# Patient Record
Sex: Female | Born: 1960
Health system: Southern US, Community
[De-identification: ages and names within clinical notes are randomized; demographics above are authoritative.]

## PROBLEM LIST (undated history)

## (undated) ENCOUNTER — Ambulatory Visit (HOSPITAL_COMMUNITY): Admission: EM | Payer: BLUE CROSS/BLUE SHIELD | Source: Home / Self Care

## (undated) ENCOUNTER — Ambulatory Visit (HOSPITAL_COMMUNITY): Discharge status: Absent without leave | Payer: BLUE CROSS/BLUE SHIELD

## (undated) DIAGNOSIS — I340 Nonrheumatic mitral (valve) insufficiency: Secondary | ICD-10-CM

## (undated) DIAGNOSIS — Z9119 Patient's noncompliance with other medical treatment and regimen: Secondary | ICD-10-CM

## (undated) DIAGNOSIS — I10 Essential (primary) hypertension: Secondary | ICD-10-CM

## (undated) DIAGNOSIS — Z91199 Patient's noncompliance with other medical treatment and regimen due to unspecified reason: Secondary | ICD-10-CM

## (undated) DIAGNOSIS — N6019 Diffuse cystic mastopathy of unspecified breast: Secondary | ICD-10-CM

## (undated) DIAGNOSIS — C9110 Chronic lymphocytic leukemia of B-cell type not having achieved remission: Secondary | ICD-10-CM

## (undated) DIAGNOSIS — D509 Iron deficiency anemia, unspecified: Secondary | ICD-10-CM

## (undated) DIAGNOSIS — I4819 Other persistent atrial fibrillation: Secondary | ICD-10-CM

## (undated) DIAGNOSIS — I4891 Unspecified atrial fibrillation: Secondary | ICD-10-CM

## (undated) DIAGNOSIS — E785 Hyperlipidemia, unspecified: Secondary | ICD-10-CM

## (undated) DIAGNOSIS — F32A Depression, unspecified: Secondary | ICD-10-CM

## (undated) DIAGNOSIS — F329 Major depressive disorder, single episode, unspecified: Secondary | ICD-10-CM

## (undated) DIAGNOSIS — R591 Generalized enlarged lymph nodes: Secondary | ICD-10-CM

## (undated) HISTORY — DX: Nonrheumatic mitral (valve) insufficiency: I34.0

## (undated) HISTORY — DX: Iron deficiency anemia, unspecified: D50.9

## (undated) HISTORY — DX: Diffuse cystic mastopathy of unspecified breast: N60.19

## (undated) HISTORY — DX: Patient's noncompliance with other medical treatment and regimen due to unspecified reason: Z91.199

## (undated) HISTORY — PX: TUBAL LIGATION: SHX77

## (undated) HISTORY — DX: Major depressive disorder, single episode, unspecified: F32.9

## (undated) HISTORY — DX: Depression, unspecified: F32.A

## (undated) HISTORY — DX: Generalized enlarged lymph nodes: R59.1

## (undated) HISTORY — DX: Hyperlipidemia, unspecified: E78.5

## (undated) HISTORY — DX: Patient's noncompliance with other medical treatment and regimen: Z91.19

---

## 1998-12-06 ENCOUNTER — Other Ambulatory Visit: Admission: RE | Admit: 1998-12-06 | Discharge: 1998-12-06 | Payer: Self-pay | Admitting: Obstetrics

## 1999-06-02 ENCOUNTER — Inpatient Hospital Stay (HOSPITAL_COMMUNITY): Admission: AD | Admit: 1999-06-02 | Discharge: 1999-06-04 | Payer: Self-pay | Admitting: Obstetrics

## 1999-07-08 ENCOUNTER — Ambulatory Visit (HOSPITAL_COMMUNITY): Admission: RE | Admit: 1999-07-08 | Discharge: 1999-07-08 | Payer: Self-pay | Admitting: Obstetrics

## 2004-01-18 ENCOUNTER — Inpatient Hospital Stay (HOSPITAL_COMMUNITY): Admission: AD | Admit: 2004-01-18 | Discharge: 2004-01-21 | Payer: Self-pay | Admitting: Obstetrics

## 2004-01-19 ENCOUNTER — Encounter (INDEPENDENT_AMBULATORY_CARE_PROVIDER_SITE_OTHER): Payer: Self-pay | Admitting: Specialist

## 2004-01-25 ENCOUNTER — Ambulatory Visit: Admission: RE | Admit: 2004-01-25 | Discharge: 2004-01-25 | Payer: Self-pay | Admitting: Obstetrics

## 2006-02-05 ENCOUNTER — Emergency Department (HOSPITAL_COMMUNITY): Admission: EM | Admit: 2006-02-05 | Discharge: 2006-02-05 | Payer: Self-pay | Admitting: Family Medicine

## 2006-08-25 ENCOUNTER — Encounter (HOSPITAL_BASED_OUTPATIENT_CLINIC_OR_DEPARTMENT_OTHER): Admission: RE | Admit: 2006-08-25 | Discharge: 2006-09-21 | Payer: Self-pay | Admitting: Surgery

## 2008-11-24 ENCOUNTER — Ambulatory Visit: Payer: Self-pay | Admitting: Oncology

## 2009-01-12 ENCOUNTER — Ambulatory Visit: Payer: Self-pay | Admitting: Oncology

## 2009-01-12 LAB — CBC & DIFF AND RETIC
BASO%: 0.5 % (ref 0.0–2.0)
HCT: 31.4 % — ABNORMAL LOW (ref 34.8–46.6)
HGB: 10.3 g/dL — ABNORMAL LOW (ref 11.6–15.9)
IRF: 0.3 (ref 0.130–0.330)
MCHC: 32.8 g/dL (ref 31.5–36.0)
MONO#: 0.5 10*3/uL (ref 0.1–0.9)
NEUT#: 3 10*3/uL (ref 1.5–6.5)
RBC: 4.07 10*6/uL (ref 3.70–5.45)
RDW: 27.2 % — ABNORMAL HIGH (ref 11.2–14.5)
Retic %: 1.4 % (ref 0.4–2.3)
lymph#: 7.5 10*3/uL — ABNORMAL HIGH (ref 0.9–3.3)

## 2009-01-12 LAB — COMPREHENSIVE METABOLIC PANEL
ALT: 13 U/L (ref 0–35)
AST: 18 U/L (ref 0–37)
Alkaline Phosphatase: 39 U/L (ref 39–117)
Calcium: 9 mg/dL (ref 8.4–10.5)
Chloride: 102 mEq/L (ref 96–112)
Creatinine, Ser: 0.64 mg/dL (ref 0.40–1.20)
Total Bilirubin: 0.6 mg/dL (ref 0.3–1.2)

## 2009-01-12 LAB — LACTATE DEHYDROGENASE: LDH: 117 U/L (ref 94–250)

## 2009-01-15 LAB — TRANSFERRIN RECEPTOR, SOLUABLE: Transferrin Receptor, Soluble: 45.6 nmol/L

## 2009-01-15 LAB — IRON AND TIBC
%SAT: 19 % — ABNORMAL LOW (ref 20–55)
TIBC: 309 ug/dL (ref 250–470)
UIBC: 250 ug/dL

## 2009-02-16 LAB — CBC WITH DIFFERENTIAL/PLATELET
BASO%: 0.5 % (ref 0.0–2.0)
Basophils Absolute: 0.1 10*3/uL (ref 0.0–0.1)
EOS%: 1.2 % (ref 0.0–7.0)
Eosinophils Absolute: 0.1 10*3/uL (ref 0.0–0.5)
HCT: 33.6 % — ABNORMAL LOW (ref 34.8–46.6)
HGB: 11.2 g/dL — ABNORMAL LOW (ref 11.6–15.9)
LYMPH%: 67.3 % — ABNORMAL HIGH (ref 14.0–49.7)
MCH: 27.8 pg (ref 25.1–34.0)
MCHC: 33.3 g/dL (ref 31.5–36.0)
MCV: 83.4 fL (ref 79.5–101.0)
MONO#: 0.6 10*3/uL (ref 0.1–0.9)
MONO%: 4.8 % (ref 0.0–14.0)
NEUT#: 3.1 10*3/uL (ref 1.5–6.5)
NEUT%: 26.2 % — ABNORMAL LOW (ref 38.4–76.8)
Platelets: 271 10*3/uL (ref 145–400)
RBC: 4.03 10*6/uL (ref 3.70–5.45)
RDW: 19.3 % — ABNORMAL HIGH (ref 11.2–14.5)
WBC: 11.8 10*3/uL — ABNORMAL HIGH (ref 3.9–10.3)
lymph#: 7.9 10*3/uL — ABNORMAL HIGH (ref 0.9–3.3)

## 2009-03-14 ENCOUNTER — Ambulatory Visit: Payer: Self-pay | Admitting: Oncology

## 2009-03-16 LAB — CBC WITH DIFFERENTIAL/PLATELET
BASO%: 0.5 % (ref 0.0–2.0)
Eosinophils Absolute: 0.1 10*3/uL (ref 0.0–0.5)
HCT: 34.7 % — ABNORMAL LOW (ref 34.8–46.6)
LYMPH%: 62.6 % — ABNORMAL HIGH (ref 14.0–49.7)
MONO#: 1.6 10*3/uL — ABNORMAL HIGH (ref 0.1–0.9)
NEUT#: 2.9 10*3/uL (ref 1.5–6.5)
NEUT%: 23 % — ABNORMAL LOW (ref 38.4–76.8)
Platelets: 239 10*3/uL (ref 145–400)
WBC: 12.5 10*3/uL — ABNORMAL HIGH (ref 3.9–10.3)
lymph#: 7.8 10*3/uL — ABNORMAL HIGH (ref 0.9–3.3)

## 2009-04-13 ENCOUNTER — Ambulatory Visit: Payer: Self-pay | Admitting: Oncology

## 2009-04-13 LAB — CBC WITH DIFFERENTIAL/PLATELET
BASO%: 0.5 % (ref 0.0–2.0)
EOS%: 1 % (ref 0.0–7.0)
Eosinophils Absolute: 0.1 10*3/uL (ref 0.0–0.5)
LYMPH%: 69.9 % — ABNORMAL HIGH (ref 14.0–49.7)
MCH: 29.8 pg (ref 25.1–34.0)
MCHC: 34.3 g/dL (ref 31.5–36.0)
MCV: 86.8 fL (ref 79.5–101.0)
MONO%: 2.1 % (ref 0.0–14.0)
Platelets: 274 10*3/uL (ref 145–400)
RBC: 3.91 10*6/uL (ref 3.70–5.45)
RDW: 13.5 % (ref 11.2–14.5)

## 2009-04-13 LAB — COMPREHENSIVE METABOLIC PANEL
AST: 18 U/L (ref 0–37)
Alkaline Phosphatase: 30 U/L — ABNORMAL LOW (ref 39–117)
Glucose, Bld: 99 mg/dL (ref 70–99)
Potassium: 3.3 mEq/L — ABNORMAL LOW (ref 3.5–5.3)
Sodium: 135 mEq/L (ref 135–145)
Total Bilirubin: 0.9 mg/dL (ref 0.3–1.2)
Total Protein: 7.1 g/dL (ref 6.0–8.3)

## 2009-04-13 LAB — FERRITIN: Ferritin: 28 ng/mL (ref 10–291)

## 2009-04-13 LAB — IRON AND TIBC: %SAT: 24 % (ref 20–55)

## 2009-06-12 ENCOUNTER — Ambulatory Visit: Payer: Self-pay | Admitting: Oncology

## 2009-06-14 LAB — CBC WITH DIFFERENTIAL/PLATELET
Basophils Absolute: 0.1 10*3/uL (ref 0.0–0.1)
EOS%: 0.8 % (ref 0.0–7.0)
Eosinophils Absolute: 0.1 10*3/uL (ref 0.0–0.5)
HGB: 11 g/dL — ABNORMAL LOW (ref 11.6–15.9)
LYMPH%: 66.4 % — ABNORMAL HIGH (ref 14.0–49.7)
MCH: 29.7 pg (ref 25.1–34.0)
MCV: 88.5 fL (ref 79.5–101.0)
MONO%: 4.6 % (ref 0.0–14.0)
NEUT#: 3.8 10*3/uL (ref 1.5–6.5)
NEUT%: 27.7 % — ABNORMAL LOW (ref 38.4–76.8)
Platelets: 273 10*3/uL (ref 145–400)
RDW: 13.4 % (ref 11.2–14.5)

## 2009-08-10 ENCOUNTER — Ambulatory Visit: Payer: Self-pay | Admitting: Oncology

## 2009-08-14 LAB — LACTATE DEHYDROGENASE: LDH: 161 U/L (ref 94–250)

## 2009-08-14 LAB — CBC WITH DIFFERENTIAL/PLATELET
Basophils Absolute: 0.1 10*3/uL (ref 0.0–0.1)
Eosinophils Absolute: 0.1 10*3/uL (ref 0.0–0.5)
HCT: 39 % (ref 34.8–46.6)
HGB: 13 g/dL (ref 11.6–15.9)
LYMPH%: 75.4 % — ABNORMAL HIGH (ref 14.0–49.7)
MCV: 87.7 fL (ref 79.5–101.0)
MONO#: 0.7 10*3/uL (ref 0.1–0.9)
MONO%: 3.9 % (ref 0.0–14.0)
NEUT#: 3.7 10*3/uL (ref 1.5–6.5)
Platelets: 313 10*3/uL (ref 145–400)
WBC: 18.9 10*3/uL — ABNORMAL HIGH (ref 3.9–10.3)

## 2009-08-14 LAB — COMPREHENSIVE METABOLIC PANEL
Albumin: 4.3 g/dL (ref 3.5–5.2)
Alkaline Phosphatase: 36 U/L — ABNORMAL LOW (ref 39–117)
BUN: 10 mg/dL (ref 6–23)
CO2: 25 mEq/L (ref 19–32)
Glucose, Bld: 99 mg/dL (ref 70–99)
Total Bilirubin: 0.7 mg/dL (ref 0.3–1.2)
Total Protein: 7.5 g/dL (ref 6.0–8.3)

## 2009-08-15 LAB — IRON AND TIBC
%SAT: 27 % (ref 20–55)
Iron: 84 ug/dL (ref 42–145)
TIBC: 315 ug/dL (ref 250–470)

## 2009-08-15 LAB — FERRITIN: Ferritin: 20 ng/mL (ref 10–291)

## 2009-10-11 ENCOUNTER — Ambulatory Visit: Payer: Self-pay | Admitting: Oncology

## 2009-10-15 LAB — CBC WITH DIFFERENTIAL/PLATELET
Basophils Absolute: 0.1 10*3/uL (ref 0.0–0.1)
Eosinophils Absolute: 0.1 10*3/uL (ref 0.0–0.5)
HCT: 34.4 % — ABNORMAL LOW (ref 34.8–46.6)
HGB: 11.3 g/dL — ABNORMAL LOW (ref 11.6–15.9)
LYMPH%: 74.4 % — ABNORMAL HIGH (ref 14.0–49.7)
MCV: 88.7 fL (ref 79.5–101.0)
MONO#: 0.5 10*3/uL (ref 0.1–0.9)
MONO%: 3.2 % (ref 0.0–14.0)
NEUT#: 3.3 10*3/uL (ref 1.5–6.5)
Platelets: 285 10*3/uL (ref 145–400)
RBC: 3.88 10*6/uL (ref 3.70–5.45)
WBC: 15.7 10*3/uL — ABNORMAL HIGH (ref 3.9–10.3)

## 2009-10-15 LAB — TECHNOLOGIST REVIEW

## 2009-10-15 LAB — BASIC METABOLIC PANEL
CO2: 27 mEq/L (ref 19–32)
Chloride: 107 mEq/L (ref 96–112)
Creatinine, Ser: 0.72 mg/dL (ref 0.40–1.20)
Glucose, Bld: 111 mg/dL — ABNORMAL HIGH (ref 70–99)

## 2009-12-11 ENCOUNTER — Ambulatory Visit: Payer: Self-pay | Admitting: Oncology

## 2009-12-13 LAB — CBC WITH DIFFERENTIAL/PLATELET
BASO%: 2.9 % — ABNORMAL HIGH (ref 0.0–2.0)
Basophils Absolute: 0.6 10*3/uL — ABNORMAL HIGH (ref 0.0–0.1)
EOS%: 0.5 % (ref 0.0–7.0)
HCT: 36 % (ref 34.8–46.6)
HGB: 11.9 g/dL (ref 11.6–15.9)
MCH: 29.1 pg (ref 25.1–34.0)
MCHC: 33.1 g/dL (ref 31.5–36.0)
MCV: 88 fL (ref 79.5–101.0)
MONO%: 3.1 % (ref 0.0–14.0)
NEUT%: 15.6 % — ABNORMAL LOW (ref 38.4–76.8)
RDW: 13.8 % (ref 11.2–14.5)

## 2009-12-14 LAB — COMPREHENSIVE METABOLIC PANEL
AST: 15 U/L (ref 0–37)
Alkaline Phosphatase: 39 U/L (ref 39–117)
BUN: 9 mg/dL (ref 6–23)
Creatinine, Ser: 0.71 mg/dL (ref 0.40–1.20)

## 2009-12-14 LAB — IRON AND TIBC
%SAT: 16 % — ABNORMAL LOW (ref 20–55)
Iron: 55 ug/dL (ref 42–145)
TIBC: 350 ug/dL (ref 250–470)
UIBC: 295 ug/dL

## 2009-12-14 LAB — TRANSFERRIN RECEPTOR, SOLUABLE: Transferrin Receptor, Soluble: 53.5 nmol/L

## 2010-06-11 ENCOUNTER — Ambulatory Visit: Payer: Self-pay | Admitting: Oncology

## 2011-02-21 NOTE — Op Note (Signed)
NAME:  Audrey Gross, Audrey Gross                           ACCOUNT NO.:  [id number]   MEDICAL RECORD NO.:  [id number]                   PATIENT TYPE:  INP   LOCATION:  9371                                 FACILITY:  [facility]   PHYSICIAN:  Kathreen Cosier, M.D.           DATE OF BIRTH:  09-19-1961   DATE OF PROCEDURE:  01/19/2004  DATE OF DISCHARGE:                                 OPERATIVE REPORT   PREOPERATIVE DIAGNOSIS:  Multiparity.   PROCEDURE:  Postpartum tubal ligation.   ANESTHESIA:  General anesthesia.   SURGEON:  Kathreen Cosier, M.D.   DESCRIPTION OF PROCEDURE:  Under general anesthesia, patient in the supine  position, abdomen prepped and draped, bladder emptied with a straight  catheter.  Midline subumbilical incision made and carried down to the  fascia.  Fascia cleaned and grasped with two Kochers in the fascia and the  peritoneum opened with Mayo scissors.  Left tube grasped in the mid portion  with Babcock clamp.  A 0 plain suture placed in the mesosalpinx below the  portion of tube in the clamp.  This was tied and approximately one inch of  tube transected.  Hemostasis was satisfactory.  Procedure done in similar  fashion on the other side.  The abdomen closed in layers.  Peritoneum and  fascia continuous suture of 0 Dexon and the skin closed with subcuticular  suture of 3-0 plain.  Blood loss less than 5 mL.                                               Kathreen Cosier, M.D.    BAM/MEDQ  D:  01/19/2004  T:  01/22/2004  Job:  [id number]

## 2011-02-21 NOTE — Assessment & Plan Note (Signed)
[facility]   [name], Audrey Gross NO.:  [id number]   MEDICAL RECORD NO.:  [id number]          DATE OF BIRTH:   PHYSICIAN:  Theresia Majors. Tanda Rockers, M.D. VISIT DATE:  09/08/2006                                   OFFICE VISIT   VITAL SIGNS:  Blood pressure is 130/80, respirations 20, pulse rate 76  and she is afebrile.   PURPOSE OF TODAY'S VISIT:  [name] is a 50 year old lady who  represents for followup a left elbow ulceration associated with a MRSA  infection.  In the interim, the patient has been treated with topical  antiseptics and serial exams.  In the interim, she reports there has  been no fever, no cellulitis and no tenderness.   WOUND EXAM:  Inspection of the wound shows that there is 99%  reepithelization.  The wound was cleansed with topical Anasept solution.   DIAGNOSIS:  Resolved wound.   MANAGEMENT PLAN & GOAL:  We are discharging the patient.  Her interim  cultures showed no growth at 2 days.  We have instructed her in the use  of antiseptic soaps and the application of interim dry dressing.  If she  develops progressive drainage, pain or fever, she will call the wound  center for a priority evaluation; otherwise, we anticipate complete  resolution.  We are, therefore, discharging her from the clinic.           ______________________________  Theresia Majors. Tanda Rockers, M.D.     [name]  D:  09/08/2006  T:  09/09/2006  Job:  [id number]   cc:   [name]

## 2011-02-21 NOTE — Discharge Summary (Signed)
NAME:  Audrey Gross, Audrey Gross                           ACCOUNT NO.:  [id number]   MEDICAL RECORD NO.:  [id number]                   PATIENT TYPE:  INP   LOCATION:  9143                                 FACILITY:  [facility]   PHYSICIAN:  Kathreen Cosier, M.D.           DATE OF BIRTH:  [birth year]   DATE OF ADMISSION:  01/18/2004  DATE OF DISCHARGE:  01/21/2004                                 DISCHARGE SUMMARY   HOSPITAL COURSE:  The patient is a 50 year old gravida 6 para 5-0-0-5 with  EDC of Feb 13, 2004 who was seen for a regular visit in the office on January 18, 2004 and her blood pressure ranged between 160/90 to 197/98.  Her uric  acid was 5.8, platelets 300+.  She was brought in for induction because of  PIH.  Her cervix was 1 cm, 80%, vertex, -2 to -3.  She was started on  magnesium sulfate 4 g loading, 2 g per hour.  She had a positive GBS which  was treated with penicillin.  The patient received Pitocin induction and had  a normal spontaneous delivery of a female, Apgars 9 and 9.  The placenta was  spontaneous, intact, with a nuchal cord x1.  The patient was kept on  magnesium sulfate postpartum for 24 hours and her uric acid fell to 5.3.  She desired sterilization and on April 5 underwent postpartum tubal  ligation.  Postoperative delivery she was started on hydrochlorothiazide on  day #2 50 mg p.o. daily and did well.  On the morning of admission blood  pressure was 150/88, fundus firm, legs negative, no complaints.  She was  discharged on Tylenol No. 3 and hydrochlorothiazide 50 mg p.o. daily in  a.m., to see me on Tuesday morning after 9 a.m. for blood pressure check.   DISCHARGE DIAGNOSES:  1. Status post pregnancy-induced hypertension.  2. Thirty-six-weeks induction of labor.  3. Normal vaginal delivery.  4. Postpartum tubal ligation.                                               Kathreen Cosier, M.D.    BAM/MEDQ  D:  01/21/2004  T:  01/22/2004  Job:  [id number]

## 2011-10-09 ENCOUNTER — Emergency Department (INDEPENDENT_AMBULATORY_CARE_PROVIDER_SITE_OTHER)
Admission: EM | Admit: 2011-10-09 | Discharge: 2011-10-09 | Disposition: A | Discharge status: Home / Self Care | Payer: BC Managed Care – PPO | Source: Home / Self Care | Attending: Emergency Medicine | Admitting: Emergency Medicine

## 2011-10-09 ENCOUNTER — Encounter: Payer: Self-pay | Admitting: *Deleted

## 2011-10-09 DIAGNOSIS — N63 Unspecified lump in unspecified breast: Secondary | ICD-10-CM

## 2011-10-09 HISTORY — DX: Chronic lymphocytic leukemia of B-cell type not having achieved remission: C91.10

## 2011-10-09 HISTORY — DX: Essential (primary) hypertension: I10

## 2011-10-09 NOTE — ED Provider Notes (Signed)
History     CSN: [id number]  Arrival date & time 10/09/11  7829   First MD Initiated Contact with Patient 10/09/11 1001      Chief Complaint  Patient presents with  . Breast Mass    (Consider location/radiation/quality/duration/timing/severity/associated sxs/prior treatment) HPI Comments: Audrey Gross discovered a lump in her left breast to 3 days ago. This was nontender. There was no discharge from the nipple. She's not had any prior history of breast lumps or cysts. She denies any personal or family history of breast cancer and she does have chronic lymphocytic leukemia and high blood pressure.   Past Medical History  Diagnosis Date  . Hypertension   . CLL (chronic lymphoblastic leukemia)     History reviewed. No pertinent past surgical history.  History reviewed. No pertinent family history.  History  Substance Use Topics  . Smoking status: Not on file  . Smokeless tobacco: Not on file  . Alcohol Use:     OB History    Grav Para Term Preterm Abortions TAB SAB Ect Mult Living                  Review of Systems  Constitutional: Negative for fever and chills.  Skin: Negative for color change and rash.    Allergies  Review of patient's allergies indicates not on file.  Home Medications   Current Outpatient Rx  Name Route Sig Dispense Refill  . LISINOPRIL-HYDROCHLOROTHIAZIDE 20-12.5 MG PO TABS Oral Take 1 tablet by mouth daily.        BP 203/93  Pulse 82  Temp(Src) 98.5 F (36.9 C) (Oral)  Resp 18  SpO2 100%  Physical Exam  Constitutional: She appears well-developed and well-nourished.  Cardiovascular: Normal rate, regular rhythm, normal heart sounds and intact distal pulses.   Pulmonary/Chest: Effort normal and breath sounds normal.       Breast exam reveals a 3 x 4 cm mass at the 12:00 position in the left breast. This is freely movable. There is no overlying skin changes, no nipple discharge or bleeding. No axillary lymphadenopathy.  Abdominal: Soft. Bowel  sounds are normal.  Skin: Skin is warm and dry. No rash noted.    ED Course  Procedures (including critical care time)  Patient needs to have an immediate nonstick mammogram. The [facility] was willing to do this today, but they need a written authorization from a physician, so this was supplied to the patient. She will go there and have this done. They will take care of further followup thereafter.  Labs Reviewed - No data to display No results found.   1. Breast lump       MDM  She has a breast lump of uncertain significance. This may be a cyst. She will have a diagnostic mammogram done today.        Roque Lias, MD 10/09/11 1340

## 2011-10-09 NOTE — ED Notes (Signed)
Pt  Noticed  A  Lump  Top  Of  l  Breast  2-3  Days  Ago    She  Reports  It is  Not  painfull        She  denys  Any other  Symptoms

## 2011-10-16 ENCOUNTER — Encounter: Payer: Self-pay | Admitting: Oncology

## 2011-11-18 ENCOUNTER — Telehealth: Payer: Self-pay | Admitting: Oncology

## 2011-11-18 NOTE — Telephone Encounter (Signed)
pt had lm to r/s 06/2010 appt,called and lm with a person and they will have her c/b  aom

## 2011-11-20 ENCOUNTER — Other Ambulatory Visit: Payer: Self-pay

## 2011-11-20 ENCOUNTER — Telehealth: Payer: Self-pay | Admitting: Oncology

## 2011-11-20 ENCOUNTER — Telehealth: Payer: Self-pay

## 2011-11-20 DIAGNOSIS — D649 Anemia, unspecified: Secondary | ICD-10-CM

## 2011-11-20 DIAGNOSIS — C9110 Chronic lymphocytic leukemia of B-cell type not having achieved remission: Secondary | ICD-10-CM

## 2011-11-20 NOTE — Telephone Encounter (Signed)
l/m with 01/05/12 appt per kb      aom

## 2011-11-20 NOTE — Telephone Encounter (Signed)
Pt called to schedule appt w/DSM.  "I need to get back on track" She saw an MD at [facility] that had diagnosed a cyst in her breast. Report from [facility] obtained and forwarded to DSM. POF sent to scheduler for early April appt.

## 2012-01-05 ENCOUNTER — Ambulatory Visit (HOSPITAL_BASED_OUTPATIENT_CLINIC_OR_DEPARTMENT_OTHER): Payer: BC Managed Care – PPO

## 2012-01-05 ENCOUNTER — Other Ambulatory Visit: Payer: BC Managed Care – PPO | Admitting: Lab

## 2012-01-05 ENCOUNTER — Telehealth: Payer: Self-pay | Admitting: Oncology

## 2012-01-05 ENCOUNTER — Encounter: Payer: Self-pay | Admitting: Gastroenterology

## 2012-01-05 ENCOUNTER — Encounter: Payer: Self-pay | Admitting: Oncology

## 2012-01-05 ENCOUNTER — Ambulatory Visit: Payer: BC Managed Care – PPO | Admitting: Oncology

## 2012-01-05 VITALS — BP 164/92 | HR 74 | Temp 97.9°F | Ht 67.5 in | Wt 162.6 lb

## 2012-01-05 DIAGNOSIS — C9110 Chronic lymphocytic leukemia of B-cell type not having achieved remission: Secondary | ICD-10-CM

## 2012-01-05 DIAGNOSIS — F329 Major depressive disorder, single episode, unspecified: Secondary | ICD-10-CM

## 2012-01-05 DIAGNOSIS — D649 Anemia, unspecified: Secondary | ICD-10-CM

## 2012-01-05 DIAGNOSIS — N6019 Diffuse cystic mastopathy of unspecified breast: Secondary | ICD-10-CM

## 2012-01-05 DIAGNOSIS — D509 Iron deficiency anemia, unspecified: Secondary | ICD-10-CM | POA: Insufficient documentation

## 2012-01-05 DIAGNOSIS — D3A020 Benign carcinoid tumor of the appendix: Secondary | ICD-10-CM

## 2012-01-05 LAB — CBC WITH DIFFERENTIAL/PLATELET
BASO%: 0.8 % (ref 0.0–2.0)
LYMPH%: 76.8 % — ABNORMAL HIGH (ref 14.0–49.7)
MCHC: 29.8 g/dL — ABNORMAL LOW (ref 31.5–36.0)
MONO#: 0.5 10*3/uL (ref 0.1–0.9)
RBC: 4.08 10*6/uL (ref 3.70–5.45)
RDW: 21.1 % — ABNORMAL HIGH (ref 11.2–14.5)
WBC: 18.6 10*3/uL — ABNORMAL HIGH (ref 3.9–10.3)
lymph#: 14.3 10*3/uL — ABNORMAL HIGH (ref 0.9–3.3)
nRBC: 0 % (ref 0–0)

## 2012-01-05 MED ORDER — SODIUM CHLORIDE 0.9 % IV SOLN
Freq: Once | INTRAVENOUS | Status: AC
  Administered 2012-01-05: 100 mL via INTRAVENOUS

## 2012-01-05 MED ORDER — FERUMOXYTOL INJECTION 510 MG/17 ML
510.0000 mg | Freq: Once | INTRAVENOUS | Status: AC
  Administered 2012-01-05: 510 mg via INTRAVENOUS
  Filled 2012-01-05: qty 17

## 2012-01-05 MED ORDER — SODIUM CHLORIDE 0.9 % IJ SOLN
10.0000 mL | INTRAMUSCULAR | Status: DC | PRN
  Filled 2012-01-05: qty 10

## 2012-01-05 NOTE — Telephone Encounter (Signed)
Gv pt appt for [id number].  scheduled appt with Dr. Christella Hartigan for 01/28/2012 @ 10:30am @ [facility]

## 2012-01-05 NOTE — Progress Notes (Signed)
CC:   [facility]  PROBLEM LIST: 1. Chronic lymphocytic leukemia first diagnosed in February 2010,     stage 0.  No prior history of treatment. 2. Iron-deficiency anemia which the patient states she has had for     years.  When we first saw the patient 3 years ago in April 2010,     her ferritin was 3, her hemoglobin was 10.3, and she responded to     oral iron. 3. Long history of hypertension. 4. History of breast cysts.  The patient had a mammogram at [facility] on 10/09/2011 with apparently a cyst aspiration.  I     cannot find a cytology for that aspiration. 5. Recent history of depression. 6. Systolic ejection murmur. 7. Poor medical compliance.  MEDICATIONS:  Zestoretic 20/12.5 mg daily.  Compliance on this medicine is questionable.  HISTORY:  Audrey Gross is a 51 year old African American female who is here today with her husband Audrey Gross.  The patient was first seen by [place] on 01/12/2009 as a referral for chronic lymphocytic leukemia and iron- deficiency anemia.  I should mention that she did have the flow studies carried out on the peripheral blood when we first saw her.  C5, CD20, and CD23 were positive.  CD10 was negative.  Stools at that time were negative.  The patient had pagophagia.  The patient was last seen by [place] 2 years ago on December 13, 2009.  She was supposed to come back and see [place] in 6 months but was lost to our followup.  Somehow, the patient has reappeared.  Her major complaint is feeling very depressed.  She describes her feelings as having feelings of hopeless.  She feels that she has been depressed for a couple years, but these feelings are getting worse.  She is tired.  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  The patient is not on iron.  She denies any obvious blood loss.  Stools are apparently dark from time to time.  There is no obvious blood.  Her periods in the past have been described as normal.   She said she has not had a period in the last couple of months.  There has been no obvious bleeding, blood loss, or blood transfusions.  She says she has been off and on iron for a while.  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  PHYSICAL EXAM:  General:  The patient has very flat affect, but at times does smile.  Vital Signs:  Weight is 162.6 pounds, height 5 feet 7-1/2 inches, body surface area 1.87 sq m.  Blood pressure today 164/92.  The patient states she did take her medicine.  It is not clear to me how she is getting this refilled.  Originally, she had seen Jessica Copland.  She apparently had seen Dr. Creola Corn but says that he is not on her preferred provider list.  I do not believe she has seen any other doctor for quite some time.  Other vital signs are normal.  HEENT: There is no scleral icterus.  Mouth and pharynx benign.  Lymphatic:  No peripheral adenopathy palpable in the neck, supraclavicular, axillary, or inguinal areas.  Lungs:  Clear to percussion and auscultation. Cardiac Exam:  Regular rhythm with systolic ejection murmur which we had noted previously.  I offered to examine the patient's breasts in view of the breast  cysts; however, she declined.  She said she will seek followup at [facility].  Abdomen:  Benign with no organomegaly or masses palpable.  She has had a tubal ligation in the past. Extremities:  No peripheral edema or clubbing, palmar erythema, petechiae, or purpura.  Neurologic Exam:  Grossly normal.  LABORATORY DATA:  (Which was given to the patient and her husband): White count 18.6 which is stable, ANC 3.4, hemoglobin 8.1, hematocrit 27.3, platelets 339,000.  MCV 66.9, MCH 19.9.  On 12/13/2009, the patient's last visit here, white count was 19.4, ANC 3.0, hemoglobin 11.9, hematocrit 36.0, platelets 318,000.  MCV and MCH were 88.0 and 29.1 respectively.  Differential today:  18% neutrophils  77% lymphocytes.  Chemistries and iron studies today are pending. Chemistries from 12/13/2009 were normal including an LDH of 204, albumin 4.8.  Ferritin on 12/13/2009 was 9 as compared with 20 back on 08/14/2009.  The patient apparently had never received intravenous iron before.  IMAGING STUDIES:  The only imaging study that we have available is mammogram from 10/09/2011 which reads as follows:  Breast tissue is heterogeneously dense.  There is a palpable area in the superior left breast that was marked with a metallic BB.  Multiple bilateral benign- looking obscured masses were seen.  There was scattered benign-looking calcifications seen bilaterally more on the right.  No suspicious calcifications or architectural distortion.  On ultrasound, the concerning area which is located at the 12 o'clock position in the left breast.  There were 2 adjacent simple cysts seen measuring 3.5 cm in maximum diameter.  Multiple cysts were seen in both breasts.  There were no suspicions looking masses, abnormal shadowing, or other features suggestive for malignancy and a yearly mammogram was recommended.  It does appear that the patient underwent a cyst aspiration on 10/09/2011; however, I do not see a pathology report for that.  IMPRESSION AND PLAN: 1. The patient has several problems as outlined above.  The patient     has requested intravenous iron.  She will receive IV Feraheme today     510 mg IV.  I have suggested that she start taking iron in the form     of ferrous sulfate 2-3 daily and continue that until we see her     again which will be 2 months from now.  At that time, we will check     CBC, chemistries, and iron studies. 2. Referral to [facility] will be made in view of the patient's     recurrent iron deficiency and lack of good explanation for this.     The patient does not give a convincing history for menstrual     bleeding.  In addition, she is 13 and has never had a  colonoscopy. 3. We are trying to address the patient's depression and try to make     an [facility]  She may need to see a psychiatrist or internist for     antidepressant medicines and followup. 4. In addition, the patient needs followup for her hypertension.  Her     blood pressure today was elevated 164/92 and the last time she was     seen 2 years ago her blood pressure was 194/99.          Today's visit was rather extended and addressed several problems.  DURATION FOR VISIT:  50 minutes.    ______________________________ Gerarda Fraction  Marcellous Snarski, M.D. DSM/MEDQ  D:  01/05/2012  T:  01/05/2012  Job:  [id number]

## 2012-01-05 NOTE — Progress Notes (Signed)
This office note has been dictated.  #[id number]

## 2012-01-12 LAB — COMPREHENSIVE METABOLIC PANEL
ALT: 12 U/L (ref 0–35)
AST: 15 U/L (ref 0–37)
Calcium: 9.6 mg/dL (ref 8.4–10.5)
Chloride: 107 mEq/L (ref 96–112)
Creatinine, Ser: 0.82 mg/dL (ref 0.50–1.10)
Potassium: 3.9 mEq/L (ref 3.5–5.3)
Sodium: 138 mEq/L (ref 135–145)

## 2012-01-12 LAB — IRON AND TIBC: %SAT: 5 % — ABNORMAL LOW (ref 20–55)

## 2012-01-15 ENCOUNTER — Ambulatory Visit (INDEPENDENT_AMBULATORY_CARE_PROVIDER_SITE_OTHER): Payer: BC Managed Care – PPO | Admitting: Psychiatry

## 2012-01-15 DIAGNOSIS — F0630 Mood disorder due to known physiological condition, unspecified: Secondary | ICD-10-CM

## 2012-01-15 NOTE — Progress Notes (Incomplete)
01-14-2012  Patient seen for initial psychological evaluation.  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  She also is struggling with marital issues and possible divorce.  Recommended the patient consider antidepressants to help her mood and suggested she discuss her condition more fully with Dr. Arline Asp because she has gained information on the internet that causes her to believe her situation is hopeless.  Will consult with Dr. Arline Asp.  Patient's next appointment is 01-27-2012.

## 2012-01-27 ENCOUNTER — Ambulatory Visit (INDEPENDENT_AMBULATORY_CARE_PROVIDER_SITE_OTHER): Payer: BC Managed Care – PPO | Admitting: Psychiatry

## 2012-01-27 DIAGNOSIS — F0630 Mood disorder due to known physiological condition, unspecified: Secondary | ICD-10-CM

## 2012-01-27 NOTE — Progress Notes (Signed)
01-27-2012  Patient seen for individual psychotherapy.  Patient was re-assured about her prognosis per Dr. Mamie Levers opinion that she was in a "wait and see" stage of leukemia and in no immediate risk of demise.  Patient continues to struggle with this optimism.  Encouraged her to utilize the eight coping strategies and focus on caring for self to increase her longevity.  Patient struggling with possible end of marriage issues.  Next appointment 02-17-2012.

## 2012-01-28 ENCOUNTER — Encounter: Payer: Self-pay | Admitting: Gastroenterology

## 2012-01-28 ENCOUNTER — Ambulatory Visit (INDEPENDENT_AMBULATORY_CARE_PROVIDER_SITE_OTHER): Payer: BC Managed Care – PPO | Admitting: Gastroenterology

## 2012-01-28 ENCOUNTER — Other Ambulatory Visit: Payer: BC Managed Care – PPO

## 2012-01-28 VITALS — BP 182/100 | HR 80 | Ht 67.0 in | Wt 167.0 lb

## 2012-01-28 DIAGNOSIS — D509 Iron deficiency anemia, unspecified: Secondary | ICD-10-CM

## 2012-01-28 NOTE — Progress Notes (Signed)
HPI: This is a   very pleasant 51 year old woman who was sent for the evaluation of iron deficiency anemia.  She is not a vegetarian.  Iron level low for a long time, many times.  SHe still has periods, "heavy."  Changes pad every 2 hours for 3 days. Has 6-7 days of bleeding.   This is her pattern of menstral bleeding her whole life.    She has not seen gyne in years.  No overt gi bleeding.  She has never had colon cancer screening.  No colon cancer in family but aunt and a grandmother both had stomach cancer.  Loss of weight recently, but she is gaining it back.   Blood work last month: Ferritin 3, hemoglobin 8.8 with MCV in the 60s. Iron level low.   Review of systems: Pertinent positive and negative review of systems were noted in the above HPI section. Complete review of systems was performed and was otherwise normal.    Past Medical History  Diagnosis Date  . Hypertension   . CLL (chronic lymphoblastic leukemia)   . Depression   . Iron deficiency anemia, unspecified   . Diffuse cystic mastopathy   . Cystic breast   . Systolic ejection murmur   . Medical non-compliance     Past Surgical History  Procedure Date  . Tubal ligation     Current Outpatient Prescriptions  Medication Sig Dispense Refill  . ferrous sulfate 325 (65 FE) MG tablet Take 325 mg by mouth daily with breakfast.      . lisinopril-hydrochlorothiazide (PRINZIDE,ZESTORETIC) 20-12.5 MG per tablet Take 1 tablet by mouth daily.          Allergies as of 01/28/2012  . (No Known Allergies)    Family History  Problem Relation Age of Onset  . Kidney disease Father   . Stomach cancer Maternal Aunt     History   Social History  . Marital Status: Legally Separated    Spouse Name: N/A    Number of Children: 6  . Years of Education: N/A   Occupational History  . Inspector    Social History Main Topics  . Smoking status: Never Smoker   . Smokeless tobacco: Never Used  . Alcohol Use: No  .  Drug Use: No  . Sexually Active: Not on file   Other Topics Concern  . Not on file   Social History Narrative  . No narrative on file       Physical Exam: BP 182/100  Pulse 80  Ht 5\' 7"  (1.702 m)  Wt 167 lb (75.751 kg)  BMI 26.16 kg/m2  LMP 01/12/2012 Constitutional: generally well-appearing Psychiatric: alert and oriented x3 Eyes: extraocular movements intact Mouth: oral pharynx moist, no lesions Neck: supple no lymphadenopathy Cardiovascular: heart regular rate and rhythm Lungs: clear to auscultation bilaterally Abdomen: soft, nontender, nondistended, no obvious ascites, no peritoneal signs, normal bowel sounds Extremities: no lower extremity edema bilaterally Skin: no lesions on visible extremities    Assessment and plan: 51 y.o. female with  iron deficiency anemia, heavy menstrual bleeding  Her heavy menstrual bleeding can certainly cause her chronic iron deficiency anemia. She has never had colon cancer screening and we will arrange a colonoscopy at her soonest convenience. She is going to get blood work today to check for celiac sprue and if the blood work is suggestive then she will have an EGD at the same time as her colonoscopy. Celiac sprue can also cause iron deficiency anemia. If the GI  workup is unrevealing then I would likely recommend she see her gynecologist for her heavy menstrual bleeding.

## 2012-01-28 NOTE — Patient Instructions (Addendum)
You will have labs checked today in the basement lab.  Please head down after you check out with the front desk  (celiac sprue panel). You will be set up for a colonoscopy but will  wait to schedule this until after the blood work as above. If the blood work above suggests that you have celiac sprue then you should have upper endoscopy, EGD at the same time as the colonoscopy. You have pretty heavy menstral bleeding, this may account account for your low iron levels. If the above tests are unrevealing, then you will be referred to gynecology. A copy of this information will be made available to Dr. Arline Asp. Continue oral iron daily.

## 2012-01-29 LAB — CELIAC PANEL 10
Endomysial Screen: NEGATIVE
Tissue Transglutaminase Ab, IgA: 3.8 U/mL (ref <20)

## 2012-01-30 ENCOUNTER — Encounter: Payer: Self-pay | Admitting: Gastroenterology

## 2012-02-17 ENCOUNTER — Ambulatory Visit: Payer: BC Managed Care – PPO | Admitting: Psychiatry

## 2012-03-12 ENCOUNTER — Encounter: Payer: Self-pay | Admitting: *Deleted

## 2012-03-12 ENCOUNTER — Telehealth: Payer: Self-pay | Admitting: *Deleted

## 2012-03-12 NOTE — Telephone Encounter (Signed)
Message given to husband for pt. To call back to r/s pv before 5pm or procedure will be cx. and she will need to r/s.

## 2012-03-12 NOTE — Telephone Encounter (Signed)
No show for pv

## 2012-03-16 ENCOUNTER — Encounter: Payer: Self-pay | Admitting: Gastroenterology

## 2012-03-18 ENCOUNTER — Other Ambulatory Visit (HOSPITAL_BASED_OUTPATIENT_CLINIC_OR_DEPARTMENT_OTHER): Payer: BC Managed Care – PPO | Admitting: Lab

## 2012-03-18 ENCOUNTER — Encounter: Payer: Self-pay | Admitting: Oncology

## 2012-03-18 ENCOUNTER — Other Ambulatory Visit: Payer: Self-pay | Admitting: Oncology

## 2012-03-18 ENCOUNTER — Telehealth: Payer: Self-pay | Admitting: Oncology

## 2012-03-18 ENCOUNTER — Ambulatory Visit (HOSPITAL_BASED_OUTPATIENT_CLINIC_OR_DEPARTMENT_OTHER): Payer: BC Managed Care – PPO | Admitting: Oncology

## 2012-03-18 VITALS — BP 205/103 | HR 70 | Temp 97.7°F | Ht 67.0 in | Wt 171.0 lb

## 2012-03-18 DIAGNOSIS — D509 Iron deficiency anemia, unspecified: Secondary | ICD-10-CM

## 2012-03-18 DIAGNOSIS — C9110 Chronic lymphocytic leukemia of B-cell type not having achieved remission: Secondary | ICD-10-CM

## 2012-03-18 DIAGNOSIS — I10 Essential (primary) hypertension: Secondary | ICD-10-CM

## 2012-03-18 LAB — CBC WITH DIFFERENTIAL/PLATELET
BASO%: 0.9 % (ref 0.0–2.0)
EOS%: 1 % (ref 0.0–7.0)
HCT: 31.1 % — ABNORMAL LOW (ref 34.8–46.6)
LYMPH%: 76.9 % — ABNORMAL HIGH (ref 14.0–49.7)
MCH: 25.2 pg (ref 25.1–34.0)
MCHC: 31.3 g/dL — ABNORMAL LOW (ref 31.5–36.0)
NEUT%: 17.9 % — ABNORMAL LOW (ref 38.4–76.8)
Platelets: 252 10*3/uL (ref 145–400)

## 2012-03-18 LAB — IRON AND TIBC
%SAT: 57 % — ABNORMAL HIGH (ref 20–55)
TIBC: 315 ug/dL (ref 250–470)

## 2012-03-18 LAB — TECHNOLOGIST REVIEW

## 2012-03-18 LAB — COMPREHENSIVE METABOLIC PANEL
ALT: 9 U/L (ref 0–35)
AST: 13 U/L (ref 0–37)
Creatinine, Ser: 0.69 mg/dL (ref 0.50–1.10)
Total Bilirubin: 0.5 mg/dL (ref 0.3–1.2)

## 2012-03-18 LAB — LACTATE DEHYDROGENASE: LDH: 167 U/L (ref 94–250)

## 2012-03-18 NOTE — Progress Notes (Signed)
Ferritin today came back 5 despite the fact that this patient received IV Feraheme 510 mg on 01/05/2012 and she is also taking oral iron.  I have scheduled the patient for additional IV Feraheme 510 mg to be given on 03/24/2012 and 03/31/2012. The patient can stop her oral iron.

## 2012-03-18 NOTE — Progress Notes (Signed)
This office note has been dictated.  #[id number]

## 2012-03-18 NOTE — Telephone Encounter (Signed)
Gv pt appt for [id number] °

## 2012-03-19 ENCOUNTER — Telehealth: Payer: Self-pay

## 2012-03-19 NOTE — Progress Notes (Signed)
CC:   Rachael Fee, MD Enzo Bi, PsyD  PROBLEM LIST:  1. Chronic lymphocytic leukemia first diagnosed in February 2010,  stage 0. No prior history of treatment.  2. Iron-deficiency anemia which the patient states she has had for  years. When we first saw the patient 3 years ago in April 2010,  her ferritin was 3, her hemoglobin was 10.3, and she responded to  oral iron.  3. Long history of hypertension.  4. History of breast cysts. The patient had a mammogram at [facility] on 10/09/2011 with a cyst aspiration which was not sent for cytology.  5. Recent history of depression.  6. Systolic ejection murmur.  7. Poor medical compliance.   MEDICATIONS: 1. Ferrous sulfate 325 mg twice daily. 2. Lisinopril/hydrochlorothiazide 20/12.5 mg 1 tablet twice daily.  HISTORY:  I saw Audrey Gross today for follow up of her chronic lymphocytic leukemia and iron-deficiency anemia.  The patient is here today with her husband Mylisa Brunson.  It will be recalled that she was last seen by [place] on 01/05/2012.  At that time her hemoglobin was 8.1, hematocrit 27.3 and her red cell indices were low.  She had pagophagia. The patient seemed to be quite depressed and was having some personal struggles.  At that time she told me that her periods were not abnormally heavy and she had not had a period for several months.  Today she tells me that her periods are normal for her, and she is having periods.  She denies any blood clots.  She uses pads for about 7 days.  On her last visit here, ferritin came back 3.  We gave the patient Feraheme 510 mg IV.  She tolerated this well without any reactions.  We also placed her on oral iron ferrous sulfate 325 mg twice daily.  The patient says she is being compliant with her medicines.  She has seen Dr. Wendall Papa on 04/24.  Celiac workup by him was negative.  The patient will be having a colonoscopy apparently on July 3rd.  Her pagophagia has  stopped.  She saw our psychologist Dr. Merry Proud for a couple of visits.  The patient says that she was somewhat improved.  She apparently does not have any further appointments to see Dr. Noe Gens.  She told me that she needs to find someone to help with family counseling.  At the present time she does not have a primary care physician although I have strongly urged her to find somebody for better treatment of her hypertension. The patient continues to work at [employer].  She has health insurance through her employment.  PHYSICAL EXAM:  The patient looks well.  She seems to be in a better state of mind, was able to laugh and smile today with a little bit more spontaneity.  She generally looks well.  She is without any complaints other than just some decrease in energy, but I believe she feels better than she had.  She is no longer having pagophagia.  Weight is increased 8 pounds up to 171 pounds.  Height 5 feet 7 inches, body surface area 1.91 m2.  Blood pressure today was recorded in the computer as 205/103. Repeat blood pressure was 190/104.  The patient was made aware of her blood pressure and strongly urged to follow up with a primary care physician.  I suggested that she could find 1 over at [facility] since she already has seen Dr. Wendall Papa there.  Other vital signs are normal. There is no scleral icterus.  Mouth and pharynx are benign.  There is no peripheral adenopathy palpable in the neck, supraclavicular, axillary or inguinal areas.  Lungs:  Clear to percussion and auscultation.  Cardiac: Continues to reveal a prominent systolic ejection murmur.  This has been noted previously.  At some point the patient probably needs to have a 2D echocardiogram.  Breasts:  Not examined.  Abdomen:  Notable for possible mild splenomegaly.  There is a little bit of induration in the left upper quadrant.  No definite edge or tip was fell.  No hepatomegaly, abdominal masses or ascites.   Extremities:  No peripheral edema or clubbing.  No petechiae or purpura.  Neurologic:  Normal.  LABORATORY DATA:  Today, white count 13.6, ANC 2.4, hemoglobin 9.7, hematocrit 31.1, platelets 252,000.  MCV 80.3.  On 04/01 hemoglobin was 8.1, hematocrit 27.3, and MCV was 66.9.  Absolute lymphocyte count is 10.5 today as compared with 14.3 on 04/01.  She has 18% neutrophils, 77% lymphocytes.  Chemistries and iron studies today are pending. Chemistries from 01/05/2012 were entirely normal.  Ferritin was 3, iron saturation 5%.  Celiac workup on 04/24 consisting of IgA, gliadin IgG and IgA were all normal.  IMAGING STUDIES: The only imaging study that we have available is  mammogram from 10/09/2011 which reads as follows: Breast tissue is  heterogeneously dense. There is a palpable area in the superior left  breast that was marked with a metallic BB. Multiple bilateral benign-  looking obscured masses were seen. There was scattered benign-looking  calcifications seen bilaterally more on the right. No suspicious  calcifications or architectural distortion. On ultrasound, the  concerning area which is located at the 12 o'clock position in the left  breast. There were 2 adjacent simple cysts seen measuring 3.5 cm in  maximum diameter. Multiple cysts were seen in both breasts. There were  no suspicions looking masses, abnormal shadowing, or other features  suggestive for malignancy and a yearly mammogram was recommended.   The patient underwent a cyst aspiration on 10/09/2011; this was not sent for cytology.    IMPRESSION AND PLAN:  The patient at this point seems to be doing a little better.  As stated, she did receive IV Feraheme 510 mg on 01/05/2012.  She is on oral iron.  Hemoglobin, hematocrit and red cell indices have improved although she is still quite anemic.  Back on 12/13/2009 her hemoglobin was 11.9, hematocrit 36.0 and red cell indices were normal, thus I do not think that she has  a hemoglobinopathy.  We are waiting iron studies today.  If the ferritin is still low, then the patient will receive perhaps 2 or 3 doses of IV Feraheme.  For the moment, she is still on oral iron.  She is scheduled for colonoscopy by Dr. Wendall Papa on July 3rd.  I suggested she may want to stop her iron a few days prior to that appointment.  Her CLL is stable.  There was a suggestion of some splenomegaly.  We may want to get an ultrasound of her abdomen at some point.  She will probably need a 2D echocardiogram to evaluate her systolic ejection murmur.  The patient needs to get a primary care doctor to treat her hypertension.  I have emphasized this to her on several occasions.  The patient also indicated that she is going to find either a psychiatrist or psychologist to help with family counseling.  She would not  elaborate into the dynamics of the family situation, but suggested that there are concerns that have to do with the family that need counseling.  I have asked Lille to return in 2 months at which time we will check CBC, chemistries, and iron studies.    ______________________________ Samul Dada, M.D. DSM/MEDQ  D:  03/18/2012  T:  03/18/2012  Job:  [id number]

## 2012-03-19 NOTE — Telephone Encounter (Signed)
lvm about feraheme appts. I also stated for pt to stop taking oral iron per DSM.

## 2012-03-19 NOTE — Telephone Encounter (Signed)
Left message with female person that she has appts for feraheme. He was unwilling to take dates/times so he will tell pt to call for appts.

## 2012-03-24 ENCOUNTER — Emergency Department (HOSPITAL_COMMUNITY)
Admission: EM | Admit: 2012-03-24 | Discharge: 2012-03-24 | Discharge status: Absent without leave | Payer: BC Managed Care – PPO | Source: Home / Self Care | Attending: Family Medicine | Admitting: Family Medicine

## 2012-03-24 ENCOUNTER — Ambulatory Visit (AMBULATORY_SURGERY_CENTER): Payer: BC Managed Care – PPO | Admitting: *Deleted

## 2012-03-24 ENCOUNTER — Ambulatory Visit (HOSPITAL_BASED_OUTPATIENT_CLINIC_OR_DEPARTMENT_OTHER): Payer: BC Managed Care – PPO

## 2012-03-24 VITALS — BP 213/103 | HR 86 | Temp 99.0°F

## 2012-03-24 VITALS — Ht 67.0 in | Wt 168.9 lb

## 2012-03-24 DIAGNOSIS — Z1211 Encounter for screening for malignant neoplasm of colon: Secondary | ICD-10-CM

## 2012-03-24 DIAGNOSIS — D509 Iron deficiency anemia, unspecified: Secondary | ICD-10-CM

## 2012-03-24 MED ORDER — MOVIPREP 100 G PO SOLR: ORAL | Status: DC

## 2012-03-24 MED ORDER — SODIUM CHLORIDE 0.9 % IV SOLN
Freq: Once | INTRAVENOUS | Status: AC
  Administered 2012-03-24: 14:00:00 via INTRAVENOUS

## 2012-03-24 MED ORDER — FERUMOXYTOL INJECTION 510 MG/17 ML
510.0000 mg | Freq: Once | INTRAVENOUS | Status: AC
  Administered 2012-03-24: 510 mg via INTRAVENOUS
  Filled 2012-03-24: qty 17

## 2012-03-24 NOTE — Progress Notes (Signed)
Physician aware of elevated blood pressures; spoke with patient about obtaining PCP; no signs or symptoms of distress; patient very emotional; tolerated Feraheme IVP well

## 2012-03-24 NOTE — Patient Instructions (Addendum)
Pt discharged to home with husband.  No complaints

## 2012-03-25 ENCOUNTER — Emergency Department (HOSPITAL_COMMUNITY)
Admission: EM | Admit: 2012-03-25 | Discharge: 2012-03-25 | Disposition: A | Discharge status: Home / Self Care | Payer: BC Managed Care – PPO | Attending: Emergency Medicine | Admitting: Emergency Medicine

## 2012-03-25 ENCOUNTER — Encounter (HOSPITAL_COMMUNITY): Payer: Self-pay | Admitting: Emergency Medicine

## 2012-03-25 ENCOUNTER — Emergency Department (INDEPENDENT_AMBULATORY_CARE_PROVIDER_SITE_OTHER)
Admission: EM | Admit: 2012-03-25 | Discharge: 2012-03-25 | Disposition: A | Discharge status: Nursing Home (Includes ICF) | Payer: BC Managed Care – PPO | Source: Home / Self Care | Attending: Emergency Medicine | Admitting: Emergency Medicine

## 2012-03-25 ENCOUNTER — Encounter (HOSPITAL_COMMUNITY): Payer: Self-pay

## 2012-03-25 ENCOUNTER — Encounter: Payer: Self-pay | Admitting: Gastroenterology

## 2012-03-25 DIAGNOSIS — I10 Essential (primary) hypertension: Secondary | ICD-10-CM

## 2012-03-25 DIAGNOSIS — R9431 Abnormal electrocardiogram [ECG] [EKG]: Secondary | ICD-10-CM

## 2012-03-25 DIAGNOSIS — Z79899 Other long term (current) drug therapy: Secondary | ICD-10-CM | POA: Insufficient documentation

## 2012-03-25 DIAGNOSIS — Z856 Personal history of leukemia: Secondary | ICD-10-CM | POA: Insufficient documentation

## 2012-03-25 LAB — POCT I-STAT, CHEM 8
Creatinine, Ser: 0.7 mg/dL (ref 0.50–1.10)
HCT: 36 % (ref 36.0–46.0)
Hemoglobin: 12.2 g/dL (ref 12.0–15.0)
Potassium: 3.8 mEq/L (ref 3.5–5.1)
Sodium: 141 mEq/L (ref 135–145)

## 2012-03-25 LAB — POCT URINALYSIS DIP (DEVICE)
Glucose, UA: NEGATIVE mg/dL
Specific Gravity, Urine: 1.01 (ref 1.005–1.030)
Urobilinogen, UA: 0.2 mg/dL (ref 0.0–1.0)

## 2012-03-25 LAB — POCT I-STAT TROPONIN I

## 2012-03-25 MED ORDER — LABETALOL HCL 100 MG PO TABS: 100.0000 mg | ORAL_TABLET | ORAL | Status: DC

## 2012-03-25 MED ORDER — SODIUM CHLORIDE 0.9 % IV SOLN
Freq: Once | INTRAVENOUS | Status: AC
  Administered 2012-03-25: 16:00:00 via INTRAVENOUS

## 2012-03-25 MED ORDER — LABETALOL HCL 5 MG/ML IV SOLN
20.0000 mg | Freq: Once | INTRAVENOUS | Status: AC
  Administered 2012-03-25: 20 mg via INTRAVENOUS
  Filled 2012-03-25: qty 4

## 2012-03-25 NOTE — ED Notes (Signed)
Pt alert and oriented, with steady gait at time of discharge. Pt given discharge papers and papers explained. All questions answered and pt walked to discharge.  

## 2012-03-25 NOTE — ED Provider Notes (Signed)
History     CSN: [id number]  Arrival date & time 03/25/12  1304   First MD Initiated Contact with Patient 03/25/12 1403      Chief Complaint  Patient presents with  . Blood Pressure Check  . Medication Refill    (Consider location/radiation/quality/duration/timing/severity/associated sxs/prior treatment) HPI Comments:  Patient presents for hypertension. She has a history of poorly controlled/uncontrolled hypertension, and states she ran out of her lisinopril/hydrochlorothiazide about 4 days ago. States that she was having a headache yesterday, and felt like she was about to "pass out" yesterday, but did not syncopize. Currently, she denies any headache, chest pain, shortness of breath, blurry vision, dysarthria, discoordination, abdominal pain, and urea or hematuria, lower extremity swelling. She states that she has an appointment with a primary care physician later this month to establish care.   Patient has multiple records of persistently elevated blood pressure, since January. BP on on 01/28/2012. Measured at 182/100 and on  03/19/2012 205/103, 190/104.   ROS as noted in HPI. All other ROS negative.   Patient is a 51 y.o. female presenting with hypertension. The history is provided by the patient.  Hypertension This is a chronic problem. The current episode started more than 1 week ago. The problem occurs constantly. Pertinent negatives include no chest pain, no abdominal pain, no headaches and no shortness of breath. Nothing relieves the symptoms. She has tried nothing for the symptoms. The treatment provided no relief.    Past Medical History  Diagnosis Date  . Hypertension   . CLL (chronic lymphoblastic leukemia)   . Depression   . Iron deficiency anemia, unspecified   . Diffuse cystic mastopathy   . Cystic breast   . Systolic ejection murmur   . Medical non-compliance     Past Surgical History  Procedure Date  . Tubal ligation     Family History  Problem Relation  Age of Onset  . Kidney disease Father   . Stomach cancer Maternal Aunt     History  Substance Use Topics  . Smoking status: Never Smoker   . Smokeless tobacco: Never Used  . Alcohol Use: No    OB History    Grav Para Term Preterm Abortions TAB SAB Ect Mult Living                  Review of Systems  Respiratory: Negative for shortness of breath.   Cardiovascular: Negative for chest pain.  Gastrointestinal: Negative for abdominal pain.  Neurological: Negative for headaches.    Allergies  Review of patient's allergies indicates no known allergies.  Home Medications   Current Outpatient Rx  Name Route Sig Dispense Refill  . LISINOPRIL-HYDROCHLOROTHIAZIDE 20-12.5 MG PO TABS Oral Take 1 tablet by mouth 2 (two) times daily.     Marland Kitchen FERROUS SULFATE 325 (65 FE) MG PO TABS Oral Take 325 mg by mouth 2 (two) times daily.     Marland Kitchen MOVIPREP 100 G PO SOLR  movi prep as directed no substitute 1 each 0    Dispense as written.  Marland Kitchen FERUMOXYTOL IV INFUSION Intravenous Inject 1,020 mg into the vein once. x3 treatments-just had 2nd. one      BP 198/94  Pulse 74  Temp 99.3 F (37.4 C) (Oral)  Resp 18  SpO2 100%  LMP 03/02/2012  Physical Exam  Nursing note and vitals reviewed. Constitutional: She is oriented to person, place, and time. She appears well-developed and well-nourished. No distress.  HENT:  Head: Normocephalic and atraumatic.  Eyes: Conjunctivae and EOM are normal.  Fundoscopic exam:      The right eye shows no hemorrhage and no papilledema.       The left eye shows no hemorrhage and no papilledema.  Neck: Normal range of motion.  Cardiovascular: Normal rate, regular rhythm, normal heart sounds and intact distal pulses.   Pulmonary/Chest: Effort normal and breath sounds normal.  Abdominal: She exhibits no distension.  Musculoskeletal: Normal range of motion. She exhibits no edema.  Neurological: She is alert and oriented to person, place, and time. She has normal strength.  No cranial nerve deficit or sensory deficit. She displays a negative Romberg sign. Coordination and gait normal.       Tandem gait steady  Skin: Skin is warm and dry.  Psychiatric: She has a normal mood and affect. Her behavior is normal. Judgment and thought content normal.    ED Course  Procedures (including critical care time)   Labs Reviewed  POCT URINALYSIS DIP (DEVICE)   No results found.   1. Hypertension   2. EKG abnormalities     CBC, CMP on 6/13 6 was normal.    MDM  Previous records, labs reviewed. As noted above.  EKG: Normal sinus rhythm, rate 68. Normal intervals. Left axis deviation. LVH. T-wave inversion in III, aVF. No previous EKG for comparison.    Patient does report that she had a stress test 3 years ago, but unable to find record of this despite extensive search . She does not remember who her cardiologist is.  Concern for the hypertension, abnormal EKG. Unable to confirm that these are not new. Transferring to the ED for further evaluation.  Luiz Blare, MD 03/25/12 [phone]

## 2012-03-25 NOTE — ED Notes (Signed)
MRN# was incorrect on the EKG it was [id number] but corrected to the right MRN# of [id number]

## 2012-03-25 NOTE — ED Notes (Addendum)
Pt here with blurred vision for 4 dys after running out of BP MED.TAKING LISINOPRIL 20MG  TAB.IN THE PROCESS OF GETTING NEW PCP.DENIES N/V OR CP.BP 198/98

## 2012-03-25 NOTE — Discharge Instructions (Signed)
Hypertension Information As your heart beats, it forces blood through your arteries. This force is your blood pressure. If the pressure is too high, it is called hypertension (HTN) or high blood pressure. HTN is dangerous because you may have it and not know it. High blood pressure may mean that your heart has to work harder to pump blood. Your arteries may be narrow or stiff. The extra work puts you at risk for heart disease, stroke, and other problems.  Blood pressure consists of two numbers, a higher number over a lower, 110/72, for example. It is stated as "110 over 72." The ideal is below 120 for the top number (systolic) and under 80 for the bottom (diastolic).  You should pay close attention to your blood pressure if you have certain conditions such as:  Heart failure.   Prior heart attack.   Diabetes   Chronic kidney disease.   Prior stroke.   Multiple risk factors for heart disease.  To see if you have HTN, your blood pressure should be measured while you are seated with your arm held at the level of the heart. It should be measured at least twice. A one-time elevated blood pressure reading (especially in the Emergency Department) does not mean that you need treatment. There may be conditions in which the blood pressure is different between your right and left arms. It is important to see your caregiver soon for a recheck. Most people have essential hypertension which means that there is not a specific cause. This type of high blood pressure may be lowered by changing lifestyle factors such as:  Stress.   Smoking.   Lack of exercise.   Excessive weight.   Drug/tobacco/alcohol use.   Eating less salt.  Most people do not have symptoms from high blood pressure until it has caused damage to the body. Effective treatment can often prevent, delay or reduce that damage. TREATMENT  Treatment for high blood pressure, when a cause has been identified, is directed at the cause. There  are a large number of medications to treat HTN. These fall into several categories, and your caregiver will help you select the medicines that are best for you. Medications may have side effects. You should review side effects with your caregiver. If your blood pressure stays high after you have made lifestyle changes or started on medicines,   Your medication(s) may need to be changed.   Other problems may need to be addressed.   Be certain you understand your prescriptions, and know how and when to take your medicine.   Be sure to follow up with your caregiver within the time frame advised (usually within two weeks) to have your blood pressure rechecked and to review your medications.   If you are taking more than one medicine to lower your blood pressure, make sure you know how and at what times they should be taken. Taking two medicines at the same time can result in blood pressure that is too low.  Document Released: 11/25/2005 Document Revised: 06/04/2011 Document Reviewed: 12/02/2007 Exit[employer] Patient Information 2012 [employer]. 

## 2012-03-25 NOTE — ED Notes (Signed)
Dr Ignacia Palma aware of patients BP 200/100 which dropped to 123/92 when she stood up to go to the BR.  Also aware of her c/o SOB  Stated he was still going to discharge her

## 2012-03-25 NOTE — ED Notes (Signed)
Heart monitor placed on Pt

## 2012-03-25 NOTE — ED Provider Notes (Signed)
History     CSN: [id number]  Arrival date & time 03/25/12  1643   First MD Initiated Contact with Patient 03/25/12 1659      Chief Complaint  Patient presents with  . Blurred Vision  . Hypertension    (Consider location/radiation/quality/duration/timing/severity/associated sxs/prior treatment) HPI Comments: Pt is a 51 year old woman with hypertension of long standing, with poor control of BP.  She has had blurred vision and irregular heartbeat recently.  She is on lisinopril/HCTZ 20/12, has been on that medication for the last 3 years, but has been off it for the last 4 days. [facility] and was sent from there to the ED because she had an abnormal EKG.  Patient is a 51 y.o. female presenting with hypertension.  Hypertension This is a chronic problem. The current episode started more than 1 week ago. The problem occurs constantly. The problem has not changed since onset.Associated symptoms comments: Blurred vision, irregular heartbeat.. Nothing aggravates the symptoms. Nothing relieves the symptoms. Treatments tried: She has been on lisinopril/HCTZ without relief.    Past Medical History  Diagnosis Date  . Hypertension   . CLL (chronic lymphoblastic leukemia)   . Depression   . Iron deficiency anemia, unspecified   . Diffuse cystic mastopathy   . Cystic breast   . Systolic ejection murmur   . Medical non-compliance     Past Surgical History  Procedure Date  . Tubal ligation     Family History  Problem Relation Age of Onset  . Kidney disease Father   . Stomach cancer Maternal Aunt     History  Substance Use Topics  . Smoking status: Never Smoker   . Smokeless tobacco: Never Used  . Alcohol Use: No    OB History    Grav Para Term Preterm Abortions TAB SAB Ect Mult Living                  Review of Systems  Constitutional: Negative.   HENT: Negative.   Eyes: Positive for visual disturbance.  Respiratory: Negative.   Cardiovascular:  Positive for palpitations.  Gastrointestinal: Negative.   Genitourinary: Negative.   Musculoskeletal: Negative.   Skin: Negative.   Neurological: Negative.   Psychiatric/Behavioral: Negative.     Allergies  Review of patient's allergies indicates no known allergies.  Home Medications   Current Outpatient Rx  Name Route Sig Dispense Refill  . LISINOPRIL-HYDROCHLOROTHIAZIDE 20-12.5 MG PO TABS Oral Take 1 tablet by mouth 2 (two) times daily.     Marland Kitchen FERUMOXYTOL IV INFUSION Intravenous Inject 1,020 mg into the vein once. x3 treatments-just had 2nd. one    . MOVIPREP 100 G PO SOLR  movi prep as directed no substitute 1 each 0    Dispense as written.    BP 206/101  Pulse 69  Temp 98.9 F (37.2 C) (Oral)  Resp 23  SpO2 100%  LMP 03/02/2012  Physical Exam  Nursing note and vitals reviewed. Constitutional: She is oriented to person, place, and time. She appears well-developed and well-nourished. No distress.  HENT:  Head: Normocephalic and atraumatic.  Right Ear: External ear normal.  Left Ear: External ear normal.  Mouth/Throat: Oropharynx is clear and moist.  Eyes: Conjunctivae and EOM are normal. Pupils are equal, round, and reactive to light.  Neck: Normal range of motion. Neck supple.  Cardiovascular: Normal rate, regular rhythm and normal heart sounds.   Pulmonary/Chest: Effort normal and breath sounds normal.  Abdominal: Soft. Bowel  sounds are normal.  Musculoskeletal: Normal range of motion. She exhibits no edema and no tenderness.  Neurological: She is alert and oriented to person, place, and time.       No sensory or motor deficit.  Skin: Skin is warm and dry.  Psychiatric: She has a normal mood and affect. Her behavior is normal.    ED Course  Procedures (including critical care time)   5:48 PM Results for orders placed during the hospital encounter of 03/25/12  POCT URINALYSIS DIP (DEVICE)      Component Value Range   Glucose, UA NEGATIVE  NEGATIVE mg/dL    Bilirubin Urine NEGATIVE  NEGATIVE   Ketones, ur NEGATIVE  NEGATIVE mg/dL   Specific Gravity, Urine 1.010  1.005 - 1.030   Hgb urine dipstick NEGATIVE  NEGATIVE   pH 7.0  5.0 - 8.0   Protein, ur NEGATIVE  NEGATIVE mg/dL   Urobilinogen, UA 0.2  0.0 - 1.0 mg/dL   Nitrite NEGATIVE  NEGATIVE   Leukocytes, UA NEGATIVE  NEGATIVE    Date: 03/25/2012  Rate: 68  Rhythm: normal sinus rhythm  QRS Axis: left  Intervals: normal QRS:  Left ventricular hypertrophy, poor R wave progression in precordial leads suggests possible old anterior myocardial infarction.  ST/T Wave abnormalities: Inverted t waves in inferior and lateral leads.   Conduction Disutrbances:none  Narrative Interpretation: Abnormal EKG  Old EKG Reviewed: none available  5:51 PM Pt seen --> physical exam performed.  Recent lab tests reviewed. Will order an ISTAT 8 and TNI.  Will give Labetalol 20 mg IV to treat her hypertension.   8:45 PM Lab workup is negative.  Advised pt to continue to take lisinopril/HCTZ, and to add labetalol 100 mg once a day.  She is going to establish herself with Dr. Sanda Linger as her PCP in July.  1. Hypertension       Carleene Cooper III, MD 03/26/12 1246

## 2012-03-25 NOTE — ED Notes (Signed)
States she went to Urgent Care because she was worried about her blood pressure. Had a rough day yesterday but doesn't know when the problem started because she has had it so long she is used to it. Urgent Care sent her via [facility]. EMTs state she went to Urgent Care because she had blurred vision.

## 2012-03-25 NOTE — ED Notes (Signed)
Patient up to the [facility]  No complaints voiced

## 2012-03-26 ENCOUNTER — Other Ambulatory Visit: Payer: BC Managed Care – PPO | Admitting: Gastroenterology

## 2012-03-26 ENCOUNTER — Encounter: Payer: BC Managed Care – PPO | Admitting: Gastroenterology

## 2012-03-31 ENCOUNTER — Ambulatory Visit (HOSPITAL_BASED_OUTPATIENT_CLINIC_OR_DEPARTMENT_OTHER): Payer: BC Managed Care – PPO

## 2012-03-31 VITALS — BP 163/89 | HR 62 | Temp 97.0°F

## 2012-03-31 DIAGNOSIS — D509 Iron deficiency anemia, unspecified: Secondary | ICD-10-CM

## 2012-03-31 DIAGNOSIS — I10 Essential (primary) hypertension: Secondary | ICD-10-CM

## 2012-03-31 MED ORDER — SODIUM CHLORIDE 0.9 % IV SOLN: Freq: Once | INTRAVENOUS | Status: DC

## 2012-03-31 MED ORDER — FERUMOXYTOL INJECTION 510 MG/17 ML
510.0000 mg | Freq: Once | INTRAVENOUS | Status: AC
  Administered 2012-03-31: 510 mg via INTRAVENOUS
  Filled 2012-03-31: qty 17

## 2012-03-31 MED ORDER — CLONIDINE HCL 0.1 MG PO TABS
0.2000 mg | ORAL_TABLET | ORAL | Status: AC
  Administered 2012-03-31: 0.2 mg via ORAL

## 2012-03-31 NOTE — Progress Notes (Signed)
Patient's blood pressure elevated upon arrival to [facility].  Per Ramond Craver, 0.2 clonidine given.  Blood pressure checked before discharge, significantly decreased.  Ramond Craver aware.  Per patient, followup with new PCP on July 26th.

## 2012-03-31 NOTE — Progress Notes (Signed)
Pt BP 221/106, per Bobbe Medico, PA, given pt 0.2 clonidine PO and will reassess in 30 minutes.  Pt aware of the plan, will continue to monitor.  SLJ

## 2012-04-07 ENCOUNTER — Encounter: Payer: Self-pay | Admitting: Gastroenterology

## 2012-04-07 ENCOUNTER — Ambulatory Visit (AMBULATORY_SURGERY_CENTER): Payer: BC Managed Care – PPO | Admitting: Gastroenterology

## 2012-04-07 VITALS — BP 188/97 | HR 61 | Temp 98.6°F | Resp 18 | Ht 67.0 in | Wt 168.0 lb

## 2012-04-07 DIAGNOSIS — D509 Iron deficiency anemia, unspecified: Secondary | ICD-10-CM

## 2012-04-07 DIAGNOSIS — Z1211 Encounter for screening for malignant neoplasm of colon: Secondary | ICD-10-CM

## 2012-04-07 MED ORDER — SODIUM CHLORIDE 0.9 % IV SOLN: 500.0000 mL | INTRAVENOUS | Status: DC

## 2012-04-07 NOTE — Progress Notes (Signed)
Patient did not experience any of the following events: a burn prior to discharge; a fall within the facility; wrong site/side/patient/procedure/implant event; or a hospital transfer or hospital admission upon discharge from the facility. (G8907) Patient did not have preoperative order for IV antibiotic SSI prophylaxis. (G8918)  

## 2012-04-07 NOTE — Op Note (Signed)
[facility] [address] [place], [place]  [address]  COLONOSCOPY PROCEDURE REPORT  PATIENT:  Audrey Gross, Audrey Gross  MR#:  [id number] BIRTHDATE:  1960/10/20, 51 yrs. old  GENDER:  female ENDOSCOPIST:  Rachael Fee, MD PROCEDURE DATE:  04/07/2012 PROCEDURE:  Colonoscopy 78295 ASA CLASS:  Class II INDICATIONS:  IDA MEDICATIONS:   Fentanyl 50 mcg IV, These medications were titrated to patient response per physician's verbal order, Versed 5 mg IV  DESCRIPTION OF PROCEDURE:   After the risks benefits and alternatives of the procedure were thoroughly explained, informed consent was obtained.  Digital rectal exam was performed and revealed no rectal masses.   The LB [id number] endoscope was introduced through the anus and advanced to the cecum, which was identified by both the appendix and ileocecal valve, without limitations.  The quality of the prep was good..  The instrument was then slowly withdrawn as the colon was fully examined. <<PROCEDUREIMAGES>> FINDINGS:  A normal appearing cecum, ileocecal valve, and appendiceal orifice were identified. The ascending, hepatic flexure, transverse, splenic flexure, descending, sigmoid colon, and rectum appeared unremarkable (see image1, image2, and image3). Retroflexed views in the rectum revealed no abnormalities. COMPLICATIONS:  None  ENDOSCOPIC IMPRESSION: 1) Normal colon 2) No polyps or cancers  RECOMMENDATIONS: 1) You should continue to follow colorectal cancer screening guidelines for "routine risk" patients with a repeat colonoscopy in 10 years. There is no need for FOBT (stool) testing for at least 5 years. 2) Continue iron therapy as directed by Dr. Arline Asp. 3) My office will set up referral to gynecology for what sounds like very heavy menstral bleeding (that is likely the cause of your anemia).  REPEAT EXAM:  10 years  ______________________________ Rachael Fee, MD  cc: Dr. Arline Asp  n. eSIGNED:    Rachael Fee at 04/07/2012 10:31 AM  Sharee Pimple, [phone]

## 2012-04-07 NOTE — Patient Instructions (Addendum)
Discharge instructions given with verbal understanding. Normal exam. Resume previous medications. YOU HAD AN ENDOSCOPIC PROCEDURE TODAY AT THE [facility]: Refer to the procedure report that was given to you for any specific questions about what was found during the examination.  If the procedure report does not answer your questions, please call your gastroenterologist to clarify.  If you requested that your care partner not be given the details of your procedure findings, then the procedure report has been included in a sealed envelope for you to review at your convenience later.  YOU SHOULD EXPECT: Some feelings of bloating in the abdomen. Passage of more gas than usual.  Walking can help get rid of the air that was put into your GI tract during the procedure and reduce the bloating. If you had a lower endoscopy (such as a colonoscopy or flexible sigmoidoscopy) you may notice spotting of blood in your stool or on the toilet paper. If you underwent a bowel prep for your procedure, then you may not have a normal bowel movement for a few days.  DIET: Your first meal following the procedure should be a light meal and then it is ok to progress to your normal diet.  A half-sandwich or bowl of soup is an example of a good first meal.  Heavy or fried foods are harder to digest and may make you feel nauseous or bloated.  Likewise meals heavy in dairy and vegetables can cause extra gas to form and this can also increase the bloating.  Drink plenty of fluids but you should avoid alcoholic beverages for 24 hours.  ACTIVITY: Your care partner should take you home directly after the procedure.  You should plan to take it easy, moving slowly for the rest of the day.  You can resume normal activity the day after the procedure however you should NOT DRIVE or use heavy machinery for 24 hours (because of the sedation medicines used during the test).    SYMPTOMS TO REPORT IMMEDIATELY: A gastroenterologist  can be reached at any hour.  During normal business hours, 8:30 AM to 5:00 PM Monday through Friday, call [phone].  After hours and on weekends, please call the GI answering service at [phone] who will take a message and have the physician on call contact you.   Following lower endoscopy (colonoscopy or flexible sigmoidoscopy):  Excessive amounts of blood in the stool  Significant tenderness or worsening of abdominal pains  Swelling of the abdomen that is new, acute  Fever of 100F or higher  FOLLOW UP: If any biopsies were taken you will be contacted by phone or by letter within the next 1-3 weeks.  Call your gastroenterologist if you have not heard about the biopsies in 3 weeks.  Our staff will call the home number listed on your records the next business day following your procedure to check on you and address any questions or concerns that you may have at that time regarding the information given to you following your procedure. This is a courtesy call and so if there is no answer at the home number and we have not heard from you through the emergency physician on call, we will assume that you have returned to your regular daily activities without incident.  SIGNATURES/CONFIDENTIALITY: You and/or your care partner have signed paperwork which will be entered into your electronic medical record.  These signatures attest to the fact that that the information above on your After Visit Summary has been reviewed   and is understood.  Full responsibility of the confidentiality of this discharge information lies with you and/or your care-partner. 

## 2012-04-09 ENCOUNTER — Telehealth: Payer: Self-pay | Admitting: *Deleted

## 2012-04-09 NOTE — Telephone Encounter (Signed)
  Follow up Call-  Call back number 04/07/2012  Post procedure Call Back phone  # [phone]  Permission to leave phone message Yes     Unable to leave mess. On callback F/U . Attempted x3 , call did not go through either lots of static then call lost or a busy signal came on line  On ph no. Left to call

## 2012-04-30 ENCOUNTER — Ambulatory Visit: Payer: BC Managed Care – PPO | Admitting: Internal Medicine

## 2012-05-18 ENCOUNTER — Other Ambulatory Visit (HOSPITAL_BASED_OUTPATIENT_CLINIC_OR_DEPARTMENT_OTHER): Payer: BC Managed Care – PPO | Admitting: Lab

## 2012-05-18 ENCOUNTER — Encounter: Payer: Self-pay | Admitting: Oncology

## 2012-05-18 ENCOUNTER — Telehealth: Payer: Self-pay | Admitting: Oncology

## 2012-05-18 ENCOUNTER — Ambulatory Visit (HOSPITAL_BASED_OUTPATIENT_CLINIC_OR_DEPARTMENT_OTHER): Payer: BC Managed Care – PPO | Admitting: Family

## 2012-05-18 ENCOUNTER — Encounter: Payer: Self-pay | Admitting: Family

## 2012-05-18 ENCOUNTER — Other Ambulatory Visit: Payer: Self-pay | Admitting: Oncology

## 2012-05-18 VITALS — BP 157/92 | HR 70 | Temp 98.4°F | Resp 20 | Ht 67.0 in | Wt 168.0 lb

## 2012-05-18 DIAGNOSIS — I10 Essential (primary) hypertension: Secondary | ICD-10-CM

## 2012-05-18 DIAGNOSIS — C9110 Chronic lymphocytic leukemia of B-cell type not having achieved remission: Secondary | ICD-10-CM

## 2012-05-18 DIAGNOSIS — R011 Cardiac murmur, unspecified: Secondary | ICD-10-CM

## 2012-05-18 DIAGNOSIS — F329 Major depressive disorder, single episode, unspecified: Secondary | ICD-10-CM

## 2012-05-18 DIAGNOSIS — D509 Iron deficiency anemia, unspecified: Secondary | ICD-10-CM

## 2012-05-18 LAB — CBC WITH DIFFERENTIAL/PLATELET
BASO%: 0.6 % (ref 0.0–2.0)
HCT: 37.3 % (ref 34.8–46.6)
MCHC: 32 g/dL (ref 31.5–36.0)
MONO#: 0.1 10*3/uL (ref 0.1–0.9)
RBC: 4.26 10*6/uL (ref 3.70–5.45)
WBC: 14.7 10*3/uL — ABNORMAL HIGH (ref 3.9–10.3)
lymph#: 11.9 10*3/uL — ABNORMAL HIGH (ref 0.9–3.3)

## 2012-05-18 LAB — COMPREHENSIVE METABOLIC PANEL
ALT: 9 U/L (ref 0–35)
CO2: 28 mEq/L (ref 19–32)
Calcium: 9.3 mg/dL (ref 8.4–10.5)
Chloride: 104 mEq/L (ref 96–112)
Potassium: 4 mEq/L (ref 3.5–5.3)
Sodium: 139 mEq/L (ref 135–145)
Total Protein: 6.9 g/dL (ref 6.0–8.3)

## 2012-05-18 LAB — LACTATE DEHYDROGENASE: LDH: 165 U/L (ref 94–250)

## 2012-05-18 LAB — IRON AND TIBC: %SAT: 29 % (ref 20–55)

## 2012-05-18 NOTE — Progress Notes (Unsigned)
This patient's ferritin came back 67 today, 05/18/2012. She had received IV Feraheme 510 mg per dose on 6/19 and 03/31/2012 after ferritin on 03/18/2012 came back 5. The patient states that she is still having heavy periods. She denied pagophagia at this time.  We will plan to give the patient's additional IV Feraheme 510 mg per dose on or about the following dates: 05/25/2012, 06/08/2012 and 06/22/2012.  Patient has been set up for a cardiology and gynecology referral. She needs to have something done about her excessive menstrual losses.  Patient has an appointment to see me on October 15.

## 2012-05-18 NOTE — Patient Instructions (Addendum)
Patient ID: Audrey Gross,   DOB: March 22, 1961,  MRN: [id number]   [facility] Discharge Instructions  RECOMMENDATIONS MAD BY THE CONSULTANT AND ANY TEST RESULT(S) WILL BE FORWARDED TO YOU REFERRING DOCTOR   EXAM FINDINGS BY NURSE PRACTITIONER TODAY TO REPORT TO THE CLINIC OR PRIMARY PROVIDER:    Your Current Medications Are: Current Outpatient Prescriptions  Medication Sig Dispense Refill  . ferrous sulfate 325 (65 FE) MG tablet Take 325 mg by mouth daily with breakfast.      . labetalol (NORMODYNE) 100 MG tablet Take 1 tablet (100 mg total) by mouth 1 day or 1 dose.  30 tablet  1  . lisinopril-hydrochlorothiazide (PRINZIDE,ZESTORETIC) 20-12.5 MG per tablet Take 1 tablet by mouth 2 (two) times daily.       . sodium chloride 0.9 % SOLN 100 mL with ferumoxytol 510 MG/17ML SOLN 1,020 mg Inject 1,020 mg into the vein once. x3 treatments-just had 2nd. one         INSTRUCTIONS GIVEN, DISCUSSED AND FOLLOW-UP: Please follow up with GYN doctor regarding your heavy menstrual cycles Please follow up with Cardiologist regarding your heart murmur and high blood pressure  I acknowledge that I have been informed and understand all the instructions given to me and have received a copy.  I do not have any further questions at this time, but I understand that I may call the [facility] at [phone] during business hours should I have any further questions or need assistance in obtaining follow-up care.   05/18/2012, 10:51 AM

## 2012-05-18 NOTE — Progress Notes (Signed)
Patient ID: Audrey Gross, female   DOB: Jan 22, 1961, 51 y.o.   MRN: [id number] CSN: [id number] CC: Rachael Fee, MD  Enzo Bi, PsyD Rosalin Hawking, MD  Problem List: Audrey Gross is a 51 y.o. African-American female with a problem list consisting of:  1. Chronic lymphocytic leukemia first diagnosed in February 2010, stage 0. No prior history of treatment.  2. Iron-deficiency anemia which the patient states she has had for years. When we first saw the patient 3 years ago in April 2010, her ferritin was 3, her hemoglobin was 10.3, and she responded to oral iron.  3. Long history of hypertension.  4. History of breast cysts. The patient had a mammogram at [facility] on 10/09/2011 with a cyst aspiration which was not sent for cytology.  5. Recent history of depression.  6. Systolic ejection murmur.  7. Poor medical compliance.  Audrey Gross was seen in the clinic today for follow up of her chronic lymphocytic leukemia and iron-deficiency anemia. The patient is here today with her husband Audrey Gross. She was last seen by [place] on 03/21/2012.  At that time her hemoglobin was 9.7, hematocrit 31.1 and her red cell indices were low. The patient still appears to be quite depressed and continues to have personal  struggles/stressors.  The patient continues to have unusually heavy menstrual periods but does not see a GYN. On her last visit here, ferritin was 5.   The patient has received Feraheme 510 mg IV previously and tolerated the infusion well, without any reactions. The patient is also taking oral iron ferrous sulfate 325 mg daily. The patient says she is being compliant with her medicines. The patient had a colonoscopy on April 07, 2012 which was a normal colon that did not show any polyps or cancer.  The patient denies any symptomatology or concerns.    Audrey Gross has seen psychologist Dr. Merry Proud.The patient says that she was somewhat improved but would like to receive family counseling.  At the present time she does not have a primary care physician, but is scheduled to see Dr. Rosalin Hawking soon.  The patient continues to work at [employer]. She has health insurance through her employment.  Past Medical History: Past Medical History  Diagnosis Date  . Hypertension   . CLL (chronic lymphoblastic leukemia)   . Depression   . Iron deficiency anemia, unspecified   . Diffuse cystic mastopathy   . Cystic breast   . Systolic ejection murmur   . Medical non-compliance     Surgical History: Past Surgical History  Procedure Date  . Tubal ligation     Current Medications: Current Outpatient Prescriptions  Medication Sig Dispense Refill  . ferrous sulfate 325 (65 FE) MG tablet Take 325 mg by mouth daily with breakfast.      . labetalol (NORMODYNE) 100 MG tablet Take 1 tablet (100 mg total) by mouth 1 day or 1 dose.  30 tablet  1  . lisinopril-hydrochlorothiazide (PRINZIDE,ZESTORETIC) 20-12.5 MG per tablet Take 1 tablet by mouth 2 (two) times daily.       . sodium chloride 0.9 % SOLN 100 mL with ferumoxytol 510 MG/17ML SOLN 1,020 mg Inject 1,020 mg into the vein once. x3 treatments-just had 2nd. one        Allergies: No Known Allergies   Family History: Family History  Problem Relation Age of Onset  . Kidney disease Father   . Stomach cancer Maternal Aunt  Social History: History  Substance Use Topics  . Smoking status: Never Smoker   . Smokeless tobacco: Never Used  . Alcohol Use: No    Review of Systems: 10 Point review of systems was completed and is negative except as noted above.   Physical Exam:   Blood pressure 157/92, pulse 70, temperature 98.4 F (36.9 C), temperature source Oral, resp. rate 20, height 5\' 7"  (1.702 m), weight 168 lb (76.204 kg).  General appearance: Alert, cooperative, appears stated age,mild distress Head: Normocephalic, without obvious abnormality, atraumatic Eyes: Conjunctivae/corneas clear, PERRLA, EOMI, anti-icteric  sclerae Nose: Nares, septum and mucosa are normal, no drainage or sinus tenderness Throat: Lips, mucosa, and tongue normal, gums are normal, teeth have dental caries present Neck: No adenopathy, supple, symmetrical, trachea midline, thyroid not enlarged, symmetric, no tenderness Back: Symmetric, ROM normal, no CVA tenderness Resp: Clear to auscultation bilaterally Cardio: Systolic murmur 2/6, S1/S2 are normal GI: Soft, non-tender, bowel sounds normal, no organomegaly, well healed surgical scar Extremities: Extremities normal, atraumatic, no cyanosis or edema Neurologic: Grossly normal   Laboratory Data: Results for orders placed in visit on 05/18/12 (from the past 48 hour(s))  CBC WITH DIFFERENTIAL     Status: Abnormal   Collection Time   05/18/12  9:33 AM      Component Value Range Comment   WBC 14.7 (*) 3.9 - 10.3 10e3/uL    NEUT# 2.6  1.5 - 6.5 10e3/uL    HGB 11.9  11.6 - 15.9 g/dL    HCT 16.1  09.6 - 04.5 %    Platelets 265  145 - 400 10e3/uL    MCV 87.5  79.5 - 101.0 fL    MCH 28.0  25.1 - 34.0 pg    MCHC 32.0  31.5 - 36.0 g/dL    RBC 4.09  8.11 - 9.14 10e6/uL    RDW 16.9 (*) 11.2 - 14.5 %    lymph# 11.9 (*) 0.9 - 3.3 10e3/uL    MONO# 0.1  0.1 - 0.9 10e3/uL    Eosinophils Absolute 0.1  0.0 - 0.5 10e3/uL    Basophils Absolute 0.1  0.0 - 0.1 10e3/uL    NEUT% 17.7 (*) 38.4 - 76.8 %    LYMPH% 80.9 (*) 14.0 - 49.7 %    MONO% 0.4  0.0 - 14.0 %    EOS% 0.4  0.0 - 7.0 %    BASO% 0.6  0.0 - 2.0 %   TECHNOLOGIST REVIEW     Status: Normal   Collection Time   05/18/12  9:33 AM      Component Value Range Comment   Technologist Review Variant lymphs and few smudge cells present       Imaging Studies: The only imaging study that we have available is mammogram from 10/09/2011 which reads as follows: Breast tissue is heterogeneously dense. There is a palpable area in the superior left breast that was marked with a metallic BB. Multiple bilateral benign-looking obscured masses were  seen. There was scattered benign-looking calcifications seen bilaterally more on the right. No suspicious calcifications or architectural distortion. On ultrasound, the concerning area which is located at the 12 o'clock position in the left breast. There were 2 adjacent simple cysts seen measuring 3.5 cm in maximum diameter. Multiple cysts were seen in both breasts. There were no suspicions looking masses, abnormal shadowing, or other features suggestive for malignancy and a yearly mammogram was recommended.  The patient underwent a cyst aspiration on 10/09/2011; this was not sent for  cytology.  04/17/2012 Colonoscopy:  Endoscopic Impression - Normal colon, no polyps or cancers   Impression/Plan: The patient at this point seems to be doing well. As stated, she did receive IV Feraheme 510 mg on June 19th and June 26th, 2013. She is on oral iron. Hemoglobin, hematocrit and red cell indices have improved.  We are waiting iron studies today.  Her CLL is stable. There was a suggestion of some splenomegaly. We may want to get an ultrasound of her abdomen at some point. She will probably need a 2D echocardiogram to evaluate her systolic ejection murmur. The patient needs to get a primary care doctor, a cardiologist and a GYN to treat her hypertension, heart murmur and menorrhagia respectively. The patient also indicated that she is going to find either a psychiatrist or psychologist to help with family counseling. The patient will return in 2 months at which time we will check CBC, chemistries, and iron studies. Referrals were made to Cardiology and GYN.   Aymee Fomby NP-C 05/18/2012, 11:47 AM

## 2012-05-18 NOTE — Telephone Encounter (Signed)
gv pt appt schedule for October and appts w/Dr, Francoise Ceo 8/22 @ 10:30 am (s/w Ms. Allen) and Dr. Jens Som 9/6 @ 11 am (s/w Lela). Gyn appt schedule w/Dr. Gaynell Face due to pt seen by him in the past. Fax cover sheet and referral sent to HIM to send notes to Dr. Gaynell Face.

## 2012-05-19 ENCOUNTER — Telehealth: Payer: Self-pay | Admitting: *Deleted

## 2012-05-19 NOTE — Telephone Encounter (Signed)
Attempting to call to discuss labs.

## 2012-05-21 ENCOUNTER — Telehealth: Payer: Self-pay | Admitting: *Deleted

## 2012-05-21 ENCOUNTER — Telehealth: Payer: Self-pay | Admitting: Medical Oncology

## 2012-05-21 NOTE — Telephone Encounter (Signed)
I called pt to let her know that Dr. Arline Asp would like for her to stop her oral Iron. He is going to set her up feraheme. The schedulers will call her with these appointments. She has received feraheme in the past.

## 2012-05-21 NOTE — Telephone Encounter (Signed)
1045--Called and left message for patient to call us back to discuss stopping oral iron, not doing much good per Dr Arline Asp. Need to set patient up on IV Feraheme. Patient to call us back for further details.

## 2012-05-25 ENCOUNTER — Telehealth: Payer: Self-pay | Admitting: Oncology

## 2012-05-25 ENCOUNTER — Telehealth: Payer: Self-pay

## 2012-05-25 NOTE — Telephone Encounter (Signed)
S/w the pt's husband and he is aware of the iron tx appt on tomorrow@8 :15am

## 2012-05-25 NOTE — Telephone Encounter (Signed)
Per staff message and POF I have scheduled appts. TMB 

## 2012-05-26 ENCOUNTER — Other Ambulatory Visit: Payer: Self-pay | Admitting: Oncology

## 2012-05-26 ENCOUNTER — Ambulatory Visit (HOSPITAL_BASED_OUTPATIENT_CLINIC_OR_DEPARTMENT_OTHER): Payer: BC Managed Care – PPO

## 2012-05-26 VITALS — BP 149/83 | HR 81 | Temp 98.4°F

## 2012-05-26 DIAGNOSIS — D509 Iron deficiency anemia, unspecified: Secondary | ICD-10-CM

## 2012-05-26 MED ORDER — FERUMOXYTOL INJECTION 510 MG/17 ML
510.0000 mg | Freq: Once | INTRAVENOUS | Status: AC
  Administered 2012-05-26: 510 mg via INTRAVENOUS
  Filled 2012-05-26: qty 17

## 2012-05-26 MED ORDER — SODIUM CHLORIDE 0.9 % IV SOLN
Freq: Once | INTRAVENOUS | Status: AC
  Administered 2012-05-26: 09:00:00 via INTRAVENOUS

## 2012-05-26 MED ORDER — HEPARIN SOD (PORK) LOCK FLUSH 100 UNIT/ML IV SOLN
500.0000 [IU] | Freq: Once | INTRAVENOUS | Status: DC | PRN
  Filled 2012-05-26: qty 5

## 2012-05-26 NOTE — Patient Instructions (Addendum)
[facility] Discharge Instructions for Patients Receiving Chemotherapy  Today you received the following chemotherapy agents :  Feraheme.  To help prevent nausea and vomiting after your treatment, we encourage you to take your nausea medication     If you develop nausea and vomiting that is not controlled by your nausea medication, call the clinic. If it is after clinic hours your family physician or the after hours number for the clinic or go to the Emergency Department.   BELOW ARE SYMPTOMS THAT SHOULD BE REPORTED IMMEDIATELY:  *FEVER GREATER THAN 101.0 F  *CHILLS WITH OR WITHOUT FEVER  NAUSEA AND VOMITING THAT IS NOT CONTROLLED WITH YOUR NAUSEA MEDICATION  *UNUSUAL SHORTNESS OF BREATH  *UNUSUAL BRUISING OR BLEEDING  TENDERNESS IN MOUTH AND THROAT WITH OR WITHOUT PRESENCE OF ULCERS  *URINARY PROBLEMS  *BOWEL PROBLEMS  UNUSUAL RASH Items with * indicate a potential emergency and should be followed up as soon as possible.  One of the nurses will contact you 24 hours after your treatment. Please let the nurse know about any problems that you may have experienced. Feel free to call the clinic you have any questions or concerns. The clinic phone number is [phone]   I have been informed and understand all the instructions given to me. I know to contact the clinic, my physician, or go to the Emergency Department if any problems should occur. I do not have any questions at this time, but understand that I may call the clinic during office hours or the Patient Navigator at [phone] should I have any questions or need assistance in obtaining follow up care.    __________________________________________  _____________  __________ Signature of Patient or Authorized Representative            Date                   Time    __________________________________________ Nurse's Signature

## 2012-06-08 ENCOUNTER — Ambulatory Visit: Payer: BC Managed Care – PPO

## 2012-06-11 ENCOUNTER — Ambulatory Visit: Payer: BC Managed Care – PPO | Admitting: Cardiology

## 2012-06-21 ENCOUNTER — Encounter: Payer: Self-pay | Admitting: Internal Medicine

## 2012-06-21 ENCOUNTER — Ambulatory Visit (INDEPENDENT_AMBULATORY_CARE_PROVIDER_SITE_OTHER): Payer: BC Managed Care – PPO | Admitting: Internal Medicine

## 2012-06-21 ENCOUNTER — Other Ambulatory Visit (INDEPENDENT_AMBULATORY_CARE_PROVIDER_SITE_OTHER): Payer: BC Managed Care – PPO

## 2012-06-21 VITALS — BP 144/92 | HR 74 | Temp 98.1°F | Resp 16 | Wt 175.0 lb

## 2012-06-21 DIAGNOSIS — M8580 Other specified disorders of bone density and structure, unspecified site: Secondary | ICD-10-CM | POA: Insufficient documentation

## 2012-06-21 DIAGNOSIS — Z0000 Encounter for general adult medical examination without abnormal findings: Secondary | ICD-10-CM | POA: Insufficient documentation

## 2012-06-21 DIAGNOSIS — M899 Disorder of bone, unspecified: Secondary | ICD-10-CM

## 2012-06-21 DIAGNOSIS — Z13220 Encounter for screening for lipoid disorders: Secondary | ICD-10-CM

## 2012-06-21 DIAGNOSIS — Z23 Encounter for immunization: Secondary | ICD-10-CM

## 2012-06-21 DIAGNOSIS — I10 Essential (primary) hypertension: Secondary | ICD-10-CM

## 2012-06-21 LAB — LDL CHOLESTEROL, DIRECT: Direct LDL: 160.6 mg/dL

## 2012-06-21 MED ORDER — AMLODIPINE-OLMESARTAN 5-40 MG PO TABS: 1.0000 | ORAL_TABLET | Freq: Every day | ORAL | Status: DC

## 2012-06-21 NOTE — Assessment & Plan Note (Signed)
Exam done, vaccines were updated, labs ordered, pt ed material was given 

## 2012-06-21 NOTE — Patient Instructions (Addendum)
Preventive Care for Adults, Female A healthy lifestyle and preventive care can promote health and wellness. Preventive health guidelines for women include the following key practices.  A routine yearly physical is a good way to check with your caregiver about your health and preventive screening. It is a chance to share any concerns and updates on your health, and to receive a thorough exam.   Visit your dentist for a routine exam and preventive care every 6 months. Brush your teeth twice a day and floss once a day. Good oral hygiene prevents tooth decay and gum disease.   The frequency of eye exams is based on your age, health, family medical history, use of contact lenses, and other factors. Follow your caregiver's recommendations for frequency of eye exams.   Eat a healthy diet. Foods like vegetables, fruits, whole grains, low-fat dairy products, and lean protein foods contain the nutrients you need without too many calories. Decrease your intake of foods high in solid fats, added sugars, and salt. Eat the right amount of calories for you.Get information about a proper diet from your caregiver, if necessary.   Regular physical exercise is one of the most important things you can do for your health. Most adults should get at least 150 minutes of moderate-intensity exercise (any activity that increases your heart rate and causes you to sweat) each week. In addition, most adults need muscle-strengthening exercises on 2 or more days a week.   Maintain a healthy weight. The body mass index (BMI) is a screening tool to identify possible weight problems. It provides an estimate of body fat based on height and weight. Your caregiver can help determine your BMI, and can help you achieve or maintain a healthy weight.For adults 20 years and older:   A BMI below 18.5 is considered underweight.   A BMI of 18.5 to 24.9 is normal.   A BMI of 25 to 29.9 is considered overweight.   A BMI of 30 and above is  considered obese.   Maintain normal blood lipids and cholesterol levels by exercising and minimizing your intake of saturated fat. Eat a balanced diet with plenty of fruit and vegetables. Blood tests for lipids and cholesterol should begin at age 20 and be repeated every 5 years. If your lipid or cholesterol levels are high, you are over 50, or you are at high risk for heart disease, you may need your cholesterol levels checked more frequently.Ongoing high lipid and cholesterol levels should be treated with medicines if diet and exercise are not effective.   If you smoke, find out from your caregiver how to quit. If you do not use tobacco, do not start.   If you are pregnant, do not drink alcohol. If you are breastfeeding, be very cautious about drinking alcohol. If you are not pregnant and choose to drink alcohol, do not exceed 1 drink per day. One drink is considered to be 12 ounces (355 mL) of beer, 5 ounces (148 mL) of wine, or 1.5 ounces (44 mL) of liquor.   Avoid use of street drugs. Do not share needles with anyone. Ask for help if you need support or instructions about stopping the use of drugs.   High blood pressure causes heart disease and increases the risk of stroke. Your blood pressure should be checked at least every 1 to 2 years. Ongoing high blood pressure should be treated with medicines if weight loss and exercise are not effective.   If you are 55 to 51   years old, ask your caregiver if you should take aspirin to prevent strokes.   Diabetes screening involves taking a blood sample to check your fasting blood sugar level. This should be done once every 3 years, after age 45, if you are within normal weight and without risk factors for diabetes. Testing should be considered at a younger age or be carried out more frequently if you are overweight and have at least 1 risk factor for diabetes.   Breast cancer screening is essential preventive care for women. You should practice "breast  self-awareness." This means understanding the normal appearance and feel of your breasts and may include breast self-examination. Any changes detected, no matter how small, should be reported to a caregiver. Women in their 20s and 30s should have a clinical breast exam (CBE) by a caregiver as part of a regular health exam every 1 to 3 years. After age 40, women should have a CBE every year. Starting at age 40, women should consider having a mammography (breast X-ray test) every year. Women who have a family history of breast cancer should talk to their caregiver about genetic screening. Women at a high risk of breast cancer should talk to their caregivers about having magnetic resonance imaging (MRI) and a mammography every year.   The Pap test is a screening test for cervical cancer. A Pap test can show cell changes on the cervix that might become cervical cancer if left untreated. A Pap test is a procedure in which cells are obtained and examined from the lower end of the uterus (cervix).   Women should have a Pap test starting at age 21.   Between ages 21 and 29, Pap tests should be repeated every 2 years.   Beginning at age 30, you should have a Pap test every 3 years as long as the past 3 Pap tests have been normal.   Some women have medical problems that increase the chance of getting cervical cancer. Talk to your caregiver about these problems. It is especially important to talk to your caregiver if a new problem develops soon after your last Pap test. In these cases, your caregiver may recommend more frequent screening and Pap tests.   The above recommendations are the same for women who have or have not gotten the vaccine for human papillomavirus (HPV).   If you had a hysterectomy for a problem that was not cancer or a condition that could lead to cancer, then you no longer need Pap tests. Even if you no longer need a Pap test, a regular exam is a good idea to make sure no other problems are  starting.   If you are between ages 65 and 70, and you have had normal Pap tests going back 10 years, you no longer need Pap tests. Even if you no longer need a Pap test, a regular exam is a good idea to make sure no other problems are starting.   If you have had past treatment for cervical cancer or a condition that could lead to cancer, you need Pap tests and screening for cancer for at least 20 years after your treatment.   If Pap tests have been discontinued, risk factors (such as a new sexual partner) need to be reassessed to determine if screening should be resumed.   The HPV test is an additional test that may be used for cervical cancer screening. The HPV test looks for the virus that can cause the cell changes on the cervix.   The cells collected during the Pap test can be tested for HPV. The HPV test could be used to screen women aged 30 years and older, and should be used in women of any age who have unclear Pap test results. After the age of 30, women should have HPV testing at the same frequency as a Pap test.   Colorectal cancer can be detected and often prevented. Most routine colorectal cancer screening begins at the age of 50 and continues through age 75. However, your caregiver may recommend screening at an earlier age if you have risk factors for colon cancer. On a yearly basis, your caregiver may provide home test kits to check for hidden blood in the stool. Use of a small camera at the end of a tube, to directly examine the colon (sigmoidoscopy or colonoscopy), can detect the earliest forms of colorectal cancer. Talk to your caregiver about this at age 50, when routine screening begins. Direct examination of the colon should be repeated every 5 to 10 years through age 75, unless early forms of pre-cancerous polyps or small growths are found.   Hepatitis C blood testing is recommended for all people born from 1945 through 1965 and any individual with known risks for hepatitis C.    Practice safe sex. Use condoms and avoid high-risk sexual practices to reduce the spread of sexually transmitted infections (STIs). STIs include gonorrhea, chlamydia, syphilis, trichomonas, herpes, HPV, and human immunodeficiency virus (HIV). Herpes, HIV, and HPV are viral illnesses that have no cure. They can result in disability, cancer, and death. Sexually active women aged 25 and younger should be checked for chlamydia. Older women with new or multiple partners should also be tested for chlamydia. Testing for other STIs is recommended if you are sexually active and at increased risk.   Osteoporosis is a disease in which the bones lose minerals and strength with aging. This can result in serious bone fractures. The risk of osteoporosis can be identified using a bone density scan. Women ages 65 and over and women at risk for fractures or osteoporosis should discuss screening with their caregivers. Ask your caregiver whether you should take a calcium supplement or vitamin D to reduce the rate of osteoporosis.   Menopause can be associated with physical symptoms and risks. Hormone replacement therapy is available to decrease symptoms and risks. You should talk to your caregiver about whether hormone replacement therapy is right for you.   Use sunscreen with sun protection factor (SPF) of 30 or more. Apply sunscreen liberally and repeatedly throughout the day. You should seek shade when your shadow is shorter than you. Protect yourself by wearing long sleeves, pants, a wide-brimmed hat, and sunglasses year round, whenever you are outdoors.   Once a month, do a whole body skin exam, using a mirror to look at the skin on your back. Notify your caregiver of new moles, moles that have irregular borders, moles that are larger than a pencil eraser, or moles that have changed in shape or color.   Stay current with required immunizations.   Influenza. You need a dose every fall (or winter). The composition of  the flu vaccine changes each year, so being vaccinated once is not enough.   Pneumococcal polysaccharide. You need 1 to 2 doses if you smoke cigarettes or if you have certain chronic medical conditions. You need 1 dose at age 65 (or older) if you have never been vaccinated.   Tetanus, diphtheria, pertussis (Tdap, Td). Get 1 dose of   Tdap vaccine if you are younger than age 65, are over 65 and have contact with an infant, are a healthcare worker, are pregnant, or simply want to be protected from whooping cough. After that, you need a Td booster dose every 10 years. Consult your caregiver if you have not had at least 3 tetanus and diphtheria-containing shots sometime in your life or have a deep or dirty wound.   HPV. You need this vaccine if you are a woman age 26 or younger. The vaccine is given in 3 doses over 6 months.   Measles, mumps, rubella (MMR). You need at least 1 dose of MMR if you were born in 1957 or later. You may also need a second dose.   Meningococcal. If you are age 19 to 21 and a first-year college student living in a residence hall, or have one of several medical conditions, you need to get vaccinated against meningococcal disease. You may also need additional booster doses.   Zoster (shingles). If you are age 60 or older, you should get this vaccine.   Varicella (chickenpox). If you have never had chickenpox or you were vaccinated but received only 1 dose, talk to your caregiver to find out if you need this vaccine.   Hepatitis A. You need this vaccine if you have a specific risk factor for hepatitis A virus infection or you simply wish to be protected from this disease. The vaccine is usually given as 2 doses, 6 to 18 months apart.   Hepatitis B. You need this vaccine if you have a specific risk factor for hepatitis B virus infection or you simply wish to be protected from this disease. The vaccine is given in 3 doses, usually over 6 months.  Preventive Services /  Frequency Ages 19 to 39  Blood pressure check.** / Every 1 to 2 years.   Lipid and cholesterol check.** / Every 5 years beginning at age 20.   Clinical breast exam.** / Every 3 years for women in their 20s and 30s.   Pap test.** / Every 2 years from ages 21 through 29. Every 3 years starting at age 30 through age 65 or 70 with a history of 3 consecutive normal Pap tests.   HPV screening.** / Every 3 years from ages 30 through ages 65 to 70 with a history of 3 consecutive normal Pap tests.   Hepatitis C blood test.** / For any individual with known risks for hepatitis C.   Skin self-exam. / Monthly.   Influenza immunization.** / Every year.   Pneumococcal polysaccharide immunization.** / 1 to 2 doses if you smoke cigarettes or if you have certain chronic medical conditions.   Tetanus, diphtheria, pertussis (Tdap, Td) immunization. / A one-time dose of Tdap vaccine. After that, you need a Td booster dose every 10 years.   HPV immunization. / 3 doses over 6 months, if you are 26 and younger.   Measles, mumps, rubella (MMR) immunization. / You need at least 1 dose of MMR if you were born in 1957 or later. You may also need a second dose.   Meningococcal immunization. / 1 dose if you are age 19 to 21 and a first-year college student living in a residence hall, or have one of several medical conditions, you need to get vaccinated against meningococcal disease. You may also need additional booster doses.   Varicella immunization.** / Consult your caregiver.   Hepatitis A immunization.** / Consult your caregiver. 2 doses, 6 to 18 months   apart.   Hepatitis B immunization.** / Consult your caregiver. 3 doses usually over 6 months.  Ages 40 to 64  Blood pressure check.** / Every 1 to 2 years.   Lipid and cholesterol check.** / Every 5 years beginning at age 20.   Clinical breast exam.** / Every year after age 40.   Mammogram.** / Every year beginning at age 40 and continuing for as  long as you are in good health. Consult with your caregiver.   Pap test.** / Every 3 years starting at age 30 through age 65 or 70 with a history of 3 consecutive normal Pap tests.   HPV screening.** / Every 3 years from ages 30 through ages 65 to 70 with a history of 3 consecutive normal Pap tests.   Fecal occult blood test (FOBT) of stool. / Every year beginning at age 50 and continuing until age 75. You may not need to do this test if you get a colonoscopy every 10 years.   Flexible sigmoidoscopy or colonoscopy.** / Every 5 years for a flexible sigmoidoscopy or every 10 years for a colonoscopy beginning at age 50 and continuing until age 75.   Hepatitis C blood test.** / For all people born from 1945 through 1965 and any individual with known risks for hepatitis C.   Skin self-exam. / Monthly.   Influenza immunization.** / Every year.   Pneumococcal polysaccharide immunization.** / 1 to 2 doses if you smoke cigarettes or if you have certain chronic medical conditions.   Tetanus, diphtheria, pertussis (Tdap, Td) immunization.** / A one-time dose of Tdap vaccine. After that, you need a Td booster dose every 10 years.   Measles, mumps, rubella (MMR) immunization. / You need at least 1 dose of MMR if you were born in 1957 or later. You may also need a second dose.   Varicella immunization.** / Consult your caregiver.   Meningococcal immunization.** / Consult your caregiver.   Hepatitis A immunization.** / Consult your caregiver. 2 doses, 6 to 18 months apart.   Hepatitis B immunization.** / Consult your caregiver. 3 doses, usually over 6 months.  Ages 65 and over  Blood pressure check.** / Every 1 to 2 years.   Lipid and cholesterol check.** / Every 5 years beginning at age 20.   Clinical breast exam.** / Every year after age 40.   Mammogram.** / Every year beginning at age 40 and continuing for as long as you are in good health. Consult with your caregiver.   Pap test.** /  Every 3 years starting at age 30 through age 65 or 70 with a 3 consecutive normal Pap tests. Testing can be stopped between 65 and 70 with 3 consecutive normal Pap tests and no abnormal Pap or HPV tests in the past 10 years.   HPV screening.** / Every 3 years from ages 30 through ages 65 or 70 with a history of 3 consecutive normal Pap tests. Testing can be stopped between 65 and 70 with 3 consecutive normal Pap tests and no abnormal Pap or HPV tests in the past 10 years.   Fecal occult blood test (FOBT) of stool. / Every year beginning at age 50 and continuing until age 75. You may not need to do this test if you get a colonoscopy every 10 years.   Flexible sigmoidoscopy or colonoscopy.** / Every 5 years for a flexible sigmoidoscopy or every 10 years for a colonoscopy beginning at age 50 and continuing until age 75.   Hepatitis   C blood test.** / For all people born from 1945 through 1965 and any individual with known risks for hepatitis C.   Osteoporosis screening.** / A one-time screening for women ages 65 and over and women at risk for fractures or osteoporosis.   Skin self-exam. / Monthly.   Influenza immunization.** / Every year.   Pneumococcal polysaccharide immunization.** / 1 dose at age 65 (or older) if you have never been vaccinated.   Tetanus, diphtheria, pertussis (Tdap, Td) immunization. / A one-time dose of Tdap vaccine if you are over 65 and have contact with an infant, are a healthcare worker, or simply want to be protected from whooping cough. After that, you need a Td booster dose every 10 years.   Varicella immunization.** / Consult your caregiver.   Meningococcal immunization.** / Consult your caregiver.   Hepatitis A immunization.** / Consult your caregiver. 2 doses, 6 to 18 months apart.   Hepatitis B immunization.** / Check with your caregiver. 3 doses, usually over 6 months.  ** Family history and personal history of risk and conditions may change your caregiver's  recommendations. Document Released: 11/18/2001 Document Revised: 09/11/2011 Document Reviewed: 02/17/2011 [employer] Patient Information 2012 [employer].Hypertension As your heart beats, it forces blood through your arteries. This force is your blood pressure. If the pressure is too high, it is called hypertension (HTN) or high blood pressure. HTN is dangerous because you may have it and not know it. High blood pressure may mean that your heart has to work harder to pump blood. Your arteries may be narrow or stiff. The extra work puts you at risk for heart disease, stroke, and other problems.  Blood pressure consists of two numbers, a higher number over a lower, 110/72, for example. It is stated as "110 over 72." The ideal is below 120 for the top number (systolic) and under 80 for the bottom (diastolic). Write down your blood pressure today. You should pay close attention to your blood pressure if you have certain conditions such as:  Heart failure.   Prior heart attack.   Diabetes   Chronic kidney disease.   Prior stroke.   Multiple risk factors for heart disease.  To see if you have HTN, your blood pressure should be measured while you are seated with your arm held at the level of the heart. It should be measured at least twice. A one-time elevated blood pressure reading (especially in the Emergency Department) does not mean that you need treatment. There may be conditions in which the blood pressure is different between your right and left arms. It is important to see your caregiver soon for a recheck. Most people have essential hypertension which means that there is not a specific cause. This type of high blood pressure may be lowered by changing lifestyle factors such as:  Stress.   Smoking.   Lack of exercise.   Excessive weight.   Drug/tobacco/alcohol use.   Eating less salt.  Most people do not have symptoms from high blood pressure until it has caused damage to the body.  Effective treatment can often prevent, delay or reduce that damage. TREATMENT  When a cause has been identified, treatment for high blood pressure is directed at the cause. There are a large number of medications to treat HTN. These fall into several categories, and your caregiver will help you select the medicines that are best for you. Medications may have side effects. You should review side effects with your caregiver. If your blood pressure   stays high after you have made lifestyle changes or started on medicines,   Your medication(s) may need to be changed.   Other problems may need to be addressed.   Be certain you understand your prescriptions, and know how and when to take your medicine.   Be sure to follow up with your caregiver within the time frame advised (usually within two weeks) to have your blood pressure rechecked and to review your medications.   If you are taking more than one medicine to lower your blood pressure, make sure you know how and at what times they should be taken. Taking two medicines at the same time can result in blood pressure that is too low.  SEEK IMMEDIATE MEDICAL CARE IF:  You develop a severe headache, blurred or changing vision, or confusion.   You have unusual weakness or numbness, or a faint feeling.   You have severe chest or abdominal pain, vomiting, or breathing problems.  MAKE SURE YOU:   Understand these instructions.   Will watch your condition.   Will get help right away if you are not doing well or get worse.  Document Released: 09/22/2005 Document Revised: 09/11/2011 Document Reviewed: 05/12/2008 [employer] Patient Information 2012 [employer]. 

## 2012-06-21 NOTE — Assessment & Plan Note (Signed)
For a DEXA scan and Vit D level

## 2012-06-21 NOTE — Progress Notes (Signed)
  Subjective:    Patient ID: Audrey Gross, female    DOB: [birth year], 51 y.o.   MRN: [id number]  Hypertension This is a chronic problem. The current episode started more than 1 year ago. The problem has been gradually worsening since onset. The problem is uncontrolled. Pertinent negatives include no anxiety, blurred vision, chest pain, headaches, malaise/fatigue, neck pain, orthopnea, palpitations, peripheral edema, PND, shortness of breath or sweats. Past treatments include nothing. The current treatment provides no improvement. Compliance problems include psychosocial issues, exercise and diet.       Review of Systems  Constitutional: Positive for fatigue. Negative for fever, chills, malaise/fatigue, diaphoresis, activity change, appetite change and unexpected weight change.  HENT: Negative.  Negative for neck pain.   Eyes: Negative.  Negative for blurred vision.  Respiratory: Negative for cough, chest tightness, shortness of breath and stridor.   Cardiovascular: Negative for chest pain, palpitations, orthopnea, leg swelling and PND.  Gastrointestinal: Negative for nausea, vomiting, abdominal pain, diarrhea, constipation, blood in stool and anal bleeding.  Genitourinary: Negative.   Musculoskeletal: Negative for myalgias, back pain, joint swelling, arthralgias and gait problem.  Skin: Negative for color change, pallor, rash and wound.  Neurological: Negative.  Negative for headaches.  Hematological: Negative for adenopathy. Does not bruise/bleed easily.  Psychiatric/Behavioral: Negative.        Objective:   Physical Exam  Vitals reviewed. Constitutional: She is oriented to person, place, and time. She appears well-developed and well-nourished. No distress.  HENT:  Head: Normocephalic and atraumatic.  Mouth/Throat: Oropharynx is clear and moist. No oropharyngeal exudate.  Eyes: Conjunctivae normal are normal. Right eye exhibits no discharge. Left eye exhibits no discharge. No scleral  icterus.  Neck: Normal range of motion. Neck supple. No JVD present. No tracheal deviation present. No thyromegaly present.  Cardiovascular: Normal rate, regular rhythm, normal heart sounds and intact distal pulses.  Exam reveals no gallop and no friction rub.   No murmur heard. Pulmonary/Chest: Effort normal and breath sounds normal. No stridor. No respiratory distress. She has no wheezes. She has no rales. She exhibits no tenderness.  Abdominal: Soft. Bowel sounds are normal. She exhibits no distension and no mass. There is no tenderness. There is no rebound and no guarding.  Musculoskeletal: Normal range of motion. She exhibits no edema and no tenderness.  Lymphadenopathy:    She has no cervical adenopathy.  Neurological: She is oriented to person, place, and time.  Skin: Skin is warm and dry. No rash noted. She is not diaphoretic. No erythema. No pallor.  Psychiatric: She has a normal mood and affect. Her behavior is normal. Judgment and thought content normal.     Lab Results  Component Value Date   WBC 14.7* 05/18/2012   HGB 11.9 05/18/2012   HCT 37.3 05/18/2012   PLT 265 05/18/2012   GLUCOSE 92 05/18/2012   ALT 9 05/18/2012   AST 14 05/18/2012   NA 139 05/18/2012   K 4.0 05/18/2012   CL 104 05/18/2012   CREATININE 0.90 05/18/2012   BUN 9 05/18/2012   CO2 28 05/18/2012       Assessment & Plan:

## 2012-06-21 NOTE — Assessment & Plan Note (Signed)
Start azor for better BP control

## 2012-06-22 ENCOUNTER — Ambulatory Visit (HOSPITAL_BASED_OUTPATIENT_CLINIC_OR_DEPARTMENT_OTHER): Payer: BC Managed Care – PPO

## 2012-06-22 ENCOUNTER — Telehealth: Payer: Self-pay | Admitting: Oncology

## 2012-06-22 VITALS — BP 147/83 | HR 68 | Temp 97.0°F

## 2012-06-22 DIAGNOSIS — D509 Iron deficiency anemia, unspecified: Secondary | ICD-10-CM

## 2012-06-22 LAB — HEPATITIS C ANTIBODY: HCV Ab: NEGATIVE

## 2012-06-22 MED ORDER — FERUMOXYTOL INJECTION 510 MG/17 ML
510.0000 mg | Freq: Once | INTRAVENOUS | Status: AC
  Administered 2012-06-22: 510 mg via INTRAVENOUS
  Filled 2012-06-22: qty 17

## 2012-06-22 NOTE — Telephone Encounter (Signed)
Pt came in as she had and iron trans. fus. today but had missed one from 9/3.  Checked with robin and she states that she does not need to do another iron before seeing dr dm     aom

## 2012-06-22 NOTE — Patient Instructions (Signed)
Ferumoxytol injection What is this medicine? FERUMOXYTOL is an iron complex. Iron is used to make healthy red blood cells, which carry oxygen and nutrients throughout the body. This medicine is used to treat iron deficiency anemia in people with chronic kidney disease. This medicine may be used for other purposes; ask your health care provider or pharmacist if you have questions. What should I tell my health care provider before I take this medicine? They need to know if you have any of these conditions: -anemia not caused by low iron levels -high levels of iron in the blood -magnetic resonance imaging (MRI) test scheduled -an unusual or allergic reaction to iron, other medicines, foods, dyes, or preservatives -pregnant or trying to get pregnant -breast-feeding How should I use this medicine? This medicine is for infusion into a vein. It is given by a health care professional in a hospital or clinic setting. Talk to your pediatrician regarding the use of this medicine in children. Special care may be needed. Overdosage: If you think you've taken too much of this medicine contact a poison control center or emergency room at once. Overdosage: If you think you have taken too much of this medicine contact a poison control center or emergency room at once. NOTE: This medicine is only for you. Do not share this medicine with others. What if I miss a dose? It is important not to miss your dose. Call your doctor or health care professional if you are unable to keep an appointment. What may interact with this medicine? This medicine may interact with the following medications: -other iron products This list may not describe all possible interactions. Give your health care provider a list of all the medicines, herbs, non-prescription drugs, or dietary supplements you use. Also tell them if you smoke, drink alcohol, or use illegal drugs. Some items may interact with your medicine. What should I watch  for while using this medicine? Visit your doctor or healthcare professional regularly. Tell your doctor or healthcare professional if your symptoms do not start to get better or if they get worse. You may need blood work done while you are taking this medicine. You may need to follow a special diet. Talk to your doctor. Foods that contain iron include: whole grains/cereals, dried fruits, beans, or peas, leafy green vegetables, and organ meats (liver, kidney). What side effects may I notice from receiving this medicine? Side effects that you should report to your doctor or health care professional as soon as possible: -allergic reactions like skin rash, itching or hives, swelling of the face, lips, or tongue -breathing problems -changes in blood pressure -feeling faint or lightheaded, falls -fever or chills -flushing, sweating, or hot feelings -swelling of the ankles or feet Side effects that usually do not require medical attention (Report these to your doctor or health care professional if they continue or are bothersome.): -diarrhea -headache -nausea, vomiting -stomach pain This list may not describe all possible side effects. Call your doctor for medical advice about side effects. You may report side effects to FDA at 1-800-FDA-1088. Where should I keep my medicine? This drug is given in a hospital or clinic and will not be stored at home. NOTE: This sheet is a summary. It may not cover all possible information. If you have questions about this medicine, talk to your doctor, pharmacist, or health care provider.  2012, [employer] Standard. (06/14/2008 9:48:25 PM) 

## 2012-06-25 LAB — VITAMIN D 1,25 DIHYDROXY
Vitamin D 1, 25 (OH)2 Total: 63 pg/mL (ref 18–72)
Vitamin D3 1, 25 (OH)2: 63 pg/mL

## 2012-06-27 ENCOUNTER — Encounter: Payer: Self-pay | Admitting: Internal Medicine

## 2012-07-20 ENCOUNTER — Ambulatory Visit (HOSPITAL_BASED_OUTPATIENT_CLINIC_OR_DEPARTMENT_OTHER): Payer: BC Managed Care – PPO | Admitting: Oncology

## 2012-07-20 ENCOUNTER — Encounter: Payer: Self-pay | Admitting: Oncology

## 2012-07-20 ENCOUNTER — Other Ambulatory Visit (HOSPITAL_BASED_OUTPATIENT_CLINIC_OR_DEPARTMENT_OTHER): Payer: BC Managed Care – PPO | Admitting: Lab

## 2012-07-20 ENCOUNTER — Telehealth: Payer: Self-pay | Admitting: *Deleted

## 2012-07-20 VITALS — BP 159/92 | HR 66 | Temp 98.6°F | Resp 20 | Wt 175.3 lb

## 2012-07-20 DIAGNOSIS — D509 Iron deficiency anemia, unspecified: Secondary | ICD-10-CM

## 2012-07-20 DIAGNOSIS — C9110 Chronic lymphocytic leukemia of B-cell type not having achieved remission: Secondary | ICD-10-CM

## 2012-07-20 LAB — CBC WITH DIFFERENTIAL/PLATELET
BASO%: 1 % (ref 0.0–2.0)
EOS%: 0.5 % (ref 0.0–7.0)
LYMPH%: 85.3 % — ABNORMAL HIGH (ref 14.0–49.7)
MCH: 29.5 pg (ref 25.1–34.0)
MCHC: 32.5 g/dL (ref 31.5–36.0)
MONO#: 0.1 10*3/uL (ref 0.1–0.9)
NEUT%: 12.8 % — ABNORMAL LOW (ref 38.4–76.8)
Platelets: 267 10*3/uL (ref 145–400)
RBC: 3.89 10*6/uL (ref 3.70–5.45)
WBC: 21.7 10*3/uL — ABNORMAL HIGH (ref 3.9–10.3)
lymph#: 18.5 10*3/uL — ABNORMAL HIGH (ref 0.9–3.3)

## 2012-07-20 LAB — COMPREHENSIVE METABOLIC PANEL (CC13)
ALT: 29 U/L (ref 0–55)
CO2: 23 mEq/L (ref 22–29)
Chloride: 108 mEq/L — ABNORMAL HIGH (ref 98–107)
Potassium: 3.6 mEq/L (ref 3.5–5.1)
Sodium: 140 mEq/L (ref 136–145)
Total Bilirubin: 0.6 mg/dL (ref 0.20–1.20)
Total Protein: 6.8 g/dL (ref 6.4–8.3)

## 2012-07-20 LAB — IRON AND TIBC: TIBC: 236 ug/dL — ABNORMAL LOW (ref 250–470)

## 2012-07-20 LAB — LACTATE DEHYDROGENASE (CC13): LDH: 214 U/L (ref 125–220)

## 2012-07-20 LAB — TECHNOLOGIST REVIEW

## 2012-07-20 NOTE — Telephone Encounter (Signed)
Gave patient appointment for 10-19-2012 starting at 9:00am

## 2012-07-20 NOTE — Progress Notes (Signed)
This office note has been dictated.  #[id number]

## 2012-07-21 NOTE — Progress Notes (Signed)
CC:   Sanda Linger, MD Rachael Fee, MD Enzo Bi, PsyD Kathreen Cosier, M.D.  PROBLEM LIST:  1. Chronic lymphocytic leukemia first diagnosed in February 2010,  stage 0. Flow cytometry was carried out on 11/20/2008. No prior history of treatment.  2. Iron-deficiency anemia which the patient states she has had for  years. When we first saw the patient 3 years ago in April 2010,  her ferritin was 3, her hemoglobin was 10.3, and she responded to  oral iron. IV Feraheme 510 mg was given on 03/24/2012, 03/31/2012, 05/26/2012 and 06/22/2012. 3. Long history of hypertension.  4. History of breast cysts. The patient had a mammogram at [facility] on 10/09/2011 with a cyst aspiration which was not sent for cytology.  5. Recent history of depression.  6. Systolic ejection murmur.  7. History of poor medical compliance.    MEDICATIONS:   1. Azor (amlodipine-olmesartan) 5/40 mg 1 tablet daily.  This was started in mid September 2013.   IMMUNIZATIONS: 1. Pneumovax was given on 06/21/2012. 2. Flu shot, we need to inquire about this.   SMOKING HISTORY:  The patient has never smoked cigarettes.   HISTORY:  Audrey Gross was seen today for followup of her chronic lymphocytic leukemia and iron-deficiency anemia.  The patient is here today with her husband, Majel Melo.  It will be recalled that she was last seen by [place] on 05/18/2012 and prior to that on 03/18/2012.  The patient states that she has seen Dr. Yetta Barre a month ago and was started on a new blood pressure medicine.  She has also seen Dr. Francoise Ceo.  She is continuing to have periods which she describes as average.  She completed her most recent period a week ago.  The patient was found to have a ferritin of 67 following her last visit on 05/18/2012.  We arranged for her to receive IV Feraheme 510 mg on 05/26/2012 and again on 06/22/2012.  The patient tolerated this well without any problems.  She denies  pagophasia.  She denies any pain.  Energy is okay.  She denies being depressed.  She is without any significant complaints today.  PHYSICAL EXAMINATION:  She looks well.  Affect is a little flat.  Weight is 175.3 pounds, height 67 inches, blood pressure 159/92.  Other vital signs are normal.  There is no scleral icterus.  Mouth and pharynx are benign.  There is no peripheral adenopathy palpable.  No axillary or inguinal adenopathy.  Lungs:  Clear to percussion and auscultation. Cardiac:  Regular rhythm with systolic ejection murmur.  Abdomen is benign with no organomegaly or masses palpable.  Breasts were not examined.  Extremities:  No peripheral edema or clubbing.  Neurologic Exam: Was normal.  LABORATORY DATA:  Today, white count 21.7 as compared with 14.7 on 05/18/2012.  ANC was 2.8, hemoglobin 11.5, hematocrit 35.4, platelets 267,000.  Red cell indices were normal.  Absolute lymphocyte count was 18.5.  Chemistries today were normal including an LDH of 214, albumin 4.2.  Iron studies today are pending.  Ferritin was 67 and iron saturation 29% on 05/18/2012.  IMAGING STUDIES: The only imaging study that we have available is  mammogram from 10/09/2011 which reads as follows: Breast tissue is  heterogeneously dense. There is a palpable area in the superior left  breast that was marked with a metallic BB. Multiple bilateral benign-  looking obscured masses were seen. There was scattered benign-looking  calcifications seen bilaterally more on the  right. No suspicious  calcifications or architectural distortion. On ultrasound, the  concerning area which is located at the 12 o'clock position in the left  breast. There were 2 adjacent simple cysts seen measuring 3.5 cm in  maximum diameter. Multiple cysts were seen in both breasts. There were  no suspicions looking masses, abnormal shadowing, or other features  suggestive for malignancy and a yearly mammogram was recommended.  The  patient underwent a cyst aspiration on 10/09/2011; this was not sent for cytology.   IMPRESSION AND PLAN:  The patient is clinically stable.  She apparently was presented with some options regarding management of her periods. The patient is a little vague about what those options were.  Apparently she is on no hormonal treatment to try to control her losses.  I explained to her that she will more than likely require ongoing iron infusions. At the present time she is requiring about 510 mg of Feraheme every 4-6 weeks.  We await the results of iron studies today. White count today was a little higher, but everything else is stable.  We need to inquire as to whether the patient needs a flu shot.  Apparently she did receive Pneumovax from Dr. Yetta Barre on September 16th.  I have asked Divina to return in 3 months,  which will be mid January. We will check CBC, chemistries, and iron studies.  She can see Norina Buzzard on that visit.    ______________________________ Samul Dada, M.D. DSM/MEDQ  D:  07/20/2012  T:  07/21/2012  Job:  [id number]

## 2012-07-22 ENCOUNTER — Encounter: Payer: Self-pay | Admitting: Internal Medicine

## 2012-07-22 ENCOUNTER — Ambulatory Visit (INDEPENDENT_AMBULATORY_CARE_PROVIDER_SITE_OTHER): Payer: BC Managed Care – PPO | Admitting: Internal Medicine

## 2012-07-22 VITALS — BP 148/98 | HR 70 | Temp 97.5°F | Resp 16 | Wt 179.5 lb

## 2012-07-22 DIAGNOSIS — R011 Cardiac murmur, unspecified: Secondary | ICD-10-CM

## 2012-07-22 DIAGNOSIS — Z23 Encounter for immunization: Secondary | ICD-10-CM

## 2012-07-22 DIAGNOSIS — I10 Essential (primary) hypertension: Secondary | ICD-10-CM

## 2012-07-22 DIAGNOSIS — E785 Hyperlipidemia, unspecified: Secondary | ICD-10-CM

## 2012-07-22 MED ORDER — ROSUVASTATIN CALCIUM 10 MG PO TABS: 10.0000 mg | ORAL_TABLET | Freq: Every day | ORAL | Status: DC

## 2012-07-22 MED ORDER — OLMESARTAN-AMLODIPINE-HCTZ 40-5-12.5 MG PO TABS: 1.0000 | ORAL_TABLET | Freq: Every day | ORAL | Status: DC

## 2012-07-22 NOTE — Assessment & Plan Note (Signed)
I have ordered an ECHO - it sounds like she has AS

## 2012-07-22 NOTE — Assessment & Plan Note (Signed)
Her BP is not well controlled so I have asked her to upgrade to tribenzor

## 2012-07-22 NOTE — Progress Notes (Signed)
  Subjective:    Patient ID: Audrey Gross, female    DOB: December 03, 1960, 51 y.o.   MRN: [id number]  Hypertension This is a chronic problem. The current episode started more than 1 year ago. The problem has been gradually worsening since onset. The problem is uncontrolled. Pertinent negatives include no anxiety, blurred vision, chest pain, headaches, malaise/fatigue, neck pain, orthopnea, palpitations, peripheral edema, PND, shortness of breath or sweats. Past treatments include angiotensin blockers and calcium channel blockers. The current treatment provides mild improvement. Compliance problems include exercise and diet.       Review of Systems  Constitutional: Negative for fever, chills, malaise/fatigue, diaphoresis, activity change, appetite change, fatigue and unexpected weight change.  HENT: Negative.  Negative for neck pain.   Eyes: Negative.  Negative for blurred vision.  Respiratory: Negative for cough, choking, chest tightness, shortness of breath, wheezing and stridor.   Cardiovascular: Negative for chest pain, palpitations, orthopnea, leg swelling and PND.  Gastrointestinal: Negative for nausea, vomiting, abdominal pain, diarrhea, constipation and anal bleeding.  Genitourinary: Negative.   Musculoskeletal: Negative for myalgias, back pain, joint swelling, arthralgias and gait problem.  Skin: Negative for color change, pallor, rash and wound.  Neurological: Negative for dizziness, syncope, facial asymmetry, speech difficulty, weakness, light-headedness and headaches.  Hematological: Negative for adenopathy. Does not bruise/bleed easily.  Psychiatric/Behavioral: Negative.        Objective:   Physical Exam  Vitals reviewed. Constitutional: She is oriented to person, place, and time. She appears well-developed and well-nourished. No distress.  HENT:  Head: Normocephalic and atraumatic.  Mouth/Throat: Oropharynx is clear and moist. No oropharyngeal exudate.  Eyes: Conjunctivae  normal are normal. Right eye exhibits no discharge. Left eye exhibits no discharge. No scleral icterus.  Neck: Normal range of motion. Neck supple. No JVD present. No tracheal deviation present. No thyromegaly present.  Cardiovascular: Normal rate, regular rhythm, S1 normal, S2 normal, intact distal pulses and normal pulses.  Exam reveals no gallop, no S3, no S4, no distant heart sounds and no friction rub.   Murmur heard.  Decrescendo systolic murmur is present with a grade of 1/6   No diastolic murmur is present  Pulmonary/Chest: Effort normal and breath sounds normal. No stridor. No respiratory distress. She has no wheezes. She has no rales. She exhibits no tenderness.  Abdominal: Soft. Bowel sounds are normal. She exhibits no distension and no mass. There is no tenderness. There is no rebound and no guarding.  Musculoskeletal: Normal range of motion. She exhibits no edema and no tenderness.  Lymphadenopathy:    She has no cervical adenopathy.  Neurological: She is oriented to person, place, and time.  Skin: Skin is warm and dry. No rash noted. She is not diaphoretic. No erythema. No pallor.  Psychiatric: She has a normal mood and affect. Her behavior is normal. Judgment and thought content normal.     Lab Results  Component Value Date   WBC 21.7* 07/20/2012   HGB 11.5* 07/20/2012   HCT 35.4 07/20/2012   PLT 267 07/20/2012   GLUCOSE 85 07/20/2012   CHOL 250* 06/21/2012   TRIG 108.0 06/21/2012   HDL 70.90 06/21/2012   LDLDIRECT 160.6 06/21/2012   ALT 29 07/20/2012   AST 21 07/20/2012   NA 140 07/20/2012   K 3.6 07/20/2012   CL 108* 07/20/2012   CREATININE 0.7 07/20/2012   BUN 8.0 07/20/2012   CO2 23 07/20/2012       Assessment & Plan:

## 2012-07-22 NOTE — Assessment & Plan Note (Signed)
Start crestor 

## 2012-07-22 NOTE — Patient Instructions (Signed)

## 2012-07-26 ENCOUNTER — Ambulatory Visit (INDEPENDENT_AMBULATORY_CARE_PROVIDER_SITE_OTHER)
Admission: RE | Admit: 2012-07-26 | Discharge: 2012-07-26 | Disposition: A | Discharge status: Home / Self Care | Payer: BC Managed Care – PPO | Source: Ambulatory Visit

## 2012-07-26 DIAGNOSIS — M899 Disorder of bone, unspecified: Secondary | ICD-10-CM

## 2012-07-26 DIAGNOSIS — M8580 Other specified disorders of bone density and structure, unspecified site: Secondary | ICD-10-CM

## 2012-07-26 DIAGNOSIS — M949 Disorder of cartilage, unspecified: Secondary | ICD-10-CM

## 2012-07-29 ENCOUNTER — Ambulatory Visit (HOSPITAL_COMMUNITY): Payer: BC Managed Care – PPO | Attending: Cardiovascular Disease | Admitting: Radiology

## 2012-07-29 ENCOUNTER — Other Ambulatory Visit: Payer: Self-pay

## 2012-07-29 ENCOUNTER — Encounter: Payer: Self-pay | Admitting: Internal Medicine

## 2012-07-29 DIAGNOSIS — I379 Nonrheumatic pulmonary valve disorder, unspecified: Secondary | ICD-10-CM | POA: Insufficient documentation

## 2012-07-29 DIAGNOSIS — R011 Cardiac murmur, unspecified: Secondary | ICD-10-CM

## 2012-07-29 DIAGNOSIS — I369 Nonrheumatic tricuspid valve disorder, unspecified: Secondary | ICD-10-CM | POA: Insufficient documentation

## 2012-07-29 DIAGNOSIS — I10 Essential (primary) hypertension: Secondary | ICD-10-CM | POA: Insufficient documentation

## 2012-07-29 DIAGNOSIS — I059 Rheumatic mitral valve disease, unspecified: Secondary | ICD-10-CM | POA: Insufficient documentation

## 2012-07-29 NOTE — Progress Notes (Signed)
Echocardiogram performed.  

## 2012-08-09 ENCOUNTER — Encounter: Payer: Self-pay | Admitting: Internal Medicine

## 2012-08-09 LAB — HM DEXA SCAN: HM Dexa Scan: -0.4

## 2012-09-22 ENCOUNTER — Ambulatory Visit: Payer: BC Managed Care – PPO | Admitting: Internal Medicine

## 2012-10-13 ENCOUNTER — Ambulatory Visit: Payer: BC Managed Care – PPO | Admitting: Internal Medicine

## 2012-10-13 DIAGNOSIS — Z0289 Encounter for other administrative examinations: Secondary | ICD-10-CM

## 2012-10-19 ENCOUNTER — Ambulatory Visit (HOSPITAL_BASED_OUTPATIENT_CLINIC_OR_DEPARTMENT_OTHER): Payer: BC Managed Care – PPO | Admitting: Family

## 2012-10-19 ENCOUNTER — Other Ambulatory Visit: Payer: Self-pay | Admitting: Oncology

## 2012-10-19 ENCOUNTER — Encounter: Payer: Self-pay | Admitting: Oncology

## 2012-10-19 ENCOUNTER — Telehealth: Payer: Self-pay | Admitting: *Deleted

## 2012-10-19 ENCOUNTER — Encounter: Payer: Self-pay | Admitting: Family

## 2012-10-19 ENCOUNTER — Other Ambulatory Visit (HOSPITAL_BASED_OUTPATIENT_CLINIC_OR_DEPARTMENT_OTHER): Payer: BC Managed Care – PPO | Admitting: Lab

## 2012-10-19 ENCOUNTER — Telehealth: Payer: Self-pay | Admitting: Oncology

## 2012-10-19 VITALS — BP 177/93 | HR 70 | Temp 98.5°F | Resp 20 | Ht 67.0 in | Wt 181.9 lb

## 2012-10-19 DIAGNOSIS — C9110 Chronic lymphocytic leukemia of B-cell type not having achieved remission: Secondary | ICD-10-CM

## 2012-10-19 DIAGNOSIS — D509 Iron deficiency anemia, unspecified: Secondary | ICD-10-CM

## 2012-10-19 LAB — COMPREHENSIVE METABOLIC PANEL (CC13)
AST: 14 U/L (ref 5–34)
Albumin: 4.1 g/dL (ref 3.5–5.0)
Alkaline Phosphatase: 41 U/L (ref 40–150)
BUN: 7 mg/dL (ref 7.0–26.0)
Creatinine: 0.7 mg/dL (ref 0.6–1.1)
Glucose: 80 mg/dl (ref 70–99)
Total Bilirubin: 0.66 mg/dL (ref 0.20–1.20)

## 2012-10-19 LAB — TECHNOLOGIST REVIEW

## 2012-10-19 LAB — IRON AND TIBC
%SAT: 19 % — ABNORMAL LOW (ref 20–55)
Iron: 55 ug/dL (ref 42–145)
UIBC: 232 ug/dL (ref 125–400)

## 2012-10-19 LAB — CBC WITH DIFFERENTIAL/PLATELET
BASO%: 1 % (ref 0.0–2.0)
EOS%: 0.3 % (ref 0.0–7.0)
HCT: 36 % (ref 34.8–46.6)
HGB: 11.9 g/dL (ref 11.6–15.9)
MCH: 30.2 pg (ref 25.1–34.0)
MCHC: 33.1 g/dL (ref 31.5–36.0)
MONO#: 0.1 10*3/uL (ref 0.1–0.9)
NEUT%: 12.1 % — ABNORMAL LOW (ref 38.4–76.8)
RDW: 12.9 % (ref 11.2–14.5)
WBC: 25 10*3/uL — ABNORMAL HIGH (ref 3.9–10.3)
lymph#: 21.5 10*3/uL — ABNORMAL HIGH (ref 0.9–3.3)

## 2012-10-19 LAB — FERRITIN: Ferritin: 62 ng/mL (ref 10–291)

## 2012-10-19 NOTE — Telephone Encounter (Signed)
[facility] stating the pt's BP today was 177/93. They talked with her about medication compliance. They wanted Dr Yetta Barre to be aware of this.

## 2012-10-19 NOTE — Patient Instructions (Addendum)
Results for orders placed in visit on 10/19/12 (from the past 24 hour(s))  CBC WITH DIFFERENTIAL     Status: Abnormal   Collection Time   10/19/12  9:14 AM      Component Value Range   WBC 25.0 (*) 3.9 - 10.3 10e3/uL   NEUT# 3.0  1.5 - 6.5 10e3/uL   HGB 11.9  11.6 - 15.9 g/dL   HCT 16.1  09.6 - 04.5 %   Platelets 235  145 - 400 10e3/uL   MCV 91.2  79.5 - 101.0 fL   MCH 30.2  25.1 - 34.0 pg   MCHC 33.1  31.5 - 36.0 g/dL   RBC 4.09  8.11 - 9.14 10e6/uL   RDW 12.9  11.2 - 14.5 %   lymph# 21.5 (*) 0.9 - 3.3 10e3/uL   MONO# 0.1  0.1 - 0.9 10e3/uL   Eosinophils Absolute 0.1  0.0 - 0.5 10e3/uL   Basophils Absolute 0.3 (*) 0.0 - 0.1 10e3/uL   NEUT% 12.1 (*) 38.4 - 76.8 %   LYMPH% 86.1 (*) 14.0 - 49.7 %   MONO% 0.5  0.0 - 14.0 %   EOS% 0.3  0.0 - 7.0 %   BASO% 1.0  0.0 - 2.0 %   Narrative:    Performed At:  [facility]               [address]               [place], [place] [address]  COMPREHENSIVE METABOLIC PANEL (CC13)     Status: Normal   Collection Time   10/19/12  9:14 AM      Component Value Range   Sodium 139  136 - 145 mEq/L   Potassium 3.5  3.5 - 5.1 mEq/L   Chloride 106  98 - 107 mEq/L   CO2 24  22 - 29 mEq/L   Glucose 80  70 - 99 mg/dl   BUN 7.0  7.0 - 62.1 mg/dL   Creatinine 0.7  0.6 - 1.1 mg/dL   Total Bilirubin 3.08  0.20 - 1.20 mg/dL   Alkaline Phosphatase 41  40 - 150 U/L   AST 14  5 - 34 U/L   ALT 9  0 - 55 U/L   Total Protein 7.6  6.4 - 8.3 g/dL   Albumin 4.1  3.5 - 5.0 g/dL   Calcium 9.2  8.4 - 65.7 mg/dL   Narrative:    Has the patient fasted?->NoPerformed At:  [facility]               [address]               [place], [place] [address]  LACTATE DEHYDROGENASE (CC13)     Status: Normal   Collection Time   10/19/12  9:14 AM      Component Value Range   LDH 227  125 - 245 U/L   Narrative:    Performed At:  [facility]               [address]               [place], [place] [address]  TECHNOLOGIST REVIEW     Status:  Normal   Collection Time   10/19/12  9:14 AM      Component Value Range   Technologist Review Variant lymphs present, Oc smudge cells     Narrative:  Items were attached to this order: Tech ReviewThis order was split into 4 orders:  (CBC & Diff,  Venipuncture [i] ), (Ferritin,Iron and TIBC), (Comprehensive Metabolic Panel), (LDH)Performed At:  [facility]               [address]                [place], [place] [address]  Please contact us at [phone] if you have any questions or concerns.

## 2012-10-19 NOTE — Progress Notes (Unsigned)
This patient's ferritin came back today, 10/19/2012, at 62. She had received Feraheme IV 510 mg on 05/26/2012 and 06/22/2012.  Ferritin values on 07/20/2012 was 293. On 05/18/2012,--- 67 and on 03/18/2012,--- 5.  We're planning to give this patient IV Feraheme 1020 mg on 10/26/2012.    She had a normal colonoscopy by Dr. Wendall Papa on 04/07/2012.   She has an appointment to see me on 01/18/2013 with labs.

## 2012-10-19 NOTE — Progress Notes (Signed)
Patient ID: Audrey Gross, female   DOB: 07-12-1961, 52 y.o.   MRN: [id number] CSN: [id number]  CC: Audrey Linger, MD  Audrey Fee, MD  Audrey Bi, PsyD  Audrey Cosier, MD   Problem List: Audrey Gross is a 52 y.o. African-American female with a problem list consisting of:  1. Chronic lymphocytic leukemia first diagnosed in February 2010, stage 0. Flow cytometry was carried out on 11/20/2008. No prior history of treatment.  2. Iron-deficiency anemia which the patient states she has had for years. When we first saw the patient 3 years ago in April 2010,  her ferritin was 3, her hemoglobin was 10.3, and she responded to oral iron. IV Feraheme 510 mg was given on 03/24/2012, 03/31/2012, 05/26/2012 and 06/22/2012.  3. Long history of hypertension 4. History of breast cysts. The patient had a mammogram at [facility] on 10/09/2011 with a cyst aspiration which was not sent for cytology.  Mammogram is currently due. 5. Recent history of depression 6. Systolic ejection murmur 7. History of poor medical compliance  Dr. Arline Asp and I saw [name] today for follow up of her chronic lymphocytic leukemia and iron-deficiency anemia.  She was last seen by [place] on 07/20/2012.  Audrey Gross' blood pressure was again elevated during today's office visit at 177/93.  The patient states that she has been compliant with her medications, including her blood pressure medication.  She denies any headaches.  I reported the elevated BP to [place], [place] at Dr. Sanda Gross' office today.  Audrey Gross continues to have heavy menstrual cycles every month that she states last 4-5 days and of those days, she has 3-4 days of heavy bleeding.  Audrey Gross states that these are normal menstrual cycles for her and she has already spoken to Dr. Francoise Ceo about this. Audrey Gross has not required a IV Feraheme infusion since 06/22/2012.  She denies pagophagia. She denies any symptomatology today except for  occasional night sweats.  She is without any significant complaints.   Past Medical History: Past Medical History  Diagnosis Date  . Hypertension   . CLL (chronic lymphoblastic leukemia)   . Depression   . Iron deficiency anemia, unspecified   . Diffuse cystic mastopathy   . Cystic breast   . Systolic ejection murmur   . Medical non-compliance     Surgical History: Past Surgical History  Procedure Date  . Tubal ligation     Current Medications: Current Outpatient Prescriptions  Medication Sig Dispense Refill  . Olmesartan-Amlodipine-HCTZ (TRIBENZOR) 40-5-12.5 MG TABS Take 1 tablet by mouth daily.  70 tablet  0  . rosuvastatin (CRESTOR) 10 MG tablet Take 1 tablet (10 mg total) by mouth daily.  126 tablet  0   Immunizations:  1. Pneumovax was given on 06/21/2012.  2. Influenza vaccination was given in October 2013 per Ms. Gross.   Allergies: No Known Allergies  Family History: Family History  Problem Relation Age of Onset  . Kidney disease Father   . Stomach cancer Maternal Aunt     Social History: History  Substance Use Topics  . Smoking status: Never Smoker   . Smokeless tobacco: Never Used  . Alcohol Use: No    Review of Systems: 10 Point review of systems was completed and is negative except as noted above.   Physical Exam:   Blood pressure 177/93, pulse 70, temperature 98.5 F (36.9 C), temperature source Oral, resp. rate 20, height 5\' 7"  (1.702 m), weight  181 lb 14.4 oz (82.509 kg).  General appearance: Alert, cooperative, well nourished, no apparent distress Head: Normocephalic, without obvious abnormality, atraumatic  Eyes: Conjunctivae/corneas are clear, PERRLA, EOMI, sclerae  injected left eye > right eye Nose: Nares, septum and mucosa are normal, no drainage or sinus tenderness  Throat: Lips, mucosa, and tongue normal, gums are normal, teeth have dental caries present  Neck: No adenopathy, supple, symmetrical, trachea midline, thyroid not  enlarged, no tenderness  Back: Symmetric, ROM normal, no CVA tenderness  Resp: Clear to auscultation bilaterally  Cardio: Systolic murmur 2/6, S1/S2 normal  GI: Soft, non-tender, hypoactive bowel sounds, distended, no organomegaly, well healed surgical scar  Extremities: Extremities normal, atraumatic, no cyanosis or edema  Neurologic: Grossly normal  Psych:  Flat affect   Laboratory Data: Results for orders placed in visit on 10/19/12 (from the past 48 hour(s))  CBC WITH DIFFERENTIAL     Status: Abnormal   Collection Time   10/19/12  9:14 AM      Component Value Range Comment   WBC 25.0 (*) 3.9 - 10.3 10e3/uL    NEUT# 3.0  1.5 - 6.5 10e3/uL    HGB 11.9  11.6 - 15.9 g/dL    HCT 14.7  82.9 - 56.2 %    Platelets 235  145 - 400 10e3/uL    MCV 91.2  79.5 - 101.0 fL    MCH 30.2  25.1 - 34.0 pg    MCHC 33.1  31.5 - 36.0 g/dL    RBC 1.30  8.65 - 7.84 10e6/uL    RDW 12.9  11.2 - 14.5 %    lymph# 21.5 (*) 0.9 - 3.3 10e3/uL    MONO# 0.1  0.1 - 0.9 10e3/uL    Eosinophils Absolute 0.1  0.0 - 0.5 10e3/uL    Basophils Absolute 0.3 (*) 0.0 - 0.1 10e3/uL    NEUT% 12.1 (*) 38.4 - 76.8 %    LYMPH% 86.1 (*) 14.0 - 49.7 %    MONO% 0.5  0.0 - 14.0 %    EOS% 0.3  0.0 - 7.0 %    BASO% 1.0  0.0 - 2.0 %   COMPREHENSIVE METABOLIC PANEL (CC13)     Status: Normal   Collection Time   10/19/12  9:14 AM      Component Value Range Comment   Sodium 139  136 - 145 mEq/L    Potassium 3.5  3.5 - 5.1 mEq/L    Chloride 106  98 - 107 mEq/L    CO2 24  22 - 29 mEq/L    Glucose 80  70 - 99 mg/dl    BUN 7.0  7.0 - 69.6 mg/dL    Creatinine 0.7  0.6 - 1.1 mg/dL    Total Bilirubin 2.95  0.20 - 1.20 mg/dL    Alkaline Phosphatase 41  40 - 150 U/L    AST 14  5 - 34 U/L    ALT 9  0 - 55 U/L    Total Protein 7.6  6.4 - 8.3 g/dL    Albumin 4.1  3.5 - 5.0 g/dL    Calcium 9.2  8.4 - 28.4 mg/dL   LACTATE DEHYDROGENASE (CC13)     Status: Normal   Collection Time   10/19/12  9:14 AM      Component Value Range Comment    LDH 227  125 - 245 U/L   TECHNOLOGIST REVIEW     Status: Normal   Collection Time   10/19/12  9:14  AM      Component Value Range Comment   Technologist Review Variant lymphs present, Oc smudge cells      Procedures:   1. Colonoscopy on 04/07/2012 showed a normal colon, no polyps or cancer.  Next colonoscopy recommended in 10 years.  Imaging Studies: The only imaging study that we have available is mammogram from 10/09/2011 which reads as follows: Breast tissue is heterogeneously dense. There is a palpable area in the superior left breast that was marked with a metallic BB. Multiple bilateral benign-  looking obscured masses were seen. There was scattered benign-looking calcifications seen bilaterally more on the right. No suspicious calcifications or architectural distortion. On ultrasound, the concerning area which is located at the 12 o'clock position in the left breast. There were 2 adjacent simple cysts seen measuring 3.5 cm in maximum diameter. Multiple cysts were seen in both breasts. There were no suspicions looking masses, abnormal shadowing, or other features suggestive for malignancy and a yearly mammogram was recommended. The patient underwent a cyst aspiration on 10/09/2011; this was not sent for cytology.  Audrey Gross was reminded today that her mammogram is now due.   Impression/Plan: Audrey Gross is clinically stable. At the present time she is requiring about 510 mg of Feraheme every 4-6 weeks. We await the results of iron studies today. More likely than not, a Feraheme infusion may be required once today's iron studies are received.  Her white count today was a little higher, but everything else is stable.  We plan to see Audrey Gross again in 3 months (01/18/2013) at which time we will check CBC, chemistries, and iron studies. Audrey Gross is encouraged to contact us in the interim if she has any questions or concerns.   Larina Bras, NP-C 10/19/2012, 1:42 PM

## 2012-10-19 NOTE — Telephone Encounter (Signed)
Gave pt appt for April 2014 lab and MD °

## 2012-10-20 ENCOUNTER — Telehealth: Payer: Self-pay | Admitting: Medical Oncology

## 2012-10-20 NOTE — Telephone Encounter (Signed)
I called pt to let her know that her ferritin has dropped to 62. Dr. Arline Asp has ordered feraheme and the schedulers will call her with date and time. Pt has received feraheme in the past. I asked her to call if any questions or concerns.

## 2012-10-21 ENCOUNTER — Telehealth: Payer: Self-pay | Admitting: *Deleted

## 2012-10-21 ENCOUNTER — Telehealth: Payer: Self-pay | Admitting: Oncology

## 2012-10-21 NOTE — Telephone Encounter (Signed)
lvm for pt regarding to 1.21.14 appt.Marland KitchenMarland KitchenMarland Kitchen

## 2012-10-21 NOTE — Telephone Encounter (Signed)
Per staff message and POF I have scheduled appts.  JMW  

## 2012-10-26 ENCOUNTER — Ambulatory Visit (HOSPITAL_BASED_OUTPATIENT_CLINIC_OR_DEPARTMENT_OTHER): Payer: BC Managed Care – PPO

## 2012-10-26 VITALS — BP 145/87 | HR 72 | Temp 97.2°F | Resp 20

## 2012-10-26 DIAGNOSIS — D509 Iron deficiency anemia, unspecified: Secondary | ICD-10-CM

## 2012-10-26 MED ORDER — SODIUM CHLORIDE 0.9 % IV SOLN
1020.0000 mg | Freq: Once | INTRAVENOUS | Status: AC
  Administered 2012-10-26: 1020 mg via INTRAVENOUS
  Filled 2012-10-26: qty 34

## 2012-10-26 MED ORDER — SODIUM CHLORIDE 0.9 % IV SOLN
Freq: Once | INTRAVENOUS | Status: AC
  Administered 2012-10-26: 10:00:00 via INTRAVENOUS

## 2012-10-26 NOTE — Patient Instructions (Signed)
Ferumoxytol injection What is this medicine? FERUMOXYTOL is an iron complex. Iron is used to make healthy red blood cells, which carry oxygen and nutrients throughout the body. This medicine is used to treat iron deficiency anemia in people with chronic kidney disease. This medicine may be used for other purposes; ask your health care provider or pharmacist if you have questions. What should I tell my health care provider before I take this medicine? They need to know if you have any of these conditions: -anemia not caused by low iron levels -high levels of iron in the blood -magnetic resonance imaging (MRI) test scheduled -an unusual or allergic reaction to iron, other medicines, foods, dyes, or preservatives -pregnant or trying to get pregnant -breast-feeding How should I use this medicine? This medicine is for infusion into a vein. It is given by a health care professional in a hospital or clinic setting. Talk to your pediatrician regarding the use of this medicine in children. Special care may be needed. Overdosage: If you think you've taken too much of this medicine contact a poison control center or emergency room at once. Overdosage: If you think you have taken too much of this medicine contact a poison control center or emergency room at once. NOTE: This medicine is only for you. Do not share this medicine with others. What if I miss a dose? It is important not to miss your dose. Call your doctor or health care professional if you are unable to keep an appointment. What may interact with this medicine? This medicine may interact with the following medications: -other iron products This list may not describe all possible interactions. Give your health care provider a list of all the medicines, herbs, non-prescription drugs, or dietary supplements you use. Also tell them if you smoke, drink alcohol, or use illegal drugs. Some items may interact with your medicine. What should I watch  for while using this medicine? Visit your doctor or healthcare professional regularly. Tell your doctor or healthcare professional if your symptoms do not start to get better or if they get worse. You may need blood work done while you are taking this medicine. You may need to follow a special diet. Talk to your doctor. Foods that contain iron include: whole grains/cereals, dried fruits, beans, or peas, leafy green vegetables, and organ meats (liver, kidney). What side effects may I notice from receiving this medicine? Side effects that you should report to your doctor or health care professional as soon as possible: -allergic reactions like skin rash, itching or hives, swelling of the face, lips, or tongue -breathing problems -changes in blood pressure -feeling faint or lightheaded, falls -fever or chills -flushing, sweating, or hot feelings -swelling of the ankles or feet Side effects that usually do not require medical attention (Report these to your doctor or health care professional if they continue or are bothersome.): -diarrhea -headache -nausea, vomiting -stomach pain This list may not describe all possible side effects. Call your doctor for medical advice about side effects. You may report side effects to FDA at 1-800-FDA-1088. Where should I keep my medicine? This drug is given in a hospital or clinic and will not be stored at home. NOTE: This sheet is a summary. It may not cover all possible information. If you have questions about this medicine, talk to your doctor, pharmacist, or health care provider.  2013, Elsevier/Gold Standard. (06/14/2008 9:48:25 PM)  

## 2013-01-18 ENCOUNTER — Ambulatory Visit (HOSPITAL_BASED_OUTPATIENT_CLINIC_OR_DEPARTMENT_OTHER): Payer: BC Managed Care – PPO | Admitting: Oncology

## 2013-01-18 ENCOUNTER — Other Ambulatory Visit (HOSPITAL_BASED_OUTPATIENT_CLINIC_OR_DEPARTMENT_OTHER): Payer: BC Managed Care – PPO | Admitting: Lab

## 2013-01-18 ENCOUNTER — Encounter: Payer: Self-pay | Admitting: Oncology

## 2013-01-18 ENCOUNTER — Telehealth: Payer: Self-pay | Admitting: Oncology

## 2013-01-18 VITALS — BP 191/96 | HR 76 | Temp 98.2°F | Resp 20 | Ht 67.0 in | Wt 183.4 lb

## 2013-01-18 DIAGNOSIS — C9110 Chronic lymphocytic leukemia of B-cell type not having achieved remission: Secondary | ICD-10-CM

## 2013-01-18 DIAGNOSIS — R030 Elevated blood-pressure reading, without diagnosis of hypertension: Secondary | ICD-10-CM

## 2013-01-18 DIAGNOSIS — D509 Iron deficiency anemia, unspecified: Secondary | ICD-10-CM

## 2013-01-18 LAB — CBC WITH DIFFERENTIAL/PLATELET
Basophils Absolute: 0.4 10*3/uL — ABNORMAL HIGH (ref 0.0–0.1)
EOS%: 0.5 % (ref 0.0–7.0)
HCT: 38.3 % (ref 34.8–46.6)
HGB: 11.9 g/dL (ref 11.6–15.9)
LYMPH%: 87.6 % — ABNORMAL HIGH (ref 14.0–49.7)
MCH: 28.1 pg (ref 25.1–34.0)
MCV: 90.6 fL (ref 79.5–101.0)
MONO%: 0.2 % (ref 0.0–14.0)
NEUT%: 10.7 % — ABNORMAL LOW (ref 38.4–76.8)
Platelets: 254 10*3/uL (ref 145–400)
lymph#: 32.9 10*3/uL — ABNORMAL HIGH (ref 0.9–3.3)

## 2013-01-18 LAB — COMPREHENSIVE METABOLIC PANEL (CC13)
ALT: 10 U/L (ref 0–55)
CO2: 23 mEq/L (ref 22–29)
Potassium: 3.4 mEq/L — ABNORMAL LOW (ref 3.5–5.1)
Sodium: 139 mEq/L (ref 136–145)
Total Bilirubin: 0.57 mg/dL (ref 0.20–1.20)
Total Protein: 7.6 g/dL (ref 6.4–8.3)

## 2013-01-18 LAB — IRON AND TIBC: TIBC: 259 ug/dL (ref 250–470)

## 2013-01-18 LAB — LACTATE DEHYDROGENASE (CC13): LDH: 222 U/L (ref 125–245)

## 2013-01-18 LAB — TECHNOLOGIST REVIEW

## 2013-01-18 NOTE — Progress Notes (Signed)
This office note has been dictated.  #[id number]

## 2013-01-18 NOTE — Telephone Encounter (Signed)
gv pt appt schedule for July.  

## 2013-01-19 NOTE — Progress Notes (Signed)
CC:   Sanda Linger, MD Rachael Fee, MD Enzo Bi, PsyD Kathreen Cosier, M.D.  PROBLEM LIST:  1. Chronic lymphocytic leukemia first diagnosed in February 2010,  stage 0. Flow cytometry was carried out on 11/20/2008. No prior history of treatment.   2. Iron-deficiency anemia which the patient states she has had for  years. When we first saw the patient 3 years ago in April 2010,  her ferritin was 3, her hemoglobin was 10.3, and she responded to  oral iron.   Iron deficiency is felt to be due to excessive menstrual bleeding. The patient had a negative colonoscopy by Dr. Wendall Papa on 04/07/2012. IV Feraheme 510 mg was given on 03/24/2012, 03/31/2012, 05/26/2012 and 06/22/2012. The patient received 1020 mg of Feraheme IV on 10/26/2012.  3. Long history of hypertension.  4. History of breast cysts. The patient had a mammogram at [facility] on 10/09/2011 with a cyst aspiration which was not sent for cytology.  5. Recent history of depression.  6. Systolic ejection murmur.  7. History of poor medical compliance. 8. Dyslipidemia.   IMMUNIZATIONS: 1. Pneumovax was given on 06/21/2012. 2. Flu shot was given in October 2013.  SMOKING HISTORY:  The patient has never smoked cigarettes.  HISTORY:  I saw Teah Votaw for followup of her chronic lymphocytic leukemia and iron-deficiency anemia.  The patient was last seen by [place] on 10/19/2012 and 07/20/2012.  The patient is without any specific complaints today.  She continues to have heavy menstrual periods without blood clots, lasting 5-7 days.  She is currently having her period.  She has her menses about every month.  She is not on any specific therapy from her gynecologist for her heavy periods.  The patient denies any pagophagia or evidence of blood in her stools.  She denies any sense of ill health or fever.  She occasionally has some sweating at night.  The patient works at [employer] in [place].  She lives  in [place].  In looking over her chart, she last had a mammogram on 10/19/2011.  I urged her to have a mammogram this year.  The patient was also notified of her elevated blood pressure today which was 191/96 and her borderline potassium of 3.4.  She is without any symptoms referable to her underlying CLL.  PHYSICAL EXAM:  General:  There is no obvious change.  Weight is 183 pounds 6.4 ounces, height 5 feet 7 inches, body surface area 1.98 sq m. Vital Signs:  Blood pressure 191/96.  Other vital signs are normal. HEENT:  There is no scleral icterus.  Mouth and pharynx are benign. There is no peripheral adenopathy palpable.  No axillary or inguinal adenopathy.  Lungs:  Clear to percussion and auscultation.  Cardiac: Regular rhythm with systolic ejection murmur.  Abdomen:  Benign with no organomegaly or masses palpable.  Abdomen:  Somewhat obese.  Breasts: Not examined.  Extremities:  No peripheral edema or clubbing. Neurologic:  Exam was normal.  LABORATORY DATA:  Today, white count 37.6 with an ANC of 4.0 and ALC of 32.9, hemoglobin 11.9, hematocrit 38.3, and platelets 254,000.  There were 11% neutrophils and 88% lymphs.  On 10/19/2012 white count was 25.0.  Chemistries today were normal, except for a potassium of 3.4. Albumin was 4.0, BUN 14, creatinine 0.7.  LDH today is pending, as are the iron studies.  LDH on 10/19/2012 was 227 and ferritin was 62,.  IMAGING STUDIES: The only imaging study that we have  available is  mammogram from 10/09/2011 which reads as follows: Breast tissue is  heterogeneously dense. There is a palpable area in the superior left  breast that was marked with a metallic BB. Multiple bilateral benign-  looking obscured masses were seen. There was scattered benign-looking  calcifications seen bilaterally more on the right. No suspicious  calcifications or architectural distortion. On ultrasound, the  concerning area which is located at the 12 o'clock position  in the left  breast. There were 2 adjacent simple cysts seen measuring 3.5 cm in  maximum diameter. Multiple cysts were seen in both breasts. There were  no suspicions looking masses, abnormal shadowing, or other features  suggestive for malignancy and a yearly mammogram was recommended.  The patient underwent a cyst aspiration on 10/09/2011; this was not sent for cytology.   PROCEDURES:  The patient had a normal colonoscopy carried out by Dr. Wendall Papa on 04/07/2012.  IMPRESSION AND PLAN:  The patient is clinically stable.  I should mention that following her last visit here on 10/19/2012 the patient's ferritin had decreased from 293 on 07/20/2012 to 62.  Because of that, we ended up giving the patient IV Feraheme 1020 mg on 10/26/2012.  The patient tolerated that well.  The patient was informed about her rising white count, elevated blood pressure, and slightly low potassium today.  The patient was urged to have a mammogram.  She does have a blood pressure cuff at home and I suggested she may want to take her blood pressure at home.  All of her blood pressures here have been significantly elevated.  In fact, the lowest blood pressure that we have gotten on her was on 08/14/2009 when it was 154/90.  If the ferritin again has dropped significantly, we will plan to give the patient additional IV Feraheme.  We will plan to see [place] again in 3 months, at which time we will check CBC, chemistries, and iron studies.    ______________________________ Samul Dada, M.D. DSM/MEDQ  D:  01/18/2013  T:  01/19/2013  Job:  [id number]

## 2013-03-03 ENCOUNTER — Other Ambulatory Visit: Payer: Self-pay | Admitting: Oncology

## 2013-03-18 ENCOUNTER — Telehealth: Payer: Self-pay | Admitting: Oncology

## 2013-03-18 NOTE — Telephone Encounter (Signed)
Moved 7/14 appt to covering provider due to DM's departure. lmonvm for pt on both home/cell re new time for lb/fu 7/14 @ 9am.

## 2013-04-18 ENCOUNTER — Telehealth: Payer: Self-pay | Admitting: Hematology and Oncology

## 2013-04-18 ENCOUNTER — Ambulatory Visit (HOSPITAL_BASED_OUTPATIENT_CLINIC_OR_DEPARTMENT_OTHER): Payer: BC Managed Care – PPO | Admitting: Hematology and Oncology

## 2013-04-18 ENCOUNTER — Other Ambulatory Visit (HOSPITAL_BASED_OUTPATIENT_CLINIC_OR_DEPARTMENT_OTHER): Payer: BC Managed Care – PPO | Admitting: Lab

## 2013-04-18 VITALS — BP 181/99 | HR 81 | Temp 98.6°F | Resp 18 | Ht 67.0 in | Wt 187.6 lb

## 2013-04-18 DIAGNOSIS — C9110 Chronic lymphocytic leukemia of B-cell type not having achieved remission: Secondary | ICD-10-CM

## 2013-04-18 DIAGNOSIS — D509 Iron deficiency anemia, unspecified: Secondary | ICD-10-CM

## 2013-04-18 LAB — COMPREHENSIVE METABOLIC PANEL (CC13)
AST: 17 U/L (ref 5–34)
Albumin: 4.3 g/dL (ref 3.5–5.0)
Alkaline Phosphatase: 52 U/L (ref 40–150)
BUN: 7.2 mg/dL (ref 7.0–26.0)
Calcium: 9.5 mg/dL (ref 8.4–10.4)
Creatinine: 0.8 mg/dL (ref 0.6–1.1)
Glucose: 105 mg/dl (ref 70–140)

## 2013-04-18 LAB — CBC WITH DIFFERENTIAL/PLATELET
BASO%: 0 % (ref 0.0–2.0)
Basophils Absolute: 0 10*3/uL (ref 0.0–0.1)
EOS%: 0.2 % (ref 0.0–7.0)
HCT: 38.9 % (ref 34.8–46.6)
HGB: 12.7 g/dL (ref 11.6–15.9)
LYMPH%: 89.5 % — ABNORMAL HIGH (ref 14.0–49.7)
MCH: 28.7 pg (ref 25.1–34.0)
MCHC: 32.7 g/dL (ref 31.5–36.0)
MCV: 87.7 fL (ref 79.5–101.0)
NEUT%: 9.9 % — ABNORMAL LOW (ref 38.4–76.8)
Platelets: 274 10*3/uL (ref 145–400)

## 2013-04-18 LAB — IRON AND TIBC CHCC: TIBC: 264 ug/dL (ref 236–444)

## 2013-04-18 LAB — TECHNOLOGIST REVIEW

## 2013-04-18 NOTE — Progress Notes (Signed)
CC:   Sanda Linger, MD Rachael Fee, MD Enzo Bi, PsyD Kathreen Cosier, M.D.  PROBLEM LIST:  1. Chronic lymphocytic leukemia first diagnosed in February 2010,  stage 0. Flow cytometry was carried out on 11/20/2008. No prior history of treatment.   2. Iron-deficiency anemia which the patient states she has had for  years. When we first saw the patient 3 years ago in April 2010,  her ferritin was 3, her hemoglobin was 10.3, and she responded to  oral iron.   Iron deficiency is felt to be due to excessive menstrual bleeding. The patient had a negative colonoscopy by Dr. Wendall Papa on 04/07/2012. IV Feraheme 510 mg was given on 03/24/2012, 03/31/2012, 05/26/2012 and 06/22/2012. The patient received 1020 mg of Feraheme IV on 10/26/2012.  3. Long history of hypertension.  4. History of breast cysts. The patient had a mammogram at [facility] on 10/09/2011 with a cyst aspiration which was not sent for cytology.  5. Recent history of depression.  6. Systolic ejection murmur.  7. History of poor medical compliance. 8. Dyslipidemia.   IMMUNIZATIONS: 1. Pneumovax was given on 06/21/2012. 2. Flu shot was given in October 2013.  SMOKING HISTORY:  The patient has never smoked cigarettes.  HISTORY:  I saw Audrey Gross for followup of her chronic lymphocytic leukemia and iron-deficiency anemia. The patient is without any specific complaints today. Shehas her menses about every month. The patient denies any pagophagia or evidence of blood in her stools.  She denies any sense of ill health or fever.  She occasionally has some sweating at night.  The patient works at [employer] in [place].  She lives in [place].  In looking over her chart, she last had a mammogram on 10/19/2011.  I urged her to have a mammogram this year.  The patient was also notified of her elevated blood pressure today which was 173/91 and her borderline potassium of 3.4.  She is without any  symptoms referable to her underlying CLL.  Blood pressure 181/99, pulse 81, temperature 98.6 F (37 C), temperature source Oral, resp. rate 18, height 5\' 7"  (1.702 m), weight 187 lb 9.6 oz (85.095 kg).  PHYSICAL EXAM:  General:  There is no obvious change.  HEENT:  There is no scleral icterus.  Mouth and pharynx are benign. There is no peripheral adenopathy palpable.  No axillary or inguinal adenopathy.  Lungs:  Clear to percussion and auscultation.  Cardiac: Regular rhythm with systolic ejection murmur.  Abdomen:  Benign with no organomegaly or masses palpable.  Abdomen:  Somewhat obese.  Breasts: Not examined.  Extremities:  No peripheral edema or clubbing. Neurologic:  Exam was normal.  LABORATORY DATA:  Today, white count 37.6 with an ANC of 4.0 and ALC of 32.9, hemoglobin 11.9, hematocrit 38.3, and platelets 254,000.  There were 11% neutrophils and 88% lymphs.  On 10/19/2012 white count was 25.0.  Chemistries today were normal, except for a potassium of 3.4. Albumin was 4.0, BUN 14, creatinine 0.7.  LDH today is pending, as are the iron studies.  LDH on 10/19/2012 was 227 and ferritin was 62,.  IMAGING STUDIES: The only imaging study that we have available is  mammogram from 10/09/2011 which reads as follows: Breast tissue is  heterogeneously dense. There is a palpable area in the superior left  breast that was marked with a metallic BB. Multiple bilateral benign-  looking obscured masses were seen. There was scattered benign-looking  calcifications seen bilaterally more  on the right. No suspicious  calcifications or architectural distortion. On ultrasound, the  concerning area which is located at the 12 o'clock position in the left  breast. There were 2 adjacent simple cysts seen measuring 3.5 cm in  maximum diameter. Multiple cysts were seen in both breasts. There were  no suspicions looking masses, abnormal shadowing, or other features  suggestive for malignancy and a yearly  mammogram was recommended.  The patient underwent a cyst aspiration on 10/09/2011; this was not sent for cytology.   CBC    Component Value Date/Time   WBC 49.1* 04/18/2013 0902   RBC 4.43 04/18/2013 0902   HGB 12.7 04/18/2013 0902   HGB 12.2 03/25/2012 1833   HCT 38.9 04/18/2013 0902   HCT 36.0 03/25/2012 1833   PLT 274 04/18/2013 0902   MCV 87.7 04/18/2013 0902   MCH 28.7 04/18/2013 0902   MCH 29.2 10/15/2009 1507   MCHC 32.7 04/18/2013 0902   RDW 13.8 04/18/2013 0902   LYMPHSABS 43.9* 04/18/2013 0902   MONOABS 0.2 04/18/2013 0902   EOSABS 0.1 04/18/2013 0902   BASOSABS 0.0 04/18/2013 0902   Lab Results  Component Value Date   GLUCOSE 105 04/18/2013   BUN 7.2 04/18/2013   CO2 24 04/18/2013   ALT 13 04/18/2013   AST 17 04/18/2013   LDH 297* 04/18/2013   K 3.3* 04/18/2013   CREATININE 0.8 04/18/2013     PROCEDURES:  The patient had a normal colonoscopy carried out by Dr. Wendall Papa on 04/07/2012.  IMPRESSION AND PLAN:  The patient is clinically stable.  Following her last visit here on 10/19/2012 the patient's ferritin had decreased from 293 on 07/20/2012 to 62.  Because of that, we ended up giving the patient IV Feraheme 1020 mg on 10/26/2012.  The patient tolerated that well.  The patient was informed about her rising white count, elevated blood pressure, and we stressed the importance of better BP control.  She does have a blood pressure cuff at home and I suggested she may want to take her blood pressure at home.   We informed her with the count today, and although it is continue to rise, she remains asymptomatic  and the rest of her count is ok; therefore, we will continue observation. Patient will rerun to our clinic in 3 months.studies.  Zachery Dakins, MD 04/18/2013 10:20 AM

## 2013-04-18 NOTE — Progress Notes (Signed)
I discussed BP with pt. Encouraged her to check her BP and to call her primary care MD. She states it is always high when she come here.

## 2013-04-18 NOTE — Telephone Encounter (Signed)
gv and printed appt sched and avs forl pt. °

## 2013-07-24 ENCOUNTER — Other Ambulatory Visit: Payer: Self-pay | Admitting: Internal Medicine

## 2013-07-24 DIAGNOSIS — C9110 Chronic lymphocytic leukemia of B-cell type not having achieved remission: Secondary | ICD-10-CM

## 2013-07-24 DIAGNOSIS — D509 Iron deficiency anemia, unspecified: Secondary | ICD-10-CM

## 2013-07-25 ENCOUNTER — Telehealth: Payer: Self-pay | Admitting: Internal Medicine

## 2013-07-25 ENCOUNTER — Encounter: Payer: Self-pay | Admitting: Internal Medicine

## 2013-07-25 ENCOUNTER — Other Ambulatory Visit (HOSPITAL_BASED_OUTPATIENT_CLINIC_OR_DEPARTMENT_OTHER): Payer: BC Managed Care – PPO

## 2013-07-25 ENCOUNTER — Telehealth: Payer: Self-pay | Admitting: *Deleted

## 2013-07-25 ENCOUNTER — Ambulatory Visit (HOSPITAL_BASED_OUTPATIENT_CLINIC_OR_DEPARTMENT_OTHER): Payer: BC Managed Care – PPO | Admitting: Internal Medicine

## 2013-07-25 VITALS — BP 190/106 | HR 71 | Temp 97.1°F | Resp 20 | Ht 67.0 in | Wt 186.2 lb

## 2013-07-25 DIAGNOSIS — N920 Excessive and frequent menstruation with regular cycle: Secondary | ICD-10-CM

## 2013-07-25 DIAGNOSIS — C9110 Chronic lymphocytic leukemia of B-cell type not having achieved remission: Secondary | ICD-10-CM

## 2013-07-25 DIAGNOSIS — I10 Essential (primary) hypertension: Secondary | ICD-10-CM

## 2013-07-25 DIAGNOSIS — D509 Iron deficiency anemia, unspecified: Secondary | ICD-10-CM

## 2013-07-25 DIAGNOSIS — D500 Iron deficiency anemia secondary to blood loss (chronic): Secondary | ICD-10-CM

## 2013-07-25 LAB — COMPREHENSIVE METABOLIC PANEL (CC13)
ALT: 8 U/L (ref 0–55)
Albumin: 4 g/dL (ref 3.5–5.0)
Anion Gap: 9 mEq/L (ref 3–11)
CO2: 22 mEq/L (ref 22–29)
Calcium: 9.4 mg/dL (ref 8.4–10.4)
Chloride: 110 mEq/L — ABNORMAL HIGH (ref 98–109)
Creatinine: 0.8 mg/dL (ref 0.6–1.1)
Potassium: 4.2 mEq/L (ref 3.5–5.1)
Sodium: 141 mEq/L (ref 136–145)
Total Protein: 7.5 g/dL (ref 6.4–8.3)

## 2013-07-25 LAB — CBC WITH DIFFERENTIAL/PLATELET
BASO%: 0.7 % (ref 0.0–2.0)
HCT: 34.8 % (ref 34.8–46.6)
HGB: 11.2 g/dL — ABNORMAL LOW (ref 11.6–15.9)
MCHC: 32.1 g/dL (ref 31.5–36.0)
MONO#: 0.1 10*3/uL (ref 0.1–0.9)
NEUT%: 12.8 % — ABNORMAL LOW (ref 38.4–76.8)
WBC: 37.5 10*3/uL — ABNORMAL HIGH (ref 3.9–10.3)
lymph#: 32.1 10*3/uL — ABNORMAL HIGH (ref 0.9–3.3)

## 2013-07-25 LAB — IRON AND TIBC CHCC
%SAT: 10 % — ABNORMAL LOW (ref 21–57)
Iron: 30 ug/dL — ABNORMAL LOW (ref 41–142)
TIBC: 291 ug/dL (ref 236–444)

## 2013-07-25 NOTE — Telephone Encounter (Signed)
lvm for pt regarding to 10.23.14 and Jan appt.Marland KitchenMarland Kitchen

## 2013-07-25 NOTE — Telephone Encounter (Signed)
gv and printed appt sched and avs for pt for Jan 2015 °

## 2013-07-25 NOTE — Telephone Encounter (Signed)
Per staff message and POF I have scheduled appts.  JMW  

## 2013-07-25 NOTE — Patient Instructions (Signed)
Chronic Lymphocytic Leukemia Chronic lymphocytic leukemia (CLL) is a type of cancer of the bone marrow and blood cells. Bone marrow is the soft, spongy tissue inside your bone. In CLL, the bone marrow makes too many white blood cells that usually fight infection in the body (lymphocytes). CLL is the most common type of adult leukemia.  CAUSES  No one knows the exact cause of CLL. There is a higher risk of CLL in people who:   Are over 52 years of age.  Are white.  Are female.  Have a family history of CLL or other cancers of the lymph system.  Are of [place] or [place] European Jewish descent.  Have been exposed to certain chemicals, such as Agent Orange (used in the [place] War) or other herbicides or insecticides. SYMPTOMS  At first, some people do not have symptoms of chronic lymphocytic leukemia. After a while, people may notice some symptoms, such as:   Feeling more tired than usual, even after rest.  Unplanned weight loss.  Heavy sweating at night.  Fevers.  Shortness of breath.  Decreased energy.  Paleness.  Painless, swollen lymph nodes.  A feeling of fullness in the upper left part of the abdomen.  Easy bruising and/or bleeding.  More frequent infections. DIAGNOSIS   During a physical exam, your caregiver may notice an enlarged spleen, liver and/or lymph nodes.  Blood and bone marrow tests are performed to identify the presence of cancer cells.  A CT scan may be done to look for swelling or abnormalities in your spleen, liver, and lymph nodes. TREATMENT  Treatment options for CLL depend on the stage and the presence of symptoms. There are a number of types of treatment used for this condition, including:  Targeted drugs. These are drugs that interfere with chemicals that leukemia cells need in order to grow and multiply.  Chemotherapy drugs. These medications kill cells that are multiplying quickly, such as leukemia cells.  Biological therapy. This  treatment boosts the ability of the patient's own immune system to fight the leukemia cells.  Bone marrow or peripheral blood stem cell transplant. This treatment allows the patient to receive very high doses of chemotherapy and/or radiation. These high doses kill the cancer cells, but also destroy the bone marrow. After treatment is complete, the patient is given donor bone marrow or stem cells, which will replace the bone marrow. HOME CARE INSTRUCTIONS   Because you have an increased risk of infection, practice good hand washing and avoid being around people who are ill or crowded places.  Because you have an increased risk of bleeding and bruising, avoid contact sports or other rough activities.  Only take over-the-counter or prescription medicines for pain, discomfort or fever as directed by your caregiver.  Although some of your treatments might affect your appetite, try to eat regular, healthy meals.  If you develop any side effects, such as nausea, diarrhea, rash, white patches in your mouth, a sore throat, difficulty swallowing, or severe fatigue, tell your caregiver. He or she may have recommendations of things you can do to improve symptoms.  Consider learning some ways to cope with the stress of having a chronic illness, such as yoga, meditation, or participating in a support group. SEEK IMMEDIATE MEDICAL CARE IF:  You develop an unexplained oral temperature of 102 F (38.9 C) or more.  You develop chest pains.  You develop a severe stiff neck or headache.  You have trouble breathing or feel short of breath.  You feel very lightheaded or pass out.  You notice pain, swelling or redness anywhere in your legs.  You have pain in your belly (abdomen).  You develop new bruises that are getting bigger.  You have painful or more swollen lymph nodes.  You develop bleeding from your gums, nose, or in your urine or stools.  You are unable to stop throwing up (vomiting).  You  cannot keep liquids down.  You feel depressed. Document Released: 02/08/2009 Document Revised: 12/15/2011 Document Reviewed: 02/08/2009 [facility] Patient Information 2014 [place], [place]. Iron Deficiency Anemia There are many types of anemia. Iron deficiency anemia is the most common. Iron deficiency anemia is a decrease in the number of red blood cells caused by too little iron. Without enough iron, your body does not produce enough hemoglobin. Hemoglobin is a substance in red blood cells that carries oxygen to the body's tissues. Iron deficiency anemia may leave you tired and short of breath. CAUSES   Lack of iron in the diet.  This may be seen in infants and children, because there is little iron in milk.  This may be seen in adults who do not eat enough iron-rich foods.  This may be seen in pregnant or breastfeeding women who do not take iron supplements. There is a much higher need for iron intake at these times.  Poor absorption of iron, as seen with intestinal disorders.  Intestinal bleeding.  Heavy periods. SYMPTOMS  Mild anemia may not be noticeable. Symptoms may include:  Fatigue.  Headache.  Pale skin.  Weakness.  Shortness of breath.  Dizziness.  Cold hands and feet.  Fast or irregular heartbeat. DIAGNOSIS  Diagnosis requires a thorough evaluation and physical exam by your caregiver.  Blood tests are generally used to confirm iron deficiency anemia.  Additional tests may be done to find the underlying cause of your anemia. These may include:  Testing for blood in the stool (fecal occult blood test).  A procedure to see inside the colon and rectum (colonoscopy).  A procedure to see inside the esophagus and stomach (endoscopy). TREATMENT   Correcting the cause of the iron deficiency is the first step.  Medicines, such as oral contraceptives, can make heavy menstrual flows lighter.  Antibiotics and other medicines can be used to treat peptic  ulcers.  Surgery may be needed to remove a bleeding polyp, tumor, or fibroid.  Often, iron supplements (ferrous sulfate) are taken.  For the best iron absorption, take these supplements with an empty stomach.  You may need to take the supplements with food if you cannot tolerate them on an empty stomach. Vitamin C improves the absorption of iron. Your caregiver may recommend taking your iron tablets with a glass of orange juice or vitamin C supplement.  Milk and antacids should not be taken at the same time as iron supplements. They may interfere with the absorption of iron.  Iron supplements can cause constipation. A stool softener is often recommended.  Pregnant and breastfeeding women will need to take extra iron, because their normal diet usually will not provide the required amount.  Patients who cannot tolerate iron by mouth can take it through a vein (intravenously) or by an injection into the muscle. HOME CARE INSTRUCTIONS   Ask your dietitian for help with diet questions.  Take iron and vitamins as directed by your caregiver.  Eat a diet rich in iron. Eat liver, lean beef, whole-grain bread, eggs, dried fruit, and dark green leafy vegetables. SEEK IMMEDIATE MEDICAL CARE  IF:   You have a fainting episode. Do not drive yourself. Call your local emergency services (911 in [place] if no other help is available.  You have chest pain, nausea, or vomiting.  You develop severe or increased shortness of breath with activities.  You develop weakness or increased thirst.  You have a rapid heartbeat.  You develop unexplained sweating or become lightheaded when getting up from a chair or bed. MAKE SURE YOU:   Understand these instructions.  Will watch your condition.  Will get help right away if you are not doing well or get worse. Document Released: 09/19/2000 Document Revised: 12/15/2011 Document Reviewed: 01/29/2010 [facility] Patient Information 2014 [place], [place].

## 2013-07-25 NOTE — Progress Notes (Signed)
[facility] OFFICE PROGRESS NOTE  Sanda Linger, MD [address] [facility] [address] [place], [address] [place] [address]  DIAGNOSIS: CLL (chronic lymphocytic leukemia) - Plan: CBC with Differential, Comprehensive metabolic panel  Iron deficiency anemia - Plan: Ferritin, Iron and TIBC  Chief Complaint  Patient presents with  . CLL (chronic lymphocytic leukemia)    CURRENT THERAPY: 1. CLL on observation. 2. Iron deficiency anemia due to menstrual bleeding on IV Feraheme when necessary.  INTERVAL HISTORY: Audrey Gross 52 y.o. female iron deficiency anemia, CLL and cystic breast is here for followup. She was last seen by Dr. Arline Asp on 01/18/2013. Patient reports that she feels fine overall. She denies any emergency room visits or hospitalizations since her last followup appointment. She follows up with her primary care physician Dr. Sanda Linger regularly. She reports her last mammogram was done January 2013. She also endorses increased anxiety when doing mammograms. Denies any infections any fevers or chills, or an acute shortness of breath, any weight changes. She is up-to-date for colonoscopy which was done by Dr. Christella Hartigan on 04/07/2012. She she reports a last menstrual cycle was last week. She described it as low as normal lasting 5-7 days, one to 2 packs per day. He denies any [place] or dyspnea on exertion or fatigue or chest pain or palpitations.  MEDICAL HISTORY: Past Medical History  Diagnosis Date  . Hypertension   . CLL (chronic lymphoblastic leukemia)   . Depression   . Iron deficiency anemia, unspecified   . Diffuse cystic mastopathy   . Cystic breast   . Systolic ejection murmur   . 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    ALLERGIES:  has No Known Allergies.  MEDICATIONS: has a current medication list which includes the following prescription(s): carvedilol, lisinopril, and rosuvastatin.  SURGICAL HISTORY:  Past Surgical History  Procedure Laterality Date  . Tubal ligation      REVIEW OF SYSTEMS:   Constitutional: Denies fevers, chills or abnormal weight loss Eyes: Denies blurriness of vision Ears, nose, mouth, throat, and face: Denies mucositis or sore throat Respiratory: Denies cough, dyspnea or wheezes Cardiovascular: Denies palpitation, chest discomfort or lower extremity swelling Gastrointestinal:  Denies nausea, heartburn or change in bowel habits Skin: Denies abnormal skin rashes Lymphatics: Denies new lymphadenopathy or easy bruising Neurological:Denies numbness, tingling or new weaknesses Behavioral/Psych: Mood is stable, no new changes  All other systems were reviewed with the patient and are negative.  PHYSICAL EXAMINATION: ECOG PERFORMANCE STATUS: 0 - Asymptomatic  Blood pressure 190/106, pulse 71, temperature 97.1 F (36.2 C), temperature source Oral, resp. rate 20, height 5\' 7"  (1.702 m), weight 186 lb 3.2 oz (84.46 kg).  GENERAL:alert, no distress and comfortable and anxious well-developed well-nourished female, mildly obese. SKIN: skin color, texture, turgor are normal, no rashes or significant lesions EYES: normal, Conjunctiva are pink and non-injected, sclera clear OROPHARYNX:no exudate, no erythema and lips, buccal mucosa, and tongue normal  NECK: supple, thyroid normal size, non-tender, without nodularity LYMPH:  no palpable lymphadenopathy in the cervical, axillary or supraclavicular LUNGS: clear to auscultation and percussion with normal breathing effort HEART: regular rate & rhythm and mild systolic ejection murmur  and no lower extremity edema  ABDOMEN:abdomen soft,  non-tender and normal bowel sounds Musculoskeletal:no cyanosis of digits and no clubbing  NEURO: alert & oriented x 3 with fluent speech, no focal motor/sensory deficits   LABORATORY DATA: Results for orders placed in visit on 07/25/13 (from the past 48 hour(s))  CBC WITH DIFFERENTIAL     Status: Abnormal   Collection Time    07/25/13  9:53 AM      Result Value Range   WBC 37.5 (*) 3.9 - 10.3 10e3/uL   NEUT# 4.8  1.5 - 6.5 10e3/uL   HGB 11.2 (*) 11.6 - 15.9 g/dL   HCT 16.1  09.6 - 04.5 %   Platelets 300  145 - 400 10e3/uL   MCV 85.5  79.5 - 101.0 fL   MCH 27.5  25.1 - 34.0 pg   MCHC 32.1  31.5 - 36.0 g/dL   RBC 4.09  8.11 - 9.14 10e6/uL   RDW 15.5 (*) 11.2 - 14.5 %   lymph# 32.1 (*) 0.9 - 3.3 10e3/uL   MONO# 0.1  0.1 - 0.9 10e3/uL   Eosinophils Absolute 0.1  0.0 - 0.5 10e3/uL   Basophils Absolute 0.3 (*) 0.0 - 0.1 10e3/uL   NEUT% 12.8 (*) 38.4 - 76.8 %   LYMPH% 85.9 (*) 14.0 - 49.7 %   MONO% 0.3  0.0 - 14.0 %   EOS% 0.3  0.0 - 7.0 %   BASO% 0.7  0.0 - 2.0 %  IRON AND TIBC [facility]     Status: Abnormal   Collection Time    07/25/13  9:53 AM      Result Value Range   Iron 30 (*) 41 - 142 ug/dL   TIBC 782  956 - 213 ug/dL   UIBC 086  578 - 469 ug/dL   %SAT 10 (*) 21 - 57 %  FERRITIN [facility]     Status: None   Collection Time    07/25/13  9:53 AM      Result Value Range   Ferritin 26  9 - 269 ng/ml  TECHNOLOGIST REVIEW     Status: None   Collection Time    07/25/13  9:53 AM      Result Value Range   Technologist Review Variant lymphs present    COMPREHENSIVE METABOLIC PANEL ([facility]13)     Status: Abnormal   Collection Time    07/25/13  9:53 AM      Result Value Range   Sodium 141  136 - 145 mEq/L   Potassium 4.2  3.5 - 5.1 mEq/L   Chloride 110 (*) 98 - 109 mEq/L   CO2 22  22 - 29 mEq/L   Glucose 92  70 - 140 mg/dl   BUN 62.9  7.0 - 52.8 mg/dL   Creatinine 0.8  0.6 - 1.1 mg/dL   Total Bilirubin 4.13  0.20 - 1.20 mg/dL   Alkaline Phosphatase 50  40 - 150 U/L   AST 12  5 -  34 U/L   ALT 8  0 - 55 U/L   Total Protein 7.5  6.4 - 8.3 g/dL   Albumin 4.0  3.5 - 5.0 g/dL   Calcium 9.4  8.4 - 24.4 mg/dL   Anion Gap 9  3 - 11 mEq/L    Labs:  Lab Results  Component Value Date   WBC 37.5* 07/25/2013   HGB 11.2* 07/25/2013   HCT 34.8 07/25/2013   MCV 85.5 07/25/2013   PLT 300 07/25/2013   NEUTROABS 4.8 07/25/2013  Chemistry      Component Value Date/Time   NA 141 07/25/2013 0953   NA 139 05/18/2012 0933   K 4.2 07/25/2013 0953   K 4.0 05/18/2012 0933   CL 105 01/18/2013 0912   CL 104 05/18/2012 0933   CO2 22 07/25/2013 0953   CO2 28 05/18/2012 0933   BUN 11.4 07/25/2013 0953   BUN 9 05/18/2012 0933   CREATININE 0.8 07/25/2013 0953   CREATININE 0.90 05/18/2012 0933      Component Value Date/Time   CALCIUM 9.4 07/25/2013 0953   CALCIUM 9.3 05/18/2012 0933   ALKPHOS 50 07/25/2013 0953   ALKPHOS 39 05/18/2012 0933   AST 12 07/25/2013 0953   AST 14 05/18/2012 0933   ALT 8 07/25/2013 0953   ALT 9 05/18/2012 0933   BILITOT 0.66 07/25/2013 0953   BILITOT 0.7 05/18/2012 0933     Basic Metabolic Panel:  Recent Labs Lab 07/25/13 0953  NA 141  K 4.2  CO2 22  GLUCOSE 92  BUN 11.4  CREATININE 0.8  CALCIUM 9.4   GFR Estimated Creatinine Clearance: 91.9 ml/min (by C-G formula based on Cr of 0.8). Liver Function Tests:  Recent Labs Lab 07/25/13 0953  AST 12  ALT 8  ALKPHOS 50  BILITOT 0.66  PROT 7.5  ALBUMIN 4.0   No results found for this basename: LIPASE, AMYLASE,  in the last 168 hours No results found for this basename: AMMONIA,  in the last 168 hours Coagulation profile No results found for this basename: INR, PROTIME,  in the last 168 hours  CBC:  Recent Labs Lab 07/25/13 0953  WBC 37.5*  NEUTROABS 4.8  HGB 11.2*  HCT 34.8  MCV 85.5  PLT 300    Recent Results (from the past 240 hour(s))  TECHNOLOGIST REVIEW     Status: None   Collection Time    07/25/13  9:53 AM      Result Value Range Status   Technologist Review  Variant lymphs present   Final   Studies:  No results found.   RADIOGRAPHIC STUDIES: No results found.  ASSESSMENT: Audrey Gross 52 y.o. female with a history of CLL (chronic lymphocytic leukemia) - Plan: CBC with Differential, Comprehensive metabolic panel  Iron deficiency anemia - Plan: Ferritin, Iron and TIBC  PLAN:  1. CLL. --The patient was informed about her rising white count 37.5, slightly down from 49.1 on 04/18/2013. She denies any new symptoms including painful lymphadenopathy, night sweats, fevers or chills. Her anemia as detailed #2 is thought to be due to her menses and not related to her CLL. He viewed extensively the diagnosis of CLL, symptoms of CLL and treatment options. She was also provided a handout detailing CLL.  2. Iron deficiency anemia due to menses. --Her iron deficiency anemia indices were reviewed and revealed a ferritin of 36 down from 51 over  2 months ago.  Her iron level was 30 which is low. Should her hemoglobin was 11.2 down from 12.7 nearly 2 months ago. She also has increased RDW of 15.5. This is consistent with iron deficiency anemia. We plan to give her 510 mg of IV feraheme on Wednesday eat 07/27/2013. We'll recheck her iron indices on her followup appointment with [place] in 3 months. He was also provided a handout on iron deficiency anemia. She understood the risks of IV feraheme includes it is not limited to severe hypersensitivity reactions or hypotension and more commonly nausea and/or diarrhea. He understood the above and  agreed to proceed.  3. History of breast cysts. --She had a mammogram at [facility] on 10/09/2011 with a cyst aspiration which was not sent for cytology. Reports increased anxiety over having an additional mammogram so therefore she has not gone. We advised her strongly to followup with repeat mammogram this year.  4. Hypertension. --Patient's blood pressure was markedly elevated today at 190/106. Her last visit in July 2014  also demonstrated elevated blood pressures of 181/99. She reports that her blood pressures at home or usually 140s over 70s. She reports increased anxiety when visiting the doctor's office. She reports compliance to her medications including her antihypertensives.   5. Followup. --Patient was instructed to followup with her primary care physician's office for repeat blood pressure measurements. She likely will require those modifications for her antihypertensive therapy. Issues she should followup in 3 months with CBC and chemistries on the day of her next visit. She was provided after visit summary.  All questions were answered. The patient knows to call the clinic with any problems, questions or concerns. We can certainly see the patient much sooner if necessary.  I spent 15 minutes counseling the patient face to face. The total time spent in the appointment was 25 minutes.    Zarra Geffert, MD 07/25/2013 3:47 PM

## 2013-07-26 ENCOUNTER — Other Ambulatory Visit (INDEPENDENT_AMBULATORY_CARE_PROVIDER_SITE_OTHER): Payer: BC Managed Care – PPO

## 2013-07-26 ENCOUNTER — Ambulatory Visit (INDEPENDENT_AMBULATORY_CARE_PROVIDER_SITE_OTHER): Payer: BC Managed Care – PPO | Admitting: Internal Medicine

## 2013-07-26 ENCOUNTER — Encounter: Payer: Self-pay | Admitting: Internal Medicine

## 2013-07-26 VITALS — BP 180/110 | HR 64 | Temp 98.5°F | Resp 16 | Ht 67.0 in | Wt 187.0 lb

## 2013-07-26 DIAGNOSIS — E785 Hyperlipidemia, unspecified: Secondary | ICD-10-CM

## 2013-07-26 DIAGNOSIS — R0609 Other forms of dyspnea: Secondary | ICD-10-CM | POA: Insufficient documentation

## 2013-07-26 DIAGNOSIS — I10 Essential (primary) hypertension: Secondary | ICD-10-CM

## 2013-07-26 DIAGNOSIS — R011 Cardiac murmur, unspecified: Secondary | ICD-10-CM

## 2013-07-26 LAB — BRAIN NATRIURETIC PEPTIDE: Pro B Natriuretic peptide (BNP): 87 pg/mL (ref 0.0–100.0)

## 2013-07-26 LAB — LIPID PANEL
Cholesterol: 213 mg/dL — ABNORMAL HIGH (ref 0–200)
HDL: 52.9 mg/dL (ref >39.00)
Total CHOL/HDL Ratio: 4

## 2013-07-26 LAB — COMPREHENSIVE METABOLIC PANEL
ALT: 13 U/L (ref 0–35)
AST: 14 U/L (ref 0–37)
Albumin: 4.4 g/dL (ref 3.5–5.2)
BUN: 16 mg/dL (ref 6–23)
CO2: 24 mEq/L (ref 19–32)
Calcium: 9.1 mg/dL (ref 8.4–10.5)
Chloride: 107 mEq/L (ref 96–112)
GFR: 99.47 mL/min (ref >60.00)
Glucose, Bld: 90 mg/dL (ref 70–99)
Potassium: 3.9 mEq/L (ref 3.5–5.1)

## 2013-07-26 LAB — URINALYSIS, ROUTINE W REFLEX MICROSCOPIC
Hgb urine dipstick: NEGATIVE
Nitrite: NEGATIVE
RBC / HPF: NONE SEEN (ref 0–?)
Total Protein, Urine: NEGATIVE
Urine Glucose: NEGATIVE
pH: 6.5 (ref 5.0–8.0)

## 2013-07-26 LAB — LDL CHOLESTEROL, DIRECT: Direct LDL: 144.5 mg/dL

## 2013-07-26 LAB — D-DIMER, QUANTITATIVE (NOT AT ARMC): D-Dimer, Quant: 0.27 ug/mL-FEU (ref 0.00–0.48)

## 2013-07-26 MED ORDER — OLMESARTAN-AMLODIPINE-HCTZ 40-5-12.5 MG PO TABS: 1.0000 | ORAL_TABLET | Freq: Every day | ORAL | Status: DC

## 2013-07-26 MED ORDER — ROSUVASTATIN CALCIUM 10 MG PO TABS: 10.0000 mg | ORAL_TABLET | Freq: Every day | ORAL | Status: DC

## 2013-07-26 NOTE — Assessment & Plan Note (Signed)
Her EKG shows LVH and TWI in II, II, and V6 - this is unchanged from her prior EKG in June 2013 I will check her labs today to look for CHF, PE, and other organic causes of DOE Will also get better control of her BP

## 2013-07-26 NOTE — Assessment & Plan Note (Signed)
She will restart crestor

## 2013-07-26 NOTE — Assessment & Plan Note (Signed)
Her BP is not well controlled so I have upgraded her to tribenzor Today I will check her lytes and renal function

## 2013-07-26 NOTE — Progress Notes (Signed)
  Subjective:    Patient ID: Audrey Gross, female    DOB: 09-Dec-1960, 52 y.o.   MRN: [id number]  Hypertension This is a chronic problem. The current episode started more than 1 year ago. The problem has been gradually worsening since onset. The problem is uncontrolled. Associated symptoms include malaise/fatigue and shortness of breath. Pertinent negatives include no anxiety, blurred vision, chest pain, headaches, neck pain, orthopnea, palpitations, peripheral edema, PND or sweats. Past treatments include angiotensin blockers, beta blockers and diuretics.      Review of Systems  Constitutional: Positive for malaise/fatigue and fatigue. Negative for fever, chills, diaphoresis, activity change and appetite change.  HENT: Negative.   Eyes: Negative.  Negative for blurred vision.  Respiratory: Positive for shortness of breath. Negative for apnea, cough, choking, chest tightness, wheezing and stridor.        She has DOE after walking 1-2 miles  Cardiovascular: Negative.  Negative for chest pain, palpitations, orthopnea, leg swelling and PND.  Gastrointestinal: Negative.  Negative for nausea, abdominal pain, diarrhea, constipation and blood in stool.  Endocrine: Negative.   Genitourinary: Negative.   Musculoskeletal: Negative.  Negative for arthralgias, back pain, myalgias and neck pain.  Skin: Negative.   Allergic/Immunologic: Negative.   Neurological: Negative.  Negative for dizziness, tremors, syncope, weakness, light-headedness and headaches.  Hematological: Negative.  Negative for adenopathy. Does not bruise/bleed easily.  Psychiatric/Behavioral: Negative.        Objective:   Physical Exam  Vitals reviewed. Constitutional: She is oriented to person, place, and time. She appears well-developed and well-nourished. No distress.  HENT:  Head: Normocephalic and atraumatic.  Mouth/Throat: Oropharynx is clear and moist. No oropharyngeal exudate.  Eyes: Conjunctivae are normal. Right eye  exhibits no discharge. Left eye exhibits no discharge. No scleral icterus.  Neck: Normal range of motion. Neck supple. No JVD present. No tracheal deviation present. No thyromegaly present.  Cardiovascular: Normal rate, regular rhythm, normal heart sounds and intact distal pulses.  Exam reveals no gallop and no friction rub.   No murmur heard. Pulmonary/Chest: Effort normal and breath sounds normal. No stridor. No respiratory distress. She has no wheezes. She has no rales. She exhibits no tenderness.  Abdominal: Soft. Bowel sounds are normal. She exhibits no distension and no mass. There is no tenderness. There is no rebound and no guarding.  Musculoskeletal: Normal range of motion. She exhibits no edema and no tenderness.  Lymphadenopathy:    She has no cervical adenopathy.  Neurological: She is oriented to person, place, and time.  Skin: Skin is warm and dry. No rash noted. She is not diaphoretic. No erythema. No pallor.  Psychiatric: She has a normal mood and affect. Her behavior is normal. Judgment and thought content normal.     Lab Results  Component Value Date   WBC 37.5* 07/25/2013   HGB 11.2* 07/25/2013   HCT 34.8 07/25/2013   PLT 300 07/25/2013   GLUCOSE 92 07/25/2013   CHOL 250* 06/21/2012   TRIG 108.0 06/21/2012   HDL 70.90 06/21/2012   LDLDIRECT 160.6 06/21/2012   ALT 8 07/25/2013   AST 12 07/25/2013   NA 141 07/25/2013   K 4.2 07/25/2013   CL 105 01/18/2013   CREATININE 0.8 07/25/2013   BUN 11.4 07/25/2013   CO2 22 07/25/2013       Assessment & Plan:

## 2013-07-26 NOTE — Patient Instructions (Signed)

## 2013-07-27 ENCOUNTER — Telehealth: Payer: Self-pay

## 2013-07-27 NOTE — Telephone Encounter (Signed)
Received fax from pharmacy stating that insurance will not cover crestor  w/o a prior auth. Pt must try or fail atorvastatin, fluvastatin, lovastatin, pravastatin or simvastatin or call  [phone]  to proceed with request. Please advise

## 2013-07-28 ENCOUNTER — Other Ambulatory Visit: Payer: Self-pay | Admitting: Internal Medicine

## 2013-07-28 ENCOUNTER — Ambulatory Visit (HOSPITAL_BASED_OUTPATIENT_CLINIC_OR_DEPARTMENT_OTHER): Payer: BC Managed Care – PPO

## 2013-07-28 ENCOUNTER — Other Ambulatory Visit: Payer: Self-pay | Admitting: Physician Assistant

## 2013-07-28 VITALS — BP 163/99 | HR 74 | Temp 97.6°F | Resp 20

## 2013-07-28 DIAGNOSIS — D509 Iron deficiency anemia, unspecified: Secondary | ICD-10-CM

## 2013-07-28 MED ORDER — FERUMOXYTOL INJECTION 510 MG/17 ML
510.0000 mg | Freq: Once | INTRAVENOUS | Status: AC
  Administered 2013-07-28: 510 mg via INTRAVENOUS
  Filled 2013-07-28: qty 17

## 2013-07-28 MED ORDER — SODIUM CHLORIDE 0.9 % IV SOLN
Freq: Once | INTRAVENOUS | Status: AC
  Administered 2013-07-28: 12:00:00 via INTRAVENOUS

## 2013-07-28 NOTE — Patient Instructions (Signed)
Ferumoxytol injection What is this medicine? FERUMOXYTOL is an iron complex. Iron is used to make healthy red blood cells, which carry oxygen and nutrients throughout the body. This medicine is used to treat iron deficiency anemia in people with chronic kidney disease. This medicine may be used for other purposes; ask your health care provider or pharmacist if you have questions. What should I tell my health care provider before I take this medicine? They need to know if you have any of these conditions: -anemia not caused by low iron levels -high levels of iron in the blood -magnetic resonance imaging (MRI) test scheduled -an unusual or allergic reaction to iron, other medicines, foods, dyes, or preservatives -pregnant or trying to get pregnant -breast-feeding How should I use this medicine? This medicine is for infusion into a vein. It is given by a health care professional in a hospital or clinic setting. Talk to your pediatrician regarding the use of this medicine in children. Special care may be needed. Overdosage: If you think you've taken too much of this medicine contact a poison control center or emergency room at once. Overdosage: If you think you have taken too much of this medicine contact a poison control center or emergency room at once. NOTE: This medicine is only for you. Do not share this medicine with others. What if I miss a dose? It is important not to miss your dose. Call your doctor or health care professional if you are unable to keep an appointment. What may interact with this medicine? This medicine may interact with the following medications: -other iron products This list may not describe all possible interactions. Give your health care provider a list of all the medicines, herbs, non-prescription drugs, or dietary supplements you use. Also tell them if you smoke, drink alcohol, or use illegal drugs. Some items may interact with your medicine. What should I watch  for while using this medicine? Visit your doctor or healthcare professional regularly. Tell your doctor or healthcare professional if your symptoms do not start to get better or if they get worse. You may need blood work done while you are taking this medicine. You may need to follow a special diet. Talk to your doctor. Foods that contain iron include: whole grains/cereals, dried fruits, beans, or peas, leafy green vegetables, and organ meats (liver, kidney). What side effects may I notice from receiving this medicine? Side effects that you should report to your doctor or health care professional as soon as possible: -allergic reactions like skin rash, itching or hives, swelling of the face, lips, or tongue -breathing problems -changes in blood pressure -feeling faint or lightheaded, falls -fever or chills -flushing, sweating, or hot feelings -swelling of the ankles or feet Side effects that usually do not require medical attention (Report these to your doctor or health care professional if they continue or are bothersome.): -diarrhea -headache -nausea, vomiting -stomach pain This list may not describe all possible side effects. Call your doctor for medical advice about side effects. You may report side effects to FDA at 1-800-FDA-1088. Where should I keep my medicine? This drug is given in a hospital or clinic and will not be stored at home. NOTE: This sheet is a summary. It may not cover all possible information. If you have questions about this medicine, talk to your doctor, pharmacist, or health care provider.  2013, Elsevier/Gold Standard. (06/14/2008 9:48:25 PM)  

## 2013-08-02 ENCOUNTER — Telehealth: Payer: Self-pay | Admitting: Internal Medicine

## 2013-08-02 DIAGNOSIS — E785 Hyperlipidemia, unspecified: Secondary | ICD-10-CM

## 2013-08-02 MED ORDER — ATORVASTATIN CALCIUM 40 MG PO TABS: 40.0000 mg | ORAL_TABLET | Freq: Every day | ORAL | Status: DC

## 2013-08-02 NOTE — Telephone Encounter (Signed)
Sandi Mealy, CMA at 07/27/2013 3:42 PM   Status: Signed            Received fax from pharmacy stating that insurance will not cover crestor w/o a prior auth. Pt must try or fail atorvastatin, fluvastatin, lovastatin, pravastatin or simvastatin or call [phone] to proceed with request. Please advise

## 2013-08-23 ENCOUNTER — Ambulatory Visit: Payer: BC Managed Care – PPO | Admitting: Internal Medicine

## 2013-08-29 ENCOUNTER — Encounter: Payer: Self-pay | Admitting: Internal Medicine

## 2013-08-29 ENCOUNTER — Ambulatory Visit (INDEPENDENT_AMBULATORY_CARE_PROVIDER_SITE_OTHER)
Admission: RE | Admit: 2013-08-29 | Discharge: 2013-08-29 | Disposition: A | Discharge status: Home / Self Care | Payer: BC Managed Care – PPO | Source: Ambulatory Visit | Attending: Internal Medicine | Admitting: Internal Medicine

## 2013-08-29 ENCOUNTER — Ambulatory Visit (INDEPENDENT_AMBULATORY_CARE_PROVIDER_SITE_OTHER): Payer: BC Managed Care – PPO | Admitting: Internal Medicine

## 2013-08-29 VITALS — BP 120/78 | HR 57 | Temp 97.7°F | Resp 16 | Ht 67.0 in | Wt 186.0 lb

## 2013-08-29 DIAGNOSIS — M542 Cervicalgia: Secondary | ICD-10-CM

## 2013-08-29 DIAGNOSIS — I10 Essential (primary) hypertension: Secondary | ICD-10-CM

## 2013-08-29 DIAGNOSIS — R221 Localized swelling, mass and lump, neck: Secondary | ICD-10-CM | POA: Insufficient documentation

## 2013-08-29 MED ORDER — OLMESARTAN-AMLODIPINE-HCTZ 40-5-12.5 MG PO TABS: 1.0000 | ORAL_TABLET | Freq: Every day | ORAL | Status: DC

## 2013-08-29 NOTE — Assessment & Plan Note (Addendum)
There is no evidence of radiculopathy I will check a plain film of her neck to see if there is DDD, spurring, etc.

## 2013-08-29 NOTE — Progress Notes (Signed)
Pre visit review using our clinic review tool, if applicable. No additional management support is needed unless otherwise documented below in the visit note. 

## 2013-08-29 NOTE — Patient Instructions (Signed)

## 2013-08-29 NOTE — Assessment & Plan Note (Signed)
Her BP is well controlled 

## 2013-08-29 NOTE — Progress Notes (Signed)
Subjective:    Patient ID: Audrey Gross, female    DOB: 1961-01-24, 52 y.o.   MRN: [id number]  Neck Pain  This is a chronic problem. The current episode started more than 1 month ago (for about 9-10 months). The problem occurs intermittently. The problem has been unchanged. The pain is associated with nothing. The pain is present in the right side. The quality of the pain is described as aching. The pain is at a severity of 1/10. The pain is mild. Nothing aggravates the symptoms. The pain is same all the time. Pertinent negatives include no chest pain, fever, headaches, leg pain, numbness, pain with swallowing, paresis, photophobia, syncope, tingling, trouble swallowing, visual change, weakness or weight loss. She has tried nothing for the symptoms. The treatment provided mild relief.      Review of Systems  Constitutional: Negative.  Negative for fever, chills, weight loss, diaphoresis, appetite change and fatigue.  HENT: Negative.  Negative for trouble swallowing.   Eyes: Negative.  Negative for photophobia.  Respiratory: Negative.  Negative for cough, chest tightness, shortness of breath, wheezing and stridor.   Cardiovascular: Negative.  Negative for chest pain, palpitations, leg swelling and syncope.  Gastrointestinal: Negative.  Negative for nausea, vomiting, abdominal pain, diarrhea, constipation and blood in stool.  Endocrine: Negative.   Genitourinary: Negative.   Musculoskeletal: Positive for neck pain. Negative for arthralgias, back pain, gait problem, joint swelling, myalgias and neck stiffness.  Skin: Negative.   Allergic/Immunologic: Negative.   Neurological: Negative.  Negative for dizziness, tingling, tremors, weakness, light-headedness, numbness and headaches.  Hematological: Negative.  Negative for adenopathy. Does not bruise/bleed easily.  Psychiatric/Behavioral: Negative.        Objective:   Physical Exam  Vitals reviewed. Constitutional: She is oriented to person,  place, and time. She appears well-developed and well-nourished. No distress.  HENT:  Head: Normocephalic and atraumatic.  Mouth/Throat: Oropharynx is clear and moist. No oropharyngeal exudate.  Eyes: Conjunctivae are normal. Right eye exhibits no discharge. Left eye exhibits no discharge. No scleral icterus.  Neck: Normal range of motion. Neck supple. No JVD present. No tracheal deviation present. No thyromegaly present.  Cardiovascular: Normal rate, regular rhythm, normal heart sounds and intact distal pulses.  Exam reveals no gallop and no friction rub.   No murmur heard. Pulmonary/Chest: Effort normal and breath sounds normal. No stridor. No respiratory distress. She has no wheezes. She has no rales. She exhibits no tenderness.  Abdominal: Soft. Bowel sounds are normal. She exhibits no distension and no mass. There is no tenderness. There is no rebound and no guarding.  Musculoskeletal: Normal range of motion. She exhibits no edema and no tenderness.       Cervical back: Normal. She exhibits normal range of motion, no tenderness, no bony tenderness, no swelling, no edema, no deformity, no laceration, no pain, no spasm and normal pulse.  Lymphadenopathy:    She has no cervical adenopathy.  Neurological: She is alert and oriented to person, place, and time. She has normal strength. She displays no atrophy, no tremor and normal reflexes. No cranial nerve deficit or sensory deficit. She exhibits normal muscle tone. She displays a negative Romberg sign. She displays no seizure activity. Coordination and gait normal.  Reflex Scores:      Tricep reflexes are 0 on the right side and 0 on the left side.      Bicep reflexes are 0 on the right side and 0 on the left side.  Brachioradialis reflexes are 0 on the right side and 0 on the left side.      Patellar reflexes are 1+ on the right side and 1+ on the left side.      Achilles reflexes are 1+ on the right side and 1+ on the left side. Skin: Skin  is warm and dry. No rash noted. She is not diaphoretic. No erythema. No pallor.  Psychiatric: She has a normal mood and affect. Her behavior is normal. Judgment and thought content normal.     Lab Results  Component Value Date   WBC 37.5* 07/25/2013   HGB 11.2* 07/25/2013   HCT 34.8 07/25/2013   PLT 300 07/25/2013   GLUCOSE 90 07/26/2013   CHOL 213* 07/26/2013   TRIG 105.0 07/26/2013   HDL 52.90 07/26/2013   LDLDIRECT 144.5 07/26/2013   ALT 13 07/26/2013   AST 14 07/26/2013   NA 138 07/26/2013   K 3.9 07/26/2013   CL 107 07/26/2013   CREATININE 0.8 07/26/2013   BUN 16 07/26/2013   CO2 24 07/26/2013   TSH 0.39 07/26/2013       Assessment & Plan:

## 2013-09-21 ENCOUNTER — Ambulatory Visit (INDEPENDENT_AMBULATORY_CARE_PROVIDER_SITE_OTHER): Payer: BC Managed Care – PPO | Admitting: Internal Medicine

## 2013-09-21 ENCOUNTER — Encounter: Payer: Self-pay | Admitting: Internal Medicine

## 2013-09-21 VITALS — BP 146/84 | HR 68 | Temp 97.6°F | Resp 16 | Ht 67.0 in | Wt 186.0 lb

## 2013-09-21 DIAGNOSIS — R220 Localized swelling, mass and lump, head: Secondary | ICD-10-CM

## 2013-09-21 DIAGNOSIS — R221 Localized swelling, mass and lump, neck: Secondary | ICD-10-CM

## 2013-09-21 DIAGNOSIS — C9110 Chronic lymphocytic leukemia of B-cell type not having achieved remission: Secondary | ICD-10-CM

## 2013-09-21 NOTE — Progress Notes (Signed)
Pre visit review using our clinic review tool, if applicable. No additional management support is needed unless otherwise documented below in the visit note. 

## 2013-09-21 NOTE — Patient Instructions (Signed)
Chronic Lymphocytic Leukemia Chronic lymphocytic leukemia (CLL) is a type of cancer of the bone marrow and blood cells. Bone marrow is the soft, spongy tissue inside your bone. In CLL, the bone marrow makes too many white blood cells that usually fight infection in the body (lymphocytes). CLL usually gets worse slowly and is the most common type of adult leukemia.  RISK FACTORS No one knows the exact cause of CLL. There is a higher risk of CLL in people who:   Are older than 50 years.  Are white.  Are female.  Have a family history of CLL or other cancers of the lymph system.  Are of Russian Jewish or Eastern European Jewish descent.  Have been exposed to certain chemicals, such as Agent Orange (used in the Vietnam War) or other herbicides or insecticides. SYMPTOMS  At first, there may be no symptoms of chronic lymphocytic leukemia. After a while, some symptoms may occur, such as:   Feeling more tired than usual, even after rest.  Unplanned weight loss.  Heavy sweating at night.  Fevers.  Shortness of breath.  Decreased energy.  Paleness.  Painless, swollen lymph nodes.  A feeling of fullness in the upper left part of the abdomen.  Easy bruising or bleeding.  More frequent infections. DIAGNOSIS  Your health care provider may perform the following exams and tests to diagnose CLL:  Physical exam to check for an enlarged spleen, liver, or lymph nodes.  Blood and bone marrow tests to identify the presence of cancer cells. These may include tests such as complete blood count, flow cytometry, immunophenotyping, and fluorescence in situ hybridization (FISH).  CT scan to look for swelling or abnormalities in your spleen, liver, and lymph nodes. TREATMENT  Treatment options for CLL depend on the stage and the presence of symptoms. There are a number of types of treatment used for this condition, including:  Observation.  Targeted drugs. These are drugs that interfere with  chemicals that leukemia cells need in order to grow and multiply. They identify and attack specific cancer cells without harming normal cells.  Chemotherapy drugs. These medicines kill cells that are multiplying quickly, such as leukemia cells.  Radiation.  Surgery to remove the spleen.  Biological therapy. This treatment boosts the ability of your own immune system to fight the leukemia cells.  Bone marrow or peripheral blood stem cell transplant. This treatment allows the patient to receive very high doses of chemotherapy and/or radiation. These high doses kill the cancer cells but also destroy the bone marrow. After treatment is complete, you are given donor bone marrow or stem cells, which will replace the bone marrow. HOME CARE INSTRUCTIONS   Because you have an increased risk of infection, practice good hand washing and avoid being around people who are ill or being in crowded places.  Because you have an increased risk of bleeding and bruising, avoid contact sports or other rough activities.  Only take over-the-counter or prescription medicines for pain, discomfort, or fever as directed by your health care provider.  Although some of your treatments might affect your appetite, try to eat regular, healthy meals.  If you develop any side effects, such as nausea, diarrhea, rash, white patches in your mouth, a sore throat, difficulty swallowing, or severe fatigue, tell your health care provider. He or she may have recommendations of things you can do to improve symptoms.  Consider learning some ways to cope with the stress of having a chronic illness, such as yoga, meditation,   or participating in a support group. SEEK MEDICAL CARE IF:  You develop chest pains.  You notice pain, swelling or redness anywhere in your legs.  You have pain in your belly (abdomen).  You develop new bruises that are getting bigger.  You have painful or more swollen lymph nodes.  You develop bleeding  from your gums, nose, or in your urine or stools.  You are unable to stop throwing up (vomiting).  You cannot keep liquids down.  You feel lightheaded.  You have a fever or persistent symptoms for more than 2 3 days.  You develop a severe stiff neck or headache. SEEK IMMEDIATE MEDICAL CARE IF:  You have trouble breathing or feel short of breath.  You faint. Document Released: 02/08/2009 Document Revised: 05/25/2013 Document Reviewed: 03/17/2013 [employer] Patient Information 2014 [employer].  

## 2013-09-21 NOTE — Progress Notes (Signed)
Subjective:    Patient ID: Audrey Gross, female    DOB: [birth year], 52 y.o.   MRN: [id number]  HPI  She returns today and complains of a painful, swollen lymph node on the right side of her neck. It has been there and has been getting worse over the last 2-3 weeks, she also admits to a few night sweats.  Review of Systems  Constitutional: Positive for chills. Negative for fever, diaphoresis, activity change, appetite change, fatigue and unexpected weight change.  HENT: Negative.  Negative for congestion, facial swelling, nosebleeds, postnasal drip, sore throat, trouble swallowing and voice change.   Respiratory: Negative.  Negative for cough, shortness of breath and stridor.   Cardiovascular: Negative.  Negative for chest pain, palpitations and leg swelling.  Gastrointestinal: Negative.  Negative for abdominal pain.  Endocrine: Negative.   Genitourinary: Negative.   Musculoskeletal: Negative.   Skin: Negative.   Allergic/Immunologic: Negative.   Neurological: Negative.  Negative for dizziness.  Hematological: Positive for adenopathy. Does not bruise/bleed easily.  Psychiatric/Behavioral: Negative.        Objective:   Physical Exam  Vitals reviewed. Constitutional: She is oriented to person, place, and time. She appears well-developed and well-nourished.  Non-toxic appearance. She does not have a sickly appearance. She does not appear ill. No distress.  HENT:  Head: Normocephalic and atraumatic.  Mouth/Throat: Oropharynx is clear and moist. No oropharyngeal exudate.  Eyes: Conjunctivae are normal. Right eye exhibits no discharge. Left eye exhibits no discharge. No scleral icterus.  Neck: Normal range of motion. Neck supple. No JVD present. No tracheal deviation present. No thyromegaly present.    Cardiovascular: Normal rate, regular rhythm, normal heart sounds and intact distal pulses.  Exam reveals no gallop and no friction rub.   No murmur heard. Pulmonary/Chest: Effort  normal and breath sounds normal. No stridor. No respiratory distress. She has no wheezes. She has no rales. She exhibits no tenderness.  Abdominal: Soft. Bowel sounds are normal. She exhibits no distension and no mass. There is no tenderness. There is no rebound and no guarding.  Musculoskeletal: Normal range of motion. She exhibits no edema and no tenderness.  Lymphadenopathy:       Head (right side): No submental, no submandibular, no tonsillar, no preauricular, no posterior auricular and no occipital adenopathy present.       Head (left side): No submental, no submandibular, no tonsillar, no preauricular, no posterior auricular and no occipital adenopathy present.    She has cervical adenopathy.       Right cervical: Posterior cervical adenopathy present. No superficial cervical and no deep cervical adenopathy present.      Left cervical: No superficial cervical, no deep cervical and no posterior cervical adenopathy present.    She has axillary adenopathy.       Right axillary: Lateral ("shoddy" LAD in the right axilla) adenopathy present. No pectoral adenopathy present.       Left axillary: No pectoral and no lateral adenopathy present.      Right: No inguinal, no supraclavicular and no epitrochlear adenopathy present.       Left: No inguinal, no supraclavicular and no epitrochlear adenopathy present.  Neurological: She is oriented to person, place, and time.  Skin: Skin is warm and dry. No rash noted. She is not diaphoretic. No erythema. No pallor.  Psychiatric: She has a normal mood and affect. Her behavior is normal. Judgment and thought content normal.      Lab Results  Component Value Date  WBC 37.5* 07/25/2013   HGB 11.2* 07/25/2013   HCT 34.8 07/25/2013   PLT 300 07/25/2013   GLUCOSE 90 07/26/2013   CHOL 213* 07/26/2013   TRIG 105.0 07/26/2013   HDL 52.90 07/26/2013   LDLDIRECT 144.5 07/26/2013   ALT 13 07/26/2013   AST 14 07/26/2013   NA 138 07/26/2013   K 3.9  07/26/2013   CL 107 07/26/2013   CREATININE 0.8 07/26/2013   BUN 16 07/26/2013   CO2 24 07/26/2013   TSH 0.39 07/26/2013      Assessment & Plan:

## 2013-09-25 ENCOUNTER — Encounter: Payer: Self-pay | Admitting: Internal Medicine

## 2013-09-25 NOTE — Assessment & Plan Note (Signed)
I have asked her to see her HemeOnc MD as soon as possible

## 2013-09-25 NOTE — Assessment & Plan Note (Signed)
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

## 2013-09-26 ENCOUNTER — Ambulatory Visit (INDEPENDENT_AMBULATORY_CARE_PROVIDER_SITE_OTHER)
Admission: RE | Admit: 2013-09-26 | Discharge: 2013-09-26 | Disposition: A | Discharge status: Home / Self Care | Payer: BC Managed Care – PPO | Source: Ambulatory Visit | Attending: Internal Medicine | Admitting: Internal Medicine

## 2013-09-26 DIAGNOSIS — R220 Localized swelling, mass and lump, head: Secondary | ICD-10-CM

## 2013-09-26 DIAGNOSIS — R221 Localized swelling, mass and lump, neck: Secondary | ICD-10-CM

## 2013-09-26 MED ORDER — IOHEXOL 300 MG/ML  SOLN
80.0000 mL | Freq: Once | INTRAMUSCULAR | Status: AC | PRN
  Administered 2013-09-26: 80 mL via INTRAVENOUS

## 2013-09-26 NOTE — Addendum Note (Signed)
Addended by: Etta Grandchild on: 09/26/2013 11:56 AM   Modules accepted: Orders

## 2013-10-17 ENCOUNTER — Ambulatory Visit (HOSPITAL_BASED_OUTPATIENT_CLINIC_OR_DEPARTMENT_OTHER): Payer: BC Managed Care – PPO | Admitting: Internal Medicine

## 2013-10-17 ENCOUNTER — Other Ambulatory Visit (HOSPITAL_BASED_OUTPATIENT_CLINIC_OR_DEPARTMENT_OTHER): Payer: BC Managed Care – PPO

## 2013-10-17 ENCOUNTER — Encounter: Payer: Self-pay | Admitting: Internal Medicine

## 2013-10-17 ENCOUNTER — Telehealth: Payer: Self-pay | Admitting: Internal Medicine

## 2013-10-17 VITALS — BP 161/86 | HR 72 | Temp 97.2°F | Resp 18 | Ht 67.0 in | Wt 184.6 lb

## 2013-10-17 DIAGNOSIS — C9110 Chronic lymphocytic leukemia of B-cell type not having achieved remission: Secondary | ICD-10-CM

## 2013-10-17 DIAGNOSIS — I10 Essential (primary) hypertension: Secondary | ICD-10-CM

## 2013-10-17 DIAGNOSIS — R599 Enlarged lymph nodes, unspecified: Secondary | ICD-10-CM

## 2013-10-17 DIAGNOSIS — D509 Iron deficiency anemia, unspecified: Secondary | ICD-10-CM

## 2013-10-17 DIAGNOSIS — D500 Iron deficiency anemia secondary to blood loss (chronic): Secondary | ICD-10-CM

## 2013-10-17 DIAGNOSIS — N920 Excessive and frequent menstruation with regular cycle: Secondary | ICD-10-CM

## 2013-10-17 LAB — COMPREHENSIVE METABOLIC PANEL (CC13)
ALK PHOS: 44 U/L (ref 40–150)
ALT: 16 U/L (ref 0–55)
ANION GAP: 12 meq/L — AB (ref 3–11)
AST: 17 U/L (ref 5–34)
Albumin: 4.5 g/dL (ref 3.5–5.0)
BILIRUBIN TOTAL: 0.57 mg/dL (ref 0.20–1.20)
BUN: 14.5 mg/dL (ref 7.0–26.0)
CO2: 25 mEq/L (ref 22–29)
Calcium: 9.8 mg/dL (ref 8.4–10.4)
Chloride: 106 mEq/L (ref 98–109)
Creatinine: 0.8 mg/dL (ref 0.6–1.1)
Glucose: 87 mg/dl (ref 70–140)
Potassium: 3.6 mEq/L (ref 3.5–5.1)
SODIUM: 143 meq/L (ref 136–145)
TOTAL PROTEIN: 7.7 g/dL (ref 6.4–8.3)

## 2013-10-17 LAB — TECHNOLOGIST REVIEW

## 2013-10-17 LAB — CBC WITH DIFFERENTIAL/PLATELET
BASO%: 0 % (ref 0.0–2.0)
Basophils Absolute: 0 10*3/uL (ref 0.0–0.1)
EOS%: 0.2 % (ref 0.0–7.0)
Eosinophils Absolute: 0.1 10*3/uL (ref 0.0–0.5)
HCT: 35.2 % (ref 34.8–46.6)
HGB: 11.4 g/dL — ABNORMAL LOW (ref 11.6–15.9)
LYMPH%: 89.6 % — AB (ref 14.0–49.7)
MCH: 29.3 pg (ref 25.1–34.0)
MCHC: 32.3 g/dL (ref 31.5–36.0)
MCV: 90.5 fL (ref 79.5–101.0)
MONO#: 0.3 10*3/uL (ref 0.1–0.9)
MONO%: 0.5 % (ref 0.0–14.0)
NEUT#: 4.6 10*3/uL (ref 1.5–6.5)
NEUT%: 9.7 % — ABNORMAL LOW (ref 38.4–76.8)
PLATELETS: 224 10*3/uL (ref 145–400)
RBC: 3.89 10*6/uL (ref 3.70–5.45)
RDW: 15.2 % — ABNORMAL HIGH (ref 11.2–14.5)
WBC: 46.9 10*3/uL — AB (ref 3.9–10.3)
lymph#: 42 10*3/uL — ABNORMAL HIGH (ref 0.9–3.3)

## 2013-10-17 LAB — FERRITIN CHCC: Ferritin: 106 ng/ml (ref 9–269)

## 2013-10-17 LAB — IRON AND TIBC CHCC
%SAT: 21 % (ref 21–57)
Iron: 57 ug/dL (ref 41–142)
TIBC: 274 ug/dL (ref 236–444)
UIBC: 217 ug/dL (ref 120–384)

## 2013-10-17 NOTE — Patient Instructions (Signed)
Chronic Lymphocytic Leukemia Chronic lymphocytic leukemia (CLL) is a type of cancer of the bone marrow and blood cells. Bone marrow is the soft, spongy tissue inside your bone. In CLL, the bone marrow makes too many white blood cells that usually fight infection in the body (lymphocytes). CLL usually gets worse slowly and is the most common type of adult leukemia.  RISK FACTORS No one knows the exact cause of CLL. There is a higher risk of CLL in people who:   Are older than 50 years.  Are white.  Are female.  Have a family history of CLL or other cancers of the lymph system.  Are of Russian Jewish or Eastern European Jewish descent.  Have been exposed to certain chemicals, such as Agent Orange (used in the Vietnam War) or other herbicides or insecticides. SYMPTOMS  At first, there may be no symptoms of chronic lymphocytic leukemia. After a while, some symptoms may occur, such as:   Feeling more tired than usual, even after rest.  Unplanned weight loss.  Heavy sweating at night.  Fevers.  Shortness of breath.  Decreased energy.  Paleness.  Painless, swollen lymph nodes.  A feeling of fullness in the upper left part of the abdomen.  Easy bruising or bleeding.  More frequent infections. DIAGNOSIS  Your health care provider may perform the following exams and tests to diagnose CLL:  Physical exam to check for an enlarged spleen, liver, or lymph nodes.  Blood and bone marrow tests to identify the presence of cancer cells. These may include tests such as complete blood count, flow cytometry, immunophenotyping, and fluorescence in situ hybridization (FISH).  CT scan to look for swelling or abnormalities in your spleen, liver, and lymph nodes. TREATMENT  Treatment options for CLL depend on the stage and the presence of symptoms. There are a number of types of treatment used for this condition, including:  Observation.  Targeted drugs. These are drugs that interfere with  chemicals that leukemia cells need in order to grow and multiply. They identify and attack specific cancer cells without harming normal cells.  Chemotherapy drugs. These medicines kill cells that are multiplying quickly, such as leukemia cells.  Radiation.  Surgery to remove the spleen.  Biological therapy. This treatment boosts the ability of your own immune system to fight the leukemia cells.  Bone marrow or peripheral blood stem cell transplant. This treatment allows the patient to receive very high doses of chemotherapy and/or radiation. These high doses kill the cancer cells but also destroy the bone marrow. After treatment is complete, you are given donor bone marrow or stem cells, which will replace the bone marrow. HOME CARE INSTRUCTIONS   Because you have an increased risk of infection, practice good hand washing and avoid being around people who are ill or being in crowded places.  Because you have an increased risk of bleeding and bruising, avoid contact sports or other rough activities.  Only take over-the-counter or prescription medicines for pain, discomfort, or fever as directed by your health care provider.  Although some of your treatments might affect your appetite, try to eat regular, healthy meals.  If you develop any side effects, such as nausea, diarrhea, rash, white patches in your mouth, a sore throat, difficulty swallowing, or severe fatigue, tell your health care provider. He or she may have recommendations of things you can do to improve symptoms.  Consider learning some ways to cope with the stress of having a chronic illness, such as yoga, meditation,   or participating in a support group. SEEK MEDICAL CARE IF:  You develop chest pains.  You notice pain, swelling or redness anywhere in your legs.  You have pain in your belly (abdomen).  You develop new bruises that are getting bigger.  You have painful or more swollen lymph nodes.  You develop bleeding  from your gums, nose, or in your urine or stools.  You are unable to stop throwing up (vomiting).  You cannot keep liquids down.  You feel lightheaded.  You have a fever or persistent symptoms for more than 2 3 days.  You develop a severe stiff neck or headache. SEEK IMMEDIATE MEDICAL CARE IF:  You have trouble breathing or feel short of breath.  You faint. Document Released: 02/08/2009 Document Revised: 05/25/2013 Document Reviewed: 03/17/2013 [employer] Patient Information 2014 [employer].  

## 2013-10-17 NOTE — Progress Notes (Signed)
Audrey Gross OFFICE PROGRESS NOTE  [name], MD [facility] [place], [address] [place] [address]  DIAGNOSIS: CLL (chronic lymphocytic leukemia) - Plan: CBC with Differential, CBC with Differential, CBC with Differential, CBC with Differential, Comprehensive metabolic panel, Lactate dehydrogenase  Iron deficiency anemia  Chief Complaint  Patient presents with  . CLL (chronic lymphocytic leukemia)    CURRENT THERAPY: 1. CLL on observation. 2. Iron deficiency anemia due to menstrual bleeding on IV Feraheme when necessary.  INTERVAL HISTORY: Audrey Gross 53 y.o. female iron deficiency anemia, CLL and cystic breast is here for followup. She was last seen by me on 07/27/2013. Today, patient is accompanied by her husband Audrey Gross. She saw Dr. Ronnald Ramp on 08/29/2013 for neck pain and then on 09/21/2013 for lymphadenopathy on the right.  She  reports having a CT of neck on 09/26/2013 which demonstrated generalized right neck and thoracic inlet lymphadenopathy compatible with acute CLL or lymphoma with a conglomeration of abnormal level 3 nodes corresponding to the area of clinical concern.  There was also mild left neck and mild to moderate left subclavian lymphadenopathy.  Dr. Ronnald Ramp scheduled her to see [facility] (Dr. Izora Gala) today.  She reports some improvement in neck pain and size of nodes have also gotten smaller.   She reports her last mammogram was done January 2013. She also endorses increased anxiety when doing mammograms. Denies any infections any fevers or chills, or an acute shortness of breath, any weight changes. She is up-to-date for colonoscopy which was done by Dr. Ardis Hughs on 04/07/2012. She she reports heavy menses. She described it as low as normal lasting 5-7 days, one to 2 packs per day. He denies any [place] or dyspnea on exertion or fatigue or chest pain or palpitations.  MEDICAL HISTORY: Past Medical History  Diagnosis  Date  . Hypertension   . CLL (chronic lymphoblastic leukemia)   . Depression   . Iron deficiency anemia, unspecified   . Diffuse cystic mastopathy   . Cystic breast   . Systolic ejection murmur   . 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    ALLERGIES:  has No Known Allergies.  MEDICATIONS: has a current medication list which includes the following prescription(s): atorvastatin and olmesartan-amlodipine-hctz.  SURGICAL HISTORY:  Past Surgical History  Procedure Laterality Date  . Tubal ligation      REVIEW OF SYSTEMS:   Constitutional: Denies fevers, chills or abnormal weight loss Eyes: Denies blurriness of vision Ears, nose, mouth, throat, and face: Denies mucositis or sore throat Respiratory: Denies cough, dyspnea or wheezes Cardiovascular: Denies palpitation, chest discomfort or lower extremity swelling Gastrointestinal:  Denies nausea, heartburn or change in bowel habits Skin: Denies abnormal skin rashes Lymphatics: Reports lymphadenopathy as noted in HPI but denies easy bruising Neurological:Denies numbness, tingling or new weaknesses Behavioral/Psych: Mood is stable, no new changes  All other systems were reviewed with the patient and are negative.  PHYSICAL EXAMINATION: ECOG PERFORMANCE STATUS: 0 - Asymptomatic  Blood pressure 161/86, pulse 72, temperature 97.2 F (36.2 C), resp. rate 18, height 5\' 7"  (1.702 m), weight 184 lb 9.6 oz (83.734 kg), last menstrual  period 07/20/2013.  GENERAL:alert, no distress and comfortable and anxious well-developed well-nourished female, mildly obese. SKIN: skin  color, texture, turgor are normal, no rashes or significant lesions EYES: normal, Conjunctiva are pink and non-injected, sclera clear OROPHARYNX:no exudate, no erythema and lips, buccal mucosa, and tongue normal; Poor dentition.  NECK: supple, thyroid fullness, non-tender, without nodularity LYMPH:  no palpable lymphadenopathy in the axillary or supraclavicular; multiple lymphadenopathy in the cervical with R greater than left and largest node greater than 1 cm.  LUNGS: clear to auscultation and percussion with normal breathing effort HEART: regular rate & rhythm and mild systolic ejection murmur  and no lower extremity edema ABDOMEN:abdomen soft, non-tender and normal bowel sounds Musculoskeletal:no cyanosis of digits and no clubbing  NEURO: alert & oriented x 3 with fluent speech, no focal motor/sensory deficits   LABORATORY DATA: Results for orders placed in visit on 10/17/13 (from the past 48 hour(s))  CBC WITH DIFFERENTIAL     Status: Abnormal   Collection Time    10/17/13  9:31 AM      Result Value Range   WBC 46.9 (*) 3.9 - 10.3 10e3/uL   NEUT# 4.6  1.5 - 6.5 10e3/uL   HGB 11.4 (*) 11.6 - 15.9 g/dL   HCT 35.2  34.8 - 46.6 %   Platelets 224  145 - 400 10e3/uL   MCV 90.5  79.5 - 101.0 fL   MCH 29.3  25.1 - 34.0 pg   MCHC 32.3  31.5 - 36.0 g/dL   RBC 3.89  3.70 - 5.45 10e6/uL   RDW 15.2 (*) 11.2 - 14.5 %   lymph# 42.0 (*) 0.9 - 3.3 10e3/uL   MONO# 0.3  0.1 - 0.9 10e3/uL   Eosinophils Absolute 0.1  0.0 - 0.5 10e3/uL   Basophils Absolute 0.0  0.0 - 0.1 10e3/uL   NEUT% 9.7 (*) 38.4 - 76.8 %   LYMPH% 89.6 (*) 14.0 - 49.7 %   MONO% 0.5  0.0 - 14.0 %   EOS% 0.2  0.0 - 7.0 %   BASO% 0.0  0.0 - 2.0 %  FERRITIN [facility]     Status: None   Collection Time    10/17/13  9:31 AM      Result Value Range   Ferritin 106  9 - 269 ng/ml  IRON AND TIBC [facility]     Status: None   Collection Time    10/17/13  9:31 AM      Result Value Range   Iron 57  41 - 142 ug/dL   TIBC 274  236 - 444 ug/dL    UIBC 217  120 - 384 ug/dL   %SAT 21  21 - 57 %  TECHNOLOGIST REVIEW     Status: None   Collection Time    10/17/13  9:31 AM      Result Value Range   Technologist Review Variant lymphs present and few smudge cells    COMPREHENSIVE METABOLIC PANEL (0000000)     Status: Abnormal   Collection Time    10/17/13  9:31 AM      Result Value Range   Sodium 143  136 - 145 mEq/L   Potassium 3.6  3.5 - 5.1 mEq/L   Chloride 106  98 - 109 mEq/L   CO2 25  22 - 29 mEq/L   Glucose 87  70 - 140 mg/dl   BUN 14.5  7.0 - 26.0 mg/dL   Creatinine 0.8  0.6 - 1.1 mg/dL   Total  Bilirubin 0.57  0.20 - 1.20 mg/dL   Alkaline Phosphatase 44  40 - 150 U/L   AST 17  5 - 34 U/L   ALT 16  0 - 55 U/L   Total Protein 7.7  6.4 - 8.3 g/dL   Albumin 4.5  3.5 - 5.0 g/dL   Calcium 9.8  8.4 - 41.4 mg/dL   Anion Gap 12 (*) 3 - 11 mEq/L    Labs:  Lab Results  Component Value Date   WBC 46.9* 10/17/2013   HGB 11.4* 10/17/2013   HCT 35.2 10/17/2013   MCV 90.5 10/17/2013   PLT 224 10/17/2013   NEUTROABS 4.6 10/17/2013      Chemistry      Component Value Date/Time   NA 143 10/17/2013 0931   NA 138 07/26/2013 0934   K 3.6 10/17/2013 0931   K 3.9 07/26/2013 0934   CL 107 07/26/2013 0934   CL 105 01/18/2013 0912   CO2 25 10/17/2013 0931   CO2 24 07/26/2013 0934   BUN 14.5 10/17/2013 0931   BUN 16 07/26/2013 0934   CREATININE 0.8 10/17/2013 0931   CREATININE 0.8 07/26/2013 0934      Component Value Date/Time   CALCIUM 9.8 10/17/2013 0931   CALCIUM 9.1 07/26/2013 0934   ALKPHOS 44 10/17/2013 0931   ALKPHOS 44 07/26/2013 0934   AST 17 10/17/2013 0931   AST 14 07/26/2013 0934   ALT 16 10/17/2013 0931   ALT 13 07/26/2013 0934   BILITOT 0.57 10/17/2013 0931   BILITOT 0.7 07/26/2013 0934     Basic Metabolic Panel:  Recent Labs Lab 10/17/13 0931  NA 143  K 3.6  CO2 25  GLUCOSE 87  BUN 14.5  CREATININE 0.8  CALCIUM 9.8   GFR Estimated Creatinine Clearance: 91.4 ml/min (by C-G formula based on Cr of 0.8). Liver  Function Tests:  Recent Labs Lab 10/17/13 0931  AST 17  ALT 16  ALKPHOS 44  BILITOT 0.57  PROT 7.7  ALBUMIN 4.5   No results found for this basename: LIPASE, AMYLASE,  in the last 168 hours No results found for this basename: AMMONIA,  in the last 168 hours Coagulation profile No results found for this basename: INR, PROTIME,  in the last 168 hours  CBC:  Recent Labs Lab 10/17/13 0931  WBC 46.9*  NEUTROABS 4.6  HGB 11.4*  HCT 35.2  MCV 90.5  PLT 224   Results for Audrey Gross, Audrey Gross (MRN [id number]) as of 10/18/2013 12:39  Ref. Range 07/26/2013 09:34  TSH Latest Range: 0.35-5.50 uIU/mL 0.39   Recent Results (from the past 240 hour(s))  TECHNOLOGIST REVIEW     Status: None   Collection Time    10/17/13  9:31 AM      Result Value Range Status   Technologist Review Variant lymphs present and few smudge cells   Final  Results for Audrey Gross, Audrey Gross (MRN [id number]) as of 10/18/2013 12:39  Ref. Range 10/19/2012 09:14 01/18/2013 09:12 04/18/2013 09:02 07/25/2013 09:53 10/17/2013 09:31  WBC Latest Range: 3.9-10.3 10e3/uL 25.0 (H) 37.6 (H) 49.1 (H) 37.5 (H) 46.9 (H)    Results for Audrey Gross, Audrey Gross (MRN [id number]) as of 10/18/2013 12:39  Ref. Range 07/25/2013 09:53 10/17/2013 09:31  Iron Latest Range: 42-145 ug/dL 30 (L) 57  UIBC Latest Range: 125-400 ug/dL 155 208  TIBC Latest Range: 250-470 ug/dL 022 336  %SAT Latest Range: 20-55 % 10 (L) 21  Ferritin Latest Range: 10-291 ng/mL 26 106  Studies:  No results found.   RADIOGRAPHIC STUDIES: CT NECK WITH CONTRAST   (Personally reviewed by me with the patient).  TECHNIQUE:  Multidetector CT imaging of the neck was performed using the  standard protocol following the bolus administration of intravenous  contrast.  CONTRAST: 65mL OMNIPAQUE IOHEXOL 300 MG/ML SOLN  COMPARISON: Cervical spine radiographs 08/29/2013.  FINDINGS:  There is right level 4 lymphadenopathy in the form of increased size  and number of lymph nodes. Increased  number of small nodes at the  left level 4 station (individually up to 11 mm short axis).  Increased size and number of left subclavian station nodes  (individually up to 10 mm short axis). Right neck lymphadenopathy  tracks cephalad and continues to the area of clinical concern which  corresponds to the border of right level 2 B and IIIb. There is a  conglomeration of abnormal nodes here individually measuring up to  13 mm short axis. There is also right level 1 and level 2  lymphadenopathy. Small but asymmetric right level 5 nodes. Left  level 1, level 2, and level 3 nodes are mildly abnormal, primarily  increased in number (measuring up to 9 mm short axis).  Negative lung apices. Abnormal increased right peritracheal  space-occupying lesion, but with fluid densitometry (series 3 images  62 and 63).  At the thoracic inlet there is generalized thyromegaly. No discrete  thyroid nodule or mass.  Negative retropharyngeal space, larynx, pharynx, parapharyngeal  spaces, sublingual space, submandibular glands, and parotid glands  (except for upper limits of normal parotid space lymph nodes).  Negative visualized brain parenchyma. Major vascular structures in  the neck and at the skullbase are patent, with mild to moderate  effacement of the right IJ. Visualized orbit soft tissues are within  normal limits. Chronic right sphenoid sinusitis. Minimal mucosal  thickening elsewhere in the visible paranasal sinuses. Mastoids are  clear. Scattered poor dentition. No acute or suspicious osseous  lesion.  IMPRESSION:  1. Generalized right neck and thoracic inlet lymphadenopathy  compatible with acute CLL or lymphoma. A conglomeration of abnormal  level 3 nodes corresponds to the area of clinical concern. Mild left  neck and mild to moderate left subclavian lymphadenopathy.  2. Thyromegaly without discrete thyroid lesion.  3. Suggestion of increased fluid signal in the right peritracheal  superior  mediastinum station, might be fluid in a superior  pericardial recess.  Electronically Signed  By: Lars Pinks M.D.  On: 09/26/2013 11:32          Result Notes    Notes Recorded by Janith Lima, MD on 09/26/2013 at 11:42 AM Dr. Juliann Mule - please let me know your thought about this, I have referred her to ENT but now I think she may need to see HemeOnc first.     ASSESSMENT: Audrey Gross 53 y.o. female with a history of CLL (chronic lymphocytic leukemia) - Plan: CBC with Differential, CBC with Differential, CBC with Differential, CBC with Differential, Comprehensive metabolic panel, Lactate dehydrogenase  Iron deficiency anemia  PLAN:  1. CLL, Stage I now with associated lymphadenopathy. --The patient was informed about her rising white count 46.9, up from 37.5 on 07/25/2013. She denies any night sweats, fevers or chills. As noted above, she reports lymphadenopathy.  Likely secondary to CLL given its general distribution.  We reviewed indications for treatment includes worsening symptoms such as painful lymphadenopathy or recurrent infections and/or weight lost, night sweats.   She is scheduled to see ENT.     --  Her anemia as detailed #2 is thought to be due to her menses and not related to her CLL. I reviewed extensively the diagnosis of CLL, symptoms of CLL and treatment options. She was also provided a handout detailing CLL.  --Should she require treatment, we would consider treatment with Obinutuzumab plus chlorambucil [facility] [place], NEJM [id number] December 23, 2012). We will follow her more frequently for symptoms of recurrent infections and worsening thrombocytopenia. Labs will be repeated in 2 weeks and follow-up in one month. She will require hepatitis b testing prior to starting obinutuzumab. In the above study, chlorambucil was given orally at a dose of 0.5 mg per kilogram of body weight on days 1 and 15 and obinutuzumab was administered IV at a doses of 1000 mg on days, 1, 8, 15  of cycle 1 and on day 1 of cycles 2 through 6. After an amendment to the study obinutuzumab was administered over days.   2. Iron deficiency anemia due to menses. --Her iron deficiency anemia indices were reviewed and revealed a ferritin of 106  From 26 over  3 months ago.  Her iron level was 57 which is normal.  Her hemoglobin was 11.4  Up from 11.2 nearly 3 months ago. Her RDW has decreased 15.2. This is consistent with iron deficiency anemia.   --She was given We plan to give her 510 mg of IV feraheme on 07/28/2013. We'll recheck her iron indices on her followup appointment with [place] in 2 months. Last visit, she was also provided a handout on iron deficiency anemia. She understood the risks of IV feraheme includes it is not limited to severe hypersensitivity reactions or hypotension and more commonly nausea and/or diarrhea. He understood the above and agreed to proceed as needed for her anemia.   3. History of breast cysts. --She had a mammogram at [facility] on 10/09/2011 with a cyst aspiration which was not sent for cytology. Reports increased anxiety over having an additional mammogram so therefore she has not gone. We advised her strongly to followup with repeat mammogram this year.  4. Hypertension. --Patient's blood pressure was moderately elevated today.  Her last visit in July 2014 also demonstrated elevated blood pressures of 190/106. She reports that her blood pressures at home or usually 140s over 70s. She reports increased anxiety when visiting the doctor's office. She reports compliance to her medications including her antihypertensives.   5. Thyroid fullness.  --This was noted on CT of Neck.   Her last TSH was within normal limits.   6. Followup. --Patient was instructed to followup with her primary care physician's office for repeat blood pressure measurements. She likely will require those modifications for her antihypertensive therapy. She should followup in 2 months with  CBC and chemistries on the day of her next visit.  She will require biweekly labs to establish trend in her WBCs.  She was provided after visit summary.  All questions were answered. The patient knows to call the clinic with any problems, questions or concerns. We can certainly see the patient much sooner if necessary.  I spent 25 minutes counseling the patient face to face. The total time spent in the appointment was 40 minutes.    Steven Basso, MD 10/18/2013 12:37 PM

## 2013-10-17 NOTE — Telephone Encounter (Signed)
GV AND PRINTED APPT SCHED AND AVS FOR PT FOR Audrey Gross MARCH

## 2013-10-31 ENCOUNTER — Other Ambulatory Visit (HOSPITAL_BASED_OUTPATIENT_CLINIC_OR_DEPARTMENT_OTHER): Payer: BC Managed Care – PPO

## 2013-10-31 DIAGNOSIS — C9110 Chronic lymphocytic leukemia of B-cell type not having achieved remission: Secondary | ICD-10-CM

## 2013-10-31 LAB — CBC WITH DIFFERENTIAL/PLATELET
BASO%: 0.8 % (ref 0.0–2.0)
Basophils Absolute: 0.4 10*3/uL — ABNORMAL HIGH (ref 0.0–0.1)
EOS ABS: 0.1 10*3/uL (ref 0.0–0.5)
EOS%: 0.3 % (ref 0.0–7.0)
HEMATOCRIT: 33.8 % — AB (ref 34.8–46.6)
HEMOGLOBIN: 10.9 g/dL — AB (ref 11.6–15.9)
LYMPH%: 88.7 % — ABNORMAL HIGH (ref 14.0–49.7)
MCH: 29.3 pg (ref 25.1–34.0)
MCHC: 32.4 g/dL (ref 31.5–36.0)
MCV: 90.5 fL (ref 79.5–101.0)
MONO#: 0.4 10*3/uL (ref 0.1–0.9)
MONO%: 0.7 % (ref 0.0–14.0)
NEUT%: 9.5 % — AB (ref 38.4–76.8)
NEUTROS ABS: 4.6 10*3/uL (ref 1.5–6.5)
PLATELETS: 239 10*3/uL (ref 145–400)
RBC: 3.73 10*6/uL (ref 3.70–5.45)
RDW: 14.6 % — ABNORMAL HIGH (ref 11.2–14.5)
WBC: 48.4 10*3/uL — AB (ref 3.9–10.3)
lymph#: 42.9 10*3/uL — ABNORMAL HIGH (ref 0.9–3.3)

## 2013-11-14 ENCOUNTER — Other Ambulatory Visit (HOSPITAL_BASED_OUTPATIENT_CLINIC_OR_DEPARTMENT_OTHER): Payer: BC Managed Care – PPO

## 2013-11-14 DIAGNOSIS — C9110 Chronic lymphocytic leukemia of B-cell type not having achieved remission: Secondary | ICD-10-CM

## 2013-11-14 LAB — MANUAL DIFFERENTIAL
ALC: 58.1 10*3/uL — ABNORMAL HIGH (ref 0.9–3.3)
ANC (CHCC manual diff): 3.7 10*3/uL (ref 1.5–6.5)
Band Neutrophils: 0 % (ref 0–10)
Basophil: 0 % (ref 0–2)
Blasts: 0 % (ref 0–0)
EOS%: 0 % (ref 0–7)
LYMPH: 93 % — ABNORMAL HIGH (ref 14–49)
MONO: 1 % (ref 0–14)
MYELOCYTES: 0 % (ref 0–0)
Metamyelocytes: 0 % (ref 0–0)
Other Cell: 0 % (ref 0–0)
PLT EST: ADEQUATE
PROMYELO: 0 % (ref 0–0)
SEG: 6 % — AB (ref 38–77)
Variant Lymph: 0 % (ref 0–0)
nRBC: 0 % (ref 0–0)

## 2013-11-14 LAB — CBC WITH DIFFERENTIAL/PLATELET
HCT: 33.4 % — ABNORMAL LOW (ref 34.8–46.6)
HGB: 10.7 g/dL — ABNORMAL LOW (ref 11.6–15.9)
MCH: 28.7 pg (ref 25.1–34.0)
MCHC: 32 g/dL (ref 31.5–36.0)
MCV: 89.5 fL (ref 79.5–101.0)
PLATELETS: 249 10*3/uL (ref 145–400)
RBC: 3.73 10*6/uL (ref 3.70–5.45)
RDW: 14.5 % (ref 11.2–14.5)
WBC: 62.5 10*3/uL (ref 3.9–10.3)

## 2013-11-28 ENCOUNTER — Telehealth: Payer: Self-pay | Admitting: Internal Medicine

## 2013-11-28 ENCOUNTER — Other Ambulatory Visit (HOSPITAL_BASED_OUTPATIENT_CLINIC_OR_DEPARTMENT_OTHER): Payer: BC Managed Care – PPO

## 2013-11-28 DIAGNOSIS — C9110 Chronic lymphocytic leukemia of B-cell type not having achieved remission: Secondary | ICD-10-CM

## 2013-11-28 DIAGNOSIS — D500 Iron deficiency anemia secondary to blood loss (chronic): Secondary | ICD-10-CM

## 2013-11-28 LAB — CBC WITH DIFFERENTIAL/PLATELET
BASO%: 0.7 % (ref 0.0–2.0)
BASOS ABS: 0.5 10*3/uL — AB (ref 0.0–0.1)
EOS%: 0.1 % (ref 0.0–7.0)
Eosinophils Absolute: 0.1 10*3/uL (ref 0.0–0.5)
HEMATOCRIT: 35.2 % (ref 34.8–46.6)
HGB: 11.2 g/dL — ABNORMAL LOW (ref 11.6–15.9)
LYMPH%: 92.7 % — ABNORMAL HIGH (ref 14.0–49.7)
MCH: 29.3 pg (ref 25.1–34.0)
MCHC: 31.9 g/dL (ref 31.5–36.0)
MCV: 92 fL (ref 79.5–101.0)
MONO#: 0.5 10*3/uL (ref 0.1–0.9)
MONO%: 0.7 % (ref 0.0–14.0)
NEUT#: 4.3 10*3/uL (ref 1.5–6.5)
NEUT%: 5.8 % — AB (ref 38.4–76.8)
Platelets: 287 10*3/uL (ref 145–400)
RBC: 3.83 10*6/uL (ref 3.70–5.45)
RDW: 14.3 % (ref 11.2–14.5)
WBC: 73.2 10*3/uL (ref 3.9–10.3)
lymph#: 67.8 10*3/uL — ABNORMAL HIGH (ref 0.9–3.3)

## 2013-11-28 LAB — TECHNOLOGIST REVIEW

## 2013-11-28 NOTE — Telephone Encounter (Signed)
Instructed patient husband to tell the patient to call us back with questions.  She will follow-up with [place] on 03/09.

## 2013-12-12 ENCOUNTER — Encounter: Payer: Self-pay | Admitting: Internal Medicine

## 2013-12-12 ENCOUNTER — Telehealth: Payer: Self-pay | Admitting: Internal Medicine

## 2013-12-12 ENCOUNTER — Other Ambulatory Visit (HOSPITAL_BASED_OUTPATIENT_CLINIC_OR_DEPARTMENT_OTHER): Payer: BC Managed Care – PPO

## 2013-12-12 ENCOUNTER — Ambulatory Visit (HOSPITAL_BASED_OUTPATIENT_CLINIC_OR_DEPARTMENT_OTHER): Payer: BC Managed Care – PPO | Admitting: Internal Medicine

## 2013-12-12 VITALS — BP 149/86 | HR 70 | Temp 98.1°F | Resp 18 | Ht 67.0 in | Wt 183.2 lb

## 2013-12-12 DIAGNOSIS — D509 Iron deficiency anemia, unspecified: Secondary | ICD-10-CM

## 2013-12-12 DIAGNOSIS — C9110 Chronic lymphocytic leukemia of B-cell type not having achieved remission: Secondary | ICD-10-CM

## 2013-12-12 DIAGNOSIS — K089 Disorder of teeth and supporting structures, unspecified: Secondary | ICD-10-CM

## 2013-12-12 DIAGNOSIS — I10 Essential (primary) hypertension: Secondary | ICD-10-CM

## 2013-12-12 LAB — COMPREHENSIVE METABOLIC PANEL (CC13)
ALBUMIN: 4.6 g/dL (ref 3.5–5.0)
ALK PHOS: 55 U/L (ref 40–150)
ALT: 17 U/L (ref 0–55)
AST: 17 U/L (ref 5–34)
Anion Gap: 10 mEq/L (ref 3–11)
BILIRUBIN TOTAL: 0.72 mg/dL (ref 0.20–1.20)
BUN: 10.4 mg/dL (ref 7.0–26.0)
CO2: 26 mEq/L (ref 22–29)
Calcium: 9.7 mg/dL (ref 8.4–10.4)
Chloride: 105 mEq/L (ref 98–109)
Creatinine: 0.8 mg/dL (ref 0.6–1.1)
Glucose: 108 mg/dl (ref 70–140)
POTASSIUM: 3.3 meq/L — AB (ref 3.5–5.1)
SODIUM: 142 meq/L (ref 136–145)
TOTAL PROTEIN: 7.9 g/dL (ref 6.4–8.3)

## 2013-12-12 LAB — CBC WITH DIFFERENTIAL/PLATELET
BASO%: 0 % (ref 0.0–2.0)
Basophils Absolute: 0 10*3/uL (ref 0.0–0.1)
EOS%: 0.1 % (ref 0.0–7.0)
Eosinophils Absolute: 0.1 10*3/uL (ref 0.0–0.5)
HEMATOCRIT: 37 % (ref 34.8–46.6)
HGB: 11.5 g/dL — ABNORMAL LOW (ref 11.6–15.9)
LYMPH%: 90.7 % — AB (ref 14.0–49.7)
MCH: 28.4 pg (ref 25.1–34.0)
MCHC: 31.1 g/dL — AB (ref 31.5–36.0)
MCV: 91.5 fL (ref 79.5–101.0)
MONO#: 0.3 10*3/uL (ref 0.1–0.9)
MONO%: 0.4 % (ref 0.0–14.0)
NEUT#: 5.6 10*3/uL (ref 1.5–6.5)
NEUT%: 8.8 % — AB (ref 38.4–76.8)
PLATELETS: 304 10*3/uL (ref 145–400)
RBC: 4.04 10*6/uL (ref 3.70–5.45)
RDW: 13.8 % (ref 11.2–14.5)
WBC: 64 10*3/uL (ref 3.9–10.3)
lymph#: 58 10*3/uL — ABNORMAL HIGH (ref 0.9–3.3)

## 2013-12-12 LAB — LACTATE DEHYDROGENASE (CC13): LDH: 258 U/L — ABNORMAL HIGH (ref 125–245)

## 2013-12-12 LAB — TECHNOLOGIST REVIEW

## 2013-12-12 NOTE — Patient Instructions (Signed)
Chronic Lymphocytic Leukemia Chronic lymphocytic leukemia (CLL) is a type of cancer of the bone marrow and blood cells. Bone marrow is the soft, spongy tissue inside your bone. In CLL, the bone marrow makes too many white blood cells that usually fight infection in the body (lymphocytes). CLL usually gets worse slowly and is the most common type of adult leukemia.  RISK FACTORS No one knows the exact cause of CLL. There is a higher risk of CLL in people who:   Are older than 50 years.  Are white.  Are female.  Have a family history of CLL or other cancers of the lymph system.  Are of Russian Jewish or Eastern European Jewish descent.  Have been exposed to certain chemicals, such as Agent Orange (used in the Vietnam War) or other herbicides or insecticides. SYMPTOMS  At first, there may be no symptoms of chronic lymphocytic leukemia. After a while, some symptoms may occur, such as:   Feeling more tired than usual, even after rest.  Unplanned weight loss.  Heavy sweating at night.  Fevers.  Shortness of breath.  Decreased energy.  Paleness.  Painless, swollen lymph nodes.  A feeling of fullness in the upper left part of the abdomen.  Easy bruising or bleeding.  More frequent infections. DIAGNOSIS  Your health care provider may perform the following exams and tests to diagnose CLL:  Physical exam to check for an enlarged spleen, liver, or lymph nodes.  Blood and bone marrow tests to identify the presence of cancer cells. These may include tests such as complete blood count, flow cytometry, immunophenotyping, and fluorescence in situ hybridization (FISH).  CT scan to look for swelling or abnormalities in your spleen, liver, and lymph nodes. TREATMENT  Treatment options for CLL depend on the stage and the presence of symptoms. There are a number of types of treatment used for this condition, including:  Observation.  Targeted drugs. These are drugs that interfere with  chemicals that leukemia cells need in order to grow and multiply. They identify and attack specific cancer cells without harming normal cells.  Chemotherapy drugs. These medicines kill cells that are multiplying quickly, such as leukemia cells.  Radiation.  Surgery to remove the spleen.  Biological therapy. This treatment boosts the ability of your own immune system to fight the leukemia cells.  Bone marrow or peripheral blood stem cell transplant. This treatment allows the patient to receive very high doses of chemotherapy and/or radiation. These high doses kill the cancer cells but also destroy the bone marrow. After treatment is complete, you are given donor bone marrow or stem cells, which will replace the bone marrow. HOME CARE INSTRUCTIONS   Because you have an increased risk of infection, practice good hand washing and avoid being around people who are ill or being in crowded places.  Because you have an increased risk of bleeding and bruising, avoid contact sports or other rough activities.  Only take over-the-counter or prescription medicines for pain, discomfort, or fever as directed by your health care provider.  Although some of your treatments might affect your appetite, try to eat regular, healthy meals.  If you develop any side effects, such as nausea, diarrhea, rash, white patches in your mouth, a sore throat, difficulty swallowing, or severe fatigue, tell your health care provider. He or she may have recommendations of things you can do to improve symptoms.  Consider learning some ways to cope with the stress of having a chronic illness, such as yoga, meditation,   or participating in a support group. SEEK MEDICAL CARE IF:  You develop chest pains.  You notice pain, swelling or redness anywhere in your legs.  You have pain in your belly (abdomen).  You develop new bruises that are getting bigger.  You have painful or more swollen lymph nodes.  You develop bleeding  from your gums, nose, or in your urine or stools.  You are unable to stop throwing up (vomiting).  You cannot keep liquids down.  You feel lightheaded.  You have a fever or persistent symptoms for more than 2 3 days.  You develop a severe stiff neck or headache. SEEK IMMEDIATE MEDICAL CARE IF:  You have trouble breathing or feel short of breath.  You faint. Document Released: 02/08/2009 Document Revised: 05/25/2013 Document Reviewed: 03/17/2013 [facility] Patient Information 2014 [place]. Hypokalemia Hypokalemia means that the amount of potassium in the blood is lower than normal.Potassium is a chemical, called an electrolyte, that helps regulate the amount of fluid in the body. It also stimulates muscle contraction and helps nerves function properly.Most of the body's potassium is inside of cells, and only a very small amount is in the blood. Because the amount in the blood is so small, minor changes can be life-threatening. CAUSES  Antibiotics.  Diarrhea or vomiting.  Using laxatives too much, which can cause diarrhea.  Chronic kidney disease.  Water pills (diuretics).  Eating disorders (bulimia).  Low magnesium level.  Sweating a lot. SIGNS AND SYMPTOMS  Weakness.  Constipation.  Fatigue.  Muscle cramps.  Mental confusion.  Skipped heartbeats or irregular heartbeat (palpitations).  Tingling or numbness. DIAGNOSIS  Your health care provider can diagnose hypokalemia with blood tests. In addition to checking your potassium level, your health care provider may also check other lab tests. TREATMENT Hypokalemia can be treated with potassium supplements taken by mouth or adjustments in your current medicines. If your potassium level is very low, you may need to get potassium through a vein (IV) and be monitored in the hospital. A diet high in potassium is also helpful. Foods high in potassium are:  Nuts, such as peanuts and pistachios.  Seeds, such as  sunflower seeds and pumpkin seeds.  Peas, lentils, and lima beans.  Whole grain and bran cereals and breads.  Fresh fruit and vegetables, such as apricots, avocado, bananas, cantaloupe, kiwi, oranges, tomatoes, asparagus, and potatoes.  Orange and tomato juices.  Red meats.  Fruit yogurt. HOME CARE INSTRUCTIONS  Take all medicines as prescribed by your health care provider.  Maintain a healthy diet by including nutritious food, such as fruits, vegetables, nuts, whole grains, and lean meats.  If you are taking a laxative, be sure to follow the directions on the label. SEEK MEDICAL CARE IF:  Your weakness gets worse.  You feel your heart pounding or racing.  You are vomiting or having diarrhea.  You are diabetic and having trouble keeping your blood glucose in the normal range. SEEK IMMEDIATE MEDICAL CARE IF:  You have chest pain, shortness of breath, or dizziness.  You are vomiting or having diarrhea for more than 2 days.  You faint. MAKE SURE YOU:   Understand these instructions.  Will watch your condition.  Will get help right away if you are not doing well or get worse. Document Released: 09/22/2005 Document Revised: 07/13/2013 Document Reviewed: 03/25/2013 [facility] Patient Information 2014 [place].

## 2013-12-12 NOTE — Telephone Encounter (Signed)
gv pt appt schedule for april/may. [facility] requesting appt w/Dr. Tommie Raymond - pt aware.

## 2013-12-13 NOTE — Progress Notes (Signed)
[place] OFFICE PROGRESS NOTE  [name], MD [facility] [address] [place] [address]  DIAGNOSIS: CLL (chronic lymphocytic leukemia) - Plan: CBC with Differential, CBC with Differential in 2 months, Basic metabolic panel (Bmet) - [facility]  Poor dentition - Plan: Ambulatory referral to Dentistry  Chief Complaint  Patient presents with  . CLL (chronic lymphocytic leukemia)    CURRENT THERAPY: 1. CLL on observation. 2. Iron deficiency anemia due to menstrual bleeding on IV Feraheme when necessary.  INTERVAL HISTORY: Audrey Gross 53 y.o. female iron deficiency anemia, CLL and cystic breast is here for followup. She was last seen by me on 10/17/2013. Today, patient is accompanied by her husband [name].   She saw Dr. Ronnald Ramp on 08/29/2013 for neck pain and then on 09/21/2013 for lymphadenopathy on the right.  She  reports having a CT of neck on 09/26/2013 which demonstrated generalized right neck and thoracic inlet lymphadenopathy compatible with acute CLL or lymphoma with a conglomeration of abnormal level 3 nodes corresponding to the area of clinical concern.  There was also mild left neck and mild to moderate left subclavian lymphadenopathy.  Dr. Ronnald Ramp scheduled her to see [facility] and Thro[facility] (Dr. Adrian Saran).  She saw Dr Constance Holster and she reports he recommended evaluation by dentistry due to multiple dental caries.  She has been lost to follow-up to dentistry.  She reports the size of nodes have also gotten smaller.   She reports her last mammogram was done January 2013. She also endorses increased anxiety when doing mammograms. Denies any infections any fevers or chills, or an acute shortness of breath, any weight changes. She is up-to-date for colonoscopy which was done by Dr. Ardis Hughs on 04/07/2012. She she reports heavy menses with her last menstrual period last week. She denies any [place] or dyspnea on exertion or fatigue or chest pain or palpitations. She  complains of right lower extremity pain that is intermittent in nature.   MEDICAL HISTORY: Past Medical History  Diagnosis Date  . Hypertension   . CLL (chronic lymphoblastic leukemia)   . Depression   . Iron deficiency anemia, unspecified   . Diffuse cystic mastopathy   . Cystic breast   . Systolic ejection murmur   . 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    ALLERGIES:  has No Known Allergies.  MEDICATIONS: has a current medication list which includes the following prescription(s): ibuprofen, olmesartan-amlodipine-hctz, and atorvastatin.  SURGICAL HISTORY:  Past Surgical History  Procedure Laterality Date  . Tubal ligation      REVIEW OF SYSTEMS:   Constitutional: Denies fevers, chills or abnormal weight loss Eyes: Denies blurriness of vision Ears, nose, mouth, throat, and face: Denies mucositis or sore throat Respiratory: Denies cough, dyspnea or wheezes Cardiovascular: Denies palpitation, chest discomfort or lower extremity swelling Gastrointestinal:  Denies nausea, heartburn or change in bowel habits Skin: Denies abnormal skin rashes Lymphatics: Reports lymphadenopathy as noted in HPI but denies easy bruising Neurological:Denies numbness, tingling or new weaknesses Behavioral/Psych: Mood is stable, no new changes  All other systems were reviewed with the patient and are negative.  PHYSICAL EXAMINATION: ECOG PERFORMANCE STATUS: 0 -  Asymptomatic  Blood pressure 149/86, pulse 70, temperature 98.1 F (36.7 C), temperature source Oral, resp. rate 18, height 5\' 7"  (1.702 m), weight 183 lb  3.2 oz (83.099 kg), last menstrual period 07/20/2013.  GENERAL:alert, no distress and comfortable and anxious well-developed well-nourished female, mildly obese. SKIN: skin color, texture, turgor are normal, no rashes or significant lesions EYES: normal, Conjunctiva are pink and non-injected, sclera clear OROPHARYNX:no exudate, no erythema and lips, buccal mucosa, and tongue normal; Poor dentition.  NECK: supple, thyroid fullness, non-tender, without nodularity LYMPH:  no palpable lymphadenopathy in the axillary or supraclavicular; multiple lymphadenopathy in the cervical with R greater than left and largest node less than 1 cm (smaller than prior exam).  LUNGS: clear to auscultation and percussion with normal breathing effort HEART: regular rate & rhythm and mild systolic ejection murmur  and no lower extremity edema ABDOMEN:abdomen soft, non-tender and normal bowel sounds Musculoskeletal:no cyanosis of digits and no clubbing  NEURO: alert & oriented x 3 with fluent speech, no focal motor/sensory deficits   LABORATORY DATA: Results for orders placed in visit on 12/12/13 (from the past 48 hour(s))  COMPREHENSIVE METABOLIC PANEL (CC13)     Status: Abnormal   Collection Time    12/12/13  9:15 AM      Result Value Ref Range   Sodium 142  136 - 145 mEq/L   Potassium 3.3 (*) 3.5 - 5.1 mEq/L   Chloride 105  98 - 109 mEq/L   CO2 26  22 - 29 mEq/L   Glucose 108  70 - 140 mg/dl   BUN 81.0  7.0 - 17.5 mg/dL   Creatinine 0.8  0.6 - 1.1 mg/dL   Total Bilirubin 1.02  0.20 - 1.20 mg/dL   Alkaline Phosphatase 55  40 - 150 U/L   AST 17  5 - 34 U/L   ALT 17  0 - 55 U/L   Total Protein 7.9  6.4 - 8.3 g/dL   Albumin 4.6  3.5 - 5.0 g/dL   Calcium 9.7  8.4 - 58.5 mg/dL   Anion Gap 10  3 - 11 mEq/L  CBC WITH DIFFERENTIAL     Status: Abnormal   Collection Time    12/12/13  9:15 AM      Result Value Ref Range   WBC 64.0 (*) 3.9 - 10.3 10e3/uL   NEUT# 5.6  1.5 - 6.5 10e3/uL   HGB 11.5 (*) 11.6 -  15.9 g/dL   HCT 27.7  82.4 - 23.5 %   Platelets 304  145 - 400 10e3/uL   MCV 91.5  79.5 - 101.0 fL   MCH 28.4  25.1 - 34.0 pg   MCHC 31.1 (*) 31.5 - 36.0 g/dL   RBC 3.61  4.43 - 1.54 10e6/uL   RDW 13.8  11.2 - 14.5 %   lymph# 58.0 (*) 0.9 - 3.3 10e3/uL   MONO# 0.3  0.1 - 0.9 10e3/uL   Eosinophils Absolute 0.1  0.0 - 0.5 10e3/uL   Basophils Absolute 0.0  0.0 - 0.1 10e3/uL   NEUT% 8.8 (*) 38.4 - 76.8 %   LYMPH% 90.7 (*) 14.0 - 49.7 %   MONO% 0.4  0.0 - 14.0 %   EOS% 0.1  0.0 - 7.0 %   BASO% 0.0  0.0 - 2.0 %  LACTATE DEHYDROGENASE (CC13)     Status: Abnormal   Collection Time    12/12/13  9:15 AM      Result Value Ref Range   LDH  258 (*) 125 - 245 U/L  TECHNOLOGIST REVIEW     Status: None   Collection Time    12/12/13  9:15 AM      Result Value Ref Range   Technologist Review Variant lymphs present and moderate smudge cells      Labs:  Lab Results  Component Value Date   WBC 64.0* 12/12/2013   HGB 11.5* 12/12/2013   HCT 37.0 12/12/2013   MCV 91.5 12/12/2013   PLT 304 12/12/2013   NEUTROABS 5.6 12/12/2013      Chemistry      Component Value Date/Time   NA 142 12/12/2013 0915   NA 138 07/26/2013 0934   K 3.3* 12/12/2013 0915   K 3.9 07/26/2013 0934   CL 107 07/26/2013 0934   CL 105 01/18/2013 0912   CO2 26 12/12/2013 0915   CO2 24 07/26/2013 0934   BUN 10.4 12/12/2013 0915   BUN 16 07/26/2013 0934   CREATININE 0.8 12/12/2013 0915   CREATININE 0.8 07/26/2013 0934      Component Value Date/Time   CALCIUM 9.7 12/12/2013 0915   CALCIUM 9.1 07/26/2013 0934   ALKPHOS 55 12/12/2013 0915   ALKPHOS 44 07/26/2013 0934   AST 17 12/12/2013 0915   AST 14 07/26/2013 0934   ALT 17 12/12/2013 0915   ALT 13 07/26/2013 0934   BILITOT 0.72 12/12/2013 0915   BILITOT 0.7 07/26/2013 0934     Basic Metabolic Panel:  Recent Labs Lab 12/12/13 0915  NA 142  K 3.3*  CO2 26  GLUCOSE 108  BUN 10.4  CREATININE 0.8  CALCIUM 9.7   GFR Estimated Creatinine Clearance: 90.1 ml/min (by C-G formula based on  Cr of 0.8). Liver Function Tests:  Recent Labs Lab 12/12/13 0915  AST 17  ALT 17  ALKPHOS 55  BILITOT 0.72  PROT 7.9  ALBUMIN 4.6    CBC:  Recent Labs Lab 12/12/13 0915  WBC 64.0*  NEUTROABS 5.6  HGB 11.5*  HCT 37.0  MCV 91.5  PLT 304    Recent Results (from the past 240 hour(s))  TECHNOLOGIST REVIEW     Status: None   Collection Time    12/12/13  9:15 AM      Result Value Ref Range Status   Technologist Review Variant lymphs present and moderate smudge cells   Final    Iron/TIBC/Ferritin    Component Value Date/Time   IRON 57 10/17/2013 0931   IRON 42 01/18/2013 0912   TIBC 274 10/17/2013 0931   TIBC 259 01/18/2013 0912   FERRITIN 106 10/17/2013 0931   FERRITIN 160 01/18/2013 0912   Studies:  No results found.   RADIOGRAPHIC STUDIES   ASSESSMENT: Audrey Gross 53 y.o. female with a history of CLL (chronic lymphocytic leukemia) - Plan: CBC with Differential, CBC with Differential in 2 months, Basic metabolic panel (Bmet) - [facility]  Poor dentition - Plan: Ambulatory referral to Dentistry  PLAN:  1. CLL, Stage I now with associated lymphadenopathy. --The patient was informed about her rising white count of 64 down from 73.2 on 11/28/2013,  37.5 on 07/25/2013. She denies any night sweats, fevers or chills. As noted above, she reports lymphadenopathy.  Likely secondary to CLL given its general distribution.  We reviewed indications for treatment includes worsening symptoms such as painful lymphadenopathy or recurrent infections and/or weight lost, night sweats.   She was evaluated by ENT who referred her to dentistry where she was last to follow-up.   She needs  to be evaluated prior to treatment.   --Her anemia as detailed #2 is thought to be due to her menses and not related to her CLL. I reviewed extensively the diagnosis of CLL, symptoms of CLL and treatment options. She was also provided a handout detailing CLL.  --Should she require treatment, we would consider  treatment with Obinutuzumab plus chlorambucil [facility], NEJM [id number] December 23, 2012). We will follow her more frequently for symptoms of recurrent infections and worsening thrombocytopenia. Labs will be repeated in one month and follow-up in two montsh. She will require hepatitis b testing prior to starting obinutuzumab. In the above study, chlorambucil was given orally at a dose of 0.5 mg per kilogram of body weight on days 1 and 15 and obinutuzumab was administered IV at a doses of 1000 mg on days, 1, 8, 15 of cycle 1 and on day 1 of cycles 2 through 6. After an amendment to the study obinutuzumab was administered over days.   2. Iron deficiency anemia due to menses. --Her iron deficiency anemia indices were reviewed and revealed a ferritin of 106  From 26 over  3 months ago.  Her iron level was 57 which is normal.  Her hemoglobin was 11.5 stable from 11.4.    She was given  510 mg of IV feraheme on 07/28/2013.  Last visit, she was also provided a handout on iron deficiency anemia.   3. History of breast cysts. --She had a mammogram at [facility] on 10/09/2011 with a cyst aspiration which was not sent for cytology. Reports increased anxiety over having an additional mammogram so therefore she has not gone. We advised her strongly to followup with repeat mammogram this year.  4. Hypertension. -- She reports that her blood pressures at home or usually 140s over 70s. She reports increased anxiety when visiting the doctor's office. She reports compliance to her medications including her antihypertensives.   5. Thyroid fullness.  --This was noted on CT of Neck.   Her last TSH was within normal limits.   6. Followup. --She should followup in 2 months with CBC and chemistries on the day of her next visit.  She was provided after visit summary.She will have interim labs in one month.   All questions were answered. The patient knows to call the clinic with any problems, questions or  concerns. We can certainly see the patient much sooner if necessary.  I spent 15 minutes counseling the patient face to face. The total time spent in the appointment was 25 minutes.    Zaharah Amir, MD 12/13/2013 10:50 PM

## 2013-12-21 ENCOUNTER — Encounter (INDEPENDENT_AMBULATORY_CARE_PROVIDER_SITE_OTHER): Payer: Self-pay

## 2013-12-21 ENCOUNTER — Ambulatory Visit (HOSPITAL_COMMUNITY): Payer: Self-pay | Admitting: Dentistry

## 2013-12-21 ENCOUNTER — Encounter (HOSPITAL_COMMUNITY): Payer: Self-pay | Admitting: Dentistry

## 2013-12-21 VITALS — BP 159/87 | HR 56 | Temp 98.0°F

## 2013-12-21 DIAGNOSIS — K029 Dental caries, unspecified: Secondary | ICD-10-CM

## 2013-12-21 DIAGNOSIS — Z0189 Encounter for other specified special examinations: Secondary | ICD-10-CM

## 2013-12-21 DIAGNOSIS — K083 Retained dental root: Secondary | ICD-10-CM

## 2013-12-21 DIAGNOSIS — K0530 Chronic periodontitis, unspecified: Secondary | ICD-10-CM

## 2013-12-21 DIAGNOSIS — K047 Periapical abscess without sinus: Secondary | ICD-10-CM

## 2013-12-21 DIAGNOSIS — K036 Deposits [accretions] on teeth: Secondary | ICD-10-CM

## 2013-12-21 DIAGNOSIS — M2607 Excessive tuberosity of jaw: Secondary | ICD-10-CM

## 2013-12-21 DIAGNOSIS — C9110 Chronic lymphocytic leukemia of B-cell type not having achieved remission: Secondary | ICD-10-CM

## 2013-12-21 DIAGNOSIS — M270 Developmental disorders of jaws: Secondary | ICD-10-CM

## 2013-12-21 DIAGNOSIS — K001 Supernumerary teeth: Secondary | ICD-10-CM

## 2013-12-21 DIAGNOSIS — K08409 Partial loss of teeth, unspecified cause, unspecified class: Secondary | ICD-10-CM

## 2013-12-21 DIAGNOSIS — K045 Chronic apical periodontitis: Secondary | ICD-10-CM

## 2013-12-21 DIAGNOSIS — M264 Malocclusion, unspecified: Secondary | ICD-10-CM

## 2013-12-21 MED ORDER — AMOXICILLIN 500 MG PO CAPS: ORAL_CAPSULE | ORAL | Status: DC

## 2013-12-21 NOTE — Patient Instructions (Signed)
The patient is currently scheduled for extractions next week. Patient is to take amoxicillin 500 mg every 8 hours for the next 7 days to 2 buccal swelling in the area of #5. Patient will contact dental medicine if she wishes to be referred to an oral surgeon due to her history of dental phobia. The patient also is considering other dental treatment options at this time. Dr. Enrique Sack

## 2013-12-21 NOTE — Progress Notes (Signed)
DENTAL CONSULTATION  Date of Consultation:  12/21/2013 Patient Name:   Audrey Gross Date of Birth:   August 31, 1961 Medical Record Number: [id number]  VITALS: BP 159/87  Pulse 56  Temp(Src) 98 F (36.7 C) (Oral)  LMP 07/20/2013   CHIEF COMPLAINT: The patient was referred by Dr. Concha Norway for evaluation of poor dentition.  HPI: The patient has a history of CLL followed by Dr. Juliann Mule.  Patient with anticipated chemotherapy. Patient is now seen as part of a medically necessary pre-chemotherapy dental protocol evaluation.  Patient with a history of intermittent swelling involving the upper right quadrant. Patient points tooth area #5 as the area. The patient denies having any toothache symptoms, however.  Patient was unable to tell me how long the buccal swelling has been present at this time. The patient has not seen a dentist for " a long time". Patient indicates that is probably been at least 10 years since she has seen a dentist. Patient was unable to provide the name of the dentist. Patient was unable to provide the type of dental treatment she had at that time. The patient has no partial dentures.  PROBLEM LIST: Patient Active Problem List   Diagnosis Date Noted  . Poor dentition 12/12/2013  . Mass of right side of neck 08/29/2013  . Dyslipidemia (high LDL; low HDL) 07/22/2012  . Heart murmur, systolic 40/98/1191  . Osteopenia 06/21/2012  . Routine general medical examination at a health care facility 06/21/2012  . Essential hypertension, benign 06/21/2012  . CLL (chronic lymphocytic leukemia) 01/05/2012  . Iron deficiency anemia 01/05/2012  . Depression 01/05/2012  . 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 Hypertension   . CLL (chronic lymphoblastic leukemia)   . Depression   . Iron deficiency anemia, unspecified   . Diffuse cystic mastopathy   . Cystic breast   . Systolic  ejection murmur   . Medical non-compliance   . Dyslipidemia (high LDL; low HDL)   . Osteopenia     PSH: Past Surgical History  Procedure Laterality Date  . Tubal ligation      ALLERGIES: No Known Allergies  MEDICATIONS: Current Outpatient Prescriptions  Medication Sig Dispense Refill  . atorvastatin (LIPITOR) 40 MG tablet Take 1 tablet (40 mg total) by mouth daily.  90 tablet  3  . Olmesartan-Amlodipine-HCTZ 40-5-12.5 MG TABS Take 1 tablet by mouth daily.  90 tablet  3  . ibuprofen (ADVIL,MOTRIN) 200 MG tablet Take 200 mg by mouth every 6 (six) hours as needed.       No current facility-administered medications for this visit.    LABS: Lab Results  Component Value Date   WBC 64.0* 12/12/2013   HGB 11.5* 12/12/2013   HCT 37.0 12/12/2013   MCV 91.5 12/12/2013   PLT 304 12/12/2013      Component Value Date/Time   NA 142 12/12/2013 0915   NA 138 07/26/2013 0934   K 3.3* 12/12/2013 0915   K 3.9 07/26/2013 0934   CL 107 07/26/2013 0934   CL 105 01/18/2013 0912   CO2 26 12/12/2013 0915   CO2 24 07/26/2013 0934   GLUCOSE 108 12/12/2013 0915   GLUCOSE 90 07/26/2013 0934   GLUCOSE 83 01/18/2013 0912   BUN 10.4 12/12/2013 0915   BUN 16 07/26/2013 0934   CREATININE 0.8 12/12/2013 0915   CREATININE 0.8 07/26/2013 0934   CALCIUM 9.7 12/12/2013 0915  CALCIUM 9.1 07/26/2013 0934   No results found for this basename: INR, PROTIME   No results found for this basename: PTT    SOCIAL HISTORY: History   Social History  . Marital Status: Legally Separated    Spouse Name: N/A    Number of Children: 6  . Years of Education: N/A   Occupational History  . Inspector    Social History Main Topics  . Smoking status: Never Smoker   . Smokeless tobacco: Never Used  . Alcohol Use: No  . Drug Use: No  . Sexual Activity: Yes    Birth Control/ Protection: Surgical   Other Topics Concern  . Not on file   Social History Narrative  . No narrative on file    FAMILY HISTORY: Family History   Problem Relation Age of Onset  . Kidney disease Father   . Stomach cancer Maternal Aunt      REVIEW OF SYSTEMS: Reviewed with patient and included in dental record.   DENTAL HISTORY: CHIEF COMPLAINT: The patient was referred by Dr. Myra Rude for evaluation of poor dentition.  HPI: The patient has a history of CLL followed by Dr. Rosie Fate.  Patient with anticipated chemotherapy. Patient is now seen as part of a medically necessary pre-chemotherapy dental protocol evaluation.  Patient with a history of intermittent swelling involving the upper right quadrant. Patient points tooth area #5 as the area. The patient denies having any toothache symptoms, however.  Patient was unable to tell me how long the buccal swelling has been present at this time. The patient has not seen a dentist for " a long time". Patient indicates that is probably been at least 10 years since she has seen a dentist. Patient was unable to provide the name of the dentist. Patient was unable to provide the type of dental treatment she had at that time. The patient has no partial dentures.  DENTAL EXAMINATION: GENERAL: The patient is a well-developed, well-nourished female in no acute distress. HEAD AND NECK: There is no submandibular lymphadenopathy. The patient denies acute TMJ symptoms. INTRAORAL EXAM: The patient has normal saliva. The patient has a mid palatal torus near the vibrating line. Patient has bilateral mandibular lingual tori. The patient has a buccal swelling in the area of #5. The patient has bilateral maxillary excessive tuberosities. DENTITION: The patient is missing tooth numbers 1, 2, 3, 12, 14, 15, 16, 17, 18, 29, 31, and 32. Tooth #28 is present as a retained root segment. There is a supernumerary premolar in the lower left quadrant mesial to tooth #19. Multiple diastemas are noted. There is evidence of excessive attrition of the maxillary and mandibular anterior teeth. PERIODONTAL: Patient has chronic  periodontitis with plaque and calculus accumulations, selective areas gingival recession and moderate to severe bone loss. Patient has tooth mobility. DENTAL CARIES/SUBOPTIMAL RESTORATIONS: The patient has multiple dental caries as per dental charting form. ENDODONTIC: Patient denies having a history of acute pulpitis symptoms. There is chronic apical periodontitis affecting tooth numbers 5 and 28. CROWN AND BRIDGE: There are no crown restorations. There are no bridge restorations. PROSTHODONTIC: Patient has no partial dentures OCCLUSION: Patient with a poor occlusal scheme secondary to multiple missing teeth, multiple retained root segments, multiple diastemas, supra-eruption and drifting of the unopposed teeth into the edentulous areas, and lack of replacement of missing teeth with dental prostheses.  RADIOGRAPHIC INTERPRETATION: An orthopantogram was taken on 12/21/2013 and supplemented with 11 periapical radiographs and 4 bitewings. There are multiple missing teeth. There  are multiple retained root segments. There is incipient to moderate bone loss. There are multiple areas of periapical pathology and radiolucency associated with tooth numbers 5 and 28. There are multiple diastemas. There is supra-eruption and drifting of the unopposed teeth into the edentulous areas. There is an impacted supernumerary premolar mesial to tooth #19.   ASSESSMENTS: 1. History of CLL with anticipated chemotherapy with Dr. Juliann Mule 2. Pre-chemotherapy dental protocol examination 3. Chronic apical periodontitis 4. Multiple retained root segments 5. Dental caries 6. Chronic periodontitis with bone loss 7. Accretions 8. Selective areas of gingival recession 9. Buccal abscess area #5 10. Excessive attrition 11. Supra-eruption and drifting of the unopposed teeth into the edentulous areas 12. Multiple missing teeth 13. Malocclusion 14. Bilateral mandibular lingual tori 15. 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  Supernumerary impacted premolar mesial to tooth #19 17. 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 Bilateral maxillary excessive tuberosities  PLAN/RECOMMENDATIONS: 1. I discussed the risks, benefits, and complications of various treatment options with the patient in relationship to her medical and dental conditions, anticipated chemotherapy, and chronic lymphocytic leukemia. We discussed various treatment options to include no treatment, multiple extractions with alveoloplasty, pre-prosthetic surgery as indicated, periodontal therapy, dental restorations, root canal therapy, crown and bridge therapy, implant therapy, and replacement of missing teeth as indicated. We also discussed referral to an oral surgeon due to the complexity of the case. We also discussed referral to a general dentist for continuation of overall dental care for the patient. The patient currently wishes to proceed with extraction of tooth numbers 5 and 28 at this time.  The patient has been scheduled for next week, but is considering being referred to an oral surgeon due to to to her history of dental phobia.  Patient was placed on amoxicillin 500 mg every 8 hours for 7 days due to the presence of the buccal swelling #5.  The patient currently was not interested in extraction of other teeth including tooth numbers 4, 13, impacted supernumerary premolar, and tooth #30 as well as bilateral mandibular tori reductions and bilateral maxillary tuberosity reductions. The patient is considering initial periodontal therapy at the time of the initial dental extractions as previously discussed.   2. Discussion of findings with medical team and coordination of future medical and dental care as needed.  I spent 75 minutes face to face with patient and more than 50% of time was spent in counseling and /or coordination of care.   Lenn Cal, DDS

## 2013-12-27 ENCOUNTER — Telehealth (HOSPITAL_COMMUNITY): Payer: Self-pay | Admitting: Dentistry

## 2013-12-27 NOTE — Telephone Encounter (Signed)
12/27/2013  Patient:            Audrey Gross Date of Birth:  1960-11-25 MRN:                [id number]  The patient  has been called multiple times with multiple messages left to have patient return call to discuss dental treatment as scheduled. In light of the patient's dental phobia, it was determined that patient would benefit from referral to an oral surgeon at this time. A message was left on the patient's answering machine today at 10:10 AM, indicating that the appointment for Thursday, 12/29/2048 to 1 PM was going to be canceled. The patient is to call to discuss referral to an oral surgeon for dental extraction procedures. Lenn Cal, DDS

## 2013-12-29 ENCOUNTER — Ambulatory Visit (HOSPITAL_COMMUNITY): Payer: Self-pay | Admitting: Dentistry

## 2014-01-12 ENCOUNTER — Other Ambulatory Visit (HOSPITAL_BASED_OUTPATIENT_CLINIC_OR_DEPARTMENT_OTHER): Payer: BC Managed Care – PPO

## 2014-01-12 DIAGNOSIS — C9110 Chronic lymphocytic leukemia of B-cell type not having achieved remission: Secondary | ICD-10-CM

## 2014-01-12 LAB — CBC WITH DIFFERENTIAL/PLATELET
BASO%: 0.4 % (ref 0.0–2.0)
BASOS ABS: 0.3 10*3/uL — AB (ref 0.0–0.1)
EOS%: 0.1 % (ref 0.0–7.0)
Eosinophils Absolute: 0.1 10*3/uL (ref 0.0–0.5)
HEMATOCRIT: 32 % — AB (ref 34.8–46.6)
HEMOGLOBIN: 9.9 g/dL — AB (ref 11.6–15.9)
LYMPH%: 92.7 % — ABNORMAL HIGH (ref 14.0–49.7)
MCH: 28.5 pg (ref 25.1–34.0)
MCHC: 31 g/dL — ABNORMAL LOW (ref 31.5–36.0)
MCV: 92.1 fL (ref 79.5–101.0)
MONO#: 0.3 10*3/uL (ref 0.1–0.9)
MONO%: 0.4 % (ref 0.0–14.0)
NEUT#: 5 10*3/uL (ref 1.5–6.5)
NEUT%: 6.4 % — AB (ref 38.4–76.8)
Platelets: 319 10*3/uL (ref 145–400)
RBC: 3.48 10*6/uL — ABNORMAL LOW (ref 3.70–5.45)
RDW: 14.1 % (ref 11.2–14.5)
WBC: 78.5 10*3/uL (ref 3.9–10.3)
lymph#: 72.7 10*3/uL — ABNORMAL HIGH (ref 0.9–3.3)

## 2014-01-12 LAB — TECHNOLOGIST REVIEW

## 2014-02-10 ENCOUNTER — Ambulatory Visit: Payer: BC Managed Care – PPO

## 2014-02-10 ENCOUNTER — Other Ambulatory Visit: Payer: BC Managed Care – PPO

## 2014-02-28 ENCOUNTER — Other Ambulatory Visit (INDEPENDENT_AMBULATORY_CARE_PROVIDER_SITE_OTHER): Payer: BC Managed Care – PPO

## 2014-02-28 ENCOUNTER — Encounter: Payer: Self-pay | Admitting: Internal Medicine

## 2014-02-28 ENCOUNTER — Ambulatory Visit (INDEPENDENT_AMBULATORY_CARE_PROVIDER_SITE_OTHER): Payer: BC Managed Care – PPO | Admitting: Internal Medicine

## 2014-02-28 VITALS — BP 142/78 | HR 70 | Temp 98.3°F | Resp 16 | Ht 67.0 in | Wt 184.0 lb

## 2014-02-28 DIAGNOSIS — E876 Hypokalemia: Secondary | ICD-10-CM

## 2014-02-28 DIAGNOSIS — R928 Other abnormal and inconclusive findings on diagnostic imaging of breast: Secondary | ICD-10-CM | POA: Insufficient documentation

## 2014-02-28 DIAGNOSIS — Z1231 Encounter for screening mammogram for malignant neoplasm of breast: Secondary | ICD-10-CM

## 2014-02-28 DIAGNOSIS — I10 Essential (primary) hypertension: Secondary | ICD-10-CM

## 2014-02-28 LAB — BASIC METABOLIC PANEL
BUN: 10 mg/dL (ref 6–23)
CO2: 27 meq/L (ref 19–32)
CREATININE: 0.7 mg/dL (ref 0.4–1.2)
Calcium: 9.3 mg/dL (ref 8.4–10.5)
Chloride: 104 mEq/L (ref 96–112)
GFR: 108.85 mL/min (ref >60.00)
Glucose, Bld: 83 mg/dL (ref 70–99)
POTASSIUM: 3.3 meq/L — AB (ref 3.5–5.1)
Sodium: 138 mEq/L (ref 135–145)

## 2014-02-28 LAB — MAGNESIUM: MAGNESIUM: 1.9 mg/dL (ref 1.5–2.5)

## 2014-02-28 MED ORDER — POTASSIUM CHLORIDE CRYS ER 20 MEQ PO TBCR: 20.0000 meq | EXTENDED_RELEASE_TABLET | Freq: Two times a day (BID) | ORAL | Status: DC

## 2014-02-28 NOTE — Assessment & Plan Note (Signed)
Her BP is well controlled 

## 2014-02-28 NOTE — Assessment & Plan Note (Signed)
This is caused by the HCTZ I have asked her to start a K+ replacement

## 2014-02-28 NOTE — Progress Notes (Signed)
Pre visit review using our clinic review tool, if applicable. No additional management support is needed unless otherwise documented below in the visit note. 

## 2014-02-28 NOTE — Patient Instructions (Signed)
Hypokalemia Hypokalemia means that the amount of potassium in the blood is lower than normal.Potassium is a chemical, called an electrolyte, that helps regulate the amount of fluid in the body. It also stimulates muscle contraction and helps nerves function properly.Most of the body's potassium is inside of cells, and only a very small amount is in the blood. Because the amount in the blood is so small, minor changes can be life-threatening. CAUSES  Antibiotics.  Diarrhea or vomiting.  Using laxatives too much, which can cause diarrhea.  Chronic kidney disease.  Water pills (diuretics).  Eating disorders (bulimia).  Low magnesium level.  Sweating a lot. SIGNS AND SYMPTOMS  Weakness.  Constipation.  Fatigue.  Muscle cramps.  Mental confusion.  Skipped heartbeats or irregular heartbeat (palpitations).  Tingling or numbness. DIAGNOSIS  Your health care provider can diagnose hypokalemia with blood tests. In addition to checking your potassium level, your health care provider may also check other lab tests. TREATMENT Hypokalemia can be treated with potassium supplements taken by mouth or adjustments in your current medicines. If your potassium level is very low, you may need to get potassium through a vein (IV) and be monitored in the hospital. A diet high in potassium is also helpful. Foods high in potassium are:  Nuts, such as peanuts and pistachios.  Seeds, such as sunflower seeds and pumpkin seeds.  Peas, lentils, and lima beans.  Whole grain and bran cereals and breads.  Fresh fruit and vegetables, such as apricots, avocado, bananas, cantaloupe, kiwi, oranges, tomatoes, asparagus, and potatoes.  Orange and tomato juices.  Red meats.  Fruit yogurt. HOME CARE INSTRUCTIONS  Take all medicines as prescribed by your health care provider.  Maintain a healthy diet by including nutritious food, such as fruits, vegetables, nuts, whole grains, and lean meats.  If  you are taking a laxative, be sure to follow the directions on the label. SEEK MEDICAL CARE IF:  Your weakness gets worse.  You feel your heart pounding or racing.  You are vomiting or having diarrhea.  You are diabetic and having trouble keeping your blood glucose in the normal range. SEEK IMMEDIATE MEDICAL CARE IF:  You have chest pain, shortness of breath, or dizziness.  You are vomiting or having diarrhea for more than 2 days.  You faint. MAKE SURE YOU:   Understand these instructions.  Will watch your condition.  Will get help right away if you are not doing well or get worse. Document Released: 09/22/2005 Document Revised: 07/13/2013 Document Reviewed: 03/25/2013 [employer] Patient Information 2014 [employer]  

## 2014-02-28 NOTE — Progress Notes (Signed)
Subjective:    Patient ID: Audrey Gross, female    DOB: [birth year], 53 y.o.   MRN: [id number]  Hypertension This is a chronic problem. The current episode started more than 1 year ago. The problem has been gradually improving since onset. The problem is controlled. Pertinent negatives include no anxiety, blurred vision, chest pain, headaches, malaise/fatigue, neck pain, orthopnea, palpitations, peripheral edema, PND, shortness of breath or sweats. Agents associated with hypertension include NSAIDs. Past treatments include angiotensin blockers, calcium channel blockers and diuretics. The current treatment provides moderate improvement. Compliance problems include exercise and diet.       Review of Systems  Constitutional: Negative.  Negative for fever, chills, malaise/fatigue, diaphoresis, appetite change, fatigue and unexpected weight change.  HENT: Negative.  Negative for facial swelling, sore throat and trouble swallowing.   Eyes: Negative.  Negative for blurred vision.  Respiratory: Negative.  Negative for apnea, cough, choking, chest tightness, shortness of breath, wheezing and stridor.   Cardiovascular: Negative.  Negative for chest pain, palpitations, orthopnea, leg swelling and PND.  Gastrointestinal: Negative.  Negative for nausea, vomiting, abdominal pain, diarrhea, constipation and blood in stool.  Endocrine: Negative.   Genitourinary: Negative.   Musculoskeletal: Negative.  Negative for neck pain.  Skin: Negative.  Negative for rash.  Allergic/Immunologic: Negative.   Neurological: Negative.  Negative for dizziness, weakness, light-headedness and headaches.  Hematological: Negative.  Negative for adenopathy. Does not bruise/bleed easily.  Psychiatric/Behavioral: Negative.        Objective:   Physical Exam  Vitals reviewed. Constitutional: She is oriented to person, place, and time. She appears well-developed and well-nourished.  Non-toxic appearance. She does not have a  sickly appearance. She does not appear ill. No distress.  HENT:  Head: Normocephalic and atraumatic.  Mouth/Throat: Oropharynx is clear and moist. No oropharyngeal exudate.  Eyes: Conjunctivae are normal. Right eye exhibits no discharge. Left eye exhibits no discharge. No scleral icterus.  Neck: Normal range of motion. Neck supple. No JVD present. No tracheal deviation present. No thyromegaly present.  Cardiovascular: Normal rate, regular rhythm, S1 normal, S2 normal and intact distal pulses.  Exam reveals no gallop and no friction rub.   Murmur heard.  Decrescendo systolic murmur is present with a grade of 1/6   No diastolic murmur is present  Pulses:      Carotid pulses are 1+ on the right side, and 1+ on the left side.      Radial pulses are 1+ on the right side, and 1+ on the left side.       Femoral pulses are 1+ on the right side, and 1+ on the left side.      Popliteal pulses are 1+ on the right side, and 1+ on the left side.       Dorsalis pedis pulses are 1+ on the right side, and 1+ on the left side.       Posterior tibial pulses are 1+ on the right side, and 1+ on the left side.  Pulmonary/Chest: Effort normal and breath sounds normal. No stridor. No respiratory distress. She has no wheezes. She has no rales. She exhibits no tenderness.  Abdominal: Soft. Bowel sounds are normal. She exhibits no distension and no mass. There is no tenderness. There is no rebound and no guarding.  Musculoskeletal: Normal range of motion. She exhibits no edema and no tenderness.  Lymphadenopathy:    She has no cervical adenopathy.  Neurological: She is oriented to person, place, and time.  Skin:  Skin is warm and dry. No rash noted. She is not diaphoretic. No erythema. No pallor.  Psychiatric: She has a normal mood and affect. Her behavior is normal. Judgment and thought content normal.     Lab Results  Component Value Date   WBC 78.5* 01/12/2014   HGB 9.9* 01/12/2014   HCT 32.0* 01/12/2014   PLT  319 01/12/2014   GLUCOSE 108 12/12/2013   CHOL 213* 07/26/2013   TRIG 105.0 07/26/2013   HDL 52.90 07/26/2013   LDLDIRECT 144.5 07/26/2013   ALT 17 12/12/2013   AST 17 12/12/2013   NA 142 12/12/2013   K 3.3* 12/12/2013   CL 107 07/26/2013   CREATININE 0.8 12/12/2013   BUN 10.4 12/12/2013   CO2 26 12/12/2013   TSH 0.39 07/26/2013       Assessment & Plan:

## 2014-03-01 ENCOUNTER — Telehealth: Payer: Self-pay | Admitting: Internal Medicine

## 2014-03-01 NOTE — Telephone Encounter (Signed)
Relevant patient education mailed to patient.  

## 2014-03-16 ENCOUNTER — Ambulatory Visit
Admission: RE | Admit: 2014-03-16 | Discharge: 2014-03-16 | Disposition: A | Discharge status: Home / Self Care | Payer: BC Managed Care – PPO | Source: Ambulatory Visit | Attending: Internal Medicine | Admitting: Internal Medicine

## 2014-03-16 DIAGNOSIS — Z1231 Encounter for screening mammogram for malignant neoplasm of breast: Secondary | ICD-10-CM

## 2014-03-23 LAB — HM MAMMOGRAPHY: HM Mammogram: ABNORMAL

## 2014-03-24 ENCOUNTER — Other Ambulatory Visit: Payer: Self-pay | Admitting: Internal Medicine

## 2014-03-24 DIAGNOSIS — R928 Other abnormal and inconclusive findings on diagnostic imaging of breast: Secondary | ICD-10-CM

## 2014-04-12 ENCOUNTER — Ambulatory Visit
Admission: RE | Admit: 2014-04-12 | Discharge: 2014-04-12 | Disposition: A | Discharge status: Home / Self Care | Payer: BC Managed Care – PPO | Source: Ambulatory Visit | Attending: Internal Medicine | Admitting: Internal Medicine

## 2014-04-12 ENCOUNTER — Encounter (INDEPENDENT_AMBULATORY_CARE_PROVIDER_SITE_OTHER): Payer: Self-pay

## 2014-04-12 DIAGNOSIS — R928 Other abnormal and inconclusive findings on diagnostic imaging of breast: Secondary | ICD-10-CM

## 2014-04-12 LAB — HM MAMMOGRAPHY: HM Mammogram: NORMAL

## 2014-05-31 ENCOUNTER — Ambulatory Visit: Payer: Self-pay | Admitting: Internal Medicine

## 2015-03-09 ENCOUNTER — Other Ambulatory Visit: Payer: Self-pay

## 2015-03-29 ENCOUNTER — Other Ambulatory Visit (INDEPENDENT_AMBULATORY_CARE_PROVIDER_SITE_OTHER): Payer: BLUE CROSS/BLUE SHIELD

## 2015-03-29 ENCOUNTER — Ambulatory Visit (INDEPENDENT_AMBULATORY_CARE_PROVIDER_SITE_OTHER): Payer: BLUE CROSS/BLUE SHIELD | Admitting: Internal Medicine

## 2015-03-29 ENCOUNTER — Other Ambulatory Visit: Payer: BLUE CROSS/BLUE SHIELD

## 2015-03-29 ENCOUNTER — Encounter: Payer: Self-pay | Admitting: Internal Medicine

## 2015-03-29 VITALS — BP 180/96 | HR 60 | Temp 98.8°F | Resp 16 | Ht 67.0 in | Wt 181.0 lb

## 2015-03-29 DIAGNOSIS — R928 Other abnormal and inconclusive findings on diagnostic imaging of breast: Secondary | ICD-10-CM | POA: Diagnosis not present

## 2015-03-29 DIAGNOSIS — Z0000 Encounter for general adult medical examination without abnormal findings: Secondary | ICD-10-CM

## 2015-03-29 DIAGNOSIS — N63 Unspecified lump in breast: Secondary | ICD-10-CM

## 2015-03-29 DIAGNOSIS — I10 Essential (primary) hypertension: Secondary | ICD-10-CM

## 2015-03-29 DIAGNOSIS — E876 Hypokalemia: Secondary | ICD-10-CM | POA: Diagnosis not present

## 2015-03-29 DIAGNOSIS — C9110 Chronic lymphocytic leukemia of B-cell type not having achieved remission: Secondary | ICD-10-CM

## 2015-03-29 DIAGNOSIS — N6310 Unspecified lump in the right breast, unspecified quadrant: Secondary | ICD-10-CM | POA: Insufficient documentation

## 2015-03-29 LAB — CBC WITH DIFFERENTIAL/PLATELET
BASOS ABS: 0 10*3/uL (ref 0.0–0.1)
Basophils Relative: 0 % (ref 0–1)
Eosinophils Absolute: 0 10*3/uL (ref 0.0–0.7)
Eosinophils Relative: 0 % (ref 0–5)
HCT: 32.6 % — ABNORMAL LOW (ref 36.0–46.0)
Hemoglobin: 10.4 g/dL — ABNORMAL LOW (ref 12.0–15.0)
LYMPHS PCT: 89 % — AB (ref 12–46)
Lymphs Abs: 210.9 10*3/uL — ABNORMAL HIGH (ref 0.7–4.0)
MCH: 28.6 pg (ref 26.0–34.0)
MCHC: 31.9 g/dL (ref 30.0–36.0)
MCV: 89.6 fL (ref 78.0–100.0)
MONO ABS: 21.3 10*3/uL — AB (ref 0.1–1.0)
MPV: 9.3 fL (ref 8.6–12.4)
Monocytes Relative: 9 % (ref 3–12)
NEUTROS ABS: 4.7 10*3/uL (ref 1.7–7.7)
Neutrophils Relative %: 2 % — ABNORMAL LOW (ref 43–77)
Platelets: 259 10*3/uL (ref 150–400)
RBC: 3.64 MIL/uL — AB (ref 3.87–5.11)
RDW: 16.5 % — ABNORMAL HIGH (ref 11.5–15.5)
WBC: 237 10*3/uL — ABNORMAL HIGH (ref 4.0–10.5)

## 2015-03-29 LAB — COMPREHENSIVE METABOLIC PANEL
ALT: 17 U/L (ref 0–35)
AST: 18 U/L (ref 0–37)
Albumin: 4.4 g/dL (ref 3.5–5.2)
Alkaline Phosphatase: 69 U/L (ref 39–117)
BUN: 9 mg/dL (ref 6–23)
CO2: 28 meq/L (ref 19–32)
Calcium: 9.6 mg/dL (ref 8.4–10.5)
Chloride: 105 mEq/L (ref 96–112)
Creatinine, Ser: 0.81 mg/dL (ref 0.40–1.20)
GFR: 94.63 mL/min (ref >60.00)
Glucose, Bld: 93 mg/dL (ref 70–99)
Potassium: 3.6 mEq/L (ref 3.5–5.1)
Sodium: 139 mEq/L (ref 135–145)
TOTAL PROTEIN: 7.4 g/dL (ref 6.0–8.3)
Total Bilirubin: 0.8 mg/dL (ref 0.2–1.2)

## 2015-03-29 LAB — LIPID PANEL
CHOLESTEROL: 177 mg/dL (ref 0–200)
HDL: 37.4 mg/dL — ABNORMAL LOW (ref >39.00)
LDL Cholesterol: 112 mg/dL — ABNORMAL HIGH (ref 0–99)
NONHDL: 139.6
Total CHOL/HDL Ratio: 5
Triglycerides: 138 mg/dL (ref 0.0–149.0)
VLDL: 27.6 mg/dL (ref 0.0–40.0)

## 2015-03-29 LAB — TSH: TSH: 0.82 u[IU]/mL (ref 0.35–4.50)

## 2015-03-29 MED ORDER — OLMESARTAN-AMLODIPINE-HCTZ 40-5-12.5 MG PO TABS: 1.0000 | ORAL_TABLET | Freq: Every day | ORAL | Status: DC

## 2015-03-29 MED ORDER — POTASSIUM CHLORIDE CRYS ER 20 MEQ PO TBCR: 20.0000 meq | EXTENDED_RELEASE_TABLET | Freq: Two times a day (BID) | ORAL | Status: DC

## 2015-03-29 NOTE — Progress Notes (Signed)
Pre visit review using our clinic review tool, if applicable. No additional management support is needed unless otherwise documented below in the visit note. 

## 2015-03-29 NOTE — Patient Instructions (Signed)
Preventive Care for Adults A healthy lifestyle and preventive care can promote health and wellness. Preventive health guidelines for women include the following key practices.  A routine yearly physical is a good way to check with your health care provider about your health and preventive screening. It is a chance to share any concerns and updates on your health and to receive a thorough exam.  Visit your dentist for a routine exam and preventive care every 6 months. Brush your teeth twice a day and floss once a day. Good oral hygiene prevents tooth decay and gum disease.  The frequency of eye exams is based on your age, health, family medical history, use of contact lenses, and other factors. Follow your health care provider's recommendations for frequency of eye exams.  Eat a healthy diet. Foods like vegetables, fruits, whole grains, low-fat dairy products, and lean protein foods contain the nutrients you need without too many calories. Decrease your intake of foods high in solid fats, added sugars, and salt. Eat the right amount of calories for you.Get information about a proper diet from your health care provider, if necessary.  Regular physical exercise is one of the most important things you can do for your health. Most adults should get at least 150 minutes of moderate-intensity exercise (any activity that increases your heart rate and causes you to sweat) each week. In addition, most adults need muscle-strengthening exercises on 2 or more days a week.  Maintain a healthy weight. The body mass index (BMI) is a screening tool to identify possible weight problems. It provides an estimate of body fat based on height and weight. Your health care provider can find your BMI and can help you achieve or maintain a healthy weight.For adults 20 years and older:  A BMI below 18.5 is considered underweight.  A BMI of 18.5 to 24.9 is normal.  A BMI of 25 to 29.9 is considered overweight.  A BMI of  30 and above is considered obese.  Maintain normal blood lipids and cholesterol levels by exercising and minimizing your intake of saturated fat. Eat a balanced diet with plenty of fruit and vegetables. Blood tests for lipids and cholesterol should begin at age 76 and be repeated every 5 years. If your lipid or cholesterol levels are high, you are over 50, or you are at high risk for heart disease, you may need your cholesterol levels checked more frequently.Ongoing high lipid and cholesterol levels should be treated with medicines if diet and exercise are not working.  If you smoke, find out from your health care provider how to quit. If you do not use tobacco, do not start.  Lung cancer screening is recommended for adults aged 22-80 years who are at high risk for developing lung cancer because of a history of smoking. A yearly low-dose CT scan of the lungs is recommended for people who have at least a 30-pack-year history of smoking and are a current smoker or have quit within the past 15 years. A pack year of smoking is smoking an average of 1 pack of cigarettes a day for 1 year (for example: 1 pack a day for 30 years or 2 packs a day for 15 years). Yearly screening should continue until the smoker has stopped smoking for at least 15 years. Yearly screening should be stopped for people who develop a health problem that would prevent them from having lung cancer treatment.  If you are pregnant, do not drink alcohol. If you are breastfeeding,  be very cautious about drinking alcohol. If you are not pregnant and choose to drink alcohol, do not have more than 1 drink per day. One drink is considered to be 12 ounces (355 mL) of beer, 5 ounces (148 mL) of wine, or 1.5 ounces (44 mL) of liquor.  Avoid use of street drugs. Do not share needles with anyone. Ask for help if you need support or instructions about stopping the use of drugs.  High blood pressure causes heart disease and increases the risk of  stroke. Your blood pressure should be checked at least every 1 to 2 years. Ongoing high blood pressure should be treated with medicines if weight loss and exercise do not work.  If you are 75-52 years old, ask your health care provider if you should take aspirin to prevent strokes.  Diabetes screening involves taking a blood sample to check your fasting blood sugar level. This should be done once every 3 years, after age 15, if you are within normal weight and without risk factors for diabetes. Testing should be considered at a younger age or be carried out more frequently if you are overweight and have at least 1 risk factor for diabetes.  Breast cancer screening is essential preventive care for women. You should practice "breast self-awareness." This means understanding the normal appearance and feel of your breasts and may include breast self-examination. Any changes detected, no matter how small, should be reported to a health care provider. Women in their 58s and 30s should have a clinical breast exam (CBE) by a health care provider as part of a regular health exam every 1 to 3 years. After age 16, women should have a CBE every year. Starting at age 53, women should consider having a mammogram (breast X-ray test) every year. Women who have a family history of breast cancer should talk to their health care provider about genetic screening. Women at a high risk of breast cancer should talk to their health care providers about having an MRI and a mammogram every year.  Breast cancer gene (BRCA)-related cancer risk assessment is recommended for women who have family members with BRCA-related cancers. BRCA-related cancers include breast, ovarian, tubal, and peritoneal cancers. Having family members with these cancers may be associated with an increased risk for harmful changes (mutations) in the breast cancer genes BRCA1 and BRCA2. Results of the assessment will determine the need for genetic counseling and  BRCA1 and BRCA2 testing.  Routine pelvic exams to screen for cancer are no longer recommended for nonpregnant women who are considered low risk for cancer of the pelvic organs (ovaries, uterus, and vagina) and who do not have symptoms. Ask your health care provider if a screening pelvic exam is right for you.  If you have had past treatment for cervical cancer or a condition that could lead to cancer, you need Pap tests and screening for cancer for at least 20 years after your treatment. If Pap tests have been discontinued, your risk factors (such as having a new sexual partner) need to be reassessed to determine if screening should be resumed. Some women have medical problems that increase the chance of getting cervical cancer. In these cases, your health care provider may recommend more frequent screening and Pap tests.  The HPV test is an additional test that may be used for cervical cancer screening. The HPV test looks for the virus that can cause the cell changes on the cervix. The cells collected during the Pap test can be  tested for HPV. The HPV test could be used to screen women aged 30 years and older, and should be used in women of any age who have unclear Pap test results. After the age of 30, women should have HPV testing at the same frequency as a Pap test.  Colorectal cancer can be detected and often prevented. Most routine colorectal cancer screening begins at the age of 50 years and continues through age 75 years. However, your health care provider may recommend screening at an earlier age if you have risk factors for colon cancer. On a yearly basis, your health care provider may provide home test kits to check for hidden blood in the stool. Use of a small camera at the end of a tube, to directly examine the colon (sigmoidoscopy or colonoscopy), can detect the earliest forms of colorectal cancer. Talk to your health care provider about this at age 50, when routine screening begins. Direct  exam of the colon should be repeated every 5-10 years through age 75 years, unless early forms of pre-cancerous polyps or small growths are found.  People who are at an increased risk for hepatitis B should be screened for this virus. You are considered at high risk for hepatitis B if:  You were born in a country where hepatitis B occurs often. Talk with your health care provider about which countries are considered high risk.  Your parents were born in a high-risk country and you have not received a shot to protect against hepatitis B (hepatitis B vaccine).  You have HIV or AIDS.  You use needles to inject street drugs.  You live with, or have sex with, someone who has hepatitis B.  You get hemodialysis treatment.  You take certain medicines for conditions like cancer, organ transplantation, and autoimmune conditions.  Hepatitis C blood testing is recommended for all people born from 1945 through 1965 and any individual with known risks for hepatitis C.  Practice safe sex. Use condoms and avoid high-risk sexual practices to reduce the spread of sexually transmitted infections (STIs). STIs include gonorrhea, chlamydia, syphilis, trichomonas, herpes, HPV, and human immunodeficiency virus (HIV). Herpes, HIV, and HPV are viral illnesses that have no cure. They can result in disability, cancer, and death.  You should be screened for sexually transmitted illnesses (STIs) including gonorrhea and chlamydia if:  You are sexually active and are younger than 24 years.  You are older than 24 years and your health care provider tells you that you are at risk for this type of infection.  Your sexual activity has changed since you were last screened and you are at an increased risk for chlamydia or gonorrhea. Ask your health care provider if you are at risk.  If you are at risk of being infected with HIV, it is recommended that you take a prescription medicine daily to prevent HIV infection. This is  called preexposure prophylaxis (PrEP). You are considered at risk if:  You are a heterosexual woman, are sexually active, and are at increased risk for HIV infection.  You take drugs by injection.  You are sexually active with a partner who has HIV.  Talk with your health care provider about whether you are at high risk of being infected with HIV. If you choose to begin PrEP, you should first be tested for HIV. You should then be tested every 3 months for as long as you are taking PrEP.  Osteoporosis is a disease in which the bones lose minerals and strength   with aging. This can result in serious bone fractures or breaks. The risk of osteoporosis can be identified using a bone density scan. Women ages 65 years and over and women at risk for fractures or osteoporosis should discuss screening with their health care providers. Ask your health care provider whether you should take a calcium supplement or vitamin D to reduce the rate of osteoporosis.  Menopause can be associated with physical symptoms and risks. Hormone replacement therapy is available to decrease symptoms and risks. You should talk to your health care provider about whether hormone replacement therapy is right for you.  Use sunscreen. Apply sunscreen liberally and repeatedly throughout the day. You should seek shade when your shadow is shorter than you. Protect yourself by wearing long sleeves, pants, a wide-brimmed hat, and sunglasses year round, whenever you are outdoors.  Once a month, do a whole body skin exam, using a mirror to look at the skin on your back. Tell your health care provider of new moles, moles that have irregular borders, moles that are larger than a pencil eraser, or moles that have changed in shape or color.  Stay current with required vaccines (immunizations).  Influenza vaccine. All adults should be immunized every year.  Tetanus, diphtheria, and acellular pertussis (Td, Tdap) vaccine. Pregnant women should  receive 1 dose of Tdap vaccine during each pregnancy. The dose should be obtained regardless of the length of time since the last dose. Immunization is preferred during the 27th-36th week of gestation. An adult who has not previously received Tdap or who does not know her vaccine status should receive 1 dose of Tdap. This initial dose should be followed by tetanus and diphtheria toxoids (Td) booster doses every 10 years. Adults with an unknown or incomplete history of completing a 3-dose immunization series with Td-containing vaccines should begin or complete a primary immunization series including a Tdap dose. Adults should receive a Td booster every 10 years.  Varicella vaccine. An adult without evidence of immunity to varicella should receive 2 doses or a second dose if she has previously received 1 dose. Pregnant females who do not have evidence of immunity should receive the first dose after pregnancy. This first dose should be obtained before leaving the health care facility. The second dose should be obtained 4-8 weeks after the first dose.  Human papillomavirus (HPV) vaccine. Females aged 13-26 years who have not received the vaccine previously should obtain the 3-dose series. The vaccine is not recommended for use in pregnant females. However, pregnancy testing is not needed before receiving a dose. If a female is found to be pregnant after receiving a dose, no treatment is needed. In that case, the remaining doses should be delayed until after the pregnancy. Immunization is recommended for any person with an immunocompromised condition through the age of 26 years if she did not get any or all doses earlier. During the 3-dose series, the second dose should be obtained 4-8 weeks after the first dose. The third dose should be obtained 24 weeks after the first dose and 16 weeks after the second dose.  Zoster vaccine. One dose is recommended for adults aged 60 years or older unless certain conditions are  present.  Measles, mumps, and rubella (MMR) vaccine. Adults born before 1957 generally are considered immune to measles and mumps. Adults born in 1957 or later should have 1 or more doses of MMR vaccine unless there is a contraindication to the vaccine or there is laboratory evidence of immunity to   each of the three diseases. A routine second dose of MMR vaccine should be obtained at least 28 days after the first dose for students attending postsecondary schools, health care workers, or international travelers. People who received inactivated measles vaccine or an unknown type of measles vaccine during 1963-1967 should receive 2 doses of MMR vaccine. People who received inactivated mumps vaccine or an unknown type of mumps vaccine before 1979 and are at high risk for mumps infection should consider immunization with 2 doses of MMR vaccine. For females of childbearing age, rubella immunity should be determined. If there is no evidence of immunity, females who are not pregnant should be vaccinated. If there is no evidence of immunity, females who are pregnant should delay immunization until after pregnancy. Unvaccinated health care workers born before 1957 who lack laboratory evidence of measles, mumps, or rubella immunity or laboratory confirmation of disease should consider measles and mumps immunization with 2 doses of MMR vaccine or rubella immunization with 1 dose of MMR vaccine.  Pneumococcal 13-valent conjugate (PCV13) vaccine. When indicated, a person who is uncertain of her immunization history and has no record of immunization should receive the PCV13 vaccine. An adult aged 19 years or older who has certain medical conditions and has not been previously immunized should receive 1 dose of PCV13 vaccine. This PCV13 should be followed with a dose of pneumococcal polysaccharide (PPSV23) vaccine. The PPSV23 vaccine dose should be obtained at least 8 weeks after the dose of PCV13 vaccine. An adult aged 19  years or older who has certain medical conditions and previously received 1 or more doses of PPSV23 vaccine should receive 1 dose of PCV13. The PCV13 vaccine dose should be obtained 1 or more years after the last PPSV23 vaccine dose.  Pneumococcal polysaccharide (PPSV23) vaccine. When PCV13 is also indicated, PCV13 should be obtained first. All adults aged 65 years and older should be immunized. An adult younger than age 65 years who has certain medical conditions should be immunized. Any person who resides in a nursing home or long-term care facility should be immunized. An adult smoker should be immunized. People with an immunocompromised condition and certain other conditions should receive both PCV13 and PPSV23 vaccines. People with human immunodeficiency virus (HIV) infection should be immunized as soon as possible after diagnosis. Immunization during chemotherapy or radiation therapy should be avoided. Routine use of PPSV23 vaccine is not recommended for American Indians, [place] Natives, or people younger than 65 years unless there are medical conditions that require PPSV23 vaccine. When indicated, people who have unknown immunization and have no record of immunization should receive PPSV23 vaccine. One-time revaccination 5 years after the first dose of PPSV23 is recommended for people aged 19-64 years who have chronic kidney failure, nephrotic syndrome, asplenia, or immunocompromised conditions. People who received 1-2 doses of PPSV23 before age 65 years should receive another dose of PPSV23 vaccine at age 65 years or later if at least 5 years have passed since the previous dose. Doses of PPSV23 are not needed for people immunized with PPSV23 at or after age 65 years.  Meningococcal vaccine. Adults with asplenia or persistent complement component deficiencies should receive 2 doses of quadrivalent meningococcal conjugate (MenACWY-D) vaccine. The doses should be obtained at least 2 months apart.  Microbiologists working with certain meningococcal bacteria, military recruits, people at risk during an outbreak, and people who travel to or live in countries with a high rate of meningitis should be immunized. A first-year college student up through age   21 years who is living in a residence hall should receive a dose if she did not receive a dose on or after her 16th birthday. Adults who have certain high-risk conditions should receive one or more doses of vaccine.  Hepatitis A vaccine. Adults who wish to be protected from this disease, have certain high-risk conditions, work with hepatitis A-infected animals, work in hepatitis A research labs, or travel to or work in countries with a high rate of hepatitis A should be immunized. Adults who were previously unvaccinated and who anticipate close contact with an international adoptee during the first 60 days after arrival in the [place] from a country with a high rate of hepatitis A should be immunized.  Hepatitis B vaccine. Adults who wish to be protected from this disease, have certain high-risk conditions, may be exposed to blood or other infectious body fluids, are household contacts or sex partners of hepatitis B positive people, are clients or workers in certain care facilities, or travel to or work in countries with a high rate of hepatitis B should be immunized.  Haemophilus influenzae type b (Hib) vaccine. A previously unvaccinated person with asplenia or sickle cell disease or having a scheduled splenectomy should receive 1 dose of Hib vaccine. Regardless of previous immunization, a recipient of a hematopoietic stem cell transplant should receive a 3-dose series 6-12 months after her successful transplant. Hib vaccine is not recommended for adults with HIV infection. Preventive Services / Frequency Ages 64 to 68 years  Blood pressure check.** / Every 1 to 2 years.  Lipid and cholesterol check.** / Every 5 years beginning at age  22.  Clinical breast exam.** / Every 3 years for women in their 88s and 53s.  BRCA-related cancer risk assessment.** / For women who have family members with a BRCA-related cancer (breast, ovarian, tubal, or peritoneal cancers).  Pap test.** / Every 2 years from ages 90 through 51. Every 3 years starting at age 21 through age 56 or 3 with a history of 3 consecutive normal Pap tests.  HPV screening.** / Every 3 years from ages 24 through ages 1 to 46 with a history of 3 consecutive normal Pap tests.  Hepatitis C blood test.** / For any individual with known risks for hepatitis C.  Skin self-exam. / Monthly.  Influenza vaccine. / Every year.  Tetanus, diphtheria, and acellular pertussis (Tdap, Td) vaccine.** / Consult your health care provider. Pregnant women should receive 1 dose of Tdap vaccine during each pregnancy. 1 dose of Td every 10 years.  Varicella vaccine.** / Consult your health care provider. Pregnant females who do not have evidence of immunity should receive the first dose after pregnancy.  HPV vaccine. / 3 doses over 6 months, if 72 and younger. The vaccine is not recommended for use in pregnant females. However, pregnancy testing is not needed before receiving a dose.  Measles, mumps, rubella (MMR) vaccine.** / You need at least 1 dose of MMR if you were born in 1957 or later. You may also need a 2nd dose. For females of childbearing age, rubella immunity should be determined. If there is no evidence of immunity, females who are not pregnant should be vaccinated. If there is no evidence of immunity, females who are pregnant should delay immunization until after pregnancy.  Pneumococcal 13-valent conjugate (PCV13) vaccine.** / Consult your health care provider.  Pneumococcal polysaccharide (PPSV23) vaccine.** / 1 to 2 doses if you smoke cigarettes or if you have certain conditions.  Meningococcal vaccine.** /  1 dose if you are age 19 to 21 years and a first-year college  student living in a residence hall, or have one of several medical conditions, you need to get vaccinated against meningococcal disease. You may also need additional booster doses.  Hepatitis A vaccine.** / Consult your health care provider.  Hepatitis B vaccine.** / Consult your health care provider.  Haemophilus influenzae type b (Hib) vaccine.** / Consult your health care provider. Ages 40 to 64 years  Blood pressure check.** / Every 1 to 2 years.  Lipid and cholesterol check.** / Every 5 years beginning at age 20 years.  Lung cancer screening. / Every year if you are aged 55-80 years and have a 30-pack-year history of smoking and currently smoke or have quit within the past 15 years. Yearly screening is stopped once you have quit smoking for at least 15 years or develop a health problem that would prevent you from having lung cancer treatment.  Clinical breast exam.** / Every year after age 40 years.  BRCA-related cancer risk assessment.** / For women who have family members with a BRCA-related cancer (breast, ovarian, tubal, or peritoneal cancers).  Mammogram.** / Every year beginning at age 40 years and continuing for as long as you are in good health. Consult with your health care provider.  Pap test.** / Every 3 years starting at age 30 years through age 65 or 70 years with a history of 3 consecutive normal Pap tests.  HPV screening.** / Every 3 years from ages 30 years through ages 65 to 70 years with a history of 3 consecutive normal Pap tests.  Fecal occult blood test (FOBT) of stool. / Every year beginning at age 50 years and continuing until age 75 years. You may not need to do this test if you get a colonoscopy every 10 years.  Flexible sigmoidoscopy or colonoscopy.** / Every 5 years for a flexible sigmoidoscopy or every 10 years for a colonoscopy beginning at age 50 years and continuing until age 75 years.  Hepatitis C blood test.** / For all people born from 1945 through  1965 and any individual with known risks for hepatitis C.  Skin self-exam. / Monthly.  Influenza vaccine. / Every year.  Tetanus, diphtheria, and acellular pertussis (Tdap/Td) vaccine.** / Consult your health care provider. Pregnant women should receive 1 dose of Tdap vaccine during each pregnancy. 1 dose of Td every 10 years.  Varicella vaccine.** / Consult your health care provider. Pregnant females who do not have evidence of immunity should receive the first dose after pregnancy.  Zoster vaccine.** / 1 dose for adults aged 60 years or older.  Measles, mumps, rubella (MMR) vaccine.** / You need at least 1 dose of MMR if you were born in 1957 or later. You may also need a 2nd dose. For females of childbearing age, rubella immunity should be determined. If there is no evidence of immunity, females who are not pregnant should be vaccinated. If there is no evidence of immunity, females who are pregnant should delay immunization until after pregnancy.  Pneumococcal 13-valent conjugate (PCV13) vaccine.** / Consult your health care provider.  Pneumococcal polysaccharide (PPSV23) vaccine.** / 1 to 2 doses if you smoke cigarettes or if you have certain conditions.  Meningococcal vaccine.** / Consult your health care provider.  Hepatitis A vaccine.** / Consult your health care provider.  Hepatitis B vaccine.** / Consult your health care provider.  Haemophilus influenzae type b (Hib) vaccine.** / Consult your health care provider. Ages 65   years and over  Blood pressure check.** / Every 1 to 2 years.  Lipid and cholesterol check.** / Every 5 years beginning at age 22 years.  Lung cancer screening. / Every year if you are aged 73-80 years and have a 30-pack-year history of smoking and currently smoke or have quit within the past 15 years. Yearly screening is stopped once you have quit smoking for at least 15 years or develop a health problem that would prevent you from having lung cancer  treatment.  Clinical breast exam.** / Every year after age 4 years.  BRCA-related cancer risk assessment.** / For women who have family members with a BRCA-related cancer (breast, ovarian, tubal, or peritoneal cancers).  Mammogram.** / Every year beginning at age 40 years and continuing for as long as you are in good health. Consult with your health care provider.  Pap test.** / Every 3 years starting at age 9 years through age 34 or 91 years with 3 consecutive normal Pap tests. Testing can be stopped between 65 and 70 years with 3 consecutive normal Pap tests and no abnormal Pap or HPV tests in the past 10 years.  HPV screening.** / Every 3 years from ages 57 years through ages 64 or 45 years with a history of 3 consecutive normal Pap tests. Testing can be stopped between 65 and 70 years with 3 consecutive normal Pap tests and no abnormal Pap or HPV tests in the past 10 years.  Fecal occult blood test (FOBT) of stool. / Every year beginning at age 15 years and continuing until age 17 years. You may not need to do this test if you get a colonoscopy every 10 years.  Flexible sigmoidoscopy or colonoscopy.** / Every 5 years for a flexible sigmoidoscopy or every 10 years for a colonoscopy beginning at age 86 years and continuing until age 71 years.  Hepatitis C blood test.** / For all people born from [birth year] through 1965 and any individual with known risks for hepatitis C.  Osteoporosis screening.** / A one-time screening for women ages 83 years and over and women at risk for fractures or osteoporosis.  Skin self-exam. / Monthly.  Influenza vaccine. / Every year.  Tetanus, diphtheria, and acellular pertussis (Tdap/Td) vaccine.** / 1 dose of Td every 10 years.  Varicella vaccine.** / Consult your health care provider.  Zoster vaccine.** / 1 dose for adults aged 61 years or older.  Pneumococcal 13-valent conjugate (PCV13) vaccine.** / Consult your health care provider.  Pneumococcal  polysaccharide (PPSV23) vaccine.** / 1 dose for all adults aged 28 years and older.  Meningococcal vaccine.** / Consult your health care provider.  Hepatitis A vaccine.** / Consult your health care provider.  Hepatitis B vaccine.** / Consult your health care provider.  Haemophilus influenzae type b (Hib) vaccine.** / Consult your health care provider. ** Family history and personal history of risk and conditions may change your health care provider's recommendations. Document Released: 11/18/2001 Document Revised: 02/06/2014 Document Reviewed: 02/17/2011 [facility] Patient Information 2015 [place], [place]. This information is not intended to replace advice given to you by your health care provider. Make sure you discuss any questions you have with your health care provider.

## 2015-03-29 NOTE — Progress Notes (Signed)
Subjective:  Patient ID: Audrey Gross, female    DOB: 1960/10/07  Age: 54 y.o. MRN: [id number]  CC: Hypertension; Hyperlipidemia; and Annual Exam   HPI Audrey Gross presents for a CPX but she also complains of an enlarging mass in her right breast over the last 1-2 months.  Outpatient Prescriptions Prior to Visit  Medication Sig Dispense Refill  . Olmesartan-Amlodipine-HCTZ 40-5-12.5 MG TABS Take 1 tablet by mouth daily. 90 tablet 3  . potassium chloride SA (K-DUR,KLOR-CON) 20 MEQ tablet Take 1 tablet (20 mEq total) by mouth 2 (two) times daily. 60 tablet 5   No facility-administered medications prior to visit.    ROS Review of Systems  Constitutional: Negative.  Negative for fever, chills, diaphoresis, appetite change and fatigue.  HENT: Negative.   Eyes: Negative.   Respiratory: Negative.  Negative for apnea, cough, choking, chest tightness, shortness of breath, wheezing and stridor.   Cardiovascular: Negative.  Negative for chest pain, palpitations and leg swelling.  Gastrointestinal: Negative.  Negative for nausea, vomiting, abdominal pain, diarrhea, constipation and blood in stool.  Endocrine: Negative.   Genitourinary: Negative.  Negative for difficulty urinating.  Musculoskeletal: Negative.   Skin: Negative.  Negative for rash.  Allergic/Immunologic: Negative.   Neurological: Negative.  Negative for dizziness, tremors, syncope, light-headedness, numbness and headaches.  Hematological: Negative.  Negative for adenopathy. Does not bruise/bleed easily.  Psychiatric/Behavioral: Negative.     Objective:  BP 180/96 mmHg  Pulse 60  Temp(Src) 98.8 F (37.1 C) (Oral)  Resp 16  Ht 5\' 7"  (1.702 m)  Wt 181 lb (82.101 kg)  BMI 28.34 kg/m2  SpO2 99%  LMP 07/20/2013  BP Readings from Last 3 Encounters:  03/29/15 180/96  02/28/14 142/78  12/21/13 159/87    Wt Readings from Last 3 Encounters:  03/29/15 181 lb (82.101 kg)  02/28/14 184 lb (83.462 kg)  12/12/13 183 lb  3.2 oz (83.099 kg)    Physical Exam  Constitutional: She is oriented to person, place, and time. She appears well-developed and well-nourished. No distress.  HENT:  Head: Normocephalic and atraumatic.  Mouth/Throat: Oropharynx is clear and moist. No oropharyngeal exudate.  Eyes: Conjunctivae are normal. Right eye exhibits no discharge. Left eye exhibits no discharge. No scleral icterus.  Neck: Normal range of motion. Neck supple. No JVD present. No tracheal deviation present. No thyromegaly present.  Cardiovascular: Normal rate, regular rhythm, normal heart sounds and intact distal pulses.  Exam reveals no gallop and no friction rub.   No murmur heard. Pulmonary/Chest: Effort normal and breath sounds normal. No stridor. No respiratory distress. She has no wheezes. She has no rales. Chest wall is not dull to percussion. She exhibits no mass, no tenderness, no bony tenderness, no laceration, no crepitus, no edema, no deformity, no swelling and no retraction. Right breast exhibits mass. Right breast exhibits no inverted nipple, no nipple discharge, no skin change and no tenderness. Left breast exhibits no inverted nipple, no nipple discharge, no skin change and no tenderness. Breasts are asymmetrical.    Abdominal: Soft. Bowel sounds are normal. She exhibits no distension and no mass. There is no tenderness. There is no rebound and no guarding.  Musculoskeletal: Normal range of motion. She exhibits no edema or tenderness.  Lymphadenopathy:    She has no cervical adenopathy.    She has axillary adenopathy.       Right axillary: Lateral adenopathy present. No pectoral adenopathy present.       Left axillary: No pectoral and  no lateral adenopathy present. Rt axilla reveals a firm, round, mobile lymph node that measures 2 X 3 CM's  Neurological: She is oriented to person, place, and time.  Skin: Skin is warm and dry. No rash noted. She is not diaphoretic. No erythema. No pallor.  Psychiatric: She  has a normal mood and affect. Her behavior is normal. Judgment and thought content normal.  Vitals reviewed.   Lab Results  Component Value Date   WBC 78.5* 01/12/2014   HGB 9.9* 01/12/2014   HCT 32.0* 01/12/2014   PLT 319 01/12/2014   GLUCOSE 83 02/28/2014   CHOL 213* 07/26/2013   TRIG 105.0 07/26/2013   HDL 52.90 07/26/2013   LDLDIRECT 144.5 07/26/2013   ALT 17 12/12/2013   AST 17 12/12/2013   NA 138 02/28/2014   K 3.3* 02/28/2014   CL 104 02/28/2014   CREATININE 0.7 02/28/2014   BUN 10 02/28/2014   CO2 27 02/28/2014   TSH 0.39 07/26/2013    Mm Digital Diagnostic Unilat R  04/12/2014   CLINICAL DATA:  Possible right breast asymmetry recent screening mammography. History of chronic lymphocytic leukemia.  EXAM: DIGITAL DIAGNOSTIC  RIGHT MAMMOGRAM  ULTRASOUND RIGHT BREAST  COMPARISON:  Previous examinations, including the screening mammogram dated 03/16/2014 and previous mammogram, ultrasound and cyst aspirations at [facility].  ACR Breast Density Category c: The breast tissue is heterogeneously dense, which may obscure small masses.  FINDINGS: A spot compression view of the superior aspect of the right breast demonstrates normal appearing fibroglandular tissue at the location of the recently suspected asymmetry. A spot compression view of the deep outer right breast demonstrates a persistent obscured oval density with tiny punctate, rounded microcalcifications.  On physical exam, no mass is palpable in the upper or outer right breast.  Ultrasound is performed, showing multiple cysts in the upper and outer right breast. The largest is in the 12 o'clock position, 4 cm from the nipple, measuring 3.2 cm in maximum diameter. No solid masses were seen.  IMPRESSION: Right breast cysts.  No evidence of malignancy.  RECOMMENDATION: Bilateral screening mammogram in 1 year.  I have discussed the findings and recommendations with the patient. Results were also provided in writing at the  conclusion of the visit. If applicable, a reminder letter will be sent to the patient regarding the next appointment.  BI-RADS CATEGORY  2: Benign.   Electronically Signed   By: Enrique Sack M.D.   On: 04/12/2014 09:15   US Breast Ltd Uni Right Inc Axilla  04/12/2014   CLINICAL DATA:  Possible right breast asymmetry recent screening mammography. History of chronic lymphocytic leukemia.  EXAM: DIGITAL DIAGNOSTIC  RIGHT MAMMOGRAM  ULTRASOUND RIGHT BREAST  COMPARISON:  Previous examinations, including the screening mammogram dated 03/16/2014 and previous mammogram, ultrasound and cyst aspirations at [facility].  ACR Breast Density Category c: The breast tissue is heterogeneously dense, which may obscure small masses.  FINDINGS: A spot compression view of the superior aspect of the right breast demonstrates normal appearing fibroglandular tissue at the location of the recently suspected asymmetry. A spot compression view of the deep outer right breast demonstrates a persistent obscured oval density with tiny punctate, rounded microcalcifications.  On physical exam, no mass is palpable in the upper or outer right breast.  Ultrasound is performed, showing multiple cysts in the upper and outer right breast. The largest is in the 12 o'clock position, 4 cm from the nipple, measuring 3.2 cm in maximum diameter. No solid  masses were seen.  IMPRESSION: Right breast cysts.  No evidence of malignancy.  RECOMMENDATION: Bilateral screening mammogram in 1 year.  I have discussed the findings and recommendations with the patient. Results were also provided in writing at the conclusion of the visit. If applicable, a reminder letter will be sent to the patient regarding the next appointment.  BI-RADS CATEGORY  2: Benign.   Electronically Signed   By: Enrique Sack M.D.   On: 04/12/2014 09:15    Assessment & Plan:   Audrey Gross was seen today for hypertension, hyperlipidemia and annual exam.  Diagnoses and all orders for this  visit:  Routine general medical examination at a health care facility - exam done, vaccines were reviewed, labs ordered, pt ed material was given Orders: -     Lipid panel; Future -     Comprehensive metabolic panel; Future -     CBC with Differential/Platelet; Future -     TSH; Future  Abnormal mammogram of right breast -  Orders: -     MM Digital Diagnostic Bilat; Future  Essential hypertension, benign - she is not compliant with the tribenzor and her BP is not well controlled, will restart, will check her labs today to screen for end organ damage and secondary causes of HTN Orders: -     Olmesartan-Amlodipine-HCTZ 40-5-12.5 MG TABS; Take 1 tablet by mouth daily. -     potassium chloride SA (K-DUR,KLOR-CON) 20 MEQ tablet; Take 1 tablet (20 mEq total) by mouth 2 (two) times daily.  Hypokalemia Orders: -     potassium chloride SA (K-DUR,KLOR-CON) 20 MEQ tablet; Take 1 tablet (20 mEq total) by mouth 2 (two) times daily.  Breast mass, right - she is concerned that this is a cyst and wants to consider having it drained Orders: -     Ambulatory referral to General Surgery   I am having Audrey Gross maintain her Olmesartan-Amlodipine-HCTZ and potassium chloride SA.  Meds ordered this encounter  Medications  . Olmesartan-Amlodipine-HCTZ 40-5-12.5 MG TABS    Sig: Take 1 tablet by mouth daily.    Dispense:  90 tablet    Refill:  3  . potassium chloride SA (K-DUR,KLOR-CON) 20 MEQ tablet    Sig: Take 1 tablet (20 mEq total) by mouth 2 (two) times daily.    Dispense:  60 tablet    Refill:  5     Follow-up: No Follow-up on file.  [name], MD

## 2015-03-30 ENCOUNTER — Telehealth: Payer: Self-pay | Admitting: Hematology and Oncology

## 2015-03-30 LAB — PATHOLOGIST SMEAR REVIEW

## 2015-03-30 NOTE — Telephone Encounter (Signed)
NEW PATIENT APPT-S/W PATIENT AND GAVE NP APPT FOR 07/05 @ 1:30 W/DR. [place]

## 2015-03-31 NOTE — Assessment & Plan Note (Signed)
Her WBC is up to 237 I spoke to Dr. Julien Nordmann today and he tells me that his WBC elevation does not require any urgent intervention but she does need to start treatment for the CLL soon, she has an appt on 07/05 with oncology

## 2015-04-02 ENCOUNTER — Telehealth: Payer: Self-pay | Admitting: Hematology and Oncology

## 2015-04-02 NOTE — Telephone Encounter (Signed)
called patient and gave new d/t for 06/30 @ 2 w/Dr. Alvy Bimler

## 2015-04-05 ENCOUNTER — Telehealth: Payer: Self-pay | Admitting: Hematology and Oncology

## 2015-04-05 ENCOUNTER — Ambulatory Visit: Payer: BLUE CROSS/BLUE SHIELD

## 2015-04-05 ENCOUNTER — Ambulatory Visit (HOSPITAL_BASED_OUTPATIENT_CLINIC_OR_DEPARTMENT_OTHER): Payer: BLUE CROSS/BLUE SHIELD | Admitting: Hematology and Oncology

## 2015-04-05 ENCOUNTER — Encounter: Payer: Self-pay | Admitting: Hematology and Oncology

## 2015-04-05 ENCOUNTER — Ambulatory Visit (HOSPITAL_BASED_OUTPATIENT_CLINIC_OR_DEPARTMENT_OTHER): Payer: BLUE CROSS/BLUE SHIELD

## 2015-04-05 ENCOUNTER — Other Ambulatory Visit (HOSPITAL_COMMUNITY)
Admission: RE | Admit: 2015-04-05 | Discharge: 2015-04-05 | Disposition: A | Discharge status: Home / Self Care | Payer: BLUE CROSS/BLUE SHIELD | Source: Ambulatory Visit | Attending: Hematology and Oncology | Admitting: Hematology and Oncology

## 2015-04-05 VITALS — BP 189/87 | HR 52 | Temp 98.1°F | Resp 18 | Ht 67.0 in | Wt 178.0 lb

## 2015-04-05 DIAGNOSIS — N6310 Unspecified lump in the right breast, unspecified quadrant: Secondary | ICD-10-CM

## 2015-04-05 DIAGNOSIS — R928 Other abnormal and inconclusive findings on diagnostic imaging of breast: Secondary | ICD-10-CM

## 2015-04-05 DIAGNOSIS — R599 Enlarged lymph nodes, unspecified: Secondary | ICD-10-CM

## 2015-04-05 DIAGNOSIS — C9110 Chronic lymphocytic leukemia of B-cell type not having achieved remission: Secondary | ICD-10-CM

## 2015-04-05 DIAGNOSIS — R591 Generalized enlarged lymph nodes: Secondary | ICD-10-CM

## 2015-04-05 DIAGNOSIS — N63 Unspecified lump in breast: Secondary | ICD-10-CM | POA: Diagnosis not present

## 2015-04-05 DIAGNOSIS — D630 Anemia in neoplastic disease: Secondary | ICD-10-CM | POA: Diagnosis not present

## 2015-04-05 DIAGNOSIS — R590 Localized enlarged lymph nodes: Secondary | ICD-10-CM

## 2015-04-05 HISTORY — DX: Generalized enlarged lymph nodes: R59.1

## 2015-04-05 LAB — COMPREHENSIVE METABOLIC PANEL (CC13)
ALT: 15 U/L (ref 0–55)
ANION GAP: 9 meq/L (ref 3–11)
AST: 21 U/L (ref 5–34)
Albumin: 4.5 g/dL (ref 3.5–5.0)
Alkaline Phosphatase: 76 U/L (ref 40–150)
BUN: 11.4 mg/dL (ref 7.0–26.0)
CO2: 26 mEq/L (ref 22–29)
CREATININE: 0.9 mg/dL (ref 0.6–1.1)
Calcium: 10.2 mg/dL (ref 8.4–10.4)
Chloride: 107 mEq/L (ref 98–109)
EGFR: 86 mL/min/{1.73_m2} — AB (ref >90)
Glucose: 92 mg/dl (ref 70–140)
POTASSIUM: 3.9 meq/L (ref 3.5–5.1)
Sodium: 142 mEq/L (ref 136–145)
Total Bilirubin: 0.83 mg/dL (ref 0.20–1.20)
Total Protein: 7.6 g/dL (ref 6.4–8.3)

## 2015-04-05 LAB — CBC WITH DIFFERENTIAL/PLATELET
BASO%: 0.2 % (ref 0.0–2.0)
Basophils Absolute: 0.6 10*3/uL — ABNORMAL HIGH (ref 0.0–0.1)
EOS ABS: 1.1 10*3/uL — AB (ref 0.0–0.5)
EOS%: 0.4 % (ref 0.0–7.0)
HEMATOCRIT: 32.4 % — AB (ref 34.8–46.6)
HGB: 10.5 g/dL — ABNORMAL LOW (ref 11.6–15.9)
LYMPH#: 240.3 10*3/uL — AB (ref 0.9–3.3)
LYMPH%: 95.9 % — ABNORMAL HIGH (ref 14.0–49.7)
MCH: 28.9 pg (ref 25.1–34.0)
MCHC: 32.3 g/dL (ref 31.5–36.0)
MCV: 89.4 fL (ref 79.5–101.0)
MONO#: 4.2 10*3/uL — ABNORMAL HIGH (ref 0.1–0.9)
MONO%: 1.7 % (ref 0.0–14.0)
NEUT%: 1.8 % — ABNORMAL LOW (ref 38.4–76.8)
NEUTROS ABS: 4.5 10*3/uL (ref 1.5–6.5)
Platelets: 278 10*3/uL (ref 145–400)
RBC: 3.62 10*6/uL — ABNORMAL LOW (ref 3.70–5.45)
RDW: 16.7 % — ABNORMAL HIGH (ref 11.2–14.5)
WBC: 250.7 10*3/uL (ref 3.9–10.3)

## 2015-04-05 LAB — HEPATITIS B SURFACE ANTIGEN: Hepatitis B Surface Ag: NEGATIVE

## 2015-04-05 LAB — URIC ACID (CC13): Uric Acid, Serum: 7.4 mg/dl (ref 2.6–7.4)

## 2015-04-05 LAB — HEPATITIS B CORE ANTIBODY, IGM: Hep B C IgM: NONREACTIVE

## 2015-04-05 LAB — TECHNOLOGIST REVIEW

## 2015-04-05 LAB — HEPATITIS B SURFACE ANTIBODY,QUALITATIVE: HEP B S AB: POSITIVE — AB

## 2015-04-05 LAB — LACTATE DEHYDROGENASE (CC13): LDH: 371 U/L — ABNORMAL HIGH (ref 125–245)

## 2015-04-05 NOTE — Assessment & Plan Note (Signed)
This is likely anemia of chronic disease. The patient denies recent history of bleeding such as epistaxis, hematuria or hematochezia. She is asymptomatic from the anemia. We will observe for now.  

## 2015-04-05 NOTE — Assessment & Plan Note (Signed)
She has extreme leukocytosis with large palpable neck lymphadenopathy, diffuse lymphadenopathy everywhere and large breast mass I am concerned about transformation to large cell lymphoma. I recommend urgent blood work and PET scan for staging She may need biopsy of neck or breast mass depending on PET avidity I discussed the importance of pursuing aggressive work-up as I believe observation route is no longer an option

## 2015-04-05 NOTE — Assessment & Plan Note (Signed)
Could be due to CLL or lymphoma Restage as above Breast cancer cannot be excluded

## 2015-04-05 NOTE — Progress Notes (Signed)
[place] progress notes  Patient Care Team: Janith Lima, MD as PCP - General (Internal Medicine)  CHIEF COMPLAINTS/PURPOSE OF VISIT:  CLL with extreme leukocytosis  HISTORY OF PRESENTING ILLNESS:  Audrey Gross 54 y.o. female was transferred to my care after her prior physician has left.  I reviewed the patient's records extensive and collaborated the history with the patient. Summary of her history is as follows: She was originally diagnosed with over 3 years ago with abnormal blood count and palpable lymphadenopathy. No report confirming CLL can be found in her electronic records. She had neck CT scan but was recommended observation. She has large breast mass found on mammogram and was told it was a cyst. No biopsy was performed on the right breast. She had prior left breast biopsy but report was not available. She was told it was benign She was intermittent night sweats. She noted right neck mass is getting bigger but it does not bother her. She denies weight loss or anorexia.  MEDICAL HISTORY:  Past Medical History  Diagnosis Date  . Hypertension   . CLL (chronic lymphoblastic leukemia)   . Depression   . Iron deficiency anemia, unspecified   . Diffuse cystic mastopathy   . Cystic breast   . Systolic ejection murmur   . Medical non-compliance   . Dyslipidemia (high LDL; low HDL)   . Osteopenia   . Lymphadenopathy of head and neck 04/05/2015    SURGICAL HISTORY: Past Surgical History  Procedure Laterality Date  . Tubal ligation      SOCIAL HISTORY: History   Social History  . Marital Status: Legally Separated    Spouse Name: N/A  . Number of Children: 6  . Years of Education: N/A   Occupational History  . Inspector    Social History Main Topics  . Smoking status: Never Smoker   . Smokeless tobacco: Never Used  . Alcohol Use: No  . Drug Use: No  . Sexual Activity: Yes    Birth Control/ Protection: Surgical   Other Topics  Concern  . Not on file   Social History Narrative    FAMILY HISTORY: Family History  Problem Relation Age of Onset  . Kidney disease Father     H/O HD and kidney transplant  . Stomach cancer Maternal Aunt     ALLERGIES:  has No Known Allergies.  MEDICATIONS:  Current Outpatient Prescriptions  Medication Sig Dispense Refill  . Olmesartan-Amlodipine-HCTZ 40-5-12.5 MG TABS Take 1 tablet by mouth daily. 90 tablet 3  . potassium chloride SA (K-DUR,KLOR-CON) 20 MEQ tablet Take 1 tablet (20 mEq total) by mouth 2 (two) times daily. 60 tablet 5   No current facility-administered medications for this visit.    REVIEW OF SYSTEMS:   Constitutional: Denies fevers, chills  Eyes: Denies blurriness of vision, double vision or watery eyes Ears, nose, mouth, throat, and face: Denies mucositis or sore throat Respiratory: Denies cough, dyspnea or wheezes Cardiovascular: Denies palpitation, chest discomfort or lower extremity swelling Gastrointestinal:  Denies nausea, heartburn or change in bowel habits Skin: Denies abnormal skin rashes Neurological:Denies numbness, tingling or new weaknesses Behavioral/Psych: Mood is stable, no new changes  All other systems were reviewed with the patient and are negative.  PHYSICAL EXAMINATION: ECOG PERFORMANCE STATUS: 1 - Symptomatic but completely ambulatory  Filed Vitals:   04/05/15 1413  BP: 189/87  Pulse: 52  Temp: 98.1 F (36.7 C)  Resp: 18   Filed Weights   04/05/15 1413  Weight: 178 lb (80.74 kg)    GENERAL:alert, no distress and comfortable SKIN: skin color, texture, turgor are normal, no rashes or significant lesions EYES: normal, conjunctiva are pink and non-injected, sclera clear OROPHARYNX:no exudate, normal lips, buccal mucosa, and tongue  NECK: supple, thyroid normal size, non-tender, without nodularity LYMPH:  She has diffuse palpable lymphadenopathy in neck, axilla and inguinal regions bilaterally LUNGS: clear to auscultation  and percussion with normal breathing effort HEART: regular rate & rhythm and no murmurs without lower extremity edema ABDOMEN:abdomen soft, non-tender and normal bowel sounds. Large retroperitoneal lymphadenopathy and splenomegaly is noted Musculoskeletal:no cyanosis of digits and no clubbing  PSYCH: alert & oriented x 3 with fluent speech NEURO: no focal motor/sensory deficits BREAST: large right breast mass at 12 o'clock position. No palpable left breast mass  LABORATORY DATA:  I have reviewed the data as listed Lab Results  Component Value Date   WBC 250.7* 04/05/2015   HGB 10.5* 04/05/2015   HCT 32.4* 04/05/2015   MCV 89.4 04/05/2015   PLT 278 04/05/2015    Recent Labs  03/29/15 1419 04/05/15 1507  NA 139 142  K 3.6 3.9  CL 105  --   CO2 28 26  GLUCOSE 93 92  BUN 9 11.4  CREATININE 0.81 0.9  CALCIUM 9.6 10.2  PROT 7.4 7.6  ALBUMIN 4.4 4.5  AST 18 21  ALT 17 15  ALKPHOS 69 76  BILITOT 0.8 0.83    RADIOGRAPHIC STUDIES:I reviewed her prior CT scan I have personally reviewed the radiological images as listed and agreed with the findings in the report. ASSESSMENT & PLAN:  CLL (chronic lymphocytic leukemia) She has extreme leukocytosis with large palpable neck lymphadenopathy, diffuse lymphadenopathy everywhere and large breast mass I am concerned about transformation to large cell lymphoma. I recommend urgent blood work and PET scan for staging She may need biopsy of neck or breast mass depending on PET avidity I discussed the importance of pursuing aggressive work-up as I believe observation route is no longer an option  Lymphadenopathy of head and neck This was confirmed on prior CT scan but biopsy was not done I will restage with PET scan  Abnormal mammogram of right breast Could be due to CLL or lymphoma Restage as above Breast cancer cannot be excluded  Anemia in neoplastic disease This is likely anemia of chronic disease. The patient denies recent  history of bleeding such as epistaxis, hematuria or hematochezia. She is asymptomatic from the anemia. We will observe for now.   Orders Placed This Encounter  Procedures  . NM PET Image Initial (PI) Skull Base To Thigh    Standing Status: Future     Number of Occurrences:      Standing Expiration Date: 06/04/2016    Order Specific Question:  Reason for Exam (SYMPTOM  OR DIAGNOSIS REQUIRED)    Answer:  lymphoma staging, breast mass and neck masses    Order Specific Question:  Is the patient pregnant?    Answer:  No    Order Specific Question:  Preferred imaging location?    Answer:  [facility]  . CBC with Differential/Platelet    Standing Status: Future     Number of Occurrences: 1     Standing Expiration Date: 05/09/2016  . Comprehensive metabolic panel    Standing Status: Future     Number of Occurrences: 1     Standing Expiration Date: 05/09/2016  . Lactate dehydrogenase    Standing Status: Future  Number of Occurrences: 1     Standing Expiration Date: 05/09/2016  . Uric Acid    Standing Status: Future     Number of Occurrences: 1     Standing Expiration Date: 05/09/2016  . Hepatitis B core antibody, IgM    Standing Status: Future     Number of Occurrences: 1     Standing Expiration Date: 05/09/2016  . Hepatitis B surface antibody    Standing Status: Future     Number of Occurrences: 1     Standing Expiration Date: 05/09/2016  . Hepatitis B surface antigen    Standing Status: Future     Number of Occurrences: 1     Standing Expiration Date: 05/09/2016  . Flow Cytometry    CLL    Standing Status: Future     Number of Occurrences: 1     Standing Expiration Date: 05/09/2016  . FISH, Peripheral Blood    CLL panel    Standing Status: Future     Number of Occurrences: 1     Standing Expiration Date: 05/09/2016    All questions were answered. The patient knows to call the clinic with any problems, questions or concerns. I spent 55 minutes counseling the patient face to  face. The total time spent in the appointment was 60 minutes and more than 50% was on counseling.     [facility], Lucie Friedlander, MD 04/05/2015 9:36 PM

## 2015-04-05 NOTE — Assessment & Plan Note (Signed)
This was confirmed on prior CT scan but biopsy was not done I will restage with PET scan

## 2015-04-05 NOTE — Telephone Encounter (Signed)
perp of to sch pt appt-gave pt avs*sent back to lab °

## 2015-04-06 NOTE — Progress Notes (Signed)
1) Breast cancer risk profile  Menarche age 54, currently menopausal since 6 months ago 6 children, she was 70 when her first child was born She breast fed all children >18 months She had remote use of OCP, no HRT No family history of breast/ovarian cancer  2) Essential hypertension she will continue current medical management. I recommend close follow-up with primary care doctor for medication adjustment.

## 2015-04-10 ENCOUNTER — Ambulatory Visit: Payer: Self-pay

## 2015-04-10 ENCOUNTER — Ambulatory Visit: Payer: Self-pay | Admitting: Hematology and Oncology

## 2015-04-11 LAB — FLOW CYTOMETRY

## 2015-04-12 LAB — TISSUE HYBRIDIZATION TO NCBH

## 2015-04-12 LAB — FISH, PERIPHERAL BLOOD

## 2015-04-16 ENCOUNTER — Other Ambulatory Visit: Payer: Self-pay | Admitting: Hematology and Oncology

## 2015-04-16 ENCOUNTER — Telehealth: Payer: Self-pay | Admitting: *Deleted

## 2015-04-16 ENCOUNTER — Telehealth: Payer: Self-pay | Admitting: Hematology and Oncology

## 2015-04-16 NOTE — Telephone Encounter (Signed)
Informed pt of MD appt tomorrow w/ Dr. Alvy Bimler will need to be r/s to next week on Wed 7/20 at 11 am due to PET scan has not been scheduled yet.   Informed PET is now scheduled for Tues 7/19 at 7 am at [facility].  Pt to arrive at 6:30 am at Radiology dept and nothing to eat or drink after midnight.   Pt verbalized understanding of new appts.  Asked her to call us back if any questions or concerns.   POF sent to scheduler to r/s MD from tomorrow to 7/20.

## 2015-04-16 NOTE — Telephone Encounter (Signed)
cx tomorrow appt and moved to 7.20 pe rpof..per pof pt aware

## 2015-04-17 ENCOUNTER — Ambulatory Visit: Payer: Self-pay | Admitting: Hematology and Oncology

## 2015-04-17 ENCOUNTER — Other Ambulatory Visit: Payer: Self-pay

## 2015-04-20 ENCOUNTER — Other Ambulatory Visit: Payer: Self-pay | Admitting: Surgery

## 2015-04-20 DIAGNOSIS — N6310 Unspecified lump in the right breast, unspecified quadrant: Secondary | ICD-10-CM

## 2015-04-24 ENCOUNTER — Ambulatory Visit (HOSPITAL_COMMUNITY)
Admission: RE | Admit: 2015-04-24 | Discharge: 2015-04-24 | Disposition: A | Discharge status: Home / Self Care | Payer: BLUE CROSS/BLUE SHIELD | Source: Ambulatory Visit | Attending: Hematology and Oncology | Admitting: Hematology and Oncology

## 2015-04-24 DIAGNOSIS — N6310 Unspecified lump in the right breast, unspecified quadrant: Secondary | ICD-10-CM

## 2015-04-24 DIAGNOSIS — R591 Generalized enlarged lymph nodes: Secondary | ICD-10-CM

## 2015-04-24 DIAGNOSIS — C9110 Chronic lymphocytic leukemia of B-cell type not having achieved remission: Secondary | ICD-10-CM | POA: Insufficient documentation

## 2015-04-24 DIAGNOSIS — R599 Enlarged lymph nodes, unspecified: Secondary | ICD-10-CM | POA: Insufficient documentation

## 2015-04-24 DIAGNOSIS — N63 Unspecified lump in breast: Secondary | ICD-10-CM | POA: Diagnosis not present

## 2015-04-24 LAB — GLUCOSE, CAPILLARY: Glucose-Capillary: 98 mg/dL (ref 65–99)

## 2015-04-24 MED ORDER — FLUDEOXYGLUCOSE F - 18 (FDG) INJECTION
8.7900 | Freq: Once | INTRAVENOUS | Status: AC | PRN
  Administered 2015-04-24: 8.79 via INTRAVENOUS

## 2015-04-25 ENCOUNTER — Ambulatory Visit (HOSPITAL_BASED_OUTPATIENT_CLINIC_OR_DEPARTMENT_OTHER): Payer: BLUE CROSS/BLUE SHIELD | Admitting: Hematology and Oncology

## 2015-04-25 ENCOUNTER — Encounter: Payer: Self-pay | Admitting: Hematology and Oncology

## 2015-04-25 ENCOUNTER — Ambulatory Visit
Admission: RE | Admit: 2015-04-25 | Discharge: 2015-04-25 | Disposition: A | Discharge status: Home / Self Care | Payer: BLUE CROSS/BLUE SHIELD | Source: Ambulatory Visit | Attending: Surgery | Admitting: Surgery

## 2015-04-25 ENCOUNTER — Other Ambulatory Visit: Payer: Self-pay | Admitting: *Deleted

## 2015-04-25 ENCOUNTER — Other Ambulatory Visit: Payer: Self-pay | Admitting: Internal Medicine

## 2015-04-25 ENCOUNTER — Telehealth: Payer: Self-pay | Admitting: Hematology and Oncology

## 2015-04-25 ENCOUNTER — Telehealth: Payer: Self-pay | Admitting: *Deleted

## 2015-04-25 VITALS — BP 170/76 | HR 58 | Temp 98.3°F | Resp 18 | Ht 67.0 in | Wt 177.7 lb

## 2015-04-25 DIAGNOSIS — I10 Essential (primary) hypertension: Secondary | ICD-10-CM

## 2015-04-25 DIAGNOSIS — N6310 Unspecified lump in the right breast, unspecified quadrant: Secondary | ICD-10-CM

## 2015-04-25 DIAGNOSIS — C9110 Chronic lymphocytic leukemia of B-cell type not having achieved remission: Secondary | ICD-10-CM | POA: Diagnosis not present

## 2015-04-25 DIAGNOSIS — D630 Anemia in neoplastic disease: Secondary | ICD-10-CM | POA: Diagnosis not present

## 2015-04-25 DIAGNOSIS — R928 Other abnormal and inconclusive findings on diagnostic imaging of breast: Secondary | ICD-10-CM

## 2015-04-25 LAB — HM MAMMOGRAPHY

## 2015-04-25 MED ORDER — IBRUTINIB 140 MG PO CAPS: 420.0000 mg | ORAL_CAPSULE | Freq: Every day | ORAL | Status: DC

## 2015-04-25 NOTE — Telephone Encounter (Signed)
Gave patient avs report and appointments for August.  °

## 2015-04-25 NOTE — Assessment & Plan Note (Addendum)
I reviewed the imaging study with the patient and her husband. Recent FISH analysis confirmed she have p53 mutation. This is a bad prognostic feature. Thankfully, the first line of treatment, Ibrutinib is very effective for this disease The role of treatment is to induce remission but is not curative. The decision was made based on publication at the [facility]. It is a category 1 recommendation from NCCN.  Ibrutinib versus Ofatumumab in Previously Treated Chronic Lymphoid Leukemia Gasper Lloyd, M.D., Edmonia Lynch. Owens Shark, M.D., Ph.D., Douglass Rivers, M.D., Link Snuffer, M.D., Jeanne Ivan, M.D., Serita Sheller, M.B., B.S., Lauris Chroman, M.D., Hilary Hertz. Marti Sleigh, M.D., Macarthur Critchley, M.B., B.S., Ph.D., Chrisandra Carota, M.D., Milly Jakob, M.D., Carey Bullocks. Aris Lot, M.D., Jeanine Luz. Valera Castle, M.D., Mahala Menghini, M.D., Yvonne Kendall, M.D., Harland German, M.D., Ph.D., Laury Deep, M.D., Evelena Leyden, M.D., Assunta Gambles, M.D., Esau Grew, M.D., Wyatt Mage, M.D., Joyce Gross, M.D., Olena Mater, M.D., Minda Ditto, M.D., William Dalton, M.D., Stark Klein, Ph.D., F.R.C.Path., Carmelina Peal, M.D., Neal Dy, M.D., Gentry Fitz, M.D., Vida Rigger. Sherrian Divers, M.D., Lenoard Aden, Ph.D., Earnestine Mealing, M.D., Oretha Caprice, D.[place]., Lowry Ram, M.D., and Raford Pitcher, M.B., Ch.B., Ph.D., for the [employer]* [employer] [facility] [id number] 17, 2014DOI: 10.1056/[id number]  The chemotherapy consists of Ibrutinib, a Bruton's tyrosine kinase inhibitor. We discussed the role of chemotherapy. The intent is for palliative.   Ibrutinib is taken as a single agent oral treatment indefinitely until unexpected side-effects or lack of response.  In this multicenter, open-label, phase 3 study, we randomly assigned 391 patients with relapsed or refractory CLL or SLL to receive daily ibrutinib or the anti-CD20 antibody ofatumumab. The primary end point was the  duration of progression-free survival, with the duration of overall survival and the overall response rate as secondary end points.  At a median follow-up of 9.4 months, ibrutinib significantly improved progression-free survival; the median duration was not reached in the ibrutinib group (with a rate of progression-free survival of 88% at 6 months), as compared with a median of 8.1 months in the ofatumumab group (hazard ratio for progression or death in the ibrutinib group, 0.22; P<0.001). Ibrutinib also significantly improved overall survival (hazard ratio for death, 0.43; P=0.005). At 12 months, the overall survival rate was 90% in the ibrutinib group and 81% in the ofatumumab group. The overall response rate was significantly higher in the ibrutinib group than in the ofatumumab group (42.6% vs. 4.1%, P<0.001). An additional 20% of ibrutinib-treated patients had a partial response with lymphocytosis. Similar effects were observed regardless of whether patients had a chromosome 17p13.1 deletion or resistance to purine analogues.   The most frequent nonhematologic adverse events were diarrhea, fatigue, pyrexia, and nausea in the ibrutinib group and fatigue, infusion-related reactions, and cough in the ofatumumab group.  Some of the short term side-effects included, though not limited to, risk of fatigue, weight loss, tumor lysis syndrome, risk of allergic reactions, pancytopenia, bruising, diarrhea, transient leukocytosis, life-threatening infections, need for transfusions of blood products, nausea, vomiting, change in bowel habits, admission to hospital for various reasons, and risks of death.   The patient is aware that the response rates discussed earlier is not guaranteed.    After a long discussion, patient made an informed decision to proceed with the prescribed plan of care.  Due to high risk of tumor lysis syndrome, I will prescribe allopurinol. I recommend she hold her blood pressure medication  due to risk of kidney injury. She  is aware that the preauthorization process can take up to a week.  I plan to see her back within 3 weeks for further assessment.

## 2015-04-25 NOTE — Assessment & Plan Note (Signed)
I recommend holding off her blood pressure medicine even though she is mildly hypertensive due to risks of kidney injury. Plan to restart her blood pressure medicine the next time I see her.

## 2015-04-25 NOTE — Assessment & Plan Note (Signed)
This is likely anemia of chronic disease. The patient denies recent history of bleeding such as epistaxis, hematuria or hematochezia. She is asymptomatic from the anemia. We will observe for now.  

## 2015-04-25 NOTE — Telephone Encounter (Signed)
New Rx for Ibrutinib faxed to [facility].  Rx placed in pharmacy book at desk.

## 2015-04-25 NOTE — Telephone Encounter (Signed)
[employer] faxed Prior authorization request for Imbrivica.  Request to [facility] for review.

## 2015-04-25 NOTE — Progress Notes (Signed)
[place] OFFICE PROGRESS NOTE  Patient Care Team: Janith Lima, MD as PCP - General (Internal Medicine)  SUMMARY OF ONCOLOGIC HISTORY:  I reviewed the patient's records extensive and collaborated the history with the patient. Summary of her history is as follows: She was originally diagnosed with over 3 years ago with abnormal blood count and palpable lymphadenopathy. No report confirming CLL can be found in her electronic records. She had neck CT scan but was recommended observation. She has large breast mass found on mammogram and was told it was a cyst. No biopsy was performed on the right breast. She had prior left breast biopsy but report was not available. She was told it was benign  She was intermittent night sweats.   Breast cancer risk profile  Menarche age 39, currently menopausal since 6 months ago 6 children, she was 31 when her first child was born She breast fed all children >18 months She had remote use of OCP, no HRT No family history of breast/ovarian cancer  She noted right neck mass is getting bigger but it does not bother her. She denies weight loss or anorexia. Repeat flow cytometry on 04/06/2015 confrimed CLL. Fish panel detected trisomy 12 and deletion 17p. PET CT scan showed diffuse disease with splenomegaly  INTERVAL HISTORY: Please see below for problem oriented charting.  she returns for further follow-up. She continues to have night sweats and neck lymph node enlargement. It does not cause any pain.  REVIEW OF SYSTEMS:   Constitutional: Denies fevers, chills or abnormal weight loss Eyes: Denies blurriness of vision Ears, nose, mouth, throat, and face: Denies mucositis or sore throat Respiratory: Denies cough, dyspnea or wheezes Cardiovascular: Denies palpitation, chest discomfort or lower extremity swelling Gastrointestinal:  Denies nausea, heartburn or change in bowel habits Skin: Denies abnormal skin rashes Neurological:Denies  numbness, tingling or new weaknesses Behavioral/Psych: Mood is stable, no new changes  All other systems were reviewed with the patient and are negative.  I have reviewed the past medical history, past surgical history, social history and family history with the patient and they are unchanged from previous note.  ALLERGIES:  has No Known Allergies.  MEDICATIONS:  Current Outpatient Prescriptions  Medication Sig Dispense Refill  . atenolol (TENORMIN) 25 MG tablet Take 25 mg by mouth daily.    . hydrochlorothiazide (HYDRODIURIL) 25 MG tablet Take 25 mg by mouth daily.    . potassium chloride SA (K-DUR,KLOR-CON) 20 MEQ tablet Take 1 tablet (20 mEq total) by mouth 2 (two) times daily. 60 tablet 5  . ibrutinib (IMBRUVICA) 140 MG capsul Take 3 capsules (420 mg total) by mouth daily. 90 capsule 3   No current facility-administered medications for this visit.    PHYSICAL EXAMINATION: ECOG PERFORMANCE STATUS: 1 - Symptomatic but completely ambulatory  Filed Vitals:   04/25/15 1103  BP: 170/76  Pulse: 58  Temp: 98.3 F (36.8 C)  Resp: 18   Filed Weights   04/25/15 1103  Weight: 177 lb 11.2 oz (80.604 kg)    GENERAL:alert, no distress and comfortable SKIN: skin color, texture, turgor are normal, no rashes or significant lesions EYES: normal, Conjunctiva are pink and non-injected, sclera clear LYMPH:   She has diffuse bilateral lymphadenopathy. Musculoskeletal:no cyanosis of digits and no clubbing  NEURO: alert & oriented x 3 with fluent speech, no focal motor/sensory deficits  LABORATORY DATA:  I have reviewed the data as listed    Component Value Date/Time   NA 142 04/05/2015 1507  NA 139 03/29/2015 1419   K 3.9 04/05/2015 1507   K 3.6 03/29/2015 1419   CL 105 03/29/2015 1419   CL 105 01/18/2013 0912   CO2 26 04/05/2015 1507   CO2 28 03/29/2015 1419   GLUCOSE 92 04/05/2015 1507   GLUCOSE 93 03/29/2015 1419   GLUCOSE 83 01/18/2013 0912   BUN 11.4 04/05/2015 1507   BUN  9 03/29/2015 1419   CREATININE 0.9 04/05/2015 1507   CREATININE 0.81 03/29/2015 1419   CALCIUM 10.2 04/05/2015 1507   CALCIUM 9.6 03/29/2015 1419   PROT 7.6 04/05/2015 1507   PROT 7.4 03/29/2015 1419   ALBUMIN 4.5 04/05/2015 1507   ALBUMIN 4.4 03/29/2015 1419   AST 21 04/05/2015 1507   AST 18 03/29/2015 1419   ALT 15 04/05/2015 1507   ALT 17 03/29/2015 1419   ALKPHOS 76 04/05/2015 1507   ALKPHOS 69 03/29/2015 1419   BILITOT 0.83 04/05/2015 1507   BILITOT 0.8 03/29/2015 1419    No results found for: SPEP, UPEP  Lab Results  Component Value Date   WBC 250.7* 04/05/2015   NEUTROABS 4.5 04/05/2015   HGB 10.5* 04/05/2015   HCT 32.4* 04/05/2015   MCV 89.4 04/05/2015   PLT 278 04/05/2015      Chemistry      Component Value Date/Time   NA 142 04/05/2015 1507   NA 139 03/29/2015 1419   K 3.9 04/05/2015 1507   K 3.6 03/29/2015 1419   CL 105 03/29/2015 1419   CL 105 01/18/2013 0912   CO2 26 04/05/2015 1507   CO2 28 03/29/2015 1419   BUN 11.4 04/05/2015 1507   BUN 9 03/29/2015 1419   CREATININE 0.9 04/05/2015 1507   CREATININE 0.81 03/29/2015 1419      Component Value Date/Time   CALCIUM 10.2 04/05/2015 1507   CALCIUM 9.6 03/29/2015 1419   ALKPHOS 76 04/05/2015 1507   ALKPHOS 69 03/29/2015 1419   AST 21 04/05/2015 1507   AST 18 03/29/2015 1419   ALT 15 04/05/2015 1507   ALT 17 03/29/2015 1419   BILITOT 0.83 04/05/2015 1507   BILITOT 0.8 03/29/2015 1419       RADIOGRAPHIC STUDIES: I have personally reviewed the radiological images as listed and agreed with the findings in the report. Nm Pet Image Initial (pi) Skull Base To Thigh  04/24/2015   CLINICAL DATA:  Initial treatment strategy for lymphoma. Breast and neck masses. Initial staging examination.  EXAM: NUCLEAR MEDICINE PET SKULL BASE TO THIGH  TECHNIQUE: 8.79 mCi F-18 FDG was injected intravenously. Full-ring PET imaging was performed from the skull base to thigh after the radiotracer. CT data was obtained and  used for attenuation correction and anatomic localization.  FASTING BLOOD GLUCOSE:  Value: 98 mg/dl  COMPARISON:  No priors.  FINDINGS: NECK  Multiple borderline enlarged and mildly enlarged lymph nodes throughout all cervical regions are conspicuous by their number, rather than merely size. These are all mildly hypermetabolic (SUVmax = 1.1-9.4), and many of these are enlarged measuring up to 16 mm in short axis.  CHEST  Extensive bilateral axillary lymphadenopathy whole, measuring up to 16 mm in short axis on the right and 17 mm in short axis on the left. No hypermetabolic mediastinal or hilar nodes. Prominence of the superior pericardial recess incidentally noted. No suspicious pulmonary nodules on the CT scan. Heart size is mildly enlarged. There is no significant pericardial fluid, thickening or pericardial calcification. There is atherosclerosis of the thoracic aorta, the great vessels  of the mediastinum and the coronary arteries, including calcified atherosclerotic plaque in the left main, left anterior descending and right coronary arteries. Esophagus is unremarkable in appearance. No consolidative airspace disease. No pleural effusions.  ABDOMEN/PELVIS  There is extensive lymphadenopathy throughout the abdomen and pelvis. Predominantly, this is manifest by greater than expected number of nonenlarged lymph nodes, however, many of these lymph nodes are mildly enlarged, and many of these are mildly hypermetabolic. Specific examples include the following. 13 mm short axis left para-aortic (image 125 of series 4) lymph node (SUVmax = 2.4). 15 mm short axis left common iliac (image 140 of series 4) lymph node (SUVmax = 2.4). 1 cm short axis left external iliac (image 158 of series 4) lymph node (SUVmax = 2.6). No abnormal hypermetabolic activity within the liver, pancreas, adrenal glands, or spleen. Spleen is enlarged measuring 13 x 4.9 x 16 cm (estimated splenic volume of 510 mL). Splenic activity appears normal  (SUVmax = 2.5) relative to the liver (SUVmax = 3.2). Mild atherosclerosis throughout the abdominal and pelvic vasculature, without definite aneurysm. No significant volume of ascites. No pneumoperitoneum. No pathologic distention of small bowel or colon.  SKELETON  No focal hypermetabolic activity to suggest skeletal metastasis.  IMPRESSION: 1. Extensive lymphadenopathy throughout the neck, chest (axilla), abdomen and pelvis, as detailed above, compatible with the reported clinical history of lymphoma. 2. Mild splenomegaly.  Spleen is not hypermetabolic at this time. 3. Atherosclerosis, including left main and 2 vessel coronary artery disease. Please note that although the presence of coronary artery calcium documents the presence of coronary artery disease, the severity of this disease and any potential stenosis cannot be assessed on this non-gated CT examination. Assessment for potential risk factor modification, dietary therapy or pharmacologic therapy may be warranted, if clinically indicated. 4. Mild cardiomegaly.   Electronically Signed   By: Vinnie Langton M.D.   On: 04/24/2015 09:54   US Breast Ltd Uni Right Inc Axilla  04/25/2015   CLINICAL DATA:  54 year old female with palpable abnormality of the upper, outer right breast for 2 weeks. The patient has a history of lymphoma.  EXAM: DIGITAL DIAGNOSTIC BILATERAL MAMMOGRAM WITH 3D TOMOSYNTHESIS WITH CAD  ULTRASOUND RIGHT BREAST  COMPARISON:  04/12/2014, 03/16/2014  ACR Breast Density Category c: The breast tissue is heterogeneously dense, which may obscure small masses.  FINDINGS: Images are limited by patient motion. The best possible images were obtained. There are multiple bilateral circumscribed oval masses. Specifically, there are several oval, circumscribed masses underlying the palpable marker of the upper, outer right breast. No suspicious mass or calcifications are identified within either breast. There is bilateral lymphadenopathy consistent  with the patient's history of lymphoma.  Mammographic images were processed with CAD.  On physical exam, there is palpable nodularity in the patient's area of concern within the upper, outer right breast.  Targeted ultrasound is performed, showing numerous cysts within the area of concern within the upper, outer right breast, the largest measuring 2 x 1.9 x 2.5 cm at 10 o'clock, 1 cm from the nipple. The next largest cyst measures 1.8 x 1.2 x 2 cm at 12 o'clock, 2 cm from the nipple. Echogenic debris is noted within both cysts. Artifact from mobile debris is seen on the color Doppler imaging of the cyst at 10 o'clock, 1 cm from the nipple.  IMPRESSION: 1. Right breast cysts. No mammographic or sonographic evidence of malignancy within the right breast. 2. Bilateral axillary adenopathy consistent with the patient's known lymphoma.  RECOMMENDATION:  1. The patient will be scheduled for aspiration of the right breast cysts described above on 04/26/2015 for symptomatic relief. 2.  Screening mammogram in one year.(Code:SM-B-01Y)  I have discussed the findings and recommendations with the patient. Results were also provided in writing at the conclusion of the visit. If applicable, a reminder letter will be sent to the patient regarding the next appointment.  BI-RADS CATEGORY  2: Benign.   Electronically Signed   By: Pamelia Hoit M.D.   On: 04/25/2015 15:02   Mm Diag Breast Tomo Bilateral  04/25/2015   CLINICAL DATA:  54 year old female with palpable abnormality of the upper, outer right breast for 2 weeks. The patient has a history of lymphoma.  EXAM: DIGITAL DIAGNOSTIC BILATERAL MAMMOGRAM WITH 3D TOMOSYNTHESIS WITH CAD  ULTRASOUND RIGHT BREAST  COMPARISON:  04/12/2014, 03/16/2014  ACR Breast Density Category c: The breast tissue is heterogeneously dense, which may obscure small masses.  FINDINGS: Images are limited by patient motion. The best possible images were obtained. There are multiple bilateral circumscribed oval  masses. Specifically, there are several oval, circumscribed masses underlying the palpable marker of the upper, outer right breast. No suspicious mass or calcifications are identified within either breast. There is bilateral lymphadenopathy consistent with the patient's history of lymphoma.  Mammographic images were processed with CAD.  On physical exam, there is palpable nodularity in the patient's area of concern within the upper, outer right breast.  Targeted ultrasound is performed, showing numerous cysts within the area of concern within the upper, outer right breast, the largest measuring 2 x 1.9 x 2.5 cm at 10 o'clock, 1 cm from the nipple. The next largest cyst measures 1.8 x 1.2 x 2 cm at 12 o'clock, 2 cm from the nipple. Echogenic debris is noted within both cysts. Artifact from mobile debris is seen on the color Doppler imaging of the cyst at 10 o'clock, 1 cm from the nipple.  IMPRESSION: 1. Right breast cysts. No mammographic or sonographic evidence of malignancy within the right breast. 2. Bilateral axillary adenopathy consistent with the patient's known lymphoma.  RECOMMENDATION: 1. The patient will be scheduled for aspiration of the right breast cysts described above on 04/26/2015 for symptomatic relief. 2.  Screening mammogram in one year.(Code:SM-B-01Y)  I have discussed the findings and recommendations with the patient. Results were also provided in writing at the conclusion of the visit. If applicable, a reminder letter will be sent to the patient regarding the next appointment.  BI-RADS CATEGORY  2: Benign.   Electronically Signed   By: Pamelia Hoit M.D.   On: 04/25/2015 15:02     ASSESSMENT & PLAN:  CLL (chronic lymphocytic leukemia)  I reviewed the imaging study with the patient and her husband. Recent FISH analysis confirmed she have p53 mutation. This is a bad prognostic feature. Thankfully, the first line of treatment, Ibrutinib is very effective for this disease The role of treatment  is to induce remission but is not curative. The decision was made based on publication at the [facility]. It is a category 1 recommendation from NCCN.  Ibrutinib versus Ofatumumab in Previously Treated Chronic Lymphoid Leukemia Gasper Lloyd, M.D., Edmonia Lynch. Owens Shark, M.D., Ph.D., Douglass Rivers, M.D., Link Snuffer, M.D., Jeanne Ivan, M.D., Serita Sheller, M.B., B.S., Lauris Chroman, M.D., Hilary Hertz. Marti Sleigh, M.D., Macarthur Critchley, M.B., B.S., Ph.D., Chrisandra Carota, M.D., Milly Jakob, M.D., Carey Bullocks. Aris Lot, M.D., Jeanine Luz. Valera Castle, M.D., Mahala Menghini, M.D., Yvonne Kendall, M.D., Harland German, M.D.,  Ph.D., Laury Deep, M.D., Evelena Leyden, M.D., Assunta Gambles, M.D., Esau Grew, M.D., Wyatt Mage, M.D., Joyce Gross, M.D., Olena Mater, M.D., Minda Ditto, M.D., William Dalton, M.D., Stark Klein, Ph.D., F.R.C.Path., Carmelina Peal, M.D., Neal Dy, M.D., Gentry Fitz, M.D., Vida Rigger. Sherrian Divers, M.D., Lenoard Aden, Ph.D., Earnestine Mealing, M.D., Oretha Caprice, D.[place]., Lowry Ram, M.D., and Raford Pitcher, M.B., Ch.B., Ph.D., for the [employer]* [employer] [facility] [id number] 17, 2014DOI: [id number]  The chemotherapy consists of Ibrutinib, a Bruton's tyrosine kinase inhibitor. We discussed the role of chemotherapy. The intent is for palliative.   Ibrutinib is taken as a single agent oral treatment indefinitely until unexpected side-effects or lack of response.  In this multicenter, open-label, phase 3 study, we randomly assigned 391 patients with relapsed or refractory CLL or SLL to receive daily ibrutinib or the anti-CD20 antibody ofatumumab. The primary end point was the duration of progression-free survival, with the duration of overall survival and the overall response rate as secondary end points.  At a median follow-up of 9.4 months, ibrutinib significantly improved progression-free survival; the median duration was not  reached in the ibrutinib group (with a rate of progression-free survival of 88% at 6 months), as compared with a median of 8.1 months in the ofatumumab group (hazard ratio for progression or death in the ibrutinib group, 0.22; P<0.001). Ibrutinib also significantly improved overall survival (hazard ratio for death, 0.43; P=0.005). At 12 months, the overall survival rate was 90% in the ibrutinib group and 81% in the ofatumumab group. The overall response rate was significantly higher in the ibrutinib group than in the ofatumumab group (42.6% vs. 4.1%, P<0.001). An additional 20% of ibrutinib-treated patients had a partial response with lymphocytosis. Similar effects were observed regardless of whether patients had a chromosome 17p13.1 deletion or resistance to purine analogues.   The most frequent nonhematologic adverse events were diarrhea, fatigue, pyrexia, and nausea in the ibrutinib group and fatigue, infusion-related reactions, and cough in the ofatumumab group.  Some of the short term side-effects included, though not limited to, risk of fatigue, weight loss, tumor lysis syndrome, risk of allergic reactions, pancytopenia, bruising, diarrhea, transient leukocytosis, life-threatening infections, need for transfusions of blood products, nausea, vomiting, change in bowel habits, admission to hospital for various reasons, and risks of death.   The patient is aware that the response rates discussed earlier is not guaranteed.    After a long discussion, patient made an informed decision to proceed with the prescribed plan of care.  Due to high risk of tumor lysis syndrome, I will prescribe allopurinol. I recommend she hold her blood pressure medication due to risk of kidney injury. She is aware that the preauthorization process can take up to a week.  I plan to see her back within 3 weeks for further assessment.  Anemia in neoplastic disease This is likely anemia of chronic disease. The patient denies  recent history of bleeding such as epistaxis, hematuria or hematochezia. She is asymptomatic from the anemia. We will observe for now.    Essential hypertension  I recommend holding off her blood pressure medicine even though she is mildly hypertensive due to risks of kidney injury. Plan to restart her blood pressure medicine the next time I see her.   Orders Placed This Encounter  Procedures  . Comprehensive metabolic panel    Standing Status: Future     Number of Occurrences:      Standing Expiration Date: 05/29/2016  . CBC with Differential/Platelet  Standing Status: Future     Number of Occurrences:      Standing Expiration Date: 05/29/2016  . Lactate dehydrogenase    Standing Status: Future     Number of Occurrences:      Standing Expiration Date: 05/29/2016   All questions were answered. The patient knows to call the clinic with any problems, questions or concerns. No barriers to learning was detected. I spent 40 minutes counseling the patient face to face. The total time spent in the appointment was 55 minutes and more than 50% was on counseling and review of test results     [facility], [place], MD 04/25/2015 4:53 PM

## 2015-04-26 ENCOUNTER — Telehealth: Payer: Self-pay | Admitting: *Deleted

## 2015-04-26 ENCOUNTER — Ambulatory Visit
Admission: RE | Admit: 2015-04-26 | Discharge: 2015-04-26 | Disposition: A | Discharge status: Home / Self Care | Payer: BLUE CROSS/BLUE SHIELD | Source: Ambulatory Visit | Attending: Internal Medicine | Admitting: Internal Medicine

## 2015-04-26 ENCOUNTER — Encounter: Payer: Self-pay | Admitting: Hematology and Oncology

## 2015-04-26 DIAGNOSIS — R928 Other abnormal and inconclusive findings on diagnostic imaging of breast: Secondary | ICD-10-CM

## 2015-04-26 MED ORDER — ALLOPURINOL 300 MG PO TABS: 300.0000 mg | ORAL_TABLET | Freq: Every day | ORAL | Status: DC

## 2015-04-26 NOTE — Telephone Encounter (Signed)
-----   Message from Heath Lark, MD sent at 04/25/2015  4:48 PM EDT ----- Regarding: medication clarification In addition to hold her BP medication, I would also like to start her on allopurinol. Please let her know it is to protect her kidneys while on treatment and she will only need to take allopurinol for 1 month Please call in allopurinol 300 mg daily PO X 30 tabs no refills to her pharmacy of choice

## 2015-04-26 NOTE — Progress Notes (Signed)
[facility] approved imbruvica from 04/25/15-04/24/16

## 2015-04-26 NOTE — Telephone Encounter (Signed)
Informed pt of Dr. Calton Dach message below and allopurinol sent to [place].  She verbalized understanding to hold BP meds and start Allopurinol.

## 2015-05-02 ENCOUNTER — Telehealth: Payer: Self-pay | Admitting: *Deleted

## 2015-05-02 NOTE — Telephone Encounter (Signed)
Unable to reach patient at any of her phone numbers. Left message for daughter that patient has a medication she needs to pick up at [place]

## 2015-05-02 NOTE — Telephone Encounter (Signed)
-----   Message from Heath Lark, MD sent at 05/01/2015  8:24 AM EDT ----- Regarding: ibrutinib Did she get her meds yet?

## 2015-05-10 ENCOUNTER — Telehealth: Payer: Self-pay | Admitting: *Deleted

## 2015-05-10 NOTE — Telephone Encounter (Signed)
Pt got Ibrutinib and started it this past Thursday 7/28.

## 2015-05-10 NOTE — Telephone Encounter (Signed)
Attempting to call pt to see if she has been able to get and start ibrutinib yet.  No VM and no answer at home or cell numbers. Will try again later today.

## 2015-05-15 ENCOUNTER — Telehealth: Payer: Self-pay | Admitting: Hematology and Oncology

## 2015-05-15 ENCOUNTER — Ambulatory Visit (HOSPITAL_BASED_OUTPATIENT_CLINIC_OR_DEPARTMENT_OTHER): Payer: BLUE CROSS/BLUE SHIELD | Admitting: Hematology and Oncology

## 2015-05-15 ENCOUNTER — Encounter: Payer: Self-pay | Admitting: Hematology and Oncology

## 2015-05-15 ENCOUNTER — Other Ambulatory Visit (HOSPITAL_BASED_OUTPATIENT_CLINIC_OR_DEPARTMENT_OTHER): Payer: BLUE CROSS/BLUE SHIELD

## 2015-05-15 VITALS — BP 202/99 | HR 79 | Temp 97.8°F | Resp 18 | Ht 67.0 in | Wt 181.6 lb

## 2015-05-15 DIAGNOSIS — D630 Anemia in neoplastic disease: Secondary | ICD-10-CM

## 2015-05-15 DIAGNOSIS — I10 Essential (primary) hypertension: Secondary | ICD-10-CM

## 2015-05-15 DIAGNOSIS — C9110 Chronic lymphocytic leukemia of B-cell type not having achieved remission: Secondary | ICD-10-CM | POA: Diagnosis not present

## 2015-05-15 LAB — CBC WITH DIFFERENTIAL/PLATELET
BASO%: 0.4 % (ref 0.0–2.0)
BASOS ABS: 0.9 10*3/uL — AB (ref 0.0–0.1)
EOS ABS: 0 10*3/uL (ref 0.0–0.5)
EOS%: 0 % (ref 0.0–7.0)
HCT: 28 % — ABNORMAL LOW (ref 34.8–46.6)
HEMOGLOBIN: 8.9 g/dL — AB (ref 11.6–15.9)
LYMPH%: 97.9 % — AB (ref 14.0–49.7)
MCH: 27.4 pg (ref 25.1–34.0)
MCHC: 31.9 g/dL (ref 31.5–36.0)
MCV: 94.5 fL (ref 79.5–101.0)
MONO#: 1 10*3/uL — ABNORMAL HIGH (ref 0.1–0.9)
MONO%: 0.5 % (ref 0.0–14.0)
NEUT#: 2.3 10*3/uL (ref 1.5–6.5)
NEUT%: 1.2 % — ABNORMAL LOW (ref 38.4–76.8)
NRBC: 0 % (ref 0–0)
PLATELETS: 294 10*3/uL (ref 145–400)
RBC: 3.25 10*6/uL — AB (ref 3.70–5.45)
RDW: 15.4 % — AB (ref 11.2–14.5)
WBC: 203.5 10*3/uL (ref 3.9–10.3)
lymph#: 199.3 10*3/uL — ABNORMAL HIGH (ref 0.9–3.3)

## 2015-05-15 LAB — COMPREHENSIVE METABOLIC PANEL (CC13)
ALT: 14 U/L (ref 0–55)
ANION GAP: 6 meq/L (ref 3–11)
AST: 15 U/L (ref 5–34)
Albumin: 4.1 g/dL (ref 3.5–5.0)
Alkaline Phosphatase: 54 U/L (ref 40–150)
BUN: 12.4 mg/dL (ref 7.0–26.0)
CO2: 27 meq/L (ref 22–29)
Calcium: 9.2 mg/dL (ref 8.4–10.4)
Chloride: 111 mEq/L — ABNORMAL HIGH (ref 98–109)
Creatinine: 0.8 mg/dL (ref 0.6–1.1)
EGFR: >90 mL/min/{1.73_m2} (ref >90)
GLUCOSE: 87 mg/dL (ref 70–140)
POTASSIUM: 3.7 meq/L (ref 3.5–5.1)
SODIUM: 144 meq/L (ref 136–145)
Total Bilirubin: 0.82 mg/dL (ref 0.20–1.20)
Total Protein: 6.9 g/dL (ref 6.4–8.3)

## 2015-05-15 LAB — LACTATE DEHYDROGENASE (CC13): LDH: 224 U/L (ref 125–245)

## 2015-05-15 NOTE — Progress Notes (Signed)
[facility] OFFICE PROGRESS NOTE  Patient Care Team: Janith Lima, MD as PCP - General (Internal Medicine)  SUMMARY OF ONCOLOGIC HISTORY:   CLL (chronic lymphocytic leukemia)   04/05/2015 Pathology Results Accession: [id number] flow cytometry confirmed CLL. FISH was positive for p53 mutation   04/24/2015 Imaging Extensive lymphadenopathy throughout the neck, chest (axilla), abdomen and pelvis, as detailed above, compatible with the reported clinical history of lymphoma. 2. Mild splenomegaly.   05/03/2015 -  Chemotherapy She started on Ibrutinib    INTERVAL HISTORY: Please see below for problem oriented charting. She returns for further follow-up. She feels well from some shortness of breath on minimal exertion. Denies any diarrhea. Complaining of fatigue. Most of the lymphadenopathy has resolved.  REVIEW OF SYSTEMS:   Constitutional: Denies fevers, chills or abnormal weight loss Eyes: Denies blurriness of vision Ears, nose, mouth, throat, and face: Denies mucositis or sore throat Cardiovascular: Denies palpitation, chest discomfort or lower extremity swelling Gastrointestinal:  Denies nausea, heartburn or change in bowel habits Skin: Denies abnormal skin rashes Lymphatics: Denies new lymphadenopathy or easy bruising Neurological:Denies numbness, tingling or new weaknesses Behavioral/Psych: Mood is stable, no new changes  All other systems were reviewed with the patient and are negative.  I have reviewed the past medical history, past surgical history, social history and family history with the patient and they are unchanged from previous note.  ALLERGIES:  has No Known Allergies.  MEDICATIONS:  Current Outpatient Prescriptions  Medication Sig Dispense Refill  . allopurinol (ZYLOPRIM) 300 MG tablet Take 1 tablet (300 mg total) by mouth daily. 30 tablet 0  . ibrutinib (IMBRUVICA) 140 MG capsul Take 3 capsules (420 mg total) by mouth daily. 90 capsule 3  . potassium  chloride SA (K-DUR,KLOR-CON) 20 MEQ tablet Take 1 tablet (20 mEq total) by mouth 2 (two) times daily. 60 tablet 5   No current facility-administered medications for this visit.    PHYSICAL EXAMINATION: ECOG PERFORMANCE STATUS: 1 - Symptomatic but completely ambulatory  Filed Vitals:   05/15/15 1046  BP: 202/99  Pulse: 79  Temp: 97.8 F (36.6 C)  Resp: 18   Filed Weights   05/15/15 1046  Weight: 181 lb 9.6 oz (82.373 kg)    GENERAL:alert, no distress and comfortable SKIN: skin color, texture, turgor are normal, no rashes or significant lesions EYES: normal, Conjunctiva are pink and non-injected, sclera clear OROPHARYNX:no exudate, no erythema and lips, buccal mucosa, and tongue normal  NECK: supple, thyroid normal size, non-tender, without nodularity LYMPH:  no palpable lymphadenopathy in the cervical, axillary or inguinal. Previously palpable lymphadenopathy has resolved LUNGS: clear to auscultation and percussion with normal breathing effort HEART: regular rate & rhythm and no murmurs and no lower extremity edema ABDOMEN:abdomen soft, non-tender and normal bowel sounds Musculoskeletal:no cyanosis of digits and no clubbing  NEURO: alert & oriented x 3 with fluent speech, no focal motor/sensory deficits  LABORATORY DATA:  I have reviewed the data as listed    Component Value Date/Time   NA 144 05/15/2015 1033   NA 139 03/29/2015 1419   K 3.7 05/15/2015 1033   K 3.6 03/29/2015 1419   CL 105 03/29/2015 1419   CL 105 01/18/2013 0912   CO2 27 05/15/2015 1033   CO2 28 03/29/2015 1419   GLUCOSE 87 05/15/2015 1033   GLUCOSE 93 03/29/2015 1419   GLUCOSE 83 01/18/2013 0912   BUN 12.4 05/15/2015 1033   BUN 9 03/29/2015 1419   CREATININE 0.8 05/15/2015 1033  CREATININE 0.81 03/29/2015 1419   CALCIUM 9.2 05/15/2015 1033   CALCIUM 9.6 03/29/2015 1419   PROT 6.9 05/15/2015 1033   PROT 7.4 03/29/2015 1419   ALBUMIN 4.1 05/15/2015 1033   ALBUMIN 4.4 03/29/2015 1419   AST 15  05/15/2015 1033   AST 18 03/29/2015 1419   ALT 14 05/15/2015 1033   ALT 17 03/29/2015 1419   ALKPHOS 54 05/15/2015 1033   ALKPHOS 69 03/29/2015 1419   BILITOT 0.82 05/15/2015 1033   BILITOT 0.8 03/29/2015 1419    No results found for: SPEP, UPEP  Lab Results  Component Value Date   WBC 203.5* 05/15/2015   NEUTROABS 2.3 05/15/2015   HGB 8.9* 05/15/2015   HCT 28.0* 05/15/2015   MCV 94.5 05/15/2015   PLT 294 05/15/2015      Chemistry      Component Value Date/Time   NA 144 05/15/2015 1033   NA 139 03/29/2015 1419   K 3.7 05/15/2015 1033   K 3.6 03/29/2015 1419   CL 105 03/29/2015 1419   CL 105 01/18/2013 0912   CO2 27 05/15/2015 1033   CO2 28 03/29/2015 1419   BUN 12.4 05/15/2015 1033   BUN 9 03/29/2015 1419   CREATININE 0.8 05/15/2015 1033   CREATININE 0.81 03/29/2015 1419      Component Value Date/Time   CALCIUM 9.2 05/15/2015 1033   CALCIUM 9.6 03/29/2015 1419   ALKPHOS 54 05/15/2015 1033   ALKPHOS 69 03/29/2015 1419   AST 15 05/15/2015 1033   AST 18 03/29/2015 1419   ALT 14 05/15/2015 1033   ALT 17 03/29/2015 1419   BILITOT 0.82 05/15/2015 1033   BILITOT 0.8 03/29/2015 1419       ASSESSMENT & PLAN:  CLL (chronic lymphocytic leukemia) She tolerated treatment well without major side effects. Most of the lymphadenopathy had resolved. I will see her back in 3 weeks to repeat history, physical examination and blood work  Anemia in neoplastic disease This is likely anemia of chronic disease. The patient denies recent history of bleeding such as epistaxis, hematuria or hematochezia. She is asymptomatic from the anemia. We will observe for now.    Essential hypertension Her blood pressure is elevated since I held her diuretics. She has no clinical signs of tumor lysis syndrome. I recommend she resume her blood pressure medications and I will reassess in the next visit   Orders Placed This Encounter  Procedures  . CBC with Differential/Platelet     Standing Status: Future     Number of Occurrences:      Standing Expiration Date: 06/18/2016  . Comprehensive metabolic panel    Standing Status: Future     Number of Occurrences:      Standing Expiration Date: 06/18/2016   All questions were answered. The patient knows to call the clinic with any problems, questions or concerns. No barriers to learning was detected. I spent 25 minutes counseling the patient face to face. The total time spent in the appointment was 30 minutes and more than 50% was on counseling and review of test results     [facility], [place], MD 05/15/2015 12:50 PM

## 2015-05-15 NOTE — Assessment & Plan Note (Signed)
This is likely anemia of chronic disease. The patient denies recent history of bleeding such as epistaxis, hematuria or hematochezia. She is asymptomatic from the anemia. We will observe for now.  

## 2015-05-15 NOTE — Assessment & Plan Note (Signed)
She tolerated treatment well without major side effects. Most of the lymphadenopathy had resolved. I will see her back in 3 weeks to repeat history, physical examination and blood work

## 2015-05-15 NOTE — Telephone Encounter (Signed)
Pt confirmed labs/ov per 08/09 POF, gave pt AVS and Calendar..... KJ

## 2015-05-15 NOTE — Assessment & Plan Note (Signed)
Her blood pressure is elevated since I held her diuretics. She has no clinical signs of tumor lysis syndrome. I recommend she resume her blood pressure medications and I will reassess in the next visit

## 2015-06-05 ENCOUNTER — Telehealth: Payer: Self-pay | Admitting: *Deleted

## 2015-06-05 ENCOUNTER — Ambulatory Visit (HOSPITAL_BASED_OUTPATIENT_CLINIC_OR_DEPARTMENT_OTHER): Payer: BLUE CROSS/BLUE SHIELD | Admitting: Hematology and Oncology

## 2015-06-05 ENCOUNTER — Other Ambulatory Visit (HOSPITAL_BASED_OUTPATIENT_CLINIC_OR_DEPARTMENT_OTHER): Payer: BLUE CROSS/BLUE SHIELD

## 2015-06-05 ENCOUNTER — Telehealth: Payer: Self-pay | Admitting: Hematology and Oncology

## 2015-06-05 ENCOUNTER — Encounter: Payer: Self-pay | Admitting: Hematology and Oncology

## 2015-06-05 VITALS — BP 204/80 | HR 60 | Temp 98.2°F | Resp 18 | Ht 67.0 in | Wt 182.5 lb

## 2015-06-05 DIAGNOSIS — I10 Essential (primary) hypertension: Secondary | ICD-10-CM | POA: Diagnosis not present

## 2015-06-05 DIAGNOSIS — C9110 Chronic lymphocytic leukemia of B-cell type not having achieved remission: Secondary | ICD-10-CM | POA: Diagnosis not present

## 2015-06-05 DIAGNOSIS — D630 Anemia in neoplastic disease: Secondary | ICD-10-CM

## 2015-06-05 LAB — COMPREHENSIVE METABOLIC PANEL (CC13)
ALT: 50 U/L (ref 0–55)
ANION GAP: 11 meq/L (ref 3–11)
AST: 35 U/L — ABNORMAL HIGH (ref 5–34)
Albumin: 4.2 g/dL (ref 3.5–5.0)
Alkaline Phosphatase: 48 U/L (ref 40–150)
BUN: 15.5 mg/dL (ref 7.0–26.0)
CALCIUM: 9.4 mg/dL (ref 8.4–10.4)
CO2: 22 meq/L (ref 22–29)
CREATININE: 0.9 mg/dL (ref 0.6–1.1)
Chloride: 109 mEq/L (ref 98–109)
EGFR: 84 mL/min/{1.73_m2} — AB (ref >90)
Glucose: 137 mg/dl (ref 70–140)
POTASSIUM: 3.2 meq/L — AB (ref 3.5–5.1)
Sodium: 143 mEq/L (ref 136–145)
Total Bilirubin: 0.88 mg/dL (ref 0.20–1.20)
Total Protein: 6.9 g/dL (ref 6.4–8.3)

## 2015-06-05 LAB — CBC WITH DIFFERENTIAL/PLATELET
BASO%: 0.3 % (ref 0.0–2.0)
Basophils Absolute: 0.6 10*3/uL — ABNORMAL HIGH (ref 0.0–0.1)
EOS ABS: 0 10*3/uL (ref 0.0–0.5)
EOS%: 0 % (ref 0.0–7.0)
HEMATOCRIT: 34.8 % (ref 34.8–46.6)
HGB: 10.5 g/dL — ABNORMAL LOW (ref 11.6–15.9)
LYMPH%: 96.8 % — AB (ref 14.0–49.7)
MCH: 27.9 pg (ref 25.1–34.0)
MCHC: 30.2 g/dL — AB (ref 31.5–36.0)
MCV: 92.6 fL (ref 79.5–101.0)
MONO#: 2.5 10*3/uL — ABNORMAL HIGH (ref 0.1–0.9)
MONO%: 1.3 % (ref 0.0–14.0)
NEUT#: 3 10*3/uL (ref 1.5–6.5)
NEUT%: 1.6 % — AB (ref 38.4–76.8)
PLATELETS: 301 10*3/uL (ref 145–400)
RBC: 3.76 10*6/uL (ref 3.70–5.45)
RDW: 14.6 % — ABNORMAL HIGH (ref 11.2–14.5)
WBC: 185.6 10*3/uL (ref 3.9–10.3)
lymph#: 179.6 10*3/uL — ABNORMAL HIGH (ref 0.9–3.3)
nRBC: 0 % (ref 0–0)

## 2015-06-05 LAB — TECHNOLOGIST REVIEW

## 2015-06-05 MED ORDER — HYDRALAZINE HCL 25 MG PO TABS: 25.0000 mg | ORAL_TABLET | Freq: Three times a day (TID) | ORAL | Status: DC

## 2015-06-05 NOTE — Assessment & Plan Note (Signed)
Her blood pressure is elevated despite resumption of recent blood pressure medications I recommend addition of hydralazine and to she can get in to see her primary care doctor for blood pressure management.

## 2015-06-05 NOTE — Assessment & Plan Note (Signed)
She tolerated treatment well without major side effects. Most of the lymphadenopathy had resolved. I will see her back in 8 weeks to repeat history, physical examination and blood work

## 2015-06-05 NOTE — Telephone Encounter (Signed)
Gave and printed appt sched and avs fo rpt for OCT °

## 2015-06-05 NOTE — Assessment & Plan Note (Signed)
This is likely anemia of chronic disease. The patient denies recent history of bleeding such as epistaxis, hematuria or hematochezia. She is asymptomatic from the anemia. We will observe for now.  

## 2015-06-05 NOTE — Progress Notes (Signed)
[place] OFFICE PROGRESS NOTE  Patient Care Team: Janith Lima, MD as PCP - General (Internal Medicine)  SUMMARY OF ONCOLOGIC HISTORY:   CLL (chronic lymphocytic leukemia)   04/05/2015 Pathology Results Accession: [id number] flow cytometry confirmed CLL. FISH was positive for p53 mutation   04/24/2015 Imaging Extensive lymphadenopathy throughout the neck, chest (axilla), abdomen and pelvis, as detailed above, compatible with the reported clinical history of lymphoma. 2. Mild splenomegaly.   05/03/2015 -  Chemotherapy She started on Ibrutinib    INTERVAL HISTORY: Please see below for problem oriented charting. She returns for further follow-up. She denies side effects from treatment such as bruising, irregular heartbeat or diarrhea. She is compliant taking her medications. She admits that she has been eating some salt laden food recently.   REVIEW OF SYSTEMS:   Constitutional: Denies fevers, chills or abnormal weight loss Eyes: Denies blurriness of vision Ears, nose, mouth, throat, and face: Denies mucositis or sore throat Respiratory: Denies cough, dyspnea or wheezes Cardiovascular: Denies palpitation, chest discomfort or lower extremity swelling Gastrointestinal:  Denies nausea, heartburn or change in bowel habits Skin: Denies abnormal skin rashes Lymphatics: Denies new lymphadenopathy or easy bruising Neurological:Denies numbness, tingling or new weaknesses Behavioral/Psych: Mood is stable, no new changes  All other systems were reviewed with the patient and are negative.  I have reviewed the past medical history, past surgical history, social history and family history with the patient and they are unchanged from previous note.  ALLERGIES:  has No Known Allergies.  MEDICATIONS:  Current Outpatient Prescriptions  Medication Sig Dispense Refill  . ibrutinib (IMBRUVICA) 140 MG capsul Take 3 capsules (420 mg total) by mouth daily. 90 capsule 3  .  olmesartan-hydrochlorothiazide (BENICAR HCT) 40-25 MG per tablet Take 1 tablet by mouth daily.    . potassium chloride SA (K-DUR,KLOR-CON) 20 MEQ tablet Take 1 tablet (20 mEq total) by mouth 2 (two) times daily. 60 tablet 5  . hydrALAZINE (APRESOLINE) 25 MG tablet Take 1 tablet (25 mg total) by mouth 3 (three) times daily. 90 tablet 1   No current facility-administered medications for this visit.    PHYSICAL EXAMINATION: ECOG PERFORMANCE STATUS: 0 - Asymptomatic  Filed Vitals:   06/05/15 0847  BP: 204/80  Pulse: 60  Temp: 98.2 F (36.8 C)  Resp: 18   Filed Weights   06/05/15 0847  Weight: 182 lb 8 oz (82.781 kg)    GENERAL:alert, no distress and comfortable SKIN: skin color, texture, turgor are normal, no rashes or significant lesions EYES: normal, Conjunctiva are pink and non-injected, sclera clear Musculoskeletal:no cyanosis of digits and no clubbing  NEURO: alert & oriented x 3 with fluent speech, no focal motor/sensory deficits  LABORATORY DATA:  I have reviewed the data as listed    Component Value Date/Time   NA 144 05/15/2015 1033   NA 139 03/29/2015 1419   K 3.7 05/15/2015 1033   K 3.6 03/29/2015 1419   CL 105 03/29/2015 1419   CL 105 01/18/2013 0912   CO2 27 05/15/2015 1033   CO2 28 03/29/2015 1419   GLUCOSE 87 05/15/2015 1033   GLUCOSE 93 03/29/2015 1419   GLUCOSE 83 01/18/2013 0912   BUN 12.4 05/15/2015 1033   BUN 9 03/29/2015 1419   CREATININE 0.8 05/15/2015 1033   CREATININE 0.81 03/29/2015 1419   CALCIUM 9.2 05/15/2015 1033   CALCIUM 9.6 03/29/2015 1419   PROT 6.9 05/15/2015 1033   PROT 7.4 03/29/2015 1419   ALBUMIN 4.1 05/15/2015 1033  ALBUMIN 4.4 03/29/2015 1419   AST 15 05/15/2015 1033   AST 18 03/29/2015 1419   ALT 14 05/15/2015 1033   ALT 17 03/29/2015 1419   ALKPHOS 54 05/15/2015 1033   ALKPHOS 69 03/29/2015 1419   BILITOT 0.82 05/15/2015 1033   BILITOT 0.8 03/29/2015 1419    No results found for: SPEP, UPEP  Lab Results   Component Value Date   WBC 185.6* 06/05/2015   NEUTROABS 3.0 06/05/2015   HGB 10.5* 06/05/2015   HCT 34.8 06/05/2015   MCV 92.6 06/05/2015   PLT 301 06/05/2015      Chemistry      Component Value Date/Time   NA 144 05/15/2015 1033   NA 139 03/29/2015 1419   K 3.7 05/15/2015 1033   K 3.6 03/29/2015 1419   CL 105 03/29/2015 1419   CL 105 01/18/2013 0912   CO2 27 05/15/2015 1033   CO2 28 03/29/2015 1419   BUN 12.4 05/15/2015 1033   BUN 9 03/29/2015 1419   CREATININE 0.8 05/15/2015 1033   CREATININE 0.81 03/29/2015 1419      Component Value Date/Time   CALCIUM 9.2 05/15/2015 1033   CALCIUM 9.6 03/29/2015 1419   ALKPHOS 54 05/15/2015 1033   ALKPHOS 69 03/29/2015 1419   AST 15 05/15/2015 1033   AST 18 03/29/2015 1419   ALT 14 05/15/2015 1033   ALT 17 03/29/2015 1419   BILITOT 0.82 05/15/2015 1033   BILITOT 0.8 03/29/2015 1419      ASSESSMENT & PLAN:  CLL (chronic lymphocytic leukemia) She tolerated treatment well without major side effects. Most of the lymphadenopathy had resolved. I will see her back in 8 weeks to repeat history, physical examination and blood work   Anemia in neoplastic disease This is likely anemia of chronic disease. The patient denies recent history of bleeding such as epistaxis, hematuria or hematochezia. She is asymptomatic from the anemia. We will observe for now.     Essential hypertension Her blood pressure is elevated despite resumption of recent blood pressure medications I recommend addition of hydralazine and to she can get in to see her primary care doctor for blood pressure management.   Orders Placed This Encounter  Procedures  . CBC with Differential/Platelet    Standing Status: Future     Number of Occurrences:      Standing Expiration Date: 07/09/2016   All questions were answered. The patient knows to call the clinic with any problems, questions or concerns. No barriers to learning was detected. I spent 15 minutes  counseling the patient face to face. The total time spent in the appointment was 20 minutes and more than 50% was on counseling and review of test results     [facility], [place], MD 06/05/2015 9:06 AM

## 2015-06-05 NOTE — Telephone Encounter (Signed)
Voicemail from patient requesting help obtaining refill on medication that requires prior authorization.  Called her to learn the Kate Sable is over her plan limit.  I am out of this medication.  [employer], "Claim exceeded plan, non-formulary  Patient to call for cost over-ride".  Aaron Edelman was able to process this medicine.  Prescription is ready for pick up with $10.00 Co-pay.  Called Ms. Malena with this information.

## 2015-07-03 ENCOUNTER — Encounter: Payer: Self-pay | Admitting: Internal Medicine

## 2015-07-03 ENCOUNTER — Ambulatory Visit (INDEPENDENT_AMBULATORY_CARE_PROVIDER_SITE_OTHER): Payer: BLUE CROSS/BLUE SHIELD | Admitting: Internal Medicine

## 2015-07-03 VITALS — BP 148/94 | HR 72 | Temp 98.2°F | Resp 16 | Ht 67.0 in | Wt 179.0 lb

## 2015-07-03 DIAGNOSIS — E876 Hypokalemia: Secondary | ICD-10-CM

## 2015-07-03 DIAGNOSIS — Z23 Encounter for immunization: Secondary | ICD-10-CM | POA: Diagnosis not present

## 2015-07-03 DIAGNOSIS — I10 Essential (primary) hypertension: Secondary | ICD-10-CM | POA: Diagnosis not present

## 2015-07-03 MED ORDER — NEBIVOLOL HCL 10 MG PO TABS: 10.0000 mg | ORAL_TABLET | Freq: Every day | ORAL | Status: DC

## 2015-07-03 MED ORDER — POTASSIUM CHLORIDE CRYS ER 20 MEQ PO TBCR: 20.0000 meq | EXTENDED_RELEASE_TABLET | Freq: Two times a day (BID) | ORAL | Status: DC

## 2015-07-03 NOTE — Patient Instructions (Signed)

## 2015-07-03 NOTE — Progress Notes (Signed)
Pre visit review using our clinic review tool, if applicable. No additional management support is needed unless otherwise documented below in the visit note. 

## 2015-07-03 NOTE — Progress Notes (Signed)
Subjective:  Patient ID: Audrey Gross, female    DOB: 1961/09/04  Age: 54 y.o. MRN: [id number]  CC: Hypertension   HPI Audrey Gross presents for a blood pressure check. About a month ago she was seen by her oncologist and her blood pressure was 204/80. At that time hydralazine was added to olmesartan/hydrochlorothiazide. She feels well but is concerned that her blood pressure is not very well controlled.  Outpatient Prescriptions Prior to Visit  Medication Sig Dispense Refill  . hydrALAZINE (APRESOLINE) 25 MG tablet Take 1 tablet (25 mg total) by mouth 3 (three) times daily. 90 tablet 1  . ibrutinib (IMBRUVICA) 140 MG capsul Take 3 capsules (420 mg total) by mouth daily. 90 capsule 3  . olmesartan-hydrochlorothiazide (BENICAR HCT) 40-25 MG per tablet Take 1 tablet by mouth daily.    . potassium chloride SA (K-DUR,KLOR-CON) 20 MEQ tablet Take 1 tablet (20 mEq total) by mouth 2 (two) times daily. 60 tablet 5   No facility-administered medications prior to visit.    ROS Review of Systems  Constitutional: Negative.  Negative for fever, chills, diaphoresis, activity change, appetite change, fatigue and unexpected weight change.  HENT: Negative.   Eyes: Negative.  Negative for photophobia and visual disturbance.  Respiratory: Negative.  Negative for cough, choking, chest tightness, shortness of breath and stridor.   Cardiovascular: Negative.  Negative for chest pain, palpitations and leg swelling.  Gastrointestinal: Negative.  Negative for nausea, vomiting, abdominal pain, diarrhea, constipation and blood in stool.  Endocrine: Negative.   Genitourinary: Negative.   Musculoskeletal: Negative.  Negative for back pain and neck pain.  Skin: Negative.   Allergic/Immunologic: Negative.   Neurological: Negative.  Negative for dizziness, weakness, light-headedness, numbness and headaches.  Hematological: Positive for adenopathy. Does not bruise/bleed easily.  Psychiatric/Behavioral: Negative.      Objective:  BP 148/94 mmHg  Pulse 72  Temp(Src) 98.2 F (36.8 C) (Oral)  Resp 16  Ht 5\' 7"  (1.702 m)  Wt 179 lb (81.194 kg)  BMI 28.03 kg/m2  SpO2 99%  LMP 07/20/2013  BP Readings from Last 3 Encounters:  07/03/15 148/94  06/05/15 204/80  05/15/15 202/99    Wt Readings from Last 3 Encounters:  07/03/15 179 lb (81.194 kg)  06/05/15 182 lb 8 oz (82.781 kg)  05/15/15 181 lb 9.6 oz (82.373 kg)    Physical Exam  Constitutional: She is oriented to person, place, and time. No distress.  HENT:  Mouth/Throat: Oropharynx is clear and moist. No oropharyngeal exudate.  Eyes: Conjunctivae are normal. Right eye exhibits no discharge. Left eye exhibits no discharge. No scleral icterus.  Neck: Normal range of motion. Neck supple. No JVD present. No tracheal deviation present. No thyromegaly present.  Cardiovascular: Normal rate, regular rhythm, normal heart sounds and intact distal pulses.  Exam reveals no gallop and no friction rub.   No murmur heard. Pulmonary/Chest: Effort normal and breath sounds normal. No stridor. No respiratory distress. She has no wheezes. She has no rales. She exhibits no tenderness.  Abdominal: Soft. Bowel sounds are normal. She exhibits no distension and no mass. There is no tenderness. There is no rebound and no guarding.  Musculoskeletal: Normal range of motion. She exhibits no edema or tenderness.  Lymphadenopathy:    She has no cervical adenopathy.  Neurological: She is oriented to person, place, and time.  Skin: Skin is warm and dry. No rash noted. She is not diaphoretic. No erythema. No pallor.  Psychiatric: She has a normal mood and  affect. Her behavior is normal. Judgment and thought content normal.  Vitals reviewed.   Lab Results  Component Value Date   WBC 185.6* 06/05/2015   HGB 10.5* 06/05/2015   HCT 34.8 06/05/2015   PLT 301 06/05/2015   GLUCOSE 137 06/05/2015   CHOL 177 03/29/2015   TRIG 138.0 03/29/2015   HDL 37.40* 03/29/2015    LDLDIRECT 144.5 07/26/2013   LDLCALC 112* 03/29/2015   ALT 50 06/05/2015   AST 35* 06/05/2015   NA 143 06/05/2015   K 3.2* 06/05/2015   CL 105 03/29/2015   CREATININE 0.9 06/05/2015   BUN 15.5 06/05/2015   CO2 22 06/05/2015   TSH 0.82 03/29/2015    US Aspiration  04/26/2015   CLINICAL DATA:  Right breast 10 o'clock complicated cysts.  EXAM: ULTRASOUND GUIDED RIGHT BREAST CYST ASPIRATION  COMPARISON:  Previous exams.  PROCEDURE: Using sterile technique, 1% lidocaine, under direct ultrasound visualization, needle aspiration of 2 right breast 10 o'clock cysts was performed. 8 cc of benign-type fluid was aspirated and discarded. Patient tolerated the procedure well.  IMPRESSION: Ultrasound-guided aspiration of the right breast No apparent complications.  RECOMMENDATIONS: Annual screening mammography is recommended, as per the diagnostic report.   Electronically Signed   By: Fidela Salisbury M.D.   On: 04/26/2015 10:10    Assessment & Plan:   Audrey Gross was seen today for hypertension.  Diagnoses and all orders for this visit:  Hypokalemia- her recent potassium level was slightly low at 3.2. She struggles with compliance of her potassium supplement therapy. I've asked her to be more compliant with this. Will recheck a potassium level during her next visit. -     potassium chloride SA (K-DUR,KLOR-CON) 20 MEQ tablet; Take 1 tablet (20 mEq total) by mouth 2 (two) times daily.  Need for influenza vaccination -     Flu Vaccine QUAD 36+ mos IM  Need for vaccination with 13-polyvalent pneumococcal conjugate vaccine -     Pneumococcal conjugate vaccine 13-valent  Essential hypertension, benign- her blood pressure is not adequately well controlled, she will continue the 3 agents she is currently on, I have added bystolic for better blood pressure control. -     potassium chloride SA (K-DUR,KLOR-CON) 20 MEQ tablet; Take 1 tablet (20 mEq total) by mouth 2 (two) times daily. -     nebivolol (BYSTOLIC)  10 MG tablet; Take 1 tablet (10 mg total) by mouth daily.   I am having Audrey Gross start on nebivolol. I am also having her maintain her ibrutinib, olmesartan-hydrochlorothiazide, hydrALAZINE, and potassium chloride SA.  Meds ordered this encounter  Medications  . potassium chloride SA (K-DUR,KLOR-CON) 20 MEQ tablet    Sig: Take 1 tablet (20 mEq total) by mouth 2 (two) times daily.    Dispense:  60 tablet    Refill:  5  . nebivolol (BYSTOLIC) 10 MG tablet    Sig: Take 1 tablet (10 mg total) by mouth daily.    Dispense:  84 tablet    Refill:  0     Follow-up: Return in about 3 months (around 10/02/2015).  [name], MD

## 2015-07-31 ENCOUNTER — Telehealth: Payer: Self-pay | Admitting: Hematology and Oncology

## 2015-07-31 ENCOUNTER — Encounter: Payer: Self-pay | Admitting: Hematology and Oncology

## 2015-07-31 ENCOUNTER — Ambulatory Visit (HOSPITAL_BASED_OUTPATIENT_CLINIC_OR_DEPARTMENT_OTHER): Payer: BLUE CROSS/BLUE SHIELD | Admitting: Hematology and Oncology

## 2015-07-31 ENCOUNTER — Other Ambulatory Visit (HOSPITAL_BASED_OUTPATIENT_CLINIC_OR_DEPARTMENT_OTHER): Payer: BLUE CROSS/BLUE SHIELD

## 2015-07-31 VITALS — BP 167/60 | HR 50 | Temp 98.2°F | Resp 18 | Ht 67.0 in | Wt 187.8 lb

## 2015-07-31 DIAGNOSIS — C9110 Chronic lymphocytic leukemia of B-cell type not having achieved remission: Secondary | ICD-10-CM

## 2015-07-31 DIAGNOSIS — D649 Anemia, unspecified: Secondary | ICD-10-CM

## 2015-07-31 DIAGNOSIS — I10 Essential (primary) hypertension: Secondary | ICD-10-CM | POA: Diagnosis not present

## 2015-07-31 LAB — CBC WITH DIFFERENTIAL/PLATELET
BASO%: 0.2 % (ref 0.0–2.0)
BASOS ABS: 0.2 10*3/uL — AB (ref 0.0–0.1)
EOS ABS: 0.1 10*3/uL (ref 0.0–0.5)
EOS%: 0.1 % (ref 0.0–7.0)
HCT: 38.7 % (ref 34.8–46.6)
HEMOGLOBIN: 12 g/dL (ref 11.6–15.9)
LYMPH%: 92.4 % — AB (ref 14.0–49.7)
MCH: 26.8 pg (ref 25.1–34.0)
MCHC: 31 g/dL — ABNORMAL LOW (ref 31.5–36.0)
MCV: 86.4 fL (ref 79.5–101.0)
MONO#: 1.6 10*3/uL — ABNORMAL HIGH (ref 0.1–0.9)
MONO%: 1.9 % (ref 0.0–14.0)
NEUT#: 4.4 10*3/uL (ref 1.5–6.5)
NEUT%: 5.4 % — ABNORMAL LOW (ref 38.4–76.8)
Platelets: 224 10*3/uL (ref 145–400)
RBC: 4.48 10*6/uL (ref 3.70–5.45)
RDW: 14.9 % — ABNORMAL HIGH (ref 11.2–14.5)
WBC: 81 10*3/uL (ref 3.9–10.3)
lymph#: 74.7 10*3/uL — ABNORMAL HIGH (ref 0.9–3.3)

## 2015-07-31 LAB — TECHNOLOGIST REVIEW

## 2015-07-31 NOTE — Progress Notes (Signed)
[place] OFFICE PROGRESS NOTE  Patient Care Team: Janith Lima, MD as PCP - General (Internal Medicine)  SUMMARY OF ONCOLOGIC HISTORY:   CLL (chronic lymphocytic leukemia) ([place])   04/05/2015 Pathology Results Accession: [id number] flow cytometry confirmed CLL. FISH was positive for p53 mutation   04/24/2015 Imaging Extensive lymphadenopathy throughout the neck, chest (axilla), abdomen and pelvis, as detailed above, compatible with the reported clinical history of lymphoma. 2. Mild splenomegaly.   05/03/2015 -  Chemotherapy She started on Ibrutinib    INTERVAL HISTORY: Please see below for problem oriented charting. She feels well. Denies new lymphadenopathy. No recent infection. Denies skin bruising or diarrhea. She has virtually no side effects from treatment. The breast mass has resolved  REVIEW OF SYSTEMS:   Constitutional: Denies fevers, chills or abnormal weight loss Eyes: Denies blurriness of vision Ears, nose, mouth, throat, and face: Denies mucositis or sore throat Respiratory: Denies cough, dyspnea or wheezes Cardiovascular: Denies palpitation, chest discomfort or lower extremity swelling Gastrointestinal:  Denies nausea, heartburn or change in bowel habits Skin: Denies abnormal skin rashes Lymphatics: Denies new lymphadenopathy or easy bruising Neurological:Denies numbness, tingling or new weaknesses Behavioral/Psych: Mood is stable, no new changes  All other systems were reviewed with the patient and are negative.  I have reviewed the past medical history, past surgical history, social history and family history with the patient and they are unchanged from previous note.  ALLERGIES:  has No Known Allergies.  MEDICATIONS:  Current Outpatient Prescriptions  Medication Sig Dispense Refill  . hydrALAZINE (APRESOLINE) 25 MG tablet Take 1 tablet (25 mg total) by mouth 3 (three) times daily. 90 tablet 1  . ibrutinib (IMBRUVICA) 140 MG capsul Take 3  capsules (420 mg total) by mouth daily. 90 capsule 3  . nebivolol (BYSTOLIC) 10 MG tablet Take 1 tablet (10 mg total) by mouth daily. 84 tablet 0  . olmesartan-hydrochlorothiazide (BENICAR HCT) 40-25 MG per tablet Take 1 tablet by mouth daily.    . potassium chloride SA (K-DUR,KLOR-CON) 20 MEQ tablet Take 1 tablet (20 mEq total) by mouth 2 (two) times daily. 60 tablet 5   No current facility-administered medications for this visit.    PHYSICAL EXAMINATION: ECOG PERFORMANCE STATUS: 0 - Asymptomatic  Filed Vitals:   07/31/15 0840  BP: 167/60  Pulse: 50  Temp: 98.2 F (36.8 C)  Resp: 18   Filed Weights   07/31/15 0840  Weight: 187 lb 12.8 oz (85.186 kg)    GENERAL:alert, no distress and comfortable SKIN: skin color, texture, turgor are normal, no rashes or significant lesions EYES: normal, Conjunctiva are pink and non-injected, sclera clear OROPHARYNX:no exudate, no erythema and lips, buccal mucosa, and tongue normal  NECK: supple, thyroid normal size, non-tender, without nodularity LYMPH:  no palpable lymphadenopathy in the cervical, axillary or inguinal LUNGS: clear to auscultation and percussion with normal breathing effort HEART: regular rate & rhythm and no murmurs and no lower extremity edema ABDOMEN:abdomen soft, non-tender and normal bowel sounds Musculoskeletal:no cyanosis of digits and no clubbing  NEURO: alert & oriented x 3 with fluent speech, no focal motor/sensory deficits  LABORATORY DATA:  I have reviewed the data as listed    Component Value Date/Time   NA 143 06/05/2015 0834   NA 139 03/29/2015 1419   K 3.2* 06/05/2015 0834   K 3.6 03/29/2015 1419   CL 105 03/29/2015 1419   CL 105 01/18/2013 0912   CO2 22 06/05/2015 0834   CO2 28 03/29/2015 1419  GLUCOSE 137 06/05/2015 0834   GLUCOSE 93 03/29/2015 1419   GLUCOSE 83 01/18/2013 0912   BUN 15.5 06/05/2015 0834   BUN 9 03/29/2015 1419   CREATININE 0.9 06/05/2015 0834   CREATININE 0.81 03/29/2015 1419    CALCIUM 9.4 06/05/2015 0834   CALCIUM 9.6 03/29/2015 1419   PROT 6.9 06/05/2015 0834   PROT 7.4 03/29/2015 1419   ALBUMIN 4.2 06/05/2015 0834   ALBUMIN 4.4 03/29/2015 1419   AST 35* 06/05/2015 0834   AST 18 03/29/2015 1419   ALT 50 06/05/2015 0834   ALT 17 03/29/2015 1419   ALKPHOS 48 06/05/2015 0834   ALKPHOS 69 03/29/2015 1419   BILITOT 0.88 06/05/2015 0834   BILITOT 0.8 03/29/2015 1419    No results found for: SPEP, UPEP  Lab Results  Component Value Date   WBC 81.0* 07/31/2015   NEUTROABS 4.4 07/31/2015   HGB 12.0 07/31/2015   HCT 38.7 07/31/2015   MCV 86.4 07/31/2015   PLT 224 07/31/2015      Chemistry      Component Value Date/Time   NA 143 06/05/2015 0834   NA 139 03/29/2015 1419   K 3.2* 06/05/2015 0834   K 3.6 03/29/2015 1419   CL 105 03/29/2015 1419   CL 105 01/18/2013 0912   CO2 22 06/05/2015 0834   CO2 28 03/29/2015 1419   BUN 15.5 06/05/2015 0834   BUN 9 03/29/2015 1419   CREATININE 0.9 06/05/2015 0834   CREATININE 0.81 03/29/2015 1419      Component Value Date/Time   CALCIUM 9.4 06/05/2015 0834   CALCIUM 9.6 03/29/2015 1419   ALKPHOS 48 06/05/2015 0834   ALKPHOS 69 03/29/2015 1419   AST 35* 06/05/2015 0834   AST 18 03/29/2015 1419   ALT 50 06/05/2015 0834   ALT 17 03/29/2015 1419   BILITOT 0.88 06/05/2015 0834   BILITOT 0.8 03/29/2015 1419      ASSESSMENT & PLAN:  CLL (chronic lymphocytic leukemia) She is responding well to treatment. White blood cell count is improving, anemia is improving and there are no clinical palpable lymphadenopathy. I recommend continue same treatment and repeat blood work and examination in 3 months. Once she has achieved hematologic remission, I plan to repeat imaging study to confirm radiological response before declaring her remission status  Essential hypertension she will continue current medical management. I recommend close follow-up with primary care doctor for medication adjustment.    Orders  Placed This Encounter  Procedures  . CBC with Differential/Platelet    Standing Status: Future     Number of Occurrences:      Standing Expiration Date: 09/03/2016  . Comprehensive metabolic panel    Standing Status: Future     Number of Occurrences:      Standing Expiration Date: 09/03/2016  . Lactate dehydrogenase    Standing Status: Future     Number of Occurrences:      Standing Expiration Date: 09/03/2016   All questions were answered. The patient knows to call the clinic with any problems, questions or concerns. No barriers to learning was detected. I spent 15 minutes counseling the patient face to face. The total time spent in the appointment was 20 minutes and more than 50% was on counseling and review of test results     [facility], [place], MD 07/31/2015 9:11 AM

## 2015-07-31 NOTE — Assessment & Plan Note (Signed)
She is responding well to treatment. White blood cell count is improving, anemia is improving and there are no clinical palpable lymphadenopathy. I recommend continue same treatment and repeat blood work and examination in 3 months. Once she has achieved hematologic remission, I plan to repeat imaging study to confirm radiological response before declaring her remission status

## 2015-07-31 NOTE — Telephone Encounter (Signed)
per pof to sch pt appt-gave pt copy of avs °

## 2015-07-31 NOTE — Assessment & Plan Note (Signed)
she will continue current medical management. I recommend close follow-up with primary care doctor for medication adjustment.  

## 2015-10-03 ENCOUNTER — Encounter: Payer: Self-pay | Admitting: Internal Medicine

## 2015-10-03 ENCOUNTER — Other Ambulatory Visit (INDEPENDENT_AMBULATORY_CARE_PROVIDER_SITE_OTHER): Payer: BLUE CROSS/BLUE SHIELD

## 2015-10-03 ENCOUNTER — Ambulatory Visit (INDEPENDENT_AMBULATORY_CARE_PROVIDER_SITE_OTHER): Payer: BLUE CROSS/BLUE SHIELD | Admitting: Internal Medicine

## 2015-10-03 VITALS — BP 160/98 | HR 60 | Temp 98.0°F | Resp 16 | Ht 67.0 in | Wt 191.0 lb

## 2015-10-03 DIAGNOSIS — E876 Hypokalemia: Secondary | ICD-10-CM | POA: Diagnosis not present

## 2015-10-03 DIAGNOSIS — I10 Essential (primary) hypertension: Secondary | ICD-10-CM

## 2015-10-03 LAB — BASIC METABOLIC PANEL
BUN: 16 mg/dL (ref 6–23)
CHLORIDE: 104 meq/L (ref 96–112)
CO2: 26 mEq/L (ref 19–32)
Calcium: 9.7 mg/dL (ref 8.4–10.5)
Creatinine, Ser: 0.84 mg/dL (ref 0.40–1.20)
GFR: 90.57 mL/min (ref >60.00)
Glucose, Bld: 87 mg/dL (ref 70–99)
Potassium: 3.9 mEq/L (ref 3.5–5.1)
Sodium: 141 mEq/L (ref 135–145)

## 2015-10-03 LAB — MAGNESIUM: MAGNESIUM: 2.1 mg/dL (ref 1.5–2.5)

## 2015-10-03 MED ORDER — HYDRALAZINE HCL 50 MG PO TABS: 50.0000 mg | ORAL_TABLET | Freq: Three times a day (TID) | ORAL | Status: DC

## 2015-10-03 MED ORDER — NEBIVOLOL HCL 10 MG PO TABS: 10.0000 mg | ORAL_TABLET | Freq: Every day | ORAL | Status: DC

## 2015-10-03 NOTE — Progress Notes (Signed)
Pre visit review using our clinic review tool, if applicable. No additional management support is needed unless otherwise documented below in the visit note. 

## 2015-10-03 NOTE — Patient Instructions (Signed)
Hypertension Hypertension, commonly called high blood pressure, is when the force of blood pumping through your arteries is too strong. Your arteries are the blood vessels that carry blood from your heart throughout your body. A blood pressure reading consists of a higher number over a lower number, such as 110/72. The higher number (systolic) is the pressure inside your arteries when your heart pumps. The lower number (diastolic) is the pressure inside your arteries when your heart relaxes. Ideally you want your blood pressure below 120/80. Hypertension forces your heart to work harder to pump blood. Your arteries may become narrow or stiff. Having untreated or uncontrolled hypertension can cause heart attack, stroke, kidney disease, and other problems. RISK FACTORS Some risk factors for high blood pressure are controllable. Others are not.  Risk factors you cannot control include:   Race. You may be at higher risk if you are African American.  Age. Risk increases with age.  Gender. Men are at higher risk than women before age 45 years. After age 65, women are at higher risk than men. Risk factors you can control include:  Not getting enough exercise or physical activity.  Being overweight.  Getting too much fat, sugar, calories, or salt in your diet.  Drinking too much alcohol. SIGNS AND SYMPTOMS Hypertension does not usually cause signs or symptoms. Extremely high blood pressure (hypertensive crisis) may cause headache, anxiety, shortness of breath, and nosebleed. DIAGNOSIS To check if you have hypertension, your health care provider will measure your blood pressure while you are seated, with your arm held at the level of your heart. It should be measured at least twice using the same arm. Certain conditions can cause a difference in blood pressure between your right and left arms. A blood pressure reading that is higher than normal on one occasion does not mean that you need treatment. If  it is not clear whether you have high blood pressure, you may be asked to return on a different day to have your blood pressure checked again. Or, you may be asked to monitor your blood pressure at home for 1 or more weeks. TREATMENT Treating high blood pressure includes making lifestyle changes and possibly taking medicine. Living a healthy lifestyle can help lower high blood pressure. You may need to change some of your habits. Lifestyle changes may include:  Following the DASH diet. This diet is high in fruits, vegetables, and whole grains. It is low in salt, red meat, and added sugars.  Keep your sodium intake below 2,300 mg per day.  Getting at least 30-45 minutes of aerobic exercise at least 4 times per week.  Losing weight if necessary.  Not smoking.  Limiting alcoholic beverages.  Learning ways to reduce stress. Your health care provider may prescribe medicine if lifestyle changes are not enough to get your blood pressure under control, and if one of the following is true:  You are 18-59 years of age and your systolic blood pressure is above 140.  You are 60 years of age or older, and your systolic blood pressure is above 150.  Your diastolic blood pressure is above 90.  You have diabetes, and your systolic blood pressure is over 140 or your diastolic blood pressure is over 90.  You have kidney disease and your blood pressure is above 140/90.  You have heart disease and your blood pressure is above 140/90. Your personal target blood pressure may vary depending on your medical conditions, your age, and other factors. HOME CARE INSTRUCTIONS    Have your blood pressure rechecked as directed by your health care provider.   Take medicines only as directed by your health care provider. Follow the directions carefully. Blood pressure medicines must be taken as prescribed. The medicine does not work as well when you skip doses. Skipping doses also puts you at risk for  problems.  Do not smoke.   Monitor your blood pressure at home as directed by your health care provider. SEEK MEDICAL CARE IF:   You think you are having a reaction to medicines taken.  You have recurrent headaches or feel dizzy.  You have swelling in your ankles.  You have trouble with your vision. SEEK IMMEDIATE MEDICAL CARE IF:  You develop a severe headache or confusion.  You have unusual weakness, numbness, or feel faint.  You have severe chest or abdominal pain.  You vomit repeatedly.  You have trouble breathing. MAKE SURE YOU:   Understand these instructions.  Will watch your condition.  Will get help right away if you are not doing well or get worse.   This information is not intended to replace advice given to you by your health care provider. Make sure you discuss any questions you have with your health care provider.   Document Released: 09/22/2005 Document Revised: 02/06/2015 Document Reviewed: 07/15/2013 Elsevier Interactive Patient Education 2016 [employer] Inc[employer]  

## 2015-10-03 NOTE — Progress Notes (Signed)
Subjective:  Patient ID: Audrey Gross, female    DOB: 21-Feb-1961  Age: 55 y.o. MRN: [id number]  CC: Hypertension   HPI Audrey Gross presents for blood pressure check. She has not been taking hydralazine. She states she has been taking her other antihypertensives. She feels well and offers no complaints.  Outpatient Prescriptions Prior to Visit  Medication Sig Dispense Refill  . ibrutinib (IMBRUVICA) 140 MG capsul Take 3 capsules (420 mg total) by mouth daily. 90 capsule 3  . olmesartan-hydrochlorothiazide (BENICAR HCT) 40-25 MG per tablet Take 1 tablet by mouth daily.    . potassium chloride SA (K-DUR,KLOR-CON) 20 MEQ tablet Take 1 tablet (20 mEq total) by mouth 2 (two) times daily. 60 tablet 5  . hydrALAZINE (APRESOLINE) 25 MG tablet Take 1 tablet (25 mg total) by mouth 3 (three) times daily. 90 tablet 1  . nebivolol (BYSTOLIC) 10 MG tablet Take 1 tablet (10 mg total) by mouth daily. 84 tablet 0   No facility-administered medications prior to visit.    ROS Review of Systems  Constitutional: Negative.  Negative for fever, chills, diaphoresis, activity change, appetite change, fatigue and unexpected weight change.  HENT: Negative.  Negative for sinus pressure, sore throat and trouble swallowing.   Eyes: Negative.  Negative for photophobia and visual disturbance.  Respiratory: Negative.  Negative for cough, choking, chest tightness, shortness of breath and stridor.   Cardiovascular: Negative.  Negative for chest pain, palpitations and leg swelling.  Gastrointestinal: Negative.  Negative for nausea, vomiting, abdominal pain, diarrhea, constipation and blood in stool.  Endocrine: Negative.   Genitourinary: Negative.  Negative for difficulty urinating.  Musculoskeletal: Negative.  Negative for myalgias, back pain, arthralgias and neck pain.  Skin: Negative.  Negative for color change and rash.  Allergic/Immunologic: Negative.   Neurological: Negative.  Negative for dizziness, speech  difficulty, weakness and light-headedness.  Hematological: Negative.  Negative for adenopathy. Does not bruise/bleed easily.  Psychiatric/Behavioral: Negative.     Objective:  BP 160/98 mmHg  Pulse 50  Temp(Src) 98 F (36.7 C) (Oral)  Ht 5\' 7"  (1.702 m)  Wt 191 lb (86.637 kg)  BMI 29.91 kg/m2  SpO2 98%  LMP 07/20/2013  BP Readings from Last 3 Encounters:  10/03/15 160/98  07/31/15 167/60  07/03/15 148/94    Wt Readings from Last 3 Encounters:  10/03/15 191 lb (86.637 kg)  07/31/15 187 lb 12.8 oz (85.186 kg)  07/03/15 179 lb (81.194 kg)    Physical Exam  Constitutional: She is oriented to person, place, and time. No distress.  HENT:  Head: Normocephalic and atraumatic.  Mouth/Throat: Oropharynx is clear and moist. No oropharyngeal exudate.  Eyes: Conjunctivae are normal. Right eye exhibits no discharge. Left eye exhibits no discharge. No scleral icterus.  Neck: Normal range of motion. Neck supple. No JVD present. No tracheal deviation present. No thyromegaly present.  Cardiovascular: Normal rate, regular rhythm, S1 normal, S2 normal and intact distal pulses.  Exam reveals no gallop, no S3, no S4 and no friction rub.   Murmur heard.  Systolic murmur is present with a grade of 1/6   No diastolic murmur is present  1/6 SEM over LLSB  Pulmonary/Chest: Effort normal and breath sounds normal. No stridor. No respiratory distress. She has no wheezes. She has no rales. She exhibits no tenderness.  Abdominal: Soft. Bowel sounds are normal. She exhibits no distension and no mass. There is no tenderness. There is no rebound and no guarding.  Musculoskeletal: Normal range of motion.  She exhibits no edema or tenderness.  Lymphadenopathy:    She has no cervical adenopathy.  Neurological: She is oriented to person, place, and time.  Skin: Skin is warm and dry. No rash noted. She is not diaphoretic. No erythema. No pallor.  Vitals reviewed.   Lab Results  Component Value Date   WBC  81.0* 07/31/2015   HGB 12.0 07/31/2015   HCT 38.7 07/31/2015   PLT 224 07/31/2015   GLUCOSE 87 10/03/2015   CHOL 177 03/29/2015   TRIG 138.0 03/29/2015   HDL 37.40* 03/29/2015   LDLDIRECT 144.5 07/26/2013   LDLCALC 112* 03/29/2015   ALT 50 06/05/2015   AST 35* 06/05/2015   NA 141 10/03/2015   K 3.9 10/03/2015   CL 104 10/03/2015   CREATININE 0.84 10/03/2015   BUN 16 10/03/2015   CO2 26 10/03/2015   TSH 0.82 03/29/2015    US Aspiration  04/26/2015  CLINICAL DATA:  Right breast 10 o'clock complicated cysts. EXAM: ULTRASOUND GUIDED RIGHT BREAST CYST ASPIRATION COMPARISON:  Previous exams. PROCEDURE: Using sterile technique, 1% lidocaine, under direct ultrasound visualization, needle aspiration of 2 right breast 10 o'clock cysts was performed. 8 cc of benign-type fluid was aspirated and discarded. Patient tolerated the procedure well. IMPRESSION: Ultrasound-guided aspiration of the right breast No apparent complications. RECOMMENDATIONS: Annual screening mammography is recommended, as per the diagnostic report. Electronically Signed   By: Fidela Salisbury M.D.   On: 04/26/2015 10:10    Assessment & Plan:   Audrey Gross was seen today for hypertension.  Diagnoses and all orders for this visit:  Essential hypertension -     nebivolol (BYSTOLIC) 10 MG tablet; Take 1 tablet (10 mg total) by mouth daily. -     hydrALAZINE (APRESOLINE) 50 MG tablet; Take 1 tablet (50 mg total) by mouth 3 (three) times daily. -     Basic metabolic panel; Future  Essential hypertension, benign- her blood pressure is not adequately well controlled, will restart hydralazine, she will also continue the other 3 agents, her electrolytes and renal function are stable today. -     nebivolol (BYSTOLIC) 10 MG tablet; Take 1 tablet (10 mg total) by mouth daily. -     hydrALAZINE (APRESOLINE) 50 MG tablet; Take 1 tablet (50 mg total) by mouth 3 (three) times daily. -     Basic metabolic panel; Future  Hypokalemia-  improvement noted, will continue potassium replacement therapy. -     Magnesium; Future -     Basic metabolic panel; Future  I have discontinued Audrey Gross's hydrALAZINE. I am also having her start on hydrALAZINE. Additionally, I am having her maintain her ibrutinib, olmesartan-hydrochlorothiazide, potassium chloride SA, and nebivolol.  Meds ordered this encounter  Medications  . nebivolol (BYSTOLIC) 10 MG tablet    Sig: Take 1 tablet (10 mg total) by mouth daily.    Dispense:  90 tablet    Refill:  3  . hydrALAZINE (APRESOLINE) 50 MG tablet    Sig: Take 1 tablet (50 mg total) by mouth 3 (three) times daily.    Dispense:  270 tablet    Refill:  1     Follow-up: Return in about 3 months (around 01/01/2016).  [name], MD

## 2015-10-09 ENCOUNTER — Other Ambulatory Visit: Payer: Self-pay | Admitting: Hematology and Oncology

## 2015-10-09 ENCOUNTER — Telehealth: Payer: Self-pay | Admitting: Hematology and Oncology

## 2015-10-09 ENCOUNTER — Telehealth: Payer: Self-pay | Admitting: *Deleted

## 2015-10-09 NOTE — Telephone Encounter (Signed)
I can see her tomorrow at 9 am, 1230 or 1 pm with labs prior to appt If she can come in, please place POF

## 2015-10-09 NOTE — Telephone Encounter (Signed)
per pof to sch pt appt-per pof pt aware °

## 2015-10-09 NOTE — Telephone Encounter (Signed)
Informed pt of appts available tomorrow.  She will come in at 8:30 am for lab and see Dr. Alvy Bimler at 9 am tomorrow.  POF sent.

## 2015-10-09 NOTE — Telephone Encounter (Signed)
VM message received form patient @ 9:47 am.  Return call to patient. She states she has developed swollen lymph nodes in her neck - right >left. And also palbale , tender lymph nodes in her axillary areas..  All are tender to touch. Denies fever, chills, cough etc.  Her next appt with Dr. Alvy Bimler is 10/30/15.  She would like to come in sooner.

## 2015-10-10 ENCOUNTER — Other Ambulatory Visit (HOSPITAL_BASED_OUTPATIENT_CLINIC_OR_DEPARTMENT_OTHER): Payer: BLUE CROSS/BLUE SHIELD

## 2015-10-10 ENCOUNTER — Ambulatory Visit (HOSPITAL_BASED_OUTPATIENT_CLINIC_OR_DEPARTMENT_OTHER): Payer: BLUE CROSS/BLUE SHIELD | Admitting: Hematology and Oncology

## 2015-10-10 ENCOUNTER — Encounter: Payer: Self-pay | Admitting: Hematology and Oncology

## 2015-10-10 VITALS — BP 169/72 | HR 51 | Temp 98.2°F | Resp 18 | Ht 67.0 in | Wt 194.7 lb

## 2015-10-10 DIAGNOSIS — C9110 Chronic lymphocytic leukemia of B-cell type not having achieved remission: Secondary | ICD-10-CM

## 2015-10-10 DIAGNOSIS — I10 Essential (primary) hypertension: Secondary | ICD-10-CM | POA: Diagnosis not present

## 2015-10-10 LAB — COMPREHENSIVE METABOLIC PANEL
ALT: 13 U/L (ref 0–55)
ANION GAP: 9 meq/L (ref 3–11)
AST: 21 U/L (ref 5–34)
Albumin: 4 g/dL (ref 3.5–5.0)
Alkaline Phosphatase: 50 U/L (ref 40–150)
BILIRUBIN TOTAL: 0.71 mg/dL (ref 0.20–1.20)
BUN: 11.4 mg/dL (ref 7.0–26.0)
CHLORIDE: 109 meq/L (ref 98–109)
CO2: 23 mEq/L (ref 22–29)
Calcium: 9.1 mg/dL (ref 8.4–10.4)
Creatinine: 0.9 mg/dL (ref 0.6–1.1)
EGFR: 89 mL/min/{1.73_m2} — ABNORMAL LOW (ref >90)
Glucose: 93 mg/dl (ref 70–140)
Potassium: 3.8 mEq/L (ref 3.5–5.1)
Sodium: 141 mEq/L (ref 136–145)
TOTAL PROTEIN: 7.3 g/dL (ref 6.4–8.3)

## 2015-10-10 LAB — CBC WITH DIFFERENTIAL/PLATELET
BASO%: 0.7 % (ref 0.0–2.0)
Basophils Absolute: 0.2 10*3/uL — ABNORMAL HIGH (ref 0.0–0.1)
EOS ABS: 0.2 10*3/uL (ref 0.0–0.5)
EOS%: 0.7 % (ref 0.0–7.0)
HCT: 36.6 % (ref 34.8–46.6)
HGB: 11.6 g/dL (ref 11.6–15.9)
LYMPH%: 81.1 % — ABNORMAL HIGH (ref 14.0–49.7)
MCH: 26.5 pg (ref 25.1–34.0)
MCHC: 31.8 g/dL (ref 31.5–36.0)
MCV: 83.4 fL (ref 79.5–101.0)
MONO#: 1.1 10*3/uL — ABNORMAL HIGH (ref 0.1–0.9)
MONO%: 3.7 % (ref 0.0–14.0)
NEUT#: 4.1 10*3/uL (ref 1.5–6.5)
NEUT%: 13.8 % — AB (ref 38.4–76.8)
Platelets: 198 10*3/uL (ref 145–400)
RBC: 4.39 10*6/uL (ref 3.70–5.45)
RDW: 15.8 % — ABNORMAL HIGH (ref 11.2–14.5)
WBC: 29.9 10*3/uL — ABNORMAL HIGH (ref 3.9–10.3)
lymph#: 24.2 10*3/uL — ABNORMAL HIGH (ref 0.9–3.3)

## 2015-10-10 LAB — LACTATE DEHYDROGENASE: LDH: 343 U/L — AB (ref 125–245)

## 2015-10-10 LAB — TECHNOLOGIST REVIEW

## 2015-10-10 MED ORDER — IBRUTINIB 140 MG PO CAPS: 420.0000 mg | ORAL_CAPSULE | Freq: Every day | ORAL | Status: DC

## 2015-10-10 MED FILL — *IMBRUVICA 140 MG CAPSULE: 140 | 30 days supply | Qty: 90 | Fill #0

## 2015-10-10 NOTE — Progress Notes (Signed)
[place] OFFICE PROGRESS NOTE  Patient Care Team: Janith Lima, MD as PCP - General (Internal Medicine)  SUMMARY OF ONCOLOGIC HISTORY:   CLL (chronic lymphocytic leukemia) ([place])   04/05/2015 Pathology Results Accession: [id number] flow cytometry confirmed CLL. FISH was positive for p53 mutation   04/24/2015 Imaging Extensive lymphadenopathy throughout the neck, chest (axilla), abdomen and pelvis, as detailed above, compatible with the reported clinical history of lymphoma. 2. Mild splenomegaly.   05/03/2015 -  Chemotherapy She started on Ibrutinib    INTERVAL HISTORY: Please see below for problem oriented charting. She is seen today because of new bilateral lymphadenopathy. The patient stopped Ibrutinib since the last time I saw her because her prescription ran out and she thought she was supposed to stop her treatment. Apart from the lymphadenopathy, she denies anorexia, abnormal weight loss or night sweats.  REVIEW OF SYSTEMS:   Constitutional: Denies fevers, chills or abnormal weight loss Eyes: Denies blurriness of vision Ears, nose, mouth, throat, and face: Denies mucositis or sore throat Respiratory: Denies cough, dyspnea or wheezes Cardiovascular: Denies palpitation, chest discomfort or lower extremity swelling Gastrointestinal:  Denies nausea, heartburn or change in bowel habits Skin: Denies abnormal skin rashes Neurological:Denies numbness, tingling or new weaknesses Behavioral/Psych: Mood is stable, no new changes  All other systems were reviewed with the patient and are negative.  I have reviewed the past medical history, past surgical history, social history and family history with the patient and they are unchanged from previous note.  ALLERGIES:  has No Known Allergies.  MEDICATIONS:  Current Outpatient Prescriptions  Medication Sig Dispense Refill  . hydrALAZINE (APRESOLINE) 50 MG tablet Take 1 tablet (50 mg total) by mouth 3 (three) times daily.  270 tablet 1  . nebivolol (BYSTOLIC) 10 MG tablet Take 1 tablet (10 mg total) by mouth daily. 90 tablet 3  . olmesartan-hydrochlorothiazide (BENICAR HCT) 40-25 MG per tablet Take 1 tablet by mouth daily.    . potassium chloride SA (K-DUR,KLOR-CON) 20 MEQ tablet Take 1 tablet (20 mEq total) by mouth 2 (two) times daily. 60 tablet 5  . ibrutinib (IMBRUVICA) 140 MG capsul Take 3 capsules (420 mg total) by mouth daily. (Patient not taking: Reported on 10/10/2015) 90 capsule 3   No current facility-administered medications for this visit.    PHYSICAL EXAMINATION: ECOG PERFORMANCE STATUS: 1 - Symptomatic but completely ambulatory  Filed Vitals:   10/10/15 0852  BP: 169/72  Pulse: 51  Temp: 98.2 F (36.8 C)  Resp: 18   Filed Weights   10/10/15 0852  Weight: 194 lb 11.2 oz (88.315 kg)    GENERAL:alert, no distress and comfortable SKIN: skin color, texture, turgor are normal, no rashes or significant lesions EYES: normal, Conjunctiva are pink and non-injected, sclera clear OROPHARYNX:no exudate, no erythema and lips, buccal mucosa, and tongue normal  NECK: supple, thyroid normal size, non-tender, without nodularity LYMPH:  She has new lymphadenopathy in both neck and both axilla. LUNGS: clear to auscultation and percussion with normal breathing effort HEART: regular rate & rhythm and no murmurs and no lower extremity edema ABDOMEN:abdomen soft, non-tender and normal bowel sounds she does not have palpable splenomegaly Musculoskeletal:no cyanosis of digits and no clubbing  NEURO: alert & oriented x 3 with fluent speech, no focal motor/sensory deficits  LABORATORY DATA:  I have reviewed the data as listed    Component Value Date/Time   NA 141 10/10/2015 0843   NA 141 10/03/2015 0836   K 3.8 10/10/2015 0843  K 3.9 10/03/2015 0836   CL 104 10/03/2015 0836   CL 105 01/18/2013 0912   CO2 23 10/10/2015 0843   CO2 26 10/03/2015 0836   GLUCOSE 93 10/10/2015 0843   GLUCOSE 87 10/03/2015  0836   GLUCOSE 83 01/18/2013 0912   BUN 11.4 10/10/2015 0843   BUN 16 10/03/2015 0836   CREATININE 0.9 10/10/2015 0843   CREATININE 0.84 10/03/2015 0836   CALCIUM 9.1 10/10/2015 0843   CALCIUM 9.7 10/03/2015 0836   PROT 7.3 10/10/2015 0843   PROT 7.4 03/29/2015 1419   ALBUMIN 4.0 10/10/2015 0843   ALBUMIN 4.4 03/29/2015 1419   AST 21 10/10/2015 0843   AST 18 03/29/2015 1419   ALT 13 10/10/2015 0843   ALT 17 03/29/2015 1419   ALKPHOS 50 10/10/2015 0843   ALKPHOS 69 03/29/2015 1419   BILITOT 0.71 10/10/2015 0843   BILITOT 0.8 03/29/2015 1419    No results found for: SPEP, UPEP  Lab Results  Component Value Date   WBC 29.9* 10/10/2015   NEUTROABS 4.1 10/10/2015   HGB 11.6 10/10/2015   HCT 36.6 10/10/2015   MCV 83.4 10/10/2015   PLT 198 10/10/2015      Chemistry      Component Value Date/Time   NA 141 10/10/2015 0843   NA 141 10/03/2015 0836   K 3.8 10/10/2015 0843   K 3.9 10/03/2015 0836   CL 104 10/03/2015 0836   CL 105 01/18/2013 0912   CO2 23 10/10/2015 0843   CO2 26 10/03/2015 0836   BUN 11.4 10/10/2015 0843   BUN 16 10/03/2015 0836   CREATININE 0.9 10/10/2015 0843   CREATININE 0.84 10/03/2015 0836      Component Value Date/Time   CALCIUM 9.1 10/10/2015 0843   CALCIUM 9.7 10/03/2015 0836   ALKPHOS 50 10/10/2015 0843   ALKPHOS 69 03/29/2015 1419   AST 21 10/10/2015 0843   AST 18 03/29/2015 1419   ALT 13 10/10/2015 0843   ALT 17 03/29/2015 1419   BILITOT 0.71 10/10/2015 0843   BILITOT 0.8 03/29/2015 1419      ASSESSMENT & PLAN:  CLL (chronic lymphocytic leukemia) She stopped treatment prematurely because she ran out of refills and did not call my office. There is no doubt her disease has relapsed since the last time I saw her. Due to her problems with compliance, I plan to see her on a regular basis from now on. I was able to give her 10 day sample and work on new prescription for renewal of Ibrutinib. At this point in time, I felt that were still  dealing with the same disease and I do not feel strongly that we need to perform another biopsy. If in her next visit, her lymph node is getting worse, we might have to restage her with PET CT scan and biopsy to exclude Richter's transformation.  Essential hypertension she will continue current medical management. I recommend close follow-up with primary care doctor for medication adjustment.      No orders of the defined types were placed in this encounter.   All questions were answered. The patient knows to call the clinic with any problems, questions or concerns. No barriers to learning was detected. I spent 15 minutes counseling the patient face to face. The total time spent in the appointment was 20 minutes and more than 50% was on counseling and review of test results     [facility], [place], MD 10/10/2015 9:39 AM

## 2015-10-10 NOTE — Assessment & Plan Note (Signed)
she will continue current medical management. I recommend close follow-up with primary care doctor for medication adjustment.  

## 2015-10-10 NOTE — Assessment & Plan Note (Signed)
She stopped treatment prematurely because she ran out of refills and did not call my office. There is no doubt her disease has relapsed since the last time I saw her. Due to her problems with compliance, I plan to see her on a regular basis from now on. I was able to give her 10 day sample and work on new prescription for renewal of Ibrutinib. At this point in time, I felt that were still dealing with the same disease and I do not feel strongly that we need to perform another biopsy. If in her next visit, her lymph node is getting worse, we might have to restage her with PET CT scan and biopsy to exclude Richter's transformation.

## 2015-10-29 ENCOUNTER — Other Ambulatory Visit: Payer: Self-pay | Admitting: Hematology and Oncology

## 2015-10-29 DIAGNOSIS — C9110 Chronic lymphocytic leukemia of B-cell type not having achieved remission: Secondary | ICD-10-CM

## 2015-10-30 ENCOUNTER — Other Ambulatory Visit (HOSPITAL_BASED_OUTPATIENT_CLINIC_OR_DEPARTMENT_OTHER): Payer: BLUE CROSS/BLUE SHIELD

## 2015-10-30 ENCOUNTER — Ambulatory Visit (HOSPITAL_BASED_OUTPATIENT_CLINIC_OR_DEPARTMENT_OTHER): Payer: BLUE CROSS/BLUE SHIELD | Admitting: Hematology and Oncology

## 2015-10-30 ENCOUNTER — Telehealth: Payer: Self-pay | Admitting: Hematology and Oncology

## 2015-10-30 ENCOUNTER — Encounter: Payer: Self-pay | Admitting: Hematology and Oncology

## 2015-10-30 VITALS — BP 175/84 | HR 65 | Temp 97.9°F | Resp 18 | Wt 194.9 lb

## 2015-10-30 DIAGNOSIS — I10 Essential (primary) hypertension: Secondary | ICD-10-CM

## 2015-10-30 DIAGNOSIS — K521 Toxic gastroenteritis and colitis: Secondary | ICD-10-CM | POA: Insufficient documentation

## 2015-10-30 DIAGNOSIS — R197 Diarrhea, unspecified: Secondary | ICD-10-CM

## 2015-10-30 DIAGNOSIS — C9110 Chronic lymphocytic leukemia of B-cell type not having achieved remission: Secondary | ICD-10-CM

## 2015-10-30 LAB — CBC WITH DIFFERENTIAL/PLATELET
BASO%: 0.6 % (ref 0.0–2.0)
BASOS ABS: 0.4 10*3/uL — AB (ref 0.0–0.1)
EOS%: 0.4 % (ref 0.0–7.0)
Eosinophils Absolute: 0.2 10*3/uL (ref 0.0–0.5)
HCT: 38.4 % (ref 34.8–46.6)
HEMOGLOBIN: 11.9 g/dL (ref 11.6–15.9)
LYMPH%: 92.8 % — AB (ref 14.0–49.7)
MCH: 26.1 pg (ref 25.1–34.0)
MCHC: 31 g/dL — AB (ref 31.5–36.0)
MCV: 84.4 fL (ref 79.5–101.0)
MONO#: 0.8 10*3/uL (ref 0.1–0.9)
MONO%: 1.4 % (ref 0.0–14.0)
NEUT#: 2.8 10*3/uL (ref 1.5–6.5)
NEUT%: 4.8 % — ABNORMAL LOW (ref 38.4–76.8)
Platelets: 268 10*3/uL (ref 145–400)
RBC: 4.55 10*6/uL (ref 3.70–5.45)
RDW: 15.6 % — AB (ref 11.2–14.5)
WBC: 59.5 10*3/uL (ref 3.9–10.3)
lymph#: 55.3 10*3/uL — ABNORMAL HIGH (ref 0.9–3.3)

## 2015-10-30 LAB — COMPREHENSIVE METABOLIC PANEL
ALT: 12 U/L (ref 0–55)
ANION GAP: 9 meq/L (ref 3–11)
AST: 18 U/L (ref 5–34)
Albumin: 4.4 g/dL (ref 3.5–5.0)
Alkaline Phosphatase: 49 U/L (ref 40–150)
BILIRUBIN TOTAL: 1.04 mg/dL (ref 0.20–1.20)
BUN: 13.5 mg/dL (ref 7.0–26.0)
CALCIUM: 9.2 mg/dL (ref 8.4–10.4)
CO2: 25 meq/L (ref 22–29)
CREATININE: 0.9 mg/dL (ref 0.6–1.1)
Chloride: 105 mEq/L (ref 98–109)
EGFR: 86 mL/min/{1.73_m2} — ABNORMAL LOW (ref >90)
Glucose: 95 mg/dl (ref 70–140)
Potassium: 3.7 mEq/L (ref 3.5–5.1)
SODIUM: 139 meq/L (ref 136–145)
TOTAL PROTEIN: 7.6 g/dL (ref 6.4–8.3)

## 2015-10-30 LAB — LACTATE DEHYDROGENASE: LDH: 282 U/L — AB (ref 125–245)

## 2015-10-30 LAB — TECHNOLOGIST REVIEW

## 2015-10-30 NOTE — Assessment & Plan Note (Signed)
She had mild frequent bowel movements since she resume Ibrutinib.  overall, it is mild grade 1 only. I will continue same treatment without dose adjustment.

## 2015-10-30 NOTE — Telephone Encounter (Signed)
per pof to sch pt appt-gave pt copy of avs °

## 2015-10-30 NOTE — Assessment & Plan Note (Signed)
The patient has been consistent taking Ibrutinib since the last time I saw her. She tolerated treatment well without major side effects. She is concerned about insurance coverage for the pills once her prescription runs out. I will ask for assistance from my pharmacist to make sure that she will get continuous supply of this. Clinically, she is responding to treatment. The elevated white blood cell count seen today is an expected response at the initial phase of treatment.  Due to history of noncompliance, I will bring her back on the monthly basis to ensure that she is taking all her medications as prescribed.

## 2015-10-30 NOTE — Progress Notes (Signed)
[facility] OFF[facility] PROGRESS NOTE  Patient Care Team: Janith Lima, MD as PCP - General (Internal Medicine)  SUMMARY OF ONCOLOGIC HISTORY:   CLL (chronic lymphocytic leukemia) ([place])   04/05/2015 Pathology Results Accession: [id number] flow cytometry confirmed CLL. FISH was positive for p53 mutation   04/24/2015 Imaging Extensive lymphadenopathy throughout the neck, chest (axilla), abdomen and pelvis, as detailed above, compatible with the reported clinical history of lymphoma. 2. Mild splenomegaly.   05/03/2015 - 08/27/2015 Chemotherapy She started on Ibrutinib, discontinued prematurely when her prescription ran out   10/10/2015 -  Chemotherapy She was restarted back on Ibrutinib    INTERVAL HISTORY: Please see below for problem oriented charting.  she returns for further follow-up. She is compliant taking all her medications. She had mild frequent bowel movement after she eats but denies major diarrhea, abdominal cramping or loss of appetite. She felt improvement of the lymphadenopathy with regression of size of the lymph nodes around the neck  Denies bruising or bleeding.  No recent infection.  REVIEW OF SYSTEMS:   Constitutional: Denies fevers, chills or abnormal weight loss Eyes: Denies blurriness of vision Ears, nose, mouth, throat, and face: Denies mucositis or sore throat Respiratory: Denies cough, dyspnea or wheezes Cardiovascular: Denies palpitation, chest discomfort or lower extremity swelling Skin: Denies abnormal skin rashes Lymphatics: Denies new lymphadenopathy or easy bruising Neurological:Denies numbness, tingling or new weaknesses Behavioral/Psych: Mood is stable, no new changes  All other systems were reviewed with the patient and are negative.  I have reviewed the past medical history, past surgical history, social history and family history with the patient and they are unchanged from previous note.  ALLERGIES:  has No Known  Allergies.  MEDICATIONS:  Current Outpatient Prescriptions  Medication Sig Dispense Refill  . hydrALAZINE (APRESOLINE) 50 MG tablet Take 1 tablet (50 mg total) by mouth 3 (three) times daily. 270 tablet 1  . ibrutinib (IMBRUVICA) 140 MG capsul Take 3 capsules (420 mg total) by mouth daily. 90 capsule 3  . nebivolol (BYSTOLIC) 10 MG tablet Take 1 tablet (10 mg total) by mouth daily. 90 tablet 3  . olmesartan-hydrochlorothiazide (BENICAR HCT) 40-25 MG per tablet Take 1 tablet by mouth daily.    . potassium chloride SA (K-DUR,KLOR-CON) 20 MEQ tablet Take 1 tablet (20 mEq total) by mouth 2 (two) times daily. 60 tablet 5   No current facility-administered medications for this visit.    PHYSICAL EXAMINATION: ECOG PERFORMANCE STATUS: 1 - Symptomatic but completely ambulatory  Filed Vitals:   10/30/15 0847 10/30/15 0848  BP: 180/98 175/84  Pulse: 65   Temp: 97.9 F (36.6 C)   Resp: 18    Filed Weights   10/30/15 0847  Weight: 194 lb 14.4 oz (88.406 kg)    GENERAL:alert, no distress and comfortable SKIN: skin color, texture, turgor are normal, no rashes or significant lesions EYES: normal, Conjunctiva are pink and non-injected, sclera clear OROPHARYNX:no exudate, no erythema and lips, buccal mucosa, and tongue normal  NECK: supple, thyroid normal size, non-tender, without nodularity LYMPH:   Most of the lymph nodes around the neck had regressed in size. LUNGS: clear to auscultation and percussion with normal breathing effort HEART: regular rate & rhythm and no murmurs and no lower extremity edema ABDOMEN:abdomen soft, non-tender and normal bowel sounds Musculoskeletal:no cyanosis of digits and no clubbing  NEURO: alert & oriented x 3 with fluent speech, no focal motor/sensory deficits  LABORATORY DATA:  I have reviewed the data as listed  Component Value Date/Time   NA 139 10/30/2015 0835   NA 141 10/03/2015 0836   K 3.7 10/30/2015 0835   K 3.9 10/03/2015 0836   CL 104  10/03/2015 0836   CL 105 01/18/2013 0912   CO2 25 10/30/2015 0835   CO2 26 10/03/2015 0836   GLUCOSE 95 10/30/2015 0835   GLUCOSE 87 10/03/2015 0836   GLUCOSE 83 01/18/2013 0912   BUN 13.5 10/30/2015 0835   BUN 16 10/03/2015 0836   CREATININE 0.9 10/30/2015 0835   CREATININE 0.84 10/03/2015 0836   CALCIUM 9.2 10/30/2015 0835   CALCIUM 9.7 10/03/2015 0836   PROT 7.6 10/30/2015 0835   PROT 7.4 03/29/2015 1419   ALBUMIN 4.4 10/30/2015 0835   ALBUMIN 4.4 03/29/2015 1419   AST 18 10/30/2015 0835   AST 18 03/29/2015 1419   ALT 12 10/30/2015 0835   ALT 17 03/29/2015 1419   ALKPHOS 49 10/30/2015 0835   ALKPHOS 69 03/29/2015 1419   BILITOT 1.04 10/30/2015 0835   BILITOT 0.8 03/29/2015 1419    No results found for: SPEP, UPEP  Lab Results  Component Value Date   WBC 59.5* 10/30/2015   NEUTROABS 2.8 10/30/2015   HGB 11.9 10/30/2015   HCT 38.4 10/30/2015   MCV 84.4 10/30/2015   PLT 268 10/30/2015      Chemistry      Component Value Date/Time   NA 139 10/30/2015 0835   NA 141 10/03/2015 0836   K 3.7 10/30/2015 0835   K 3.9 10/03/2015 0836   CL 104 10/03/2015 0836   CL 105 01/18/2013 0912   CO2 25 10/30/2015 0835   CO2 26 10/03/2015 0836   BUN 13.5 10/30/2015 0835   BUN 16 10/03/2015 0836   CREATININE 0.9 10/30/2015 0835   CREATININE 0.84 10/03/2015 0836      Component Value Date/Time   CALCIUM 9.2 10/30/2015 0835   CALCIUM 9.7 10/03/2015 0836   ALKPHOS 49 10/30/2015 0835   ALKPHOS 69 03/29/2015 1419   AST 18 10/30/2015 0835   AST 18 03/29/2015 1419   ALT 12 10/30/2015 0835   ALT 17 03/29/2015 1419   BILITOT 1.04 10/30/2015 0835   BILITOT 0.8 03/29/2015 1419      ASSESSMENT & PLAN:  CLL (chronic lymphocytic leukemia) The patient has been consistent taking Ibrutinib since the last time I saw her. She tolerated treatment well without major side effects. She is concerned about insurance coverage for the pills once her prescription runs out. I will ask for  assistance from my pharmacist to make sure that she will get continuous supply of this. Clinically, she is responding to treatment. The elevated white blood cell count seen today is an expected response at the initial phase of treatment.  Due to history of noncompliance, I will bring her back on the monthly basis to ensure that she is taking all her medications as prescribed.  Essential hypertension she will continue current medical management. I recommend close follow-up with primary care doctor for medication adjustment.   Diarrhea due to drug  She had mild frequent bowel movements since she resume Ibrutinib.  overall, it is mild grade 1 only. I will continue same treatment without dose adjustment.   No orders of the defined types were placed in this encounter.   All questions were answered. The patient knows to call the clinic with any problems, questions or concerns. No barriers to learning was detected. I spent 15 minutes counseling the patient face to face. The total  time spent in the appointment was 20 minutes and more than 50% was on counseling and review of test results     [facility], [place], MD 10/30/2015 9:20 AM

## 2015-10-30 NOTE — Assessment & Plan Note (Signed)
she will continue current medical management. I recommend close follow-up with primary care doctor for medication adjustment.  

## 2015-11-08 ENCOUNTER — Telehealth: Payer: Self-pay | Admitting: Pharmacist

## 2015-11-08 NOTE — Telephone Encounter (Signed)
11/08/15: Attempted to reach patient for follow up on oral medication: Imbruvica. No answer. Left VM on both patient's home and cell phone for patient to call back with any questions or issues.   Thank you,  Montel Clock, PharmD, [facility] [phone]

## 2015-11-22 MED FILL — *IMBRUVICA 140 MG CAPSULE: 140 | 30 days supply | Qty: 90 | Fill #1

## 2015-11-28 ENCOUNTER — Other Ambulatory Visit: Payer: Self-pay | Admitting: Hematology and Oncology

## 2015-11-28 DIAGNOSIS — C9110 Chronic lymphocytic leukemia of B-cell type not having achieved remission: Secondary | ICD-10-CM

## 2015-11-29 ENCOUNTER — Ambulatory Visit (HOSPITAL_BASED_OUTPATIENT_CLINIC_OR_DEPARTMENT_OTHER): Payer: BLUE CROSS/BLUE SHIELD | Admitting: Hematology and Oncology

## 2015-11-29 ENCOUNTER — Other Ambulatory Visit (HOSPITAL_BASED_OUTPATIENT_CLINIC_OR_DEPARTMENT_OTHER): Payer: BLUE CROSS/BLUE SHIELD

## 2015-11-29 ENCOUNTER — Telehealth: Payer: Self-pay | Admitting: Hematology and Oncology

## 2015-11-29 ENCOUNTER — Encounter: Payer: Self-pay | Admitting: Hematology and Oncology

## 2015-11-29 VITALS — BP 189/57 | HR 68 | Temp 97.5°F | Resp 18 | Ht 67.0 in | Wt 194.9 lb

## 2015-11-29 DIAGNOSIS — C9110 Chronic lymphocytic leukemia of B-cell type not having achieved remission: Secondary | ICD-10-CM

## 2015-11-29 DIAGNOSIS — I10 Essential (primary) hypertension: Secondary | ICD-10-CM | POA: Diagnosis not present

## 2015-11-29 LAB — CBC WITH DIFFERENTIAL/PLATELET
BASO%: 0.3 % (ref 0.0–2.0)
Basophils Absolute: 0.2 10*3/uL — ABNORMAL HIGH (ref 0.0–0.1)
EOS ABS: 0.1 10*3/uL (ref 0.0–0.5)
EOS%: 0.1 % (ref 0.0–7.0)
HCT: 38.9 % (ref 34.8–46.6)
HEMOGLOBIN: 12 g/dL (ref 11.6–15.9)
LYMPH%: 91.9 % — ABNORMAL HIGH (ref 14.0–49.7)
MCH: 26 pg (ref 25.1–34.0)
MCHC: 30.7 g/dL — ABNORMAL LOW (ref 31.5–36.0)
MCV: 84.7 fL (ref 79.5–101.0)
MONO#: 1.5 10*3/uL — ABNORMAL HIGH (ref 0.1–0.9)
MONO%: 2.1 % (ref 0.0–14.0)
NEUT%: 5.6 % — ABNORMAL LOW (ref 38.4–76.8)
NEUTROS ABS: 4.1 10*3/uL (ref 1.5–6.5)
Platelets: 225 10*3/uL (ref 145–400)
RBC: 4.6 10*6/uL (ref 3.70–5.45)
RDW: 15.2 % — AB (ref 11.2–14.5)
WBC: 73.6 10*3/uL — AB (ref 3.9–10.3)
lymph#: 67.7 10*3/uL — ABNORMAL HIGH (ref 0.9–3.3)

## 2015-11-29 LAB — COMPREHENSIVE METABOLIC PANEL
ALBUMIN: 4.2 g/dL (ref 3.5–5.0)
ALK PHOS: 49 U/L (ref 40–150)
ALT: 14 U/L (ref 0–55)
AST: 15 U/L (ref 5–34)
Anion Gap: 10 mEq/L (ref 3–11)
BILIRUBIN TOTAL: 1.41 mg/dL — AB (ref 0.20–1.20)
BUN: 9.9 mg/dL (ref 7.0–26.0)
CO2: 24 meq/L (ref 22–29)
Calcium: 9.2 mg/dL (ref 8.4–10.4)
Chloride: 108 mEq/L (ref 98–109)
Creatinine: 0.9 mg/dL (ref 0.6–1.1)
EGFR: 88 mL/min/{1.73_m2} — ABNORMAL LOW (ref >90)
GLUCOSE: 97 mg/dL (ref 70–140)
Potassium: 3.8 mEq/L (ref 3.5–5.1)
SODIUM: 142 meq/L (ref 136–145)
TOTAL PROTEIN: 7.4 g/dL (ref 6.4–8.3)

## 2015-11-29 LAB — TECHNOLOGIST REVIEW

## 2015-11-29 MED ORDER — AMLODIPINE BESYLATE 10 MG PO TABS: 10.0000 mg | ORAL_TABLET | Freq: Every day | ORAL | Status: DC

## 2015-11-29 NOTE — Assessment & Plan Note (Signed)
The patient has been consistent taking Ibrutinib since the last time I saw her. She tolerated treatment well without major side effects. Clinically, she is responding to treatment. The elevated white blood cell count seen today is an expected response at the initial phase of treatment. Lymphadenopathy had resolved I will see her in 2 months for further review

## 2015-11-29 NOTE — Assessment & Plan Note (Signed)
Her blood pressure is poorly controlled. I will add amlodipine and recommend she follows closely with her primary care doctor for medication adjustment

## 2015-11-29 NOTE — Progress Notes (Signed)
[facility] PROGRESS NOTE  Patient Care Team: Janith Lima, MD as PCP - General (Internal Medicine)  SUMMARY OF ONCOLOGIC HISTORY:   CLL (chronic lymphocytic leukemia) ([place])   04/05/2015 Pathology Results Accession: [id number] flow cytometry confirmed CLL. FISH was positive for p53 mutation   04/24/2015 Imaging Extensive lymphadenopathy throughout the neck, chest (axilla), abdomen and pelvis, as detailed above, compatible with the reported clinical history of lymphoma. 2. Mild splenomegaly.   05/03/2015 - 08/27/2015 Chemotherapy She started on Ibrutinib, discontinued prematurely when her prescription ran out   10/10/2015 -  Chemotherapy She was restarted back on Ibrutinib    INTERVAL HISTORY: Please see below for problem oriented charting. She feels well. Denies recent infection. No diarrheal or other side effects from treatment No new lymphadenopathy  REVIEW OF SYSTEMS:   Constitutional: Denies fevers, chills or abnormal weight loss Eyes: Denies blurriness of vision Ears, nose, mouth, throat, and face: Denies mucositis or sore throat Respiratory: Denies cough, dyspnea or wheezes Cardiovascular: Denies palpitation, chest discomfort or lower extremity swelling Gastrointestinal:  Denies nausea, heartburn or change in bowel habits Skin: Denies abnormal skin rashes Lymphatics: Denies new lymphadenopathy or easy bruising Neurological:Denies numbness, tingling or new weaknesses Behavioral/Psych: Mood is stable, no new changes  All other systems were reviewed with the patient and are negative.  I have reviewed the past medical history, past surgical history, social history and family history with the patient and they are unchanged from previous note.  ALLERGIES:  has No Known Allergies.  MEDICATIONS:  Current Outpatient Prescriptions  Medication Sig Dispense Refill  . hydrALAZINE (APRESOLINE) 50 MG tablet Take 1 tablet (50 mg total) by mouth 3 (three) times daily.  270 tablet 1  . ibrutinib (IMBRUVICA) 140 MG capsul Take 3 capsules (420 mg total) by mouth daily. 90 capsule 3  . nebivolol (BYSTOLIC) 10 MG tablet Take 1 tablet (10 mg total) by mouth daily. 90 tablet 3  . potassium chloride SA (K-DUR,KLOR-CON) 20 MEQ tablet Take 1 tablet (20 mEq total) by mouth 2 (two) times daily. 60 tablet 5  . amLODipine (NORVASC) 10 MG tablet Take 1 tablet (10 mg total) by mouth daily. 30 tablet 1   No current facility-administered medications for this visit.    PHYSICAL EXAMINATION: ECOG PERFORMANCE STATUS: 0 - Asymptomatic  Filed Vitals:   11/29/15 0919  BP: 189/57  Pulse: 68  Temp: 97.5 F (36.4 C)  Resp: 18   Filed Weights   11/29/15 0919  Weight: 194 lb 14.4 oz (88.406 kg)    GENERAL:alert, no distress and comfortable SKIN: skin color, texture, turgor are normal, no rashes or significant lesions EYES: normal, Conjunctiva are pink and non-injected, sclera clear OROPHARYNX:no exudate, no erythema and lips, buccal mucosa, and tongue normal  NECK: supple, thyroid normal size, non-tender, without nodularity LYMPH:  no palpable lymphadenopathy in the cervical, axillary or inguinal LUNGS: clear to auscultation and percussion with normal breathing effort HEART: regular rate & rhythm and no murmurs and no lower extremity edema ABDOMEN:abdomen soft, non-tender and normal bowel sounds Musculoskeletal:no cyanosis of digits and no clubbing  NEURO: alert & oriented x 3 with fluent speech, no focal motor/sensory deficits  LABORATORY DATA:  I have reviewed the data as listed    Component Value Date/Time   NA 139 10/30/2015 0835   NA 141 10/03/2015 0836   K 3.7 10/30/2015 0835   K 3.9 10/03/2015 0836   CL 104 10/03/2015 0836   CL 105 01/18/2013 0912  CO2 25 10/30/2015 0835   CO2 26 10/03/2015 0836   GLUCOSE 95 10/30/2015 0835   GLUCOSE 87 10/03/2015 0836   GLUCOSE 83 01/18/2013 0912   BUN 13.5 10/30/2015 0835   BUN 16 10/03/2015 0836   CREATININE 0.9  10/30/2015 0835   CREATININE 0.84 10/03/2015 0836   CALCIUM 9.2 10/30/2015 0835   CALCIUM 9.7 10/03/2015 0836   PROT 7.6 10/30/2015 0835   PROT 7.4 03/29/2015 1419   ALBUMIN 4.4 10/30/2015 0835   ALBUMIN 4.4 03/29/2015 1419   AST 18 10/30/2015 0835   AST 18 03/29/2015 1419   ALT 12 10/30/2015 0835   ALT 17 03/29/2015 1419   ALKPHOS 49 10/30/2015 0835   ALKPHOS 69 03/29/2015 1419   BILITOT 1.04 10/30/2015 0835   BILITOT 0.8 03/29/2015 1419    No results found for: SPEP, UPEP  Lab Results  Component Value Date   WBC 73.6* 11/29/2015   NEUTROABS 4.1 11/29/2015   HGB 12.0 11/29/2015   HCT 38.9 11/29/2015   MCV 84.7 11/29/2015   PLT 225 11/29/2015      Chemistry      Component Value Date/Time   NA 139 10/30/2015 0835   NA 141 10/03/2015 0836   K 3.7 10/30/2015 0835   K 3.9 10/03/2015 0836   CL 104 10/03/2015 0836   CL 105 01/18/2013 0912   CO2 25 10/30/2015 0835   CO2 26 10/03/2015 0836   BUN 13.5 10/30/2015 0835   BUN 16 10/03/2015 0836   CREATININE 0.9 10/30/2015 0835   CREATININE 0.84 10/03/2015 0836      Component Value Date/Time   CALCIUM 9.2 10/30/2015 0835   CALCIUM 9.7 10/03/2015 0836   ALKPHOS 49 10/30/2015 0835   ALKPHOS 69 03/29/2015 1419   AST 18 10/30/2015 0835   AST 18 03/29/2015 1419   ALT 12 10/30/2015 0835   ALT 17 03/29/2015 1419   BILITOT 1.04 10/30/2015 0835   BILITOT 0.8 03/29/2015 1419     SSESSMENT & PLAN:  CLL (chronic lymphocytic leukemia) The patient has been consistent taking Ibrutinib since the last time I saw her. She tolerated treatment well without major side effects. Clinically, she is responding to treatment. The elevated white blood cell count seen today is an expected response at the initial phase of treatment. Lymphadenopathy had resolved I will see her in 2 months for further review   Essential hypertension Her blood pressure is poorly controlled. I will add amlodipine and recommend she follows closely with her  primary care doctor for medication adjustment   No orders of the defined types were placed in this encounter.   All questions were answered. The patient knows to call the clinic with any problems, questions or concerns. No barriers to learning was detected. I spent 15 minutes counseling the patient face to face. The total time spent in the appointment was 20 minutes and more than 50% was on counseling and review of test results     [facility], [place], MD 11/29/2015 9:28 AM

## 2015-11-29 NOTE — Telephone Encounter (Signed)
per pof to sch pt appt-gave pt copy of avs °

## 2015-12-07 ENCOUNTER — Telehealth: Payer: Self-pay | Admitting: Pharmacist

## 2015-12-07 NOTE — Telephone Encounter (Signed)
12/07/15: Attempted to reach patient for follow up on oral medication: Imbruvica. No answer. Left VM for patient to call back with any questions or issues.   Thank you,  Montel Clock, PharmD, [facility] [phone]

## 2015-12-24 MED FILL — *IMBRUVICA 140 MG CAPSULE: 140 | 30 days supply | Qty: 90 | Fill #2

## 2016-01-01 ENCOUNTER — Ambulatory Visit (INDEPENDENT_AMBULATORY_CARE_PROVIDER_SITE_OTHER): Payer: BLUE CROSS/BLUE SHIELD | Admitting: Internal Medicine

## 2016-01-01 ENCOUNTER — Encounter: Payer: Self-pay | Admitting: Internal Medicine

## 2016-01-01 VITALS — BP 128/76 | HR 55 | Temp 98.4°F | Resp 16 | Ht 67.0 in | Wt 200.0 lb

## 2016-01-01 DIAGNOSIS — I480 Paroxysmal atrial fibrillation: Secondary | ICD-10-CM | POA: Insufficient documentation

## 2016-01-01 DIAGNOSIS — R002 Palpitations: Secondary | ICD-10-CM | POA: Diagnosis not present

## 2016-01-01 DIAGNOSIS — R011 Cardiac murmur, unspecified: Secondary | ICD-10-CM | POA: Diagnosis not present

## 2016-01-01 MED ORDER — NEBIVOLOL HCL 5 MG PO TABS: 5.0000 mg | ORAL_TABLET | Freq: Every day | ORAL | Status: DC

## 2016-01-01 NOTE — Patient Instructions (Signed)

## 2016-01-01 NOTE — Progress Notes (Signed)
Pre visit review using our clinic review tool, if applicable. No additional management support is needed unless otherwise documented below in the visit note. 

## 2016-01-01 NOTE — Progress Notes (Signed)
Subjective:  Patient ID: Audrey Gross, female    DOB: 1961-08-15  Age: 55 y.o. MRN: [id number]  CC: Hypertension   HPI Audrey Gross presents for a blood pressure check. She complains of palpitations for several weeks. She describes it as a sensation that her heart beat is irregular and rapid. The palpitations usually occur at rest and do not affect her activity level. She denies chest pain, shortness of breath, dyspnea on exertion, edema, dizziness, syncope, or near-syncope.  Outpatient Prescriptions Prior to Visit  Medication Sig Dispense Refill  . amLODipine (NORVASC) 10 MG tablet Take 1 tablet (10 mg total) by mouth daily. 30 tablet 1  . hydrALAZINE (APRESOLINE) 50 MG tablet Take 1 tablet (50 mg total) by mouth 3 (three) times daily. 270 tablet 1  . ibrutinib (IMBRUVICA) 140 MG capsul Take 3 capsules (420 mg total) by mouth daily. 90 capsule 3  . potassium chloride SA (K-DUR,KLOR-CON) 20 MEQ tablet Take 1 tablet (20 mEq total) by mouth 2 (two) times daily. 60 tablet 5  . nebivolol (BYSTOLIC) 10 MG tablet Take 1 tablet (10 mg total) by mouth daily. 90 tablet 3   No facility-administered medications prior to visit.    ROS Review of Systems  Constitutional: Negative.  Negative for fever, chills, diaphoresis, appetite change and fatigue.  HENT: Negative.  Negative for trouble swallowing.   Eyes: Negative.  Negative for visual disturbance.  Respiratory: Negative.  Negative for cough, choking, chest tightness, shortness of breath and stridor.   Cardiovascular: Positive for palpitations. Negative for chest pain and leg swelling.  Gastrointestinal: Negative for nausea, vomiting, abdominal pain, diarrhea, constipation and blood in stool.  Endocrine: Negative.   Genitourinary: Negative.   Musculoskeletal: Negative.  Negative for back pain and neck pain.  Skin: Negative.  Negative for color change and rash.  Allergic/Immunologic: Negative.   Neurological: Negative.  Negative for  dizziness, tremors, weakness, light-headedness, numbness and headaches.  Hematological: Negative.  Negative for adenopathy. Does not bruise/bleed easily.  Psychiatric/Behavioral: Negative.     Objective:  BP 128/76 mmHg  Pulse 55  Temp(Src) 98.4 F (36.9 C) (Oral)  Resp 16  Ht 5\' 7"  (1.702 m)  Wt 200 lb (90.719 kg)  BMI 31.32 kg/m2  SpO2 96%  LMP 07/20/2013  BP Readings from Last 3 Encounters:  01/01/16 128/76  11/29/15 189/57  10/30/15 175/84    Wt Readings from Last 3 Encounters:  01/01/16 200 lb (90.719 kg)  11/29/15 194 lb 14.4 oz (88.406 kg)  10/30/15 194 lb 14.4 oz (88.406 kg)    Physical Exam  Constitutional: She is oriented to person, place, and time. No distress.  HENT:  Mouth/Throat: Oropharynx is clear and moist. No oropharyngeal exudate.  Eyes: Conjunctivae are normal. Right eye exhibits no discharge. Left eye exhibits no discharge. No scleral icterus.  Neck: Normal range of motion. Neck supple. No JVD present. No tracheal deviation present. No thyromegaly present.  Cardiovascular: Normal rate, regular rhythm, S1 normal, S2 normal and intact distal pulses.  Exam reveals no gallop and no friction rub.   Murmur heard.  Systolic murmur is present with a grade of 2/6   No diastolic murmur is present  Pulses:      Carotid pulses are 1+ on the right side, and 1+ on the left side.      Radial pulses are 1+ on the right side, and 1+ on the left side.       Femoral pulses are 1+ on the right side,  and 1+ on the left side.      Popliteal pulses are 1+ on the right side, and 1+ on the left side.       Dorsalis pedis pulses are 1+ on the right side, and 1+ on the left side.       Posterior tibial pulses are 1+ on the right side, and 1+ on the left side.  2/6 SEM heard throughout the precordium  EKG --  Sinus  Bradycardia  Voltage criteria for LVH  (R(I)+S(III) exceeds 2.50 mV)  -Voltage criteria w/o ST/T abnormality may be normal.   -  Negative T-waves  -Possible   Inferior  ischemia.   ABNORMAL   Pulmonary/Chest: Effort normal and breath sounds normal. No stridor. No respiratory distress. She has no wheezes. She has no rales. She exhibits no tenderness.  Abdominal: Soft. Bowel sounds are normal. She exhibits no distension and no mass. There is no tenderness. There is no rebound and no guarding.  Musculoskeletal: Normal range of motion. She exhibits no edema or tenderness.  Lymphadenopathy:    She has no cervical adenopathy.  Neurological: She is oriented to person, place, and time.  Skin: Skin is warm and dry. No rash noted. She is not diaphoretic. No erythema. No pallor.    Lab Results  Component Value Date   WBC 73.6* 11/29/2015   HGB 12.0 11/29/2015   HCT 38.9 11/29/2015   PLT 225 11/29/2015   GLUCOSE 97 11/29/2015   CHOL 177 03/29/2015   TRIG 138.0 03/29/2015   HDL 37.40* 03/29/2015   LDLDIRECT 144.5 07/26/2013   LDLCALC 112* 03/29/2015   ALT 14 11/29/2015   AST 15 11/29/2015   NA 142 11/29/2015   K 3.8 11/29/2015   CL 104 10/03/2015   CREATININE 0.9 11/29/2015   BUN 9.9 11/29/2015   CO2 24 11/29/2015   TSH 0.82 03/29/2015    US Aspiration  04/26/2015  CLINICAL DATA:  Right breast 10 o'clock complicated cysts. EXAM: ULTRASOUND GUIDED RIGHT BREAST CYST ASPIRATION COMPARISON:  Previous exams. PROCEDURE: Using sterile technique, 1% lidocaine, under direct ultrasound visualization, needle aspiration of 2 right breast 10 o'clock cysts was performed. 8 cc of benign-type fluid was aspirated and discarded. Patient tolerated the procedure well. IMPRESSION: Ultrasound-guided aspiration of the right breast No apparent complications. RECOMMENDATIONS: Annual screening mammography is recommended, as per the diagnostic report. Electronically Signed   By: Fidela Salisbury M.D.   On: 04/26/2015 10:10    Assessment & Plan:   Tony was seen today for hypertension.  Diagnoses and all orders for this visit:  Palpitations- her description of the  palpitations and the other symptomatology sounds benign. She has a low heart rate today so will decrease her Bystolic dose from 10 mg a day to 5 mg a day. I've asked her to undergo a Holter monitor to see if she is having significant pauses or any other dysrhythmias that should be evaluated. -     EKG 12-Lead -     Holter monitor - 48 hour; Future -     nebivolol (BYSTOLIC) 5 MG tablet; Take 1 tablet (5 mg total) by mouth daily. -     ECHOCARDIOGRAM COMPLETE; Future  Heart murmur, systolic- she complains of palpitations and her murmur is more prominent, it's been about 4 years since her last echocardiogram which was rather unremarkable, since she is now having palpitations I have asked her to undergo another echocardiogram to see if she has developed structural heart disease and to check  her ejection fraction. -     ECHOCARDIOGRAM COMPLETE; Future   I have discontinued Ms. Ailey's nebivolol. I am also having her start on nebivolol. Additionally, I am having her maintain her potassium chloride SA, hydrALAZINE, ibrutinib, and amLODipine.  Meds ordered this encounter  Medications  . nebivolol (BYSTOLIC) 5 MG tablet    Sig: Take 1 tablet (5 mg total) by mouth daily.    Dispense:  30 tablet    Refill:  5     Follow-up: Return in about 3 months (around 04/02/2016).  [name], MD

## 2016-01-04 ENCOUNTER — Telehealth: Payer: Self-pay | Admitting: Pharmacist

## 2016-01-04 NOTE — Telephone Encounter (Signed)
Attempted calling pt at home and cell# to discuss Imbruvica. Cell phone: vm not set up. Home phone: no answer.  Kennith Center, Pharm.D., CPP 01/04/2016@3 :04 PM [facility]

## 2016-01-07 ENCOUNTER — Encounter: Payer: Self-pay | Admitting: Pharmacist

## 2016-01-07 NOTE — Progress Notes (Signed)
Oral Chemotherapy Follow-Up Form  Original Start date of oral chemotherapy: _7/28/16___   Called patient today to follow up regarding patient's oral chemotherapy medication: _Imbruvica__  Pt is doing well today. She has been much more compliant since restarting Imbruvica. Her issue is that she is difficult to reach. Finally reached patient on home phone today. Patient reports taking imbruvica incorrectly (1 capsules three times daily vs 3 capsules once daily). Instructed patient to start taking medication as instructed 3 capsules once daily. Patient voiced understanding and will change to 3 capsules once daily. Will monitor closely for compliance  Pt reports _0___ tablets/doses missed in the last week/month.    Pt reports the following side effects: _none____   Will follow up and call patient again in _3 weeks___   Thank you,  Montel Clock, PharmD, [facility]

## 2016-01-24 MED FILL — *IMBRUVICA 140 MG CAPSULE: 140 | 30 days supply | Qty: 90 | Fill #3

## 2016-01-28 ENCOUNTER — Telehealth: Payer: Self-pay | Admitting: Pharmacist

## 2016-01-28 NOTE — Telephone Encounter (Signed)
01/28/16: Attempted calling pt at home and cell# to discuss Imbruvica. Cell phone: vm not set up. Home phone: no answer.  Thank you,  Montel Clock, PharmD, [facility]

## 2016-01-29 ENCOUNTER — Telehealth: Payer: Self-pay | Admitting: Hematology and Oncology

## 2016-01-29 ENCOUNTER — Other Ambulatory Visit (HOSPITAL_BASED_OUTPATIENT_CLINIC_OR_DEPARTMENT_OTHER): Payer: BLUE CROSS/BLUE SHIELD

## 2016-01-29 ENCOUNTER — Ambulatory Visit (HOSPITAL_BASED_OUTPATIENT_CLINIC_OR_DEPARTMENT_OTHER): Payer: BLUE CROSS/BLUE SHIELD | Admitting: Hematology and Oncology

## 2016-01-29 ENCOUNTER — Encounter: Payer: Self-pay | Admitting: Hematology and Oncology

## 2016-01-29 VITALS — BP 159/77 | HR 58 | Temp 97.9°F | Resp 18 | Ht 67.0 in | Wt 199.6 lb

## 2016-01-29 DIAGNOSIS — C9110 Chronic lymphocytic leukemia of B-cell type not having achieved remission: Secondary | ICD-10-CM

## 2016-01-29 DIAGNOSIS — I10 Essential (primary) hypertension: Secondary | ICD-10-CM

## 2016-01-29 LAB — CBC WITH DIFFERENTIAL/PLATELET
BASO%: 0.6 % (ref 0.0–2.0)
BASOS ABS: 0.3 10*3/uL — AB (ref 0.0–0.1)
EOS ABS: 0 10*3/uL (ref 0.0–0.5)
EOS%: 0.1 % (ref 0.0–7.0)
HEMATOCRIT: 39.9 % (ref 34.8–46.6)
HGB: 12.3 g/dL (ref 11.6–15.9)
LYMPH#: 40.4 10*3/uL — AB (ref 0.9–3.3)
LYMPH%: 87.2 % — ABNORMAL HIGH (ref 14.0–49.7)
MCH: 26.4 pg (ref 25.1–34.0)
MCHC: 30.9 g/dL — AB (ref 31.5–36.0)
MCV: 85.5 fL (ref 79.5–101.0)
MONO#: 1 10*3/uL — AB (ref 0.1–0.9)
MONO%: 2.2 % (ref 0.0–14.0)
NEUT#: 4.6 10*3/uL (ref 1.5–6.5)
NEUT%: 9.9 % — AB (ref 38.4–76.8)
PLATELETS: 224 10*3/uL (ref 145–400)
RBC: 4.66 10*6/uL (ref 3.70–5.45)
RDW: 15.2 % — ABNORMAL HIGH (ref 11.2–14.5)
WBC: 46.3 10*3/uL — ABNORMAL HIGH (ref 3.9–10.3)

## 2016-01-29 LAB — COMPREHENSIVE METABOLIC PANEL
ALK PHOS: 49 U/L (ref 40–150)
ALT: 10 U/L (ref 0–55)
ANION GAP: 10 meq/L (ref 3–11)
AST: 16 U/L (ref 5–34)
Albumin: 4.1 g/dL (ref 3.5–5.0)
BUN: 12.6 mg/dL (ref 7.0–26.0)
CALCIUM: 9.7 mg/dL (ref 8.4–10.4)
CHLORIDE: 107 meq/L (ref 98–109)
CO2: 24 mEq/L (ref 22–29)
Creatinine: 0.9 mg/dL (ref 0.6–1.1)
EGFR: 88 mL/min/{1.73_m2} — ABNORMAL LOW (ref >90)
Glucose: 82 mg/dl (ref 70–140)
POTASSIUM: 3.9 meq/L (ref 3.5–5.1)
Sodium: 142 mEq/L (ref 136–145)
Total Bilirubin: 1.09 mg/dL (ref 0.20–1.20)
Total Protein: 7.4 g/dL (ref 6.4–8.3)

## 2016-01-29 LAB — TECHNOLOGIST REVIEW

## 2016-01-29 NOTE — Progress Notes (Signed)
[facility] OFFICE PROGRESS NOTE  Patient Care Team: Janith Lima, MD as PCP - General (Internal Medicine)  SUMMARY OF ONCOLOGIC HISTORY:   CLL (chronic lymphocytic leukemia) ([place])   04/05/2015 Pathology Results Accession: [id number] flow cytometry confirmed CLL. FISH was positive for p53 mutation   04/24/2015 Imaging Extensive lymphadenopathy throughout the neck, chest (axilla), abdomen and pelvis, as detailed above, compatible with the reported clinical history of lymphoma. 2. Mild splenomegaly.   05/03/2015 - 08/27/2015 Chemotherapy She started on Ibrutinib, discontinued prematurely when her prescription ran out   10/10/2015 -  Chemotherapy She was restarted back on Ibrutinib    INTERVAL HISTORY: Please see below for problem oriented charting. She returns for further follow-up. She is compliant taking all her medications She denies diarrhea No new lymphadenopathy.  REVIEW OF SYSTEMS:   Constitutional: Denies fevers, chills or abnormal weight loss Eyes: Denies blurriness of vision Ears, nose, mouth, throat, and face: Denies mucositis or sore throat Respiratory: Denies cough, dyspnea or wheezes Cardiovascular: Denies palpitation, chest discomfort or lower extremity swelling Gastrointestinal:  Denies nausea, heartburn or change in bowel habits Skin: Denies abnormal skin rashes Lymphatics: Denies new lymphadenopathy or easy bruising Neurological:Denies numbness, tingling or new weaknesses Behavioral/Psych: Mood is stable, no new changes  All other systems were reviewed with the patient and are negative.  I have reviewed the past medical history, past surgical history, social history and family history with the patient and they are unchanged from previous note.  ALLERGIES:  has No Known Allergies.  MEDICATIONS:  Current Outpatient Prescriptions  Medication Sig Dispense Refill  . amLODipine (NORVASC) 10 MG tablet Take 1 tablet (10 mg total) by mouth daily. 30 tablet  1  . hydrALAZINE (APRESOLINE) 50 MG tablet Take 1 tablet (50 mg total) by mouth 3 (three) times daily. 270 tablet 1  . ibrutinib (IMBRUVICA) 140 MG capsul Take 3 capsules (420 mg total) by mouth daily. 90 capsule 3  . nebivolol (BYSTOLIC) 5 MG tablet Take 1 tablet (5 mg total) by mouth daily. 30 tablet 5  . potassium chloride SA (K-DUR,KLOR-CON) 20 MEQ tablet Take 1 tablet (20 mEq total) by mouth 2 (two) times daily. 60 tablet 5   No current facility-administered medications for this visit.    PHYSICAL EXAMINATION: ECOG PERFORMANCE STATUS: 0 - Asymptomatic  Filed Vitals:   01/29/16 0856  BP: 159/77  Pulse: 58  Temp: 97.9 F (36.6 C)  Resp: 18   Filed Weights   01/29/16 0856  Weight: 199 lb 9.6 oz (90.538 kg)    GENERAL:alert, no distress and comfortable SKIN: skin color, texture, turgor are normal, no rashes or significant lesions EYES: normal, Conjunctiva are pink and non-injected, sclera clear Musculoskeletal:no cyanosis of digits and no clubbing  NEURO: alert & oriented x 3 with fluent speech, no focal motor/sensory deficits  LABORATORY DATA:  I have reviewed the data as listed    Component Value Date/Time   NA 142 11/29/2015 0849   NA 141 10/03/2015 0836   K 3.8 11/29/2015 0849   K 3.9 10/03/2015 0836   CL 104 10/03/2015 0836   CL 105 01/18/2013 0912   CO2 24 11/29/2015 0849   CO2 26 10/03/2015 0836   GLUCOSE 97 11/29/2015 0849   GLUCOSE 87 10/03/2015 0836   GLUCOSE 83 01/18/2013 0912   BUN 9.9 11/29/2015 0849   BUN 16 10/03/2015 0836   CREATININE 0.9 11/29/2015 0849   CREATININE 0.84 10/03/2015 0836   CALCIUM 9.2 11/29/2015 0849  CALCIUM 9.7 10/03/2015 0836   PROT 7.4 11/29/2015 0849   PROT 7.4 03/29/2015 1419   ALBUMIN 4.2 11/29/2015 0849   ALBUMIN 4.4 03/29/2015 1419   AST 15 11/29/2015 0849   AST 18 03/29/2015 1419   ALT 14 11/29/2015 0849   ALT 17 03/29/2015 1419   ALKPHOS 49 11/29/2015 0849   ALKPHOS 69 03/29/2015 1419   BILITOT 1.41*  11/29/2015 0849   BILITOT 0.8 03/29/2015 1419    No results found for: SPEP, UPEP  Lab Results  Component Value Date   WBC 46.3* 01/29/2016   NEUTROABS 4.6 01/29/2016   HGB 12.3 01/29/2016   HCT 39.9 01/29/2016   MCV 85.5 01/29/2016   PLT 224 01/29/2016      Chemistry      Component Value Date/Time   NA 142 11/29/2015 0849   NA 141 10/03/2015 0836   K 3.8 11/29/2015 0849   K 3.9 10/03/2015 0836   CL 104 10/03/2015 0836   CL 105 01/18/2013 0912   CO2 24 11/29/2015 0849   CO2 26 10/03/2015 0836   BUN 9.9 11/29/2015 0849   BUN 16 10/03/2015 0836   CREATININE 0.9 11/29/2015 0849   CREATININE 0.84 10/03/2015 0836      Component Value Date/Time   CALCIUM 9.2 11/29/2015 0849   CALCIUM 9.7 10/03/2015 0836   ALKPHOS 49 11/29/2015 0849   ALKPHOS 69 03/29/2015 1419   AST 15 11/29/2015 0849   AST 18 03/29/2015 1419   ALT 14 11/29/2015 0849   ALT 17 03/29/2015 1419   BILITOT 1.41* 11/29/2015 0849   BILITOT 0.8 03/29/2015 1419      ASSESSMENT & PLAN:  CLL (chronic lymphocytic leukemia) The patient has been consistent taking Ibrutinib since the last time I saw her. She tolerated treatment well without major side effects. Clinically, she is responding to treatment. Lymphadenopathy had resolved I will see her in 2 months for further review  Essential hypertension Her blood pressure was well controlled in her recent visit with primary care doctor. she will continue current medical management. I recommend close follow-up with primary care doctor for medication adjustment.    No orders of the defined types were placed in this encounter.   All questions were answered. The patient knows to call the clinic with any problems, questions or concerns. No barriers to learning was detected. I spent 15 minutes counseling the patient face to face. The total time spent in the appointment was 20 minutes and more than 50% was on counseling and review of test results     [facility], [place],  MD 01/29/2016 9:17 AM

## 2016-01-29 NOTE — Assessment & Plan Note (Signed)
Her blood pressure was well controlled in her recent visit with primary care doctor. she will continue current medical management. I recommend close follow-up with primary care doctor for medication adjustment.

## 2016-01-29 NOTE — Assessment & Plan Note (Signed)
The patient has been consistent taking Ibrutinib since the last time I saw her. She tolerated treatment well without major side effects. Clinically, she is responding to treatment. Lymphadenopathy had resolved I will see her in 2 months for further review

## 2016-01-29 NOTE — Telephone Encounter (Signed)
Gave and printed appt sched and avs for pt for June °

## 2016-02-15 ENCOUNTER — Other Ambulatory Visit: Payer: Self-pay | Admitting: Hematology and Oncology

## 2016-02-18 ENCOUNTER — Telehealth: Payer: Self-pay | Admitting: Pharmacist

## 2016-02-18 NOTE — Telephone Encounter (Signed)
Attempted calling pt at home and cell# to discuss Imbruvica. Cell phone: vm not set up. Home phone: no answer.  Kennith Center, Pharm.D., CPP 02/18/2016@12 :41 PM [facility]

## 2016-02-25 ENCOUNTER — Other Ambulatory Visit: Payer: Self-pay | Admitting: Hematology and Oncology

## 2016-02-25 MED FILL — *IMBRUVICA 140 MG CAPSULE: 140 | 30 days supply | Qty: 90 | Fill #0

## 2016-03-28 MED FILL — *IMBRUVICA 140 MG CAPSULE: 140 | 30 days supply | Qty: 90 | Fill #1

## 2016-03-31 ENCOUNTER — Ambulatory Visit: Payer: Self-pay | Admitting: Hematology and Oncology

## 2016-03-31 ENCOUNTER — Encounter: Payer: Self-pay | Admitting: Hematology and Oncology

## 2016-03-31 ENCOUNTER — Other Ambulatory Visit: Payer: Self-pay

## 2016-03-31 ENCOUNTER — Ambulatory Visit: Payer: BLUE CROSS/BLUE SHIELD | Admitting: Internal Medicine

## 2016-03-31 DIAGNOSIS — Z0289 Encounter for other administrative examinations: Secondary | ICD-10-CM

## 2016-04-01 ENCOUNTER — Encounter: Payer: Self-pay | Admitting: *Deleted

## 2016-04-01 NOTE — Progress Notes (Signed)
No Show Letter from Dr. Alvy Bimler placed in outgoing mail to pt.Audrey Gross

## 2016-04-07 ENCOUNTER — Telehealth: Payer: Self-pay

## 2016-04-07 ENCOUNTER — Ambulatory Visit (INDEPENDENT_AMBULATORY_CARE_PROVIDER_SITE_OTHER): Payer: BLUE CROSS/BLUE SHIELD | Admitting: Internal Medicine

## 2016-04-07 ENCOUNTER — Encounter: Payer: Self-pay | Admitting: Internal Medicine

## 2016-04-07 ENCOUNTER — Other Ambulatory Visit (INDEPENDENT_AMBULATORY_CARE_PROVIDER_SITE_OTHER): Payer: BLUE CROSS/BLUE SHIELD

## 2016-04-07 VITALS — BP 138/80 | HR 50 | Temp 97.7°F | Resp 16 | Ht 67.0 in | Wt 205.0 lb

## 2016-04-07 DIAGNOSIS — R001 Bradycardia, unspecified: Secondary | ICD-10-CM | POA: Diagnosis not present

## 2016-04-07 DIAGNOSIS — D630 Anemia in neoplastic disease: Secondary | ICD-10-CM

## 2016-04-07 DIAGNOSIS — I10 Essential (primary) hypertension: Secondary | ICD-10-CM

## 2016-04-07 DIAGNOSIS — R011 Cardiac murmur, unspecified: Secondary | ICD-10-CM

## 2016-04-07 DIAGNOSIS — R002 Palpitations: Secondary | ICD-10-CM | POA: Diagnosis not present

## 2016-04-07 LAB — CBC WITH DIFFERENTIAL/PLATELET
Basophils Absolute: 0.1 10*3/uL (ref 0.0–0.1)
Basophils Relative: 0.7 % (ref 0.0–3.0)
EOS ABS: 0.1 10*3/uL (ref 0.0–0.7)
Eosinophils Relative: 0.6 % (ref 0.0–5.0)
HEMATOCRIT: 36.3 % (ref 36.0–46.0)
Hemoglobin: 11.8 g/dL — ABNORMAL LOW (ref 12.0–15.0)
Lymphs Abs: 16.1 10*3/uL — ABNORMAL HIGH (ref 0.7–4.0)
MCHC: 32.6 g/dL (ref 30.0–36.0)
MCV: 83.8 fl (ref 78.0–100.0)
Monocytes Absolute: 1 10*3/uL (ref 0.1–1.0)
Monocytes Relative: 4.9 % (ref 3.0–12.0)
NEUTROS ABS: 2.5 10*3/uL (ref 1.4–7.7)
NEUTROS PCT: 12.5 % — AB (ref 43.0–77.0)
PLATELETS: 226 10*3/uL (ref 150.0–400.0)
RBC: 4.33 Mil/uL (ref 3.87–5.11)
RDW: 14.9 % (ref 11.5–15.5)
WBC: 19.8 10*3/uL (ref 4.0–10.5)

## 2016-04-07 LAB — COMPREHENSIVE METABOLIC PANEL
ALT: 7 U/L (ref 0–35)
AST: 11 U/L (ref 0–37)
Albumin: 4 g/dL (ref 3.5–5.2)
Alkaline Phosphatase: 45 U/L (ref 39–117)
BILIRUBIN TOTAL: 0.9 mg/dL (ref 0.2–1.2)
BUN: 11 mg/dL (ref 6–23)
CALCIUM: 9.1 mg/dL (ref 8.4–10.5)
CHLORIDE: 107 meq/L (ref 96–112)
CO2: 28 meq/L (ref 19–32)
Creatinine, Ser: 0.94 mg/dL (ref 0.40–1.20)
GFR: 79.39 mL/min (ref >60.00)
Glucose, Bld: 88 mg/dL (ref 70–99)
POTASSIUM: 3.9 meq/L (ref 3.5–5.1)
Sodium: 140 mEq/L (ref 135–145)
Total Protein: 6.7 g/dL (ref 6.0–8.3)

## 2016-04-07 LAB — THYROID PANEL WITH TSH
FREE THYROXINE INDEX: 2.7 (ref 1.4–3.8)
T3 UPTAKE: 30 % (ref 22–35)
T4, Total: 8.9 ug/dL (ref 4.5–12.0)
TSH: 0.68 m[IU]/L

## 2016-04-07 NOTE — Progress Notes (Signed)
Pre visit review using our clinic review tool, if applicable. No additional management support is needed unless otherwise documented below in the visit note. 

## 2016-04-07 NOTE — Telephone Encounter (Signed)
Artis Delay from the lab called with a critical lab value for pt WBC: 19.8  Verbally gave PCP results. PCP states that value is lower than before.

## 2016-04-07 NOTE — Progress Notes (Signed)
Subjective:  Patient ID: Audrey Gross, female    DOB: [birth year]  Age: 55 y.o. MRN: [id number]  CC: Hypertension   HPI Audrey Gross presents for a blood pressure check. She has had a long-standing history of intermittent palpitations but complains for the last few months that she has had a different sensation that occurs when she is laying in the bed trying to go to sleep. She just has a sensation that her heart is not beating normally. She denies palpitations throughout the day and his had no episodes of chest pain, shortness of breath, syncope, near-syncope, edema, dizziness, lightheadedness, or fatigue.  Outpatient Prescriptions Prior to Visit  Medication Sig Dispense Refill  . amLODipine (NORVASC) 10 MG tablet TAKE ONE TABLET BY MOUTH ONCE DAILY 30 tablet 0  . hydrALAZINE (APRESOLINE) 50 MG tablet Take 1 tablet (50 mg total) by mouth 3 (three) times daily. 270 tablet 1  . IMBRUVICA 140 MG capsul TAKE 3 CAPSULES BY MOUTH DAILY 90 capsule 3  . potassium chloride SA (K-DUR,KLOR-CON) 20 MEQ tablet Take 1 tablet (20 mEq total) by mouth 2 (two) times daily. 60 tablet 5  . nebivolol (BYSTOLIC) 5 MG tablet Take 1 tablet (5 mg total) by mouth daily. 30 tablet 5   No facility-administered medications prior to visit.    ROS Review of Systems  Constitutional: Negative.  Negative for fever, chills, diaphoresis, appetite change and fatigue.  HENT: Negative.   Eyes: Negative.   Respiratory: Negative.  Negative for cough, choking, chest tightness, shortness of breath and stridor.   Cardiovascular: Negative.  Negative for chest pain, palpitations and leg swelling.  Gastrointestinal: Negative.  Negative for nausea, vomiting, abdominal pain, diarrhea and constipation.  Endocrine: Negative.   Genitourinary: Negative.  Negative for decreased urine volume.  Musculoskeletal: Negative.  Negative for back pain and neck pain.  Skin: Negative.   Allergic/Immunologic: Negative.   Neurological: Negative.   Negative for dizziness, syncope, speech difficulty, weakness and light-headedness.  Hematological: Negative.  Negative for adenopathy. Does not bruise/bleed easily.  Psychiatric/Behavioral: Negative.     Objective:  BP 138/80 mmHg  Pulse 50  Temp(Src) 97.7 F (36.5 C) (Oral)  Resp 16  Ht 5\' 7"  (1.702 m)  Wt 205 lb (92.987 kg)  BMI 32.10 kg/m2  SpO2 98%  LMP 07/20/2013  BP Readings from Last 3 Encounters:  04/07/16 138/80  01/29/16 159/77  01/01/16 128/76    Wt Readings from Last 3 Encounters:  04/07/16 205 lb (92.987 kg)  01/29/16 199 lb 9.6 oz (90.538 kg)  01/01/16 200 lb (90.719 kg)    Physical Exam  Constitutional: She is oriented to person, place, and time. No distress.  HENT:  Mouth/Throat: Oropharynx is clear and moist. No oropharyngeal exudate.  Eyes: Conjunctivae are normal. Right eye exhibits no discharge. Left eye exhibits no discharge. No scleral icterus.  Neck: Normal range of motion. Neck supple. No JVD present. No tracheal deviation present. No thyromegaly present.  Cardiovascular: Normal rate, regular rhythm and intact distal pulses.  Exam reveals no gallop and no friction rub.   Murmur heard. Very soft,  1/6 SEM heard everywhere  Pulmonary/Chest: Effort normal and breath sounds normal. No stridor. No respiratory distress. She has no wheezes. She has no rales. She exhibits no tenderness.  Abdominal: Soft. Bowel sounds are normal. She exhibits no distension and no mass. There is no tenderness. There is no rebound and no guarding.  Musculoskeletal: Normal range of motion. She exhibits no edema or tenderness.  Lymphadenopathy:    She has no cervical adenopathy.  Neurological: She is oriented to person, place, and time.  Skin: Skin is warm and dry. No rash noted. She is not diaphoretic. No erythema. No pallor.    Lab Results  Component Value Date   WBC 19.8 Repeated and verified X2.* 04/07/2016   HGB 11.8* 04/07/2016   HCT 36.3 04/07/2016   PLT 226.0  04/07/2016   GLUCOSE 88 04/07/2016   CHOL 177 03/29/2015   TRIG 138.0 03/29/2015   HDL 37.40* 03/29/2015   LDLDIRECT 144.5 07/26/2013   LDLCALC 112* 03/29/2015   ALT 7 04/07/2016   AST 11 04/07/2016   NA 140 04/07/2016   K 3.9 04/07/2016   CL 107 04/07/2016   CREATININE 0.94 04/07/2016   BUN 11 04/07/2016   CO2 28 04/07/2016   TSH 0.68 04/07/2016    US Aspiration  04/26/2015  CLINICAL DATA:  Right breast 10 o'clock complicated cysts. EXAM: ULTRASOUND GUIDED RIGHT BREAST CYST ASPIRATION COMPARISON:  Previous exams. PROCEDURE: Using sterile technique, 1% lidocaine, under direct ultrasound visualization, needle aspiration of 2 right breast 10 o'clock cysts was performed. 8 cc of benign-type fluid was aspirated and discarded. Patient tolerated the procedure well. IMPRESSION: Ultrasound-guided aspiration of the right breast No apparent complications. RECOMMENDATIONS: Annual screening mammography is recommended, as per the diagnostic report. Electronically Signed   By: Fidela Salisbury M.D.   On: 04/26/2015 10:10    Assessment & Plan:   Audrey Gross was seen today for hypertension.  Diagnoses and all orders for this visit:  Palpitations- this appears to me to be a benign situation, she is asymptomatic with respect to this and her EKG today only shows sinus bradycardia. I've asked her to undergo an event monitor to screen for pauses, significant dysrhythmia, or more significant bradycardia. -     EKG 12-Lead -     Cardiac event monitor; Future  Undiagnosed cardiac murmurs- the murmur is consistent with a benign flow murmur -     EKG 12-Lead  Bradycardia- she takes 2 agents for hypertension that can lower her heart rate including a calcium channel blocker and a beta blocker. I think this is the most likely cause for bradycardia and have asked her to stop taking nebivolol. Her thyroid function studies are normal and her electrolytes and renal function are normal. I have asked her to undergo an  event monitor to screen for pauses or other significant dysrhythmias. -     Cardiac event monitor; Future -     Thyroid Panel With TSH; Future  Essential hypertension- her blood pressure is adequately well controlled but she has bradycardia so I have asked her to stop taking nebivolol. She will continue the calcium channel blocker and diuretic for blood pressure control. 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 Radler's nebivolol. I am also having her maintain her potassium chloride SA, hydrALAZINE, amLODipine, and IMBRUVICA.  No orders of the defined types were placed in this encounter.     Follow-up: Return in about 2 months (around 06/08/2016).  [name], MD

## 2016-04-07 NOTE — Patient Instructions (Signed)

## 2016-04-08 ENCOUNTER — Encounter: Payer: Self-pay | Admitting: Internal Medicine

## 2016-04-24 ENCOUNTER — Telehealth: Payer: Self-pay | Admitting: Hematology and Oncology

## 2016-04-24 ENCOUNTER — Other Ambulatory Visit: Payer: Self-pay | Admitting: Internal Medicine

## 2016-04-24 ENCOUNTER — Telehealth: Payer: Self-pay | Admitting: *Deleted

## 2016-04-24 DIAGNOSIS — Z1231 Encounter for screening mammogram for malignant neoplasm of breast: Secondary | ICD-10-CM

## 2016-04-24 MED FILL — *IMBRUVICA 140 MG CAPSULE: 140 | 30 days supply | Qty: 90 | Fill #2

## 2016-04-24 NOTE — Telephone Encounter (Signed)
spoke w/ pt confirmed 8/1 apt time

## 2016-04-24 NOTE — Telephone Encounter (Signed)
FYI "I need to reschedule appointment I missed in June."  Call transfered to scheduler [phone].  Voicemail received by patient.  P.O.F. Generated to facilitate.

## 2016-05-03 ENCOUNTER — Other Ambulatory Visit: Payer: Self-pay | Admitting: Hematology and Oncology

## 2016-05-06 ENCOUNTER — Telehealth: Payer: Self-pay | Admitting: Hematology and Oncology

## 2016-05-06 ENCOUNTER — Encounter: Payer: Self-pay | Admitting: Hematology and Oncology

## 2016-05-06 ENCOUNTER — Other Ambulatory Visit (HOSPITAL_BASED_OUTPATIENT_CLINIC_OR_DEPARTMENT_OTHER): Payer: BLUE CROSS/BLUE SHIELD

## 2016-05-06 ENCOUNTER — Ambulatory Visit
Admission: RE | Admit: 2016-05-06 | Discharge: 2016-05-06 | Disposition: A | Discharge status: Home / Self Care | Payer: BLUE CROSS/BLUE SHIELD | Source: Ambulatory Visit | Attending: Internal Medicine | Admitting: Internal Medicine

## 2016-05-06 ENCOUNTER — Ambulatory Visit (HOSPITAL_BASED_OUTPATIENT_CLINIC_OR_DEPARTMENT_OTHER): Payer: BLUE CROSS/BLUE SHIELD | Admitting: Hematology and Oncology

## 2016-05-06 VITALS — BP 144/78 | HR 77 | Temp 98.1°F | Resp 18 | Wt 202.2 lb

## 2016-05-06 DIAGNOSIS — I10 Essential (primary) hypertension: Secondary | ICD-10-CM

## 2016-05-06 DIAGNOSIS — Z1231 Encounter for screening mammogram for malignant neoplasm of breast: Secondary | ICD-10-CM | POA: Diagnosis not present

## 2016-05-06 DIAGNOSIS — E876 Hypokalemia: Secondary | ICD-10-CM | POA: Diagnosis not present

## 2016-05-06 DIAGNOSIS — C9110 Chronic lymphocytic leukemia of B-cell type not having achieved remission: Secondary | ICD-10-CM | POA: Diagnosis not present

## 2016-05-06 LAB — COMPREHENSIVE METABOLIC PANEL
ALT: 12 U/L (ref 0–55)
AST: 17 U/L (ref 5–34)
Albumin: 4.1 g/dL (ref 3.5–5.0)
Alkaline Phosphatase: 56 U/L (ref 40–150)
Anion Gap: 8 mEq/L (ref 3–11)
BUN: 9.8 mg/dL (ref 7.0–26.0)
CHLORIDE: 109 meq/L (ref 98–109)
CO2: 23 meq/L (ref 22–29)
Calcium: 9.3 mg/dL (ref 8.4–10.4)
Creatinine: 0.8 mg/dL (ref 0.6–1.1)
GLUCOSE: 98 mg/dL (ref 70–140)
POTASSIUM: 3.4 meq/L — AB (ref 3.5–5.1)
SODIUM: 140 meq/L (ref 136–145)
Total Bilirubin: 1.19 mg/dL (ref 0.20–1.20)
Total Protein: 7.2 g/dL (ref 6.4–8.3)

## 2016-05-06 LAB — CBC WITH DIFFERENTIAL/PLATELET
BASO%: 0.8 % (ref 0.0–2.0)
BASOS ABS: 0.2 10*3/uL — AB (ref 0.0–0.1)
EOS ABS: 0.1 10*3/uL (ref 0.0–0.5)
EOS%: 0.2 % (ref 0.0–7.0)
HCT: 40.6 % (ref 34.8–46.6)
HGB: 12.7 g/dL (ref 11.6–15.9)
LYMPH%: 76.8 % — AB (ref 14.0–49.7)
MCH: 26.4 pg (ref 25.1–34.0)
MCHC: 31.3 g/dL — ABNORMAL LOW (ref 31.5–36.0)
MCV: 84.5 fL (ref 79.5–101.0)
MONO#: 1.2 10*3/uL — ABNORMAL HIGH (ref 0.1–0.9)
MONO%: 4.1 % (ref 0.0–14.0)
NEUT#: 5.4 10*3/uL (ref 1.5–6.5)
NEUT%: 18.1 % — ABNORMAL LOW (ref 38.4–76.8)
Platelets: 215 10*3/uL (ref 145–400)
RBC: 4.8 10*6/uL (ref 3.70–5.45)
RDW: 14.5 % (ref 11.2–14.5)
WBC: 29.9 10*3/uL — AB (ref 3.9–10.3)
lymph#: 23 10*3/uL — ABNORMAL HIGH (ref 0.9–3.3)

## 2016-05-06 LAB — TECHNOLOGIST REVIEW

## 2016-05-06 LAB — HM MAMMOGRAPHY

## 2016-05-06 MED ORDER — AMLODIPINE BESYLATE 10 MG PO TABS: 10.0000 mg | ORAL_TABLET | Freq: Every day | ORAL | 9 refills | Status: DC

## 2016-05-06 NOTE — Progress Notes (Signed)
[facility] OFFICE PROGRESS NOTE  Patient Care Team: Janith Lima, MD as PCP - General (Internal Medicine)  SUMMARY OF ONCOLOGIC HISTORY:   CLL (chronic lymphocytic leukemia) ([place])   04/05/2015 Pathology Results    Accession: [id number] flow cytometry confirmed CLL. FISH was positive for p53 mutation     04/24/2015 Imaging    Extensive lymphadenopathy throughout the neck, chest (axilla), abdomen and pelvis, as detailed above, compatible with the reported clinical history of lymphoma. 2. Mild splenomegaly.     05/03/2015 - 08/27/2015 Chemotherapy    She started on Ibrutinib, discontinued prematurely when her prescription ran out     10/10/2015 -  Chemotherapy    She was restarted back on Ibrutinib      INTERVAL HISTORY: Please see below for problem oriented charting. She returns for follow-up. She had recent bradycardia and palpitation. Beta blocker was discontinued and that resolved. She is compliant taking her medication. She denies diarrhea or bruising She denies recent infection  REVIEW OF SYSTEMS:   Constitutional: Denies fevers, chills or abnormal weight loss Eyes: Denies blurriness of vision Ears, nose, mouth, throat, and face: Denies mucositis or sore throat Respiratory: Denies cough, dyspnea or wheezes Cardiovascular: Denies palpitation, chest discomfort or lower extremity swelling Gastrointestinal:  Denies nausea, heartburn or change in bowel habits Skin: Denies abnormal skin rashes Lymphatics: Denies new lymphadenopathy or easy bruising Neurological:Denies numbness, tingling or new weaknesses Behavioral/Psych: Mood is stable, no new changes  All other systems were reviewed with the patient and are negative.  I have reviewed the past medical history, past surgical history, social history and family history with the patient and they are unchanged from previous note.  ALLERGIES:  has No Known Allergies.  MEDICATIONS:  Current Outpatient Prescriptions   Medication Sig Dispense Refill  . amLODipine (NORVASC) 10 MG tablet Take 1 tablet (10 mg total) by mouth daily. 90 tablet 9  . hydrALAZINE (APRESOLINE) 50 MG tablet Take 1 tablet (50 mg total) by mouth 3 (three) times daily. 270 tablet 1  . IMBRUVICA 140 MG capsul TAKE 3 CAPSULES BY MOUTH DAILY 90 capsule 3  . potassium chloride SA (K-DUR,KLOR-CON) 20 MEQ tablet Take 1 tablet (20 mEq total) by mouth 2 (two) times daily. 60 tablet 5   No current facility-administered medications for this visit.     PHYSICAL EXAMINATION: ECOG PERFORMANCE STATUS: 0 - Asymptomatic  Vitals:   05/06/16 1220  BP: (!) 144/78  Pulse: 77  Resp: 18  Temp: 98.1 F (36.7 C)   Filed Weights   05/06/16 1220  Weight: 202 lb 3.2 oz (91.7 kg)    GENERAL:alert, no distress and comfortable SKIN: skin color, texture, turgor are normal, no rashes or significant lesions EYES: normal, Conjunctiva are pink and non-injected, sclera clear OROPHARYNX:no exudate, no erythema and lips, buccal mucosa, and tongue normal  NECK: supple, thyroid normal size, non-tender, without nodularity LYMPH:  no palpable lymphadenopathy in the cervical, axillary or inguinal LUNGS: clear to auscultation and percussion with normal breathing effort HEART: regular rate & rhythm and no murmurs and no lower extremity edema ABDOMEN:abdomen soft, non-tender and normal bowel sounds Musculoskeletal:no cyanosis of digits and no clubbing  NEURO: alert & oriented x 3 with fluent speech, no focal motor/sensory deficits  LABORATORY DATA:  I have reviewed the data as listed    Component Value Date/Time   NA 140 05/06/2016 1203   K 3.4 (L) 05/06/2016 1203   CL 107 04/07/2016 1604   CL 105 01/18/2013 0912  CO2 23 05/06/2016 1203   GLUCOSE 98 05/06/2016 1203   GLUCOSE 83 01/18/2013 0912   BUN 9.8 05/06/2016 1203   CREATININE 0.8 05/06/2016 1203   CALCIUM 9.3 05/06/2016 1203   PROT 7.2 05/06/2016 1203   ALBUMIN 4.1 05/06/2016 1203   AST 17  05/06/2016 1203   ALT 12 05/06/2016 1203   ALKPHOS 56 05/06/2016 1203   BILITOT 1.19 05/06/2016 1203    No results found for: SPEP, UPEP  Lab Results  Component Value Date   WBC 29.9 (H) 05/06/2016   NEUTROABS 5.4 05/06/2016   HGB 12.7 05/06/2016   HCT 40.6 05/06/2016   MCV 84.5 05/06/2016   PLT 215 05/06/2016      Chemistry      Component Value Date/Time   NA 140 05/06/2016 1203   K 3.4 (L) 05/06/2016 1203   CL 107 04/07/2016 1604   CL 105 01/18/2013 0912   CO2 23 05/06/2016 1203   BUN 9.8 05/06/2016 1203   CREATININE 0.8 05/06/2016 1203      Component Value Date/Time   CALCIUM 9.3 05/06/2016 1203   ALKPHOS 56 05/06/2016 1203   AST 17 05/06/2016 1203   ALT 12 05/06/2016 1203   BILITOT 1.19 05/06/2016 1203      ASSESSMENT & PLAN:  CLL (chronic lymphocytic leukemia) The patient has been consistent taking Ibrutinib since the last time I saw her. She tolerated treatment well without major side effects. Clinically, she is responding to treatment. Lymphadenopathy had resolved I will see her in 3 months for further review  Essential hypertension Her blood pressure was well controlled in her recent visit with primary care doctor. She had recent bradycardia, and beta blocker was discontinued. Her blood pressure is better controlled now she will continue current medical management. I recommend close follow-up with primary care doctor for medication adjustment.  Hypokalemia She is taking potassium replacement therapy. Continue.   No orders of the defined types were placed in this encounter.  All questions were answered. The patient knows to call the clinic with any problems, questions or concerns. No barriers to learning was detected. I spent 15 minutes counseling the patient face to face. The total time spent in the appointment was 20 minutes and more than 50% was on counseling and review of test results     [facility], Noboru Bidinger, MD 05/06/2016 1:01 PM

## 2016-05-06 NOTE — Assessment & Plan Note (Addendum)
Her blood pressure was well controlled in her recent visit with primary care doctor. She had recent bradycardia, and beta blocker was discontinued. Her blood pressure is better controlled now she will continue current medical management. I recommend close follow-up with primary care doctor for medication adjustment.

## 2016-05-06 NOTE — Assessment & Plan Note (Signed)
The patient has been consistent taking Ibrutinib since the last time I saw her. She tolerated treatment well without major side effects. Clinically, she is responding to treatment. Lymphadenopathy had resolved I will see her in 3 months for further review

## 2016-05-06 NOTE — Assessment & Plan Note (Signed)
She is taking potassium replacement therapy. Continue.

## 2016-05-06 NOTE — Telephone Encounter (Signed)
Per pof to sch pt appt-gave pt copy of avs °

## 2016-05-27 MED FILL — *IMBRUVICA 140 MG CAPSULE: 140 | 30 days supply | Qty: 90 | Fill #3

## 2016-06-11 ENCOUNTER — Ambulatory Visit (INDEPENDENT_AMBULATORY_CARE_PROVIDER_SITE_OTHER): Payer: BLUE CROSS/BLUE SHIELD | Admitting: Internal Medicine

## 2016-06-11 ENCOUNTER — Other Ambulatory Visit (INDEPENDENT_AMBULATORY_CARE_PROVIDER_SITE_OTHER): Payer: BLUE CROSS/BLUE SHIELD

## 2016-06-11 ENCOUNTER — Encounter: Payer: Self-pay | Admitting: Internal Medicine

## 2016-06-11 VITALS — BP 142/82 | HR 58 | Temp 97.8°F | Resp 16 | Ht 67.0 in | Wt 198.0 lb

## 2016-06-11 DIAGNOSIS — I10 Essential (primary) hypertension: Secondary | ICD-10-CM | POA: Diagnosis not present

## 2016-06-11 DIAGNOSIS — R002 Palpitations: Secondary | ICD-10-CM | POA: Diagnosis not present

## 2016-06-11 DIAGNOSIS — E876 Hypokalemia: Secondary | ICD-10-CM

## 2016-06-11 DIAGNOSIS — Z23 Encounter for immunization: Secondary | ICD-10-CM | POA: Diagnosis not present

## 2016-06-11 DIAGNOSIS — R011 Cardiac murmur, unspecified: Secondary | ICD-10-CM | POA: Diagnosis not present

## 2016-06-11 DIAGNOSIS — E785 Hyperlipidemia, unspecified: Secondary | ICD-10-CM

## 2016-06-11 DIAGNOSIS — E784 Other hyperlipidemia: Secondary | ICD-10-CM

## 2016-06-11 DIAGNOSIS — Z124 Encounter for screening for malignant neoplasm of cervix: Secondary | ICD-10-CM | POA: Insufficient documentation

## 2016-06-11 LAB — LIPID PANEL
CHOLESTEROL: 177 mg/dL (ref 0–200)
HDL: 44.5 mg/dL (ref >39.00)
LDL CALC: 96 mg/dL (ref 0–99)
NonHDL: 132.68
TRIGLYCERIDES: 181 mg/dL — AB (ref 0.0–149.0)
Total CHOL/HDL Ratio: 4
VLDL: 36.2 mg/dL (ref 0.0–40.0)

## 2016-06-11 LAB — BASIC METABOLIC PANEL
BUN: 10 mg/dL (ref 6–23)
CHLORIDE: 105 meq/L (ref 96–112)
CO2: 28 meq/L (ref 19–32)
CREATININE: 0.81 mg/dL (ref 0.40–1.20)
Calcium: 8.8 mg/dL (ref 8.4–10.5)
GFR: 94.21 mL/min (ref >60.00)
Glucose, Bld: 82 mg/dL (ref 70–99)
POTASSIUM: 4.4 meq/L (ref 3.5–5.1)
Sodium: 140 mEq/L (ref 135–145)

## 2016-06-11 LAB — MAGNESIUM: MAGNESIUM: 2 mg/dL (ref 1.5–2.5)

## 2016-06-11 NOTE — Progress Notes (Signed)
Subjective:  Patient ID: Audrey Gross, female    DOB: 1961/06/29  Age: 55 y.o. MRN: [id number]  CC: Hypertension   HPI Audrey Gross presents for follow-up on hypertension and palpitations. She continues to complain of a sensation that her heart is beating irregularly when she is at rest. She experiences palpitations nearly every day. This has been going on for over 8 months. She was previously referred for some cardiovascular testing but she did not return the phone call and therefore the testing has not been done. The palpitations are not associated with chest pain/blurred vision/shortness of breath/near-syncope/dizziness/lightheadedness/or fatigue. She does not experience DOE or edema.  Outpatient Medications Prior to Visit  Medication Sig Dispense Refill  . amLODipine (NORVASC) 10 MG tablet Take 1 tablet (10 mg total) by mouth daily. 90 tablet 9  . hydrALAZINE (APRESOLINE) 50 MG tablet Take 1 tablet (50 mg total) by mouth 3 (three) times daily. 270 tablet 1  . IMBRUVICA 140 MG capsul TAKE 3 CAPSULES BY MOUTH DAILY 90 capsule 3  . potassium chloride SA (K-DUR,KLOR-CON) 20 MEQ tablet Take 1 tablet (20 mEq total) by mouth 2 (two) times daily. 60 tablet 5   No facility-administered medications prior to visit.     ROS Review of Systems  Constitutional: Negative for appetite change, chills, diaphoresis, fatigue and unexpected weight change.  HENT: Negative.  Negative for trouble swallowing.   Eyes: Negative.  Negative for visual disturbance.  Respiratory: Negative for cough, choking, chest tightness, shortness of breath and stridor.   Cardiovascular: Positive for palpitations. Negative for chest pain and leg swelling.  Gastrointestinal: Negative.  Negative for abdominal pain, blood in stool, constipation, diarrhea, nausea and vomiting.  Endocrine: Negative.   Genitourinary: Negative.  Negative for difficulty urinating.  Musculoskeletal: Negative.  Negative for back pain, joint swelling,  myalgias and neck pain.  Skin: Negative.   Allergic/Immunologic: Negative.   Neurological: Negative.  Negative for dizziness, syncope, weakness, light-headedness and numbness.  Hematological: Negative.  Negative for adenopathy. Does not bruise/bleed easily.  Psychiatric/Behavioral: Negative.  Negative for sleep disturbance. The patient is not nervous/anxious.     Objective:  BP (!) 142/82 (BP Location: Left Arm, Patient Position: Sitting, Cuff Size: Normal)   Pulse (!) 58   Temp 97.8 F (36.6 C) (Oral)   Resp 16   Ht 5\' 7"  (1.702 m)   Wt 198 lb (89.8 kg)   LMP 07/20/2013   SpO2 98%   BMI 31.01 kg/m   BP Readings from Last 3 Encounters:  06/11/16 (!) 142/82  05/06/16 (!) 144/78  04/07/16 138/80    Wt Readings from Last 3 Encounters:  06/11/16 198 lb (89.8 kg)  05/06/16 202 lb 3.2 oz (91.7 kg)  04/07/16 205 lb (93 kg)    Physical Exam  Constitutional: She is oriented to person, place, and time. No distress.  HENT:  Mouth/Throat: Oropharynx is clear and moist. No oropharyngeal exudate.  Eyes: Conjunctivae are normal. Right eye exhibits no discharge. Left eye exhibits no discharge. No scleral icterus.  Neck: Normal range of motion. Neck supple. No JVD present. No tracheal deviation present. No thyromegaly present.  Cardiovascular: Normal rate, regular rhythm, S1 normal, S2 normal and intact distal pulses.  Exam reveals no gallop and no friction rub.   Murmur heard.  Systolic murmur is present with a grade of 1/6   No diastolic murmur is present  Pulmonary/Chest: Effort normal and breath sounds normal. No stridor. No respiratory distress. She has no wheezes. She  has no rales. She exhibits no tenderness.  Abdominal: Soft. Bowel sounds are normal. She exhibits no distension and no mass. There is no tenderness. There is no rebound and no guarding.  Musculoskeletal: Normal range of motion. She exhibits no edema, tenderness or deformity.  Lymphadenopathy:    She has no cervical  adenopathy.  Neurological: She is oriented to person, place, and time.  Skin: Skin is warm and dry. No rash noted. She is not diaphoretic. No erythema. No pallor.  Psychiatric: She has a normal mood and affect. Her behavior is normal. Judgment and thought content normal.  Vitals reviewed.   Lab Results  Component Value Date   WBC 29.9 (H) 05/06/2016   HGB 12.7 05/06/2016   HCT 40.6 05/06/2016   PLT 215 05/06/2016   GLUCOSE 82 06/11/2016   CHOL 177 06/11/2016   TRIG 181.0 (H) 06/11/2016   HDL 44.50 06/11/2016   LDLDIRECT 144.5 07/26/2013   LDLCALC 96 06/11/2016   ALT 12 05/06/2016   AST 17 05/06/2016   NA 140 06/11/2016   K 4.4 06/11/2016   CL 105 06/11/2016   CREATININE 0.81 06/11/2016   BUN 10 06/11/2016   CO2 28 06/11/2016   TSH 0.68 04/07/2016    Mm Digital Screening Bilateral  Result Date: 05/06/2016 CLINICAL DATA:  Screening. EXAM: DIGITAL SCREENING BILATERAL MAMMOGRAM WITH CAD COMPARISON:  Previous exam(s). ACR Breast Density Category c: The breast tissue is heterogeneously dense, which may obscure small masses. FINDINGS: There are no findings suspicious for malignancy. Images were processed with CAD. IMPRESSION: No mammographic evidence of malignancy. A result letter of this screening mammogram will be mailed directly to the patient. RECOMMENDATION: Screening mammogram in one year. (Code:SM-B-01Y) BI-RADS CATEGORY  1: Negative. Electronically Signed   By: Ammie Ferrier M.D.   On: 05/06/2016 15:17    Assessment & Plan:   Audrey Gross was seen today for hypertension.  Diagnoses and all orders for this visit:  Hypokalemia- Her potassium level is normal now, will continue the potassium supplementation. -     Magnesium; Future  Palpitations- operative dictation sound benign, I have ordered a 48-hour Holter monitor to screen for bradycardia/atrial fibrillation/tachybradycardia syndrome/SVT -     Cancel: Holter monitor - 48 hour; Future -     ECHOCARDIOGRAM COMPLETE;  Future -     Holter monitor - 48 hour; Future  Heart murmur, systolic- she had an echocardiogram about 4 years ago that was positive for diastolic dysfunction, the palpitations or symptoms of slightly worse and so will recheck her echocardiogram to see her for ejection fraction has decreased and to screen for structural heart disease -     Cancel: ECHOCARDIOGRAM COMPLETE; Future -     ECHOCARDIOGRAM COMPLETE; Future -     Holter monitor - 48 hour; Future  Essential hypertension- her blood pressures adequately well-controlled. -     Magnesium; Future -     Basic metabolic panel; Future  Dyslipidemia (high LDL; low HDL)- her Framingham risk score is 4% so I do not recommend that she start taking a statin for risk reduction -     Lipid panel; Future  Need for prophylactic vaccination and inoculation against influenza -     Flu Vaccine QUAD 36+ mos IM  Screening for cervical cancer -     Ambulatory referral to Gynecology   I am having Audrey Gross maintain her potassium chloride SA, hydrALAZINE, IMBRUVICA, and amLODipine.  No orders of the defined types were placed in this encounter.  Follow-up: Return in about 6 weeks (around 07/23/2016).  [name], MD

## 2016-06-11 NOTE — Patient Instructions (Signed)

## 2016-06-11 NOTE — Progress Notes (Signed)
Pre visit review using our clinic review tool, if applicable. No additional management support is needed unless otherwise documented below in the visit note. 

## 2016-06-16 ENCOUNTER — Other Ambulatory Visit (HOSPITAL_COMMUNITY): Payer: Self-pay

## 2016-06-25 ENCOUNTER — Ambulatory Visit (INDEPENDENT_AMBULATORY_CARE_PROVIDER_SITE_OTHER): Payer: BLUE CROSS/BLUE SHIELD

## 2016-06-25 ENCOUNTER — Ambulatory Visit (HOSPITAL_COMMUNITY): Payer: Self-pay

## 2016-06-25 DIAGNOSIS — R011 Cardiac murmur, unspecified: Secondary | ICD-10-CM

## 2016-06-25 DIAGNOSIS — R002 Palpitations: Secondary | ICD-10-CM | POA: Diagnosis not present

## 2016-06-27 ENCOUNTER — Other Ambulatory Visit: Payer: Self-pay | Admitting: Hematology and Oncology

## 2016-06-27 MED FILL — *IMBRUVICA 140 MG CAPSULE: 140 | 30 days supply | Qty: 90 | Fill #0

## 2016-07-01 ENCOUNTER — Other Ambulatory Visit: Payer: Self-pay | Admitting: Internal Medicine

## 2016-07-07 ENCOUNTER — Other Ambulatory Visit: Payer: Self-pay

## 2016-07-07 ENCOUNTER — Ambulatory Visit (HOSPITAL_COMMUNITY): Payer: BLUE CROSS/BLUE SHIELD | Attending: Cardiology

## 2016-07-07 ENCOUNTER — Encounter: Payer: Self-pay | Admitting: Internal Medicine

## 2016-07-07 DIAGNOSIS — E785 Hyperlipidemia, unspecified: Secondary | ICD-10-CM | POA: Diagnosis not present

## 2016-07-07 DIAGNOSIS — R011 Cardiac murmur, unspecified: Secondary | ICD-10-CM | POA: Insufficient documentation

## 2016-07-07 DIAGNOSIS — I071 Rheumatic tricuspid insufficiency: Secondary | ICD-10-CM | POA: Insufficient documentation

## 2016-07-07 DIAGNOSIS — R002 Palpitations: Secondary | ICD-10-CM

## 2016-07-07 DIAGNOSIS — I119 Hypertensive heart disease without heart failure: Secondary | ICD-10-CM | POA: Diagnosis not present

## 2016-07-07 DIAGNOSIS — I340 Nonrheumatic mitral (valve) insufficiency: Secondary | ICD-10-CM | POA: Insufficient documentation

## 2016-07-08 ENCOUNTER — Other Ambulatory Visit: Payer: Self-pay | Admitting: Internal Medicine

## 2016-07-08 ENCOUNTER — Encounter: Payer: Self-pay | Admitting: Internal Medicine

## 2016-07-08 DIAGNOSIS — R001 Bradycardia, unspecified: Secondary | ICD-10-CM

## 2016-07-08 DIAGNOSIS — I480 Paroxysmal atrial fibrillation: Secondary | ICD-10-CM

## 2016-07-09 ENCOUNTER — Ambulatory Visit: Payer: Self-pay | Admitting: Obstetrics and Gynecology

## 2016-07-10 ENCOUNTER — Ambulatory Visit (INDEPENDENT_AMBULATORY_CARE_PROVIDER_SITE_OTHER): Payer: BLUE CROSS/BLUE SHIELD | Admitting: Obstetrics and Gynecology

## 2016-07-10 ENCOUNTER — Encounter: Payer: Self-pay | Admitting: Obstetrics and Gynecology

## 2016-07-10 VITALS — BP 149/90 | HR 64 | Resp 14 | Ht 67.0 in | Wt 200.0 lb

## 2016-07-10 DIAGNOSIS — Z01419 Encounter for gynecological examination (general) (routine) without abnormal findings: Secondary | ICD-10-CM

## 2016-07-10 DIAGNOSIS — Z124 Encounter for screening for malignant neoplasm of cervix: Secondary | ICD-10-CM | POA: Diagnosis not present

## 2016-07-10 DIAGNOSIS — Z113 Encounter for screening for infections with a predominantly sexual mode of transmission: Secondary | ICD-10-CM | POA: Diagnosis not present

## 2016-07-10 DIAGNOSIS — N926 Irregular menstruation, unspecified: Secondary | ICD-10-CM

## 2016-07-10 DIAGNOSIS — Z0000 Encounter for general adult medical examination without abnormal findings: Secondary | ICD-10-CM | POA: Diagnosis not present

## 2016-07-10 DIAGNOSIS — N898 Other specified noninflammatory disorders of vagina: Secondary | ICD-10-CM | POA: Diagnosis not present

## 2016-07-10 DIAGNOSIS — N951 Menopausal and female climacteric states: Secondary | ICD-10-CM | POA: Diagnosis not present

## 2016-07-10 LAB — HM PAP SMEAR

## 2016-07-10 MED ORDER — MEDROXYPROGESTERONE ACETATE 5 MG PO TABS: ORAL_TABLET | ORAL | 1 refills | Status: DC

## 2016-07-10 NOTE — Patient Instructions (Signed)

## 2016-07-10 NOTE — Addendum Note (Signed)
Addended by: Dorothy Spark on: 07/10/2016 05:09 PM   Modules accepted: Orders

## 2016-07-10 NOTE — Progress Notes (Addendum)
55 y.o. [id number] Legally SeparatedAfrican AmericanF here for annual exam.  Prior to the last year her cycles were q month. Since then she has had 3 cycles that she can remember every 3-4 months. Normal flow for her. No bleeding in between her cycles.  Period Duration (Days): has only had 3 cycles this year  / last about 7 days  Period Pattern: (!) Irregular Menstrual Flow: Heavy Menstrual Control: Maxi pad Menstrual Control Change Freq (Hours): changes pad every 2 hours  Dysmenorrhea: None  No night sweats, she feels hot a lot, not sure if she is having hot flashes or not. No vaginal dryness or dyspareunia.   LMP: 06/15/16     Sexually active: Yes.    The current method of family planning is tubal ligation.    Exercising: Yes.    walking Smoker:  no  Health Maintenance: Pap:  unsure History of abnormal Pap:  no MMG:  05-06-16 WNL Colonoscopy:  04-07-12 WNL repeat in 10 years  BMD:   2013, normal TDaP:  06-21-12  Gardasil: N/A   reports that she has never smoked. She has never used smokeless tobacco. She reports that she does not drink alcohol or use drugs. She has 5 daughters range from 35 to 75.   Past Medical History:  Diagnosis Date  . CLL (chronic lymphoblastic leukemia)    Leukemia  . Cystic breast   . Depression   . Diffuse cystic mastopathy   . Dyslipidemia (high LDL; low HDL)   . Hypertension   . Iron deficiency anemia, unspecified   . Lymphadenopathy of head and neck 04/05/2015  . Medical non-compliance   . Osteopenia   . Systolic ejection murmur     Past Surgical History:  Procedure Laterality Date  . TUBAL LIGATION      Current Outpatient Prescriptions  Medication Sig Dispense Refill  . hydrALAZINE (APRESOLINE) 50 MG tablet Take 1 tablet (50 mg total) by mouth 3 (three) times daily. 270 tablet 1  . IMBRUVICA 140 MG capsul TAKE 3 CAPSULES BY MOUTH DAILY 90 capsule 3  . potassium chloride SA (K-DUR,KLOR-CON) 20 MEQ tablet Take 1 tablet (20 mEq total) by mouth 2  (two) times daily. 60 tablet 5  . amLODipine (NORVASC) 10 MG tablet Take 1 tablet (10 mg total) by mouth daily. 90 tablet 9   No current facility-administered medications for this visit.     Family History  Problem Relation Age of Onset  . Kidney disease Father     H/O HD and kidney transplant  . Stomach cancer Maternal Aunt   . Stomach cancer Paternal Grandmother     Review of Systems  Constitutional: Negative.   HENT: Negative.   Eyes: Negative.   Respiratory: Negative.   Cardiovascular: Negative.   Gastrointestinal: Negative.   Endocrine: Negative.   Genitourinary: Positive for menstrual problem.       Irregular menstrual cycles Heavy menstrual bleeding  Musculoskeletal: Negative.   Skin: Negative.   Allergic/Immunologic: Negative.   Neurological: Negative.   Psychiatric/Behavioral: Negative.     Exam:   BP (!) 149/90 (BP Location: Right Arm, Patient Position: Sitting, Cuff Size: Normal)   Pulse 64   Resp 14   Ht 5\' 7"  (1.702 m)   Wt 200 lb (90.7 kg)   LMP 06/15/2016   BMI 31.32 kg/m   Weight change: @WEIGHTCHANGE @ Height:   Height: 5\' 7"  (170.2 cm)  Ht Readings from Last 3 Encounters:  07/10/16 5\' 7"  (1.702 m)  06/11/16 5\' 7"  (  1.702 m)  04/07/16 5\' 7"  (1.702 m)    General appearance: alert, cooperative and appears stated age Head: Normocephalic, without obvious abnormality, atraumatic Neck: no adenopathy, supple, symmetrical, trachea midline and thyroid normal to inspection and palpation Lungs: clear to auscultation bilaterally Breasts: normal appearance, no masses or tenderness Heart: regular rate and rhythm Abdomen: soft, non-tender; bowel sounds normal; no masses,  no organomegaly Extremities: extremities normal, atraumatic, no cyanosis or edema Skin: Skin color, texture, turgor normal. No rashes or lesions Lymph nodes: Cervical, supraclavicular, and axillary nodes normal. No abnormal inguinal nodes palpated Neurologic: Grossly normal   Pelvic:  External genitalia:  no lesions              Urethra:  normal appearing urethra with no masses, tenderness or lesions              Bartholins and Skenes: normal                 Vagina: normal appearing vagina with normal color and discharge, no lesions              Cervix: no lesions and friable with pap               Bimanual Exam:  Uterus:  not appreciably enlarged, patient tensing with exam, no masses, not tender              Adnexa: no mass, fullness, tenderness               Rectovaginal: Confirms               Anus:  normal sphincter tone, no lesions  Chaperone was present for exam.  A:  Well Woman with normal exam  STD testing: GC/CT, HIV, RPR (already tested for hepatitis B &C with her primary)  Perimenopause  Irregular menses  P:   Pap with hpv  STD testing  Mammogram UTD  Colonoscopy UTD  Discussed breast self exam  Discussed calcium and vit D intake  Vit D level  The rest of her labs with her primary  Discussed perimenopausal bleeding, discussed cyclic provera  Script for cyclic provera given  Addendum: error above, she did have a watery, frothy vaginal d/c on exam. Patient has noticed an increased d/c recently.  Wet prep probe sent.

## 2016-07-11 ENCOUNTER — Encounter: Payer: Self-pay | Admitting: Cardiovascular Disease

## 2016-07-11 ENCOUNTER — Ambulatory Visit (INDEPENDENT_AMBULATORY_CARE_PROVIDER_SITE_OTHER): Payer: BLUE CROSS/BLUE SHIELD | Admitting: Cardiovascular Disease

## 2016-07-11 ENCOUNTER — Telehealth: Payer: Self-pay

## 2016-07-11 VITALS — BP 150/85 | HR 57 | Ht 67.0 in | Wt 196.2 lb

## 2016-07-11 DIAGNOSIS — I480 Paroxysmal atrial fibrillation: Secondary | ICD-10-CM

## 2016-07-11 DIAGNOSIS — E781 Pure hyperglyceridemia: Secondary | ICD-10-CM

## 2016-07-11 DIAGNOSIS — E669 Obesity, unspecified: Secondary | ICD-10-CM

## 2016-07-11 DIAGNOSIS — C9110 Chronic lymphocytic leukemia of B-cell type not having achieved remission: Secondary | ICD-10-CM

## 2016-07-11 DIAGNOSIS — I119 Hypertensive heart disease without heart failure: Secondary | ICD-10-CM

## 2016-07-11 DIAGNOSIS — I10 Essential (primary) hypertension: Secondary | ICD-10-CM

## 2016-07-11 DIAGNOSIS — R011 Cardiac murmur, unspecified: Secondary | ICD-10-CM

## 2016-07-11 LAB — RPR

## 2016-07-11 LAB — IPS PAP TEST WITH HPV

## 2016-07-11 LAB — WET PREP BY MOLECULAR PROBE
Candida species: NEGATIVE
GARDNERELLA VAGINALIS: POSITIVE — AB
TRICHOMONAS VAG: NEGATIVE

## 2016-07-11 LAB — VITAMIN D 25 HYDROXY (VIT D DEFICIENCY, FRACTURES): Vit D, 25-Hydroxy: 11 ng/mL — ABNORMAL LOW (ref 30–100)

## 2016-07-11 LAB — HIV ANTIBODY (ROUTINE TESTING W REFLEX): HIV 1&2 Ab, 4th Generation: NONREACTIVE

## 2016-07-11 LAB — IPS N GONORRHOEA AND CHLAMYDIA BY PCR

## 2016-07-11 MED ORDER — CARVEDILOL 6.25 MG PO TABS: 6.2500 mg | ORAL_TABLET | Freq: Two times a day (BID) | ORAL | 3 refills | Status: DC

## 2016-07-11 MED ORDER — METRONIDAZOLE 0.75 % VA GEL: 1.0000 | Freq: Every day | VAGINAL | 0 refills | Status: DC

## 2016-07-11 MED ORDER — ASPIRIN EC 81 MG PO TBEC: 81.0000 mg | DELAYED_RELEASE_TABLET | Freq: Every day | ORAL | 3 refills | Status: DC

## 2016-07-11 MED ORDER — VITAMIN D (ERGOCALCIFEROL) 1.25 MG (50000 UNIT) PO CAPS: 50000.0000 [IU] | ORAL_CAPSULE | ORAL | 0 refills | Status: DC

## 2016-07-11 NOTE — Telephone Encounter (Signed)
-----   Message from Salvadore Dom, MD sent at 07/11/2016 10:17 AM EDT ----- Please inform the patient that her vaginitis probe was + for BV and treat with flagyl (either oral or vaginal, her choice), no ETOH while on Flagyl.  Oral: Flagyl 500 mg BID x 7 days, or Vaginal: Metrogel, 1 applicator per vagina q day x 5 days. Her vit d level was also very low. Please call in 50,000 IU of vit D3 q7days x 12 weeks. Then she needs to come back for a f/u vit D level. Her other blood work was normal. Cervical cultures and pap are pending

## 2016-07-11 NOTE — Progress Notes (Signed)
Cardiology Office Note    Date:  07/12/2016   ID:  Audrey Gross, DOB 1961-09-30, MRN [id number]  PCP:  [name], MD  Cardiologist: Sanda Klein, MD   Chief Complaint  Patient presents with  . New Evaluation    pt states her heart feels weird and bradycardia, states she says heart murmor     History of Present Illness:  Audrey Gross is a 55 y.o. female has been describing palpitations. These have been occurring almost daily for about 8 months.  She wore a 48-hour Holter monitor which shows frequent episodes of brief atrial fibrillation with rapid ventricular response. These often occur in very brief bursts suggestive of pulmonary vein tachycardia. Often there is aberrancy with brief bursts of wide complex tachycardia. The altered monitor also shows mild underlying sinus bradycardia, but never lower than 50 bpm.  She reports that her palpitations became worse after she started taking hydralazine as an antihypertensive. Denies dizziness, lightheadedness, syncope, shortness of breath or angina pectoris. She has not had leg edema or intermittent claudication. She denies any focal neurological deficits that could be interpreted as representing transient ischemic attack or stroke. She has not had any bleeding problems other than menorrhagia.  She has chronic lymphocytic leukemia well compensated on ibrutinib. Her most recent hemoglobin was 12.7 and platelet count 215,000, white blood cell count 30,000. She has long-standing systemic hypertension that does not always been well treated. She does not have known obstructive sleep apnea and denies daytime hypersomnolence. She does not have diabetes mellitus, known coronary or peripheral vascular disease. She does have mild hypertriglyceridemia. Recent labs showed normal thyroid function. She is approaching menopause and has had some issues with dysfunctional uterine bleeding and has recently been prescribed medroxyprogesterone.  Looking over  her medication history in the electronic medical record it is obvious that she has had difficulty with blood pressure medications. These have been frequently adjusted and changed. Previous medications that she has taken include lisinopril, hydrochlorothiazide, carvedilol, bystolic, labetalol, amlodipine and amlodipine-olmesartan and amlodipine-olmesartan-hydrochlorothiazide. None of these medications are listed under allergies and it's not always clear why they have been changed.  There is a report that her beta blocker was stopped due to bradycardia in Dr. Calton Dach note. Is also a note reporting hypokalemia as a problem and this was probably the reason for stopping her diuretic. Continues to take a potassium supplement despite being off diuretics.  Past Medical History:  Diagnosis Date  . CLL (chronic lymphoblastic leukemia)    Leukemia  . Cystic breast   . Depression   . Diffuse cystic mastopathy   . Dyslipidemia (high LDL; low HDL)   . Hypertension   . Iron deficiency anemia, unspecified   . Lymphadenopathy of head and neck 04/05/2015  . Medical non-compliance   . Systolic ejection murmur     Past Surgical History:  Procedure Laterality Date  . TUBAL LIGATION      Current Medications: Outpatient Medications Prior to Visit  Medication Sig Dispense Refill  . IMBRUVICA 140 MG capsul TAKE 3 CAPSULES BY MOUTH DAILY 90 capsule 3  . potassium chloride SA (K-DUR,KLOR-CON) 20 MEQ tablet Take 1 tablet (20 mEq total) by mouth 2 (two) times daily. 60 tablet 5  . hydrALAZINE (APRESOLINE) 50 MG tablet Take 1 tablet (50 mg total) by mouth 3 (three) times daily. 270 tablet 1  . medroxyPROGESTERone (PROVERA) 5 MG tablet 1 tablet po qd x 5 days every other month if no spontaneous menses (Patient not taking:  Reported on 07/11/2016) 15 tablet 1  . amLODipine (NORVASC) 10 MG tablet Take 1 tablet (10 mg total) by mouth daily. (Patient not taking: Reported on 07/11/2016) 90 tablet 9   No  facility-administered medications prior to visit.      Allergies:   Review of patient's allergies indicates no known allergies.   Social History   Social History  . Marital status: Legally Separated    Spouse name: N/A  . Number of children: 6  . 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 Smoking status: Never Smoker  . Smokeless tobacco: Never Used  . Alcohol use No  . Drug use: No  . Sexual activity: Yes    Partners: Male    Birth control/ protection: Surgical   Other Topics Concern  . None   Social History Narrative  . None     Family History:  The patient's family history includes Heart disease in her brother and maternal grandmother; Kidney disease in her father; Stomach cancer in her maternal aunt and paternal grandmother; Sudden death in her mother.   ROS:   Please see the history of present illness.    ROS All other systems reviewed and are negative.   PHYSICAL EXAM:   VS:  BP (!) 150/85 (BP Location: Right Arm, Patient Position: Standing, Cuff Size: Large)   Pulse (!) 57   Ht 5\' 7"  (1.702 m)   Wt 196 lb 3.2 oz (89 kg)   LMP 06/15/2016   SpO2 98%   BMI 30.73 kg/m    GEN: Well nourished, well developed, in no acute distress  HEENT: normal  Neck: no JVD, carotid bruits, or masses Cardiac: RRR; [id number] Systolic ejection murmur; no diastolic s, rubs, or gallops,no edema  Respiratory:  clear to auscultation bilaterally, normal work of breathing GI: soft, nontender, nondistended, + BS MS: no deformity or atrophy  Skin: warm and dry, no rash Neuro:  Alert and Oriented x 3, Strength and sensation are intact Psych: euthymic mood, full affect  Wt Readings from Last 3 Encounters:  07/11/16 196 lb 3.2 oz (89 kg)  07/10/16 200 lb (90.7 kg)  06/11/16 198 lb (89.8 kg)      Studies/Labs Reviewed:   EKG:  EKG is ordered today.  The ekg ordered today demonstrates Sinus bradycardia with biatrial enlargement  and probable left ventricular hypertrophy, QTC 432 ms  ECHO: Moderate left ventricular hypertrophy, EF 55-60%, no significant valvular abnormalities.  I have some disagreements with the report. The left atrial size is described as "normal" but both left atrial end-systolic diameter and left atrial volume index are consistent with a mildly dilated left atrium. In addition the diastolic function is described as normal whereas in my interpretation the values are more consistent with pseudonormalization (although the E/e' is in the gray zone). The report measurement section erroneously lists a TR velocity of over 500 cm/s. This was a measurement of the MR jet  Recent Labs: 04/07/2016: TSH 0.68 05/06/2016: ALT 12; HGB 12.7; Platelets 215 06/11/2016: BUN 10; Creatinine, Ser 0.81; Magnesium 2.0; Potassium 4.4; Sodium 140   Lipid Panel    Component Value Date/Time   CHOL 177 06/11/2016 1206   TRIG 181.0 (H) 06/11/2016 1206   HDL 44.50 06/11/2016 1206   CHOLHDL 4 06/11/2016 1206   VLDL 36.2 06/11/2016 1206   LDLCALC 96 06/11/2016 1206          ASSESSMENT:    1.  Paroxysmal atrial fibrillation (HCC)   2. Essential hypertension, benign   3. Hypertensive heart disease without heart failure   4. CLL (chronic lymphocytic leukemia) ([place])   5. Murmur, cardiac   6. Hypertriglyceridemia without hypercholesterolemia   7. Mild obesity      PLAN:  In order of problems listed above:  1. AFib: I spent a long time with Mrs. Musleh reviewing the mechanism of atrial fibrillation, the increased risk of stroke and the different methods of management including rate control, rhythm control and even ablation. She immediately experienced a lot of skepticism about the treatment plan I suggested. She has the to start with "diet and exercise". She was particularly worried about the use of anticoagulants and requested that I discussed this with her hematologist, which is of course a very reasonable request. While in the  long run her risk of embolic stroke is elevated (CHADSVasc2 - gender, HTN), the immediate risk is relatively low. I think it'll take some time to build trust and open communication with her and chose not to push the issue today. The same reason, it will be hard to convince her about the benefits of a rhythm control strategy that would include potentially toxic antiarrhythmic drugs or invasive procedures such as ablation. 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 2. HTN: Her medications have had to be repeatedly readjusted due to bradycardia and hypokalemia, possibly other side effects or ineffective agents as well. She definitely needs a medication for rate control at least. I have chosen to prescribe carvedilol since it will provide both blood pressure and rate control. She has had some bradycardia on the Holter monitor, but does not have any symptoms attributable to bradycardia. We'll have to wait and see how she does with the beta blocker. 3. Diastolic dysfunction: There was clear abnormal relaxation on her echo from 2013 (E: A ratio 0.9, mitral valve deceleration time 292 ms) and she has moderate LVH. I believe diastolic dysfunction is also evident on the more recent echocardiogram, despite the report. In fact there is evidence of elevated filling pressures, although clinically she does not have heart failure. 4. CLL: appears to be well treated on a well-tolerated biological agent. She does not have thrombocytopenia and I see no reason why she could not take antiplatelet or anticoagulation agents. Will definitely confer with Dr. Alvy Bimler. 5. Murmur: No significant valvular abnormalities are seen on either of her echocardiograms in 2013 or 2017. 6. HLP: Mild, managed with diet and exercise. Does not require medications. 7. Obesity: Weight loss and regular exercise are  recommended.    Medication Adjustments/Labs and Tests Ordered: Current medicines are reviewed at length with the patient today.  Concerns regarding medicines are outlined above.  Medication changes, Labs and Tests ordered today are listed in the Patient Instructions below. Patient Instructions  Dr Sallyanne Kuster has recommended making the following medication changes: 1. STOP Hydralazine 2. START Carvedilol 6.25 mg - take 1 tablet by mouth twice daily 3. START Aspirin 81 mg - take 1 tablet by mouth daily  Dr Sallyanne Kuster recommends that you schedule a follow-up appointment in 1 month.  If you need a refill on your cardiac medications before your next appointment, please call your pharmacy.    Signed, Sanda Klein, MD  07/12/2016 11:22 AM    [facility] [address], [place], [place]  [address] Phone: [phone])  [phone]; Fax: [phone]

## 2016-07-11 NOTE — Telephone Encounter (Signed)
Spoke with patient. Advised of message as seen below from [place]. Patient is agreeable and verbalizes understanding. Rx for Metrogel 1 applicator per vagina q day x 5 days and Vitamin D 50,000 take 1 tablet q 7 days #12 0RF sent to pharmacy on file. 3 month lab recheck scheduled for 10/17/2016 at 8:30 am. Patient is agreeable to date and time.  Routing to provider for final review. Patient agreeable to disposition. Will close encounter.

## 2016-07-11 NOTE — Patient Instructions (Signed)
Dr Sallyanne Kuster has recommended making the following medication changes: 1. STOP Hydralazine 2. START Carvedilol 6.25 mg - take 1 tablet by mouth twice daily 3. START Aspirin 81 mg - take 1 tablet by mouth daily  Dr Sallyanne Kuster recommends that you schedule a follow-up appointment in 1 month.  If you need a refill on your cardiac medications before your next appointment, please call your pharmacy.

## 2016-07-12 DIAGNOSIS — E781 Pure hyperglyceridemia: Secondary | ICD-10-CM | POA: Insufficient documentation

## 2016-07-23 ENCOUNTER — Encounter: Payer: Self-pay | Admitting: Internal Medicine

## 2016-07-23 ENCOUNTER — Ambulatory Visit (INDEPENDENT_AMBULATORY_CARE_PROVIDER_SITE_OTHER): Payer: BLUE CROSS/BLUE SHIELD | Admitting: Internal Medicine

## 2016-07-23 VITALS — BP 180/92 | HR 68 | Temp 97.6°F | Resp 16 | Ht 67.0 in | Wt 196.5 lb

## 2016-07-23 DIAGNOSIS — I119 Hypertensive heart disease without heart failure: Secondary | ICD-10-CM

## 2016-07-23 DIAGNOSIS — I10 Essential (primary) hypertension: Secondary | ICD-10-CM | POA: Diagnosis not present

## 2016-07-23 MED ORDER — TELMISARTAN-HCTZ 80-25 MG PO TABS: 1.0000 | ORAL_TABLET | Freq: Every day | ORAL | 1 refills | Status: DC

## 2016-07-23 NOTE — Progress Notes (Signed)
Pre visit review using our clinic review tool, if applicable. No additional management support is needed unless otherwise documented below in the visit note. 

## 2016-07-23 NOTE — Progress Notes (Signed)
Subjective:  Patient ID: Audrey Gross, female    DOB: 09-Feb-1961  Age: 55 y.o. MRN: [id number]  CC: Hypertension   HPI COCOA LEICK presents for a blood pressure check. She tells me her blood pressure has not been well controlled. She recently saw a cardiologist for palpitations and atrial fibrillation and her blood pressure medicine was changed to a single agent, carvedilol. She is also being treated for vitamin D deficiency. She feels well and denies headache, blurred vision, chest pain, shortness of breath, palpitations, edema, syncope, or fatigue. It was decided that she would not be on anticoagulation for the atrial fibrillation.  Outpatient Medications Prior to Visit  Medication Sig Dispense Refill  . aspirin EC 81 MG tablet Take 1 tablet (81 mg total) by mouth daily. 90 tablet 3  . carvedilol (COREG) 6.25 MG tablet Take 1 tablet (6.25 mg total) by mouth 2 (two) times daily. 180 tablet 3  . IMBRUVICA 140 MG capsul TAKE 3 CAPSULES BY MOUTH DAILY 90 capsule 3  . metroNIDAZOLE (METROGEL) 0.75 % vaginal gel Place 1 Applicatorful vaginally at bedtime. X 5 days 70 g 0  . potassium chloride SA (K-DUR,KLOR-CON) 20 MEQ tablet Take 1 tablet (20 mEq total) by mouth 2 (two) times daily. 60 tablet 5  . Vitamin D, Ergocalciferol, (DRISDOL) 50000 units CAPS capsule Take 1 capsule (50,000 Units total) by mouth every 7 (seven) days. 12 capsule 0  . medroxyPROGESTERone (PROVERA) 5 MG tablet 1 tablet po qd x 5 days every other month if no spontaneous menses (Patient not taking: Reported on 07/23/2016) 15 tablet 1   No facility-administered medications prior to visit.     ROS Review of Systems  Constitutional: Negative.  Negative for activity change, appetite change, diaphoresis, fatigue and unexpected weight change.  HENT: Negative.   Eyes: Negative for photophobia and visual disturbance.  Respiratory: Negative.  Negative for cough, choking, chest tightness, shortness of breath and stridor.     Cardiovascular: Negative.  Negative for chest pain, palpitations and leg swelling.  Gastrointestinal: Negative.  Negative for abdominal pain, constipation, diarrhea, nausea and vomiting.  Endocrine: Negative.  Negative for polydipsia, polyphagia and polyuria.  Genitourinary: Negative.   Musculoskeletal: Negative.  Negative for arthralgias, back pain, joint swelling, myalgias and neck pain.  Skin: Negative.   Allergic/Immunologic: Negative.   Neurological: Negative.  Negative for dizziness, weakness and light-headedness.  Hematological: Negative.  Negative for adenopathy. Does not bruise/bleed easily.  Psychiatric/Behavioral: Negative.     Objective:  BP (!) 180/92 (BP Location: Left Arm, Patient Position: Sitting, Cuff Size: Normal)   Pulse 68   Temp 97.6 F (36.4 C) (Oral)   Resp 16   Ht 5\' 7"  (1.702 m)   Wt 196 lb 8 oz (89.1 kg)   LMP 06/15/2016   SpO2 98%   BMI 30.78 kg/m   BP Readings from Last 3 Encounters:  07/23/16 (!) 180/92  07/11/16 (!) 150/85  07/10/16 (!) 149/90    Wt Readings from Last 3 Encounters:  07/23/16 196 lb 8 oz (89.1 kg)  07/11/16 196 lb 3.2 oz (89 kg)  07/10/16 200 lb (90.7 kg)    Physical Exam  Constitutional: She is oriented to person, place, and time. No distress.  HENT:  Head: Normocephalic and atraumatic.  Mouth/Throat: Oropharynx is clear and moist. No oropharyngeal exudate.  Eyes: Conjunctivae are normal. Right eye exhibits no discharge. Left eye exhibits no discharge. No scleral icterus.  Neck: Normal range of motion. Neck supple. No JVD  present. No tracheal deviation present. No thyromegaly present.  Cardiovascular: Normal rate, regular rhythm, normal heart sounds and intact distal pulses.  Exam reveals no gallop and no friction rub.   No murmur heard. Pulmonary/Chest: Effort normal and breath sounds normal. No stridor. No respiratory distress. She has no wheezes. She has no rales. She exhibits no tenderness.  Abdominal: Soft. Bowel  sounds are normal. She exhibits no distension and no mass. There is no tenderness. There is no rebound and no guarding.  Musculoskeletal: Normal range of motion. She exhibits no edema, tenderness or deformity.  Lymphadenopathy:    She has no cervical adenopathy.  Neurological: She is oriented to person, place, and time.  Skin: Skin is warm and dry. No rash noted. She is not diaphoretic. No erythema. No pallor.  Vitals reviewed.   Lab Results  Component Value Date   WBC 29.9 (H) 05/06/2016   HGB 12.7 05/06/2016   HCT 40.6 05/06/2016   PLT 215 05/06/2016   GLUCOSE 82 06/11/2016   CHOL 177 06/11/2016   TRIG 181.0 (H) 06/11/2016   HDL 44.50 06/11/2016   LDLDIRECT 144.5 07/26/2013   LDLCALC 96 06/11/2016   ALT 12 05/06/2016   AST 17 05/06/2016   NA 140 06/11/2016   K 4.4 06/11/2016   CL 105 06/11/2016   CREATININE 0.81 06/11/2016   BUN 10 06/11/2016   CO2 28 06/11/2016   TSH 0.68 04/07/2016    Mm Digital Screening Bilateral  Result Date: 05/06/2016 CLINICAL DATA:  Screening. EXAM: DIGITAL SCREENING BILATERAL MAMMOGRAM WITH CAD COMPARISON:  Previous exam(s). ACR Breast Density Category c: The breast tissue is heterogeneously dense, which may obscure small masses. FINDINGS: There are no findings suspicious for malignancy. Images were processed with CAD. IMPRESSION: No mammographic evidence of malignancy. A result letter of this screening mammogram will be mailed directly to the patient. RECOMMENDATION: Screening mammogram in one year. (Code:SM-B-01Y) BI-RADS CATEGORY  1: Negative. Electronically Signed   By: Ammie Ferrier M.D.   On: 05/06/2016 15:17    Assessment & Plan:   Kellis was seen today for hypertension.  Diagnoses and all orders for this visit:  Essential hypertension- as below -     telmisartan-hydrochlorothiazide (MICARDIS HCT) 80-25 MG tablet; Take 1 tablet by mouth daily.  Hypertensive heart disease without heart failure- Her blood pressure is not adequately well  controlled but fortunately she is asymptomatic. I encouraged her to be aggressive about her vitamin D replacement therapy as this will help lower her blood pressure. For now, she will continue carvedilol but I've also asked her to add an ARB and thiazide diuretic for better blood pressure reduction. -     telmisartan-hydrochlorothiazide (MICARDIS HCT) 80-25 MG tablet; Take 1 tablet by mouth daily.   I have discontinued Ms. Tavis's medroxyPROGESTERone. I am also having her start on telmisartan-hydrochlorothiazide. Additionally, I am having her maintain her potassium chloride SA, IMBRUVICA, carvedilol, aspirin EC, Vitamin D (Ergocalciferol), and metroNIDAZOLE.  Meds ordered this encounter  Medications  . telmisartan-hydrochlorothiazide (MICARDIS HCT) 80-25 MG tablet    Sig: Take 1 tablet by mouth daily.    Dispense:  90 tablet    Refill:  1     Follow-up: Return in about 4 weeks (around 08/20/2016).  [name], MD

## 2016-07-23 NOTE — Patient Instructions (Signed)
Hypertension Hypertension, commonly called high blood pressure, is when the force of blood pumping through your arteries is too strong. Your arteries are the blood vessels that carry blood from your heart throughout your body. A blood pressure reading consists of a higher number over a lower number, such as 110/72. The higher number (systolic) is the pressure inside your arteries when your heart pumps. The lower number (diastolic) is the pressure inside your arteries when your heart relaxes. Ideally you want your blood pressure below 120/80. Hypertension forces your heart to work harder to pump blood. Your arteries may become narrow or stiff. Having untreated or uncontrolled hypertension can cause heart attack, stroke, kidney disease, and other problems. RISK FACTORS Some risk factors for high blood pressure are controllable. Others are not.  Risk factors you cannot control include:   Race. You may be at higher risk if you are African American.  Age. Risk increases with age.  Gender. Men are at higher risk than women before age 45 years. After age 65, women are at higher risk than men. Risk factors you can control include:  Not getting enough exercise or physical activity.  Being overweight.  Getting too much fat, sugar, calories, or salt in your diet.  Drinking too much alcohol. SIGNS AND SYMPTOMS Hypertension does not usually cause signs or symptoms. Extremely high blood pressure (hypertensive crisis) may cause headache, anxiety, shortness of breath, and nosebleed. DIAGNOSIS To check if you have hypertension, your health care provider will measure your blood pressure while you are seated, with your arm held at the level of your heart. It should be measured at least twice using the same arm. Certain conditions can cause a difference in blood pressure between your right and left arms. A blood pressure reading that is higher than normal on one occasion does not mean that you need treatment. If  it is not clear whether you have high blood pressure, you may be asked to return on a different day to have your blood pressure checked again. Or, you may be asked to monitor your blood pressure at home for 1 or more weeks. TREATMENT Treating high blood pressure includes making lifestyle changes and possibly taking medicine. Living a healthy lifestyle can help lower high blood pressure. You may need to change some of your habits. Lifestyle changes may include:  Following the DASH diet. This diet is high in fruits, vegetables, and whole grains. It is low in salt, red meat, and added sugars.  Keep your sodium intake below 2,300 mg per day.  Getting at least 30-45 minutes of aerobic exercise at least 4 times per week.  Losing weight if necessary.  Not smoking.  Limiting alcoholic beverages.  Learning ways to reduce stress. Your health care provider may prescribe medicine if lifestyle changes are not enough to get your blood pressure under control, and if one of the following is true:  You are 18-59 years of age and your systolic blood pressure is above 140.  You are 60 years of age or older, and your systolic blood pressure is above 150.  Your diastolic blood pressure is above 90.  You have diabetes, and your systolic blood pressure is over 140 or your diastolic blood pressure is over 90.  You have kidney disease and your blood pressure is above 140/90.  You have heart disease and your blood pressure is above 140/90. Your personal target blood pressure may vary depending on your medical conditions, your age, and other factors. HOME CARE INSTRUCTIONS    Have your blood pressure rechecked as directed by your health care provider.   Take medicines only as directed by your health care provider. Follow the directions carefully. Blood pressure medicines must be taken as prescribed. The medicine does not work as well when you skip doses. Skipping doses also puts you at risk for  problems.  Do not smoke.   Monitor your blood pressure at home as directed by your health care provider. SEEK MEDICAL CARE IF:   You think you are having a reaction to medicines taken.  You have recurrent headaches or feel dizzy.  You have swelling in your ankles.  You have trouble with your vision. SEEK IMMEDIATE MEDICAL CARE IF:  You develop a severe headache or confusion.  You have unusual weakness, numbness, or feel faint.  You have severe chest or abdominal pain.  You vomit repeatedly.  You have trouble breathing. MAKE SURE YOU:   Understand these instructions.  Will watch your condition.  Will get help right away if you are not doing well or get worse.   This information is not intended to replace advice given to you by your health care provider. Make sure you discuss any questions you have with your health care provider.   Document Released: 09/22/2005 Document Revised: 02/06/2015 Document Reviewed: 07/15/2013 Elsevier Interactive Patient Education 2016 [employer] Inc[employer]  

## 2016-08-01 MED FILL — IMBRUVICA 140 MG CAPSULE: 140 | 30 days supply | Qty: 90 | Fill #1

## 2016-08-12 DIAGNOSIS — E669 Obesity, unspecified: Secondary | ICD-10-CM | POA: Insufficient documentation

## 2016-08-12 NOTE — Progress Notes (Signed)
Cardiology Office Note    Date:  08/13/2016   ID:  Audrey Gross, DOB June 23, 1961, MRN [id number]  PCP:  [name], MD  Cardiologist: Sanda Klein, MD   Chief Complaint  Patient presents with  . Follow-up    History of Present Illness:  Audrey Gross is a 55 y.o. 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   Recent Holter monitor showed frequent bursts of rapid atrial rates, sometimes with aberrancy, possibly pulmonary vein tachycardia leading to atrial fibrillation, on a background of mild sinus bradycardia.  She is only troubled by palpitations and denies dyspnea, chest pain, dizziness or syncope related to the arrhythmia.  Denies dizziness, lightheadedness, syncope, shortness of breath or angina pectoris. She has not had leg edema or intermittent claudication. She denies any focal neurological deficits that could be interpreted as representing transient ischemic attack or stroke. She has not had any bleeding problems other than menorrhagia.  She has chronic lymphocytic leukemia well compensated on ibrutinib. She has long-standing systemic hypertension that has not always been well treated. She does not have diabetes mellitus, known coronary or peripheral vascular disease. She does have mild hypertriglyceridemia. Recent labs showed normal thyroid function. She is approaching menopause and has had some issues with dysfunctional uterine bleeding and has recently been prescribed medroxyprogesterone.  We started carvedilol at her last appointment.Today her heart rate is 58 and she does not have any symptoms that could be attributed to bradycardia. Blood pressure control has been remarkably good.  Past Medical History:  Diagnosis Date  . CLL (chronic lymphoblastic leukemia)    Leukemia  . Cystic breast   . Depression   . Diffuse cystic mastopathy   . Dyslipidemia (high LDL; low HDL)   . Hypertension     . Iron deficiency anemia, unspecified   . Lymphadenopathy of head and neck 04/05/2015  . Medical non-compliance   . Systolic ejection murmur     Past Surgical History:  Procedure Laterality Date  . TUBAL LIGATION      Current Medications: Outpatient Medications Prior to Visit  Medication Sig Dispense Refill  . carvedilol (COREG) 6.25 MG tablet Take 1 tablet (6.25 mg total) by mouth 2 (two) times daily. 180 tablet 3  . IMBRUVICA 140 MG capsul TAKE 3 CAPSULES BY MOUTH DAILY 90 capsule 3  . potassium chloride SA (K-DUR,KLOR-CON) 20 MEQ tablet Take 1 tablet (20 mEq total) by mouth 2 (two) times daily. 60 tablet 5  . telmisartan-hydrochlorothiazide (MICARDIS HCT) 80-25 MG tablet Take 1 tablet by mouth daily. 90 tablet 1  . Vitamin D, Ergocalciferol, (DRISDOL) 50000 units CAPS capsule Take 1 capsule (50,000 Units total) by mouth every 7 (seven) days. 12 capsule 0  . aspirin EC 81 MG tablet Take 1 tablet (81 mg total) by mouth daily. 90 tablet 3  . metroNIDAZOLE (METROGEL) 0.75 % vaginal gel Place 1 Applicatorful vaginally at bedtime. X 5 days (Patient not taking: Reported on 08/13/2016) 70 g 0   No facility-administered medications prior to visit.      Allergies:   Patient has no known allergies.   Social History   Social History  . Marital status: Legally Separated    Spouse name: N/A  . Number of children: 6  . 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 Smoking status: Never Smoker  . Smokeless tobacco: Never Used  .  Alcohol use No  . Drug use: No  . Sexual activity: Yes    Partners: Male    Birth control/ protection: Surgical   Other Topics Concern  . None   Social History Narrative  . None     Family History:  The patient's family history includes Heart disease in her brother and maternal grandmother; Kidney disease in her father; Stomach cancer in her maternal aunt and paternal grandmother; Sudden death in her  mother.   ROS:   Please see the history of present illness.    ROS All other systems reviewed and are negative.   PHYSICAL EXAM:   VS:  BP 117/70 (BP Location: Right Arm, Patient Position: Sitting, Cuff Size: Large)   Pulse (!) 58   Ht 5\' 7"  (1.702 m)   Wt 195 lb 3.2 oz (88.5 kg)   SpO2 99%   BMI 30.57 kg/m    GEN: Well nourished, well developed, in no acute distress  HEENT: normal  Neck: no JVD, carotid bruits, or masses Cardiac: RRR; [id number] Systolic ejection murmur; no diastolic s, rubs, or gallops,no edema  Respiratory:  clear to auscultation bilaterally, normal work of breathing GI: soft, nontender, nondistended, + BS MS: no deformity or atrophy  Skin: warm and dry, no rash Neuro:  Alert and Oriented x 3, Strength and sensation are intact Psych: euthymic mood, full affect  Wt Readings from Last 3 Encounters:  08/13/16 195 lb 3.2 oz (88.5 kg)  07/23/16 196 lb 8 oz (89.1 kg)  07/11/16 196 lb 3.2 oz (89 kg)      Studies/Labs Reviewed:   EKG:  EKG is ordered today.  The ekg ordered today demonstrates Sinus bradycardia with biatrial enlargement and probable left ventricular hypertrophy, QTC 432 ms  ECHO: Moderate left ventricular hypertrophy, EF 55-60%, no significant valvular abnormalities.  I have some disagreements with the report. The left atrial size is described as "normal" but both left atrial end-systolic diameter and left atrial volume index are consistent with a mildly dilated left atrium. In addition the diastolic function is described as normal whereas in my interpretation the values are more consistent with pseudonormalization (although the E/e' is in the gray zone). The report measurement section erroneously lists a TR velocity of over 500 cm/s. This was a measurement of the MR jet  Recent Labs: 04/07/2016: TSH 0.68 05/06/2016: ALT 12; HGB 12.7; Platelets 215 06/11/2016: BUN 10; Creatinine, Ser 0.81; Magnesium 2.0; Potassium 4.4; Sodium 140   Lipid Panel      Component Value Date/Time   CHOL 177 06/11/2016 1206   TRIG 181.0 (H) 06/11/2016 1206   HDL 44.50 06/11/2016 1206   CHOLHDL 4 06/11/2016 1206   VLDL 36.2 06/11/2016 1206   LDLCALC 96 06/11/2016 1206          ASSESSMENT:    1. Paroxysmal atrial fibrillation (HCC)   2. Essential hypertension   3. Hypertensive heart disease without heart failure   4. CLL (chronic lymphocytic leukemia) ([place])   5. Dyslipidemia (high LDL; low HDL)   6. Class 1 obesity due to excess calories with serious comorbidity and body mass index (BMI) of 30.0 to 30.9 in adult      PLAN:  In order of problems listed above:  1. AFib: I spent a long time with Audrey Gross reviewing the relationship between atrial fibrillation and the increased risk of stroke. I strongly recommended that she start taking oral anticoagulants, but she remains skeptical. While in the long run her risk of  embolic stroke is elevated (CHADSVasc2 - gender, HTN), the immediate risk is relatively low. One of the problems is she seems to believe that her life expectancy is short and that she will not benefit from stroke prevention medication. I've asked her to discuss her prognosis and the benefit of anticoagulation with her hematologist at their upcoming appointment on Friday. As far as the symptoms of atrial fibrillation are concerned these are very mild and beyond the beta blocker that we have started I would not recommend any additional anti-arrhythmic intervention at this time. 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 2. HTN: Excellent control on carvedilol without significant/symptomatic bradycardia 3. 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 4. CLL: appears to be well treated on a well-tolerated biological agent. She does not have thrombocytopenia and I see no reason why she  could not take antiplatelet or anticoagulation agents. Encourage her to discuss anticoagulation therapy with Dr. Alvy Bimler. 5. HLP: Mild, managed with diet and exercise. Does not require medications. 6. Obesity: Weight loss and regular exercise are recommended.    Medication Adjustments/Labs and Tests Ordered: Current medicines are reviewed at length with the patient today.  Concerns regarding medicines are outlined above.  Medication changes, Labs and Tests ordered today are listed in the Patient Instructions below. Patient Instructions  Dr Sallyanne Kuster has recommended making the following medication changes: 1. STOP Aspirin 2. START Eliquis 5 mg - take 1 tablet by mouth twice daily  Your physician recommends that you schedule a follow-up appointment in 6 months with Dr Sallyanne Kuster. You will receive a reminder letter in the mail two months in advance. If you don't receive a letter, please call our office to schedule the follow-up appointment.  If you need a refill on your cardiac medications before your next appointment, please call your pharmacy.    Signed, Sanda Klein, MD  08/13/2016 9:00 AM    [address], [place], [place]  [address] Phone: [phone]; Fax: [phone]

## 2016-08-13 ENCOUNTER — Encounter: Payer: Self-pay | Admitting: Cardiovascular Disease

## 2016-08-13 ENCOUNTER — Ambulatory Visit (INDEPENDENT_AMBULATORY_CARE_PROVIDER_SITE_OTHER): Payer: BLUE CROSS/BLUE SHIELD | Admitting: Cardiovascular Disease

## 2016-08-13 VITALS — BP 117/70 | HR 58 | Ht 67.0 in | Wt 195.2 lb

## 2016-08-13 DIAGNOSIS — I119 Hypertensive heart disease without heart failure: Secondary | ICD-10-CM | POA: Diagnosis not present

## 2016-08-13 DIAGNOSIS — I480 Paroxysmal atrial fibrillation: Secondary | ICD-10-CM

## 2016-08-13 DIAGNOSIS — I10 Essential (primary) hypertension: Secondary | ICD-10-CM | POA: Diagnosis not present

## 2016-08-13 DIAGNOSIS — Z6830 Body mass index (BMI) 30.0-30.9, adult: Secondary | ICD-10-CM

## 2016-08-13 DIAGNOSIS — C9110 Chronic lymphocytic leukemia of B-cell type not having achieved remission: Secondary | ICD-10-CM

## 2016-08-13 DIAGNOSIS — E784 Other hyperlipidemia: Secondary | ICD-10-CM

## 2016-08-13 DIAGNOSIS — E785 Hyperlipidemia, unspecified: Secondary | ICD-10-CM

## 2016-08-13 DIAGNOSIS — E6609 Other obesity due to excess calories: Secondary | ICD-10-CM

## 2016-08-13 MED ORDER — APIXABAN 5 MG PO TABS
5.0000 mg | ORAL_TABLET | Freq: Two times a day (BID) | ORAL | 11 refills | Status: DC
Start: 2016-08-13 — End: 2017-08-27

## 2016-08-13 NOTE — Progress Notes (Signed)
Thank you :)

## 2016-08-13 NOTE — Patient Instructions (Signed)
Dr Sallyanne Kuster has recommended making the following medication changes: 1. STOP Aspirin 2. START Eliquis 5 mg - take 1 tablet by mouth twice daily  Your physician recommends that you schedule a follow-up appointment in 6 months with Dr Sallyanne Kuster. You will receive a reminder letter in the mail two months in advance. If you don't receive a letter, please call our office to schedule the follow-up appointment.  If you need a refill on your cardiac medications before your next appointment, please call your pharmacy.

## 2016-08-15 ENCOUNTER — Telehealth: Payer: Self-pay | Admitting: Hematology and Oncology

## 2016-08-15 ENCOUNTER — Other Ambulatory Visit (HOSPITAL_BASED_OUTPATIENT_CLINIC_OR_DEPARTMENT_OTHER): Payer: BLUE CROSS/BLUE SHIELD

## 2016-08-15 ENCOUNTER — Encounter: Payer: Self-pay | Admitting: Hematology and Oncology

## 2016-08-15 ENCOUNTER — Ambulatory Visit (HOSPITAL_BASED_OUTPATIENT_CLINIC_OR_DEPARTMENT_OTHER): Payer: BLUE CROSS/BLUE SHIELD | Admitting: Hematology and Oncology

## 2016-08-15 VITALS — BP 151/77 | HR 52 | Temp 97.3°F | Resp 18 | Ht 67.0 in | Wt 196.9 lb

## 2016-08-15 DIAGNOSIS — C9110 Chronic lymphocytic leukemia of B-cell type not having achieved remission: Secondary | ICD-10-CM

## 2016-08-15 DIAGNOSIS — I480 Paroxysmal atrial fibrillation: Secondary | ICD-10-CM

## 2016-08-15 DIAGNOSIS — I10 Essential (primary) hypertension: Secondary | ICD-10-CM

## 2016-08-15 DIAGNOSIS — I4891 Unspecified atrial fibrillation: Secondary | ICD-10-CM

## 2016-08-15 DIAGNOSIS — E876 Hypokalemia: Secondary | ICD-10-CM

## 2016-08-15 DIAGNOSIS — Z7901 Long term (current) use of anticoagulants: Secondary | ICD-10-CM

## 2016-08-15 LAB — CBC WITH DIFFERENTIAL/PLATELET
BASO%: 0.9 % (ref 0.0–2.0)
Basophils Absolute: 0.2 10*3/uL — ABNORMAL HIGH (ref 0.0–0.1)
EOS ABS: 0.1 10*3/uL (ref 0.0–0.5)
EOS%: 0.5 % (ref 0.0–7.0)
HCT: 35.4 % (ref 34.8–46.6)
HGB: 11.1 g/dL — ABNORMAL LOW (ref 11.6–15.9)
LYMPH%: 81.6 % — AB (ref 14.0–49.7)
MCH: 25.3 pg (ref 25.1–34.0)
MCHC: 31.4 g/dL — AB (ref 31.5–36.0)
MCV: 80.4 fL (ref 79.5–101.0)
MONO#: 0.7 10*3/uL (ref 0.1–0.9)
MONO%: 2.6 % (ref 0.0–14.0)
NEUT%: 14.4 % — AB (ref 38.4–76.8)
NEUTROS ABS: 3.9 10*3/uL (ref 1.5–6.5)
PLATELETS: 223 10*3/uL (ref 145–400)
RBC: 4.41 10*6/uL (ref 3.70–5.45)
RDW: 16.1 % — ABNORMAL HIGH (ref 11.2–14.5)
WBC: 26.8 10*3/uL — AB (ref 3.9–10.3)
lymph#: 21.9 10*3/uL — ABNORMAL HIGH (ref 0.9–3.3)

## 2016-08-15 LAB — COMPREHENSIVE METABOLIC PANEL
ALT: 10 U/L (ref 0–55)
ANION GAP: 11 meq/L (ref 3–11)
AST: 17 U/L (ref 5–34)
Albumin: 3.8 g/dL (ref 3.5–5.0)
Alkaline Phosphatase: 52 U/L (ref 40–150)
BILIRUBIN TOTAL: 0.86 mg/dL (ref 0.20–1.20)
BUN: 15.7 mg/dL (ref 7.0–26.0)
CHLORIDE: 105 meq/L (ref 98–109)
CO2: 26 meq/L (ref 22–29)
Calcium: 9.6 mg/dL (ref 8.4–10.4)
Creatinine: 0.8 mg/dL (ref 0.6–1.1)
GLUCOSE: 105 mg/dL (ref 70–140)
POTASSIUM: 3.2 meq/L — AB (ref 3.5–5.1)
SODIUM: 142 meq/L (ref 136–145)
TOTAL PROTEIN: 7.1 g/dL (ref 6.4–8.3)

## 2016-08-15 LAB — TECHNOLOGIST REVIEW

## 2016-08-15 NOTE — Assessment & Plan Note (Signed)
The patient has been consistent taking Ibrutinib since the last time I saw her. She tolerated treatment well without major side effects. Clinically, she is responding to treatment. Lymphadenopathy had resolved but she has persistent leukocytosis I will see her in 3 months for further review along with imaging study for objective assessment to treatment

## 2016-08-15 NOTE — Assessment & Plan Note (Signed)
The patient was recently diagnosed with atrial fibrillation She is skeptical about starting on anticoagulation therapy due to potential side effects of stroke I told her the reason for anticoagulation therapy is to prevent stroke and the risk of bleeding is estimated to be in the regional 3% per year I encouraged her to take the medication as prescribed

## 2016-08-15 NOTE — Assessment & Plan Note (Signed)
She is taking potassium replacement therapy. Continue.

## 2016-08-15 NOTE — Progress Notes (Signed)
[facility] OFFICE PROGRESS NOTE  Patient Care Team: Janith Lima, MD as PCP - General (Internal Medicine)  SUMMARY OF ONCOLOGIC HISTORY:   CLL (chronic lymphocytic leukemia) ([place])   04/05/2015 Pathology Results    Accession: [id number] flow cytometry confirmed CLL. FISH was positive for p53 mutation      04/24/2015 Imaging    Extensive lymphadenopathy throughout the neck, chest (axilla), abdomen and pelvis, as detailed above, compatible with the reported clinical history of lymphoma. 2. Mild splenomegaly.      05/03/2015 - 08/27/2015 Chemotherapy    She started on Ibrutinib, discontinued prematurely when her prescription ran out      10/10/2015 -  Chemotherapy    She was restarted back on Ibrutinib       INTERVAL HISTORY: Please see below for problem oriented charting. She returns for follow-up She was recently diagnosed with atrial fibrillation and was recommended to start chronic anticoagulation therapy She denies new lymphadenopathy She stated she has been compliant taking her medication as directed She denies new lymphadenopathy Denies recent infection  REVIEW OF SYSTEMS:   Constitutional: Denies fevers, chills or abnormal weight loss Eyes: Denies blurriness of vision Ears, nose, mouth, throat, and face: Denies mucositis or sore throat Respiratory: Denies cough, dyspnea or wheezes Cardiovascular: Denies palpitation, chest discomfort or lower extremity swelling Gastrointestinal:  Denies nausea, heartburn or change in bowel habits Skin: Denies abnormal skin rashes Lymphatics: Denies new lymphadenopathy or easy bruising Neurological:Denies numbness, tingling or new weaknesses Behavioral/Psych: Mood is stable, no new changes  All other systems were reviewed with the patient and are negative.  I have reviewed the past medical history, past surgical history, social history and family history with the patient and they are unchanged from previous  note.  ALLERGIES:  has No Known Allergies.  MEDICATIONS:  Current Outpatient Prescriptions  Medication Sig Dispense Refill  . carvedilol (COREG) 6.25 MG tablet Take 1 tablet (6.25 mg total) by mouth 2 (two) times daily. 180 tablet 3  . IMBRUVICA 140 MG capsul TAKE 3 CAPSULES BY MOUTH DAILY 90 capsule 3  . medroxyPROGESTERone (PROVERA) 5 MG tablet     . potassium chloride SA (K-DUR,KLOR-CON) 20 MEQ tablet Take 1 tablet (20 mEq total) by mouth 2 (two) times daily. 60 tablet 5  . telmisartan-hydrochlorothiazide (MICARDIS HCT) 80-25 MG tablet Take 1 tablet by mouth daily. 90 tablet 1  . Vitamin D, Ergocalciferol, (DRISDOL) 50000 units CAPS capsule Take 1 capsule (50,000 Units total) by mouth every 7 (seven) days. 12 capsule 0  . apixaban (ELIQUIS) 5 MG TABS tablet Take 1 tablet (5 mg total) by mouth 2 (two) times daily. (Patient not taking: Reported on 08/15/2016) 60 tablet 11   No current facility-administered medications for this visit.     PHYSICAL EXAMINATION: ECOG PERFORMANCE STATUS: 0 - Asymptomatic  Vitals:   08/15/16 1134  BP: (!) 151/77  Pulse: (!) 52  Resp: 18  Temp: 97.3 F (36.3 C)   Filed Weights   08/15/16 1134  Weight: 196 lb 14.4 oz (89.3 kg)    GENERAL:alert, no distress and comfortable SKIN: skin color, texture, turgor are normal, no rashes or significant lesions EYES: normal, Conjunctiva are pink and non-injected, sclera clear OROPHARYNX:no exudate, no erythema and lips, buccal mucosa, and tongue normal  NECK: supple, thyroid normal size, non-tender, without nodularity LYMPH:  She has palpable lymphadenopathy, very small in the cervical region on exam LUNGS: clear to auscultation and percussion with normal breathing effort HEART: regular rate With  soft systolic murmurs and no lower extremity edema ABDOMEN:abdomen soft, non-tender and normal bowel sounds Musculoskeletal:no cyanosis of digits and no clubbing  NEURO: alert & oriented x 3 with fluent speech, no  focal motor/sensory deficits  LABORATORY DATA:  I have reviewed the data as listed    Component Value Date/Time   NA 142 08/15/2016 1119   K 3.2 (L) 08/15/2016 1119   CL 105 06/11/2016 1206   CL 105 01/18/2013 0912   CO2 26 08/15/2016 1119   GLUCOSE 105 08/15/2016 1119   GLUCOSE 83 01/18/2013 0912   BUN 15.7 08/15/2016 1119   CREATININE 0.8 08/15/2016 1119   CALCIUM 9.6 08/15/2016 1119   PROT 7.1 08/15/2016 1119   ALBUMIN 3.8 08/15/2016 1119   AST 17 08/15/2016 1119   ALT 10 08/15/2016 1119   ALKPHOS 52 08/15/2016 1119   BILITOT 0.86 08/15/2016 1119    No results found for: SPEP, UPEP  Lab Results  Component Value Date   WBC 26.8 (H) 08/15/2016   NEUTROABS 3.9 08/15/2016   HGB 11.1 (L) 08/15/2016   HCT 35.4 08/15/2016   MCV 80.4 08/15/2016   PLT 223 08/15/2016      Chemistry      Component Value Date/Time   NA 142 08/15/2016 1119   K 3.2 (L) 08/15/2016 1119   CL 105 06/11/2016 1206   CL 105 01/18/2013 0912   CO2 26 08/15/2016 1119   BUN 15.7 08/15/2016 1119   CREATININE 0.8 08/15/2016 1119      Component Value Date/Time   CALCIUM 9.6 08/15/2016 1119   ALKPHOS 52 08/15/2016 1119   AST 17 08/15/2016 1119   ALT 10 08/15/2016 1119   BILITOT 0.86 08/15/2016 1119      ASSESSMENT & PLAN:  CLL (chronic lymphocytic leukemia) The patient has been consistent taking Ibrutinib since the last time I saw her. She tolerated treatment well without major side effects. Clinically, she is responding to treatment. Lymphadenopathy had resolved but she has persistent leukocytosis I will see her in 3 months for further review along with imaging study for objective assessment to treatment  Essential hypertension she will continue current medical management. I recommend close follow-up with primary care doctor for medication adjustment.  Atrial fibrillation ([place]) The patient was recently diagnosed with atrial fibrillation She is skeptical about starting on anticoagulation  therapy due to potential side effects of stroke I told her the reason for anticoagulation therapy is to prevent stroke and the risk of bleeding is estimated to be in the regional 3% per year I encouraged her to take the medication as prescribed  Hypokalemia She is taking potassium replacement therapy. Continue.   Orders Placed This Encounter  Procedures  . NM PET Image Restag (PS) Skull Base To Thigh    Standing Status:   Future    Standing Expiration Date:   09/19/2017    Order Specific Question:   Reason for exam:    Answer:   staging lymphoma assess response to Rx    Order Specific Question:   Preferred imaging location?    Answer:   [facility]  . CBC with Differential/Platelet    Standing Status:   Future    Standing Expiration Date:   09/19/2017  . Comprehensive metabolic panel    Standing Status:   Future    Standing Expiration Date:   09/19/2017  . Lactate dehydrogenase    Standing Status:   Future    Standing Expiration Date:   09/19/2017  All questions were answered. The patient knows to call the clinic with any problems, questions or concerns. No barriers to learning was detected. I spent 15 minutes counseling the patient face to face. The total time spent in the appointment was 20 minutes and more than 50% was on counseling and review of test results     Heath Lark, MD 08/15/2016 12:28 PM

## 2016-08-15 NOTE — Telephone Encounter (Signed)
Appointments scheduled per 08/15/16 los. AVS report and appointment schedule was given to patient, per 08/15/16 los. ° °

## 2016-08-15 NOTE — Assessment & Plan Note (Signed)
she will continue current medical management. I recommend close follow-up with primary care doctor for medication adjustment.  

## 2016-08-20 ENCOUNTER — Ambulatory Visit (INDEPENDENT_AMBULATORY_CARE_PROVIDER_SITE_OTHER): Payer: BLUE CROSS/BLUE SHIELD | Admitting: Internal Medicine

## 2016-08-20 ENCOUNTER — Encounter: Payer: Self-pay | Admitting: Internal Medicine

## 2016-08-20 VITALS — BP 120/80 | HR 60 | Temp 97.9°F | Resp 16 | Ht 67.0 in | Wt 196.0 lb

## 2016-08-20 DIAGNOSIS — I10 Essential (primary) hypertension: Secondary | ICD-10-CM

## 2016-08-20 DIAGNOSIS — E876 Hypokalemia: Secondary | ICD-10-CM | POA: Diagnosis not present

## 2016-08-20 MED ORDER — POTASSIUM CHLORIDE CRYS ER 20 MEQ PO TBCR: 20.0000 meq | EXTENDED_RELEASE_TABLET | Freq: Two times a day (BID) | ORAL | 1 refills | Status: DC

## 2016-08-20 NOTE — Patient Instructions (Signed)
Hypertension Hypertension, commonly called high blood pressure, is when the force of blood pumping through your arteries is too strong. Your arteries are the blood vessels that carry blood from your heart throughout your body. A blood pressure reading consists of a higher number over a lower number, such as 110/72. The higher number (systolic) is the pressure inside your arteries when your heart pumps. The lower number (diastolic) is the pressure inside your arteries when your heart relaxes. Ideally you want your blood pressure below 120/80. Hypertension forces your heart to work harder to pump blood. Your arteries may become narrow or stiff. Having untreated or uncontrolled hypertension can cause heart attack, stroke, kidney disease, and other problems. What increases the risk? Some risk factors for high blood pressure are controllable. Others are not. Risk factors you cannot control include:  Race. You may be at higher risk if you are African American.  Age. Risk increases with age.  Gender. Men are at higher risk than women before age 45 years. After age 65, women are at higher risk than men. Risk factors you can control include:  Not getting enough exercise or physical activity.  Being overweight.  Getting too much fat, sugar, calories, or salt in your diet.  Drinking too much alcohol. What are the signs or symptoms? Hypertension does not usually cause signs or symptoms. Extremely high blood pressure (hypertensive crisis) may cause headache, anxiety, shortness of breath, and nosebleed. How is this diagnosed? To check if you have hypertension, your health care provider will measure your blood pressure while you are seated, with your arm held at the level of your heart. It should be measured at least twice using the same arm. Certain conditions can cause a difference in blood pressure between your right and left arms. A blood pressure reading that is higher than normal on one occasion does  not mean that you need treatment. If it is not clear whether you have high blood pressure, you may be asked to return on a different day to have your blood pressure checked again. Or, you may be asked to monitor your blood pressure at home for 1 or more weeks. How is this treated? Treating high blood pressure includes making lifestyle changes and possibly taking medicine. Living a healthy lifestyle can help lower high blood pressure. You may need to change some of your habits. Lifestyle changes may include:  Following the DASH diet. This diet is high in fruits, vegetables, and whole grains. It is low in salt, red meat, and added sugars.  Keep your sodium intake below 2,300 mg per day.  Getting at least 30-45 minutes of aerobic exercise at least 4 times per week.  Losing weight if necessary.  Not smoking.  Limiting alcoholic beverages.  Learning ways to reduce stress. Your health care provider may prescribe medicine if lifestyle changes are not enough to get your blood pressure under control, and if one of the following is true:  You are 18-59 years of age and your systolic blood pressure is above 140.  You are 60 years of age or older, and your systolic blood pressure is above 150.  Your diastolic blood pressure is above 90.  You have diabetes, and your systolic blood pressure is over 140 or your diastolic blood pressure is over 90.  You have kidney disease and your blood pressure is above 140/90.  You have heart disease and your blood pressure is above 140/90. Your personal target blood pressure may vary depending on your medical   conditions, your age, and other factors. Follow these instructions at home:  Have your blood pressure rechecked as directed by your health care provider.  Take medicines only as directed by your health care provider. Follow the directions carefully. Blood pressure medicines must be taken as prescribed. The medicine does not work as well when you skip  doses. Skipping doses also puts you at risk for problems.  Do not smoke.  Monitor your blood pressure at home as directed by your health care provider. Contact a health care provider if:  You think you are having a reaction to medicines taken.  You have recurrent headaches or feel dizzy.  You have swelling in your ankles.  You have trouble with your vision. Get help right away if:  You develop a severe headache or confusion.  You have unusual weakness, numbness, or feel faint.  You have severe chest or abdominal pain.  You vomit repeatedly.  You have trouble breathing. This information is not intended to replace advice given to you by your health care provider. Make sure you discuss any questions you have with your health care provider. Document Released: 09/22/2005 Document Revised: 02/28/2016 Document Reviewed: 07/15/2013 Elsevier Interactive Patient Education  2017 [employer]  

## 2016-08-20 NOTE — Progress Notes (Signed)
Pre visit review using our clinic review tool, if applicable. No additional management support is needed unless otherwise documented below in the visit note. 

## 2016-08-20 NOTE — Progress Notes (Signed)
Subjective:  Patient ID: Audrey Gross, female    DOB: 01-13-1961  Age: 55 y.o. MRN: [id number]  CC: Hypertension   HPI KORRYN MONTANARI presents for a blood pressure check. She feels well and offers no complaints. She has been compliant with her medications and tells me at home her blood pressures been well controlled. She denies any recent episodes of palpitations, chest pain, shortness of breath, headache, or blurred vision.  Outpatient Medications Prior to Visit  Medication Sig Dispense Refill  . apixaban (ELIQUIS) 5 MG TABS tablet Take 1 tablet (5 mg total) by mouth 2 (two) times daily. 60 tablet 11  . carvedilol (COREG) 6.25 MG tablet Take 1 tablet (6.25 mg total) by mouth 2 (two) times daily. 180 tablet 3  . IMBRUVICA 140 MG capsul TAKE 3 CAPSULES BY MOUTH DAILY 90 capsule 3  . medroxyPROGESTERone (PROVERA) 5 MG tablet     . telmisartan-hydrochlorothiazide (MICARDIS HCT) 80-25 MG tablet Take 1 tablet by mouth daily. 90 tablet 1  . Vitamin D, Ergocalciferol, (DRISDOL) 50000 units CAPS capsule Take 1 capsule (50,000 Units total) by mouth every 7 (seven) days. 12 capsule 0  . potassium chloride SA (K-DUR,KLOR-CON) 20 MEQ tablet Take 1 tablet (20 mEq total) by mouth 2 (two) times daily. 60 tablet 5   No facility-administered medications prior to visit.     ROS Review of Systems  Constitutional: Negative for appetite change, diaphoresis, fatigue and unexpected weight change.  HENT: Negative.   Eyes: Negative for visual disturbance.  Respiratory: Negative for cough, choking, chest tightness, shortness of breath and stridor.   Cardiovascular: Negative.  Negative for chest pain, palpitations and leg swelling.  Gastrointestinal: Negative.  Negative for abdominal pain.  Endocrine: Negative.   Genitourinary: Negative.  Negative for difficulty urinating.  Musculoskeletal: Negative.  Negative for back pain, myalgias and neck pain.  Skin: Negative.  Negative for color change and rash.    Allergic/Immunologic: Negative.   Neurological: Negative.  Negative for dizziness, weakness, numbness and headaches.  Hematological: Negative.  Negative for adenopathy. Does not bruise/bleed easily.  Psychiatric/Behavioral: Negative.     Objective:  BP 120/80 (BP Location: Left Arm, Patient Position: Sitting, Cuff Size: Normal)   Pulse 60   Temp 97.9 F (36.6 C) (Oral)   Resp 16   Ht 5\' 7"  (1.702 m)   Wt 196 lb (88.9 kg)   SpO2 98%   BMI 30.70 kg/m   BP Readings from Last 3 Encounters:  08/20/16 120/80  08/15/16 (!) 151/77  08/13/16 117/70    Wt Readings from Last 3 Encounters:  08/20/16 196 lb (88.9 kg)  08/15/16 196 lb 14.4 oz (89.3 kg)  08/13/16 195 lb 3.2 oz (88.5 kg)    Physical Exam  Constitutional: She is oriented to person, place, and time. She appears well-developed and well-nourished. No distress.  HENT:  Head: Normocephalic and atraumatic.  Mouth/Throat: Oropharynx is clear and moist. No oropharyngeal exudate.  Eyes: Conjunctivae are normal. Right eye exhibits no discharge. Left eye exhibits no discharge. No scleral icterus.  Neck: Neck supple. No JVD present. No tracheal deviation present. No thyromegaly present.  Cardiovascular: Normal rate, regular rhythm, normal heart sounds and intact distal pulses.  Exam reveals no gallop and no friction rub.   No murmur heard. Pulmonary/Chest: Effort normal and breath sounds normal. No stridor. No respiratory distress. She has no wheezes. She has no rales. She exhibits no tenderness.  Abdominal: Soft. Bowel sounds are normal. She exhibits no distension and  no mass. There is no tenderness. There is no rebound and no guarding.  Musculoskeletal: Normal range of motion. She exhibits no edema, tenderness or deformity.  Lymphadenopathy:    She has no cervical adenopathy.  Neurological: She is oriented to person, place, and time.  Skin: Skin is warm and dry. No rash noted. She is not diaphoretic. No erythema. No pallor.   Vitals reviewed.   Lab Results  Component Value Date   WBC 26.8 (H) 08/15/2016   HGB 11.1 (L) 08/15/2016   HCT 35.4 08/15/2016   PLT 223 08/15/2016   GLUCOSE 105 08/15/2016   CHOL 177 06/11/2016   TRIG 181.0 (H) 06/11/2016   HDL 44.50 06/11/2016   LDLDIRECT 144.5 07/26/2013   LDLCALC 96 06/11/2016   ALT 10 08/15/2016   AST 17 08/15/2016   NA 142 08/15/2016   K 3.2 (L) 08/15/2016   CL 105 06/11/2016   CREATININE 0.8 08/15/2016   BUN 15.7 08/15/2016   CO2 26 08/15/2016   TSH 0.68 04/07/2016    Mm Digital Screening Bilateral  Result Date: 05/06/2016 CLINICAL DATA:  Screening. EXAM: DIGITAL SCREENING BILATERAL MAMMOGRAM WITH CAD COMPARISON:  Previous exam(s). ACR Breast Density Category c: The breast tissue is heterogeneously dense, which may obscure small masses. FINDINGS: There are no findings suspicious for malignancy. Images were processed with CAD. IMPRESSION: No mammographic evidence of malignancy. A result letter of this screening mammogram will be mailed directly to the patient. RECOMMENDATION: Screening mammogram in one year. (Code:SM-B-01Y) BI-RADS CATEGORY  1: Negative. Electronically Signed   By: Ammie Ferrier M.D.   On: 05/06/2016 15:17    Assessment & Plan:   Marelly was seen today for hypertension.  Diagnoses and all orders for this visit:  Hypokalemia- her recent potassium level was below the normal range, I have asked her to be more compliant with potassium replacement therapy -     potassium chloride SA (K-DUR,KLOR-CON) 20 MEQ tablet; Take 1 tablet (20 mEq total) by mouth 2 (two) times daily.  Essential hypertension, benign- her blood pressure is adequately well controlled on the current regimen, I've asked her to be more diligent about the potassium replacement therapy. -     potassium chloride SA (K-DUR,KLOR-CON) 20 MEQ tablet; Take 1 tablet (20 mEq total) by mouth 2 (two) times daily.   I am having Ms. Cruickshank maintain her IMBRUVICA, carvedilol, Vitamin D  (Ergocalciferol), telmisartan-hydrochlorothiazide, apixaban, medroxyPROGESTERone, and potassium chloride SA.  Meds ordered this encounter  Medications  . potassium chloride SA (K-DUR,KLOR-CON) 20 MEQ tablet    Sig: Take 1 tablet (20 mEq total) by mouth 2 (two) times daily.    Dispense:  180 tablet    Refill:  1     Follow-up: Return in about 4 months (around 12/18/2016).  [name], MD

## 2016-09-04 ENCOUNTER — Other Ambulatory Visit: Payer: Self-pay | Admitting: Hematology and Oncology

## 2016-09-04 MED FILL — IMBRUVICA 140 MG CAPSULE: 140 | 30 days supply | Qty: 90 | Fill #0

## 2016-09-14 ENCOUNTER — Other Ambulatory Visit: Payer: Self-pay | Admitting: Obstetrics and Gynecology

## 2016-09-15 NOTE — Telephone Encounter (Signed)
Medication refill request: Medroxyprogesterone Last AEX:  07/10/16 JJ Next AEX: 07/13/17 JJ Last MMG (if hormonal medication request): 05/06/16 [facility] Refill authorized: 07/10/16 #15 1R. Please advise. Thank you.

## 2016-09-30 ENCOUNTER — Other Ambulatory Visit: Payer: Self-pay | Admitting: Obstetrics and Gynecology

## 2016-10-01 NOTE — Telephone Encounter (Signed)
Medication refill request: Vitamin D Last AEX:  07-10-16  Next AEX: 07-13-17  Last MMG (if hormonal medication request): 05-06-16 WNL  Refill authorized: please advise  Patient is scheduled 10-17-16 for vitamin D recheck   Sending to BS since Starr is out of the office

## 2016-10-07 MED FILL — IMBRUVICA 140 MG CAPSULE: 140 | 30 days supply | Qty: 90 | Fill #1

## 2016-10-17 ENCOUNTER — Other Ambulatory Visit (INDEPENDENT_AMBULATORY_CARE_PROVIDER_SITE_OTHER): Payer: BLUE CROSS/BLUE SHIELD

## 2016-10-17 ENCOUNTER — Other Ambulatory Visit: Payer: Self-pay | Admitting: *Deleted

## 2016-10-17 DIAGNOSIS — E559 Vitamin D deficiency, unspecified: Secondary | ICD-10-CM

## 2016-10-18 LAB — VITAMIN D 25 HYDROXY (VIT D DEFICIENCY, FRACTURES): VIT D 25 HYDROXY: 37 ng/mL (ref 30–100)

## 2016-10-20 ENCOUNTER — Encounter: Payer: Self-pay | Admitting: Internal Medicine

## 2016-10-21 ENCOUNTER — Telehealth: Payer: Self-pay | Admitting: Pharmacist

## 2016-10-21 NOTE — Telephone Encounter (Signed)
Oral Chemotherapy Pharmacist Encounter  Received notification from [facility] that patient's Imbruvica prescription would require prior authorization. Noted original restart date of Imbruvica as 10/10/15.  Imbruvica PA form completed with CLL diagnosis and faxed back [facility] at [facility] will continue to follow.  Johny Drilling, PharmD, BCPS, BCOP 10/21/2016  12:03 PM [facility] [phone]

## 2016-10-30 ENCOUNTER — Telehealth: Payer: Self-pay | Admitting: *Deleted

## 2016-10-30 NOTE — Telephone Encounter (Signed)
Manuela Schwartz from Radiology Scheduling called to see if nurse had any other phone numbers for pt other than what is listed in EMR.  Reviewed numbers w/ Manuela Schwartz and I do not see any other numbers.  She has not been able to reach pt to schedule her PET scan yet.  She will try to call again.

## 2016-11-06 ENCOUNTER — Other Ambulatory Visit: Payer: Self-pay | Admitting: Obstetrics and Gynecology

## 2016-11-06 MED FILL — IMBRUVICA 140 MG CAPSULE: 140 | 30 days supply | Qty: 90 | Fill #2

## 2016-11-06 NOTE — Telephone Encounter (Signed)
Medication refill request: Vitamin D Last AEX:  07/10/16 JJ Next AEX: 07/13/17 JJ Last MMG (if hormonal medication request): 05/06/16 BIRADS1:neg  Refill authorized: 08/01/16 #4/0R.   Notes Recorded by Salvadore Dom, MD on 10/21/2016 at 4:29 PM EST The following my chart message was sent: Hi Audrey Gross,  Your vit d levels are now in the normal range. You should start taking 1,000 IU a day of vit D 3. You will need to take this long term. Please call the office with any questions or concerns. [name]

## 2016-11-13 ENCOUNTER — Other Ambulatory Visit (HOSPITAL_BASED_OUTPATIENT_CLINIC_OR_DEPARTMENT_OTHER): Payer: BLUE CROSS/BLUE SHIELD

## 2016-11-13 ENCOUNTER — Encounter (HOSPITAL_COMMUNITY)
Admission: RE | Admit: 2016-11-13 | Discharge: 2016-11-13 | Disposition: A | Discharge status: Home / Self Care | Payer: BLUE CROSS/BLUE SHIELD | Source: Ambulatory Visit | Attending: Hematology and Oncology | Admitting: Hematology and Oncology

## 2016-11-13 DIAGNOSIS — C9110 Chronic lymphocytic leukemia of B-cell type not having achieved remission: Secondary | ICD-10-CM | POA: Diagnosis not present

## 2016-11-13 LAB — COMPREHENSIVE METABOLIC PANEL
ALT: 13 U/L (ref 0–55)
ANION GAP: 10 meq/L (ref 3–11)
AST: 14 U/L (ref 5–34)
Albumin: 4.1 g/dL (ref 3.5–5.0)
Alkaline Phosphatase: 51 U/L (ref 40–150)
BILIRUBIN TOTAL: 1.49 mg/dL — AB (ref 0.20–1.20)
BUN: 13.9 mg/dL (ref 7.0–26.0)
CHLORIDE: 105 meq/L (ref 98–109)
CO2: 24 meq/L (ref 22–29)
Calcium: 9.2 mg/dL (ref 8.4–10.4)
Creatinine: 0.8 mg/dL (ref 0.6–1.1)
GLUCOSE: 125 mg/dL (ref 70–140)
Potassium: 3.1 mEq/L — ABNORMAL LOW (ref 3.5–5.1)
SODIUM: 140 meq/L (ref 136–145)
TOTAL PROTEIN: 7.2 g/dL (ref 6.4–8.3)

## 2016-11-13 LAB — CBC WITH DIFFERENTIAL/PLATELET
BASO%: 0.2 % (ref 0.0–2.0)
BASOS ABS: 0.1 10*3/uL (ref 0.0–0.1)
EOS ABS: 0.1 10*3/uL (ref 0.0–0.5)
EOS%: 0.2 % (ref 0.0–7.0)
HCT: 36.5 % (ref 34.8–46.6)
HGB: 11.6 g/dL (ref 11.6–15.9)
LYMPH%: 88.3 % — AB (ref 14.0–49.7)
MCH: 26.7 pg (ref 25.1–34.0)
MCHC: 31.7 g/dL (ref 31.5–36.0)
MCV: 84.2 fL (ref 79.5–101.0)
MONO#: 0.4 10*3/uL (ref 0.1–0.9)
MONO%: 0.9 % (ref 0.0–14.0)
NEUT#: 4.1 10*3/uL (ref 1.5–6.5)
NEUT%: 10.4 % — AB (ref 38.4–76.8)
PLATELETS: 253 10*3/uL (ref 145–400)
RBC: 4.33 10*6/uL (ref 3.70–5.45)
RDW: 15.8 % — ABNORMAL HIGH (ref 11.2–14.5)
WBC: 39.4 10*3/uL — AB (ref 3.9–10.3)
lymph#: 34.8 10*3/uL — ABNORMAL HIGH (ref 0.9–3.3)

## 2016-11-13 LAB — GLUCOSE, CAPILLARY: Glucose-Capillary: 86 mg/dL (ref 65–99)

## 2016-11-13 LAB — TECHNOLOGIST REVIEW

## 2016-11-13 LAB — LACTATE DEHYDROGENASE: LDH: 227 U/L (ref 125–245)

## 2016-11-13 MED ORDER — FLUDEOXYGLUCOSE F - 18 (FDG) INJECTION
9.7000 | Freq: Once | INTRAVENOUS | Status: AC | PRN
  Administered 2016-11-13: 9.7 via INTRAVENOUS

## 2016-11-14 ENCOUNTER — Telehealth: Payer: Self-pay | Admitting: Hematology and Oncology

## 2016-11-14 ENCOUNTER — Encounter: Payer: Self-pay | Admitting: Hematology and Oncology

## 2016-11-14 ENCOUNTER — Ambulatory Visit (HOSPITAL_BASED_OUTPATIENT_CLINIC_OR_DEPARTMENT_OTHER): Payer: BLUE CROSS/BLUE SHIELD | Admitting: Hematology and Oncology

## 2016-11-14 VITALS — BP 128/80 | HR 92 | Temp 98.2°F | Resp 20 | Ht 67.0 in | Wt 194.2 lb

## 2016-11-14 DIAGNOSIS — E876 Hypokalemia: Secondary | ICD-10-CM | POA: Diagnosis not present

## 2016-11-14 DIAGNOSIS — Z7901 Long term (current) use of anticoagulants: Secondary | ICD-10-CM | POA: Diagnosis not present

## 2016-11-14 DIAGNOSIS — I4891 Unspecified atrial fibrillation: Secondary | ICD-10-CM

## 2016-11-14 DIAGNOSIS — C9110 Chronic lymphocytic leukemia of B-cell type not having achieved remission: Secondary | ICD-10-CM | POA: Diagnosis not present

## 2016-11-14 DIAGNOSIS — I480 Paroxysmal atrial fibrillation: Secondary | ICD-10-CM

## 2016-11-14 NOTE — Assessment & Plan Note (Signed)
Likely due to her antihypertensives She is taking potassium replacement therapy. Continue. I gave her patient education handout on potassium rich diet

## 2016-11-14 NOTE — Progress Notes (Signed)
[facility] OFFICE PROGRESS NOTE  Patient Care Team: Janith Lima, MD as PCP - General (Internal Medicine)  SUMMARY OF ONCOLOGIC HISTORY:   CLL (chronic lymphocytic leukemia) ([place])   04/05/2015 Pathology Results    Accession: [id number] flow cytometry confirmed CLL. FISH was positive for p53 mutation      04/24/2015 Imaging    Extensive lymphadenopathy throughout the neck, chest (axilla), abdomen and pelvis, as detailed above, compatible with the reported clinical history of lymphoma. 2. Mild splenomegaly.      05/03/2015 - 08/27/2015 Chemotherapy    She started on Ibrutinib, discontinued prematurely when her prescription ran out      10/10/2015 -  Chemotherapy    She was restarted back on Ibrutinib      11/13/2016 PET scan    Significant generalized reduction in size of numerous lymph nodes in the neck, chest, abdomen, and pelvis. Previously the activity of these nodes was low-level and in general a similar low-level activity is present today, significantly less than mediastinal blood pool activity, compatible with [place] 2. 2. Coronary atherosclerosis. Mild cardiomegaly. 3. Mildly prominent endometrium for age without accentuated metabolic activity in the endometrium. Consider pelvic sonography for further characterization.       INTERVAL HISTORY: Please see below for problem oriented charting. She returns with her husband to review test results She stated she is compliant taking Ibrutinib The patient denies any recent signs or symptoms of bleeding such as spontaneous epistaxis, hematuria or hematochezia. No new lymphadenopathy or infection No recent anorexia or weight loss  REVIEW OF SYSTEMS:   Constitutional: Denies fevers, chills or abnormal weight loss Eyes: Denies blurriness of vision Ears, nose, mouth, throat, and face: Denies mucositis or sore throat Respiratory: Denies cough, dyspnea or wheezes Cardiovascular: Denies palpitation, chest discomfort or  lower extremity swelling Gastrointestinal:  Denies nausea, heartburn or change in bowel habits Skin: Denies abnormal skin rashes Lymphatics: Denies new lymphadenopathy or easy bruising Neurological:Denies numbness, tingling or new weaknesses Behavioral/Psych: Mood is stable, no new changes  All other systems were reviewed with the patient and are negative.  I have reviewed the past medical history, past surgical history, social history and family history with the patient and they are unchanged from previous note.  ALLERGIES:  has No Known Allergies.  MEDICATIONS:  Current Outpatient Prescriptions  Medication Sig Dispense Refill  . carvedilol (COREG) 6.25 MG tablet Take 1 tablet (6.25 mg total) by mouth 2 (two) times daily. 180 tablet 3  . IMBRUVICA 140 MG capsul TAKE 3 CAPSULES BY MOUTH DAILY 90 capsule 3  . medroxyPROGESTERone (PROVERA) 5 MG tablet TAKE 1 TABLET BY MOUTH DAILY FOR 5 DAYS EVERY OTHER MONTH IF NO SPONTANEOUS MENSES 15 tablet 1  . potassium chloride SA (K-DUR,KLOR-CON) 20 MEQ tablet Take 1 tablet (20 mEq total) by mouth 2 (two) times daily. 180 tablet 1  . telmisartan-hydrochlorothiazide (MICARDIS HCT) 80-25 MG tablet Take 1 tablet by mouth daily. 90 tablet 1  . Vitamin D, Cholecalciferol, 1000 units CAPS Take by mouth.    Marland Kitchen apixaban (ELIQUIS) 5 MG TABS tablet Take 1 tablet (5 mg total) by mouth 2 (two) times daily. (Patient not taking: Reported on 11/14/2016) 60 tablet 11   No current facility-administered medications for this visit.     PHYSICAL EXAMINATION: ECOG PERFORMANCE STATUS: 1 - Symptomatic but completely ambulatory  Vitals:   11/14/16 1135  BP: 128/80  Pulse: 92  Resp: 20  Temp: 98.2 F (36.8 C)   Filed Weights  11/14/16 1135  Weight: 194 lb 3.2 oz (88.1 kg)    GENERAL:alert, no distress and comfortable SKIN: skin color, texture, turgor are normal, no rashes or significant lesions EYES: normal, Conjunctiva are pink and non-injected, sclera  clear OROPHARYNX:no exudate, no erythema and lips, buccal mucosa, and tongue normal  NECK: supple, thyroid normal size, non-tender, without nodularity LYMPH:  no palpable lymphadenopathy in the cervical, axillary or inguinal LUNGS: clear to auscultation and percussion with normal breathing effort HEART: regular rate & rhythm and no murmurs and no lower extremity edema ABDOMEN:abdomen soft, non-tender and normal bowel sounds Musculoskeletal:no cyanosis of digits and no clubbing  NEURO: alert & oriented x 3 with fluent speech, no focal motor/sensory deficits  LABORATORY DATA:  I have reviewed the data as listed    Component Value Date/Time   NA 140 11/13/2016 1230   K 3.1 (L) 11/13/2016 1230   CL 105 06/11/2016 1206   CL 105 01/18/2013 0912   CO2 24 11/13/2016 1230   GLUCOSE 125 11/13/2016 1230   GLUCOSE 83 01/18/2013 0912   BUN 13.9 11/13/2016 1230   CREATININE 0.8 11/13/2016 1230   CALCIUM 9.2 11/13/2016 1230   PROT 7.2 11/13/2016 1230   ALBUMIN 4.1 11/13/2016 1230   AST 14 11/13/2016 1230   ALT 13 11/13/2016 1230   ALKPHOS 51 11/13/2016 1230   BILITOT 1.49 (H) 11/13/2016 1230    No results found for: SPEP, UPEP  Lab Results  Component Value Date   WBC 39.4 (H) 11/13/2016   NEUTROABS 4.1 11/13/2016   HGB 11.6 11/13/2016   HCT 36.5 11/13/2016   MCV 84.2 11/13/2016   PLT 253 11/13/2016      Chemistry      Component Value Date/Time   NA 140 11/13/2016 1230   K 3.1 (L) 11/13/2016 1230   CL 105 06/11/2016 1206   CL 105 01/18/2013 0912   CO2 24 11/13/2016 1230   BUN 13.9 11/13/2016 1230   CREATININE 0.8 11/13/2016 1230      Component Value Date/Time   CALCIUM 9.2 11/13/2016 1230   ALKPHOS 51 11/13/2016 1230   AST 14 11/13/2016 1230   ALT 13 11/13/2016 1230   BILITOT 1.49 (H) 11/13/2016 1230       RADIOGRAPHIC STUDIES: I have personally reviewed the radiological images as listed and agreed with the findings in the report. Nm Pet Image Restag (ps) Skull Base  To Thigh  Result Date: 11/13/2016 CLINICAL DATA:  Subsequent treatment strategy for chronic lymphocytic leukemia. EXAM: NUCLEAR MEDICINE PET SKULL BASE TO THIGH TECHNIQUE: 9.7 mCi F-18 FDG was injected intravenously. Full-ring PET imaging was performed from the skull base to thigh after the radiotracer. CT data was obtained and used for attenuation correction and anatomic localization. FASTING BLOOD GLUCOSE:  Value: 86 mg/dl COMPARISON:  04/24/2015 FINDINGS: NECK Numerous small bilateral level IIa and IIb lymph nodes as well as level 3 lymph nodes are present and are significantly smaller compared to prior exam. An index level IIb lymph node previously measuring 1.6 cm in short axis currently measures 0.7 cm in short axis this node previously had a maximum SUV of 2.8, currently the same a 2.8. Prior supraclavicular nodes have essentially resolved. Glottic activity is bilaterally symmetric and likely physiologic. CHEST Multiple bilateral axillary lymph nodes are present along with right paratracheal lymph nodes. These appear reduced in size in prominence to the prior exam. An index right axillary lymph node currently has short axis diameter of 1.0 cm on image 45/4 with  maximum SUV 2.3 (previously measuring 1.6 cm in short axis with a maximum SUV 3.0). The somewhat low-density right paratracheal node measures 1.3 cm in short axis (stable) and with very low metabolic activity similar to prior. Background mediastinal blood pool activity SUV 4.4. Coronary artery atherosclerosis noted with mild cardiomegaly. Lungs appear clear. ABDOMEN/PELVIS The mesenteric and retroperitoneal adenopathy as well as the pelvic adenopathy are significantly improved. Left periaortic node measures 0.6 cm in short axis on image 122/4, maximum SUV 2.2 (previous measurement 1.3 cm with maximum SUV 2.4). A left external iliac node short axis diameter 1.0 with maximum SUV 3.5 (formerly 1.5 cm with maximum SUV 2.7. Activity along the external  urethral orifice and vestibule probably attributable to mild incontinence. The spleen measures 10.0 by 4.1 by 12.3 cm (volume = 260 cm^3). No abnormal hypermetabolic activity identified in the solid organs. Mildly prominent uterus noted with prominent endometrium for age. SKELETON No focal hypermetabolic activity to suggest skeletal metastasis. Sclerosis along the iliac side of the left SI joint suggesting mild asymmetric sacroiliitis. IMPRESSION: 1. Significant generalized reduction in size of numerous lymph nodes in the neck, chest, abdomen, and pelvis. Previously the activity of these nodes was low-level and in general a similar low-level activity is present today, significantly less than mediastinal blood pool activity, compatible with Deau[place] 2. 2. Coronary atherosclerosis.  Mild cardiomegaly. 3. Mildly prominent endometrium for age without accentuated metabolic activity in the endometrium. Consider pelvic sonography for further characterization. Electronically Signed   By: Van Clines M.D.   On: 11/13/2016 12:40    ASSESSMENT & PLAN:  CLL (chronic lymphocytic leukemia) The patient has been consistent taking Ibrutinib since the last time I saw her. She tolerated treatment well without major side effects. Clinically, she is responding to treatment. PET scan and blood work showed residual disease We discussed potential option of switching her to [employer] treatment, given her young age She is hesitant about chemotherapy side-effects including risks of nausea and alopecia Ultimately she has elected to continue on Ibrutinib unless she has signs of progression I plan to see her in 3 months and repeat imaging again in 6 months  Hypokalemia Likely due to her antihypertensives She is taking potassium replacement therapy. Continue. I gave her patient education handout on potassium rich diet  Atrial fibrillation ([place]) The patient was recently diagnosed with atrial fibrillation She is doing well on  chronic anticoagulation therapy   No orders of the defined types were placed in this encounter.  All questions were answered. The patient knows to call the clinic with any problems, questions or concerns. No barriers to learning was detected. I spent 25 minutes counseling the patient face to face. The total time spent in the appointment was 30 minutes and more than 50% was on counseling and review of test results     Heath Lark, MD 11/14/2016 9:11 PM

## 2016-11-14 NOTE — Assessment & Plan Note (Signed)
The patient has been consistent taking Ibrutinib since the last time I saw her. She tolerated treatment well without major side effects. Clinically, she is responding to treatment. PET scan and blood work showed residual disease We discussed potential option of switching her to [employer] treatment, given her young age She is hesitant about chemotherapy side-effects including risks of nausea and alopecia Ultimately she has elected to continue on Ibrutinib unless she has signs of progression I plan to see her in 3 months and repeat imaging again in 6 months

## 2016-11-14 NOTE — Assessment & Plan Note (Signed)
The patient was recently diagnosed with atrial fibrillation She is doing well on chronic anticoagulation therapy 

## 2016-11-14 NOTE — Telephone Encounter (Signed)
Appointments scheduled per 2/9 LOS. Patient given AVS report and calendars with future scheduled appointments.  °

## 2016-11-26 NOTE — Telephone Encounter (Signed)
Oral Chemotherapy Pharmacist Encounter  Prior authorization for Imbruvica approved by [facility] Effective dates: 10/21/16-10/21/17  Prescription must [facility] filled [facility] per insurance requirement  Johny Drilling, PharmD, BCPS, BCOP 11/26/2016  8:04 AM [facility] [phone]

## 2016-12-09 MED FILL — IMBRUVICA 140 MG CAPSULE: 140 | 30 days supply | Qty: 90 | Fill #3

## 2016-12-10 ENCOUNTER — Telehealth: Payer: Self-pay | Admitting: Pharmacist

## 2016-12-10 NOTE — Telephone Encounter (Incomplete)
Received fax notification from [facility] outpatient pharmacy that Imbruvica prescription needs PA. Patient has been on this therapy consistently since January 2017.  Submitted PA through covermymeds: Key:  [id number]   Will continue to follow.  Raul Del, PharmD, BCPS, [facility] [phone]

## 2016-12-12 NOTE — Telephone Encounter (Signed)
Oral Chemotherapy Pharmacist Encounter  Received notification from [facility] that PA is not required as it is already approved 10/21/16-10/21/17 (see telephone note by Johny Drilling from 10/21/16) I called [facility] to follow-up on prescription status. Patient picked up prescription on 12/10/16 for copayment $10  No further needs from [facility] identified at this time. [facility]cology [facility] will sign off. Please let us know if we can be of assistance in the future.   Johny Drilling, PharmD, BCPS, BCOP 12/12/2016  4:24 PM [facility] [phone]

## 2016-12-18 ENCOUNTER — Ambulatory Visit (INDEPENDENT_AMBULATORY_CARE_PROVIDER_SITE_OTHER): Payer: BLUE CROSS/BLUE SHIELD | Admitting: Internal Medicine

## 2016-12-18 VITALS — BP 120/76 | HR 97 | Temp 98.4°F | Resp 16 | Ht 67.0 in | Wt 195.1 lb

## 2016-12-18 DIAGNOSIS — I480 Paroxysmal atrial fibrillation: Secondary | ICD-10-CM

## 2016-12-18 DIAGNOSIS — E876 Hypokalemia: Secondary | ICD-10-CM

## 2016-12-18 DIAGNOSIS — I10 Essential (primary) hypertension: Secondary | ICD-10-CM

## 2016-12-18 MED ORDER — SPIRONOLACTONE 25 MG PO TABS: 25.0000 mg | ORAL_TABLET | Freq: Every day | ORAL | 1 refills | Status: DC

## 2016-12-18 MED ORDER — TELMISARTAN 40 MG PO TABS: 40.0000 mg | ORAL_TABLET | Freq: Every day | ORAL | 1 refills | Status: DC

## 2016-12-18 NOTE — Patient Instructions (Signed)

## 2016-12-18 NOTE — Progress Notes (Signed)
Pre visit review using our clinic review tool, if applicable. No additional management support is needed unless otherwise documented below in the visit note. 

## 2016-12-18 NOTE — Progress Notes (Signed)
Subjective:  Patient ID: Audrey Gross, female    DOB: June 07, 1961  Age: 57 y.o. MRN: [id number]  CC: Hypertension   HPI Audrey Gross presents for Follow-up on hypertension. Audrey Gross feels well and offers no complaints but continues to struggle with hypokalemia. Audrey Gross's had no recent episodes of palpitations, headache, blurred vision, chest pain, shortness of breath, edema, or fatigue.  Outpatient Medications Prior to Visit  Medication Sig Dispense Refill  . apixaban (ELIQUIS) 5 MG TABS tablet Take 1 tablet (5 mg total) by mouth 2 (two) times daily. 60 tablet 11  . carvedilol (COREG) 6.25 MG tablet Take 1 tablet (6.25 mg total) by mouth 2 (two) times daily. 180 tablet 3  . IMBRUVICA 140 MG capsul TAKE 3 CAPSULES BY MOUTH DAILY 90 capsule 3  . medroxyPROGESTERone (PROVERA) 5 MG tablet TAKE 1 TABLET BY MOUTH DAILY FOR 5 DAYS EVERY OTHER MONTH IF NO SPONTANEOUS MENSES 15 tablet 1  . potassium chloride SA (K-DUR,KLOR-CON) 20 MEQ tablet Take 1 tablet (20 mEq total) by mouth 2 (two) times daily. 180 tablet 1  . Vitamin D, Cholecalciferol, 1000 units CAPS Take by mouth.    . telmisartan-hydrochlorothiazide (MICARDIS HCT) 80-25 MG tablet Take 1 tablet by mouth daily. 90 tablet 1   No facility-administered medications prior to visit.     ROS Review of Systems  Constitutional: Negative.  Negative for chills, diaphoresis, fatigue and unexpected weight change.  HENT: Negative.  Negative for facial swelling, sore throat and trouble swallowing.   Eyes: Negative.  Negative for visual disturbance.  Respiratory: Negative for cough, chest tightness, shortness of breath and wheezing.   Cardiovascular: Negative for chest pain, palpitations and leg swelling.  Gastrointestinal: Negative for abdominal pain, constipation, diarrhea, nausea and vomiting.  Endocrine: Negative.   Genitourinary: Negative.  Negative for difficulty urinating, dysuria and urgency.  Musculoskeletal: Negative.  Negative for back pain and  myalgias.  Skin: Negative.   Allergic/Immunologic: Negative.   Neurological: Negative.  Negative for dizziness, weakness and headaches.  Hematological: Negative.  Negative for adenopathy. Does not bruise/bleed easily.  Psychiatric/Behavioral: Negative.     Objective:  BP 120/76 (BP Location: Left Arm, Patient Position: Sitting, Cuff Size: Normal)   Pulse 97   Temp 98.4 F (36.9 C) (Oral)   Resp 16   Ht 5\' 7"  (1.702 m)   Wt 195 lb 1.9 oz (88.5 kg)   SpO2 97%   BMI 30.56 kg/m   BP Readings from Last 3 Encounters:  12/18/16 120/76  11/14/16 128/80  08/20/16 120/80    Wt Readings from Last 3 Encounters:  12/18/16 195 lb 1.9 oz (88.5 kg)  11/14/16 194 lb 3.2 oz (88.1 kg)  08/20/16 196 lb (88.9 kg)    Physical Exam  Constitutional: Audrey Gross is oriented to person, place, and time. No distress.  HENT:  Mouth/Throat: Oropharynx is clear and moist. No oropharyngeal exudate.  Eyes: Conjunctivae are normal. Right eye exhibits no discharge. Left eye exhibits no discharge. No scleral icterus.  Neck: Normal range of motion. Neck supple. No JVD present. No tracheal deviation present. No thyromegaly present.  Cardiovascular: Normal rate, regular rhythm, normal heart sounds and intact distal pulses.   Occasional extrasystoles are present. Exam reveals no gallop and no friction rub.   No murmur heard. Pulmonary/Chest: Effort normal and breath sounds normal. No stridor. No respiratory distress. Audrey Gross has no wheezes. Audrey Gross has no rales. Audrey Gross exhibits no tenderness.  Abdominal: Soft. Bowel sounds are normal. Audrey Gross exhibits no distension and no  mass. There is no tenderness. There is no rebound and no guarding.  Musculoskeletal: Normal range of motion. Audrey Gross exhibits no edema, tenderness or deformity.  Lymphadenopathy:    Audrey Gross has no cervical adenopathy.  Neurological: Audrey Gross is oriented to person, place, and time.  Skin: Skin is warm and dry. No rash noted. Audrey Gross is not diaphoretic. No erythema. No pallor.    Vitals reviewed.   Lab Results  Component Value Date   WBC 39.4 (H) 11/13/2016   HGB 11.6 11/13/2016   HCT 36.5 11/13/2016   PLT 253 11/13/2016   GLUCOSE 125 11/13/2016   CHOL 177 06/11/2016   TRIG 181.0 (H) 06/11/2016   HDL 44.50 06/11/2016   LDLDIRECT 144.5 07/26/2013   LDLCALC 96 06/11/2016   ALT 13 11/13/2016   AST 14 11/13/2016   NA 140 11/13/2016   K 3.1 (L) 11/13/2016   CL 105 06/11/2016   CREATININE 0.8 11/13/2016   BUN 13.9 11/13/2016   CO2 24 11/13/2016   TSH 0.68 04/07/2016    Nm Pet Image Restag (ps) Skull Base To Thigh  Result Date: 11/13/2016 CLINICAL DATA:  Subsequent treatment strategy for chronic lymphocytic leukemia. EXAM: NUCLEAR MEDICINE PET SKULL BASE TO THIGH TECHNIQUE: 9.7 mCi F-18 FDG was injected intravenously. Full-ring PET imaging was performed from the skull base to thigh after the radiotracer. CT data was obtained and used for attenuation correction and anatomic localization. FASTING BLOOD GLUCOSE:  Value: 86 mg/dl COMPARISON:  04/24/2015 FINDINGS: NECK Numerous small bilateral level IIa and IIb lymph nodes as well as level 3 lymph nodes are present and are significantly smaller compared to prior exam. An index level IIb lymph node previously measuring 1.6 cm in short axis currently measures 0.7 cm in short axis this node previously had a maximum SUV of 2.8, currently the same a 2.8. Prior supraclavicular nodes have essentially resolved. Glottic activity is bilaterally symmetric and likely physiologic. CHEST Multiple bilateral axillary lymph nodes are present along with right paratracheal lymph nodes. These appear reduced in size in prominence to the prior exam. An index right axillary lymph node currently has short axis diameter of 1.0 cm on image 45/4 with maximum SUV 2.3 (previously measuring 1.6 cm in short axis with a maximum SUV 3.0). The somewhat low-density right paratracheal node measures 1.3 cm in short axis (stable) and with very low metabolic  activity similar to prior. Background mediastinal blood pool activity SUV 4.4. Coronary artery atherosclerosis noted with mild cardiomegaly. Lungs appear clear. ABDOMEN/PELVIS The mesenteric and retroperitoneal adenopathy as well as the pelvic adenopathy are significantly improved. Left periaortic node measures 0.6 cm in short axis on image 122/4, maximum SUV 2.2 (previous measurement 1.3 cm with maximum SUV 2.4). A left external iliac node short axis diameter 1.0 with maximum SUV 3.5 (formerly 1.5 cm with maximum SUV 2.7. Activity along the external urethral orifice and vestibule probably attributable to mild incontinence. The spleen measures 10.0 by 4.1 by 12.3 cm (volume = 260 cm^3). No abnormal hypermetabolic activity identified in the solid organs. Mildly prominent uterus noted with prominent endometrium for age. SKELETON No focal hypermetabolic activity to suggest skeletal metastasis. Sclerosis along the iliac side of the left SI joint suggesting mild asymmetric sacroiliitis. IMPRESSION: 1. Significant generalized reduction in size of numerous lymph nodes in the neck, chest, abdomen, and pelvis. Previously the activity of these nodes was low-level and in general a similar low-level activity is present today, significantly less than mediastinal blood pool activity, compatible with Deauville 2. 2. Coronary  atherosclerosis.  Mild cardiomegaly. 3. Mildly prominent endometrium for age without accentuated metabolic activity in the endometrium. Consider pelvic sonography for further characterization. Electronically Signed   By: Van Clines M.D.   On: 11/13/2016 12:40    Assessment & Plan:   Alvaretta was seen today for hypertension.  Diagnoses and all orders for this visit:  Hypokalemia- I've asked her to stop taking hydrochlorothiazide and switch to a diuretic that will avoid potassium wasting. -     spironolactone (ALDACTONE) 25 MG tablet; Take 1 tablet (25 mg total) by mouth daily.  Essential  hypertension- her blood pressure is well-controlled however Audrey Gross continues to struggle with hypokalemia so I've asked her to change to a potassium sparing diuretic and will continue the ARB. -     spironolactone (ALDACTONE) 25 MG tablet; Take 1 tablet (25 mg total) by mouth daily. -     telmisartan (MICARDIS) 40 MG tablet; Take 1 tablet (40 mg total) by mouth daily.   I have discontinued Ms. Mika's telmisartan-hydrochlorothiazide. I am also having her start on spironolactone and telmisartan. Additionally, I am having her maintain her IMBRUVICA, carvedilol, apixaban, potassium chloride SA, medroxyPROGESTERone, and Vitamin D (Cholecalciferol).  Meds ordered this encounter  Medications  . spironolactone (ALDACTONE) 25 MG tablet    Sig: Take 1 tablet (25 mg total) by mouth daily.    Dispense:  90 tablet    Refill:  1  . telmisartan (MICARDIS) 40 MG tablet    Sig: Take 1 tablet (40 mg total) by mouth daily.    Dispense:  90 tablet    Refill:  1     Follow-up: No Follow-up on file.  [name], MD

## 2016-12-20 ENCOUNTER — Encounter: Payer: Self-pay | Admitting: Internal Medicine

## 2016-12-20 NOTE — Assessment & Plan Note (Signed)
She has good rate and rhythm control, will continue anticoagulation with Eliquis

## 2017-01-07 MED FILL — IMBRUVICA 140 MG CAPSULE: 140 | 30 days supply | Qty: 90 | Fill #2

## 2017-02-03 DIAGNOSIS — I340 Nonrheumatic mitral (valve) insufficiency: Secondary | ICD-10-CM

## 2017-02-03 HISTORY — DX: Nonrheumatic mitral (valve) insufficiency: I34.0

## 2017-02-05 MED FILL — IMBRUVICA 140 MG CAPSULE: 140 | 30 days supply | Qty: 90 | Fill #3

## 2017-02-19 ENCOUNTER — Encounter: Payer: Self-pay | Admitting: Hematology and Oncology

## 2017-02-19 ENCOUNTER — Other Ambulatory Visit (HOSPITAL_BASED_OUTPATIENT_CLINIC_OR_DEPARTMENT_OTHER): Payer: BLUE CROSS/BLUE SHIELD

## 2017-02-19 ENCOUNTER — Ambulatory Visit (HOSPITAL_BASED_OUTPATIENT_CLINIC_OR_DEPARTMENT_OTHER): Payer: BLUE CROSS/BLUE SHIELD | Admitting: Hematology and Oncology

## 2017-02-19 ENCOUNTER — Telehealth: Payer: Self-pay | Admitting: Hematology and Oncology

## 2017-02-19 VITALS — BP 118/92 | HR 87 | Temp 97.5°F | Resp 18 | Ht 67.0 in | Wt 193.9 lb

## 2017-02-19 DIAGNOSIS — C9110 Chronic lymphocytic leukemia of B-cell type not having achieved remission: Secondary | ICD-10-CM

## 2017-02-19 DIAGNOSIS — C9190 Lymphoid leukemia, unspecified not having achieved remission: Secondary | ICD-10-CM

## 2017-02-19 DIAGNOSIS — I480 Paroxysmal atrial fibrillation: Secondary | ICD-10-CM | POA: Diagnosis not present

## 2017-02-19 DIAGNOSIS — K521 Toxic gastroenteritis and colitis: Secondary | ICD-10-CM | POA: Diagnosis not present

## 2017-02-19 LAB — COMPREHENSIVE METABOLIC PANEL
ALT: 13 U/L (ref 0–55)
AST: 11 U/L (ref 5–34)
Albumin: 3.8 g/dL (ref 3.5–5.0)
Alkaline Phosphatase: 40 U/L (ref 40–150)
Anion Gap: 7 mEq/L (ref 3–11)
BUN: 12.3 mg/dL (ref 7.0–26.0)
CALCIUM: 9.2 mg/dL (ref 8.4–10.4)
CHLORIDE: 111 meq/L — AB (ref 98–109)
CO2: 25 mEq/L (ref 22–29)
CREATININE: 0.8 mg/dL (ref 0.6–1.1)
EGFR: >90 mL/min/{1.73_m2} (ref >90)
Glucose: 75 mg/dl (ref 70–140)
Potassium: 4.2 mEq/L (ref 3.5–5.1)
SODIUM: 143 meq/L (ref 136–145)
Total Bilirubin: 1.51 mg/dL — ABNORMAL HIGH (ref 0.20–1.20)
Total Protein: 6.8 g/dL (ref 6.4–8.3)

## 2017-02-19 LAB — CBC WITH DIFFERENTIAL/PLATELET
BASO%: 0.6 % (ref 0.0–2.0)
Basophils Absolute: 0.2 10*3/uL — ABNORMAL HIGH (ref 0.0–0.1)
EOS%: 0.2 % (ref 0.0–7.0)
Eosinophils Absolute: 0.1 10*3/uL (ref 0.0–0.5)
HEMATOCRIT: 37.2 % (ref 34.8–46.6)
HEMOGLOBIN: 11.6 g/dL (ref 11.6–15.9)
LYMPH#: 27.6 10*3/uL — AB (ref 0.9–3.3)
LYMPH%: 87 % — ABNORMAL HIGH (ref 14.0–49.7)
MCH: 26.3 pg (ref 25.1–34.0)
MCHC: 31.2 g/dL — AB (ref 31.5–36.0)
MCV: 84.4 fL (ref 79.5–101.0)
MONO#: 1 10*3/uL — ABNORMAL HIGH (ref 0.1–0.9)
MONO%: 3 % (ref 0.0–14.0)
NEUT#: 2.9 10*3/uL (ref 1.5–6.5)
NEUT%: 9.2 % — AB (ref 38.4–76.8)
Platelets: 243 10*3/uL (ref 145–400)
RBC: 4.4 10*6/uL (ref 3.70–5.45)
RDW: 15 % — AB (ref 11.2–14.5)
WBC: 31.7 10*3/uL — ABNORMAL HIGH (ref 3.9–10.3)

## 2017-02-19 LAB — TECHNOLOGIST REVIEW

## 2017-02-19 NOTE — Progress Notes (Signed)
[facility] OFFICE PROGRESS NOTE  Patient Care Team: Janith Lima, MD as PCP - General (Internal Medicine)  SUMMARY OF ONCOLOGIC HISTORY:   CLL (chronic lymphocytic leukemia) ([place])   04/05/2015 Pathology Results    Accession: [id number] flow cytometry confirmed CLL. FISH was positive for p53 mutation      04/24/2015 Imaging    Extensive lymphadenopathy throughout the neck, chest (axilla), abdomen and pelvis, as detailed above, compatible with the reported clinical history of lymphoma. 2. Mild splenomegaly.      05/03/2015 - 08/27/2015 Chemotherapy    She started on Ibrutinib, discontinued prematurely when her prescription ran out      10/10/2015 -  Chemotherapy    She was restarted back on Ibrutinib      11/13/2016 PET scan    Significant generalized reduction in size of numerous lymph nodes in the neck, chest, abdomen, and pelvis. Previously the activity of these nodes was low-level and in general a similar low-level activity is present today, significantly less than mediastinal blood pool activity, compatible with Deauville 2. 2. Coronary atherosclerosis. Mild cardiomegaly. 3. Mildly prominent endometrium for age without accentuated metabolic activity in the endometrium. Consider pelvic sonography for further characterization.       INTERVAL HISTORY: Please see below for problem oriented charting. She returns for further follow-up She denies new lymphadenopathy She has noted more diarrhea recently The diarrhea is not severe She denies recent chest pain, palpitation or shortness of breath. The patient denies any recent signs or symptoms of bleeding such as spontaneous epistaxis, hematuria or hematochezia.  REVIEW OF SYSTEMS:   Constitutional: Denies fevers, chills or abnormal weight loss Eyes: Denies blurriness of vision Ears, nose, mouth, throat, and face: Denies mucositis or sore throat Respiratory: Denies cough, dyspnea or wheezes Cardiovascular: Denies  palpitation, chest discomfort or lower extremity swelling Skin: Denies abnormal skin rashes Lymphatics: Denies new lymphadenopathy or easy bruising Neurological:Denies numbness, tingling or new weaknesses Behavioral/Psych: Mood is stable, no new changes  All other systems were reviewed with the patient and are negative.  I have reviewed the past medical history, past surgical history, social history and family history with the patient and they are unchanged from previous note.  ALLERGIES:  has No Known Allergies.  MEDICATIONS:  Current Outpatient Prescriptions  Medication Sig Dispense Refill  . apixaban (ELIQUIS) 5 MG TABS tablet Take 1 tablet (5 mg total) by mouth 2 (two) times daily. 60 tablet 11  . carvedilol (COREG) 6.25 MG tablet Take 1 tablet (6.25 mg total) by mouth 2 (two) times daily. 180 tablet 3  . IMBRUVICA 140 MG capsul TAKE 3 CAPSULES BY MOUTH DAILY 90 capsule 3  . medroxyPROGESTERone (PROVERA) 5 MG tablet TAKE 1 TABLET BY MOUTH DAILY FOR 5 DAYS EVERY OTHER MONTH IF NO SPONTANEOUS MENSES 15 tablet 1  . potassium chloride SA (K-DUR,KLOR-CON) 20 MEQ tablet Take 1 tablet (20 mEq total) by mouth 2 (two) times daily. 180 tablet 1  . spironolactone (ALDACTONE) 25 MG tablet Take 1 tablet (25 mg total) by mouth daily. 90 tablet 1  . telmisartan (MICARDIS) 40 MG tablet Take 1 tablet (40 mg total) by mouth daily. 90 tablet 1  . Vitamin D, Cholecalciferol, 1000 units CAPS Take by mouth.     No current facility-administered medications for this visit.     PHYSICAL EXAMINATION: ECOG PERFORMANCE STATUS: 1 - Symptomatic but completely ambulatory  Vitals:   02/19/17 1258  BP: (!) 118/92  Pulse: 87  Resp: 18  Temp:  97.5 F (36.4 C)   Filed Weights   02/19/17 1258  Weight: 193 lb 14.4 oz (88 kg)    GENERAL:alert, no distress and comfortable SKIN: skin color, texture, turgor are normal, no rashes or significant lesions EYES: normal, Conjunctiva are pink and non-injected, sclera  clear OROPHARYNX:no exudate, no erythema and lips, buccal mucosa, and tongue normal  NECK: supple, thyroid normal size, non-tender, without nodularity LYMPH:  no palpable lymphadenopathy in the cervical, axillary or inguinal LUNGS: clear to auscultation and percussion with normal breathing effort HEART: regular rate & rhythm and no murmurs and no lower extremity edema ABDOMEN:abdomen soft, non-tender and normal bowel sounds Musculoskeletal:no cyanosis of digits and no clubbing  NEURO: alert & oriented x 3 with fluent speech, no focal motor/sensory deficits  LABORATORY DATA:  I have reviewed the data as listed    Component Value Date/Time   NA 143 02/19/2017 1222   K 4.2 02/19/2017 1222   CL 105 06/11/2016 1206   CL 105 01/18/2013 0912   CO2 25 02/19/2017 1222   GLUCOSE 75 02/19/2017 1222   GLUCOSE 83 01/18/2013 0912   BUN 12.3 02/19/2017 1222   CREATININE 0.8 02/19/2017 1222   CALCIUM 9.2 02/19/2017 1222   PROT 6.8 02/19/2017 1222   ALBUMIN 3.8 02/19/2017 1222   AST 11 02/19/2017 1222   ALT 13 02/19/2017 1222   ALKPHOS 40 02/19/2017 1222   BILITOT 1.51 (H) 02/19/2017 1222    No results found for: SPEP, UPEP  Lab Results  Component Value Date   WBC 31.7 (H) 02/19/2017   NEUTROABS 2.9 02/19/2017   HGB 11.6 02/19/2017   HCT 37.2 02/19/2017   MCV 84.4 02/19/2017   PLT 243 02/19/2017      Chemistry      Component Value Date/Time   NA 143 02/19/2017 1222   K 4.2 02/19/2017 1222   CL 105 06/11/2016 1206   CL 105 01/18/2013 0912   CO2 25 02/19/2017 1222   BUN 12.3 02/19/2017 1222   CREATININE 0.8 02/19/2017 1222      Component Value Date/Time   CALCIUM 9.2 02/19/2017 1222   ALKPHOS 40 02/19/2017 1222   AST 11 02/19/2017 1222   ALT 13 02/19/2017 1222   BILITOT 1.51 (H) 02/19/2017 1222       ASSESSMENT & PLAN:  CLL (chronic lymphocytic leukemia) The patient has been consistent taking Ibrutinib since the last time I saw her. She tolerated treatment well  without major side effects. Clinically, she is responding to treatment. PET scan and blood work showed residual disease We had discussed potential option of switching her to [employer] treatment, given her young age She is hesitant about chemotherapy side-effects including risks of nausea and alopecia Ultimately she has elected to continue on Ibrutinib unless she has signs of progression I plan to see her in 3 months and repeat imaging again  Atrial fibrillation [facility]) The patient was recently diagnosed with atrial fibrillation She is doing well on chronic anticoagulation therapy  Diarrhea due to drug She had recent diarrhea due to ibrutinib but manageable.  Continue conservative management.   Orders Placed This Encounter  Procedures  . NM PET Image Restag (PS) Skull Base To Thigh    Standing Status:   Future    Standing Expiration Date:   04/21/2018    Order Specific Question:   Reason for Exam (SYMPTOM  OR DIAGNOSIS REQUIRED)    Answer:   lymphoma staging    Order Specific Question:   Is the  patient pregnant?    Answer:   No    Order Specific Question:   Preferred imaging location?    Answer:   [facility]   All questions were answered. The patient knows to call the clinic with any problems, questions or concerns. No barriers to learning was detected. I spent 15 minutes counseling the patient face to face. The total time spent in the appointment was 20 minutes and more than 50% was on counseling and review of test results     Heath Lark, MD 02/19/2017 2:12 PM

## 2017-02-19 NOTE — Assessment & Plan Note (Signed)
The patient was recently diagnosed with atrial fibrillation She is doing well on chronic anticoagulation therapy

## 2017-02-19 NOTE — Assessment & Plan Note (Signed)
She had recent diarrhea due to ibrutinib but manageable.  Continue conservative management.

## 2017-02-19 NOTE — Assessment & Plan Note (Signed)
The patient has been consistent taking Ibrutinib since the last time I saw her. She tolerated treatment well without major side effects. Clinically, she is responding to treatment. PET scan and blood work showed residual disease We had discussed potential option of switching her to [employer] treatment, given her young age She is hesitant about chemotherapy side-effects including risks of nausea and alopecia Ultimately she has elected to continue on Ibrutinib unless she has signs of progression I plan to see her in 3 months and repeat imaging again

## 2017-02-19 NOTE — Telephone Encounter (Signed)
Gave patient AVS and calender per 5/17 los. Central radiology to contact patient with PET schedule.

## 2017-02-26 ENCOUNTER — Emergency Department (HOSPITAL_BASED_OUTPATIENT_CLINIC_OR_DEPARTMENT_OTHER): Payer: BLUE CROSS/BLUE SHIELD

## 2017-02-26 ENCOUNTER — Encounter (HOSPITAL_COMMUNITY): Payer: Self-pay | Admitting: Emergency Medicine

## 2017-02-26 ENCOUNTER — Observation Stay (HOSPITAL_COMMUNITY)
Admission: EM | Admit: 2017-02-26 | Discharge: 2017-02-28 | Disposition: A | Discharge status: Home / Self Care | Payer: BLUE CROSS/BLUE SHIELD | Attending: Cardiology | Admitting: Cardiology

## 2017-02-26 ENCOUNTER — Emergency Department (HOSPITAL_COMMUNITY): Payer: BLUE CROSS/BLUE SHIELD

## 2017-02-26 DIAGNOSIS — Z7901 Long term (current) use of anticoagulants: Secondary | ICD-10-CM | POA: Diagnosis not present

## 2017-02-26 DIAGNOSIS — I081 Rheumatic disorders of both mitral and tricuspid valves: Secondary | ICD-10-CM | POA: Diagnosis not present

## 2017-02-26 DIAGNOSIS — I480 Paroxysmal atrial fibrillation: Secondary | ICD-10-CM | POA: Insufficient documentation

## 2017-02-26 DIAGNOSIS — E785 Hyperlipidemia, unspecified: Secondary | ICD-10-CM | POA: Diagnosis present

## 2017-02-26 DIAGNOSIS — M8580 Other specified disorders of bone density and structure, unspecified site: Secondary | ICD-10-CM | POA: Insufficient documentation

## 2017-02-26 DIAGNOSIS — I10 Essential (primary) hypertension: Secondary | ICD-10-CM | POA: Diagnosis present

## 2017-02-26 DIAGNOSIS — Z9851 Tubal ligation status: Secondary | ICD-10-CM | POA: Diagnosis not present

## 2017-02-26 DIAGNOSIS — Z8249 Family history of ischemic heart disease and other diseases of the circulatory system: Secondary | ICD-10-CM | POA: Insufficient documentation

## 2017-02-26 DIAGNOSIS — Z800 Family history of malignant neoplasm of digestive organs: Secondary | ICD-10-CM | POA: Insufficient documentation

## 2017-02-26 DIAGNOSIS — I428 Other cardiomyopathies: Secondary | ICD-10-CM | POA: Diagnosis not present

## 2017-02-26 DIAGNOSIS — F329 Major depressive disorder, single episode, unspecified: Secondary | ICD-10-CM | POA: Diagnosis not present

## 2017-02-26 DIAGNOSIS — Z9114 Patient's other noncompliance with medication regimen: Secondary | ICD-10-CM | POA: Insufficient documentation

## 2017-02-26 DIAGNOSIS — I444 Left anterior fascicular block: Secondary | ICD-10-CM | POA: Insufficient documentation

## 2017-02-26 DIAGNOSIS — I5021 Acute systolic (congestive) heart failure: Secondary | ICD-10-CM

## 2017-02-26 DIAGNOSIS — Z8241 Family history of sudden cardiac death: Secondary | ICD-10-CM | POA: Diagnosis not present

## 2017-02-26 DIAGNOSIS — I420 Dilated cardiomyopathy: Secondary | ICD-10-CM | POA: Diagnosis not present

## 2017-02-26 DIAGNOSIS — R0602 Shortness of breath: Secondary | ICD-10-CM | POA: Diagnosis not present

## 2017-02-26 DIAGNOSIS — I160 Hypertensive urgency: Secondary | ICD-10-CM | POA: Diagnosis not present

## 2017-02-26 DIAGNOSIS — E876 Hypokalemia: Secondary | ICD-10-CM | POA: Insufficient documentation

## 2017-02-26 DIAGNOSIS — Z79899 Other long term (current) drug therapy: Secondary | ICD-10-CM | POA: Insufficient documentation

## 2017-02-26 DIAGNOSIS — I360 Nonrheumatic tricuspid (valve) stenosis: Secondary | ICD-10-CM | POA: Diagnosis not present

## 2017-02-26 DIAGNOSIS — I481 Persistent atrial fibrillation: Principal | ICD-10-CM | POA: Insufficient documentation

## 2017-02-26 DIAGNOSIS — C9110 Chronic lymphocytic leukemia of B-cell type not having achieved remission: Secondary | ICD-10-CM | POA: Diagnosis not present

## 2017-02-26 DIAGNOSIS — R000 Tachycardia, unspecified: Secondary | ICD-10-CM | POA: Insufficient documentation

## 2017-02-26 DIAGNOSIS — E781 Pure hyperglyceridemia: Secondary | ICD-10-CM | POA: Diagnosis not present

## 2017-02-26 DIAGNOSIS — I5043 Acute on chronic combined systolic (congestive) and diastolic (congestive) heart failure: Secondary | ICD-10-CM

## 2017-02-26 DIAGNOSIS — I5033 Acute on chronic diastolic (congestive) heart failure: Secondary | ICD-10-CM | POA: Insufficient documentation

## 2017-02-26 DIAGNOSIS — I340 Nonrheumatic mitral (valve) insufficiency: Secondary | ICD-10-CM

## 2017-02-26 DIAGNOSIS — I4891 Unspecified atrial fibrillation: Secondary | ICD-10-CM | POA: Insufficient documentation

## 2017-02-26 DIAGNOSIS — R011 Cardiac murmur, unspecified: Secondary | ICD-10-CM | POA: Insufficient documentation

## 2017-02-26 DIAGNOSIS — R61 Generalized hyperhidrosis: Secondary | ICD-10-CM | POA: Insufficient documentation

## 2017-02-26 DIAGNOSIS — I110 Hypertensive heart disease with heart failure: Secondary | ICD-10-CM | POA: Diagnosis not present

## 2017-02-26 HISTORY — DX: Unspecified atrial fibrillation: I48.91

## 2017-02-26 LAB — CBC
HCT: 38.8 % (ref 36.0–46.0)
HEMOGLOBIN: 12.2 g/dL (ref 12.0–15.0)
MCH: 26.7 pg (ref 26.0–34.0)
MCHC: 31.4 g/dL (ref 30.0–36.0)
MCV: 84.9 fL (ref 78.0–100.0)
Platelets: 275 10*3/uL (ref 150–400)
RBC: 4.57 MIL/uL (ref 3.87–5.11)
RDW: 15.4 % (ref 11.5–15.5)
WBC: 46.4 10*3/uL — AB (ref 4.0–10.5)

## 2017-02-26 LAB — BASIC METABOLIC PANEL
ANION GAP: 7 (ref 5–15)
BUN: 18 mg/dL (ref 6–20)
CALCIUM: 9.2 mg/dL (ref 8.9–10.3)
CHLORIDE: 110 mmol/L (ref 101–111)
CO2: 24 mmol/L (ref 22–32)
Creatinine, Ser: 0.95 mg/dL (ref 0.44–1.00)
GFR calc non Af Amer: >60 mL/min (ref >60)
Glucose, Bld: 64 mg/dL — ABNORMAL LOW (ref 65–99)
Potassium: 4.7 mmol/L (ref 3.5–5.1)
Sodium: 141 mmol/L (ref 135–145)

## 2017-02-26 LAB — ECHOCARDIOGRAM COMPLETE
HEIGHTINCHES: 67 in
WEIGHTICAEL: 3088 [oz_av]

## 2017-02-26 LAB — TROPONIN I: Troponin I: <0.03 ng/mL (ref <0.03)

## 2017-02-26 LAB — I-STAT TROPONIN, ED: TROPONIN I, POC: 0.01 ng/mL (ref 0.00–0.08)

## 2017-02-26 LAB — BRAIN NATRIURETIC PEPTIDE: B NATRIURETIC PEPTIDE 5: 207.9 pg/mL — AB (ref 0.0–100.0)

## 2017-02-26 MED ORDER — DILTIAZEM HCL 100 MG IV SOLR
5.0000 mg/h | INTRAVENOUS | Status: DC
  Administered 2017-02-26: 5 mg/h via INTRAVENOUS
  Filled 2017-02-26 (×2): qty 100

## 2017-02-26 MED ORDER — APIXABAN 5 MG PO TABS
5.0000 mg | ORAL_TABLET | Freq: Two times a day (BID) | ORAL | Status: DC
Start: 2017-02-26 — End: 2017-02-28
  Administered 2017-02-26 – 2017-02-28 (×4): 5 mg via ORAL
  Filled 2017-02-26 (×4): qty 1

## 2017-02-26 MED ORDER — VITAMIN D 1000 UNITS PO TABS
1000.0000 [IU] | ORAL_TABLET | ORAL | Status: DC
  Administered 2017-02-27 – 2017-02-28 (×2): 1000 [IU] via ORAL
  Filled 2017-02-26 (×2): qty 1

## 2017-02-26 MED ORDER — POTASSIUM CHLORIDE CRYS ER 20 MEQ PO TBCR
20.0000 meq | EXTENDED_RELEASE_TABLET | Freq: Two times a day (BID) | ORAL | Status: DC
  Administered 2017-02-26 – 2017-02-28 (×4): 20 meq via ORAL
  Filled 2017-02-26 (×4): qty 1

## 2017-02-26 MED ORDER — IBRUTINIB 140 MG PO TABS
420.0000 mg | ORAL_TABLET | Freq: Every day | ORAL | Status: DC
  Administered 2017-02-27 – 2017-02-28 (×2): 420 mg via ORAL
  Filled 2017-02-26 (×2): qty 3

## 2017-02-26 MED ORDER — ACETAMINOPHEN 325 MG PO TABS: 650.0000 mg | ORAL_TABLET | ORAL | Status: DC | PRN

## 2017-02-26 MED ORDER — CARVEDILOL 12.5 MG PO TABS
12.5000 mg | ORAL_TABLET | Freq: Two times a day (BID) | ORAL | Status: DC
  Administered 2017-02-26 – 2017-02-28 (×4): 12.5 mg via ORAL
  Filled 2017-02-26 (×4): qty 1

## 2017-02-26 MED ORDER — SPIRONOLACTONE 25 MG PO TABS
25.0000 mg | ORAL_TABLET | Freq: Every day | ORAL | Status: DC
  Administered 2017-02-26 – 2017-02-28 (×2): 25 mg via ORAL
  Filled 2017-02-26 (×3): qty 1

## 2017-02-26 MED ORDER — DILTIAZEM LOAD VIA INFUSION
20.0000 mg | Freq: Once | INTRAVENOUS | Status: AC
  Administered 2017-02-26: 20 mg via INTRAVENOUS
  Filled 2017-02-26 (×2): qty 20

## 2017-02-26 MED ORDER — IRBESARTAN 150 MG PO TABS
150.0000 mg | ORAL_TABLET | Freq: Every day | ORAL | Status: DC
  Filled 2017-02-26: qty 1

## 2017-02-26 MED ORDER — ONDANSETRON HCL 4 MG/2ML IJ SOLN
4.0000 mg | Freq: Four times a day (QID) | INTRAMUSCULAR | Status: DC | PRN
Start: 2017-02-26 — End: 2017-02-28

## 2017-02-26 MED ORDER — FUROSEMIDE 10 MG/ML IJ SOLN
40.0000 mg | Freq: Once | INTRAMUSCULAR | Status: AC
  Administered 2017-02-26: 40 mg via INTRAVENOUS
  Filled 2017-02-26: qty 4

## 2017-02-26 MED ORDER — FUROSEMIDE 10 MG/ML IJ SOLN
40.0000 mg | Freq: Two times a day (BID) | INTRAMUSCULAR | Status: DC
  Administered 2017-02-26: 40 mg via INTRAVENOUS
  Filled 2017-02-26 (×2): qty 4

## 2017-02-26 MED ORDER — DILTIAZEM HCL 100 MG IV SOLR
5.0000 mg/h | INTRAVENOUS | Status: DC
  Administered 2017-02-26: 5 mg/h via INTRAVENOUS

## 2017-02-26 MED ORDER — FUROSEMIDE 10 MG/ML IJ SOLN: 40.0000 mg | Freq: Two times a day (BID) | INTRAMUSCULAR | Status: DC

## 2017-02-26 NOTE — ED Provider Notes (Signed)
[facility] DEPT Provider Note   CSN: [id number] Arrival date & time: 02/26/17  7371     History   Chief Complaint Chief Complaint  Patient presents with  . Chest Pain  . Shortness of Breath    HPI Audrey Gross is a 56 y.o. female.  Patient is a 56 year old with a history of paroxysmal atrial fibrillation that was diagnosed last fall, CAL, hypertension and hyperlipidemia. She presents with shortness of breath and chest tightness that started this morning. She states that she hasn't felt well for the last several days but she can't really say exactly why. She does not feel like her heart is racing. She's felt intermittently short of breath over the last 2 weeks. She denies any leg swelling. She was seen by cardiology last October and started on Cardizem for paroxysmal atrial fibrillation that was found on an event monitor. Shortly after she started on Eliquis. She states that she's been taking Eliquis consistently and hasn't missed any doses.      Past Medical History:  Diagnosis Date  . Atrial fibrillation ([place])   . CLL (chronic lymphoblastic leukemia)    Leukemia  . Cystic breast   . Depression   . Diffuse cystic mastopathy   . Dyslipidemia (high LDL; low HDL)   . Hypertension   . Iron deficiency anemia, unspecified   . Lymphadenopathy of head and neck 04/05/2015  . Medical non-compliance   . Systolic ejection murmur     Patient Active Problem List   Diagnosis Date Noted  . Obesity 08/12/2016  . Hypertensive heart disease without heart failure 07/12/2016  . Hypertriglyceridemia without hypercholesterolemia 07/12/2016  . Screening for cervical cancer 06/11/2016  . Bradycardia 04/07/2016  . Atrial fibrillation ([place]) 01/01/2016  . Diarrhea due to drug 10/30/2015  . Hypokalemia 02/28/2014  . Abnormal mammogram of right breast 02/28/2014  . Dyslipidemia (high LDL; low HDL) 07/22/2012  . Heart murmur, systolic 03/31/9484  . Osteopenia 06/21/2012  . Routine general  medical examination at a health care facility 06/21/2012  . Essential hypertension 06/21/2012  . CLL (chronic lymphocytic leukemia) ([place]) 01/05/2012  . Iron deficiency anemia 01/05/2012  . Depression 01/05/2012  . 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 TUBAL LIGATION      OB History    Gravida Para Term Preterm AB Living   6 6 6     5    SAB TAB Ectopic Multiple Live Births           5       Home Medications    Prior to Admission medications   Medication Sig Start Date End Date Taking? Authorizing Provider  apixaban (ELIQUIS) 5 MG TABS tablet Take 1 tablet (5 mg total) by mouth 2 (two) times daily. 08/13/16  Yes Croitoru, Mihai, MD  carvedilol (COREG) 6.25 MG tablet Take 1 tablet (6.25 mg total) by mouth 2 (two) times daily. 07/11/16  Yes Croitoru, Mihai, MD  IMBRUVICA 140 MG capsul TAKE 3 CAPSULES BY MOUTH DAILY 06/27/16  Yes Gorsuch, Ni, MD  medroxyPROGESTERone (PROVERA) 5 MG tablet TAKE 1 TABLET BY MOUTH DAILY FOR 5 DAYS EVERY OTHER MONTH IF NO SPONTANEOUS MENSES 09/15/16  Yes Salvadore Dom, MD  potassium chloride SA (K-DUR,KLOR-CON) 20 MEQ tablet Take 1 tablet (20 mEq total) by mouth 2 (two) times daily. 08/20/16  Yes Janith Lima, MD  spironolactone (ALDACTONE) 25 MG tablet Take 1 tablet (  25 mg total) by mouth daily. 12/18/16  Yes Janith Lima, MD  telmisartan (MICARDIS) 40 MG tablet Take 1 tablet (40 mg total) by mouth daily. 12/18/16  Yes Janith Lima, MD  Vitamin D, Cholecalciferol, 1000 units CAPS Take 1,000 Units by mouth every morning.    Yes [provider]    Family History Family History  Problem Relation Age of Onset  . Sudden death Mother   . Kidney disease Father        H/O HD and kidney transplant  . Stomach cancer Maternal Aunt   . Stomach cancer Paternal Grandmother   . Heart disease Brother   . Heart disease Maternal Grandmother      Social History Social History  Substance Use Topics  . Smoking status: Never Smoker  . Smokeless tobacco: Never Used  . Alcohol use No     Allergies   Patient has no known allergies.   Review of Systems Review of Systems  Constitutional: Positive for fatigue. Negative for chills, diaphoresis and fever.  HENT: Negative for congestion, rhinorrhea and sneezing.   Eyes: Negative.   Respiratory: Positive for chest tightness and shortness of breath. Negative for cough.   Cardiovascular: Negative for chest pain and leg swelling.  Gastrointestinal: Negative for abdominal pain, blood in stool, diarrhea, nausea and vomiting.  Genitourinary: Negative for difficulty urinating, flank pain, frequency and hematuria.  Musculoskeletal: Negative for arthralgias and back pain.  Skin: Negative for rash.  Neurological: Negative for dizziness, speech difficulty, weakness, numbness and headaches.     Physical Exam Updated Vital Signs BP (!) 143/96   Pulse (!) 55   Temp 98.7 F (37.1 C) (Oral)   Resp (!) 27   Ht 5\' 7"  (1.702 m)   Wt 87.5 kg (193 lb)   LMP 12/27/2016 (Approximate)   SpO2 99%   BMI 30.23 kg/m   Physical Exam  Constitutional: She is oriented to person, place, and time. She appears well-developed and well-nourished.  HENT:  Head: Normocephalic and atraumatic.  Eyes: Pupils are equal, round, and reactive to light.  Neck: Normal range of motion. Neck supple.  Cardiovascular: Normal heart sounds.  An irregular rhythm present. Tachycardia present.   Pulmonary/Chest: Effort normal and breath sounds normal. No respiratory distress. She has no wheezes. She has no rales. She exhibits no tenderness.  Abdominal: Soft. Bowel sounds are normal. There is no tenderness. There is no rebound and no guarding.  Musculoskeletal: Normal range of motion. She exhibits no edema.  Lymphadenopathy:    She has no cervical adenopathy.  Neurological: She is alert and oriented to person, place,  and time.  Skin: Skin is warm and dry. No rash noted.  Psychiatric: She has a normal mood and affect.     ED Treatments / Results  Labs (all labs ordered are listed, but only abnormal results are displayed) Labs Reviewed  BASIC METABOLIC PANEL - Abnormal; Notable for the following:       Result Value   Glucose, Bld 64 (*)    All other components within normal limits  CBC - Abnormal; Notable for the following:    WBC 46.4 (*)    All other components within normal limits  BRAIN NATRIURETIC PEPTIDE - Abnormal; Notable for the following:    B Natriuretic Peptide 207.9 (*)    All other components within normal limits  Randolm Idol, ED    EKG  EKG Interpretation  Date/Time:  Thursday Feb 26 2017 08:28:40 EDT Ventricular Rate:  135 PR Interval:    QRS Duration: 86 QT Interval:  271 QTC Calculation: 407 R Axis:   -54 Text Interpretation:  Atrial fibrillation Left anterior fascicular block Abnormal R-wave progression, early transition LVH with secondary repolarization abnormality Baseline wander in lead(s) V5 V6 changed from prior EKG Confirmed by Malvin Johns [id number]) on 02/26/2017 8:56:10 AM       Radiology Dg Chest Portable 1 View  Result Date: 02/26/2017 CLINICAL DATA:  Shortness of breath EXAM: PORTABLE CHEST 1 VIEW COMPARISON:  None FINDINGS: Cardiomegaly and mild interstitial prominence. No other acute abnormalities. IMPRESSION: Findings are most consistent with cardiomegaly and mild edema. Electronically Signed   By: Dorise Bullion III M.D   On: 02/26/2017 09:02    Procedures Procedures (including critical care time)  Medications Ordered in ED Medications  diltiazem (CARDIZEM) 1 mg/mL load via infusion 20 mg (20 mg Intravenous Bolus from Bag 02/26/17 1024)    And  diltiazem (CARDIZEM) 100 mg in dextrose 5 % 100 mL (1 mg/mL) infusion (5 mg/hr Intravenous New Bag/Given 02/26/17 1024)  furosemide (LASIX) injection 40 mg (not administered)     Initial Impression  / Assessment and Plan / ED Course  I have reviewed the triage vital signs and the nursing notes.  Pertinent labs & imaging results that were available during my care of the patient were reviewed by me and considered in my medical decision making (see chart for details).     Patient presents with shortness of breath and chest tightness in association with A. fib with RVR. I did not feel that she was an appropriate candidate for cardioversion given that she seems to have some other issues going on. She does have evidence of some fluid overload. She reports shortness of breath on exertion for the last 2 weeks. She was given Cardizem and started on a Cardizem drip. Her rate is controlled with this. Her blood pressure is stable. I consulted cardiology who will see the patient.  Final Clinical Impressions(s) / ED Diagnoses   Final diagnoses:  Atrial fibrillation with rapid ventricular response (HCC)  Acute systolic congestive heart failure [facility])    New Prescriptions New Prescriptions   No medications on file     Malvin Johns, MD 02/26/17 1529

## 2017-02-26 NOTE — Consult Note (Signed)
Cardiology Consultation:   Patient ID: Audrey Gross; [id number]; November 07, 1960   Admit date: 02/26/2017 Date of Consult: 02/26/2017  Primary Care Provider: Janith Lima, MD Primary Cardiologist: Dr. Sallyanne Kuster Primary Electrophysiologist:  None   Patient Profile:   Audrey Gross is a 56 y.o. female with a hx of atrial fibrillation-on Eliquis, CLL on Imruvica, chronic diastolic HF on spironolactone, hypertension, hyperlipidemia who is being seen today for the evaluation of atrial fibrillation with RVR and fluid overload at the request of Dr. Tamera Punt.  History of Present Illness:   Audrey Gross had not been feeling well over the past two weeks. Endorsing a vague fatigue and mild SOB that she can best describe as "not feeling right". 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 She reports not sleeping well at night. She endorses being compliant with her medications. This morning she woke up feeling very poor, came to the ER and was found to be in atrial fibrillation with RVR rate 130's on EKG. A cardizem drip was started and she is now rate controlled between 60-80's. Chest xray shows cardiomegaly and mild edema, BNP 207.9, K 4.7, Na 141, Creatinine 0.95, trop 0.01,  WBC 46.4 (CLL), Hgb12.2  The patient now says that she did feel very poorly this morning but after her rate has been controlled she is feeling much better but still doesn't feel "right". Endorses mild SOB. Denies chest pain, back pain, headache, weakness, asymmetrical extremity swelling. + palpitations.     Past Medical History:  Diagnosis Date  . Atrial fibrillation ([place])   . CLL (chronic lymphoblastic leukemia)    Leukemia  . Cystic breast   . Depression   . Diffuse cystic mastopathy   . Dyslipidemia (high LDL; low HDL)   . Hypertension   . Iron deficiency anemia, unspecified   . Lymphadenopathy of head and neck 04/05/2015  . Medical non-compliance   . Systolic ejection murmur      Past Surgical History:  Procedure Laterality Date  . TUBAL LIGATION       Inpatient Medications: Scheduled Meds: . furosemide  40 mg Intravenous Once   Continuous Infusions: . diltiazem (CARDIZEM) infusion 5 mg/hr (02/26/17 1024)   PRN Meds:   Allergies:   No Known Allergies  Social History:   Social History   Social History  . Marital status: Legally Separated    Spouse name: N/A  . Number of children: 6  . 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 Smoking status: Never Smoker  . Smokeless tobacco: Never Used  . Alcohol use No  . Drug use: No  . Sexual activity: Yes    Partners: Male    Birth control/ protection: Surgical   Other Topics Concern  . Not on file   Social History Narrative  . No narrative on file    Family History:   The patient's family history includes Heart disease in her brother and maternal grandmother; Kidney disease in her father; Stomach cancer in her maternal aunt and paternal grandmother; Sudden death in her mother.  ROS:  Please see the history of present illness.  All other ROS reviewed and negative.     Physical Exam/Data:   Vitals:   02/26/17 1330 02/26/17 1345 02/26/17 1400 02/26/17 1445  BP: (!) 146/107 (!) 132/105 (!) 144/99 (!) 146/108  Pulse: (!) 45 (!) 56  76  Resp: Marland Kitchen)  22 (!) 23 20 (!) 26  Temp:      TempSrc:      SpO2: 99% 100% 98% 98%  Weight:      Height:       No intake or output data in the 24 hours ending 02/26/17 1502 Filed Weights   02/26/17 0833  Weight: 193 lb (87.5 kg)   Body mass index is 30.23 kg/m.  General: Well developed, well nourished, in no acute distress. Head: Normocephalic, atraumatic, sclera non-icteric, no xanthomas, nares are without discharge.  Neck: Negative for carotid bruits. JVD not elevated. Lungs: Clear bilaterally to auscultation without wheezes, rales, or rhonchi. Breathing is unlabored. Heart: RRR with S1 S2. No  murmurs, rubs, or gallops appreciated. Abdomen: Soft, non-tender, non-distended with normoactive bowel sounds. No hepatomegaly. No rebound/guarding. No obvious abdominal masses. Msk:  Strength and tone appear normal for age. Trace edema to bilateralle Extremities: No clubbing or cyanosis. No edema.  Distal pedal pulses are 2+ and equal bilaterally. Neuro: Alert and oriented X 3. No facial asymmetry. No focal deficit. Moves all extremities spontaneously. Psych:  Responds to questions appropriately with a normal affect.  EKG:  The EKG was personally reviewed and demonstrates a fib with RVR, HR 137  Relevant CV Studies:  Transthoracic echocardiogram 07/07/2016 ------------------------------------------------------------------- Study Conclusions  - Left ventricle: The cavity size was normal. There was moderate   concentric hypertrophy. Systolic function was normal. The   estimated ejection fraction was in the range of 55% to 60%. Wall   motion was normal; there were no regional wall motion   abnormalities. Left ventricular diastolic function parameters   were normal. - Aortic valve: Trileaflet; normal thickness leaflets.   Transvalvular velocity was within the normal range. There was no   stenosis. There was no regurgitation. - Aortic root: The aortic root was normal in size. - Ascending aorta: The ascending aorta was normal in size. - Mitral valve: Structurally normal valve. There was trivial   regurgitation. - Left atrium: The atrium was normal in size. - Right ventricle: The cavity size was normal. Wall thickness was   normal. Systolic function was normal. - Tricuspid valve: There was trivial regurgitation. - Pulmonary arteries: Systolic pressure was within the normal   range. - Inferior vena cava: The vessel was normal in size. - Pericardium, extracardiac: There was no pericardial effusion.  Laboratory Data:  Chemistry Recent Labs Lab 02/26/17 0838  NA 141  K 4.7  CL 110   CO2 24  GLUCOSE 64*  BUN 18  CREATININE 0.95  CALCIUM 9.2  GFRNONAA >60  GFRAA >60  ANIONGAP 7    Hematology Recent Labs Lab 02/26/17 0838  WBC 46.4*  RBC 4.57  HGB 12.2  HCT 38.8  MCV 84.9  MCH 26.7  MCHC 31.4  RDW 15.4  PLT 275   Cardiac EnzymesNo results for input(s): TROPONINI in the last 168 hours.  Recent Labs Lab 02/26/17 0855  TROPIPOC 0.01    BNP Recent Labs Lab 02/26/17 0838  BNP 207.9*    DDimer No results for input(s): DDIMER in the last 168 hours.  Radiology/Studies:  Dg Chest Portable 1 View  Result Date: 02/26/2017 CLINICAL DATA:  Shortness of breath EXAM: PORTABLE CHEST 1 VIEW COMPARISON:  None FINDINGS: Cardiomegaly and mild interstitial prominence. No other acute abnormalities. IMPRESSION: Findings are most consistent with cardiomegaly and mild edema. Electronically Signed   By: Dorise Bullion III M.D   On: 02/26/2017 09:02    Assessment and Plan:  1. Atrial fibrillation with RVR: Pt has history of a fib and is on Eliquis. She endorses being complaint with medication. It is unclear how long she has been in the abnormal rhythm but likely the RVR started recently. She is now rate controlled on IV Diltiazem. She takes Coreg  6.25 mg BID, I recommend increasing this to 12.5 mg BID BP is not well controlled in ED systolic ~ 216'K and diastolic 44-695.  2. Hypertension: Recommend increasing dose of Coreg to 12.5mg  as stated above.  3. Acute on chronic diastolic heart failure: normal EF October 2017, I will give he 40 mg IV lasix. Discussed this with Dr. Radford Pax, will order STAT echo to evaluate heart function.  Further disposition will based on how patient feels after IV lasix and STAT echo.  Kristopher Glee, PA-C  02/26/2017 3:02 PM

## 2017-02-26 NOTE — Progress Notes (Signed)
Pt brought her chemo pills, Imbruvica 140 mg from home.  Verified with pt there were 28 capsules in the bottle. Taken to the pharmacy to dispense.

## 2017-02-26 NOTE — ED Notes (Signed)
Family at bedside. 

## 2017-02-27 DIAGNOSIS — I081 Rheumatic disorders of both mitral and tricuspid valves: Secondary | ICD-10-CM | POA: Diagnosis not present

## 2017-02-27 DIAGNOSIS — E785 Hyperlipidemia, unspecified: Secondary | ICD-10-CM | POA: Diagnosis not present

## 2017-02-27 DIAGNOSIS — I481 Persistent atrial fibrillation: Secondary | ICD-10-CM | POA: Diagnosis not present

## 2017-02-27 DIAGNOSIS — I5043 Acute on chronic combined systolic (congestive) and diastolic (congestive) heart failure: Secondary | ICD-10-CM | POA: Diagnosis not present

## 2017-02-27 DIAGNOSIS — I110 Hypertensive heart disease with heart failure: Secondary | ICD-10-CM | POA: Diagnosis not present

## 2017-02-27 DIAGNOSIS — I10 Essential (primary) hypertension: Secondary | ICD-10-CM | POA: Diagnosis not present

## 2017-02-27 DIAGNOSIS — I4891 Unspecified atrial fibrillation: Secondary | ICD-10-CM | POA: Diagnosis not present

## 2017-02-27 DIAGNOSIS — C9110 Chronic lymphocytic leukemia of B-cell type not having achieved remission: Secondary | ICD-10-CM | POA: Diagnosis not present

## 2017-02-27 DIAGNOSIS — R011 Cardiac murmur, unspecified: Secondary | ICD-10-CM | POA: Diagnosis not present

## 2017-02-27 DIAGNOSIS — R000 Tachycardia, unspecified: Secondary | ICD-10-CM | POA: Diagnosis not present

## 2017-02-27 DIAGNOSIS — I420 Dilated cardiomyopathy: Secondary | ICD-10-CM | POA: Diagnosis not present

## 2017-02-27 DIAGNOSIS — I160 Hypertensive urgency: Secondary | ICD-10-CM | POA: Diagnosis not present

## 2017-02-27 DIAGNOSIS — E876 Hypokalemia: Secondary | ICD-10-CM | POA: Diagnosis not present

## 2017-02-27 DIAGNOSIS — E781 Pure hyperglyceridemia: Secondary | ICD-10-CM | POA: Diagnosis not present

## 2017-02-27 DIAGNOSIS — I480 Paroxysmal atrial fibrillation: Secondary | ICD-10-CM | POA: Diagnosis not present

## 2017-02-27 DIAGNOSIS — I428 Other cardiomyopathies: Secondary | ICD-10-CM | POA: Diagnosis not present

## 2017-02-27 DIAGNOSIS — Z79899 Other long term (current) drug therapy: Secondary | ICD-10-CM | POA: Diagnosis not present

## 2017-02-27 DIAGNOSIS — F329 Major depressive disorder, single episode, unspecified: Secondary | ICD-10-CM | POA: Diagnosis not present

## 2017-02-27 DIAGNOSIS — R61 Generalized hyperhidrosis: Secondary | ICD-10-CM | POA: Diagnosis not present

## 2017-02-27 LAB — COMPREHENSIVE METABOLIC PANEL
ALBUMIN: 4 g/dL (ref 3.5–5.0)
ALT: 59 U/L — ABNORMAL HIGH (ref 14–54)
ANION GAP: 10 (ref 5–15)
AST: 44 U/L — AB (ref 15–41)
Alkaline Phosphatase: 40 U/L (ref 38–126)
BUN: 21 mg/dL — AB (ref 6–20)
CHLORIDE: 103 mmol/L (ref 101–111)
CO2: 25 mmol/L (ref 22–32)
Calcium: 9.3 mg/dL (ref 8.9–10.3)
Creatinine, Ser: 1.06 mg/dL — ABNORMAL HIGH (ref 0.44–1.00)
GFR calc Af Amer: >60 mL/min (ref >60)
GFR calc non Af Amer: 58 mL/min — ABNORMAL LOW (ref >60)
GLUCOSE: 99 mg/dL (ref 65–99)
POTASSIUM: 4.5 mmol/L (ref 3.5–5.1)
SODIUM: 138 mmol/L (ref 135–145)
Total Bilirubin: 1.6 mg/dL — ABNORMAL HIGH (ref 0.3–1.2)
Total Protein: 6.6 g/dL (ref 6.5–8.1)

## 2017-02-27 LAB — LIPID PANEL
CHOL/HDL RATIO: 5.4 ratio
Cholesterol: 182 mg/dL (ref 0–200)
HDL: 34 mg/dL — ABNORMAL LOW (ref >40)
LDL Cholesterol: 121 mg/dL — ABNORMAL HIGH (ref 0–99)
Triglycerides: 135 mg/dL (ref <150)
VLDL: 27 mg/dL (ref 0–40)

## 2017-02-27 LAB — TROPONIN I

## 2017-02-27 MED ORDER — LOSARTAN POTASSIUM 50 MG PO TABS
50.0000 mg | ORAL_TABLET | Freq: Every day | ORAL | Status: DC
  Administered 2017-02-27 – 2017-02-28 (×2): 50 mg via ORAL
  Filled 2017-02-27 (×2): qty 1

## 2017-02-27 MED ORDER — SPIRONOLACTONE 25 MG PO TABS
12.5000 mg | ORAL_TABLET | Freq: Every day | ORAL | Status: DC
  Administered 2017-02-27 – 2017-02-28 (×2): 12.5 mg via ORAL
  Filled 2017-02-27: qty 1

## 2017-02-27 MED ORDER — FUROSEMIDE 40 MG PO TABS
40.0000 mg | ORAL_TABLET | Freq: Every day | ORAL | Status: DC
  Administered 2017-02-27 – 2017-02-28 (×2): 40 mg via ORAL
  Filled 2017-02-27: qty 1

## 2017-02-27 NOTE — Progress Notes (Signed)
Progress Note  Patient Name: Audrey Gross Date of Encounter: 02/27/2017  Primary Cardiologist: Dr. Sallyanne Kuster  Subjective   Feels better this am  Inpatient Medications    Scheduled Meds: . apixaban  5 mg Oral BID  . carvedilol  12.5 mg Oral BID WC  . cholecalciferol  1,000 Units Oral [id number]  . furosemide  40 mg Intravenous BID  . ibrutinib  420 mg Oral Daily  . irbesartan  150 mg Oral Daily  . potassium chloride SA  20 mEq Oral BID  . spironolactone  25 mg Oral Daily   Continuous Infusions:  PRN Meds: acetaminophen, ondansetron (ZOFRAN) IV   Vital Signs    Vitals:   02/26/17 1753 02/26/17 1800 02/26/17 2003 02/27/17 0519  BP: 135/84 (!) 135/105 105/70 117/75  Pulse: 72 94 72 66  Resp:   18 18  Temp:   98.2 F (36.8 C) 98 F (36.7 C)  TempSrc:   Oral Oral  SpO2:   100% 97%  Weight:    187 lb 4.8 oz (85 kg)  Height:        Intake/Output Summary (Last 24 hours) at 02/27/17 0856 Last data filed at 02/27/17 0520  Gross per 24 hour  Intake            51.25 ml  Output             2800 ml  Net         -2748.75 ml   Filed Weights   02/26/17 0833 02/26/17 1740 02/27/17 0519  Weight: 193 lb (87.5 kg) 190 lb (86.2 kg) 187 lb 4.8 oz (85 kg)    Telemetry    Atrial fibrillation - Personally Reviewed  ECG    No new EKG today - Personally Reviewed  Physical Exam   GEN: No acute distress.   Neck: No JVD Cardiac: irregularly irregular no murmurs, rubs, or gallops.  Respiratory: Clear to auscultation bilaterally. GI: Soft, nontender, non-distended  MS: No edema; No deformity. Neuro:  Nonfocal  Psych: Normal affect   Labs    Chemistry Recent Labs Lab 02/26/17 0838 02/27/17 0432  NA 141 138  K 4.7 4.5  CL 110 103  CO2 24 25  GLUCOSE 64* 99  BUN 18 21*  CREATININE 0.95 1.06*  CALCIUM 9.2 9.3  PROT  --  6.6  ALBUMIN  --  4.0  AST  --  44*  ALT  --  59*  ALKPHOS  --  40  BILITOT  --  1.6*  GFRNONAA >60 58*  GFRAA >60 >60  ANIONGAP 7 10      Hematology Recent Labs Lab 02/26/17 0838  WBC 46.4*  RBC 4.57  HGB 12.2  HCT 38.8  MCV 84.9  MCH 26.7  MCHC 31.4  RDW 15.4  PLT 275    Cardiac Enzymes Recent Labs Lab 02/26/17 1836 02/26/17 2223 02/27/17 0432  TROPONINI <0.03 <0.03 <0.03    Recent Labs Lab 02/26/17 0855  TROPIPOC 0.01     BNP Recent Labs Lab 02/26/17 0838  BNP 207.9*     DDimer No results for input(s): DDIMER in the last 168 hours.   Radiology    Dg Chest Portable 1 View  Result Date: 02/26/2017 CLINICAL DATA:  Shortness of breath EXAM: PORTABLE CHEST 1 VIEW COMPARISON:  None FINDINGS: Cardiomegaly and mild interstitial prominence. No other acute abnormalities. IMPRESSION: Findings are most consistent with cardiomegaly and mild edema. Electronically Signed   By: Dorise Bullion III M.D  On: 02/26/2017 09:02    Cardiac Studies  2D echo 02/26/2017 Study Conclusions  - Left ventricle: The cavity size was normal. Wall thickness was   increased in a pattern of mild LVH. Systolic function was   moderately to severely reduced. The estimated ejection fraction   was in the range of 30% to 35%. Diffuse hypokinesis. - Mitral valve: There was moderate to severe regurgitation. Valve   area by continuity equation (using LVOT flow): 2.07 cm^2. - Left atrium: The atrium was mildly dilated. - Right atrium: The atrium was mildly dilated. - Pericardium, extracardiac: A trivial pericardial effusion was   identified.  Impressions:  - Moderate to severe global reduction in LV systolic function; mild   LVH; moderate to severe MR; mild biatrial enlargement; mild TR.  Patient Profile     56 y.o. female with a history of PAF and has been on Eliquis.  She has chronic diastolic CHF, CLL, HTN and hyperlipidemia.  She started feeling bad saying that her chest felt like it was being stretched up into her throat and it was hard to take a deep breath but denied any chest pressure.  She did get diaphoretic  at  work and had to walk outside.  She eventually came to the ER and was found to be in atrial fibrillation with fairly controlled VR in the 90's.  BNP was elevated at 207 with chest xray showing CM with mild edema.  She also was hypertensive with BP 183/144mmHg and said that her BP is never that high  Assessment & Plan    1. Atrial fibrillation with RVR: Pt has history of persistent afib and is on Eliquis. She endorses being complaint with medication. It is unclear how long she has been in the abnormal rhythm and could have been triggered by recent URI or new LV dysfunction with CHF. - She is now rate controlled on IV Diltiazem but will have to stop due to LV dysfunction as well as interaction with chemo med. - Will continue coreg 12.5mg  BID which was uptitrated last night - HR now controlled on the 80's - Continue Eliquis - check TSH - cannot DCCV as she has been noncompliant with Eliquis and missed several doses in the past month.  The Endo schedule is full today so cannot plan TEE/DCCV.    2. Hypertension:  - BP much improved on Cardizem gtt and Coreg - Continue Coreg to 12.5mg  BID.  Cannot continue Cardizem due to interaction with chemo drug.   - D/C cardizem due to LV dysfunction and interaction with chemo drug - change ARB to Losartan 50mg  daily as BP now soft and need room for titration of coreg for rate control.  3. 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 Secondary to tachycardia induced CM - She put out 2.8L yesterday and 2.7L net neg.  Weight down 6lbs.  - Continue Coreg and ARB - She appears to be euvolemic on exam so will change to Lasix 40mg  PO daily.   - add aldactone 12.5mg  daily.  4.  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  Echo 7 months ago with no significant MR.  Hopefully this will improve as EF improves.   Hopefully home in am.   Signed, Fransico Him,  MD  02/27/2017, 8:56 AM

## 2017-02-27 NOTE — H&P (Signed)
Attestation signed by Sueanne Margarita, MD at 02/26/2017 4:47 PM  Patient seen and independently examined with Delos Haring, PA. We discussed all aspects of the encounter. I agree with the assessment and plan as stated above.  This patient is well known to Dr. Sallyanne Kuster with a history of PAF and has been on Eliquis.  She has chronic diastolic CHF, CLL, HTN and hyperlipidemia.  She states that she does not salt her food but is not compliant in avoiding food high in sodium.  She says that she loves her hot sauce and uses it frequently including last night.  She says that she has been feeling fatigued with SOB over the past few weeks after having a cold.  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    This am she started feeling bad but has a difficult time explaining how she feels.  She says that her chest feels like it is being stretched up into her throat and it is hard to take a deep breath but denies any chest pressure.  She did get diaphoretic this am at work and had to walk outside.  She eventually came to the ER and was found to be in atrial fibrillation with fairly controlled VR in the 90's.  BNP was elevated at 207 with chest xray showing CM with mild edema.  She also is hypertensive with BP 183/159mmHg and says that her BP is never that high. Exam shows lungs CTA with heart irregularly irregular with no M/R/G and no LE edema. EKG showed atrial fibrillation with CVR, LVH with repol and LAFB.  Suspect that her cold a few weeks ago triggered recurrent afib and dietary indiscretion with sodium led to poorly controlled HTN.  The afib and poorly controlled HTN likely also playing a role in acute on chronic diastolic CHF.  Will admit to tele bed.  Cycle enzymes (initial trop neg), start lasix 40mg  IV BID, follow renal function and K+ closely while diuresing, continue IV Cardizem gtt.  Increase Coreg to 12.5mg  BID for better  BP control.  Check 2D echo to reassess LVF.  She will continue Eliquis.  As she has not been compliant and has missed several doses of her Eliquis this month, would not proceed with DCCV at this time and there is no room on schedule for TEE/DCCV tomorrow.    Expand All Collapse All   [] Hide copied text   Cardiology Consultation:   Patient ID: Audrey Gross; [id number]; [birth year]   Admit date: 02/26/2017 Date of Consult: 02/26/2017  Primary Care Provider: Janith Lima, MD Primary Cardiologist: Dr. Sallyanne Kuster Primary Electrophysiologist:  None   Patient Profile:   Audrey Gross is a 56 y.o. female with a hx of atrial fibrillation-on Eliquis, CLL on Imruvica, chronic diastolic HF on spironolactone, hypertension, hyperlipidemia who is being seen today for the evaluation of atrial fibrillation with RVR and fluid overload at the request of Dr. Tamera Punt.  History of Present Illness:   Audrey Gross had not been feeling well over the past two weeks. Endorsing a vague fatigue and mild SOB that she can best describe as "not feeling right". 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 She reports not sleeping well at night. She endorses being compliant with her medications. This morning she woke up feeling very poor, came to the ER and  was found to be in atrial fibrillation with RVR rate 130's on EKG. A cardizem drip was started and she is now rate controlled between 60-80's. Chest xray shows cardiomegaly and mild edema, BNP 207.9, K 4.7, Na 141, Creatinine 0.95, trop 0.01,  WBC 46.4 (CLL), Hgb12.2  The patient now says that she did feel very poorly this morning but after her rate has been controlled she is feeling much better but still doesn't feel "right". Endorses mild SOB. Denies chest pain, back pain, headache, weakness, asymmetrical extremity swelling. + palpitations.         Past Medical History:  Diagnosis Date  . Atrial fibrillation ([place])   .  CLL (chronic lymphoblastic leukemia)    Leukemia  . Cystic breast   . Depression   . Diffuse cystic mastopathy   . Dyslipidemia (high LDL; low HDL)   . Hypertension   . Iron deficiency anemia, unspecified   . Lymphadenopathy of head and neck 04/05/2015  . Medical non-compliance   . Systolic ejection murmur          Past Surgical History:  Procedure Laterality Date  . TUBAL LIGATION       Inpatient Medications: Scheduled Meds: . furosemide  40 mg Intravenous Once   Continuous Infusions: . diltiazem (CARDIZEM) infusion 5 mg/hr (02/26/17 1024)   PRN Meds:   Allergies:   No Known Allergies  Social History:   Social History        Social History  . Marital status: Legally Separated    Spouse name: N/A  . Number of children: 6  . Years of education: N/A   Occupational History  . Inspector [employer]        Social History Main Topics  . Smoking status: Never Smoker  . Smokeless tobacco: Never Used  . Alcohol use No  . Drug use: No  . Sexual activity: Yes    Partners: Male    Birth control/ protection: Surgical       Other Topics Concern  . Not on file      Social History Narrative  . No narrative on file    Family History:   The patient's family history includes Heart disease in her brother and maternal grandmother; Kidney disease in her father; Stomach cancer in her maternal aunt and paternal grandmother; Sudden death in her mother.  ROS:  Please see the history of present illness.  All other ROS reviewed and negative.     Physical Exam/Data:         Vitals:   02/26/17 1330 02/26/17 1345 02/26/17 1400 02/26/17 1445  BP: (!) 146/107 (!) 132/105 (!) 144/99 (!) 146/108  Pulse: (!) 45 (!) 56  76  Resp: (!) 22 (!) 23 20 (!) 26  Temp:      TempSrc:      SpO2: 99% 100% 98% 98%  Weight:      Height:       No intake or output data in the 24 hours ending 02/26/17 1502    Filed Weights   02/26/17 0833    Weight: 193 lb (87.5 kg)   Body mass index is 30.23 kg/m.  General: Well developed, well nourished, in no acute distress. Head: Normocephalic, atraumatic, sclera non-icteric, no xanthomas, nares are without discharge.       Neck: Negative for carotid bruits. JVD not elevated. Lungs: Clear bilaterally to auscultation without wheezes, rales, or rhonchi. Breathing is unlabored. Heart: RRR with S1 S2. No murmurs, rubs, or gallops appreciated. Abdomen:  Soft, non-tender, non-distended with normoactive bowel sounds. No hepatomegaly. No rebound/guarding. No obvious abdominal masses. Msk:  Strength and tone appear normal for age. Trace edema to bilateralle Extremities: No clubbing or cyanosis. No edema.  Distal pedal pulses are 2+ and equal bilaterally. Neuro: Alert and oriented X 3. No facial asymmetry. No focal deficit. Moves all extremities spontaneously. Psych:  Responds to questions appropriately with a normal affect.  EKG:  The EKG was personally reviewed and demonstrates a fib with RVR, HR 137  Relevant CV Studies:  Transthoracic echocardiogram 07/07/2016 ------------------------------------------------------------------- Study Conclusions  - Left ventricle: The cavity size was normal. There was moderate concentric hypertrophy. Systolic function was normal. The estimated ejection fraction was in the range of 55% to 60%. Wall motion was normal; there were no regional wall motion abnormalities. Left ventricular diastolic function parameters were normal. - Aortic valve: Trileaflet; normal thickness leaflets. Transvalvular velocity was within the normal range. There was no stenosis. There was no regurgitation. - Aortic root: The aortic root was normal in size. - Ascending aorta: The ascending aorta was normal in size. - Mitral valve: Structurally normal valve. There was trivial regurgitation. - Left atrium: The atrium was normal in size. - Right ventricle: The  cavity size was normal. Wall thickness was normal. Systolic function was normal. - Tricuspid valve: There was trivial regurgitation. - Pulmonary arteries: Systolic pressure was within the normal range. - Inferior vena cava: The vessel was normal in size. - Pericardium, extracardiac: There was no pericardial effusion.  Laboratory Data:  Chemistry Last Labs    Recent Labs Lab 02/26/17 0838  NA 141  K 4.7  CL 110  CO2 24  GLUCOSE 64*  BUN 18  CREATININE 0.95  CALCIUM 9.2  GFRNONAA >60  GFRAA >60  ANIONGAP 7      Hematology Last Labs    Recent Labs Lab 02/26/17 0838  WBC 46.4*  RBC 4.57  HGB 12.2  HCT 38.8  MCV 84.9  MCH 26.7  MCHC 31.4  RDW 15.4  PLT 275     Cardiac Enzymes Last Labs   No results for input(s): TROPONINI in the last 168 hours.    Recent Labs Lab 02/26/17 0855  TROPIPOC 0.01    BNP Last Labs    Recent Labs Lab 02/26/17 0838  BNP 207.9*      DDimer  Last Labs   No results for input(s): DDIMER in the last 168 hours.    Radiology/Studies:  Dg Chest Portable 1 View  Result Date: 02/26/2017 CLINICAL DATA:  Shortness of breath EXAM: PORTABLE CHEST 1 VIEW COMPARISON:  None FINDINGS: Cardiomegaly and mild interstitial prominence. No other acute abnormalities. IMPRESSION: Findings are most consistent with cardiomegaly and mild edema. Electronically Signed   By: Dorise Bullion III M.D   On: 02/26/2017 09:02    Assessment and Plan:   1. Atrial fibrillation with RVR: Pt has history of a fib and is on Eliquis. She endorses being complaint with medication. It is unclear how long she has been in the abnormal rhythm but likely the RVR started recently. She is now rate controlled on IV Diltiazem. She takes Coreg  6.25 mg BID, I recommend increasing this to 12.5 mg BID BP is not well controlled in ED systolic ~ 741'O and diastolic 87-867.  2. Hypertension: Recommend increasing dose of Coreg to 12.5mg  as stated above.  3. Acute  on chronic diastolic heart failure: normal EF October 2017, I will give he 40 mg IV lasix. Discussed this  with Dr. Radford Pax, will order STAT echo to evaluate heart function.  Further disposition will based on how patient feels after IV lasix and STAT echo.  SignedLinus Mako, PA-C  02/26/2017 3:02 PM    Cosigned by: Sueanne Margarita, MD at 02/26/2017 4:47 PM  Revision History                   Routing History

## 2017-02-28 DIAGNOSIS — I340 Nonrheumatic mitral (valve) insufficiency: Secondary | ICD-10-CM

## 2017-02-28 DIAGNOSIS — I481 Persistent atrial fibrillation: Secondary | ICD-10-CM | POA: Diagnosis not present

## 2017-02-28 DIAGNOSIS — I081 Rheumatic disorders of both mitral and tricuspid valves: Secondary | ICD-10-CM | POA: Diagnosis not present

## 2017-02-28 DIAGNOSIS — I110 Hypertensive heart disease with heart failure: Secondary | ICD-10-CM | POA: Diagnosis not present

## 2017-02-28 DIAGNOSIS — R61 Generalized hyperhidrosis: Secondary | ICD-10-CM | POA: Diagnosis not present

## 2017-02-28 DIAGNOSIS — C9110 Chronic lymphocytic leukemia of B-cell type not having achieved remission: Secondary | ICD-10-CM | POA: Diagnosis not present

## 2017-02-28 DIAGNOSIS — E781 Pure hyperglyceridemia: Secondary | ICD-10-CM | POA: Diagnosis not present

## 2017-02-28 DIAGNOSIS — Z79899 Other long term (current) drug therapy: Secondary | ICD-10-CM | POA: Diagnosis not present

## 2017-02-28 DIAGNOSIS — I428 Other cardiomyopathies: Secondary | ICD-10-CM | POA: Diagnosis not present

## 2017-02-28 DIAGNOSIS — E785 Hyperlipidemia, unspecified: Secondary | ICD-10-CM | POA: Diagnosis not present

## 2017-02-28 DIAGNOSIS — I160 Hypertensive urgency: Secondary | ICD-10-CM | POA: Diagnosis not present

## 2017-02-28 DIAGNOSIS — R000 Tachycardia, unspecified: Secondary | ICD-10-CM | POA: Diagnosis not present

## 2017-02-28 DIAGNOSIS — I5043 Acute on chronic combined systolic (congestive) and diastolic (congestive) heart failure: Secondary | ICD-10-CM | POA: Diagnosis not present

## 2017-02-28 DIAGNOSIS — R011 Cardiac murmur, unspecified: Secondary | ICD-10-CM | POA: Diagnosis not present

## 2017-02-28 DIAGNOSIS — I4891 Unspecified atrial fibrillation: Secondary | ICD-10-CM | POA: Diagnosis not present

## 2017-02-28 DIAGNOSIS — F329 Major depressive disorder, single episode, unspecified: Secondary | ICD-10-CM | POA: Diagnosis not present

## 2017-02-28 DIAGNOSIS — I5033 Acute on chronic diastolic (congestive) heart failure: Secondary | ICD-10-CM | POA: Diagnosis not present

## 2017-02-28 DIAGNOSIS — I10 Essential (primary) hypertension: Secondary | ICD-10-CM | POA: Diagnosis not present

## 2017-02-28 DIAGNOSIS — I480 Paroxysmal atrial fibrillation: Secondary | ICD-10-CM | POA: Diagnosis not present

## 2017-02-28 DIAGNOSIS — E876 Hypokalemia: Secondary | ICD-10-CM | POA: Diagnosis not present

## 2017-02-28 MED ORDER — CARVEDILOL 12.5 MG PO TABS: 12.5000 mg | ORAL_TABLET | Freq: Two times a day (BID) | ORAL | 6 refills | Status: DC

## 2017-02-28 MED ORDER — FUROSEMIDE 40 MG PO TABS: 40.0000 mg | ORAL_TABLET | Freq: Every day | ORAL | 6 refills | Status: DC

## 2017-02-28 MED ORDER — LOSARTAN POTASSIUM 50 MG PO TABS: 50.0000 mg | ORAL_TABLET | Freq: Every day | ORAL | 6 refills | Status: DC

## 2017-02-28 MED ORDER — SPIRONOLACTONE 25 MG PO TABS: 12.5000 mg | ORAL_TABLET | Freq: Every day | ORAL | 3 refills | Status: DC

## 2017-02-28 NOTE — Care Management Note (Signed)
Case Management Note  Patient Details  Name: Audrey Gross MRN: [id number] Date of Birth: March 08, 1961  Subjective/Objective:                 Patient with order to DC to home. No CM needs identified at this time.    Action/Plan:   Expected Discharge Date:  02/28/17               Expected Discharge Plan:  Home/Self Care  In-House Referral:     Discharge planning Services  CM Consult  Post Acute Care Choice:    Choice offered to:     DME Arranged:    DME Agency:     HH Arranged:    HH Agency:     Status of Service:  Completed, signed off  If discussed at [employer] of Stay Meetings, dates discussed:    Additional Comments:  Carles Collet, RN 02/28/2017, 2:23 PM

## 2017-02-28 NOTE — Progress Notes (Signed)
Progress Note  Patient Name: Audrey Gross Date of Encounter: 02/28/2017  Primary Cardiologist: Dr. Sallyanne Kuster  Subjective   Feels better this am, remains in a-fib, HR better controlled  Inpatient Medications    Scheduled Meds: . apixaban  5 mg Oral BID  . carvedilol  12.5 mg Oral BID WC  . cholecalciferol  1,000 Units Oral [id number]  . furosemide  40 mg Oral Daily  . ibrutinib  420 mg Oral Daily  . losartan  50 mg Oral Daily  . potassium chloride SA  20 mEq Oral BID  . spironolactone  12.5 mg Oral Daily  . spironolactone  25 mg Oral Daily   Continuous Infusions:  PRN Meds: acetaminophen, ondansetron (ZOFRAN) IV   Vital Signs    Vitals:   02/27/17 2004 02/27/17 2058 02/28/17 0359 02/28/17 0841  BP: 104/63  96/70 (!) 112/57  Pulse: (!) 53 98 (!) 105 62  Resp: 18  18   Temp: 98.4 F (36.9 C)  97.6 F (36.4 C)   TempSrc: Oral  Oral   SpO2: 98%  99%   Weight:   189 lb 4.8 oz (85.9 kg)   Height:        Intake/Output Summary (Last 24 hours) at 02/28/17 1003 Last data filed at 02/28/17 0601  Gross per 24 hour  Intake                0 ml  Output              350 ml  Net             -350 ml   Filed Weights   02/26/17 1740 02/27/17 0519 02/28/17 0359  Weight: 190 lb (86.2 kg) 187 lb 4.8 oz (85 kg) 189 lb 4.8 oz (85.9 kg)    Telemetry    Atrial fibrillation - Personally Reviewed  ECG    No new EKG today - Personally Reviewed  Physical Exam   GEN: No acute distress.   Neck: No JVD Cardiac: irregularly irregular no murmurs, rubs, or gallops.  Respiratory: Clear to auscultation bilaterally. GI: Soft, nontender, non-distended  MS: No edema; No deformity. Neuro:  Nonfocal  Psych: Normal affect   Labs    Chemistry  Recent Labs Lab 02/26/17 0838 02/27/17 0432  NA 141 138  K 4.7 4.5  CL 110 103  CO2 24 25  GLUCOSE 64* 99  BUN 18 21*  CREATININE 0.95 1.06*  CALCIUM 9.2 9.3  PROT  --  6.6  ALBUMIN  --  4.0  AST  --  44*  ALT  --  59*  ALKPHOS   --  40  BILITOT  --  1.6*  GFRNONAA >60 58*  GFRAA >60 >60  ANIONGAP 7 10     Hematology  Recent Labs Lab 02/26/17 0838  WBC 46.4*  RBC 4.57  HGB 12.2  HCT 38.8  MCV 84.9  MCH 26.7  MCHC 31.4  RDW 15.4  PLT 275    Cardiac Enzymes  Recent Labs Lab 02/26/17 1836 02/26/17 2223 02/27/17 0432  TROPONINI <0.03 <0.03 <0.03     Recent Labs Lab 02/26/17 0855  TROPIPOC 0.01     BNP  Recent Labs Lab 02/26/17 0838  BNP 207.9*     DDimer No results for input(s): DDIMER in the last 168 hours.   Radiology    No results found.  Cardiac Studies  2D echo 02/26/2017 Study Conclusions  - Left ventricle: The cavity size was normal.  Wall thickness was   increased in a pattern of mild LVH. Systolic function was   moderately to severely reduced. The estimated ejection fraction   was in the range of 30% to 35%. Diffuse hypokinesis. - Mitral valve: There was moderate to severe regurgitation. Valve   area by continuity equation (using LVOT flow): 2.07 cm^2. - Left atrium: The atrium was mildly dilated. - Right atrium: The atrium was mildly dilated. - Pericardium, extracardiac: A trivial pericardial effusion was   identified.  Impressions:  - Moderate to severe global reduction in LV systolic function; mild   LVH; moderate to severe MR; mild biatrial enlargement; mild TR.  Patient Profile     56 y.o. female with a history of PAF and has been on Eliquis.  She has chronic diastolic CHF, CLL, HTN and hyperlipidemia.  She started feeling bad saying that her chest felt like it was being stretched up into her throat and it was hard to take a deep breath but denied any chest pressure.  She did get diaphoretic  at work and had to walk outside.  She eventually came to the ER and was found to be in atrial fibrillation with fairly controlled VR in the 90's.  BNP was elevated at 207 with chest xray showing CM with mild edema.  She also was hypertensive with BP 183/192mmHg and  said that her BP is never that high  Assessment & Plan    1. Atrial fibrillation with RVR: Pt has history of persistent afib and is on Eliquis. She endorses being complaint with medication. It is unclear how long she has been in the abnormal rhythm and could have been triggered by recent URI or new LV dysfunction with CHF. - She was on IV Diltiazem but had to stop due to LV dysfunction as well as interaction with chemo med. - Will continue coreg 12.5mg  BID which was uptitrated last night - HR now controlled on the 80's - Continue Eliquis - check TSH - couldnt DCCV yesterday as she has been noncompliant with Eliquis and missed several doses in the past month.  The Endo schedule is full today so cannot plan TEE/DCCV.    We will discharge today and follow up later next week at the [facility] clinic, if still in a-fib, schedule a DCCV after 4 weeks of full anticoagulation.   2. Hypertension:  - BP much improved on Cardizem gtt and Coreg - Continue Coreg to 12.5mg  BID.  Cannot continue Cardizem due to interaction with chemo drug.   - change ARB to Losartan 50mg  daily as BP now soft and need room for titration of coreg for rate control.  3. 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 Secondary to tachycardia induced CM - She put out 2.8L yesterday and 2.7L net neg.  Weight down 7lbs.  - Continue Coreg and ARB - She appears to be euvolemic on exam so will change to Lasix 40mg  PO daily.   - add aldactone 12.5mg  daily.  4.  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  Echo 7 months ago with no significant MR.  Hopefully this will improve as EF improves.   Discharge today.   Signed, Ena Dawley, MD  02/28/2017, 10:03 AM

## 2017-02-28 NOTE — Discharge Instructions (Signed)
***  PLEASE REMEMBER TO BRING ALL OF YOUR MEDICATIONS TO EACH OF YOUR FOLLOW-UP OFFICE VISITS.  

## 2017-02-28 NOTE — Discharge Summary (Signed)
Discharge Summary    Patient ID: Audrey Gross,  MRN: [id number], DOB/AGE: 1961/01/10 56 y.o.  Admit date: 02/26/2017 Discharge date: 02/28/2017  Primary Care Provider: Janith Lima Primary Cardiologist: Jerilynn Mages. Croitoru, MD   Discharge Diagnoses    Principal Problem:   Atrial fibrillation  Active Problems:   Acute on chronic combined systolic and diastolic CHF (congestive heart failure) (HCC)  **Net negative 3L this admission.     Hypertension w/ hypertensive urgency   Mitral regurgitation   CLL (chronic lymphocytic leukemia) (HCC)   Dyslipidemia (high LDL; low HDL)  Allergies No Known Allergies  Diagnostic Studies/Procedures    2D Echocardiogram 5.24.208 Study Conclusions   - Left ventricle: The cavity size was normal. Wall thickness was   increased in a pattern of mild LVH. Systolic function was   moderately to severely reduced. The estimated ejection fraction   was in the range of 30% to 35%. Diffuse hypokinesis. - Mitral valve: There was moderate to severe regurgitation. Valve   area by continuity equation (using LVOT flow): 2.07 cm^2. - Left atrium: The atrium was mildly dilated. - Right atrium: The atrium was mildly dilated. - Pericardium, extracardiac: A trivial pericardial effusion was   identified.   Impressions:   - Moderate to severe global reduction in LV systolic function; mild   LVH; moderate to severe MR; mild biatrial enlargement; mild TR. _____________   History of Present Illness     56 y/o ? with a h/o PAF on chronic eliquis, noncopliance, CLL, diastolic CHF, HTN, HL, and obesity, who presented to the [facility] ED on 5/24 with complaints of feeling poorly associated with diaphoresis.  In the ED, she was hypertensive and noted to be in atrial fibrillation with rates in the 90's. CXR showed CM and mild edema.  She was admitted for further evaluation.  Hospital Course     Consultants: None   Following admission, her  blocker dose was titrated.   She was initially placed on diltiazem gtt, however, echo showed EF of 30-35% with mod to severe MR, thus dilt was discontinued.  She was diuresed with improvement in volume status and she was transitioned to oral lasix and spironolactone.  Afib rates were well-controlled on oral  blocker.  Though she is on chronic eliquis, she reported multiple missed doses, thus we were unable to cardiovert and due to scheduling conflicts, we were unable to schedule for TEE, which would have been required prior to DCCV.    As she has been feeling well and ambulating without difficulty, we will discharge her home today.  We stressed the importance of compliance.  We are hopeful that LV function and MR improves with restoration of sinus rhythm.  She will f/u in clinic in ~ 3 wks, at which point, if she remains in Afib and has been compliant with eliquis, we will plan on DCCV. _____________  Discharge Vitals Blood pressure (!) 112/57, pulse 62, temperature 97.6 F (36.4 C), temperature source Oral, resp. rate 18, height 5\' 7"  (1.702 m), weight 189 lb 4.8 oz (85.9 kg), last menstrual period 12/27/2016, SpO2 99 %.  Filed Weights   02/26/17 1740 02/27/17 0519 02/28/17 0359  Weight: 190 lb (86.2 kg) 187 lb 4.8 oz (85 kg) 189 lb 4.8 oz (85.9 kg)    Labs & Radiologic Studies    CBC  Recent Labs  02/26/17 0838  WBC 46.4*  HGB 12.2  HCT 38.8  MCV 84.9  PLT 244   Basic Metabolic  Panel  Recent Labs  02/26/17 0838 02/27/17 0432  NA 141 138  K 4.7 4.5  CL 110 103  CO2 24 25  GLUCOSE 64* 99  BUN 18 21*  CREATININE 0.95 1.06*  CALCIUM 9.2 9.3   Liver Function Tests  Recent Labs  02/27/17 0432  AST 44*  ALT 59*  ALKPHOS 40  BILITOT 1.6*  PROT 6.6  ALBUMIN 4.0   Cardiac Enzymes  Recent Labs  02/26/17 1836 02/26/17 2223 02/27/17 0432  TROPONINI <0.03 <0.03 <0.03   Fasting Lipid Panel  Recent Labs  02/27/17 0432  CHOL 182  HDL 34*  LDLCALC 121*  TRIG 135  CHOLHDL 5.4    _____________  Dg Chest Portable 1 View  Result Date: 02/26/2017 CLINICAL DATA:  Shortness of breath EXAM: PORTABLE CHEST 1 VIEW COMPARISON:  None FINDINGS: Cardiomegaly and mild interstitial prominence. No other acute abnormalities. IMPRESSION: Findings are most consistent with cardiomegaly and mild edema. Electronically Signed   By: Dorise Bullion III M.D   On: 02/26/2017 09:02   Disposition   Pt is being discharged home today in good condition.  Follow-up Plans & Appointments    Follow-up Information    Erlene Quan, PA-C Follow up on 03/23/2017.   Specialties:  Cardiology, Radiology Why:  Please arrive at 8:45 am for a 9 am appt. Contact information: [place] [address] [phone]           Discharge Medications   Current Discharge Medication List    START taking these medications   Details  furosemide (LASIX) 40 MG tablet Take 1 tablet (40 mg total) by mouth daily. Qty: 30 tablet, Refills: 6    losartan (COZAAR) 50 MG tablet Take 1 tablet (50 mg total) by mouth daily. Qty: 30 tablet, Refills: 6      CONTINUE these medications which have CHANGED   Details  carvedilol (COREG) 12.5 MG tablet Take 1 tablet (12.5 mg total) by mouth 2 (two) times daily with a meal. Qty: 60 tablet, Refills: 6    spironolactone (ALDACTONE) 25 MG tablet Take 0.5 tablets (12.5 mg total) by mouth daily. Qty: 30 tablet, Refills: 3   Associated Diagnoses: Hypokalemia; Essential hypertension      CONTINUE these medications which have NOT CHANGED   Details  apixaban (ELIQUIS) 5 MG TABS tablet Take 1 tablet (5 mg total) by mouth 2 (two) times daily. Qty: 60 tablet, Refills: 11    IMBRUVICA 140 MG capsul TAKE 3 CAPSULES BY MOUTH DAILY Qty: 90 capsule, Refills: 3    medroxyPROGESTERone (PROVERA) 5 MG tablet TAKE 1 TABLET BY MOUTH DAILY FOR 5 DAYS EVERY OTHER MONTH IF NO SPONTANEOUS MENSES Qty: 15 tablet, Refills: 1    potassium chloride SA  (K-DUR,KLOR-CON) 20 MEQ tablet Take 1 tablet (20 mEq total) by mouth 2 (two) times daily. Qty: 180 tablet, Refills: 1   Associated Diagnoses: Hypokalemia; Essential hypertension, benign    Vitamin D, Cholecalciferol, 1000 units CAPS Take 1,000 Units by mouth every morning.       STOP taking these medications     telmisartan (MICARDIS) 40 MG tablet          Outstanding Labs/Studies   None  Duration of Discharge Encounter   Greater than 30 minutes including physician time.  Signed, Murray Hodgkins NP 02/28/2017, 11:20 AM

## 2017-03-03 ENCOUNTER — Ambulatory Visit (HOSPITAL_COMMUNITY)
Admission: EM | Admit: 2017-03-03 | Discharge: 2017-03-03 | Disposition: A | Discharge status: Nursing Home (Includes ICF) | Payer: BLUE CROSS/BLUE SHIELD

## 2017-03-03 ENCOUNTER — Other Ambulatory Visit: Payer: Self-pay | Admitting: Hematology and Oncology

## 2017-03-03 MED FILL — IMBRUVICA 140 MG CAPSULE: 140 | 30 days supply | Qty: 90 | Fill #0

## 2017-03-23 ENCOUNTER — Other Ambulatory Visit: Payer: Self-pay | Admitting: Internal Medicine

## 2017-03-23 ENCOUNTER — Encounter: Payer: Self-pay | Admitting: Internal Medicine

## 2017-03-23 ENCOUNTER — Ambulatory Visit (INDEPENDENT_AMBULATORY_CARE_PROVIDER_SITE_OTHER): Payer: BLUE CROSS/BLUE SHIELD | Admitting: Internal Medicine

## 2017-03-23 ENCOUNTER — Encounter: Payer: Self-pay | Admitting: Cardiology

## 2017-03-23 ENCOUNTER — Ambulatory Visit (INDEPENDENT_AMBULATORY_CARE_PROVIDER_SITE_OTHER): Payer: BLUE CROSS/BLUE SHIELD | Admitting: Cardiology

## 2017-03-23 VITALS — BP 96/72 | HR 56 | Ht 67.0 in | Wt 192.0 lb

## 2017-03-23 VITALS — BP 110/60 | HR 58 | Temp 98.3°F | Resp 16 | Ht 67.0 in | Wt 193.8 lb

## 2017-03-23 DIAGNOSIS — Z7901 Long term (current) use of anticoagulants: Secondary | ICD-10-CM

## 2017-03-23 DIAGNOSIS — I4891 Unspecified atrial fibrillation: Secondary | ICD-10-CM | POA: Diagnosis not present

## 2017-03-23 DIAGNOSIS — I160 Hypertensive urgency: Secondary | ICD-10-CM

## 2017-03-23 DIAGNOSIS — D3A020 Benign carcinoid tumor of the appendix: Secondary | ICD-10-CM | POA: Diagnosis not present

## 2017-03-23 DIAGNOSIS — D3A029 Benign carcinoid tumor of the large intestine, unspecified portion: Secondary | ICD-10-CM | POA: Diagnosis not present

## 2017-03-23 DIAGNOSIS — E876 Hypokalemia: Secondary | ICD-10-CM

## 2017-03-23 DIAGNOSIS — I5033 Acute on chronic diastolic (congestive) heart failure: Secondary | ICD-10-CM

## 2017-03-23 DIAGNOSIS — C9110 Chronic lymphocytic leukemia of B-cell type not having achieved remission: Secondary | ICD-10-CM

## 2017-03-23 DIAGNOSIS — D3A026 Benign carcinoid tumor of the rectum: Secondary | ICD-10-CM

## 2017-03-23 DIAGNOSIS — Z01810 Encounter for preprocedural cardiovascular examination: Secondary | ICD-10-CM

## 2017-03-23 DIAGNOSIS — I10 Essential (primary) hypertension: Secondary | ICD-10-CM

## 2017-03-23 DIAGNOSIS — I340 Nonrheumatic mitral (valve) insufficiency: Secondary | ICD-10-CM

## 2017-03-23 DIAGNOSIS — C9190 Lymphoid leukemia, unspecified not having achieved remission: Secondary | ICD-10-CM

## 2017-03-23 DIAGNOSIS — I4819 Other persistent atrial fibrillation: Secondary | ICD-10-CM

## 2017-03-23 DIAGNOSIS — I481 Persistent atrial fibrillation: Secondary | ICD-10-CM | POA: Diagnosis not present

## 2017-03-23 MED ORDER — POTASSIUM CHLORIDE CRYS ER 20 MEQ PO TBCR: 20.0000 meq | EXTENDED_RELEASE_TABLET | Freq: Every day | ORAL | 1 refills | Status: DC

## 2017-03-23 MED ORDER — LOSARTAN POTASSIUM 25 MG PO TABS: 25.0000 mg | ORAL_TABLET | Freq: Every day | ORAL | 1 refills | Status: DC

## 2017-03-23 NOTE — Assessment & Plan Note (Signed)
HTN on admission 5/24-now borderline hypotensive

## 2017-03-23 NOTE — Patient Instructions (Signed)
Medication Instructions:   REDUCE dose of losartan (cozaar) to 25mg  and take ONCE DAILY IN THE EVENING instead of morning.  REDUCE dose of potassium to 55meq ONCE DAILY  Labwork:   BMET, CBC, TSH, Magnesium today    Testing/Procedures:  Your physician has recommended that you have a Cardioversion (DCCV). Electrical Cardioversion uses a jolt of electricity to your heart either through paddles or wired patches attached to your chest. This is a controlled, usually prescheduled, procedure. Defibrillation is done under light anesthesia in the hospital, and you usually go home the day of the procedure. This is done to get your heart back into a normal rhythm. You are not awake for the procedure. Please see the instruction sheet given to you today.    Follow-Up:  With [facility] in 2 weeks    If you need a refill on your cardiac medications before your next appointment, please call your pharmacy.

## 2017-03-23 NOTE — Patient Instructions (Signed)

## 2017-03-23 NOTE — Assessment & Plan Note (Signed)
[id number]

## 2017-03-23 NOTE — Assessment & Plan Note (Signed)
Rate now slow-56

## 2017-03-23 NOTE — Assessment & Plan Note (Signed)
Pt admitted with acute on chronic CHF 5/24-5/26/18

## 2017-03-23 NOTE — Assessment & Plan Note (Signed)
Moderate to severe by echo May 2018

## 2017-03-23 NOTE — Progress Notes (Signed)
03/23/2017 Audrey Gross   [birth year]  [id number]  Primary Physician Janith Lima, MD Primary Cardiologist: Dr Sallyanne Kuster  HPI:  57 y/o female with a history of HTN, HCVD, MR, and PAF, admitted 02/26/17 with DOE, uncontrolled HTN, AF, and acute on chronic mixed CHF. Her rate was controlled with beta blocker. She admitted she had not been completely compliant with her medications, including Eliquis. Her admission wgt was 193 lbs, discharge wgt  189 lbs. Today in the office she is 192 lbs. She denies any orthopnea or PND. She has no LE edema. She reports compliance with a ll her medications including Eliquis.    Current Outpatient Prescriptions  Medication Sig Dispense Refill  . apixaban (ELIQUIS) 5 MG TABS tablet Take 1 tablet (5 mg total) by mouth 2 (two) times daily. 60 tablet 11  . carvedilol (COREG) 6.25 MG tablet Take 1 tablet by mouth 2 (two) times daily.    . furosemide (LASIX) 40 MG tablet Take 1 tablet (40 mg total) by mouth daily. 30 tablet 6  . IMBRUVICA 140 MG capsul TAKE 3 CAPSULES BY MOUTH DAILY 90 capsule 3  . KLOR-CON M20 20 MEQ tablet TAKE 1 TABLET (20 MEQ TOTAL) BY MOUTH 2 (TWO) TIMES DAILY. 180 tablet 1  . losartan (COZAAR) 50 MG tablet Take 1 tablet (50 mg total) by mouth daily. 30 tablet 6  . medroxyPROGESTERone (PROVERA) 5 MG tablet TAKE 1 TABLET BY MOUTH DAILY FOR 5 DAYS EVERY OTHER MONTH IF NO SPONTANEOUS MENSES 15 tablet 1  . spironolactone (ALDACTONE) 25 MG tablet Take 0.5 tablets (12.5 mg total) by mouth daily. 30 tablet 3  . Vitamin D, Cholecalciferol, 1000 units CAPS Take 1,000 Units by mouth every morning.      No current facility-administered medications for this visit.     No Known Allergies  Past Medical History:  Diagnosis Date  . Atrial fibrillation ([place])   . CLL (chronic lymphoblastic leukemia)    Leukemia  . Cystic breast   . Depression   . Diffuse cystic mastopathy   . Dyslipidemia (high LDL; low HDL)   . Hypertension   . Iron deficiency  anemia, unspecified   . Lymphadenopathy of head and neck 04/05/2015  . Medical non-compliance   . Mitral regurgitation 02/2017   moderate to severe    Social History   Social History  . Marital status: Legally Separated    Spouse name: N/A  . Number of children: 6  . 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 Smoking status: Never Smoker  . Smokeless tobacco: Never Used  . Alcohol use No  . Drug use: No  . Sexual activity: Yes    Partners: Male    Birth control/ protection: Surgical   Other Topics Concern  . Not on file   Social History Narrative  . No narrative on file     Family History  Problem Relation Age of Onset  . Sudden death Mother   . Kidney disease Father        H/O HD and kidney transplant  . Stomach cancer Maternal Aunt   . Stomach cancer Paternal Grandmother   . Heart disease Brother   . Heart disease Maternal Grandmother      Review of Systems: General: negative for chills, fever, night sweats or weight changes.  Cardiovascular: negative for chest pain, dyspnea on exertion, edema, orthopnea, palpitations, paroxysmal nocturnal dyspnea or shortness  of breath Dermatological: negative for rash Respiratory: negative for cough or wheezing Urologic: negative for hematuria Abdominal: negative for nausea, vomiting, diarrhea, bright red blood per rectum, melena, or hematemesis Neurologic: negative for visual changes, syncope, or dizziness All other systems reviewed and are otherwise negative except as noted above.    Blood pressure 96/72, pulse (!) 56, height 5\' 7"  (1.702 m), weight 192 lb (87.1 kg).  General appearance: alert, cooperative, no distress and mildly obese Neck: no carotid bruit and no JVD Lungs: clear to auscultation bilaterally Heart: irregularly irregular rhythm Extremities: no edema Skin: Skin color, texture, turgor normal. No rashes or lesions Neurologic: Grossly normal  EKG  AF with VR 60, LAD  ASSESSMENT AND PLAN:   Acute on chronic diastolic (congestive) heart failure (HCC) Pt admitted with acute on chronic CHF 5/24-5/26/18  Atrial fibrillation with rapid ventricular response (HCC) Rate now slow-56  CLL (chronic lymphocytic leukemia) (HCC) [id number]  Hypertensive urgency HTN on admission 5/24-now borderline hypotensive  Mitral regurgitation Moderate to severe by echo May 2018  Chronic anticoagulation On Eliquis-CHA2DS2 VASc= 3   PLAN  We discussed OP DCCV and the importance of strict medication compliance. She did have some dizziness and weakness one day at work and her pressure and HR are running a little low- I cut her Cozaar back to 25 mg daily QHS and reduced her K+ supplement to 20 meq daily as she is on Aldactone. I will check pre DCCV labs including a TSH.   Kerin Ransom PA-C 03/23/2017 9:33 AM

## 2017-03-23 NOTE — Assessment & Plan Note (Addendum)
On Eliquis-CHA2DS2 VASc= 3

## 2017-03-23 NOTE — Progress Notes (Signed)
Subjective:  Patient ID: Audrey Gross, female    DOB: 1961-03-16  Age: 56 y.o. MRN: [id number]  CC: Hypertension   HPI Audrey Gross presents for f/up - She had a spell of elevated heart rate and lightheadedness about a week or 2 ago was seen in the ED and was found to be in A. fib with rapid ventricular response. She tells me she's felt much better since then with no recurrent episodes of lightheadedness, dizziness, palpitations. She is scheduled for cardioversion next week. She is aware that her blood pressure has been low. She has a history of carcinoid tumor of the gastrointestinal track and wants me to refer her back to GI for a follow-up regarding this.  Outpatient Medications Prior to Visit  Medication Sig Dispense Refill  . apixaban (ELIQUIS) 5 MG TABS tablet Take 1 tablet (5 mg total) by mouth 2 (two) times daily. 60 tablet 11  . carvedilol (COREG) 6.25 MG tablet Take 1 tablet by mouth 2 (two) times daily.    . furosemide (LASIX) 40 MG tablet Take 1 tablet (40 mg total) by mouth daily. 30 tablet 6  . IMBRUVICA 140 MG capsul TAKE 3 CAPSULES BY MOUTH DAILY 90 capsule 3  . medroxyPROGESTERone (PROVERA) 5 MG tablet TAKE 1 TABLET BY MOUTH DAILY FOR 5 DAYS EVERY OTHER MONTH IF NO SPONTANEOUS MENSES 15 tablet 1  . potassium chloride SA (KLOR-CON M20) 20 MEQ tablet Take 1 tablet (20 mEq total) by mouth daily. 90 tablet 1  . spironolactone (ALDACTONE) 25 MG tablet Take 0.5 tablets (12.5 mg total) by mouth daily. 30 tablet 3  . Vitamin D, Cholecalciferol, 1000 units CAPS Take 1,000 Units by mouth every morning.     Marland Kitchen losartan (COZAAR) 25 MG tablet Take 1 tablet (25 mg total) by mouth at bedtime. 90 tablet 1   No facility-administered medications prior to visit.     ROS Review of Systems  Constitutional: Negative for diaphoresis, fatigue and unexpected weight change.  HENT: Negative.   Eyes: Negative for visual disturbance.  Respiratory: Negative for cough, chest tightness, shortness of  breath and wheezing.   Cardiovascular: Negative for chest pain, palpitations and leg swelling.  Gastrointestinal: Negative for abdominal pain, constipation, diarrhea, nausea and vomiting.  Genitourinary: Negative.  Negative for difficulty urinating.  Musculoskeletal: Negative.   Skin: Negative.   Allergic/Immunologic: Negative.   Neurological: Negative for dizziness, syncope, weakness, light-headedness and numbness.  Hematological: Does not bruise/bleed easily.  Psychiatric/Behavioral: Negative.     Objective:  BP 110/60 (BP Location: Left Arm, Patient Position: Sitting, Cuff Size: Normal)   Pulse (!) 58   Temp 98.3 F (36.8 C) (Oral)   Resp 16   Ht 5\' 7"  (1.702 m)   Wt 193 lb 12 oz (87.9 kg)   SpO2 98%   BMI 30.35 kg/m   BP Readings from Last 3 Encounters:  03/23/17 110/60  03/23/17 96/72  02/28/17 (!) 112/57    Wt Readings from Last 3 Encounters:  03/23/17 193 lb 12 oz (87.9 kg)  03/23/17 192 lb (87.1 kg)  02/28/17 189 lb 4.8 oz (85.9 kg)    Physical Exam  Constitutional: She is oriented to person, place, and time.  HENT:  Mouth/Throat: Oropharynx is clear and moist. No oropharyngeal exudate.  Eyes: Conjunctivae are normal. No scleral icterus.  Neck: Normal range of motion. Neck supple. No JVD present. No thyromegaly present.  Cardiovascular: Normal rate.  An irregularly irregular rhythm present.  Pulmonary/Chest: Effort normal and breath  sounds normal. No respiratory distress. She has no wheezes. She has no rales. She exhibits no tenderness.  Abdominal: Soft. Bowel sounds are normal. She exhibits no distension and no mass. There is no tenderness. There is no rebound and no guarding.  Musculoskeletal: Normal range of motion. She exhibits no edema, tenderness or deformity.  Lymphadenopathy:    She has no cervical adenopathy.  Neurological: She is alert and oriented to person, place, and time.  Skin: Skin is warm and dry. No rash noted. She is not diaphoretic. No  erythema. No pallor.  Vitals reviewed.   Lab Results  Component Value Date   WBC 46.4 (H) 02/26/2017   HGB 12.2 02/26/2017   HCT 38.8 02/26/2017   PLT 275 02/26/2017   GLUCOSE 99 02/27/2017   CHOL 182 02/27/2017   TRIG 135 02/27/2017   HDL 34 (L) 02/27/2017   LDLDIRECT 144.5 07/26/2013   LDLCALC 121 (H) 02/27/2017   ALT 59 (H) 02/27/2017   AST 44 (H) 02/27/2017   NA 138 02/27/2017   K 4.5 02/27/2017   CL 103 02/27/2017   CREATININE 1.06 (H) 02/27/2017   BUN 21 (H) 02/27/2017   CO2 25 02/27/2017   TSH 0.68 04/07/2016    No results found.  Assessment & Plan:   Anandi was seen today for hypertension.  Diagnoses and all orders for this visit:  Persistent atrial fibrillation ([place])- she has good rate control, she is anticoagulated, she has been asymptomatic for a week now, she will proceed with cardioversion next week.  Benign carcinoid tumors of the appendix, large intestine, and rectum -     Ambulatory referral to Gastroenterology   I have discontinued [name]'s losartan. I am also having her maintain her apixaban, medroxyPROGESTERone, Vitamin D (Cholecalciferol), furosemide, spironolactone, IMBRUVICA, carvedilol, and potassium chloride SA.  No orders of the defined types were placed in this encounter.    Follow-up: Return in about 6 months (around 09/22/2017).  Audrey Calico, MD

## 2017-03-24 ENCOUNTER — Telehealth: Payer: Self-pay | Admitting: *Deleted

## 2017-03-24 LAB — BASIC METABOLIC PANEL
BUN/Creatinine Ratio: 18 (ref 9–23)
BUN: 17 mg/dL (ref 6–24)
CO2: 21 mmol/L (ref 20–29)
Calcium: 9.9 mg/dL (ref 8.7–10.2)
Chloride: 103 mmol/L (ref 96–106)
Creatinine, Ser: 0.94 mg/dL (ref 0.57–1.00)
GFR calc Af Amer: 78 mL/min/{1.73_m2} (ref >59)
GFR calc non Af Amer: 68 mL/min/{1.73_m2} (ref >59)
Glucose: 89 mg/dL (ref 65–99)
Potassium: 4.7 mmol/L (ref 3.5–5.2)
Sodium: 141 mmol/L (ref 134–144)

## 2017-03-24 LAB — MAGNESIUM: Magnesium: 2 mg/dL (ref 1.6–2.3)

## 2017-03-24 LAB — CBC
Hematocrit: 37.5 % (ref 34.0–46.6)
Hemoglobin: 12 g/dL (ref 11.1–15.9)
MCH: 26.7 pg (ref 26.6–33.0)
MCHC: 32 g/dL (ref 31.5–35.7)
MCV: 83 fL (ref 79–97)
Platelets: 266 10*3/uL (ref 150–379)
RBC: 4.5 x10E6/uL (ref 3.77–5.28)
RDW: 15.5 % — ABNORMAL HIGH (ref 12.3–15.4)
WBC: 42.9 10*3/uL (ref 3.4–10.8)

## 2017-03-24 LAB — TSH: TSH: 0.519 u[IU]/mL (ref 0.450–4.500)

## 2017-03-24 NOTE — Telephone Encounter (Signed)
-----   Message from Erlene Quan, [place] sent at 03/24/2017 12:47 PM EDT ----- Let the pt know she can stop her potasium supplement altogether.   Kerin Ransom PA-C 03/24/2017 12:47 PM

## 2017-03-24 NOTE — Telephone Encounter (Signed)
Potassium supplement discontinued per provider instruction.

## 2017-03-25 ENCOUNTER — Ambulatory Visit: Payer: Self-pay | Admitting: Internal Medicine

## 2017-03-25 ENCOUNTER — Other Ambulatory Visit: Payer: Self-pay | Admitting: Nurse Practitioner

## 2017-03-25 NOTE — Assessment & Plan Note (Signed)
Her blood pressure is over controlled so I have recommended that she stop taking the ARB.

## 2017-03-27 ENCOUNTER — Telehealth: Payer: Self-pay | Admitting: Pharmacy Technician

## 2017-03-27 NOTE — Telephone Encounter (Signed)
Oral Oncology Patient Advocate Encounter  Received notification from [place] that prior authorization of Audrey Gross is required.  PA submitted on CoverMyMeds Key [id number]  Status is pending  Oral Oncology Clinic will continue to follow.   Fabio Asa. Melynda Keller, [facility] Patient Advocate [phone] 03/27/2017 1:37 PM

## 2017-03-30 NOTE — Telephone Encounter (Signed)
Prior Authorization issue for Audrey Gross has been resolved by [employer].    [place] alerted me that there were additional issues processing the claim for her medication that were associated with the drug's cost.  The pharmacy has contacted the insurance company.  There is an additional "override" that must be processed. This is expected to take 2 business days to review and approve.    Number for follow up [phone]  Reference number [id number].   Oral Oncology Clinic will continue to follow.   Fabio Asa. Melynda Keller, [facility] Patient Advocate [phone] 03/30/2017 9:29 AM

## 2017-03-31 NOTE — Telephone Encounter (Signed)
All insurance issues have been resolved.  Patient's co-pay should be $10 with the co-pay card that the pharmacy has on file.    Fabio Asa. Melynda Keller, [place] [facility] Patient Advocate [phone] 03/31/2017 2:08 PM

## 2017-04-02 ENCOUNTER — Encounter (HOSPITAL_COMMUNITY): Payer: Self-pay | Admitting: *Deleted

## 2017-04-02 ENCOUNTER — Ambulatory Visit (HOSPITAL_COMMUNITY): Payer: BLUE CROSS/BLUE SHIELD | Admitting: Certified Registered Nurse Anesthetist

## 2017-04-02 ENCOUNTER — Ambulatory Visit (HOSPITAL_COMMUNITY)
Admission: RE | Admit: 2017-04-02 | Discharge: 2017-04-02 | Disposition: A | Discharge status: Home / Self Care | Payer: BLUE CROSS/BLUE SHIELD | Source: Ambulatory Visit | Attending: Cardiology | Admitting: Cardiology

## 2017-04-02 ENCOUNTER — Encounter (HOSPITAL_COMMUNITY)
Admission: RE | Disposition: A | Discharge status: Home / Self Care | Payer: Self-pay | Source: Ambulatory Visit | Attending: Cardiology

## 2017-04-02 DIAGNOSIS — I110 Hypertensive heart disease with heart failure: Secondary | ICD-10-CM | POA: Insufficient documentation

## 2017-04-02 DIAGNOSIS — C9110 Chronic lymphocytic leukemia of B-cell type not having achieved remission: Secondary | ICD-10-CM | POA: Insufficient documentation

## 2017-04-02 DIAGNOSIS — Z79890 Hormone replacement therapy (postmenopausal): Secondary | ICD-10-CM | POA: Diagnosis not present

## 2017-04-02 DIAGNOSIS — E7800 Pure hypercholesterolemia, unspecified: Secondary | ICD-10-CM | POA: Diagnosis not present

## 2017-04-02 DIAGNOSIS — I340 Nonrheumatic mitral (valve) insufficiency: Secondary | ICD-10-CM | POA: Diagnosis not present

## 2017-04-02 DIAGNOSIS — D649 Anemia, unspecified: Secondary | ICD-10-CM | POA: Diagnosis not present

## 2017-04-02 DIAGNOSIS — Z6830 Body mass index (BMI) 30.0-30.9, adult: Secondary | ICD-10-CM | POA: Diagnosis not present

## 2017-04-02 DIAGNOSIS — I480 Paroxysmal atrial fibrillation: Secondary | ICD-10-CM | POA: Insufficient documentation

## 2017-04-02 DIAGNOSIS — E785 Hyperlipidemia, unspecified: Secondary | ICD-10-CM | POA: Diagnosis not present

## 2017-04-02 DIAGNOSIS — F329 Major depressive disorder, single episode, unspecified: Secondary | ICD-10-CM | POA: Insufficient documentation

## 2017-04-02 DIAGNOSIS — Z79899 Other long term (current) drug therapy: Secondary | ICD-10-CM | POA: Diagnosis not present

## 2017-04-02 DIAGNOSIS — I4891 Unspecified atrial fibrillation: Secondary | ICD-10-CM | POA: Diagnosis not present

## 2017-04-02 DIAGNOSIS — Z791 Long term (current) use of non-steroidal anti-inflammatories (NSAID): Secondary | ICD-10-CM | POA: Diagnosis not present

## 2017-04-02 DIAGNOSIS — Z9114 Patient's other noncompliance with medication regimen: Secondary | ICD-10-CM | POA: Diagnosis not present

## 2017-04-02 DIAGNOSIS — I5082 Biventricular heart failure: Secondary | ICD-10-CM | POA: Insufficient documentation

## 2017-04-02 HISTORY — PX: CARDIOVERSION: SHX1299

## 2017-04-02 LAB — POCT I-STAT 4, (NA,K, GLUC, HGB,HCT)
Glucose, Bld: 90 mg/dL (ref 65–99)
HCT: 35 % — ABNORMAL LOW (ref 36.0–46.0)
HEMOGLOBIN: 11.9 g/dL — AB (ref 12.0–15.0)
POTASSIUM: 3.7 mmol/L (ref 3.5–5.1)
Sodium: 143 mmol/L (ref 135–145)

## 2017-04-02 SURGERY — CARDIOVERSION
Anesthesia: General

## 2017-04-02 MED ORDER — SODIUM CHLORIDE 0.9 % IV SOLN
INTRAVENOUS | Status: DC
  Administered 2017-04-02: 12:00:00 via INTRAVENOUS

## 2017-04-02 MED ORDER — PROPOFOL 10 MG/ML IV BOLUS
INTRAVENOUS | Status: DC | PRN
  Administered 2017-04-02: 30 mg via INTRAVENOUS
  Administered 2017-04-02: 50 mg via INTRAVENOUS

## 2017-04-02 MED ORDER — LIDOCAINE 2% (20 MG/ML) 5 ML SYRINGE
INTRAMUSCULAR | Status: DC | PRN
  Administered 2017-04-02: 40 mg via INTRAVENOUS

## 2017-04-02 NOTE — Anesthesia Postprocedure Evaluation (Signed)
Anesthesia Post Note  Patient: Audrey Gross  Procedure(s) Performed: Procedure(s) (LRB): CARDIOVERSION (N/A)     Patient location during evaluation: PACU Anesthesia Type: General Level of consciousness: awake and alert Pain management: pain level controlled Vital Signs Assessment: post-procedure vital signs reviewed and stable Respiratory status: spontaneous breathing, nonlabored ventilation, respiratory function stable and patient connected to nasal cannula oxygen Cardiovascular status: blood pressure returned to baseline and stable Postop Assessment: no signs of nausea or vomiting Anesthetic complications: no    Last Vitals:  Vitals:   04/02/17 1230 04/02/17 1235  BP:  123/87  Pulse: 62 (!) 56  Resp: 16 14  Temp:      Last Pain:  Vitals:   04/02/17 1219  TempSrc: Oral                 Effie Berkshire

## 2017-04-02 NOTE — Anesthesia Preprocedure Evaluation (Addendum)
Anesthesia Evaluation  Patient identified by MRN, date of birth, ID band Patient awake    Reviewed: Allergy & Precautions, NPO status , Patient's Chart, lab work & pertinent test results, reviewed documented beta blocker date and time   Airway Mallampati: II   Neck ROM: Full    Dental  (+) Missing, Poor Dentition, Dental Advisory Given   Pulmonary    breath sounds clear to auscultation       Cardiovascular hypertension, Pt. on medications + Valvular Problems/Murmurs MR  Rhythm:Irregular Rate:Tachycardia     Neuro/Psych PSYCHIATRIC DISORDERS Depression    GI/Hepatic negative GI ROS, Neg liver ROS,   Endo/Other  Morbid obesity  Renal/GU negative Renal ROS     Musculoskeletal negative musculoskeletal ROS (+)   Abdominal (+) + obese,  Abdomen: soft. Bowel sounds: normal.  Peds  Hematology  (+) anemia ,   Anesthesia Other Findings   Reproductive/Obstetrics negative OB ROS                             BP Readings from Last 3 Encounters:  04/02/17 (!) 138/94  03/23/17 110/60  03/23/17 96/72   Lab Results  Component Value Date   WBC 42.9 (HH) 03/23/2017   HGB 11.9 (L) 04/02/2017   HCT 35.0 (L) 04/02/2017   MCV 83 03/23/2017   PLT 266 03/23/2017   No results found for: INR, PROTIME   Chemistry      Component Value Date/Time   NA 143 04/02/2017 1144   NA 141 03/23/2017 1015   NA 143 02/19/2017 1222   K 3.7 04/02/2017 1144   K 4.2 02/19/2017 1222   CL 103 03/23/2017 1015   CL 105 01/18/2013 0912   CO2 21 03/23/2017 1015   CO2 25 02/19/2017 1222   BUN 17 03/23/2017 1015   BUN 12.3 02/19/2017 1222   CREATININE 0.94 03/23/2017 1015   CREATININE 0.8 02/19/2017 1222      Component Value Date/Time   CALCIUM 9.9 03/23/2017 1015   CALCIUM 9.2 02/19/2017 1222   ALKPHOS 40 02/27/2017 0432   ALKPHOS 40 02/19/2017 1222   AST 44 (H) 02/27/2017 0432   AST 11 02/19/2017 1222   ALT 59 (H)  02/27/2017 0432   ALT 13 02/19/2017 1222   BILITOT 1.6 (H) 02/27/2017 0432   BILITOT 1.51 (H) 02/19/2017 1222      Anesthesia Physical Anesthesia Plan  ASA: III  Anesthesia Plan: General   Post-op Pain Management:    Induction: Intravenous  PONV Risk Score and Plan: Propofol  Airway Management Planned: Simple Face Mask  Additional Equipment:   Intra-op Plan:   Post-operative Plan:   Informed Consent: I have reviewed the patients History and Physical, chart, labs and discussed the procedure including the risks, benefits and alternatives for the proposed anesthesia with the patient or authorized representative who has indicated his/her understanding and acceptance.     Plan Discussed with: CRNA  Anesthesia Plan Comments:         Anesthesia Quick Evaluation

## 2017-04-02 NOTE — Transfer of Care (Signed)
Immediate Anesthesia Transfer of Care Note  Patient: Audrey Gross  Procedure(s) Performed: Procedure(s): CARDIOVERSION (N/A)  Patient Location: Endoscopy Unit  Anesthesia Type:MAC  Level of Consciousness: sedated and patient cooperative  Airway & Oxygen Therapy: Patient Spontanous Breathing and Patient connected to nasal cannula oxygen  Post-op Assessment: Report given to RN and Post -op Vital signs reviewed and stable  Post vital signs: Reviewed and stable  Last Vitals:  Vitals:   04/02/17 1212 04/02/17 1213  BP:    Pulse: (!) 45 67  Resp: (!) 26 (!) 33  Temp:      Last Pain:  Vitals:   04/02/17 1129  TempSrc: Oral         Complications: No apparent anesthesia complications

## 2017-04-02 NOTE — Interval H&P Note (Signed)
History and Physical Interval Note:  04/02/2017 12:06 PM  Audrey Gross  has presented today for surgery, with the diagnosis of AFIB  The various methods of treatment have been discussed with the patient and family. After consideration of risks, benefits and other options for treatment, the patient has consented to  Procedure(s): CARDIOVERSION (N/A) as a surgical intervention .  The patient's history has been reviewed, patient examined, no change in status, stable for surgery.  I have reviewed the patient's chart and labs.  Questions were answered to the patient's satisfaction.     Ena Dawley

## 2017-04-02 NOTE — H&P (View-Only) (Signed)
03/23/2017 Audrey Gross   05/30/1961  [id number]  Primary Physician Janith Lima, MD Primary Cardiologist: Dr Sallyanne Kuster  HPI:  56 y/o female with a history of HTN, HCVD, MR, and PAF, admitted 02/26/17 with DOE, uncontrolled HTN, AF, and acute on chronic mixed CHF. Her rate was controlled with beta blocker. She admitted she had not been completely compliant with her medications, including Eliquis. Her admission wgt was 193 lbs, discharge wgt  189 lbs. Today in the office she is 192 lbs. She denies any orthopnea or PND. She has no LE edema. She reports compliance with a ll her medications including Eliquis.    Current Outpatient Prescriptions  Medication Sig Dispense Refill  . apixaban (ELIQUIS) 5 MG TABS tablet Take 1 tablet (5 mg total) by mouth 2 (two) times daily. 60 tablet 11  . carvedilol (COREG) 6.25 MG tablet Take 1 tablet by mouth 2 (two) times daily.    . furosemide (LASIX) 40 MG tablet Take 1 tablet (40 mg total) by mouth daily. 30 tablet 6  . IMBRUVICA 140 MG capsul TAKE 3 CAPSULES BY MOUTH DAILY 90 capsule 3  . KLOR-CON M20 20 MEQ tablet TAKE 1 TABLET (20 MEQ TOTAL) BY MOUTH 2 (TWO) TIMES DAILY. 180 tablet 1  . losartan (COZAAR) 50 MG tablet Take 1 tablet (50 mg total) by mouth daily. 30 tablet 6  . medroxyPROGESTERone (PROVERA) 5 MG tablet TAKE 1 TABLET BY MOUTH DAILY FOR 5 DAYS EVERY OTHER MONTH IF NO SPONTANEOUS MENSES 15 tablet 1  . spironolactone (ALDACTONE) 25 MG tablet Take 0.5 tablets (12.5 mg total) by mouth daily. 30 tablet 3  . Vitamin D, Cholecalciferol, 1000 units CAPS Take 1,000 Units by mouth every morning.      No current facility-administered medications for this visit.     No Known Allergies  Past Medical History:  Diagnosis Date  . Atrial fibrillation ([place])   . CLL (chronic lymphoblastic leukemia)    Leukemia  . Cystic breast   . Depression   . Diffuse cystic mastopathy   . Dyslipidemia (high LDL; low HDL)   . Hypertension   . Iron deficiency  anemia, unspecified   . Lymphadenopathy of head and neck 04/05/2015  . Medical non-compliance   . Mitral regurgitation 02/2017   moderate to severe    Social History   Social History  . Marital status: Legally Separated    Spouse name: N/A  . Number of children: 6  . 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 Smoking status: Never Smoker  . Smokeless tobacco: Never Used  . Alcohol use No  . Drug use: No  . Sexual activity: Yes    Partners: Male    Birth control/ protection: Surgical   Other Topics Concern  . Not on file   Social History Narrative  . No narrative on file     Family History  Problem Relation Age of Onset  . Sudden death Mother   . Kidney disease Father        H/O HD and kidney transplant  . Stomach cancer Maternal Aunt   . Stomach cancer Paternal Grandmother   . Heart disease Brother   . Heart disease Maternal Grandmother      Review of Systems: General: negative for chills, fever, night sweats or weight changes.  Cardiovascular: negative for chest pain, dyspnea on exertion, edema, orthopnea, palpitations, paroxysmal nocturnal dyspnea or shortness  of breath Dermatological: negative for rash Respiratory: negative for cough or wheezing Urologic: negative for hematuria Abdominal: negative for nausea, vomiting, diarrhea, bright red blood per rectum, melena, or hematemesis Neurologic: negative for visual changes, syncope, or dizziness All other systems reviewed and are otherwise negative except as noted above.    Blood pressure 96/72, pulse (!) 56, height 5\' 7"  (1.702 m), weight 192 lb (87.1 kg).  General appearance: alert, cooperative, no distress and mildly obese Neck: no carotid bruit and no JVD Lungs: clear to auscultation bilaterally Heart: irregularly irregular rhythm Extremities: no edema Skin: Skin color, texture, turgor normal. No rashes or lesions Neurologic: Grossly normal  EKG  AF with VR 60, LAD  ASSESSMENT AND PLAN:   Acute on chronic diastolic (congestive) heart failure (HCC) Pt admitted with acute on chronic CHF 5/24-5/26/18  Atrial fibrillation with rapid ventricular response (HCC) Rate now slow-56  CLL (chronic lymphocytic leukemia) (HCC) [id number]  Hypertensive urgency HTN on admission 5/24-now borderline hypotensive  Mitral regurgitation Moderate to severe by echo May 2018  Chronic anticoagulation On Eliquis-CHA2DS2 VASc= 3   PLAN  We discussed OP DCCV and the importance of strict medication compliance. She did have some dizziness and weakness one day at work and her pressure and HR are running a little low- I cut her Cozaar back to 25 mg daily QHS and reduced her K+ supplement to 20 meq daily as she is on Aldactone. I will check pre DCCV labs including a TSH.   Kerin Ransom PA-C 03/23/2017 9:33 AM

## 2017-04-02 NOTE — Discharge Instructions (Signed)
Electrical Cardioversion, Care After °This sheet gives you information about how to care for yourself after your procedure. Your health care provider may also give you more specific instructions. If you have problems or questions, contact your health care provider. °What can I expect after the procedure? °After the procedure, it is common to have: °· Some redness on the skin where the shocks were given. ° °Follow these instructions at home: °· Do not drive for 24 hours if you were given a medicine to help you relax (sedative). °· Take over-the-counter and prescription medicines only as told by your health care provider. °· Ask your health care provider how to check your pulse. Check it often. °· Rest for 48 hours after the procedure or as told by your health care provider. °· Avoid or limit your caffeine use as told by your health care provider. °Contact a health care provider if: °· You feel like your heart is beating too quickly or your pulse is not regular. °· You have a serious muscle cramp that does not go away. °Get help right away if: °· You have discomfort in your chest. °· You are dizzy or you feel faint. °· You have trouble breathing or you are short of breath. °· Your speech is slurred. °· You have trouble moving an arm or leg on one side of your body. °· Your fingers or toes turn cold or blue. °This information is not intended to replace advice given to you by your health care provider. Make sure you discuss any questions you have with your health care provider. °Document Released: 07/13/2013 Document Revised: 04/25/2016 Document Reviewed: 03/28/2016 °Elsevier Interactive Patient Education © 2018 [employer] ° °

## 2017-04-02 NOTE — CV Procedure (Signed)
    Cardioversion Note  QIARA MINETTI [id number] 02/20/1961  Procedure: DC Cardioversion Indications: atrial fibrillation  Procedure Details Consent: Obtained Time Out: Verified patient identification, verified procedure, site/side was marked, verified correct patient position, special equipment/implants available, Radiology Safety Procedures followed,  medications/allergies/relevent history reviewed, required imaging and test results available.  Performed  The patient has been on adequate anticoagulation.  The patient received IV propofol administered by anesthesia staff for sedation.  Synchronous cardioversion was performed at 120 and 150 joules.  The cardioversion was successful.   Complications: No apparent complications Patient did tolerate procedure well.   Ena Dawley, MD, [facility] 04/02/2017, 12:16 PM

## 2017-04-02 NOTE — Anesthesia Procedure Notes (Signed)
Procedure Name: MAC Date/Time: 04/02/2017 12:00 PM Performed by: Mervyn Gay Pre-anesthesia Checklist: Patient identified, Patient being monitored, Timeout performed, Emergency Drugs available and Suction available Patient Re-evaluated:Patient Re-evaluated prior to inductionOxygen Delivery Method: Nasal cannula Number of attempts: 1 Placement Confirmation: positive ETCO2 Dental Injury: Teeth and Oropharynx as per pre-operative assessment

## 2017-04-05 ENCOUNTER — Encounter (HOSPITAL_COMMUNITY): Payer: Self-pay | Admitting: Cardiology

## 2017-04-06 MED FILL — IMBRUVICA 140 MG CAPSULE: 140 | 30 days supply | Qty: 90 | Fill #1

## 2017-04-07 ENCOUNTER — Encounter: Payer: Self-pay | Admitting: Cardiology

## 2017-04-07 ENCOUNTER — Ambulatory Visit (INDEPENDENT_AMBULATORY_CARE_PROVIDER_SITE_OTHER): Payer: BLUE CROSS/BLUE SHIELD | Admitting: Cardiology

## 2017-04-07 VITALS — BP 130/86 | HR 57 | Ht 67.0 in | Wt 197.8 lb

## 2017-04-07 DIAGNOSIS — I428 Other cardiomyopathies: Secondary | ICD-10-CM | POA: Diagnosis not present

## 2017-04-07 DIAGNOSIS — Z7901 Long term (current) use of anticoagulants: Secondary | ICD-10-CM

## 2017-04-07 DIAGNOSIS — C9110 Chronic lymphocytic leukemia of B-cell type not having achieved remission: Secondary | ICD-10-CM

## 2017-04-07 DIAGNOSIS — C9190 Lymphoid leukemia, unspecified not having achieved remission: Secondary | ICD-10-CM | POA: Diagnosis not present

## 2017-04-07 DIAGNOSIS — I480 Paroxysmal atrial fibrillation: Secondary | ICD-10-CM | POA: Diagnosis not present

## 2017-04-07 DIAGNOSIS — I340 Nonrheumatic mitral (valve) insufficiency: Secondary | ICD-10-CM | POA: Diagnosis not present

## 2017-04-07 NOTE — Progress Notes (Signed)
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 Her rate was controlled with beta blocker. She admitted she had not been completely compliant with her medications, including Eliquis. Her admission wgt was 193 lbs, discharge wgt  189 lbs. She was seen in follow up 03/23/17 and arrangements were made for OP DCCV 04/02/17 (successful). She is in the office today for follow up.   She tells me she still doesn't feel well. She vague DOE. After her CV she had a few episodes of brief tachycardia, less than a minute. She denies orthopnea but in general feel like she has more DOE than should be normal. She is not adding salt but admits she eats out a lot. Her wgt is up 7 lbs from her LOV and 12 lbs from her discharge wgt in May. There was some confusion about her medications and we reviewed these in detail. Dr Ronnald Ramp has stopped her Cozaar 25 mg after her LOV here.    Current Outpatient Prescriptions  Medication Sig Dispense Refill  . apixaban (ELIQUIS) 5 MG TABS tablet Take 1 tablet (5 mg total) by mouth 2 (two) times daily. 60 tablet 11  . carvedilol (COREG) 12.5 MG tablet Take 6.25 mg by mouth 2 (two) times daily.     . furosemide (LASIX) 40 MG tablet Take 1 tablet (40 mg total) by mouth daily. 30 tablet 6  . IMBRUVICA 140 MG capsul TAKE 3 CAPSULES BY MOUTH DAILY 90 capsule 3  . medroxyPROGESTERone (PROVERA) 5 MG tablet TAKE 1 TABLET BY MOUTH DAILY FOR 5 DAYS EVERY OTHER MONTH IF NO SPONTANEOUS MENSES 15 tablet 1  . spironolactone (ALDACTONE) 25 MG tablet Take 0.5 tablets (12.5 mg total) by mouth daily. 30 tablet 3  . Vitamin D, Cholecalciferol, 1000 units CAPS Take 1,000 Units by mouth every morning.      No current facility-administered medications for this visit.     No Known  Allergies  Past Medical History:  Diagnosis Date  . Atrial fibrillation ([place])   . CLL (chronic lymphoblastic leukemia)    Leukemia  . Cystic breast   . Depression   . Diffuse cystic mastopathy   . Dyslipidemia (high LDL; low HDL)   . Hypertension   . Iron deficiency anemia, unspecified   . Lymphadenopathy of head and neck 04/05/2015  . Medical non-compliance   . Mitral regurgitation 02/2017   moderate to severe    Social History   Social History  . Marital status: Legally Separated    Spouse name: N/A  . Number of children: 6  . 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 Smoking status: Never Smoker  . Smokeless tobacco: Never Used  . Alcohol use No  . Drug use: No  . Sexual activity: Yes    Partners: Male    Birth control/ protection: Surgical   Other Topics Concern  . Not on file   Social History Narrative  . No narrative on file     Family History  Problem Relation Age of Onset  . Sudden death Mother   . Kidney disease Father        H/O HD and kidney transplant  . Stomach cancer Maternal Aunt   .  Stomach cancer Paternal Grandmother   . Heart disease Brother   . Heart disease Maternal Grandmother      Review of Systems: General: negative for chills, fever, night sweats or weight changes.  Cardiovascular: negative for chest pain, dyspnea on exertion, edema, orthopnea, palpitations, paroxysmal nocturnal dyspnea or shortness of breath Dermatological: negative for rash Respiratory: negative for cough or wheezing Urologic: negative for hematuria Abdominal: negative for nausea, vomiting, diarrhea, bright red blood per rectum, melena, or hematemesis Neurologic: negative for visual changes, syncope, or dizziness All other systems reviewed and are otherwise negative except as noted above.    Blood pressure 130/86, pulse (!) 57, height 5\' 7"  (1.702 m), weight 197 lb 12.8 oz (89.7 kg), SpO2 99 %.    General appearance: alert, cooperative, no distress and moderately obese Neck: no carotid bruit and no JVD Lungs: clear to auscultation bilaterally Heart: regular rate and rhythm and 2/6 systolic murmur LSB, Lt axilary area Abdomen: obese non tender Extremities: extremities normal, atraumatic, no cyanosis or edema Skin: Skin color, texture, turgor normal. No rashes or lesions Neurologic: Grossly normal  EKG  NSR, SB-57, LVH, diffuse TWI-similar to her post DCCV EKG  ASSESSMENT AND PLAN:   PAF (paroxysmal atrial fibrillation) (HCC) OP DCCV 04/02/17  Chronic anticoagulation Eliquis- CHA2DS2 VASC=3  Mitral regurgitation Moderate to severe by echo May 2018 while in atrial fibrilation  Nonischemic cardiomyopathy ([place]) EF 30-35% when in AF May 2018  H/O CLL  PLAN  I suggested she increase her Lasix to 80 mg x 2 days, then resume 40 mg daily. F/U with Dr Sallyanne Kuster in 2 months after a repeat echo now that she is in NSR.   Kerin Ransom PA-C 04/07/2017 10:10 AM

## 2017-04-07 NOTE — Patient Instructions (Addendum)
Medication Instructions: INCREASE your furosemide to 80 mg daily ( two tablets) for two days then resume 40 mg daily   Procedures/Testing: Your physician has requested that you have an echocardiogram to be done in two months. Echocardiography is a painless test that uses sound waves to create images of your heart. It provides your doctor with information about the size and shape of your heart and how well your heart's chambers and valves are working. This procedure takes approximately one hour. There are no restrictions for this procedure. This will take place at [address]   Follow-Up: Your physician recommends that you schedule a follow-up appointment in: 2 months with Dr. Sallyanne Kuster   Any Additional Special Instructions Will Be Listed Below (If Applicable).  Restrict your salt intake.    If you need a refill on your cardiac medications before your next appointment, please call your pharmacy.

## 2017-04-07 NOTE — Assessment & Plan Note (Signed)
OP DCCV 04/02/17

## 2017-04-07 NOTE — Assessment & Plan Note (Signed)
Eliquis- CHA2DS2 VASC=3

## 2017-04-07 NOTE — Assessment & Plan Note (Signed)
Moderate to severe by echo May 2018 while in atrial fibrilation

## 2017-04-07 NOTE — Assessment & Plan Note (Signed)
EF 30-35% when in AF May 2018

## 2017-04-16 ENCOUNTER — Encounter: Payer: Self-pay | Admitting: Gastroenterology

## 2017-04-30 ENCOUNTER — Ambulatory Visit (INDEPENDENT_AMBULATORY_CARE_PROVIDER_SITE_OTHER): Payer: BLUE CROSS/BLUE SHIELD | Admitting: Family

## 2017-04-30 ENCOUNTER — Encounter: Payer: Self-pay | Admitting: Family

## 2017-04-30 VITALS — BP 152/94 | HR 106 | Temp 97.9°F | Resp 16 | Ht 67.0 in | Wt 196.1 lb

## 2017-04-30 DIAGNOSIS — H938X1 Other specified disorders of right ear: Secondary | ICD-10-CM | POA: Diagnosis not present

## 2017-04-30 DIAGNOSIS — H938X9 Other specified disorders of ear, unspecified ear: Secondary | ICD-10-CM | POA: Insufficient documentation

## 2017-04-30 DIAGNOSIS — R059 Cough, unspecified: Secondary | ICD-10-CM | POA: Insufficient documentation

## 2017-04-30 DIAGNOSIS — R05 Cough: Secondary | ICD-10-CM | POA: Diagnosis not present

## 2017-04-30 MED ORDER — AMOXICILLIN-POT CLAVULANATE 875-125 MG PO TABS: 1.0000 | ORAL_TABLET | Freq: Two times a day (BID) | ORAL | 0 refills | Status: DC

## 2017-04-30 NOTE — Assessment & Plan Note (Signed)
New-onset ear congestion/fullness consistent with eustachian tube dysfunction and congestion. Start Flonase and Coricidin HBP. Encourage plenty of fluids. Follow-up if symptoms worsen or do not improve.

## 2017-04-30 NOTE — Progress Notes (Signed)
Subjective:    Patient ID: Audrey Gross, female    DOB: 09/15/1961, 56 y.o.   MRN: [id number]  Chief Complaint  Patient presents with  . Ear Pain    right ear has been bothering her, states that she has had a lot of drainage and SOB     HPI:  Audrey Gross is a 56 y.o. female who  has a past medical history of Atrial fibrillation ([place]); CLL (chronic lymphoblastic leukemia); Cystic breast; Depression; Diffuse cystic mastopathy; Dyslipidemia (high LDL; low HDL); Hypertension; Iron deficiency anemia, unspecified; Lymphadenopathy of head and neck (04/05/2015); Medical non-compliance; and Mitral regurgitation (02/2017). and presents today for an office visit.  This is a new problem. Associated symptoms of right ear pain, drainage and shortness of breath have been going on for about 1 week. No fevers. Shortness of breath started this morning. Cough is described as dry. Modifying factors include Tylenol which did not help very much. Course of the symptoms appear to be staying about the same with no significant improvement.   No Known Allergies    Outpatient Medications Prior to Visit  Medication Sig Dispense Refill  . apixaban (ELIQUIS) 5 MG TABS tablet Take 1 tablet (5 mg total) by mouth 2 (two) times daily. 60 tablet 11  . carvedilol (COREG) 12.5 MG tablet Take 6.25 mg by mouth 2 (two) times daily.     . furosemide (LASIX) 40 MG tablet Take 1 tablet (40 mg total) by mouth daily. 30 tablet 6  . IMBRUVICA 140 MG capsul TAKE 3 CAPSULES BY MOUTH DAILY 90 capsule 3  . medroxyPROGESTERone (PROVERA) 5 MG tablet TAKE 1 TABLET BY MOUTH DAILY FOR 5 DAYS EVERY OTHER MONTH IF NO SPONTANEOUS MENSES 15 tablet 1  . spironolactone (ALDACTONE) 25 MG tablet Take 0.5 tablets (12.5 mg total) by mouth daily. 30 tablet 3  . Vitamin D, Cholecalciferol, 1000 units CAPS Take 1,000 Units by mouth every morning.      No facility-administered medications prior to visit.       Past Surgical History:  Procedure  Laterality Date  . CARDIOVERSION N/A 04/02/2017   Procedure: CARDIOVERSION;  Surgeon: Dorothy Spark, MD;  Location: [facility] ENDOSCOPY;  Service: Cardiovascular;  Laterality: N/A;  . TUBAL LIGATION        Past Medical History:  Diagnosis Date  . Atrial fibrillation ([place])   . CLL (chronic lymphoblastic leukemia)    Leukemia  . Cystic breast   . Depression   . Diffuse cystic mastopathy   . Dyslipidemia (high LDL; low HDL)   . Hypertension   . Iron deficiency anemia, unspecified   . Lymphadenopathy of head and neck 04/05/2015  . Medical non-compliance   . Mitral regurgitation 02/2017   moderate to severe      Review of Systems  Constitutional: Negative for chills and fever.  HENT: Positive for congestion, ear pain, sinus pressure and sore throat. Negative for ear discharge and sinus pain.   Respiratory: Positive for cough and shortness of breath. Negative for chest tightness and wheezing.   Neurological: Negative for headaches.  Psychiatric/Behavioral: Positive for sleep disturbance.      Objective:    BP (!) 152/94 (BP Location: Left Arm, Patient Position: Sitting, Cuff Size: Large)   Pulse (!) 106   Temp 97.9 F (36.6 C) (Oral)   Resp 16   Ht 5\' 7"  (1.702 m)   Wt 196 lb 1.9 oz (89 kg)   SpO2 95%   BMI 30.72 kg/m  Nursing note and vital signs reviewed.  Physical Exam  Constitutional: She is oriented to person, place, and time. She appears well-developed and well-nourished. No distress.  HENT:  Right Ear: Hearing, tympanic membrane, external ear and ear canal normal.  Left Ear: Hearing, tympanic membrane, external ear and ear canal normal.  Nose: Right sinus exhibits no maxillary sinus tenderness and no frontal sinus tenderness. Left sinus exhibits no maxillary sinus tenderness and no frontal sinus tenderness.  Mouth/Throat: Uvula is midline, oropharynx is clear and moist and mucous membranes are normal.  Cardiovascular: Normal rate, normal heart sounds and intact  distal pulses.  An irregularly irregular rhythm present.  Pulmonary/Chest: Effort normal and breath sounds normal.  Neurological: She is alert and oriented to person, place, and time.  Skin: Skin is warm and dry.  Psychiatric: She has a normal mood and affect. Her behavior is normal. Judgment and thought content normal.       Assessment & Plan:   Problem List Items Addressed This Visit      Other   Ear fullness    New-onset ear congestion/fullness consistent with eustachian tube dysfunction and congestion. Start Flonase and Coricidin HBP. Encourage plenty of fluids. Follow-up if symptoms worsen or do not improve.      Cough - Primary    Cough and congestion with concern for sinusitis. Start Augmentin. Continue over-the-counter medications as needed for symptom relief and supportive care. Follow-up if symptoms worsen or do not improve.          I am having Ms. Collett start on amoxicillin-clavulanate. I am also having her maintain her apixaban, medroxyPROGESTERone, Vitamin D (Cholecalciferol), furosemide, spironolactone, IMBRUVICA, and carvedilol.   Meds ordered this encounter  Medications  . amoxicillin-clavulanate (AUGMENTIN) 875-125 MG tablet    Sig: Take 1 tablet by mouth 2 (two) times daily.    Dispense:  14 tablet    Refill:  0    Order Specific Question:   Supervising Provider    Answer:   Pricilla Holm A [[id number]]     Follow-up: Return if symptoms worsen or fail to improve.  Mauricio Po, FNP

## 2017-04-30 NOTE — Assessment & Plan Note (Signed)
Cough and congestion with concern for sinusitis. Start Augmentin. Continue over-the-counter medications as needed for symptom relief and supportive care. Follow-up if symptoms worsen or do not improve.

## 2017-04-30 NOTE — Patient Instructions (Addendum)
Thank you for choosing [facility].  SUMMARY AND INSTRUCTIONS:  Start the Augmentin   Flonase for decongestion.  Unisom or Tylenol PM as needed for sleep.    Medication:  Your prescription(s) have been submitted to your pharmacy or been printed and provided for you. Please take as directed and contact our office if you believe you are having problem(s) with the medication(s) or have any questions.  Follow up:  If your symptoms worsen or fail to improve, please contact our office for further instruction, or in case of emergency go directly to the emergency room at the closest medical facility.   General Recommendations:    Please drink plenty of fluids.  Get plenty of rest   Sleep in humidified air  Use saline nasal sprays  Netti pot

## 2017-05-08 MED FILL — IMBRUVICA 140 MG CAPSULE: 140 | 30 days supply | Qty: 90 | Fill #2

## 2017-05-27 ENCOUNTER — Other Ambulatory Visit (HOSPITAL_BASED_OUTPATIENT_CLINIC_OR_DEPARTMENT_OTHER): Payer: BLUE CROSS/BLUE SHIELD

## 2017-05-27 ENCOUNTER — Encounter (HOSPITAL_COMMUNITY)
Admission: RE | Admit: 2017-05-27 | Discharge: 2017-05-27 | Disposition: A | Discharge status: Home / Self Care | Payer: BLUE CROSS/BLUE SHIELD | Source: Ambulatory Visit | Attending: Hematology and Oncology | Admitting: Hematology and Oncology

## 2017-05-27 ENCOUNTER — Telehealth (HOSPITAL_COMMUNITY): Payer: Self-pay | Admitting: Cardiology

## 2017-05-27 DIAGNOSIS — C9110 Chronic lymphocytic leukemia of B-cell type not having achieved remission: Secondary | ICD-10-CM | POA: Diagnosis not present

## 2017-05-27 DIAGNOSIS — C9190 Lymphoid leukemia, unspecified not having achieved remission: Secondary | ICD-10-CM | POA: Diagnosis not present

## 2017-05-27 LAB — COMPREHENSIVE METABOLIC PANEL
ALBUMIN: 3.7 g/dL (ref 3.5–5.0)
ALT: 18 U/L (ref 0–55)
AST: 18 U/L (ref 5–34)
Alkaline Phosphatase: 41 U/L (ref 40–150)
Anion Gap: 7 mEq/L (ref 3–11)
BILIRUBIN TOTAL: 1.71 mg/dL — AB (ref 0.20–1.20)
BUN: 13.9 mg/dL (ref 7.0–26.0)
CO2: 25 mEq/L (ref 22–29)
CREATININE: 0.9 mg/dL (ref 0.6–1.1)
Calcium: 9.3 mg/dL (ref 8.4–10.4)
Chloride: 110 mEq/L — ABNORMAL HIGH (ref 98–109)
EGFR: 88 mL/min/{1.73_m2} — AB (ref >90)
GLUCOSE: 92 mg/dL (ref 70–140)
Potassium: 3.7 mEq/L (ref 3.5–5.1)
SODIUM: 143 meq/L (ref 136–145)
TOTAL PROTEIN: 6.4 g/dL (ref 6.4–8.3)

## 2017-05-27 LAB — CBC WITH DIFFERENTIAL/PLATELET
BASO%: 0.3 % (ref 0.0–2.0)
Basophils Absolute: 0.1 10*3/uL (ref 0.0–0.1)
EOS ABS: 0.1 10*3/uL (ref 0.0–0.5)
EOS%: 0.1 % (ref 0.0–7.0)
HCT: 36.1 % (ref 34.8–46.6)
HEMOGLOBIN: 11.4 g/dL — AB (ref 11.6–15.9)
LYMPH%: 93.5 % — ABNORMAL HIGH (ref 14.0–49.7)
MCH: 27 pg (ref 25.1–34.0)
MCHC: 31.5 g/dL (ref 31.5–36.0)
MCV: 85.7 fL (ref 79.5–101.0)
MONO#: 0.3 10*3/uL (ref 0.1–0.9)
MONO%: 0.6 % (ref 0.0–14.0)
NEUT%: 5.5 % — ABNORMAL LOW (ref 38.4–76.8)
NEUTROS ABS: 2.5 10*3/uL (ref 1.5–6.5)
Platelets: 221 10*3/uL (ref 145–400)
RBC: 4.21 10*6/uL (ref 3.70–5.45)
RDW: 15.4 % — AB (ref 11.2–14.5)
WBC: 46.1 10*3/uL — AB (ref 3.9–10.3)
lymph#: 43.1 10*3/uL — ABNORMAL HIGH (ref 0.9–3.3)

## 2017-05-27 LAB — GLUCOSE, CAPILLARY: Glucose-Capillary: 86 mg/dL (ref 65–99)

## 2017-05-27 LAB — TECHNOLOGIST REVIEW

## 2017-05-27 MED ORDER — FLUDEOXYGLUCOSE F - 18 (FDG) INJECTION
9.7400 | Freq: Once | INTRAVENOUS | Status: AC | PRN
  Administered 2017-05-27: 9.74 via INTRAVENOUS

## 2017-05-27 NOTE — Telephone Encounter (Addendum)
Attempted to call patient to change her appt time for echo on 9/10, on both cell number and home number listed and neither of the numbers had voice mails.     User: [name] Date/time: 06/03/17 10:27 AM  Comment: Called pt to r/s appt time on 9/10 but was unable to lmsg Gross/c not VM was set up.   Context:  Outcome: Not Available  Phone number: [phone] Phone Type: Home Phone  Comm. type: Telephone Call type: Outgoing  Contact: Audrey Gross Relation to patient: Self    User: [name] Date/time: 05/29/17 3:47 PM  Comment: Called pt on both numbers to change appt time on 9/10 but was unable to leave a VM on either phone.   Context:  Outcome: Not Available  Phone number: [phone] Phone Type: Home Phone  Comm. type: Telephone Call type: Outgoing  Contact: Audrey Gross Relation to patient: Self    User: [name] Date/time: 05/27/17 10:48 AM  Comment: Called pt on both numbers to r/s appt time for echo on 9/10 and was unable to do so, due to no VM being set up on mobile number.   Context:  Outcome: Not Available  Phone number: [phone] Phone Type: Mobile  Comm. type: Telephone Call type: Outgoing  Contact: Audrey Gross Relation to patient: Self

## 2017-05-28 ENCOUNTER — Telehealth: Payer: Self-pay | Admitting: Hematology and Oncology

## 2017-05-28 ENCOUNTER — Ambulatory Visit (HOSPITAL_BASED_OUTPATIENT_CLINIC_OR_DEPARTMENT_OTHER): Payer: BLUE CROSS/BLUE SHIELD | Admitting: Hematology and Oncology

## 2017-05-28 VITALS — BP 150/102 | HR 89 | Temp 98.3°F | Resp 18 | Ht 67.0 in | Wt 194.2 lb

## 2017-05-28 DIAGNOSIS — Z7189 Other specified counseling: Secondary | ICD-10-CM

## 2017-05-28 DIAGNOSIS — I428 Other cardiomyopathies: Secondary | ICD-10-CM | POA: Diagnosis not present

## 2017-05-28 DIAGNOSIS — C9110 Chronic lymphocytic leukemia of B-cell type not having achieved remission: Secondary | ICD-10-CM

## 2017-05-28 MED ORDER — PREDNISONE 10 MG PO TABS: 10.0000 mg | ORAL_TABLET | Freq: Every day | ORAL | 0 refills | Status: DC

## 2017-05-28 NOTE — Progress Notes (Signed)
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

## 2017-05-28 NOTE — Telephone Encounter (Signed)
Gave avs and appointment for September. Due to other appointment conflicts on 6/24 treatment scheduled 9/11

## 2017-05-29 ENCOUNTER — Encounter: Payer: Self-pay | Admitting: Hematology and Oncology

## 2017-05-29 DIAGNOSIS — Z7189 Other specified counseling: Secondary | ICD-10-CM | POA: Insufficient documentation

## 2017-05-29 NOTE — Progress Notes (Signed)
[place] OFFICE PROGRESS NOTE  Patient Care Team: Janith Lima, MD as PCP - General (Internal Medicine)  SUMMARY OF ONCOLOGIC HISTORY:   CLL (chronic lymphocytic leukemia) ([place])   04/05/2015 Pathology Results    Accession: [id number] flow cytometry confirmed CLL. FISH was positive for p53 mutation      04/24/2015 Imaging    Extensive lymphadenopathy throughout the neck, chest (axilla), abdomen and pelvis, as detailed above, compatible with the reported clinical history of lymphoma. 2. Mild splenomegaly.      05/03/2015 - 08/27/2015 Chemotherapy    She started on Ibrutinib, discontinued prematurely when her prescription ran out      10/10/2015 - 05/29/2017 Chemotherapy    She was restarted back on Ibrutinib      11/13/2016 PET scan    Significant generalized reduction in size of numerous lymph nodes in the neck, chest, abdomen, and pelvis. Previously the activity of these nodes was low-level and in general a similar low-level activity is present today, significantly less than mediastinal blood pool activity, compatible with [place] 2. 2. Coronary atherosclerosis. Mild cardiomegaly. 3. Mildly prominent endometrium for age without accentuated metabolic activity in the endometrium. Consider pelvic sonography for further characterization.      05/27/2017 PET scan    1. Progressive hypermetabolic adenopathy, primarily involving cervical, axillary, pelvic and inguinal lymph nodes bilaterally, consistent with progressive lymphoma. 2. No solid visceral organ or osseous involvement.       INTERVAL HISTORY: Please see below for problem oriented charting. She returns for further follow-up She stated that she have noticed new progressive lymphadenopathy since she was last seen She had recent diagnosis of chronic congestive heart failure but denies recent chest pain or shortness of breath The patient denies any recent signs or symptoms of bleeding such as spontaneous  epistaxis, hematuria or hematochezia. Her appetite is stable and she denies recent weight loss  REVIEW OF SYSTEMS:   Constitutional: Denies fevers, chills or abnormal weight loss Eyes: Denies blurriness of vision Ears, nose, mouth, throat, and face: Denies mucositis or sore throat Respiratory: Denies cough, dyspnea or wheezes Cardiovascular: Denies palpitation, chest discomfort or lower extremity swelling Gastrointestinal:  Denies nausea, heartburn or change in bowel habits Skin: Denies abnormal skin rashes Lymphatics: Denies new lymphadenopathy or easy bruising Neurological:Denies numbness, tingling or new weaknesses Behavioral/Psych: Mood is stable, no new changes  All other systems were reviewed with the patient and are negative.  I have reviewed the past medical history, past surgical history, social history and family history with the patient and they are unchanged from previous note.  ALLERGIES:  has No Known Allergies.  MEDICATIONS:  Current Outpatient Prescriptions  Medication Sig Dispense Refill  . amoxicillin-clavulanate (AUGMENTIN) 875-125 MG tablet Take 1 tablet by mouth 2 (two) times daily. 14 tablet 0  . apixaban (ELIQUIS) 5 MG TABS tablet Take 1 tablet (5 mg total) by mouth 2 (two) times daily. 60 tablet 11  . carvedilol (COREG) 12.5 MG tablet Take 6.25 mg by mouth 2 (two) times daily.     . furosemide (LASIX) 40 MG tablet Take 1 tablet (40 mg total) by mouth daily. 30 tablet 6  . IMBRUVICA 140 MG capsul TAKE 3 CAPSULES BY MOUTH DAILY 90 capsule 3  . medroxyPROGESTERone (PROVERA) 5 MG tablet TAKE 1 TABLET BY MOUTH DAILY FOR 5 DAYS EVERY OTHER MONTH IF NO SPONTANEOUS MENSES 15 tablet 1  . predniSONE (DELTASONE) 10 MG tablet Take 1 tablet (10 mg total) by mouth daily with breakfast.  30 tablet 0  . spironolactone (ALDACTONE) 25 MG tablet Take 0.5 tablets (12.5 mg total) by mouth daily. 30 tablet 3  . Vitamin D, Cholecalciferol, 1000 units CAPS Take 1,000 Units by mouth  every morning.      No current facility-administered medications for this visit.     PHYSICAL EXAMINATION: ECOG PERFORMANCE STATUS: 2 - Symptomatic, <50% confined to bed  Vitals:   05/28/17 1322  BP: (!) 150/102  Pulse: 89  Resp: 18  Temp: 98.3 F (36.8 C)  SpO2: 100%   Filed Weights   05/28/17 1322  Weight: 194 lb 3.2 oz (88.1 kg)    GENERAL:alert, no distress and comfortable SKIN: skin color, texture, turgor are normal, no rashes or significant lesions EYES: normal, Conjunctiva are pink and non-injected, sclera clear Musculoskeletal:no cyanosis of digits and no clubbing  NEURO: alert & oriented x 3 with fluent speech, no focal motor/sensory deficits  LABORATORY DATA:  I have reviewed the data as listed    Component Value Date/Time   NA 143 05/27/2017 0931   K 3.7 05/27/2017 0931   CL 103 03/23/2017 1015   CL 105 01/18/2013 0912   CO2 25 05/27/2017 0931   GLUCOSE 92 05/27/2017 0931   GLUCOSE 83 01/18/2013 0912   BUN 13.9 05/27/2017 0931   CREATININE 0.9 05/27/2017 0931   CALCIUM 9.3 05/27/2017 0931   PROT 6.4 05/27/2017 0931   ALBUMIN 3.7 05/27/2017 0931   AST 18 05/27/2017 0931   ALT 18 05/27/2017 0931   ALKPHOS 41 05/27/2017 0931   BILITOT 1.71 (H) 05/27/2017 0931   GFRNONAA 68 03/23/2017 1015   GFRAA 78 03/23/2017 1015    No results found for: SPEP, UPEP  Lab Results  Component Value Date   WBC 46.1 (H) 05/27/2017   NEUTROABS 2.5 05/27/2017   HGB 11.4 (L) 05/27/2017   HCT 36.1 05/27/2017   MCV 85.7 05/27/2017   PLT 221 05/27/2017      Chemistry      Component Value Date/Time   NA 143 05/27/2017 0931   K 3.7 05/27/2017 0931   CL 103 03/23/2017 1015   CL 105 01/18/2013 0912   CO2 25 05/27/2017 0931   BUN 13.9 05/27/2017 0931   CREATININE 0.9 05/27/2017 0931      Component Value Date/Time   CALCIUM 9.3 05/27/2017 0931   ALKPHOS 41 05/27/2017 0931   AST 18 05/27/2017 0931   ALT 18 05/27/2017 0931   BILITOT 1.71 (H) 05/27/2017 0931        RADIOGRAPHIC STUDIES: I have personally reviewed the radiological images as listed and agreed with the findings in the report. Nm Pet Image Restag (ps) Skull Base To Thigh  Result Date: 05/27/2017 CLINICAL DATA:  Subsequent treatment strategy for chronic lymphocytic leukemia. EXAM: NUCLEAR MEDICINE PET SKULL BASE TO THIGH TECHNIQUE: 9.74 mCi F-18 FDG was injected intravenously. Full-ring PET imaging was performed from the skull base to thigh after the radiotracer. CT data was obtained and used for attenuation correction and anatomic localization. FASTING BLOOD GLUCOSE:  Value: 86 mg/dl COMPARISON:  PET-CT 11/13/2016 and 04/24/2015. FINDINGS: NECK Interval enlargement of numerous cervical lymph nodes bilaterally. These lymph nodes demonstrate low-level hypermetabolic activity with an SUV max measuring up to 4.7 on the right.There are no lesions of the pharyngeal mucosal space. CHEST Progressive enlargement of multiple axillary lymph nodes bilaterally, demonstrating low level hypermetabolic activity. Largest right axillary node measures 18 mm on image 49 (previously 10 mm). This has an SUV max of 4.1 (  previously 2.3). There is an 18 mm left axillary node on image 42. There is no hypermetabolic mediastinal or hilar nodal activity. At the site of the previously demonstrated right paratracheal node, there is an enlarging nodule which measures water density, probably a treated node. There is no suspicious pulmonary activity. The lungs are clear. Cardiomegaly and coronary artery atherosclerosis again noted. ABDOMEN/PELVIS There is no hypermetabolic activity within the liver, adrenal glands, spleen or pancreas. The spleen size and activity of remain within normal limits. There has been interval enlargement of multiple small retroperitoneal, mesenteric and pelvic lymph nodes bilaterally. Most of the intra- abdominal nodes demonstrate activity similar to blood pool. There are external iliac nodes on the right  which are mildly hypermetabolic (SUV max 5.0). There are mildly enlarged and hypermetabolic inguinal lymph nodes bilaterally. An index left inguinal node measures 15 mm on image 174 and has an SUV max of 4.4. SKELETON There is no hypermetabolic activity to suggest osseous metastatic disease. IMPRESSION: 1. Progressive hypermetabolic adenopathy, primarily involving cervical, axillary, pelvic and inguinal lymph nodes bilaterally, consistent with progressive lymphoma. 2. No solid visceral organ or osseous involvement. Electronically Signed   By: Richardean Sale M.D.   On: 05/27/2017 14:13    ASSESSMENT & PLAN:  CLL (chronic lymphocytic leukemia) ([place]) She has high risk disease She has not been able to achieve remission despite being on chemotherapy for 2 years I recommend discontinuation of ibrutinib In preparation for systemic treatment, I recommend port placement and chemo education class I will leave her on low-dose prednisone in the meantime to control her disease I will see her back again in 2 weeks for chemotherapy consent We discussed briefly treatment options The patient is very distressed with the prospect of pursuing chemotherapy treatment I offered to meet her and her husband again in 2 weeks for further discussions about plan of care Given her inability to cope with potential difficult side effects such as nausea, vomiting and others, and recent diagnosis of CHF, I recommend compromise treatment option with bendamustine and rituximab  Nonischemic cardiomyopathy ([place]) She developed congestive heart failure and appears to be symptomatic She is frail She will continue medication management and anticoagulation therapy as directed by her cardiologist She needs to interrupt anticoagulation therapy for 48 hours before port placement   Goals of care, counseling/discussion The patient is aware she has incurable disease and treatment is strictly palliative. Even though this disease is not  curable but is highly treatable The patient started to cry and is very distressed at the prospect of going into chemotherapy treatment I will discuss further plan of care with her and her husband in our next meeting I recommend she stop working in the future to focus on treatment   Orders Placed This Encounter  Procedures  . IR FLUORO GUIDE PORT INSERTION RIGHT    Patient on Eliquis, will need advanced notice    Standing Status:   Future    Standing Expiration Date:   07/28/2018    Order Specific Question:   Reason for Exam (SYMPTOM  OR DIAGNOSIS REQUIRED)    Answer:   need port for chemo    Order Specific Question:   Preferred Imaging Location?    Answer:   [facility]  . Comprehensive metabolic panel    Standing Status:   Standing    Number of Occurrences:   22    Standing Expiration Date:   05/29/2018  . CBC with Differential/Platelet    Standing Status:  Standing    Number of Occurrences:   22    Standing Expiration Date:   05/29/2018  . Hepatitis B core antibody, IgM    Standing Status:   Future    Standing Expiration Date:   07/03/2018  . Hepatitis B surface antibody    Standing Status:   Future    Standing Expiration Date:   07/03/2018  . Hepatitis B surface antigen    Standing Status:   Future    Standing Expiration Date:   07/03/2018  . Lactate dehydrogenase    Standing Status:   Future    Standing Expiration Date:   07/03/2018  . Uric acid    Standing Status:   Future    Standing Expiration Date:   07/03/2018   All questions were answered. The patient knows to call the clinic with any problems, questions or concerns. No barriers to learning was detected. I spent 30 minutes counseling the patient face to face. The total time spent in the appointment was 55 minutes and more than 50% was on counseling and review of test results     Heath Lark, MD 05/29/2017 8:42 AM

## 2017-05-29 NOTE — Assessment & Plan Note (Signed)
The patient is aware she has incurable disease and treatment is strictly palliative. Even though this disease is not curable but is highly treatable The patient started to cry and is very distressed at the prospect of going into chemotherapy treatment I will discuss further plan of care with her and her husband in our next meeting I recommend she stop working in the future to focus on treatment

## 2017-05-29 NOTE — Assessment & Plan Note (Signed)
She has high risk disease She has not been able to achieve remission despite being on chemotherapy for 2 years I recommend discontinuation of ibrutinib In preparation for systemic treatment, I recommend port placement and chemo education class I will leave her on low-dose prednisone in the meantime to control her disease I will see her back again in 2 weeks for chemotherapy consent We discussed briefly treatment options The patient is very distressed with the prospect of pursuing chemotherapy treatment I offered to meet her and her husband again in 2 weeks for further discussions about plan of care Given her inability to cope with potential difficult side effects such as nausea, vomiting and others, and recent diagnosis of CHF, I recommend compromise treatment option with bendamustine and rituximab

## 2017-05-29 NOTE — Assessment & Plan Note (Signed)
She developed congestive heart failure and appears to be symptomatic She is frail She will continue medication management and anticoagulation therapy as directed by her cardiologist She needs to interrupt anticoagulation therapy for 48 hours before port placement

## 2017-06-06 ENCOUNTER — Other Ambulatory Visit: Payer: Self-pay | Admitting: Nurse Practitioner

## 2017-06-10 ENCOUNTER — Encounter: Payer: Self-pay | Admitting: Gastroenterology

## 2017-06-10 ENCOUNTER — Ambulatory Visit (INDEPENDENT_AMBULATORY_CARE_PROVIDER_SITE_OTHER): Payer: BLUE CROSS/BLUE SHIELD | Admitting: Gastroenterology

## 2017-06-10 VITALS — Ht 67.0 in | Wt 189.4 lb

## 2017-06-10 DIAGNOSIS — R1310 Dysphagia, unspecified: Secondary | ICD-10-CM

## 2017-06-10 NOTE — Patient Instructions (Addendum)
You do not have any records of carcinoid. These words were added 01/2012 by medical oncology but I cannot find any documentation to support.  You have never had carcinoid that I can find from records review.  Barium esophagram for dysphagia.  You have been scheduled for a Barium Esophogram at [facility] (1st floor of the hospital) on 06/23/17 at 930 am. Please arrive 15 minutes prior to your appointment for registration. Make certain not to have anything to eat or drink 6 hours prior to your test. If you need to reschedule for any reason, please contact radiology at [phone] to do so. __________________________________________________________________ A barium swallow is an examination that concentrates on views of the esophagus. This tends to be a double contrast exam (barium and two liquids which, when combined, create a gas to distend the wall of the oesophagus) or single contrast (non-ionic iodine based). The study is usually tailored to your symptoms so a good history is essential. Attention is paid during the study to the form, structure and configuration of the esophagus, looking for functional disorders (such as aspiration, dysphagia, achalasia, motility and reflux) EXAMINATION You may be asked to change into a gown, depending on the type of swallow being performed. A radiologist and radiographer will perform the procedure. The radiologist will advise you of the type of contrast selected for your procedure and direct you during the exam. You will be asked to stand, sit or lie in several different positions and to hold a small amount of fluid in your mouth before being asked to swallow while the imaging is performed .In some instances you may be asked to swallow barium coated marshmallows to assess the motility of a solid food bolus. The exam can be recorded as a digital or video fluoroscopy procedure. POST PROCEDURE It will take 1-2 days for the barium to pass through your system. To  facilitate this, it is important, unless otherwise directed, to increase your fluids for the next 24-48hrs and to resume your normal diet.  This test typically takes about 30 minutes to perform. __________________________________________________________________________________  Normal BMI (Body Mass Index- based on height and weight) is between 19 and 25. Your BMI today is Body mass index is 29.66 kg/m. Marland Kitchen Please consider follow up  regarding your BMI with your Primary Care Provider.

## 2017-06-10 NOTE — Progress Notes (Signed)
Review of pertinent gastrointestinal problems: 1.  Routine risk for colon cancer, colonoscopy 2013 for iron deficiency anemia was normal. It was felt her anemia was likely from heavy menstrual bleeding.    HPI: This is a  pleasant 56 year old woman  who was referred to me by Janith Lima, MD  to evaluate  "history of carcinoid" written in her chart  .    Chief complaint is history of carcinoid   She has been looking through her electronic charting. She brought out her phone to show me. There is a note about carcinoid tumor that was entered in her chart April 2013. I reviewed the Epic system and it looks like those words were entered by her medical oncologist in 2013. That was when she was being treated for CLL. There are no office notes around that time by her oncologist stating that she has carcinoid. Indeed I can find no documentation anywhere in her chart to support that diagnosis and I believe it was simply entered in error.   Rough in her throat.  Makes it hard to swallow. This was around the time of an ear infection.  Can be hoarse at times.  She thinks she's lost some weight. But she cannot be clear how much.   Old Data Reviewed: PET scan 05/2017: 1. Progressive hypermetabolic adenopathy, primarily involving cervical, axillary, pelvic and inguinal lymph nodes bilaterally, consistent with progressive lymphoma. 2. No solid visceral organ or osseous involvement.    Review of systems: Pertinent positive and negative review of systems were noted in the above HPI section. All other review negative.   Past Medical History:  Diagnosis Date  . Atrial fibrillation ([place])   . CLL (chronic lymphoblastic leukemia)    Leukemia  . Cystic breast   . Depression   . Diffuse cystic mastopathy   . Dyslipidemia (high LDL; low HDL)   . Hypertension   . Iron deficiency anemia, unspecified   . Lymphadenopathy of head and neck 04/05/2015  . Medical non-compliance   . Mitral regurgitation  02/2017   moderate to severe    Past Surgical History:  Procedure Laterality Date  . CARDIOVERSION N/A 04/02/2017   Procedure: CARDIOVERSION;  Surgeon: Dorothy Spark, MD;  Location: [facility] ENDOSCOPY;  Service: Cardiovascular;  Laterality: N/A;  . TUBAL LIGATION      Current Outpatient Prescriptions  Medication Sig Dispense Refill  . apixaban (ELIQUIS) 5 MG TABS tablet Take 1 tablet (5 mg total) by mouth 2 (two) times daily. 60 tablet 11  . carvedilol (COREG) 12.5 MG tablet Take 6.25 mg by mouth 2 (two) times daily.     . furosemide (LASIX) 40 MG tablet Take 1 tablet (40 mg total) by mouth daily. 30 tablet 6  . medroxyPROGESTERone (PROVERA) 5 MG tablet TAKE 1 TABLET BY MOUTH DAILY FOR 5 DAYS EVERY OTHER MONTH IF NO SPONTANEOUS MENSES 15 tablet 1  . predniSONE (DELTASONE) 10 MG tablet Take 1 tablet (10 mg total) by mouth daily with breakfast. 30 tablet 0  . spironolactone (ALDACTONE) 25 MG tablet Take 0.5 tablets (12.5 mg total) by mouth daily. 30 tablet 3  . Vitamin D, Cholecalciferol, 1000 units CAPS Take 1,000 Units by mouth every morning.      No current facility-administered medications for this visit.     Allergies as of 06/10/2017  . (No Known Allergies)    Family History  Problem Relation Age of Onset  . Sudden death Mother   . Kidney disease Father  H/O HD and kidney transplant  . Stomach cancer Maternal Aunt   . Stomach cancer Paternal Grandmother   . Heart disease Brother   . Heart disease Maternal Grandmother     Social History   Social History  . Marital status: Legally Separated    Spouse name: N/A  . Number of children: 6  . 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 Smoking status: Never Smoker  . Smokeless tobacco: Never Used  . Alcohol use No  . Drug use: No  . Sexual activity: Yes    Partners: Male    Birth control/ protection: Surgical   Other Topics Concern  . Not on file    Social History Narrative  . No narrative on file     Physical Exam: Ht 5\' 7"  (1.702 m)   Wt 189 lb 6.4 oz (85.9 kg)   BMI 29.66 kg/m  Constitutional: generally well-appearing Psychiatric: alert and oriented x3 Eyes: extraocular movements intact Mouth: oral pharynx moist, no lesions Neck: supple no lymphadenopathy Cardiovascular: heart regular rate and rhythm Lungs: clear to auscultation bilaterally Abdomen: soft, nontender, nondistended, no obvious ascites, no peritoneal signs, normal bowel sounds Extremities: no lower extremity edema bilaterally Skin: no lesions on visible extremities   Assessment and plan: 56 y.o. female with  Concern for "carcinoid tumor" since it appeared in her electronic personal record my chart. Also mild dysphasia  She has been looking through her electronic charting. She brought out her phone to show me. There is a note about carcinoid tumor that was entered in her chart April 2013. I reviewed the Epic system and it looks like those words were entered by her medical oncologist in 2013. That was when she was being treated for CLL. There are no office notes around that time by her oncologist stating that she has carcinoid. Indeed I can find no documentation anywhere in her chart to support that diagnosis and I believe it was simply entered in error.  I explained that to her and her husband but I'm not sure that they understood what I was trying to say that this was simply a clerical error on the part of her medical oncologist around 2013 and that indeed she does not have carcinoid. She asked if she should have a colonoscopy sooner and I explained that she does not.  She does have some difficulty swallowing at times. She has no obvious thrush but she certainly at risk for that given her chemotherapy, intermittent steroids for her CLL. I recommended a barium esophagram.   Please see the "Patient Instructions" section for addition details about the  plan.   Owens Loffler, MD [facility] 06/10/2017, 10:21 AM  Cc: Janith Lima, MD

## 2017-06-11 ENCOUNTER — Other Ambulatory Visit: Payer: BLUE CROSS/BLUE SHIELD

## 2017-06-11 ENCOUNTER — Other Ambulatory Visit: Payer: Self-pay | Admitting: Hematology and Oncology

## 2017-06-11 ENCOUNTER — Telehealth: Payer: Self-pay | Admitting: Hematology and Oncology

## 2017-06-11 ENCOUNTER — Other Ambulatory Visit (HOSPITAL_BASED_OUTPATIENT_CLINIC_OR_DEPARTMENT_OTHER): Payer: BLUE CROSS/BLUE SHIELD

## 2017-06-11 ENCOUNTER — Encounter: Payer: Self-pay | Admitting: *Deleted

## 2017-06-11 ENCOUNTER — Ambulatory Visit (HOSPITAL_BASED_OUTPATIENT_CLINIC_OR_DEPARTMENT_OTHER): Payer: BLUE CROSS/BLUE SHIELD | Admitting: Hematology and Oncology

## 2017-06-11 VITALS — BP 110/73 | HR 71 | Temp 97.9°F | Resp 20 | Ht 67.0 in | Wt 188.7 lb

## 2017-06-11 DIAGNOSIS — C9110 Chronic lymphocytic leukemia of B-cell type not having achieved remission: Secondary | ICD-10-CM

## 2017-06-11 DIAGNOSIS — Z7189 Other specified counseling: Secondary | ICD-10-CM

## 2017-06-11 DIAGNOSIS — E790 Hyperuricemia without signs of inflammatory arthritis and tophaceous disease: Secondary | ICD-10-CM | POA: Diagnosis not present

## 2017-06-11 DIAGNOSIS — I428 Other cardiomyopathies: Secondary | ICD-10-CM

## 2017-06-11 LAB — CBC WITH DIFFERENTIAL/PLATELET
BASO%: 0.7 % (ref 0.0–2.0)
Basophils Absolute: 0.7 10*3/uL — ABNORMAL HIGH (ref 0.0–0.1)
EOS%: 0.4 % (ref 0.0–7.0)
Eosinophils Absolute: 0.4 10*3/uL (ref 0.0–0.5)
HEMATOCRIT: 41.2 % (ref 34.8–46.6)
HGB: 12.7 g/dL (ref 11.6–15.9)
LYMPH%: 88.1 % — ABNORMAL HIGH (ref 14.0–49.7)
MCH: 27.1 pg (ref 25.1–34.0)
MCHC: 30.9 g/dL — AB (ref 31.5–36.0)
MCV: 87.7 fL (ref 79.5–101.0)
MONO#: 1.4 10*3/uL — AB (ref 0.1–0.9)
MONO%: 1.2 % (ref 0.0–14.0)
NEUT%: 9.6 % — AB (ref 38.4–76.8)
NEUTROS ABS: 10.6 10*3/uL — AB (ref 1.5–6.5)
PLATELETS: 259 10*3/uL (ref 145–400)
RBC: 4.7 10*6/uL (ref 3.70–5.45)
RDW: 15.7 % — AB (ref 11.2–14.5)
WBC: 110.4 10*3/uL — AB (ref 3.9–10.3)
lymph#: 97.3 10*3/uL — ABNORMAL HIGH (ref 0.9–3.3)

## 2017-06-11 LAB — COMPREHENSIVE METABOLIC PANEL
ALBUMIN: 4.1 g/dL (ref 3.5–5.0)
ALK PHOS: 49 U/L (ref 40–150)
ALT: 11 U/L (ref 0–55)
ANION GAP: 11 meq/L (ref 3–11)
AST: 16 U/L (ref 5–34)
BILIRUBIN TOTAL: 1.43 mg/dL — AB (ref 0.20–1.20)
BUN: 16.8 mg/dL (ref 7.0–26.0)
CALCIUM: 9.7 mg/dL (ref 8.4–10.4)
CO2: 22 mEq/L (ref 22–29)
Chloride: 109 mEq/L (ref 98–109)
Creatinine: 1 mg/dL (ref 0.6–1.1)
EGFR: 70 mL/min/{1.73_m2} — AB (ref >90)
Glucose: 93 mg/dl (ref 70–140)
POTASSIUM: 3.9 meq/L (ref 3.5–5.1)
Sodium: 142 mEq/L (ref 136–145)
TOTAL PROTEIN: 7.1 g/dL (ref 6.4–8.3)

## 2017-06-11 LAB — LACTATE DEHYDROGENASE: LDH: 292 U/L — ABNORMAL HIGH (ref 125–245)

## 2017-06-11 LAB — TECHNOLOGIST REVIEW

## 2017-06-11 LAB — URIC ACID: URIC ACID, SERUM: 11.1 mg/dL — AB (ref 2.6–7.4)

## 2017-06-11 MED ORDER — PROCHLORPERAZINE MALEATE 10 MG PO TABS: 10.0000 mg | ORAL_TABLET | Freq: Four times a day (QID) | ORAL | 1 refills | Status: DC | PRN

## 2017-06-11 MED ORDER — ONDANSETRON HCL 8 MG PO TABS: 8.0000 mg | ORAL_TABLET | Freq: Three times a day (TID) | ORAL | 1 refills | Status: DC | PRN

## 2017-06-11 MED ORDER — PREDNISONE 50 MG PO TABS: ORAL_TABLET | ORAL | 0 refills | Status: DC

## 2017-06-11 MED ORDER — ALLOPURINOL 300 MG PO TABS: 300.0000 mg | ORAL_TABLET | Freq: Every day | ORAL | 3 refills | Status: DC

## 2017-06-11 MED ORDER — ACYCLOVIR 400 MG PO TABS: 400.0000 mg | ORAL_TABLET | Freq: Every day | ORAL | 3 refills | Status: DC

## 2017-06-11 NOTE — Telephone Encounter (Signed)
Gave patient avs and calendar with upcoming appts.  °

## 2017-06-12 ENCOUNTER — Encounter: Payer: Self-pay | Admitting: Hematology and Oncology

## 2017-06-12 DIAGNOSIS — E790 Hyperuricemia without signs of inflammatory arthritis and tophaceous disease: Secondary | ICD-10-CM | POA: Insufficient documentation

## 2017-06-12 LAB — HEPATITIS B SURFACE ANTIBODY,QUALITATIVE: Hep B Surface Ab, Qual: REACTIVE

## 2017-06-12 LAB — HEPATITIS B CORE ANTIBODY, IGM: Hep B Core Ab, IgM: NEGATIVE

## 2017-06-12 LAB — HEPATITIS B SURFACE ANTIGEN: HEP B S AG: NEGATIVE

## 2017-06-12 NOTE — Assessment & Plan Note (Addendum)
The patient has high risk disease She started crying whenever I mention chemotherapy She is avoiding port placement for many reasons Her husband is present We discussed the risks, benefits, side effects and alternative of treatment She is distressed over the prospect of possibly losing her hair For this reason, I am avoiding FCR I recommend a trial of bendamustine and rituximab I will give significant dose adjustment to rituximab for cycle 1 given anticipated high risk of infusion reaction I recommend increasing prednisone to 60 mg daily I have informed pharmacy to get rasburicase ready She is started on allopurinol  We discussed the role of chemotherapy is of palliative intent The decision was made based on publication in the Blood: Randomized trial of bendamustine-rituximab or R-CHOP/R-CVP in first-line treatment of indolent NHL or MCL: the BRIGHT study.  [place] [address], Lucianne Lei der [address], [place] BS, [address] M, Kwan YL, Simpson D, Craig M, Kolibaba K, Issa S, [place], [place] DM, Munteanu M, Aram Beecham JM SO  Blood. 2014;123(19):2944.  The chemotherapy consists of   1. Bendamustine at 90 mg/m2 on day 1 & 2 2. Rituximab at 375 mg/m2 on day 1 Each cycle + 28 days  Plan for 6 cycles total with or without Neulasta support  In the international phase III BRIGHT trial, 447 previously untreated patients with advanced stage follicular (n = 263 patients), mantle cell (n = 74 patients), or other indolent lymphoma were randomly assigned to six cycles of BR according to the same dose and schedule described above or to R-CHOP or R-CVP. BR resulted in similar complete (31 versus 25 percent) and overall (97 versus 91 percent) response rates. BR was associated with higher rates of vomiting and drug hypersensitivity and lower rates of peripheral neuropathy/paresthesia and alopecia. The use of prophylactic antiemetics was not specified in the protocol and was more common  among patients assigned to R-CHOP.   We discussed some of the risks, benefits and side-effects of Rituximab with Bendamustine.   Some of the short term side-effects included, though not limited to, risk of fatigue, weight loss, tumor lysis syndrome, risk of allergic reactions, pancytopenia, life-threatening infections, need for transfusions of blood products, nausea, vomiting, change in bowel habits, admission to hospital for various reasons, and risks of death.   Long term side-effects are also discussed including permanent damage to nerve function, chronic fatigue, and rare secondary malignancy including bone marrow disorders.   The patient is aware that the response rates discussed earlier is not guaranteed.    After a long discussion, patient made an informed decision to proceed with the prescribed plan of care.   Patient education material was dispensed As above, I plan to reduce a dose of rituximab for cycle 1 Given her young age, I will not prescribe G-CSF upfront She is started on acyclovir for antimicrobial prophylaxis.

## 2017-06-12 NOTE — Assessment & Plan Note (Signed)
She is at high risk for tumor lysis syndrome I recommend she stop Lasix She is recommend increase oral fluid hydration She is on allopurinol and I am going to give her 1 dose of rasburicase I will see her back within the week of treatment for toxicity review

## 2017-06-12 NOTE — Assessment & Plan Note (Signed)
The patient is aware she has incurable disease and treatment is strictly palliative. Even though this disease is not curable but is highly treatable I recommend she stop working in the future to focus on treatment She has brought some FMLA paperwork and I will help get her disability

## 2017-06-12 NOTE — Progress Notes (Signed)
[facility] OFFICE PROGRESS NOTE  Patient Care Team: Janith Lima, MD as PCP - General (Internal Medicine)  SUMMARY OF ONCOLOGIC HISTORY:   CLL (chronic lymphocytic leukemia) ([place])   04/05/2015 Pathology Results    Accession: [id number] flow cytometry confirmed CLL. FISH was positive for p53 mutation      04/24/2015 Imaging    Extensive lymphadenopathy throughout the neck, chest (axilla), abdomen and pelvis, as detailed above, compatible with the reported clinical history of lymphoma. 2. Mild splenomegaly.      05/03/2015 - 08/27/2015 Chemotherapy    She started on Ibrutinib, discontinued prematurely when her prescription ran out      10/10/2015 - 05/29/2017 Chemotherapy    She was restarted back on Ibrutinib      11/13/2016 PET scan    Significant generalized reduction in size of numerous lymph nodes in the neck, chest, abdomen, and pelvis. Previously the activity of these nodes was low-level and in general a similar low-level activity is present today, significantly less than mediastinal blood pool activity, compatible with [place] 2. 2. Coronary atherosclerosis. Mild cardiomegaly. 3. Mildly prominent endometrium for age without accentuated metabolic activity in the endometrium. Consider pelvic sonography for further characterization.      05/27/2017 PET scan    1. Progressive hypermetabolic adenopathy, primarily involving cervical, axillary, pelvic and inguinal lymph nodes bilaterally, consistent with progressive lymphoma. 2. No solid visceral organ or osseous involvement.       INTERVAL HISTORY: Please see below for problem oriented charting. She returns with her husband for further follow-up The patient has avoided port placement She has noticed progressive lymphadenopathy in her neck She denies chest pain or shortness of breath The patient denies any recent signs or symptoms of bleeding such as spontaneous epistaxis, hematuria or hematochezia.   REVIEW OF  SYSTEMS:   Constitutional: Denies fevers, chills or abnormal weight loss Eyes: Denies blurriness of vision Ears, nose, mouth, throat, and face: Denies mucositis or sore throat Respiratory: Denies cough, dyspnea or wheezes Cardiovascular: Denies palpitation, chest discomfort or lower extremity swelling Gastrointestinal:  Denies nausea, heartburn or change in bowel habits Skin: Denies abnormal skin rashes Neurological:Denies numbness, tingling or new weaknesses Behavioral/Psych: Mood is stable, no new changes  All other systems were reviewed with the patient and are negative.  I have reviewed the past medical history, past surgical history, social history and family history with the patient and they are unchanged from previous note.  ALLERGIES:  has No Known Allergies.  MEDICATIONS:  Current Outpatient Prescriptions  Medication Sig Dispense Refill  . acyclovir (ZOVIRAX) 400 MG tablet Take 1 tablet (400 mg total) by mouth daily. 30 tablet 3  . allopurinol (ZYLOPRIM) 300 MG tablet Take 1 tablet (300 mg total) by mouth daily. 30 tablet 3  . apixaban (ELIQUIS) 5 MG TABS tablet Take 1 tablet (5 mg total) by mouth 2 (two) times daily. 60 tablet 11  . carvedilol (COREG) 12.5 MG tablet Take 6.25 mg by mouth 2 (two) times daily.     . furosemide (LASIX) 40 MG tablet Take 1 tablet (40 mg total) by mouth daily. 30 tablet 6  . medroxyPROGESTERone (PROVERA) 5 MG tablet TAKE 1 TABLET BY MOUTH DAILY FOR 5 DAYS EVERY OTHER MONTH IF NO SPONTANEOUS MENSES 15 tablet 1  . ondansetron (ZOFRAN) 8 MG tablet Take 1 tablet (8 mg total) by mouth every 8 (eight) hours as needed for refractory nausea / vomiting. 30 tablet 1  . predniSONE (DELTASONE) 10 MG tablet Take  1 tablet (10 mg total) by mouth daily with breakfast. 30 tablet 0  . predniSONE (DELTASONE) 50 MG tablet Take in addition to previous prescription for total of 60 mg 7 tablet 0  . prochlorperazine (COMPAZINE) 10 MG tablet Take 1 tablet (10 mg total) by  mouth every 6 (six) hours as needed (Nausea or vomiting). 30 tablet 1  . spironolactone (ALDACTONE) 25 MG tablet Take 0.5 tablets (12.5 mg total) by mouth daily. 30 tablet 3  . Vitamin D, Cholecalciferol, 1000 units CAPS Take 1,000 Units by mouth every morning.      No current facility-administered medications for this visit.     PHYSICAL EXAMINATION: ECOG PERFORMANCE STATUS: 1 - Symptomatic but completely ambulatory  Vitals:   06/11/17 1202  BP: 110/73  Pulse: 71  Resp: 20  Temp: 97.9 F (36.6 C)  SpO2: 100%   Filed Weights   06/11/17 1202  Weight: 188 lb 11.2 oz (85.6 kg)    GENERAL:alert, no distress and comfortable SKIN: skin color, texture, turgor are normal, no rashes or significant lesions EYES: normal, Conjunctiva are pink and non-injected, sclera clear OROPHARYNX:no exudate, no erythema and lips, buccal mucosa, and tongue normal  NECK: supple, thyroid normal size, non-tender, without nodularity LYMPH: She has diffuse palpable lymphadenopathy in her neck  LUNGS: clear to auscultation and percussion with normal breathing effort HEART: She has irregular rate and rhythm without murmurs of significant bilateral lower extremity edema ABDOMEN:abdomen soft, non-tender and normal bowel sounds Musculoskeletal:no cyanosis of digits and no clubbing  NEURO: alert & oriented x 3 with fluent speech, no focal motor/sensory deficits  LABORATORY DATA:  I have reviewed the data as listed    Component Value Date/Time   NA 142 06/11/2017 0947   K 3.9 06/11/2017 0947   CL 103 03/23/2017 1015   CL 105 01/18/2013 0912   CO2 22 06/11/2017 0947   GLUCOSE 93 06/11/2017 0947   GLUCOSE 83 01/18/2013 0912   BUN 16.8 06/11/2017 0947   CREATININE 1.0 06/11/2017 0947   CALCIUM 9.7 06/11/2017 0947   PROT 7.1 06/11/2017 0947   ALBUMIN 4.1 06/11/2017 0947   AST 16 06/11/2017 0947   ALT 11 06/11/2017 0947   ALKPHOS 49 06/11/2017 0947   BILITOT 1.43 (H) 06/11/2017 0947   GFRNONAA 68  03/23/2017 1015   GFRAA 78 03/23/2017 1015    No results found for: SPEP, UPEP  Lab Results  Component Value Date   WBC 110.4 (HH) 06/11/2017   NEUTROABS 10.6 (H) 06/11/2017   HGB 12.7 06/11/2017   HCT 41.2 06/11/2017   MCV 87.7 06/11/2017   PLT 259 06/11/2017      Chemistry      Component Value Date/Time   NA 142 06/11/2017 0947   K 3.9 06/11/2017 0947   CL 103 03/23/2017 1015   CL 105 01/18/2013 0912   CO2 22 06/11/2017 0947   BUN 16.8 06/11/2017 0947   CREATININE 1.0 06/11/2017 0947      Component Value Date/Time   CALCIUM 9.7 06/11/2017 0947   ALKPHOS 49 06/11/2017 0947   AST 16 06/11/2017 0947   ALT 11 06/11/2017 0947   BILITOT 1.43 (H) 06/11/2017 0947       RADIOGRAPHIC STUDIES: I have personally reviewed the radiological images as listed and agreed with the findings in the report. Nm Pet Image Restag (ps) Skull Base To Thigh  Result Date: 05/27/2017 CLINICAL DATA:  Subsequent treatment strategy for chronic lymphocytic leukemia. EXAM: NUCLEAR MEDICINE PET SKULL BASE TO  THIGH TECHNIQUE: 9.74 mCi F-18 FDG was injected intravenously. Full-ring PET imaging was performed from the skull base to thigh after the radiotracer. CT data was obtained and used for attenuation correction and anatomic localization. FASTING BLOOD GLUCOSE:  Value: 86 mg/dl COMPARISON:  PET-CT 11/13/2016 and 04/24/2015. FINDINGS: NECK Interval enlargement of numerous cervical lymph nodes bilaterally. These lymph nodes demonstrate low-level hypermetabolic activity with an SUV max measuring up to 4.7 on the right.There are no lesions of the pharyngeal mucosal space. CHEST Progressive enlargement of multiple axillary lymph nodes bilaterally, demonstrating low level hypermetabolic activity. Largest right axillary node measures 18 mm on image 49 (previously 10 mm). This has an SUV max of 4.1 (previously 2.3). There is an 18 mm left axillary node on image 42. There is no hypermetabolic mediastinal or hilar  nodal activity. At the site of the previously demonstrated right paratracheal node, there is an enlarging nodule which measures water density, probably a treated node. There is no suspicious pulmonary activity. The lungs are clear. Cardiomegaly and coronary artery atherosclerosis again noted. ABDOMEN/PELVIS There is no hypermetabolic activity within the liver, adrenal glands, spleen or pancreas. The spleen size and activity of remain within normal limits. There has been interval enlargement of multiple small retroperitoneal, mesenteric and pelvic lymph nodes bilaterally. Most of the intra- abdominal nodes demonstrate activity similar to blood pool. There are external iliac nodes on the right which are mildly hypermetabolic (SUV max 5.0). There are mildly enlarged and hypermetabolic inguinal lymph nodes bilaterally. An index left inguinal node measures 15 mm on image 174 and has an SUV max of 4.4. SKELETON There is no hypermetabolic activity to suggest osseous metastatic disease. IMPRESSION: 1. Progressive hypermetabolic adenopathy, primarily involving cervical, axillary, pelvic and inguinal lymph nodes bilaterally, consistent with progressive lymphoma. 2. No solid visceral organ or osseous involvement. Electronically Signed   By: Richardean Sale M.D.   On: 05/27/2017 14:13    ASSESSMENT & PLAN:  CLL (chronic lymphocytic leukemia) ([place]) The patient has high risk disease She started crying whenever I mention chemotherapy She is avoiding port placement for many reasons Her husband is present We discussed the risks, benefits, side effects and alternative of treatment She is distressed over the prospect of possibly losing her hair For this reason, I am avoiding FCR I recommend a trial of bendamustine and rituximab I will give significant dose adjustment to rituximab for cycle 1 given anticipated high risk of infusion reaction I recommend increasing prednisone to 60 mg daily I have informed pharmacy to get  rasburicase ready She is started on allopurinol  We discussed the role of chemotherapy is of palliative intent The decision was made based on publication in the Blood: Randomized trial of bendamustine-rituximab or R-CHOP/R-CVP in first-line treatment of indolent NHL or MCL: the BRIGHT study.  [place] [address], Lucianne Lei der [address], [place], [address] M, Kwan YL, Simpson D, Craig M, Kolibaba K, Issa S, [place], [place] DM, Munteanu M, Aram Beecham JM SO  Blood. 2014;123(19):2944.  The chemotherapy consists of   1. Bendamustine at 90 mg/m2 on day 1 & 2 2. Rituximab at 375 mg/m2 on day 1 Each cycle + 28 days  Plan for 6 cycles total with or without Neulasta support  In the international phase III BRIGHT trial, 447 previously untreated patients with advanced stage follicular (n = 294 patients), mantle cell (n = 74 patients), or other indolent lymphoma were randomly assigned to six cycles of BR  according to the same dose and schedule described above or to R-CHOP or R-CVP. BR resulted in similar complete (31 versus 25 percent) and overall (97 versus 91 percent) response rates. BR was associated with higher rates of vomiting and drug hypersensitivity and lower rates of peripheral neuropathy/paresthesia and alopecia. The use of prophylactic antiemetics was not specified in the protocol and was more common among patients assigned to R-CHOP.   We discussed some of the risks, benefits and side-effects of Rituximab with Bendamustine.   Some of the short term side-effects included, though not limited to, risk of fatigue, weight loss, tumor lysis syndrome, risk of allergic reactions, pancytopenia, life-threatening infections, need for transfusions of blood products, nausea, vomiting, change in bowel habits, admission to hospital for various reasons, and risks of death.   Long term side-effects are also discussed including permanent damage to nerve function, chronic fatigue, and rare  secondary malignancy including bone marrow disorders.   The patient is aware that the response rates discussed earlier is not guaranteed.    After a long discussion, patient made an informed decision to proceed with the prescribed plan of care.   Patient education material was dispensed As above, I plan to reduce a dose of rituximab for cycle 1 Given her young age, I will not prescribe G-CSF upfront She is started on acyclovir for antimicrobial prophylaxis.  Hyperuricemia She is at high risk for tumor lysis syndrome I recommend she stop Lasix She is recommend increase oral fluid hydration She is on allopurinol and I am going to give her 1 dose of rasburicase I will see her back within the week of treatment for toxicity review  Nonischemic cardiomyopathy ([place]) She developed congestive heart failure and appears to be symptomatic She is frail She will continue medication management and anticoagulation therapy as directed by her cardiologist  Goals of care, counseling/discussion The patient is aware she has incurable disease and treatment is strictly palliative. Even though this disease is not curable but is highly treatable I recommend she stop working in the future to focus on treatment She has brought some FMLA paperwork and I will help get her disability   Orders Placed This Encounter  Procedures  . CBC with Differential    Standing Status:   Standing    Number of Occurrences:   20    Standing Expiration Date:   06/12/2018  . Comprehensive metabolic panel    Standing Status:   Standing    Number of Occurrences:   20    Standing Expiration Date:   06/12/2018  . PHYSICIAN COMMUNICATION ORDER    Hepatitis B Virus screening with HBsAg and anti-HBc recommended prior to treatment with rituximab (Rituxan), ofatumumab (Arzerra) or obinutuzumab Dyann Kief).   All questions were answered. The patient knows to call the clinic with any problems, questions or concerns. No barriers to learning  was detected. I spent 30 minutes counseling the patient face to face. The total time spent in the appointment was 40 minutes and more than 50% was on counseling and review of test results     Heath Lark, MD 06/12/2017 10:46 AM

## 2017-06-12 NOTE — Assessment & Plan Note (Signed)
She developed congestive heart failure and appears to be symptomatic She is frail She will continue medication management and anticoagulation therapy as directed by her cardiologist

## 2017-06-15 ENCOUNTER — Other Ambulatory Visit (HOSPITAL_COMMUNITY): Payer: Self-pay

## 2017-06-16 ENCOUNTER — Ambulatory Visit (HOSPITAL_BASED_OUTPATIENT_CLINIC_OR_DEPARTMENT_OTHER): Payer: BLUE CROSS/BLUE SHIELD

## 2017-06-16 VITALS — BP 112/83 | HR 88 | Temp 99.5°F | Resp 20

## 2017-06-16 DIAGNOSIS — Z5111 Encounter for antineoplastic chemotherapy: Secondary | ICD-10-CM | POA: Diagnosis not present

## 2017-06-16 DIAGNOSIS — Z5112 Encounter for antineoplastic immunotherapy: Secondary | ICD-10-CM

## 2017-06-16 DIAGNOSIS — C9110 Chronic lymphocytic leukemia of B-cell type not having achieved remission: Secondary | ICD-10-CM

## 2017-06-16 MED ORDER — DIPHENHYDRAMINE HCL 25 MG PO CAPS
50.0000 mg | ORAL_CAPSULE | Freq: Once | ORAL | Status: AC
  Administered 2017-06-16: 50 mg via ORAL

## 2017-06-16 MED ORDER — DEXAMETHASONE SODIUM PHOSPHATE 10 MG/ML IJ SOLN
10.0000 mg | Freq: Once | INTRAMUSCULAR | Status: AC
  Administered 2017-06-16: 10 mg via INTRAVENOUS

## 2017-06-16 MED ORDER — FAMOTIDINE IN NACL 20-0.9 MG/50ML-% IV SOLN
20.0000 mg | Freq: Once | INTRAVENOUS | Status: AC
  Administered 2017-06-16: 20 mg via INTRAVENOUS

## 2017-06-16 MED ORDER — FAMOTIDINE IN NACL 20-0.9 MG/50ML-% IV SOLN
INTRAVENOUS | Status: AC
  Filled 2017-06-16: qty 50

## 2017-06-16 MED ORDER — SODIUM CHLORIDE 0.9 % IV SOLN
Freq: Once | INTRAVENOUS | Status: AC
  Administered 2017-06-16: 11:00:00 via INTRAVENOUS

## 2017-06-16 MED ORDER — RITUXIMAB CHEMO INJECTION 500 MG/50ML
100.0000 mg/m2 | Freq: Once | INTRAVENOUS | Status: AC
  Administered 2017-06-16: 200 mg via INTRAVENOUS
  Filled 2017-06-16: qty 20

## 2017-06-16 MED ORDER — DIPHENHYDRAMINE HCL 25 MG PO CAPS
ORAL_CAPSULE | ORAL | Status: AC
Start: 2017-06-16 — End: 2017-06-16
  Filled 2017-06-16: qty 2

## 2017-06-16 MED ORDER — PALONOSETRON HCL INJECTION 0.25 MG/5ML
0.2500 mg | Freq: Once | INTRAVENOUS | Status: AC
  Administered 2017-06-16: 0.25 mg via INTRAVENOUS

## 2017-06-16 MED ORDER — SODIUM CHLORIDE 0.9 % IV SOLN
3.0000 mg | Freq: Once | INTRAVENOUS | Status: AC
  Administered 2017-06-16: 3 mg via INTRAVENOUS
  Filled 2017-06-16: qty 2

## 2017-06-16 MED ORDER — METHYLPREDNISOLONE SODIUM SUCC 125 MG IJ SOLR
125.0000 mg | Freq: Once | INTRAMUSCULAR | Status: AC | PRN
  Administered 2017-06-16: 125 mg via INTRAVENOUS

## 2017-06-16 MED ORDER — PALONOSETRON HCL INJECTION 0.25 MG/5ML
INTRAVENOUS | Status: AC
  Filled 2017-06-16: qty 5

## 2017-06-16 MED ORDER — DEXAMETHASONE SODIUM PHOSPHATE 10 MG/ML IJ SOLN
INTRAMUSCULAR | Status: AC
  Filled 2017-06-16: qty 1

## 2017-06-16 MED ORDER — ACETAMINOPHEN 325 MG PO TABS
650.0000 mg | ORAL_TABLET | Freq: Once | ORAL | Status: AC
  Administered 2017-06-16: 650 mg via ORAL

## 2017-06-16 MED ORDER — ACETAMINOPHEN 325 MG PO TABS
ORAL_TABLET | ORAL | Status: AC
  Filled 2017-06-16: qty 2

## 2017-06-16 MED ORDER — SODIUM CHLORIDE 0.9 % IV SOLN
90.0000 mg/m2 | Freq: Once | INTRAVENOUS | Status: AC
  Administered 2017-06-16: 175 mg via INTRAVENOUS
  Filled 2017-06-16: qty 7

## 2017-06-16 NOTE — Patient Instructions (Addendum)
[facility] Discharge Instructions for Patients Receiving Chemotherapy  Today you received the following chemotherapy agents: Rituxan, Bendeka, Elitek  To help prevent nausea and vomiting after your treatment, we encourage you to take your nausea medication as prescribed by MD.   If you develop nausea and vomiting that is not controlled by your nausea medication, call the clinic.   BELOW ARE SYMPTOMS THAT SHOULD BE REPORTED IMMEDIATELY:  *FEVER GREATER THAN 100.5 F  *CHILLS WITH OR WITHOUT FEVER  NAUSEA AND VOMITING THAT IS NOT CONTROLLED WITH YOUR NAUSEA MEDICATION  *UNUSUAL SHORTNESS OF BREATH  *UNUSUAL BRUISING OR BLEEDING  TENDERNESS IN MOUTH AND THROAT WITH OR WITHOUT PRESENCE OF ULCERS  *URINARY PROBLEMS  *BOWEL PROBLEMS  UNUSUAL RASH Items with * indicate a potential emergency and should be followed up as soon as possible.  Feel free to call the clinic you have any questions or concerns. The clinic phone number is [phone].  Please show the [facility] at check-in to the Emergency Department and triage nurse.  Rituximab injection What is this medicine? RITUXIMAB (ri TUX i mab) is a monoclonal antibody. It is used to treat certain types of cancer like non-Hodgkin lymphoma and chronic lymphocytic leukemia. It is also used to treat rheumatoid arthritis, granulomatosis with polyangiitis (or Wegener's granulomatosis), and microscopic polyangiitis. This medicine may be used for other purposes; ask your health care provider or pharmacist if you have questions. COMMON BRAND NAME(S): Rituxan What should I tell my health care provider before I take this medicine? They need to know if you have any of these conditions: -heart disease -infection (especially a virus infection such as hepatitis B, chickenpox, cold sores, or herpes) -immune system problems -irregular heartbeat -kidney disease -lung or breathing disease, like asthma -recently received or  scheduled to receive a vaccine -an unusual or allergic reaction to rituximab, mouse proteins, other medicines, foods, dyes, or preservatives -pregnant or trying to get pregnant -breast-feeding How should I use this medicine? This medicine is for infusion into a vein. It is administered in a hospital or clinic by a specially trained health care professional. A special MedGuide will be given to you by the pharmacist with each prescription and refill. Be sure to read this information carefully each time. Talk to your pediatrician regarding the use of this medicine in children. This medicine is not approved for use in children. Overdosage: If you think you have taken too much of this medicine contact a poison control center or emergency room at once. NOTE: This medicine is only for you. Do not share this medicine with others. What if I miss a dose? It is important not to miss a dose. Call your doctor or health care professional if you are unable to keep an appointment. What may interact with this medicine? -cisplatin -other medicines for arthritis like disease modifying antirheumatic drugs or tumor necrosis factor inhibitors -live virus vaccines This list may not describe all possible interactions. Give your health care provider a list of all the medicines, herbs, non-prescription drugs, or dietary supplements you use. Also tell them if you smoke, drink alcohol, or use illegal drugs. Some items may interact with your medicine. What should I watch for while using this medicine? Your condition will be monitored carefully while you are receiving this medicine. You may need blood work done while you are taking this medicine. This medicine can cause serious allergic reactions. To reduce your risk you may need to take medicine before treatment with this medicine.  Take your medicine as directed. In some patients, this medicine may cause a serious brain infection that may cause death. If you have any  problems seeing, thinking, speaking, walking, or standing, tell your doctor right away. If you cannot reach your doctor, urgently seek other source of medical care. Call your doctor or health care professional for advice if you get a fever, chills or sore throat, or other symptoms of a cold or flu. Do not treat yourself. This drug decreases your body's ability to fight infections. Try to avoid being around people who are sick. Do not become pregnant while taking this medicine or for 12 months after stopping it. Women should inform their doctor if they wish to become pregnant or think they might be pregnant. There is a potential for serious side effects to an unborn child. Talk to your health care professional or pharmacist for more information. What side effects may I notice from receiving this medicine? Side effects that you should report to your doctor or health care professional as soon as possible: -breathing problems -chest pain -dizziness or feeling faint -fast, irregular heartbeat -low blood counts - this medicine may decrease the number of white blood cells, red blood cells and platelets. You may be at increased risk for infections and bleeding. -mouth sores -redness, blistering, peeling or loosening of the skin, including inside the mouth (this can be added for any serious or exfoliative rash that could lead to hospitalization) -signs of infection - fever or chills, cough, sore throat, pain or difficulty passing urine -signs and symptoms of kidney injury like trouble passing urine or change in the amount of urine -signs and symptoms of liver injury like dark yellow or brown urine; general ill feeling or flu-like symptoms; light-colored stools; loss of appetite; nausea; right upper belly pain; unusually weak or tired; yellowing of the eyes or skin -stomach pain -vomiting Side effects that usually do not require medical attention (report to your doctor or health care professional if they  continue or are bothersome): -headache -joint pain -muscle cramps or muscle pain This list may not describe all possible side effects. Call your doctor for medical advice about side effects. You may report side effects to FDA at 1-800-FDA-1088. Where should I keep my medicine? This drug is given in a hospital or clinic and will not be stored at home. NOTE: This sheet is a summary. It may not cover all possible information. If you have questions about this medicine, talk to your doctor, pharmacist, or health care provider.  2018 Elsevier/Gold Standard (2016-04-30 15:28:09)  Bendamustine Injection What is this medicine? BENDAMUSTINE (BEN da MUS teen) is a chemotherapy drug. It is used to treat chronic lymphocytic leukemia and non-Hodgkin lymphoma. This medicine may be used for other purposes; ask your health care provider or pharmacist if you have questions. COMMON BRAND NAME(S): BENDEKA, Treanda What should I tell my health care provider before I take this medicine? They need to know if you have any of these conditions: -infection (especially a virus infection such as chickenpox, cold sores, or herpes) -kidney disease -liver disease -an unusual or allergic reaction to bendamustine, mannitol, other medicines, foods, dyes, or preservatives -pregnant or trying to get pregnant -breast-feeding How should I use this medicine? This medicine is for infusion into a vein. It is given by a health care professional in a hospital or clinic setting. Talk to your pediatrician regarding the use of this medicine in children. Special care may be needed. Overdosage: If you think you  have taken too much of this medicine contact a poison control center or emergency room at once. NOTE: This medicine is only for you. Do not share this medicine with others. What if I miss a dose? It is important not to miss your dose. Call your doctor or health care professional if you are unable to keep an appointment. What  may interact with this medicine? Do not take this medicine with any of the following medications: -clozapine This medicine may also interact with the following medications: -atazanavir -cimetidine -ciprofloxacin -enoxacin -fluvoxamine -medicines for seizures like carbamazepine and phenobarbital -mexiletine -rifampin -tacrine -thiabendazole -zileuton This list may not describe all possible interactions. Give your health care provider a list of all the medicines, herbs, non-prescription drugs, or dietary supplements you use. Also tell them if you smoke, drink alcohol, or use illegal drugs. Some items may interact with your medicine. What should I watch for while using this medicine? This drug may make you feel generally unwell. This is not uncommon, as chemotherapy can affect healthy cells as well as cancer cells. Report any side effects. Continue your course of treatment even though you feel ill unless your doctor tells you to stop. You may need blood work done while you are taking this medicine. Call your doctor or health care professional for advice if you get a fever, chills or sore throat, or other symptoms of a cold or flu. Do not treat yourself. This drug decreases your body's ability to fight infections. Try to avoid being around people who are sick. This medicine may increase your risk to bruise or bleed. Call your doctor or health care professional if you notice any unusual bleeding. Talk to your doctor about your risk of cancer. You may be more at risk for certain types of cancers if you take this medicine. Do not become pregnant while taking this medicine or for 3 months after stopping it. Women should inform their doctor if they wish to become pregnant or think they might be pregnant. Men should not father a child while taking this medicine and for 3 months after stopping it.There is a potential for serious side effects to an unborn child. Talk to your health care professional or  pharmacist for more information. Do not breast-feed an infant while taking this medicine. This medicine may interfere with the ability to have a child. You should talk with your doctor or health care professional if you are concerned about your fertility. What side effects may I notice from receiving this medicine? Side effects that you should report to your doctor or health care professional as soon as possible: -allergic reactions like skin rash, itching or hives, swelling of the face, lips, or tongue -low blood counts - this medicine may decrease the number of white blood cells, red blood cells and platelets. You may be at increased risk for infections and bleeding. -redness, blistering, peeling or loosening of the skin, including inside the mouth -signs of infection - fever or chills, cough, sore throat, pain or difficulty passing urine -signs of decreased platelets or bleeding - bruising, pinpoint red spots on the skin, black, tarry stools, blood in the urine -signs of decreased red blood cells - unusually weak or tired, fainting spells, lightheadedness -signs and symptoms of kidney injury like trouble passing urine or change in the amount of urine -signs and symptoms of liver injury like dark yellow or brown urine; general ill feeling or flu-like symptoms; light-colored stools; loss of appetite; nausea; right upper belly pain; unusually  weak or tired; yellowing of the eyes or skin Side effects that usually do not require medical attention (report to your doctor or health care professional if they continue or are bothersome): -constipation -decreased appetite -diarrhea -headache -mouth sores -nausea/vomiting -tiredness This list may not describe all possible side effects. Call your doctor for medical advice about side effects. You may report side effects to FDA at 1-800-FDA-1088. Where should I keep my medicine? This drug is given in a hospital or clinic and will not be stored at  home. NOTE: This sheet is a summary. It may not cover all possible information. If you have questions about this medicine, talk to your doctor, pharmacist, or health care provider.  2018 Elsevier/Gold Standard (2015-07-26 08:45:41)  Rasburicase Injection What is this medicine? RASBURICASE (ras BURE i kase) breaks down uric acid in the blood. It is used to prevent and to treat high levels of uric acid caused by cancer treatment. This medicine may be used for other purposes; ask your health care provider or pharmacist if you have questions. COMMON BRAND NAME(S): Elitek What should I tell my health care provider before I take this medicine? They need to know if you have any of these conditions: -G6PD deficiency -history of anemia -history of blood transfusion -an unusual or allergic reaction to rasburicase, yeast, mannitol, other medicines, foods, dyes, or preservatives -pregnant or trying to get pregnant -breast-feeding How should I use this medicine? This medicine is for infusion into a vein. It is given by a health care professional in a hospital or clinic setting. Talk to your pediatrician regarding the use of this medicine in children. While this drug may be prescribed for children as young as 21 month old for selected conditions, precautions do apply. Overdosage: If you think you have taken too much of this medicine contact a poison control center or emergency room at once. NOTE: This medicine is only for you. Do not share this medicine with others. What if I miss a dose? This does not apply. What may interact with this medicine? Interactions have not been studied. Give your health care provider a list of all the medicines, herbs, non-prescription drugs, or dietary supplements you use. Also tell them if you smoke, drink alcohol, or use illegal drugs. Some items may interact with your medicine. This list may not describe all possible interactions. Give your health care provider a list of  all the medicines, herbs, non-prescription drugs, or dietary supplements you use. Also tell them if you smoke, drink alcohol, or use illegal drugs. Some items may interact with your medicine. What should I watch for while using this medicine? Your condition will be monitored carefully while you are receiving this medicine. You will need to have regular blood tests during your treatment. What side effects may I notice from receiving this medicine? Side effects that you should report to your doctor or health care professional as soon as possible: -allergic reactions like skin rash, itching or hives, swelling of the face, lips, or tongue -blue color to lips or nailbeds -breathing problems -chest pain, tightness -confusion -fast, irregular heartbeat -feeling faint or lightheaded, falls -low blood pressure -seizures -unusually weak or tired -yellowing of the eyes or skin Side effects that usually do not require medical attention (report to your doctor or health care professional if they continue or are bothersome): -abdominal pain -constipation -diarrhea -fever -headache -nausea, vomiting -swelling of the ankles, feet, hands This list may not describe all possible side effects. Call your doctor for  medical advice about side effects. You may report side effects to FDA at 1-800-FDA-1088. Where should I keep my medicine? This drug is given in a hospital or clinic and will not be stored at home. NOTE: This sheet is a summary. It may not cover all possible information. If you have questions about this medicine, talk to your doctor, pharmacist, or health care provider.  2018 Elsevier/Gold Standard (2014-03-17 13:24:23)

## 2017-06-17 ENCOUNTER — Ambulatory Visit (HOSPITAL_BASED_OUTPATIENT_CLINIC_OR_DEPARTMENT_OTHER): Payer: BLUE CROSS/BLUE SHIELD

## 2017-06-17 VITALS — BP 138/84 | HR 99 | Temp 98.1°F | Resp 18

## 2017-06-17 DIAGNOSIS — C9110 Chronic lymphocytic leukemia of B-cell type not having achieved remission: Secondary | ICD-10-CM

## 2017-06-17 DIAGNOSIS — Z5111 Encounter for antineoplastic chemotherapy: Secondary | ICD-10-CM

## 2017-06-17 MED ORDER — SODIUM CHLORIDE 0.9 % IV SOLN
90.0000 mg/m2 | Freq: Once | INTRAVENOUS | Status: AC
  Administered 2017-06-17: 175 mg via INTRAVENOUS
  Filled 2017-06-17: qty 7

## 2017-06-17 MED ORDER — SODIUM CHLORIDE 0.9 % IV SOLN
Freq: Once | INTRAVENOUS | Status: AC
  Administered 2017-06-17: 15:00:00 via INTRAVENOUS

## 2017-06-17 MED ORDER — DEXAMETHASONE SODIUM PHOSPHATE 10 MG/ML IJ SOLN
INTRAMUSCULAR | Status: AC
  Filled 2017-06-17: qty 1

## 2017-06-17 MED ORDER — DEXAMETHASONE SODIUM PHOSPHATE 10 MG/ML IJ SOLN
10.0000 mg | Freq: Once | INTRAMUSCULAR | Status: AC
  Administered 2017-06-17: 10 mg via INTRAVENOUS

## 2017-06-17 NOTE — Patient Instructions (Signed)
[facility] Discharge Instructions for Patients Receiving Chemotherapy  Today you received the following chemotherapy agents: Bendeka.  To help prevent nausea and vomiting after your treatment, we encourage you to take your nausea medication as prescribed by MD.   If you develop nausea and vomiting that is not controlled by your nausea medication, call the clinic.   BELOW ARE SYMPTOMS THAT SHOULD BE REPORTED IMMEDIATELY:  *FEVER GREATER THAN 100.5 F  *CHILLS WITH OR WITHOUT FEVER  NAUSEA AND VOMITING THAT IS NOT CONTROLLED WITH YOUR NAUSEA MEDICATION  *UNUSUAL SHORTNESS OF BREATH  *UNUSUAL BRUISING OR BLEEDING  TENDERNESS IN MOUTH AND THROAT WITH OR WITHOUT PRESENCE OF ULCERS  *URINARY PROBLEMS  *BOWEL PROBLEMS  UNUSUAL RASH Items with * indicate a potential emergency and should be followed up as soon as possible.  Feel free to call the clinic you have any questions or concerns. The clinic phone number is [phone].  Please show the [facility] at check-in to the Emergency Department and triage nurse.

## 2017-06-17 NOTE — Progress Notes (Signed)
During Rituxan infusion, patient began c/o lower back pain/dull pressure. Infusion paused and Dr. Alvy Bimler came to treatment area to see patient. Medicated per orders. Patient fully recovered with no complaints of back pain and treatment resumed at 50% of the initial infusion rate. Patient tolerated this well and completed treatment without further incident.

## 2017-06-18 ENCOUNTER — Other Ambulatory Visit: Payer: Self-pay | Admitting: Internal Medicine

## 2017-06-18 DIAGNOSIS — I10 Essential (primary) hypertension: Secondary | ICD-10-CM

## 2017-06-18 DIAGNOSIS — E876 Hypokalemia: Secondary | ICD-10-CM

## 2017-06-19 ENCOUNTER — Other Ambulatory Visit: Payer: Self-pay

## 2017-06-19 ENCOUNTER — Ambulatory Visit (HOSPITAL_COMMUNITY): Payer: BLUE CROSS/BLUE SHIELD | Attending: Cardiovascular Disease

## 2017-06-19 DIAGNOSIS — I340 Nonrheumatic mitral (valve) insufficiency: Secondary | ICD-10-CM

## 2017-06-19 DIAGNOSIS — I051 Rheumatic mitral insufficiency: Secondary | ICD-10-CM | POA: Insufficient documentation

## 2017-06-19 DIAGNOSIS — I480 Paroxysmal atrial fibrillation: Secondary | ICD-10-CM

## 2017-06-19 DIAGNOSIS — I428 Other cardiomyopathies: Secondary | ICD-10-CM

## 2017-06-19 DIAGNOSIS — I420 Dilated cardiomyopathy: Secondary | ICD-10-CM | POA: Diagnosis not present

## 2017-06-23 ENCOUNTER — Other Ambulatory Visit (HOSPITAL_BASED_OUTPATIENT_CLINIC_OR_DEPARTMENT_OTHER): Payer: BLUE CROSS/BLUE SHIELD

## 2017-06-23 ENCOUNTER — Ambulatory Visit (HOSPITAL_BASED_OUTPATIENT_CLINIC_OR_DEPARTMENT_OTHER): Payer: BLUE CROSS/BLUE SHIELD | Admitting: Hematology and Oncology

## 2017-06-23 ENCOUNTER — Telehealth: Payer: Self-pay | Admitting: Hematology and Oncology

## 2017-06-23 ENCOUNTER — Ambulatory Visit (HOSPITAL_COMMUNITY)
Admission: RE | Admit: 2017-06-23 | Discharge: 2017-06-23 | Disposition: A | Discharge status: Home / Self Care | Payer: BLUE CROSS/BLUE SHIELD | Source: Ambulatory Visit | Attending: Gastroenterology | Admitting: Gastroenterology

## 2017-06-23 DIAGNOSIS — C9110 Chronic lymphocytic leukemia of B-cell type not having achieved remission: Secondary | ICD-10-CM | POA: Diagnosis not present

## 2017-06-23 DIAGNOSIS — D630 Anemia in neoplastic disease: Secondary | ICD-10-CM

## 2017-06-23 DIAGNOSIS — Z7901 Long term (current) use of anticoagulants: Secondary | ICD-10-CM | POA: Diagnosis not present

## 2017-06-23 DIAGNOSIS — R1310 Dysphagia, unspecified: Secondary | ICD-10-CM | POA: Insufficient documentation

## 2017-06-23 DIAGNOSIS — I482 Chronic atrial fibrillation, unspecified: Secondary | ICD-10-CM | POA: Insufficient documentation

## 2017-06-23 DIAGNOSIS — E790 Hyperuricemia without signs of inflammatory arthritis and tophaceous disease: Secondary | ICD-10-CM

## 2017-06-23 LAB — CBC WITH DIFFERENTIAL/PLATELET
BASO%: 0.8 % (ref 0.0–2.0)
BASOS ABS: 0.8 10*3/uL — AB (ref 0.0–0.1)
EOS ABS: 0.3 10*3/uL (ref 0.0–0.5)
EOS%: 0.3 % (ref 0.0–7.0)
HEMATOCRIT: 31 % — AB (ref 34.8–46.6)
HEMOGLOBIN: 9.8 g/dL — AB (ref 11.6–15.9)
LYMPH#: 93.2 10*3/uL — AB (ref 0.9–3.3)
LYMPH%: 92.7 % — ABNORMAL HIGH (ref 14.0–49.7)
MCH: 27.6 pg (ref 25.1–34.0)
MCHC: 31.6 g/dL (ref 31.5–36.0)
MCV: 87.4 fL (ref 79.5–101.0)
MONO#: 1.1 10*3/uL — AB (ref 0.1–0.9)
MONO%: 1.1 % (ref 0.0–14.0)
NEUT%: 5.1 % — ABNORMAL LOW (ref 38.4–76.8)
NEUTROS ABS: 5.1 10*3/uL (ref 1.5–6.5)
PLATELETS: 235 10*3/uL (ref 145–400)
RBC: 3.55 10*6/uL — ABNORMAL LOW (ref 3.70–5.45)
RDW: 16.1 % — AB (ref 11.2–14.5)
WBC: 100.7 10*3/uL (ref 3.9–10.3)

## 2017-06-23 LAB — COMPREHENSIVE METABOLIC PANEL
ALBUMIN: 3.7 g/dL (ref 3.5–5.0)
ALK PHOS: 55 U/L (ref 40–150)
ALT: 34 U/L (ref 0–55)
AST: 21 U/L (ref 5–34)
Anion Gap: 10 mEq/L (ref 3–11)
BILIRUBIN TOTAL: 1.88 mg/dL — AB (ref 0.20–1.20)
BUN: 14.4 mg/dL (ref 7.0–26.0)
CALCIUM: 9.1 mg/dL (ref 8.4–10.4)
CO2: 26 mEq/L (ref 22–29)
Chloride: 108 mEq/L (ref 98–109)
Creatinine: 0.9 mg/dL (ref 0.6–1.1)
EGFR: 82 mL/min/{1.73_m2} — AB (ref >90)
Glucose: 89 mg/dl (ref 70–140)
Potassium: 3.6 mEq/L (ref 3.5–5.1)
Sodium: 145 mEq/L (ref 136–145)
TOTAL PROTEIN: 6.3 g/dL — AB (ref 6.4–8.3)

## 2017-06-23 LAB — TECHNOLOGIST REVIEW

## 2017-06-23 LAB — URIC ACID: URIC ACID, SERUM: 3.8 mg/dL (ref 2.6–7.4)

## 2017-06-23 NOTE — Assessment & Plan Note (Signed)
She has persistent chronic atrial fibrillation and is currently rate control on anticoagulation therapy I recommend close follow-up with primary care doctor for medication management

## 2017-06-23 NOTE — Assessment & Plan Note (Addendum)
She tolerated cycle 1 of treatment well except for mild infusion reaction Her white count is still elevated The lymphadenopathy in the head and neck region has subsided somewhat Recommend she continue reduced dose prednisone 10 mg daily until her second dose of treatment She will continue acyclovir for antimicrobial prophylaxis and allopurinol for tumor lysis prophylaxis She is noted to have poor venous access Previously, we have discussed port placement but she declined

## 2017-06-23 NOTE — Telephone Encounter (Signed)
Per 9/18 los - no additional appts scheduled - Return for No new orders or return visit.

## 2017-06-23 NOTE — Assessment & Plan Note (Signed)
This is likely anemia of chronic disease. The patient denies recent history of bleeding such as epistaxis, hematuria or hematochezia. She is asymptomatic from the anemia. We will observe for now.  

## 2017-06-23 NOTE — Progress Notes (Signed)
[facility] OFFICE PROGRESS NOTE  Patient Care Team: Janith Lima, MD as PCP - General (Internal Medicine)  SUMMARY OF ONCOLOGIC HISTORY:   CLL (chronic lymphocytic leukemia) ([place])   04/05/2015 Pathology Results    Accession: [id number] flow cytometry confirmed CLL. FISH was positive for p53 mutation      04/24/2015 Imaging    Extensive lymphadenopathy throughout the neck, chest (axilla), abdomen and pelvis, as detailed above, compatible with the reported clinical history of lymphoma. 2. Mild splenomegaly.      05/03/2015 - 08/27/2015 Chemotherapy    She started on Ibrutinib, discontinued prematurely when her prescription ran out      10/10/2015 - 05/29/2017 Chemotherapy    She was restarted back on Ibrutinib      11/13/2016 PET scan    Significant generalized reduction in size of numerous lymph nodes in the neck, chest, abdomen, and pelvis. Previously the activity of these nodes was low-level and in general a similar low-level activity is present today, significantly less than mediastinal blood pool activity, compatible with [place] 2. 2. Coronary atherosclerosis. Mild cardiomegaly. 3. Mildly prominent endometrium for age without accentuated metabolic activity in the endometrium. Consider pelvic sonography for further characterization.      05/27/2017 PET scan    1. Progressive hypermetabolic adenopathy, primarily involving cervical, axillary, pelvic and inguinal lymph nodes bilaterally, consistent with progressive lymphoma. 2. No solid visceral organ or osseous involvement.      06/16/2017 -  Chemotherapy    The patient had palonosetron (ALOXI) injection 0.25 mg, 0.25 mg, Intravenous,  Once, 1 of 4 cycles  riTUXimab (RITUXAN) 200 mg in sodium chloride 0.9 % 250 mL (0.7407 mg/mL) chemo infusion, 100 mg/m2 = 200 mg (100 % of original dose 100 mg/m2), Intravenous,  Once, 1 of 4 cycles Dose modification: 100 mg/m2 (original dose 100 mg/m2, Cycle 1, Reason: Provider  Judgment, Comment: anticipate high risk infusion)  bendamustine (BENDEKA) 175 mg in sodium chloride 0.9 % 50 mL (3.0702 mg/mL) chemo infusion, 90 mg/m2 = 175 mg, Intravenous,  Once, 1 of 4 cycles  for chemotherapy treatment.  She received Rituximab and Bendamustine       INTERVAL HISTORY: Please see below for problem oriented charting. She returns for further follow-up She feels well She has no signs of recent infection The patient denies any recent signs or symptoms of bleeding such as spontaneous epistaxis, hematuria or hematochezia. She had persistent lymphadenopathy in neck swelling, stable She denies chest pain, shortness of breath or significantly edema  REVIEW OF SYSTEMS:   Constitutional: Denies fevers, chills or abnormal weight loss Eyes: Denies blurriness of vision Ears, nose, mouth, throat, and face: Denies mucositis or sore throat Respiratory: Denies cough, dyspnea or wheezes Cardiovascular: Denies palpitation, chest discomfort or lower extremity swelling Gastrointestinal:  Denies nausea, heartburn or change in bowel habits Skin: Denies abnormal skin rashes Neurological:Denies numbness, tingling or new weaknesses Behavioral/Psych: Mood is stable, no new changes  All other systems were reviewed with the patient and are negative.  I have reviewed the past medical history, past surgical history, social history and family history with the patient and they are unchanged from previous note.  ALLERGIES:  has No Known Allergies.  MEDICATIONS:  Current Outpatient Prescriptions  Medication Sig Dispense Refill  . acyclovir (ZOVIRAX) 400 MG tablet Take 1 tablet (400 mg total) by mouth daily. 30 tablet 3  . allopurinol (ZYLOPRIM) 300 MG tablet Take 1 tablet (300 mg total) by mouth daily. 30 tablet 3  .  apixaban (ELIQUIS) 5 MG TABS tablet Take 1 tablet (5 mg total) by mouth 2 (two) times daily. 60 tablet 11  . carvedilol (COREG) 12.5 MG tablet Take 6.25 mg by mouth 2 (two) times  daily.     . furosemide (LASIX) 40 MG tablet Take 1 tablet (40 mg total) by mouth daily. 30 tablet 6  . medroxyPROGESTERone (PROVERA) 5 MG tablet TAKE 1 TABLET BY MOUTH DAILY FOR 5 DAYS EVERY OTHER MONTH IF NO SPONTANEOUS MENSES 15 tablet 1  . ondansetron (ZOFRAN) 8 MG tablet Take 1 tablet (8 mg total) by mouth every 8 (eight) hours as needed for refractory nausea / vomiting. 30 tablet 1  . predniSONE (DELTASONE) 10 MG tablet Take 1 tablet (10 mg total) by mouth daily with breakfast. 30 tablet 0  . prochlorperazine (COMPAZINE) 10 MG tablet Take 1 tablet (10 mg total) by mouth every 6 (six) hours as needed (Nausea or vomiting). 30 tablet 1  . spironolactone (ALDACTONE) 25 MG tablet Take 0.5 tablets (12.5 mg total) by mouth daily. 30 tablet 3  . spironolactone (ALDACTONE) 25 MG tablet TAKE 1 TABLET BY MOUTH DAILY 90 tablet 1  . Vitamin D, Cholecalciferol, 1000 units CAPS Take 1,000 Units by mouth every morning.      No current facility-administered medications for this visit.     PHYSICAL EXAMINATION: ECOG PERFORMANCE STATUS: 1 - Symptomatic but completely ambulatory  Vitals:   06/23/17 1039  BP: (!) 141/112  Pulse: 94  Resp: 18  Temp: 98 F (36.7 C)  SpO2: 100%   Filed Weights   06/23/17 1039  Weight: 192 lb 6.4 oz (87.3 kg)    GENERAL:alert, no distress and comfortable.  She appears mildly cushingoid SKIN: skin color, texture, turgor are normal, no rashes or significant lesions EYES: normal, Conjunctiva are pink and non-injected, sclera clear OROPHARYNX:no exudate, no erythema and lips, buccal mucosa, and tongue normal  NECK: supple, thyroid normal size, non-tender, without nodularity LYMPH: She has persistent palpable supraclavicular lymphadenopathy and cervical lymphadenopathy, reduced in size LUNGS: clear to auscultation and percussion with normal breathing effort HEART: Irregular rate and rhythm, no murmurs,  no lower extremity edema ABDOMEN:abdomen soft, non-tender and  normal bowel sounds Musculoskeletal:no cyanosis of digits and no clubbing  NEURO: alert & oriented x 3 with fluent speech, no focal motor/sensory deficits  LABORATORY DATA:  I have reviewed the data as listed    Component Value Date/Time   NA 145 06/23/2017 1027   K 3.6 06/23/2017 1027   CL 103 03/23/2017 1015   CL 105 01/18/2013 0912   CO2 26 06/23/2017 1027   GLUCOSE 89 06/23/2017 1027   GLUCOSE 83 01/18/2013 0912   BUN 14.4 06/23/2017 1027   CREATININE 0.9 06/23/2017 1027   CALCIUM 9.1 06/23/2017 1027   PROT 6.3 (L) 06/23/2017 1027   ALBUMIN 3.7 06/23/2017 1027   AST 21 06/23/2017 1027   ALT 34 06/23/2017 1027   ALKPHOS 55 06/23/2017 1027   BILITOT 1.88 (H) 06/23/2017 1027   GFRNONAA 68 03/23/2017 1015   GFRAA 78 03/23/2017 1015    No results found for: SPEP, UPEP  Lab Results  Component Value Date   WBC 100.7 (HH) 06/23/2017   NEUTROABS 5.1 06/23/2017   HGB 9.8 (L) 06/23/2017   HCT 31.0 (L) 06/23/2017   MCV 87.4 06/23/2017   PLT 235 06/23/2017      Chemistry      Component Value Date/Time   NA 145 06/23/2017 1027   K 3.6 06/23/2017  1027   CL 103 03/23/2017 1015   CL 105 01/18/2013 0912   CO2 26 06/23/2017 1027   BUN 14.4 06/23/2017 1027   CREATININE 0.9 06/23/2017 1027      Component Value Date/Time   CALCIUM 9.1 06/23/2017 1027   ALKPHOS 55 06/23/2017 1027   AST 21 06/23/2017 1027   ALT 34 06/23/2017 1027   BILITOT 1.88 (H) 06/23/2017 1027       RADIOGRAPHIC STUDIES: I have personally reviewed the radiological images as listed and agreed with the findings in the report. Dg Esophagus  Result Date: 06/23/2017 CLINICAL DATA:  Dysphagia.  Lymphoma. EXAM: ESOPHOGRAM / BARIUM SWALLOW / BARIUM TABLET STUDY TECHNIQUE: Combined double contrast and single contrast examination performed using effervescent crystals, thick barium liquid, and thin barium liquid. The patient was observed with fluoroscopy swallowing a 13 mm barium sulphate tablet. FLUOROSCOPY  TIME:  Fluoroscopy Time:  2.3 minutes Radiation Exposure Index (if provided by the fluoroscopic device): 167.5 mGy Number of Acquired Spot Images: 0 COMPARISON:  PET-CT 05/27/2017. FINDINGS: Frontal and lateral views of the hypopharynx while swallowing are unremarkable. Double contrast imaging of the esophagus limited by patient difficulty and rapidly consuming effervescent crystals and barium. No gross esophageal diverticulum or focal mucosal ulceration is evident. There is some mass-effect on the distal esophagus likely related to cardiomegaly has no central mediastinal lymphadenopathy or mass lesion is evident on the PET-CT from 4 weeks ago. Esophageal motility is normal. 13 mm barium tablet passes readily into the stomach when taken with water. No evidence for hiatal hernia. IMPRESSION: Unremarkable double contrast barium esophagram with some limitation due to patient inability to rapidly consume effervescent crystals and barium. Electronically Signed   By: Misty Stanley M.D.   On: 06/23/2017 10:36   Nm Pet Image Restag (ps) Skull Base To Thigh  Result Date: 05/27/2017 CLINICAL DATA:  Subsequent treatment strategy for chronic lymphocytic leukemia. EXAM: NUCLEAR MEDICINE PET SKULL BASE TO THIGH TECHNIQUE: 9.74 mCi F-18 FDG was injected intravenously. Full-ring PET imaging was performed from the skull base to thigh after the radiotracer. CT data was obtained and used for attenuation correction and anatomic localization. FASTING BLOOD GLUCOSE:  Value: 86 mg/dl COMPARISON:  PET-CT 11/13/2016 and 04/24/2015. FINDINGS: NECK Interval enlargement of numerous cervical lymph nodes bilaterally. These lymph nodes demonstrate low-level hypermetabolic activity with an SUV max measuring up to 4.7 on the right.There are no lesions of the pharyngeal mucosal space. CHEST Progressive enlargement of multiple axillary lymph nodes bilaterally, demonstrating low level hypermetabolic activity. Largest right axillary node measures  18 mm on image 49 (previously 10 mm). This has an SUV max of 4.1 (previously 2.3). There is an 18 mm left axillary node on image 42. There is no hypermetabolic mediastinal or hilar nodal activity. At the site of the previously demonstrated right paratracheal node, there is an enlarging nodule which measures water density, probably a treated node. There is no suspicious pulmonary activity. The lungs are clear. Cardiomegaly and coronary artery atherosclerosis again noted. ABDOMEN/PELVIS There is no hypermetabolic activity within the liver, adrenal glands, spleen or pancreas. The spleen size and activity of remain within normal limits. There has been interval enlargement of multiple small retroperitoneal, mesenteric and pelvic lymph nodes bilaterally. Most of the intra- abdominal nodes demonstrate activity similar to blood pool. There are external iliac nodes on the right which are mildly hypermetabolic (SUV max 5.0). There are mildly enlarged and hypermetabolic inguinal lymph nodes bilaterally. An index left inguinal node measures 15 mm on  image 174 and has an SUV max of 4.4. SKELETON There is no hypermetabolic activity to suggest osseous metastatic disease. IMPRESSION: 1. Progressive hypermetabolic adenopathy, primarily involving cervical, axillary, pelvic and inguinal lymph nodes bilaterally, consistent with progressive lymphoma. 2. No solid visceral organ or osseous involvement. Electronically Signed   By: Richardean Sale M.D.   On: 05/27/2017 14:13    ASSESSMENT & PLAN:  CLL (chronic lymphocytic leukemia) ([place]) She tolerated cycle 1 of treatment well except for mild infusion reaction Her white count is still elevated The lymphadenopathy in the head and neck region has subsided somewhat Recommend she continue reduced dose prednisone 10 mg daily until her second dose of treatment She will continue acyclovir for antimicrobial prophylaxis and allopurinol for tumor lysis prophylaxis She is noted to have poor  venous access Previously, we have discussed port placement but she declined  Anemia in neoplastic disease This is likely anemia of chronic disease. The patient denies recent history of bleeding such as epistaxis, hematuria or hematochezia. She is asymptomatic from the anemia. We will observe for now.   Chronic atrial fibrillation (H[facility]) She has persistent chronic atrial fibrillation and is currently rate control on anticoagulation therapy I recommend close follow-up with primary care doctor for medication management   No orders of the defined types were placed in this encounter.  All questions were answered. The patient knows to call the clinic with any problems, questions or concerns. No barriers to learning was detected. I spent 15 minutes counseling the patient face to face. The total time spent in the appointment was 20 minutes and more than 50% was on counseling and review of test results     Heath Lark, MD 06/23/2017 4:25 PM

## 2017-06-26 ENCOUNTER — Ambulatory Visit (INDEPENDENT_AMBULATORY_CARE_PROVIDER_SITE_OTHER): Payer: BLUE CROSS/BLUE SHIELD | Admitting: Cardiovascular Disease

## 2017-06-26 ENCOUNTER — Encounter: Payer: Self-pay | Admitting: Cardiovascular Disease

## 2017-06-26 VITALS — BP 136/78 | HR 80 | Ht 67.0 in | Wt 192.6 lb

## 2017-06-26 DIAGNOSIS — Z6830 Body mass index (BMI) 30.0-30.9, adult: Secondary | ICD-10-CM

## 2017-06-26 DIAGNOSIS — C9190 Lymphoid leukemia, unspecified not having achieved remission: Secondary | ICD-10-CM

## 2017-06-26 DIAGNOSIS — I10 Essential (primary) hypertension: Secondary | ICD-10-CM | POA: Diagnosis not present

## 2017-06-26 DIAGNOSIS — I340 Nonrheumatic mitral (valve) insufficiency: Secondary | ICD-10-CM

## 2017-06-26 DIAGNOSIS — C9110 Chronic lymphocytic leukemia of B-cell type not having achieved remission: Secondary | ICD-10-CM

## 2017-06-26 DIAGNOSIS — I5022 Chronic systolic (congestive) heart failure: Secondary | ICD-10-CM | POA: Diagnosis not present

## 2017-06-26 DIAGNOSIS — I481 Persistent atrial fibrillation: Secondary | ICD-10-CM | POA: Diagnosis not present

## 2017-06-26 DIAGNOSIS — E781 Pure hyperglyceridemia: Secondary | ICD-10-CM | POA: Diagnosis not present

## 2017-06-26 DIAGNOSIS — I4819 Other persistent atrial fibrillation: Secondary | ICD-10-CM

## 2017-06-26 DIAGNOSIS — E6609 Other obesity due to excess calories: Secondary | ICD-10-CM

## 2017-06-26 MED ORDER — LOSARTAN POTASSIUM 25 MG PO TABS: 25.0000 mg | ORAL_TABLET | Freq: Every day | ORAL | 3 refills | Status: DC

## 2017-06-26 NOTE — Progress Notes (Addendum)
Cardiology Office Note    Date:  06/27/2017   ID:  Audrey Gross, DOB March 07, 1961, MRN [id number]  PCP:  Janith Lima, MD  Cardiologist: Sanda Klein, MD   Chief Complaint  Patient presents with  . Follow-up    pt reports shortness of breath on minimal exertion, otherwise, no complaints    History of Present Illness:  Audrey Gross is a 56 y.o. 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   She underwent successful cardioversion for persistent atrial fibrillation on 04/02/2017. No clinical recurrence of atrial fibrillation has been documented since.  She has been well recently. Immediately after the cardioversion she had palpitations off and on for several days, but then they settled down. She has not had any palpitations in the last 2 months. She denies syncope or dizziness. She had shortness of breath maybe 1 or 2 days that resolved spontaneously without adjustment in medications. She has not had angina pectoris.  Repeat echocardiogram continues to show moderate to severely reduced left ventricular systolic function with EF 30-35%, no improvement compared to May 2018. Mitral regurgitation was described as moderate amd the left atrium was moderately dilated ESD 45 mm). Right ventricular systolic function was also mildly to moderately decreased. She is on maximum tolerated dose of carvedilol (the dose was decreased due to symptomatic bradycardia) and takes spironolactone and furosemide. Kerin Ransom reduce her dose of losartan to 25 mg daily, before her cardioversion because of hypotension. Her losartan was stopped altogether later.  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  Tolerating anticoagulation  well, with the exception of menorrhagia. Denies leg edema, claudication, focal neurological events, orthopnea or PND.Becomes easily short of breath with activity.  She has chronic lymphocytic leukemia well compensated on ibrutinib. She has long-standing systemic hypertension that has not always been well treated. She does not have diabetes mellitus, known coronary or peripheral vascular disease. She does have mild hypertriglyceridemia. Recent labs showed normal thyroid function. She is approaching menopause and has had some issues with dysfunctional uterine bleeding and has recently been prescribed medroxyprogesterone.  Past Medical History:  Diagnosis Date  . Atrial fibrillation ([place])   . CLL (chronic lymphoblastic leukemia)    Leukemia  . Cystic breast   . Depression   . Diffuse cystic mastopathy   . Dyslipidemia (high LDL; low HDL)   . Hypertension   . Iron deficiency anemia, unspecified   . Lymphadenopathy of head and neck 04/05/2015  . Medical non-compliance   . Mitral regurgitation 02/2017   moderate to severe    Past Surgical History:  Procedure Laterality Date  . CARDIOVERSION N/A 04/02/2017   Procedure: CARDIOVERSION;  Surgeon: Dorothy Spark, MD;  Location: [facility] ENDOSCOPY;  Service: Cardiovascular;  Laterality: N/A;  . TUBAL LIGATION      Current Medications: Outpatient Medications Prior to Visit  Medication Sig Dispense Refill  . acyclovir (ZOVIRAX) 400 MG tablet Take 1 tablet (400 mg total) by mouth daily. 30 tablet 3  . allopurinol (ZYLOPRIM) 300 MG tablet Take 1 tablet (300 mg total) by mouth daily. 30 tablet 3  . apixaban (ELIQUIS) 5 MG TABS tablet Take 1 tablet (5 mg total) by mouth 2 (two) times daily. 60 tablet 11  . carvedilol (COREG) 12.5 MG tablet Take 6.25 mg  by mouth 2 (two) times daily.     . furosemide (LASIX) 40 MG tablet Take 1 tablet (40 mg total) by mouth daily. 30 tablet 6  . medroxyPROGESTERone (PROVERA) 5 MG tablet TAKE 1 TABLET BY MOUTH DAILY FOR 5  DAYS EVERY OTHER MONTH IF NO SPONTANEOUS MENSES 15 tablet 1  . ondansetron (ZOFRAN) 8 MG tablet Take 1 tablet (8 mg total) by mouth every 8 (eight) hours as needed for refractory nausea / vomiting. 30 tablet 1  . predniSONE (DELTASONE) 10 MG tablet Take 1 tablet (10 mg total) by mouth daily with breakfast. 30 tablet 0  . prochlorperazine (COMPAZINE) 10 MG tablet Take 1 tablet (10 mg total) by mouth every 6 (six) hours as needed (Nausea or vomiting). 30 tablet 1  . spironolactone (ALDACTONE) 25 MG tablet Take 0.5 tablets (12.5 mg total) by mouth daily. 30 tablet 3  . Vitamin D, Cholecalciferol, 1000 units CAPS Take 1,000 Units by mouth every morning.     Marland Kitchen spironolactone (ALDACTONE) 25 MG tablet TAKE 1 TABLET BY MOUTH DAILY (Patient not taking: Reported on 06/26/2017) 90 tablet 1   No facility-administered medications prior to visit.      Allergies:   Patient has no known allergies.   Social History   Social History  . Marital status: Legally Separated    Spouse name: N/A  . Number of children: 6  . 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 Smoking status: Never Smoker  . Smokeless tobacco: Never Used  . Alcohol use No  . Drug use: No  . Sexual activity: Yes    Partners: Male    Birth control/ protection: Surgical   Other Topics Concern  . None   Social History Narrative  . None     Family History:  The patient's family history includes Heart disease in her brother and maternal grandmother; Kidney disease in her father; Stomach cancer in her maternal aunt and paternal grandmother; Sudden death in her mother.   ROS:   Please see the history of present illness.    ROS All other systems reviewed and are negative.   PHYSICAL EXAM:   VS:  BP 136/78   Pulse 80   Ht 5\' 7"  (1.702 m)   Wt 192 lb 9.6 oz (87.4 kg)   BMI 30.17 kg/m     General: Alert, oriented x3, no distress, Looks worried Head: no evidence of trauma,  PERRL, EOMI, no exophtalmos or lid lag, no myxedema, no xanthelasma; normal ears, nose and oropharynx Neck: normal jugular venous pulsations and no hepatojugular reflux; brisk carotid pulses without delay and no carotid bruits Chest: clear to auscultation, no signs of consolidation by percussion or palpation, normal fremitus, symmetrical and full respiratory excursions Cardiovascular: normal position and quality of the apical impulse, regular rhythm, normal first and second heart sounds, no murmurs, rubs or gallops Abdomen: no tenderness or distention, no masses by palpation, no abnormal pulsatility or arterial bruits, normal bowel sounds, no hepatosplenomegaly Extremities: no clubbing, cyanosis or edema; 2+ radial, ulnar and brachial pulses bilaterally; 2+ right femoral, posterior tibial and dorsalis pedis pulses; 2+ left femoral, posterior tibial and dorsalis pedis pulses; no subclavian or femoral bruits Neurological: grossly nonfocal Psych: Normal mood and affect   Wt Readings from Last 3 Encounters:  06/26/17 192 lb 9.6 oz (87.4 kg)  06/23/17 192 lb 6.4 oz (87.3 kg)  06/11/17 188 lb 11.2 oz (85.6 kg)  Studies/Labs Reviewed:   EKG:  The ekg is not ordered today.  ECHO: Moderate left ventricular hypertrophy, EF 55-60%, no significant valvular abnormalities.  I have some disagreements with the report. The left atrial size is described as "normal" but both left atrial end-systolic diameter and left atrial volume index are consistent with a mildly dilated left atrium. In addition the diastolic function is described as normal whereas in my interpretation the values are more consistent with pseudonormalization (although the E/e' is in the gray zone). The report measurement section erroneously lists a TR velocity of over 500 cm/s. This was a measurement of the MR jet  Recent Labs: 02/26/2017: B Natriuretic Peptide 207.9 03/23/2017: Magnesium 2.0; TSH 0.519 06/23/2017: ALT 34; BUN 14.4;  Creatinine 0.9; HGB 9.8; Platelets 235; Potassium 3.6; Sodium 145   Lipid Panel    Component Value Date/Time   CHOL 177 06/11/2016 1206   TRIG 181.0 (H) 06/11/2016 1206   HDL 44.50 06/11/2016 1206   CHOLHDL 4 06/11/2016 1206   VLDL 36.2 06/11/2016 1206   LDLCALC 96 06/11/2016 1206          ASSESSMENT:    1. Persistent atrial fibrillation ([place])   2. Essential hypertension   3. Chronic systolic heart failure ([place])   4. Mitral valve insufficiency, unspecified etiology   5. CLL (chronic lymphocytic leukemia) ([place])   6. Hypertriglyceridemia without hypercholesterolemia   7. Class 1 obesity due to excess calories with serious comorbidity and body mass index (BMI) of 30.0 to 30.9 in adult      PLAN:  In order of problems listed above:  1. AFib: Successful cardioversion almost 3 months ago. Despite no clinical recurrence of tachyarrhythmia, LVEF remains moderately depressed. I still think we may have to consider ablation. Her Holter monitor was very suggestive of pulmonary vein tachycardia. 2. HTN: Excellent control on carvedilol without significant/symptomatic bradycardia 3. CHF: Still has no clinical evidence of hypervolemia, but LVEF remains moderately depressed. 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 I think we have to make sure that she does not have coronary disease, as unlikely as that would appear in this premenopausal woman without angina and with very limited risk factors. Coronary CT angiography obtained is our best option, so that we do not have to interrupt anticoagulation. Would also give [place] the opportunity to assess her left atrial anatomy. We'll try to start ARB again. Looked over her labs and did not see any evidence of hyperkalemia or renal insufficiency that should have prompted its discontinuation. Her blood pressure definitely allows losartan 25 mg daily. Ideally, in the long-term she would be on Entresto. 4. MR: Suspect this is  secondary to the cardiomyopathy, rather than causal 5. CLL: appears to be well treated on a well-tolerated biological agent, periodic infusions with Dr. Alvy Bimler. She has mild anemia, but no thrombocytopenia. 6. HLP: Mild, managed with diet and exercise. Not on medications. 7. Obesity: Weight loss and regular exercise are recommended.    Medication Adjustments/Labs and Tests Ordered: Current medicines are reviewed at length with the patient today.  Concerns regarding medicines are outlined above.  Medication changes, Labs and Tests ordered today are listed in the Patient Instructions below. Patient Instructions  Dr Sallyanne Kuster has recommended making the following medication changes: 1. START Losartan 25 mg - take 1 tablet by mouth daily  Your physician recommends that you schedule a follow-up appointment in 3 months with Dr C.  Dr Sallyanne Kuster recommends that you schedule a follow-up appointment in 2 weeks  with one of our clinical pharmacists for medication management. To either increase Losartan OR transition to [employer].  If you need a refill on your cardiac medications before your next appointment, please call your pharmacy.  Dr Sallyanne Kuster has referred you to an electrophysiologist, Dr Curt Bears for consideration of atrial fibrillation ablation.   Cardiac Ablation Cardiac ablation is a procedure to stop some heart tissue from causing problems. The heart has many electrical connections. Sometimes these connections make the heart beat very fast or irregularly. Removing some problem areas can improve the heart rhythm or make it normal. What happens before the procedure?  Follow instructions from your doctor about what you cannot eat or drink.  Ask your doctor about: ? Changing or stopping your normal medicines. This is important if you take diabetes medicines or blood thinners. ? Taking medicines such as aspirin and ibuprofen. These medicines can thin your blood. Do not take these medicines before  your procedure if your doctor tells you not to.  Plan to have someone take you home.  If you will be going home right after the procedure, plan to have someone with you for 24 hours. What happens during the procedure?  To lower your risk of infection: ? Your health care team will wash or sanitize their hands. ? Your skin will be washed with soap. ? Hair may be removed from your neck or groin.  An IV tube will be put into one of your veins.  You will be given a medicine to help you relax (sedative).  Skin on your neck or groin will be numbed.  A cut (incision) will be made in your neck or groin.  A needle will be put through your cut and into a vein in your neck or groin.  A tube (catheter) will be put into the needle. The tube will be moved to your heart. X-rays (fluoroscopy) will be used to help guide the tube.  Small devices (electrodes) on the tip of the tube will send out electrical currents.  Dye may be put through the tube. This helps your surgeon see your heart.  Electrical energy will be used to scar (ablate) some heart tissue. Your surgeon may use: ? Heat (radiofrequency energy). ? Laser energy. ? Extreme cold (cryoablation).  The tube will be taken out.  Pressure will be held on your cut. This helps stop bleeding.  A bandage (dressing) will be put on your cut. The procedure may vary. What happens after the procedure?  You will be monitored until your medicines have worn off.  Your cut will be watched for bleeding. You will need to lie still for a few hours.  Do not drive for 24 hours or as long as your doctor tells you. Summary  Cardiac ablation is a procedure to stop some heart tissue from causing problems.  Electrical energy will be used to scar (ablate) some heart tissue. This information is not intended to replace advice given to you by your health care provider. Make sure you discuss any questions you have with your health care provider. Document  Released: 05/25/2013 Document Revised: 08/11/2016 Document Reviewed: 08/11/2016 Elsevier Interactive Patient Education  2017 [place], Sanda Klein, MD  06/27/2017 5:03 PM    [facility] [address], [place], [place]  [address] Phone: [phone]; Fax: [phone]

## 2017-06-26 NOTE — Patient Instructions (Addendum)
Dr Sallyanne Kuster has recommended making the following medication changes: 1. START Losartan 25 mg - take 1 tablet by mouth daily  Your physician recommends that you schedule a follow-up appointment in 3 months with Dr C.  Dr Sallyanne Kuster recommends that you schedule a follow-up appointment in 2 weeks with one of our clinical pharmacists for medication management. To either increase Losartan OR transition to [employer].  If you need a refill on your cardiac medications before your next appointment, please call your pharmacy.  Dr Sallyanne Kuster has referred you to an electrophysiologist, Dr Curt Bears for consideration of atrial fibrillation ablation.   Cardiac Ablation Cardiac ablation is a procedure to stop some heart tissue from causing problems. The heart has many electrical connections. Sometimes these connections make the heart beat very fast or irregularly. Removing some problem areas can improve the heart rhythm or make it normal. What happens before the procedure?  Follow instructions from your doctor about what you cannot eat or drink.  Ask your doctor about: ? Changing or stopping your normal medicines. This is important if you take diabetes medicines or blood thinners. ? Taking medicines such as aspirin and ibuprofen. These medicines can thin your blood. Do not take these medicines before your procedure if your doctor tells you not to.  Plan to have someone take you home.  If you will be going home right after the procedure, plan to have someone with you for 24 hours. What happens during the procedure?  To lower your risk of infection: ? Your health care team will wash or sanitize their hands. ? Your skin will be washed with soap. ? Hair may be removed from your neck or groin.  An IV tube will be put into one of your veins.  You will be given a medicine to help you relax (sedative).  Skin on your neck or groin will be numbed.  A cut (incision) will be made in your neck or groin.  A  needle will be put through your cut and into a vein in your neck or groin.  A tube (catheter) will be put into the needle. The tube will be moved to your heart. X-rays (fluoroscopy) will be used to help guide the tube.  Small devices (electrodes) on the tip of the tube will send out electrical currents.  Dye may be put through the tube. This helps your surgeon see your heart.  Electrical energy will be used to scar (ablate) some heart tissue. Your surgeon may use: ? Heat (radiofrequency energy). ? Laser energy. ? Extreme cold (cryoablation).  The tube will be taken out.  Pressure will be held on your cut. This helps stop bleeding.  A bandage (dressing) will be put on your cut. The procedure may vary. What happens after the procedure?  You will be monitored until your medicines have worn off.  Your cut will be watched for bleeding. You will need to lie still for a few hours.  Do not drive for 24 hours or as long as your doctor tells you. Summary  Cardiac ablation is a procedure to stop some heart tissue from causing problems.  Electrical energy will be used to scar (ablate) some heart tissue. This information is not intended to replace advice given to you by your health care provider. Make sure you discuss any questions you have with your health care provider. Document Released: 05/25/2013 Document Revised: 08/11/2016 Document Reviewed: 08/11/2016 Elsevier Interactive Patient Education  2017 [employer].

## 2017-06-27 DIAGNOSIS — I5022 Chronic systolic (congestive) heart failure: Secondary | ICD-10-CM | POA: Insufficient documentation

## 2017-06-29 ENCOUNTER — Other Ambulatory Visit: Payer: Self-pay | Admitting: Internal Medicine

## 2017-06-29 ENCOUNTER — Telehealth: Payer: Self-pay

## 2017-06-29 DIAGNOSIS — Z1239 Encounter for other screening for malignant neoplasm of breast: Secondary | ICD-10-CM

## 2017-06-30 ENCOUNTER — Ambulatory Visit (INDEPENDENT_AMBULATORY_CARE_PROVIDER_SITE_OTHER): Payer: BLUE CROSS/BLUE SHIELD | Admitting: General Practice

## 2017-06-30 ENCOUNTER — Telehealth: Payer: Self-pay | Admitting: Hematology and Oncology

## 2017-06-30 DIAGNOSIS — Z23 Encounter for immunization: Secondary | ICD-10-CM

## 2017-06-30 NOTE — Telephone Encounter (Signed)
Spoke to pt to advise that completed FMLA/Disability forms are ready for pickup in reception area. Pt states that she will come by to pick them up

## 2017-07-02 ENCOUNTER — Other Ambulatory Visit: Payer: Self-pay | Admitting: Hematology and Oncology

## 2017-07-02 ENCOUNTER — Ambulatory Visit
Admission: RE | Admit: 2017-07-02 | Discharge: 2017-07-02 | Disposition: A | Discharge status: Home / Self Care | Payer: BLUE CROSS/BLUE SHIELD | Source: Ambulatory Visit | Attending: Internal Medicine | Admitting: Internal Medicine

## 2017-07-02 DIAGNOSIS — Z1231 Encounter for screening mammogram for malignant neoplasm of breast: Secondary | ICD-10-CM | POA: Diagnosis not present

## 2017-07-02 DIAGNOSIS — Z1239 Encounter for other screening for malignant neoplasm of breast: Secondary | ICD-10-CM

## 2017-07-02 LAB — HM MAMMOGRAPHY

## 2017-07-03 ENCOUNTER — Other Ambulatory Visit: Payer: Self-pay | Admitting: Internal Medicine

## 2017-07-03 DIAGNOSIS — R928 Other abnormal and inconclusive findings on diagnostic imaging of breast: Secondary | ICD-10-CM

## 2017-07-05 NOTE — Progress Notes (Signed)
Electrophysiology Office Note   Date:  07/06/2017   ID:  Audrey Gross, DOB 1961/03/13, MRN [id number]  PCP:  Janith Lima, Gross  Cardiologist:  [place] Primary Electrophysiologist:  Christyn Gutkowski Meredith Leeds, Gross    Chief Complaint  Patient presents with  . Advice Only    Afib     History of Present Illness: Audrey Gross is a 56 y.o. female who is being seen today for the evaluation of atrial fibrillaiton at the request of Audrey Gross. Presenting today for electrophysiology evaluation. She has a history of CLL, asymptomatic left ventricular dysfunction, and paroxysmal atrial fibrillation possibly related to a pulmonary vein trigger. She had a cardioversion for persistent atrial fibrillation on 04/02/17 with no clinical recurrence. Echocardiogram done September 2018 showed an EF of 30-35% consistent with an echo in May 2018. Left atrium is moderately dilated at 45 mm.    Today, she denies symptoms of chest pain, orthopnea, PND, lower extremity edema, claudication, dizziness, presyncope, syncope, bleeding, or neurologic sequela. The patient is tolerating medications without difficulties. Her main complaint is of fatigue, weakness, and shortness of breath. She's been feeling this way for the last few weeks. She has had cardioversions in the past that if maintained sinus rhythm for a short period of time. He is currently undergoing chemotherapy for CLL with her next dose on October 10.  Past Medical History:  Diagnosis Date  . Atrial fibrillation ([place])   . CLL (chronic lymphoblastic leukemia)    Leukemia  . Cystic breast   . Depression   . Diffuse cystic mastopathy   . Dyslipidemia (high LDL; low HDL)   . Hypertension   . Iron deficiency anemia, unspecified   . Lymphadenopathy of head and neck 04/05/2015  . Medical non-compliance   . Mitral regurgitation 02/2017   moderate to severe   Past Surgical History:  Procedure Laterality Date  . CARDIOVERSION N/A 04/02/2017   Procedure: CARDIOVERSION;  Surgeon: Dorothy Spark, Gross;  Location: [facility] ENDOSCOPY;  Service: Cardiovascular;  Laterality: N/A;  . TUBAL LIGATION       Current Outpatient Prescriptions  Medication Sig Dispense Refill  . acyclovir (ZOVIRAX) 400 MG tablet Take 1 tablet (400 mg total) by mouth daily. 30 tablet 3  . allopurinol (ZYLOPRIM) 300 MG tablet Take 1 tablet (300 mg total) by mouth daily. 30 tablet 3  . apixaban (ELIQUIS) 5 MG TABS tablet Take 1 tablet (5 mg total) by mouth 2 (two) times daily. 60 tablet 11  . carvedilol (COREG) 12.5 MG tablet Take 6.25 mg by mouth 2 (two) times daily.     . furosemide (LASIX) 40 MG tablet Take 1 tablet (40 mg total) by mouth daily. 30 tablet 6  . losartan (COZAAR) 25 MG tablet Take 1 tablet (25 mg total) by mouth daily. 90 tablet 3  . medroxyPROGESTERone (PROVERA) 5 MG tablet TAKE 1 TABLET BY MOUTH DAILY FOR 5 DAYS EVERY OTHER MONTH IF NO SPONTANEOUS MENSES 15 tablet 1  . ondansetron (ZOFRAN) 8 MG tablet Take 1 tablet (8 mg total) by mouth every 8 (eight) hours as needed for refractory nausea / vomiting. 30 tablet 1  . predniSONE (DELTASONE) 10 MG tablet TAKE 1 TABLET BY MOUTH DAILY WITH BREAKFAST 30 tablet 0  . prochlorperazine (COMPAZINE) 10 MG tablet Take 1 tablet (10 mg total) by mouth every 6 (six) hours as needed (Nausea or vomiting). 30 tablet 1  . spironolactone (ALDACTONE) 25 MG tablet Take 0.5 tablets (12.5 mg total) by  mouth daily. 30 tablet 3  . Vitamin D, Cholecalciferol, 1000 units CAPS Take 1,000 Units by mouth every morning.      No current facility-administered medications for this visit.     Allergies:   Patient has no known allergies.   Social History:  The patient  reports that she has never smoked. She has never used smokeless tobacco. She reports that she does not drink alcohol or use drugs.   Family History:  The patient's family history includes Heart disease in her brother and maternal grandmother; Kidney disease in her  father; Stomach cancer in her maternal aunt and paternal grandmother; Sudden death in her mother.    ROS:  Please see the history of present illness.   Otherwise, review of systems is positive for Sweats, fatigue, dyspnea on exertion, diarrhea, depression.   All other systems are reviewed and negative.    PHYSICAL EXAM: VS:  BP 130/80   Pulse (!) 137   Ht 5\' 7"  (1.702 m)   Wt 193 lb 12.8 oz (87.9 kg)   BMI 30.35 kg/m  , BMI Body mass index is 30.35 kg/m. GEN: Well nourished, well developed, in no acute distress  HEENT: normal  Neck: no JVD, carotid bruits, or masses Cardiac: iRRR; no murmurs, rubs, or gallops,no edema  Respiratory:  clear to auscultation bilaterally, normal work of breathing GI: soft, nontender, nondistended, + BS MS: no deformity or atrophy  Skin: warm and dry Neuro:  Strength and sensation are intact Psych: euthymic mood, full affect  EKG:  EKG is ordered today. Personal review of the ekg ordered shows atrial fibrillation, rate 137, LVH by voltage  Recent Labs: 02/26/2017: B Natriuretic Peptide 207.9 03/23/2017: Magnesium 2.0; TSH 0.519 06/23/2017: ALT 34; BUN 14.4; Creatinine 0.9; HGB 9.8; Platelets 235; Potassium 3.6; Sodium 145    Lipid Panel     Component Value Date/Time   CHOL 182 02/27/2017 0432   TRIG 135 02/27/2017 0432   HDL 34 (L) 02/27/2017 0432   CHOLHDL 5.4 02/27/2017 0432   VLDL 27 02/27/2017 0432   LDLCALC 121 (H) 02/27/2017 0432   LDLDIRECT 144.5 07/26/2013 0934     Wt Readings from Last 3 Encounters:  07/06/17 193 lb 12.8 oz (87.9 kg)  06/26/17 192 lb 9.6 oz (87.4 kg)  06/23/17 192 lb 6.4 oz (87.3 kg)      Other studies Reviewed: Additional studies/ records that were reviewed today include: TTE 06/19/17  Review of the above records today demonstrates:  - Left ventricle: LVEF is depressed at approxiamately 35% with   severe hypokinesis of tine inferior and septal walls The cavity   size was normal. Wall thickness was increased  in a pattern of   mild LVH. Systolic function was moderately to severely reduced.   The estimated ejection fraction was in the range of 30% to 35%. - Mitral valve: There was moderate regurgitation. - Left atrium: The atrium was moderately dilated. - Right ventricle: Systolic function was mildly to moderately   reduced.   ASSESSMENT AND PLAN:  1.  Persistent atrial fibrillation: Anticoagulation with Eliquis. Recent cardioversion to sinus rhythm.She returns to clinic today in atrial fibrillation with rapid rates in the 137 bpm. We'll plan to increase her carvedilol to 12.5 mg. She is hesitant to increase carvedilol further. We Sidnee Gambrill call her in 2 weeks to determine if she needs a further increase of her beta blocker. I did discuss with her options returning to sinus rhythm including ablation and medical management. Her QTC  in sinus rhythm is 406 and thus she would likely be okay with Tikosyn. Also discussed amiodarone and ablation. Risks and benefits of ablation include bleeding, tamponade, heart block, stroke, and damage to surrounding organs. She understands these risks and would like to think about further therapies.  This patients CHA2DS2-VASc Score and unadjusted Ischemic Stroke Rate (% per year) is equal to 3.2 % stroke rate/year from a score of 3  Above score calculated as 1 point each if present [CHF, HTN, DM, Vascular=MI/PAD/Aortic Plaque, Age if 65-74, or Female] Above score calculated as 2 points each if present [Age > 75, or Stroke/TIA/TE]  2. Essential hypertension: Currently well controlled with carvedilol and losartan.  3. Chronic systolic heart failure: Ejection fraction decreased on most recent echo consistent with echo done in May. Currently on carvedilol and losartan  4. Hyperlipidemia: Currently managed with diet and exercise  5. Obesity, Snoring: Sleeps poorly but does snore and stops breathing from time to time. We'll plan to order a sleep study.   Current medicines are  reviewed at length with the patient today.   The patient does not have concerns regarding her medicines.  The following changes were made today:  Increase coreg  Labs/ tests ordered today include:  Orders Placed This Encounter  Procedures  . EKG 12-Lead     Disposition:   FU with Brittinie Wherley 3 months  Signed, Franshesca Chipman Meredith Leeds, Gross  07/06/2017 9:35 AM     [facility] [address] [place] [address] [phone] (office) [phone] (fax)

## 2017-07-06 ENCOUNTER — Ambulatory Visit (INDEPENDENT_AMBULATORY_CARE_PROVIDER_SITE_OTHER): Payer: BLUE CROSS/BLUE SHIELD | Admitting: Cardiology

## 2017-07-06 ENCOUNTER — Encounter: Payer: Self-pay | Admitting: Cardiology

## 2017-07-06 VITALS — BP 130/80 | HR 137 | Ht 67.0 in | Wt 193.8 lb

## 2017-07-06 DIAGNOSIS — I481 Persistent atrial fibrillation: Secondary | ICD-10-CM

## 2017-07-06 DIAGNOSIS — E785 Hyperlipidemia, unspecified: Secondary | ICD-10-CM

## 2017-07-06 DIAGNOSIS — I10 Essential (primary) hypertension: Secondary | ICD-10-CM

## 2017-07-06 DIAGNOSIS — R400 Somnolence: Secondary | ICD-10-CM | POA: Diagnosis not present

## 2017-07-06 DIAGNOSIS — I482 Chronic atrial fibrillation, unspecified: Secondary | ICD-10-CM

## 2017-07-06 DIAGNOSIS — R0683 Snoring: Secondary | ICD-10-CM

## 2017-07-06 DIAGNOSIS — I4819 Other persistent atrial fibrillation: Secondary | ICD-10-CM

## 2017-07-06 DIAGNOSIS — I428 Other cardiomyopathies: Secondary | ICD-10-CM | POA: Diagnosis not present

## 2017-07-06 MED ORDER — CARVEDILOL 12.5 MG PO TABS: 12.5000 mg | ORAL_TABLET | Freq: Two times a day (BID) | ORAL | 1 refills | Status: DC

## 2017-07-06 NOTE — Progress Notes (Signed)
Thanks for trying, Will. MCr

## 2017-07-06 NOTE — Addendum Note (Signed)
Addended by: Stanton Kidney on: 07/06/2017 09:56 AM   Modules accepted: Orders

## 2017-07-06 NOTE — Addendum Note (Signed)
Addended by: Stanton Kidney on: 07/06/2017 02:03 PM   Modules accepted: Orders

## 2017-07-06 NOTE — Patient Instructions (Addendum)
Medication Instructions:  Your physician has recommended you make the following change in your medication:  1. INCREASE Carvedilol to 12.5 mg twice daily  -- If you need a refill on your cardiac medications before your next appointment, please call your pharmacy. --  Labwork: None ordered  Testing/Procedures: Your physician has recommended that you have a sleep study. This test records several body functions during sleep, including: brain activity, eye movement, oxygen and carbon dioxide blood levels, heart rate and rhythm, breathing rate and rhythm, the flow of air through your mouth and nose, snoring, body muscle movements, and chest and belly movement.  Follow-Up: Your physician recommends that you schedule a follow-up appointment in: 3 months with Dr. Curt Bears.  Thank you for choosing [facility]!!   Trinidad Curet, RN [phone]  Any Other Special Instructions Will Be Listed Below (If Applicable).  Nurse will call you in several week to see how your heart rates are doing on the increased Carvedilol medication.  Will also check to see if you are interested in ablation.   Sleep Studies A sleep study (polysomnogram) is a series of tests done while you are sleeping. It can show how well you sleep. This can help your health care provider diagnose a sleep disorder and show how severe your sleep disorder is. A sleep study may lead to treatment that will help you sleep better and prevent other medical problems caused by poor sleep. If you have a sleep disorder, you may also be at risk for:  Sleep-related accidents.  High blood pressure.  Heart disease.  Stroke.  Other medical conditions.  Sleep disorders are common. Your health care provider may suspect a sleep disorder if you:  Have loud snoring most nights.  Have brief periods when you stop breathing at night.  Feel sleepy on most days.  Fall asleep suddenly during the day.  Have trouble falling asleep or staying  asleep.  Feel like you need to move your legs when trying to fall asleep.  Have dreams that seem very real shortly after falling asleep.  Feel like you cannot move when you first wake up.  Which tests will I need to have? Most sleep studies last all night and include these tests:  Recordings of your brain activity.  Recordings of your eye movements.  Recording of your heart rate and rhythm.  Blood pressure readings.  Readings of the amount of oxygen in your blood.  Measurements of your chest and belly movement as you breathe during sleep.  If you have signs of the sleep disorder called sleep apnea during your test, you may get a mask to wear for the second half of the night.  The mask provides continuous positive airway pressure (CPAP). This may improve sleep apnea significantly.  You will then have all tests done again with the mask in place to see if your measurements and recordings change.  How are sleep studies done? Most sleep studies are done over one full night of sleep.  You will arrive at the study center in the evening and can go home in the morning.  Bring your pajamas and toothbrush.  Do not have caffeine on the day of your sleep study.  Your health care provider will let you know if you need to stop taking any of your regular medicines before the test.  To do the tests included in a polysomnogram, you will have:  Round, sticky patches with sensors attached to recording wires (electrodes) placed on your scalp, face,  chest, and limbs.  Wires from all the electrodes and sensors run from your bed to a computer. The wires can be taken off and put back on if you need to get out of bed to go to the bathroom.  A sensor placed over your nose to measure airflow.  A finger clip put on one finger to measure your blood oxygen level.  A belt around your belly and a belt around your chest to measure breathing movements.  Where are sleep studies done? Sleep studies  are done at sleep centers. A sleep center may be inside a hospital, office, or clinic. The room where you have the study may look like a hospital room or a hotel room. The health care providers doing the study may come in and out of the room during the study. Most of the time, they will be in another room monitoring your test. How is information from sleep studies helpful? A polysomnogram can be used along with your medical history and a physical exam to diagnose conditions, such as:  Sleep apnea.  Restless legs syndrome.  Sleep-related seizure disorders.  Sleep-related movement disorders.  A medical doctor who specializes in sleep will evaluate your sleep study. The specialist will share the results with your primary health care provider. Treatments based on your sleep study may include:  Improving your sleep habits (sleep hygiene).  Wearing a CPAP mask.  Wearing an oral device at night to improve breathing and reduce snoring.  Taking medicine for: ? Restless legs syndrome. ? Sleep-related seizure disorder. ? Sleep-related movement disorder.  This information is not intended to replace advice given to you by your health care provider. Make sure you discuss any questions you have with your health care provider. Document Released: 03/29/2003 Document Revised: 05/18/2016 Document Reviewed: 11/28/2013 Elsevier Interactive Patient Education  [employer].

## 2017-07-10 ENCOUNTER — Telehealth: Payer: Self-pay | Admitting: *Deleted

## 2017-07-10 NOTE — Telephone Encounter (Signed)
-----   Message from Stanton Kidney, RN sent at 07/06/2017  1:54 PM EDT ----- Regarding: Sleep Study  (EP score) Please pre cert and arrange sleep study.  Epworth Scale: 1.  0 2.  2 3.  2 4.  3 5.  3 6.  0 7.  2 8.  1 ------------------------- Total = 13

## 2017-07-10 NOTE — Telephone Encounter (Signed)
Informed patient of upcoming sleep study and patient understanding was verbalized. Patient understands her sleep study is scheduled for Sunday August 30 2017. Patient understands her sleep study will be done at [facility] sleep lab. Patient understands she will receive a sleep packet in a week or so. Patient understands to call if she does not receive the sleep packet in a timely manner. Patient agrees with treatment and thanked me for call.

## 2017-07-13 ENCOUNTER — Encounter: Payer: Self-pay | Admitting: Pharmacist Clinician (PhC)/ Clinical Pharmacy Specialist

## 2017-07-13 ENCOUNTER — Encounter: Payer: Self-pay | Admitting: Obstetrics and Gynecology

## 2017-07-13 ENCOUNTER — Ambulatory Visit (INDEPENDENT_AMBULATORY_CARE_PROVIDER_SITE_OTHER): Payer: BLUE CROSS/BLUE SHIELD | Admitting: Pharmacist Clinician (PhC)/ Clinical Pharmacy Specialist

## 2017-07-13 ENCOUNTER — Ambulatory Visit (INDEPENDENT_AMBULATORY_CARE_PROVIDER_SITE_OTHER): Payer: BLUE CROSS/BLUE SHIELD | Admitting: Obstetrics and Gynecology

## 2017-07-13 VITALS — BP 118/70 | HR 64 | Resp 16 | Wt 191.0 lb

## 2017-07-13 DIAGNOSIS — I4891 Unspecified atrial fibrillation: Secondary | ICD-10-CM

## 2017-07-13 DIAGNOSIS — Z01419 Encounter for gynecological examination (general) (routine) without abnormal findings: Secondary | ICD-10-CM | POA: Diagnosis not present

## 2017-07-13 DIAGNOSIS — Z856 Personal history of leukemia: Secondary | ICD-10-CM

## 2017-07-13 DIAGNOSIS — K602 Anal fissure, unspecified: Secondary | ICD-10-CM | POA: Diagnosis not present

## 2017-07-13 DIAGNOSIS — K921 Melena: Secondary | ICD-10-CM | POA: Diagnosis not present

## 2017-07-13 DIAGNOSIS — I5022 Chronic systolic (congestive) heart failure: Secondary | ICD-10-CM

## 2017-07-13 NOTE — Progress Notes (Signed)
56 y.o. [id number] Legally SeparatedAfrican AmericanF here for annual exam. Patient c/o rectal bleeding X 2 days. She thinks she has hemorrhoids, not tender. Blood in the toilet and on the paper. May have had blood in the stool. She has been having diarrhea. Having 5-6 BM a day for the last few days. Typically she has a BM after every meal (30-60 minutes after eating).  No cycle since May, 2018 (prior was in February). Not sexually active. Husband just moved back in to help.  Declines treatment for depression. Tolerable, no current thoughts of hurting herself or others.     Patient's last menstrual period was 02/03/2017 (approximate).          Sexually active: No.  The current method of family planning is tubal ligation.    Exercising: No.  The patient does not participate in regular exercise at present. Smoker:  no  Health Maintenance: Pap:  07-10-16 WNL NEG HR HPV  History of abnormal Pap:  no MMG:  07-02-17 needs additional imaging  Colonoscopy:  04-07-12 Normal per patient  BMD:   Never TDaP:  06-21-12 Gardasil: N/A    reports that she has never smoked. She has never used smokeless tobacco. She reports that she does not drink alcohol or use drugs. Shs has 5 daughters, ranging in age from 67 to 33. She just stopped working because of the CLL last month.   Past Medical History:  Diagnosis Date  . Atrial fibrillation ([place])   . CLL (chronic lymphoblastic leukemia)    Leukemia  . Cystic breast   . Depression   . Diffuse cystic mastopathy   . Dyslipidemia (high LDL; low HDL)   . Hypertension   . Iron deficiency anemia, unspecified   . Lymphadenopathy of head and neck 04/05/2015  . Medical non-compliance   . Mitral regurgitation 02/2017   moderate to severe    Past Surgical History:  Procedure Laterality Date  . CARDIOVERSION N/A 04/02/2017   Procedure: CARDIOVERSION;  Surgeon: Dorothy Spark, MD;  Location: [facility] ENDOSCOPY;  Service: Cardiovascular;  Laterality: N/A;  . TUBAL  LIGATION      Current Outpatient Prescriptions  Medication Sig Dispense Refill  . acyclovir (ZOVIRAX) 400 MG tablet Take 1 tablet (400 mg total) by mouth daily. 30 tablet 3  . allopurinol (ZYLOPRIM) 300 MG tablet Take 1 tablet (300 mg total) by mouth daily. 30 tablet 3  . apixaban (ELIQUIS) 5 MG TABS tablet Take 1 tablet (5 mg total) by mouth 2 (two) times daily. 60 tablet 11  . carvedilol (COREG) 12.5 MG tablet Take 1 tablet (12.5 mg total) by mouth 2 (two) times daily. 180 tablet 1  . furosemide (LASIX) 40 MG tablet Take 1 tablet (40 mg total) by mouth daily. 30 tablet 6  . losartan (COZAAR) 25 MG tablet Take 1 tablet (25 mg total) by mouth daily. 90 tablet 3  . medroxyPROGESTERone (PROVERA) 5 MG tablet TAKE 1 TABLET BY MOUTH DAILY FOR 5 DAYS EVERY OTHER MONTH IF NO SPONTANEOUS MENSES 15 tablet 1  . ondansetron (ZOFRAN) 8 MG tablet Take 1 tablet (8 mg total) by mouth every 8 (eight) hours as needed for refractory nausea / vomiting. 30 tablet 1  . predniSONE (DELTASONE) 10 MG tablet TAKE 1 TABLET BY MOUTH DAILY WITH BREAKFAST 30 tablet 0  . prochlorperazine (COMPAZINE) 10 MG tablet Take 1 tablet (10 mg total) by mouth every 6 (six) hours as needed (Nausea or vomiting). 30 tablet 1  . spironolactone (ALDACTONE) 25 MG  tablet Take 0.5 tablets (12.5 mg total) by mouth daily. 30 tablet 3  . Vitamin D, Cholecalciferol, 1000 units CAPS Take 1,000 Units by mouth every morning.      No current facility-administered medications for this visit.     Family History  Problem Relation Age of Onset  . Sudden death Mother   . Kidney disease Father        H/O HD and kidney transplant  . Stomach cancer Maternal Aunt   . Stomach cancer Paternal Grandmother   . Heart disease Brother   . Heart disease Maternal Grandmother     Review of Systems  Constitutional: Negative.   HENT: Negative.   Eyes: Negative.   Respiratory: Negative.   Cardiovascular: Negative.   Gastrointestinal: Positive for anal  bleeding.  Endocrine: Negative.   Genitourinary: Negative.   Musculoskeletal: Negative.   Skin: Negative.   Allergic/Immunologic: Negative.   Neurological: Negative.   Psychiatric/Behavioral: Negative.     Exam:   BP 118/70 (BP Location: Right Arm, Patient Position: Sitting, Cuff Size: Normal)   Pulse 64   Resp 16   Wt 191 lb (86.6 kg)   LMP 02/03/2017 (Approximate)   BMI 29.91 kg/m   Weight change: @WEIGHTCHANGE @ Height:      Ht Readings from Last 3 Encounters:  07/06/17 5\' 7"  (1.702 m)  06/26/17 5\' 7"  (1.702 m)  06/23/17 5\' 7"  (1.702 m)    General appearance: alert, cooperative and appears stated age Head: Normocephalic, without obvious abnormality, atraumatic Neck: no adenopathy, supple, symmetrical, trachea midline and thyroid normal to inspection and palpation Lungs: clear to auscultation bilaterally Cardiovascular:irregularly irregular, c/w her h/o afib Breasts: normal appearance, no masses or tenderness Abdomen: soft, non-tender; non distended,  no masses,  no organomegaly Extremities: extremities normal, atraumatic, no cyanosis or edema Skin: Skin color, texture, turgor normal. No rashes or lesions Lymph nodes: Cervical, supraclavicular, and axillary nodes normal. No abnormal inguinal nodes palpated Neurologic: Grossly normal   Pelvic: External genitalia:  no lesions              Urethra:  normal appearing urethra with no masses, tenderness or lesions              Bartholins and Skenes: normal                 Vagina: normal appearing atrophic vagina with normal color and discharge, no lesions              Cervix: no lesions               Bimanual Exam:  Uterus:  normal size, contour, position, consistency, mobility, non-tender              Adnexa: no mass, fullness, tenderness               Rectovaginal: Confirms               Anus:  normal sphincter tone, small anal fissure at 1 o'clock, + anal tags, no hemorrhoids  Chaperone was present for exam.  A:  Well  Woman with normal exam  Hematochezia, ? Melena. Small anal fissure noted on exam  CLL she is doing chemo 2 x a week one time a month. Started last month. Trying to get her into remission.   She c/o fatigue, occasionally lightheaded. I told her that these symptoms could be from her cancer or from her a.fib, I encouraged her to seek f/u with her primary (she declines)  She has an irregularly irregular heart rate today  She is seeing her Oncologist on Wednesday  Depressed mood, declines counseling or medication. Denies current suicidal ideation  P:   Recommended she f/u with Dr Ardis Hughs in GI  Recommended she f/u with her primary  F/U with Oncology in 2 days  Pap up to date  Mammogram, needs to go back for additional imaging  Labs with primary and Oncology   CC: Dr Owens Loffler, Dr [name], Dr Sanda Klein, Dr Heath Lark

## 2017-07-13 NOTE — Progress Notes (Signed)
07/13/2017 EVAN OSBURN 10/29/1960 [id number]   HPI:  Audrey Gross is a 56 y.o. female patient of Dr Sallyanne Kuster, with a PMH below who presents today for heart failure medication titration.  Her medical history is significant for hypertension, persistent atrial fibrillation and LV systolic dysfunction.  Her EF is at 30-35%, by echo on Sept 14.  She is also being treated for CLL.   According to her chart she was previously on losartan 50 mg daily, which was then cut back to 25 mg shortly before her cardioversion on June 28.  At some point it was stopped, and Dr. Sallyanne Kuster restarted her at 25 mg on Sept 21.  Since then she also saw Dr. Curt Bears, who increased the carvedilol to 12.5 mg bid.    She comes into the office today feeling mostly well, but does complain about continued shortness of breath, stating she can't do as much as she used to, and that she tires out easily, even when walking on flat ground  Blood Pressure Goal:  130/80  Current Medications:  Losartan 25 mg qd am  Carvedilol 12.5 mg bid  Spironolactone 12.5 mg qd am   Furosemide 40 mg qd am  Family Hx:  Mother died at 71 from rhuematic fever  Father had hypertension, along with paternal aunts  Oldest (1/2) brother with heart transplant, other (1/2) brother with hypertension  Has 6 kids, oldest in 20's but no hypertension yet  Social Hx:  No alcohol. Tobacco, tea/soda up to 5 times per week  Diet:  Eating out more than home recently, no added salts; not excluding fast foods; does like salad; eating oatmeal for breakfast most days; ([employer] regularly)  Exercise:  Not currently - low energy, SOB  Home BP readings:  Has home cuff, not been using recently  Intolerances:   NKDA  Estimated Creatinine Clearance: 78.9 mL/min (by C-G formula based on SCr of 0.9 mg/dL).  Wt Readings from Last 3 Encounters:  07/13/17 191 lb (86.6 kg)  07/06/17 193 lb 12.8 oz (87.9 kg)  06/26/17 192 lb 9.6 oz (87.4 kg)   BP Readings from  Last 3 Encounters:  07/13/17 118/70  07/13/17 128/80  07/06/17 130/80   Pulse Readings from Last 3 Encounters:  07/13/17 64  07/13/17 (!) 56  07/06/17 (!) 137    Current Outpatient Prescriptions  Medication Sig Dispense Refill  . acyclovir (ZOVIRAX) 400 MG tablet Take 1 tablet (400 mg total) by mouth daily. 30 tablet 3  . allopurinol (ZYLOPRIM) 300 MG tablet Take 1 tablet (300 mg total) by mouth daily. 30 tablet 3  . apixaban (ELIQUIS) 5 MG TABS tablet Take 1 tablet (5 mg total) by mouth 2 (two) times daily. 60 tablet 11  . carvedilol (COREG) 12.5 MG tablet Take 1 tablet (12.5 mg total) by mouth 2 (two) times daily. 180 tablet 1  . furosemide (LASIX) 40 MG tablet Take 1 tablet (40 mg total) by mouth daily. 30 tablet 6  . losartan (COZAAR) 25 MG tablet Take 1 tablet (25 mg total) by mouth daily. 90 tablet 3  . medroxyPROGESTERone (PROVERA) 5 MG tablet TAKE 1 TABLET BY MOUTH DAILY FOR 5 DAYS EVERY OTHER MONTH IF NO SPONTANEOUS MENSES 15 tablet 1  . ondansetron (ZOFRAN) 8 MG tablet Take 1 tablet (8 mg total) by mouth every 8 (eight) hours as needed for refractory nausea / vomiting. 30 tablet 1  . predniSONE (DELTASONE) 10 MG tablet TAKE 1 TABLET BY MOUTH DAILY WITH  BREAKFAST 30 tablet 0  . prochlorperazine (COMPAZINE) 10 MG tablet Take 1 tablet (10 mg total) by mouth every 6 (six) hours as needed (Nausea or vomiting). 30 tablet 1  . spironolactone (ALDACTONE) 25 MG tablet Take 0.5 tablets (12.5 mg total) by mouth daily. 30 tablet 3  . Vitamin D, Cholecalciferol, 1000 units CAPS Take 1,000 Units by mouth every morning.      No current facility-administered medications for this visit.     No Known Allergies  Past Medical History:  Diagnosis Date  . Atrial fibrillation ([place])   . CLL (chronic lymphoblastic leukemia)    Leukemia  . Cystic breast   . Depression   . Diffuse cystic mastopathy   . Dyslipidemia (high LDL; low HDL)   . Hypertension   . Iron deficiency anemia, unspecified     . Lymphadenopathy of head and neck 04/05/2015  . Medical non-compliance   . Mitral regurgitation 02/2017   moderate to severe    Blood pressure 128/80, pulse (!) 56.  Chronic systolic heart failure (HCC) Patient with LV systolic dysfunction and EF of 30-35%.  We reviewed the benefits of Entresto over losartan and patient willing to try.  Because she is on [employer], copays should not be any problem.  Will start her with samples of the 24/26 mg dose and have her stop the losartan.  She will take her BP at home daily for the next 2 weeks, then return with that cuff and a list of her home readings.   Hopefully we will be able to titrate her up to at least the 49/51 mg dose at that time.   BMET stable in September, will repeat at her next visit in 2 weeks   Tommy Medal PharmD CPP [facility] [address] [place], [place] [address] [phone]

## 2017-07-13 NOTE — Patient Instructions (Signed)
Return for a a follow up appointment in 2 weeks  Your blood pressure today is 128/80  (goal is < 130/80)  Check your blood pressure at home daily and keep record of the readings.  Take your BP meds as follows:  Take losartan thru Wednesday morning dose  Start Entresto 24/26 mg on Thursday morning - take it twice daily  Bring all of your meds, your BP cuff and your record of home blood pressures to your next appointment.  Exercise as you're able, try to walk approximately 30 minutes per day.  Keep salt intake to a minimum, especially watch canned and prepared boxed foods.  Eat more fresh fruits and vegetables and fewer canned items.  Avoid eating in fast food restaurants.    HOW TO TAKE YOUR BLOOD PRESSURE: . Rest 5 minutes before taking your blood pressure. .  Don't smoke or drink caffeinated beverages for at least 30 minutes before. . Take your blood pressure before (not after) you eat. . Sit comfortably with your back supported and both feet on the floor (don't cross your legs). . Elevate your arm to heart level on a table or a desk. . Use the proper sized cuff. It should fit smoothly and snugly around your bare upper arm. There should be enough room to slip a fingertip under the cuff. The bottom edge of the cuff should be 1 inch above the crease of the elbow. . Ideally, take 3 measurements at one sitting and record the average.

## 2017-07-13 NOTE — Assessment & Plan Note (Signed)
Patient with LV systolic dysfunction and EF of 30-35%.  We reviewed the benefits of Entresto over losartan and patient willing to try.  Because she is on [employer], copays should not be any problem.  Will start her with samples of the 24/26 mg dose and have her stop the losartan.  She will take her BP at home daily for the next 2 weeks, then return with that cuff and a list of her home readings.   Hopefully we will be able to titrate her up to at least the 49/51 mg dose at that time.   BMET stable in September, will repeat at her next visit in 2 weeks

## 2017-07-13 NOTE — Patient Instructions (Signed)

## 2017-07-14 ENCOUNTER — Telehealth: Payer: Self-pay

## 2017-07-14 NOTE — Progress Notes (Signed)
Dr. Talbert Nan, More often than not, at her last cardiology appointments, Audrey Gross has been in atrial fibrillation. This is not a chronic (permanent) arrhythmia, but she has had recurrent episodes of persistent atrial fibrillation. We have been gradually titrating her medications for heart failure and atrial fibrillation rate control. I agree with your opinion: Audrey Gross may not be compliant with your advice. I also agree that she might be depressed. She has been resistant to several of our therapeutic recommendations, and I think in part this may be a form of denial. Sometimes she simply appears frightened about any escalation in therapy. We do have her scheduled for frequent follow-up visits, while we try to adjust her heart failure medications. We'll keep trying to encourage her and help understand the purpose of our recommendations, Thanks, [facility]

## 2017-07-14 NOTE — Telephone Encounter (Signed)
-----   Message from Milus Banister, MD sent at 07/14/2017  7:14 AM EDT ----- Audrey Gross, Thanks for the information. We'll get in touch with her.  Aliceson Dolbow, Can you contact her and offer next available ROV appt with myself or extender.  Thanks  DJ ----- Message ----- From: Salvadore Dom, MD Sent: 07/13/2017   4:55 PM To: Milus Banister, MD  Jacqulyn Cane, I saw Ms Hatler for her annual exam today (please see the attached note). She is currently undergoing chemotherapy for her CLL. She c/o hematochezia and ? Melena. A fissure was noted on exam. She was also noted to have a hgb of 9.8 gm/dl last month with her Oncologist. I don't know if that is from her Cancer or if she is having GI bleeding. I have encouraged her to follow up with you, it might help if your office reached out to her. Thanks for your help, Audrey Gross

## 2017-07-14 NOTE — Telephone Encounter (Signed)
Voice mail has not been set up

## 2017-07-15 ENCOUNTER — Ambulatory Visit (HOSPITAL_BASED_OUTPATIENT_CLINIC_OR_DEPARTMENT_OTHER): Payer: BLUE CROSS/BLUE SHIELD

## 2017-07-15 ENCOUNTER — Other Ambulatory Visit: Payer: Self-pay | Admitting: Medical

## 2017-07-15 ENCOUNTER — Emergency Department (HOSPITAL_COMMUNITY): Payer: BLUE CROSS/BLUE SHIELD

## 2017-07-15 ENCOUNTER — Ambulatory Visit (HOSPITAL_BASED_OUTPATIENT_CLINIC_OR_DEPARTMENT_OTHER): Payer: Self-pay | Admitting: Medical

## 2017-07-15 ENCOUNTER — Other Ambulatory Visit (HOSPITAL_BASED_OUTPATIENT_CLINIC_OR_DEPARTMENT_OTHER): Payer: BLUE CROSS/BLUE SHIELD

## 2017-07-15 ENCOUNTER — Encounter (HOSPITAL_COMMUNITY): Payer: Self-pay | Admitting: *Deleted

## 2017-07-15 ENCOUNTER — Encounter: Payer: Self-pay | Admitting: Hematology and Oncology

## 2017-07-15 ENCOUNTER — Ambulatory Visit (HOSPITAL_BASED_OUTPATIENT_CLINIC_OR_DEPARTMENT_OTHER): Payer: BLUE CROSS/BLUE SHIELD | Admitting: Hematology and Oncology

## 2017-07-15 ENCOUNTER — Telehealth: Payer: Self-pay | Admitting: Hematology and Oncology

## 2017-07-15 ENCOUNTER — Inpatient Hospital Stay (HOSPITAL_COMMUNITY)
Admission: EM | Admit: 2017-07-15 | Discharge: 2017-07-19 | DRG: 309 | Disposition: A | Discharge status: Home / Self Care | Payer: BLUE CROSS/BLUE SHIELD | Attending: Internal Medicine | Admitting: Internal Medicine

## 2017-07-15 VITALS — BP 132/110 | HR 130 | Temp 98.5°F | Resp 17

## 2017-07-15 DIAGNOSIS — C9110 Chronic lymphocytic leukemia of B-cell type not having achieved remission: Secondary | ICD-10-CM

## 2017-07-15 DIAGNOSIS — Z8249 Family history of ischemic heart disease and other diseases of the circulatory system: Secondary | ICD-10-CM

## 2017-07-15 DIAGNOSIS — I5042 Chronic combined systolic (congestive) and diastolic (congestive) heart failure: Secondary | ICD-10-CM | POA: Diagnosis present

## 2017-07-15 DIAGNOSIS — D630 Anemia in neoplastic disease: Secondary | ICD-10-CM

## 2017-07-15 DIAGNOSIS — Z841 Family history of disorders of kidney and ureter: Secondary | ICD-10-CM | POA: Diagnosis not present

## 2017-07-15 DIAGNOSIS — Z5112 Encounter for antineoplastic immunotherapy: Secondary | ICD-10-CM

## 2017-07-15 DIAGNOSIS — R928 Other abnormal and inconclusive findings on diagnostic imaging of breast: Secondary | ICD-10-CM

## 2017-07-15 DIAGNOSIS — E785 Hyperlipidemia, unspecified: Secondary | ICD-10-CM | POA: Diagnosis not present

## 2017-07-15 DIAGNOSIS — D509 Iron deficiency anemia, unspecified: Secondary | ICD-10-CM | POA: Diagnosis present

## 2017-07-15 DIAGNOSIS — E781 Pure hyperglyceridemia: Secondary | ICD-10-CM | POA: Diagnosis present

## 2017-07-15 DIAGNOSIS — I4819 Other persistent atrial fibrillation: Secondary | ICD-10-CM | POA: Diagnosis present

## 2017-07-15 DIAGNOSIS — F329 Major depressive disorder, single episode, unspecified: Secondary | ICD-10-CM | POA: Diagnosis not present

## 2017-07-15 DIAGNOSIS — I4891 Unspecified atrial fibrillation: Secondary | ICD-10-CM

## 2017-07-15 DIAGNOSIS — F32A Depression, unspecified: Secondary | ICD-10-CM | POA: Diagnosis present

## 2017-07-15 DIAGNOSIS — Z79899 Other long term (current) drug therapy: Secondary | ICD-10-CM

## 2017-07-15 DIAGNOSIS — Z7901 Long term (current) use of anticoagulants: Secondary | ICD-10-CM | POA: Diagnosis not present

## 2017-07-15 DIAGNOSIS — R000 Tachycardia, unspecified: Secondary | ICD-10-CM | POA: Diagnosis not present

## 2017-07-15 DIAGNOSIS — I428 Other cardiomyopathies: Secondary | ICD-10-CM | POA: Diagnosis not present

## 2017-07-15 DIAGNOSIS — Z9851 Tubal ligation status: Secondary | ICD-10-CM | POA: Diagnosis not present

## 2017-07-15 DIAGNOSIS — I429 Cardiomyopathy, unspecified: Secondary | ICD-10-CM | POA: Diagnosis not present

## 2017-07-15 DIAGNOSIS — Z9221 Personal history of antineoplastic chemotherapy: Secondary | ICD-10-CM

## 2017-07-15 DIAGNOSIS — Z800 Family history of malignant neoplasm of digestive organs: Secondary | ICD-10-CM | POA: Diagnosis not present

## 2017-07-15 DIAGNOSIS — C9190 Lymphoid leukemia, unspecified not having achieved remission: Secondary | ICD-10-CM

## 2017-07-15 DIAGNOSIS — I481 Persistent atrial fibrillation: Principal | ICD-10-CM | POA: Diagnosis present

## 2017-07-15 DIAGNOSIS — I482 Chronic atrial fibrillation, unspecified: Secondary | ICD-10-CM

## 2017-07-15 DIAGNOSIS — E669 Obesity, unspecified: Secondary | ICD-10-CM | POA: Diagnosis present

## 2017-07-15 DIAGNOSIS — I10 Essential (primary) hypertension: Secondary | ICD-10-CM | POA: Diagnosis not present

## 2017-07-15 DIAGNOSIS — I480 Paroxysmal atrial fibrillation: Secondary | ICD-10-CM | POA: Diagnosis present

## 2017-07-15 DIAGNOSIS — I110 Hypertensive heart disease with heart failure: Secondary | ICD-10-CM | POA: Diagnosis present

## 2017-07-15 DIAGNOSIS — E790 Hyperuricemia without signs of inflammatory arthritis and tophaceous disease: Secondary | ICD-10-CM

## 2017-07-15 HISTORY — DX: Other persistent atrial fibrillation: I48.19

## 2017-07-15 LAB — CBC WITH DIFFERENTIAL/PLATELET
BASO%: 1.1 % (ref 0.0–2.0)
Basophils Absolute: 0.6 10*3/uL — ABNORMAL HIGH (ref 0.0–0.1)
Basophils Absolute: 0 K/uL (ref 0.0–0.1)
Basophils Relative: 0 %
EOS%: 0.1 % (ref 0.0–7.0)
Eosinophils Absolute: 0.1 10*3/uL (ref 0.0–0.5)
Eosinophils Absolute: 0 K/uL (ref 0.0–0.7)
Eosinophils Relative: 0 %
HCT: 35.9 % (ref 34.8–46.6)
HCT: 35.2 % — ABNORMAL LOW (ref 36.0–46.0)
HGB: 11.2 g/dL — ABNORMAL LOW (ref 11.6–15.9)
Hemoglobin: 11.5 g/dL — ABNORMAL LOW (ref 12.0–15.0)
LYMPH%: 91.8 % — AB (ref 14.0–49.7)
Lymphocytes Relative: 76 %
Lymphs Abs: 21 K/uL — ABNORMAL HIGH (ref 0.7–4.0)
MCH: 27.4 pg (ref 25.1–34.0)
MCH: 28.4 pg (ref 26.0–34.0)
MCHC: 31.3 g/dL — ABNORMAL LOW (ref 31.5–36.0)
MCHC: 32.7 g/dL (ref 30.0–36.0)
MCV: 87.6 fL (ref 79.5–101.0)
MCV: 86.9 fL (ref 78.0–100.0)
MONO#: 0.9 10*3/uL (ref 0.1–0.9)
MONO%: 1.5 % (ref 0.0–14.0)
Monocytes Absolute: 0 K/uL — ABNORMAL LOW (ref 0.1–1.0)
Monocytes Relative: 0 %
NEUT#: 3.2 10*3/uL (ref 1.5–6.5)
NEUT%: 5.5 % — AB (ref 38.4–76.8)
Neutro Abs: 6.6 K/uL (ref 1.7–7.7)
Neutrophils Relative %: 24 %
PLATELETS: 364 10*3/uL (ref 145–400)
Platelets: 351 K/uL (ref 150–400)
RBC: 4.09 10*6/uL (ref 3.70–5.45)
RBC: 4.05 MIL/uL (ref 3.87–5.11)
RDW: 17 % — ABNORMAL HIGH (ref 11.2–14.5)
RDW: 16.7 % — ABNORMAL HIGH (ref 11.5–15.5)
WBC: 58.9 10*3/uL (ref 3.9–10.3)
WBC: 27.6 K/uL — ABNORMAL HIGH (ref 4.0–10.5)
lymph#: 54.1 10*3/uL — ABNORMAL HIGH (ref 0.9–3.3)

## 2017-07-15 LAB — COMPREHENSIVE METABOLIC PANEL WITH GFR
ALT: 26 U/L (ref 14–54)
AST: 38 U/L (ref 15–41)
Albumin: 3.8 g/dL (ref 3.5–5.0)
Alkaline Phosphatase: 61 U/L (ref 38–126)
Anion gap: 12 (ref 5–15)
BUN: 17 mg/dL (ref 6–20)
CO2: 19 mmol/L — ABNORMAL LOW (ref 22–32)
Calcium: 8.6 mg/dL — ABNORMAL LOW (ref 8.9–10.3)
Chloride: 109 mmol/L (ref 101–111)
Creatinine, Ser: 0.98 mg/dL (ref 0.44–1.00)
GFR calc Af Amer: >60 mL/min
GFR calc non Af Amer: >60 mL/min
Glucose, Bld: 134 mg/dL — ABNORMAL HIGH (ref 65–99)
Potassium: 4.2 mmol/L (ref 3.5–5.1)
Sodium: 140 mmol/L (ref 135–145)
Total Bilirubin: 0.6 mg/dL (ref 0.3–1.2)
Total Protein: 6.3 g/dL — ABNORMAL LOW (ref 6.5–8.1)

## 2017-07-15 LAB — COMPREHENSIVE METABOLIC PANEL
ALT: 21 U/L (ref 0–55)
AST: 19 U/L (ref 5–34)
Albumin: 3.6 g/dL (ref 3.5–5.0)
Alkaline Phosphatase: 56 U/L (ref 40–150)
Anion Gap: 12 mEq/L — ABNORMAL HIGH (ref 3–11)
BUN: 14.7 mg/dL (ref 7.0–26.0)
CALCIUM: 9.2 mg/dL (ref 8.4–10.4)
CHLORIDE: 108 meq/L (ref 98–109)
CO2: 22 meq/L (ref 22–29)
CREATININE: 1 mg/dL (ref 0.6–1.1)
EGFR: >60 mL/min/{1.73_m2} (ref >60)
GLUCOSE: 109 mg/dL (ref 70–140)
POTASSIUM: 3.8 meq/L (ref 3.5–5.1)
SODIUM: 142 meq/L (ref 136–145)
Total Bilirubin: 0.61 mg/dL (ref 0.20–1.20)
Total Protein: 6.4 g/dL (ref 6.4–8.3)

## 2017-07-15 LAB — TROPONIN I: Troponin I: <0.03 ng/mL (ref <0.03)

## 2017-07-15 LAB — URIC ACID: Uric Acid, Serum: 4.6 mg/dl (ref 2.6–7.4)

## 2017-07-15 LAB — I-STAT TROPONIN, ED: Troponin i, poc: 0 ng/mL (ref 0.00–0.08)

## 2017-07-15 LAB — TSH: TSH: 0.069 u[IU]/mL — AB (ref 0.350–4.500)

## 2017-07-15 LAB — MAGNESIUM: Magnesium: 1.9 mg/dL (ref 1.7–2.4)

## 2017-07-15 LAB — TECHNOLOGIST REVIEW

## 2017-07-15 LAB — MRSA PCR SCREENING: MRSA BY PCR: NEGATIVE

## 2017-07-15 MED ORDER — ACETAMINOPHEN 325 MG PO TABS
650.0000 mg | ORAL_TABLET | ORAL | Status: DC | PRN
  Administered 2017-07-18: 650 mg via ORAL
  Filled 2017-07-15: qty 2

## 2017-07-15 MED ORDER — ENSURE ENLIVE PO LIQD
237.0000 mL | Freq: Two times a day (BID) | ORAL | Status: DC
  Administered 2017-07-16 – 2017-07-19 (×5): 237 mL via ORAL

## 2017-07-15 MED ORDER — FAMOTIDINE IN NACL 20-0.9 MG/50ML-% IV SOLN
INTRAVENOUS | Status: AC
  Filled 2017-07-15: qty 50

## 2017-07-15 MED ORDER — FAMOTIDINE IN NACL 20-0.9 MG/50ML-% IV SOLN
20.0000 mg | Freq: Once | INTRAVENOUS | Status: AC
  Administered 2017-07-15: 20 mg via INTRAVENOUS

## 2017-07-15 MED ORDER — SODIUM CHLORIDE 0.9 % IV SOLN
90.0000 mg/m2 | Freq: Once | INTRAVENOUS | Status: DC
  Filled 2017-07-15: qty 7

## 2017-07-15 MED ORDER — ONDANSETRON HCL 4 MG/2ML IJ SOLN: 4.0000 mg | Freq: Four times a day (QID) | INTRAMUSCULAR | Status: DC | PRN

## 2017-07-15 MED ORDER — SODIUM CHLORIDE 0.9 % IV SOLN
375.0000 mg/m2 | Freq: Once | INTRAVENOUS | Status: AC
  Administered 2017-07-15: 800 mg via INTRAVENOUS
  Filled 2017-07-15: qty 30

## 2017-07-15 MED ORDER — DEXAMETHASONE SODIUM PHOSPHATE 10 MG/ML IJ SOLN
INTRAMUSCULAR | Status: AC
  Filled 2017-07-15: qty 1

## 2017-07-15 MED ORDER — MEPERIDINE HCL 25 MG/ML IJ SOLN
25.0000 mg | Freq: Once | INTRAMUSCULAR | Status: AC
  Administered 2017-07-15: 25 mg via INTRAVENOUS

## 2017-07-15 MED ORDER — ONDANSETRON HCL 4 MG PO TABS: 8.0000 mg | ORAL_TABLET | Freq: Three times a day (TID) | ORAL | Status: DC | PRN

## 2017-07-15 MED ORDER — DILTIAZEM LOAD VIA INFUSION
15.0000 mg | Freq: Once | INTRAVENOUS | Status: AC
  Administered 2017-07-15: 15 mg via INTRAVENOUS
  Filled 2017-07-15: qty 15

## 2017-07-15 MED ORDER — PALONOSETRON HCL INJECTION 0.25 MG/5ML
0.2500 mg | Freq: Once | INTRAVENOUS | Status: AC
  Administered 2017-07-15: 0.25 mg via INTRAVENOUS

## 2017-07-15 MED ORDER — METHYLPREDNISOLONE SODIUM SUCC 125 MG IJ SOLR
125.0000 mg | Freq: Once | INTRAMUSCULAR | Status: AC | PRN
  Administered 2017-07-15: 125 mg via INTRAVENOUS

## 2017-07-15 MED ORDER — FUROSEMIDE 40 MG PO TABS: 40.0000 mg | ORAL_TABLET | Freq: Every day | ORAL | Status: DC

## 2017-07-15 MED ORDER — DILTIAZEM HCL-DEXTROSE 100-5 MG/100ML-% IV SOLN (PREMIX)
5.0000 mg/h | INTRAVENOUS | Status: DC
  Administered 2017-07-15: 5 mg/h via INTRAVENOUS
  Administered 2017-07-16 (×2): 12.5 mg/h via INTRAVENOUS
  Filled 2017-07-15 (×4): qty 100

## 2017-07-15 MED ORDER — ALLOPURINOL 100 MG PO TABS
300.0000 mg | ORAL_TABLET | Freq: Every day | ORAL | Status: DC
  Administered 2017-07-16 – 2017-07-19 (×4): 300 mg via ORAL
  Filled 2017-07-15 (×4): qty 3

## 2017-07-15 MED ORDER — SPIRONOLACTONE 25 MG PO TABS
12.5000 mg | ORAL_TABLET | Freq: Every day | ORAL | Status: DC
  Administered 2017-07-16 – 2017-07-19 (×4): 12.5 mg via ORAL
  Filled 2017-07-15 (×4): qty 1

## 2017-07-15 MED ORDER — PROCHLORPERAZINE MALEATE 10 MG PO TABS: 10.0000 mg | ORAL_TABLET | Freq: Four times a day (QID) | ORAL | Status: DC | PRN

## 2017-07-15 MED ORDER — SODIUM CHLORIDE 0.9 % IV SOLN
Freq: Once | INTRAVENOUS | Status: AC
  Administered 2017-07-15: 10:00:00 via INTRAVENOUS

## 2017-07-15 MED ORDER — APIXABAN 5 MG PO TABS
5.0000 mg | ORAL_TABLET | Freq: Two times a day (BID) | ORAL | Status: DC
  Administered 2017-07-15 – 2017-07-19 (×8): 5 mg via ORAL
  Filled 2017-07-15 (×8): qty 1

## 2017-07-15 MED ORDER — MEPERIDINE HCL 25 MG/ML IJ SOLN
INTRAMUSCULAR | Status: AC
  Filled 2017-07-15: qty 1

## 2017-07-15 MED ORDER — VITAMIN D 1000 UNITS PO TABS
1000.0000 [IU] | ORAL_TABLET | Freq: Every day | ORAL | Status: DC
  Administered 2017-07-16 – 2017-07-19 (×4): 1000 [IU] via ORAL
  Filled 2017-07-15 (×4): qty 1

## 2017-07-15 MED ORDER — HEPARIN SOD (PORK) LOCK FLUSH 100 UNIT/ML IV SOLN
500.0000 [IU] | Freq: Once | INTRAVENOUS | Status: DC | PRN
  Filled 2017-07-15: qty 5

## 2017-07-15 MED ORDER — ACETAMINOPHEN 325 MG PO TABS
650.0000 mg | ORAL_TABLET | Freq: Once | ORAL | Status: AC
  Administered 2017-07-15: 650 mg via ORAL

## 2017-07-15 MED ORDER — ACYCLOVIR 400 MG PO TABS
400.0000 mg | ORAL_TABLET | Freq: Every day | ORAL | Status: DC
  Administered 2017-07-16 – 2017-07-19 (×4): 400 mg via ORAL
  Filled 2017-07-15 (×4): qty 1

## 2017-07-15 MED ORDER — CARVEDILOL 12.5 MG PO TABS
12.5000 mg | ORAL_TABLET | Freq: Two times a day (BID) | ORAL | Status: DC
  Administered 2017-07-15 – 2017-07-16 (×2): 12.5 mg via ORAL
  Filled 2017-07-15 (×2): qty 1

## 2017-07-15 MED ORDER — PALONOSETRON HCL INJECTION 0.25 MG/5ML
INTRAVENOUS | Status: AC
  Filled 2017-07-15: qty 5

## 2017-07-15 MED ORDER — DILTIAZEM HCL 25 MG/5ML IV SOLN: 15.0000 mg | Freq: Once | INTRAVENOUS | Status: DC

## 2017-07-15 MED ORDER — DIPHENHYDRAMINE HCL 25 MG PO CAPS
ORAL_CAPSULE | ORAL | Status: AC
  Filled 2017-07-15: qty 2

## 2017-07-15 MED ORDER — ACETAMINOPHEN 325 MG PO TABS
ORAL_TABLET | ORAL | Status: AC
  Filled 2017-07-15: qty 2

## 2017-07-15 MED ORDER — DIPHENHYDRAMINE HCL 50 MG/ML IJ SOLN
25.0000 mg | Freq: Once | INTRAMUSCULAR | Status: AC | PRN
  Administered 2017-07-15: 25 mg via INTRAVENOUS

## 2017-07-15 MED ORDER — FAMOTIDINE IN NACL 20-0.9 MG/50ML-% IV SOLN
20.0000 mg | Freq: Once | INTRAVENOUS | Status: AC | PRN
  Administered 2017-07-15: 20 mg via INTRAVENOUS

## 2017-07-15 MED ORDER — PREDNISONE 10 MG PO TABS
10.0000 mg | ORAL_TABLET | Freq: Every day | ORAL | Status: DC
  Administered 2017-07-16 – 2017-07-19 (×4): 10 mg via ORAL
  Filled 2017-07-15 (×4): qty 1

## 2017-07-15 MED ORDER — SODIUM CHLORIDE 0.9% FLUSH
10.0000 mL | INTRAVENOUS | Status: DC | PRN
  Filled 2017-07-15: qty 10

## 2017-07-15 MED ORDER — HYDROCORTISONE ACETATE 25 MG RE SUPP
25.0000 mg | Freq: Two times a day (BID) | RECTAL | Status: DC
  Administered 2017-07-15 – 2017-07-19 (×8): 25 mg via RECTAL
  Filled 2017-07-15 (×8): qty 1

## 2017-07-15 MED ORDER — DEXAMETHASONE SODIUM PHOSPHATE 10 MG/ML IJ SOLN
10.0000 mg | Freq: Once | INTRAMUSCULAR | Status: AC
  Administered 2017-07-15: 10 mg via INTRAVENOUS

## 2017-07-15 MED ORDER — DIPHENHYDRAMINE HCL 25 MG PO CAPS
50.0000 mg | ORAL_CAPSULE | Freq: Once | ORAL | Status: AC
  Administered 2017-07-15: 50 mg via ORAL

## 2017-07-15 NOTE — ED Notes (Signed)
ADMISSION Provider at bedside. 

## 2017-07-15 NOTE — ED Triage Notes (Signed)
Per Pt at cancer center receiving treatment CHEMO and back began to heart. Pt started to shake. RN vitals showed elevated HR 180's. EKG performed MD evaluated pt and recommended pt be sent to ED for treatment. Pt denies pain at present. No other complaints, EDP Campos present upon pt's arrival to ED

## 2017-07-15 NOTE — Telephone Encounter (Signed)
The pt has been scheduled for tomorrow with Nevin Bloodgood.

## 2017-07-15 NOTE — Telephone Encounter (Signed)
Gave avs and calendar for November  °

## 2017-07-15 NOTE — Assessment & Plan Note (Signed)
Her blood pressure is now low I suspect her previous hypertension could be contributed by untreated disease I would defer to her primary care doctor for medication adjustment

## 2017-07-15 NOTE — ED Provider Notes (Addendum)
[facility] DEPT Provider Note   CSN: [id number] Arrival date & time: 07/15/17  1336     History   Chief Complaint Chief Complaint  Patient presents with  . Tachycardia    HPI Audrey Gross is a 56 y.o. female.  HPI Patient is currently receiving chemotherapy for chronic lymphocytic leukemia.  She began having an episode of shaking in her heart racing.  She was treated as a possible allergic reaction brought to the emergency department for evaluation.  On arrival to emergency department she is in rapid atrial fibrillation.  She has a history of chronic atrial fibrillation for which she is on chronic anticoagulation.  She states compliance to medications.  No fevers or chills.  No shortness of breath.  Symptoms are mild in severity.  She continues to have palpitations at this time.   Past Medical History:  Diagnosis Date  . Atrial fibrillation ([place])   . CLL (chronic lymphoblastic leukemia)    Leukemia  . Cystic breast   . Depression   . Diffuse cystic mastopathy   . Dyslipidemia (high LDL; low HDL)   . Hypertension   . Iron deficiency anemia, unspecified   . Lymphadenopathy of head and neck 04/05/2015  . Medical non-compliance   . Mitral regurgitation 02/2017   moderate to severe    Patient Active Problem List   Diagnosis Date Noted  . Chronic systolic heart failure ([place]) 06/27/2017  . Chronic atrial fibrillation ([place]) 06/23/2017  . Goals of care, counseling/discussion 05/29/2017  . Ear fullness 04/30/2017  . Cough 04/30/2017  . Nonischemic cardiomyopathy ([place]) 04/07/2017  . Chronic anticoagulation 03/23/2017  . Mitral regurgitation 02/28/2017  . Obesity 08/12/2016  . Hypertriglyceridemia without hypercholesterolemia 07/12/2016  . Screening for cervical cancer 06/11/2016  . PAF (paroxysmal atrial fibrillation) ([place]) 01/01/2016  . 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 Abnormal mammogram of right breast 02/28/2014  . Dyslipidemia (high LDL; low HDL)  07/22/2012  . Osteopenia 06/21/2012  . Routine general medical examination at a health care facility 06/21/2012  . Hypertension 06/21/2012  . CLL (chronic lymphocytic leukemia) ([place]) 01/05/2012  . Iron deficiency anemia 01/05/2012  . Depression 01/05/2012  . 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 CARDIOVERSION N/A 04/02/2017   Procedure: CARDIOVERSION;  Surgeon: Dorothy Spark, MD;  Location: [facility] ENDOSCOPY;  Service: Cardiovascular;  Laterality: N/A;  . TUBAL LIGATION      OB History    Gravida Para Term Preterm AB Living   6 6 6     5    SAB TAB Ectopic Multiple Live Births           5       Home Medications    Prior to Admission medications   Medication Sig Start Date End Date Taking? Authorizing Provider  acyclovir (ZOVIRAX) 400 MG tablet Take 1 tablet (400 mg total) by mouth daily. 06/11/17  Yes Gorsuch, Ni, MD  allopurinol (ZYLOPRIM) 300 MG tablet Take 1 tablet (300 mg total) by mouth daily. 06/11/17  Yes Heath Lark, MD  apixaban (ELIQUIS) 5 MG TABS tablet Take 1 tablet (5 mg total) by mouth 2 (two) times daily. 08/13/16  Yes Croitoru, Mihai, MD  carvedilol (COREG) 12.5 MG tablet Take 1 tablet (12.5 mg total) by mouth 2 (two) times daily. 07/06/17  Yes Camnitz, Will Hassell Done, MD  furosemide (LASIX) 40 MG tablet Take 1 tablet (40 mg total) by mouth daily.  03/01/17  Yes Rogelia Mire, NP  losartan (COZAAR) 25 MG tablet Take 1 tablet (25 mg total) by mouth daily. 06/26/17 09/24/17 Yes Croitoru, Mihai, MD  ondansetron (ZOFRAN) 8 MG tablet Take 1 tablet (8 mg total) by mouth every 8 (eight) hours as needed for refractory nausea / vomiting. 06/11/17  Yes Gorsuch, Ni, MD  predniSONE (DELTASONE) 10 MG tablet TAKE 1 TABLET BY MOUTH DAILY WITH BREAKFAST 07/02/17  Yes Gorsuch, Ni, MD  prochlorperazine (COMPAZINE) 10 MG tablet Take 1 tablet (10 mg total) by mouth every 6 (six) hours as needed  (Nausea or vomiting). 06/11/17  Yes Gorsuch, Ni, MD  spironolactone (ALDACTONE) 25 MG tablet Take 0.5 tablets (12.5 mg total) by mouth daily. 02/28/17  Yes Rogelia Mire, NP  Vitamin D, Cholecalciferol, 1000 units CAPS Take 1,000 Units by mouth every morning.    Yes [provider]  medroxyPROGESTERone (PROVERA) 5 MG tablet TAKE 1 TABLET BY MOUTH DAILY FOR 5 DAYS EVERY OTHER MONTH IF NO SPONTANEOUS MENSES Patient not taking: Reported on 07/15/2017 09/15/16   Salvadore Dom, MD    Family History Family History  Problem Relation Age of Onset  . Sudden death Mother   . Kidney disease Father        H/O HD and kidney transplant  . Stomach cancer Maternal Aunt   . Stomach cancer Paternal Grandmother   . Heart disease Brother   . Heart disease Maternal Grandmother     Social History Social History  Substance Use Topics  . Smoking status: Never Smoker  . Smokeless tobacco: Never Used  . Alcohol use No     Allergies   Patient has no known allergies.   Review of Systems Review of Systems  All other systems reviewed and are negative.    Physical Exam Updated Vital Signs BP 111/78   Pulse (!) 113   Resp (!) 23   SpO2 100%   Physical Exam  Constitutional: She is oriented to person, place, and time. She appears well-developed and well-nourished. No distress.  HENT:  Head: Normocephalic and atraumatic.  Eyes: EOM are normal.  Neck: Normal range of motion.  Cardiovascular:  Tachycardia.  Irregularly irregular.  Pulmonary/Chest: Effort normal and breath sounds normal.  Abdominal: Soft. She exhibits no distension. There is no tenderness.  Musculoskeletal: Normal range of motion.  Neurological: She is alert and oriented to person, place, and time.  Skin: Skin is warm and dry.  Psychiatric: She has a normal mood and affect. Judgment normal.  Nursing note and vitals reviewed.    ED Treatments / Results  Labs (all labs ordered are listed, but only  abnormal results are displayed) Labs Reviewed  CBC WITH DIFFERENTIAL/PLATELET - Abnormal; Notable for the following:       Result Value   WBC 27.6 (*)    Hemoglobin 11.5 (*)    HCT 35.2 (*)    RDW 16.7 (*)    Lymphs Abs 21.0 (*)    Monocytes Absolute 0.0 (*)    All other components within normal limits  COMPREHENSIVE METABOLIC PANEL - Abnormal; Notable for the following:    CO2 19 (*)    Glucose, Bld 134 (*)    Calcium 8.6 (*)    Total Protein 6.3 (*)    All other components within normal limits  TROPONIN I  I-STAT TROPONIN, ED    EKG ECG interpretation   Date: 07/15/2017  Rate: 151   Rhythm: atrial fibrillation with RVR   QRS Axis:  normal  Intervals: normal  ST/T Wave abnormalities: normal  Conduction Disutrbances: none  Narrative Interpretation:   Old EKG Reviewed: changed, rate increased.      Radiology No results found.  Procedures .Critical Care Performed by: Jola Schmidt Authorized by: Jola Schmidt     Total critical care time: 33 minutes Critical care time was exclusive of separately billable procedures and treating other patients. Critical care was necessary to treat or prevent imminent or life-threatening deterioration. Critical care was time spent personally by me on the following activities: development of treatment plan with patient and/or surrogate as well as nursing, discussions with consultants, evaluation of patient's response to treatment, examination of patient, obtaining history from patient or surrogate, ordering and performing treatments and interventions, ordering and review of laboratory studies, ordering and review of radiographic studies, pulse oximetry and re-evaluation of patient's condition.   Medications Ordered in ED Medications  diltiazem (CARDIZEM) 1 mg/mL load via infusion 15 mg (15 mg Intravenous Bolus from Bag 07/15/17 1406)    And  diltiazem (CARDIZEM) 100 mg in dextrose 5% 131mL (1 mg/mL) infusion (10 mg/hr Intravenous  Rate/Dose Verify 07/15/17 1556)  diltiazem (CARDIZEM) 1 mg/mL load via infusion 15 mg (15 mg Intravenous Bolus from Bag 07/15/17 1625)     Initial Impression / Assessment and Plan / ED Course  I have reviewed the triage vital signs and the nursing notes.  Pertinent labs & imaging results that were available during my care of the patient were reviewed by me and considered in my medical decision making (see chart for details).     Patient will be admitted the hospital for ongoing atrial fibrillation with rapid ventricular response despite IV boluses and IV drip of Cardizem.  Chronic A. fib.  On anticoagulation.   Final Clinical Impressions(s) / ED Diagnoses   Final diagnoses:  Atrial fibrillation with rapid ventricular response [facility])    New Prescriptions New Prescriptions   No medications on file     Jola Schmidt, MD 07/15/17 Karrie Meres, MD 08/07/17 [phone]

## 2017-07-15 NOTE — H&P (Signed)
History and Physical    Audrey Gross DOB: Apr 27, 1961 DOA: 07/15/2017  PCP: Janith Lima, MD  Patient coming from: Cancer center.  I have personally briefly reviewed patient's old medical records in [place]  Chief Complaint: tremors  HPI: Audrey Gross is a 56 y.o. female with medical history significant of afib, CLL undergoing chemotherapy, HTN, Depression, chronic diastolic heart failure with EF of 30-35% per 2-D echo 9 2018 with a moderately dilated left atrium of 45 mm, with prior history of cardioversion to sinus rhythm however converted back to A. fib with RVR who was recently seen by EP on 07/06/2017. During office visit with EP was recommended that patient increase her Coreg to 12.5 mg twice daily and options discussed with patient in terms of returning her to sinus rhythm including ablation and medical management. Patient presented to the ED from the [facility] while receiving chemotherapy and noted to have an episode of generalized shaking heart racing, diaphoresis, back pain, increased heart rate. Patient denied any palpitations, no chest pain, no syncopal episode, no change in shortness of breath, no fever, no chills, no emesis. Patient does endorse some lightheadedness. Patient does also endorse some nausea and episode of diarrhea for 2 days with some noted blood in stool and on the toilet paper which has since resolved. Patient denied any abdominal pain, no dysuria, no hematemesis. Patient did state she been compliant with her medications.  ED Course: In the ED comprehensive metabolic profile obtained had a bicarbonate of 19 glucose of 134 other vessels within normal limits. Troponin was negative. CBC had a white count of 27.6, hemoglobin of 11.5 otherwise was within normal limits. Chest x-ray done was negative for any acute infiltrate. EKG showed A. fib with RVR with heart rate of 199. Patient given 2 boluses of Cardizem pushes and then placed on a Cardizem  drip high-grade now ranging from the low 100s to 130s. Hospitalists were called to admit the patient for further evaluation and management.  Review of Systems: As per HPI otherwise 10 point review of systems negative.   Past Medical History:  Diagnosis Date  . Atrial fibrillation ([place])   . CLL (chronic lymphoblastic leukemia)    Leukemia  . Cystic breast   . Depression   . Diffuse cystic mastopathy   . Dyslipidemia (high LDL; low HDL)   . Hypertension   . Iron deficiency anemia, unspecified   . Lymphadenopathy of head and neck 04/05/2015  . Medical non-compliance   . Mitral regurgitation 02/2017   moderate to severe    Past Surgical History:  Procedure Laterality Date  . CARDIOVERSION N/A 04/02/2017   Procedure: CARDIOVERSION;  Surgeon: Dorothy Spark, MD;  Location: [facility] ENDOSCOPY;  Service: Cardiovascular;  Laterality: N/A;  . TUBAL LIGATION       reports that she has never smoked. She has never used smokeless tobacco. She reports that she does not drink alcohol or use drugs.  No Known Allergies  Family History  Problem Relation Age of Onset  . Sudden death Mother   . Kidney disease Father        H/O HD and kidney transplant  . Stomach cancer Maternal Aunt   . Stomach cancer Paternal Grandmother   . Heart disease Brother   . Heart disease Maternal Grandmother    Mother deceased age 57 from probable rheumatic fever. Father deceased at 3 from kidney disease.  Prior to Admission medications   Medication Sig Start Date End  Date Taking? Authorizing Provider  acyclovir (ZOVIRAX) 400 MG tablet Take 1 tablet (400 mg total) by mouth daily. 06/11/17  Yes Gorsuch, Ni, MD  allopurinol (ZYLOPRIM) 300 MG tablet Take 1 tablet (300 mg total) by mouth daily. 06/11/17  Yes Heath Lark, MD  apixaban (ELIQUIS) 5 MG TABS tablet Take 1 tablet (5 mg total) by mouth 2 (two) times daily. 08/13/16  Yes Croitoru, Mihai, MD  carvedilol (COREG) 12.5 MG tablet Take 1 tablet (12.5 mg total) by mouth  2 (two) times daily. 07/06/17  Yes Camnitz, Will Hassell Done, MD  furosemide (LASIX) 40 MG tablet Take 1 tablet (40 mg total) by mouth daily. 03/01/17  Yes Rogelia Mire, NP  losartan (COZAAR) 25 MG tablet Take 1 tablet (25 mg total) by mouth daily. 06/26/17 09/24/17 Yes Croitoru, Mihai, MD  ondansetron (ZOFRAN) 8 MG tablet Take 1 tablet (8 mg total) by mouth every 8 (eight) hours as needed for refractory nausea / vomiting. 06/11/17  Yes Gorsuch, Ni, MD  predniSONE (DELTASONE) 10 MG tablet TAKE 1 TABLET BY MOUTH DAILY WITH BREAKFAST 07/02/17  Yes Gorsuch, Ni, MD  prochlorperazine (COMPAZINE) 10 MG tablet Take 1 tablet (10 mg total) by mouth every 6 (six) hours as needed (Nausea or vomiting). 06/11/17  Yes Gorsuch, Ni, MD  spironolactone (ALDACTONE) 25 MG tablet Take 0.5 tablets (12.5 mg total) by mouth daily. 02/28/17  Yes Rogelia Mire, NP  Vitamin D, Cholecalciferol, 1000 units CAPS Take 1,000 Units by mouth every morning.    Yes [provider]  medroxyPROGESTERone (PROVERA) 5 MG tablet TAKE 1 TABLET BY MOUTH DAILY FOR 5 DAYS EVERY OTHER MONTH IF NO SPONTANEOUS MENSES Patient not taking: Reported on 07/15/2017 09/15/16   Salvadore Dom, MD    Physical Exam: Vitals:   07/15/17 1730 07/15/17 1800 07/15/17 1811 07/15/17 1853  BP: 129/87 117/81 117/81 (!) 128/95  Pulse: (!) 103 72 (!) 110 (!) 110  Resp: (!) 24 (!) 30 20 20   Temp:    66.0 F (36.8 C)  TempSrc:    Oral  SpO2: 100% 99% 96% 99%    Constitutional: NAD, calm, comfortable Vitals:   07/15/17 1730 07/15/17 1800 07/15/17 1811 07/15/17 1853  BP: 129/87 117/81 117/81 (!) 128/95  Pulse: (!) 103 72 (!) 110 (!) 110  Resp: (!) 24 (!) 30 20 20   Temp:    98.3 F (36.8 C)  TempSrc:    Oral  SpO2: 100% 99% 96% 99%   Eyes: PERRLA,EOMI, lids and conjunctivae normal ENMT: Mucous membranes are moist. Posterior pharynx clear of any exudate or lesions.Normal dentition.  Neck: normal, supple, no masses, no  thyromegaly Respiratory: clear to auscultation bilaterally, no wheezing, no crackles. Normal respiratory effort. No accessory muscle use.  Cardiovascular: Irregularly irregular, no murmurs / rubs / gallops. No extremity edema. 2+ pedal pulses. No carotid bruits.  Abdomen: no tenderness, no masses palpated. No hepatosplenomegaly. Bowel sounds positive.  Musculoskeletal: no clubbing / cyanosis. No joint deformity upper and lower extremities. Good ROM, no contractures. Normal muscle tone.  Skin: no rashes, lesions, ulcers. No induration Neurologic: CN 2-12 grossly intact. Sensation intact, DTR normal. Strength 5/5 in all 4.  Psychiatric: Normal judgment and insight. Alert and oriented x 3. Normal mood.    Labs on Admission: I have personally reviewed following labs and imaging studies  CBC:  Recent Labs Lab 07/15/17 0754 07/15/17 1357  WBC 58.9* 27.6*  NEUTROABS 3.2 6.6  HGB 11.2* 11.5*  HCT 35.9 35.2*  MCV 87.6 86.9  PLT 364 998   Basic Metabolic Panel:  Recent Labs Lab 07/15/17 0754 07/15/17 1357  NA 142 140  K 3.8 4.2  CL  --  109  CO2 22 19*  GLUCOSE 109 134*  BUN 14.7 17  CREATININE 1.0 0.98  CALCIUM 9.2 8.6*   GFR: Estimated Creatinine Clearance: 72.8 mL/min (by C-G formula based on SCr of 0.98 mg/dL). Liver Function Tests:  Recent Labs Lab 07/15/17 0754 07/15/17 1357  AST 19 38  ALT 21 26  ALKPHOS 56 61  BILITOT 0.61 0.6  PROT 6.4 6.3*  ALBUMIN 3.6 3.8   No results for input(s): LIPASE, AMYLASE in the last 168 hours. No results for input(s): AMMONIA in the last 168 hours. Coagulation Profile: No results for input(s): INR, PROTIME in the last 168 hours. Cardiac Enzymes:  Recent Labs Lab 07/15/17 1357  TROPONINI <0.03   BNP (last 3 results) No results for input(s): PROBNP in the last 8760 hours. HbA1C: No results for input(s): HGBA1C in the last 72 hours. CBG: No results for input(s): GLUCAP in the last 168 hours. Lipid Profile: No results  for input(s): CHOL, HDL, LDLCALC, TRIG, CHOLHDL, LDLDIRECT in the last 72 hours. Thyroid Function Tests: No results for input(s): TSH, T4TOTAL, FREET4, T3FREE, THYROIDAB in the last 72 hours. Anemia Panel: No results for input(s): VITAMINB12, FOLATE, FERRITIN, TIBC, IRON, RETICCTPCT in the last 72 hours. Urine analysis:    Component Value Date/Time   COLORURINE LT. YELLOW 07/26/2013 [place] 07/26/2013 0934   LABSPEC 1.020 07/26/2013 0934   PHURINE 6.5 07/26/2013 [place] 07/26/2013 0934   HGBUR NEGATIVE 07/26/2013 0934   BILIRUBINUR NEGATIVE 07/26/2013 0934   KETONESUR NEGATIVE 07/26/2013 0934   PROTEINUR NEGATIVE 03/25/2012 1515   UROBILINOGEN 0.2 07/26/2013 0934   NITRITE NEGATIVE 07/26/2013 0934   LEUKOCYTESUR NEGATIVE 07/26/2013 0934    Radiological Exams on Admission: Dg Chest Portable 1 View  Result Date: 07/15/2017 CLINICAL DATA:  Was at [facility] receiving chemotherapy, back began to hurt, shaking, tachycardia, history atrial fibrillation, CLL, hypertension EXAM: PORTABLE CHEST 1 VIEW COMPARISON:  Portable exam 1728 hours compared to 02/26/2017 FINDINGS: Enlargement of cardiac silhouette with pulmonary vascular congestion. Mediastinal contours normal. Lungs clear. No pulmonary infiltrate, pleural effusion or pneumothorax. Bones unremarkable. IMPRESSION: Enlargement of cardiac silhouette with pulmonary vascular congestion. No acute infiltrate. Electronically Signed   By: Lavonia Dana M.D.   On: 07/15/2017 17:57    EKG: Independently reviewed. Atrial fibrillation with RVR heart rate 199 that's 40  Assessment/Plan Principal Problem:   A-fib (HCC) Active Problems:   CLL (chronic lymphocytic leukemia) (HCC)   Iron deficiency anemia   Depression   Hypertension   Dyslipidemia (high LDL; low HDL)   Anemia in neoplastic disease   PAF (paroxysmal atrial fibrillation) (HCC)   Hypertriglyceridemia without hypercholesterolemia   Obesity    Chronic anticoagulation   Nonischemic cardiomyopathy ([place])    #1 A. fib with RVR CHA2DS2VASC score 3 Patient noted to be in A. fib with RVR with heart rates of 199 while getting chemotherapy the cancer center. Patient seen in the ED chest x-ray with no acute infiltrates. Troponin I was negative 1. Comprehensive metabolic profile was essentially unremarkable. CBC with no significant anemia. Check a TSH. Check a magnesium level. Patient given 2 Cardizem pushes 15 mg as well as placed on a Cardizem drip per ED physician. Heart rates now in the low 100s to the 120s. Will admit to the step down unit  for rate control. Resume home regimen of Coreg. Patient recent 2-D echo and as such will not repeat. Eliquis for anticoagulation. Discussed case with cardiology will consult on the patient tomorrow for further evaluation and management. Patient was seen by EP recently for further management of her A. fib. Follow.  #2 CLL Patient was receiving chemotherapy when she experienced some tremors and sent to the ED. Will inform oncology of patient's admission. Outpatient follow-up.  #3 anemia in neoplastic disease H&H stable. Follow.  #4 ?? Hematochezia Patient does endorse a few bouts of hematochezia over the past couple months and also noted some on the toilet paper. Patient with no overt significant GI bleed. Likely hemorrhoidal in nature. Patient will need to reschedule outpatient appointment with GI. Will place on the sitz baths and aunts all suppository.  #5 hypertension Stable. Continue Coreg. Hold Cozaar and spironolactone as blood pressure is somewhat borderline.  DVT prophylaxis: eliquis Code Status: Full Family Communication: updated patient and husband at bedside. Disposition Plan: Home once rate controlled Consults called: Cardiology/[facility] to see in AM. Admission status: admit to SDU.   [facility] MD [facility] Pager [phone]  If 7PM-7AM, please contact  night-coverage [email or web address] Password [email or web address]  07/15/2017, 7:04 PM

## 2017-07-15 NOTE — Assessment & Plan Note (Addendum)
She tolerated treatment well except for mild nausea She is responding well with her total white count has dropped by almost half Anemia is also improving I recommend we continue treatment without dose adjustment I suspect she may need somewhere around 4-6 cycles of treatment before repeat imaging I recommend she reduce his prednisone to 5 mg daily and plan to stop in her next cycle

## 2017-07-15 NOTE — Assessment & Plan Note (Signed)
She is undergoing further evaluation to exclude breast cancer Examination show regression of all lymph nodes I suspect the abnormalities noted on mammogram is likely related to her underlying CLL

## 2017-07-15 NOTE — ED Notes (Signed)
ED Provider at bedside. 

## 2017-07-15 NOTE — Assessment & Plan Note (Signed)
She has persistent chronic atrial fibrillation and is currently rate control on anticoagulation therapy I recommend close follow-up with primary care doctor for medication management

## 2017-07-15 NOTE — Progress Notes (Signed)
1200 - Patient complained of "back aches" after initial rate completion of Rituximab infusion. Infusion paused. NS wide open. Dr. Alvy Bimler notified. Medications administered per [facility] as ordered. Dr. Alvy Bimler to chairside to assess patient. Patient placed on 1 hour observation, NS running, until symptoms resolve. Rituxan to restart at 1300 per MD.  0375 - Patient found by Delle Reining, RN to be cold and shaking. Patient assessed and Dr. Alvy Bimler and Sandi Mealy, PA notified. 25mg  Demerol administered, ordered per Dr. Alvy Bimler, for rigors. Sandi Mealy to chairside to assess patient. Additional medications administered per [facility] as ordered.  1247 - upon further assessment, pulse noted to be erratic. EKG ordered per Sandi Mealy. Per findings, Sandi Mealy instructed to admit patient to ED. Additional EKG performed and showed similar findings. Patient transported to ED via wheelchair by this RN and Sandi Mealy with husband accompanying. Report given at bedside.   Wylene Simmer, BSN, RN 07/15/2017 4:36 PM

## 2017-07-15 NOTE — Assessment & Plan Note (Signed)
This is likely anemia of chronic disease. The patient denies recent history of bleeding such as epistaxis, hematuria or hematochezia. She is asymptomatic from the anemia. We will observe for now.  

## 2017-07-15 NOTE — Progress Notes (Signed)
[facility] OFFICE PROGRESS NOTE  Patient Care Team: Janith Lima, MD as PCP - General (Internal Medicine)  SUMMARY OF ONCOLOGIC HISTORY:   CLL (chronic lymphocytic leukemia) ([place])   04/05/2015 Pathology Results    Accession: [id number] flow cytometry confirmed CLL. FISH was positive for p53 mutation      04/24/2015 Imaging    Extensive lymphadenopathy throughout the neck, chest (axilla), abdomen and pelvis, as detailed above, compatible with the reported clinical history of lymphoma. 2. Mild splenomegaly.      05/03/2015 - 08/27/2015 Chemotherapy    She started on Ibrutinib, discontinued prematurely when her prescription ran out      10/10/2015 - 05/29/2017 Chemotherapy    She was restarted back on Ibrutinib      11/13/2016 PET scan    Significant generalized reduction in size of numerous lymph nodes in the neck, chest, abdomen, and pelvis. Previously the activity of these nodes was low-level and in general a similar low-level activity is present today, significantly less than mediastinal blood pool activity, compatible with [place] 2. 2. Coronary atherosclerosis. Mild cardiomegaly. 3. Mildly prominent endometrium for age without accentuated metabolic activity in the endometrium. Consider pelvic sonography for further characterization.      05/27/2017 PET scan    1. Progressive hypermetabolic adenopathy, primarily involving cervical, axillary, pelvic and inguinal lymph nodes bilaterally, consistent with progressive lymphoma. 2. No solid visceral organ or osseous involvement.      06/16/2017 -  Chemotherapy    The patient had palonosetron (ALOXI) injection 0.25 mg, 0.25 mg, Intravenous,  Once, 1 of 4 cycles  riTUXimab (RITUXAN) 200 mg in sodium chloride 0.9 % 250 mL (0.7407 mg/mL) chemo infusion, 100 mg/m2 = 200 mg (100 % of original dose 100 mg/m2), Intravenous,  Once, 1 of 4 cycles Dose modification: 100 mg/m2 (original dose 100 mg/m2, Cycle 1, Reason: Provider  Judgment, Comment: anticipate high risk infusion)  bendamustine (BENDEKA) 175 mg in sodium chloride 0.9 % 50 mL (3.0702 mg/mL) chemo infusion, 90 mg/m2 = 175 mg, Intravenous,  Once, 1 of 4 cycles  for chemotherapy treatment.  She received Rituximab and Bendamustine       INTERVAL HISTORY: Please see below for problem oriented charting. She returns for further follow-up and for cycle 2 of treatment She declined port placement She has some mild nausea with cycle 1, resolved She also had mild diarrhea, resolved She felt weak sometimes Her blood pressure is now normal She denies new lymphadenopathy No recent infection The patient denies any recent signs or symptoms of bleeding such as spontaneous epistaxis, hematuria or hematochezia.   REVIEW OF SYSTEMS:   Constitutional: Denies fevers, chills or abnormal weight loss Eyes: Denies blurriness of vision Ears, nose, mouth, throat, and face: Denies mucositis or sore throat Respiratory: Denies cough, dyspnea or wheezes Cardiovascular: Denies palpitation, chest discomfort or lower extremity swelling Skin: Denies abnormal skin rashes Lymphatics: Denies new lymphadenopathy or easy bruising Neurological:Denies numbness, tingling or new weaknesses Behavioral/Psych: Mood is stable, no new changes  All other systems were reviewed with the patient and are negative.  I have reviewed the past medical history, past surgical history, social history and family history with the patient and they are unchanged from previous note.  ALLERGIES:  has No Known Allergies.  MEDICATIONS:  Current Outpatient Prescriptions  Medication Sig Dispense Refill  . acyclovir (ZOVIRAX) 400 MG tablet Take 1 tablet (400 mg total) by mouth daily. 30 tablet 3  . allopurinol (ZYLOPRIM) 300 MG tablet Take 1  tablet (300 mg total) by mouth daily. 30 tablet 3  . apixaban (ELIQUIS) 5 MG TABS tablet Take 1 tablet (5 mg total) by mouth 2 (two) times daily. 60 tablet 11  .  carvedilol (COREG) 12.5 MG tablet Take 1 tablet (12.5 mg total) by mouth 2 (two) times daily. 180 tablet 1  . furosemide (LASIX) 40 MG tablet Take 1 tablet (40 mg total) by mouth daily. 30 tablet 6  . losartan (COZAAR) 25 MG tablet Take 1 tablet (25 mg total) by mouth daily. 90 tablet 3  . medroxyPROGESTERone (PROVERA) 5 MG tablet TAKE 1 TABLET BY MOUTH DAILY FOR 5 DAYS EVERY OTHER MONTH IF NO SPONTANEOUS MENSES 15 tablet 1  . ondansetron (ZOFRAN) 8 MG tablet Take 1 tablet (8 mg total) by mouth every 8 (eight) hours as needed for refractory nausea / vomiting. 30 tablet 1  . predniSONE (DELTASONE) 10 MG tablet TAKE 1 TABLET BY MOUTH DAILY WITH BREAKFAST 30 tablet 0  . prochlorperazine (COMPAZINE) 10 MG tablet Take 1 tablet (10 mg total) by mouth every 6 (six) hours as needed (Nausea or vomiting). 30 tablet 1  . spironolactone (ALDACTONE) 25 MG tablet Take 0.5 tablets (12.5 mg total) by mouth daily. 30 tablet 3  . Vitamin D, Cholecalciferol, 1000 units CAPS Take 1,000 Units by mouth every morning.      No current facility-administered medications for this visit.     PHYSICAL EXAMINATION: ECOG PERFORMANCE STATUS: 1 - Symptomatic but completely ambulatory  Vitals:   07/15/17 0813  BP: 117/63  Pulse: 85  Resp: 18  Temp: 98.5 F (36.9 C)  SpO2: 100%   Filed Weights   07/15/17 0813  Weight: 192 lb 11.2 oz (87.4 kg)    GENERAL:alert, no distress and comfortable SKIN: skin color, texture, turgor are normal, no rashes or significant lesions EYES: normal, Conjunctiva are pink and non-injected, sclera clear OROPHARYNX:no exudate, no erythema and lips, buccal mucosa, and tongue normal  NECK: supple, thyroid normal size, non-tender, without nodularity LYMPH:  no palpable lymphadenopathy in the cervical, axillary or inguinal LUNGS: clear to auscultation and percussion with normal breathing effort HEART: Irregular rate and rhythm, no murmurs and no lower extremity edema ABDOMEN:abdomen soft,  non-tender and normal bowel sounds Musculoskeletal:no cyanosis of digits and no clubbing  NEURO: alert & oriented x 3 with fluent speech, no focal motor/sensory deficits  LABORATORY DATA:  I have reviewed the data as listed    Component Value Date/Time   NA 142 07/15/2017 0754   K 3.8 07/15/2017 0754   CL 103 03/23/2017 1015   CL 105 01/18/2013 0912   CO2 22 07/15/2017 0754   GLUCOSE 109 07/15/2017 0754   GLUCOSE 83 01/18/2013 0912   BUN 14.7 07/15/2017 0754   CREATININE 1.0 07/15/2017 0754   CALCIUM 9.2 07/15/2017 0754   PROT 6.4 07/15/2017 0754   ALBUMIN 3.6 07/15/2017 0754   AST 19 07/15/2017 0754   ALT 21 07/15/2017 0754   ALKPHOS 56 07/15/2017 0754   BILITOT 0.61 07/15/2017 0754   GFRNONAA 68 03/23/2017 1015   GFRAA 78 03/23/2017 1015    No results found for: SPEP, UPEP  Lab Results  Component Value Date   WBC 58.9 (HH) 07/15/2017   NEUTROABS 3.2 07/15/2017   HGB 11.2 (L) 07/15/2017   HCT 35.9 07/15/2017   MCV 87.6 07/15/2017   PLT 364 07/15/2017      Chemistry      Component Value Date/Time   NA 142 07/15/2017 0754  K 3.8 07/15/2017 0754   CL 103 03/23/2017 1015   CL 105 01/18/2013 0912   CO2 22 07/15/2017 0754   BUN 14.7 07/15/2017 0754   CREATININE 1.0 07/15/2017 0754      Component Value Date/Time   CALCIUM 9.2 07/15/2017 0754   ALKPHOS 56 07/15/2017 0754   AST 19 07/15/2017 0754   ALT 21 07/15/2017 0754   BILITOT 0.61 07/15/2017 0754       RADIOGRAPHIC STUDIES: I have personally reviewed the radiological images as listed and agreed with the findings in the report. Dg Esophagus  Result Date: 06/23/2017 CLINICAL DATA:  Dysphagia.  Lymphoma. EXAM: ESOPHOGRAM / BARIUM SWALLOW / BARIUM TABLET STUDY TECHNIQUE: Combined double contrast and single contrast examination performed using effervescent crystals, thick barium liquid, and thin barium liquid. The patient was observed with fluoroscopy swallowing a 13 mm barium sulphate tablet. FLUOROSCOPY  TIME:  Fluoroscopy Time:  2.3 minutes Radiation Exposure Index (if provided by the fluoroscopic device): 167.5 mGy Number of Acquired Spot Images: 0 COMPARISON:  PET-CT 05/27/2017. FINDINGS: Frontal and lateral views of the hypopharynx while swallowing are unremarkable. Double contrast imaging of the esophagus limited by patient difficulty and rapidly consuming effervescent crystals and barium. No gross esophageal diverticulum or focal mucosal ulceration is evident. There is some mass-effect on the distal esophagus likely related to cardiomegaly has no central mediastinal lymphadenopathy or mass lesion is evident on the PET-CT from 4 weeks ago. Esophageal motility is normal. 13 mm barium tablet passes readily into the stomach when taken with water. No evidence for hiatal hernia. IMPRESSION: Unremarkable double contrast barium esophagram with some limitation due to patient inability to rapidly consume effervescent crystals and barium. Electronically Signed   By: Misty Stanley M.D.   On: 06/23/2017 10:36   Mm Screening Breast Tomo Bilateral  Result Date: 07/02/2017 CLINICAL DATA:  Screening. EXAM: 2D DIGITAL SCREENING BILATERAL MAMMOGRAM WITH CAD AND ADJUNCT TOMO COMPARISON:  Previous exam(s). ACR Breast Density Category c: The breast tissue is heterogeneously dense, which may obscure small masses. FINDINGS: In the right breast   requires further evaluation. In the left breast   requires further evaluation. Images were processed with CAD. IMPRESSION: Further evaluation is suggested for possible enlarged axillary lymph nodes in the right breast. Further evaluation is suggested for possible enlarged axillary lymph nodes in the left breast. RECOMMENDATION: Ultrasound of both breasts. (Code:FI-B-2M) The patient will be contacted regarding the findings, and additional imaging will be scheduled. BI-RADS CATEGORY  0: Incomplete. Need additional imaging evaluation and/or prior mammograms for comparison. Electronically  Signed   By: Abelardo Diesel M.D.   On: 07/02/2017 12:22    ASSESSMENT & PLAN:  CLL (chronic lymphocytic leukemia) ([place]) She tolerated treatment well except for mild nausea She is responding well with her total white count has dropped by almost half Anemia is also improving I recommend we continue treatment without dose adjustment I suspect she may need somewhere around 4-6 cycles of treatment before repeat imaging I recommend she reduce his prednisone to 5 mg daily and plan to stop in her next cycle  Anemia in neoplastic disease This is likely anemia of chronic disease. The patient denies recent history of bleeding such as epistaxis, hematuria or hematochezia. She is asymptomatic from the anemia. We will observe for now.   Abnormal mammogram of right breast She is undergoing further evaluation to exclude breast cancer Examination show regression of all lymph nodes I suspect the abnormalities noted on mammogram is likely related to  her underlying CLL  Chronic atrial fibrillation ([place]) She has persistent chronic atrial fibrillation and is currently rate control on anticoagulation therapy I recommend close follow-up with primary care doctor for medication management  Hypertension Her blood pressure is now low I suspect her previous hypertension could be contributed by untreated disease I would defer to her primary care doctor for medication adjustment   No orders of the defined types were placed in this encounter.  All questions were answered. The patient knows to call the clinic with any problems, questions or concerns. No barriers to learning was detected. I spent 20 minutes counseling the patient face to face. The total time spent in the appointment was 30 minutes and more than 50% was on counseling and review of test results     Heath Lark, MD 07/15/2017 9:02 AM

## 2017-07-15 NOTE — ED Notes (Signed)
Bed: RESA Expected date:  Expected time:  Means of arrival:  Comments: Cancer center/tachycardia

## 2017-07-16 ENCOUNTER — Ambulatory Visit: Payer: Self-pay | Admitting: Nurse Practitioner

## 2017-07-16 ENCOUNTER — Ambulatory Visit: Payer: Self-pay

## 2017-07-16 ENCOUNTER — Encounter (HOSPITAL_COMMUNITY): Payer: Self-pay | Admitting: Physician Assistant

## 2017-07-16 ENCOUNTER — Telehealth: Payer: Self-pay | Admitting: Nurse Practitioner

## 2017-07-16 DIAGNOSIS — I428 Other cardiomyopathies: Secondary | ICD-10-CM

## 2017-07-16 DIAGNOSIS — Z7901 Long term (current) use of anticoagulants: Secondary | ICD-10-CM

## 2017-07-16 LAB — BASIC METABOLIC PANEL
ANION GAP: 11 (ref 5–15)
BUN: 19 mg/dL (ref 6–20)
CALCIUM: 8.5 mg/dL — AB (ref 8.9–10.3)
CHLORIDE: 107 mmol/L (ref 101–111)
CO2: 20 mmol/L — AB (ref 22–32)
CREATININE: 0.97 mg/dL (ref 0.44–1.00)
GFR calc non Af Amer: >60 mL/min (ref >60)
Glucose, Bld: 208 mg/dL — ABNORMAL HIGH (ref 65–99)
Potassium: 4.1 mmol/L (ref 3.5–5.1)
SODIUM: 138 mmol/L (ref 135–145)

## 2017-07-16 LAB — PROTIME-INR
INR: 1.4
PROTHROMBIN TIME: 17 s — AB (ref 11.4–15.2)

## 2017-07-16 LAB — CBC
HCT: 32.5 % — ABNORMAL LOW (ref 36.0–46.0)
HEMOGLOBIN: 10.5 g/dL — AB (ref 12.0–15.0)
MCH: 28.1 pg (ref 26.0–34.0)
MCHC: 32.3 g/dL (ref 30.0–36.0)
MCV: 86.9 fL (ref 78.0–100.0)
PLATELETS: 442 10*3/uL — AB (ref 150–400)
RBC: 3.74 MIL/uL — AB (ref 3.87–5.11)
RDW: 16.8 % — ABNORMAL HIGH (ref 11.5–15.5)
WBC: 40 10*3/uL — AB (ref 4.0–10.5)

## 2017-07-16 LAB — T4, FREE: Free T4: 1.12 ng/dL (ref 0.61–1.12)

## 2017-07-16 LAB — LIPID PANEL
CHOLESTEROL: 170 mg/dL (ref 0–200)
HDL: 31 mg/dL — AB (ref >40)
LDL CALC: 119 mg/dL — AB (ref 0–99)
TRIGLYCERIDES: 100 mg/dL (ref <150)
Total CHOL/HDL Ratio: 5.5 RATIO
VLDL: 20 mg/dL (ref 0–40)

## 2017-07-16 LAB — HIV ANTIBODY (ROUTINE TESTING W REFLEX): HIV Screen 4th Generation wRfx: NONREACTIVE

## 2017-07-16 LAB — TROPONIN I: Troponin I: <0.03 ng/mL (ref <0.03)

## 2017-07-16 MED ORDER — CARVEDILOL 12.5 MG PO TABS
25.0000 mg | ORAL_TABLET | Freq: Two times a day (BID) | ORAL | Status: DC
  Administered 2017-07-16 – 2017-07-17 (×2): 25 mg via ORAL
  Filled 2017-07-16 (×2): qty 2

## 2017-07-16 MED ORDER — FUROSEMIDE 40 MG PO TABS: 40.0000 mg | ORAL_TABLET | Freq: Every day | ORAL | Status: DC

## 2017-07-16 MED ORDER — SACUBITRIL-VALSARTAN 24-26 MG PO TABS
1.0000 | ORAL_TABLET | Freq: Two times a day (BID) | ORAL | Status: DC
  Administered 2017-07-16 – 2017-07-19 (×6): 1 via ORAL
  Filled 2017-07-16 (×6): qty 1

## 2017-07-16 MED ORDER — FUROSEMIDE 40 MG PO TABS
40.0000 mg | ORAL_TABLET | Freq: Two times a day (BID) | ORAL | Status: DC
  Administered 2017-07-16 – 2017-07-19 (×7): 40 mg via ORAL
  Filled 2017-07-16 (×7): qty 1

## 2017-07-16 NOTE — Progress Notes (Addendum)
[facility] PROGRESS NOTE  Audrey Gross OIZ:[id number] DOB: [birth year] DOA: 07/15/2017   PCP: Janith Lima, MD     Assessment/Plan: Principal Problem:   A-fib ([place]) Active Problems:   CLL (chronic lymphocytic leukemia) ([place])   Iron deficiency anemia   Depression   Hypertension   Dyslipidemia (high LDL; low HDL)   Anemia in neoplastic disease   PAF (paroxysmal atrial fibrillation) ([facility])   Hypertriglyceridemia without hypercholesterolemia   Obesity   Chronic anticoagulation   Nonischemic cardiomyopathy ([place])   56 y.o. female with medical history significant of afib, CLL undergoing chemotherapy, HTN, Depression, chronic diastolic heart failure with EF of 30-35% per 2-D echo 9 2018 with a moderately dilated left atrium of 45 mm, with prior history of cardioversion to sinus rhythm however converted back to A. fib with RVR who was recently seen by EP on 07/06/2017.  ablation Was discussed. Patient presented to the ED from the [facility] while receiving chemotherapy and noted to have an episode of generalized shaking heart racing, diaphoresis, back pain, increased heart rate.   Chest x-ray done was negative for any acute infiltrate. EKG showed A. fib with RVR with heart rate of 199. Patient given 2 boluses of Cardizem pushes and then placed on a Cardizem drip high-grade now ranging from the low 100s to 130s. Hospitalists were called to admit the patient for further evaluation and management.  Assessment and plan #1 A. fib with RVR CHA2DS2VASC score 3 Patient noted to be in A. fib with RVR with heart rates of 199 while getting chemotherapy the cancer center. Patient seen in the ED chest x-ray with no acute infiltrates. Troponin I was negative 3. Comprehensive metabolic profile was essentially unremarkable. CBC with no significant anemia.   TSH  Markedly low  At 0.069.   Will check free T4. Magnesium 1.9. Potassium 4.1.   Patient admitted to step down on Cardizem drip.  Continue Coreg.   . Patient recent 2-D echo and as such will not repeat. Eliquis for anticoagulation.  Cardiology to consult.    #2 CLL - slight increase in white count noted  27>40 Patient was receiving chemotherapy when she experienced some tremors and sent to the ED. Will inform oncology of patient's admission.   Patient is followed by Heath Lark, MD  #3 anemia in neoplastic disease  hemoglobin stable around 10.5-11.5  #4 ?? Hematochezia Patient does endorse a few bouts of hematochezia over the past couple months and also noted some on the toilet paper. Patient with no overt significant GI bleed. Likely hemorrhoidal in nature. Patient will need to reschedule outpatient appointment with GI.  Continue on the sitz baths and   Anusoll suppository.  #5 hypertension Stable. Continue Coreg. Hold Cozaar and spironolactone as blood pressure is somewhat borderline.    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 Mitral regurgitation was described as moderate and the left atrium was moderately dilated ESD 45 mm). Right ventricular systolic function was also mildly to moderately decreased Resume Lasix at 40 mg by mouth twice a day   DVT prophylaxsis eliquis   Code Status:  Full code    Family Communication: Discussed in detail with the patient, all imaging results, lab results explained to the patient   Disposition Plan:   2-3 days, continue stepdown  Consultants:  Oncology  Procedures:   none  Antibiotics: Anti-infectives    Start     Dose/Rate Route Frequency Ordered Stop   07/16/17 1000  acyclovir (ZOVIRAX) tablet 400 mg     400 mg Oral Daily 07/15/17 1929           HPI/Subjective:  Patient subjectively feels okay, denies any chest pain, shortness of breath, heart rate is  controlled  Objective: Vitals:   07/16/17 0400 07/16/17 0500 07/16/17 0600 07/16/17 0800  BP: 117/76 105/75 121/83 112/86  Pulse: 88 81 85 88  Resp: (!) 27 (!) 23 (!) 23 20  Temp:    98.8 F (37.1 C)  TempSrc:    Oral  SpO2: 95% 95% 97% 97%  Weight:      Height:        Intake/Output Summary (Last 24 hours) at 07/16/17 0915 Last data filed at 07/16/17 0600  Gross per 24 hour  Intake          1382.97 ml  Output              900 ml  Net           482.97 ml    Exam:  Examination:  General exam: Appears calm and comfortable  Respiratory system: Clear to auscultation. Respiratory effort normal. Cardiovascular system: S1 & S2 heard, RRR. No JVD, murmurs, rubs, gallops or clicks. No pedal edema. Gastrointestinal system: Abdomen is nondistended, soft and nontender. No organomegaly or masses felt. Normal bowel sounds heard. Central nervous system: Alert and oriented. No focal neurological deficits. Extremities: Symmetric 5 x 5 power. Skin: No rashes, lesions or ulcers Psychiatry: Judgement and insight appear normal. Mood & affect appropriate.     Data Reviewed: I have personally reviewed following labs and imaging studies  Micro Results Recent Results (from the past 240 hour(s))  TECHNOLOGIST REVIEW     Status: None   Collection Time: 07/15/17  7:54 AM  Result Value Ref Range Status   Technologist Review   Final    Variant lymphs, some with nucleoli and/or vacuoles, and many smudge cells present  MRSA PCR Screening     Status: None   Collection Time: 07/15/17  7:22 PM  Result Value Ref Range Status   MRSA by PCR NEGATIVE NEGATIVE Final    Comment:        The GeneXpert MRSA Assay (FDA approved for NASAL specimens only), is one component of a comprehensive MRSA colonization surveillance program. It is not intended to diagnose MRSA infection nor to guide or monitor treatment for MRSA infections.     Radiology Reports Dg Esophagus  Result Date:  06/23/2017 CLINICAL DATA:  Dysphagia.  Lymphoma. EXAM: ESOPHOGRAM / BARIUM SWALLOW / BARIUM TABLET STUDY TECHNIQUE: Combined double contrast and single contrast examination performed using effervescent crystals, thick barium liquid, and thin barium liquid. The patient was observed with fluoroscopy swallowing a 13 mm barium sulphate tablet. FLUOROSCOPY TIME:  Fluoroscopy Time:  2.3 minutes Radiation Exposure Index (if provided by the fluoroscopic device): 167.5 mGy Number of Acquired Spot Images: 0 COMPARISON:  PET-CT 05/27/2017. FINDINGS: Frontal and lateral views of the hypopharynx while swallowing are unremarkable. Double contrast imaging of the esophagus limited by patient difficulty and rapidly consuming effervescent crystals and barium. No gross esophageal diverticulum or focal mucosal ulceration is evident. There is some mass-effect on the distal esophagus likely related to cardiomegaly has no central mediastinal lymphadenopathy or mass lesion is evident on the PET-CT from 4 weeks ago.  Esophageal motility is normal. 13 mm barium tablet passes readily into the stomach when taken with water. No evidence for hiatal hernia. IMPRESSION: Unremarkable double contrast barium esophagram with some limitation due to patient inability to rapidly consume effervescent crystals and barium. Electronically Signed   By: Misty Stanley M.D.   On: 06/23/2017 10:36   Dg Chest Portable 1 View  Result Date: 07/15/2017 CLINICAL DATA:  Was at [facility] receiving chemotherapy, back began to hurt, shaking, tachycardia, history atrial fibrillation, CLL, hypertension EXAM: PORTABLE CHEST 1 VIEW COMPARISON:  Portable exam 1728 hours compared to 02/26/2017 FINDINGS: Enlargement of cardiac silhouette with pulmonary vascular congestion. Mediastinal contours normal. Lungs clear. No pulmonary infiltrate, pleural effusion or pneumothorax. Bones unremarkable. IMPRESSION: Enlargement of cardiac silhouette with pulmonary vascular  congestion. No acute infiltrate. Electronically Signed   By: Lavonia Dana M.D.   On: 07/15/2017 17:57   Mm Screening Breast Tomo Bilateral  Result Date: 07/02/2017 CLINICAL DATA:  Screening. EXAM: 2D DIGITAL SCREENING BILATERAL MAMMOGRAM WITH CAD AND ADJUNCT TOMO COMPARISON:  Previous exam(s). ACR Breast Density Category c: The breast tissue is heterogeneously dense, which may obscure small masses. FINDINGS: In the right breast   requires further evaluation. In the left breast   requires further evaluation. Images were processed with CAD. IMPRESSION: Further evaluation is suggested for possible enlarged axillary lymph nodes in the right breast. Further evaluation is suggested for possible enlarged axillary lymph nodes in the left breast. RECOMMENDATION: Ultrasound of both breasts. (Code:[id number]) The patient will be contacted regarding the findings, and additional imaging will be scheduled. BI-RADS CATEGORY  0: Incomplete. Need additional imaging evaluation and/or prior mammograms for comparison. Electronically Signed   By: Abelardo Diesel M.D.   On: 07/02/2017 12:22     CBC  Recent Labs Lab 07/15/17 0754 07/15/17 1357 07/16/17 0103  WBC 58.9* 27.6* 40.0*  HGB 11.2* 11.5* 10.5*  HCT 35.9 35.2* 32.5*  PLT 364 351 442*  MCV 87.6 86.9 86.9  MCH 27.4 28.4 28.1  MCHC 31.3* 32.7 32.3  RDW 17.0* 16.7* 16.8*  LYMPHSABS 54.1* 21.0*  --   MONOABS 0.9 0.0*  --   EOSABS 0.1 0.0  --   BASOSABS 0.6* 0.0  --     Chemistries   Recent Labs Lab 07/15/17 0754 07/15/17 1357 07/15/17 1954 07/16/17 0103  NA 142 140  --  138  K 3.8 4.2  --  4.1  CL  --  109  --  107  CO2 22 19*  --  20*  GLUCOSE 109 134*  --  208*  BUN 14.7 17  --  19  CREATININE 1.0 0.98  --  0.97  CALCIUM 9.2 8.6*  --  8.5*  MG  --   --  1.9  --   AST 19 38  --   --   ALT 21 26  --   --   ALKPHOS 56 61  --   --   BILITOT 0.61 0.6  --   --     ------------------------------------------------------------------------------------------------------------------ estimated creatinine clearance is 72.9 mL/min (by C-G formula based on SCr of 0.97 mg/dL). ------------------------------------------------------------------------------------------------------------------ No results for input(s): HGBA1C in the last 72 hours. ------------------------------------------------------------------------------------------------------------------  Recent Labs  07/16/17 0103  CHOL 170  HDL 31*  LDLCALC 119*  TRIG 100  CHOLHDL 5.5   ------------------------------------------------------------------------------------------------------------------  Recent Labs  07/15/17 1954  TSH 0.069*   ------------------------------------------------------------------------------------------------------------------ No results for input(s): VITAMINB12, FOLATE, FERRITIN, TIBC, IRON, RETICCTPCT in the last 72 hours.  Coagulation  profile  Recent Labs Lab 07/16/17 0103  INR 1.40    No results for input(s): DDIMER in the last 72 hours.  Cardiac Enzymes  Recent Labs Lab 07/15/17 1954 07/16/17 0103 07/16/17 0754  TROPONINI <0.03 <0.03 <0.03   ------------------------------------------------------------------------------------------------------------------ Invalid input(s): POCBNP   CBG: No results for input(s): GLUCAP in the last 168 hours.     Studies: Dg Chest Portable 1 View  Result Date: 07/15/2017 CLINICAL DATA:  Was at [facility] receiving chemotherapy, back began to hurt, shaking, tachycardia, history atrial fibrillation, CLL, hypertension EXAM: PORTABLE CHEST 1 VIEW COMPARISON:  Portable exam 1728 hours compared to 02/26/2017 FINDINGS: Enlargement of cardiac silhouette with pulmonary vascular congestion. Mediastinal contours normal. Lungs clear. No pulmonary infiltrate, pleural effusion or pneumothorax. Bones unremarkable.  IMPRESSION: Enlargement of cardiac silhouette with pulmonary vascular congestion. No acute infiltrate. Electronically Signed   By: Lavonia Dana M.D.   On: 07/15/2017 17:57      No results found for: HGBA1C Lab Results  Component Value Date   LDLCALC 119 (H) 07/16/2017   CREATININE 0.97 07/16/2017       Scheduled Meds: . acyclovir  400 mg Oral Daily  . allopurinol  300 mg Oral Daily  . apixaban  5 mg Oral BID  . carvedilol  12.5 mg Oral BID  . cholecalciferol  1,000 Units Oral Daily  . feeding supplement (ENSURE ENLIVE)  237 mL Oral BID BM  . furosemide  40 mg Oral Daily  . hydrocortisone  25 mg Rectal BID  . predniSONE  10 mg Oral Q breakfast  . spironolactone  12.5 mg Oral Daily   Continuous Infusions: . diltiazem (CARDIZEM) infusion 12.5 mg/hr (07/16/17 0243)     LOS: 1 day    Time spent: >30 MINS    Reyne Dumas  [facility] Pager [phone]. If 7PM-7AM, please contact night-coverage at [email or web address], password [facility] 07/16/2017, 9:15 AM  LOS: 1 day

## 2017-07-16 NOTE — Progress Notes (Signed)
PT Cancellation Note  Patient Details Name: Audrey Gross MRN: [id number] DOB: October 05, 1961   Cancelled Treatment:    Reason Eval/Treat Not Completed: PT screened, no needs identified, will sign off   Weston Anna, MPT Pager: [phone]

## 2017-07-16 NOTE — Consult Note (Addendum)
Cardiology Consultation:   Patient ID: JYLA HOPF; [id number]; [birth year]   Admit date: 07/15/2017 Date of Consult: 07/16/2017  Primary Care Provider: Janith Lima, MD Primary Cardiologist: Dr Sallyanne Kuster, 06/26/2017 Primary Electrophysiologist:  Dr Curt Bears, 07/06/2017   Patient Profile:   Audrey Gross is a 56 y.o. female with a hx of prolonged persistent atrial fib s/p DCCV 03/2017, ?pulmonary vein tachycardia, on Eliquis w/ CHADS2VASC=3 (female, CHF, HTN), S-CHF w/ EF 35% 06/2017 echo, HTN, CLL, MR, who is being seen today for the evaluation of rapid atrial fib at the request of Dr Grandville Silos.  History of Present Illness:   Audrey Gross was back in atrial fib when seen by Dr Curt Bears 10/01, Coreg increased to 12.5 mg bid. ?OSA, sleep study scheduled. Anti-arrhythmics and ablation discussed, no decision. 10/08 office visit for CHF>>Entresto added.   10/10 pt in [place] for Rituximab infusion. C/o back aches, infusion paused, IV NS given, pt developed shaking chills. ECG showed rapid atrial fib w/ HR 180s>>ER>>IV Cardizem>>HR improved. Pt admitted and cards asked to see.   Audrey Gross was in atrial when Dr Curt Bears saw her. She was concerned about side effects from the antiarrhythmics and only 70% success rate of the ablation. She also feels overwhelmed by the need to continue chemo  She has been noticing increasing DOE and her activity level has been trending down because of the DOE and fatigue.   She denies LE edema, orthopnea or PND.  No chest pain.  No palpitations, she did not realize she was in atrial fib yesterday until she was told.   Past Medical History:  Diagnosis Date  . Atrial fibrillation ([place])   . CLL (chronic lymphoblastic leukemia)    Leukemia  . Cystic breast   . Depression   . Diffuse cystic mastopathy   . Dyslipidemia (high LDL; low HDL)   . Hypertension   . Iron deficiency anemia, unspecified   . Lymphadenopathy of head and neck 04/05/2015  .  Medical non-compliance   . Mitral regurgitation 02/2017   moderate to severe  . Persistent atrial fibrillation with rapid ventricular response ([place]) 07/15/2017    Past Surgical History:  Procedure Laterality Date  . CARDIOVERSION N/A 04/02/2017   Procedure: CARDIOVERSION;  Surgeon: Dorothy Spark, MD;  Location: [facility] ENDOSCOPY;  Service: Cardiovascular;  Laterality: N/A;  . TUBAL LIGATION       Inpatient Medications: Scheduled Meds: . acyclovir  400 mg Oral Daily  . allopurinol  300 mg Oral Daily  . apixaban  5 mg Oral BID  . carvedilol  12.5 mg Oral BID  . cholecalciferol  1,000 Units Oral Daily  . feeding supplement (ENSURE ENLIVE)  237 mL Oral BID BM  . furosemide  40 mg Oral BID  . hydrocortisone  25 mg Rectal BID  . predniSONE  10 mg Oral Q breakfast  . spironolactone  12.5 mg Oral Daily   Continuous Infusions: . diltiazem (CARDIZEM) infusion 12.5 mg/hr (07/16/17 0243)   PRN Meds: acetaminophen, ondansetron (ZOFRAN) IV, ondansetron, prochlorperazine Prior to Admission medications   Medication Sig Start Date End Date Taking? Authorizing Provider  acyclovir (ZOVIRAX) 400 MG tablet Take 1 tablet (400 mg total) by mouth daily. 06/11/17  Yes Gorsuch, Ni, MD  allopurinol (ZYLOPRIM) 300 MG tablet Take 1 tablet (300 mg total) by mouth daily. 06/11/17  Yes Heath Lark, MD  apixaban (ELIQUIS) 5 MG TABS tablet Take 1 tablet (5 mg total) by mouth 2 (two) times daily. 08/13/16  Yes  Croitoru, Mihai, MD  carvedilol (COREG) 12.5 MG tablet Take 1 tablet (12.5 mg total) by mouth 2 (two) times daily. 07/06/17  Yes Camnitz, Will Hassell Done, MD  furosemide (LASIX) 40 MG tablet Take 1 tablet (40 mg total) by mouth daily. 03/01/17  Yes Rogelia Mire, NP  losartan (COZAAR) 25 MG tablet Take 1 tablet (25 mg total) by mouth daily. 06/26/17 09/24/17 Yes Croitoru, Mihai, MD  ondansetron (ZOFRAN) 8 MG tablet Take 1 tablet (8 mg total) by mouth every 8 (eight) hours as needed for refractory nausea /  vomiting. 06/11/17  Yes Gorsuch, Ni, MD  predniSONE (DELTASONE) 10 MG tablet TAKE 1 TABLET BY MOUTH DAILY WITH BREAKFAST 07/02/17  Yes Gorsuch, Ni, MD  prochlorperazine (COMPAZINE) 10 MG tablet Take 1 tablet (10 mg total) by mouth every 6 (six) hours as needed (Nausea or vomiting). 06/11/17  Yes Gorsuch, Ni, MD  spironolactone (ALDACTONE) 25 MG tablet Take 0.5 tablets (12.5 mg total) by mouth daily. 02/28/17  Yes Rogelia Mire, NP  Vitamin D, Cholecalciferol, 1000 units CAPS Take 1,000 Units by mouth every morning.    Yes [provider]  medroxyPROGESTERone (PROVERA) 5 MG tablet TAKE 1 TABLET BY MOUTH DAILY FOR 5 DAYS EVERY OTHER MONTH IF NO SPONTANEOUS MENSES Patient not taking: Reported on 07/15/2017 09/15/16   Salvadore Dom, MD    Allergies:   No Known Allergies  Social History:   Social History   Social History  . Marital status: Legally Separated    Spouse name: N/A  . Number of children: 6  . 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 Smoking status: Never Smoker  . Smokeless tobacco: Never Used  . Alcohol use No  . Drug use: No  . Sexual activity: Not Currently    Partners: Male    Birth control/ protection: Surgical   Other Topics Concern  . Not on file   Social History Narrative  . No narrative on file    Family History:   The patient's family history includes Heart disease in her brother and maternal grandmother; Kidney disease in her father; Stomach cancer in her maternal aunt and paternal grandmother; Sudden death in her mother. Pt indicated that her mother is deceased. She indicated that her father is deceased. She indicated that the status of her brother is unknown. She indicated that her maternal grandmother is deceased. She indicated that her maternal grandfather is deceased. She indicated that her paternal grandmother is deceased. She indicated that her paternal grandfather is deceased. She  indicated that the status of her maternal aunt is unknown.    ROS:  Please see the history of present illness.  All other ROS reviewed and negative.      Physical Exam/Data:   Vitals:   07/16/17 0400 07/16/17 0500 07/16/17 0600 07/16/17 0800  BP: 117/76 105/75 121/83 112/86  Pulse: 88 81 85 88  Resp: (!) 27 (!) 23 (!) 23 20  Temp:    98.8 F (37.1 C)  TempSrc:    Oral  SpO2: 95% 95% 97% 97%  Weight:      Height:        Intake/Output Summary (Last 24 hours) at 07/16/17 1130 Last data filed at 07/16/17 0600  Gross per 24 hour  Intake          1382.97 ml  Output              900  ml  Net           482.97 ml   Filed Weights   07/15/17 1921  Weight: 189 lb 2.5 oz (85.8 kg)    Body mass index is 29.63 kg/m.  General:  Well nourished, well developed, in no acute distress HEENT: normal Lymph: no adenopathy Neck: no JVD Endocrine:  No thryomegaly Vascular: No carotid bruits; FA pulses 2+ bilaterally without bruits  Cardiac:  normal S1, S2; rapid and irreg R&R; no murmur  Lungs:  clear to auscultation bilaterally, no wheezing, rhonchi or rales  Abd: soft, nontender, no hepatomegaly  Ext: no edema Musculoskeletal:  No deformities, BUE and BLE strength normal and equal Skin: warm and dry  Neuro:  CNs 2-12 intact, no focal abnormalities noted Psych:  Normal affect   EKG:  The EKG was personally reviewed and demonstrates:  10/10 at 13:02 pm> atrial fib, RVR, HR 201; 10/11 ECG> Atrial fib, HR 84 Telemetry:  Telemetry was personally reviewed and demonstrates:  Atrial fib, HR gradually improved overnight and was mostly normal since 11 pm. HR increased this am, but pt has been up more.   Relevant CV Studies:  ECHO: 06/19/2017 - Left ventricle: LVEF is depressed at approxiamately 35% with   severe hypokinesis of tine inferior and septal walls The cavity   size was normal. Wall thickness was increased in a pattern of   mild LVH. Systolic function was moderately to severely  reduced.   The estimated ejection fraction was in the range of 30% to 35%. - Mitral valve: There was moderate regurgitation. - Left atrium: The atrium was moderately dilated. - Right atrium:  The atrium was normal in size. - Right ventricle: Systolic function was mildly to moderately reduced. Impressions: - No significant change in LVEF from echo of May 2018.  Laboratory Data:  Chemistry  Recent Labs Lab 07/15/17 0754 07/15/17 1357 07/16/17 0103  NA 142 140 138  K 3.8 4.2 4.1  CL  --  109 107  CO2 22 19* 20*  GLUCOSE 109 134* 208*  BUN 14.7 17 19   CREATININE 1.0 0.98 0.97  CALCIUM 9.2 8.6* 8.5*  GFRNONAA  --  >60 >60  GFRAA  --  >60 >60  ANIONGAP 12* 12 11     Recent Labs Lab 07/15/17 0754 07/15/17 1357  PROT 6.4 6.3*  ALBUMIN 3.6 3.8  AST 19 38  ALT 21 26  ALKPHOS 56 61  BILITOT 0.61 0.6   Hematology  Recent Labs Lab 07/15/17 0754 07/15/17 1357 07/16/17 0103  WBC 58.9* 27.6* 40.0*  RBC 4.09 4.05 3.74*  HGB 11.2* 11.5* 10.5*  HCT 35.9 35.2* 32.5*  MCV 87.6 86.9 86.9  MCH 27.4 28.4 28.1  MCHC 31.3* 32.7 32.3  RDW 17.0* 16.7* 16.8*  PLT 364 351 442*   Cardiac Enzymes  Recent Labs Lab 07/15/17 1357 07/15/17 1954 07/16/17 0103 07/16/17 0754  TROPONINI <0.03 <0.03 <0.03 <0.03     Recent Labs Lab 07/15/17 1402  TROPIPOC 0.00     Radiology/Studies:  Dg Chest Portable 1 View  Result Date: 07/15/2017 CLINICAL DATA:  Was at [facility] receiving chemotherapy, back began to hurt, shaking, tachycardia, history atrial fibrillation, CLL, hypertension EXAM: PORTABLE CHEST 1 VIEW COMPARISON:  Portable exam 1728 hours compared to 02/26/2017 FINDINGS: Enlargement of cardiac silhouette with pulmonary vascular congestion. Mediastinal contours normal. Lungs clear. No pulmonary infiltrate, pleural effusion or pneumothorax. Bones unremarkable. IMPRESSION: Enlargement of cardiac silhouette with pulmonary vascular congestion. No acute infiltrate.  Electronically  Signed   By: Lavonia Dana M.D.   On: 07/15/2017 17:57    Assessment and Plan:   Principal Problem: 1.  Persistent atrial fibrillation with rapid ventricular response (HCC) - pt HR improved w/ rate control rx, home dose of Coreg 12.5 mg bid plus IV Cardizem - currently on Cardizem IV at 12.5 mg/hr - long discussion w/ patient regarding pros and cons of pursuing antiarrhythmic or ablation now vs waiting for CLL chemo to be completed and sleep study completed (probably needs CPAP)  - For now, rate control.  - discuss w/ MD converting IV Cardizem 12.5 mg/hr (300 mg/day) to 90 mg q 8 hr or go straight to 300 mg/day - could do DCCV if no doses Eliquis missed. - However, per Dr Victorino December note, he is concerned she has afib 2nd pulm vein tachycardia, not sure if that makes her less likely to maintain SR. - encouraged her to get a free phone app so that she can check her HR when she feels bad.  - if she wants to go ahead and pursue antiarrhythmic rx or ablation, f/u w/ Dr Curt Bears - otherwise, f/u w/ Dr Sallyanne Kuster.  2. Chronic anticoagulation - if DCCV planned, need to confirm she has not missed a dose of Eliquis - CHADS2VASC=2 (female, HTN)  Otherwise, per IM Active Problems:   CLL (chronic lymphocytic leukemia) (HCC)   Iron deficiency anemia   Depression   Hypertension   Dyslipidemia (high LDL; low HDL)   Anemia in neoplastic disease   PAF (paroxysmal atrial fibrillation) (HCC)   Hypertriglyceridemia without hypercholesterolemia   Obesity   Chronic anticoagulation   Nonischemic cardiomyopathy ([place])  Signed, Barrett, Suanne Marker, PA-C  07/16/2017 11:30 AM   The patient was seen, examined and discussed with Rosaria Ferries, PA-C and I agree with the above.   56 y.o. female with a hx of prolonged persistent atrial fib s/p DCCV 03/2017, ?pulmonary vein tachycardia, on Eliquis w/ CHADS2VASC=3 (female, CHF, HTN), S-CHF w/ EF 35% 06/2017 echo, HTN, CLL, MR, who is being seen today  for the evaluation of rapid atrial fib at the request of Dr Grandville Silos. She is being followed by Dr Sallyanne Kuster and Dr Curt Bears.  She was back in atrial fib when seen by Dr Curt Bears 10/01, Coreg increased to 12.5 mg bid. ?OSA, sleep study scheduled. Anti-arrhythmics and ablation discussed, no decision. 10/08 office visit for CHF>>Entresto added, not started yet.  10/10 pt in [place] for Rituximab infusion. C/o back aches, infusion paused, IV NS given, pt developed shaking chills. ECG showed rapid atrial fib w/ HR 180s>>ER>>IV Cardizem>>HR improved. Pt admitted and cards asked to see.  Audrey Gross was in atrial when Dr Curt Bears saw her. She was concerned about side effects from the antiarrhythmics and only 70% success rate of the ablation. She also feels overwhelmed by the need to continue chemo She has been noticing increasing DOE and her activity level has been trending down because of the DOE and fatigue.  She currently is chest pain free, no SOB, no signs of CHF. She remains in a-fib with ventricular rates 80-90. She is still on Cardizem drip at 12.5 mg per hour. I will discontinue Cardizem drip, increase carvedilol to 25 mg by mouth twice a day and add Entresto 24/26 mg by mouth twice a day starting tonight. Discontinue losartan.   Ena Dawley, MD 07/16/2017

## 2017-07-16 NOTE — Progress Notes (Signed)
Sympt[facility] Management Clinic Progress Note   Audrey Gross [id number] [birth year] 56 y.o.  Audrey Gross is managed by Dr. Dr. Heath Lark  Actively treated with chemotherapy: yes  Current Therapy: Rituxan and bendamustine  Last Treated: 10 / 10 / 2018  Assessment: Plan:    Atrial fibrillation with RVR ([place]) - Plan: EKG 12-Lead  Infusion reaction, initial encounter   Atrial fibrillation with RVR: An EKG returned showing atrial fibrillation with a rapid ventricular response at 196-201 bpm. Voltage criteria metastases for left ventricular hypertrophy with ST and T-wave abnormalities. Consideration was given for inferior lateral ischemia. The patient was taken to the emergency room for management.  Chronic lymphocytic leukemia with infusion reaction to rituximab. The patient was given Demerol 25 mg IV, Pepcid 20 mg IV, and Benadryl 25 mg IV. She was given an IV fluid bolus and O2 via nasal cannula. The patient showed a drop in her oxygen saturation to around 78 with the patient recovering to 98% on 3 L via nasal cannula. The patient had rigors which resolved after around 15 minutes.  Please see After Visit Summary for patient specific instructions.  Future Appointments Date Time Provider [place]  07/22/2017 9:00 AM GI-BCG [place] 1 [facility]-BCGUS GI-BREAST CE  07/22/2017 9:05 AM GI-BCG [place] 1 GI-BCGUS GI-BREAST CE  07/24/2017 1:30 PM Willia Craze, NP [facility]-GI [facility]  07/29/2017 9:00 AM CVD-NLINE PHARMACIST [facility]  08/12/2017 8:30 AM [facility] LAB 6 [facility] None  08/12/2017 9:00 AM Alvy Bimler, Ni, MD [facility] None  08/12/2017 10:00 AM [facility] D13 [facility] None  08/13/2017 11:00 AM [facility] D12 [facility] None  08/30/2017 8:00 PM MSD-SLEEL ROOM 1 MSD-SLEEL MSD  09/22/2017 9:15 AM Janith Lima, MD [facility]-ELAM [facility]  07/14/2018 2:30 PM Salvadore Dom, MD [place] None    Orders Placed This Encounter  Procedures  . EKG 12-Lead        Subjective:   Patient ID:  Audrey Gross is a 56 y.o. (DOB 07-Aug-1961) female.  Chief Complaint: No chief complaint on file.   HPI Audrey Gross is a 56 year old female with a history of a chronic leukocytic leukemia with bulky lymphadenopathy presents for cycle 2, day 1 of Rituxan and bendamustine. Patient was nearing her first bump up of Rituxan when she developed rigors and oxygen desaturation. Dr. Alvy Bimler was contacted and the patient received Demerol 25 mg IV. Those symptoms management PA was contacted. The patient was evaluated and was given Pepcid 20 mg IV and Benadryl 20 mg IV. Her oxygen saturation was noted to drop to 78% but rebounded to 98 after placed on O2 via nasal cannula at 3 L/m. The patient did not have chest tightness or swelling of her throat or mouth. Her rigors continued for around 15 minutes. It was noted that her pulse rate was erratic and range from 98-196. An EKG was completed which showed atrial fibrillation with a rapid ventricular response ranging from 196-201 bpm. The patient has a history of A. fib. According to her chart, her pulse was around 136 most recently at her primary care provider. The patient was noted to have a pulse rate that was more normal after her rigors subsided with her pulse rate ranging from around 50 to the mid 90s. A repeat EKG was completed and continued to show A. fib with RVR at around 190 bpm.  Medications: I have reviewed the patient's current medications.  Allergies: No Known Allergies  Past Medical History:  Diagnosis Date  . Atrial fibrillation (  [place])   . CLL (chronic lymphoblastic leukemia)    Leukemia  . Cystic breast   . Depression   . Diffuse cystic mastopathy   . Dyslipidemia (high LDL; low HDL)   . Hypertension   . Iron deficiency anemia, unspecified   . Lymphadenopathy of head and neck 04/05/2015  . Medical non-compliance   . Mitral regurgitation 02/2017   moderate to severe  . Persistent atrial fibrillation with rapid  ventricular response ([place]) 07/15/2017    Past Surgical History:  Procedure Laterality Date  . CARDIOVERSION N/A 04/02/2017   Procedure: CARDIOVERSION;  Surgeon: Dorothy Spark, MD;  Location: [facility] ENDOSCOPY;  Service: Cardiovascular;  Laterality: N/A;  . TUBAL LIGATION      Family History  Problem Relation Age of Onset  . Sudden death Mother   . Kidney disease Father        H/O HD and kidney transplant  . Stomach cancer Maternal Aunt   . Stomach cancer Paternal Grandmother   . Heart disease Brother   . Heart disease Maternal Grandmother     Social History   Social History  . Marital status: Legally Separated    Spouse name: N/A  . Number of children: 6  . 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 Smoking status: Never Smoker  . Smokeless tobacco: Never Used  . Alcohol use No  . Drug use: No  . Sexual activity: Not Currently    Partners: Male    Birth control/ protection: Surgical   Other Topics Concern  . Not on file   Social History Narrative  . No narrative on file    Past Medical History, Surgical history, Social history, and Family history were reviewed and updated as appropriate.   Please see review of systems for further details on the patient's review from today.   Review of Systems:  Review of Systems  Constitutional: Negative for chills, diaphoresis and fever.  HENT: Negative for trouble swallowing.   Respiratory: Negative for choking, chest tightness and shortness of breath.   Cardiovascular: Positive for palpitations. Negative for chest pain.  Neurological:       Rigors    Objective:   Physical Exam:  There were no vitals taken for this visit. ECOG: 0  Physical Exam  Constitutional:  The patient is an adult female with rigors.  Cardiovascular: S1 normal and S2 normal.  Tachycardia present.  Exam reveals no distant heart sounds.   Pulmonary/Chest: Effort normal and breath sounds  normal. No respiratory distress. She has no wheezes. She has no rales.  Musculoskeletal: She exhibits no edema.  Neurological: She is alert.  Skin: Skin is warm and dry. She is not diaphoretic.    Lab Review:     Component Value Date/Time   NA 138 07/16/2017 0103   NA 142 07/15/2017 0754   K 4.1 07/16/2017 0103   K 3.8 07/15/2017 0754   CL 107 07/16/2017 0103   CL 105 01/18/2013 0912   CO2 20 (L) 07/16/2017 0103   CO2 22 07/15/2017 0754   GLUCOSE 208 (H) 07/16/2017 0103   GLUCOSE 109 07/15/2017 0754   GLUCOSE 83 01/18/2013 0912   BUN 19 07/16/2017 0103   BUN 14.7 07/15/2017 0754   CREATININE 0.97 07/16/2017 0103   CREATININE 1.0 07/15/2017 0754   CALCIUM 8.5 (L) 07/16/2017 0103   CALCIUM 9.2 07/15/2017 0754   PROT 6.3 (L) 07/15/2017 1357   PROT  6.4 07/15/2017 0754   ALBUMIN 3.8 07/15/2017 1357   ALBUMIN 3.6 07/15/2017 0754   AST 38 07/15/2017 1357   AST 19 07/15/2017 0754   ALT 26 07/15/2017 1357   ALT 21 07/15/2017 0754   ALKPHOS 61 07/15/2017 1357   ALKPHOS 56 07/15/2017 0754   BILITOT 0.6 07/15/2017 1357   BILITOT 0.61 07/15/2017 0754   GFRNONAA >60 07/16/2017 0103   GFRAA >60 07/16/2017 0103       Component Value Date/Time   WBC 40.0 (H) 07/16/2017 0103   RBC 3.74 (L) 07/16/2017 0103   HGB 10.5 (L) 07/16/2017 0103   HGB 11.2 (L) 07/15/2017 0754   HCT 32.5 (L) 07/16/2017 0103   HCT 35.9 07/15/2017 0754   PLT 442 (H) 07/16/2017 0103   PLT 364 07/15/2017 0754   PLT 266 03/23/2017 1015   MCV 86.9 07/16/2017 0103   MCV 87.6 07/15/2017 0754   MCH 28.1 07/16/2017 0103   MCHC 32.3 07/16/2017 0103   RDW 16.8 (H) 07/16/2017 0103   RDW 17.0 (H) 07/15/2017 0754   LYMPHSABS 21.0 (H) 07/15/2017 1357   LYMPHSABS 54.1 (H) 07/15/2017 0754   MONOABS 0.0 (L) 07/15/2017 1357   MONOABS 0.9 07/15/2017 0754   EOSABS 0.0 07/15/2017 1357   EOSABS 0.1 07/15/2017 0754   BASOSABS 0.0 07/15/2017 1357   BASOSABS 0.6 (H) 07/15/2017 0754    -------------------------------  Imaging from last 24 hours (if applicable):  Radiology interpretation: Dg Esophagus  Result Date: 06/23/2017 CLINICAL DATA:  Dysphagia.  Lymphoma. EXAM: ESOPHOGRAM / BARIUM SWALLOW / BARIUM TABLET STUDY TECHNIQUE: Combined double contrast and single contrast examination performed using effervescent crystals, thick barium liquid, and thin barium liquid. The patient was observed with fluoroscopy swallowing a 13 mm barium sulphate tablet. FLUOROSCOPY TIME:  Fluoroscopy Time:  2.3 minutes Radiation Exposure Index (if provided by the fluoroscopic device): 167.5 mGy Number of Acquired Spot Images: 0 COMPARISON:  PET-CT 05/27/2017. FINDINGS: Frontal and lateral views of the hypopharynx while swallowing are unremarkable. Double contrast imaging of the esophagus limited by patient difficulty and rapidly consuming effervescent crystals and barium. No gross esophageal diverticulum or focal mucosal ulceration is evident. There is some mass-effect on the distal esophagus likely related to cardiomegaly has no central mediastinal lymphadenopathy or mass lesion is evident on the PET-CT from 4 weeks ago. Esophageal motility is normal. 13 mm barium tablet passes readily into the stomach when taken with water. No evidence for hiatal hernia. IMPRESSION: Unremarkable double contrast barium esophagram with some limitation due to patient inability to rapidly consume effervescent crystals and barium. Electronically Signed   By: Misty Stanley M.D.   On: 06/23/2017 10:36   Dg Chest Portable 1 View  Result Date: 07/15/2017 CLINICAL DATA:  Was at [facility] receiving chemotherapy, back began to hurt, shaking, tachycardia, history atrial fibrillation, CLL, hypertension EXAM: PORTABLE CHEST 1 VIEW COMPARISON:  Portable exam 1728 hours compared to 02/26/2017 FINDINGS: Enlargement of cardiac silhouette with pulmonary vascular congestion. Mediastinal contours normal. Lungs clear. No pulmonary  infiltrate, pleural effusion or pneumothorax. Bones unremarkable. IMPRESSION: Enlargement of cardiac silhouette with pulmonary vascular congestion. No acute infiltrate. Electronically Signed   By: Lavonia Dana M.D.   On: 07/15/2017 17:57   Mm Screening Breast Tomo Bilateral  Result Date: 07/02/2017 CLINICAL DATA:  Screening. EXAM: 2D DIGITAL SCREENING BILATERAL MAMMOGRAM WITH CAD AND ADJUNCT TOMO COMPARISON:  Previous exam(s). ACR Breast Density Category c: The breast tissue is heterogeneously dense, which may obscure small masses. FINDINGS: In the right  breast   requires further evaluation. In the left breast   requires further evaluation. Images were processed with CAD. IMPRESSION: Further evaluation is suggested for possible enlarged axillary lymph nodes in the right breast. Further evaluation is suggested for possible enlarged axillary lymph nodes in the left breast. RECOMMENDATION: Ultrasound of both breasts. (Code:[id number]) The patient will be contacted regarding the findings, and additional imaging will be scheduled. BI-RADS CATEGORY  0: Incomplete. Need additional imaging evaluation and/or prior mammograms for comparison. Electronically Signed   By: Abelardo Diesel M.D.   On: 07/02/2017 12:22        This case was discussed with Dr. Alvy Bimler. She expresses agreement with my management of this patient.

## 2017-07-16 NOTE — Care Management Note (Signed)
Case Management Note  Patient Details  Name: Audrey Gross MRN: [id number] Date of Birth: 1960-11-01  Subjective/Objective:                  A.fib  Action/Plan: Date:  July 16, 2017 Chart reviewed for concurrent status and case management needs.  Will continue to follow patient progress.  Discharge Planning: following for needs  Expected discharge date: [id number]  Leyda Vanderwerf, BSN, [place], [place]   Expected Discharge Date:  07/18/17               Expected Discharge Plan:  Home/Self Care  In-House Referral:     Discharge planning Services  CM Consult  Post Acute Care Choice:    Choice offered to:     DME Arranged:    DME Agency:     HH Arranged:    HH Agency:     Status of Service:  In process, will continue to follow  If discussed at Long Length of Stay Meetings, dates discussed:    Additional Comments:  Devynn, [place], RN 07/16/2017, 8:52 AM

## 2017-07-16 NOTE — Progress Notes (Signed)
OT Cancellation Note  Patient Details Name: Audrey Gross MRN: [id number] DOB: [birth year]   Cancelled Treatment:    Reason Eval/Treat Not Completed: OT screened, no needs identified, will sign off. Pt independent moving around and performing ADLs  Jermon Chalfant 07/16/2017, 12:39 PM  Lesle Chris, OTR/L [phone] 07/16/2017

## 2017-07-17 DIAGNOSIS — D509 Iron deficiency anemia, unspecified: Secondary | ICD-10-CM

## 2017-07-17 DIAGNOSIS — E785 Hyperlipidemia, unspecified: Secondary | ICD-10-CM

## 2017-07-17 DIAGNOSIS — I481 Persistent atrial fibrillation: Principal | ICD-10-CM

## 2017-07-17 LAB — CBC
HCT: 31.7 % — ABNORMAL LOW (ref 36.0–46.0)
Hemoglobin: 10 g/dL — ABNORMAL LOW (ref 12.0–15.0)
MCH: 27.3 pg (ref 26.0–34.0)
MCHC: 31.5 g/dL (ref 30.0–36.0)
MCV: 86.6 fL (ref 78.0–100.0)
PLATELETS: 355 10*3/uL (ref 150–400)
RBC: 3.66 MIL/uL — ABNORMAL LOW (ref 3.87–5.11)
RDW: 16.7 % — AB (ref 11.5–15.5)
WBC: 35.2 10*3/uL — ABNORMAL HIGH (ref 4.0–10.5)

## 2017-07-17 LAB — COMPREHENSIVE METABOLIC PANEL
ALBUMIN: 3.6 g/dL (ref 3.5–5.0)
ALK PHOS: 48 U/L (ref 38–126)
ALT: 25 U/L (ref 14–54)
AST: 20 U/L (ref 15–41)
Anion gap: 10 (ref 5–15)
BILIRUBIN TOTAL: 0.3 mg/dL (ref 0.3–1.2)
BUN: 23 mg/dL — AB (ref 6–20)
CALCIUM: 8.7 mg/dL — AB (ref 8.9–10.3)
CO2: 22 mmol/L (ref 22–32)
Chloride: 109 mmol/L (ref 101–111)
Creatinine, Ser: 0.87 mg/dL (ref 0.44–1.00)
GFR calc Af Amer: >60 mL/min (ref >60)
GFR calc non Af Amer: >60 mL/min (ref >60)
GLUCOSE: 125 mg/dL — AB (ref 65–99)
Potassium: 3.9 mmol/L (ref 3.5–5.1)
SODIUM: 141 mmol/L (ref 135–145)
TOTAL PROTEIN: 6 g/dL — AB (ref 6.5–8.1)

## 2017-07-17 LAB — GLUCOSE, CAPILLARY
GLUCOSE-CAPILLARY: 148 mg/dL — AB (ref 65–99)
Glucose-Capillary: 107 mg/dL — ABNORMAL HIGH (ref 65–99)
Glucose-Capillary: 186 mg/dL — ABNORMAL HIGH (ref 65–99)

## 2017-07-17 MED ORDER — METOPROLOL TARTRATE 25 MG PO TABS
25.0000 mg | ORAL_TABLET | Freq: Two times a day (BID) | ORAL | Status: DC
  Administered 2017-07-17 – 2017-07-19 (×5): 25 mg via ORAL
  Filled 2017-07-17 (×6): qty 1

## 2017-07-17 MED ORDER — DILTIAZEM HCL ER COATED BEADS 120 MG PO CP24: 240.0000 mg | ORAL_CAPSULE | Freq: Every day | ORAL | Status: DC

## 2017-07-17 MED ORDER — METOPROLOL TARTRATE 5 MG/5ML IV SOLN: 2.5000 mg | INTRAVENOUS | Status: DC | PRN

## 2017-07-17 MED ORDER — METOPROLOL TARTRATE 5 MG/5ML IV SOLN: 2.5000 mg | Freq: Once | INTRAVENOUS | Status: DC

## 2017-07-17 MED ORDER — DILTIAZEM HCL ER COATED BEADS 180 MG PO CP24
180.0000 mg | ORAL_CAPSULE | Freq: Every day | ORAL | Status: DC
  Administered 2017-07-17: 180 mg via ORAL
  Filled 2017-07-17: qty 1

## 2017-07-17 MED ORDER — METOPROLOL TARTRATE 5 MG/5ML IV SOLN: 2.5000 mg | Freq: Once | INTRAVENOUS | Status: DC | PRN

## 2017-07-17 NOTE — Progress Notes (Signed)
Audrey Banister, MD  Salvadore Dom, MD; Jeoffrey Massed, RN        Sharee Pimple,  Thanks for the information. We'll get in touch with her.   Patty,  Can you contact her and offer next available ROV appt with myself or extender. Thanks   DJ

## 2017-07-17 NOTE — Progress Notes (Signed)
Progress Note  Patient Name: Audrey Gross Date of Encounter: 07/17/2017  Primary Cardiologist: Dr Sallyanne Kuster, 06/26/2017 Primary Electrophysiologist:  Dr Curt Bears, 07/06/2017  Patient Profile     56 y.o. female w/ hx prolonged persistent atrial fib s/p DCCV 03/2017, ?pulmonary vein tachycardia, on Eliquis w/ CHADS2VASC=3 (female, CHF, HTN), S-CHF w/ EF 35% 06/2017 echo, HTN, CLL, MR, was admitted 10/10 for rapid atrial fib, cards consulted.  Subjective   Gets SOB when she gets up and around, no awareness of rapid HR. Very anxious about things, brother has had heart transplant, wants a second opinion from his cardiologist. Admits to anxiety, denies rx for it.  Inpatient Medications    Scheduled Meds: . acyclovir  400 mg Oral Daily  . allopurinol  300 mg Oral Daily  . apixaban  5 mg Oral BID  . carvedilol  25 mg Oral BID  . cholecalciferol  1,000 Units Oral Daily  . feeding supplement (ENSURE ENLIVE)  237 mL Oral BID BM  . furosemide  40 mg Oral BID  . hydrocortisone  25 mg Rectal BID  . predniSONE  10 mg Oral Q breakfast  . sacubitril-valsartan  1 tablet Oral BID  . spironolactone  12.5 mg Oral Daily   Continuous Infusions:  PRN Meds: acetaminophen, metoprolol tartrate, ondansetron (ZOFRAN) IV, ondansetron, prochlorperazine   Vital Signs    Vitals:   07/17/17 0600 07/17/17 0700 07/17/17 0800 07/17/17 0914  BP: 137/85 (!) 102/58  (!) 128/98  Pulse: (!) 109 (!) 102  (!) 131  Resp: (!) 34 20    Temp:   97.9 F (36.6 C)   TempSrc:   Oral   SpO2: 98% 97%    Weight:      Height:        Intake/Output Summary (Last 24 hours) at 07/17/17 0938 Last data filed at 07/17/17 0500  Gross per 24 hour  Intake            727.5 ml  Output             2600 ml  Net          -1872.5 ml   Filed Weights   07/15/17 1921 07/17/17 0400  Weight: 189 lb 2.5 oz (85.8 kg) 188 lb 15 oz (85.7 kg)    Telemetry    Atrial fib, generally RVR - Personally Reviewed  ECG    10/10 at  13:02 pm> atrial fib, RVR, HR 201; 10/11 ECG> Atrial fib, HR 84; personally reviewed.  Physical Exam   General: Well developed, well nourished, female appearing in no acute distress. Head: Normocephalic, atraumatic.  Neck: Supple without bruits, JVD not elevated. Lungs:  Resp regular and unlabored, CTA. Heart: rapid and irreg R&R, S1, S2, no S3, S4, or murmur; no rub. Abdomen: Soft, non-tender, non-distended with normoactive bowel sounds. No hepatomegaly. No rebound/guarding. No obvious abdominal masses. Extremities: No clubbing, cyanosis, no edema. Distal pedal pulses are 2+ bilaterally. Neuro: Alert and oriented X 3. Moves all extremities spontaneously. Psych: Normal affect.  Labs    Hematology  Recent Labs Lab 07/15/17 1357 07/16/17 0103 07/17/17 0327  WBC 27.6* 40.0* 35.2*  RBC 4.05 3.74* 3.66*  HGB 11.5* 10.5* 10.0*  HCT 35.2* 32.5* 31.7*  MCV 86.9 86.9 86.6  MCH 28.4 28.1 27.3  MCHC 32.7 32.3 31.5  RDW 16.7* 16.8* 16.7*  PLT 351 442* 355    Chemistry  Recent Labs Lab 07/15/17 0754 07/15/17 1357 07/16/17 0103 07/17/17 0327  NA 142 140 138  141  K 3.8 4.2 4.1 3.9  CL  --  109 107 109  CO2 22 19* 20* 22  GLUCOSE 109 134* 208* 125*  BUN 14.7 17 19  23*  CREATININE 1.0 0.98 0.97 0.87  CALCIUM 9.2 8.6* 8.5* 8.7*  PROT 6.4 6.3*  --  6.0*  ALBUMIN 3.6 3.8  --  3.6  AST 19 38  --  20  ALT 21 26  --  25  ALKPHOS 56 61  --  48  BILITOT 0.61 0.6  --  0.3  GFRNONAA  --  >60 >60 >60  GFRAA  --  >60 >60 >60  ANIONGAP 12* 12 11 10      Cardiac Enzymes  Recent Labs Lab 07/15/17 1357 07/15/17 1954 07/16/17 0103 07/16/17 0754  TROPONINI <0.03 <0.03 <0.03 <0.03     Recent Labs Lab 07/15/17 1402  TROPIPOC 0.00     Radiology    Dg Chest Portable 1 View  Result Date: 07/15/2017 CLINICAL DATA:  Was at [facility] receiving chemotherapy, back began to hurt, shaking, tachycardia, history atrial fibrillation, CLL, hypertension EXAM: PORTABLE CHEST 1 VIEW  COMPARISON:  Portable exam 1728 hours compared to 02/26/2017 FINDINGS: Enlargement of cardiac silhouette with pulmonary vascular congestion. Mediastinal contours normal. Lungs clear. No pulmonary infiltrate, pleural effusion or pneumothorax. Bones unremarkable. IMPRESSION: Enlargement of cardiac silhouette with pulmonary vascular congestion. No acute infiltrate. Electronically Signed   By: Lavonia Dana M.D.   On: 07/15/2017 17:57     Cardiac Studies   ECHO: 06/19/2017 - Left ventricle: LVEF is depressed at approxiamately 35% with severe hypokinesis of tine inferior and septal walls The cavity size was normal. Wall thickness was increased in a pattern of mild LVH. Systolic function was moderately to severely reduced. The estimated ejection fraction was in the range of 30% to 35%. - Mitral valve: There was moderate regurgitation. - Left atrium: The atrium was moderately dilated. - Right atrium: The atrium was normal in size. - Right ventricle: Systolic function was mildly to moderatelyreduced. Impressions: - No significant change in LVEF from echo of May 2018.  Patient Profile     56 y.o. female w/ hx prolonged persistent atrial fib s/p DCCV 03/2017, ?pulmonary vein tachycardia, on Eliquis w/ CHADS2VASC=3 (female, CHF, HTN), S-CHF w/ EF 35% 06/2017 echo, HTN, CLL, MR, was admitted 10/10 for rapid atrial fib, cards consulted.  Assessment & Plan    Principal Problem: 1.  Persistent atrial fibrillation with rapid ventricular response (HCC) - IV Cardizem on admit at 12.5 mg/hr>> d/c 10/11 and Coreg increased - reviewed antiarrhythmic therapy or ablation, pt not able to make a decision  - Dr Sallyanne Kuster is concerned that her afib is 2nd pulm vein tachycardia, that may make her less likely to maintain SR unless she is on antiarrhythmic - encouraged her to get a free phone app so that she can check her HR when she feels bad.  - f/u w/ Dr Sallyanne Kuster. - HR is elevated, discuss changing  Coreg to metoprolol vs adding diltiazem at low dose  2. Chronic anticoagulation - if DCCV planned, need to confirm she has not missed a dose of Eliquis - CHADS2VASC=2 (female, HTN)  3. Chronic systolic CHF - need daily wts and strict I/O - Dr Sallyanne Kuster had added Losartan with plan to change to [facility] -  renal function tolerated>>changed to Entresto - BP is tolerating   Otherwise, per IM. Plan is to d/c once HR controlled. Principal Problem:   Persistent atrial fibrillation with rapid  ventricular response (HCC) Active Problems:   CLL (chronic lymphocytic leukemia) (HCC)   Iron deficiency anemia   Depression   Hypertension   Dyslipidemia (high LDL; low HDL)   Anemia in neoplastic disease   PAF (paroxysmal atrial fibrillation) (HCC)   Hypertriglyceridemia without hypercholesterolemia   Obesity   Chronic anticoagulation   Nonischemic cardiomyopathy ([place])  Signed, Lenoard Aden 9:38 AM 07/17/2017 Pager: [phone]  The patient was seen, examined and discussed with Rosaria Ferries, PA-C and I agree with the above.   56 y.o. female with a hx of prolonged persistent atrial fib s/p DCCV 03/2017, ?pulmonary vein tachycardia, on Eliquis w/ CHADS2VASC=3 (female, CHF, HTN), S-CHF w/ EF 35% 06/2017 echo, HTN, CLL, MR, who is being seen today for the evaluation of rapid atrial fib at the request of Dr Grandville Silos. She is being followed by Dr Sallyanne Kuster and Dr Curt Bears.  She was back in atrial fib when seen by Dr Curt Bears 10/01, Coreg increased to 12.5 mg bid. ?OSA, sleep study scheduled. Anti-arrhythmics and ablation discussed, no decision. 10/08 office visit for CHF>>Entresto added, not started yet.  10/10 pt in [place] for Rituximab infusion. C/o back aches, infusion paused, IV NS given, pt developed shaking chills. ECG showed rapid atrial fib w/ HR 180s>>ER>>IV Cardizem>>HR improved. Pt admitted and cards asked to see.  Ms Constantine was in atrial when Dr Curt Bears saw her. She was  concerned about side effects from the antiarrhythmics and only 70% success rate of the ablation. She also feels overwhelmed by the need to continue chemo She has been noticing increasing DOE and her activity level has been trending down because of the DOE and fatigue.  She currently is chest pain free, no SOB, no signs of CHF.  She remains in a-fib with ventricular rates 100-130.  I will switch carvedilol to metoprolol 25 mg PO BID and start Cardizem CD 240 mg po daily.  Losartan was held.  Ena Dawley, MD 07/17/2017

## 2017-07-17 NOTE — Progress Notes (Signed)
PROGRESS NOTE    Audrey Gross  KVQ:[id number] DOB: [birth year] DOA: 07/15/2017 PCP: Janith Lima, MD    Brief Narrative:  56 y.o.femalewith medical history significant of afib, CLL undergoing chemotherapy, HTN, Depression, chronic diastolic heart failurewith EF of 30-35% per 2-D echo 9 2018 with a moderately dilated left atrium of 45 mm, with prior history of cardioversion to sinus rhythm however converted back to A. fib with RVR who was recently seen by EP on 07/06/2017.  ablation was discussed.  Patient presented to the ED from the [facility] while receiving chemotherapy and noted to have an episode of generalized shaking heart racing, diaphoresis, back pain, increased heart rate.   Chest x-ray done was negative for any acute infiltrate. EKG showed A. fib with RVR with heart rate of 199. Patient given 2 boluses of Cardizem pushes and then placed on a Cardizem drip high-grade now ranging from the low 100s to 130s. Hospitalists werecalledto admit the patient for further evaluation and management. Cardiology consulted.    Assessment & Plan:   Principal Problem:   Persistent atrial fibrillation with rapid ventricular response (HCC) Active Problems:   CLL (chronic lymphocytic leukemia) (HCC)   Iron deficiency anemia   Depression   Hypertension   Dyslipidemia (high LDL; low HDL)   Anemia in neoplastic disease   PAF (paroxysmal atrial fibrillation) (HCC)   Hypertriglyceridemia without hypercholesterolemia   Obesity   Chronic anticoagulation   Nonischemic cardiomyopathy ([place])  #1 A. fib with RVR CHA2DS2VASC score 3 Patient noted to be in A. fib with RVR with heart rates of 199 while getting chemotherapy at the cancer center on admission. Patient seen in the ED chest x-ray with no acute infiltrates. Troponin I was negative 3. Comprehensive metabolic profile was essentially unremarkable. CBC with no significant anemia.   TSH  Markedly low  At 0.069. Free T4 within normal limits  at 1.12. Will likely need repeat TFTs done in about 4-6 weeks. magnesium level was 1.9. Potassium currently at 3.9. Patient recent 2-D echo and a such will not repeat. Patient noted to have heart rates sustaining in the 130s this morning. Will check free T4. Magnesium 1.9. Potassium 4.1. Continue current regimen of Coreg. Eliquis for anticoagulation.  Cardiology following and appreciate input and recommendations.   #2 CLL - slight increase in white count noted  27>40 Patient was receiving chemotherapy when she experienced some tremors and sent to the ED. Oncology has been informed of admission via Epic. WBC trending back down and currently at 35.2 from 40 from 27.6.  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 Patient with no overt significant GI bleed. Patient denies any further bleeding. Likely hemorrhoidal in nature. Patient will need to reschedule outpatient appointment with GI.  Continue on the sitz baths and   Anusol-HC suppository.  #5 hypertension Stable. Continue Coreg, Lasix,entresto, Aldactone. Cozaar has been discontinued per cardiology.    # 6   chronicCombined systolic and diastolic heart failure  6/43/32 echocardiogram continues to show moderate to severely reduced left ventricular systolic function with EF 30-35%, no improvement compared to May 2018.Mitral regurgitation was described as moderate andthe left atrium was moderately dilated ESD 45 mm). Right ventricular systolic function was also mildly to moderately decreased Resumed home regimen Lasix at 40 mg by mouth twice  a day     DVT prophylaxis: eliquis Code Status: full Family Communication: updated patient. No family at bedside. Disposition Plan: home once heart rate is better controlled and per  cardiology.   Consultants:   Cardiology: Dr. Meda Coffee 07/16/2017  Procedures:   Chest x-ray 07/15/2017    Antimicrobials:   none   Subjective: Per nursing patient noted to have heart rates sustaining the 130s to 150s this morning.Patient denies any chest pain or shortness of breath. Patient with a flat affect. Patient answering 1 word answers to questions posed. Patient shrugs her shoulders when asked whether she is anxious.  Objective: Vitals:   07/17/17 0600 07/17/17 0700 07/17/17 0800 07/17/17 0914  BP: 137/85 (!) 102/58  (!) 128/98  Pulse: (!) 109 (!) 102  (!) 131  Resp: (!) 34 20    Temp:   97.9 F (36.6 C)   TempSrc:   Oral   SpO2: 98% 97%    Weight:      Height:        Intake/Output Summary (Last 24 hours) at 07/17/17 1017 Last data filed at 07/17/17 0500  Gross per 24 hour  Intake            427.5 ml  Output             2600 ml  Net          -2172.5 ml   Filed Weights   07/15/17 1921 07/17/17 0400  Weight: 85.8 kg (189 lb 2.5 oz) 85.7 kg (188 lb 15 oz)    Examination:  General exam: Appears calm and comfortable  Respiratory system: Clear to auscultation.no wheezes, no crackles, no rhonchi. Respiratory effort normal. Cardiovascular system: irregularly irregular. No JVD, murmurs, rubs, gallops or clicks. No pedal edema. Gastrointestinal system: Abdomen is nondistended, soft and nontender. No organomegaly or masses felt. Normal bowel sounds heard. Central nervous system: Alert and oriented. No focal neurological deficits. Extremities: Symmetric 5 x 5 power. Skin: No rashes, lesions or ulcers Psychiatry: Judgement and insight appear normal. Mood & affect flat.     Data Reviewed: I have personally reviewed following labs and imaging studies  CBC:  Recent Labs Lab 07/15/17 0754 07/15/17 1357 07/16/17 0103 07/17/17 0327  WBC 58.9* 27.6* 40.0* 35.2*  NEUTROABS 3.2 6.6  --   --   HGB 11.2* 11.5* 10.5* 10.0*  HCT 35.9 35.2* 32.5* 31.7*  MCV 87.6  86.9 86.9 86.6  PLT 364 351 442* 096   Basic Metabolic Panel:  Recent Labs Lab 07/15/17 0754 07/15/17 1357 07/15/17 1954 07/16/17 0103 07/17/17 0327  NA 142 140  --  138 141  K 3.8 4.2  --  4.1 3.9  CL  --  109  --  107 109  CO2 22 19*  --  20* 22  GLUCOSE 109 134*  --  208* 125*  BUN 14.7 17  --  19 23*  CREATININE 1.0 0.98  --  0.97 0.87  CALCIUM 9.2 8.6*  --  8.5* 8.7*  MG  --   --  1.9  --   --    GFR: Estimated Creatinine Clearance: 81.2 mL/min (by C-G formula based on SCr of 0.87 mg/dL). Liver Function Tests:  Recent Labs Lab 07/15/17 0754 07/15/17 1357 07/17/17 0327  AST 19 38 20  ALT 21 26 25   ALKPHOS 56 61 48  BILITOT 0.61 0.6 0.3  PROT 6.4 6.3* 6.0*  ALBUMIN 3.6 3.8 3.6   No results for input(s): LIPASE, AMYLASE in  the last 168 hours. No results for input(s): AMMONIA in the last 168 hours. Coagulation Profile:  Recent Labs Lab 07/16/17 0103  INR 1.40   Cardiac Enzymes:  Recent Labs Lab 07/15/17 1357 07/15/17 1954 07/16/17 0103 07/16/17 0754  TROPONINI <0.03 <0.03 <0.03 <0.03   BNP (last 3 results) No results for input(s): PROBNP in the last 8760 hours. HbA1C: No results for input(s): HGBA1C in the last 72 hours. CBG:  Recent Labs Lab 07/17/17 0748  GLUCAP 148*   Lipid Profile:  Recent Labs  07/16/17 0103  CHOL 170  HDL 31*  LDLCALC 119*  TRIG 100  CHOLHDL 5.5   Thyroid Function Tests:  Recent Labs  07/15/17 1954 07/16/17 1438  TSH 0.069*  --   FREET4  --  1.12   Anemia Panel: No results for input(s): VITAMINB12, FOLATE, FERRITIN, TIBC, IRON, RETICCTPCT in the last 72 hours. Sepsis Labs: No results for input(s): PROCALCITON, LATICACIDVEN in the last 168 hours.  Recent Results (from the past 240 hour(s))  TECHNOLOGIST REVIEW     Status: None   Collection Time: 07/15/17  7:54 AM  Result Value Ref Range Status   Technologist Review   Final    Variant lymphs, some with nucleoli and/or vacuoles, and many smudge  cells present  MRSA PCR Screening     Status: None   Collection Time: 07/15/17  7:22 PM  Result Value Ref Range Status   MRSA by PCR NEGATIVE NEGATIVE Final    Comment:        The GeneXpert MRSA Assay (FDA approved for NASAL specimens only), is one component of a comprehensive MRSA colonization surveillance program. It is not intended to diagnose MRSA infection nor to guide or monitor treatment for MRSA infections.          Radiology Studies: Dg Chest Portable 1 View  Result Date: 07/15/2017 CLINICAL DATA:  Was at [facility] receiving chemotherapy, back began to hurt, shaking, tachycardia, history atrial fibrillation, CLL, hypertension EXAM: PORTABLE CHEST 1 VIEW COMPARISON:  Portable exam 1728 hours compared to 02/26/2017 FINDINGS: Enlargement of cardiac silhouette with pulmonary vascular congestion. Mediastinal contours normal. Lungs clear. No pulmonary infiltrate, pleural effusion or pneumothorax. Bones unremarkable. IMPRESSION: Enlargement of cardiac silhouette with pulmonary vascular congestion. No acute infiltrate. Electronically Signed   By: Lavonia Dana M.D.   On: 07/15/2017 17:57        Scheduled Meds: . acyclovir  400 mg Oral Daily  . allopurinol  300 mg Oral Daily  . apixaban  5 mg Oral BID  . carvedilol  25 mg Oral BID  . cholecalciferol  1,000 Units Oral Daily  . feeding supplement (ENSURE ENLIVE)  237 mL Oral BID BM  . furosemide  40 mg Oral BID  . hydrocortisone  25 mg Rectal BID  . predniSONE  10 mg Oral Q breakfast  . sacubitril-valsartan  1 tablet Oral BID  . spironolactone  12.5 mg Oral Daily   Continuous Infusions:   LOS: 2 days    Time spent: 35 minutes    Levone Otten, MD [facility] Pager [phone]  If 7PM-7AM, please contact night-coverage [email or web address] Password [email or web address] 07/17/2017, 10:17 AM

## 2017-07-18 LAB — CBC WITH DIFFERENTIAL/PLATELET
BLASTS: 0 %
Band Neutrophils: 0 %
Basophils Absolute: 0 10*3/uL (ref 0.0–0.1)
Basophils Relative: 0 %
EOS ABS: 0 10*3/uL (ref 0.0–0.7)
Eosinophils Relative: 0 %
HEMATOCRIT: 35.4 % — AB (ref 36.0–46.0)
Hemoglobin: 11.4 g/dL — ABNORMAL LOW (ref 12.0–15.0)
LYMPHS PCT: 83 %
Lymphs Abs: 35.2 10*3/uL — ABNORMAL HIGH (ref 0.7–4.0)
MCH: 27.7 pg (ref 26.0–34.0)
MCHC: 32.2 g/dL (ref 30.0–36.0)
MCV: 86.1 fL (ref 78.0–100.0)
MYELOCYTES: 0 %
Metamyelocytes Relative: 0 %
Monocytes Absolute: 0.4 10*3/uL (ref 0.1–1.0)
Monocytes Relative: 1 %
NEUTROS ABS: 6.8 10*3/uL (ref 1.7–7.7)
NEUTROS PCT: 16 %
OTHER: 0 %
PROMYELOCYTES ABS: 0 %
Platelets: 367 10*3/uL (ref 150–400)
RBC: 4.11 MIL/uL (ref 3.87–5.11)
RDW: 16.9 % — AB (ref 11.5–15.5)
WBC: 42.4 10*3/uL — ABNORMAL HIGH (ref 4.0–10.5)
nRBC: 0 /100 WBC

## 2017-07-18 LAB — BASIC METABOLIC PANEL
Anion gap: 11 (ref 5–15)
BUN: 18 mg/dL (ref 6–20)
CALCIUM: 9 mg/dL (ref 8.9–10.3)
CO2: 25 mmol/L (ref 22–32)
CREATININE: 0.95 mg/dL (ref 0.44–1.00)
Chloride: 105 mmol/L (ref 101–111)
GFR calc non Af Amer: >60 mL/min (ref >60)
GLUCOSE: 103 mg/dL — AB (ref 65–99)
Potassium: 4.2 mmol/L (ref 3.5–5.1)
Sodium: 141 mmol/L (ref 135–145)

## 2017-07-18 LAB — MAGNESIUM: Magnesium: 2 mg/dL (ref 1.7–2.4)

## 2017-07-18 MED ORDER — DILTIAZEM HCL ER COATED BEADS 120 MG PO CP24: 300.0000 mg | ORAL_CAPSULE | Freq: Every day | ORAL | Status: DC

## 2017-07-18 MED ORDER — DILTIAZEM HCL ER COATED BEADS 120 MG PO CP24
240.0000 mg | ORAL_CAPSULE | Freq: Every day | ORAL | Status: DC
  Administered 2017-07-18 – 2017-07-19 (×2): 240 mg via ORAL
  Filled 2017-07-18 (×2): qty 2

## 2017-07-18 MED ORDER — DIPHENHYDRAMINE HCL 50 MG/ML IJ SOLN
25.0000 mg | Freq: Four times a day (QID) | INTRAMUSCULAR | Status: DC | PRN
Start: 2017-07-18 — End: 2017-07-19

## 2017-07-18 MED ORDER — DIGOXIN 0.25 MG/ML IJ SOLN
0.2500 mg | Freq: Four times a day (QID) | INTRAMUSCULAR | Status: AC
  Administered 2017-07-18 (×2): 0.25 mg via INTRAVENOUS
  Filled 2017-07-18 (×2): qty 1

## 2017-07-18 MED ORDER — DIPHENHYDRAMINE HCL 50 MG/ML IJ SOLN
INTRAMUSCULAR | Status: AC
  Administered 2017-07-18: 25 mg
  Filled 2017-07-18: qty 1

## 2017-07-18 NOTE — Progress Notes (Signed)
PROGRESS NOTE    MAUDENE STOTLER  UJW:[id number] DOB: 11/21/1960 DOA: 07/15/2017 PCP: Janith Lima, MD    Brief Narrative:  56 y.o.femalewith medical history significant of afib, CLL undergoing chemotherapy, HTN, Depression, chronic diastolic heart failurewith EF of 30-35% per 2-D echo 9 2018 with a moderately dilated left atrium of 45 mm, with prior history of cardioversion to sinus rhythm however converted back to A. fib with RVR who was recently seen by EP on 07/06/2017.  ablation was discussed.  Patient presented to the ED from the [facility] while receiving chemotherapy and noted to have an episode of generalized shaking heart racing, diaphoresis, back pain, increased heart rate.   Chest x-ray done was negative for any acute infiltrate. EKG showed A. fib with RVR with heart rate of 199. Patient given 2 boluses of Cardizem pushes and then placed on a Cardizem drip high-grade now ranging from the low 100s to 130s. Hospitalists werecalledto admit the patient for further evaluation and management. Cardiology consulted.    Subjective: Patient in bed, appears comfortable, denies any headache, no fever, no chest pain or pressure, no shortness of breath , no abdominal pain. No focal weakness.    Assessment & Plan:    #1 Chr. A. fib with RVR CHA2DS2VASC score 3  -  cardiology following, and rate control is the goal, she is currently on Eliquis along with Cardizem and switched from Coreg to Lopressor, goal is rate controlled, she has in the past declined other rate controlling agents along with abolition procedure. Case discussed with Dr. Meda Coffee on 07/18/2017 by me personally, we will give 2 doses of digoxin and monitor if her rate better discharge on 07/19/2017.  #2 CLL - slight increase in white count noted  27>40 Patient was receiving chemotherapy when she experienced some tremors and sent to the ED. Oncology has been informed of admission via Epic.  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 Stable here avoided constipation and monitor clinically.  #5 hypertension Stable. Continue Coreg, Lasix, entresto, Aldactone. Cozaar has been discontinued per cardiology.    # 6   chronicCombined systolic and diastolic heart failure  5/62/13 echocardiogram continues to show moderate to severely reduced left ventricular systolic function with EF 30-35%, no improvement compared to May 2018.Mitral regurgitation was described as moderate andthe left atrium was moderately dilated ESD 45 mm). Right ventricular systolic function was also mildly to moderately decreased Resumed home regimen Lasix at 40 mg by mouth twice a day, compensated. Continue beta blocker, diuretic and Entresto combination.     DVT prophylaxis: eliquis Code Status: full Family Communication: updated patient. No family at bedside. Disposition Plan: home once heart rate is better controlled and per cardiology.   Consultants:   Cardiology: Dr. Meda Coffee 07/16/2017  Procedures:   Chest x-ray 07/15/2017    Antimicrobials:   none    Objective: Vitals:   07/18/17 0500 07/18/17 0800 07/18/17 0913 07/18/17 0914  BP: 108/73  112/83 112/83  Pulse: 88  (!) 107   Resp: 20     Temp:  98.7 F (37.1 C)    TempSrc:  Oral    SpO2: 96%     Weight:      Height:        Intake/Output Summary (Last 24 hours) at 07/18/17  [place] filed at 07/17/17 1400  Gross per 24 hour  Intake              240 ml  Output             1075 ml  Net             -835 ml   Filed Weights   07/15/17 1921 07/17/17 0400 07/18/17 0437  Weight: 85.8 kg (189 lb 2.5 oz) 85.7 kg (188 lb 15 oz) 83.2 kg (183 lb 6.8 oz)    Examination:   Awake Alert, Oriented X 3, No new F.N deficits, Flat affect [place].AT,PERRAL Supple  Neck,No JVD, No cervical lymphadenopathy appriciated.  Symmetrical Chest wall movement, Good air movement bilaterally, CTAB iRRR,No Gallops,Rubs or new Murmurs, No Parasternal Heave +ve B.Sounds, Abd Soft, No tenderness, No organomegaly appriciated, No rebound - guarding or rigidity. No Cyanosis, Clubbing or edema, No new Rash or bruise     Data Reviewed: I have personally reviewed following labs and imaging studies  CBC:  Recent Labs Lab 07/15/17 0754 07/15/17 1357 07/16/17 0103 07/17/17 0327 07/18/17 0326  WBC 58.9* 27.6* 40.0* 35.2* 42.4*  NEUTROABS 3.2 6.6  --   --  6.8  HGB 11.2* 11.5* 10.5* 10.0* 11.4*  HCT 35.9 35.2* 32.5* 31.7* 35.4*  MCV 87.6 86.9 86.9 86.6 86.1  PLT 364 351 442* 355 026   Basic Metabolic Panel:  Recent Labs Lab 07/15/17 0754 07/15/17 1357 07/15/17 1954 07/16/17 0103 07/17/17 0327 07/18/17 0326  NA 142 140  --  138 141 141  K 3.8 4.2  --  4.1 3.9 4.2  CL  --  109  --  107 109 105  CO2 22 19*  --  20* 22 25  GLUCOSE 109 134*  --  208* 125* 103*  BUN 14.7 17  --  19 23* 18  CREATININE 1.0 0.98  --  0.97 0.87 0.95  CALCIUM 9.2 8.6*  --  8.5* 8.7* 9.0  MG  --   --  1.9  --   --  2.0   GFR: Estimated Creatinine Clearance: 73.3 mL/min (by C-G formula based on SCr of 0.95 mg/dL). Liver Function Tests:  Recent Labs Lab 07/15/17 0754 07/15/17 1357 07/17/17 0327  AST 19 38 20  ALT 21 26 25   ALKPHOS 56 61 48  BILITOT 0.61 0.6 0.3  PROT 6.4 6.3* 6.0*  ALBUMIN 3.6 3.8 3.6   No results for input(s): LIPASE, AMYLASE in the last 168 hours. No results for input(s): AMMONIA in the last 168 hours. Coagulation Profile:  Recent Labs Lab 07/16/17 0103  INR 1.40   Cardiac Enzymes:  Recent Labs Lab 07/15/17 1357 07/15/17 1954 07/16/17 0103 07/16/17 0754  TROPONINI <0.03 <0.03 <0.03 <0.03   BNP (last 3 results) No results for input(s): PROBNP in the last 8760 hours. HbA1C: No results for input(s): HGBA1C in the last 72  hours. CBG:  Recent Labs Lab 07/17/17 0748 07/17/17 1233 07/17/17 1622  GLUCAP 148* 107* 186*   Lipid Profile:  Recent Labs  07/16/17 0103  CHOL 170  HDL 31*  LDLCALC 119*  TRIG 100  CHOLHDL 5.5   Thyroid Function Tests:  Recent Labs  07/15/17 1954 07/16/17 1438  TSH 0.069*  --   FREET4  --  1.12   Anemia Panel: No results for input(s): VITAMINB12, FOLATE, FERRITIN, TIBC, IRON, RETICCTPCT in the last 72 hours. Sepsis Labs: No results for input(s): PROCALCITON, LATICACIDVEN in the last 168 hours.  Recent Results (from the past 240 hour(s))  TECHNOLOGIST REVIEW     Status: None   Collection Time: 07/15/17  7:54 AM  Result Value Ref Range Status   Technologist Review   Final    Variant lymphs, some with nucleoli and/or vacuoles, and many smudge cells present  MRSA PCR Screening     Status: None   Collection Time: 07/15/17  7:22 PM  Result Value Ref Range Status   MRSA by PCR NEGATIVE NEGATIVE Final    Comment:        The GeneXpert MRSA Assay (FDA approved for NASAL specimens only), is one component of a comprehensive MRSA colonization surveillance program. It is not intended to diagnose MRSA infection nor to guide or monitor treatment for MRSA infections.          Radiology Studies: No results found.      Scheduled Meds: . acyclovir  400 mg Oral Daily  . allopurinol  300 mg Oral Daily  . apixaban  5 mg Oral BID  . cholecalciferol  1,000 Units Oral Daily  . digoxin  0.25 mg Intravenous Q6H  . diltiazem  240 mg Oral Daily  . feeding supplement (ENSURE ENLIVE)  237 mL Oral BID BM  . furosemide  40 mg Oral BID  . hydrocortisone  25 mg Rectal BID  . metoprolol tartrate  25 mg Oral BID  . predniSONE  10 mg Oral Q breakfast  . sacubitril-valsartan  1 tablet Oral BID  . spironolactone  12.5 mg Oral Daily   Continuous Infusions:   LOS: 3 days    Time spent: 35 minutes  Signature  Lala Lund M.D on 07/18/2017 at 10:49 AM  Between  7am to 7pm - Pager - [phone] ( page via [email or web address], text pages only, please mention full 10 digit call back number).  After 7pm go to [email or web address] - password [email or web address]

## 2017-07-18 NOTE — Progress Notes (Signed)
Progress Note  Patient Name: Audrey Gross Date of Encounter: 07/18/2017  Primary Cardiologist: Dr Sallyanne Kuster, 06/26/2017 Primary Electrophysiologist:  Dr Curt Bears, 07/06/2017  Patient Profile     56 y.o. female w/ hx prolonged persistent atrial fib s/p DCCV 03/2017, ?pulmonary vein tachycardia, on Eliquis w/ CHADS2VASC=3 (female, CHF, HTN), S-CHF w/ EF 35% 06/2017 echo, HTN, CLL, MR, was admitted 10/10 for rapid atrial fib, cards consulted.  Subjective   SOB with just sitting, she hasn't walked yet.  Inpatient Medications    Scheduled Meds: . acyclovir  400 mg Oral Daily  . allopurinol  300 mg Oral Daily  . apixaban  5 mg Oral BID  . cholecalciferol  1,000 Units Oral Daily  . diltiazem  180 mg Oral Daily  . feeding supplement (ENSURE ENLIVE)  237 mL Oral BID BM  . furosemide  40 mg Oral BID  . hydrocortisone  25 mg Rectal BID  . metoprolol tartrate  25 mg Oral BID  . predniSONE  10 mg Oral Q breakfast  . sacubitril-valsartan  1 tablet Oral BID  . spironolactone  12.5 mg Oral Daily   Continuous Infusions:  PRN Meds: acetaminophen, ondansetron (ZOFRAN) IV, ondansetron, prochlorperazine   Vital Signs    Vitals:   07/18/17 0400 07/18/17 0437 07/18/17 0500 07/18/17 0800  BP: 131/76  108/73   Pulse: 80  88   Resp: (!) 23  20   Temp:  98.2 F (36.8 C)  98.7 F (37.1 C)  TempSrc:  Oral  Oral  SpO2: 96%  96%   Weight:  183 lb 6.8 oz (83.2 kg)    Height:        Intake/Output Summary (Last 24 hours) at 07/18/17 0824 Last data filed at 07/17/17 1400  Gross per 24 hour  Intake              240 ml  Output             1075 ml  Net             -835 ml   Filed Weights   07/15/17 1921 07/17/17 0400 07/18/17 0437  Weight: 189 lb 2.5 oz (85.8 kg) 188 lb 15 oz (85.7 kg) 183 lb 6.8 oz (83.2 kg)    Telemetry    Atrial fib, generally RVR - Personally Reviewed  ECG    10/10 at 13:02 pm> atrial fib, RVR, HR 201; 10/11 ECG> Atrial fib, HR 84; personally  reviewed.  Physical Exam   General: Well developed, well nourished, female appearing in no acute distress. Head: Normocephalic, atraumatic.  Neck: Supple without bruits, JVD not elevated. Lungs:  Resp regular and unlabored, CTA. Heart: rapid and irreg R&R, S1, S2, no S3, S4, or murmur; no rub. Abdomen: Soft, non-tender, non-distended with normoactive bowel sounds. No hepatomegaly. No rebound/guarding. No obvious abdominal masses. Extremities: No clubbing, cyanosis, no edema. Distal pedal pulses are 2+ bilaterally. Neuro: Alert and oriented X 3. Moves all extremities spontaneously. Psych: Normal affect.  Labs    Hematology  Recent Labs Lab 07/16/17 0103 07/17/17 0327 07/18/17 0326  WBC 40.0* 35.2* 42.4*  RBC 3.74* 3.66* 4.11  HGB 10.5* 10.0* 11.4*  HCT 32.5* 31.7* 35.4*  MCV 86.9 86.6 86.1  MCH 28.1 27.3 27.7  MCHC 32.3 31.5 32.2  RDW 16.8* 16.7* 16.9*  PLT 442* 355 367    Chemistry  Recent Labs Lab 07/15/17 0754  07/15/17 1357 07/16/17 0103 07/17/17 0327 07/18/17 0326  NA 142  < > 140  138 141 141  K 3.8  < > 4.2 4.1 3.9 4.2  CL  --   < > 109 107 109 105  CO2 22  < > 19* 20* 22 25  GLUCOSE 109  < > 134* 208* 125* 103*  BUN 14.7  < > 17 19 23* 18  CREATININE 1.0  < > 0.98 0.97 0.87 0.95  CALCIUM 9.2  < > 8.6* 8.5* 8.7* 9.0  PROT 6.4  --  6.3*  --  6.0*  --   ALBUMIN 3.6  --  3.8  --  3.6  --   AST 19  --  38  --  20  --   ALT 21  --  26  --  25  --   ALKPHOS 56  --  61  --  48  --   BILITOT 0.61  --  0.6  --  0.3  --   GFRNONAA  --   < > >60 >60 >60 >60  GFRAA  --   < > >60 >60 >60 >60  ANIONGAP 12*  < > 12 11 10 11   < > = values in this interval not displayed.   Cardiac Enzymes  Recent Labs Lab 07/15/17 1357 07/15/17 1954 07/16/17 0103 07/16/17 0754  TROPONINI <0.03 <0.03 <0.03 <0.03     Recent Labs Lab 07/15/17 1402  TROPIPOC 0.00     Radiology    Dg Chest Portable 1 View  Result Date: 07/15/2017 CLINICAL DATA:  Was at [facility]  receiving chemotherapy, back began to hurt, shaking, tachycardia, history atrial fibrillation, CLL, hypertension EXAM: PORTABLE CHEST 1 VIEW COMPARISON:  Portable exam 1728 hours compared to 02/26/2017 FINDINGS: Enlargement of cardiac silhouette with pulmonary vascular congestion. Mediastinal contours normal. Lungs clear. No pulmonary infiltrate, pleural effusion or pneumothorax. Bones unremarkable. IMPRESSION: Enlargement of cardiac silhouette with pulmonary vascular congestion. No acute infiltrate. Electronically Signed   By: Lavonia Dana M.D.   On: 07/15/2017 17:57     Cardiac Studies   ECHO: 06/19/2017 - Left ventricle: LVEF is depressed at approxiamately 35% with severe hypokinesis of tine inferior and septal walls The cavity size was normal. Wall thickness was increased in a pattern of mild LVH. Systolic function was moderately to severely reduced. The estimated ejection fraction was in the range of 30% to 35%. - Mitral valve: There was moderate regurgitation. - Left atrium: The atrium was moderately dilated. - Right atrium: The atrium was normal in size. - Right ventricle: Systolic function was mildly to moderatelyreduced. Impressions: - No significant change in LVEF from echo of May 2018.  Patient Profile     56 y.o. female w/ hx prolonged persistent atrial fib s/p DCCV 03/2017, ?pulmonary vein tachycardia, on Eliquis w/ CHADS2VASC=3 (female, CHF, HTN), S-CHF w/ EF 35% 06/2017 echo, HTN, CLL, MR, was admitted 10/10 for rapid atrial fib, cards consulted.  Assessment & Plan    Principal Problem:   Persistent atrial fibrillation with rapid ventricular response (HCC) Active Problems:   CLL (chronic lymphocytic leukemia) (HCC)   Iron deficiency anemia   Depression   Hypertension   Dyslipidemia (high LDL; low HDL)   Anemia in neoplastic disease   PAF (paroxysmal atrial fibrillation) (HCC)   Hypertriglyceridemia without hypercholesterolemia   Obesity   Chronic  anticoagulation   Nonischemic cardiomyopathy ([place])  Principal Problem: 1.  Persistent atrial fibrillation with rapid ventricular response (HCC) - IV Cardizem on admit at 12.5 mg/hr>> d/c 10/11 and Coreg increased - reviewed antiarrhythmic therapy or  ablation, pt not able to make a decision  - Dr Sallyanne Kuster is concerned that her afib is 2nd pulm vein tachycardia, that may make her less likely to maintain SR unless she is on antiarrhythmic - encouraged her to get a free phone app so that she can check her HR when she feels bad.  - f/u w/ Dr Sallyanne Kuster. - HR is elevated, discuss changing Coreg to metoprolol vs adding diltiazem at low dose  2. Chronic anticoagulation - if DCCV planned, need to confirm she has not missed a dose of Eliquis - CHADS2VASC=2 (female, HTN)  3. Chronic systolic CHF - need daily wts and strict I/O - Dr Sallyanne Kuster had added Losartan with plan to change to [facility]  56 y.o. female with a hx of prolonged persistent atrial fib s/p DCCV 03/2017, ?pulmonary vein tachycardia, on Eliquis w/ CHADS2VASC=3 (female, CHF, HTN), S-CHF w/ EF 35% 06/2017 echo, HTN, CLL, MR, who is being seen today for the evaluation of rapid atrial fib at the request of Dr Grandville Silos. She is being followed by Dr Sallyanne Kuster and Dr Curt Bears.  She was back in atrial fib when seen by Dr Curt Bears 10/01, Coreg increased to 12.5 mg bid. ?OSA, sleep study scheduled. Anti-arrhythmics and ablation discussed, no decision. 10/08 office visit for CHF>>Entresto added, not started yet.  10/10 pt in [place] for Rituximab infusion. C/o back aches, infusion paused, IV NS given, pt developed shaking chills. ECG showed rapid atrial fib w/ HR 180s>>ER>>IV Cardizem>>HR improved. Pt admitted and cards asked to see.  Ms Wachs was in atrial when Dr Curt Bears saw her. She was concerned about side effects from the antiarrhythmics and only 70% success rate of the ablation. She also feels overwhelmed by the need to continue chemo.  She  remains in a-fib with ventricular rates 90-110, improved, I switched carvedilol to metoprolol 25 mg PO BID yesterday and will  start Cardizem CD 240 mg po daily today.  We will order physical therapy to evaluate HR once she walks.  Ena Dawley, MD 07/18/2017

## 2017-07-19 LAB — BASIC METABOLIC PANEL
Anion gap: 10 (ref 5–15)
BUN: 21 mg/dL — AB (ref 6–20)
CHLORIDE: 100 mmol/L — AB (ref 101–111)
CO2: 29 mmol/L (ref 22–32)
CREATININE: 0.89 mg/dL (ref 0.44–1.00)
Calcium: 8.9 mg/dL (ref 8.9–10.3)
GFR calc Af Amer: >60 mL/min (ref >60)
GFR calc non Af Amer: >60 mL/min (ref >60)
GLUCOSE: 90 mg/dL (ref 65–99)
POTASSIUM: 4.5 mmol/L (ref 3.5–5.1)
SODIUM: 139 mmol/L (ref 135–145)

## 2017-07-19 MED ORDER — DILTIAZEM HCL ER COATED BEADS 240 MG PO CP24: 240.0000 mg | ORAL_CAPSULE | Freq: Every day | ORAL | 0 refills | Status: DC

## 2017-07-19 MED ORDER — DIGOXIN 0.25 MG/ML IJ SOLN
0.2500 mg | Freq: Once | INTRAMUSCULAR | Status: AC
  Administered 2017-07-19: 0.25 mg via INTRAVENOUS
  Filled 2017-07-19: qty 1

## 2017-07-19 MED ORDER — METOPROLOL TARTRATE 25 MG PO TABS: 25.0000 mg | ORAL_TABLET | Freq: Two times a day (BID) | ORAL | 0 refills | Status: DC

## 2017-07-19 MED ORDER — SACUBITRIL-VALSARTAN 24-26 MG PO TABS: 1.0000 | ORAL_TABLET | Freq: Two times a day (BID) | ORAL | 0 refills | Status: DC

## 2017-07-19 NOTE — Discharge Instructions (Signed)
Follow with Primary MD Janith Lima, MD in 7 days   Get CBC, CMP,  checked  by Primary MD  in 5-7 days    Activity: As tolerated with Full fall precautions use walker/cane & assistance as needed  Disposition Home    Diet:   Heart Healthy   Check your Weight same time everyday, if you gain over 2 pounds, or you develop in leg swelling, experience more shortness of breath or chest pain, call your Primary MD immediately. Follow Cardiac Low Salt Diet and 1.5 lit/day fluid restriction.  On your next visit with your primary care physician please Get Medicines reviewed and adjusted.  Please request your Prim.MD to go over all Hospital Tests and Procedure/Radiological results at the follow up, please get all Hospital records sent to your Prim MD by signing hospital release before you go home.  If you experience worsening of your admission symptoms, develop shortness of breath, life threatening emergency, suicidal or homicidal thoughts you must seek medical attention immediately by calling 911 or calling your MD immediately  if symptoms less severe.  You Must read complete instructions/literature along with all the possible adverse reactions/side effects for all the Medicines you take and that have been prescribed to you. Take any new Medicines after you have completely understood and accpet all the possible adverse reactions/side effects.   Do not drive, operate heavy machinery, perform activities at heights, swimming or participation in water activities or provide baby sitting services if your were admitted for syncope or siezures until you have seen by Primary MD or a Neurologist and advised to do so again.  Do not drive when taking Pain medications.    Do not take more than prescribed Pain, Sleep and Anxiety Medications  Special Instructions: If you have smoked or chewed Tobacco  in the last 2 yrs please stop smoking, stop any regular Alcohol  and or any Recreational drug use.  Wear Seat  belts while driving.   Please note  You were cared for by a hospitalist during your hospital stay. If you have any questions about your discharge medications or the care you received while you were in the hospital after you are discharged, you can call the unit and asked to speak with the hospitalist on call if the hospitalist that took care of you is not available. Once you are discharged, your primary care physician will handle any further medical issues. Please note that NO REFILLS for any discharge medications will be authorized once you are discharged, as it is imperative that you return to your primary care physician (or establish a relationship with a primary care physician if you do not have one) for your aftercare needs so that they can reassess your need for medications and monitor your lab values.

## 2017-07-19 NOTE — Discharge Summary (Signed)
Audrey Gross HWE:[id number] DOB: 10-05-1961 DOA: 07/15/2017  PCP: Janith Lima, MD  Admit date: 07/15/2017  Discharge date: 07/19/2017  Admitted From: Home   Disposition:  Home   Recommendations for Outpatient Follow-up:   Follow up with PCP in 1-2 weeks  PCP Please obtain BMP/CBC, 2 view CXR in 1week,  (see Discharge instructions)   PCP Please follow up on the following pending results:    Home Health: None   Equipment/Devices: None  Consultations: Cards Discharge Condition: Fair   CODE STATUS: Full   Diet Recommendation:  Heart Healthy    Chief Complaint  Patient presents with  . Tachycardia     Brief history of present illness from the day of admission and additional interim summary    56 y.o.femalewith medical history significant of afib, CLL undergoing chemotherapy, HTN, Depression, chronic diastolic heart failurewith EF of 30-35% per 2-D echo 9 2018 with a moderately dilated left atrium of 45 mm, with prior history of cardioversion to sinus rhythm however converted back to A. fib with RVR who was recently seen by EP on 07/06/2017. ablation was discussed.   Patient presented to the ED from the [facility] while receiving chemotherapy and noted to have an episode of generalized shaking, heart racing, diaphoresis, back pain.Chest x-ray done was negative for any acute infiltrate. EKG showed A. fib with RVR with heart rate of 199. Patient given 2 boluses of Cardizem pushes and then placed on a Cardizem drip high-grade now ranging from the low 100s to 130s. Hospitalists werecalledto admit the patient for further evaluation and management. Cardiology was consulted.according to the patient she never felt any palpitations but was told that her heart was racing when vital signs were checked.                                                              Hospital Course     #1 Chr. A. fib with RVR CHA2DS2VASC score 3  -  cardiology following, and rate control is the goal, she is currently on Eliquis along with Cardizem and switched from Coreg to Lopressor, goal is rate controlled, she has in the past declined other rate controlling agents along with abolition procedure. Case discussed with Dr. Meda Coffee on 07/18/2017 & again on 07/19/2017, I have looked through patient's past chart when she visited Dr. Baird Kay EP in the office a few months ago her resting heart rate was 137, she has remained tachycardic throughout for several months at baseline, her resting heart rate now is between 90 to 105. She is not feeling palpitations, and is symptom-free, will be discharged on Cardizem, Lopressor present dose, received 3 doses of digoxin here. Encouraged her to follow with EP outpatient and follow with their recommendations.   #2 CLL - slight increase in white count noted 27>40  Patient was receiving chemotherapy when she experienced some tremors and sent to the ED. Oncology has been informed of admission via Epic.  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 Stable here no acute issues.  #5 hypertension  Stable currently on combination of Lopressor, Cardizem, Lasix, entresto, Aldactone. Cozaar has been discontinued per cardiology.  # 6 ChronicCombined systolic and diastolic heart failure  9/37/[id number] continues to show moderate to severely reduced left ventricular systolic function with EF 30-35%, no improvement compared to May 2018.Mitral regurgitation was described as moderate andthe left atrium was moderately dilated ESD 45 mm). Right ventricular systolic function was also mildly to  moderately decreased Resumed home regimen Lasix at 40 mg by mouth twice a day, compensated. Continue beta blocker, diuretic and Entresto combination.  #7. Extremely flat affect Not suicidal homicidal but I think she has underlying depression, request BCP to strongly consider outpatient psych follow-up.    Discharge diagnosis     Principal Problem:   Persistent atrial fibrillation with rapid ventricular response (HCC) Active Problems:   CLL (chronic lymphocytic leukemia) (HCC)   Iron deficiency anemia   Depression   Hypertension   Dyslipidemia (high LDL; low HDL)   Anemia in neoplastic disease   PAF (paroxysmal atrial fibrillation) (HCC)   Hypertriglyceridemia without hypercholesterolemia   Obesity   Chronic anticoagulation   Nonischemic cardiomyopathy [facility])    Discharge instructions    Discharge Instructions    Discharge instructions    Complete by:  As directed    Follow with Primary MD Janith Lima, MD in 7 days   Get CBC, CMP,  checked  by Primary MD  in 5-7 days    Activity: As tolerated with Full fall precautions use walker/cane & assistance as needed  Disposition Home    Diet:   Heart Healthy   Check your Weight same time everyday, if you gain over 2 pounds, or you develop in leg swelling, experience more shortness of breath or chest pain, call your Primary MD immediately. Follow Cardiac Low Salt Diet and 1.5 lit/day fluid restriction.  On your next visit with your primary care physician please Get Medicines reviewed and adjusted.  Please request your Prim.MD to go over all Hospital Tests and Procedure/Radiological results at the follow up, please get all Hospital records sent to your Prim MD by signing hospital release before you go home.  If you experience worsening of your admission symptoms, develop shortness of breath, life threatening emergency, suicidal or homicidal thoughts you must seek medical attention immediately by calling 911 or calling your MD  immediately  if symptoms less severe.  You Must read complete instructions/literature along with all the possible adverse reactions/side effects for all the Medicines you take and that have been prescribed to you. Take any new Medicines after you have completely understood and accpet all the possible adverse reactions/side effects.   Do not drive, operate heavy machinery, perform activities at heights, swimming or participation in water activities or provide baby sitting services if your were admitted for syncope or siezures until you have seen by Primary MD or a Neurologist and advised to do so again.  Do not drive when taking Pain medications.    Do not take more than prescribed Pain, Sleep and Anxiety Medications  Special Instructions: If you  have smoked or chewed Tobacco  in the last 2 yrs please stop smoking, stop any regular Alcohol  and or any Recreational drug use.  Wear Seat belts while driving.   Please note  You were cared for by a hospitalist during your hospital stay. If you have any questions about your discharge medications or the care you received while you were in the hospital after you are discharged, you can call the unit and asked to speak with the hospitalist on call if the hospitalist that took care of you is not available. Once you are discharged, your primary care physician will handle any further medical issues. Please note that NO REFILLS for any discharge medications will be authorized once you are discharged, as it is imperative that you return to your primary care physician (or establish a relationship with a primary care physician if you do not have one) for your aftercare needs so that they can reassess your need for medications and monitor your lab values.   Increase activity slowly    Complete by:  As directed       Discharge Medications   Allergies as of 07/19/2017   No Known Allergies     Medication List    STOP taking these medications   carvedilol  12.5 MG tablet Commonly known as:  COREG   losartan 25 MG tablet Commonly known as:  COZAAR     TAKE these medications   acyclovir 400 MG tablet Commonly known as:  ZOVIRAX Take 1 tablet (400 mg total) by mouth daily.   allopurinol 300 MG tablet Commonly known as:  ZYLOPRIM Take 1 tablet (300 mg total) by mouth daily.   apixaban 5 MG Tabs tablet Commonly known as:  ELIQUIS Take 1 tablet (5 mg total) by mouth 2 (two) times daily.   diltiazem 240 MG 24 hr capsule Commonly known as:  CARDIZEM CD Take 1 capsule (240 mg total) by mouth daily.   furosemide 40 MG tablet Commonly known as:  LASIX Take 1 tablet (40 mg total) by mouth daily.   medroxyPROGESTERone 5 MG tablet Commonly known as:  PROVERA TAKE 1 TABLET BY MOUTH DAILY FOR 5 DAYS EVERY OTHER MONTH IF NO SPONTANEOUS MENSES   metoprolol tartrate 25 MG tablet Commonly known as:  LOPRESSOR Take 1 tablet (25 mg total) by mouth 2 (two) times daily.   ondansetron 8 MG tablet Commonly known as:  ZOFRAN Take 1 tablet (8 mg total) by mouth every 8 (eight) hours as needed for refractory nausea / vomiting.   predniSONE 10 MG tablet Commonly known as:  DELTASONE TAKE 1 TABLET BY MOUTH DAILY WITH BREAKFAST   prochlorperazine 10 MG tablet Commonly known as:  COMPAZINE Take 1 tablet (10 mg total) by mouth every 6 (six) hours as needed (Nausea or vomiting).   sacubitril-valsartan 24-26 MG Commonly known as:  ENTRESTO Take 1 tablet by mouth 2 (two) times daily.   spironolactone 25 MG tablet Commonly known as:  ALDACTONE Take 0.5 tablets (12.5 mg total) by mouth daily.   Vitamin D (Cholecalciferol) 1000 units Caps Take 1,000 Units by mouth every morning.       Follow-up Information    Constance Haw, MD Follow up.   Specialty:  Cardiology Why:  The office will contact you to arrange Cardiology follow-up. If you do not hear from them within 3 business days, please contact the number provided.  Contact  information: [address] [place] [address] [phone]  Janith Lima, MD. Schedule an appointment as soon as possible for a visit in 1 week(s).   Specialty:  Internal Medicine Contact information: [address] [address] [phone]        Heath Lark, MD. Schedule an appointment as soon as possible for a visit in 1 week(s).   Specialty:  Hematology and Oncology Contact information: [place] [address] [phone]           Major procedures and Radiology Reports - PLEASE review detailed and final reports thoroughly  -         Dg Esophagus  Result Date: 06/23/2017 CLINICAL DATA:  Dysphagia.  Lymphoma. EXAM: ESOPHOGRAM / BARIUM SWALLOW / BARIUM TABLET STUDY TECHNIQUE: Combined double contrast and single contrast examination performed using effervescent crystals, thick barium liquid, and thin barium liquid. The patient was observed with fluoroscopy swallowing a 13 mm barium sulphate tablet. FLUOROSCOPY TIME:  Fluoroscopy Time:  2.3 minutes Radiation Exposure Index (if provided by the fluoroscopic device): 167.5 mGy Number of Acquired Spot Images: 0 COMPARISON:  PET-CT 05/27/2017. FINDINGS: Frontal and lateral views of the hypopharynx while swallowing are unremarkable. Double contrast imaging of the esophagus limited by patient difficulty and rapidly consuming effervescent crystals and barium. No gross esophageal diverticulum or focal mucosal ulceration is evident. There is some mass-effect on the distal esophagus likely related to cardiomegaly has no central mediastinal lymphadenopathy or mass lesion is evident on the PET-CT from 4 weeks ago. Esophageal motility is normal. 13 mm barium tablet passes readily into the stomach when taken with water. No evidence for hiatal hernia. IMPRESSION: Unremarkable double contrast barium esophagram with some limitation due to patient inability to rapidly consume  effervescent crystals and barium. Electronically Signed   By: Misty Stanley M.D.   On: 06/23/2017 10:36   Dg Chest Portable 1 View  Result Date: 07/15/2017 CLINICAL DATA:  Was at [facility] receiving chemotherapy, back began to hurt, shaking, tachycardia, history atrial fibrillation, CLL, hypertension EXAM: PORTABLE CHEST 1 VIEW COMPARISON:  Portable exam 1728 hours compared to 02/26/2017 FINDINGS: Enlargement of cardiac silhouette with pulmonary vascular congestion. Mediastinal contours normal. Lungs clear. No pulmonary infiltrate, pleural effusion or pneumothorax. Bones unremarkable. IMPRESSION: Enlargement of cardiac silhouette with pulmonary vascular congestion. No acute infiltrate. Electronically Signed   By: Lavonia Dana M.D.   On: 07/15/2017 17:57   Mm Screening Breast Tomo Bilateral  Result Date: 07/02/2017 CLINICAL DATA:  Screening. EXAM: 2D DIGITAL SCREENING BILATERAL MAMMOGRAM WITH CAD AND ADJUNCT TOMO COMPARISON:  Previous exam(s). ACR Breast Density Category c: The breast tissue is heterogeneously dense, which may obscure small masses. FINDINGS: In the right breast   requires further evaluation. In the left breast   requires further evaluation. Images were processed with CAD. IMPRESSION: Further evaluation is suggested for possible enlarged axillary lymph nodes in the right breast. Further evaluation is suggested for possible enlarged axillary lymph nodes in the left breast. RECOMMENDATION: Ultrasound of both breasts. (Code:[id number]-70M) The patient will be contacted regarding the findings, and additional imaging will be scheduled. BI-RADS CATEGORY  0: Incomplete. Need additional imaging evaluation and/or prior mammograms for comparison. Electronically Signed   By: Abelardo Diesel M.D.   On: 07/02/2017 12:22    Micro Results     Recent Results (from the past 240 hour(s))  TECHNOLOGIST REVIEW     Status: None   Collection Time: 07/15/17  7:54 AM  Result Value Ref Range Status    Technologist Review  Final    Variant lymphs, some with nucleoli and/or vacuoles, and many smudge cells present  MRSA PCR Screening     Status: None   Collection Time: 07/15/17  7:22 PM  Result Value Ref Range Status   MRSA by PCR NEGATIVE NEGATIVE Final    Comment:        The GeneXpert MRSA Assay (FDA approved for NASAL specimens only), is one component of a comprehensive MRSA colonization surveillance program. It is not intended to diagnose MRSA infection nor to guide or monitor treatment for MRSA infections.     Today   Subjective    Audrey Gross today has no headache,no chest abdominal pain,no new weakness tingling or numbness, feels much better wants to go home today    Objective   Blood pressure 105/69, pulse (!) 102, temperature 98.6 F (37 C), temperature source Oral, resp. rate (!) 31, height 5\' 7"  (1.702 m), weight 83.9 kg (184 lb 15.5 oz), SpO2 97 %.   Intake/Output Summary (Last 24 hours) at 07/19/17 1113 Last data filed at 07/18/17 2253  Gross per 24 hour  Intake              120 ml  Output              150 ml  Net              -30 ml    Exam Awake Alert, Oriented x 3, No new F.N deficits, flat affect [employer].AT,PERRAL Supple Neck,No JVD, No cervical lymphadenopathy appriciated.  Symmetrical Chest wall movement, Good air movement bilaterally, CTAB iRRR,No Gallops,Rubs or new Murmurs, No Parasternal Heave +ve B.Sounds, Abd Soft, Non tender, No organomegaly appriciated, No rebound -guarding or rigidity. No Cyanosis, Clubbing or edema, No new Rash or bruise   Data Review   CBC w Diff: Lab Results  Component Value Date   WBC 42.4 (H) 07/18/2017   HGB 11.4 (L) 07/18/2017   HGB 11.2 (L) 07/15/2017   HCT 35.4 (L) 07/18/2017   HCT 35.9 07/15/2017   PLT 367 07/18/2017   PLT 364 07/15/2017   PLT 266 03/23/2017   LYMPHOPCT 83 07/18/2017   LYMPHOPCT 91.8 (H) 07/15/2017   BANDSPCT 0 07/18/2017   MONOPCT 1 07/18/2017   MONOPCT 1.5 07/15/2017   EOSPCT 0  07/18/2017   EOSPCT 0.1 07/15/2017   BASOPCT 0 07/18/2017   BASOPCT 1.1 07/15/2017    CMP: Lab Results  Component Value Date   NA 139 07/19/2017   NA 142 07/15/2017   K 4.5 07/19/2017   K 3.8 07/15/2017   CL 100 (L) 07/19/2017   CL 105 01/18/2013   CO2 29 07/19/2017   CO2 22 07/15/2017   BUN 21 (H) 07/19/2017   BUN 14.7 07/15/2017   CREATININE 0.89 07/19/2017   CREATININE 1.0 07/15/2017   PROT 6.0 (L) 07/17/2017   PROT 6.4 07/15/2017   ALBUMIN 3.6 07/17/2017   ALBUMIN 3.6 07/15/2017   BILITOT 0.3 07/17/2017   BILITOT 0.61 07/15/2017   ALKPHOS 48 07/17/2017   ALKPHOS 56 07/15/2017   AST 20 07/17/2017   AST 19 07/15/2017   ALT 25 07/17/2017   ALT 21 07/15/2017  .   Total Time in preparing paper work, data evaluation and todays exam - 60 minutes  Lala Lund M.D on 07/19/2017 at [place]  [phone]

## 2017-07-19 NOTE — Care Management Note (Signed)
Case Management Note  Patient Details  Name: Audrey Gross MRN: [id number] Date of Birth: 1960-10-24  Subjective/Objective:     afib with RVR, CLL, anemia, CHF, hypertension               Action/Plan: Discharge Planning: NCM spoke to pt and provided pt with [facility] [place] package with drug information and 30 day free trial card and $10 copay card to use at pharmacy. States she has some samples from the doctor's office. Offered choice for HH/list provided. Pt agreeable to [facility] for [facility]. Pt states she lives alone but has good family support. Contacted [facility] with new referral.   PCP Janith Lima MD  Expected Discharge Date:  07/19/17               Expected Discharge Plan:  [place]  In-House Referral:  NA  Discharge planning Services  CM Consult, Medication Assistance  Post Acute Care Choice:  Home Health Choice offered to:  Patient  DME Arranged:  N/A DME Agency:  NA  HH Arranged:  RN [place] Agency:  [place]  Status of Service:  Completed, signed off  If discussed at [place] of Stay Meetings, dates discussed:    Additional Comments:  Erenest Rasher, RN 07/19/2017, 12:31 PM

## 2017-07-20 ENCOUNTER — Other Ambulatory Visit: Payer: Self-pay | Admitting: Hematology and Oncology

## 2017-07-20 ENCOUNTER — Telehealth: Payer: Self-pay | Admitting: *Deleted

## 2017-07-20 NOTE — Telephone Encounter (Signed)
Pt states she is OK with 10/23 for treatment

## 2017-07-20 NOTE — Telephone Encounter (Signed)
-----   Message from Heath Lark, MD sent at 07/20/2017  7:39 AM EDT ----- Regarding: missed treatment last week She was in the hospital due to infusion reaction I have to reschedule her Rx. I can see her with treatment on 10/23. Can you call her and ask if it is OK? If so, I will place scheduling msg

## 2017-07-22 ENCOUNTER — Ambulatory Visit
Admission: RE | Admit: 2017-07-22 | Discharge: 2017-07-22 | Disposition: A | Discharge status: Home / Self Care | Payer: BLUE CROSS/BLUE SHIELD | Source: Ambulatory Visit | Attending: Internal Medicine | Admitting: Internal Medicine

## 2017-07-22 DIAGNOSIS — R928 Other abnormal and inconclusive findings on diagnostic imaging of breast: Secondary | ICD-10-CM

## 2017-07-22 DIAGNOSIS — N6489 Other specified disorders of breast: Secondary | ICD-10-CM | POA: Diagnosis not present

## 2017-07-24 ENCOUNTER — Ambulatory Visit (INDEPENDENT_AMBULATORY_CARE_PROVIDER_SITE_OTHER): Payer: BLUE CROSS/BLUE SHIELD | Admitting: Nurse Practitioner

## 2017-07-24 ENCOUNTER — Telehealth: Payer: Self-pay

## 2017-07-24 ENCOUNTER — Encounter: Payer: Self-pay | Admitting: Nurse Practitioner

## 2017-07-24 ENCOUNTER — Telehealth: Payer: Self-pay | Admitting: Hematology and Oncology

## 2017-07-24 VITALS — BP 110/74 | HR 68 | Ht 67.0 in | Wt 191.0 lb

## 2017-07-24 DIAGNOSIS — K602 Anal fissure, unspecified: Secondary | ICD-10-CM

## 2017-07-24 MED ORDER — DILTIAZEM GEL 2 %: 1.0000 "application " | Freq: Three times a day (TID) | CUTANEOUS | 1 refills | Status: DC

## 2017-07-24 NOTE — Telephone Encounter (Signed)
Order was placed but scheduler has not rescheduled

## 2017-07-24 NOTE — Patient Instructions (Addendum)
If you are age 56 or older, your body mass index should be between 23-30. Your Body mass index is 29.91 kg/m. If this is out of the aforementioned range listed, please consider follow up with your Primary Care Provider.  If you are age 20 or younger, your body mass index should be between 19-25. Your Body mass index is 29.91 kg/m. If this is out of the aformentioned range listed, please consider follow up with your Primary Care Provider.   We have sent the following medications to your pharmacy for you to pick up at your convenience: Diltiazem Gel - [place] (Lidocaine 5 %) - Over-the-counter - use 3 times daily as needed.  Warm tub soaks three times daily.  Follow up with Tye Savoy, NP on August 21, 2017 at 130 pm.  Thank you for choosing me and [facility].   Tye Savoy, NP

## 2017-07-24 NOTE — Telephone Encounter (Signed)
Pt called that on 10/10 pt chemo infusion was interrupted with cardiac aryrthmias and rigors and she was sent to ED. She has not been placed back on schedule.  10/15 Audrey Gross was supposed to have placed scheduling message for 10/23 with Dr Alvy Bimler and treatment. Not on schedule yet.

## 2017-07-24 NOTE — Progress Notes (Signed)
HPI:  Patient is a 56 yo female known to Dr. Ardis Hughs. She was evaluated in 2013 for iron deficiency anemia. She had a complete normal colonoscopy for this in July 2013.  Audrey Gross has CLL, getting Ibrutinib with Dr. Alvy Bimler. She is here for evaluation of rectal pain / bleeding with bowel movements which started a few weeks ago.  It even hurts to sit for prolong periods. Her stools have been soft, no straining. She is anxious about the bleeding and from where it is coming. Wants to make sure she doesn't have cancer. Her appetite is okay. No significant abdominal pain. No general medical complaints such as chest pain or SOB   Past Medical History:  Diagnosis Date  . Atrial fibrillation ([place])   . CLL (chronic lymphoblastic leukemia)    Leukemia  . Cystic breast   . Depression   . Diffuse cystic mastopathy   . Dyslipidemia (high LDL; low HDL)   . Hypertension   . Iron deficiency anemia, unspecified   . Lymphadenopathy of head and neck 04/05/2015  . Medical non-compliance   . Mitral regurgitation 02/2017   moderate to severe  . Persistent atrial fibrillation with rapid ventricular response ([place]) 07/15/2017     Past Surgical History:  Procedure Laterality Date  . CARDIOVERSION N/A 04/02/2017   Procedure: CARDIOVERSION;  Surgeon: Dorothy Spark, MD;  Location[facility] ENDOSCOPY;  Service: Cardiovascular;  Laterality: N/A;  . TUBAL LIGATION     Family History  Problem Relation Age of Onset  . Sudden death Mother   . Kidney disease Father        H/O HD and kidney transplant  . Stomach cancer Maternal Aunt   . Stomach cancer Paternal Grandmother   . Heart disease Brother   . Heart disease Maternal Grandmother    Social History  Substance Use Topics  . Smoking status: Never Smoker  . Smokeless tobacco: Never Used  . Alcohol use No   Current Outpatient Prescriptions  Medication Sig Dispense Refill  . acyclovir (ZOVIRAX) 400 MG tablet Take 1 tablet (400 mg total) by mouth  daily. 30 tablet 3  . allopurinol (ZYLOPRIM) 300 MG tablet Take 1 tablet (300 mg total) by mouth daily. 30 tablet 3  . apixaban (ELIQUIS) 5 MG TABS tablet Take 1 tablet (5 mg total) by mouth 2 (two) times daily. 60 tablet 11  . diltiazem (CARDIZEM CD) 240 MG 24 hr capsule Take 1 capsule (240 mg total) by mouth daily. 30 capsule 0  . furosemide (LASIX) 40 MG tablet Take 1 tablet (40 mg total) by mouth daily. 30 tablet 6  . medroxyPROGESTERone (PROVERA) 5 MG tablet TAKE 1 TABLET BY MOUTH DAILY FOR 5 DAYS EVERY OTHER MONTH IF NO SPONTANEOUS MENSES 15 tablet 1  . metoprolol tartrate (LOPRESSOR) 25 MG tablet Take 1 tablet (25 mg total) by mouth 2 (two) times daily. 60 tablet 0  . ondansetron (ZOFRAN) 8 MG tablet Take 1 tablet (8 mg total) by mouth every 8 (eight) hours as needed for refractory nausea / vomiting. 30 tablet 1  . predniSONE (DELTASONE) 10 MG tablet TAKE 1 TABLET BY MOUTH DAILY WITH BREAKFAST 30 tablet 0  . prochlorperazine (COMPAZINE) 10 MG tablet Take 1 tablet (10 mg total) by mouth every 6 (six) hours as needed (Nausea or vomiting). 30 tablet 1  . sacubitril-valsartan (ENTRESTO) 24-26 MG Take 1 tablet by mouth 2 (two) times daily. 60 tablet 0  . spironolactone (ALDACTONE) 25 MG tablet Take  0.5 tablets (12.5 mg total) by mouth daily. 30 tablet 3  . Vitamin D, Cholecalciferol, 1000 units CAPS Take 1,000 Units by mouth every morning.      No current facility-administered medications for this visit.    No Known Allergies   Review of Systems: All systems reviewed and negative except where noted in HPI.    Physical Exam: BP 110/74   Pulse 68   Ht 5\' 7"  (1.702 m)   Wt 191 lb (86.6 kg)   BMI 29.91 kg/m  Constitutional:  Well-developed, black female in no acute distress. Psychiatric: Normal mood and affect. Behavior is normal. EENT: Pupils normal.  Conjunctivae are normal. No scleral icterus. Neck supple.  Cardiovascular: Normal rate, regular rhythm. No edema Pulmonary/chest:  Effort normal and breath sounds normal. No wheezing, rales or rhonchi. Abdominal: Soft, nondistended. Nontender. Bowel sounds active throughout. There are no masses palpable. No hepatomegaly. Rectal exam: no external lesions. Anoscopy reveals a small tear just inside anal canal (anterior midline).  lymphadenopathy: No cervical adenopathy noted. Neurological: Alert and oriented to person place and time. Skin: Skin is warm and dry. No rashes noted.   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  -diltiazem gel TID x 6 weeks -sitz baths TID -avoid straining.  -follow up with me in 6 weeks.   Audrey Savoy, NP  07/24/2017, 1:43 PM

## 2017-07-28 ENCOUNTER — Other Ambulatory Visit (HOSPITAL_BASED_OUTPATIENT_CLINIC_OR_DEPARTMENT_OTHER): Payer: BLUE CROSS/BLUE SHIELD

## 2017-07-28 ENCOUNTER — Other Ambulatory Visit: Payer: Self-pay | Admitting: Hematology and Oncology

## 2017-07-28 ENCOUNTER — Ambulatory Visit (HOSPITAL_BASED_OUTPATIENT_CLINIC_OR_DEPARTMENT_OTHER): Payer: BLUE CROSS/BLUE SHIELD

## 2017-07-28 ENCOUNTER — Ambulatory Visit: Payer: Self-pay | Admitting: Hematology and Oncology

## 2017-07-28 ENCOUNTER — Encounter: Payer: Self-pay | Admitting: Hematology and Oncology

## 2017-07-28 ENCOUNTER — Other Ambulatory Visit: Payer: Self-pay

## 2017-07-28 ENCOUNTER — Telehealth: Payer: Self-pay | Admitting: Hematology and Oncology

## 2017-07-28 ENCOUNTER — Ambulatory Visit (HOSPITAL_BASED_OUTPATIENT_CLINIC_OR_DEPARTMENT_OTHER): Payer: BLUE CROSS/BLUE SHIELD | Admitting: Hematology and Oncology

## 2017-07-28 DIAGNOSIS — C9110 Chronic lymphocytic leukemia of B-cell type not having achieved remission: Secondary | ICD-10-CM

## 2017-07-28 DIAGNOSIS — Z5111 Encounter for antineoplastic chemotherapy: Secondary | ICD-10-CM | POA: Diagnosis not present

## 2017-07-28 DIAGNOSIS — I482 Chronic atrial fibrillation, unspecified: Secondary | ICD-10-CM

## 2017-07-28 DIAGNOSIS — R928 Other abnormal and inconclusive findings on diagnostic imaging of breast: Secondary | ICD-10-CM

## 2017-07-28 DIAGNOSIS — E790 Hyperuricemia without signs of inflammatory arthritis and tophaceous disease: Secondary | ICD-10-CM

## 2017-07-28 LAB — COMPREHENSIVE METABOLIC PANEL
ALBUMIN: 3.7 g/dL (ref 3.5–5.0)
ALK PHOS: 50 U/L (ref 40–150)
ALT: 24 U/L (ref 0–55)
AST: 21 U/L (ref 5–34)
Anion Gap: 10 mEq/L (ref 3–11)
BILIRUBIN TOTAL: 0.62 mg/dL (ref 0.20–1.20)
BUN: 14.6 mg/dL (ref 7.0–26.0)
CALCIUM: 9.2 mg/dL (ref 8.4–10.4)
CO2: 24 mEq/L (ref 22–29)
Chloride: 108 mEq/L (ref 98–109)
Creatinine: 0.8 mg/dL (ref 0.6–1.1)
GLUCOSE: 135 mg/dL (ref 70–140)
POTASSIUM: 4 meq/L (ref 3.5–5.1)
SODIUM: 142 meq/L (ref 136–145)
TOTAL PROTEIN: 6.3 g/dL — AB (ref 6.4–8.3)

## 2017-07-28 LAB — CBC WITH DIFFERENTIAL/PLATELET
BASO%: 1 % (ref 0.0–2.0)
BASOS ABS: 0.8 10*3/uL — AB (ref 0.0–0.1)
EOS ABS: 0.1 10*3/uL (ref 0.0–0.5)
EOS%: 0.1 % (ref 0.0–7.0)
HEMATOCRIT: 40.3 % (ref 34.8–46.6)
HEMOGLOBIN: 12.4 g/dL (ref 11.6–15.9)
LYMPH#: 75.6 10*3/uL — AB (ref 0.9–3.3)
LYMPH%: 92.4 % — ABNORMAL HIGH (ref 14.0–49.7)
MCH: 26.8 pg (ref 25.1–34.0)
MCHC: 30.8 g/dL — ABNORMAL LOW (ref 31.5–36.0)
MCV: 87.2 fL (ref 79.5–101.0)
MONO#: 1.4 10*3/uL — AB (ref 0.1–0.9)
MONO%: 1.8 % (ref 0.0–14.0)
NEUT#: 3.8 10*3/uL (ref 1.5–6.5)
NEUT%: 4.7 % — AB (ref 38.4–76.8)
NRBC: 0 % (ref 0–0)
Platelets: 293 10*3/uL (ref 145–400)
RBC: 4.62 10*6/uL (ref 3.70–5.45)
RDW: 17.4 % — AB (ref 11.2–14.5)
WBC: 81.8 10*3/uL (ref 3.9–10.3)

## 2017-07-28 LAB — TECHNOLOGIST REVIEW

## 2017-07-28 LAB — URIC ACID: URIC ACID, SERUM: 4.4 mg/dL (ref 2.6–7.4)

## 2017-07-28 MED ORDER — DEXAMETHASONE SODIUM PHOSPHATE 10 MG/ML IJ SOLN
10.0000 mg | Freq: Once | INTRAMUSCULAR | Status: AC
  Administered 2017-07-28: 10 mg via INTRAVENOUS

## 2017-07-28 MED ORDER — DEXAMETHASONE SODIUM PHOSPHATE 10 MG/ML IJ SOLN
INTRAMUSCULAR | Status: AC
  Filled 2017-07-28: qty 1

## 2017-07-28 MED ORDER — PALONOSETRON HCL INJECTION 0.25 MG/5ML
0.2500 mg | Freq: Once | INTRAVENOUS | Status: AC
  Administered 2017-07-28: 0.25 mg via INTRAVENOUS

## 2017-07-28 MED ORDER — SODIUM CHLORIDE 0.9 % IV SOLN
90.0000 mg/m2 | Freq: Once | INTRAVENOUS | Status: AC
  Administered 2017-07-28: 175 mg via INTRAVENOUS
  Filled 2017-07-28: qty 7

## 2017-07-28 MED ORDER — SODIUM CHLORIDE 0.9 % IV SOLN
Freq: Once | INTRAVENOUS | Status: AC
  Administered 2017-07-28: 15:00:00 via INTRAVENOUS

## 2017-07-28 MED ORDER — PALONOSETRON HCL INJECTION 0.25 MG/5ML
INTRAVENOUS | Status: AC
  Filled 2017-07-28: qty 5

## 2017-07-28 NOTE — Progress Notes (Signed)
07/29/2017 Audrey Gross 1961/06/01 [id number]   HPI:  Audrey Gross is a 56 y.o. female patient of Dr Sallyanne Kuster, with a PMH below who presents today for heart failure medication titration follow up.  Since her last visit here on October 8, she was admitted to the hospital with AF with RVR.  She was hospitalized for 4 days during which time her carvedilol was switched to metoprolol tartrate 25 mg bid.  She was also started on diltazem 240 mg once daily in the mornings.   She reports no problems with these medication changes, no SOB, CP LEE or dizziness.  Her heart rate has since dropped to below 100 for the most part, although some readings are still elevated.   She does have a hospital follow up appointment with Dr. Curt Bears on Nov 6.  Her medical history is significant for hypertension, persistent atrial fibrillation and LV systolic dysfunction.  Her EF is at 30-35%, by echo on Sept 14.  She is also being treated for CLL.   According to her chart she was previously on losartan 50 mg daily, which was then cut back to 25 mg shortly before her cardioversion on June 28.  At her last visit with me in early October, the losartan was discontinued and she was started on Entresto 24/26 mg bid.    Blood Pressure Goal:  130/80  Current Medications:  Entresto 24/26 mg bid  Metoprolol 25 mg   Dilitazem 240 mg qd - am  Spironolactone 12.5 mg qd am   Furosemide 40 mg qd am  Family Hx:  Mother died at 39 from rhuematic fever  Father had hypertension, along with paternal aunts  Oldest (1/2) brother with heart transplant, other (1/2) brother with hypertension  Has 6 kids, oldest in 20's but no hypertension yet  Social Hx:  No alcohol. Tobacco, tea/soda up to 5 times per week  Diet:  Eating out more than home recently, no added salts; not excluding fast foods; does like salad; eating oatmeal for breakfast most days; ([employer] regularly)  Exercise:  Not currently - low energy, SOB  Home BP  readings:  Reli-On home BP cuff, unknown age - home cuff read within 10 points of office reading;  Of 9 readings since hospital d/c on Oct 14, her BP range is 110-144/72-82. The average of those 9 readings is 124/77.  HR range was 68-106 with average of 93.  Patient reports that her HR still goes > 100 most days of the week, but usually in the late afternoons or evenings    Intolerances:   NKDA  Estimated Creatinine Clearance: 89.5 mL/min (by C-G formula based on SCr of 0.8 mg/dL).  Wt Readings from Last 3 Encounters:  07/28/17 194 lb 6.4 oz (88.2 kg)  07/24/17 191 lb (86.6 kg)  07/19/17 184 lb 15.5 oz (83.9 kg)   BP Readings from Last 3 Encounters:  07/29/17 116/60  07/28/17 118/79  07/24/17 110/74   Pulse Readings from Last 3 Encounters:  07/29/17 92  07/28/17 100  07/24/17 68    Current Outpatient Prescriptions  Medication Sig Dispense Refill  . acyclovir (ZOVIRAX) 400 MG tablet Take 1 tablet (400 mg total) by mouth daily. 30 tablet 3  . allopurinol (ZYLOPRIM) 300 MG tablet Take 1 tablet (300 mg total) by mouth daily. 30 tablet 3  . apixaban (ELIQUIS) 5 MG TABS tablet Take 1 tablet (5 mg total) by mouth 2 (two) times daily. 60 tablet 11  . diltiazem (  CARDIZEM CD) 240 MG 24 hr capsule Take 1 capsule (240 mg total) by mouth daily. 30 capsule 0  . diltiazem 2 % GEL Apply 1 application topically 3 (three) times daily. Use for 6 weeks 30 g 1  . furosemide (LASIX) 40 MG tablet Take 1 tablet (40 mg total) by mouth daily. 30 tablet 6  . medroxyPROGESTERone (PROVERA) 5 MG tablet TAKE 1 TABLET BY MOUTH DAILY FOR 5 DAYS EVERY OTHER MONTH IF NO SPONTANEOUS MENSES 15 tablet 1  . metoprolol tartrate (LOPRESSOR) 25 MG tablet Take 1.5 tablets (37.5 mg total) by mouth 2 (two) times daily. 90 tablet 3  . ondansetron (ZOFRAN) 8 MG tablet Take 1 tablet (8 mg total) by mouth every 8 (eight) hours as needed for refractory nausea / vomiting. 30 tablet 1  . predniSONE (DELTASONE) 10 MG tablet TAKE 1  TABLET BY MOUTH DAILY WITH BREAKFAST 30 tablet 0  . prochlorperazine (COMPAZINE) 10 MG tablet Take 1 tablet (10 mg total) by mouth every 6 (six) hours as needed (Nausea or vomiting). 30 tablet 1  . sacubitril-valsartan (ENTRESTO) 24-26 MG Take 1 tablet by mouth 2 (two) times daily. 60 tablet 0  . spironolactone (ALDACTONE) 25 MG tablet Take 0.5 tablets (12.5 mg total) by mouth daily. 30 tablet 3  . Vitamin D, Cholecalciferol, 1000 units CAPS Take 1,000 Units by mouth every morning.      No current facility-administered medications for this visit.     No Known Allergies  Past Medical History:  Diagnosis Date  . Atrial fibrillation ([place])   . CLL (chronic lymphoblastic leukemia)    Leukemia  . Cystic breast   . Depression   . Diffuse cystic mastopathy   . Dyslipidemia (high LDL; low HDL)   . Hypertension   . Iron deficiency anemia, unspecified   . Lymphadenopathy of head and neck 04/05/2015  . Medical non-compliance   . Mitral regurgitation 02/2017   moderate to severe  . Persistent atrial fibrillation with rapid ventricular response ([place]) 07/15/2017    Blood pressure 116/60, pulse 92.  Chronic systolic heart failure (HCC) Patient with CHF, AF and HTN with continued elevated heart rate, although better since starting diltiazem.  BP good in the office today at 116/60, I am not sure that her BP could tolerate an increase in the Entresto to 49/51.  Because her HR is still high, will instead increase the metoprolol from 25 mg bid to 37.5 mg bid.  Patient will follow up with Dr. Baird Kay in 2 weeks.  If any further titration of medications is needed, we can see her again as well.  Patient is to continue with home BP checks daily and was encouraged to note her HR on the worksheet several times per day.  She will share this with Dr. Baird Kay when she sees him.     Tommy Medal PharmD CPP [facility] [address] [place],   [address] [phone]

## 2017-07-28 NOTE — Progress Notes (Signed)
I agree with the above note, plan 

## 2017-07-28 NOTE — Progress Notes (Signed)
[place] OFFICE PROGRESS NOTE  Patient Care Team: Janith Lima, MD as PCP - General (Internal Medicine)  SUMMARY OF ONCOLOGIC HISTORY:   CLL (chronic lymphocytic leukemia) ([place])   04/05/2015 Pathology Results    Accession: [id number] flow cytometry confirmed CLL. FISH was positive for p53 mutation      04/24/2015 Imaging    Extensive lymphadenopathy throughout the neck, chest (axilla), abdomen and pelvis, as detailed above, compatible with the reported clinical history of lymphoma. 2. Mild splenomegaly.      05/03/2015 - 08/27/2015 Chemotherapy    She started on Ibrutinib, discontinued prematurely when her prescription ran out      10/10/2015 - 05/29/2017 Chemotherapy    She was restarted back on Ibrutinib      11/13/2016 PET scan    Significant generalized reduction in size of numerous lymph nodes in the neck, chest, abdomen, and pelvis. Previously the activity of these nodes was low-level and in general a similar low-level activity is present today, significantly less than mediastinal blood pool activity, compatible with [place] 2. 2. Coronary atherosclerosis. Mild cardiomegaly. 3. Mildly prominent endometrium for age without accentuated metabolic activity in the endometrium. Consider pelvic sonography for further characterization.      05/27/2017 PET scan    1. Progressive hypermetabolic adenopathy, primarily involving cervical, axillary, pelvic and inguinal lymph nodes bilaterally, consistent with progressive lymphoma. 2. No solid visceral organ or osseous involvement.      06/16/2017 -  Chemotherapy    The patient had palonosetron (ALOXI) injection 0.25 mg, 0.25 mg, Intravenous,  Once, 1 of 4 cycles  riTUXimab (RITUXAN) 200 mg in sodium chloride 0.9 % 250 mL (0.7407 mg/mL) chemo infusion, 100 mg/m2 = 200 mg (100 % of original dose 100 mg/m2), Intravenous,  Once, 1 of 4 cycles Dose modification: 100 mg/m2 (original dose 100 mg/m2, Cycle 1, Reason: Provider  Judgment, Comment: anticipate high risk infusion)  bendamustine (BENDEKA) 175 mg in sodium chloride 0.9 % 50 mL (3.0702 mg/mL) chemo infusion, 90 mg/m2 = 175 mg, Intravenous,  Once, 1 of 4 cycles  for chemotherapy treatment.  She received Rituximab and Bendamustine      07/15/2017 - 07/19/2017 Hospital Admission    The patient was briefly admitted to the hospital due to infusion reaction to rituximab       INTERVAL HISTORY: Please see below for problem oriented charting. She returns with her husband for further follow-up and to resume chemotherapy She noted diffuse lymphadenopathy since recent visit She denies recent infection She was not able to pursue further workup for abnormal mammogram Her appetite is stable, no recent weight loss No chest pain, shortness of breath or diffuse swelling  REVIEW OF SYSTEMS:   Constitutional: Denies fevers, chills or abnormal weight loss Eyes: Denies blurriness of vision Ears, nose, mouth, throat, and face: Denies mucositis or sore throat Respiratory: Denies cough, dyspnea or wheezes Cardiovascular: Denies palpitation, chest discomfort or lower extremity swelling Gastrointestinal:  Denies nausea, heartburn or change in bowel habits Skin: Denies abnormal skin rashes Neurological:Denies numbness, tingling or new weaknesses Behavioral/Psych: Mood is stable, no new changes  All other systems were reviewed with the patient and are negative.  I have reviewed the past medical history, past surgical history, social history and family history with the patient and they are unchanged from previous note.  ALLERGIES:  has No Known Allergies.  MEDICATIONS:  Current Outpatient Prescriptions  Medication Sig Dispense Refill  . acyclovir (ZOVIRAX) 400 MG tablet Take 1 tablet (400 mg  total) by mouth daily. 30 tablet 3  . allopurinol (ZYLOPRIM) 300 MG tablet Take 1 tablet (300 mg total) by mouth daily. 30 tablet 3  . apixaban (ELIQUIS) 5 MG TABS tablet Take 1  tablet (5 mg total) by mouth 2 (two) times daily. 60 tablet 11  . diltiazem (CARDIZEM CD) 240 MG 24 hr capsule Take 1 capsule (240 mg total) by mouth daily. 30 capsule 0  . diltiazem 2 % GEL Apply 1 application topically 3 (three) times daily. Use for 6 weeks 30 g 1  . furosemide (LASIX) 40 MG tablet Take 1 tablet (40 mg total) by mouth daily. 30 tablet 6  . medroxyPROGESTERone (PROVERA) 5 MG tablet TAKE 1 TABLET BY MOUTH DAILY FOR 5 DAYS EVERY OTHER MONTH IF NO SPONTANEOUS MENSES 15 tablet 1  . metoprolol tartrate (LOPRESSOR) 25 MG tablet Take 1 tablet (25 mg total) by mouth 2 (two) times daily. 60 tablet 0  . ondansetron (ZOFRAN) 8 MG tablet Take 1 tablet (8 mg total) by mouth every 8 (eight) hours as needed for refractory nausea / vomiting. 30 tablet 1  . predniSONE (DELTASONE) 10 MG tablet TAKE 1 TABLET BY MOUTH DAILY WITH BREAKFAST 30 tablet 0  . prochlorperazine (COMPAZINE) 10 MG tablet Take 1 tablet (10 mg total) by mouth every 6 (six) hours as needed (Nausea or vomiting). 30 tablet 1  . sacubitril-valsartan (ENTRESTO) 24-26 MG Take 1 tablet by mouth 2 (two) times daily. 60 tablet 0  . spironolactone (ALDACTONE) 25 MG tablet Take 0.5 tablets (12.5 mg total) by mouth daily. 30 tablet 3  . Vitamin D, Cholecalciferol, 1000 units CAPS Take 1,000 Units by mouth every morning.      No current facility-administered medications for this visit.     PHYSICAL EXAMINATION: ECOG PERFORMANCE STATUS: 1 - Symptomatic but completely ambulatory  Vitals:   07/28/17 1420  BP: 118/79  Pulse: 100  Resp: 20  Temp: 98.6 F (37 C)  SpO2: 100%   Filed Weights   07/28/17 1420  Weight: 194 lb 6.4 oz (88.2 kg)    GENERAL:alert, no distress and comfortable SKIN: skin color, texture, turgor are normal, no rashes or significant lesions EYES: normal, Conjunctiva are pink and non-injected, sclera clear OROPHARYNX:no exudate, no erythema and lips, buccal mucosa, and tongue normal  NECK: supple, thyroid  normal size, non-tender, without nodularity LYMPH: She has diffuse lymphadenopathy in her neck LUNGS: clear to auscultation and percussion with normal breathing effort HEART: regular rate & rhythm and no murmurs and no lower extremity edema ABDOMEN:abdomen soft, non-tender and normal bowel sounds Musculoskeletal:no cyanosis of digits and no clubbing  NEURO: alert & oriented x 3 with fluent speech, no focal motor/sensory deficits  LABORATORY DATA:  I have reviewed the data as listed    Component Value Date/Time   NA 142 07/28/2017 1344   K 4.0 07/28/2017 1344   CL 100 (L) 07/19/2017 0739   CL 105 01/18/2013 0912   CO2 24 07/28/2017 1344   GLUCOSE 135 07/28/2017 1344   GLUCOSE 83 01/18/2013 0912   BUN 14.6 07/28/2017 1344   CREATININE 0.8 07/28/2017 1344   CALCIUM 9.2 07/28/2017 1344   PROT 6.3 (L) 07/28/2017 1344   ALBUMIN 3.7 07/28/2017 1344   AST 21 07/28/2017 1344   ALT 24 07/28/2017 1344   ALKPHOS 50 07/28/2017 1344   BILITOT 0.62 07/28/2017 1344   GFRNONAA >60 07/19/2017 0739   GFRAA >60 07/19/2017 0739    No results found for: SPEP, UPEP  Lab Results  Component Value Date   WBC 81.8 (HH) 07/28/2017   NEUTROABS 3.8 07/28/2017   HGB 12.4 07/28/2017   HCT 40.3 07/28/2017   MCV 87.2 07/28/2017   PLT 293 07/28/2017      Chemistry      Component Value Date/Time   NA 142 07/28/2017 1344   K 4.0 07/28/2017 1344   CL 100 (L) 07/19/2017 0739   CL 105 01/18/2013 0912   CO2 24 07/28/2017 1344   BUN 14.6 07/28/2017 1344   CREATININE 0.8 07/28/2017 1344      Component Value Date/Time   CALCIUM 9.2 07/28/2017 1344   ALKPHOS 50 07/28/2017 1344   AST 21 07/28/2017 1344   ALT 24 07/28/2017 1344   BILITOT 0.62 07/28/2017 1344       RADIOGRAPHIC STUDIES: I have personally reviewed the radiological images as listed and agreed with the findings in the report. Dg Chest Portable 1 View  Result Date: 07/15/2017 CLINICAL DATA:  Was at [facility] receiving  chemotherapy, back began to hurt, shaking, tachycardia, history atrial fibrillation, CLL, hypertension EXAM: PORTABLE CHEST 1 VIEW COMPARISON:  Portable exam 1728 hours compared to 02/26/2017 FINDINGS: Enlargement of cardiac silhouette with pulmonary vascular congestion. Mediastinal contours normal. Lungs clear. No pulmonary infiltrate, pleural effusion or pneumothorax. Bones unremarkable. IMPRESSION: Enlargement of cardiac silhouette with pulmonary vascular congestion. No acute infiltrate. Electronically Signed   By: Lavonia Dana M.D.   On: 07/15/2017 17:57   Mm Screening Breast Tomo Bilateral  Result Date: 07/02/2017 CLINICAL DATA:  Screening. EXAM: 2D DIGITAL SCREENING BILATERAL MAMMOGRAM WITH CAD AND ADJUNCT TOMO COMPARISON:  Previous exam(s). ACR Breast Density Category c: The breast tissue is heterogeneously dense, which may obscure small masses. FINDINGS: In the right breast   requires further evaluation. In the left breast   requires further evaluation. Images were processed with CAD. IMPRESSION: Further evaluation is suggested for possible enlarged axillary lymph nodes in the right breast. Further evaluation is suggested for possible enlarged axillary lymph nodes in the left breast. RECOMMENDATION: Ultrasound of both breasts. (Code:[id number]) The patient will be contacted regarding the findings, and additional imaging will be scheduled. BI-RADS CATEGORY  0: Incomplete. Need additional imaging evaluation and/or prior mammograms for comparison. Electronically Signed   By: Abelardo Diesel M.D.   On: 07/02/2017 12:22   [place] Axilla Left  Result Date: 07/22/2017 CLINICAL DATA:  Bilateral enlarged axillary lymph nodes seen on most recent screening mammography. EXAM: ULTRASOUND OF THE BILATERAL AXILLA COMPARISON:  Prior mammograms. FINDINGS: On physical exam,there is fullness in bilateral axillae. Patient describes no tenderness on exam. Ultrasound of the bilateral axillae is performed, showing bilateral  symmetric abnormally enlarged lymph nodes with thickened cortices. A cortical thickness of up to 1.1 cm is measured in the right axilla. IMPRESSION: Bilateral marked symmetric axillary lymphadenopathy. Given the symmetric and numerous bilateral axillary lymph nodes, these are likely secondary to patient's known CLL. RECOMMENDATION: Patient is being followed by Dr. Alvy Bimler at [facility]. If core needle biopsy of one of the abnormal lymph nodes is clinically desired, one will be scheduled. These results were called by telephone at the time of interpretation on 07/22/2017 at 10:28 am to Dr. [name] and Dr. Alvy Bimler, who verbally acknowledged these results. I have discussed the findings and recommendations with the patient. Results were also provided in writing at the conclusion of the visit. If applicable, a reminder letter will be sent to the patient regarding the next appointment. BI-RADS CATEGORY  4: Suspicious.  Electronically Signed   By: Fidela Salisbury M.D.   On: 07/22/2017 10:29   [place] Axilla Right  Result Date: 07/22/2017 CLINICAL DATA:  Bilateral enlarged axillary lymph nodes seen on most recent screening mammography. EXAM: ULTRASOUND OF THE BILATERAL AXILLA COMPARISON:  Prior mammograms. FINDINGS: On physical exam,there is fullness in bilateral axillae. Patient describes no tenderness on exam. Ultrasound of the bilateral axillae is performed, showing bilateral symmetric abnormally enlarged lymph nodes with thickened cortices. A cortical thickness of up to 1.1 cm is measured in the right axilla. IMPRESSION: Bilateral marked symmetric axillary lymphadenopathy. Given the symmetric and numerous bilateral axillary lymph nodes, these are likely secondary to patient's known CLL. RECOMMENDATION: Patient is being followed by Dr. Alvy Bimler at [facility]. If core needle biopsy of one of the abnormal lymph nodes is clinically desired, one will be scheduled. These results were called by  telephone at the time of interpretation on 07/22/2017 at 10:28 am to Dr. [name] and Dr. Alvy Bimler, who verbally acknowledged these results. I have discussed the findings and recommendations with the patient. Results were also provided in writing at the conclusion of the visit. If applicable, a reminder letter will be sent to the patient regarding the next appointment. BI-RADS CATEGORY  4: Suspicious. Electronically Signed   By: Fidela Salisbury M.D.   On: 07/22/2017 10:29    ASSESSMENT & PLAN:  CLL (chronic lymphocytic leukemia) ([place]) The patient has significant reaction to rituximab She had excellent response to treatment however she did not tolerate it For the next few cycles, I will omit rituximab and continue on bendamustine only If I can get her white count down to under 20,000, I will resume rituximab I discussed the rationale of avoiding excessive risk of infusion reaction with the patient and she agreed I recommend she continue low-dose 10 mg prednisone for now  Abnormal mammogram of right breast I have discussed the mammogram with the patient I suspect the abnormal lymphadenopathy seen is related to her lymphoma and I would not be too concerned about pursuing further workup to rule out secondary breast cancer for now  Chronic atrial fibrillation ([place]) She has chronic atrial fibrillation with recent cardiomyopathy She is currently rate control She will continue aggressive medication management   No orders of the defined types were placed in this encounter.  All questions were answered. The patient knows to call the clinic with any problems, questions or concerns. No barriers to learning was detected. I spent 20 minutes counseling the patient face to face. The total time spent in the appointment was 30 minutes and more than 50% was on counseling and review of test results     Heath Lark, MD 07/28/2017 4:40 PM

## 2017-07-28 NOTE — Assessment & Plan Note (Signed)
The patient has significant reaction to rituximab She had excellent response to treatment however she did not tolerate it For the next few cycles, I will omit rituximab and continue on bendamustine only If I can get her white count down to under 20,000, I will resume rituximab I discussed the rationale of avoiding excessive risk of infusion reaction with the patient and she agreed I recommend she continue low-dose 10 mg prednisone for now

## 2017-07-28 NOTE — Telephone Encounter (Signed)
Scheduled appt per 10/23 los - Gave patient AVS and calender per los.  

## 2017-07-28 NOTE — Assessment & Plan Note (Signed)
She has chronic atrial fibrillation with recent cardiomyopathy She is currently rate control She will continue aggressive medication management

## 2017-07-28 NOTE — Patient Instructions (Signed)
[facility] Discharge Instructions for Patients Receiving Chemotherapy  Today you received the following chemotherapy agents [place].   To help prevent nausea and vomiting after your treatment, we encourage you to take your nausea medication as prescribed.    If you develop nausea and vomiting that is not controlled by your nausea medication, call the clinic.   BELOW ARE SYMPTOMS THAT SHOULD BE REPORTED IMMEDIATELY:  *FEVER GREATER THAN 100.5 F  *CHILLS WITH OR WITHOUT FEVER  NAUSEA AND VOMITING THAT IS NOT CONTROLLED WITH YOUR NAUSEA MEDICATION  *UNUSUAL SHORTNESS OF BREATH  *UNUSUAL BRUISING OR BLEEDING  TENDERNESS IN MOUTH AND THROAT WITH OR WITHOUT PRESENCE OF ULCERS  *URINARY PROBLEMS  *BOWEL PROBLEMS  UNUSUAL RASH Items with * indicate a potential emergency and should be followed up as soon as possible.  Feel free to call the clinic should you have any questions or concerns. The clinic phone number is [phone].  Please show the CHEMO ALERT CARD at check-in to the Emergency Department and triage nurse.  

## 2017-07-28 NOTE — Assessment & Plan Note (Signed)
I have discussed the mammogram with the patient I suspect the abnormal lymphadenopathy seen is related to her lymphoma and I would not be too concerned about pursuing further workup to rule out secondary breast cancer for now

## 2017-07-29 ENCOUNTER — Ambulatory Visit (INDEPENDENT_AMBULATORY_CARE_PROVIDER_SITE_OTHER): Payer: BLUE CROSS/BLUE SHIELD | Admitting: Pharmacist Clinician (PhC)/ Clinical Pharmacy Specialist

## 2017-07-29 ENCOUNTER — Encounter: Payer: Self-pay | Admitting: Pharmacist Clinician (PhC)/ Clinical Pharmacy Specialist

## 2017-07-29 DIAGNOSIS — I5022 Chronic systolic (congestive) heart failure: Secondary | ICD-10-CM | POA: Diagnosis not present

## 2017-07-29 MED ORDER — METOPROLOL TARTRATE 25 MG PO TABS: 37.5000 mg | ORAL_TABLET | Freq: Two times a day (BID) | ORAL | 3 refills | Status: DC

## 2017-07-29 NOTE — Assessment & Plan Note (Signed)
Patient with CHF, AF and HTN with continued elevated heart rate, although better since starting diltiazem.  BP good in the office today at 116/60, I am not sure that her BP could tolerate an increase in the Entresto to 49/51.  Because her HR is still high, will instead increase the metoprolol from 25 mg bid to 37.5 mg bid.  Patient will follow up with Dr. Baird Kay in 2 weeks.  If any further titration of medications is needed, we can see her again as well.  Patient is to continue with home BP checks daily and was encouraged to note her HR on the worksheet several times per day.  She will share this with Dr. Baird Kay when she sees him.

## 2017-07-29 NOTE — Patient Instructions (Signed)
Return for a a follow up appointment with Dr. Curt Bears on November 6  Your blood pressure today is 116/60  (HR 92)    Check your blood pressure at home daily and keep record of the readings.  Take your BP meds as follows:  Increase metoprolol to 1.5 tablets (37.5 mg) twice daily  Continue with all other medications  Bring all of your meds, your BP cuff and your record of home blood pressures to your next appointment.  Exercise as you're able, try to walk approximately 30 minutes per day.  Keep salt intake to a minimum, especially watch canned and prepared boxed foods.  Eat more fresh fruits and vegetables and fewer canned items.  Avoid eating in fast food restaurants.    HOW TO TAKE YOUR BLOOD PRESSURE: . Rest 5 minutes before taking your blood pressure. .  Don't smoke or drink caffeinated beverages for at least 30 minutes before. . Take your blood pressure before (not after) you eat. . Sit comfortably with your back supported and both feet on the floor (don't cross your legs). . Elevate your arm to heart level on a table or a desk. . Use the proper sized cuff. It should fit smoothly and snugly around your bare upper arm. There should be enough room to slip a fingertip under the cuff. The bottom edge of the cuff should be 1 inch above the crease of the elbow. . Ideally, take 3 measurements at one sitting and record the average.

## 2017-07-30 ENCOUNTER — Other Ambulatory Visit: Payer: Self-pay | Admitting: Hematology and Oncology

## 2017-08-03 ENCOUNTER — Encounter: Payer: Self-pay | Admitting: Internal Medicine

## 2017-08-03 ENCOUNTER — Ambulatory Visit (INDEPENDENT_AMBULATORY_CARE_PROVIDER_SITE_OTHER): Payer: BLUE CROSS/BLUE SHIELD | Admitting: Internal Medicine

## 2017-08-03 VITALS — BP 132/68 | HR 99 | Temp 99.0°F | Resp 16 | Ht 67.0 in | Wt 190.0 lb

## 2017-08-03 DIAGNOSIS — I480 Paroxysmal atrial fibrillation: Secondary | ICD-10-CM | POA: Diagnosis not present

## 2017-08-03 DIAGNOSIS — I10 Essential (primary) hypertension: Secondary | ICD-10-CM

## 2017-08-03 NOTE — Progress Notes (Signed)
Subjective:  Patient ID: Audrey Gross, female    DOB: 27-Feb-1961  Age: 56 y.o. MRN: [id number]  CC: Hypertension   HPI Audrey Gross presents for f/up -she was recently admitted with A. fib with rapid ventricular response with a heart rate up to 200.  She was also diagnosed with CHF.  Since discharge she is feeling much better.  Her blood pressure and heart rate have been well controlled.  She has had no episodes of palpitations, chest pain, shortness of breath, edema, or fatigue.  Outpatient Medications Prior to Visit  Medication Sig Dispense Refill  . acyclovir (ZOVIRAX) 400 MG tablet Take 1 tablet (400 mg total) by mouth daily. 30 tablet 3  . allopurinol (ZYLOPRIM) 300 MG tablet Take 1 tablet (300 mg total) by mouth daily. 30 tablet 3  . apixaban (ELIQUIS) 5 MG TABS tablet Take 1 tablet (5 mg total) by mouth 2 (two) times daily. 60 tablet 11  . diltiazem (CARDIZEM CD) 240 MG 24 hr capsule Take 1 capsule (240 mg total) by mouth daily. 30 capsule 0  . diltiazem 2 % GEL Apply 1 application topically 3 (three) times daily. Use for 6 weeks 30 g 1  . furosemide (LASIX) 40 MG tablet Take 1 tablet (40 mg total) by mouth daily. 30 tablet 6  . medroxyPROGESTERone (PROVERA) 5 MG tablet TAKE 1 TABLET BY MOUTH DAILY FOR 5 DAYS EVERY OTHER MONTH IF NO SPONTANEOUS MENSES 15 tablet 1  . metoprolol tartrate (LOPRESSOR) 25 MG tablet Take 1.5 tablets (37.5 mg total) by mouth 2 (two) times daily. 90 tablet 3  . ondansetron (ZOFRAN) 8 MG tablet Take 1 tablet (8 mg total) by mouth every 8 (eight) hours as needed for refractory nausea / vomiting. 30 tablet 1  . predniSONE (DELTASONE) 10 MG tablet TAKE 1 TABLET BY MOUTH DAILY WITH BREAKFAST 30 tablet 0  . prochlorperazine (COMPAZINE) 10 MG tablet Take 1 tablet (10 mg total) by mouth every 6 (six) hours as needed (Nausea or vomiting). 30 tablet 1  . sacubitril-valsartan (ENTRESTO) 24-26 MG Take 1 tablet by mouth 2 (two) times daily. 60 tablet 0  . spironolactone  (ALDACTONE) 25 MG tablet Take 0.5 tablets (12.5 mg total) by mouth daily. 30 tablet 3  . Vitamin D, Cholecalciferol, 1000 units CAPS Take 1,000 Units by mouth every morning.      No facility-administered medications prior to visit.     ROS Review of Systems  Constitutional: Negative.  Negative for diaphoresis, fatigue and unexpected weight change.  HENT: Negative.  Negative for trouble swallowing.   Eyes: Negative.  Negative for visual disturbance.  Respiratory: Negative.  Negative for cough, chest tightness, shortness of breath and wheezing.   Cardiovascular: Negative for chest pain, palpitations and leg swelling.  Gastrointestinal: Negative for abdominal pain, constipation, diarrhea and vomiting.  Endocrine: Negative.   Genitourinary: Negative.  Negative for difficulty urinating.  Musculoskeletal: Negative.   Skin: Negative.   Neurological: Negative.  Negative for dizziness, weakness, light-headedness and numbness.  Hematological: Negative for adenopathy. Does not bruise/bleed easily.  Psychiatric/Behavioral: Negative.     Objective:  BP 132/68 (BP Location: Left Arm, Patient Position: Sitting, Cuff Size: Normal)   Pulse 99   Temp 99 F (37.2 C)   Resp 16   Ht 5\' 7"  (1.702 m)   Wt 190 lb (86.2 kg)   SpO2 99%   BMI 29.76 kg/m   BP Readings from Last 3 Encounters:  08/03/17 132/68  07/29/17 116/60  07/28/17  118/79    Wt Readings from Last 3 Encounters:  08/03/17 190 lb (86.2 kg)  07/28/17 194 lb 6.4 oz (88.2 kg)  07/24/17 191 lb (86.6 kg)    Physical Exam  Constitutional: She is oriented to person, place, and time. No distress.  HENT:  Mouth/Throat: Oropharynx is clear and moist. No oropharyngeal exudate.  Eyes: Conjunctivae are normal. Right eye exhibits no discharge. Left eye exhibits no discharge. No scleral icterus.  Neck: Normal range of motion. Neck supple. No JVD present. No thyromegaly present.  Cardiovascular: Normal rate, regular rhythm and intact distal  pulses.  Exam reveals no gallop and no friction rub.   No murmur heard. Pulmonary/Chest: Effort normal and breath sounds normal. No respiratory distress. She has no wheezes. She has no rales. She exhibits no tenderness.  Abdominal: Soft. Bowel sounds are normal. She exhibits no distension and no mass. There is no tenderness. There is no rebound and no guarding.  Musculoskeletal: Normal range of motion. She exhibits no edema, tenderness or deformity.  Lymphadenopathy:    She has no cervical adenopathy.  Neurological: She is alert and oriented to person, place, and time.  Skin: Skin is warm and dry. No rash noted. She is not diaphoretic. No erythema. No pallor.  Vitals reviewed.   Lab Results  Component Value Date   WBC 81.8 (HH) 07/28/2017   HGB 12.4 07/28/2017   HCT 40.3 07/28/2017   PLT 293 07/28/2017   GLUCOSE 135 07/28/2017   CHOL 170 07/16/2017   TRIG 100 07/16/2017   HDL 31 (L) 07/16/2017   LDLDIRECT 144.5 07/26/2013   LDLCALC 119 (H) 07/16/2017   ALT 24 07/28/2017   AST 21 07/28/2017   NA 142 07/28/2017   K 4.0 07/28/2017   CL 100 (L) 07/19/2017   CREATININE 0.8 07/28/2017   BUN 14.6 07/28/2017   CO2 24 07/28/2017   TSH 0.069 (L) 07/15/2017   INR 1.40 07/16/2017    [place] Axilla Left  Result Date: 07/22/2017 CLINICAL DATA:  Bilateral enlarged axillary lymph nodes seen on most recent screening mammography. EXAM: ULTRASOUND OF THE BILATERAL AXILLA COMPARISON:  Prior mammograms. FINDINGS: On physical exam,there is fullness in bilateral axillae. Patient describes no tenderness on exam. Ultrasound of the bilateral axillae is performed, showing bilateral symmetric abnormally enlarged lymph nodes with thickened cortices. A cortical thickness of up to 1.1 cm is measured in the right axilla. IMPRESSION: Bilateral marked symmetric axillary lymphadenopathy. Given the symmetric and numerous bilateral axillary lymph nodes, these are likely secondary to patient's known CLL. RECOMMENDATION:  Patient is being followed by Dr. Alvy Bimler at [facility]. If core needle biopsy of one of the abnormal lymph nodes is clinically desired, one will be scheduled. These results were called by telephone at the time of interpretation on 07/22/2017 at 10:28 am to Dr. [name] and Dr. Alvy Bimler, who verbally acknowledged these results. I have discussed the findings and recommendations with the patient. Results were also provided in writing at the conclusion of the visit. If applicable, a reminder letter will be sent to the patient regarding the next appointment. BI-RADS CATEGORY  4: Suspicious. Electronically Signed   By: Fidela Salisbury M.D.   On: 07/22/2017 10:29   [place] Axilla Right  Result Date: 07/22/2017 CLINICAL DATA:  Bilateral enlarged axillary lymph nodes seen on most recent screening mammography. EXAM: ULTRASOUND OF THE BILATERAL AXILLA COMPARISON:  Prior mammograms. FINDINGS: On physical exam,there is fullness in bilateral axillae. Patient describes no tenderness on exam. Ultrasound of  the bilateral axillae is performed, showing bilateral symmetric abnormally enlarged lymph nodes with thickened cortices. A cortical thickness of up to 1.1 cm is measured in the right axilla. IMPRESSION: Bilateral marked symmetric axillary lymphadenopathy. Given the symmetric and numerous bilateral axillary lymph nodes, these are likely secondary to patient's known CLL. RECOMMENDATION: Patient is being followed by Dr. Alvy Bimler at [facility]. If core needle biopsy of one of the abnormal lymph nodes is clinically desired, one will be scheduled. These results were called by telephone at the time of interpretation on 07/22/2017 at 10:28 am to Dr. [name] and Dr. Alvy Bimler, who verbally acknowledged these results. I have discussed the findings and recommendations with the patient. Results were also provided in writing at the conclusion of the visit. If applicable, a reminder letter will be sent to the  patient regarding the next appointment. BI-RADS CATEGORY  4: Suspicious. Electronically Signed   By: Fidela Salisbury M.D.   On: 07/22/2017 10:29    Assessment & Plan:   Arrianna was seen today for hypertension.  Diagnoses and all orders for this visit:  Essential hypertension- her blood pressure is well controlled.  Recent electrolytes reveal a slightly low chloride but normal sodium.  Will continue the current pharmacological regimen.  PAF (paroxysmal atrial fibrillation) ([place])- she is maintaining normal rate and rhythm.  Will continue anticoagulation with Eliquis.   I am having Ms. Wamboldt maintain her apixaban, medroxyPROGESTERone, Vitamin D (Cholecalciferol), furosemide, spironolactone, ondansetron, prochlorperazine, acyclovir, allopurinol, diltiazem, sacubitril-valsartan, diltiazem, metoprolol tartrate, and predniSONE.  No orders of the defined types were placed in this encounter.    Follow-up: No Follow-up on file.  [name], MD

## 2017-08-04 ENCOUNTER — Other Ambulatory Visit: Payer: Self-pay | Admitting: Cardiovascular Disease

## 2017-08-04 NOTE — Patient Instructions (Signed)

## 2017-08-04 NOTE — Telephone Encounter (Signed)
Pt calling requesting a refill on Entresto 24-26 mg tablet. Please address

## 2017-08-04 NOTE — Telephone Encounter (Signed)
Erroneous encounter

## 2017-08-05 ENCOUNTER — Other Ambulatory Visit: Payer: Self-pay

## 2017-08-05 ENCOUNTER — Telehealth: Payer: Self-pay | Admitting: Cardiovascular Disease

## 2017-08-05 MED ORDER — SACUBITRIL-VALSARTAN 24-26 MG PO TABS: 1.0000 | ORAL_TABLET | Freq: Two times a day (BID) | ORAL | 0 refills | Status: DC

## 2017-08-05 MED ORDER — SACUBITRIL-VALSARTAN 24-26 MG PO TABS: 1.0000 | ORAL_TABLET | Freq: Two times a day (BID) | ORAL | 3 refills | Status: DC

## 2017-08-05 NOTE — Telephone Encounter (Signed)
Sent in cover my meds Audrey Gross (Key: [id number]) [name] 24-26MG  OR TABS

## 2017-08-05 NOTE — Telephone Encounter (Signed)
New Message  Geni Bers from [facility] called requesting to speak with RN about getting a pre authorization for pt medication entresto. She states she keels getting refill request and she just needs the authorization .please call back to discuss

## 2017-08-06 NOTE — Telephone Encounter (Signed)
Pa approved  pa [id number]  90 day approx $97 30 day approx $75

## 2017-08-06 NOTE — Telephone Encounter (Signed)
New message ° °Pt verbalized that she is calling for the rn °

## 2017-08-06 NOTE — Telephone Encounter (Signed)
Pt notified she states that she has co-pay card she will call and see if she can use it

## 2017-08-11 ENCOUNTER — Other Ambulatory Visit: Payer: Self-pay | Admitting: Cardiology

## 2017-08-11 ENCOUNTER — Ambulatory Visit (INDEPENDENT_AMBULATORY_CARE_PROVIDER_SITE_OTHER): Payer: BLUE CROSS/BLUE SHIELD | Admitting: Cardiology

## 2017-08-11 ENCOUNTER — Encounter: Payer: Self-pay | Admitting: Cardiology

## 2017-08-11 VITALS — BP 118/84 | HR 106 | Ht 67.0 in | Wt 192.8 lb

## 2017-08-11 DIAGNOSIS — I10 Essential (primary) hypertension: Secondary | ICD-10-CM

## 2017-08-11 DIAGNOSIS — I4819 Other persistent atrial fibrillation: Secondary | ICD-10-CM

## 2017-08-11 DIAGNOSIS — I428 Other cardiomyopathies: Secondary | ICD-10-CM

## 2017-08-11 DIAGNOSIS — E785 Hyperlipidemia, unspecified: Secondary | ICD-10-CM | POA: Diagnosis not present

## 2017-08-11 DIAGNOSIS — I481 Persistent atrial fibrillation: Secondary | ICD-10-CM | POA: Diagnosis not present

## 2017-08-11 NOTE — Progress Notes (Signed)
Electrophysiology Office Note   Date:  08/11/2017   ID:  SABRIYA YONO, DOB 01-06-1961, MRN [id number]  PCP:  Janith Lima, MD  Cardiologist:  [place] Primary Electrophysiologist:  Jearld Hemp Meredith Leeds, MD    Chief Complaint  Patient presents with  . Follow-up    Persistent Afib     History of Present Illness: Audrey Gross is a 56 y.o. female who is being seen today for the evaluation of atrial fibrillaiton at the request of Janith Lima, MD. Presenting today for electrophysiology evaluation. She has a history of CLL, asymptomatic left ventricular dysfunction, and paroxysmal atrial fibrillation possibly related to a pulmonary vein trigger. She had a cardioversion for persistent atrial fibrillation on 04/02/17 with no clinical recurrence. Echocardiogram done September 2018 showed an EF of 30-35% consistent with an echo in May 2018. Left atrium is moderately dilated at 45 mm.  Today, denies symptoms of palpitations, chest pain, shortness of breath, orthopnea, PND, lower extremity edema, claudication, dizziness, presyncope, syncope, bleeding, or neurologic sequela. The patient is tolerating medications without difficulties.  Her main symptoms are due to weakness and fatigue.  She is still in atrial fibrillation today.  She does not notice palpitations.  She has not had chest pain or shortness of breath.  Past Medical History:  Diagnosis Date  . Atrial fibrillation ([place])   . CLL (chronic lymphoblastic leukemia)    Leukemia  . Cystic breast   . Depression   . Diffuse cystic mastopathy   . Dyslipidemia (high LDL; low HDL)   . Hypertension   . Iron deficiency anemia, unspecified   . Lymphadenopathy of head and neck 04/05/2015  . Medical non-compliance   . Mitral regurgitation 02/2017   moderate to severe  . Persistent atrial fibrillation with rapid ventricular response ([place]) 07/15/2017   Past Surgical History:  Procedure Laterality Date  . TUBAL LIGATION       Current  Outpatient Medications  Medication Sig Dispense Refill  . acyclovir (ZOVIRAX) 400 MG tablet Take 1 tablet (400 mg total) by mouth daily. 30 tablet 3  . allopurinol (ZYLOPRIM) 300 MG tablet Take 1 tablet (300 mg total) by mouth daily. 30 tablet 3  . apixaban (ELIQUIS) 5 MG TABS tablet Take 1 tablet (5 mg total) by mouth 2 (two) times daily. 60 tablet 11  . diltiazem (CARDIZEM CD) 240 MG 24 hr capsule Take 1 capsule (240 mg total) by mouth daily. 30 capsule 0  . diltiazem 2 % GEL Apply 1 application topically 3 (three) times daily. Use for 6 weeks 30 g 1  . furosemide (LASIX) 40 MG tablet Take 1 tablet (40 mg total) by mouth daily. 30 tablet 6  . metoprolol tartrate (LOPRESSOR) 25 MG tablet Take 1.5 tablets (37.5 mg total) by mouth 2 (two) times daily. 90 tablet 3  . ondansetron (ZOFRAN) 8 MG tablet Take 1 tablet (8 mg total) by mouth every 8 (eight) hours as needed for refractory nausea / vomiting. 30 tablet 1  . predniSONE (DELTASONE) 10 MG tablet TAKE 1 TABLET BY MOUTH DAILY WITH BREAKFAST 30 tablet 0  . prochlorperazine (COMPAZINE) 10 MG tablet Take 1 tablet (10 mg total) by mouth every 6 (six) hours as needed (Nausea or vomiting). 30 tablet 1  . sacubitril-valsartan (ENTRESTO) 24-26 MG Take 1 tablet by mouth 2 (two) times daily. 60 tablet 3  . spironolactone (ALDACTONE) 25 MG tablet Take 0.5 tablets (12.5 mg total) by mouth daily. 30 tablet 3  . Vitamin D,  Cholecalciferol, 1000 units CAPS Take 1,000 Units by mouth every morning.      No current facility-administered medications for this visit.     Allergies:   Patient has no known allergies.   Social History:  The patient  reports that  has never smoked. she has never used smokeless tobacco. She reports that she does not drink alcohol or use drugs.   Family History:  The patient's family history includes Heart disease in her brother and maternal grandmother; Kidney disease in her father; Stomach cancer in her maternal aunt and paternal  grandmother; Sudden death in her mother.    ROS:  Please see the history of present illness.   Otherwise, review of systems is positive for fatigue, weakness.   All other systems are reviewed and negative.   PHYSICAL EXAM: VS:  BP 118/84   Pulse (!) 106   Ht 5\' 7"  (1.702 m)   Wt 192 lb 12.8 oz (87.5 kg)   BMI 30.20 kg/m  , BMI Body mass index is 30.2 kg/m. GEN: Well nourished, well developed, in no acute distress  HEENT: normal  Neck: no JVD, carotid bruits, or masses Cardiac: iRRR; no murmurs, rubs, or gallops,no edema  Respiratory:  clear to auscultation bilaterally, normal work of breathing GI: soft, nontender, nondistended, + BS MS: no deformity or atrophy  Skin: warm and dry Neuro:  Strength and sensation are intact Psych: euthymic mood, full affect  EKG:  EKG is ordered today. Personal review of the ekg ordered shows atrial fibrillation, LAD, voltage criteria for LVH   Recent Labs: 02/26/2017: B Natriuretic Peptide 207.9 07/15/2017: TSH 0.069 07/18/2017: Magnesium 2.0 07/28/2017: ALT 24; BUN 14.6; Creatinine 0.8; HGB 12.4; Platelets 293; Potassium 4.0; Sodium 142    Lipid Panel     Component Value Date/Time   CHOL 170 07/16/2017 0103   TRIG 100 07/16/2017 0103   HDL 31 (L) 07/16/2017 0103   CHOLHDL 5.5 07/16/2017 0103   VLDL 20 07/16/2017 0103   LDLCALC 119 (H) 07/16/2017 0103   LDLDIRECT 144.5 07/26/2013 0934     Wt Readings from Last 3 Encounters:  08/11/17 192 lb 12.8 oz (87.5 kg)  08/03/17 190 lb (86.2 kg)  07/28/17 194 lb 6.4 oz (88.2 kg)      Other studies Reviewed: Additional studies/ records that were reviewed today include: TTE 06/19/17  Review of the above records today demonstrates:  - Left ventricle: LVEF is depressed at approxiamately 35% with   severe hypokinesis of tine inferior and septal walls The cavity   size was normal. Wall thickness was increased in a pattern of   mild LVH. Systolic function was moderately to severely reduced.    The estimated ejection fraction was in the range of 30% to 35%. - Mitral valve: There was moderate regurgitation. - Left atrium: The atrium was moderately dilated. - Right ventricle: Systolic function was mildly to moderately   reduced.   ASSESSMENT AND PLAN:  1.  Persistent atrial fibrillation: Anticoagulated with Eliquis.  She is currently in atrial fibrillation.  We further discussed medical management versus ablation for atrial fibrillation.  She is not ready for antiarrhythmics or procedures.  We Britteney Ayotte plan to cardiovert her when she is on the [facility] which was recently started.  I Elisse Pennick see her back in 3 months for further discussion.  This patients CHA2DS2-VASc Score and unadjusted Ischemic Stroke Rate (% per year) is equal to 3.2 % stroke rate/year from a score of 3  Above score calculated as  1 point each if present [CHF, HTN, DM, Vascular=MI/PAD/Aortic Plaque, Age if 65-74, or Female] Above score calculated as 2 points each if present [Age > 75, or Stroke/TIA/TE]  2. Essential hypertension: Blood pressure currently well controlled  3. Chronic systolic heart failure: Ejection fraction 35% on most recent echo.  Currently on Coreg and has recently started [place].  4. Hyperlipidemia: Managed with diet and exercise  5. Obesity, Snoring: Sleep study pending   Current medicines are reviewed at length with the patient today.   The patient does not have concerns regarding her medicines.  The following changes were made today:  none  Labs/ tests ordered today include:  Orders Placed This Encounter  Procedures  . Basic Metabolic Panel (BMET)  . CBC w/Diff  . EKG 12-Lead  . EKG 12-Lead     Disposition:   FU with Takoda Janowiak 3 months  Signed, Amna Welker Meredith Leeds, MD  08/11/2017 9:32 AM     [facility] [address] [place] [address] [phone] (office) [phone] (fax)

## 2017-08-11 NOTE — H&P (View-Only) (Signed)
Electrophysiology Office Note   Date:  08/11/2017   ID:  Audrey Gross, DOB Mar 21, 1961, MRN [id number]  PCP:  Janith Lima, MD  Cardiologist:  [place] Primary Electrophysiologist:  Will Meredith Leeds, MD    Chief Complaint  Patient presents with  . Follow-up    Persistent Afib     History of Present Illness: Audrey Gross is a 56 y.o. female who is being seen today for the evaluation of atrial fibrillaiton at the request of Janith Lima, MD. Presenting today for electrophysiology evaluation. She has a history of CLL, asymptomatic left ventricular dysfunction, and paroxysmal atrial fibrillation possibly related to a pulmonary vein trigger. She had a cardioversion for persistent atrial fibrillation on 04/02/17 with no clinical recurrence. Echocardiogram done September 2018 showed an EF of 30-35% consistent with an echo in May 2018. Left atrium is moderately dilated at 45 mm.  Today, denies symptoms of palpitations, chest pain, shortness of breath, orthopnea, PND, lower extremity edema, claudication, dizziness, presyncope, syncope, bleeding, or neurologic sequela. The patient is tolerating medications without difficulties.  Her main symptoms are due to weakness and fatigue.  She is still in atrial fibrillation today.  She does not notice palpitations.  She has not had chest pain or shortness of breath.  Past Medical History:  Diagnosis Date  . Atrial fibrillation ([place])   . CLL (chronic lymphoblastic leukemia)    Leukemia  . Cystic breast   . Depression   . Diffuse cystic mastopathy   . Dyslipidemia (high LDL; low HDL)   . Hypertension   . Iron deficiency anemia, unspecified   . Lymphadenopathy of head and neck 04/05/2015  . Medical non-compliance   . Mitral regurgitation 02/2017   moderate to severe  . Persistent atrial fibrillation with rapid ventricular response ([place]) 07/15/2017   Past Surgical History:  Procedure Laterality Date  . TUBAL LIGATION       Current  Outpatient Medications  Medication Sig Dispense Refill  . acyclovir (ZOVIRAX) 400 MG tablet Take 1 tablet (400 mg total) by mouth daily. 30 tablet 3  . allopurinol (ZYLOPRIM) 300 MG tablet Take 1 tablet (300 mg total) by mouth daily. 30 tablet 3  . apixaban (ELIQUIS) 5 MG TABS tablet Take 1 tablet (5 mg total) by mouth 2 (two) times daily. 60 tablet 11  . diltiazem (CARDIZEM CD) 240 MG 24 hr capsule Take 1 capsule (240 mg total) by mouth daily. 30 capsule 0  . diltiazem 2 % GEL Apply 1 application topically 3 (three) times daily. Use for 6 weeks 30 g 1  . furosemide (LASIX) 40 MG tablet Take 1 tablet (40 mg total) by mouth daily. 30 tablet 6  . metoprolol tartrate (LOPRESSOR) 25 MG tablet Take 1.5 tablets (37.5 mg total) by mouth 2 (two) times daily. 90 tablet 3  . ondansetron (ZOFRAN) 8 MG tablet Take 1 tablet (8 mg total) by mouth every 8 (eight) hours as needed for refractory nausea / vomiting. 30 tablet 1  . predniSONE (DELTASONE) 10 MG tablet TAKE 1 TABLET BY MOUTH DAILY WITH BREAKFAST 30 tablet 0  . prochlorperazine (COMPAZINE) 10 MG tablet Take 1 tablet (10 mg total) by mouth every 6 (six) hours as needed (Nausea or vomiting). 30 tablet 1  . sacubitril-valsartan (ENTRESTO) 24-26 MG Take 1 tablet by mouth 2 (two) times daily. 60 tablet 3  . spironolactone (ALDACTONE) 25 MG tablet Take 0.5 tablets (12.5 mg total) by mouth daily. 30 tablet 3  . Vitamin D,  Cholecalciferol, 1000 units CAPS Take 1,000 Units by mouth every morning.      No current facility-administered medications for this visit.     Allergies:   Patient has no known allergies.   Social History:  The patient  reports that  has never smoked. she has never used smokeless tobacco. She reports that she does not drink alcohol or use drugs.   Family History:  The patient's family history includes Heart disease in her brother and maternal grandmother; Kidney disease in her father; Stomach cancer in her maternal aunt and paternal  grandmother; Sudden death in her mother.    ROS:  Please see the history of present illness.   Otherwise, review of systems is positive for fatigue, weakness.   All other systems are reviewed and negative.   PHYSICAL EXAM: VS:  BP 118/84   Pulse (!) 106   Ht 5\' 7"  (1.702 m)   Wt 192 lb 12.8 oz (87.5 kg)   BMI 30.20 kg/m  , BMI Body mass index is 30.2 kg/m. GEN: Well nourished, well developed, in no acute distress  HEENT: normal  Neck: no JVD, carotid bruits, or masses Cardiac: iRRR; no murmurs, rubs, or gallops,no edema  Respiratory:  clear to auscultation bilaterally, normal work of breathing GI: soft, nontender, nondistended, + BS MS: no deformity or atrophy  Skin: warm and dry Neuro:  Strength and sensation are intact Psych: euthymic mood, full affect  EKG:  EKG is ordered today. Personal review of the ekg ordered shows atrial fibrillation, LAD, voltage criteria for LVH   Recent Labs: 02/26/2017: B Natriuretic Peptide 207.9 07/15/2017: TSH 0.069 07/18/2017: Magnesium 2.0 07/28/2017: ALT 24; BUN 14.6; Creatinine 0.8; HGB 12.4; Platelets 293; Potassium 4.0; Sodium 142    Lipid Panel     Component Value Date/Time   CHOL 170 07/16/2017 0103   TRIG 100 07/16/2017 0103   HDL 31 (L) 07/16/2017 0103   CHOLHDL 5.5 07/16/2017 0103   VLDL 20 07/16/2017 0103   LDLCALC 119 (H) 07/16/2017 0103   LDLDIRECT 144.5 07/26/2013 0934     Wt Readings from Last 3 Encounters:  08/11/17 192 lb 12.8 oz (87.5 kg)  08/03/17 190 lb (86.2 kg)  07/28/17 194 lb 6.4 oz (88.2 kg)      Other studies Reviewed: Additional studies/ records that were reviewed today include: TTE 06/19/17  Review of the above records today demonstrates:  - Left ventricle: LVEF is depressed at approxiamately 35% with   severe hypokinesis of tine inferior and septal walls The cavity   size was normal. Wall thickness was increased in a pattern of   mild LVH. Systolic function was moderately to severely reduced.    The estimated ejection fraction was in the range of 30% to 35%. - Mitral valve: There was moderate regurgitation. - Left atrium: The atrium was moderately dilated. - Right ventricle: Systolic function was mildly to moderately   reduced.   ASSESSMENT AND PLAN:  1.  Persistent atrial fibrillation: Anticoagulated with Eliquis.  She is currently in atrial fibrillation.  We further discussed medical management versus ablation for atrial fibrillation.  She is not ready for antiarrhythmics or procedures.  We will plan to cardiovert her when she is on the [facility] which was recently started.  I will see her back in 3 months for further discussion.  This patients CHA2DS2-VASc Score and unadjusted Ischemic Stroke Rate (% per year) is equal to 3.2 % stroke rate/year from a score of 3  Above score calculated as  1 point each if present [CHF, HTN, DM, Vascular=MI/PAD/Aortic Plaque, Age if 65-74, or Female] Above score calculated as 2 points each if present [Age > 75, or Stroke/TIA/TE]  2. Essential hypertension: Blood pressure currently well controlled  3. Chronic systolic heart failure: Ejection fraction 35% on most recent echo.  Currently on Coreg and has recently started [place].  4. Hyperlipidemia: Managed with diet and exercise  5. Obesity, Snoring: Sleep study pending   Current medicines are reviewed at length with the patient today.   The patient does not have concerns regarding her medicines.  The following changes were made today:  none  Labs/ tests ordered today include:  Orders Placed This Encounter  Procedures  . Basic Metabolic Panel (BMET)  . CBC w/Diff  . EKG 12-Lead  . EKG 12-Lead     Disposition:   FU with Will Camnitz 3 months  Signed, Will Meredith Leeds, MD  08/11/2017 9:32 AM     [facility] [address] [place] [address] [phone] (office) [phone] (fax)

## 2017-08-11 NOTE — Patient Instructions (Addendum)
Medication Instructions:  Your physician recommends that you continue on your current medications as directed. Please refer to the Current Medication list given to you today.  -- If you need a refill on your cardiac medications before your next appointment, please call your pharmacy. --  Labwork: Your physician recommends that you return for lab work today: CBC/BMET   Testing/Procedures: Your physician has recommended that you have a Cardioversion (DCCV). Electrical Cardioversion uses a jolt of electricity to your heart either through paddles or wired patches attached to your chest. This is a controlled, usually prescheduled, procedure. Defibrillation is done under light anesthesia in the hospital, and you usually go home the day of the procedure. This is done to get your heart back into a normal rhythm. You are not awake for the procedure. Please see the instruction sheet given to you today. ___ 11/14 @ 1:00    Follow-Up: Your physician wants you to follow-up in: 3 months with Dr. Curt Bears.  You will receive a reminder letter in the mail two months in advance. If you don't receive a letter, please call our office to schedule the follow-up appointment.    Thank you for choosing [facility]!!   Frederik Schmidt, RN [phone]  Any Other Special Instructions Will Be Listed Below (If Applicable).

## 2017-08-12 ENCOUNTER — Ambulatory Visit: Payer: Self-pay | Admitting: Hematology and Oncology

## 2017-08-12 ENCOUNTER — Telehealth: Payer: Self-pay

## 2017-08-12 ENCOUNTER — Other Ambulatory Visit: Payer: Self-pay

## 2017-08-12 ENCOUNTER — Ambulatory Visit: Payer: Self-pay

## 2017-08-12 DIAGNOSIS — E876 Hypokalemia: Secondary | ICD-10-CM

## 2017-08-12 LAB — BASIC METABOLIC PANEL
BUN / CREAT RATIO: 10 (ref 9–23)
BUN: 9 mg/dL (ref 6–24)
CALCIUM: 9.3 mg/dL (ref 8.7–10.2)
CHLORIDE: 104 mmol/L (ref 96–106)
CO2: 22 mmol/L (ref 20–29)
Creatinine, Ser: 0.88 mg/dL (ref 0.57–1.00)
GFR calc non Af Amer: 74 mL/min/{1.73_m2} (ref >59)
GFR, EST AFRICAN AMERICAN: 85 mL/min/{1.73_m2} (ref >59)
Glucose: 102 mg/dL — ABNORMAL HIGH (ref 65–99)
POTASSIUM: 3.4 mmol/L — AB (ref 3.5–5.2)
SODIUM: 141 mmol/L (ref 134–144)

## 2017-08-12 LAB — CBC WITH DIFFERENTIAL/PLATELET
BASOS ABS: 0 10*3/uL (ref 0.0–0.2)
Basos: 0 %
EOS (ABSOLUTE): 0.8 10*3/uL — AB (ref 0.0–0.4)
Eos: 1 %
HEMATOCRIT: 36.6 % (ref 34.0–46.6)
HEMOGLOBIN: 11.9 g/dL (ref 11.1–15.9)
LYMPHS ABS: 73.5 10*3/uL — AB (ref 0.7–3.1)
Lymphs: 92 %
MCH: 27.8 pg (ref 26.6–33.0)
MCHC: 32.5 g/dL (ref 31.5–35.7)
MCV: 86 fL (ref 79–97)
MONOCYTES: 1 %
MONOS ABS: 0.8 10*3/uL (ref 0.1–0.9)
Neutrophils Absolute: 4.8 10*3/uL (ref 1.4–7.0)
Neutrophils: 6 %
Platelets: 342 10*3/uL (ref 150–379)
RBC: 4.28 x10E6/uL (ref 3.77–5.28)
RDW: 17.2 % — AB (ref 12.3–15.4)
WBC: 79.9 10*3/uL — AB (ref 3.4–10.8)

## 2017-08-12 NOTE — Telephone Encounter (Signed)
-----   Message from Will Meredith Leeds, MD sent at 08/12/2017 11:56 AM EST ----- Pre procedure labs stable. Recheck BMP in 1 week for K.

## 2017-08-12 NOTE — Telephone Encounter (Signed)
Tried calling patient on cell phone to give recent lab results and vm isn't set up and home number doesn't have a vm set up. Will try again later.

## 2017-08-13 ENCOUNTER — Ambulatory Visit: Payer: Self-pay

## 2017-08-14 NOTE — Telephone Encounter (Signed)
-----   Message from Will Meredith Leeds, MD sent at 08/12/2017 11:56 AM EST ----- Pre procedure labs stable. Recheck BMP in 1 week for K.

## 2017-08-17 ENCOUNTER — Other Ambulatory Visit: Payer: Self-pay | Admitting: *Deleted

## 2017-08-17 NOTE — Telephone Encounter (Signed)
Patient left a msg on the refill vm stating that she was started on this med while in the hospital and she is questioning if she needs to continue to take it. If so, she needs it to be ref[facility] to [facility] [address].

## 2017-08-18 MED ORDER — DILTIAZEM HCL ER COATED BEADS 240 MG PO CP24: 240.0000 mg | ORAL_CAPSULE | Freq: Every day | ORAL | 5 refills | Status: DC

## 2017-08-18 NOTE — Telephone Encounter (Signed)
This is Dr. Victorino December pt. Dr.Croitoru is pt's primary cardiologist. Please address

## 2017-08-18 NOTE — Telephone Encounter (Signed)
Rx(s) sent to pharmacy electronically.  

## 2017-08-19 ENCOUNTER — Encounter (HOSPITAL_COMMUNITY)
Admission: RE | Disposition: A | Discharge status: Home / Self Care | Payer: Self-pay | Source: Ambulatory Visit | Attending: Cardiology

## 2017-08-19 ENCOUNTER — Ambulatory Visit (HOSPITAL_COMMUNITY): Payer: BLUE CROSS/BLUE SHIELD | Admitting: Anesthesiology

## 2017-08-19 ENCOUNTER — Ambulatory Visit (HOSPITAL_COMMUNITY)
Admission: RE | Admit: 2017-08-19 | Discharge: 2017-08-19 | Disposition: A | Discharge status: Home / Self Care | Payer: BLUE CROSS/BLUE SHIELD | Source: Ambulatory Visit | Attending: Cardiology | Admitting: Cardiology

## 2017-08-19 ENCOUNTER — Telehealth: Payer: Self-pay | Admitting: Cardiology

## 2017-08-19 ENCOUNTER — Encounter (HOSPITAL_COMMUNITY): Payer: Self-pay

## 2017-08-19 DIAGNOSIS — I10 Essential (primary) hypertension: Secondary | ICD-10-CM | POA: Diagnosis not present

## 2017-08-19 DIAGNOSIS — Z9114 Patient's other noncompliance with medication regimen: Secondary | ICD-10-CM | POA: Insufficient documentation

## 2017-08-19 DIAGNOSIS — I481 Persistent atrial fibrillation: Secondary | ICD-10-CM | POA: Diagnosis not present

## 2017-08-19 DIAGNOSIS — I480 Paroxysmal atrial fibrillation: Secondary | ICD-10-CM | POA: Diagnosis not present

## 2017-08-19 DIAGNOSIS — I429 Cardiomyopathy, unspecified: Secondary | ICD-10-CM | POA: Diagnosis not present

## 2017-08-19 DIAGNOSIS — E785 Hyperlipidemia, unspecified: Secondary | ICD-10-CM | POA: Insufficient documentation

## 2017-08-19 DIAGNOSIS — Z7901 Long term (current) use of anticoagulants: Secondary | ICD-10-CM | POA: Insufficient documentation

## 2017-08-19 DIAGNOSIS — F329 Major depressive disorder, single episode, unspecified: Secondary | ICD-10-CM | POA: Insufficient documentation

## 2017-08-19 DIAGNOSIS — I4891 Unspecified atrial fibrillation: Secondary | ICD-10-CM | POA: Diagnosis not present

## 2017-08-19 DIAGNOSIS — Z79899 Other long term (current) drug therapy: Secondary | ICD-10-CM | POA: Insufficient documentation

## 2017-08-19 DIAGNOSIS — I110 Hypertensive heart disease with heart failure: Secondary | ICD-10-CM | POA: Insufficient documentation

## 2017-08-19 DIAGNOSIS — C9110 Chronic lymphocytic leukemia of B-cell type not having achieved remission: Secondary | ICD-10-CM | POA: Insufficient documentation

## 2017-08-19 DIAGNOSIS — I5022 Chronic systolic (congestive) heart failure: Secondary | ICD-10-CM | POA: Insufficient documentation

## 2017-08-19 HISTORY — PX: CARDIOVERSION: SHX1299

## 2017-08-19 LAB — POCT I-STAT 4, (NA,K, GLUC, HGB,HCT)
Glucose, Bld: 99 mg/dL (ref 65–99)
HCT: 40 % (ref 36.0–46.0)
Hemoglobin: 13.6 g/dL (ref 12.0–15.0)
POTASSIUM: 5.3 mmol/L — AB (ref 3.5–5.1)
SODIUM: 140 mmol/L (ref 135–145)

## 2017-08-19 SURGERY — CARDIOVERSION
Anesthesia: General

## 2017-08-19 MED ORDER — PROPOFOL 10 MG/ML IV BOLUS
INTRAVENOUS | Status: DC | PRN
  Administered 2017-08-19: 100 mg via INTRAVENOUS

## 2017-08-19 MED ORDER — SODIUM CHLORIDE 0.9 % IV SOLN
INTRAVENOUS | Status: DC | PRN
  Administered 2017-08-19: 12:00:00 via INTRAVENOUS

## 2017-08-19 MED ORDER — LIDOCAINE HCL (CARDIAC) 20 MG/ML IV SOLN
INTRAVENOUS | Status: DC | PRN
  Administered 2017-08-19: 60 mg via INTRAVENOUS

## 2017-08-19 NOTE — Anesthesia Preprocedure Evaluation (Addendum)
Anesthesia Evaluation  Patient identified by MRN, date of birth, ID band Patient awake    Reviewed: Allergy & Precautions, H&P , NPO status , Patient's Chart, lab work & pertinent test results  Airway Mallampati: III  TM Distance: >3 FB Neck ROM: Full    Dental no notable dental hx. (+) Teeth Intact, Dental Advisory Given   Pulmonary neg pulmonary ROS,    Pulmonary exam normal breath sounds clear to auscultation       Cardiovascular hypertension, Pt. on medications and Pt. on home beta blockers + dysrhythmias Atrial Fibrillation + Valvular Problems/Murmurs MR  Rhythm:Irregular Rate:Normal     Neuro/Psych Depression negative neurological ROS     GI/Hepatic negative GI ROS, Neg liver ROS,   Endo/Other  negative endocrine ROS  Renal/GU negative Renal ROS  negative genitourinary   Musculoskeletal   Abdominal   Peds  Hematology negative hematology ROS (+) anemia ,   Anesthesia Other Findings   Reproductive/Obstetrics negative OB ROS                            Anesthesia Physical Anesthesia Plan  ASA: III  Anesthesia Plan: General   Post-op Pain Management:    Induction: Intravenous  PONV Risk Score and Plan: 3 and Treatment may vary due to age or medical condition  Airway Management Planned: Mask  Additional Equipment:   Intra-op Plan:   Post-operative Plan:   Informed Consent: I have reviewed the patients History and Physical, chart, labs and discussed the procedure including the risks, benefits and alternatives for the proposed anesthesia with the patient or authorized representative who has indicated his/her understanding and acceptance.   Dental advisory given  Plan Discussed with: CRNA and Surgeon  Anesthesia Plan Comments:         Anesthesia Quick Evaluation

## 2017-08-19 NOTE — Anesthesia Postprocedure Evaluation (Signed)
Anesthesia Post Note  Patient: Audrey Gross  Procedure(s) Performed: CARDIOVERSION (N/A )     Patient location during evaluation: PACU Anesthesia Type: General Level of consciousness: awake and alert Pain management: pain level controlled Vital Signs Assessment: post-procedure vital signs reviewed and stable Respiratory status: spontaneous breathing, nonlabored ventilation and respiratory function stable Cardiovascular status: blood pressure returned to baseline and stable Postop Assessment: no apparent nausea or vomiting Anesthetic complications: no    Last Vitals:  Vitals:   08/19/17 1320 08/19/17 1330  BP: 121/86 125/83  Pulse: 89 93  Resp: 20 20  Temp:    SpO2: 100% 99%    Last Pain:  Vitals:   08/19/17 1307  TempSrc: Oral                 Jeryn Cerney,W. EDMOND

## 2017-08-19 NOTE — Anesthesia Procedure Notes (Signed)
Date/Time: 08/19/2017 12:46 PM Performed by: Izora Gala, CRNA Pre-anesthesia Checklist: Patient identified Patient Re-evaluated:Patient Re-evaluated prior to induction Oxygen Delivery Method: Ambu bag Preoxygenation: Pre-oxygenation with 100% oxygen Induction Type: IV induction Ventilation: Mask ventilation without difficulty Placement Confirmation: positive ETCO2 Dental Injury: Teeth and Oropharynx as per pre-operative assessment

## 2017-08-19 NOTE — Telephone Encounter (Signed)
Informed pt she is still scheduled for DCCV today.  Advised to arrive at 11:30am for a 12:45pm procedure time.  She wasn't sure with her K+ low (3.4), last week, if she could proceed.  Informed pt that if physician performing procedure wishes to re-check then they will prior to procedure.  Patient verbalized understanding and agreeable to plan.

## 2017-08-19 NOTE — Transfer of Care (Signed)
Immediate Anesthesia Transfer of Care Note  Patient: Audrey Gross  Procedure(s) Performed: CARDIOVERSION (N/A )  Patient Location: PACU  Anesthesia Type:General  Level of Consciousness: awake  Airway & Oxygen Therapy: Patient Spontanous Breathing and Patient connected to nasal cannula oxygen  Post-op Assessment: Report given to RN and Post -op Vital signs reviewed and stable  Post vital signs: Reviewed and stable  Last Vitals:  Vitals:   08/19/17 1133  BP: 100/63  Pulse: (!) 116  Resp: 15  Temp: 37 C  SpO2: 97%    Last Pain:  Vitals:   08/19/17 1133  TempSrc: Oral         Complications: No apparent anesthesia complications

## 2017-08-19 NOTE — Procedures (Signed)
Electrical Cardioversion Procedure Note Audrey Gross [id number] [birth year]  Procedure: Electrical Cardioversion Indications:  Atrial Fibrillation  Procedure Details Consent: Risks of procedure as well as the alternatives and risks of each were explained to the (patient/caregiver).  Consent for procedure obtained. Time Out: Verified patient identification, verified procedure, site/side was marked, verified correct patient position, special equipment/implants available, medications/allergies/relevent history reviewed, required imaging and test results available.  Performed  Patient placed on cardiac monitor, pulse oximetry, supplemental oxygen as necessary.  Sedation given: Propofol Pacer pads placed anterior and posterior chest.  Cardioverted 1 time(s).  Cardioverted at [place].  Evaluation Findings: Post procedure EKG shows: NSR Complications: None Patient did tolerate procedure well.   Loralie Champagne 08/19/2017, 12:48 PM

## 2017-08-19 NOTE — Interval H&P Note (Signed)
History and Physical Interval Note:  08/19/2017 12:45 PM  Audrey Gross  has presented today for surgery, with the diagnosis of AFIB  The various methods of treatment have been discussed with the patient and family. After consideration of risks, benefits and other options for treatment, the patient has consented to  Procedure(s): CARDIOVERSION (N/A) as a surgical intervention .  The patient's history has been reviewed, patient examined, no change in status, stable for surgery.  I have reviewed the patient's chart and labs.  Questions were answered to the patient's satisfaction.     Kastin Cerda [employer]

## 2017-08-19 NOTE — Telephone Encounter (Signed)
Follow Up:    Pt wants to know if she is still scheduled  For her Cardioversion today?

## 2017-08-20 ENCOUNTER — Encounter (HOSPITAL_COMMUNITY): Payer: Self-pay | Admitting: Cardiology

## 2017-08-21 ENCOUNTER — Ambulatory Visit: Payer: BLUE CROSS/BLUE SHIELD | Admitting: Nurse Practitioner

## 2017-08-21 ENCOUNTER — Other Ambulatory Visit: Payer: BLUE CROSS/BLUE SHIELD | Admitting: *Deleted

## 2017-08-21 ENCOUNTER — Encounter: Payer: Self-pay | Admitting: Nurse Practitioner

## 2017-08-21 VITALS — BP 138/68 | HR 70 | Ht 67.0 in | Wt 193.0 lb

## 2017-08-21 DIAGNOSIS — K602 Anal fissure, unspecified: Secondary | ICD-10-CM | POA: Diagnosis not present

## 2017-08-21 DIAGNOSIS — E876 Hypokalemia: Secondary | ICD-10-CM

## 2017-08-21 NOTE — Progress Notes (Signed)
     Chief Complaint:  Follow up on anal fissure  HPI: Patient is a 56 year old female who I saw in mid October for rectal pain and bleeding.  Midline anterior fissure was seen on exam.  She was treated with diltiazem gel and sitz baths.  Patient here for follow-up.  Has been a little difficult for her to apply the diltiazem gel but she is using it twice a day.  The rectal pain and bleeding have both resolved.  She is being extra careful not to strain   Past Medical History:  Diagnosis Date  . Atrial fibrillation ([place])   . CLL (chronic lymphoblastic leukemia)    Leukemia  . Cystic breast   . Depression   . Diffuse cystic mastopathy   . Dyslipidemia (high LDL; low HDL)   . Hypertension   . Iron deficiency anemia, unspecified   . Lymphadenopathy of head and neck 04/05/2015  . Medical non-compliance   . Mitral regurgitation 02/2017   moderate to severe  . Persistent atrial fibrillation with rapid ventricular response ([place]) 07/15/2017    Patient's surgical history, family medical history, social history, medications and allergies were all reviewed in Epic    Physical Exam: BP 138/68   Pulse 70   Ht 5\' 7"  (1.702 m)   Wt 193 lb (87.5 kg)   BMI 30.23 kg/m   GENERAL:  Overweight black female in NAD PSYCH: : seems a little nervous / anxious but cooperative CARDIAC:  RRR, no murmur heard, no peripheral edema PULM: Normal respiratory effort, lungs CTA bilaterally, no wheezing NEURO: Alert and oriented x 3, no focal neurologic deficits  ASSESSMENT and PLAN:  1. Pleasant 55 year old female here for follow up on anal fissure. She is on 4th week of Diltiazem gel. The rectal bleeding / pain have both resolved.  -recommend she complete remainder of diltiazem gel Rx, hopefully she has a couple of weeks left.  -reminded her not to strain. She can use Dulcolax supp as needed to facilitate evacuation.  -call if recurrent rectal bleeding / pain. She is up to date on colonoscopy, next one  due year 2023.   2. CLL, currently undergoing tx.   3. Persistent atrial fibrillation, on Eliquis. She is s/p cardioversion 2 days ago.   4. Systolic dysfunction.  Her EF is at 30-35%.    Tye Savoy , NP 08/21/2017, 1:35 PM

## 2017-08-21 NOTE — Progress Notes (Signed)
I agree with the above note, plan 

## 2017-08-21 NOTE — Patient Instructions (Addendum)
If you are age 56 or older, your body mass index should be between 23-30. Your Body mass index is 30.23 kg/m. If this is out of the aforementioned range listed, please consider follow up with your Primary Care Provider.  If you are age 52 or younger, your body mass index should be between 19-25. Your Body mass index is 30.23 kg/m. If this is out of the aformentioned range listed, please consider follow up with your Primary Care Provider.   Complete additional two weeks of Diltiazem gel, then stop.  Don't strain to have bowel movement.  May use Glycerin suppositories as needed.  Follow up as needed.  Thank you for choosing me and [facility].   Tye Savoy, NP

## 2017-08-22 LAB — BASIC METABOLIC PANEL
BUN / CREAT RATIO: 13 (ref 9–23)
BUN: 10 mg/dL (ref 6–24)
CHLORIDE: 103 mmol/L (ref 96–106)
CO2: 23 mmol/L (ref 20–29)
Calcium: 9.1 mg/dL (ref 8.7–10.2)
Creatinine, Ser: 0.76 mg/dL (ref 0.57–1.00)
GFR calc non Af Amer: 88 mL/min/{1.73_m2} (ref >59)
GFR, EST AFRICAN AMERICAN: 101 mL/min/{1.73_m2} (ref >59)
Glucose: 111 mg/dL — ABNORMAL HIGH (ref 65–99)
POTASSIUM: 3.9 mmol/L (ref 3.5–5.2)
SODIUM: 142 mmol/L (ref 134–144)

## 2017-08-24 ENCOUNTER — Ambulatory Visit (HOSPITAL_BASED_OUTPATIENT_CLINIC_OR_DEPARTMENT_OTHER): Payer: BLUE CROSS/BLUE SHIELD

## 2017-08-24 ENCOUNTER — Other Ambulatory Visit (HOSPITAL_BASED_OUTPATIENT_CLINIC_OR_DEPARTMENT_OTHER): Payer: BLUE CROSS/BLUE SHIELD

## 2017-08-24 ENCOUNTER — Ambulatory Visit (HOSPITAL_BASED_OUTPATIENT_CLINIC_OR_DEPARTMENT_OTHER): Payer: BLUE CROSS/BLUE SHIELD | Admitting: Hematology and Oncology

## 2017-08-24 ENCOUNTER — Telehealth: Payer: Self-pay | Admitting: Hematology and Oncology

## 2017-08-24 ENCOUNTER — Encounter: Payer: Self-pay | Admitting: Hematology and Oncology

## 2017-08-24 VITALS — BP 116/78 | HR 72 | Temp 98.7°F | Resp 18 | Ht 67.0 in | Wt 192.3 lb

## 2017-08-24 DIAGNOSIS — C9110 Chronic lymphocytic leukemia of B-cell type not having achieved remission: Secondary | ICD-10-CM

## 2017-08-24 DIAGNOSIS — I428 Other cardiomyopathies: Secondary | ICD-10-CM

## 2017-08-24 DIAGNOSIS — Z5111 Encounter for antineoplastic chemotherapy: Secondary | ICD-10-CM | POA: Diagnosis not present

## 2017-08-24 DIAGNOSIS — I480 Paroxysmal atrial fibrillation: Secondary | ICD-10-CM

## 2017-08-24 DIAGNOSIS — E790 Hyperuricemia without signs of inflammatory arthritis and tophaceous disease: Secondary | ICD-10-CM

## 2017-08-24 LAB — CBC WITH DIFFERENTIAL/PLATELET
BASO%: 0.2 % (ref 0.0–2.0)
Basophils Absolute: 0.2 10*3/uL — ABNORMAL HIGH (ref 0.0–0.1)
EOS%: 0.5 % (ref 0.0–7.0)
Eosinophils Absolute: 0.6 10*3/uL — ABNORMAL HIGH (ref 0.0–0.5)
HEMATOCRIT: 38 % (ref 34.8–46.6)
HEMOGLOBIN: 11.7 g/dL (ref 11.6–15.9)
LYMPH#: 107.4 10*3/uL — AB (ref 0.9–3.3)
LYMPH%: 90.3 % — ABNORMAL HIGH (ref 14.0–49.7)
MCH: 26.8 pg (ref 25.1–34.0)
MCHC: 30.7 g/dL — AB (ref 31.5–36.0)
MCV: 87.2 fL (ref 79.5–101.0)
MONO#: 3.6 10*3/uL — AB (ref 0.1–0.9)
MONO%: 3 % (ref 0.0–14.0)
NEUT#: 7.1 10*3/uL — ABNORMAL HIGH (ref 1.5–6.5)
NEUT%: 6 % — ABNORMAL LOW (ref 38.4–76.8)
PLATELETS: 321 10*3/uL (ref 145–400)
RBC: 4.36 10*6/uL (ref 3.70–5.45)
RDW: 18.2 % — ABNORMAL HIGH (ref 11.2–14.5)
WBC: 119 10*3/uL (ref 3.9–10.3)

## 2017-08-24 LAB — TECHNOLOGIST REVIEW

## 2017-08-24 LAB — COMPREHENSIVE METABOLIC PANEL
ALBUMIN: 3.4 g/dL — AB (ref 3.5–5.0)
ALT: 16 U/L (ref 0–55)
AST: 16 U/L (ref 5–34)
Alkaline Phosphatase: 51 U/L (ref 40–150)
Anion Gap: 13 mEq/L — ABNORMAL HIGH (ref 3–11)
BUN: 10.4 mg/dL (ref 7.0–26.0)
CHLORIDE: 106 meq/L (ref 98–109)
CO2: 22 mEq/L (ref 22–29)
Calcium: 9.4 mg/dL (ref 8.4–10.4)
Creatinine: 0.8 mg/dL (ref 0.6–1.1)
GLUCOSE: 143 mg/dL — AB (ref 70–140)
POTASSIUM: 3.2 meq/L — AB (ref 3.5–5.1)
SODIUM: 141 meq/L (ref 136–145)
Total Bilirubin: 0.62 mg/dL (ref 0.20–1.20)
Total Protein: 6.1 g/dL — ABNORMAL LOW (ref 6.4–8.3)

## 2017-08-24 LAB — URIC ACID: Uric Acid, Serum: 4.1 mg/dl (ref 2.6–7.4)

## 2017-08-24 MED ORDER — DIPHENHYDRAMINE HCL 25 MG PO CAPS: 50.0000 mg | ORAL_CAPSULE | Freq: Once | ORAL | Status: DC

## 2017-08-24 MED ORDER — DEXAMETHASONE SODIUM PHOSPHATE 10 MG/ML IJ SOLN
INTRAMUSCULAR | Status: AC
  Filled 2017-08-24: qty 1

## 2017-08-24 MED ORDER — ACETAMINOPHEN 325 MG PO TABS
650.0000 mg | ORAL_TABLET | Freq: Once | ORAL | Status: AC
  Administered 2017-08-24: 650 mg via ORAL

## 2017-08-24 MED ORDER — SODIUM CHLORIDE 0.9 % IV SOLN
90.0000 mg/m2 | Freq: Once | INTRAVENOUS | Status: AC
  Administered 2017-08-24: 175 mg via INTRAVENOUS
  Filled 2017-08-24: qty 7

## 2017-08-24 MED ORDER — FAMOTIDINE IN NACL 20-0.9 MG/50ML-% IV SOLN: 20.0000 mg | Freq: Once | INTRAVENOUS | Status: DC

## 2017-08-24 MED ORDER — PALONOSETRON HCL INJECTION 0.25 MG/5ML
INTRAVENOUS | Status: AC
  Filled 2017-08-24: qty 5

## 2017-08-24 MED ORDER — FAMOTIDINE IN NACL 20-0.9 MG/50ML-% IV SOLN
INTRAVENOUS | Status: AC
  Filled 2017-08-24: qty 50

## 2017-08-24 MED ORDER — SODIUM CHLORIDE 0.9 % IV SOLN
Freq: Once | INTRAVENOUS | Status: AC
  Administered 2017-08-24: 11:00:00 via INTRAVENOUS

## 2017-08-24 MED ORDER — DEXAMETHASONE SODIUM PHOSPHATE 10 MG/ML IJ SOLN
10.0000 mg | Freq: Once | INTRAMUSCULAR | Status: AC
  Administered 2017-08-24: 10 mg via INTRAVENOUS

## 2017-08-24 MED ORDER — PALONOSETRON HCL INJECTION 0.25 MG/5ML
0.2500 mg | Freq: Once | INTRAVENOUS | Status: AC
  Administered 2017-08-24: 0.25 mg via INTRAVENOUS

## 2017-08-24 MED ORDER — DIPHENHYDRAMINE HCL 25 MG PO CAPS
ORAL_CAPSULE | ORAL | Status: AC
  Filled 2017-08-24: qty 2

## 2017-08-24 MED ORDER — ACETAMINOPHEN 325 MG PO TABS
ORAL_TABLET | ORAL | Status: AC
  Filled 2017-08-24: qty 2

## 2017-08-24 NOTE — Telephone Encounter (Signed)
Gave avs and calendar for December  °

## 2017-08-24 NOTE — Assessment & Plan Note (Signed)
She has recent successful cardioversion  She would continue medical management as directed

## 2017-08-24 NOTE — Progress Notes (Signed)
[facility] OFFICE PROGRESS NOTE  Patient Care Team: Janith Lima, MD as PCP - General (Internal Medicine)  SUMMARY OF ONCOLOGIC HISTORY:   CLL (chronic lymphocytic leukemia) ([place])   04/05/2015 Pathology Results    Accession: [id number] flow cytometry confirmed CLL. FISH was positive for p53 mutation      04/24/2015 Imaging    Extensive lymphadenopathy throughout the neck, chest (axilla), abdomen and pelvis, as detailed above, compatible with the reported clinical history of lymphoma. 2. Mild splenomegaly.      05/03/2015 - 08/27/2015 Chemotherapy    She started on Ibrutinib, discontinued prematurely when her prescription ran out      10/10/2015 - 05/29/2017 Chemotherapy    She was restarted back on Ibrutinib      11/13/2016 PET scan    Significant generalized reduction in size of numerous lymph nodes in the neck, chest, abdomen, and pelvis. Previously the activity of these nodes was low-level and in general a similar low-level activity is present today, significantly less than mediastinal blood pool activity, compatible with [place] 2. 2. Coronary atherosclerosis. Mild cardiomegaly. 3. Mildly prominent endometrium for age without accentuated metabolic activity in the endometrium. Consider pelvic sonography for further characterization.      05/27/2017 PET scan    1. Progressive hypermetabolic adenopathy, primarily involving cervical, axillary, pelvic and inguinal lymph nodes bilaterally, consistent with progressive lymphoma. 2. No solid visceral organ or osseous involvement.      06/16/2017 -  Chemotherapy    The patient had palonosetron (ALOXI) injection 0.25 mg, 0.25 mg, Intravenous,  Once, 1 of 4 cycles  riTUXimab (RITUXAN) 200 mg in sodium chloride 0.9 % 250 mL (0.7407 mg/mL) chemo infusion, 100 mg/m2 = 200 mg (100 % of original dose 100 mg/m2), Intravenous,  Once, 1 of 4 cycles Dose modification: 100 mg/m2 (original dose 100 mg/m2, Cycle 1, Reason: Provider  Judgment, Comment: anticipate high risk infusion)  bendamustine (BENDEKA) 175 mg in sodium chloride 0.9 % 50 mL (3.0702 mg/mL) chemo infusion, 90 mg/m2 = 175 mg, Intravenous,  Once, 1 of 4 cycles  for chemotherapy treatment.  She received Rituximab and Bendamustine      07/15/2017 - 07/19/2017 Hospital Admission    The patient was briefly admitted to the hospital due to infusion reaction to rituximab       INTERVAL HISTORY: Please see below for problem oriented charting. She returns today for further follow-up She denies new lymphadenopathy She had recent successful cardioversion Denies chest pain or shortness of breath No leg swelling No recent infection  REVIEW OF SYSTEMS:   Constitutional: Denies fevers, chills or abnormal weight loss Eyes: Denies blurriness of vision Ears, nose, mouth, throat, and face: Denies mucositis or sore throat Respiratory: Denies cough, dyspnea or wheezes Cardiovascular: Denies palpitation, chest discomfort or lower extremity swelling Gastrointestinal:  Denies nausea, heartburn or change in bowel habits Skin: Denies abnormal skin rashes Lymphatics: Denies new lymphadenopathy or easy bruising Neurological:Denies numbness, tingling or new weaknesses Behavioral/Psych: Mood is stable, no new changes  All other systems were reviewed with the patient and are negative.  I have reviewed the past medical history, past surgical history, social history and family history with the patient and they are unchanged from previous note.  ALLERGIES:  has No Known Allergies.  MEDICATIONS:  Current Outpatient Medications  Medication Sig Dispense Refill  . acyclovir (ZOVIRAX) 400 MG tablet Take 1 tablet (400 mg total) by mouth daily. 30 tablet 3  . allopurinol (ZYLOPRIM) 300 MG tablet Take 1  tablet (300 mg total) by mouth daily. 30 tablet 3  . apixaban (ELIQUIS) 5 MG TABS tablet Take 1 tablet (5 mg total) by mouth 2 (two) times daily. 60 tablet 11  . diltiazem  (CARDIZEM CD) 240 MG 24 hr capsule Take 1 capsule (240 mg total) daily by mouth. 30 capsule 5  . diltiazem 2 % GEL Apply 1 application topically 3 (three) times daily. Use for 6 weeks (Patient taking differently: Apply 1 application 2 (two) times daily topically. Use for 6 weeks) 30 g 1  . furosemide (LASIX) 40 MG tablet Take 1 tablet (40 mg total) by mouth daily. 30 tablet 6  . metoprolol tartrate (LOPRESSOR) 25 MG tablet Take 1.5 tablets (37.5 mg total) by mouth 2 (two) times daily. 90 tablet 3  . ondansetron (ZOFRAN) 8 MG tablet Take 1 tablet (8 mg total) by mouth every 8 (eight) hours as needed for refractory nausea / vomiting. 30 tablet 1  . predniSONE (DELTASONE) 10 MG tablet TAKE 1 TABLET BY MOUTH DAILY WITH BREAKFAST (Patient taking differently: TAKE 10 MG BY MOUTH DAILY WITH BREAKFAST) 30 tablet 0  . prochlorperazine (COMPAZINE) 10 MG tablet Take 1 tablet (10 mg total) by mouth every 6 (six) hours as needed (Nausea or vomiting). 30 tablet 1  . sacubitril-valsartan (ENTRESTO) 24-26 MG Take 1 tablet by mouth 2 (two) times daily. 60 tablet 3  . spironolactone (ALDACTONE) 25 MG tablet Take 0.5 tablets (12.5 mg total) by mouth daily. 30 tablet 3  . Vitamin D, Cholecalciferol, 1000 units CAPS Take 1,000 Units by mouth every morning.      No current facility-administered medications for this visit.    Facility-Administered Medications Ordered in Other Visits  Medication Dose Route Frequency Provider Last Rate Last Dose  . [COMPLETED] palonosetron (ALOXI) injection 0.25 mg  0.25 mg Intravenous Once Alvy Bimler, Choya Tornow, MD   0.25 mg at 08/24/17 1122    PHYSICAL EXAMINATION: ECOG PERFORMANCE STATUS: 1 - Symptomatic but completely ambulatory  Vitals:   08/24/17 0924  BP: 116/78  Pulse: 72  Resp: 18  Temp: 98.7 F (37.1 C)  SpO2: 99%   Filed Weights   08/24/17 0924  Weight: 192 lb 4.8 oz (87.2 kg)    GENERAL:alert, no distress and comfortable SKIN: skin color, texture, turgor are normal, no  rashes or significant lesions EYES: normal, Conjunctiva are pink and non-injected, sclera clear OROPHARYNX:no exudate, no erythema and lips, buccal mucosa, and tongue normal  NECK: supple, thyroid normal size, non-tender, without nodularity LYMPH:  no palpable lymphadenopathy in the cervical, axillary or inguinal LUNGS: clear to auscultation and percussion with normal breathing effort HEART: regular rate & rhythm and no murmurs and no lower extremity edema ABDOMEN:abdomen soft, non-tender and normal bowel sounds Musculoskeletal:no cyanosis of digits and no clubbing  NEURO: alert & oriented x 3 with fluent speech, no focal motor/sensory deficits  LABORATORY DATA:  I have reviewed the data as listed    Component Value Date/Time   NA 141 08/24/2017 0908   K 3.2 (L) 08/24/2017 0908   CL 103 08/21/2017 1415   CL 105 01/18/2013 0912   CO2 22 08/24/2017 0908   GLUCOSE 143 (H) 08/24/2017 0908   GLUCOSE 83 01/18/2013 0912   BUN 10.4 08/24/2017 0908   CREATININE 0.8 08/24/2017 0908   CALCIUM 9.4 08/24/2017 0908   PROT 6.1 (L) 08/24/2017 0908   ALBUMIN 3.4 (L) 08/24/2017 0908   AST 16 08/24/2017 0908   ALT 16 08/24/2017 0908   ALKPHOS 51  08/24/2017 0908   BILITOT 0.62 08/24/2017 0908   GFRNONAA 88 08/21/2017 1415   GFRAA 101 08/21/2017 1415    No results found for: SPEP, UPEP  Lab Results  Component Value Date   WBC 119.0 (HH) 08/24/2017   NEUTROABS 7.1 (H) 08/24/2017   HGB 11.7 08/24/2017   HCT 38.0 08/24/2017   MCV 87.2 08/24/2017   PLT 321 08/24/2017      Chemistry      Component Value Date/Time   NA 141 08/24/2017 0908   K 3.2 (L) 08/24/2017 0908   CL 103 08/21/2017 1415   CL 105 01/18/2013 0912   CO2 22 08/24/2017 0908   BUN 10.4 08/24/2017 0908   CREATININE 0.8 08/24/2017 0908      Component Value Date/Time   CALCIUM 9.4 08/24/2017 0908   ALKPHOS 51 08/24/2017 0908   AST 16 08/24/2017 0908   ALT 16 08/24/2017 0908   BILITOT 0.62 08/24/2017 0908       ASSESSMENT & PLAN:  CLL (chronic lymphocytic leukemia) ([facility]) She has significant, recurrence of lymphocytosis I suspect it is due to interruption of recent treatment Due to high risk of allergic reaction, I am not enthusiastic to retry rituximab unless tumor disease burden is low I recommend we proceed with bendamustine only I plan to repeat staging PET CT scan in 3 weeks to exclude progression of disease If she have minimum response, I might have to switch her either to FCR versus Venetoclax   PAF (paroxysmal atrial fibrillation) ([place]) She has recent successful cardioversion  She would continue medical management as directed  Nonischemic cardiomyopathy ([place]) She has no signs or symptoms of congestive heart failure Continue medical management   Orders Placed This Encounter  Procedures  . NM PET Image Restag (PS) Skull Base To Thigh    Standing Status:   Future    Standing Expiration Date:   08/24/2018    Order Specific Question:   If indicated for the ordered procedure, I authorize the administration of a radiopharmaceutical per Radiology protocol    Answer:   Yes    Order Specific Question:   Preferred imaging location?    Answer:   [facility]    Order Specific Question:   Radiology Contrast Protocol - do NOT remove file path    Answer:   [email or web address]    Order Specific Question:   Is the patient pregnant?    Answer:   No   All questions were answered. The patient knows to call the clinic with any problems, questions or concerns. No barriers to learning was detected. I spent 25 minutes counseling the patient face to face. The total time spent in the appointment was 30 minutes and more than 50% was on counseling and review of test results     Heath Lark, MD 08/24/2017 2:46 PM

## 2017-08-24 NOTE — Assessment & Plan Note (Signed)
She has significant, recurrence of lymphocytosis I suspect it is due to interruption of recent treatment Due to high risk of allergic reaction, I am not enthusiastic to retry rituximab unless tumor disease burden is low I recommend we proceed with bendamustine only I plan to repeat staging PET CT scan in 3 weeks to exclude progression of disease If she have minimum response, I might have to switch her either to FCR versus Venetoclax

## 2017-08-24 NOTE — Patient Instructions (Signed)
[facility] Discharge Instructions for Patients Receiving Chemotherapy  Today you received the following chemotherapy agents Bendeka.  To help prevent nausea and vomiting after your treatment, we encourage you to take your nausea medication as directed.   If you develop nausea and vomiting that is not controlled by your nausea medication, call the clinic.   BELOW ARE SYMPTOMS THAT SHOULD BE REPORTED IMMEDIATELY:  *FEVER GREATER THAN 100.5 F  *CHILLS WITH OR WITHOUT FEVER  NAUSEA AND VOMITING THAT IS NOT CONTROLLED WITH YOUR NAUSEA MEDICATION  *UNUSUAL SHORTNESS OF BREATH  *UNUSUAL BRUISING OR BLEEDING  TENDERNESS IN MOUTH AND THROAT WITH OR WITHOUT PRESENCE OF ULCERS  *URINARY PROBLEMS  *BOWEL PROBLEMS  UNUSUAL RASH Items with * indicate a potential emergency and should be followed up as soon as possible.  Feel free to call the clinic should you have any questions or concerns. The clinic phone number is [phone].  Please show the CHEMO ALERT CARD at check-in to the Emergency Department and triage nurse.   

## 2017-08-24 NOTE — Assessment & Plan Note (Signed)
She has no signs or symptoms of congestive heart failure Continue medical management

## 2017-08-24 NOTE — Progress Notes (Signed)
Per. Dr. Alvy Bimler, okay to treat with WBC of 119.

## 2017-08-25 ENCOUNTER — Ambulatory Visit (HOSPITAL_BASED_OUTPATIENT_CLINIC_OR_DEPARTMENT_OTHER): Payer: BLUE CROSS/BLUE SHIELD

## 2017-08-25 VITALS — BP 113/68 | HR 85 | Temp 99.1°F | Resp 20

## 2017-08-25 DIAGNOSIS — Z5111 Encounter for antineoplastic chemotherapy: Secondary | ICD-10-CM | POA: Diagnosis not present

## 2017-08-25 DIAGNOSIS — C9110 Chronic lymphocytic leukemia of B-cell type not having achieved remission: Secondary | ICD-10-CM | POA: Diagnosis not present

## 2017-08-25 MED ORDER — DEXAMETHASONE SODIUM PHOSPHATE 10 MG/ML IJ SOLN
10.0000 mg | Freq: Once | INTRAMUSCULAR | Status: AC
  Administered 2017-08-25: 10 mg via INTRAVENOUS

## 2017-08-25 MED ORDER — DEXAMETHASONE SODIUM PHOSPHATE 10 MG/ML IJ SOLN
INTRAMUSCULAR | Status: AC
  Filled 2017-08-25: qty 1

## 2017-08-25 MED ORDER — SODIUM CHLORIDE 0.9 % IV SOLN
90.0000 mg/m2 | Freq: Once | INTRAVENOUS | Status: AC
  Administered 2017-08-25: 175 mg via INTRAVENOUS
  Filled 2017-08-25: qty 7

## 2017-08-25 NOTE — Patient Instructions (Addendum)
Bendamustine Injection What is this medicine? BENDAMUSTINE (BEN da MUS teen) is a chemotherapy drug. It is used to treat chronic lymphocytic leukemia and non-Hodgkin lymphoma. This medicine may be used for other purposes; ask your health care provider or pharmacist if you have questions. COMMON BRAND NAME(S): BENDEKA, Treanda What should I tell my health care provider before I take this medicine? They need to know if you have any of these conditions: -infection (especially a virus infection such as chickenpox, cold sores, or herpes) -kidney disease -liver disease -an unusual or allergic reaction to bendamustine, mannitol, other medicines, foods, dyes, or preservatives -pregnant or trying to get pregnant -breast-feeding How should I use this medicine? This medicine is for infusion into a vein. It is given by a health care professional in a hospital or clinic setting. Talk to your pediatrician regarding the use of this medicine in children. Special care may be needed. Overdosage: If you think you have taken too much of this medicine contact a poison control center or emergency room at once. NOTE: This medicine is only for you. Do not share this medicine with others. What if I miss a dose? It is important not to miss your dose. Call your doctor or health care professional if you are unable to keep an appointment. What may interact with this medicine? Do not take this medicine with any of the following medications: -clozapine This medicine may also interact with the following medications: -atazanavir -cimetidine -ciprofloxacin -enoxacin -fluvoxamine -medicines for seizures like carbamazepine and phenobarbital -mexiletine -rifampin -tacrine -thiabendazole -zileuton This list may not describe all possible interactions. Give your health care provider a list of all the medicines, herbs, non-prescription drugs, or dietary supplements you use. Also tell them if you smoke, drink alcohol, or  use illegal drugs. Some items may interact with your medicine. What should I watch for while using this medicine? This drug may make you feel generally unwell. This is not uncommon, as chemotherapy can affect healthy cells as well as cancer cells. Report any side effects. Continue your course of treatment even though you feel ill unless your doctor tells you to stop. You may need blood work done while you are taking this medicine. Call your doctor or health care professional for advice if you get a fever, chills or sore throat, or other symptoms of a cold or flu. Do not treat yourself. This drug decreases your body's ability to fight infections. Try to avoid being around people who are sick. This medicine may increase your risk to bruise or bleed. Call your doctor or health care professional if you notice any unusual bleeding. Talk to your doctor about your risk of cancer. You may be more at risk for certain types of cancers if you take this medicine. Do not become pregnant while taking this medicine or for 3 months after stopping it. Women should inform their doctor if they wish to become pregnant or think they might be pregnant. Men should not father a child while taking this medicine and for 3 months after stopping it.There is a potential for serious side effects to an unborn child. Talk to your health care professional or pharmacist for more information. Do not breast-feed an infant while taking this medicine. This medicine may interfere with the ability to have a child. You should talk with your doctor or health care professional if you are concerned about your fertility. What side effects may I notice from receiving this medicine? Side effects that you should report to your doctor   or health care professional as soon as possible: -allergic reactions like skin rash, itching or hives, swelling of the face, lips, or tongue -low blood counts - this medicine may decrease the number of white blood cells,  red blood cells and platelets. You may be at increased risk for infections and bleeding. -redness, blistering, peeling or loosening of the skin, including inside the mouth -signs of infection - fever or chills, cough, sore throat, pain or difficulty passing urine -signs of decreased platelets or bleeding - bruising, pinpoint red spots on the skin, black, tarry stools, blood in the urine -signs of decreased red blood cells - unusually weak or tired, fainting spells, lightheadedness -signs and symptoms of kidney injury like trouble passing urine or change in the amount of urine -signs and symptoms of liver injury like dark yellow or brown urine; general ill feeling or flu-like symptoms; light-colored stools; loss of appetite; nausea; right upper belly pain; unusually weak or tired; yellowing of the eyes or skin Side effects that usually do not require medical attention (report to your doctor or health care professional if they continue or are bothersome): -constipation -decreased appetite -diarrhea -headache -mouth sores -nausea/vomiting -tiredness This list may not describe all possible side effects. Call your doctor for medical advice about side effects. You may report side effects to FDA at 1-800-FDA-1088. Where should I keep my medicine? This drug is given in a hospital or clinic and will not be stored at home. NOTE: This sheet is a summary. It may not cover all possible information. If you have questions about this medicine, talk to your doctor, pharmacist, or health care provider.  2018 Elsevier/Gold Standard (2015-07-26 08:45:41) [facility] Discharge Instructions for Patients Receiving Chemotherapy  Today you received the following chemotherapy agents Bendamustine  To help prevent nausea and vomiting after your treatment, we encourage you to take your nausea medicationONC AP  As prescribed } If you develop nausea and vomiting that is not controlled by your nausea  medication, call the clinic.   BELOW ARE SYMPTOMS THAT SHOULD BE REPORTED IMMEDIATELY:  *FEVER GREATER THAN 100.5 F  *CHILLS WITH OR WITHOUT FEVER  NAUSEA AND VOMITING THAT IS NOT CONTROLLED WITH YOUR NAUSEA MEDICATION  *UNUSUAL SHORTNESS OF BREATH  *UNUSUAL BRUISING OR BLEEDING  TENDERNESS IN MOUTH AND THROAT WITH OR WITHOUT PRESENCE OF ULCERS  *URINARY PROBLEMS  *BOWEL PROBLEMS  UNUSUAL RASH Items with * indicate a potential emergency and should be followed up as soon as possible.  Feel free to call the clinic should you have any questions or concerns. The clinic phone number is [phone].  Please show the [place] at check-in to the Emergency Department and triage nurse.

## 2017-08-26 ENCOUNTER — Other Ambulatory Visit: Payer: Self-pay | Admitting: Hematology and Oncology

## 2017-08-27 ENCOUNTER — Other Ambulatory Visit: Payer: Self-pay | Admitting: Cardiovascular Disease

## 2017-08-30 ENCOUNTER — Ambulatory Visit (HOSPITAL_BASED_OUTPATIENT_CLINIC_OR_DEPARTMENT_OTHER): Payer: BLUE CROSS/BLUE SHIELD | Attending: Cardiology | Admitting: Cardiology

## 2017-08-30 VITALS — Ht 67.0 in | Wt 193.0 lb

## 2017-08-30 DIAGNOSIS — R400 Somnolence: Secondary | ICD-10-CM | POA: Insufficient documentation

## 2017-08-30 DIAGNOSIS — G4733 Obstructive sleep apnea (adult) (pediatric): Secondary | ICD-10-CM | POA: Insufficient documentation

## 2017-08-30 DIAGNOSIS — R0683 Snoring: Secondary | ICD-10-CM | POA: Diagnosis not present

## 2017-09-18 ENCOUNTER — Ambulatory Visit (HOSPITAL_COMMUNITY)
Admission: RE | Admit: 2017-09-18 | Discharge: 2017-09-18 | Disposition: A | Discharge status: Home / Self Care | Payer: BLUE CROSS/BLUE SHIELD | Source: Ambulatory Visit | Attending: Hematology and Oncology | Admitting: Hematology and Oncology

## 2017-09-18 DIAGNOSIS — R590 Localized enlarged lymph nodes: Secondary | ICD-10-CM | POA: Insufficient documentation

## 2017-09-18 DIAGNOSIS — C9190 Lymphoid leukemia, unspecified not having achieved remission: Secondary | ICD-10-CM | POA: Diagnosis not present

## 2017-09-18 DIAGNOSIS — Z7901 Long term (current) use of anticoagulants: Secondary | ICD-10-CM | POA: Diagnosis not present

## 2017-09-18 DIAGNOSIS — I700 Atherosclerosis of aorta: Secondary | ICD-10-CM | POA: Diagnosis not present

## 2017-09-18 DIAGNOSIS — C9110 Chronic lymphocytic leukemia of B-cell type not having achieved remission: Secondary | ICD-10-CM

## 2017-09-18 DIAGNOSIS — Z79899 Other long term (current) drug therapy: Secondary | ICD-10-CM | POA: Diagnosis not present

## 2017-09-18 DIAGNOSIS — Z7952 Long term (current) use of systemic steroids: Secondary | ICD-10-CM | POA: Diagnosis not present

## 2017-09-18 DIAGNOSIS — R918 Other nonspecific abnormal finding of lung field: Secondary | ICD-10-CM | POA: Diagnosis not present

## 2017-09-18 DIAGNOSIS — I2510 Atherosclerotic heart disease of native coronary artery without angina pectoris: Secondary | ICD-10-CM | POA: Diagnosis not present

## 2017-09-18 LAB — GLUCOSE, CAPILLARY: GLUCOSE-CAPILLARY: 97 mg/dL (ref 65–99)

## 2017-09-18 MED ORDER — FLUDEOXYGLUCOSE F - 18 (FDG) INJECTION
9.3000 | Freq: Once | INTRAVENOUS | Status: AC | PRN
  Administered 2017-09-18: 9.3 via INTRAVENOUS

## 2017-09-21 ENCOUNTER — Telehealth: Payer: Self-pay | Admitting: Pharmacist

## 2017-09-21 ENCOUNTER — Encounter: Payer: Self-pay | Admitting: Hematology and Oncology

## 2017-09-21 ENCOUNTER — Ambulatory Visit: Payer: Self-pay

## 2017-09-21 ENCOUNTER — Other Ambulatory Visit (HOSPITAL_BASED_OUTPATIENT_CLINIC_OR_DEPARTMENT_OTHER): Payer: BLUE CROSS/BLUE SHIELD

## 2017-09-21 ENCOUNTER — Telehealth: Payer: Self-pay | Admitting: Pharmacy Technician

## 2017-09-21 ENCOUNTER — Other Ambulatory Visit: Payer: Self-pay | Admitting: Nurse Practitioner

## 2017-09-21 ENCOUNTER — Ambulatory Visit (HOSPITAL_BASED_OUTPATIENT_CLINIC_OR_DEPARTMENT_OTHER): Payer: BLUE CROSS/BLUE SHIELD | Admitting: Hematology and Oncology

## 2017-09-21 DIAGNOSIS — E790 Hyperuricemia without signs of inflammatory arthritis and tophaceous disease: Secondary | ICD-10-CM

## 2017-09-21 DIAGNOSIS — C9110 Chronic lymphocytic leukemia of B-cell type not having achieved remission: Secondary | ICD-10-CM

## 2017-09-21 DIAGNOSIS — I480 Paroxysmal atrial fibrillation: Secondary | ICD-10-CM | POA: Diagnosis not present

## 2017-09-21 DIAGNOSIS — I428 Other cardiomyopathies: Secondary | ICD-10-CM

## 2017-09-21 LAB — CBC WITH DIFFERENTIAL/PLATELET
BASO%: 0.5 % (ref 0.0–2.0)
BASOS ABS: 0.7 10*3/uL — AB (ref 0.0–0.1)
EOS ABS: 0.2 10*3/uL (ref 0.0–0.5)
EOS%: 0.1 % (ref 0.0–7.0)
HCT: 39.8 % (ref 34.8–46.6)
HEMOGLOBIN: 12.8 g/dL (ref 11.6–15.9)
MCH: 27.6 pg (ref 25.1–34.0)
MCHC: 32.2 g/dL (ref 31.5–36.0)
MCV: 85.8 fL (ref 79.5–101.0)
Platelets: 235 10*3/uL (ref 145–400)
RBC: 4.64 10*6/uL (ref 3.70–5.45)
RDW: 17.7 % — ABNORMAL HIGH (ref 11.2–14.5)
WBC: 144.2 10*3/uL — AB (ref 3.9–10.3)
nRBC: 0 % (ref 0–0)

## 2017-09-21 LAB — COMPREHENSIVE METABOLIC PANEL
ALBUMIN: 3.5 g/dL (ref 3.5–5.0)
ALK PHOS: 65 U/L (ref 40–150)
ALT: 11 U/L (ref 0–55)
ANION GAP: 15 meq/L — AB (ref 3–11)
AST: 19 U/L (ref 5–34)
BILIRUBIN TOTAL: 0.92 mg/dL (ref 0.20–1.20)
BUN: 21.1 mg/dL (ref 7.0–26.0)
CALCIUM: 10.1 mg/dL (ref 8.4–10.4)
CO2: 16 mEq/L — ABNORMAL LOW (ref 22–29)
Chloride: 109 mEq/L (ref 98–109)
Creatinine: 1.1 mg/dL (ref 0.6–1.1)
GLUCOSE: 141 mg/dL — AB (ref 70–140)
POTASSIUM: 3.2 meq/L — AB (ref 3.5–5.1)
SODIUM: 140 meq/L (ref 136–145)
TOTAL PROTEIN: 6.5 g/dL (ref 6.4–8.3)

## 2017-09-21 LAB — URIC ACID: Uric Acid, Serum: 4.5 mg/dl (ref 2.6–7.4)

## 2017-09-21 LAB — TECHNOLOGIST REVIEW

## 2017-09-21 NOTE — Progress Notes (Signed)
[facility] OFFICE PROGRESS NOTE  Patient Care Team: Janith Lima, MD as PCP - General (Internal Medicine)  SUMMARY OF ONCOLOGIC HISTORY:   CLL (chronic lymphocytic leukemia) ([place])   04/05/2015 Pathology Results    Accession: [id number] flow cytometry confirmed CLL. FISH was positive for p53 mutation      04/24/2015 Imaging    Extensive lymphadenopathy throughout the neck, chest (axilla), abdomen and pelvis, as detailed above, compatible with the reported clinical history of lymphoma. 2. Mild splenomegaly.      05/03/2015 - 08/27/2015 Chemotherapy    She started on Ibrutinib, discontinued prematurely when her prescription ran out      10/10/2015 - 05/29/2017 Chemotherapy    She was restarted back on Ibrutinib      11/13/2016 PET scan    Significant generalized reduction in size of numerous lymph nodes in the neck, chest, abdomen, and pelvis. Previously the activity of these nodes was low-level and in general a similar low-level activity is present today, significantly less than mediastinal blood pool activity, compatible with Deauville 2. 2. Coronary atherosclerosis. Mild cardiomegaly. 3. Mildly prominent endometrium for age without accentuated metabolic activity in the endometrium. Consider pelvic sonography for further characterization.      05/27/2017 PET scan    1. Progressive hypermetabolic adenopathy, primarily involving cervical, axillary, pelvic and inguinal lymph nodes bilaterally, consistent with progressive lymphoma. 2. No solid visceral organ or osseous involvement.      06/16/2017 -  Chemotherapy    The patient had palonosetron (ALOXI) injection 0.25 mg, 0.25 mg, Intravenous,  Once, 1 of 4 cycles  riTUXimab (RITUXAN) 200 mg in sodium chloride 0.9 % 250 mL (0.7407 mg/mL) chemo infusion, 100 mg/m2 = 200 mg (100 % of original dose 100 mg/m2), Intravenous,  Once, 1 of 4 cycles Dose modification: 100 mg/m2 (original dose 100 mg/m2, Cycle 1, Reason: Provider  Judgment, Comment: anticipate high risk infusion)  bendamustine (BENDEKA) 175 mg in sodium chloride 0.9 % 50 mL (3.0702 mg/mL) chemo infusion, 90 mg/m2 = 175 mg, Intravenous,  Once, 1 of 4 cycles  for chemotherapy treatment.  She received Rituximab and Bendamustine      07/15/2017 - 07/19/2017 Hospital Admission    The patient was briefly admitted to the hospital due to infusion reaction to rituximab      09/18/2017 PET scan    1. Continued considerable adenopathy in the neck, chest, abdomen, and pelvis. This is generally stable in size but mildly reduced in activity compared to the prior exam. Current levels of activity primarily Deauville 3 and Deauville 4. No splenomegaly. 2. Diffuse new ground-glass opacities in the lungs with associated hypermetabolic activity. Some forms of lymphoma infiltration can rarely cause this pattern of diffuse ground-glass opacity and hypermetabolic activity. Differential diagnostic considerations might include atypical pneumonia such as mycoplasma, acute hypersensitivity pneumonitis, or acute eosinophilic pneumonia. Pulmonary hemorrhage seems less likely to cause this degree of accentuated metabolic activity.  3. Aortic Atherosclerosis (ICD10-I70.0). Coronary atherosclerosis.       INTERVAL HISTORY: Please see below for problem oriented charting. She returns for further follow-up She complained of shortness of breath on moderate exertion She denies recent cough No new lymphadenopathy. The patient denies any recent signs or symptoms of bleeding such as spontaneous epistaxis, hematuria or hematochezia.  REVIEW OF SYSTEMS:   Constitutional: Denies fevers, chills or abnormal weight loss Eyes: Denies blurriness of vision Ears, nose, mouth, throat, and face: Denies mucositis or sore throat Cardiovascular: Denies palpitation, chest discomfort or lower extremity  swelling Gastrointestinal:  Denies nausea, heartburn or change in bowel habits Skin: Denies  abnormal skin rashes Lymphatics: Denies new lymphadenopathy or easy bruising Neurological:Denies numbness, tingling or new weaknesses Behavioral/Psych: Mood is stable, no new changes  All other systems were reviewed with the patient and are negative.  I have reviewed the past medical history, past surgical history, social history and family history with the patient and they are unchanged from previous note.  ALLERGIES:  has No Known Allergies.  MEDICATIONS:  Current Outpatient Medications  Medication Sig Dispense Refill  . acyclovir (ZOVIRAX) 400 MG tablet Take 1 tablet (400 mg total) by mouth daily. 30 tablet 3  . allopurinol (ZYLOPRIM) 300 MG tablet Take 1 tablet (300 mg total) by mouth daily. 30 tablet 3  . diltiazem (CARDIZEM CD) 240 MG 24 hr capsule Take 1 capsule (240 mg total) daily by mouth. 30 capsule 5  . diltiazem 2 % GEL Apply 1 application topically 3 (three) times daily. Use for 6 weeks (Patient taking differently: Apply 1 application 2 (two) times daily topically. Use for 6 weeks) 30 g 1  . ELIQUIS 5 MG TABS tablet TAKE 1 TABLET BY MOUTH TWICE A DAY 180 tablet 1  . furosemide (LASIX) 40 MG tablet TAKE 1 TABLET BY MOUTH EVERY DAY 90 tablet 3  . metoprolol tartrate (LOPRESSOR) 25 MG tablet Take 1.5 tablets (37.5 mg total) by mouth 2 (two) times daily. 90 tablet 3  . ondansetron (ZOFRAN) 8 MG tablet Take 1 tablet (8 mg total) by mouth every 8 (eight) hours as needed for refractory nausea / vomiting. 30 tablet 1  . predniSONE (DELTASONE) 10 MG tablet TAKE 1 TABLET BY MOUTH DAILY WITH BREAKFAST 30 tablet 0  . prochlorperazine (COMPAZINE) 10 MG tablet Take 1 tablet (10 mg total) by mouth every 6 (six) hours as needed (Nausea or vomiting). 30 tablet 1  . sacubitril-valsartan (ENTRESTO) 24-26 MG Take 1 tablet by mouth 2 (two) times daily. 60 tablet 3  . spironolactone (ALDACTONE) 25 MG tablet Take 0.5 tablets (12.5 mg total) by mouth daily. 30 tablet 3  . Vitamin D, Cholecalciferol,  1000 units CAPS Take 1,000 Units by mouth every morning.      No current facility-administered medications for this visit.     PHYSICAL EXAMINATION: ECOG PERFORMANCE STATUS: 1 - Symptomatic but completely ambulatory  Vitals:   09/21/17 1011  BP: 113/73  Pulse: 96  Resp: (!) 21  Temp: 99.2 F (37.3 C)  SpO2: 95%   Filed Weights   09/21/17 1011  Weight: 183 lb 8 oz (83.2 kg)    GENERAL:alert, no distress and comfortable SKIN: skin color, texture, turgor are normal, no rashes or significant lesions EYES: normal, Conjunctiva are pink and non-injected, sclera clear OROPHARYNX:no exudate, no erythema and lips, buccal mucosa, and tongue normal  NECK: supple, thyroid normal size, non-tender, without nodularity LYMPH: She has palpable lymphadenopathy in her neck LUNGS: clear to auscultation and percussion with normal breathing effort HEART: irregular rate & rhythm and no murmurs and no lower extremity edema ABDOMEN:abdomen soft, non-tender and normal bowel sounds Musculoskeletal:no cyanosis of digits and no clubbing  NEURO: alert & oriented x 3 with fluent speech, no focal motor/sensory deficits  LABORATORY DATA:  I have reviewed the data as listed    Component Value Date/Time   NA 140 09/21/2017 0950   K 3.2 (L) 09/21/2017 0950   CL 103 08/21/2017 1415   CL 105 01/18/2013 0912   CO2 16 (L) 09/21/2017 0950  GLUCOSE 141 (H) 09/21/2017 0950   GLUCOSE 83 01/18/2013 0912   BUN 21.1 09/21/2017 0950   CREATININE 1.1 09/21/2017 0950   CALCIUM 10.1 09/21/2017 0950   PROT 6.5 09/21/2017 0950   ALBUMIN 3.5 09/21/2017 0950   AST 19 09/21/2017 0950   ALT 11 09/21/2017 0950   ALKPHOS 65 09/21/2017 0950   BILITOT 0.92 09/21/2017 0950   GFRNONAA 88 08/21/2017 1415   GFRAA 101 08/21/2017 1415    No results found for: SPEP, UPEP  Lab Results  Component Value Date   WBC 144.2 (HH) 09/21/2017   NEUTROABS 7.1 (H) 08/24/2017   HGB 12.8 09/21/2017   HCT 39.8 09/21/2017   MCV 85.8  09/21/2017   PLT 235 09/21/2017      Chemistry      Component Value Date/Time   NA 140 09/21/2017 0950   K 3.2 (L) 09/21/2017 0950   CL 103 08/21/2017 1415   CL 105 01/18/2013 0912   CO2 16 (L) 09/21/2017 0950   BUN 21.1 09/21/2017 0950   CREATININE 1.1 09/21/2017 0950      Component Value Date/Time   CALCIUM 10.1 09/21/2017 0950   ALKPHOS 65 09/21/2017 0950   AST 19 09/21/2017 0950   ALT 11 09/21/2017 0950   BILITOT 0.92 09/21/2017 0950       RADIOGRAPHIC STUDIES: I have personally reviewed the radiological images as listed and agreed with the findings in the report. Nm Pet Image Restag (ps) Skull Base To Thigh  Result Date: 09/18/2017 CLINICAL DATA:  Subsequent treatment strategy for chronic lymphocytic leukemia. EXAM: NUCLEAR MEDICINE PET SKULL BASE TO THIGH TECHNIQUE: 9.3 mCi F-18 FDG was injected intravenously. Full-ring PET imaging was performed from the skull base to thigh after the radiotracer. CT data was obtained and used for attenuation correction and anatomic localization. FASTING BLOOD GLUCOSE:  Value: 97 mg/dl COMPARISON:  Multiple exams, including 05/27/2017 FINDINGS: NECK Bilateral level IIa, level II B, level V, level III, and level IV lymph nodes are present as on the prior exam. An index right level II B lymph node measures 1.3 cm in short axis on image 33/4 (formerly 1.4 cm) and has a maximum SUV of 3.3 (formerly 4.7). CHEST Bilateral hypermetabolic axillary, paratracheal, AP window, supraclavicular, hilar, and infrahilar adenopathy observed. Index left axillary node 1.9 cm in short axis on image 54/4 with maximum SUV 3.0 (formerly 1.8 cm by my measurement with maximum SUV 3.9). There are patchy bilateral ground-glass opacities throughout the lungs with associated accentuated metabolic activity, new from the prior exam. Accentuated activity in the right posterior hemithorax with maximum SUV of 5.8. Some of the areas are of hypermetabolic activity are more confluent  and in the right upper lobe an area of confluent metabolic activity has maximum SUV of 8.9 but with only indistinct ground-glass opacity on the lung window images. Thyroid goiter without hypermetabolic activity. Coronary artery atherosclerosis with mild cardiomegaly. Background blood pool activity 2.3 ABDOMEN/PELVIS Pathologic right gastric, peripancreatic, central mesenteric, retroperitoneal/periaortic, external iliac, common iliac, inguinal, and obturator adenopathy noted. A representative index right obturator lymph node measures 1.5 cm in short axis on image 166/4 and has maximum SUV of 5.0 (formerly 1.5 cm in diameter with maximum SUV 4.3). The left obturator node measures 1.6 cm in short axis on image 167/4 (formerly 1.8 cm) and has maximum SUV of 3.5 (formerly 4.5). The spleen measures 10.3 by 4.6 by 11.7 cm (volume = 290 cm^3). No focal splenic abnormal activity identified. Aortoiliac atherosclerotic vascular disease. Small  indirect inguinal hernias contain adipose tissue. Background hepatic activity 4.1. SKELETON No focal hypermetabolic activity to suggest skeletal metastasis. IMPRESSION: 1. Continued considerable adenopathy in the neck, chest, abdomen, and pelvis. This is generally stable in size but mildly reduced in activity compared to the prior exam. Current levels of activity primarily Deauville 3 and Deauville 4. No splenomegaly. 2. Diffuse new ground-glass opacities in the lungs with associated hypermetabolic activity. Some forms of lymphoma infiltration can rarely cause this pattern of diffuse ground-glass opacity and hypermetabolic activity. Differential diagnostic considerations might include atypical pneumonia such as mycoplasma, acute hypersensitivity pneumonitis, or acute eosinophilic pneumonia. Pulmonary hemorrhage seems less likely to cause this degree of accentuated metabolic activity. 3.  Aortic Atherosclerosis (ICD10-I70.0).  Coronary atherosclerosis. Electronically Signed   By: Van Clines M.D.   On: 09/18/2017 10:05    ASSESSMENT & PLAN:  CLL (chronic lymphocytic leukemia) ([place]) Unfortunately, CT scan showed significant disease burden Her CBC continues to progress She is symptomatic with shortness of breath likely due to cancer infiltration of the lungs We discussed the current guidelines. Goals of treatment is palliative I recommend inpatient chemotherapy administration with venetoclax for the first few cycle to monitor for risk of tumor lysis syndrome Given her baseline atrial fibrillation, there might be a risk of heart failure as well and she would need telemetry monitoring We also discussed other options including Alemtuzumab or FCR regimen The preferred choice of treatment in this situation is venetoclax in combination with rituximab  This is an CCN guidelines category #1 recommendation based on the following publication:  Venetoclax-Rituximab in Relapsed or Refractory Chronic Lymphocytic Leukemia J.F. Geroge Baseman, T.J. Kipps, B. Eichhorst, P. Hillmen, J. D'Rozario, S. Assouline, C. Matt Holmes, TLeone Payor, JTennis Must la [place], Jennette Kettle, Alain Marion, M. Montillo, R. Humerickhouse, E.A. Punnoose, Y. Mallie Snooks. Roque Cash, K. Humphrey, M. Mobasher, and A.P. Gilford Rile J Med [phone]. DOI: 10.1056/[id number]  BACKGROUND Venetoclax inhibits BCL2, an antiapoptotic protein that is pathologically overexpressed and that is central to the survival of chronic lymphocytic leukemia cells. We evaluated the efficacy of venetoclax in combination with rituximab in patients with relapsed or refractory chronic lymphocytic leukemia. METHODS In this randomized, open-label, phase 3 trial, we randomly assigned 389 patients to receive venetoclax for up to 2 years (from day 1 of cycle 1) plus rituximab for the first 6 months (venetoclax-rituximab group) or bendamustine plus rituximab for 6 months (bendamustine-rituximab group). The trial design did not include crossover to  venetoclax plus rituximab for patients in the bendamustine-rituximab group in whom progression occurred. The primary end point was investigator-assessed progression-free survival. RESULTS After a median follow-up period of 23.8 months, the rate of investigator-assessed progression-free survival was significantly higher in the venetoclax-rituximab group (32 events of progression or death in 194 patients) than in the bendamustine-rituximab group (114 events in 195 patients); the 2-year rates of progression-free survival were 84.9% and 36.3%, respectively (hazard ratio for progression or death, 0.17; 95% confidence interval [CI], 0.11 to 0.25; P<0.001 by the stratified log-rank test). The benefit was maintained across all clinical and biologic subgroups, including the subgroup of patients with chromosome 17p deletion; the 2-year rate of progression-free survival among patients with chromosome 17p deletion was 81.5% in the venetoclax-rituximab group versus 27.8% in the bendamustine-rituximab group (hazard ratio, 0.13; 95% CI, 0.05 to 0.29), and the 2-year rate among those without chromosome 17p deletion was 85.9% versus 41.0% (hazard ratio, 0.19; 95% CI, 0.12 to 0.32). The benefit of venetoclax plus rituximab over bendamustine  plus rituximab was confirmed by an independent review committee assessment of progression-free survival and other secondary efficacy end points. The rate of grade 3 or 4 neutropenia was higher in the venetoclax-rituximab group than in the bendamustine-rituximab group, but the rates of grade 3 or 4 febrile neutropenia and infections or infestations were lower with venetoclax than with bendamustine. The rate of grade 3 or 4 tumor lysis syndrome in the venetoclax- rituximab group was 3.1% (6 of 194 patients). CONCLUSIONS Among patients with relapsed or refractory chronic lymphocytic leukemia, venetoclax plus rituximab resulted in significantly higher rates of progression-free survival  than bendamustine plus rituximab.  We discussed some of the risks, benefits and side-effects of Venetoclax with Rituximab  Some of the short term side-effects included, though not limited to, risk of fatigue, weight loss, tumor lysis syndrome, risk of allergic reactions, pancytopenia, life-threatening infections, need for transfusions of blood products, nausea, vomiting, change in bowel habits, admission to hospital for various reasons, and risks of death.   Long term side-effects are also discussed including permanent damage to nerve function, chronic fatigue, and rare secondary malignancy including bone marrow disorders.   The patient is aware that the response rates discussed earlier is not guaranteed.    After a long discussion, patient made an informed decision to proceed with the prescribed plan of care.   Patient education material was dispensed    Nonischemic cardiomyopathy ([place]) We will monitor carefully for CHF She will continue medical management for now  PAF (paroxysmal atrial fibrillation) ([place]) The patient is at risks of arrhythmia during treatment She would need telemetry monitoring   No orders of the defined types were placed in this encounter.  All questions were answered. The patient knows to call the clinic with any problems, questions or concerns. No barriers to learning was detected. I spent 40 minutes counseling the patient face to face. The total time spent in the appointment was 55 minutes and more than 50% was on counseling and review of test results     Heath Lark, MD 09/21/2017 4:11 PM

## 2017-09-21 NOTE — Assessment & Plan Note (Addendum)
Unfortunately, CT scan showed significant disease burden Her CBC continues to progress She is symptomatic with shortness of breath likely due to cancer infiltration of the lungs We discussed the current guidelines. Goals of treatment is palliative I recommend inpatient chemotherapy administration with venetoclax for the first few cycle to monitor for risk of tumor lysis syndrome Given her baseline atrial fibrillation, there might be a risk of heart failure as well and she would need telemetry monitoring We also discussed other options including Alemtuzumab or FCR regimen The preferred choice of treatment in this situation is venetoclax in combination with rituximab  This is an CCN guidelines category #1 recommendation based on the following publication:  Venetoclax-Rituximab in Relapsed or Refractory Chronic Lymphocytic Leukemia J.F. Geroge Baseman, T.J. Kipps, B. Eichhorst, P. Hillmen, J. D'Rozario, S. Assouline, C. Matt Holmes, TLeone Payor, JTennis Must la [place], Jennette Kettle, Alain Marion, M. Montillo, R. Humerickhouse, E.A. Punnoose, Y. Mallie Snooks. Roque Cash, K. Humphrey, M. Mobasher, and A.P. Gilford Rile J Med [phone]. DOI: 10.1056/[id number]  BACKGROUND Venetoclax inhibits BCL2, an antiapoptotic protein that is pathologically overexpressed and that is central to the survival of chronic lymphocytic leukemia cells. We evaluated the efficacy of venetoclax in combination with rituximab in patients with relapsed or refractory chronic lymphocytic leukemia. METHODS In this randomized, open-label, phase 3 trial, we randomly assigned 389 patients to receive venetoclax for up to 2 years (from day 1 of cycle 1) plus rituximab for the first 6 months (venetoclax-rituximab group) or bendamustine plus rituximab for 6 months (bendamustine-rituximab group). The trial design did not include crossover to venetoclax plus rituximab for patients in the bendamustine-rituximab group in whom progression occurred. The  primary end point was investigator-assessed progression-free survival. RESULTS After a median follow-up period of 23.8 months, the rate of investigator-assessed progression-free survival was significantly higher in the venetoclax-rituximab group (32 events of progression or death in 194 patients) than in the bendamustine-rituximab group (114 events in 195 patients); the 2-year rates of progression-free survival were 84.9% and 36.3%, respectively (hazard ratio for progression or death, 0.17; 95% confidence interval [CI], 0.11 to 0.25; P<0.001 by the stratified log-rank test). The benefit was maintained across all clinical and biologic subgroups, including the subgroup of patients with chromosome 17p deletion; the 2-year rate of progression-free survival among patients with chromosome 17p deletion was 81.5% in the venetoclax-rituximab group versus 27.8% in the bendamustine-rituximab group (hazard ratio, 0.13; 95% CI, 0.05 to 0.29), and the 2-year rate among those without chromosome 17p deletion was 85.9% versus 41.0% (hazard ratio, 0.19; 95% CI, 0.12 to 0.32). The benefit of venetoclax plus rituximab over bendamustine plus rituximab was confirmed by an independent review committee assessment of progression-free survival and other secondary efficacy end points. The rate of grade 3 or 4 neutropenia was higher in the venetoclax-rituximab group than in the bendamustine-rituximab group, but the rates of grade 3 or 4 febrile neutropenia and infections or infestations were lower with venetoclax than with bendamustine. The rate of grade 3 or 4 tumor lysis syndrome in the venetoclax- rituximab group was 3.1% (6 of 194 patients). CONCLUSIONS Among patients with relapsed or refractory chronic lymphocytic leukemia, venetoclax plus rituximab resulted in significantly higher rates of progression-free survival than bendamustine plus rituximab.  We discussed some of the risks, benefits and side-effects of Venetoclax with  Rituximab  Some of the short term side-effects included, though not limited to, risk of fatigue, weight loss, tumor lysis syndrome, risk of allergic reactions, pancytopenia, life-threatening infections, need for transfusions of blood products,  nausea, vomiting, change in bowel habits, admission to hospital for various reasons, and risks of death.   Long term side-effects are also discussed including permanent damage to nerve function, chronic fatigue, and rare secondary malignancy including bone marrow disorders.   The patient is aware that the response rates discussed earlier is not guaranteed.    After a long discussion, patient made an informed decision to proceed with the prescribed plan of care.   Patient education material was dispensed

## 2017-09-21 NOTE — Telephone Encounter (Signed)
Oral Oncology Pharmacist Encounter  Received new prescription for Venclexta (venetoclax) for the treatment of relapsed/refractory CLL (17p mutation positive, p53 mutation positive), planned duration up to 24 months based on disease response and toleration of therapy.  Venetoclax dose will be titrated up per manufacturer recommendations. Patient with previous reaction to rituximab.  Labs from today (09/21/17) assessed, OK for treatment. Noted SCr=1.1, est CrCl 75 mL/min  TLS risk assessed, high risk per tumor size and ALC>25.  Patient will require inpatient admission for dose initiation of 66m and 582mdoses for hydration and close electrolyte monitoring per TLS risk assessment.  Patient also with multiple comorbidities (atrial fibrillation s/p repeat cardioversion on 08/19/17, CHF with LVEF 30-35% on 06/19/17 on Entresto). QTc on 08/19/17 elevated at 469 msec.  Admission scheduling for initiation will be coordinated by MD, planned as soon as medication can be acquired. Prior authorization has been submitted as urgent, is pending.  Current medication list in Epic reviewed, DDIs with Venclexta identified:  Venclexta and Cardizem CD: category D interaction: diltiazem is an inhibitor of CYP3A4 and will lead to decreased metabolism and Venclexta. Manufacturer recommended Venclexta dose reductions of at least 50% when coadministration with CYP3A4 inhibitors can not be avoided. Dosing of Venclexta will be discussed with MD.  Prescription will be sent to dispensing pharmacy once prior authorization has been approved.  Oral Oncology Clinic will continue to follow for insurance authorization, copayment issues, initial counseling and start date.  JeJohny DrillingPharmD, BCPS, BCOP 09/21/2017 3:49 PM Oral Oncology Clinic [phone]

## 2017-09-21 NOTE — Assessment & Plan Note (Signed)
The patient is at risks of arrhythmia during treatment She would need telemetry monitoring

## 2017-09-21 NOTE — Telephone Encounter (Signed)
Oral Oncology Patient Advocate Encounter  Received notification from [place] that prior authorization for Venclexta is required.  PA submitted on CoverMyMeds Key [id number] Status is pending  Oral Oncology Clinic will continue to follow.  Fabio Asa. Melynda Keller, [place] Patient [place] [phone] 09/21/2017 4:03 PM

## 2017-09-21 NOTE — Procedures (Signed)
    Patient Name: Audrey, Gross Date: 08/30/2017 Gender: Female D.O.B: Oct 04, 1961 Age (years): 24 Referring Provider: Will Camnitz Height (inches): 67 Interpreting Physician: Fransico Him MD, ABSM Weight (lbs): 193 RPSGT: Baxter Flattery BMI: 30 MRN: [id number] Neck Size: 15.50  CLINICAL INFORMATION Sleep Study Type: NPSG  Indication for sleep study: Excessive Daytime Sleepiness, Fatigue, Snoring, Witnessed Apneas  Epworth Sleepiness Score: 13  SLEEP STUDY TECHNIQUE As per the AASM Manual for the Scoring of Sleep and Associated Events v2.3 (April 2016) with a hypopnea requiring 4% desaturations.  The channels recorded and monitored were frontal, central and occipital EEG, electrooculogram (EOG), submentalis EMG (chin), nasal and oral airflow, thoracic and abdominal wall motion, anterior tibialis EMG, snore microphone, electrocardiogram, and pulse oximetry.  MEDICATIONS Medications self-administered by patient taken the night of the study : ELIQUIS, ENTRENTO, METOPROLOL TARTRATE, VITAMIN D  SLEEP ARCHITECTURE The study was initiated at 10:13:50 PM and ended at 4:48:25 AM.  Sleep onset time was 112.5 minutes and the sleep efficiency was 65.4%. The total sleep time was 258.0 minutes.  Stage REM latency was 58.0 minutes.  The patient spent 5.04% of the night in stage N1 sleep, 78.49% in stage N2 sleep, 0.00% in stage N3 and 16.47% in REM.  Alpha intrusion was absent.  Supine sleep was 20.74%.  RESPIRATORY PARAMETERS The overall apnea/hypopnea index (AHI) was 2.6 per hour. There were 2 total apneas, including 0 obstructive, 2 central and 0 mixed apneas. There were 9 hypopneas and 0 RERAs.  The AHI during Stage REM sleep was 9.9 per hour.  AHI while supine was 0.0 per hour.  The mean oxygen saturation was 92.57%. The minimum SpO2 during sleep was 83.00%.  loud snoring was noted during this study.  CARDIAC DATA The 2 lead EKG demonstrated sinus rhythm. The mean  heart rate was 75.36 beats per minute. Other EKG findings include: None.  LEG MOVEMENT DATA The total PLMS were 37 with a resulting PLMS index of 8.60. Associated arousal with leg movement index was 0.0 .  IMPRESSIONS - No significant obstructive sleep apnea occurred during this study (AHI = 2.6/h). - No significant central sleep apnea occurred during this study (CAI = 0.5/h). - Mild oxygen desaturation was noted during this study (Min O2 = 83.00%). - The patient snored with loud snoring volume. - No cardiac abnormalities were noted during this study. - Mild periodic limb movements of sleep occurred during the study. No significant associated arousals.  DIAGNOSIS - Normal study  RECOMMENDATIONS - Avoid alcohol, sedatives and other CNS depressants that may worsen sleep apnea and disrupt normal sleep architecture. - Sleep hygiene should be reviewed to assess factors that may improve sleep quality. - Weight management and regular exercise should be initiated or continued if appropriate.  [place], American Board of Sleep Medicine  ELECTRONICALLY SIGNED ON:  09/21/2017, 9:23 PM [place] PH: [phone]   FX: [phone] [place]

## 2017-09-21 NOTE — Assessment & Plan Note (Signed)
We will monitor carefully for CHF She will continue medical management for now

## 2017-09-22 ENCOUNTER — Ambulatory Visit: Payer: Self-pay

## 2017-09-22 ENCOUNTER — Other Ambulatory Visit (INDEPENDENT_AMBULATORY_CARE_PROVIDER_SITE_OTHER): Payer: BLUE CROSS/BLUE SHIELD

## 2017-09-22 ENCOUNTER — Ambulatory Visit: Payer: BLUE CROSS/BLUE SHIELD | Admitting: Internal Medicine

## 2017-09-22 ENCOUNTER — Inpatient Hospital Stay (HOSPITAL_COMMUNITY)
Admission: EM | Admit: 2017-09-22 | Discharge: 2017-09-28 | DRG: 871 | Disposition: A | Discharge status: Home / Self Care | Payer: BLUE CROSS/BLUE SHIELD | Attending: Internal Medicine | Admitting: Internal Medicine

## 2017-09-22 ENCOUNTER — Other Ambulatory Visit: Payer: Self-pay

## 2017-09-22 ENCOUNTER — Ambulatory Visit (INDEPENDENT_AMBULATORY_CARE_PROVIDER_SITE_OTHER)
Admission: RE | Admit: 2017-09-22 | Discharge: 2017-09-22 | Disposition: A | Discharge status: Home / Self Care | Payer: BLUE CROSS/BLUE SHIELD | Source: Ambulatory Visit | Attending: Internal Medicine | Admitting: Internal Medicine

## 2017-09-22 ENCOUNTER — Emergency Department (HOSPITAL_COMMUNITY): Payer: BLUE CROSS/BLUE SHIELD

## 2017-09-22 ENCOUNTER — Encounter: Payer: Self-pay | Admitting: Internal Medicine

## 2017-09-22 ENCOUNTER — Encounter (HOSPITAL_COMMUNITY): Payer: Self-pay

## 2017-09-22 ENCOUNTER — Other Ambulatory Visit: Payer: Self-pay | Admitting: Hematology and Oncology

## 2017-09-22 ENCOUNTER — Encounter: Payer: Self-pay | Admitting: Hematology and Oncology

## 2017-09-22 ENCOUNTER — Telehealth: Payer: Self-pay | Admitting: Hematology and Oncology

## 2017-09-22 VITALS — BP 122/70 | HR 108 | Temp 98.8°F | Resp 26 | Ht 67.0 in | Wt 185.0 lb

## 2017-09-22 DIAGNOSIS — Z7901 Long term (current) use of anticoagulants: Secondary | ICD-10-CM | POA: Diagnosis not present

## 2017-09-22 DIAGNOSIS — J9601 Acute respiratory failure with hypoxia: Secondary | ICD-10-CM | POA: Diagnosis not present

## 2017-09-22 DIAGNOSIS — I110 Hypertensive heart disease with heart failure: Secondary | ICD-10-CM | POA: Diagnosis present

## 2017-09-22 DIAGNOSIS — Z79899 Other long term (current) drug therapy: Secondary | ICD-10-CM | POA: Diagnosis not present

## 2017-09-22 DIAGNOSIS — I5022 Chronic systolic (congestive) heart failure: Secondary | ICD-10-CM

## 2017-09-22 DIAGNOSIS — I480 Paroxysmal atrial fibrillation: Secondary | ICD-10-CM | POA: Diagnosis present

## 2017-09-22 DIAGNOSIS — I340 Nonrheumatic mitral (valve) insufficiency: Secondary | ICD-10-CM | POA: Diagnosis present

## 2017-09-22 DIAGNOSIS — E785 Hyperlipidemia, unspecified: Secondary | ICD-10-CM | POA: Diagnosis not present

## 2017-09-22 DIAGNOSIS — J189 Pneumonia, unspecified organism: Secondary | ICD-10-CM

## 2017-09-22 DIAGNOSIS — C9190 Lymphoid leukemia, unspecified not having achieved remission: Secondary | ICD-10-CM | POA: Diagnosis not present

## 2017-09-22 DIAGNOSIS — Z7952 Long term (current) use of systemic steroids: Secondary | ICD-10-CM | POA: Diagnosis not present

## 2017-09-22 DIAGNOSIS — J168 Pneumonia due to other specified infectious organisms: Secondary | ICD-10-CM | POA: Diagnosis not present

## 2017-09-22 DIAGNOSIS — R0602 Shortness of breath: Secondary | ICD-10-CM | POA: Diagnosis present

## 2017-09-22 DIAGNOSIS — I700 Atherosclerosis of aorta: Secondary | ICD-10-CM | POA: Diagnosis not present

## 2017-09-22 DIAGNOSIS — I428 Other cardiomyopathies: Secondary | ICD-10-CM | POA: Diagnosis not present

## 2017-09-22 DIAGNOSIS — I10 Essential (primary) hypertension: Secondary | ICD-10-CM | POA: Diagnosis present

## 2017-09-22 DIAGNOSIS — C9110 Chronic lymphocytic leukemia of B-cell type not having achieved remission: Secondary | ICD-10-CM

## 2017-09-22 DIAGNOSIS — A419 Sepsis, unspecified organism: Principal | ICD-10-CM | POA: Diagnosis present

## 2017-09-22 DIAGNOSIS — R05 Cough: Secondary | ICD-10-CM | POA: Diagnosis not present

## 2017-09-22 DIAGNOSIS — C9111 Chronic lymphocytic leukemia of B-cell type in remission: Secondary | ICD-10-CM | POA: Diagnosis not present

## 2017-09-22 DIAGNOSIS — D72820 Lymphocytosis (symptomatic): Secondary | ICD-10-CM | POA: Diagnosis not present

## 2017-09-22 DIAGNOSIS — J9600 Acute respiratory failure, unspecified whether with hypoxia or hypercapnia: Secondary | ICD-10-CM | POA: Diagnosis present

## 2017-09-22 DIAGNOSIS — I482 Chronic atrial fibrillation: Secondary | ICD-10-CM | POA: Diagnosis not present

## 2017-09-22 DIAGNOSIS — R509 Fever, unspecified: Secondary | ICD-10-CM | POA: Diagnosis not present

## 2017-09-22 LAB — I-STAT CG4 LACTIC ACID, ED
LACTIC ACID, VENOUS: 1.96 mmol/L — AB (ref 0.5–1.9)
LACTIC ACID, VENOUS: 1.57 mmol/L (ref 0.5–1.9)

## 2017-09-22 LAB — URINALYSIS, ROUTINE W REFLEX MICROSCOPIC
Bilirubin Urine: NEGATIVE
Glucose, UA: NEGATIVE mg/dL
Ketones, ur: NEGATIVE mg/dL
Nitrite: NEGATIVE
PROTEIN: NEGATIVE mg/dL
SPECIFIC GRAVITY, URINE: 1.024 (ref 1.005–1.030)
pH: 5 (ref 5.0–8.0)

## 2017-09-22 LAB — COMPREHENSIVE METABOLIC PANEL
ALBUMIN: 3.3 g/dL — AB (ref 3.5–5.0)
ALT: 13 U/L — AB (ref 14–54)
AST: 37 U/L (ref 15–41)
Alkaline Phosphatase: 57 U/L (ref 38–126)
Anion gap: 7 (ref 5–15)
BUN: 15 mg/dL (ref 6–20)
CHLORIDE: 107 mmol/L (ref 101–111)
CO2: 23 mmol/L (ref 22–32)
CREATININE: 1.07 mg/dL — AB (ref 0.44–1.00)
Calcium: 9.7 mg/dL (ref 8.9–10.3)
GFR calc Af Amer: >60 mL/min (ref >60)
GFR, EST NON AFRICAN AMERICAN: 57 mL/min — AB (ref >60)
GLUCOSE: 100 mg/dL — AB (ref 65–99)
Potassium: 4.9 mmol/L (ref 3.5–5.1)
SODIUM: 137 mmol/L (ref 135–145)
Total Bilirubin: 0.8 mg/dL (ref 0.3–1.2)
Total Protein: 5.8 g/dL — ABNORMAL LOW (ref 6.5–8.1)

## 2017-09-22 LAB — THYROID PANEL WITH TSH
FREE THYROXINE INDEX: 3.8 (ref 1.4–3.8)
T3 UPTAKE: 33 % (ref 22–35)
T4 TOTAL: 11.4 ug/dL (ref 5.1–11.9)
TSH: 0.05 m[IU]/L — AB (ref 0.40–4.50)

## 2017-09-22 LAB — CBC WITH DIFFERENTIAL/PLATELET
BASOS ABS: 0 10*3/uL (ref 0.0–0.1)
Basophils Relative: 0 %
EOS PCT: 1 %
Eosinophils Absolute: 1.4 10*3/uL — ABNORMAL HIGH (ref 0.0–0.7)
HCT: 36.4 % (ref 36.0–46.0)
Hemoglobin: 11.8 g/dL — ABNORMAL LOW (ref 12.0–15.0)
Lymphocytes Relative: 94 %
Lymphs Abs: 132.2 10*3/uL — ABNORMAL HIGH (ref 0.7–4.0)
MCH: 28.2 pg (ref 26.0–34.0)
MCHC: 32.4 g/dL (ref 30.0–36.0)
MCV: 86.9 fL (ref 78.0–100.0)
MONO ABS: 0 10*3/uL — AB (ref 0.1–1.0)
MONOS PCT: 0 %
NEUTROS PCT: 5 %
Neutro Abs: 7 10*3/uL (ref 1.7–7.7)
PLATELETS: 278 10*3/uL (ref 150–400)
RBC: 4.19 MIL/uL (ref 3.87–5.11)
RDW: 18 % — ABNORMAL HIGH (ref 11.5–15.5)
WBC: 140.6 10*3/uL — AB (ref 4.0–10.5)

## 2017-09-22 LAB — BASIC METABOLIC PANEL
BUN: 19 mg/dL (ref 6–23)
CHLORIDE: 107 meq/L (ref 96–112)
CO2: 24 mEq/L (ref 19–32)
Calcium: 9.5 mg/dL (ref 8.4–10.5)
Creatinine, Ser: 1.05 mg/dL (ref 0.40–1.20)
GFR: 69.51 mL/min (ref >60.00)
Glucose, Bld: 102 mg/dL — ABNORMAL HIGH (ref 70–99)
POTASSIUM: 3.6 meq/L (ref 3.5–5.1)
Sodium: 142 mEq/L (ref 135–145)

## 2017-09-22 LAB — I-STAT TROPONIN, ED: TROPONIN I, POC: 0.04 ng/mL (ref 0.00–0.08)

## 2017-09-22 MED ORDER — VENETOCLAX 10 MG PO TABS: 10.0000 mg | ORAL_TABLET | Freq: Every day | ORAL | 0 refills | Status: DC

## 2017-09-22 MED ORDER — IOPAMIDOL (ISOVUE-300) INJECTION 61%
80.0000 mL | Freq: Once | INTRAVENOUS | Status: AC | PRN
  Administered 2017-09-22: 80 mL via INTRAVENOUS

## 2017-09-22 MED ORDER — PREDNISONE 20 MG PO TABS: 10.0000 mg | ORAL_TABLET | Freq: Every day | ORAL | Status: DC

## 2017-09-22 MED ORDER — VITAMIN D 1000 UNITS PO TABS
1000.0000 [IU] | ORAL_TABLET | Freq: Every day | ORAL | Status: DC
  Administered 2017-09-23 – 2017-09-28 (×6): 1000 [IU] via ORAL
  Filled 2017-09-22 (×6): qty 1

## 2017-09-22 MED ORDER — METOPROLOL TARTRATE 25 MG PO TABS
37.5000 mg | ORAL_TABLET | Freq: Two times a day (BID) | ORAL | Status: DC
  Administered 2017-09-22 – 2017-09-28 (×12): 37.5 mg via ORAL
  Filled 2017-09-22: qty 2
  Filled 2017-09-22 (×6): qty 1
  Filled 2017-09-22: qty 2
  Filled 2017-09-22 (×4): qty 1

## 2017-09-22 MED ORDER — VANCOMYCIN HCL 10 G IV SOLR
1500.0000 mg | Freq: Once | INTRAVENOUS | Status: AC
  Administered 2017-09-22: 1500 mg via INTRAVENOUS
  Filled 2017-09-22 (×2): qty 1500

## 2017-09-22 MED ORDER — VENETOCLAX 50 MG PO TABS: 50.0000 mg | ORAL_TABLET | Freq: Every day | ORAL | 0 refills | Status: DC

## 2017-09-22 MED ORDER — SODIUM CHLORIDE 0.9 % IV BOLUS (SEPSIS)
1000.0000 mL | Freq: Once | INTRAVENOUS | Status: AC
  Administered 2017-09-22: 1000 mL via INTRAVENOUS

## 2017-09-22 MED ORDER — SODIUM CHLORIDE 0.9% FLUSH
3.0000 mL | Freq: Two times a day (BID) | INTRAVENOUS | Status: DC
  Administered 2017-09-22 – 2017-09-24 (×3): 3 mL via INTRAVENOUS

## 2017-09-22 MED ORDER — ACYCLOVIR 400 MG PO TABS
400.0000 mg | ORAL_TABLET | Freq: Every day | ORAL | Status: DC
  Administered 2017-09-23 – 2017-09-28 (×6): 400 mg via ORAL
  Filled 2017-09-22 (×7): qty 1

## 2017-09-22 MED ORDER — ALLOPURINOL 300 MG PO TABS
300.0000 mg | ORAL_TABLET | Freq: Every day | ORAL | Status: DC
Start: 2017-09-23 — End: 2017-09-28
  Administered 2017-09-23 – 2017-09-28 (×6): 300 mg via ORAL
  Filled 2017-09-22 (×4): qty 1
  Filled 2017-09-22: qty 3
  Filled 2017-09-22: qty 1

## 2017-09-22 MED ORDER — SACUBITRIL-VALSARTAN 24-26 MG PO TABS
1.0000 | ORAL_TABLET | Freq: Two times a day (BID) | ORAL | Status: DC
  Administered 2017-09-22 – 2017-09-28 (×12): 1 via ORAL
  Filled 2017-09-22 (×14): qty 1

## 2017-09-22 MED ORDER — DEXTROSE 5 % IV SOLN: 2.0000 g | Freq: Two times a day (BID) | INTRAVENOUS | Status: DC

## 2017-09-22 MED ORDER — SODIUM CHLORIDE 0.9% FLUSH: 3.0000 mL | INTRAVENOUS | Status: DC | PRN

## 2017-09-22 MED ORDER — DILTIAZEM HCL ER COATED BEADS 120 MG PO CP24: 240.0000 mg | ORAL_CAPSULE | Freq: Every day | ORAL | Status: DC

## 2017-09-22 MED ORDER — VENETOCLAX 10 MG PO TABS: 20.0000 mg | ORAL_TABLET | Freq: Every day | ORAL | 0 refills | Status: DC

## 2017-09-22 MED ORDER — DEXTROSE 5 % IV SOLN
500.0000 mg | INTRAVENOUS | Status: DC
  Administered 2017-09-22 – 2017-09-27 (×6): 500 mg via INTRAVENOUS
  Filled 2017-09-22 (×7): qty 500

## 2017-09-22 MED ORDER — VENETOCLAX 100 MG PO TABS: 100.0000 mg | ORAL_TABLET | Freq: Every day | ORAL | 0 refills | Status: DC

## 2017-09-22 MED ORDER — APIXABAN 5 MG PO TABS
5.0000 mg | ORAL_TABLET | Freq: Two times a day (BID) | ORAL | Status: DC
  Administered 2017-09-22 – 2017-09-28 (×11): 5 mg via ORAL
  Filled 2017-09-22 (×13): qty 1

## 2017-09-22 MED ORDER — SODIUM CHLORIDE 0.9 % IV SOLN: 250.0000 mL | INTRAVENOUS | Status: DC | PRN

## 2017-09-22 MED ORDER — DEXTROSE 5 % IV SOLN
2.0000 g | Freq: Two times a day (BID) | INTRAVENOUS | Status: DC
  Administered 2017-09-23 – 2017-09-28 (×11): 2 g via INTRAVENOUS
  Filled 2017-09-22 (×14): qty 2

## 2017-09-22 MED ORDER — DEXTROSE 5 % IV SOLN
1.0000 g | Freq: Once | INTRAVENOUS | Status: AC
  Administered 2017-09-22: 1 g via INTRAVENOUS
  Filled 2017-09-22 (×2): qty 1

## 2017-09-22 MED ORDER — VANCOMYCIN HCL IN DEXTROSE 1-5 GM/200ML-% IV SOLN
1000.0000 mg | Freq: Two times a day (BID) | INTRAVENOUS | Status: DC
  Administered 2017-09-23 – 2017-09-26 (×7): 1000 mg via INTRAVENOUS
  Filled 2017-09-22 (×8): qty 200

## 2017-09-22 NOTE — Telephone Encounter (Signed)
Oral Chemotherapy Pharmacist Encounter  I spoke with patient for overview of: Venclexta.  Venclexta dose will be titrated up per manufacturer recommendations.  Daily dosing of Venclexta will be continued for up to 2 years based on disease response and toleration of therapy.  Counseled patient on administration, dosing, side effects, monitoring, drug-food interactions, safe handling, storage, and disposal.  Patient will take Venclexta 10mg  tablets, 1 tablet by mouth once daily with food and water for 7 days. The 1st dose of Venclexta 10mg  tablets will be administered inpatient based on TLS risk assessment to be high risk. Baseline labs will be assessed and the 1st dose will be administered. CBC, uric acid, and electrolytes will be monitored at 4, 8, 12, and 24 hours post dose administration per protocol. Hydration will be administered IV. Venclexta start date: planned for 09/25/17  Patient was instructed to start allopurinol 300mg  every 24 hours, starting 3 days prior to Venclexta initiation.. Patient was instructed to increase fluid intake to 1L of water per day starting 2 days prior to Venclexta initiation.  Patient will take Venclexta 10mg  tablets, 2 tablets by mouth once daily with food and water for 7 days. The 1st dose of Venclexta 20mg  will be administered inpatient versus outpatient based on initiation complications.  Subsequent ramp-up doses of 50mg  daily x 7 days, 100mg  daily x 7 days, and 200mg  daily (target maintenance dose based on drug interaction with diltiazem) will be monitored per protocol.  Side effects include but not limited to: TLS, decreased blood counts, electrolyte abnormalities, diarrhea, nausea, fatigue, arthralgias/myalgias, and upper respiratory tract infection.    Reviewed with patient importance of keeping a medication schedule and plan for any missed doses.  Ms. [name] voiced understanding and appreciation.   All questions answered. Medication  reconciliation performed and medication/allergy list updated.  Medication will be ready for pick-up at the [facility] on 12/19 after 2pm. Patient will likely pick it up on the way to admission on 09/24/17.  Copayment is $5 per fill  Venclexta initiation will be dispensed weekly based on available supply and dosing adjustments.  When speaking with patient, she noted extreme dyspnea on exertion progressing over the past 72 hours that required medical consultation today. Chest CT shows progressive ground-glass attenuation and developing areas of patchy peribronchovascular consolidation throughout the lungs bilaterally. 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  Note will be routed to MD.  Patient knows to call the office with questions or concerns. Oral Oncology Clinic will continue to follow.  Thank you,  Johny Drilling, PharmD, BCPS, BCOP 09/22/2017   3:17 PM Oral Oncology Clinic [phone]

## 2017-09-22 NOTE — Telephone Encounter (Signed)
Oral Oncology Patient Advocate Encounter  Prior Authorization for Audrey Gross has been approved.    PA# [id number] Effective dates: 09/21/2017 through 09/21/2018  Oral Oncology Clinic will continue to follow.   Fabio Asa. Melynda Keller, [place] Patient [place] [phone] 09/22/2017 9:13 AM

## 2017-09-22 NOTE — Progress Notes (Signed)
Pharmacy Antibiotic Note Audrey Gross is a 56 y.o. 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 Pharmacy has been consulted for cefepime and vancomycin dosing.  Plan: 1. Vancomycin 1000 mg IV every 12 hours.  Goal trough 15-20 mcg/mL.  2. Cefepime 2 grams IV every 12 hours.  Weight: 185 lb (83.9 kg)  Temp (24hrs), Avg:98.8 F (37.1 C), Min:98.8 F (37.1 C), Max:98.8 F (37.1 C)  Recent Labs  Lab 09/21/17 0950 09/22/17 0945 09/22/17 1638  WBC 144.2*  --  140.6*  CREATININE 1.1 1.05 1.07*    Estimated Creatinine Clearance: 65.3 mL/min (A) (by C-G formula based on SCr of 1.07 mg/dL (H)).    No Known Allergies  Antimicrobials this admission: 12/18 cefepime >>  12/18 vancomycin >>   Microbiology results: px  Thank you for allowing pharmacy to be a part of this patient's care.  Vincenza Hews, PharmD, BCPS 09/22/2017, 7:23 PM

## 2017-09-22 NOTE — Progress Notes (Signed)
I have received notification from pharmacist regarding approval for venetoclax and admission to the hospital for tumor lysis monitoring.   The patient is considered extreme high risk due to high white blood cell count and grossly abnormal imaging study Due to interaction with medications, she will be started on 10 mg dose. I plan to admit her on September 24, 2017 for PICC line placement and baseline blood work monitoring and IV hydration along with high-dose allopurinol prescription.  On the morning of September 25, 2017, she will start her first dose of venetoclax and we will order blood count monitoring per protocol I estimated she will be hospitalized for 24-48 hours if she has no complications after treatment and will be discharged when ready.

## 2017-09-22 NOTE — Patient Instructions (Signed)
Cough, Adult Coughing is a reflex that clears your throat and your airways. Coughing helps to heal and protect your lungs. It is normal to cough occasionally, but a cough that happens with other symptoms or lasts a long time may be a sign of a condition that needs treatment. A cough may last only 2-3 weeks (acute), or it may last longer than 8 weeks (chronic). What are the causes? Coughing is commonly caused by:  Breathing in substances that irritate your lungs.  A viral or bacterial respiratory infection.  Allergies.  Asthma.  Postnasal drip.  Smoking.  Acid backing up from the stomach into the esophagus (gastroesophageal reflux).  Certain medicines.  Chronic lung problems, including COPD (or rarely, lung cancer).  Other medical conditions such as heart failure.  Follow these instructions at home: Pay attention to any changes in your symptoms. Take these actions to help with your discomfort:  Take medicines only as told by your health care provider. ? If you were prescribed an antibiotic medicine, take it as told by your health care provider. Do not stop taking the antibiotic even if you start to feel better. ? Talk with your health care provider before you take a cough suppressant medicine.  Drink enough fluid to keep your urine clear or pale yellow.  If the air is dry, use a cold steam vaporizer or humidifier in your bedroom or your home to help loosen secretions.  Avoid anything that causes you to cough at work or at home.  If your cough is worse at night, try sleeping in a semi-upright position.  Avoid cigarette smoke. If you smoke, quit smoking. If you need help quitting, ask your health care provider.  Avoid caffeine.  Avoid alcohol.  Rest as needed.  Contact a health care provider if:  You have new symptoms.  You cough up pus.  Your cough does not get better after 2-3 weeks, or your cough gets worse.  You cannot control your cough with suppressant  medicines and you are losing sleep.  You develop pain that is getting worse or pain that is not controlled with pain medicines.  You have a fever.  You have unexplained weight loss.  You have night sweats. Get help right away if:  You cough up blood.  You have difficulty breathing.  Your heartbeat is very fast. This information is not intended to replace advice given to you by your health care provider. Make sure you discuss any questions you have with your health care provider. Document Released: 03/21/2011 Document Revised: 02/28/2016 Document Reviewed: 11/29/2014 Elsevier Interactive Patient Education  2017 [employer]  

## 2017-09-22 NOTE — H&P (Signed)
History and Physical    Audrey Gross JHE:[id number] DOB: 1961/06/19 DOA: 09/22/2017  PCP: Janith Lima, MD   Patient coming from: Home  Chief Complaint: Cough, SOB, fever, chills  HPI: Audrey Gross is a 56 y.o. female with medical history significant for paroxysmal atrial fibrillation on Eliquis, refractory CLL, chronic systolic CHF, and hypertension, now presenting to the emergency department for evaluation of cough, dyspnea, fevers, and chills.  Patient is under the care of oncology with recent PET demonstrating continued considerable adenopathy in the neck, chest, abdomen, and pelvis, as well as diffuse new groundglass opacities in the bilateral lungs.  She has been scheduled for upcoming hospital admission in order to initiate treatment with venetoclax.  Over the past few days, she has developed progressive dry cough, shortness of breath, and fevers with chills.  She saw her PCP today for these complaints, there was concern that the new groundglass opacities on the PET/CT reflected a pneumonia, and she was directed for an outpatient CT chest with contrast, with findings suggestive of pulmonary leukostasis versus atypical pneumonia.  She then presented to the emergency department for further evaluation.  ED Course: Upon arrival to the ED, patient is found to be febrile to 38.3 degree C, saturating 75% on room air, tachypneic with a rate of 40, tachycardic in the 130s, and with stable blood pressure.  EKG features a sinus tachycardia with rate 116 and left axis deviation.  Chemistry panel notable for stable creatinine at 1.07.  CBC features a leukocytosis 240,000 and slight normocytic anemia with hemoglobin of 11.8.  Lactic acid is slightly elevated at 1.96 and blood cultures were collected in the ED, 1 L of normal saline was given, oncology was consulted by the ED physician, reviewed the case, suspects that the lung findings are secondary to pneumonia, and recommends a medical admission to [facility] with antibiotics for suspected pneumonia.  Supplemental oxygen was started, heart rate improved with the IV fluids, and the patient will be admitted to the telemetry unit for ongoing evaluation and management of acute hypoxic respiratory failure with sepsis secondary to pneumonia.  Review of Systems:  All other systems reviewed and apart from HPI, are negative.  Past Medical History:  Diagnosis Date  . Atrial fibrillation ([place])   . CLL (chronic lymphoblastic leukemia)    Leukemia  . Cystic breast   . Depression   . Diffuse cystic mastopathy   . Dyslipidemia (high LDL; low HDL)   . Hypertension   . Iron deficiency anemia, unspecified   . Lymphadenopathy of head and neck 04/05/2015  . Medical non-compliance   . Mitral regurgitation 02/2017   moderate to severe  . Persistent atrial fibrillation with rapid ventricular response ([place]) 07/15/2017    Past Surgical History:  Procedure Laterality Date  . CARDIOVERSION N/A 04/02/2017   Procedure: CARDIOVERSION;  Surgeon: Dorothy Spark, MD;  Location: [facility] ENDOSCOPY;  Service: Cardiovascular;  Laterality: N/A;  . CARDIOVERSION N/A 08/19/2017   Procedure: CARDIOVERSION;  Surgeon: Larey Dresser, MD;  Location: [facility] ENDOSCOPY;  Service: Cardiovascular;  Laterality: N/A;  . TUBAL LIGATION       reports that  has never smoked. she has never used smokeless tobacco. She reports that she does not drink alcohol or use drugs.  No Known Allergies  Family History  Problem Relation Age of Onset  . Sudden death Mother   . Kidney disease Father        H/O HD and kidney transplant  .  Stomach cancer Maternal Aunt   . Stomach cancer Paternal Grandmother   . Heart disease Brother   . Heart disease Maternal Grandmother      Prior to Admission medications   Medication Sig Start Date End Date Taking? Authorizing Provider  acyclovir (ZOVIRAX) 400 MG tablet TAKE 1 TABLET BY MOUTH EVERY DAY Patient taking differently: Take 400 mg by  mouth once a day 09/22/17  Yes Gorsuch, Ni, MD  allopurinol (ZYLOPRIM) 300 MG tablet Take 1 tablet (300 mg total) by mouth daily. 06/11/17  Yes Heath Lark, MD  Cholecalciferol (VITAMIN D-3) 1000 units CAPS Take 1,000 Units by mouth daily with breakfast.   Yes [provider]  diltiazem (CARDIZEM CD) 240 MG 24 hr capsule Take 1 capsule (240 mg total) daily by mouth. 08/18/17  Yes Croitoru, Mihai, MD  diltiazem 2 % GEL Apply 1 application topically 3 (three) times daily. Use for 6 weeks Patient taking differently: Apply 1 application topically See admin instructions. TWO TIMES A DAY TO RECTAL AREA 07/24/17  Yes Willia Craze, NP  ELIQUIS 5 MG TABS tablet TAKE 1 TABLET BY MOUTH TWICE A DAY Patient taking differently: Take 5 mg by mouth two times a day 08/29/17  Yes Croitoru, Mihai, MD  furosemide (LASIX) 40 MG tablet TAKE 1 TABLET BY MOUTH EVERY DAY Patient taking differently: Take 40 mg by mouth once a day 09/21/17  Yes Theora Gianotti, NP  metoprolol tartrate (LOPRESSOR) 25 MG tablet Take 1.5 tablets (37.5 mg total) by mouth 2 (two) times daily. 07/29/17 10/27/17 Yes Croitoru, Mihai, MD  ondansetron (ZOFRAN) 8 MG tablet Take 1 tablet (8 mg total) by mouth every 8 (eight) hours as needed for refractory nausea / vomiting. 06/11/17  Yes Gorsuch, Ni, MD  predniSONE (DELTASONE) 10 MG tablet TAKE 1 TABLET BY MOUTH DAILY WITH BREAKFAST Patient taking differently: Take 10 mg by mouth once a day with breakfast 09/22/17  Yes Gorsuch, Ni, MD  prochlorperazine (COMPAZINE) 10 MG tablet Take 1 tablet (10 mg total) by mouth every 6 (six) hours as needed (Nausea or vomiting). 06/11/17  Yes Gorsuch, Ni, MD  sacubitril-valsartan (ENTRESTO) 24-26 MG Take 1 tablet by mouth 2 (two) times daily. 08/05/17  Yes Croitoru, Mihai, MD  spironolactone (ALDACTONE) 25 MG tablet Take 0.5 tablets (12.5 mg total) by mouth daily. 02/28/17  Yes Theora Gianotti, NP  Venetoclax (VENCLEXTA) 10 MG TABS Take 10 mg  by mouth daily. For week 1 dosing. Take with food and a glass of water. 09/22/17   Heath Lark, MD  Venetoclax (VENCLEXTA) 10 MG TABS Take 20 mg by mouth daily. For week 2 dosing. Take with food and a glass of water. 09/22/17   Heath Lark, MD  Venetoclax (VENCLEXTA) 100 MG TABS Take 100 mg by mouth daily. For week 4 dosing. Take with food and a glass of water. 09/22/17   Heath Lark, MD  Venetoclax (VENCLEXTA) 50 MG TABS Take 50 mg by mouth daily. For week 3 dosing. Take with food and a glass of water. 09/22/17   Heath Lark, MD    Physical Exam: Vitals:   09/22/17 1945 09/22/17 2000 09/22/17 2015 09/22/17 2020  BP: 109/80 110/79 113/81   Pulse: 98 95 92   Resp: (!) 33 (!) 25 (!) 22   Temp:      TempSrc:      SpO2: 98% 97% 98%   Weight:    83.9 kg (185 lb)  Height:    5\' 7"  (1.702 m)  Constitutional: NAD, calm  Eyes: PERTLA, lids and conjunctivae normal ENMT: Mucous membranes are moist. Posterior pharynx clear of any exudate or lesions.   Neck: normal, supple, no masses, no thyromegaly Respiratory: Coarse breath sounds diffusely. Normal respiratory effort. No pallor.  Cardiovascular: S1 & S2 heard, regular rate and rhythm, no significant murmurs / rubs / gallops. No extremity edema. No significant JVD. Abdomen: No distension, no tenderness, no masses palpated. Bowel sounds normal.  Musculoskeletal: no clubbing / cyanosis. No joint deformity upper and lower extremities.   Skin: no significant rashes, lesions, ulcers. Warm, dry, well-perfused. Neurologic: CN 2-12 grossly intact. Sensation intact. Strength 5/5 in all 4 limbs.  Psychiatric: Alert and oriented x 3. Pleasant, cooperative.     Labs on Admission: I have personally reviewed following labs and imaging studies  CBC: Recent Labs  Lab 09/21/17 0950 09/22/17 1638  WBC 144.2* 140.6*  NEUTROABS  --  7.0  HGB 12.8 11.8*  HCT 39.8 36.4  MCV 85.8 86.9  PLT 235 086   Basic Metabolic Panel: Recent Labs  Lab  09/21/17 0950 09/22/17 0945 09/22/17 1638  NA 140 142 137  K 3.2* 3.6 4.9  CL  --  107 107  CO2 16* 24 23  GLUCOSE 141* 102* 100*  BUN 21.1 19 15   CREATININE 1.1 1.05 1.07*  CALCIUM 10.1 9.5 9.7   GFR: Estimated Creatinine Clearance: 65.3 mL/min (A) (by C-G formula based on SCr of 1.07 mg/dL (H)). Liver Function Tests: Recent Labs  Lab 09/21/17 0950 09/22/17 1638  AST 19 37  ALT 11 13*  ALKPHOS 65 57  BILITOT 0.92 0.8  PROT 6.5 5.8*  ALBUMIN 3.5 3.3*   No results for input(s): LIPASE, AMYLASE in the last 168 hours. No results for input(s): AMMONIA in the last 168 hours. Coagulation Profile: No results for input(s): INR, PROTIME in the last 168 hours. Cardiac Enzymes: No results for input(s): CKTOTAL, CKMB, CKMBINDEX, TROPONINI in the last 168 hours. BNP (last 3 results) No results for input(s): PROBNP in the last 8760 hours. HbA1C: No results for input(s): HGBA1C in the last 72 hours. CBG: Recent Labs  Lab 09/18/17 0707  GLUCAP 97   Lipid Profile: No results for input(s): CHOL, HDL, LDLCALC, TRIG, CHOLHDL, LDLDIRECT in the last 72 hours. Thyroid Function Tests: No results for input(s): TSH, T4TOTAL, FREET4, T3FREE, THYROIDAB in the last 72 hours. Anemia Panel: No results for input(s): VITAMINB12, FOLATE, FERRITIN, TIBC, IRON, RETICCTPCT in the last 72 hours. Urine analysis:    Component Value Date/Time   COLORURINE LT. YELLOW 07/26/2013 [place] 07/26/2013 0934   LABSPEC 1.020 07/26/2013 0934   PHURINE 6.5 07/26/2013 0934   GLUCOSEU NEGATIVE 07/26/2013 0934   HGBUR NEGATIVE 07/26/2013 0934   BILIRUBINUR NEGATIVE 07/26/2013 0934   KETONESUR NEGATIVE 07/26/2013 0934   PROTEINUR NEGATIVE 03/25/2012 1515   UROBILINOGEN 0.2 07/26/2013 0934   NITRITE NEGATIVE 07/26/2013 0934   LEUKOCYTESUR NEGATIVE 07/26/2013 0934   Sepsis Labs: @LABRCNTIP (procalcitonin:4,lacticidven:4) ) Recent Results (from the past 240 hour(s))  TECHNOLOGIST REVIEW      Status: None   Collection Time: 09/21/17  9:50 AM  Result Value Ref Range Status   Technologist Review Variant lymphs present, smudge cells  Final     Radiological Exams on Admission: Dg Chest 2 View  Result Date: 09/22/2017 CLINICAL DATA:  Sob. Pt has been having sob for 3 days w/ a dry cough. She said sometimes she'll cough until she feels like throwing up, but doesn't. Pt  stated it just feels like her sob and cough are getting worse. Pt displayed heavy breathing after talking for hx and following respirations for exam. Hx of A-fib, cystic breast, diffuse cystic mastopathy, HTN, mitral regurgitation, persistent A-fib w/ rapid ventricular response EXAM: CHEST  2 VIEW COMPARISON:  Chest CT, 09/22/2017.  Chest radiograph, 07/15/2017. FINDINGS: Mild enlargement of the cardiac silhouette, stable. Mild thickening along the right peritracheal stripe. Mild prominence of the hila. This reflects the adenopathy better seen on the current CT. Subtle patchy hazy opacities are noted in the lungs, most evident in the left mid to upper lung on the frontal view. This was also better appreciated on the current CT. The ground-glass opacity noted in the lower lungs on the current CT is not well appreciated radiographically. No new lung abnormalities. No pleural effusion. No pneumothorax. Skeletal structures are unremarkable. Subtle soft tissue masses are evident along the axilla consistent with the adenopathy noted on the current CT. IMPRESSION: 1. Lung opacities noted on the current CT are not as well-defined radiographically, only subtly evident. No new lung abnormalities. 2. Mediastinal and hilar adenopathy as well as axillary adenopathy is also less well-defined than it was on the current CT. Electronically Signed   By: Lajean Manes M.D.   On: 09/22/2017 17:40   Ct Chest W Contrast  Result Date: 09/22/2017 CLINICAL DATA:  56 year old female with increasing shortness of breath for the past 3 days. History of  chronic cough. Evaluate for pneumonia. History of chronic lymphocytic leukemia (CLL) and atrial fibrillation. EXAM: CT CHEST WITH CONTRAST TECHNIQUE: Multidetector CT imaging of the chest was performed during intravenous contrast administration. CONTRAST:  40mL ISOVUE-300 IOPAMIDOL (ISOVUE-300) INJECTION 61% COMPARISON:  PET-CT 09/18/2017. FINDINGS: Cardiovascular: Heart size is borderline enlarged. There is no significant pericardial fluid, thickening or pericardial calcification. There is aortic atherosclerosis, as well as atherosclerosis of the great vessels of the mediastinum and the coronary arteries, including calcified atherosclerotic plaque in the left anterior descending, left circumflex and right coronary arteries. Dilatation of the pulmonic trunk (4.1 cm in diameter), concerning for pulmonary arterial hypertension. Mediastinum/Nodes: Extensive mediastinal and bilateral hilar lymphadenopathy, similar to recent PET-CT, with the largest lymph nodes measuring up to 18 mm in short axis in the right hilar region. Esophagus is unremarkable in appearance. Numerous enlarged axillary lymph nodes bilaterally measuring up to 17 mm in short axis on the left and 18 mm in short axis on the right. Lungs/Pleura: As with the recent PET-CT there is extensive patchy ground-glass attenuation scattered throughout the lungs bilaterally, the severity of which is very similar to the prior study. A few scattered areas of more dense peribronchovascular airspace consolidation are also noted, slightly increased compared to the recent examination. No pleural effusions. No definite discrete suspicious pulmonary nodules or masses. Upper Abdomen: Spleen is incompletely visualized but appears likely enlarged measuring up to of 4.0 x 12.0 cm Musculoskeletal: There are no aggressive appearing lytic or blastic lesions noted in the visualized portions of the skeleton. IMPRESSION: 1. Unusual appearance of the lung parenchyma with progressive  ground-glass attenuation and developing areas of patchy peribronchovascular consolidation throughout the lungs bilaterally. 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 Alternatively, findings may simply reflect evolving atypical infection in the setting of underlying CLL, or are less likely related to direct pulmonic leukemic infiltration. 2. Widespread mediastinal, bilateral hilar and bilateral axillary lymphadenopathy related to underlying CLL. Probable splenomegaly also noted. 3. Aortic atherosclerosis, in addition to three-vessel coronary artery  disease. Please note that although the presence of coronary artery calcium documents the presence of coronary artery disease, the severity of this disease and any potential stenosis cannot be assessed on this non-gated CT examination. Assessment for potential risk factor modification, dietary therapy or pharmacologic therapy may be warranted, if clinically indicated. 4. Dilatation of the pulmonic trunk (4.1 cm in diameter), concerning for pulmonary arterial hypertension. Aortic Atherosclerosis (ICD10-I70.0). Electronically Signed   By: Vinnie Langton M.D.   On: 09/22/2017 12:02    EKG: Independently reviewed. Sinus tachycardia (rate 116), LAD.   Assessment/Plan  1. Pneumonia; acute hypoxic respiratory failure - Pt presents with several days of progressive cough, SOB, and f/c - CT chest with new bilateral ground-glass opacities and peribronchovascular consolidations concerning for pulmonary leukostasis versus PNA  - Appreciate oncologist input that this is likely PNA; admission to [facility] was recommended for abx  - Blood cultures were collected in ED and she was started on empiric vancomycin and cefepime  - Add sputum culture and respiratory virus panel; add azithromycin for atypical coverage, and add conjunctive glucocorticoid   - Continue supportive care with supplemental O2  2. CLL  -  Unfortunately, this has been refractory to initial treatments  - She is scheduled for upcoming admission in order to initiate venetoclax  - Oncology was consulted by ED physician and much appreciated    3. Paroxysmal atrial fibrillation  - In a sinus rhythm on admission - CHADS-VASc is at least 2 (gender, CHF)  - Continue Eliquis, continue metoprolol    4. Chronic systolic CHF  - Appears euvolemic on admission  - Treated in ED with 1 liter NS  - SLIV, follow daily wts and I/O's, continue to hold diuretics initially  - Continue Entresto and beta-blocker     DVT prophylaxis: Eliquis  Code Status: Full  Family Communication: Husband updated at bedside Disposition Plan: Admit to telemetry Consults called: Oncology Admission status: Inpatient    Vianne Bulls, MD [facility] Pager [phone]  If 7PM-7AM, please contact night-coverage [email or web address] Password [facility]  09/22/2017, 8:54 PM

## 2017-09-22 NOTE — ED Triage Notes (Signed)
SOB that began Saturday but got progressively worse today. On arrival to ED pt spo2 75% on RA with hr 133. Denies having any CP. Pt placed on 15L NRB and spo2 increased to 100%.

## 2017-09-22 NOTE — Progress Notes (Signed)
Subjective:  Patient ID: Audrey Gross, female    DOB: 08-Oct-1960  Age: 56 y.o. MRN: [id number]  CC: Cough   HPI Audrey Gross presents for f/up - She complains of worsening nonproductive cough with low-grade fever and chills, shortness of breath, fatigue, and night sweats.  She underwent a PET scan about 4 days ago that showed diffuse new ground-glass opacities in the lungs with associated hypermetabolic activity. Some forms of lymphoma infiltration can rarely cause this pattern of diffuse ground-glass opacity and hypermetabolic activity. Differential diagnostic considerations might include atypical pneumonia such as mycoplasma, acute hypersensitivity pneumonitis, or acute eosinophilic pneumonia.  Her recent lab work showed a white cell count up to 144,000 with a low bicarb and slightly elevated anion gap.  Outpatient Medications Prior to Visit  Medication Sig Dispense Refill  . acyclovir (ZOVIRAX) 400 MG tablet TAKE 1 TABLET BY MOUTH EVERY DAY 30 tablet 3  . allopurinol (ZYLOPRIM) 300 MG tablet Take 1 tablet (300 mg total) by mouth daily. 30 tablet 3  . diltiazem (CARDIZEM CD) 240 MG 24 hr capsule Take 1 capsule (240 mg total) daily by mouth. 30 capsule 5  . diltiazem 2 % GEL Apply 1 application topically 3 (three) times daily. Use for 6 weeks (Patient taking differently: Apply 1 application 2 (two) times daily topically. Use for 6 weeks) 30 g 1  . ELIQUIS 5 MG TABS tablet TAKE 1 TABLET BY MOUTH TWICE A DAY 180 tablet 1  . furosemide (LASIX) 40 MG tablet TAKE 1 TABLET BY MOUTH EVERY DAY 90 tablet 3  . metoprolol tartrate (LOPRESSOR) 25 MG tablet Take 1.5 tablets (37.5 mg total) by mouth 2 (two) times daily. 90 tablet 3  . ondansetron (ZOFRAN) 8 MG tablet Take 1 tablet (8 mg total) by mouth every 8 (eight) hours as needed for refractory nausea / vomiting. 30 tablet 1  . predniSONE (DELTASONE) 10 MG tablet TAKE 1 TABLET BY MOUTH DAILY WITH BREAKFAST 30 tablet 0  . prochlorperazine (COMPAZINE)  10 MG tablet Take 1 tablet (10 mg total) by mouth every 6 (six) hours as needed (Nausea or vomiting). 30 tablet 1  . sacubitril-valsartan (ENTRESTO) 24-26 MG Take 1 tablet by mouth 2 (two) times daily. 60 tablet 3  . spironolactone (ALDACTONE) 25 MG tablet Take 0.5 tablets (12.5 mg total) by mouth daily. 30 tablet 3  . Vitamin D, Cholecalciferol, 1000 units CAPS Take 1,000 Units by mouth every morning.      No facility-administered medications prior to visit.     ROS Review of Systems  Constitutional: Positive for chills, diaphoresis, fatigue and fever.  HENT: Negative.   Eyes: Negative.  Negative for visual disturbance.  Respiratory: Positive for cough and shortness of breath. Negative for choking and wheezing.   Cardiovascular: Positive for palpitations. Negative for chest pain and leg swelling.  Gastrointestinal: Negative for abdominal pain, constipation, diarrhea, nausea and vomiting.  Endocrine: Negative.   Genitourinary: Negative.  Negative for difficulty urinating.  Musculoskeletal: Negative.  Negative for back pain, myalgias and neck pain.  Skin: Negative.  Negative for rash.  Allergic/Immunologic: Negative.   Neurological: Negative.  Negative for dizziness, weakness and light-headedness.  Hematological: Positive for adenopathy. Does not bruise/bleed easily.  Psychiatric/Behavioral: Negative.     Objective:  BP 122/70 (BP Location: Left Arm, Patient Position: Sitting, Cuff Size: Normal)   Pulse (!) 108   Temp 98.8 F (37.1 C) (Oral)   Resp (!) 26   Ht 5\' 7"  (1.702 m)  Wt 185 lb (83.9 kg)   SpO2 96%   BMI 28.98 kg/m   BP Readings from Last 3 Encounters:  09/22/17 122/70  09/21/17 113/73  08/25/17 113/68    Wt Readings from Last 3 Encounters:  09/22/17 185 lb (83.9 kg)  09/21/17 183 lb 8 oz (83.2 kg)  08/30/17 193 lb (87.5 kg)    Physical Exam  Constitutional: She is oriented to person, place, and time. She appears distressed.  HENT:  Mouth/Throat:  Oropharynx is clear and moist. No oropharyngeal exudate.  Eyes: Conjunctivae are normal. Left eye exhibits no discharge. No scleral icterus.  Neck: Normal range of motion. Neck supple. No JVD present. No thyromegaly present.  Cardiovascular: Regular rhythm and normal heart sounds. Tachycardia present.  No murmur heard. Pulmonary/Chest: Breath sounds normal. No accessory muscle usage. Tachypnea noted. No respiratory distress. She has no decreased breath sounds. She has no wheezes. She has no rhonchi. She has no rales. She exhibits no tenderness.  Abdominal: Soft. Bowel sounds are normal. There is no tenderness. There is no rebound and no guarding.  Musculoskeletal: Normal range of motion. She exhibits no edema, tenderness or deformity.  Lymphadenopathy:    She has no cervical adenopathy.  Neurological: She is alert and oriented to person, place, and time.  Skin: Skin is warm and dry. No rash noted. She is not diaphoretic. No erythema. No pallor.  Vitals reviewed.   Lab Results  Component Value Date   WBC 144.2 (HH) 09/21/2017   HGB 12.8 09/21/2017   HCT 39.8 09/21/2017   PLT 235 09/21/2017   GLUCOSE 141 (H) 09/21/2017   CHOL 170 07/16/2017   TRIG 100 07/16/2017   HDL 31 (L) 07/16/2017   LDLDIRECT 144.5 07/26/2013   LDLCALC 119 (H) 07/16/2017   ALT 11 09/21/2017   AST 19 09/21/2017   NA 140 09/21/2017   K 3.2 (L) 09/21/2017   CL 103 08/21/2017   CREATININE 1.1 09/21/2017   BUN 21.1 09/21/2017   CO2 16 (L) 09/21/2017   TSH 0.069 (L) 07/15/2017   INR 1.40 07/16/2017    Nm Pet Image Restag (ps) Skull Base To Thigh  Result Date: 09/18/2017 CLINICAL DATA:  Subsequent treatment strategy for chronic lymphocytic leukemia. EXAM: NUCLEAR MEDICINE PET SKULL BASE TO THIGH TECHNIQUE: 9.3 mCi F-18 FDG was injected intravenously. Full-ring PET imaging was performed from the skull base to thigh after the radiotracer. CT data was obtained and used for attenuation correction and anatomic  localization. FASTING BLOOD GLUCOSE:  Value: 97 mg/dl COMPARISON:  Multiple exams, including 05/27/2017 FINDINGS: NECK Bilateral level IIa, level II B, level V, level III, and level IV lymph nodes are present as on the prior exam. An index right level II B lymph node measures 1.3 cm in short axis on image 33/4 (formerly 1.4 cm) and has a maximum SUV of 3.3 (formerly 4.7). CHEST Bilateral hypermetabolic axillary, paratracheal, AP window, supraclavicular, hilar, and infrahilar adenopathy observed. Index left axillary node 1.9 cm in short axis on image 54/4 with maximum SUV 3.0 (formerly 1.8 cm by my measurement with maximum SUV 3.9). There are patchy bilateral ground-glass opacities throughout the lungs with associated accentuated metabolic activity, new from the prior exam. Accentuated activity in the right posterior hemithorax with maximum SUV of 5.8. Some of the areas are of hypermetabolic activity are more confluent and in the right upper lobe an area of confluent metabolic activity has maximum SUV of 8.9 but with only indistinct ground-glass opacity on the lung window images.  Thyroid goiter without hypermetabolic activity. Coronary artery atherosclerosis with mild cardiomegaly. Background blood pool activity 2.3 ABDOMEN/PELVIS Pathologic right gastric, peripancreatic, central mesenteric, retroperitoneal/periaortic, external iliac, common iliac, inguinal, and obturator adenopathy noted. A representative index right obturator lymph node measures 1.5 cm in short axis on image 166/4 and has maximum SUV of 5.0 (formerly 1.5 cm in diameter with maximum SUV 4.3). The left obturator node measures 1.6 cm in short axis on image 167/4 (formerly 1.8 cm) and has maximum SUV of 3.5 (formerly 4.5). The spleen measures 10.3 by 4.6 by 11.7 cm (volume = 290 cm^3). No focal splenic abnormal activity identified. Aortoiliac atherosclerotic vascular disease. Small indirect inguinal hernias contain adipose tissue. Background hepatic  activity 4.1. SKELETON No focal hypermetabolic activity to suggest skeletal metastasis. IMPRESSION: 1. Continued considerable adenopathy in the neck, chest, abdomen, and pelvis. This is generally stable in size but mildly reduced in activity compared to the prior exam. Current levels of activity primarily Deauville 3 and Deauville 4. No splenomegaly. 2. Diffuse new ground-glass opacities in the lungs with associated hypermetabolic activity. Some forms of lymphoma infiltration can rarely cause this pattern of diffuse ground-glass opacity and hypermetabolic activity. Differential diagnostic considerations might include atypical pneumonia such as mycoplasma, acute hypersensitivity pneumonitis, or acute eosinophilic pneumonia. Pulmonary hemorrhage seems less likely to cause this degree of accentuated metabolic activity. 3.  Aortic Atherosclerosis (ICD10-I70.0).  Coronary atherosclerosis. Electronically Signed   By: Van Clines M.D.   On: 09/18/2017 10:05    Assessment & Plan:   Ania was seen today for cough.  Diagnoses and all orders for this visit:  Pneumonia of both lungs due to infectious organism, unspecified part of lung - the abnormal PET scan could be a sequelae of her worsening CLL but I am concerned that she may have an atypical pneumonia.  I have ordered a stat CT with contrast today and if it confirms that she does have pneumonia then I will recommend inpatient treatment with IV antibiotics. -     CT Chest W Contrast; Future  PAF (paroxysmal atrial fibrillation) ([facility])- she is tachycardic today but she is in sinus rhythm.  Will monitor her TFTs to see if they are contributing to this. -     Thyroid Panel With TSH; Future  Essential hypertension- I will recheck her electrolytes and renal function today.  Will also monitor her CO2 level. -     Basic metabolic panel; Future   I am having Purvi B. Romanello maintain her Vitamin D (Cholecalciferol), spironolactone, ondansetron,  prochlorperazine, allopurinol, diltiazem, metoprolol tartrate, sacubitril-valsartan, diltiazem, ELIQUIS, furosemide, predniSONE, and acyclovir.  No orders of the defined types were placed in this encounter.    Follow-up: No Follow-up on file.  [name], MD

## 2017-09-22 NOTE — Progress Notes (Signed)
She does not have sleep apnea MCr

## 2017-09-22 NOTE — ED Provider Notes (Signed)
[facility] EMERGENCY DEPARTMENT Provider Note   CSN: [id number] Arrival date & time: 09/22/17  1604     History   Chief Complaint Chief Complaint  Patient presents with  . Shortness of Breath    HPI Audrey Gross is a 56 y.o. female with a history of CLL, A. fib RVR, probable pneumonia, obesity, who presents today for evaluation of worsening shortness of breath.  She reports that over the past few days she has had cough without fevers, and worsening shortness of breath.  She was seen this morning at her primary care doctor's office who arranged for her to have a CTA of her chest, and then directed her to [facility] for possible atypical pneumonia.  She denies chest pain, abdominal pain, N/V/D.    On arrival at the ED she was started on oxygen after being at 75% on room air.  She does not normally wear oxygen.   HPI  Past Medical History:  Diagnosis Date  . Atrial fibrillation ([place])   . CLL (chronic lymphoblastic leukemia)    Leukemia  . Cystic breast   . Depression   . Diffuse cystic mastopathy   . Dyslipidemia (high LDL; low HDL)   . Hypertension   . Iron deficiency anemia, unspecified   . Lymphadenopathy of head and neck 04/05/2015  . Medical non-compliance   . Mitral regurgitation 02/2017   moderate to severe  . Persistent atrial fibrillation with rapid ventricular response ([place]) 07/15/2017    Patient Active Problem List   Diagnosis Date Noted  . Pneumonia of both lungs due to infectious organism 09/22/2017  . Chronic pneumonia 09/22/2017  . Chronic systolic heart failure ([place]) 06/27/2017  . Goals of care, counseling/discussion 05/29/2017  . Nonischemic cardiomyopathy ([place]) 04/07/2017  . Chronic anticoagulation 03/23/2017  . Mitral regurgitation 02/28/2017  . Obesity 08/12/2016  . Hypertriglyceridemia without hypercholesterolemia 07/12/2016  . Screening for cervical cancer 06/11/2016  . PAF (paroxysmal atrial fibrillation) ([place]) 01/01/2016    . Abnormal mammogram of right breast 02/28/2014  . Dyslipidemia (high LDL; low HDL) 07/22/2012  . Osteopenia 06/21/2012  . Routine general medical examination at a health care facility 06/21/2012  . Hypertension 06/21/2012  . CLL (chronic lymphocytic leukemia) ([place]) 01/05/2012  . Iron deficiency anemia 01/05/2012  . Depression 01/05/2012  . 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 CARDIOVERSION N/A 04/02/2017   Procedure: CARDIOVERSION;  Surgeon: Dorothy Spark, MD;  Location: [facility] ENDOSCOPY;  Service: Cardiovascular;  Laterality: N/A;  . CARDIOVERSION N/A 08/19/2017   Procedure: CARDIOVERSION;  Surgeon: Larey Dresser, MD;  Location: [facility] ENDOSCOPY;  Service: Cardiovascular;  Laterality: N/A;  . TUBAL LIGATION      OB History    Gravida Para Term Preterm AB Living   6 6 6     5    SAB TAB Ectopic Multiple Live Births           5       Home Medications    Prior to Admission medications   Medication Sig Start Date End Date Taking? Authorizing Provider  acyclovir (ZOVIRAX) 400 MG tablet TAKE 1 TABLET BY MOUTH EVERY DAY Patient taking differently: Take 400 mg by mouth once a day 09/22/17  Yes Gorsuch, Ni, MD  allopurinol (ZYLOPRIM) 300 MG tablet Take 1 tablet (300 mg total) by mouth daily. 06/11/17  Yes Heath Lark, MD  Cholecalciferol (VITAMIN D-3) 1000 units  CAPS Take 1,000 Units by mouth daily with breakfast.   Yes [provider]  diltiazem (CARDIZEM CD) 240 MG 24 hr capsule Take 1 capsule (240 mg total) daily by mouth. 08/18/17  Yes Croitoru, Mihai, MD  diltiazem 2 % GEL Apply 1 application topically 3 (three) times daily. Use for 6 weeks Patient taking differently: Apply 1 application topically See admin instructions. TWO TIMES A DAY TO RECTAL AREA 07/24/17  Yes Willia Craze, NP  ELIQUIS 5 MG TABS tablet TAKE 1 TABLET BY MOUTH TWICE A DAY Patient taking differently:  Take 5 mg by mouth two times a day 08/29/17  Yes Croitoru, Mihai, MD  furosemide (LASIX) 40 MG tablet TAKE 1 TABLET BY MOUTH EVERY DAY Patient taking differently: Take 40 mg by mouth once a day 09/21/17  Yes Theora Gianotti, NP  metoprolol tartrate (LOPRESSOR) 25 MG tablet Take 1.5 tablets (37.5 mg total) by mouth 2 (two) times daily. 07/29/17 10/27/17 Yes Croitoru, Mihai, MD  ondansetron (ZOFRAN) 8 MG tablet Take 1 tablet (8 mg total) by mouth every 8 (eight) hours as needed for refractory nausea / vomiting. 06/11/17  Yes Gorsuch, Ni, MD  predniSONE (DELTASONE) 10 MG tablet TAKE 1 TABLET BY MOUTH DAILY WITH BREAKFAST Patient taking differently: Take 10 mg by mouth once a day with breakfast 09/22/17  Yes Gorsuch, Ni, MD  prochlorperazine (COMPAZINE) 10 MG tablet Take 1 tablet (10 mg total) by mouth every 6 (six) hours as needed (Nausea or vomiting). 06/11/17  Yes Gorsuch, Ni, MD  sacubitril-valsartan (ENTRESTO) 24-26 MG Take 1 tablet by mouth 2 (two) times daily. 08/05/17  Yes Croitoru, Mihai, MD  spironolactone (ALDACTONE) 25 MG tablet Take 0.5 tablets (12.5 mg total) by mouth daily. 02/28/17  Yes Theora Gianotti, NP  Venetoclax (VENCLEXTA) 10 MG TABS Take 10 mg by mouth daily. For week 1 dosing. Take with food and a glass of water. 09/22/17   Heath Lark, MD  Venetoclax (VENCLEXTA) 10 MG TABS Take 20 mg by mouth daily. For week 2 dosing. Take with food and a glass of water. 09/22/17   Heath Lark, MD  Venetoclax (VENCLEXTA) 100 MG TABS Take 100 mg by mouth daily. For week 4 dosing. Take with food and a glass of water. 09/22/17   Heath Lark, MD  Venetoclax (VENCLEXTA) 50 MG TABS Take 50 mg by mouth daily. For week 3 dosing. Take with food and a glass of water. 09/22/17   Heath Lark, MD    Family History Family History  Problem Relation Age of Onset  . Sudden death Mother   . Kidney disease Father        H/O HD and kidney transplant  . Stomach cancer Maternal Aunt   . Stomach  cancer Paternal Grandmother   . Heart disease Brother   . Heart disease Maternal Grandmother     Social History Social History   Tobacco Use  . Smoking status: Never Smoker  . Smokeless tobacco: Never Used  Substance Use Topics  . Alcohol use: No  . Drug use: No     Allergies   Patient has no known allergies.   Review of Systems Review of Systems  Constitutional: Negative for chills and fever.  HENT: Negative for ear pain and sore throat.   Eyes: Negative for pain and visual disturbance.  Respiratory: Positive for cough and shortness of breath.   Cardiovascular: Negative for chest pain, palpitations and leg swelling.  Gastrointestinal: Negative for abdominal pain, diarrhea, nausea and vomiting.  Genitourinary: Negative for dysuria and hematuria.  Musculoskeletal: Negative for arthralgias and back pain.  Skin: Negative for color change and rash.  Allergic/Immunologic: Positive for immunocompromised state.  Neurological: Positive for light-headedness (When moving). Negative for seizures and syncope.  All other systems reviewed and are negative.    Physical Exam Updated Vital Signs BP 113/81   Pulse 92   Temp (!) 101 F (38.3 C) (Rectal)   Resp (!) 22   Ht 5\' 7"  (1.702 m)   Wt 83.9 kg (185 lb)   SpO2 98%   BMI 28.98 kg/m   Physical Exam  Constitutional: She is oriented to person, place, and time. She appears well-developed and well-nourished. No distress.  HENT:  Head: Normocephalic and atraumatic.  Eyes: Conjunctivae are normal.  Neck: Neck supple.  Cardiovascular: Normal rate and regular rhythm.  No murmur heard. Pulmonary/Chest: Accessory muscle usage present. Tachypnea noted. She has decreased breath sounds (diffuse bilaterally. ). She has no wheezes. She has no rhonchi. She has no rales.  Abdominal: Soft. Bowel sounds are normal. She exhibits no distension. There is no tenderness.  Musculoskeletal: She exhibits no edema.       Right lower leg: Normal.  She exhibits no tenderness and no edema.       Left lower leg: Normal. She exhibits no tenderness and no edema.  Neurological: She is alert and oriented to person, place, and time.  Skin: Skin is warm and dry.  Psychiatric: She has a normal mood and affect. Her behavior is normal.  Nursing note and vitals reviewed.    ED Treatments / Results  Labs (all labs ordered are listed, but only abnormal results are displayed) Labs Reviewed  CBC WITH DIFFERENTIAL/PLATELET - Abnormal; Notable for the following components:      Result Value   WBC 140.6 (*)    Hemoglobin 11.8 (*)    RDW 18.0 (*)    Lymphs Abs 132.2 (*)    Monocytes Absolute 0.0 (*)    Eosinophils Absolute 1.4 (*)    All other components within normal limits  COMPREHENSIVE METABOLIC PANEL - Abnormal; Notable for the following components:   Glucose, Bld 100 (*)    Creatinine, Ser 1.07 (*)    Total Protein 5.8 (*)    Albumin 3.3 (*)    ALT 13 (*)    GFR calc non Af Amer 57 (*)    All other components within normal limits  I-STAT CG4 LACTIC ACID, ED - Abnormal; Notable for the following components:   Lactic Acid, Venous 1.96 (*)    All other components within normal limits  CULTURE, BLOOD (ROUTINE X 2)  CULTURE, BLOOD (ROUTINE X 2)  URINALYSIS, ROUTINE W REFLEX MICROSCOPIC  I-STAT TROPONIN, ED  I-STAT CG4 LACTIC ACID, ED    EKG  EKG Interpretation  Date/Time:  Tuesday September 22 2017 16:11:03 EST Ventricular Rate:  116 PR Interval:  132 QRS Duration: 80 QT Interval:  306 QTC Calculation: 425 R Axis:   -30 Text Interpretation:  Sinus tachycardia Right atrial enlargement Left axis deviation Moderate voltage criteria for LVH, may be normal variant Nonspecific ST and T wave abnormality Abnormal ECG Since previous tracing rate faster, baseline artifact makes comparison difficult Confirmed by Alfonzo Beers [phone]) on 09/22/2017 8:43:46 PM       Radiology Dg Chest 2 View  Result Date: 09/22/2017 CLINICAL DATA:   Sob. Pt has been having sob for 3 days w/ a dry cough. She said sometimes she'll cough until she feels  like throwing up, but doesn't. Pt stated it just feels like her sob and cough are getting worse. Pt displayed heavy breathing after talking for hx and following respirations for exam. Hx of A-fib, cystic breast, diffuse cystic mastopathy, HTN, mitral regurgitation, persistent A-fib w/ rapid ventricular response EXAM: CHEST  2 VIEW COMPARISON:  Chest CT, 09/22/2017.  Chest radiograph, 07/15/2017. FINDINGS: Mild enlargement of the cardiac silhouette, stable. Mild thickening along the right peritracheal stripe. Mild prominence of the hila. This reflects the adenopathy better seen on the current CT. Subtle patchy hazy opacities are noted in the lungs, most evident in the left mid to upper lung on the frontal view. This was also better appreciated on the current CT. The ground-glass opacity noted in the lower lungs on the current CT is not well appreciated radiographically. No new lung abnormalities. No pleural effusion. No pneumothorax. Skeletal structures are unremarkable. Subtle soft tissue masses are evident along the axilla consistent with the adenopathy noted on the current CT. IMPRESSION: 1. Lung opacities noted on the current CT are not as well-defined radiographically, only subtly evident. No new lung abnormalities. 2. Mediastinal and hilar adenopathy as well as axillary adenopathy is also less well-defined than it was on the current CT. Electronically Signed   By: Lajean Manes M.D.   On: 09/22/2017 17:40   Ct Chest W Contrast  Result Date: 09/22/2017 CLINICAL DATA:  56 year old female with increasing shortness of breath for the past 3 days. History of chronic cough. Evaluate for pneumonia. History of chronic lymphocytic leukemia (CLL) and atrial fibrillation. EXAM: CT CHEST WITH CONTRAST TECHNIQUE: Multidetector CT imaging of the chest was performed during intravenous contrast administration. CONTRAST:   35mL ISOVUE-300 IOPAMIDOL (ISOVUE-300) INJECTION 61% COMPARISON:  PET-CT 09/18/2017. FINDINGS: Cardiovascular: Heart size is borderline enlarged. There is no significant pericardial fluid, thickening or pericardial calcification. There is aortic atherosclerosis, as well as atherosclerosis of the great vessels of the mediastinum and the coronary arteries, including calcified atherosclerotic plaque in the left anterior descending, left circumflex and right coronary arteries. Dilatation of the pulmonic trunk (4.1 cm in diameter), concerning for pulmonary arterial hypertension. Mediastinum/Nodes: Extensive mediastinal and bilateral hilar lymphadenopathy, similar to recent PET-CT, with the largest lymph nodes measuring up to 18 mm in short axis in the right hilar region. Esophagus is unremarkable in appearance. Numerous enlarged axillary lymph nodes bilaterally measuring up to 17 mm in short axis on the left and 18 mm in short axis on the right. Lungs/Pleura: As with the recent PET-CT there is extensive patchy ground-glass attenuation scattered throughout the lungs bilaterally, the severity of which is very similar to the prior study. A few scattered areas of more dense peribronchovascular airspace consolidation are also noted, slightly increased compared to the recent examination. No pleural effusions. No definite discrete suspicious pulmonary nodules or masses. Upper Abdomen: Spleen is incompletely visualized but appears likely enlarged measuring up to of 4.0 x 12.0 cm Musculoskeletal: There are no aggressive appearing lytic or blastic lesions noted in the visualized portions of the skeleton. IMPRESSION: 1. Unusual appearance of the lung parenchyma with progressive ground-glass attenuation and developing areas of patchy peribronchovascular consolidation throughout the lungs bilaterally. 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 Alternatively, findings may  simply reflect evolving atypical infection in the setting of underlying CLL, or are less likely related to direct pulmonic leukemic infiltration. 2. Widespread mediastinal, bilateral hilar and bilateral axillary lymphadenopathy related to underlying CLL. Probable splenomegaly also noted. 3. Aortic atherosclerosis,  in addition to three-vessel coronary artery disease. Please note that although the presence of coronary artery calcium documents the presence of coronary artery disease, the severity of this disease and any potential stenosis cannot be assessed on this non-gated CT examination. Assessment for potential risk factor modification, dietary therapy or pharmacologic therapy may be warranted, if clinically indicated. 4. Dilatation of the pulmonic trunk (4.1 cm in diameter), concerning for pulmonary arterial hypertension. Aortic Atherosclerosis (ICD10-I70.0). Electronically Signed   By: Vinnie Langton M.D.   On: 09/22/2017 12:02   Labs/CT obtained earlier by PCP.  Procedures Procedures (including critical care time)  Medications Ordered in ED Medications  vancomycin (VANCOCIN) 1,500 mg in sodium chloride 0.9 % 500 mL IVPB (not administered)  vancomycin (VANCOCIN) IVPB 1000 mg/200 mL premix (not administered)  ceFEPIme (MAXIPIME) 2 g in dextrose 5 % 50 mL IVPB (not administered)  sodium chloride 0.9 % bolus 1,000 mL (1,000 mLs Intravenous New Bag/Given 09/22/17 1953)  ceFEPIme (MAXIPIME) 1 g in dextrose 5 % 50 mL IVPB (1 g Intravenous New Bag/Given 09/22/17 2012)     Initial Impression / Assessment and Plan / ED Course  I have reviewed the triage vital signs and the nursing notes.  Pertinent labs & imaging results that were available during my care of the patient were reviewed by me and considered in my medical decision making (see chart for details).  Clinical Course as of Sep 22 2045  Tue Sep 22, 2017  6962 Spoke with oncology who does not feel like this is leukocytosis, suspects  infection.   [EH]  2034 Code sepsis reassessment complete.  [EH]  2046 Spoke with Dr. Myna Hidalgo who will come see patient.   [EH]    Clinical Course User Index [EH] Lorin Glass, PA-C   Audrey Gross presents today for evaluation for gradually worsening shortness of breath.  Her PCP saw her earlier this morning and ordered a CT with contrast of chest.  She was found to be hypoxic upon arrival in the emergency room satting at 75% on room air.  She was started on oxygen.  She was tachycardic up to 133 on arrival, and febrile with a rectal temp of 101.  Her white count is markedly elevated at 140.6 which appears to be consistent with her recent labs secondary to her CLL.  Lactic acid was obtained, 1.96.  Fluids were ordered, along with cefepime and vancomycin as she has enough hospital exposure to qualify her for HCAP.  Code sepsis was called.  PE was considered, however given fever along with cough recently I suspect that her symptoms are most likely due to pneumonia rather than a PE.   I spoke with Dr. Myna Hidalgo who agreed to come and see the patient for admission.  The patient was discussed with Dr. Marcha Dutton.   The patient appears reasonably stabilized for admission considering the current resources, flow, and capabilities available in the ED at this time, and I doubt any other [facility] requiring further screening and/or treatment in the ED prior to admission.   Final Clinical Impressions(s) / ED Diagnoses   Final diagnoses:  HCAP (healthcare-associated pneumonia)  Pneumonia of both lungs due to infectious organism, unspecified part of lung  CLL (chronic lymphocytic leukemia) [facility])    ED Discharge Orders    None       Ollen Gross 09/22/17 2047    Pixie Casino, MD 09/22/17 2049

## 2017-09-22 NOTE — Telephone Encounter (Signed)
No new orders per 12/17 los.

## 2017-09-23 ENCOUNTER — Encounter: Payer: Self-pay | Admitting: Hematology and Oncology

## 2017-09-23 ENCOUNTER — Other Ambulatory Visit: Payer: Self-pay

## 2017-09-23 DIAGNOSIS — R0602 Shortness of breath: Secondary | ICD-10-CM | POA: Diagnosis present

## 2017-09-23 LAB — BASIC METABOLIC PANEL
ANION GAP: 9 (ref 5–15)
BUN: 14 mg/dL (ref 6–20)
CALCIUM: 8.6 mg/dL — AB (ref 8.9–10.3)
CO2: 19 mmol/L — ABNORMAL LOW (ref 22–32)
CREATININE: 0.83 mg/dL (ref 0.44–1.00)
Chloride: 111 mmol/L (ref 101–111)
Glucose, Bld: 118 mg/dL — ABNORMAL HIGH (ref 65–99)
Potassium: 4.9 mmol/L (ref 3.5–5.1)
SODIUM: 139 mmol/L (ref 135–145)

## 2017-09-23 LAB — PATHOLOGIST SMEAR REVIEW

## 2017-09-23 LAB — RESPIRATORY PANEL BY PCR
Adenovirus: NOT DETECTED
BORDETELLA PERTUSSIS-RVPCR: NOT DETECTED
CORONAVIRUS 229E-RVPPCR: NOT DETECTED
CORONAVIRUS OC43-RVPPCR: NOT DETECTED
Chlamydophila pneumoniae: NOT DETECTED
Coronavirus HKU1: NOT DETECTED
Coronavirus NL63: NOT DETECTED
INFLUENZA A-RVPPCR: NOT DETECTED
INFLUENZA B-RVPPCR: NOT DETECTED
METAPNEUMOVIRUS-RVPPCR: NOT DETECTED
Mycoplasma pneumoniae: NOT DETECTED
PARAINFLUENZA VIRUS 1-RVPPCR: NOT DETECTED
PARAINFLUENZA VIRUS 2-RVPPCR: NOT DETECTED
PARAINFLUENZA VIRUS 4-RVPPCR: NOT DETECTED
Parainfluenza Virus 3: NOT DETECTED
RESPIRATORY SYNCYTIAL VIRUS-RVPPCR: NOT DETECTED
Rhinovirus / Enterovirus: NOT DETECTED

## 2017-09-23 LAB — CBC WITH DIFFERENTIAL/PLATELET
BLASTS: 0 %
Band Neutrophils: 0 %
Basophils Absolute: 0 10*3/uL (ref 0.0–0.1)
Basophils Relative: 0 %
EOS ABS: 0 10*3/uL (ref 0.0–0.7)
Eosinophils Relative: 0 %
HEMATOCRIT: 31.1 % — AB (ref 36.0–46.0)
HEMOGLOBIN: 10 g/dL — AB (ref 12.0–15.0)
LYMPHS PCT: 93 %
Lymphs Abs: 100.8 10*3/uL — ABNORMAL HIGH (ref 0.7–4.0)
MCH: 27.5 pg (ref 26.0–34.0)
MCHC: 32.2 g/dL (ref 30.0–36.0)
MCV: 85.7 fL (ref 78.0–100.0)
MYELOCYTES: 0 %
Metamyelocytes Relative: 0 %
Monocytes Absolute: 0 10*3/uL — ABNORMAL LOW (ref 0.1–1.0)
Monocytes Relative: 0 %
NEUTROS ABS: 7.6 10*3/uL (ref 1.7–7.7)
NEUTROS PCT: 7 %
NRBC: 0 /100{WBCs}
PROMYELOCYTES ABS: 0 %
Platelets: 245 10*3/uL (ref 150–400)
RBC: 3.63 MIL/uL — ABNORMAL LOW (ref 3.87–5.11)
RDW: 17.6 % — AB (ref 11.5–15.5)
WBC: 108.4 10*3/uL — AB (ref 4.0–10.5)

## 2017-09-23 LAB — HIV ANTIBODY (ROUTINE TESTING W REFLEX): HIV SCREEN 4TH GENERATION: NONREACTIVE

## 2017-09-23 LAB — STREP PNEUMONIAE URINARY ANTIGEN: STREP PNEUMO URINARY ANTIGEN: NEGATIVE

## 2017-09-23 MED ORDER — PREDNISONE 20 MG PO TABS
40.0000 mg | ORAL_TABLET | Freq: Every day | ORAL | Status: DC
  Administered 2017-09-23 – 2017-09-26 (×4): 40 mg via ORAL
  Filled 2017-09-23 (×4): qty 2

## 2017-09-23 MED ORDER — VENETOCLAX 10 MG PO TABS: 10.0000 mg | ORAL_TABLET | Freq: Every day | ORAL | 0 refills | Status: DC

## 2017-09-23 MED ORDER — DILTIAZEM HCL ER COATED BEADS 240 MG PO CP24
240.0000 mg | ORAL_CAPSULE | Freq: Every day | ORAL | Status: DC
  Administered 2017-09-23 – 2017-09-28 (×6): 240 mg via ORAL
  Filled 2017-09-23 (×5): qty 1
  Filled 2017-09-23: qty 2

## 2017-09-23 MED ORDER — ORAL CARE MOUTH RINSE
15.0000 mL | Freq: Two times a day (BID) | OROMUCOSAL | Status: DC
  Administered 2017-09-23 – 2017-09-28 (×6): 15 mL via OROMUCOSAL

## 2017-09-23 MED FILL — VENCLEXTA 10 MG TABS: 10 | 8 days supply | Qty: 8 | Fill #0

## 2017-09-23 NOTE — Progress Notes (Signed)
PROGRESS NOTE    Audrey Gross  KXF:[id number] DOB: Aug 03, 1961 DOA: 09/22/2017 PCP: Janith Lima, MD    Brief Narrative:  Pt is a 56 y/o with history of PAF on Eliquis, refractory CLL, chronic systolic CHF, and htn. Presented to the hospital with 3 day complaint of worsening SOB. Oncologist reviewed case and currently suspects worsening CLL and tumor burden. However patient started on antibiotics for possible superimposed pna.   Assessment & Plan:   Principal Problem:  Acute respiratory failure with hypoxia (HCC) - At this point will continue supplemental oxygen. Discussed with Oncologist who believes her symptoms are most consistent with tumor burden from CLL. However given lactic acidosis would like to continue antibiotics for now    Pneumonia - see discussion above - will continue antibiotics for now but suspect these may be discontinued soon as per my discussion with oncologist her symptoms seem to be more consistent with CLL  Active Problems:   CLL (chronic lymphocytic leukemia) ([place]) - Plan is to start chemotherapy at [facility]    Hypertension - Pt is on B blocker, entresto (Serum creatinine 0.83 on last check), and cardizem    PAF (paroxysmal atrial fibrillation) (HCC) - Pt has not received her cardizem. Placed order to be administered sooner than 10 am as patient is hypertensive and tachycardic. Hence transition to telemetry for now. This may be downgraded if patient's HR is controlled on her long acting cardizem. Also continue B blocker. - Chands-vasc is 3 (Hypertension, gender, CHF) pt is on eliquis    Chronic systolic heart failure ([place]) - continue B blocker and entresto  - appears compensated.   DVT prophylaxis: Eliquis Code Status: Full Family Communication: d/c spouse at bedside Disposition Plan: Plan is to transition to [facility] to be seen by oncology. Pt still tachycardic upon review so will transition to telemetry for now.    Consultants:    Oncology: Dr.  Alvy Bimler   Procedures: none   Antimicrobials: Acyclovir, Cefepime, Vancomycin   Subjective: Pt states that with activity she is more short of breath.  Objective: Vitals:   09/23/17 0500 09/23/17 0600 09/23/17 0800 09/23/17 0845  BP: 128/79 126/70 (!) 145/87   Pulse: 85 86 (!) 105 (!) 116  Resp: (!) 32 (!) 37 (!) 31 (!) 27  Temp:      TempSrc:      SpO2: 95% 91% 96% 95%  Weight:      Height:        Intake/Output Summary (Last 24 hours) at 09/23/2017 0933 Last data filed at 09/23/2017 0847 Gross per 24 hour  Intake 1850 ml  Output -  Net 1850 ml   Filed Weights   09/22/17 1617 09/22/17 2020  Weight: 83.9 kg (185 lb) 83.9 kg (185 lb)    Examination:  General exam: Appears calm and comfortable, in nad. Respiratory system: tachypnea with no accessory muscle use, no wheezes, rhales Cardiovascular system: S1 & S2 heard, RRR. No JVD, murmurs, rubs, no gallops Gastrointestinal system: Abdomen is nondistended, soft and nontender. No organomegaly or masses felt. Normal bowel sounds heard. Central nervous system: Alert and oriented. No focal neurological deficits. Extremities: warm, no deformities  Skin: No rashes, lesions or ulcers, on limited exam. Psychiatry:  Mood & affect appropriate.     Data Reviewed: I have personally reviewed following labs and imaging studies  CBC: Recent Labs  Lab 09/21/17 0950 09/22/17 1638 09/23/17 0333  WBC 144.2* 140.6* 108.4*  NEUTROABS  --  7.0 7.6  HGB 12.8  11.8* 10.0*  HCT 39.8 36.4 31.1*  MCV 85.8 86.9 85.7  PLT 235 278 967   Basic Metabolic Panel: Recent Labs  Lab 09/21/17 0950 09/22/17 0945 09/22/17 1638 09/23/17 0333  NA 140 142 137 139  K 3.2* 3.6 4.9 4.9  CL  --  107 107 111  CO2 16* 24 23 19*  GLUCOSE 141* 102* 100* 118*  BUN 21.1 19 15 14   CREATININE 1.1 1.05 1.07* 0.83  CALCIUM 10.1 9.5 9.7 8.6*   GFR: Estimated Creatinine Clearance: 84.2 mL/min (by C-G formula based on SCr of 0.83 mg/dL). Liver  Function Tests: Recent Labs  Lab 09/21/17 0950 09/22/17 1638  AST 19 37  ALT 11 13*  ALKPHOS 65 57  BILITOT 0.92 0.8  PROT 6.5 5.8*  ALBUMIN 3.5 3.3*   No results for input(s): LIPASE, AMYLASE in the last 168 hours. No results for input(s): AMMONIA in the last 168 hours. Coagulation Profile: No results for input(s): INR, PROTIME in the last 168 hours. Cardiac Enzymes: No results for input(s): CKTOTAL, CKMB, CKMBINDEX, TROPONINI in the last 168 hours. BNP (last 3 results) No results for input(s): PROBNP in the last 8760 hours. HbA1C: No results for input(s): HGBA1C in the last 72 hours. CBG: Recent Labs  Lab 09/18/17 0707  GLUCAP 97   Lipid Profile: No results for input(s): CHOL, HDL, LDLCALC, TRIG, CHOLHDL, LDLDIRECT in the last 72 hours. Thyroid Function Tests: Recent Labs    09/22/17 0945  TSH 0.05*  T4TOTAL 11.4   Anemia Panel: No results for input(s): VITAMINB12, FOLATE, FERRITIN, TIBC, IRON, RETICCTPCT in the last 72 hours. Sepsis Labs: Recent Labs  Lab 09/22/17 1940 09/22/17 2057  LATICACIDVEN 1.96* 1.57    Recent Results (from the past 240 hour(s))  TECHNOLOGIST REVIEW     Status: None   Collection Time: 09/21/17  9:50 AM  Result Value Ref Range Status   Technologist Review Variant lymphs present, smudge cells  Final         Radiology Studies: Dg Chest 2 View  Result Date: 09/22/2017 CLINICAL DATA:  Sob. Pt has been having sob for 3 days w/ a dry cough. She said sometimes she'll cough until she feels like throwing up, but doesn't. Pt stated it just feels like her sob and cough are getting worse. Pt displayed heavy breathing after talking for hx and following respirations for exam. Hx of A-fib, cystic breast, diffuse cystic mastopathy, HTN, mitral regurgitation, persistent A-fib w/ rapid ventricular response EXAM: CHEST  2 VIEW COMPARISON:  Chest CT, 09/22/2017.  Chest radiograph, 07/15/2017. FINDINGS: Mild enlargement of the cardiac silhouette,  stable. Mild thickening along the right peritracheal stripe. Mild prominence of the hila. This reflects the adenopathy better seen on the current CT. Subtle patchy hazy opacities are noted in the lungs, most evident in the left mid to upper lung on the frontal view. This was also better appreciated on the current CT. The ground-glass opacity noted in the lower lungs on the current CT is not well appreciated radiographically. No new lung abnormalities. No pleural effusion. No pneumothorax. Skeletal structures are unremarkable. Subtle soft tissue masses are evident along the axilla consistent with the adenopathy noted on the current CT. IMPRESSION: 1. Lung opacities noted on the current CT are not as well-defined radiographically, only subtly evident. No new lung abnormalities. 2. Mediastinal and hilar adenopathy as well as axillary adenopathy is also less well-defined than it was on the current CT. Electronically Signed   By: Dedra Skeens.D.  On: 09/22/2017 17:40   Ct Chest W Contrast  Result Date: 09/22/2017 CLINICAL DATA:  56 year old female with increasing shortness of breath for the past 3 days. History of chronic cough. Evaluate for pneumonia. History of chronic lymphocytic leukemia (CLL) and atrial fibrillation. EXAM: CT CHEST WITH CONTRAST TECHNIQUE: Multidetector CT imaging of the chest was performed during intravenous contrast administration. CONTRAST:  38mL ISOVUE-300 IOPAMIDOL (ISOVUE-300) INJECTION 61% COMPARISON:  PET-CT 09/18/2017. FINDINGS: Cardiovascular: Heart size is borderline enlarged. There is no significant pericardial fluid, thickening or pericardial calcification. There is aortic atherosclerosis, as well as atherosclerosis of the great vessels of the mediastinum and the coronary arteries, including calcified atherosclerotic plaque in the left anterior descending, left circumflex and right coronary arteries. Dilatation of the pulmonic trunk (4.1 cm in diameter), concerning for  pulmonary arterial hypertension. Mediastinum/Nodes: Extensive mediastinal and bilateral hilar lymphadenopathy, similar to recent PET-CT, with the largest lymph nodes measuring up to 18 mm in short axis in the right hilar region. Esophagus is unremarkable in appearance. Numerous enlarged axillary lymph nodes bilaterally measuring up to 17 mm in short axis on the left and 18 mm in short axis on the right. Lungs/Pleura: As with the recent PET-CT there is extensive patchy ground-glass attenuation scattered throughout the lungs bilaterally, the severity of which is very similar to the prior study. A few scattered areas of more dense peribronchovascular airspace consolidation are also noted, slightly increased compared to the recent examination. No pleural effusions. No definite discrete suspicious pulmonary nodules or masses. Upper Abdomen: Spleen is incompletely visualized but appears likely enlarged measuring up to of 4.0 x 12.0 cm Musculoskeletal: There are no aggressive appearing lytic or blastic lesions noted in the visualized portions of the skeleton. IMPRESSION: 1. Unusual appearance of the lung parenchyma with progressive ground-glass attenuation and developing areas of patchy peribronchovascular consolidation throughout the lungs bilaterally. 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 Alternatively, findings may simply reflect evolving atypical infection in the setting of underlying CLL, or are less likely related to direct pulmonic leukemic infiltration. 2. Widespread mediastinal, bilateral hilar and bilateral axillary lymphadenopathy related to underlying CLL. Probable splenomegaly also noted. 3. Aortic atherosclerosis, in addition to three-vessel coronary artery disease. Please note that although the presence of coronary artery calcium documents the presence of coronary artery disease, the severity of this disease and any potential stenosis  cannot be assessed on this non-gated CT examination. Assessment for potential risk factor modification, dietary therapy or pharmacologic therapy may be warranted, if clinically indicated. 4. Dilatation of the pulmonic trunk (4.1 cm in diameter), concerning for pulmonary arterial hypertension. Aortic Atherosclerosis (ICD10-I70.0). Electronically Signed   By: Vinnie Langton M.D.   On: 09/22/2017 12:02        Scheduled Meds: . acyclovir  400 mg Oral Daily  . allopurinol  300 mg Oral Daily  . apixaban  5 mg Oral BID  . cholecalciferol  1,000 Units Oral Q breakfast  . diltiazem  240 mg Oral Daily  . metoprolol tartrate  37.5 mg Oral BID  . predniSONE  40 mg Oral Q breakfast  . sacubitril-valsartan  1 tablet Oral BID  . sodium chloride flush  3 mL Intravenous Q12H   Continuous Infusions: . sodium chloride    . azithromycin Stopped (09/23/17 0159)  . ceFEPime (MAXIPIME) IV Stopped (09/23/17 0847)  . vancomycin       LOS: 1 day    Time spent: > 35 minutes  Velvet Bathe, MD [facility]  Pager [phone]  If 7PM-7AM, please contact night-coverage [email or web address] Password [facility] 09/23/2017, 9:33 AM

## 2017-09-23 NOTE — Progress Notes (Signed)
Noted the patient is admitted to the hospital for treatment of pneumonia I clarified the plan of care with the hospitalist Her shortness of breath, abnormal imaging study, etc. is due to leukemic infiltration from CLL rather than infection I requested the patient to be transfer from [facility] to [place] [facility], preferably [facility] oncology to expedite treatment The plan will be to get her transferred to hospitalist service, preferably on [facility]. She will continue steroids but with increased dose I will see her once transfer is completed

## 2017-09-24 ENCOUNTER — Telehealth: Payer: Self-pay | Admitting: Hematology and Oncology

## 2017-09-24 ENCOUNTER — Telehealth: Payer: Self-pay | Admitting: *Deleted

## 2017-09-24 DIAGNOSIS — Z7901 Long term (current) use of anticoagulants: Secondary | ICD-10-CM

## 2017-09-24 DIAGNOSIS — R0602 Shortness of breath: Secondary | ICD-10-CM

## 2017-09-24 DIAGNOSIS — R509 Fever, unspecified: Secondary | ICD-10-CM

## 2017-09-24 DIAGNOSIS — I482 Chronic atrial fibrillation: Secondary | ICD-10-CM

## 2017-09-24 DIAGNOSIS — C9110 Chronic lymphocytic leukemia of B-cell type not having achieved remission: Secondary | ICD-10-CM

## 2017-09-24 LAB — COMPREHENSIVE METABOLIC PANEL
ALBUMIN: 3.2 g/dL — AB (ref 3.5–5.0)
ALBUMIN: 3.2 g/dL — AB (ref 3.5–5.0)
ALK PHOS: 49 U/L (ref 38–126)
ALK PHOS: 57 U/L (ref 38–126)
ALK PHOS: 54 U/L (ref 38–126)
ALT: 10 U/L — AB (ref 14–54)
ALT: 14 U/L (ref 14–54)
ALT: 14 U/L (ref 14–54)
ALT: 14 U/L (ref 14–54)
ANION GAP: 9 (ref 5–15)
ANION GAP: 7 (ref 5–15)
AST: 29 U/L (ref 15–41)
AST: 40 U/L (ref 15–41)
AST: 32 U/L (ref 15–41)
AST: 35 U/L (ref 15–41)
Albumin: 2.9 g/dL — ABNORMAL LOW (ref 3.5–5.0)
Albumin: 3 g/dL — ABNORMAL LOW (ref 3.5–5.0)
Alkaline Phosphatase: 52 U/L (ref 38–126)
Anion gap: 8 (ref 5–15)
Anion gap: 9 (ref 5–15)
BILIRUBIN TOTAL: 0.9 mg/dL (ref 0.3–1.2)
BILIRUBIN TOTAL: 0.6 mg/dL (ref 0.3–1.2)
BUN: 17 mg/dL (ref 6–20)
BUN: 16 mg/dL (ref 6–20)
BUN: 21 mg/dL — ABNORMAL HIGH (ref 6–20)
BUN: 20 mg/dL (ref 6–20)
CALCIUM: 9.1 mg/dL (ref 8.9–10.3)
CALCIUM: 9.4 mg/dL (ref 8.9–10.3)
CALCIUM: 9.3 mg/dL (ref 8.9–10.3)
CHLORIDE: 111 mmol/L (ref 101–111)
CHLORIDE: 108 mmol/L (ref 101–111)
CO2: 20 mmol/L — AB (ref 22–32)
CO2: 19 mmol/L — ABNORMAL LOW (ref 22–32)
CO2: 21 mmol/L — ABNORMAL LOW (ref 22–32)
CO2: 21 mmol/L — AB (ref 22–32)
CREATININE: 0.81 mg/dL (ref 0.44–1.00)
CREATININE: 0.85 mg/dL (ref 0.44–1.00)
Calcium: 9.1 mg/dL (ref 8.9–10.3)
Chloride: 109 mmol/L (ref 101–111)
Chloride: 108 mmol/L (ref 101–111)
Creatinine, Ser: 0.87 mg/dL (ref 0.44–1.00)
Creatinine, Ser: 0.97 mg/dL (ref 0.44–1.00)
GFR calc Af Amer: >60 mL/min (ref >60)
GFR calc Af Amer: >60 mL/min (ref >60)
GFR calc non Af Amer: >60 mL/min (ref >60)
GLUCOSE: 108 mg/dL — AB (ref 65–99)
GLUCOSE: 175 mg/dL — AB (ref 65–99)
Glucose, Bld: 156 mg/dL — ABNORMAL HIGH (ref 65–99)
Glucose, Bld: 192 mg/dL — ABNORMAL HIGH (ref 65–99)
POTASSIUM: 4.2 mmol/L (ref 3.5–5.1)
POTASSIUM: 4.4 mmol/L (ref 3.5–5.1)
POTASSIUM: 4.4 mmol/L (ref 3.5–5.1)
Potassium: 3.2 mmol/L — ABNORMAL LOW (ref 3.5–5.1)
SODIUM: 139 mmol/L (ref 135–145)
SODIUM: 136 mmol/L (ref 135–145)
Sodium: 137 mmol/L (ref 135–145)
Sodium: 138 mmol/L (ref 135–145)
TOTAL PROTEIN: 6.3 g/dL — AB (ref 6.5–8.1)
TOTAL PROTEIN: 6.1 g/dL — AB (ref 6.5–8.1)
Total Bilirubin: 1.2 mg/dL (ref 0.3–1.2)
Total Bilirubin: 1.1 mg/dL (ref 0.3–1.2)
Total Protein: 5.4 g/dL — ABNORMAL LOW (ref 6.5–8.1)
Total Protein: 5.8 g/dL — ABNORMAL LOW (ref 6.5–8.1)

## 2017-09-24 LAB — CBC WITH DIFFERENTIAL/PLATELET
BASOS PCT: 0 %
Basophils Absolute: 0 10*3/uL (ref 0.0–0.1)
EOS ABS: 3.3 10*3/uL — AB (ref 0.0–0.7)
EOS PCT: 3 %
HCT: 30.2 % — ABNORMAL LOW (ref 36.0–46.0)
HEMOGLOBIN: 9.9 g/dL — AB (ref 12.0–15.0)
LYMPHS PCT: 84 %
Lymphs Abs: 93.5 10*3/uL — ABNORMAL HIGH (ref 0.7–4.0)
MCH: 27.9 pg (ref 26.0–34.0)
MCHC: 32.8 g/dL (ref 30.0–36.0)
MCV: 85.1 fL (ref 78.0–100.0)
MONO ABS: 3.3 10*3/uL — AB (ref 0.1–1.0)
Monocytes Relative: 3 %
NEUTROS PCT: 10 %
Neutro Abs: 11.1 10*3/uL — ABNORMAL HIGH (ref 1.7–7.7)
PLATELETS: 259 10*3/uL (ref 150–400)
RBC: 3.55 MIL/uL — AB (ref 3.87–5.11)
RDW: 17.6 % — ABNORMAL HIGH (ref 11.5–15.5)
WBC: 111.2 10*3/uL — AB (ref 4.0–10.5)

## 2017-09-24 LAB — PHOSPHORUS
PHOSPHORUS: 3.3 mg/dL (ref 2.5–4.6)
PHOSPHORUS: 2.8 mg/dL (ref 2.5–4.6)
Phosphorus: 3.8 mg/dL (ref 2.5–4.6)
Phosphorus: 3.1 mg/dL (ref 2.5–4.6)

## 2017-09-24 LAB — LACTATE DEHYDROGENASE: LDH: 465 U/L — AB (ref 98–192)

## 2017-09-24 LAB — URIC ACID
URIC ACID, SERUM: 3.1 mg/dL (ref 2.3–6.6)
URIC ACID, SERUM: 3.1 mg/dL (ref 2.3–6.6)
Uric Acid, Serum: 3.3 mg/dL (ref 2.3–6.6)
Uric Acid, Serum: 2.9 mg/dL (ref 2.3–6.6)

## 2017-09-24 MED ORDER — ACETAMINOPHEN 325 MG PO TABS
650.0000 mg | ORAL_TABLET | ORAL | Status: DC | PRN
  Administered 2017-09-24 – 2017-09-25 (×2): 650 mg via ORAL
  Filled 2017-09-24 (×2): qty 2

## 2017-09-24 MED ORDER — VENETOCLAX 10 MG PO TABS
10.0000 mg | ORAL_TABLET | Freq: Every day | ORAL | Status: DC
  Administered 2017-09-24: 1 via ORAL
  Administered 2017-09-25: 10 mg via ORAL

## 2017-09-24 MED ORDER — ONDANSETRON HCL 4 MG/2ML IJ SOLN
4.0000 mg | Freq: Four times a day (QID) | INTRAMUSCULAR | Status: DC | PRN
  Administered 2017-09-24 – 2017-09-28 (×3): 4 mg via INTRAVENOUS
  Filled 2017-09-24 (×3): qty 2

## 2017-09-24 MED ORDER — FUROSEMIDE 40 MG PO TABS
40.0000 mg | ORAL_TABLET | Freq: Every day | ORAL | Status: DC
  Administered 2017-09-24 – 2017-09-25 (×2): 40 mg via ORAL
  Filled 2017-09-24 (×2): qty 1

## 2017-09-24 MED ORDER — SODIUM CHLORIDE 0.9 % IV BOLUS (SEPSIS)
500.0000 mL | Freq: Once | INTRAVENOUS | Status: AC
  Administered 2017-09-24: 500 mL via INTRAVENOUS

## 2017-09-24 MED ORDER — SODIUM CHLORIDE 0.9% FLUSH
10.0000 mL | INTRAVENOUS | Status: DC | PRN
  Administered 2017-09-25: 10 mL
  Administered 2017-09-26 – 2017-09-28 (×2): 20 mL
  Filled 2017-09-24 (×3): qty 40

## 2017-09-24 MED ORDER — FUROSEMIDE 40 MG PO TABS: 40.0000 mg | ORAL_TABLET | Freq: Every day | ORAL | Status: DC

## 2017-09-24 MED ORDER — GUAIFENESIN-DM 100-10 MG/5ML PO SYRP
5.0000 mL | ORAL_SOLUTION | ORAL | Status: DC | PRN
  Administered 2017-09-24: 5 mL via ORAL
  Filled 2017-09-24: qty 10

## 2017-09-24 NOTE — Telephone Encounter (Signed)
09/24/17 @ 8:15 am called patient @ [phone] to inform her that her Attending Provider Statement was filled out and successfully faxed to [facility] @ [phone].  She also requested to pick-up a copy at the front desk reception area for her personal records.

## 2017-09-24 NOTE — Progress Notes (Signed)
Critical lab: WBC = 111.2 Notified MD: Dr. Venetia Constable at [phone]

## 2017-09-24 NOTE — Care Management Note (Signed)
Case Management Note  Patient Details  Name: Audrey Gross MRN: [id number] Date of Birth: 01-24-1961  Subjective/Objective:                  sepsis  Action/Plan: Date: September 24, 2017 Raelynn Corron, BSN, [place], [place] Chart and notes review for patient progress and needs. Will follow for case management and discharge needs. Next review date: [id number]  Expected Discharge Date:  (unknown)               Expected Discharge Plan:  Home/Self Care  In-House Referral:     Discharge planning Services  CM Consult  Post Acute Care Choice:    Choice offered to:     DME Arranged:    DME Agency:     HH Arranged:    HH Agency:     Status of Service:  In process, will continue to follow  If discussed at Long Length of Stay Meetings, dates discussed:    Additional Comments:  Candies, [place], RN 09/24/2017, 8:24 AM

## 2017-09-24 NOTE — Progress Notes (Signed)
Peripherally Inserted Central Catheter/Midline Placement  The IV Nurse has discussed with the patient and/or persons authorized to consent for the patient, the purpose of this procedure and the potential benefits and risks involved with this procedure.  The benefits include less needle sticks, lab draws from the catheter, and the patient may be discharged home with the catheter. Risks include, but not limited to, infection, bleeding, blood clot (thrombus formation), and puncture of an artery; nerve damage and irregular heartbeat and possibility to perform a PICC exchange if needed/ordered by physician.  Alternatives to this procedure were also discussed.  Bard Power PICC patient education guide, fact sheet on infection prevention and patient information card has been provided to patient /or left at bedside.    PICC/Midline Placement Documentation  PICC Double Lumen 09/24/17 PICC Right Brachial 38 cm 0 cm (Active)  Indication for Insertion or Continuance of Line Home intravenous therapies (PICC only) 09/24/2017  7:00 PM  Exposed Catheter (cm) 0 cm 09/24/2017  7:00 PM  Site Assessment Clean;Dry;Intact 09/24/2017  7:00 PM  Lumen #1 Status Flushed;Saline locked;Blood return noted 09/24/2017  7:00 PM  Lumen #2 Status Flushed;Saline locked;Blood return noted 09/24/2017  7:00 PM  Dressing Type Transparent;Securing device 09/24/2017  7:00 PM  Dressing Change Due 10/01/17 09/24/2017  7:00 PM       Frances Maywood 09/24/2017, 7:18 PM

## 2017-09-24 NOTE — Telephone Encounter (Signed)
-----   Message from Sueanne Margarita, MD sent at 09/21/2017  9:28 PM EST ----- Please let patient know that sleep study showed no significant sleep apnea.

## 2017-09-24 NOTE — Progress Notes (Signed)
Audrey Gross   DOB:1961-08-16   SW#:[id number]    Subjective: She continues to feel unwell with shortness of breath, recurrent fevers but denies cough.  She is comfortable to proceed with chemotherapy as planned  Objective:  Vitals:   09/24/17 0408 09/24/17 0618  BP: 136/73   Pulse: (!) 110   Resp: 20   Temp: (!) 101.3 F (38.5 C) 99.4 F (37.4 C)  SpO2:       Intake/Output Summary (Last 24 hours) at 09/24/2017 0830 Last data filed at 09/24/2017 0801 Gross per 24 hour  Intake 800 ml  Output -  Net 800 ml    GENERAL:alert, no distress and comfortable.  She looks mildly cushingoid SKIN: skin color, texture, turgor are normal, no rashes or significant lesions EYES: normal, Conjunctiva are pink and non-injected, sclera clear OROPHARYNX:no exudate, no erythema and lips, buccal mucosa, and tongue normal  NECK: supple, thyroid normal size, non-tender, without nodularity LYMPH: She has palpable lymphadenopathy in her neck and axilla inal LUNGS: Increased breathing effort but clear on auscultation  hEART: Tachycardia, no murmurs and no lower extremity edema ABDOMEN:abdomen soft, non-tender and normal bowel sounds Musculoskeletal:no cyanosis of digits and no clubbing  NEURO: alert & oriented x 3 with fluent speech, no focal motor/sensory deficits   Labs:  Lab Results  Component Value Date   WBC 108.4 (HH) 09/23/2017   HGB 10.0 (L) 09/23/2017   HCT 31.1 (L) 09/23/2017   MCV 85.7 09/23/2017   PLT 245 09/23/2017   NEUTROABS 7.6 09/23/2017    Lab Results  Component Value Date   NA 139 09/23/2017   K 4.9 09/23/2017   CL 111 09/23/2017   CO2 19 (L) 09/23/2017    Studies:  Dg Chest 2 View  Result Date: 09/22/2017 CLINICAL DATA:  Sob. Pt has been having sob for 3 days w/ a dry cough. She said sometimes she'll cough until she feels like throwing up, but doesn't. Pt stated it just feels like her sob and cough are getting worse. Pt displayed heavy breathing after talking for hx  and following respirations for exam. Hx of A-fib, cystic breast, diffuse cystic mastopathy, HTN, mitral regurgitation, persistent A-fib w/ rapid ventricular response EXAM: CHEST  2 VIEW COMPARISON:  Chest CT, 09/22/2017.  Chest radiograph, 07/15/2017. FINDINGS: Mild enlargement of the cardiac silhouette, stable. Mild thickening along the right peritracheal stripe. Mild prominence of the hila. This reflects the adenopathy better seen on the current CT. Subtle patchy hazy opacities are noted in the lungs, most evident in the left mid to upper lung on the frontal view. This was also better appreciated on the current CT. The ground-glass opacity noted in the lower lungs on the current CT is not well appreciated radiographically. No new lung abnormalities. No pleural effusion. No pneumothorax. Skeletal structures are unremarkable. Subtle soft tissue masses are evident along the axilla consistent with the adenopathy noted on the current CT. IMPRESSION: 1. Lung opacities noted on the current CT are not as well-defined radiographically, only subtly evident. No new lung abnormalities. 2. Mediastinal and hilar adenopathy as well as axillary adenopathy is also less well-defined than it was on the current CT. Electronically Signed   By: Lajean Manes M.D.   On: 09/22/2017 17:40   Ct Chest W Contrast  Result Date: 09/22/2017 CLINICAL DATA:  56 year old female with increasing shortness of breath for the past 3 days. History of chronic cough. Evaluate for pneumonia. History of chronic lymphocytic leukemia (CLL) and atrial fibrillation. EXAM: CT  CHEST WITH CONTRAST TECHNIQUE: Multidetector CT imaging of the chest was performed during intravenous contrast administration. CONTRAST:  61mL ISOVUE-300 IOPAMIDOL (ISOVUE-300) INJECTION 61% COMPARISON:  PET-CT 09/18/2017. FINDINGS: Cardiovascular: Heart size is borderline enlarged. There is no significant pericardial fluid, thickening or pericardial calcification. There is aortic  atherosclerosis, as well as atherosclerosis of the great vessels of the mediastinum and the coronary arteries, including calcified atherosclerotic plaque in the left anterior descending, left circumflex and right coronary arteries. Dilatation of the pulmonic trunk (4.1 cm in diameter), concerning for pulmonary arterial hypertension. Mediastinum/Nodes: Extensive mediastinal and bilateral hilar lymphadenopathy, similar to recent PET-CT, with the largest lymph nodes measuring up to 18 mm in short axis in the right hilar region. Esophagus is unremarkable in appearance. Numerous enlarged axillary lymph nodes bilaterally measuring up to 17 mm in short axis on the left and 18 mm in short axis on the right. Lungs/Pleura: As with the recent PET-CT there is extensive patchy ground-glass attenuation scattered throughout the lungs bilaterally, the severity of which is very similar to the prior study. A few scattered areas of more dense peribronchovascular airspace consolidation are also noted, slightly increased compared to the recent examination. No pleural effusions. No definite discrete suspicious pulmonary nodules or masses. Upper Abdomen: Spleen is incompletely visualized but appears likely enlarged measuring up to of 4.0 x 12.0 cm Musculoskeletal: There are no aggressive appearing lytic or blastic lesions noted in the visualized portions of the skeleton. IMPRESSION: 1. Unusual appearance of the lung parenchyma with progressive ground-glass attenuation and developing areas of patchy peribronchovascular consolidation throughout the lungs bilaterally. 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 Alternatively, findings may simply reflect evolving atypical infection in the setting of underlying CLL, or are less likely related to direct pulmonic leukemic infiltration. 2. Widespread mediastinal, bilateral hilar and bilateral axillary lymphadenopathy related to  underlying CLL. Probable splenomegaly also noted. 3. Aortic atherosclerosis, in addition to three-vessel coronary artery disease. Please note that although the presence of coronary artery calcium documents the presence of coronary artery disease, the severity of this disease and any potential stenosis cannot be assessed on this non-gated CT examination. Assessment for potential risk factor modification, dietary therapy or pharmacologic therapy may be warranted, if clinically indicated. 4. Dilatation of the pulmonic trunk (4.1 cm in diameter), concerning for pulmonary arterial hypertension. Aortic Atherosclerosis (ICD10-I70.0). Electronically Signed   By: Vinnie Langton M.D.   On: 09/22/2017 12:02    Assessment & Plan:   Locally advanced CLL/small lymphocytic lymphoma We discussed the importance of proceeding with treatment because I believe her symptoms are related to leukemic infiltration from CLL.  We discussed the risks, benefits, side effects of venetoclax including high risk of tumor lysis syndrome, renal failure, allergic reaction and she agreed to proceed We have received approval and medication delivered.  I will get assistance from my outpatient pharmacist to coordinate pickup of medication and for her to stop the pill this morning Per protocol, I have ordered baseline CBC, CMP, LDH, uric acid and phosphorus level before we start treatment My pharmacist will assist me in coordinating blood count monitoring over the next 24 hours Due to poor venous access, I recommend PICC line placement She will continue prednisone therapy and mild IV fluid hydration  High risk tumor lysis syndrome She will continue allopurinol  Fever, abnormal imaging study, shortness of breath So far, cultures are negative I recommend continue antibiotic treatment for now. We will consider discontinuation of antibiotic if cultures  remain negative in 48 hours Continue oxygen therapy  History of chronic atrial  fibrillation and cardiomyopathy She has no signs of congestive heart failure now She will continue telemetry monitoring  Poor venous access I will order PICC line placement  CODE STATUS Full code  Discharge planning She will be here for at least 48 hours   Heath Lark, MD 09/24/2017  8:30 AM

## 2017-09-24 NOTE — Progress Notes (Signed)
Oral Chemotherapy Pharmacist Encounter   Spoke with patient in hospital room today to follow up regarding patient's oral chemotherapy medication: Venclexta  Start date of oral chemotherapy: 09/24/17  Venclexta will be initiated today at reduced dose due to drug interaction with diltiazem. Coordinated TLS monitoring labs and medication orders with inpatient pharmacy team.  Fluids reduced to [facility] and Lasix 40mg  daily started today per hospitalist due to presumed fluid overload. Hydration will be managed by MedOnc and hospitalist team.  Oral Oncology Clinic will continue to follow.  Thank you,  Johny Drilling, PharmD, BCPS, BCOP 09/24/2017 10:01 AM Oral Oncology Clinic [phone]

## 2017-09-24 NOTE — Progress Notes (Signed)
[facility] PROGRESS NOTE    Progress Note  Audrey Gross  YBO:[id number] DOB: Dec 23, 1960 DOA: 09/22/2017 PCP: Janith Lima, MD     Brief Narrative:   Audrey Gross is an 56 y.o. female [place] of paroxysmal atrial fibrillation on Eliquis, refractory CLL, chronic systolic heart failure and hypertension presents to the hospital with 3 days of worsening shortness of breath, oncology was consulted for suspected worsening CLL and tumor burden, will start empiric antibiotics for sepsis picture pneumonia.  Assessment/Plan:   Acute respiratory failure with hypoxia (HCC):/Pneumonia/CLL (chronic lymphocytic leukemia) ([place]) I personally discussed the case with her oncologist and she believes her shortness of breath is likely due to tumor burden from her CLL, her lactic acid is high probably due to her hypoxia. But will continue empiric antibiotics. The oncologist is plan to start chemotherapy while in the hospital, will continue to monitor for tumor lysis syndrome.  Essential hypertension: Continue beta blockers and Entresto.  PAF (paroxysmal atrial fibrillation) (HCC) Rate controlled continue Eliquis.  Chronic systolic heart failure (HCC) Seems to have a little bit of JVD, will start her on her oral Lasix.  Last 2D echo showed an ejection fraction of 30-35%. As there is a risk of volume overload, as she will be getting IV fluids to minimize the risk of tumor lysis syndrome.  When she is started on fluids we could start her on IV Lasix.  And continue to monitor ins and outs and basic metabolic panel.   DVT prophylaxis: lovenox Family Communication:none Disposition Plan/Barrier to D/C: unable to detemrine Code Status:     Code Status Orders  (From admission, onward)        Start     Ordered   09/22/17 2053  Full code  Continuous     09/22/17 2053    Code Status History    Date Active Date Inactive Code Status Order ID Comments User Context   07/15/2017 19:29 07/19/2017  18:11 Full Code [id number]  Eugenie Filler, MD Inpatient   02/26/2017 16:44 02/28/2017 19:46 Full Code [id number]  Delos Haring, PA-C ED        IV Access:    Peripheral IV   Procedures and diagnostic studies:   Dg Chest 2 View  Result Date: 09/22/2017 CLINICAL DATA:  Sob. Pt has been having sob for 3 days w/ a dry cough. She said sometimes she'll cough until she feels like throwing up, but doesn't. Pt stated it just feels like her sob and cough are getting worse. Pt displayed heavy breathing after talking for hx and following respirations for exam. Hx of A-fib, cystic breast, diffuse cystic mastopathy, HTN, mitral regurgitation, persistent A-fib w/ rapid ventricular response EXAM: CHEST  2 VIEW COMPARISON:  Chest CT, 09/22/2017.  Chest radiograph, 07/15/2017. FINDINGS: Mild enlargement of the cardiac silhouette, stable. Mild thickening along the right peritracheal stripe. Mild prominence of the hila. This reflects the adenopathy better seen on the current CT. Subtle patchy hazy opacities are noted in the lungs, most evident in the left mid to upper lung on the frontal view. This was also better appreciated on the current CT. The ground-glass opacity noted in the lower lungs on the current CT is not well appreciated radiographically. No new lung abnormalities. No pleural effusion. No pneumothorax. Skeletal structures are unremarkable. Subtle soft tissue masses are evident along the axilla consistent with the adenopathy noted on the current CT. IMPRESSION: 1. Lung opacities noted on the current CT are not as well-defined radiographically, only  subtly evident. No new lung abnormalities. 2. Mediastinal and hilar adenopathy as well as axillary adenopathy is also less well-defined than it was on the current CT. Electronically Signed   By: Lajean Manes M.D.   On: 09/22/2017 17:40   Ct Chest W Contrast  Result Date: 09/22/2017 CLINICAL DATA:  56 year old female with increasing shortness of breath  for the past 3 days. History of chronic cough. Evaluate for pneumonia. History of chronic lymphocytic leukemia (CLL) and atrial fibrillation. EXAM: CT CHEST WITH CONTRAST TECHNIQUE: Multidetector CT imaging of the chest was performed during intravenous contrast administration. CONTRAST:  29mL ISOVUE-300 IOPAMIDOL (ISOVUE-300) INJECTION 61% COMPARISON:  PET-CT 09/18/2017. FINDINGS: Cardiovascular: Heart size is borderline enlarged. There is no significant pericardial fluid, thickening or pericardial calcification. There is aortic atherosclerosis, as well as atherosclerosis of the great vessels of the mediastinum and the coronary arteries, including calcified atherosclerotic plaque in the left anterior descending, left circumflex and right coronary arteries. Dilatation of the pulmonic trunk (4.1 cm in diameter), concerning for pulmonary arterial hypertension. Mediastinum/Nodes: Extensive mediastinal and bilateral hilar lymphadenopathy, similar to recent PET-CT, with the largest lymph nodes measuring up to 18 mm in short axis in the right hilar region. Esophagus is unremarkable in appearance. Numerous enlarged axillary lymph nodes bilaterally measuring up to 17 mm in short axis on the left and 18 mm in short axis on the right. Lungs/Pleura: As with the recent PET-CT there is extensive patchy ground-glass attenuation scattered throughout the lungs bilaterally, the severity of which is very similar to the prior study. A few scattered areas of more dense peribronchovascular airspace consolidation are also noted, slightly increased compared to the recent examination. No pleural effusions. No definite discrete suspicious pulmonary nodules or masses. Upper Abdomen: Spleen is incompletely visualized but appears likely enlarged measuring up to of 4.0 x 12.0 cm Musculoskeletal: There are no aggressive appearing lytic or blastic lesions noted in the visualized portions of the skeleton. IMPRESSION: 1. Unusual appearance of the  lung parenchyma with progressive ground-glass attenuation and developing areas of patchy peribronchovascular consolidation throughout the lungs bilaterally. 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 Alternatively, findings may simply reflect evolving atypical infection in the setting of underlying CLL, or are less likely related to direct pulmonic leukemic infiltration. 2. Widespread mediastinal, bilateral hilar and bilateral axillary lymphadenopathy related to underlying CLL. Probable splenomegaly also noted. 3. Aortic atherosclerosis, in addition to three-vessel coronary artery disease. Please note that although the presence of coronary artery calcium documents the presence of coronary artery disease, the severity of this disease and any potential stenosis cannot be assessed on this non-gated CT examination. Assessment for potential risk factor modification, dietary therapy or pharmacologic therapy may be warranted, if clinically indicated. 4. Dilatation of the pulmonic trunk (4.1 cm in diameter), concerning for pulmonary arterial hypertension. Aortic Atherosclerosis (ICD10-I70.0). Electronically Signed   By: Vinnie Langton M.D.   On: 09/22/2017 12:02     Medical Consultants:    None.  Anti-Infectives:   IV vancomycin and cefepime.  Subjective:    Finis Bud she relates she is still mildly dyspneic  Objective:    Vitals:   09/23/17 2149 09/24/17 0230 09/24/17 0408 09/24/17 0618  BP: (!) 143/72  136/73   Pulse: 90  (!) 110   Resp: (!) 25  20   Temp: 98.5 F (36.9 C) 99.6 F (37.6 C) (!) 101.3 F (38.5 C) 99.4 F (37.4 C)  TempSrc: Oral Oral Oral Oral  SpO2: 94%     Weight:      Height:        Intake/Output Summary (Last 24 hours) at 09/24/2017 1002 Last data filed at 09/24/2017 0907 Gross per 24 hour  Intake 753 ml  Output -  Net 753 ml   Filed Weights   09/22/17 1617 09/22/17 2020  Weight: 83.9 kg  (185 lb) 83.9 kg (185 lb)    Exam: General exam: In no acute distress. Respiratory system: Good air movement and clear to auscultation. Cardiovascular system: S1 & S2 heard, RRR. +JVD Gastrointestinal system: Abdomen is nondistended, soft and nontender.  Central nervous system: Alert and oriented. No focal neurological deficits. Extremities: No lower extremity edema. Skin: No rashes, lesions or ulcers Psychiatry: Judgement and insight appear normal. Mood & affect appropriate.    Data Reviewed:    Labs: Basic Metabolic Panel: Recent Labs  Lab 09/21/17 0950  09/22/17 0945 09/22/17 1638 09/23/17 0333 09/24/17 0801  NA 140  --  142 137 139 139  K 3.2*   < > 3.6 4.9 4.9 3.2*  CL  --   --  107 107 111 111  CO2 16*  --  24 23 19* 20*  GLUCOSE 141*  --  102* 100* 118* 108*  BUN 21.1  --  19 15 14 17   CREATININE 1.1  --  1.05 1.07* 0.83 0.81  CALCIUM 10.1  --  9.5 9.7 8.6* 9.1  PHOS  --   --   --   --   --  3.8   < > = values in this interval not displayed.   GFR Estimated Creatinine Clearance: 86.3 mL/min (by C-G formula based on SCr of 0.81 mg/dL). Liver Function Tests: Recent Labs  Lab 09/21/17 0950 09/22/17 1638 09/24/17 0801  AST 19 37 29  ALT 11 13* 10*  ALKPHOS 65 57 49  BILITOT 0.92 0.8 1.2  PROT 6.5 5.8* 5.4*  ALBUMIN 3.5 3.3* 2.9*   No results for input(s): LIPASE, AMYLASE in the last 168 hours. No results for input(s): AMMONIA in the last 168 hours. Coagulation profile No results for input(s): INR, PROTIME in the last 168 hours.  CBC: Recent Labs  Lab 09/21/17 0950 09/22/17 1638 09/23/17 0333 09/24/17 0801  WBC 144.2* 140.6* 108.4* 111.2*  NEUTROABS  --  7.0 7.6 11.1*  HGB 12.8 11.8* 10.0* 9.9*  HCT 39.8 36.4 31.1* 30.2*  MCV 85.8 86.9 85.7 85.1  PLT 235 278 245 259   Cardiac Enzymes: No results for input(s): CKTOTAL, CKMB, CKMBINDEX, TROPONINI in the last 168 hours. BNP (last 3 results) No results for input(s): PROBNP in the last 8760  hours. CBG: Recent Labs  Lab 09/18/17 0707  GLUCAP 97   D-Dimer: No results for input(s): DDIMER in the last 72 hours. Hgb A1c: No results for input(s): HGBA1C in the last 72 hours. Lipid Profile: No results for input(s): CHOL, HDL, LDLCALC, TRIG, CHOLHDL, LDLDIRECT in the last 72 hours. Thyroid function studies: Recent Labs    09/22/17 0945  TSH 0.05*  T4TOTAL 11.4   Anemia work up: No results for input(s): VITAMINB12, FOLATE, FERRITIN, TIBC, IRON, RETICCTPCT in the last 72 hours. Sepsis Labs: Recent Labs  Lab 09/21/17 0950 09/22/17 1638 09/22/17 1940 09/22/17 2057 09/23/17 0333 09/24/17 0801  WBC 144.2* 140.6*  --   --  108.4* 111.2*  LATICACIDVEN  --   --  1.96* 1.57  --   --    Microbiology Recent Results (from the past 240 hour(s))  TECHNOLOGIST REVIEW     Status: None   Collection Time: 09/21/17  9:50 AM  Result Value Ref Range Status   Technologist Review Variant lymphs present, smudge cells  Final  Culture, blood (routine x 2)     Status: None (Preliminary result)   Collection Time: 09/22/17  6:38 PM  Result Value Ref Range Status   Specimen Description BLOOD LEFT ANTECUBITAL  Final   Special Requests   Final    Blood Culture adequate volume BOTTLES DRAWN AEROBIC AND ANAEROBIC   Culture NO GROWTH < 24 HOURS  Final   Report Status PENDING  Incomplete  Culture, blood (routine x 2)     Status: None (Preliminary result)   Collection Time: 09/22/17  6:43 PM  Result Value Ref Range Status   Specimen Description BLOOD RIGHT HAND  Final   Special Requests Blood Culture adequate volume IN PEDIATRIC BOTTLE  Final   Culture NO GROWTH < 24 HOURS  Final   Report Status PENDING  Incomplete  Respiratory Panel by PCR     Status: None   Collection Time: 09/22/17  8:54 PM  Result Value Ref Range Status   Adenovirus NOT DETECTED NOT DETECTED Final   Coronavirus 229E NOT DETECTED NOT DETECTED Final   Coronavirus HKU1 NOT DETECTED NOT DETECTED Final   Coronavirus NL63  NOT DETECTED NOT DETECTED Final   Coronavirus OC43 NOT DETECTED NOT DETECTED Final   Metapneumovirus NOT DETECTED NOT DETECTED Final   Rhinovirus / Enterovirus NOT DETECTED NOT DETECTED Final   Influenza A NOT DETECTED NOT DETECTED Final   Influenza B NOT DETECTED NOT DETECTED Final   Parainfluenza Virus 1 NOT DETECTED NOT DETECTED Final   Parainfluenza Virus 2 NOT DETECTED NOT DETECTED Final   Parainfluenza Virus 3 NOT DETECTED NOT DETECTED Final   Parainfluenza Virus 4 NOT DETECTED NOT DETECTED Final   Respiratory Syncytial Virus NOT DETECTED NOT DETECTED Final   Bordetella pertussis NOT DETECTED NOT DETECTED Final   Chlamydophila pneumoniae NOT DETECTED NOT DETECTED Final   Mycoplasma pneumoniae NOT DETECTED NOT DETECTED Final     Medications:   . acyclovir  400 mg Oral Daily  . allopurinol  300 mg Oral Daily  . apixaban  5 mg Oral BID  . cholecalciferol  1,000 Units Oral Q breakfast  . diltiazem  240 mg Oral Daily  . mouth rinse  15 mL Mouth Rinse BID  . metoprolol tartrate  37.5 mg Oral BID  . predniSONE  40 mg Oral Q breakfast  . sacubitril-valsartan  1 tablet Oral BID  . sodium chloride flush  3 mL Intravenous Q12H  . Venetoclax  10 mg Oral Q breakfast   Continuous Infusions: . sodium chloride    . azithromycin Stopped (09/23/17 2326)  . ceFEPime (MAXIPIME) IV Stopped (09/24/17 0645)  . vancomycin Stopped (09/24/17 0906)      LOS: 2 days   Charlynne Cousins  [facility] Pager [phone]  *Please refer to [email or web address], password [email or web address] to get updated schedule on who will round on this patient, as hospitalists switch teams weekly. If 7PM-7AM, please contact night-coverage at [email or web address], password [email or web address] for any overnight needs.  09/24/2017, 10:02 AM

## 2017-09-24 NOTE — Telephone Encounter (Signed)
Called [phone] but unable to leave a message.

## 2017-09-25 ENCOUNTER — Other Ambulatory Visit: Payer: Self-pay | Admitting: Pharmacist

## 2017-09-25 ENCOUNTER — Telehealth: Payer: Self-pay | Admitting: Hematology and Oncology

## 2017-09-25 DIAGNOSIS — C9110 Chronic lymphocytic leukemia of B-cell type not having achieved remission: Secondary | ICD-10-CM

## 2017-09-25 LAB — COMPREHENSIVE METABOLIC PANEL
ALBUMIN: 3 g/dL — AB (ref 3.5–5.0)
ALT: 14 U/L (ref 14–54)
ANION GAP: 6 (ref 5–15)
AST: 32 U/L (ref 15–41)
Alkaline Phosphatase: 50 U/L (ref 38–126)
BUN: 18 mg/dL (ref 6–20)
CO2: 23 mmol/L (ref 22–32)
Calcium: 9.4 mg/dL (ref 8.9–10.3)
Chloride: 110 mmol/L (ref 101–111)
Creatinine, Ser: 0.82 mg/dL (ref 0.44–1.00)
GFR calc Af Amer: >60 mL/min (ref >60)
GFR calc non Af Amer: >60 mL/min (ref >60)
GLUCOSE: 95 mg/dL (ref 65–99)
POTASSIUM: 4.6 mmol/L (ref 3.5–5.1)
SODIUM: 139 mmol/L (ref 135–145)
Total Bilirubin: 0.9 mg/dL (ref 0.3–1.2)
Total Protein: 5.8 g/dL — ABNORMAL LOW (ref 6.5–8.1)

## 2017-09-25 LAB — CBC WITH DIFFERENTIAL/PLATELET
Band Neutrophils: 0 %
Basophils Absolute: 0 10*3/uL (ref 0.0–0.1)
Basophils Relative: 0 %
Blasts: 0 %
EOS PCT: 0 %
Eosinophils Absolute: 0 10*3/uL (ref 0.0–0.7)
HCT: 31.8 % — ABNORMAL LOW (ref 36.0–46.0)
Hemoglobin: 10.5 g/dL — ABNORMAL LOW (ref 12.0–15.0)
LYMPHS ABS: 104.6 10*3/uL — AB (ref 0.7–4.0)
Lymphocytes Relative: 85 %
MCH: 27.9 pg (ref 26.0–34.0)
MCHC: 33 g/dL (ref 30.0–36.0)
MCV: 84.6 fL (ref 78.0–100.0)
MONOS PCT: 6 %
Metamyelocytes Relative: 0 %
Monocytes Absolute: 7.4 10*3/uL — ABNORMAL HIGH (ref 0.1–1.0)
Myelocytes: 0 %
NEUTROS ABS: 11.1 10*3/uL — AB (ref 1.7–7.7)
NEUTROS PCT: 9 %
NRBC: 0 /100{WBCs}
Other: 0 %
PLATELETS: 272 10*3/uL (ref 150–400)
Promyelocytes Absolute: 0 %
RBC: 3.76 MIL/uL — AB (ref 3.87–5.11)
RDW: 17.7 % — AB (ref 11.5–15.5)
WBC: 123.1 10*3/uL — AB (ref 4.0–10.5)

## 2017-09-25 LAB — URIC ACID: Uric Acid, Serum: 3.4 mg/dL (ref 2.3–6.6)

## 2017-09-25 LAB — PHOSPHORUS: Phosphorus: 3.1 mg/dL (ref 2.5–4.6)

## 2017-09-25 MED ORDER — VENETOCLAX 10 & 50 & 100 MG PO TBPK: 20.0000 mg | ORAL_TABLET | Freq: Every day | ORAL | 0 refills | Status: DC

## 2017-09-25 MED ORDER — VENETOCLAX 10 MG PO TABS
20.0000 mg | ORAL_TABLET | Freq: Every day | ORAL | Status: DC
  Administered 2017-09-26 – 2017-09-28 (×3): 20 mg via ORAL

## 2017-09-25 MED ORDER — ENSURE ENLIVE PO LIQD
237.0000 mL | Freq: Two times a day (BID) | ORAL | Status: DC
  Administered 2017-09-25 – 2017-09-28 (×5): 237 mL via ORAL

## 2017-09-25 MED FILL — VENCLEXTA STARTING PACK: 10 & 50 & 1 | 30 days supply | Qty: 42 | Fill #0

## 2017-09-25 NOTE — Progress Notes (Signed)
[facility] PROGRESS NOTE    Progress Note  Audrey Gross  HMC:[id number] DOB: 18-Jun-1961 DOA: 09/22/2017 PCP: Janith Lima, MD     Brief Narrative:   Audrey Gross is an 56 y.o. female [place] of paroxysmal atrial fibrillation on Eliquis, refractory CLL, chronic systolic heart failure and hypertension presents to the hospital with 3 days of worsening shortness of breath, oncology was consulted for suspected worsening CLL and tumor burden, will start empiric antibiotics for sepsis picture pneumonia.  Assessment/Plan:   Acute respiratory failure with hypoxia (HCC):/Pneumonia/CLL (chronic lymphocytic leukemia) ([place]) I personally discussed the case with her oncologist and she believes her shortness of breath is likely due to tumor burden from her CLL, her lactic acid is high probably due to her hypoxia. But will continue empiric antibiotics for now She has been started on her antineoplastic drugs, electrolytes are stable continue to monitor uric acid  Essential hypertension: Continue beta blockers and Entresto.  PAF (paroxysmal atrial fibrillation) (HCC) Rate controlled continue Eliquis.  Chronic systolic heart failure (HCC) Seems to have a little bit of JVD, will start her on her oral Lasix.  Last 2D echo showed an ejection fraction of 30-35%. Be euvolemic at this point, she has had minimal oral intake, the nurse relates that she has been nauseated and patient does not want to eat or drink. We will hold her Lasix, continue to check B-met daily.   DVT prophylaxis: lovenox Family Communication:none Disposition Plan/Barrier to D/C: unable to detemrine Code Status:     Code Status Orders  (From admission, onward)        Start     Ordered   09/22/17 2053  Full code  Continuous     09/22/17 2053    Code Status History    Date Active Date Inactive Code Status Order ID Comments User Context   07/15/2017 19:29 07/19/2017 18:11 Full Code [id number]  Eugenie Filler, MD  Inpatient   02/26/2017 16:44 02/28/2017 19:46 Full Code [id number]  Delos Haring, PA-C ED        IV Access:    Peripheral IV   Procedures and diagnostic studies:   No results found.   Medical Consultants:    None.  Anti-Infectives:   IV vancomycin and cefepime.  Subjective:    Audrey Gross she cont to be mildly dyspneic, she is concerned about me washing her hands in front of her there is a second time she has done this.  Objective:    Vitals:   09/24/17 0618 09/24/17 1400 09/24/17 2115 09/25/17 0501  BP:  116/69 (!) 144/75 127/72  Pulse:  96 99 (!) 108  Resp:  '20 18 20  ' Temp: 99.4 F (37.4 C) 99.4 F (37.4 C) 98.8 F (37.1 C) (!) 101.1 F (38.4 C)  TempSrc: Oral Oral Oral Oral  SpO2:  94% 92% 92%  Weight:      Height:        Intake/Output Summary (Last 24 hours) at 09/25/2017 1015 Last data filed at 09/24/2017 1705 Gross per 24 hour  Intake 50 ml  Output -  Net 50 ml   Filed Weights   09/22/17 1617 09/22/17 2020  Weight: 83.9 kg (185 lb) 83.9 kg (185 lb)    Exam: General exam: In no acute distress. Respiratory system: Good air movement and clear to auscultation. Cardiovascular system: S1 & S2 heard, RRR. - JVD Gastrointestinal system: Abdomen is nondistended, soft and nontender.  Central nervous system: Alert and oriented. No focal  neurological deficits. Extremities: No lower extremity edema. Skin: No rashes, lesions or ulcers    Data Reviewed:    Labs: Basic Metabolic Panel: Recent Labs  Lab 09/24/17 0801 09/24/17 1427 09/24/17 1915 09/24/17 2223 09/25/17 0451  NA 139 137 138 136 139  K 3.2* 4.2 4.4 4.4 4.6  CL 111 109 108 108 110  CO2 20* 19* 21* 21* 23  GLUCOSE 108* 175* 156* 192* 95  BUN 17 16 21* 20 18  CREATININE 0.81 0.87 0.97 0.85 0.82  CALCIUM 9.1 9.4 9.3 9.1 9.4  PHOS 3.8 3.1 3.3 2.8 3.1   GFR Estimated Creatinine Clearance: 85.3 mL/min (by C-G formula based on SCr of 0.82 mg/dL). Liver Function  Tests: Recent Labs  Lab 09/24/17 0801 09/24/17 1427 09/24/17 1915 09/24/17 2223 09/25/17 0451  AST 29 40 32 35 32  ALT 10* '14 14 14 14  ' ALKPHOS 49 57 54 52 50  BILITOT 1.2 0.9 0.6 1.1 0.9  PROT 5.4* 6.3* 6.1* 5.8* 5.8*  ALBUMIN 2.9* 3.2* 3.2* 3.0* 3.0*   No results for input(s): LIPASE, AMYLASE in the last 168 hours. No results for input(s): AMMONIA in the last 168 hours. Coagulation profile No results for input(s): INR, PROTIME in the last 168 hours.  CBC: Recent Labs  Lab 09/21/17 0950 09/22/17 1638 09/23/17 0333 09/24/17 0801 09/25/17 0451  WBC 144.2* 140.6* 108.4* 111.2* 123.1*  NEUTROABS  --  7.0 7.6 11.1* 11.1*  HGB 12.8 11.8* 10.0* 9.9* 10.5*  HCT 39.8 36.4 31.1* 30.2* 31.8*  MCV 85.8 86.9 85.7 85.1 84.6  PLT 235 278 245 259 272   Cardiac Enzymes: No results for input(s): CKTOTAL, CKMB, CKMBINDEX, TROPONINI in the last 168 hours. BNP (last 3 results) No results for input(s): PROBNP in the last 8760 hours. CBG: No results for input(s): GLUCAP in the last 168 hours. D-Dimer: No results for input(s): DDIMER in the last 72 hours. Hgb A1c: No results for input(s): HGBA1C in the last 72 hours. Lipid Profile: No results for input(s): CHOL, HDL, LDLCALC, TRIG, CHOLHDL, LDLDIRECT in the last 72 hours. Thyroid function studies: No results for input(s): TSH, T4TOTAL, T3FREE, THYROIDAB in the last 72 hours.  Invalid input(s): FREET3 Anemia work up: No results for input(s): VITAMINB12, FOLATE, FERRITIN, TIBC, IRON, RETICCTPCT in the last 72 hours. Sepsis Labs: Recent Labs  Lab 09/22/17 1638 09/22/17 1940 09/22/17 2057 09/23/17 0333 09/24/17 0801 09/25/17 0451  WBC 140.6*  --   --  108.4* 111.2* 123.1*  LATICACIDVEN  --  1.96* 1.57  --   --   --    Microbiology Recent Results (from the past 240 hour(s))  TECHNOLOGIST REVIEW     Status: None   Collection Time: 09/21/17  9:50 AM  Result Value Ref Range Status   Technologist Review Variant lymphs present,  smudge cells  Final  Culture, blood (routine x 2)     Status: None (Preliminary result)   Collection Time: 09/22/17  6:38 PM  Result Value Ref Range Status   Specimen Description BLOOD LEFT ANTECUBITAL  Final   Special Requests   Final    Blood Culture adequate volume BOTTLES DRAWN AEROBIC AND ANAEROBIC   Culture NO GROWTH 2 DAYS  Final   Report Status PENDING  Incomplete  Culture, blood (routine x 2)     Status: None (Preliminary result)   Collection Time: 09/22/17  6:43 PM  Result Value Ref Range Status   Specimen Description BLOOD RIGHT HAND  Final   Special Requests Blood  Culture adequate volume IN PEDIATRIC BOTTLE  Final   Culture NO GROWTH 2 DAYS  Final   Report Status PENDING  Incomplete  Respiratory Panel by PCR     Status: None   Collection Time: 09/22/17  8:54 PM  Result Value Ref Range Status   Adenovirus NOT DETECTED NOT DETECTED Final   Coronavirus 229E NOT DETECTED NOT DETECTED Final   Coronavirus HKU1 NOT DETECTED NOT DETECTED Final   Coronavirus NL63 NOT DETECTED NOT DETECTED Final   Coronavirus OC43 NOT DETECTED NOT DETECTED Final   Metapneumovirus NOT DETECTED NOT DETECTED Final   Rhinovirus / Enterovirus NOT DETECTED NOT DETECTED Final   Influenza A NOT DETECTED NOT DETECTED Final   Influenza B NOT DETECTED NOT DETECTED Final   Parainfluenza Virus 1 NOT DETECTED NOT DETECTED Final   Parainfluenza Virus 2 NOT DETECTED NOT DETECTED Final   Parainfluenza Virus 3 NOT DETECTED NOT DETECTED Final   Parainfluenza Virus 4 NOT DETECTED NOT DETECTED Final   Respiratory Syncytial Virus NOT DETECTED NOT DETECTED Final   Bordetella pertussis NOT DETECTED NOT DETECTED Final   Chlamydophila pneumoniae NOT DETECTED NOT DETECTED Final   Mycoplasma pneumoniae NOT DETECTED NOT DETECTED Final     Medications:   . acyclovir  400 mg Oral Daily  . allopurinol  300 mg Oral Daily  . apixaban  5 mg Oral BID  . cholecalciferol  1,000 Units Oral Q breakfast  . diltiazem  240 mg  Oral Daily  . furosemide  40 mg Oral Daily  . mouth rinse  15 mL Mouth Rinse BID  . metoprolol tartrate  37.5 mg Oral BID  . predniSONE  40 mg Oral Q breakfast  . sacubitril-valsartan  1 tablet Oral BID  . sodium chloride flush  3 mL Intravenous Q12H  . Venetoclax  10 mg Oral Q breakfast   Continuous Infusions: . sodium chloride    . azithromycin Stopped (09/24/17 2217)  . ceFEPime (MAXIPIME) IV Stopped (09/25/17 0533)  . vancomycin 1,000 mg (09/25/17 0809)      LOS: 3 days   [facility] Pager [phone]  *Please refer to [email or web address], password [email or web address] to get updated schedule on who will round on this patient, as hospitalists switch teams weekly. If 7PM-7AM, please contact night-coverage at [email or web address], password [email or web address] for any overnight needs.  09/25/2017, 10:15 AM

## 2017-09-25 NOTE — Progress Notes (Signed)
Pharmacy Antibiotic Note Audrey Gross is a 56 y.o. 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 Pharmacy has been consulted for cefepime and vancomycin dosing.  09/25/2017 WBC 123 Scr 08.2, stable Febrile  101.1  Plan: 1. Continue vancomycin 1gm IV q12h 2. Continue Cefepime 2 grams IV every 12 hours. 3.   F/u renal function, cultures and clinical course  Height: 5\' 7"  (170.2 cm) Weight: 185 lb (83.9 kg) IBW/kg (Calculated) : 61.6  Temp (24hrs), Avg:99.8 F (37.7 C), Min:98.8 F (37.1 C), Max:101.1 F (38.4 C)  Recent Labs  Lab 09/21/17 0950  09/22/17 1638 09/22/17 1940 09/22/17 2057 09/23/17 0333 09/24/17 0801 09/24/17 1427 09/24/17 1915 09/24/17 2223 09/25/17 0451  WBC 144.2*  --  140.6*  --   --  108.4* 111.2*  --   --   --  123.1*  CREATININE 1.1   < > 1.07*  --   --  0.83 0.81 0.87 0.97 0.85 0.82  LATICACIDVEN  --   --   --  1.96* 1.57  --   --   --   --   --   --    < > = values in this interval not displayed.    Estimated Creatinine Clearance: 85.3 mL/min (by C-G formula based on SCr of 0.82 mg/dL).    No Known Allergies  Antimicrobials this admission: 12/18 cefepime >>  12/18 vancomycin >>   Microbiology results: px  Thank you for allowing pharmacy to be a part of this patient's care.  Dolly Rias RPh 09/25/2017, 11:05 AM Pager [phone]

## 2017-09-25 NOTE — Telephone Encounter (Signed)
Informed patient of sleep study results and patient understanding was verbalized. Patient understands her sleep study showed no sleep apnea. Patient understands she has no further appointments scheduled. Patient was grateful the call and thanked me.

## 2017-09-25 NOTE — Progress Notes (Signed)
Initial Nutrition Assessment  DOCUMENTATION CODES:   Not applicable  INTERVENTION:   Ensure Enlive po BID, each supplement provides 350 kcal and 20 grams of protein  NUTRITION DIAGNOSIS:   Increased nutrient needs related to cancer and cancer related treatments as evidenced by estimated needs.  GOAL:   Patient will meet greater than or equal to 90% of their needs  MONITOR:   PO intake, Supplement acceptance, Weight trends, Labs  REASON FOR ASSESSMENT:   Malnutrition Screening Tool    ASSESSMENT:   56 year old female with PMH significant for refractory CLL, sCHF, and HTN. Pt is under the care of oncology and had recent PET scan showing continued adenopathy in the neck, chest, abdomen, pelvis, and opacities in bilateral lungs. Presents this admission after outpatient CT with findings suggestive of pulmonary leukocytosis for further evaluation.    Pt in noticeable pain during RD visit. Answering questions with one word. Reports having a loss in appetite for unknown amount of time related to ongoing nausea. States she has not ate much this admission for the same reason. RD observed breakfast tray at bedside with bites taken from it. No meal completions are recorded. Denies taste changes. Pt admits to losing 10 lb within a months time frame. Records indicate pt's weight fluctuates from 185-195 lb over the last year. Hard to determine dry weight loss given pt's history with CHF. Nutrition-Focused physical exam completed. Will provide pt with supplementation and continue to encourage PO intake.   Medications reviewed and include: Vit D, prednisone, Iv abx Labs reviewed.   NUTRITION - FOCUSED PHYSICAL EXAM:    Most Recent Value  Orbital Region  No depletion  Upper Arm Region  No depletion  Thoracic and Lumbar Region  Unable to assess  Buccal Region  No depletion  Temple Region  No depletion  Clavicle Bone Region  No depletion  Clavicle and Acromion Bone Region  No depletion   Scapular Bone Region  Unable to assess  Dorsal Hand  No depletion  Patellar Region  No depletion  Anterior Thigh Region  No depletion  Posterior Calf Region  No depletion  Edema (RD Assessment)  Mild  Hair  Reviewed  Eyes  Reviewed  Mouth  Reviewed  Skin  Reviewed  Nails  Reviewed       Diet Order:  Diet regular Room service appropriate? Yes; Fluid consistency: Thin1600  EDUCATION NEEDS:   Not appropriate for education at this time  Skin:  Skin Assessment: Reviewed RN Assessment  Last BM:  09/24/17  Height:   Ht Readings from Last 1 Encounters:  09/22/17 5\' 7"  (1.702 m)    Weight:   Wt Readings from Last 1 Encounters:  09/22/17 185 lb (83.9 kg)    Ideal Body Weight:  61.4 kg  BMI:  Body mass index is 28.98 kg/m.  Estimated Nutritional Needs:   Kcal:  1600-1800 kcal/day  Protein:  75-85 g/day  Fluid:  >1.6 L/day    Mariana Single RD, LDN Clinical Nutrition Pager # - [phone]

## 2017-09-25 NOTE — Telephone Encounter (Signed)
Faxed office note to [place] [phone]

## 2017-09-25 NOTE — Progress Notes (Signed)
Audrey Gross   DOB:Dec 20, 1960   PO#:[id number]    Subjective: She continues to have significant shortness of breath.  She had mild fever this morning.  Denies cough.  Objective:  Vitals:   09/24/17 2115 09/25/17 0501  BP: (!) 144/75 127/72  Pulse: 99 (!) 108  Resp: 18 20  Temp: 98.8 F (37.1 C) (!) 101.1 F (38.4 C)  SpO2: 92% 92%     Intake/Output Summary (Last 24 hours) at 09/25/2017 0829 Last data filed at 09/24/2017 1705 Gross per 24 hour  Intake 53 ml  Output -  Net 53 ml    GENERAL:alert, no distress and comfortable.  She have oxygen delivered via nasal cannula SKIN: skin color, texture, turgor are normal, no rashes or significant lesions EYES: normal, Conjunctiva are pink and non-injected, sclera clear OROPHARYNX:no exudate, no erythema and lips, buccal mucosa, and tongue normal  NECK: Persistent palpable lymphadenopathy LUNGS: clear to auscultation and percussion with normal breathing effort HEART: Tachycardia, no murmurs and no lower extremity edema ABDOMEN:abdomen soft, non-tender and normal bowel sounds Musculoskeletal:no cyanosis of digits and no clubbing  NEURO: alert & oriented x 3 with fluent speech, no focal motor/sensory deficits   Labs:  Lab Results  Component Value Date   WBC 123.1 (HH) 09/25/2017   HGB 10.5 (L) 09/25/2017   HCT 31.8 (L) 09/25/2017   MCV 84.6 09/25/2017   PLT 272 09/25/2017   NEUTROABS 11.1 (H) 09/25/2017    Lab Results  Component Value Date   NA 139 09/25/2017   K 4.6 09/25/2017   CL 110 09/25/2017   CO2 23 09/25/2017    Assessment & Plan:   Locally advanced CLL/small lymphocytic lymphoma We discussed the importance of proceeding with treatment because I believe her symptoms are related to leukemic infiltration from CLL.  We discussed the risks, benefits, side effects of venetoclax including high risk of tumor lysis syndrome, renal failure, allergic reaction and she agreed to proceed She has received first dose of  Venetoclax on September 24, 2017, to continue at 10 mg daily  High risk tumor lysis syndrome She will continue allopurinol So far, no signs of tumor lysis syndrome  Fever, abnormal imaging study, shortness of breath So far, cultures are negative I recommend continue antibiotic treatment for now.  History of chronic atrial fibrillation and cardiomyopathy She has no signs of congestive heart failure now She will continue telemetry monitoring  Poor venous access She has PICC line placement 09/24/17  CODE STATUS Full code  Discharge planning Not ready to go home today.  I will reassess tomorrow  Heath Lark, MD 09/25/2017  8:29 AM

## 2017-09-26 LAB — CBC WITH DIFFERENTIAL/PLATELET
BAND NEUTROPHILS: 0 %
BASOS ABS: 0 10*3/uL (ref 0.0–0.1)
Basophils Relative: 0 %
Blasts: 0 %
EOS PCT: 0 %
Eosinophils Absolute: 0 10*3/uL (ref 0.0–0.7)
HCT: 30.9 % — ABNORMAL LOW (ref 36.0–46.0)
Hemoglobin: 10 g/dL — ABNORMAL LOW (ref 12.0–15.0)
LYMPHS ABS: 82.6 10*3/uL — AB (ref 0.7–4.0)
Lymphocytes Relative: 83 %
MCH: 27.7 pg (ref 26.0–34.0)
MCHC: 32.4 g/dL (ref 30.0–36.0)
MCV: 85.6 fL (ref 78.0–100.0)
METAMYELOCYTES PCT: 0 %
MONO ABS: 0 10*3/uL — AB (ref 0.1–1.0)
MONOS PCT: 0 %
Myelocytes: 0 %
NEUTROS ABS: 16.9 10*3/uL — AB (ref 1.7–7.7)
Neutrophils Relative %: 17 %
PLATELETS: 278 10*3/uL (ref 150–400)
Promyelocytes Absolute: 0 %
RBC: 3.61 MIL/uL — AB (ref 3.87–5.11)
RDW: 17.5 % — AB (ref 11.5–15.5)
WBC: 99.5 10*3/uL (ref 4.0–10.5)
nRBC: 0 /100 WBC

## 2017-09-26 LAB — COMPREHENSIVE METABOLIC PANEL
ALBUMIN: 2.9 g/dL — AB (ref 3.5–5.0)
ALBUMIN: 3.1 g/dL — AB (ref 3.5–5.0)
ALBUMIN: 2.9 g/dL — AB (ref 3.5–5.0)
ALK PHOS: 44 U/L (ref 38–126)
ALK PHOS: 54 U/L (ref 38–126)
ALK PHOS: 47 U/L (ref 38–126)
ALT: 14 U/L (ref 14–54)
ALT: 15 U/L (ref 14–54)
ALT: 16 U/L (ref 14–54)
ALT: 15 U/L (ref 14–54)
ANION GAP: 9 (ref 5–15)
ANION GAP: 7 (ref 5–15)
AST: 34 U/L (ref 15–41)
AST: 38 U/L (ref 15–41)
AST: 34 U/L (ref 15–41)
AST: 30 U/L (ref 15–41)
Albumin: 3 g/dL — ABNORMAL LOW (ref 3.5–5.0)
Alkaline Phosphatase: 49 U/L (ref 38–126)
Anion gap: 7 (ref 5–15)
Anion gap: 11 (ref 5–15)
BILIRUBIN TOTAL: 0.9 mg/dL (ref 0.3–1.2)
BUN: 20 mg/dL (ref 6–20)
BUN: 17 mg/dL (ref 6–20)
BUN: 20 mg/dL (ref 6–20)
BUN: 24 mg/dL — AB (ref 6–20)
CALCIUM: 9.3 mg/dL (ref 8.9–10.3)
CALCIUM: 9.3 mg/dL (ref 8.9–10.3)
CHLORIDE: 104 mmol/L (ref 101–111)
CHLORIDE: 107 mmol/L (ref 101–111)
CO2: 24 mmol/L (ref 22–32)
CO2: 20 mmol/L — AB (ref 22–32)
CO2: 22 mmol/L (ref 22–32)
CO2: 25 mmol/L (ref 22–32)
CREATININE: 0.79 mg/dL (ref 0.44–1.00)
CREATININE: 0.97 mg/dL (ref 0.44–1.00)
Calcium: 9.3 mg/dL (ref 8.9–10.3)
Calcium: 9.1 mg/dL (ref 8.9–10.3)
Chloride: 109 mmol/L (ref 101–111)
Chloride: 107 mmol/L (ref 101–111)
Creatinine, Ser: 1.19 mg/dL — ABNORMAL HIGH (ref 0.44–1.00)
Creatinine, Ser: 0.83 mg/dL (ref 0.44–1.00)
GFR calc Af Amer: >60 mL/min (ref >60)
GFR calc Af Amer: >60 mL/min (ref >60)
GFR calc non Af Amer: >60 mL/min (ref >60)
GFR calc non Af Amer: >60 mL/min (ref >60)
GFR calc non Af Amer: 50 mL/min — ABNORMAL LOW (ref >60)
GFR calc non Af Amer: >60 mL/min (ref >60)
GFR, EST AFRICAN AMERICAN: 58 mL/min — AB (ref >60)
GLUCOSE: 107 mg/dL — AB (ref 65–99)
GLUCOSE: 201 mg/dL — AB (ref 65–99)
GLUCOSE: 148 mg/dL — AB (ref 65–99)
Glucose, Bld: 199 mg/dL — ABNORMAL HIGH (ref 65–99)
POTASSIUM: 4.1 mmol/L (ref 3.5–5.1)
POTASSIUM: 4.3 mmol/L (ref 3.5–5.1)
Potassium: 3.8 mmol/L (ref 3.5–5.1)
Potassium: 4.6 mmol/L (ref 3.5–5.1)
SODIUM: 140 mmol/L (ref 135–145)
SODIUM: 135 mmol/L (ref 135–145)
SODIUM: 138 mmol/L (ref 135–145)
SODIUM: 139 mmol/L (ref 135–145)
TOTAL PROTEIN: 5.7 g/dL — AB (ref 6.5–8.1)
Total Bilirubin: 0.8 mg/dL (ref 0.3–1.2)
Total Bilirubin: 0.9 mg/dL (ref 0.3–1.2)
Total Bilirubin: 0.9 mg/dL (ref 0.3–1.2)
Total Protein: 5.7 g/dL — ABNORMAL LOW (ref 6.5–8.1)
Total Protein: 6 g/dL — ABNORMAL LOW (ref 6.5–8.1)
Total Protein: 6.4 g/dL — ABNORMAL LOW (ref 6.5–8.1)

## 2017-09-26 LAB — URIC ACID
URIC ACID, SERUM: 3.6 mg/dL (ref 2.3–6.6)
URIC ACID, SERUM: 3.2 mg/dL (ref 2.3–6.6)
Uric Acid, Serum: 3.4 mg/dL (ref 2.3–6.6)
Uric Acid, Serum: 3.1 mg/dL (ref 2.3–6.6)

## 2017-09-26 LAB — BRAIN NATRIURETIC PEPTIDE: B Natriuretic Peptide: 72.1 pg/mL (ref 0.0–100.0)

## 2017-09-26 LAB — PHOSPHORUS
PHOSPHORUS: 2.2 mg/dL — AB (ref 2.5–4.6)
PHOSPHORUS: 3.4 mg/dL (ref 2.5–4.6)
Phosphorus: 3.4 mg/dL (ref 2.5–4.6)
Phosphorus: 3.1 mg/dL (ref 2.5–4.6)

## 2017-09-26 MED ORDER — FUROSEMIDE 40 MG PO TABS
40.0000 mg | ORAL_TABLET | Freq: Every day | ORAL | Status: DC
  Administered 2017-09-26 – 2017-09-28 (×3): 40 mg via ORAL
  Filled 2017-09-26 (×3): qty 1

## 2017-09-26 MED ORDER — PREDNISONE 20 MG PO TABS
20.0000 mg | ORAL_TABLET | Freq: Every day | ORAL | Status: DC
  Administered 2017-09-27: 20 mg via ORAL
  Filled 2017-09-26: qty 1

## 2017-09-26 NOTE — Progress Notes (Signed)
Pt's O2 on room air sitting 88% Pt's O2 on room air ambulating hallway 82% Applied O2 via  at 2L/min

## 2017-09-26 NOTE — Progress Notes (Signed)
Audrey Gross   DOB:November 10, 1960   MA#:[id number]    Subjective: She continues to experience shortness of breath.  She denies chills.  Denies cough.  She has no further fever since yesterday.  Objective:  Vitals:   09/25/17 2032 09/26/17 0556  BP: 133/73 128/69  Pulse: 78 96  Resp: 18 17  Temp: 98.3 F (36.8 C) 98.6 F (37 C)  SpO2: 93% 93%     Intake/Output Summary (Last 24 hours) at 09/26/2017 1019 Last data filed at 09/25/2017 2006 Gross per 24 hour  Intake 730 ml  Output 1950 ml  Net -1220 ml    GENERAL:alert, no distress and comfortable SKIN: skin color, texture, turgor are normal, no rashes or significant lesions EYES: normal, Conjunctiva are pink and non-injected, sclera clear OROPHARYNX:no exudate, no erythema and lips, buccal mucosa, and tongue normal  NECK: supple, thyroid normal size, non-tender, without nodularity LYMPH: Palpable lymphadenopathy in the neck and axilla  lUNGS: clear to auscultation and percussion with normal breathing effort HEART: Tachycardia, no murmurs and no lower extremity edema ABDOMEN:abdomen soft, non-tender and normal bowel sounds Musculoskeletal:no cyanosis of digits and no clubbing  NEURO: alert & oriented x 3 with fluent speech, no focal motor/sensory deficits   Labs:  Lab Results  Component Value Date   WBC 99.5 (HH) 09/26/2017   HGB 10.0 (L) 09/26/2017   HCT 30.9 (L) 09/26/2017   MCV 85.6 09/26/2017   PLT 278 09/26/2017   NEUTROABS 16.9 (H) 09/26/2017    Lab Results  Component Value Date   NA 140 09/26/2017   K 3.8 09/26/2017   CL 109 09/26/2017   CO2 24 09/26/2017    Assessment & Plan:  Locally advanced CLL/small lymphocytic lymphoma We discussed the importance of proceeding with treatment because I believe her symptoms are related to leukemic infiltration from CLL. We discussed the risks, benefits, side effects of venetoclax including high risk of tumor lysis syndrome, renal failure, allergic reaction and she agreed to  proceed She has received first dose of Venetoclax on September 24, 2017 Starting today, we are escalating the dose to 20 mg daily.  Continue close monitoring for tumor lysis syndrome per protocol.  I plan to reduce the dose of prednisone to 20 mg daily  High risk tumor lysis syndrome She will continue allopurinol So far, no signs of tumor lysis syndrome  Fever, abnormal imaging study, shortness of breath So far, cultures are negative I recommend de-escalating antibiotics treatment by stopping vancomycin today.  Will discuss with primary service  History of chronic atrial fibrillation and cardiomyopathy She has no signs of congestive heart failure now She will continue telemetry monitoring I will check BNP level today Plan to slowly wean her off oxygen the next few days  Poor venous access She has PICC line placement 09/24/17  CODE STATUS Full code  Discharge planning Not ready to go home today.  I will reassess tomorrow  Audrey Lark, MD 09/26/2017  10:19 AM

## 2017-09-26 NOTE — Progress Notes (Addendum)
[facility] PROGRESS NOTE    Progress Note  RUSSELL ENGELSTAD  OMB:[id number] DOB: [birth year] DOA: 09/22/2017 PCP: Janith Lima, MD     Brief Narrative:   Audrey Gross is an 56 y.o. female [place] of paroxysmal atrial fibrillation on Eliquis, refractory CLL, chronic systolic heart failure and hypertension presents to the hospital with 3 days of worsening shortness of breath, oncology was consulted for suspected worsening CLL and tumor burden, will start empiric antibiotics for sepsis picture pneumonia.  Assessment/Plan:   Acute respiratory failure with hypoxia (HCC):/Pneumonia/CLL (chronic lymphocytic leukemia) ([place]) She relates her breathing is somewhat improved but not to baseline. She was started on chemotherapy by oncologist, they recommended to monitor for  tumor lysis syndrome, as this medication high incidence tumor lysis syndrome. Continue 7-day course of treatment of antibiotic.d/c vanc as cultures have been negative. She has been started on her antineoplastic drugs, electrolytes are stable continue to monitor uric acid  Essential hypertension: Continue beta blockers and Entresto.  PAF (paroxysmal atrial fibrillation) (HCC) Rate controlled continue Eliquis.  Chronic systolic heart failure (HCC) 2D echo showed an ejection fraction of 30-35%. Seems euvolemic at this point, she has had minimal oral intake, the nurse relates that she has been nauseated and patient does not want to eat or drink. Will resume oral Lasix, continue to check B-met daily.   DVT prophylaxis: lovenox Family Communication:none Disposition Plan/Barrier to D/C: unable to detemrine Code Status:     Code Status Orders  (From admission, onward)        Start     Ordered   09/22/17 2053  Full code  Continuous     09/22/17 2053    Code Status History    Date Active Date Inactive Code Status Order ID Comments User Context   07/15/2017 19:29 07/19/2017 18:11 Full Code [id number]  Eugenie Filler, MD Inpatient   02/26/2017 16:44 02/28/2017 19:46 Full Code [id number]  Delos Haring, PA-C ED        IV Access:    Peripheral IV   Procedures and diagnostic studies:   No results found.   Medical Consultants:    None.  Anti-Infectives:   IV vancomycin and cefepime.  Subjective:    Finis Bud but she relates she SOB, but improved compare to yesterday  Objective:    Vitals:   09/25/17 0501 09/25/17 1301 09/25/17 2032 09/26/17 0556  BP: 127/72 125/85 133/73 128/69  Pulse: (!) 108 (!) 102 78 96  Resp: '20 19 18 17  ' Temp: (!) 101.1 F (38.4 C) (!) 100.6 F (38.1 C) 98.3 F (36.8 C) 98.6 F (37 C)  TempSrc: Oral Oral Oral Oral  SpO2: 92% (!) 89% 93% 93%  Weight:      Height:        Intake/Output Summary (Last 24 hours) at 09/26/2017 1026 Last data filed at 09/25/2017 2006 Gross per 24 hour  Intake 730 ml  Output 1950 ml  Net -1220 ml   Filed Weights   09/22/17 1617 09/22/17 2020  Weight: 83.9 kg (185 lb) 83.9 kg (185 lb)    Exam: General exam: In no acute distress. Respiratory system: Good air movement and clear to auscultation. Cardiovascular system: S1 & S2 heard, RRR. - JVD Gastrointestinal system: Abdomen is nondistended, soft and nontender.  Central nervous system: Alert and oriented. No focal neurological deficits. Extremities: No lower extremity edema. Skin: No rashes, lesions or ulcers    Data Reviewed:    Labs: Basic Metabolic Panel:  Recent Labs  Lab 09/24/17 1427 09/24/17 1915 09/24/17 2223 09/25/17 0451 09/26/17 0401  NA 137 138 136 139 140  K 4.2 4.4 4.4 4.6 3.8  CL 109 108 108 110 109  CO2 19* 21* 21* 23 24  GLUCOSE 175* 156* 192* 95 107*  BUN 16 21* '20 18 20  ' CREATININE 0.87 0.97 0.85 0.82 0.79  CALCIUM 9.4 9.3 9.1 9.4 9.3  PHOS 3.1 3.3 2.8 3.1 3.4   GFR Estimated Creatinine Clearance: 87.4 mL/min (by C-G formula based on SCr of 0.79 mg/dL). Liver Function Tests: Recent Labs  Lab 09/24/17 1427  09/24/17 1915 09/24/17 2223 09/25/17 0451 09/26/17 0401  AST 40 32 35 32 34  ALT '14 14 14 14 14  ' ALKPHOS 57 54 52 50 44  BILITOT 0.9 0.6 1.1 0.9 0.8  PROT 6.3* 6.1* 5.8* 5.8* 5.7*  ALBUMIN 3.2* 3.2* 3.0* 3.0* 2.9*   No results for input(s): LIPASE, AMYLASE in the last 168 hours. No results for input(s): AMMONIA in the last 168 hours. Coagulation profile No results for input(s): INR, PROTIME in the last 168 hours.  CBC: Recent Labs  Lab 09/22/17 1638 09/23/17 0333 09/24/17 0801 09/25/17 0451 09/26/17 0401  WBC 140.6* 108.4* 111.2* 123.1* 99.5*  NEUTROABS 7.0 7.6 11.1* 11.1* 16.9*  HGB 11.8* 10.0* 9.9* 10.5* 10.0*  HCT 36.4 31.1* 30.2* 31.8* 30.9*  MCV 86.9 85.7 85.1 84.6 85.6  PLT 278 245 259 272 278   Cardiac Enzymes: No results for input(s): CKTOTAL, CKMB, CKMBINDEX, TROPONINI in the last 168 hours. BNP (last 3 results) No results for input(s): PROBNP in the last 8760 hours. CBG: No results for input(s): GLUCAP in the last 168 hours. D-Dimer: No results for input(s): DDIMER in the last 72 hours. Hgb A1c: No results for input(s): HGBA1C in the last 72 hours. Lipid Profile: No results for input(s): CHOL, HDL, LDLCALC, TRIG, CHOLHDL, LDLDIRECT in the last 72 hours. Thyroid function studies: No results for input(s): TSH, T4TOTAL, T3FREE, THYROIDAB in the last 72 hours.  Invalid input(s): FREET3 Anemia work up: No results for input(s): VITAMINB12, FOLATE, FERRITIN, TIBC, IRON, RETICCTPCT in the last 72 hours. Sepsis Labs: Recent Labs  Lab 09/22/17 1940 09/22/17 2057 09/23/17 0333 09/24/17 0801 09/25/17 0451 09/26/17 0401  WBC  --   --  108.4* 111.2* 123.1* 99.5*  LATICACIDVEN 1.96* 1.57  --   --   --   --    Microbiology Recent Results (from the past 240 hour(s))  TECHNOLOGIST REVIEW     Status: None   Collection Time: 09/21/17  9:50 AM  Result Value Ref Range Status   Technologist Review Variant lymphs present, smudge cells  Final  Culture, blood  (routine x 2)     Status: None (Preliminary result)   Collection Time: 09/22/17  6:38 PM  Result Value Ref Range Status   Specimen Description BLOOD LEFT ANTECUBITAL  Final   Special Requests   Final    Blood Culture adequate volume BOTTLES DRAWN AEROBIC AND ANAEROBIC   Culture NO GROWTH 3 DAYS  Final   Report Status PENDING  Incomplete  Culture, blood (routine x 2)     Status: None (Preliminary result)   Collection Time: 09/22/17  6:43 PM  Result Value Ref Range Status   Specimen Description BLOOD RIGHT HAND  Final   Special Requests Blood Culture adequate volume IN PEDIATRIC BOTTLE  Final   Culture NO GROWTH 3 DAYS  Final   Report Status PENDING  Incomplete  Respiratory Panel  by PCR     Status: None   Collection Time: 09/22/17  8:54 PM  Result Value Ref Range Status   Adenovirus NOT DETECTED NOT DETECTED Final   Coronavirus 229E NOT DETECTED NOT DETECTED Final   Coronavirus HKU1 NOT DETECTED NOT DETECTED Final   Coronavirus NL63 NOT DETECTED NOT DETECTED Final   Coronavirus OC43 NOT DETECTED NOT DETECTED Final   Metapneumovirus NOT DETECTED NOT DETECTED Final   Rhinovirus / Enterovirus NOT DETECTED NOT DETECTED Final   Influenza A NOT DETECTED NOT DETECTED Final   Influenza B NOT DETECTED NOT DETECTED Final   Parainfluenza Virus 1 NOT DETECTED NOT DETECTED Final   Parainfluenza Virus 2 NOT DETECTED NOT DETECTED Final   Parainfluenza Virus 3 NOT DETECTED NOT DETECTED Final   Parainfluenza Virus 4 NOT DETECTED NOT DETECTED Final   Respiratory Syncytial Virus NOT DETECTED NOT DETECTED Final   Bordetella pertussis NOT DETECTED NOT DETECTED Final   Chlamydophila pneumoniae NOT DETECTED NOT DETECTED Final   Mycoplasma pneumoniae NOT DETECTED NOT DETECTED Final     Medications:   . acyclovir  400 mg Oral Daily  . allopurinol  300 mg Oral Daily  . apixaban  5 mg Oral BID  . cholecalciferol  1,000 Units Oral Q breakfast  . diltiazem  240 mg Oral Daily  . feeding supplement  (ENSURE ENLIVE)  237 mL Oral BID BM  . mouth rinse  15 mL Mouth Rinse BID  . metoprolol tartrate  37.5 mg Oral BID  . [START ON 09/27/2017] predniSONE  20 mg Oral Q breakfast  . sacubitril-valsartan  1 tablet Oral BID  . sodium chloride flush  3 mL Intravenous Q12H  . Venetoclax  20 mg Oral Q breakfast   Continuous Infusions: . sodium chloride    . azithromycin Stopped (09/25/17 2311)  . ceFEPime (MAXIPIME) IV 2 g (09/26/17 0602)  . vancomycin 1,000 mg (09/26/17 0811)      LOS: 4 days   [facility] Pager [phone]  *Please refer to [email or web address], password [email or web address] to get updated schedule on who will round on this patient, as hospitalists switch teams weekly. If 7PM-7AM, please contact night-coverage at [email or web address], password [email or web address] for any overnight needs.  09/26/2017, 10:26 AM

## 2017-09-27 LAB — COMPREHENSIVE METABOLIC PANEL
ALK PHOS: 44 U/L (ref 38–126)
ALT: 18 U/L (ref 14–54)
AST: 33 U/L (ref 15–41)
Albumin: 2.7 g/dL — ABNORMAL LOW (ref 3.5–5.0)
Anion gap: 7 (ref 5–15)
BILIRUBIN TOTAL: 0.6 mg/dL (ref 0.3–1.2)
BUN: 23 mg/dL — AB (ref 6–20)
CALCIUM: 8.9 mg/dL (ref 8.9–10.3)
CO2: 25 mmol/L (ref 22–32)
CREATININE: 0.77 mg/dL (ref 0.44–1.00)
Chloride: 106 mmol/L (ref 101–111)
GFR calc Af Amer: >60 mL/min (ref >60)
Glucose, Bld: 89 mg/dL (ref 65–99)
POTASSIUM: 3.8 mmol/L (ref 3.5–5.1)
Sodium: 138 mmol/L (ref 135–145)
TOTAL PROTEIN: 5.7 g/dL — AB (ref 6.5–8.1)

## 2017-09-27 LAB — CULTURE, BLOOD (ROUTINE X 2)
CULTURE: NO GROWTH
CULTURE: NO GROWTH
SPECIAL REQUESTS: ADEQUATE
Special Requests: ADEQUATE

## 2017-09-27 LAB — CBC WITH DIFFERENTIAL/PLATELET
BASOS PCT: 0 %
Basophils Absolute: 0 10*3/uL (ref 0.0–0.1)
EOS ABS: 0 10*3/uL (ref 0.0–0.7)
Eosinophils Relative: 0 %
HCT: 30.5 % — ABNORMAL LOW (ref 36.0–46.0)
Hemoglobin: 9.7 g/dL — ABNORMAL LOW (ref 12.0–15.0)
LYMPHS ABS: 108.8 10*3/uL — AB (ref 0.7–4.0)
Lymphocytes Relative: 94 %
MCH: 27.3 pg (ref 26.0–34.0)
MCHC: 31.8 g/dL (ref 30.0–36.0)
MCV: 85.9 fL (ref 78.0–100.0)
MONO ABS: 0 10*3/uL — AB (ref 0.1–1.0)
Monocytes Relative: 0 %
NEUTROS ABS: 6.9 10*3/uL (ref 1.7–7.7)
NEUTROS PCT: 6 %
PLATELETS: 283 10*3/uL (ref 150–400)
RBC: 3.55 MIL/uL — ABNORMAL LOW (ref 3.87–5.11)
RDW: 17.5 % — AB (ref 11.5–15.5)
WBC: 115.7 10*3/uL (ref 4.0–10.5)

## 2017-09-27 LAB — LEGIONELLA PNEUMOPHILA SEROGP 1 UR AG: L. pneumophila Serogp 1 Ur Ag: NEGATIVE

## 2017-09-27 LAB — PHOSPHORUS: PHOSPHORUS: 2.6 mg/dL (ref 2.5–4.6)

## 2017-09-27 LAB — URIC ACID: Uric Acid, Serum: 3.3 mg/dL (ref 2.3–6.6)

## 2017-09-27 MED ORDER — PREDNISONE 10 MG PO TABS
10.0000 mg | ORAL_TABLET | Freq: Every day | ORAL | Status: DC
  Administered 2017-09-28: 10 mg via ORAL
  Filled 2017-09-27: qty 1

## 2017-09-27 NOTE — Progress Notes (Signed)
Audrey Gross   DOB:1960-12-26   VZ#:[id number]    Subjective: She is breathing better.  Her oxygenation has improved despite reduce need to 2 L.  Denies cough.  Objective:  Vitals:   09/27/17 0542 09/27/17 1414  BP: 132/72 111/66  Pulse: 90 86  Resp: 20 18  Temp: 98.4 F (36.9 C) 98.4 F (36.9 C)  SpO2: 92% 94%     Intake/Output Summary (Last 24 hours) at 09/27/2017 1542 Last data filed at 09/27/2017 1300 Gross per 24 hour  Intake 900 ml  Output 400 ml  Net 500 ml    GENERAL:alert, no distress and comfortable SKIN: skin color, texture, turgor are normal, no rashes or significant lesions EYES: normal, Conjunctiva are pink and non-injected, sclera clear OROPHARYNX:no exudate, no erythema and lips, buccal mucosa, and tongue normal  NECK: supple, thyroid normal size, non-tender, without nodularity LYMPH:  no palpable lymphadenopathy in the cervical, axillary or inguinal LUNGS: clear to auscultation and percussion with normal breathing effort HEART: regular rate & rhythm and no murmurs and no lower extremity edema ABDOMEN:abdomen soft, non-tender and normal bowel sounds Musculoskeletal:no cyanosis of digits and no clubbing  NEURO: alert & oriented x 3 with fluent speech, no focal motor/sensory deficits   Labs:  Lab Results  Component Value Date   WBC 115.7 (HH) 09/27/2017   HGB 9.7 (L) 09/27/2017   HCT 30.5 (L) 09/27/2017   MCV 85.9 09/27/2017   PLT 283 09/27/2017   NEUTROABS 6.9 09/27/2017    Lab Results  Component Value Date   NA 138 09/27/2017   K 3.8 09/27/2017   CL 106 09/27/2017   CO2 25 09/27/2017    Assessment & Plan:   Locally advanced CLL/small lymphocytic lymphoma We discussed the importance of proceeding with treatment because I believe her symptoms are related to leukemic infiltration from CLL. We discussed the risks, benefits, side effects of venetoclax including high risk of tumor lysis syndrome, renal failure, allergic reaction and she agreed to  proceed She has received first dose ofVenetoclaxon September 24, 2017 Starting 09/26/17, we are escalating the dose to 20 mg daily.  No signs of TLS. I plan to reduce the dose of prednisone to 10 mg daily (baseline level) tomorrow Next dose escalation is planned for October 02, 2017  High risk tumor lysis syndrome She will continue allopurinol So far, no signs of tumor lysis syndrome Continue allopurinol  Fever, abnormal imaging study, shortness of breath So far, cultures are negative I recommend de-escalating antibiotics treatment.  I recommend discontinuation of antibiotics tomorrow upon discharge.  She has no fever over 24 hours.  She may need home oxygen therapy  History of chronic atrial fibrillation and cardiomyopathy She has no signs of congestive heart failure now She will continue telemetry monitoring BNP level was normal yesterday Plan to slowly wean her off oxygen the next few days if possible.  If not possible, she may go home on oxygen  Poor venous access She hasPICC line placement12/20/18 The patient needs intensive blood count monitoring I recommend keeping her PICC line for next admission Her husband can be taught about PICC line flushes if needed  CODE STATUS Full code  Discharge planning Not ready to go home today.  I think it is likely she will be discharged tomorrow I have discussed this with the patient, her husband and primary service   Heath Lark, MD 09/27/2017  3:42 PM

## 2017-09-27 NOTE — Progress Notes (Signed)
Pharmacy Antibiotic Note Audrey Gross is a 56 y.o. 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 Pharmacy is consulted for cefepime dosing.  Today, 09/27/2017: Day # 6 antibiotics Afebrile WBC 115 Scr 0.77, stable  Plan: Continue Azithromycin per MD - last dose on 12/24 Continue Cefepime 2g IV q12h - last dose on 12/25 F/u renal function, cultures and clinical course  Height: 5\' 7"  (170.2 cm) Weight: 185 lb (83.9 kg) IBW/kg (Calculated) : 61.6  Temp (24hrs), Avg:98 F (36.7 C), Min:97.3 F (36.3 C), Max:98.4 F (36.9 C)  Recent Labs  Lab 09/22/17 1940 09/22/17 2057 09/23/17 0333 09/24/17 0801  09/25/17 0451 09/26/17 0401 09/26/17 1145 09/26/17 1600 09/26/17 2154 09/27/17 0438  WBC  --   --  108.4* 111.2*  --  123.1* 99.5*  --   --   --  115.7*  CREATININE  --   --  0.83 0.81   < > 0.82 0.79 0.97 1.19* 0.83 0.77  LATICACIDVEN 1.96* 1.57  --   --   --   --   --   --   --   --   --    < > = values in this interval not displayed.    Estimated Creatinine Clearance: 87.4 mL/min (by C-G formula based on SCr of 0.77 mg/dL).    No Known Allergies  Antimicrobials this admission: 12/18 azithromax >> (12/25) 12/18 Cefepime >> (12/26) 12/18 vancomycin >>12/22  Microbiology results: 12/18 bcx: ngtd 12/18 Resp PCR: none detected  Thank you for allowing pharmacy to be a part of this patient's care.  Gretta Arab PharmD, BCPS Pager [phone] 09/27/2017 8:13 AM

## 2017-09-27 NOTE — Progress Notes (Signed)
[facility] PROGRESS NOTE    Progress Note  Audrey Gross  SHF:[id number] DOB: Aug 21, 1961 DOA: 09/22/2017 PCP: Janith Lima, MD     Brief Narrative:   Audrey Gross is an 56 y.o. female [place] of paroxysmal atrial fibrillation on Eliquis, refractory CLL, chronic systolic heart failure and hypertension presents to the hospital with 3 days of worsening shortness of breath, oncology was consulted for suspected worsening CLL and tumor burden, will start empiric antibiotics for sepsis picture pneumonia.  09/27/17 - Patient seen alongside patient's Husband. Discussed extensively with Dr. Alvy Bimler. Likely DC in am. Oxygen requirement is down to 2L/Min. No fever or chill. History of coughing, but non productive.  Assessment/Plan:   Acute respiratory failure with hypoxia (HCC):/Pneumonia/CLL (chronic lymphocytic leukemia) (HCC) - Oxygen requirement is down from 6L/min to 2L/min. - Most likely related to CLL and high WBC (Discussed with Dr. Alvy Bimler, Oncologist). Likely  DC in am. She was started on chemotherapy by oncologist, they recommended to monitor for  tumor lysis syndrome, as this medication high incidence tumor lysis syndrome. Likely home on oral antibiotics in am. She has been started on her antineoplastic drugs, electrolytes are stable continue to monitor uric acid  Essential hypertension: Continue beta blockers and Entresto.  PAF (paroxysmal atrial fibrillation) (HCC) Rate controlled continue Eliquis.  Chronic systolic heart failure (HCC) 2D echo showed an ejection fraction of 30-35%. Seems euvolemic at this point, she has had minimal oral intake, the nurse relates that she has been nauseated and patient does not want to eat or drink. Will resume oral Lasix, continue to check B-met daily.   DVT prophylaxis: lovenox Family Communication:none Disposition Plan/Barrier to D/C: unable to detemrine Code Status:     Code Status Orders  (From admission, onward)        Start     Ordered   09/22/17 2053  Full code  Continuous     09/22/17 2053    Code Status History    Date Active Date Inactive Code Status Order ID Comments User Context   07/15/2017 19:29 07/19/2017 18:11 Full Code [id number]  Eugenie Filler, MD Inpatient   02/26/2017 16:44 02/28/2017 19:46 Full Code [id number]  Delos Haring, PA-C ED        IV Access:    Peripheral IV   Procedures and diagnostic studies:   No results found.   Medical Consultants:    None.  Anti-Infectives:   IV vancomycin and cefepime.  Subjective:    CHISTINE Gross but she relates she SOB, but improved compare to yesterday  Objective:    Vitals:   09/26/17 1400 09/26/17 2116 09/27/17 0542 09/27/17 1414  BP: 103/65 126/71 132/72 111/66  Pulse: 88 88 90 86  Resp: '20 17 20 18  ' Temp: (!) 97.3 F (36.3 C) 98.2 F (36.8 C) 98.4 F (36.9 C) 98.4 F (36.9 C)  TempSrc: Oral Oral Oral Oral  SpO2: 93% 96% 92% 94%  Weight:      Height:        Intake/Output Summary (Last 24 hours) at 09/27/2017 1615 Last data filed at 09/27/2017 1300 Gross per 24 hour  Intake 900 ml  Output 400 ml  Net 500 ml   Filed Weights   09/22/17 1617 09/22/17 2020  Weight: 83.9 kg (185 lb) 83.9 kg (185 lb)    Exam: General exam: In no acute distress. Respiratory system: Good air movement and clear to auscultation. Cardiovascular system: S1 & S2 heard, RRR. - JVD Gastrointestinal system: Abdomen  is nondistended, soft and nontender.  Central nervous system: Alert and oriented. No focal neurological deficits. Extremities: No lower extremity edema. Skin: No rashes, lesions or ulcers    Data Reviewed:    Labs: Basic Metabolic Panel: Recent Labs  Lab 09/26/17 0401 09/26/17 1145 09/26/17 1600 09/26/17 2154 09/27/17 0438  NA 140 135 138 139 138  K 3.8 4.1 4.3 4.6 3.8  CL 109 104 107 107 106  CO2 24 20* '22 25 25  ' GLUCOSE 107* 199* 201* 148* 89  BUN '20 17 20 ' 24* 23*  CREATININE 0.79 0.97 1.19* 0.83  0.77  CALCIUM 9.3 9.3 9.1 9.3 8.9  PHOS 3.4 2.2* 3.4 3.1 2.6   GFR Estimated Creatinine Clearance: 87.4 mL/min (by C-G formula based on SCr of 0.77 mg/dL). Liver Function Tests: Recent Labs  Lab 09/26/17 0401 09/26/17 1145 09/26/17 1600 09/26/17 2154 09/27/17 0438  AST 34 38 34 30 33  ALT '14 15 16 15 18  ' ALKPHOS 44 49 54 47 44  BILITOT 0.8 0.9 0.9 0.9 0.6  PROT 5.7* 6.0* 6.4* 5.7* 5.7*  ALBUMIN 2.9* 3.0* 3.1* 2.9* 2.7*   No results for input(s): LIPASE, AMYLASE in the last 168 hours. No results for input(s): AMMONIA in the last 168 hours. Coagulation profile No results for input(s): INR, PROTIME in the last 168 hours.  CBC: Recent Labs  Lab 09/23/17 0333 09/24/17 0801 09/25/17 0451 09/26/17 0401 09/27/17 0438  WBC 108.4* 111.2* 123.1* 99.5* 115.7*  NEUTROABS 7.6 11.1* 11.1* 16.9* 6.9  HGB 10.0* 9.9* 10.5* 10.0* 9.7*  HCT 31.1* 30.2* 31.8* 30.9* 30.5*  MCV 85.7 85.1 84.6 85.6 85.9  PLT 245 259 272 278 283   Cardiac Enzymes: No results for input(s): CKTOTAL, CKMB, CKMBINDEX, TROPONINI in the last 168 hours. BNP (last 3 results) No results for input(s): PROBNP in the last 8760 hours. CBG: No results for input(s): GLUCAP in the last 168 hours. D-Dimer: No results for input(s): DDIMER in the last 72 hours. Hgb A1c: No results for input(s): HGBA1C in the last 72 hours. Lipid Profile: No results for input(s): CHOL, HDL, LDLCALC, TRIG, CHOLHDL, LDLDIRECT in the last 72 hours. Thyroid function studies: No results for input(s): TSH, T4TOTAL, T3FREE, THYROIDAB in the last 72 hours.  Invalid input(s): FREET3 Anemia work up: No results for input(s): VITAMINB12, FOLATE, FERRITIN, TIBC, IRON, RETICCTPCT in the last 72 hours. Sepsis Labs: Recent Labs  Lab 09/22/17 1940 09/22/17 2057  09/24/17 0801 09/25/17 0451 09/26/17 0401 09/27/17 0438  WBC  --   --    < > 111.2* 123.1* 99.5* 115.7*  LATICACIDVEN 1.96* 1.57  --   --   --   --   --    < > = values in this  interval not displayed.   Microbiology Recent Results (from the past 240 hour(s))  TECHNOLOGIST REVIEW     Status: None   Collection Time: 09/21/17  9:50 AM  Result Value Ref Range Status   Technologist Review Variant lymphs present, smudge cells  Final  Culture, blood (routine x 2)     Status: None   Collection Time: 09/22/17  6:38 PM  Result Value Ref Range Status   Specimen Description BLOOD LEFT ANTECUBITAL  Final   Special Requests   Final    Blood Culture adequate volume BOTTLES DRAWN AEROBIC AND ANAEROBIC   Culture NO GROWTH 5 DAYS  Final   Report Status 09/27/2017 FINAL  Final  Culture, blood (routine x 2)     Status: None  Collection Time: 09/22/17  6:43 PM  Result Value Ref Range Status   Specimen Description BLOOD RIGHT HAND  Final   Special Requests Blood Culture adequate volume IN PEDIATRIC BOTTLE  Final   Culture NO GROWTH 5 DAYS  Final   Report Status 09/27/2017 FINAL  Final  Respiratory Panel by PCR     Status: None   Collection Time: 09/22/17  8:54 PM  Result Value Ref Range Status   Adenovirus NOT DETECTED NOT DETECTED Final   Coronavirus 229E NOT DETECTED NOT DETECTED Final   Coronavirus HKU1 NOT DETECTED NOT DETECTED Final   Coronavirus NL63 NOT DETECTED NOT DETECTED Final   Coronavirus OC43 NOT DETECTED NOT DETECTED Final   Metapneumovirus NOT DETECTED NOT DETECTED Final   Rhinovirus / Enterovirus NOT DETECTED NOT DETECTED Final   Influenza A NOT DETECTED NOT DETECTED Final   Influenza B NOT DETECTED NOT DETECTED Final   Parainfluenza Virus 1 NOT DETECTED NOT DETECTED Final   Parainfluenza Virus 2 NOT DETECTED NOT DETECTED Final   Parainfluenza Virus 3 NOT DETECTED NOT DETECTED Final   Parainfluenza Virus 4 NOT DETECTED NOT DETECTED Final   Respiratory Syncytial Virus NOT DETECTED NOT DETECTED Final   Bordetella pertussis NOT DETECTED NOT DETECTED Final   Chlamydophila pneumoniae NOT DETECTED NOT DETECTED Final   Mycoplasma pneumoniae NOT DETECTED NOT  DETECTED Final     Medications:   . acyclovir  400 mg Oral Daily  . allopurinol  300 mg Oral Daily  . apixaban  5 mg Oral BID  . cholecalciferol  1,000 Units Oral Q breakfast  . diltiazem  240 mg Oral Daily  . feeding supplement (ENSURE ENLIVE)  237 mL Oral BID BM  . furosemide  40 mg Oral Daily  . mouth rinse  15 mL Mouth Rinse BID  . metoprolol tartrate  37.5 mg Oral BID  . [START ON 09/28/2017] predniSONE  10 mg Oral Q breakfast  . sacubitril-valsartan  1 tablet Oral BID  . sodium chloride flush  3 mL Intravenous Q12H  . Venetoclax  20 mg Oral Q breakfast   Continuous Infusions: . sodium chloride    . azithromycin Stopped (09/26/17 2355)  . ceFEPime (MAXIPIME) IV 2 g (09/27/17 0626)      LOS: 5 days   Bonnell Public  [facility] Pager [phone]  *Please refer to [email or web address], password [email or web address] to get updated schedule on who will round on this patient, as hospitalists switch teams weekly. If 7PM-7AM, please contact night-coverage at [email or web address], password [email or web address] for any overnight needs.  09/27/2017, 4:15 PM

## 2017-09-28 LAB — CBC WITH DIFFERENTIAL/PLATELET
BASOS ABS: 0 10*3/uL (ref 0.0–0.1)
Basophils Relative: 0 %
EOS PCT: 0 %
Eosinophils Absolute: 0 10*3/uL (ref 0.0–0.7)
HEMATOCRIT: 30.5 % — AB (ref 36.0–46.0)
HEMOGLOBIN: 9.7 g/dL — AB (ref 12.0–15.0)
LYMPHS PCT: 88 %
Lymphs Abs: 100.1 10*3/uL — ABNORMAL HIGH (ref 0.7–4.0)
MCH: 27.5 pg (ref 26.0–34.0)
MCHC: 31.8 g/dL (ref 30.0–36.0)
MCV: 86.4 fL (ref 78.0–100.0)
MONOS PCT: 1 %
Monocytes Absolute: 1.1 10*3/uL — ABNORMAL HIGH (ref 0.1–1.0)
Neutro Abs: 12.5 10*3/uL — ABNORMAL HIGH (ref 1.7–7.7)
Neutrophils Relative %: 11 %
Platelets: 284 10*3/uL (ref 150–400)
RBC: 3.53 MIL/uL — AB (ref 3.87–5.11)
RDW: 17.6 % — ABNORMAL HIGH (ref 11.5–15.5)
WBC: 113.7 10*3/uL — AB (ref 4.0–10.5)

## 2017-09-28 LAB — COMPREHENSIVE METABOLIC PANEL
ALBUMIN: 2.9 g/dL — AB (ref 3.5–5.0)
ALK PHOS: 45 U/L (ref 38–126)
ALT: 19 U/L (ref 14–54)
ANION GAP: 8 (ref 5–15)
AST: 33 U/L (ref 15–41)
BUN: 19 mg/dL (ref 6–20)
CHLORIDE: 108 mmol/L (ref 101–111)
CO2: 23 mmol/L (ref 22–32)
CREATININE: 0.8 mg/dL (ref 0.44–1.00)
Calcium: 8.8 mg/dL — ABNORMAL LOW (ref 8.9–10.3)
GFR calc non Af Amer: >60 mL/min (ref >60)
GLUCOSE: 88 mg/dL (ref 65–99)
Potassium: 4 mmol/L (ref 3.5–5.1)
SODIUM: 139 mmol/L (ref 135–145)
Total Bilirubin: 0.9 mg/dL (ref 0.3–1.2)
Total Protein: 5.7 g/dL — ABNORMAL LOW (ref 6.5–8.1)

## 2017-09-28 LAB — PHOSPHORUS: Phosphorus: 2.6 mg/dL (ref 2.5–4.6)

## 2017-09-28 LAB — URIC ACID: URIC ACID, SERUM: 3.2 mg/dL (ref 2.3–6.6)

## 2017-09-28 MED ORDER — LIP MEDEX EX OINT
TOPICAL_OINTMENT | CUTANEOUS | Status: AC
  Filled 2017-09-28: qty 7

## 2017-09-28 MED ORDER — HEPARIN SOD (PORK) LOCK FLUSH 100 UNIT/ML IV SOLN
250.0000 [IU] | INTRAVENOUS | Status: AC | PRN
  Administered 2017-09-28: 250 [IU]

## 2017-09-28 MED ORDER — VENETOCLAX 10 MG PO TABS: 20.0000 mg | ORAL_TABLET | Freq: Every day | ORAL | 0 refills | Status: DC

## 2017-09-28 MED ORDER — ENSURE ENLIVE PO LIQD: 237.0000 mL | Freq: Two times a day (BID) | ORAL | 12 refills | Status: DC

## 2017-09-28 NOTE — Progress Notes (Signed)
Date: September 28, 2017 Chart review for discharge needs:  None found for case management. Patient has no questions concerning post hospital care. 

## 2017-09-28 NOTE — Progress Notes (Signed)
SATURATION QUALIFICATIONS: (This note is used to comply with regulatory documentation for home oxygen)  Patient Saturations on Room Air at Rest = 89%  Patient Saturations on Room Air while Ambulating = 86%  Patient Saturations on 2 Liters of oxygen while Ambulating = 93%  Please briefly explain why patient needs home oxygen: Desaturating

## 2017-09-28 NOTE — Discharge Summary (Signed)
Physician Discharge Summary  Patient ID: Audrey Gross MRN: [id number] DOB/AGE: 04-Jan-1961 56 y.o.  Admit date: 09/22/2017 Discharge date: 09/28/2017  Admission Diagnoses:  Discharge Diagnoses:  Principal Problem:   Pneumonia Active Problems:   CLL (chronic lymphocytic leukemia) (HCC)   Hypertension   PAF (paroxysmal atrial fibrillation) (HCC)   Chronic systolic heart failure (HCC)   Acute respiratory failure with hypoxia (HCC)   SOB (shortness of breath)   Discharged Condition: stable  Hospital Course: Patient is a 56 year old female with past medical history significant for attrial fibrillation on Eliquis, refractory CLL, chronic systolic heart failure and hypertension. Patient was admitted to the hospital with 3- day history of worsening shortness of breath. Work up done was initially said to be suggestive of pneumonia, and patient was started on antibiotics. CT Chest done revealed that the imaging finding could be secondary to Pulmonary leukostasis, likely secondary to CLL with significantly elevated WBC. Patient was started on Venetoclax, and patient was monitored closely for tumor lysis syndrome. No tumor lysis noted. Patient required about 6L/Min of supplemental oxygen at some point. Oxygen supplement is down to 2L/Min, and patient will be discharged back home on current supplemental oxygen. Oncologist, Dr. Alvy Bimler assisted in directing patient's care. Oncology plans to admit patient on back to the hospital in 4 days for further adjustment of CLL treatment.  Consults: Oncology  Significant Diagnostic Studies: radiology: CT scan: Chest  Treatments: Antibiotics and Venetoclax  Discharge Exam: Blood pressure 126/85, pulse 95, temperature 98.6 F (37 C), temperature source Oral, resp. rate 19, height 5\' 7"  (1.702 m), weight 83.9 kg (185 lb), SpO2 93 %.   Disposition: 01-Home or Self Care  Discharge Instructions    Call MD for:   Complete by:  As directed    Call MD for  worsening of symptoms   Diet - low sodium heart healthy   Complete by:  As directed    Increase activity slowly   Complete by:  As directed      Allergies as of 09/28/2017   No Known Allergies     Medication List    STOP taking these medications   prochlorperazine 10 MG tablet Commonly known as:  COMPAZINE   spironolactone 25 MG tablet Commonly known as:  ALDACTONE     TAKE these medications   acyclovir 400 MG tablet Commonly known as:  ZOVIRAX TAKE 1 TABLET BY MOUTH EVERY DAY What changed:    how much to take  how to take this  when to take this   allopurinol 300 MG tablet Commonly known as:  ZYLOPRIM Take 1 tablet (300 mg total) by mouth daily.   diltiazem 2 % Gel Apply 1 application topically 3 (three) times daily. Use for 6 weeks What changed:    when to take this  additional instructions   diltiazem 240 MG 24 hr capsule Commonly known as:  CARDIZEM CD Take 1 capsule (240 mg total) daily by mouth.   ELIQUIS 5 MG Tabs tablet Generic drug:  apixaban TAKE 1 TABLET BY MOUTH TWICE A DAY What changed:    how much to take  how to take this  when to take this   feeding supplement (ENSURE ENLIVE) Liqd Take 237 mLs by mouth 2 (two) times daily between meals.   furosemide 40 MG tablet Commonly known as:  LASIX TAKE 1 TABLET BY MOUTH EVERY DAY What changed:    how much to take  how to take this  when to take this  metoprolol tartrate 25 MG tablet Commonly known as:  LOPRESSOR Take 1.5 tablets (37.5 mg total) by mouth 2 (two) times daily.   ondansetron 8 MG tablet Commonly known as:  ZOFRAN Take 1 tablet (8 mg total) by mouth every 8 (eight) hours as needed for refractory nausea / vomiting.   predniSONE 10 MG tablet Commonly known as:  DELTASONE TAKE 1 TABLET BY MOUTH DAILY WITH BREAKFAST What changed:    how much to take  how to take this  when to take this   sacubitril-valsartan 24-26 MG Commonly known as:  ENTRESTO Take 1  tablet by mouth 2 (two) times daily.   Venetoclax 10 MG Tabs Commonly known as:  VENCLEXTA Take 20 mg by mouth daily with breakfast. Start taking on:  09/29/2017   Vitamin D-3 1000 units Caps Take 1,000 Units by mouth daily with breakfast.            Durable Medical Equipment  (From admission, onward)        Start     Ordered   09/28/17 1157  For home use only DME oxygen  Once    Question Answer Comment  Mode or (Route) Nasal cannula   Liters per Minute 2   Frequency Continuous (stationary and portable oxygen unit needed)   Oxygen delivery system Gas      09/28/17 1156       Signed: Bonnell Public 09/28/2017, 11:57 AM

## 2017-09-28 NOTE — Progress Notes (Signed)
Audrey Gross   DOB:04-Sep-1961   JM#:[id number]    Subjective: She continues to have shortness of breath.  Afebrile.  Denies cough.  Objective:  Vitals:   09/27/17 2159 09/28/17 0608  BP: 122/70 126/85  Pulse: 81 95  Resp: 18 19  Temp: 98.2 F (36.8 C) 98.6 F (37 C)  SpO2: 90% 93%     Intake/Output Summary (Last 24 hours) at 09/28/2017 0843 Last data filed at 09/27/2017 1700 Gross per 24 hour  Intake 480 ml  Output 1000 ml  Net -520 ml    GENERAL:alert, no distress and comfortable SKIN: skin color, texture, turgor are normal, no rashes or significant lesions EYES: normal, Conjunctiva are pink and non-injected, sclera clear OROPHARYNX:no exudate, no erythema and lips, buccal mucosa, and tongue normal  NECK: supple, thyroid normal size, non-tender, without nodularity LYMPH: She has persistent neck lymphadenopathy, stable LUNGS: clear to auscultation and percussion with normal breathing effort HEART: regular rate & rhythm and no murmurs and no lower extremity edema ABDOMEN:abdomen soft, non-tender and normal bowel sounds Musculoskeletal:no cyanosis of digits and no clubbing  NEURO: alert & oriented x 3 with fluent speech, no focal motor/sensory deficits   Labs:  Lab Results  Component Value Date   WBC 113.7 (HH) 09/28/2017   HGB 9.7 (L) 09/28/2017   HCT 30.5 (L) 09/28/2017   MCV 86.4 09/28/2017   PLT 284 09/28/2017   NEUTROABS 12.5 (H) 09/28/2017    Lab Results  Component Value Date   NA 139 09/28/2017   K 4.0 09/28/2017   CL 108 09/28/2017   CO2 23 09/28/2017    Assessment & Plan:   Locally advanced CLL/small lymphocytic lymphoma We discussed the importance of proceeding with treatment because I believe her symptoms are related to leukemic infiltration from CLL. We discussed the risks, benefits, side effects of venetoclax including high risk of tumor lysis syndrome, renal failure, allergic reaction and she agreed to proceed She has received first dose  ofVenetoclaxon September 24, 2017 Starting 09/26/17, we are escalating the dose to 20 mg daily. No signs of TLS. I plan to reduce the dose of prednisone to 10 mg daily (baseline level)upon discharge Next dose escalation is planned for October 02, 2017 (to be direct admit from outpatient), unless she stays through the week for other reasons  High risk tumor lysis syndrome She will continue allopurinol So far, no signs of tumor lysis syndrome  Fever, abnormal imaging study, shortness of breath So far, cultures are negative I recommend de-escalating antibiotics treatment.  I recommend discontinuation of antibiotics tomorrow upon discharge.  She has no fever over 24 hours.  She may need home oxygen therapy  History of chronic atrial fibrillation and cardiomyopathy She has no signs of congestive heart failure now She will continue telemetry monitoring BNP level was normal recently Plan to slowly wean her off oxygen the next few days if possible.  If not possible, she may go home on oxygen  Poor venous access She hasPICC line placement12/20/18 The patient needs intensive blood count monitoring I recommend keeping her PICC line for next admission Her husband can be taught about PICC line flushes if needed  CODE STATUS Full code  Discharge planning Defer to primary service, hopefully today I will arrange admission for Friday If the patient stays in the hospital for other reasons for the next few days, Dr. Lindi Adie will resume care on September 30, 2017  Heath Lark, MD 09/28/2017  8:43 AM

## 2017-09-30 ENCOUNTER — Telehealth: Payer: Self-pay

## 2017-09-30 NOTE — Telephone Encounter (Signed)
Called patient placement and requested bed for 12-28 for admission to [facility].

## 2017-10-01 ENCOUNTER — Telehealth: Payer: Self-pay

## 2017-10-01 DIAGNOSIS — J189 Pneumonia, unspecified organism: Secondary | ICD-10-CM | POA: Diagnosis not present

## 2017-10-01 NOTE — Telephone Encounter (Signed)
Opened in error

## 2017-10-01 NOTE — Telephone Encounter (Signed)
Called patient placement and requested telemetry bed for 12/28 admission per Dr. Lindi Adie.

## 2017-10-02 ENCOUNTER — Inpatient Hospital Stay (HOSPITAL_COMMUNITY)
Admission: AD | Admit: 2017-10-02 | Discharge: 2017-10-05 | DRG: 842 | Disposition: A | Discharge status: Home / Self Care | Payer: BLUE CROSS/BLUE SHIELD | Source: Ambulatory Visit | Attending: Hematology and Oncology | Admitting: Hematology and Oncology

## 2017-10-02 ENCOUNTER — Encounter (HOSPITAL_COMMUNITY): Payer: Self-pay | Admitting: *Deleted

## 2017-10-02 ENCOUNTER — Other Ambulatory Visit: Payer: Self-pay

## 2017-10-02 ENCOUNTER — Other Ambulatory Visit: Payer: Self-pay | Admitting: Pharmacist

## 2017-10-02 ENCOUNTER — Inpatient Hospital Stay (HOSPITAL_COMMUNITY): Payer: BLUE CROSS/BLUE SHIELD

## 2017-10-02 DIAGNOSIS — D509 Iron deficiency anemia, unspecified: Secondary | ICD-10-CM | POA: Diagnosis not present

## 2017-10-02 DIAGNOSIS — E785 Hyperlipidemia, unspecified: Secondary | ICD-10-CM | POA: Diagnosis present

## 2017-10-02 DIAGNOSIS — Z7901 Long term (current) use of anticoagulants: Secondary | ICD-10-CM

## 2017-10-02 DIAGNOSIS — I110 Hypertensive heart disease with heart failure: Secondary | ICD-10-CM | POA: Diagnosis present

## 2017-10-02 DIAGNOSIS — G4700 Insomnia, unspecified: Secondary | ICD-10-CM | POA: Diagnosis not present

## 2017-10-02 DIAGNOSIS — I4891 Unspecified atrial fibrillation: Secondary | ICD-10-CM

## 2017-10-02 DIAGNOSIS — Z8249 Family history of ischemic heart disease and other diseases of the circulatory system: Secondary | ICD-10-CM

## 2017-10-02 DIAGNOSIS — R0602 Shortness of breath: Secondary | ICD-10-CM | POA: Diagnosis not present

## 2017-10-02 DIAGNOSIS — F419 Anxiety disorder, unspecified: Secondary | ICD-10-CM | POA: Diagnosis not present

## 2017-10-02 DIAGNOSIS — D630 Anemia in neoplastic disease: Secondary | ICD-10-CM | POA: Diagnosis present

## 2017-10-02 DIAGNOSIS — F329 Major depressive disorder, single episode, unspecified: Secondary | ICD-10-CM | POA: Diagnosis not present

## 2017-10-02 DIAGNOSIS — I482 Chronic atrial fibrillation: Secondary | ICD-10-CM | POA: Diagnosis not present

## 2017-10-02 DIAGNOSIS — E883 Tumor lysis syndrome: Secondary | ICD-10-CM | POA: Diagnosis not present

## 2017-10-02 DIAGNOSIS — C9110 Chronic lymphocytic leukemia of B-cell type not having achieved remission: Principal | ICD-10-CM

## 2017-10-02 DIAGNOSIS — I5022 Chronic systolic (congestive) heart failure: Secondary | ICD-10-CM

## 2017-10-02 DIAGNOSIS — I509 Heart failure, unspecified: Secondary | ICD-10-CM

## 2017-10-02 LAB — COMPREHENSIVE METABOLIC PANEL
ALBUMIN: 3.2 g/dL — AB (ref 3.5–5.0)
ALT: 18 U/L (ref 14–54)
AST: 31 U/L (ref 15–41)
Alkaline Phosphatase: 62 U/L (ref 38–126)
Anion gap: 6 (ref 5–15)
BUN: 11 mg/dL (ref 6–20)
CHLORIDE: 110 mmol/L (ref 101–111)
CO2: 23 mmol/L (ref 22–32)
CREATININE: 0.67 mg/dL (ref 0.44–1.00)
Calcium: 9.4 mg/dL (ref 8.9–10.3)
GFR calc Af Amer: >60 mL/min (ref >60)
GFR calc non Af Amer: >60 mL/min (ref >60)
Glucose, Bld: 104 mg/dL — ABNORMAL HIGH (ref 65–99)
Potassium: 3.8 mmol/L (ref 3.5–5.1)
SODIUM: 139 mmol/L (ref 135–145)
Total Bilirubin: 1.2 mg/dL (ref 0.3–1.2)
Total Protein: 6.4 g/dL — ABNORMAL LOW (ref 6.5–8.1)

## 2017-10-02 LAB — CBC WITH DIFFERENTIAL/PLATELET
BASOS ABS: 0 10*3/uL (ref 0.0–0.1)
BASOS PCT: 0 %
Eosinophils Absolute: 0 10*3/uL (ref 0.0–0.7)
Eosinophils Relative: 0 %
HEMATOCRIT: 33.8 % — AB (ref 36.0–46.0)
HEMOGLOBIN: 10.5 g/dL — AB (ref 12.0–15.0)
LYMPHS PCT: 90 %
Lymphs Abs: 142.4 10*3/uL — ABNORMAL HIGH (ref 0.7–4.0)
MCH: 27.2 pg (ref 26.0–34.0)
MCHC: 31.1 g/dL (ref 30.0–36.0)
MCV: 87.6 fL (ref 78.0–100.0)
MONOS PCT: 3 %
Monocytes Absolute: 4.8 10*3/uL — ABNORMAL HIGH (ref 0.1–1.0)
NEUTROS ABS: 11.1 10*3/uL — AB (ref 1.7–7.7)
NEUTROS PCT: 7 %
Platelets: 302 10*3/uL (ref 150–400)
RBC: 3.86 MIL/uL — ABNORMAL LOW (ref 3.87–5.11)
RDW: 17.5 % — ABNORMAL HIGH (ref 11.5–15.5)
WBC: 158.3 10*3/uL (ref 4.0–10.5)

## 2017-10-02 LAB — URIC ACID: Uric Acid, Serum: 3.3 mg/dL (ref 2.3–6.6)

## 2017-10-02 LAB — PHOSPHORUS: Phosphorus: 2.8 mg/dL (ref 2.5–4.6)

## 2017-10-02 MED ORDER — ACETAMINOPHEN 325 MG PO TABS: 650.0000 mg | ORAL_TABLET | ORAL | Status: DC | PRN

## 2017-10-02 MED ORDER — SODIUM CHLORIDE 0.9 % IV SOLN
INTRAVENOUS | Status: DC
  Administered 2017-10-02: 15:00:00 via INTRAVENOUS

## 2017-10-02 MED ORDER — VENETOCLAX 50 MG PO TABS
50.0000 mg | ORAL_TABLET | Freq: Every day | ORAL | Status: DC
  Administered 2017-10-03 – 2017-10-05 (×3): 50 mg via ORAL

## 2017-10-02 MED ORDER — ONDANSETRON HCL 4 MG/2ML IJ SOLN: 4.0000 mg | Freq: Three times a day (TID) | INTRAMUSCULAR | Status: DC | PRN

## 2017-10-02 MED ORDER — ONDANSETRON HCL 4 MG PO TABS: 4.0000 mg | ORAL_TABLET | Freq: Three times a day (TID) | ORAL | Status: DC | PRN

## 2017-10-02 MED ORDER — ENOXAPARIN SODIUM 40 MG/0.4ML ~~LOC~~ SOLN
40.0000 mg | SUBCUTANEOUS | Status: DC
Start: 2017-10-02 — End: 2017-10-02

## 2017-10-02 MED ORDER — ALLOPURINOL 300 MG PO TABS
300.0000 mg | ORAL_TABLET | Freq: Every day | ORAL | Status: DC
  Administered 2017-10-03 – 2017-10-05 (×3): 300 mg via ORAL
  Filled 2017-10-02 (×3): qty 1

## 2017-10-02 MED ORDER — ALUM & MAG HYDROXIDE-SIMETH 200-200-20 MG/5ML PO SUSP: 60.0000 mL | ORAL | Status: DC | PRN

## 2017-10-02 MED ORDER — VENETOCLAX 50 MG PO TABS: 50.0000 mg | ORAL_TABLET | Freq: Every day | ORAL | Status: DC

## 2017-10-02 MED ORDER — ACYCLOVIR 400 MG PO TABS
400.0000 mg | ORAL_TABLET | Freq: Every day | ORAL | Status: DC
  Administered 2017-10-03 – 2017-10-05 (×3): 400 mg via ORAL
  Filled 2017-10-02 (×3): qty 1

## 2017-10-02 MED ORDER — APIXABAN 5 MG PO TABS: 5.0000 mg | ORAL_TABLET | Freq: Two times a day (BID) | ORAL | Status: DC

## 2017-10-02 MED ORDER — HYDROCORTISONE 2.5 % RE CREA
1.0000 "application " | TOPICAL_CREAM | Freq: Two times a day (BID) | RECTAL | Status: DC | PRN
  Filled 2017-10-02: qty 28.35

## 2017-10-02 MED ORDER — PREDNISONE 10 MG PO TABS
10.0000 mg | ORAL_TABLET | Freq: Every day | ORAL | Status: DC
  Administered 2017-10-03 – 2017-10-05 (×3): 10 mg via ORAL
  Filled 2017-10-02 (×3): qty 1

## 2017-10-02 MED ORDER — ACYCLOVIR 400 MG PO TABS: 400.0000 mg | ORAL_TABLET | Freq: Every day | ORAL | Status: DC

## 2017-10-02 MED ORDER — APIXABAN 5 MG PO TABS
5.0000 mg | ORAL_TABLET | Freq: Two times a day (BID) | ORAL | Status: DC
  Administered 2017-10-02 – 2017-10-05 (×6): 5 mg via ORAL
  Filled 2017-10-02 (×6): qty 1

## 2017-10-02 MED ORDER — ALLOPURINOL 300 MG PO TABS: 300.0000 mg | ORAL_TABLET | Freq: Every day | ORAL | Status: DC

## 2017-10-02 MED ORDER — METOPROLOL TARTRATE 25 MG PO TABS
37.5000 mg | ORAL_TABLET | Freq: Two times a day (BID) | ORAL | Status: DC
  Administered 2017-10-02 – 2017-10-05 (×6): 37.5 mg via ORAL
  Filled 2017-10-02 (×6): qty 2

## 2017-10-02 MED ORDER — GUAIFENESIN-DM 100-10 MG/5ML PO SYRP: 10.0000 mL | ORAL_SOLUTION | ORAL | Status: DC | PRN

## 2017-10-02 MED ORDER — FUROSEMIDE 40 MG PO TABS
40.0000 mg | ORAL_TABLET | Freq: Every day | ORAL | Status: DC
  Administered 2017-10-04 – 2017-10-05 (×2): 40 mg via ORAL
  Filled 2017-10-02 (×3): qty 1

## 2017-10-02 MED ORDER — METOPROLOL TARTRATE 25 MG PO TABS: 37.5000 mg | ORAL_TABLET | Freq: Two times a day (BID) | ORAL | Status: DC

## 2017-10-02 MED ORDER — ENSURE ENLIVE PO LIQD
237.0000 mL | Freq: Two times a day (BID) | ORAL | Status: DC
  Administered 2017-10-03 – 2017-10-05 (×2): 237 mL via ORAL

## 2017-10-02 MED ORDER — ONDANSETRON HCL 4 MG PO TABS: 8.0000 mg | ORAL_TABLET | Freq: Three times a day (TID) | ORAL | Status: DC | PRN

## 2017-10-02 MED ORDER — SODIUM CHLORIDE 0.9% FLUSH
10.0000 mL | INTRAVENOUS | Status: DC | PRN
  Administered 2017-10-05: 20 mL
  Filled 2017-10-02: qty 40

## 2017-10-02 MED ORDER — DILTIAZEM HCL ER COATED BEADS 240 MG PO CP24
240.0000 mg | ORAL_CAPSULE | Freq: Every day | ORAL | Status: DC
  Administered 2017-10-03 – 2017-10-05 (×3): 240 mg via ORAL
  Filled 2017-10-02 (×3): qty 1

## 2017-10-02 MED ORDER — DILTIAZEM HCL ER COATED BEADS 240 MG PO CP24: 240.0000 mg | ORAL_CAPSULE | Freq: Every day | ORAL | Status: DC

## 2017-10-02 MED ORDER — ONDANSETRON HCL 40 MG/20ML IJ SOLN
8.0000 mg | Freq: Three times a day (TID) | INTRAMUSCULAR | Status: DC | PRN
  Filled 2017-10-02: qty 4

## 2017-10-02 MED ORDER — FUROSEMIDE 40 MG PO TABS: 40.0000 mg | ORAL_TABLET | Freq: Every day | ORAL | Status: DC

## 2017-10-02 MED ORDER — SACUBITRIL-VALSARTAN 24-26 MG PO TABS: 1.0000 | ORAL_TABLET | Freq: Two times a day (BID) | ORAL | Status: DC

## 2017-10-02 MED ORDER — SACUBITRIL-VALSARTAN 24-26 MG PO TABS
1.0000 | ORAL_TABLET | Freq: Two times a day (BID) | ORAL | Status: DC
  Administered 2017-10-02 – 2017-10-05 (×6): 1 via ORAL
  Filled 2017-10-02 (×6): qty 1

## 2017-10-02 MED ORDER — ONDANSETRON 4 MG PO TBDP: 4.0000 mg | ORAL_TABLET | Freq: Three times a day (TID) | ORAL | Status: DC | PRN

## 2017-10-02 MED ORDER — VITAMIN D3 25 MCG (1000 UNIT) PO TABS
1000.0000 [IU] | ORAL_TABLET | Freq: Every day | ORAL | Status: DC
  Administered 2017-10-03 – 2017-10-05 (×3): 1000 [IU] via ORAL
  Filled 2017-10-02 (×3): qty 1

## 2017-10-02 MED ORDER — ZOLPIDEM TARTRATE 5 MG PO TABS: 5.0000 mg | ORAL_TABLET | Freq: Every evening | ORAL | Status: DC | PRN

## 2017-10-02 MED ORDER — SENNOSIDES-DOCUSATE SODIUM 8.6-50 MG PO TABS: 1.0000 | ORAL_TABLET | Freq: Every evening | ORAL | Status: DC | PRN

## 2017-10-02 MED ORDER — VITAMIN D-3 25 MCG (1000 UT) PO CAPS: 1000.0000 [IU] | ORAL_CAPSULE | Freq: Every day | ORAL | Status: DC

## 2017-10-02 NOTE — H&P (Signed)
[name] NOTE  Patient Care Team: Janith Lima, MD as PCP - General (Internal Medicine)  CHIEF COMPLAINTS/PURPOSE OF CONSULTATION:  Admission for tumor lysis monitoring on Venetoclox  HISTORY OF PRESENTING ILLNESS:  Audrey Gross 56 y.o. female is here because she is currently being treated with venetoclox and she needs to be watched in the hospital for tumor lysis.  She was taking 20 mg dosage and is supposed to start 50 mg dosage today.  Unfortunately she took the 20 mg dosage earlier in the day and hence we are not able to give her the 50 mg dosage today.  She will in fact take that 50 mg dosage tomorrow.  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  I reviewed her records extensively and collaborated the history with the patient.  SUMMARY OF ONCOLOGIC HISTORY:   CLL (chronic lymphocytic leukemia) ([place])   04/05/2015 Pathology Results    Accession: [id number] flow cytometry confirmed CLL. FISH was positive for p53 mutation      04/24/2015 Imaging    Extensive lymphadenopathy throughout the neck, chest (axilla), abdomen and pelvis, as detailed above, compatible with the reported clinical history of lymphoma. 2. Mild splenomegaly.      05/03/2015 - 08/27/2015 Chemotherapy    She started on Ibrutinib, discontinued prematurely when her prescription ran out      10/10/2015 - 05/29/2017 Chemotherapy    She was restarted back on Ibrutinib      11/13/2016 PET scan    Significant generalized reduction in size of numerous lymph nodes in the neck, chest, abdomen, and pelvis. Previously the activity of these nodes was low-level and in general a similar low-level activity is present today, significantly less than mediastinal blood pool activity, compatible with Deauville 2. 2. Coronary atherosclerosis. Mild cardiomegaly. 3. Mildly prominent endometrium for age without accentuated metabolic activity in the endometrium. Consider pelvic sonography for further  characterization.      05/27/2017 PET scan    1. Progressive hypermetabolic adenopathy, primarily involving cervical, axillary, pelvic and inguinal lymph nodes bilaterally, consistent with progressive lymphoma. 2. No solid visceral organ or osseous involvement.      06/16/2017 -  Chemotherapy    The patient had palonosetron (ALOXI) injection 0.25 mg, 0.25 mg, Intravenous,  Once, 1 of 4 cycles  riTUXimab (RITUXAN) 200 mg in sodium chloride 0.9 % 250 mL (0.7407 mg/mL) chemo infusion, 100 mg/m2 = 200 mg (100 % of original dose 100 mg/m2), Intravenous,  Once, 1 of 4 cycles Dose modification: 100 mg/m2 (original dose 100 mg/m2, Cycle 1, Reason: Provider Judgment, Comment: anticipate high risk infusion)  bendamustine (BENDEKA) 175 mg in sodium chloride 0.9 % 50 mL (3.0702 mg/mL) chemo infusion, 90 mg/m2 = 175 mg, Intravenous,  Once, 1 of 4 cycles  for chemotherapy treatment.  She received Rituximab and Bendamustine      07/15/2017 - 07/19/2017 Hospital Admission    The patient was briefly admitted to the hospital due to infusion reaction to rituximab      09/18/2017 PET scan    1. Continued considerable adenopathy in the neck, chest, abdomen, and pelvis. This is generally stable in size but mildly reduced in activity compared to the prior exam. Current levels of activity primarily Deauville 3 and Deauville 4. No splenomegaly. 2. Diffuse new ground-glass opacities in the lungs with associated hypermetabolic activity. Some forms of lymphoma infiltration can rarely cause this pattern of diffuse ground-glass opacity and hypermetabolic activity. Differential diagnostic considerations might  include atypical pneumonia such as mycoplasma, acute hypersensitivity pneumonitis, or acute eosinophilic pneumonia. Pulmonary hemorrhage seems less likely to cause this degree of accentuated metabolic activity.  3. Aortic Atherosclerosis (ICD10-I70.0). Coronary atherosclerosis.       MEDICAL HISTORY:  Past  Medical History:  Diagnosis Date  . Atrial fibrillation ([place])   . CLL (chronic lymphoblastic leukemia)    Leukemia  . Cystic breast   . Depression   . Diffuse cystic mastopathy   . Dyslipidemia (high LDL; low HDL)   . Hypertension   . Iron deficiency anemia, unspecified   . Lymphadenopathy of head and neck 04/05/2015  . Medical non-compliance   . Mitral regurgitation 02/2017   moderate to severe  . Persistent atrial fibrillation with rapid ventricular response ([place]) 07/15/2017    SURGICAL HISTORY: Past Surgical History:  Procedure Laterality Date  . CARDIOVERSION N/A 04/02/2017   Procedure: CARDIOVERSION;  Surgeon: Dorothy Spark, MD;  Location: [facility] ENDOSCOPY;  Service: Cardiovascular;  Laterality: N/A;  . CARDIOVERSION N/A 08/19/2017   Procedure: CARDIOVERSION;  Surgeon: Larey Dresser, MD;  Location: [facility] ENDOSCOPY;  Service: Cardiovascular;  Laterality: N/A;  . TUBAL LIGATION      SOCIAL HISTORY: Social History   Socioeconomic History  . Marital status: Married    Spouse name: Not on file  . Number of children: 6  . Years of education: Not on file  . Highest education level: Not on file  Social Needs  . Financial resource strain: Not on file  . Food insecurity - worry: Not on file  . Food insecurity - inability: Not on file  . Transportation needs - medical: Not on file  . Transportation needs - non-medical: Not on file  Occupational History  . Occupation: [employer] manager: [employer]  Tobacco Use  . Smoking status: Never Smoker  . Smokeless tobacco: Never Used  Substance and Sexual Activity  . Alcohol use: No  . Drug use: No  . Sexual activity: Not Currently    Partners: Male    Birth control/protection: Surgical  Other Topics Concern  . Not on file  Social History Narrative  . Not on file    FAMILY HISTORY: Family History  Problem Relation Age of Onset  . Sudden death Mother   . Kidney disease Father        H/O HD and kidney transplant  . Stomach  cancer Maternal Aunt   . Stomach cancer Paternal Grandmother   . Heart disease Brother   . Heart disease Maternal Grandmother     ALLERGIES:  has No Known Allergies.  MEDICATIONS:  Current Facility-Administered Medications  Medication Dose Route Frequency Provider Last Rate Last Dose  . 0.9 %  sodium chloride infusion   Intravenous Continuous Gardenia Phlegm, NP 10 mL/hr at 10/02/17 1447    . acetaminophen (TYLENOL) tablet 650 mg  650 mg Oral Q4H PRN Gardenia Phlegm, NP      . Derrill Memo ON 10/03/2017] acyclovir (ZOVIRAX) tablet 400 mg  400 mg Oral Daily Nicholas Lose, MD      . Derrill Memo ON 10/03/2017] allopurinol (ZYLOPRIM) tablet 300 mg  300 mg Oral Daily Nicholas Lose, MD      . alum & mag hydroxide-simeth (MAALOX/MYLANTA) 200-200-20 MG/5ML suspension 60 mL  60 mL Oral Q4H PRN Causey, Charlestine Massed, NP      . apixaban (ELIQUIS) tablet 5 mg  5 mg Oral BID Nicholas Lose, MD      . Derrill Memo ON 10/03/2017] cholecalciferol (VITAMIN  D) tablet 1,000 Units  1,000 Units Oral Q breakfast Nicholas Lose, MD      . Derrill Memo ON 10/03/2017] diltiazem (CARDIZEM CD) 24 hr capsule 240 mg  240 mg Oral Daily Nicholas Lose, MD      . feeding supplement (ENSURE ENLIVE) (ENSURE ENLIVE) liquid 237 mL  237 mL Oral BID BM Nicholas Lose, MD      . Derrill Memo ON 10/03/2017] furosemide (LASIX) tablet 40 mg  40 mg Oral Daily Nicholas Lose, MD      . guaiFENesin-dextromethorphan (ROBITUSSIN DM) 100-10 MG/5ML syrup 10 mL  10 mL Oral Q4H PRN Causey, Charlestine Massed, NP      . hydrocortisone (ANUSOL-HC) 2.5 % rectal cream 1 application  1 application Rectal BID PRN Gardenia Phlegm, NP      . metoprolol tartrate (LOPRESSOR) tablet 37.5 mg  37.5 mg Oral BID Nicholas Lose, MD      . ondansetron (ZOFRAN) tablet 4-8 mg  4-8 mg Oral Q8H PRN Causey, Charlestine Massed, NP       Or  . ondansetron (ZOFRAN-ODT) disintegrating tablet 4-8 mg  4-8 mg Oral Q8H PRN Causey, Charlestine Massed, NP       Or  . ondansetron  (ZOFRAN) injection 4 mg  4 mg Intravenous Q8H PRN Causey, Charlestine Massed, NP       Or  . ondansetron (ZOFRAN) 8 mg in sodium chloride 0.9 % 50 mL IVPB  8 mg Intravenous Q8H PRN Gardenia Phlegm, NP      . Derrill Memo ON 10/03/2017] predniSONE (DELTASONE) tablet 10 mg  10 mg Oral Q breakfast Nicholas Lose, MD      . sacubitril-valsartan (ENTRESTO) 24-26 mg per tablet  1 tablet Oral BID Nicholas Lose, MD      . senna-docusate (Senokot-S) tablet 1 tablet  1 tablet Oral QHS PRN Causey, Charlestine Massed, NP      . sodium chloride flush (NS) 0.9 % injection 10-40 mL  10-40 mL Intracatheter PRN Nicholas Lose, MD      . Derrill Memo ON 10/03/2017] Venetoclax TABS 50 mg  50 mg Oral Q breakfast Nicholas Lose, MD      . zolpidem (AMBIEN) tablet 5 mg  5 mg Oral QHS PRN Causey, Charlestine Massed, NP        REVIEW OF SYSTEMS:   Constitutional: Denies fevers, chills or abnormal night sweats Eyes: Denies blurriness of vision, double vision or watery eyes Ears, nose, mouth, throat, and face: Denies mucositis or sore throat Respiratory: Shortness of breath with exertion Cardiovascular: Atrial fibrillation Gastrointestinal:  Denies nausea, heartburn or change in bowel habits Skin: Denies abnormal skin rashes Lymphatics: Denies new lymphadenopathy or easy bruising Neurological:Denies numbness, tingling or new weaknesses Behavioral/Psych: Mood is stable, no new changes   All other systems were reviewed with the patient and are negative.  PHYSICAL EXAMINATION: ECOG PERFORMANCE STATUS: 1 - Symptomatic but completely ambulatory  Vitals:   10/02/17 1305  BP: 126/87  Pulse: 96  Resp: (!) 24  Temp: 98.6 F (37 C)  SpO2: 96%   Filed Weights   10/02/17 1305  Weight: 180 lb 3.2 oz (81.7 kg)    GENERAL:alert, no distress and comfortable SKIN: skin color, texture, turgor are normal, no rashes or significant lesions EYES: normal, conjunctiva are pink and non-injected, sclera clear OROPHARYNX:no exudate, no  erythema and lips, buccal mucosa, and tongue normal  NECK: supple, thyroid normal size, non-tender, without nodularity LYMPH:  no palpable lymphadenopathy in the cervical, axillary or inguinal LUNGS: Fine crackles at  the bases HEART: Irregular ABDOMEN:abdomen soft, non-tender and normal bowel sounds Musculoskeletal:no cyanosis of digits and no clubbing  PSYCH: alert & oriented x 3 with fluent speech NEURO: no focal motor/sensory deficits   LABORATORY DATA:  I have reviewed the data as listed Lab Results  Component Value Date   WBC 158.3 (HH) 10/02/2017   HGB 10.5 (L) 10/02/2017   HCT 33.8 (L) 10/02/2017   MCV 87.6 10/02/2017   PLT 302 10/02/2017   Lab Results  Component Value Date   NA 139 10/02/2017   K 3.8 10/02/2017   CL 110 10/02/2017   CO2 23 10/02/2017    RADIOGRAPHIC STUDIES: I have personally reviewed the radiological reports and agreed with the findings in the report.  ASSESSMENT AND PLAN:  1. 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  We will have to keep her overnight for tumor lysis evaluation. 2. History of congestive heart failure and atrial fibrillation: Very careful IV hydration will need to be given.  Chest x-ray will be obtained as well.  On Eliquis 3. Tumor lysis precautions: Currently on allopurinol.   All questions were answered. The patient knows to call the clinic with any problems, questions or concerns.    Harriette Ohara, MD _0 @

## 2017-10-03 DIAGNOSIS — D630 Anemia in neoplastic disease: Secondary | ICD-10-CM

## 2017-10-03 DIAGNOSIS — F419 Anxiety disorder, unspecified: Secondary | ICD-10-CM

## 2017-10-03 DIAGNOSIS — R0602 Shortness of breath: Secondary | ICD-10-CM

## 2017-10-03 DIAGNOSIS — G4700 Insomnia, unspecified: Secondary | ICD-10-CM

## 2017-10-03 LAB — PHOSPHORUS
PHOSPHORUS: 3 mg/dL (ref 2.5–4.6)
PHOSPHORUS: 2.6 mg/dL (ref 2.5–4.6)
PHOSPHORUS: 3.4 mg/dL (ref 2.5–4.6)
Phosphorus: 3.7 mg/dL (ref 2.5–4.6)

## 2017-10-03 LAB — CBC WITH DIFFERENTIAL/PLATELET
BAND NEUTROPHILS: 0 %
BASOS ABS: 0 10*3/uL (ref 0.0–0.1)
BASOS PCT: 0 %
Blasts: 0 %
EOS ABS: 0 10*3/uL (ref 0.0–0.7)
Eosinophils Relative: 0 %
HEMATOCRIT: 30.7 % — AB (ref 36.0–46.0)
HEMOGLOBIN: 9.6 g/dL — AB (ref 12.0–15.0)
Lymphocytes Relative: 90 %
Lymphs Abs: 135.2 10*3/uL — ABNORMAL HIGH (ref 0.7–4.0)
MCH: 27.5 pg (ref 26.0–34.0)
MCHC: 31.3 g/dL (ref 30.0–36.0)
MCV: 88 fL (ref 78.0–100.0)
MONO ABS: 6 10*3/uL — AB (ref 0.1–1.0)
MYELOCYTES: 0 %
Metamyelocytes Relative: 0 %
Monocytes Relative: 4 %
Neutro Abs: 9 10*3/uL — ABNORMAL HIGH (ref 1.7–7.7)
Neutrophils Relative %: 6 %
PROMYELOCYTES ABS: 0 %
Platelets: 295 10*3/uL (ref 150–400)
RBC: 3.49 MIL/uL — ABNORMAL LOW (ref 3.87–5.11)
RDW: 17.4 % — AB (ref 11.5–15.5)
WBC: 150.2 10*3/uL (ref 4.0–10.5)
nRBC: 0 /100 WBC

## 2017-10-03 LAB — COMPREHENSIVE METABOLIC PANEL
ALBUMIN: 3.1 g/dL — AB (ref 3.5–5.0)
ALBUMIN: 3 g/dL — AB (ref 3.5–5.0)
ALK PHOS: 55 U/L (ref 38–126)
ALK PHOS: 60 U/L (ref 38–126)
ALK PHOS: 59 U/L (ref 38–126)
ALT: 19 U/L (ref 14–54)
ALT: 18 U/L (ref 14–54)
ALT: 19 U/L (ref 14–54)
ALT: 18 U/L (ref 14–54)
ANION GAP: 8 (ref 5–15)
AST: 31 U/L (ref 15–41)
AST: 29 U/L (ref 15–41)
AST: 26 U/L (ref 15–41)
AST: 24 U/L (ref 15–41)
Albumin: 3 g/dL — ABNORMAL LOW (ref 3.5–5.0)
Albumin: 3.1 g/dL — ABNORMAL LOW (ref 3.5–5.0)
Alkaline Phosphatase: 56 U/L (ref 38–126)
Anion gap: 8 (ref 5–15)
Anion gap: 9 (ref 5–15)
Anion gap: 7 (ref 5–15)
BILIRUBIN TOTAL: 0.4 mg/dL (ref 0.3–1.2)
BILIRUBIN TOTAL: 0.9 mg/dL (ref 0.3–1.2)
BILIRUBIN TOTAL: 1.2 mg/dL (ref 0.3–1.2)
BUN: 14 mg/dL (ref 6–20)
BUN: 14 mg/dL (ref 6–20)
BUN: 16 mg/dL (ref 6–20)
BUN: 15 mg/dL (ref 6–20)
CALCIUM: 9.2 mg/dL (ref 8.9–10.3)
CALCIUM: 9.2 mg/dL (ref 8.9–10.3)
CALCIUM: 9.2 mg/dL (ref 8.9–10.3)
CHLORIDE: 107 mmol/L (ref 101–111)
CHLORIDE: 106 mmol/L (ref 101–111)
CO2: 23 mmol/L (ref 22–32)
CO2: 22 mmol/L (ref 22–32)
CO2: 25 mmol/L (ref 22–32)
CO2: 24 mmol/L (ref 22–32)
CREATININE: 0.69 mg/dL (ref 0.44–1.00)
CREATININE: 0.65 mg/dL (ref 0.44–1.00)
Calcium: 9.5 mg/dL (ref 8.9–10.3)
Chloride: 108 mmol/L (ref 101–111)
Chloride: 107 mmol/L (ref 101–111)
Creatinine, Ser: 0.75 mg/dL (ref 0.44–1.00)
Creatinine, Ser: 0.69 mg/dL (ref 0.44–1.00)
GFR calc Af Amer: >60 mL/min (ref >60)
GFR calc Af Amer: >60 mL/min (ref >60)
GFR calc non Af Amer: >60 mL/min (ref >60)
GFR calc non Af Amer: >60 mL/min (ref >60)
GLUCOSE: 133 mg/dL — AB (ref 65–99)
GLUCOSE: 134 mg/dL — AB (ref 65–99)
Glucose, Bld: 99 mg/dL (ref 65–99)
Glucose, Bld: 115 mg/dL — ABNORMAL HIGH (ref 65–99)
POTASSIUM: 3.8 mmol/L (ref 3.5–5.1)
POTASSIUM: 4.2 mmol/L (ref 3.5–5.1)
Potassium: 3.7 mmol/L (ref 3.5–5.1)
Potassium: 4.4 mmol/L (ref 3.5–5.1)
SODIUM: 138 mmol/L (ref 135–145)
SODIUM: 138 mmol/L (ref 135–145)
Sodium: 139 mmol/L (ref 135–145)
Sodium: 139 mmol/L (ref 135–145)
TOTAL PROTEIN: 5.7 g/dL — AB (ref 6.5–8.1)
TOTAL PROTEIN: 6 g/dL — AB (ref 6.5–8.1)
TOTAL PROTEIN: 5.9 g/dL — AB (ref 6.5–8.1)
Total Bilirubin: 1 mg/dL (ref 0.3–1.2)
Total Protein: 5.7 g/dL — ABNORMAL LOW (ref 6.5–8.1)

## 2017-10-03 LAB — URIC ACID
URIC ACID, SERUM: 3.1 mg/dL (ref 2.3–6.6)
URIC ACID, SERUM: 2.9 mg/dL (ref 2.3–6.6)
Uric Acid, Serum: 3.2 mg/dL (ref 2.3–6.6)
Uric Acid, Serum: 3.3 mg/dL (ref 2.3–6.6)

## 2017-10-03 MED ORDER — FUROSEMIDE 10 MG/ML IJ SOLN
20.0000 mg | Freq: Once | INTRAMUSCULAR | Status: AC
  Administered 2017-10-03: 20 mg via INTRAVENOUS
  Filled 2017-10-03: qty 2

## 2017-10-03 NOTE — Progress Notes (Signed)
HEMATOLOGY-ONCOLOGY PROGRESS NOTE  SUBJECTIVE: Worsening SOB, Couldn't sleep last night.Anxious  OBJECTIVE: REVIEW OF SYSTEMS:   Constitutional: Denies fevers, chills or abnormal weight loss Eyes: Denies blurriness of vision Ears, nose, mouth, throat, and face: Denies mucositis or sore throat Respiratory: Worsening SOB Cardiovascular: Denies palpitation, chest discomfort Gastrointestinal:  Denies nausea, heartburn or change in bowel habits Skin: Denies abnormal skin rashes Lymphatics: Denies new lymphadenopathy or easy bruising Neurological:Denies numbness, tingling or new weaknesses Behavioral/Psych: Mood is stable, no new changes  Extremities: No lower extremity edema All other systems were reviewed with the patient and are negative.  I have reviewed the past medical history, past surgical history, social history and family history with the patient and they are unchanged from previous note.   PHYSICAL EXAMINATION: ECOG PERFORMANCE STATUS: 2 - Symptomatic, <50% confined to bed  Vitals:   10/02/17 2132 10/03/17 0651  BP: 120/87 123/67  Pulse: 87 76  Resp: 20 20  Temp: 98.7 F (37.1 C) 98.7 F (37.1 C)  SpO2: 98% 97%   Filed Weights   10/02/17 1305 10/02/17 2132 10/03/17 0651  Weight: 180 lb 3.2 oz (81.7 kg) 180 lb 4.8 oz (81.8 kg) 180 lb 4.8 oz (81.8 kg)    GENERAL:alert, no distress and comfortable SKIN: skin color, texture, turgor are normal, no rashes or significant lesions EYES: normal, Conjunctiva are pink and non-injected, sclera clear OROPHARYNX:no exudate, no erythema and lips, buccal mucosa, and tongue normal  NECK: supple, thyroid normal size, non-tender, without nodularity LYMPH:  no palpable lymphadenopathy in the cervical, axillary or inguinal LUNGS: Bilateral crackles at bases HEART: Irregular, elevated JVD ABDOMEN:abdomen soft, non-tender and normal bowel sounds Musculoskeletal:no cyanosis of digits and no clubbing  NEURO: alert & oriented x 3 with  fluent speech, no focal motor/sensory deficits  LABORATORY DATA:  I have reviewed the data as listed CMP Latest Ref Rng & Units 10/03/2017 10/02/2017 09/28/2017  Glucose 65 - 99 mg/dL 99 104(H) 88  BUN 6 - 20 mg/dL 14 11 19   Creatinine 0.44 - 1.00 mg/dL 0.69 0.67 0.80  Sodium 135 - 145 mmol/L 139 139 139  Potassium 3.5 - 5.1 mmol/L 3.7 3.8 4.0  Chloride 101 - 111 mmol/L 108 110 108  CO2 22 - 32 mmol/L 23 23 23   Calcium 8.9 - 10.3 mg/dL 9.2 9.4 8.8(L)  Total Protein 6.5 - 8.1 g/dL 5.7(L) 6.4(L) 5.7(L)  Total Bilirubin 0.3 - 1.2 mg/dL 0.4 1.2 0.9  Alkaline Phos 38 - 126 U/L 55 62 45  AST 15 - 41 U/L 31 31 33  ALT 14 - 54 U/L 19 18 19     Lab Results  Component Value Date   WBC 150.2 (HH) 10/03/2017   HGB 9.6 (L) 10/03/2017   HCT 30.7 (L) 10/03/2017   MCV 88.0 10/03/2017   PLT 295 10/03/2017   NEUTROABS 9.0 (H) 10/03/2017    ASSESSMENT AND PLAN: 1. CLL/SLL 17P deletion: Today she received 50 mg venetoclax. Monitoring for tumor lysis; ALC 135K (inc from yday) 2. CHF: CXR showed increased edema, also has elevated JVD. Will give 20 mg IV Lasix X 1 today; on Entresto 3. 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 Watching daily electrolytes and Uric acid. 5. Insomnia: Patient has Ambien PRN. Instructed to ask for it at night. 6. A.Fib: On Eliquis, metoprolol Will follow her closely to review her labs and her clinical condition

## 2017-10-04 LAB — CBC WITH DIFFERENTIAL/PLATELET
BAND NEUTROPHILS: 0 %
BASOS ABS: 0 10*3/uL (ref 0.0–0.1)
BLASTS: 0 %
Basophils Relative: 0 %
EOS ABS: 0 10*3/uL (ref 0.0–0.7)
Eosinophils Relative: 0 %
HEMATOCRIT: 30.9 % — AB (ref 36.0–46.0)
HEMOGLOBIN: 9.6 g/dL — AB (ref 12.0–15.0)
LYMPHS PCT: 92 %
Lymphs Abs: 120.3 10*3/uL — ABNORMAL HIGH (ref 0.7–4.0)
MCH: 27.4 pg (ref 26.0–34.0)
MCHC: 31.1 g/dL (ref 30.0–36.0)
MCV: 88.3 fL (ref 78.0–100.0)
METAMYELOCYTES PCT: 0 %
MONOS PCT: 0 %
Monocytes Absolute: 0 10*3/uL — ABNORMAL LOW (ref 0.1–1.0)
Myelocytes: 0 %
Neutro Abs: 10.5 10*3/uL — ABNORMAL HIGH (ref 1.7–7.7)
Neutrophils Relative %: 8 %
OTHER: 0 %
PROMYELOCYTES ABS: 0 %
Platelets: 285 10*3/uL (ref 150–400)
RBC: 3.5 MIL/uL — AB (ref 3.87–5.11)
RDW: 17.3 % — ABNORMAL HIGH (ref 11.5–15.5)
WBC: 130.8 10*3/uL — AB (ref 4.0–10.5)
nRBC: 0 /100 WBC

## 2017-10-04 LAB — COMPREHENSIVE METABOLIC PANEL
ALT: 18 U/L (ref 14–54)
ANION GAP: 7 (ref 5–15)
AST: 26 U/L (ref 15–41)
Albumin: 2.8 g/dL — ABNORMAL LOW (ref 3.5–5.0)
Alkaline Phosphatase: 53 U/L (ref 38–126)
BUN: 15 mg/dL (ref 6–20)
CHLORIDE: 107 mmol/L (ref 101–111)
CO2: 26 mmol/L (ref 22–32)
CREATININE: 0.66 mg/dL (ref 0.44–1.00)
Calcium: 9.2 mg/dL (ref 8.9–10.3)
Glucose, Bld: 98 mg/dL (ref 65–99)
Potassium: 3.9 mmol/L (ref 3.5–5.1)
SODIUM: 140 mmol/L (ref 135–145)
Total Bilirubin: 0.9 mg/dL (ref 0.3–1.2)
Total Protein: 5.5 g/dL — ABNORMAL LOW (ref 6.5–8.1)

## 2017-10-04 LAB — PHOSPHORUS: PHOSPHORUS: 3.8 mg/dL (ref 2.5–4.6)

## 2017-10-04 LAB — URIC ACID: URIC ACID, SERUM: 3.2 mg/dL (ref 2.3–6.6)

## 2017-10-04 NOTE — Discharge Summary (Signed)
Physician Discharge Summary  Patient ID: DANILA EDDIE MRN: [id number] DOB/AGE: April 09, 1961 56 y.o.  Admit date: 10/02/2017 Discharge date: 10/04/2017  Primary Care Physician:  Janith Lima, MD   Discharge Diagnoses:   CLL/SLL CHF Atrial fibrillation Depression Hypertension Hyperlipidemia Iron deficiency anemia  Present on Admission: . CLL (chronic lymphocytic leukemia) ([place])   Discharge Medications:  Allergies as of 10/04/2017   No Known Allergies     Medication List    TAKE these medications   acyclovir 400 MG tablet Commonly known as:  ZOVIRAX TAKE 1 TABLET BY MOUTH EVERY DAY What changed:    how much to take  how to take this  when to take this   allopurinol 300 MG tablet Commonly known as:  ZYLOPRIM Take 1 tablet (300 mg total) by mouth daily.   diltiazem 2 % Gel Apply 1 application topically 3 (three) times daily. Use for 6 weeks What changed:    when to take this  additional instructions   diltiazem 240 MG 24 hr capsule Commonly known as:  CARDIZEM CD Take 1 capsule (240 mg total) daily by mouth.   ELIQUIS 5 MG Tabs tablet Generic drug:  apixaban TAKE 1 TABLET BY MOUTH TWICE A DAY What changed:    how much to take  how to take this  when to take this   feeding supplement (ENSURE ENLIVE) Liqd Take 237 mLs by mouth 2 (two) times daily between meals.   furosemide 40 MG tablet Commonly known as:  LASIX TAKE 1 TABLET BY MOUTH EVERY DAY What changed:    how much to take  how to take this  when to take this   metoprolol tartrate 25 MG tablet Commonly known as:  LOPRESSOR Take 1.5 tablets (37.5 mg total) by mouth 2 (two) times daily.   ondansetron 8 MG tablet Commonly known as:  ZOFRAN Take 1 tablet (8 mg total) by mouth every 8 (eight) hours as needed for refractory nausea / vomiting.   predniSONE 10 MG tablet Commonly known as:  DELTASONE TAKE 1 TABLET BY MOUTH DAILY WITH BREAKFAST What changed:    how much  to take  how to take this  when to take this   prochlorperazine 10 MG tablet Commonly known as:  COMPAZINE Take 10 mg by mouth every 6 (six) hours as needed for nausea or vomiting.   sacubitril-valsartan 24-26 MG Commonly known as:  ENTRESTO Take 1 tablet by mouth 2 (two) times daily.   spironolactone 25 MG tablet Commonly known as:  ALDACTONE Take 25 mg by mouth daily.   Venetoclax 10 MG Tabs Commonly known as:  VENCLEXTA Take 20 mg by mouth daily with breakfast.   Vitamin D-3 1000 units Caps Take 1,000 Units by mouth daily with breakfast.        Disposition and Follow-up:   Significant Diagnostic Studies:  Dg Chest 2 View  Result Date: 10/02/2017 CLINICAL DATA:  Sob; f/u CHF. History of CLL and atrial fibrillation. EXAM: CHEST  2 VIEW COMPARISON:  09/22/2017 FINDINGS: Heart is enlarged. Patient has new right sided PICC line, tip overlying the level of superior vena cava. There are patchy opacities within the lungs bilaterally, increased over prior study and compatible with increased edema. Infectious infiltrate could have a similar appearance. IMPRESSION: Increased bilateral airspace filling opacities, likely representing increased edema. Electronically Signed   By: Nolon Nations M.D.   On: 10/02/2017 14:35   Dg Chest 2 View  Result Date: 09/22/2017 CLINICAL DATA:  Sob. Pt has been having sob for 3 days w/ a dry cough. She said sometimes she'll cough until she feels like throwing up, but doesn't. Pt stated it just feels like her sob and cough are getting worse. Pt displayed heavy breathing after talking for hx and following respirations for exam. Hx of A-fib, cystic breast, diffuse cystic mastopathy, HTN, mitral regurgitation, persistent A-fib w/ rapid ventricular response EXAM: CHEST  2 VIEW COMPARISON:  Chest CT, 09/22/2017.  Chest radiograph, 07/15/2017. FINDINGS: Mild enlargement of the cardiac silhouette, stable. Mild thickening along the right peritracheal stripe.  Mild prominence of the hila. This reflects the adenopathy better seen on the current CT. Subtle patchy hazy opacities are noted in the lungs, most evident in the left mid to upper lung on the frontal view. This was also better appreciated on the current CT. The ground-glass opacity noted in the lower lungs on the current CT is not well appreciated radiographically. No new lung abnormalities. No pleural effusion. No pneumothorax. Skeletal structures are unremarkable. Subtle soft tissue masses are evident along the axilla consistent with the adenopathy noted on the current CT. IMPRESSION: 1. Lung opacities noted on the current CT are not as well-defined radiographically, only subtly evident. No new lung abnormalities. 2. Mediastinal and hilar adenopathy as well as axillary adenopathy is also less well-defined than it was on the current CT. Electronically Signed   By: Lajean Manes M.D.   On: 09/22/2017 17:40   Ct Chest W Contrast  Result Date: 09/22/2017 CLINICAL DATA:  56 year old female with increasing shortness of breath for the past 3 days. History of chronic cough. Evaluate for pneumonia. History of chronic lymphocytic leukemia (CLL) and atrial fibrillation. EXAM: CT CHEST WITH CONTRAST TECHNIQUE: Multidetector CT imaging of the chest was performed during intravenous contrast administration. CONTRAST:  58mL ISOVUE-300 IOPAMIDOL (ISOVUE-300) INJECTION 61% COMPARISON:  PET-CT 09/18/2017. FINDINGS: Cardiovascular: Heart size is borderline enlarged. There is no significant pericardial fluid, thickening or pericardial calcification. There is aortic atherosclerosis, as well as atherosclerosis of the great vessels of the mediastinum and the coronary arteries, including calcified atherosclerotic plaque in the left anterior descending, left circumflex and right coronary arteries. Dilatation of the pulmonic trunk (4.1 cm in diameter), concerning for pulmonary arterial hypertension. Mediastinum/Nodes: Extensive  mediastinal and bilateral hilar lymphadenopathy, similar to recent PET-CT, with the largest lymph nodes measuring up to 18 mm in short axis in the right hilar region. Esophagus is unremarkable in appearance. Numerous enlarged axillary lymph nodes bilaterally measuring up to 17 mm in short axis on the left and 18 mm in short axis on the right. Lungs/Pleura: As with the recent PET-CT there is extensive patchy ground-glass attenuation scattered throughout the lungs bilaterally, the severity of which is very similar to the prior study. A few scattered areas of more dense peribronchovascular airspace consolidation are also noted, slightly increased compared to the recent examination. No pleural effusions. No definite discrete suspicious pulmonary nodules or masses. Upper Abdomen: Spleen is incompletely visualized but appears likely enlarged measuring up to of 4.0 x 12.0 cm Musculoskeletal: There are no aggressive appearing lytic or blastic lesions noted in the visualized portions of the skeleton. IMPRESSION: 1. Unusual appearance of the lung parenchyma with progressive ground-glass attenuation and developing areas of patchy peribronchovascular consolidation throughout the lungs bilaterally. 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 Alternatively, findings may simply reflect evolving atypical infection in the setting of underlying CLL, or are less likely related to direct pulmonic leukemic  infiltration. 2. Widespread mediastinal, bilateral hilar and bilateral axillary lymphadenopathy related to underlying CLL. Probable splenomegaly also noted. 3. Aortic atherosclerosis, in addition to three-vessel coronary artery disease. Please note that although the presence of coronary artery calcium documents the presence of coronary artery disease, the severity of this disease and any potential stenosis cannot be assessed on this non-gated CT examination. Assessment for  potential risk factor modification, dietary therapy or pharmacologic therapy may be warranted, if clinically indicated. 4. Dilatation of the pulmonic trunk (4.1 cm in diameter), concerning for pulmonary arterial hypertension. Aortic Atherosclerosis (ICD10-I70.0). Electronically Signed   By: Vinnie Langton M.D.   On: 09/22/2017 12:02   Nm Pet Image Restag (ps) Skull Base To Thigh  Result Date: 09/18/2017 CLINICAL DATA:  Subsequent treatment strategy for chronic lymphocytic leukemia. EXAM: NUCLEAR MEDICINE PET SKULL BASE TO THIGH TECHNIQUE: 9.3 mCi F-18 FDG was injected intravenously. Full-ring PET imaging was performed from the skull base to thigh after the radiotracer. CT data was obtained and used for attenuation correction and anatomic localization. FASTING BLOOD GLUCOSE:  Value: 97 mg/dl COMPARISON:  Multiple exams, including 05/27/2017 FINDINGS: NECK Bilateral level IIa, level II B, level V, level III, and level IV lymph nodes are present as on the prior exam. An index right level II B lymph node measures 1.3 cm in short axis on image 33/4 (formerly 1.4 cm) and has a maximum SUV of 3.3 (formerly 4.7). CHEST Bilateral hypermetabolic axillary, paratracheal, AP window, supraclavicular, hilar, and infrahilar adenopathy observed. Index left axillary node 1.9 cm in short axis on image 54/4 with maximum SUV 3.0 (formerly 1.8 cm by my measurement with maximum SUV 3.9). There are patchy bilateral ground-glass opacities throughout the lungs with associated accentuated metabolic activity, new from the prior exam. Accentuated activity in the right posterior hemithorax with maximum SUV of 5.8. Some of the areas are of hypermetabolic activity are more confluent and in the right upper lobe an area of confluent metabolic activity has maximum SUV of 8.9 but with only indistinct ground-glass opacity on the lung window images. Thyroid goiter without hypermetabolic activity. Coronary artery atherosclerosis with mild  cardiomegaly. Background blood pool activity 2.3 ABDOMEN/PELVIS Pathologic right gastric, peripancreatic, central mesenteric, retroperitoneal/periaortic, external iliac, common iliac, inguinal, and obturator adenopathy noted. A representative index right obturator lymph node measures 1.5 cm in short axis on image 166/4 and has maximum SUV of 5.0 (formerly 1.5 cm in diameter with maximum SUV 4.3). The left obturator node measures 1.6 cm in short axis on image 167/4 (formerly 1.8 cm) and has maximum SUV of 3.5 (formerly 4.5). The spleen measures 10.3 by 4.6 by 11.7 cm (volume = 290 cm^3). No focal splenic abnormal activity identified. Aortoiliac atherosclerotic vascular disease. Small indirect inguinal hernias contain adipose tissue. Background hepatic activity 4.1. SKELETON No focal hypermetabolic activity to suggest skeletal metastasis. IMPRESSION: 1. Continued considerable adenopathy in the neck, chest, abdomen, and pelvis. This is generally stable in size but mildly reduced in activity compared to the prior exam. Current levels of activity primarily Deauville 3 and Deauville 4. No splenomegaly. 2. Diffuse new ground-glass opacities in the lungs with associated hypermetabolic activity. Some forms of lymphoma infiltration can rarely cause this pattern of diffuse ground-glass opacity and hypermetabolic activity. Differential diagnostic considerations might include atypical pneumonia such as mycoplasma, acute hypersensitivity pneumonitis, or acute eosinophilic pneumonia. Pulmonary hemorrhage seems less likely to cause this degree of accentuated metabolic activity. 3.  Aortic Atherosclerosis (ICD10-I70.0).  Coronary atherosclerosis. Electronically Signed   By: Thayer Jew  Janeece Fitting M.D.   On: 09/18/2017 10:05    Discharge Laboratory Values: CMP Latest Ref Rng & Units 10/04/2017 10/03/2017 10/03/2017  Glucose 65 - 99 mg/dL 98 134(H) 115(H)  BUN 6 - 20 mg/dL 15 15 16   Creatinine 0.44 - 1.00 mg/dL 0.66 0.69 0.65   Sodium 135 - 145 mmol/L 140 138 139  Potassium 3.5 - 5.1 mmol/L 3.9 4.2 4.4  Chloride 101 - 111 mmol/L 107 106 107  CO2 22 - 32 mmol/L 26 24 25   Calcium 8.9 - 10.3 mg/dL 9.2 9.2 9.2  Total Protein 6.5 - 8.1 g/dL 5.5(L) 5.7(L) 5.9(L)  Total Bilirubin 0.3 - 1.2 mg/dL 0.9 1.0 1.2  Alkaline Phos 38 - 126 U/L 53 56 59  AST 15 - 41 U/L 26 24 26   ALT 14 - 54 U/L 18 18 19    CBC Latest Ref Rng & Units 10/04/2017 10/03/2017 10/02/2017  WBC 4.0 - 10.5 K/uL 130.8(HH) 150.2(HH) 158.3(HH)  Hemoglobin 12.0 - 15.0 g/dL 9.6(L) 9.6(L) 10.5(L)  Hematocrit 36.0 - 46.0 % 30.9(L) 30.7(L) 33.8(L)  Platelets 150 - 400 K/uL 285 295 302   At the time of discharge Uric acid 3.2 Phosphorus 3.8  Brief H and P: For complete details please refer to admission H and P, but in brief, Mrs. 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  On the day of admission patient had already taken the 20 mg dose and therefore the 50 mg dose was given on 10/03/2017.  She was observed very closely with blood work every 8 hours.  She did not experience any major side effects from the dose escalation.  She did not have any tumor lysis.  At the time of discharge her labs were reviewed and she had normal potassium and normal phosphorus as well as normal uric acid.  The WBC count decreased from 150-130.  She was instructed to continue with the 50 mg dosage of Venetoclax with the plan to dose escalate next week.  Dr. Alvy Bimler we will plan her next admission. For her congestive heart failure, we will give a dose of IV Lasix because a chest x-ray performed in the hospital suggested increased pulmonary edema.  We did not want to overdo the Lasix primarily because we are afraid of renal failure especially in the setting of potential tumor lysis syndrome.  Possible find a nurse   Physical Exam at Discharge: BP 113/77 (BP Location: Left Arm)   Pulse 81   Temp 98.4 F (36.9 C) (Oral)    Resp 18   Ht 5\' 7"  (1.702 m)   Wt 180 lb 12.4 oz (82 kg)   SpO2 96%   BMI 28.31 kg/m  Gen: Short of breath to minimal exertion Cardiovascular: Atrial fibrillation irregular Respiratory: Basilar crackles Gastrointestinal: No hepatosplenomegaly Extremities: No edema Neuro: Intact   Hospital Course:  Active Problems:   CLL (chronic lymphocytic leukemia) ([facility])   Diet: Cardiac diet  Activity: As tolerated  Condition at Discharge:   Stable  Signed Harriette Ohara, MD  10/04/2017, 10:07 AM

## 2017-10-04 NOTE — Discharge Instructions (Signed)
Plan to get admitted again 10/09/17

## 2017-10-04 NOTE — Progress Notes (Signed)
Initial Nutrition Assessment  DOCUMENTATION CODES:   Not applicable  INTERVENTION:   Continue Ensure Enlive po BID, each supplement provides 350 kcal and 20 grams of protein  NUTRITION DIAGNOSIS:   Increased nutrient needs related to cancer and cancer related treatments as evidenced by estimated needs.  GOAL:   Patient will meet greater than or equal to 90% of their needs  MONITOR:   PO intake, Supplement acceptance, Weight trends, I & O's  REASON FOR ASSESSMENT:   Malnutrition Screening Tool    ASSESSMENT:   Pt with PMH of refractory CLL, A.Fib, sCHF, and HTN presents with CLL, pt was being monitored for tumor lysis with escalated dosage of Venetoclax   Chest X-ray suggested increased pulmonary edema, pt given IV Lasix.  Spoke with pt at bedside. Pt reports her appetite is fair but intake has been impacted slightly because her medicine leaves a "bad" taste in her mouth.  Pt reports no N/V currently however reports diarrhea.  Pt SOB at visit, and states she occasionally gets SOB while consuming meals. Pt reports trying to eat small frequent meals at home, however the past couple weeks have "been a struggle" Per chart, pt consuming between 90-100% of meals at this time. Per weight readings, pt's weight seems to be declining over the past couple months.  Pt enjoys Ensure and is willing to continue supplementation.  RD encouraged adequate calorie and protein consumption.   Labs reviewed; Albumin 2.8, Hemoglobin 9.6 Medications reviewed; vitamin D, Lasix, Prednisone, Eliquis, Venetoclax  NUTRITION - FOCUSED PHYSICAL EXAM:    Most Recent Value  Orbital Region  No depletion  Upper Arm Region  No depletion  Thoracic and Lumbar Region  No depletion  Buccal Region  No depletion  Temple Region  No depletion  Clavicle Bone Region  No depletion  Clavicle and Acromion Bone Region  No depletion  Scapular Bone Region  No depletion  Dorsal Hand  No depletion  Patellar Region   No depletion  Anterior Thigh Region  No depletion  Posterior Calf Region  No depletion  Edema (RD Assessment)  None  Hair  Reviewed  Eyes  Reviewed  Mouth  Reviewed  Skin  Reviewed  Nails  Reviewed       Diet Order:  Diet regular Room service appropriate? Yes; Fluid consistency: Thin  EDUCATION NEEDS:   Not appropriate for education at this time  Skin:  Skin Assessment: Reviewed RN Assessment  Last BM:  10/03/17  Height:   Ht Readings from Last 1 Encounters:  10/02/17 5\' 7"  (1.702 m)    Weight:   Wt Readings from Last 1 Encounters:  10/04/17 180 lb 12.4 oz (82 kg)    Ideal Body Weight:  61.4 kg  BMI:  Body mass index is 28.31 kg/m.  Estimated Nutritional Needs:   Kcal:  1700-1900  Protein:  80-90 grams  Fluid:  >/= 1.7 L/d  Parks Ranger, MS, RDN, LDN 10/04/2017 12:50 PM

## 2017-10-05 LAB — CBC WITH DIFFERENTIAL/PLATELET
BASOS PCT: 0 %
Basophils Absolute: 0 10*3/uL (ref 0.0–0.1)
EOS PCT: 0 %
Eosinophils Absolute: 0 10*3/uL (ref 0.0–0.7)
HCT: 29.5 % — ABNORMAL LOW (ref 36.0–46.0)
Hemoglobin: 9.2 g/dL — ABNORMAL LOW (ref 12.0–15.0)
LYMPHS ABS: 103.4 10*3/uL — AB (ref 0.7–4.0)
Lymphocytes Relative: 84 %
MCH: 27.5 pg (ref 26.0–34.0)
MCHC: 31.2 g/dL (ref 30.0–36.0)
MCV: 88.3 fL (ref 78.0–100.0)
MONO ABS: 4.9 10*3/uL — AB (ref 0.1–1.0)
Monocytes Relative: 4 %
Neutro Abs: 14.8 10*3/uL — ABNORMAL HIGH (ref 1.7–7.7)
Neutrophils Relative %: 12 %
PLATELETS: 274 10*3/uL (ref 150–400)
RBC: 3.34 MIL/uL — ABNORMAL LOW (ref 3.87–5.11)
RDW: 17.1 % — AB (ref 11.5–15.5)
WBC: 123.1 10*3/uL (ref 4.0–10.5)

## 2017-10-05 MED ORDER — HEPARIN SOD (PORK) LOCK FLUSH 100 UNIT/ML IV SOLN
250.0000 [IU] | INTRAVENOUS | Status: DC | PRN
  Administered 2017-10-05: 500 [IU]

## 2017-10-05 NOTE — Progress Notes (Signed)
Discharge note addendum: Patient was kept overnight because of shortness of breath. She continued to diurese and felt much better this morning.  Exam: HEENT: No mouth sores  lungs: Fine crackles at the bases much improved than yesterday Heart: S1-S2 regular Abdomen: Soft Extremities no edema Neurologic exam grossly intact  CBC    Component Value Date/Time   WBC 123.1 (HH) 10/05/2017 0514   RBC 3.34 (L) 10/05/2017 0514   HGB 9.2 (L) 10/05/2017 0514   HGB 12.8 09/21/2017 0950   HCT 29.5 (L) 10/05/2017 0514   HCT 39.8 09/21/2017 0950   PLT 274 10/05/2017 0514   PLT 235 09/21/2017 0950   PLT 342 08/11/2017 0928   MCV 88.3 10/05/2017 0514   MCV 85.8 09/21/2017 0950   MCH 27.5 10/05/2017 0514   MCHC 31.2 10/05/2017 0514   RDW 17.1 (H) 10/05/2017 0514   RDW 17.7 (H) 09/21/2017 0950   LYMPHSABS 103.4 (H) 10/05/2017 0514   LYMPHSABS 107.4 (H) 08/24/2017 0908   MONOABS 4.9 (H) 10/05/2017 0514   MONOABS 3.6 (H) 08/24/2017 0908   EOSABS 0.0 10/05/2017 0514   EOSABS 0.2 09/21/2017 0950   EOSABS 0.8 (H) 08/11/2017 0928   BASOSABS 0.0 10/05/2017 0514   BASOSABS 0.7 (H) 09/21/2017 0950   -CLL/SLL status post dose escalation of Venetoclax without any evidence of tumor lysis -Dr. Alvy Bimler we will plan her next admission for dose escalation. -CHF/A.Fib

## 2017-10-05 NOTE — Progress Notes (Signed)
Pt d/c to home. PICC line to stay in place for long term use. PICC RN flushed and cared for line for discharge. Discharge instructions, after visit summary, medications, and reasons to return to ED/MD reviewed with pt. Pt confirmed learning with teachback. Pt escorted off of unit to care and car of friend.

## 2017-10-07 ENCOUNTER — Other Ambulatory Visit: Payer: Self-pay | Admitting: Hematology and Oncology

## 2017-10-07 ENCOUNTER — Telehealth: Payer: Self-pay | Admitting: *Deleted

## 2017-10-07 DIAGNOSIS — C9110 Chronic lymphocytic leukemia of B-cell type not having achieved remission: Secondary | ICD-10-CM

## 2017-10-07 NOTE — Telephone Encounter (Signed)
-----   Message from Enis Gash, [facility] sent at 10/07/2017 10:47 AM EST ----- Regarding: RE: labs monitoring and appt Patient should be instructed to wait until labs done at 8am before taking dose. She can get baseline labs, then take dose here in office real quick.  Tyger Oka can tell patient this when she calls, if OK.  Denyse Amass  ----- Message ----- From: Heath Lark, MD Sent: 10/07/2017  10:38 AM To: Patton Salles, RN, Enis Gash, [facility] Subject: labs monitoring and appt                       Hi [facility],  Can you call her and ask if she can come in tomorrow at 8 am for labs followed by 2 pm labs? If yes, please let Denyse Amass and I know and please send urgent msg to scheduler for labs  Denyse Amass, can you instruct her to go up on her dose if so?  Thanks

## 2017-10-07 NOTE — Telephone Encounter (Signed)
Pt notified to come to [facility] tomorrow @ 0800 for labs. She understands that she is NOT to take Venclexta until after she has labs drawn. Will return at 2:00 pm for repeat labs. Verbalized understanding  Msg to scheduler

## 2017-10-08 ENCOUNTER — Telehealth: Payer: Self-pay

## 2017-10-08 ENCOUNTER — Other Ambulatory Visit: Payer: BLUE CROSS/BLUE SHIELD

## 2017-10-08 ENCOUNTER — Other Ambulatory Visit (HOSPITAL_BASED_OUTPATIENT_CLINIC_OR_DEPARTMENT_OTHER): Payer: BLUE CROSS/BLUE SHIELD

## 2017-10-08 DIAGNOSIS — C9110 Chronic lymphocytic leukemia of B-cell type not having achieved remission: Secondary | ICD-10-CM

## 2017-10-08 DIAGNOSIS — E790 Hyperuricemia without signs of inflammatory arthritis and tophaceous disease: Secondary | ICD-10-CM

## 2017-10-08 LAB — CBC WITH DIFFERENTIAL/PLATELET
BASO%: 0.8 % (ref 0.0–2.0)
BASO%: 0.8 % (ref 0.0–2.0)
BASOS ABS: 1 10*3/uL — AB (ref 0.0–0.1)
Basophils Absolute: 1 10*3/uL — ABNORMAL HIGH (ref 0.0–0.1)
EOS%: 0.2 % (ref 0.0–7.0)
EOS%: 0.3 % (ref 0.0–7.0)
Eosinophils Absolute: 0.3 10*3/uL (ref 0.0–0.5)
Eosinophils Absolute: 0.4 10*3/uL (ref 0.0–0.5)
HCT: 35.8 % (ref 34.8–46.6)
HEMATOCRIT: 37.4 % (ref 34.8–46.6)
HEMOGLOBIN: 10.5 g/dL — AB (ref 11.6–15.9)
HGB: 10.8 g/dL — ABNORMAL LOW (ref 11.6–15.9)
LYMPH#: 128.4 10*3/uL — AB (ref 0.9–3.3)
LYMPH%: 93.5 % — ABNORMAL HIGH (ref 14.0–49.7)
LYMPH%: 90.8 % — AB (ref 14.0–49.7)
MCH: 26.3 pg (ref 25.1–34.0)
MCH: 26.6 pg (ref 25.1–34.0)
MCHC: 28.8 g/dL — ABNORMAL LOW (ref 31.5–36.0)
MCHC: 29.3 g/dL — ABNORMAL LOW (ref 31.5–36.0)
MCV: 91.4 fL (ref 79.5–101.0)
MCV: 90.9 fL (ref 79.5–101.0)
MONO#: 1.3 10*3/uL — ABNORMAL HIGH (ref 0.1–0.9)
MONO#: 2.7 10*3/uL — ABNORMAL HIGH (ref 0.1–0.9)
MONO%: 0.9 % (ref 0.0–14.0)
MONO%: 2.1 % (ref 0.0–14.0)
NEUT#: 7.9 10*3/uL — ABNORMAL HIGH (ref 1.5–6.5)
NEUT%: 4.6 % — ABNORMAL LOW (ref 38.4–76.8)
NEUT%: 6 % — ABNORMAL LOW (ref 38.4–76.8)
NEUTROS ABS: 6.3 10*3/uL (ref 1.5–6.5)
Platelets: 338 10*3/uL (ref 145–400)
Platelets: 336 10*3/uL (ref 145–400)
RBC: 4.09 10*6/uL (ref 3.70–5.45)
RBC: 3.94 10*6/uL (ref 3.70–5.45)
RDW: 17.8 % — AB (ref 11.2–14.5)
RDW: 17.6 % — ABNORMAL HIGH (ref 11.2–14.5)
WBC: 137.3 10*3/uL — AB (ref 3.9–10.3)
WBC: 131.9 10*3/uL — AB (ref 3.9–10.3)
lymph#: 119.8 10*3/uL — ABNORMAL HIGH (ref 0.9–3.3)

## 2017-10-08 LAB — COMPREHENSIVE METABOLIC PANEL
ALBUMIN: 3.3 g/dL — AB (ref 3.5–5.0)
ALBUMIN: 3.2 g/dL — AB (ref 3.5–5.0)
ALK PHOS: 66 U/L (ref 40–150)
ALK PHOS: 70 U/L (ref 40–150)
ALT: 19 U/L (ref 0–55)
ALT: 17 U/L (ref 0–55)
ANION GAP: 11 meq/L (ref 3–11)
AST: 19 U/L (ref 5–34)
AST: 17 U/L (ref 5–34)
Anion Gap: 10 mEq/L (ref 3–11)
BUN: 10.6 mg/dL (ref 7.0–26.0)
BUN: 9.1 mg/dL (ref 7.0–26.0)
CALCIUM: 9.9 mg/dL (ref 8.4–10.4)
CO2: 25 mEq/L (ref 22–29)
CO2: 25 meq/L (ref 22–29)
Calcium: 9.7 mg/dL (ref 8.4–10.4)
Chloride: 107 mEq/L (ref 98–109)
Chloride: 106 mEq/L (ref 98–109)
Creatinine: 0.8 mg/dL (ref 0.6–1.1)
Creatinine: 0.8 mg/dL (ref 0.6–1.1)
GLUCOSE: 108 mg/dL (ref 70–140)
GLUCOSE: 130 mg/dL (ref 70–140)
POTASSIUM: 3.6 meq/L (ref 3.5–5.1)
Potassium: 4 mEq/L (ref 3.5–5.1)
SODIUM: 143 meq/L (ref 136–145)
SODIUM: 141 meq/L (ref 136–145)
Total Bilirubin: 0.51 mg/dL (ref 0.20–1.20)
Total Bilirubin: 0.55 mg/dL (ref 0.20–1.20)
Total Protein: 6.3 g/dL — ABNORMAL LOW (ref 6.4–8.3)
Total Protein: 6.2 g/dL — ABNORMAL LOW (ref 6.4–8.3)

## 2017-10-08 LAB — LACTATE DEHYDROGENASE
LDH: 436 U/L — ABNORMAL HIGH (ref 125–245)
LDH: 451 U/L — ABNORMAL HIGH (ref 125–245)

## 2017-10-08 LAB — URIC ACID
URIC ACID, SERUM: 3.5 mg/dL (ref 2.6–7.4)
URIC ACID, SERUM: 3.5 mg/dL (ref 2.6–7.4)

## 2017-10-08 LAB — TECHNOLOGIST REVIEW

## 2017-10-08 NOTE — Telephone Encounter (Signed)
Pt called at all 3 numbers provided.  No answer, no vm.  This RN will continue to attempt to contact pt. To review lab results.

## 2017-10-08 NOTE — Telephone Encounter (Signed)
Spoke with pt by phone to remind of lab appt this afternoon and to discuss Venetoclax dose. Pt states she took 50mg  after her lab appt this morning. She has one more day in her packet to take 50mg  and then dose is to increase to 100mg  on Saturday.

## 2017-10-08 NOTE — Telephone Encounter (Signed)
Spoke with pt by phone advising her of need to repeat labs tomorrow d/t she was supposed to increase her Venclexta dose to 100mg  today after her 0830 labs were drawn. Pt instructed to arrive at 0800 for blood work; take 100mg  Venclexta after blood is drawn, and then return at 1400 to repeat blood work.  Pt verbalized understanding.

## 2017-10-09 ENCOUNTER — Other Ambulatory Visit (HOSPITAL_BASED_OUTPATIENT_CLINIC_OR_DEPARTMENT_OTHER): Payer: BLUE CROSS/BLUE SHIELD

## 2017-10-09 ENCOUNTER — Telehealth: Payer: Self-pay | Admitting: *Deleted

## 2017-10-09 ENCOUNTER — Other Ambulatory Visit: Payer: BLUE CROSS/BLUE SHIELD

## 2017-10-09 ENCOUNTER — Ambulatory Visit (HOSPITAL_BASED_OUTPATIENT_CLINIC_OR_DEPARTMENT_OTHER): Payer: BLUE CROSS/BLUE SHIELD

## 2017-10-09 VITALS — BP 118/87 | HR 85 | Temp 98.6°F | Resp 20

## 2017-10-09 DIAGNOSIS — C9110 Chronic lymphocytic leukemia of B-cell type not having achieved remission: Secondary | ICD-10-CM

## 2017-10-09 DIAGNOSIS — E790 Hyperuricemia without signs of inflammatory arthritis and tophaceous disease: Secondary | ICD-10-CM

## 2017-10-09 DIAGNOSIS — Z95828 Presence of other vascular implants and grafts: Secondary | ICD-10-CM | POA: Diagnosis not present

## 2017-10-09 LAB — COMPREHENSIVE METABOLIC PANEL
ALBUMIN: 3.4 g/dL — AB (ref 3.5–5.0)
ALK PHOS: 69 U/L (ref 40–150)
ALT: 18 U/L (ref 0–55)
ALT: 19 U/L (ref 0–55)
ANION GAP: 9 meq/L (ref 3–11)
ANION GAP: 9 meq/L (ref 3–11)
AST: 18 U/L (ref 5–34)
AST: 18 U/L (ref 5–34)
Albumin: 3.4 g/dL — ABNORMAL LOW (ref 3.5–5.0)
Alkaline Phosphatase: 69 U/L (ref 40–150)
BILIRUBIN TOTAL: 0.57 mg/dL (ref 0.20–1.20)
BUN: 10.9 mg/dL (ref 7.0–26.0)
BUN: 11.1 mg/dL (ref 7.0–26.0)
CALCIUM: 10.1 mg/dL (ref 8.4–10.4)
CHLORIDE: 106 meq/L (ref 98–109)
CO2: 26 mEq/L (ref 22–29)
CO2: 25 meq/L (ref 22–29)
Calcium: 9.6 mg/dL (ref 8.4–10.4)
Chloride: 107 mEq/L (ref 98–109)
Creatinine: 0.8 mg/dL (ref 0.6–1.1)
Creatinine: 0.8 mg/dL (ref 0.6–1.1)
EGFR: >60 mL/min/{1.73_m2} (ref >60)
GLUCOSE: 97 mg/dL (ref 70–140)
GLUCOSE: 117 mg/dL (ref 70–140)
POTASSIUM: 3.9 meq/L (ref 3.5–5.1)
Potassium: 4.4 mEq/L (ref 3.5–5.1)
SODIUM: 143 meq/L (ref 136–145)
SODIUM: 140 meq/L (ref 136–145)
TOTAL PROTEIN: 6.4 g/dL (ref 6.4–8.3)
Total Bilirubin: 0.55 mg/dL (ref 0.20–1.20)
Total Protein: 6.4 g/dL (ref 6.4–8.3)

## 2017-10-09 LAB — CBC WITH DIFFERENTIAL/PLATELET
BASO%: 1.1 % (ref 0.0–2.0)
BASO%: 0.9 % (ref 0.0–2.0)
BASOS ABS: 1.5 10*3/uL — AB (ref 0.0–0.1)
Basophils Absolute: 1.2 10*3/uL — ABNORMAL HIGH (ref 0.0–0.1)
EOS ABS: 0.2 10*3/uL (ref 0.0–0.5)
EOS%: 0.1 % (ref 0.0–7.0)
EOS%: 0.1 % (ref 0.0–7.0)
Eosinophils Absolute: 0.2 10*3/uL (ref 0.0–0.5)
HCT: 36.3 % (ref 34.8–46.6)
HCT: 35.5 % (ref 34.8–46.6)
HGB: 10.9 g/dL — ABNORMAL LOW (ref 11.6–15.9)
HGB: 10.6 g/dL — ABNORMAL LOW (ref 11.6–15.9)
LYMPH%: 93.1 % — ABNORMAL HIGH (ref 14.0–49.7)
LYMPH%: 91.8 % — AB (ref 14.0–49.7)
MCH: 27.2 pg (ref 25.1–34.0)
MCH: 27 pg (ref 25.1–34.0)
MCHC: 30 g/dL — ABNORMAL LOW (ref 31.5–36.0)
MCHC: 29.7 g/dL — AB (ref 31.5–36.0)
MCV: 90.7 fL (ref 79.5–101.0)
MCV: 90.7 fL (ref 79.5–101.0)
MONO#: 2.1 10*3/uL — AB (ref 0.1–0.9)
MONO#: 2.5 10*3/uL — ABNORMAL HIGH (ref 0.1–0.9)
MONO%: 1.6 % (ref 0.0–14.0)
MONO%: 2 % (ref 0.0–14.0)
NEUT#: 5.3 10*3/uL (ref 1.5–6.5)
NEUT%: 4.1 % — ABNORMAL LOW (ref 38.4–76.8)
NEUT%: 5.2 % — AB (ref 38.4–76.8)
NEUTROS ABS: 6.4 10*3/uL (ref 1.5–6.5)
PLATELETS: 359 10*3/uL (ref 145–400)
PLATELETS: 331 10*3/uL (ref 145–400)
RBC: 4 10*6/uL (ref 3.70–5.45)
RBC: 3.92 10*6/uL (ref 3.70–5.45)
RDW: 17.8 % — ABNORMAL HIGH (ref 11.2–14.5)
RDW: 18 % — ABNORMAL HIGH (ref 11.2–14.5)
WBC: 128 10*3/uL (ref 3.9–10.3)
WBC: 124 10*3/uL (ref 3.9–10.3)
lymph#: 119 10*3/uL — ABNORMAL HIGH (ref 0.9–3.3)
lymph#: 113.8 10*3/uL — ABNORMAL HIGH (ref 0.9–3.3)

## 2017-10-09 LAB — URIC ACID
URIC ACID, SERUM: 3.7 mg/dL (ref 2.6–7.4)
Uric Acid, Serum: 3.6 mg/dl (ref 2.6–7.4)

## 2017-10-09 LAB — PHOSPHORUS
Phosphorus, Ser: 3.2 mg/dL (ref 2.5–4.5)
Phosphorus, Ser: 3.8 mg/dL (ref 2.5–4.5)

## 2017-10-09 LAB — LACTATE DEHYDROGENASE
LDH: 388 U/L — AB (ref 125–245)
LDH: 377 U/L — AB (ref 125–245)

## 2017-10-09 LAB — TECHNOLOGIST REVIEW

## 2017-10-09 MED ORDER — HEPARIN SOD (PORK) LOCK FLUSH 100 UNIT/ML IV SOLN
500.0000 [IU] | Freq: Once | INTRAVENOUS | Status: AC
  Administered 2017-10-09: 500 [IU] via INTRAVENOUS
  Filled 2017-10-09: qty 5

## 2017-10-09 MED ORDER — SODIUM CHLORIDE 0.9% FLUSH
10.0000 mL | INTRAVENOUS | Status: DC | PRN
  Administered 2017-10-09: 10 mL via INTRAVENOUS
  Filled 2017-10-09: qty 10

## 2017-10-09 NOTE — Telephone Encounter (Signed)
Notified that labs look OK.

## 2017-10-09 NOTE — Patient Instructions (Signed)
PICC Home Guide °A peripherally inserted central catheter (PICC) is a long, thin, flexible tube that is inserted into a vein in the upper arm. It is a form of intravenous (IV) access. It is considered to be a "central" line because the tip of the PICC ends in a large vein in your chest. This large vein is called the superior vena cava (SVC). The PICC tip ends in the SVC because there is a lot of blood flow in the SVC. This allows medicines and IV fluids to be quickly distributed throughout the body. The PICC is inserted using a sterile technique by a specially trained nurse or physician. After the PICC is inserted, a chest X-ray exam is done to be sure it is in the correct place. °A PICC may be placed for different reasons, such as: °· To give medicines and liquid nutrition that can only be given through a central line. Examples are: °? Certain antibiotic treatments. °? Chemotherapy. °? Total parenteral nutrition (TPN). °· To take frequent blood samples. °· To give IV fluids and blood products. °· If there is difficulty placing a peripheral intravenous (PIV) catheter. ° °If taken care of properly, a PICC can remain in place for several months. A PICC can also allow a person to go home from the hospital early. Medicine and PICC care can be managed at home by a family member or home health care team. °What problems can happen when I have a PICC? °Problems with a PICC can occasionally occur. These may include the following: °· A blood clot (thrombus) forming in or at the tip of the PICC. This can cause the PICC to become clogged. A clot-dissolving medicine called tissue plasminogen activator (tPA) can be given through the PICC to help break up the clot. °· Inflammation of the vein (phlebitis) in which the PICC is placed. Signs of inflammation may include redness, pain at the insertion site, red streaks, or being able to feel a "cord" in the vein where the PICC is located. °· Infection in the PICC or at the insertion  site. Signs of infection may include fever, chills, redness, swelling, or pus drainage from the PICC insertion site. °· PICC movement (malposition). The PICC tip may move from its original position due to excessive physical activity, forceful coughing, sneezing, or vomiting. °· A break or cut in the PICC. It is important to not use scissors near the PICC. °· Nerve or tendon irritation or injury during PICC insertion. ° °What should I keep in mind about activities when I have a PICC? °· You may bend your arm and move it freely. If your PICC is near or at the bend of your elbow, avoid activity with repeated motion at the elbow. °· Rest at home for the remainder of the day following PICC line insertion. °· Avoid lifting heavy objects as instructed by your health care provider. °· Avoid using a crutch with the arm on the same side as your PICC. You may need to use a walker. °What should I know about my PICC dressing? °· Keep your PICC bandage (dressing) clean and dry to prevent infection. °? Ask your health care provider when you may shower. Ask your health care provider to teach you how to wrap the PICC when you do take a shower. °· Change the PICC dressing as instructed by your health care provider. °· Change your PICC dressing if it becomes loose or wet. °What should I know about PICC care? °· Check the PICC insertion   site daily for leakage, redness, swelling, or pain. °· Do not take a bath, swim, or use hot tubs when you have a PICC. Cover PICC line with clear plastic wrap and tape to keep it dry while showering. °· Flush the PICC as directed by your health care provider. Let your health care provider know right away if the PICC is difficult to flush or does not flush. Do not use force to flush the PICC. °· Do not use a syringe that is less than 10 mL to flush the PICC. °· Never pull or tug on the PICC. °· Avoid blood pressure checks on the arm with the PICC. °· Keep your PICC identification card with you at all  times. °· Do not take the PICC out yourself. Only a trained clinical professional should remove the PICC. °Get help right away if: °· Your PICC is accidentally pulled all the way out. If this happens, cover the insertion site with a bandage or gauze dressing. Do not throw the PICC away. Your health care provider will need to inspect it. °· Your PICC was tugged or pulled and has partially come out. Do not  push the PICC back in. °· There is any type of drainage, redness, or swelling where the PICC enters the skin. °· You cannot flush the PICC, it is difficult to flush, or the PICC leaks around the insertion site when it is flushed. °· You hear a "flushing" sound when the PICC is flushed. °· You have pain, discomfort, or numbness in your arm, shoulder, or jaw on the same side as the PICC. °· You feel your heart "racing" or skipping beats. °· You notice a hole or tear in the PICC. °· You develop chills or a fever. °This information is not intended to replace advice given to you by your health care provider. Make sure you discuss any questions you have with your health care provider. °Document Released: 03/29/2003 Document Revised: 04/11/2016 Document Reviewed: 07/15/2013 °[employer] Interactive Patient Education © 2017 [employer] ° °

## 2017-10-10 LAB — PHOSPHORUS
PHOSPHORUS: 4 mg/dL (ref 2.5–4.5)
Phosphorus, Ser: 4 mg/dL (ref 2.5–4.5)

## 2017-10-14 ENCOUNTER — Telehealth: Payer: Self-pay | Admitting: Hematology and Oncology

## 2017-10-14 NOTE — Telephone Encounter (Signed)
Scheduled appts per 1/7 sch msg - attempted to leave message for patient regarding appts however patient does not have a voicemail set up.

## 2017-10-16 ENCOUNTER — Other Ambulatory Visit: Payer: Self-pay | Admitting: Pharmacist

## 2017-10-16 ENCOUNTER — Telehealth: Payer: Self-pay | Admitting: Hematology and Oncology

## 2017-10-16 ENCOUNTER — Inpatient Hospital Stay: Payer: BLUE CROSS/BLUE SHIELD

## 2017-10-16 ENCOUNTER — Encounter: Payer: Self-pay | Admitting: Hematology and Oncology

## 2017-10-16 ENCOUNTER — Inpatient Hospital Stay: Payer: BLUE CROSS/BLUE SHIELD | Attending: Hematology and Oncology | Admitting: Hematology and Oncology

## 2017-10-16 DIAGNOSIS — Z79899 Other long term (current) drug therapy: Secondary | ICD-10-CM | POA: Diagnosis not present

## 2017-10-16 DIAGNOSIS — K4090 Unilateral inguinal hernia, without obstruction or gangrene, not specified as recurrent: Secondary | ICD-10-CM | POA: Diagnosis not present

## 2017-10-16 DIAGNOSIS — I509 Heart failure, unspecified: Secondary | ICD-10-CM | POA: Insufficient documentation

## 2017-10-16 DIAGNOSIS — I480 Paroxysmal atrial fibrillation: Secondary | ICD-10-CM | POA: Diagnosis not present

## 2017-10-16 DIAGNOSIS — I110 Hypertensive heart disease with heart failure: Secondary | ICD-10-CM | POA: Insufficient documentation

## 2017-10-16 DIAGNOSIS — R918 Other nonspecific abnormal finding of lung field: Secondary | ICD-10-CM | POA: Diagnosis not present

## 2017-10-16 DIAGNOSIS — Z9221 Personal history of antineoplastic chemotherapy: Secondary | ICD-10-CM | POA: Insufficient documentation

## 2017-10-16 DIAGNOSIS — Z95828 Presence of other vascular implants and grafts: Secondary | ICD-10-CM | POA: Insufficient documentation

## 2017-10-16 DIAGNOSIS — C9110 Chronic lymphocytic leukemia of B-cell type not having achieved remission: Secondary | ICD-10-CM | POA: Diagnosis not present

## 2017-10-16 DIAGNOSIS — I340 Nonrheumatic mitral (valve) insufficiency: Secondary | ICD-10-CM | POA: Diagnosis not present

## 2017-10-16 DIAGNOSIS — E049 Nontoxic goiter, unspecified: Secondary | ICD-10-CM | POA: Insufficient documentation

## 2017-10-16 DIAGNOSIS — J9601 Acute respiratory failure with hypoxia: Secondary | ICD-10-CM | POA: Diagnosis not present

## 2017-10-16 DIAGNOSIS — Z7901 Long term (current) use of anticoagulants: Secondary | ICD-10-CM | POA: Diagnosis not present

## 2017-10-16 DIAGNOSIS — I429 Cardiomyopathy, unspecified: Secondary | ICD-10-CM | POA: Diagnosis not present

## 2017-10-16 DIAGNOSIS — I700 Atherosclerosis of aorta: Secondary | ICD-10-CM | POA: Insufficient documentation

## 2017-10-16 LAB — COMPREHENSIVE METABOLIC PANEL
ALBUMIN: 3.8 g/dL (ref 3.5–5.0)
ALK PHOS: 62 U/L (ref 40–150)
ALK PHOS: 64 U/L (ref 40–150)
ALT: 17 U/L (ref 0–55)
ALT: 16 U/L (ref 0–55)
ANION GAP: 13 — AB (ref 3–11)
AST: 13 U/L (ref 5–34)
AST: 15 U/L (ref 5–34)
Albumin: 3.7 g/dL (ref 3.5–5.0)
Anion gap: 12 — ABNORMAL HIGH (ref 3–11)
BUN: 21 mg/dL (ref 7–26)
BUN: 21 mg/dL (ref 7–26)
CALCIUM: 9.8 mg/dL (ref 8.4–10.4)
CALCIUM: 9.6 mg/dL (ref 8.4–10.4)
CHLORIDE: 103 mmol/L (ref 98–109)
CO2: 23 mmol/L (ref 22–29)
CO2: 22 mmol/L (ref 22–29)
CREATININE: 0.9 mg/dL (ref 0.60–1.10)
CREATININE: 0.86 mg/dL (ref 0.60–1.10)
Chloride: 105 mmol/L (ref 98–109)
GFR calc Af Amer: >60 mL/min (ref >60)
GFR calc non Af Amer: >60 mL/min (ref >60)
GLUCOSE: 113 mg/dL (ref 70–140)
Glucose, Bld: 94 mg/dL (ref 70–140)
Potassium: 4.3 mmol/L (ref 3.3–4.7)
Potassium: 4.6 mmol/L (ref 3.3–4.7)
SODIUM: 137 mmol/L (ref 136–145)
Sodium: 141 mmol/L (ref 136–145)
TOTAL PROTEIN: 6.7 g/dL (ref 6.4–8.3)
Total Bilirubin: 0.7 mg/dL (ref 0.2–1.2)
Total Bilirubin: 0.7 mg/dL (ref 0.2–1.2)
Total Protein: 7 g/dL (ref 6.4–8.3)

## 2017-10-16 LAB — CBC WITH DIFFERENTIAL/PLATELET
BASOS ABS: 0.5 10*3/uL — AB (ref 0.0–0.1)
BASOS PCT: 1 %
Basophils Absolute: 0.9 10*3/uL — ABNORMAL HIGH (ref 0.0–0.1)
Basophils Relative: 2 %
EOS ABS: 0 10*3/uL (ref 0.0–0.5)
EOS PCT: 0 %
EOS PCT: 0 %
Eosinophils Absolute: 0.1 10*3/uL (ref 0.0–0.5)
HCT: 36.5 % (ref 34.8–46.6)
HCT: 36.6 % (ref 34.8–46.6)
HEMOGLOBIN: 11.2 g/dL — AB (ref 11.6–15.9)
HEMOGLOBIN: 11.4 g/dL — AB (ref 11.6–15.9)
LYMPHS ABS: 52.3 10*3/uL — AB (ref 0.9–3.3)
Lymphocytes Relative: 90 %
Lymphocytes Relative: 88 %
Lymphs Abs: 55.7 10*3/uL — ABNORMAL HIGH (ref 0.9–3.3)
MCH: 27.2 pg (ref 25.1–34.0)
MCH: 27.5 pg (ref 25.1–34.0)
MCHC: 30.5 g/dL — ABNORMAL LOW (ref 31.5–36.0)
MCHC: 31.1 g/dL — AB (ref 31.5–36.0)
MCV: 89 fL (ref 79.5–101.0)
MCV: 88.3 fL (ref 79.5–101.0)
MONO ABS: 0.7 10*3/uL (ref 0.1–0.9)
MONOS PCT: 1 %
Monocytes Absolute: 1.4 10*3/uL — ABNORMAL HIGH (ref 0.1–0.9)
Monocytes Relative: 2 %
NEUTROS PCT: 7 %
NEUTROS PCT: 9 %
Neutro Abs: 4.1 10*3/uL (ref 1.5–6.5)
Neutro Abs: 5 10*3/uL (ref 1.5–6.5)
PLATELETS: 340 10*3/uL (ref 145–400)
Platelets: 335 10*3/uL (ref 145–400)
RBC: 4.11 MIL/uL (ref 3.70–5.45)
RBC: 4.14 MIL/uL (ref 3.70–5.45)
RDW: 17.8 % — ABNORMAL HIGH (ref 11.2–16.1)
RDW: 17.7 % — AB (ref 11.2–16.1)
WBC: 61.7 10*3/uL — AB (ref 3.9–10.3)
WBC: 58.9 10*3/uL (ref 3.9–10.3)

## 2017-10-16 LAB — PHOSPHORUS
PHOSPHORUS: 3.9 mg/dL (ref 2.5–4.6)
Phosphorus: 3.8 mg/dL (ref 2.5–4.6)

## 2017-10-16 LAB — LACTATE DEHYDROGENASE
LDH: 277 U/L — ABNORMAL HIGH (ref 125–245)
LDH: 300 U/L — AB (ref 125–245)

## 2017-10-16 MED ORDER — VENETOCLAX 100 MG PO TABS: 200.0000 mg | ORAL_TABLET | Freq: Every day | ORAL | 0 refills | Status: DC

## 2017-10-16 MED ORDER — SODIUM CHLORIDE 0.9% FLUSH
10.0000 mL | Freq: Once | INTRAVENOUS | Status: AC
  Administered 2017-10-16: 10 mL
  Filled 2017-10-16: qty 10

## 2017-10-16 MED ORDER — HEPARIN SOD (PORK) LOCK FLUSH 100 UNIT/ML IV SOLN
500.0000 [IU] | Freq: Once | INTRAVENOUS | Status: AC
  Administered 2017-10-16: 250 [IU]
  Filled 2017-10-16: qty 5

## 2017-10-16 NOTE — Assessment & Plan Note (Signed)
She will continue medical management and anticoagulation therapy.

## 2017-10-16 NOTE — Assessment & Plan Note (Signed)
She has amazing response with higher dose of venetoclax Per protocol, I would increase the dose to 200 mg daily Return blood work this afternoon showed no evidence of tumor lysis syndrome She will continue low-dose prednisone at 10 mg daily I plan to see her back again next week for further management.

## 2017-10-16 NOTE — Progress Notes (Signed)
[facility] OFFICE PROGRESS NOTE  Patient Care Team: Janith Lima, MD as PCP - General (Internal Medicine)  SUMMARY OF ONCOLOGIC HISTORY:   CLL (chronic lymphocytic leukemia) ([place])   04/05/2015 Pathology Results    Accession: [id number] flow cytometry confirmed CLL. FISH was positive for p53 mutation      04/24/2015 Imaging    Extensive lymphadenopathy throughout the neck, chest (axilla), abdomen and pelvis, as detailed above, compatible with the reported clinical history of lymphoma. 2. Mild splenomegaly.      05/03/2015 - 08/27/2015 Chemotherapy    She started on Ibrutinib, discontinued prematurely when her prescription ran out      10/10/2015 - 05/29/2017 Chemotherapy    She was restarted back on Ibrutinib      11/13/2016 PET scan    Significant generalized reduction in size of numerous lymph nodes in the neck, chest, abdomen, and pelvis. Previously the activity of these nodes was low-level and in general a similar low-level activity is present today, significantly less than mediastinal blood pool activity, compatible with Deauville 2. 2. Coronary atherosclerosis. Mild cardiomegaly. 3. Mildly prominent endometrium for age without accentuated metabolic activity in the endometrium. Consider pelvic sonography for further characterization.      05/27/2017 PET scan    1. Progressive hypermetabolic adenopathy, primarily involving cervical, axillary, pelvic and inguinal lymph nodes bilaterally, consistent with progressive lymphoma. 2. No solid visceral organ or osseous involvement.      06/16/2017 -  Chemotherapy    The patient had palonosetron (ALOXI) injection 0.25 mg, 0.25 mg, Intravenous,  Once, 1 of 4 cycles  riTUXimab (RITUXAN) 200 mg in sodium chloride 0.9 % 250 mL (0.7407 mg/mL) chemo infusion, 100 mg/m2 = 200 mg (100 % of original dose 100 mg/m2), Intravenous,  Once, 1 of 4 cycles Dose modification: 100 mg/m2 (original dose 100 mg/m2, Cycle 1, Reason: Provider  Judgment, Comment: anticipate high risk infusion)  bendamustine (BENDEKA) 175 mg in sodium chloride 0.9 % 50 mL (3.0702 mg/mL) chemo infusion, 90 mg/m2 = 175 mg, Intravenous,  Once, 1 of 4 cycles  for chemotherapy treatment.  She received Rituximab and Bendamustine      07/15/2017 - 07/19/2017 Hospital Admission    The patient was briefly admitted to the hospital due to infusion reaction to rituximab      09/18/2017 PET scan    1. Continued considerable adenopathy in the neck, chest, abdomen, and pelvis. This is generally stable in size but mildly reduced in activity compared to the prior exam. Current levels of activity primarily Deauville 3 and Deauville 4. No splenomegaly. 2. Diffuse new ground-glass opacities in the lungs with associated hypermetabolic activity. Some forms of lymphoma infiltration can rarely cause this pattern of diffuse ground-glass opacity and hypermetabolic activity. Differential diagnostic considerations might include atypical pneumonia such as mycoplasma, acute hypersensitivity pneumonitis, or acute eosinophilic pneumonia. Pulmonary hemorrhage seems less likely to cause this degree of accentuated metabolic activity.  3. Aortic Atherosclerosis (ICD10-I70.0). Coronary atherosclerosis.      09/24/2017 -  Chemotherapy    The patient had ventoclax for chemotherapy treatment.         INTERVAL HISTORY: Please see below for problem oriented charting. She returns with her husband for further follow-up She is tolerating treatment well so far She continues to have shortness of breath on exertion Denies recent For adenopathy or infection. The patient denies any recent signs or symptoms of bleeding such as spontaneous epistaxis, hematuria or hematochezia.  REVIEW OF SYSTEMS:   Constitutional: Denies  fevers, chills or abnormal weight loss Eyes: Denies blurriness of vision Ears, nose, mouth, throat, and face: Denies mucositis or sore throat Cardiovascular: Denies  palpitation, chest discomfort or lower extremity swelling Gastrointestinal:  Denies nausea, heartburn or change in bowel habits Skin: Denies abnormal skin rashes Lymphatics: Denies new lymphadenopathy or easy bruising Neurological:Denies numbness, tingling or new weaknesses Behavioral/Psych: Mood is stable, no new changes  All other systems were reviewed with the patient and are negative.  I have reviewed the past medical history, past surgical history, social history and family history with the patient and they are unchanged from previous note.  ALLERGIES:  has No Known Allergies.  MEDICATIONS:  Current Outpatient Medications  Medication Sig Dispense Refill  . acyclovir (ZOVIRAX) 400 MG tablet TAKE 1 TABLET BY MOUTH EVERY DAY (Patient taking differently: Take 400 mg by mouth once a day) 30 tablet 3  . allopurinol (ZYLOPRIM) 300 MG tablet Take 1 tablet (300 mg total) by mouth daily. 30 tablet 3  . Cholecalciferol (VITAMIN D-3) 1000 units CAPS Take 1,000 Units by mouth daily with breakfast.    . diltiazem (CARDIZEM CD) 240 MG 24 hr capsule Take 1 capsule (240 mg total) daily by mouth. 30 capsule 5  . diltiazem 2 % GEL Apply 1 application topically 3 (three) times daily. Use for 6 weeks (Patient taking differently: Apply 1 application topically See admin instructions. TWO TIMES A DAY TO RECTAL AREA) 30 g 1  . ELIQUIS 5 MG TABS tablet TAKE 1 TABLET BY MOUTH TWICE A DAY (Patient taking differently: Take 5 mg by mouth two times a day) 180 tablet 1  . feeding supplement, ENSURE ENLIVE, (ENSURE ENLIVE) LIQD Take 237 mLs by mouth 2 (two) times daily between meals. 237 mL 12  . furosemide (LASIX) 40 MG tablet TAKE 1 TABLET BY MOUTH EVERY DAY (Patient taking differently: Take 40 mg by mouth once a day) 90 tablet 3  . metoprolol tartrate (LOPRESSOR) 25 MG tablet Take 1.5 tablets (37.5 mg total) by mouth 2 (two) times daily. 90 tablet 3  . ondansetron (ZOFRAN) 8 MG tablet Take 1 tablet (8 mg total) by  mouth every 8 (eight) hours as needed for refractory nausea / vomiting. 30 tablet 1  . predniSONE (DELTASONE) 10 MG tablet TAKE 1 TABLET BY MOUTH DAILY WITH BREAKFAST (Patient taking differently: Take 10 mg by mouth once a day with breakfast) 30 tablet 0  . prochlorperazine (COMPAZINE) 10 MG tablet Take 10 mg by mouth every 6 (six) hours as needed for nausea or vomiting.    . sacubitril-valsartan (ENTRESTO) 24-26 MG Take 1 tablet by mouth 2 (two) times daily. 60 tablet 3  . spironolactone (ALDACTONE) 25 MG tablet Take 25 mg by mouth daily.   1  . Venetoclax (VENCLEXTA) 100 MG TABS Take 200-400 mg by mouth daily. Take as directed with food and water for 30 days. 120 tablet 0   No current facility-administered medications for this visit.     PHYSICAL EXAMINATION: ECOG PERFORMANCE STATUS: 1 - Symptomatic but completely ambulatory  Vitals:   10/16/17 0919  BP: 140/84  Pulse: 72  Resp: 18  Temp: 97.6 F (36.4 C)  SpO2: 100%   Filed Weights   10/16/17 0919  Weight: 181 lb 14.4 oz (82.5 kg)    GENERAL:alert, no distress and comfortable.  She looks mildly cushingoid SKIN: skin color, texture, turgor are normal, no rashes or significant lesions EYES: normal, Conjunctiva are pink and non-injected, sclera clear OROPHARYNX:no exudate, no erythema and  lips, buccal mucosa, and tongue normal  NECK: supple, thyroid normal size, non-tender, without nodularity LYMPH:  no palpable lymphadenopathy in the cervical, axillary or inguinal LUNGS: clear to auscultation and percussion with normal breathing effort HEART: regular rate & rhythm and no murmurs and no lower extremity edema ABDOMEN:abdomen soft, non-tender and normal bowel sounds Musculoskeletal:no cyanosis of digits and no clubbing  NEURO: alert & oriented x 3 with fluent speech, no focal motor/sensory deficits  LABORATORY DATA:  I have reviewed the data as listed    Component Value Date/Time   NA 137 10/16/2017 1613   NA 140 10/09/2017  1416   K 4.6 10/16/2017 1613   K 4.4 10/09/2017 1416   CL 103 10/16/2017 1613   CL 105 01/18/2013 0912   CO2 22 10/16/2017 1613   CO2 25 10/09/2017 1416   GLUCOSE 113 10/16/2017 1613   GLUCOSE 117 10/09/2017 1416   GLUCOSE 83 01/18/2013 0912   BUN 21 10/16/2017 1613   BUN 11.1 10/09/2017 1416   CREATININE 0.86 10/16/2017 1613   CREATININE 0.8 10/09/2017 1416   CALCIUM 9.6 10/16/2017 1613   CALCIUM 9.6 10/09/2017 1416   PROT 7.0 10/16/2017 1613   PROT 6.4 10/09/2017 1416   ALBUMIN 3.8 10/16/2017 1613   ALBUMIN 3.4 (L) 10/09/2017 1416   AST 15 10/16/2017 1613   AST 18 10/09/2017 1416   ALT 16 10/16/2017 1613   ALT 19 10/09/2017 1416   ALKPHOS 64 10/16/2017 1613   ALKPHOS 69 10/09/2017 1416   BILITOT 0.7 10/16/2017 1613   BILITOT 0.57 10/09/2017 1416   GFRNONAA >60 10/16/2017 1613   GFRAA >60 10/16/2017 1613    No results found for: SPEP, UPEP  Lab Results  Component Value Date   WBC 58.9 (HH) 10/16/2017   NEUTROABS 5.0 10/16/2017   HGB 11.4 (L) 10/16/2017   HCT 36.6 10/16/2017   MCV 88.3 10/16/2017   PLT 335 10/16/2017      Chemistry      Component Value Date/Time   NA 137 10/16/2017 1613   NA 140 10/09/2017 1416   K 4.6 10/16/2017 1613   K 4.4 10/09/2017 1416   CL 103 10/16/2017 1613   CL 105 01/18/2013 0912   CO2 22 10/16/2017 1613   CO2 25 10/09/2017 1416   BUN 21 10/16/2017 1613   BUN 11.1 10/09/2017 1416   CREATININE 0.86 10/16/2017 1613   CREATININE 0.8 10/09/2017 1416      Component Value Date/Time   CALCIUM 9.6 10/16/2017 1613   CALCIUM 9.6 10/09/2017 1416   ALKPHOS 64 10/16/2017 1613   ALKPHOS 69 10/09/2017 1416   AST 15 10/16/2017 1613   AST 18 10/09/2017 1416   ALT 16 10/16/2017 1613   ALT 19 10/09/2017 1416   BILITOT 0.7 10/16/2017 1613   BILITOT 0.57 10/09/2017 1416       RADIOGRAPHIC STUDIES: I have personally reviewed the radiological images as listed and agreed with the findings in the report. Dg Chest 2 View  Result Date:  10/02/2017 CLINICAL DATA:  Sob; f/u CHF. History of CLL and atrial fibrillation. EXAM: CHEST  2 VIEW COMPARISON:  09/22/2017 FINDINGS: Heart is enlarged. Patient has new right sided PICC line, tip overlying the level of superior vena cava. There are patchy opacities within the lungs bilaterally, increased over prior study and compatible with increased edema. Infectious infiltrate could have a similar appearance. IMPRESSION: Increased bilateral airspace filling opacities, likely representing increased edema. Electronically Signed   By: Nolon Nations M.D.  On: 10/02/2017 14:35   Dg Chest 2 View  Result Date: 09/22/2017 CLINICAL DATA:  Sob. Pt has been having sob for 3 days w/ a dry cough. She said sometimes she'll cough until she feels like throwing up, but doesn't. Pt stated it just feels like her sob and cough are getting worse. Pt displayed heavy breathing after talking for hx and following respirations for exam. Hx of A-fib, cystic breast, diffuse cystic mastopathy, HTN, mitral regurgitation, persistent A-fib w/ rapid ventricular response EXAM: CHEST  2 VIEW COMPARISON:  Chest CT, 09/22/2017.  Chest radiograph, 07/15/2017. FINDINGS: Mild enlargement of the cardiac silhouette, stable. Mild thickening along the right peritracheal stripe. Mild prominence of the hila. This reflects the adenopathy better seen on the current CT. Subtle patchy hazy opacities are noted in the lungs, most evident in the left mid to upper lung on the frontal view. This was also better appreciated on the current CT. The ground-glass opacity noted in the lower lungs on the current CT is not well appreciated radiographically. No new lung abnormalities. No pleural effusion. No pneumothorax. Skeletal structures are unremarkable. Subtle soft tissue masses are evident along the axilla consistent with the adenopathy noted on the current CT. IMPRESSION: 1. Lung opacities noted on the current CT are not as well-defined radiographically,  only subtly evident. No new lung abnormalities. 2. Mediastinal and hilar adenopathy as well as axillary adenopathy is also less well-defined than it was on the current CT. Electronically Signed   By: Lajean Manes M.D.   On: 09/22/2017 17:40   Ct Chest W Contrast  Result Date: 09/22/2017 CLINICAL DATA:  57 year old female with increasing shortness of breath for the past 3 days. History of chronic cough. Evaluate for pneumonia. History of chronic lymphocytic leukemia (CLL) and atrial fibrillation. EXAM: CT CHEST WITH CONTRAST TECHNIQUE: Multidetector CT imaging of the chest was performed during intravenous contrast administration. CONTRAST:  69m ISOVUE-300 IOPAMIDOL (ISOVUE-300) INJECTION 61% COMPARISON:  PET-CT 09/18/2017. FINDINGS: Cardiovascular: Heart size is borderline enlarged. There is no significant pericardial fluid, thickening or pericardial calcification. There is aortic atherosclerosis, as well as atherosclerosis of the great vessels of the mediastinum and the coronary arteries, including calcified atherosclerotic plaque in the left anterior descending, left circumflex and right coronary arteries. Dilatation of the pulmonic trunk (4.1 cm in diameter), concerning for pulmonary arterial hypertension. Mediastinum/Nodes: Extensive mediastinal and bilateral hilar lymphadenopathy, similar to recent PET-CT, with the largest lymph nodes measuring up to 18 mm in short axis in the right hilar region. Esophagus is unremarkable in appearance. Numerous enlarged axillary lymph nodes bilaterally measuring up to 17 mm in short axis on the left and 18 mm in short axis on the right. Lungs/Pleura: As with the recent PET-CT there is extensive patchy ground-glass attenuation scattered throughout the lungs bilaterally, the severity of which is very similar to the prior study. A few scattered areas of more dense peribronchovascular airspace consolidation are also noted, slightly increased compared to the recent  examination. No pleural effusions. No definite discrete suspicious pulmonary nodules or masses. Upper Abdomen: Spleen is incompletely visualized but appears likely enlarged measuring up to of 4.0 x 12.0 cm Musculoskeletal: There are no aggressive appearing lytic or blastic lesions noted in the visualized portions of the skeleton. IMPRESSION: 1. Unusual appearance of the lung parenchyma with progressive ground-glass attenuation and developing areas of patchy peribronchovascular consolidation throughout the lungs bilaterally. 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 Alternatively, findings may simply reflect evolving atypical  infection in the setting of underlying CLL, or are less likely related to direct pulmonic leukemic infiltration. 2. Widespread mediastinal, bilateral hilar and bilateral axillary lymphadenopathy related to underlying CLL. Probable splenomegaly also noted. 3. Aortic atherosclerosis, in addition to three-vessel coronary artery disease. Please note that although the presence of coronary artery calcium documents the presence of coronary artery disease, the severity of this disease and any potential stenosis cannot be assessed on this non-gated CT examination. Assessment for potential risk factor modification, dietary therapy or pharmacologic therapy may be warranted, if clinically indicated. 4. Dilatation of the pulmonic trunk (4.1 cm in diameter), concerning for pulmonary arterial hypertension. Aortic Atherosclerosis (ICD10-I70.0). Electronically Signed   By: Vinnie Langton M.D.   On: 09/22/2017 12:02   Nm Pet Image Restag (ps) Skull Base To Thigh  Result Date: 09/18/2017 CLINICAL DATA:  Subsequent treatment strategy for chronic lymphocytic leukemia. EXAM: NUCLEAR MEDICINE PET SKULL BASE TO THIGH TECHNIQUE: 9.3 mCi F-18 FDG was injected intravenously. Full-ring PET imaging was performed from the skull base to thigh after the  radiotracer. CT data was obtained and used for attenuation correction and anatomic localization. FASTING BLOOD GLUCOSE:  Value: 97 mg/dl COMPARISON:  Multiple exams, including 05/27/2017 FINDINGS: NECK Bilateral level IIa, level II B, level V, level III, and level IV lymph nodes are present as on the prior exam. An index right level II B lymph node measures 1.3 cm in short axis on image 33/4 (formerly 1.4 cm) and has a maximum SUV of 3.3 (formerly 4.7). CHEST Bilateral hypermetabolic axillary, paratracheal, AP window, supraclavicular, hilar, and infrahilar adenopathy observed. Index left axillary node 1.9 cm in short axis on image 54/4 with maximum SUV 3.0 (formerly 1.8 cm by my measurement with maximum SUV 3.9). There are patchy bilateral ground-glass opacities throughout the lungs with associated accentuated metabolic activity, new from the prior exam. Accentuated activity in the right posterior hemithorax with maximum SUV of 5.8. Some of the areas are of hypermetabolic activity are more confluent and in the right upper lobe an area of confluent metabolic activity has maximum SUV of 8.9 but with only indistinct ground-glass opacity on the lung window images. Thyroid goiter without hypermetabolic activity. Coronary artery atherosclerosis with mild cardiomegaly. Background blood pool activity 2.3 ABDOMEN/PELVIS Pathologic right gastric, peripancreatic, central mesenteric, retroperitoneal/periaortic, external iliac, common iliac, inguinal, and obturator adenopathy noted. A representative index right obturator lymph node measures 1.5 cm in short axis on image 166/4 and has maximum SUV of 5.0 (formerly 1.5 cm in diameter with maximum SUV 4.3). The left obturator node measures 1.6 cm in short axis on image 167/4 (formerly 1.8 cm) and has maximum SUV of 3.5 (formerly 4.5). The spleen measures 10.3 by 4.6 by 11.7 cm (volume = 290 cm^3). No focal splenic abnormal activity identified. Aortoiliac atherosclerotic vascular  disease. Small indirect inguinal hernias contain adipose tissue. Background hepatic activity 4.1. SKELETON No focal hypermetabolic activity to suggest skeletal metastasis. IMPRESSION: 1. Continued considerable adenopathy in the neck, chest, abdomen, and pelvis. This is generally stable in size but mildly reduced in activity compared to the prior exam. Current levels of activity primarily Deauville 3 and Deauville 4. No splenomegaly. 2. Diffuse new ground-glass opacities in the lungs with associated hypermetabolic activity. Some forms of lymphoma infiltration can rarely cause this pattern of diffuse ground-glass opacity and hypermetabolic activity. Differential diagnostic considerations might include atypical pneumonia such as mycoplasma, acute hypersensitivity pneumonitis, or acute eosinophilic pneumonia. Pulmonary hemorrhage seems less likely to cause this degree of accentuated metabolic  activity. 3.  Aortic Atherosclerosis (ICD10-I70.0).  Coronary atherosclerosis. Electronically Signed   By: Van Clines M.D.   On: 09/18/2017 10:05    ASSESSMENT & PLAN:  CLL (chronic lymphocytic leukemia) ([place]) She has amazing response with higher dose of venetoclax Per protocol, I would increase the dose to 200 mg daily Return blood work this afternoon showed no evidence of tumor lysis syndrome She will continue low-dose prednisone at 10 mg daily I plan to see her back again next week for further management.  Acute respiratory failure with hypoxia (HCC) She has pulmonary infiltrate secondary to CLL Saturation is 100% I suspect her significant improvement is correlating with disease response We will continue oxygen weaning effort in the near future  PAF (paroxysmal atrial fibrillation) [facility]) She will continue medical management and anticoagulation therapy.   No orders of the defined types were placed in this encounter.  All questions were answered. The patient knows to call the clinic with any  problems, questions or concerns. No barriers to learning was detected. I spent 15 minutes counseling the patient face to face. The total time spent in the appointment was 20 minutes and more than 50% was on counseling and review of test results     Heath Lark, MD 10/16/2017 5:25 PM

## 2017-10-16 NOTE — Telephone Encounter (Signed)
Gave patient AVs and calendar of upcoming January appointments.  °

## 2017-10-16 NOTE — Assessment & Plan Note (Signed)
She has pulmonary infiltrate secondary to CLL Saturation is 100% I suspect her significant improvement is correlating with disease response We will continue oxygen weaning effort in the near future

## 2017-10-20 ENCOUNTER — Telehealth: Payer: Self-pay

## 2017-10-20 NOTE — Telephone Encounter (Signed)
Nutrition  Patient identified on Malnutrition Screening report with poor appetite and weight loss.  Called home number and unable to leave message.  Called cell number and female answered but could not hear RD on the line and hung up.  Please refer patient for nutrition consult if poor appetite and weight loss continues  Dam Ashraf B. Zenia Resides, [place], [place] Registered Dietitian [phone] (pager)

## 2017-10-21 ENCOUNTER — Other Ambulatory Visit: Payer: Self-pay | Admitting: Cardiology

## 2017-10-21 MED ORDER — SACUBITRIL-VALSARTAN 24-26 MG PO TABS: 1.0000 | ORAL_TABLET | Freq: Two times a day (BID) | ORAL | 3 refills | Status: DC

## 2017-10-21 NOTE — Telephone Encounter (Signed)
Pt's medication was sent to pt's pharmacy as requested. Confirmation received.  °

## 2017-10-22 ENCOUNTER — Other Ambulatory Visit: Payer: Self-pay | Admitting: Hematology and Oncology

## 2017-10-22 ENCOUNTER — Encounter: Payer: Self-pay | Admitting: Internal Medicine

## 2017-10-22 ENCOUNTER — Ambulatory Visit: Payer: BLUE CROSS/BLUE SHIELD | Admitting: Internal Medicine

## 2017-10-22 ENCOUNTER — Ambulatory Visit (INDEPENDENT_AMBULATORY_CARE_PROVIDER_SITE_OTHER)
Admission: RE | Admit: 2017-10-22 | Discharge: 2017-10-22 | Disposition: A | Discharge status: Home / Self Care | Payer: BLUE CROSS/BLUE SHIELD | Source: Ambulatory Visit | Attending: Internal Medicine | Admitting: Internal Medicine

## 2017-10-22 VITALS — BP 128/80 | HR 71 | Temp 97.7°F | Resp 16 | Ht 67.0 in | Wt 189.0 lb

## 2017-10-22 DIAGNOSIS — M546 Pain in thoracic spine: Secondary | ICD-10-CM

## 2017-10-22 DIAGNOSIS — S3992XA Unspecified injury of lower back, initial encounter: Secondary | ICD-10-CM | POA: Diagnosis not present

## 2017-10-22 DIAGNOSIS — M5440 Lumbago with sciatica, unspecified side: Secondary | ICD-10-CM | POA: Insufficient documentation

## 2017-10-22 DIAGNOSIS — M545 Low back pain: Secondary | ICD-10-CM | POA: Diagnosis not present

## 2017-10-22 DIAGNOSIS — S32010A Wedge compression fracture of first lumbar vertebra, initial encounter for closed fracture: Secondary | ICD-10-CM

## 2017-10-22 DIAGNOSIS — S299XXA Unspecified injury of thorax, initial encounter: Secondary | ICD-10-CM | POA: Diagnosis not present

## 2017-10-22 DIAGNOSIS — C9110 Chronic lymphocytic leukemia of B-cell type not having achieved remission: Secondary | ICD-10-CM

## 2017-10-22 MED ORDER — OXYCODONE HCL 5 MG PO TABS: 5.0000 mg | ORAL_TABLET | ORAL | 0 refills | Status: DC | PRN

## 2017-10-22 MED FILL — VENCLEXTA 100 MG TABS: 100 | 30 days supply | Qty: 120 | Fill #0

## 2017-10-22 NOTE — Progress Notes (Signed)
Subjective:  Patient ID: Audrey Gross, female    DOB: [birth year]  Age: 57 y.o. MRN: [id number]  CC: Back Pain   HPI Audrey Gross presents for concerns about thoracic and low back pain 1 week after falling off a stool in her house.  She was trying to change a light fixture and fell.  Her most significant pain is her lower back where she describes a severe achy sensation that does not radiate into her lower extremities.  She also has aching in her thoracic spine.  She denies numbness, weakness, tingling in the lower extremities.  She has not taken anything for pain.  She cannot take NSAIDs because she is anticoagulated.  Outpatient Medications Prior to Visit  Medication Sig Dispense Refill  . acyclovir (ZOVIRAX) 400 MG tablet TAKE 1 TABLET BY MOUTH EVERY DAY (Patient taking differently: Take 400 mg by mouth once a day) 30 tablet 3  . Cholecalciferol (VITAMIN D-3) 1000 units CAPS Take 1,000 Units by mouth daily with breakfast.    . diltiazem (CARDIZEM CD) 240 MG 24 hr capsule Take 1 capsule (240 mg total) daily by mouth. 30 capsule 5  . diltiazem 2 % GEL Apply 1 application topically 3 (three) times daily. Use for 6 weeks (Patient taking differently: Apply 1 application topically See admin instructions. TWO TIMES A DAY TO RECTAL AREA) 30 g 1  . ELIQUIS 5 MG TABS tablet TAKE 1 TABLET BY MOUTH TWICE A DAY (Patient taking differently: Take 5 mg by mouth two times a day) 180 tablet 1  . feeding supplement, ENSURE ENLIVE, (ENSURE ENLIVE) LIQD Take 237 mLs by mouth 2 (two) times daily between meals. 237 mL 12  . furosemide (LASIX) 40 MG tablet TAKE 1 TABLET BY MOUTH EVERY DAY (Patient taking differently: Take 40 mg by mouth once a day) 90 tablet 3  . metoprolol tartrate (LOPRESSOR) 25 MG tablet Take 1.5 tablets (37.5 mg total) by mouth 2 (two) times daily. 90 tablet 3  . ondansetron (ZOFRAN) 8 MG tablet Take 1 tablet (8 mg total) by mouth every 8 (eight) hours as needed for refractory nausea /  vomiting. 30 tablet 1  . predniSONE (DELTASONE) 10 MG tablet TAKE 1 TABLET BY MOUTH DAILY WITH BREAKFAST (Patient taking differently: Take 10 mg by mouth once a day with breakfast) 30 tablet 0  . prochlorperazine (COMPAZINE) 10 MG tablet Take 10 mg by mouth every 6 (six) hours as needed for nausea or vomiting.    . sacubitril-valsartan (ENTRESTO) 24-26 MG Take 1 tablet by mouth 2 (two) times daily. 180 tablet 3  . spironolactone (ALDACTONE) 25 MG tablet Take 25 mg by mouth daily.   1  . Venetoclax (VENCLEXTA) 100 MG TABS Take 200-400 mg by mouth daily. Take as directed with food and water for 30 days. 120 tablet 0  . allopurinol (ZYLOPRIM) 300 MG tablet Take 1 tablet (300 mg total) by mouth daily. 30 tablet 3   No facility-administered medications prior to visit.     ROS Review of Systems  Constitutional: Positive for fatigue. Negative for chills and fever.  HENT: Negative.   Eyes: Negative.   Respiratory: Negative for cough, chest tightness, shortness of breath and wheezing.   Cardiovascular: Negative for chest pain, palpitations and leg swelling.  Gastrointestinal: Negative for abdominal pain, constipation, diarrhea, nausea and vomiting.  Endocrine: Negative.   Genitourinary: Negative.  Negative for difficulty urinating.  Musculoskeletal: Positive for back pain. Negative for myalgias and neck pain.  Skin: Negative.  Negative for rash.  Neurological: Negative.  Negative for dizziness, syncope, weakness, light-headedness and numbness.  Hematological: Negative for adenopathy. Does not bruise/bleed easily.  Psychiatric/Behavioral: Negative.     Objective:  BP 128/80 (BP Location: Left Arm, Patient Position: Sitting, Cuff Size: Normal)   Pulse 71   Temp 97.7 F (36.5 C) (Oral)   Resp 16   Ht 5\' 7"  (1.702 m)   Wt 189 lb (85.7 kg)   SpO2 98%   BMI 29.60 kg/m   BP Readings from Last 3 Encounters:  10/23/17 135/79  10/22/17 128/80  10/16/17 140/84    Wt Readings from Last 3  Encounters:  10/23/17 182 lb 14.4 oz (83 kg)  10/22/17 189 lb (85.7 kg)  10/16/17 181 lb 14.4 oz (82.5 kg)    Physical Exam  Constitutional: She is oriented to person, place, and time. No distress.  HENT:  Mouth/Throat: Oropharynx is clear and moist. No oropharyngeal exudate.  Eyes: Conjunctivae are normal. Left eye exhibits no discharge. No scleral icterus.  Neck: Normal range of motion. No JVD present. No thyromegaly present.  Cardiovascular: Normal rate, regular rhythm and normal heart sounds.  No murmur heard. Pulmonary/Chest: Effort normal and breath sounds normal. No respiratory distress. She has no wheezes. She has no rales.  Abdominal: Soft. Bowel sounds are normal. She exhibits no mass. There is no tenderness.  Musculoskeletal: Normal range of motion. She exhibits no edema.       Thoracic back: She exhibits tenderness. She exhibits normal range of motion, no bony tenderness, no swelling, no edema, no deformity and no spasm.       Lumbar back: She exhibits tenderness and bony tenderness. She exhibits normal range of motion, no swelling, no edema, no deformity and no spasm.  Neurological: She is alert and oriented to person, place, and time. No sensory deficit. She exhibits normal muscle tone. She displays a negative Romberg sign. Coordination and gait normal.  Reflex Scores:      Tricep reflexes are 1+ on the right side and 1+ on the left side.      Bicep reflexes are 1+ on the right side and 1+ on the left side.      Brachioradialis reflexes are 1+ on the right side and 1+ on the left side.      Patellar reflexes are 1+ on the right side and 1+ on the left side.      Achilles reflexes are 0 on the right side and 0 on the left side. NEG SLR in BLE  Skin: Skin is warm and dry. No rash noted. She is not diaphoretic. No erythema.  Vitals reviewed.   Lab Results  Component Value Date   WBC 18.2 (H) 10/23/2017   HGB 11.3 (L) 10/23/2017   HCT 35.3 10/23/2017   PLT 278 10/23/2017    GLUCOSE 94 10/23/2017   CHOL 170 07/16/2017   TRIG 100 07/16/2017   HDL 31 (L) 07/16/2017   LDLDIRECT 144.5 07/26/2013   LDLCALC 119 (H) 07/16/2017   ALT 15 10/23/2017   AST 11 10/23/2017   NA 140 10/23/2017   K 3.9 10/23/2017   CL 105 10/23/2017   CREATININE 0.85 10/23/2017   BUN 13 10/23/2017   CO2 24 10/23/2017   TSH 0.05 (L) 09/22/2017   INR 1.40 07/16/2017    Dg Chest 2 View  Result Date: 09/22/2017 CLINICAL DATA:  Sob. Pt has been having sob for 3 days w/ a dry cough. She said sometimes she'll cough until  she feels like throwing up, but doesn't. Pt stated it just feels like her sob and cough are getting worse. Pt displayed heavy breathing after talking for hx and following respirations for exam. Hx of A-fib, cystic breast, diffuse cystic mastopathy, HTN, mitral regurgitation, persistent A-fib w/ rapid ventricular response EXAM: CHEST  2 VIEW COMPARISON:  Chest CT, 09/22/2017.  Chest radiograph, 07/15/2017. FINDINGS: Mild enlargement of the cardiac silhouette, stable. Mild thickening along the right peritracheal stripe. Mild prominence of the hila. This reflects the adenopathy better seen on the current CT. Subtle patchy hazy opacities are noted in the lungs, most evident in the left mid to upper lung on the frontal view. This was also better appreciated on the current CT. The ground-glass opacity noted in the lower lungs on the current CT is not well appreciated radiographically. No new lung abnormalities. No pleural effusion. No pneumothorax. Skeletal structures are unremarkable. Subtle soft tissue masses are evident along the axilla consistent with the adenopathy noted on the current CT. IMPRESSION: 1. Lung opacities noted on the current CT are not as well-defined radiographically, only subtly evident. No new lung abnormalities. 2. Mediastinal and hilar adenopathy as well as axillary adenopathy is also less well-defined than it was on the current CT. Electronically Signed   By: Lajean Manes M.D.   On: 09/22/2017 17:40   Ct Chest W Contrast  Result Date: 09/22/2017 CLINICAL DATA:  57 year old female with increasing shortness of breath for the past 3 days. History of chronic cough. Evaluate for pneumonia. History of chronic lymphocytic leukemia (CLL) and atrial fibrillation. EXAM: CT CHEST WITH CONTRAST TECHNIQUE: Multidetector CT imaging of the chest was performed during intravenous contrast administration. CONTRAST:  50mL ISOVUE-300 IOPAMIDOL (ISOVUE-300) INJECTION 61% COMPARISON:  PET-CT 09/18/2017. FINDINGS: Cardiovascular: Heart size is borderline enlarged. There is no significant pericardial fluid, thickening or pericardial calcification. There is aortic atherosclerosis, as well as atherosclerosis of the great vessels of the mediastinum and the coronary arteries, including calcified atherosclerotic plaque in the left anterior descending, left circumflex and right coronary arteries. Dilatation of the pulmonic trunk (4.1 cm in diameter), concerning for pulmonary arterial hypertension. Mediastinum/Nodes: Extensive mediastinal and bilateral hilar lymphadenopathy, similar to recent PET-CT, with the largest lymph nodes measuring up to 18 mm in short axis in the right hilar region. Esophagus is unremarkable in appearance. Numerous enlarged axillary lymph nodes bilaterally measuring up to 17 mm in short axis on the left and 18 mm in short axis on the right. Lungs/Pleura: As with the recent PET-CT there is extensive patchy ground-glass attenuation scattered throughout the lungs bilaterally, the severity of which is very similar to the prior study. A few scattered areas of more dense peribronchovascular airspace consolidation are also noted, slightly increased compared to the recent examination. No pleural effusions. No definite discrete suspicious pulmonary nodules or masses. Upper Abdomen: Spleen is incompletely visualized but appears likely enlarged measuring up to of 4.0 x 12.0 cm  Musculoskeletal: There are no aggressive appearing lytic or blastic lesions noted in the visualized portions of the skeleton. IMPRESSION: 1. Unusual appearance of the lung parenchyma with progressive ground-glass attenuation and developing areas of patchy peribronchovascular consolidation throughout the lungs bilaterally. 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 Alternatively, findings may simply reflect evolving atypical infection in the setting of underlying CLL, or are less likely related to direct pulmonic leukemic infiltration. 2. Widespread mediastinal, bilateral hilar and bilateral axillary lymphadenopathy related to underlying CLL. Probable splenomegaly also noted. 3.  Aortic atherosclerosis, in addition to three-vessel coronary artery disease. Please note that although the presence of coronary artery calcium documents the presence of coronary artery disease, the severity of this disease and any potential stenosis cannot be assessed on this non-gated CT examination. Assessment for potential risk factor modification, dietary therapy or pharmacologic therapy may be warranted, if clinically indicated. 4. Dilatation of the pulmonic trunk (4.1 cm in diameter), concerning for pulmonary arterial hypertension. Aortic Atherosclerosis (ICD10-I70.0). Electronically Signed   By: Vinnie Langton M.D.   On: 09/22/2017 12:02    Dg Thoracic Spine W/swimmers  Result Date: 10/22/2017 CLINICAL DATA:  [place] 10 days ago pain and stiffness in the mid to low back EXAM: THORACIC SPINE - 3 VIEWS COMPARISON:  Chest x-ray of 10/02/2017 FINDINGS: The thoracic vertebrae are in normal alignment. Intervertebral disc spaces appear normal. No prominent paravertebral soft tissue is seen. There is degenerative change at the C6-7 level. Right central venous line tip overlies the mid SVC. IMPRESSION: 1. Negative thoracic spine.  No acute abnormality. 2. Degenerative disc  disease at C6-7. Electronically Signed   By: Ivar Drape M.D.   On: 10/22/2017 16:22   Dg Lumbar Spine Complete  Result Date: 10/22/2017 CLINICAL DATA:  [place] 10 days ago with pain and stiffness in the mid to lower back EXAM: LUMBAR SPINE - COMPLETE 4+ VIEW COMPARISON:  PET-CT of 09/18/2017 FINDINGS: There is a mild anterolisthesis of L4 on L5 by 3 mm most likely degenerative in origin. Intervertebral disc spaces appear normal. However, there is a mild compression deformity of the anterior superior aspect of L1 vertebral body of approximately 10%. No retropulsion is seen. The SI joints appear normal. IMPRESSION: 1. Acute mild anterior superior compression deformity of L1 of approximately 10%. No retropulsion. 2. Mild anterolisthesis of L4 on L5 by 3 mm. Electronically Signed   By: Ivar Drape M.D.   On: 10/22/2017 16:20    Assessment & Plan:   Makyiah was seen today for back pain.  Diagnoses and all orders for this visit:  Acute midline low back pain with sciatica, sciatica laterality unspecified -     oxyCODONE (OXY IR/ROXICODONE) 5 MG immediate release tablet; Take 1 tablet (5 mg total) by mouth every 4 (four) hours as needed for severe pain. -     DG Lumbar Spine Complete; Future  Back injury, initial encounter -     DG Lumbar Spine Complete; Future -     DG Thoracic Spine W/Swimmers; Future  Acute bilateral thoracic back pain -     oxyCODONE (OXY IR/ROXICODONE) 5 MG immediate release tablet; Take 1 tablet (5 mg total) by mouth every 4 (four) hours as needed for severe pain. -     DG Thoracic Spine W/Swimmers; Future  Closed wedge compression fracture of first lumbar vertebra, initial encounter ([place])- X-ray confirms an L2 vertebral fracture with 10% involvement.  Will control the pain with oxycodone.  This is most likely related to her CLL and treatment for such.  She cannot take NSAIDs because she is fully anticoagulated.  She is neurologically intact.  Unless she develops new or worsening  neurological symptoms I do not think an MRI would be beneficial at this time.   I am having Audrey Gross start on oxyCODONE. I am also having her maintain her ondansetron, diltiazem, metoprolol tartrate, diltiazem, ELIQUIS, furosemide, predniSONE, acyclovir, Vitamin D-3, feeding supplement (ENSURE ENLIVE), spironolactone, prochlorperazine, venetoclax, and sacubitril-valsartan.  Meds ordered this encounter  Medications  . oxyCODONE (OXY IR/ROXICODONE)  5 MG immediate release tablet    Sig: Take 1 tablet (5 mg total) by mouth every 4 (four) hours as needed for severe pain.    Dispense:  30 tablet    Refill:  0     Follow-up: Return in about 3 weeks (around 11/12/2017).  [name], MD

## 2017-10-22 NOTE — Patient Instructions (Signed)

## 2017-10-23 ENCOUNTER — Inpatient Hospital Stay: Payer: BLUE CROSS/BLUE SHIELD

## 2017-10-23 ENCOUNTER — Telehealth: Payer: Self-pay | Admitting: Hematology and Oncology

## 2017-10-23 ENCOUNTER — Encounter: Payer: Self-pay | Admitting: Hematology and Oncology

## 2017-10-23 ENCOUNTER — Other Ambulatory Visit: Payer: Self-pay | Admitting: *Deleted

## 2017-10-23 ENCOUNTER — Inpatient Hospital Stay (HOSPITAL_BASED_OUTPATIENT_CLINIC_OR_DEPARTMENT_OTHER): Payer: BLUE CROSS/BLUE SHIELD | Admitting: Hematology and Oncology

## 2017-10-23 VITALS — BP 135/79 | HR 76 | Temp 97.6°F | Resp 18 | Ht 67.0 in | Wt 182.9 lb

## 2017-10-23 DIAGNOSIS — R918 Other nonspecific abnormal finding of lung field: Secondary | ICD-10-CM | POA: Diagnosis not present

## 2017-10-23 DIAGNOSIS — I340 Nonrheumatic mitral (valve) insufficiency: Secondary | ICD-10-CM | POA: Diagnosis not present

## 2017-10-23 DIAGNOSIS — Z7901 Long term (current) use of anticoagulants: Secondary | ICD-10-CM | POA: Diagnosis not present

## 2017-10-23 DIAGNOSIS — Z79899 Other long term (current) drug therapy: Secondary | ICD-10-CM | POA: Diagnosis not present

## 2017-10-23 DIAGNOSIS — J9601 Acute respiratory failure with hypoxia: Secondary | ICD-10-CM

## 2017-10-23 DIAGNOSIS — C9110 Chronic lymphocytic leukemia of B-cell type not having achieved remission: Secondary | ICD-10-CM

## 2017-10-23 DIAGNOSIS — I509 Heart failure, unspecified: Secondary | ICD-10-CM | POA: Diagnosis not present

## 2017-10-23 DIAGNOSIS — K4090 Unilateral inguinal hernia, without obstruction or gangrene, not specified as recurrent: Secondary | ICD-10-CM | POA: Diagnosis not present

## 2017-10-23 DIAGNOSIS — I700 Atherosclerosis of aorta: Secondary | ICD-10-CM | POA: Diagnosis not present

## 2017-10-23 DIAGNOSIS — Z9221 Personal history of antineoplastic chemotherapy: Secondary | ICD-10-CM

## 2017-10-23 DIAGNOSIS — I428 Other cardiomyopathies: Secondary | ICD-10-CM

## 2017-10-23 DIAGNOSIS — E049 Nontoxic goiter, unspecified: Secondary | ICD-10-CM | POA: Diagnosis not present

## 2017-10-23 DIAGNOSIS — I429 Cardiomyopathy, unspecified: Secondary | ICD-10-CM | POA: Diagnosis not present

## 2017-10-23 DIAGNOSIS — I110 Hypertensive heart disease with heart failure: Secondary | ICD-10-CM | POA: Diagnosis not present

## 2017-10-23 DIAGNOSIS — I480 Paroxysmal atrial fibrillation: Secondary | ICD-10-CM | POA: Diagnosis not present

## 2017-10-23 LAB — CBC WITH DIFFERENTIAL/PLATELET
BASOS PCT: 1 %
Basophils Absolute: 0.2 10*3/uL — ABNORMAL HIGH (ref 0.0–0.1)
Eosinophils Absolute: 0 10*3/uL (ref 0.0–0.5)
Eosinophils Relative: 0 %
HEMATOCRIT: 35.3 % (ref 34.8–46.6)
Hemoglobin: 11.3 g/dL — ABNORMAL LOW (ref 11.6–15.9)
Lymphocytes Relative: 79 %
Lymphs Abs: 14.4 10*3/uL — ABNORMAL HIGH (ref 0.9–3.3)
MCH: 28 pg (ref 25.1–34.0)
MCHC: 32 g/dL (ref 31.5–36.0)
MCV: 87.6 fL (ref 79.5–101.0)
Monocytes Absolute: 0.5 10*3/uL (ref 0.1–0.9)
Monocytes Relative: 3 %
NEUTROS ABS: 3.1 10*3/uL (ref 1.5–6.5)
Neutrophils Relative %: 17 %
PLATELETS: 278 10*3/uL (ref 145–400)
RBC: 4.02 MIL/uL (ref 3.70–5.45)
RDW: 17.1 % — ABNORMAL HIGH (ref 11.2–16.1)
WBC: 18.2 10*3/uL — AB (ref 3.9–10.3)

## 2017-10-23 LAB — COMPREHENSIVE METABOLIC PANEL
ALT: 15 U/L (ref 0–55)
ANION GAP: 11 (ref 3–11)
AST: 11 U/L (ref 5–34)
Albumin: 3.7 g/dL (ref 3.5–5.0)
Alkaline Phosphatase: 69 U/L (ref 40–150)
BUN: 13 mg/dL (ref 7–26)
CO2: 24 mmol/L (ref 22–29)
Calcium: 9.4 mg/dL (ref 8.4–10.4)
Chloride: 105 mmol/L (ref 98–109)
Creatinine, Ser: 0.85 mg/dL (ref 0.60–1.10)
Glucose, Bld: 94 mg/dL (ref 70–140)
POTASSIUM: 3.9 mmol/L (ref 3.3–4.7)
Sodium: 140 mmol/L (ref 136–145)
TOTAL PROTEIN: 6.6 g/dL (ref 6.4–8.3)
Total Bilirubin: 0.7 mg/dL (ref 0.2–1.2)

## 2017-10-23 LAB — PHOSPHORUS: PHOSPHORUS: 3.4 mg/dL (ref 2.5–4.6)

## 2017-10-23 LAB — LACTATE DEHYDROGENASE: LDH: 198 U/L (ref 125–245)

## 2017-10-23 MED ORDER — HEPARIN SOD (PORK) LOCK FLUSH 100 UNIT/ML IV SOLN
250.0000 [IU] | Freq: Once | INTRAVENOUS | Status: AC
  Administered 2017-10-23: 250 [IU] via INTRAVENOUS
  Filled 2017-10-23: qty 5

## 2017-10-23 MED ORDER — SODIUM CHLORIDE 0.9 % IJ SOLN
3.0000 mL | Freq: Once | INTRAMUSCULAR | Status: AC
  Administered 2017-10-23: 3 mL via INTRAVENOUS
  Filled 2017-10-23: qty 10

## 2017-10-23 MED ORDER — SODIUM CHLORIDE 0.9 % IJ SOLN
3.0000 mL | Freq: Once | INTRAMUSCULAR | Status: DC
  Filled 2017-10-23: qty 10

## 2017-10-23 MED ORDER — SODIUM CHLORIDE 0.9 % IJ SOLN
10.0000 mL | Freq: Once | INTRAMUSCULAR | Status: AC
  Administered 2017-10-23: 3 mL via INTRAVENOUS
  Filled 2017-10-23: qty 10

## 2017-10-23 MED ORDER — HEPARIN SOD (PORK) LOCK FLUSH 100 UNIT/ML IV SOLN
250.0000 [IU] | Freq: Once | INTRAVENOUS | Status: DC
  Filled 2017-10-23: qty 5

## 2017-10-23 NOTE — Assessment & Plan Note (Addendum)
She has amazing response to treatment She tolerated venetoclax at 200 mg daily without any signs of tumor lysis syndrome I will continue the same I recommend prednisone taper to 5 mg daily She will come back again next week for blood work and PICC line dressing changes I plan to see her back in 2 weeks Once we get her blood count normalized, I will add rituximab and repeat imaging study

## 2017-10-23 NOTE — Progress Notes (Signed)
[facility] OFFICE PROGRESS NOTE  Patient Care Team: Janith Lima, MD as PCP - General (Internal Medicine)  SUMMARY OF ONCOLOGIC HISTORY:   CLL (chronic lymphocytic leukemia) ([place])   04/05/2015 Pathology Results    Accession: [id number] flow cytometry confirmed CLL. FISH was positive for p53 mutation      04/24/2015 Imaging    Extensive lymphadenopathy throughout the neck, chest (axilla), abdomen and pelvis, as detailed above, compatible with the reported clinical history of lymphoma. 2. Mild splenomegaly.      05/03/2015 - 08/27/2015 Chemotherapy    She started on Ibrutinib, discontinued prematurely when her prescription ran out      10/10/2015 - 05/29/2017 Chemotherapy    She was restarted back on Ibrutinib      11/13/2016 PET scan    Significant generalized reduction in size of numerous lymph nodes in the neck, chest, abdomen, and pelvis. Previously the activity of these nodes was low-level and in general a similar low-level activity is present today, significantly less than mediastinal blood pool activity, compatible with Deauville 2. 2. Coronary atherosclerosis. Mild cardiomegaly. 3. Mildly prominent endometrium for age without accentuated metabolic activity in the endometrium. Consider pelvic sonography for further characterization.      05/27/2017 PET scan    1. Progressive hypermetabolic adenopathy, primarily involving cervical, axillary, pelvic and inguinal lymph nodes bilaterally, consistent with progressive lymphoma. 2. No solid visceral organ or osseous involvement.      06/16/2017 -  Chemotherapy    The patient had palonosetron (ALOXI) injection 0.25 mg, 0.25 mg, Intravenous,  Once, 1 of 4 cycles  riTUXimab (RITUXAN) 200 mg in sodium chloride 0.9 % 250 mL (0.7407 mg/mL) chemo infusion, 100 mg/m2 = 200 mg (100 % of original dose 100 mg/m2), Intravenous,  Once, 1 of 4 cycles Dose modification: 100 mg/m2 (original dose 100 mg/m2, Cycle 1, Reason: Provider  Judgment, Comment: anticipate high risk infusion)  bendamustine (BENDEKA) 175 mg in sodium chloride 0.9 % 50 mL (3.0702 mg/mL) chemo infusion, 90 mg/m2 = 175 mg, Intravenous,  Once, 1 of 4 cycles  for chemotherapy treatment.  She received Rituximab and Bendamustine      07/15/2017 - 07/19/2017 Hospital Admission    The patient was briefly admitted to the hospital due to infusion reaction to rituximab      09/18/2017 PET scan    1. Continued considerable adenopathy in the neck, chest, abdomen, and pelvis. This is generally stable in size but mildly reduced in activity compared to the prior exam. Current levels of activity primarily Deauville 3 and Deauville 4. No splenomegaly. 2. Diffuse new ground-glass opacities in the lungs with associated hypermetabolic activity. Some forms of lymphoma infiltration can rarely cause this pattern of diffuse ground-glass opacity and hypermetabolic activity. Differential diagnostic considerations might include atypical pneumonia such as mycoplasma, acute hypersensitivity pneumonitis, or acute eosinophilic pneumonia. Pulmonary hemorrhage seems less likely to cause this degree of accentuated metabolic activity.  3. Aortic Atherosclerosis (ICD10-I70.0). Coronary atherosclerosis.      09/24/2017 -  Chemotherapy    The patient had ventoclax for chemotherapy treatment.         INTERVAL HISTORY: Please see below for problem oriented charting. She returns for further follow-up She is able to wean herself off oxygen completely She is no longer feeling short of breath No new lymphadenopathy Denies recent chest pain, chest tightness or leg swelling Her appetite is stable, no recent weight loss  REVIEW OF SYSTEMS:   Constitutional: Denies fevers, chills or abnormal weight  loss Eyes: Denies blurriness of vision Ears, nose, mouth, throat, and face: Denies mucositis or sore throat Respiratory: Denies cough, dyspnea or wheezes Cardiovascular: Denies  palpitation, chest discomfort or lower extremity swelling Gastrointestinal:  Denies nausea, heartburn or change in bowel habits Skin: Denies abnormal skin rashes Lymphatics: Denies new lymphadenopathy or easy bruising Neurological:Denies numbness, tingling or new weaknesses Behavioral/Psych: Mood is stable, no new changes  All other systems were reviewed with the patient and are negative.  I have reviewed the past medical history, past surgical history, social history and family history with the patient and they are unchanged from previous note.  ALLERGIES:  has No Known Allergies.  MEDICATIONS:  Current Outpatient Medications  Medication Sig Dispense Refill  . acyclovir (ZOVIRAX) 400 MG tablet TAKE 1 TABLET BY MOUTH EVERY DAY (Patient taking differently: Take 400 mg by mouth once a day) 30 tablet 3  . allopurinol (ZYLOPRIM) 300 MG tablet TAKE 1 TABLET BY MOUTH EVERY DAY 30 tablet 3  . Cholecalciferol (VITAMIN D-3) 1000 units CAPS Take 1,000 Units by mouth daily with breakfast.    . diltiazem (CARDIZEM CD) 240 MG 24 hr capsule Take 1 capsule (240 mg total) daily by mouth. 30 capsule 5  . diltiazem 2 % GEL Apply 1 application topically 3 (three) times daily. Use for 6 weeks (Patient taking differently: Apply 1 application topically See admin instructions. TWO TIMES A DAY TO RECTAL AREA) 30 g 1  . ELIQUIS 5 MG TABS tablet TAKE 1 TABLET BY MOUTH TWICE A DAY (Patient taking differently: Take 5 mg by mouth two times a day) 180 tablet 1  . feeding supplement, ENSURE ENLIVE, (ENSURE ENLIVE) LIQD Take 237 mLs by mouth 2 (two) times daily between meals. 237 mL 12  . furosemide (LASIX) 40 MG tablet TAKE 1 TABLET BY MOUTH EVERY DAY (Patient taking differently: Take 40 mg by mouth once a day) 90 tablet 3  . metoprolol tartrate (LOPRESSOR) 25 MG tablet Take 1.5 tablets (37.5 mg total) by mouth 2 (two) times daily. 90 tablet 3  . ondansetron (ZOFRAN) 8 MG tablet Take 1 tablet (8 mg total) by mouth every 8  (eight) hours as needed for refractory nausea / vomiting. 30 tablet 1  . oxyCODONE (OXY IR/ROXICODONE) 5 MG immediate release tablet Take 1 tablet (5 mg total) by mouth every 4 (four) hours as needed for severe pain. 30 tablet 0  . predniSONE (DELTASONE) 10 MG tablet TAKE 1 TABLET BY MOUTH DAILY WITH BREAKFAST (Patient taking differently: Take 10 mg by mouth once a day with breakfast) 30 tablet 0  . prochlorperazine (COMPAZINE) 10 MG tablet Take 10 mg by mouth every 6 (six) hours as needed for nausea or vomiting.    . sacubitril-valsartan (ENTRESTO) 24-26 MG Take 1 tablet by mouth 2 (two) times daily. 180 tablet 3  . spironolactone (ALDACTONE) 25 MG tablet Take 25 mg by mouth daily.   1  . Venetoclax (VENCLEXTA) 100 MG TABS Take 200-400 mg by mouth daily. Take as directed with food and water for 30 days. 120 tablet 0   No current facility-administered medications for this visit.     PHYSICAL EXAMINATION: ECOG PERFORMANCE STATUS: 1 - Symptomatic but completely ambulatory  Vitals:   10/23/17 0920  BP: 135/79  Pulse: 76  Resp: 18  Temp: 97.6 F (36.4 C)  SpO2: 100%   Filed Weights   10/23/17 0920  Weight: 182 lb 14.4 oz (83 kg)    GENERAL:alert, no distress and comfortable SKIN: skin  color, texture, turgor are normal, no rashes or significant lesions EYES: normal, Conjunctiva are pink and non-injected, sclera clear OROPHARYNX:no exudate, no erythema and lips, buccal mucosa, and tongue normal  NECK: supple, thyroid normal size, non-tender, without nodularity LYMPH:  no palpable lymphadenopathy in the cervical, axillary or inguinal.  Previously palpable lymphadenopathy has resolved LUNGS: clear to auscultation and percussion with normal breathing effort HEART: regular rate & rhythm and no murmurs and no lower extremity edema ABDOMEN:abdomen soft, non-tender and normal bowel sounds Musculoskeletal:no cyanosis of digits and no clubbing  NEURO: alert & oriented x 3 with fluent speech,  no focal motor/sensory deficits  LABORATORY DATA:  I have reviewed the data as listed    Component Value Date/Time   NA 140 10/23/2017 0854   NA 140 10/09/2017 1416   K 3.9 10/23/2017 0854   K 4.4 10/09/2017 1416   CL 105 10/23/2017 0854   CL 105 01/18/2013 0912   CO2 24 10/23/2017 0854   CO2 25 10/09/2017 1416   GLUCOSE 94 10/23/2017 0854   GLUCOSE 117 10/09/2017 1416   GLUCOSE 83 01/18/2013 0912   BUN 13 10/23/2017 0854   BUN 11.1 10/09/2017 1416   CREATININE 0.85 10/23/2017 0854   CREATININE 0.8 10/09/2017 1416   CALCIUM 9.4 10/23/2017 0854   CALCIUM 9.6 10/09/2017 1416   PROT 6.6 10/23/2017 0854   PROT 6.4 10/09/2017 1416   ALBUMIN 3.7 10/23/2017 0854   ALBUMIN 3.4 (L) 10/09/2017 1416   AST 11 10/23/2017 0854   AST 18 10/09/2017 1416   ALT 15 10/23/2017 0854   ALT 19 10/09/2017 1416   ALKPHOS 69 10/23/2017 0854   ALKPHOS 69 10/09/2017 1416   BILITOT 0.7 10/23/2017 0854   BILITOT 0.57 10/09/2017 1416   GFRNONAA >60 10/23/2017 0854   GFRAA >60 10/23/2017 0854    No results found for: SPEP, UPEP  Lab Results  Component Value Date   WBC 18.2 (H) 10/23/2017   NEUTROABS 3.1 10/23/2017   HGB 11.3 (L) 10/23/2017   HCT 35.3 10/23/2017   MCV 87.6 10/23/2017   PLT 278 10/23/2017      Chemistry      Component Value Date/Time   NA 140 10/23/2017 0854   NA 140 10/09/2017 1416   K 3.9 10/23/2017 0854   K 4.4 10/09/2017 1416   CL 105 10/23/2017 0854   CL 105 01/18/2013 0912   CO2 24 10/23/2017 0854   CO2 25 10/09/2017 1416   BUN 13 10/23/2017 0854   BUN 11.1 10/09/2017 1416   CREATININE 0.85 10/23/2017 0854   CREATININE 0.8 10/09/2017 1416      Component Value Date/Time   CALCIUM 9.4 10/23/2017 0854   CALCIUM 9.6 10/09/2017 1416   ALKPHOS 69 10/23/2017 0854   ALKPHOS 69 10/09/2017 1416   AST 11 10/23/2017 0854   AST 18 10/09/2017 1416   ALT 15 10/23/2017 0854   ALT 19 10/09/2017 1416   BILITOT 0.7 10/23/2017 0854   BILITOT 0.57 10/09/2017 1416        RADIOGRAPHIC STUDIES: I have personally reviewed the radiological images as listed and agreed with the findings in the report. Dg Chest 2 View  Result Date: 10/02/2017 CLINICAL DATA:  Sob; f/u CHF. History of CLL and atrial fibrillation. EXAM: CHEST  2 VIEW COMPARISON:  09/22/2017 FINDINGS: Heart is enlarged. Patient has new right sided PICC line, tip overlying the level of superior vena cava. There are patchy opacities within the lungs bilaterally, increased over prior study and  compatible with increased edema. Infectious infiltrate could have a similar appearance. IMPRESSION: Increased bilateral airspace filling opacities, likely representing increased edema. Electronically Signed   By: Nolon Nations M.D.   On: 10/02/2017 14:35   Dg Thoracic Spine W/swimmers  Result Date: 10/22/2017 CLINICAL DATA:  [place] 10 days ago pain and stiffness in the mid to low back EXAM: THORACIC SPINE - 3 VIEWS COMPARISON:  Chest x-ray of 10/02/2017 FINDINGS: The thoracic vertebrae are in normal alignment. Intervertebral disc spaces appear normal. No prominent paravertebral soft tissue is seen. There is degenerative change at the C6-7 level. Right central venous line tip overlies the mid SVC. IMPRESSION: 1. Negative thoracic spine.  No acute abnormality. 2. Degenerative disc disease at C6-7. Electronically Signed   By: Ivar Drape M.D.   On: 10/22/2017 16:22   Dg Lumbar Spine Complete  Result Date: 10/22/2017 CLINICAL DATA:  [place] 10 days ago with pain and stiffness in the mid to lower back EXAM: LUMBAR SPINE - COMPLETE 4+ VIEW COMPARISON:  PET-CT of 09/18/2017 FINDINGS: There is a mild anterolisthesis of L4 on L5 by 3 mm most likely degenerative in origin. Intervertebral disc spaces appear normal. However, there is a mild compression deformity of the anterior superior aspect of L1 vertebral body of approximately 10%. No retropulsion is seen. The SI joints appear normal. IMPRESSION: 1. Acute mild anterior superior  compression deformity of L1 of approximately 10%. No retropulsion. 2. Mild anterolisthesis of L4 on L5 by 3 mm. Electronically Signed   By: Ivar Drape M.D.   On: 10/22/2017 16:20    ASSESSMENT & PLAN:  CLL (chronic lymphocytic leukemia) ([place]) She has amazing response to treatment She tolerated venetoclax at 200 mg daily without any signs of tumor lysis syndrome I will continue the same I recommend prednisone taper to 5 mg daily She will come back again next week for blood work and PICC line dressing changes I plan to see her back in 2 weeks Once we get her blood count normalized, I will add rituximab and repeat imaging study  Nonischemic cardiomyopathy ([place]) She has no signs or symptoms of congestive heart failure She will continue medical management   No orders of the defined types were placed in this encounter.  All questions were answered. The patient knows to call the clinic with any problems, questions or concerns. No barriers to learning was detected. I spent 15 minutes counseling the patient face to face. The total time spent in the appointment was 20 minutes and more than 50% was on counseling and review of test results     Heath Lark, MD 10/23/2017 8:05 PM

## 2017-10-23 NOTE — Assessment & Plan Note (Signed)
She has no signs or symptoms of congestive heart failure She will continue medical management 

## 2017-10-23 NOTE — Telephone Encounter (Signed)
Gave patient AVs and calendar of upcoming January and February appointments.  °

## 2017-10-25 DIAGNOSIS — S32010A Wedge compression fracture of first lumbar vertebra, initial encounter for closed fracture: Secondary | ICD-10-CM | POA: Insufficient documentation

## 2017-10-28 ENCOUNTER — Other Ambulatory Visit: Payer: Self-pay | Admitting: Hematology and Oncology

## 2017-10-30 ENCOUNTER — Inpatient Hospital Stay: Payer: BLUE CROSS/BLUE SHIELD

## 2017-10-30 DIAGNOSIS — Z452 Encounter for adjustment and management of vascular access device: Secondary | ICD-10-CM

## 2017-10-30 DIAGNOSIS — I480 Paroxysmal atrial fibrillation: Secondary | ICD-10-CM | POA: Diagnosis not present

## 2017-10-30 DIAGNOSIS — C9110 Chronic lymphocytic leukemia of B-cell type not having achieved remission: Secondary | ICD-10-CM

## 2017-10-30 DIAGNOSIS — Z95828 Presence of other vascular implants and grafts: Secondary | ICD-10-CM

## 2017-10-30 DIAGNOSIS — I110 Hypertensive heart disease with heart failure: Secondary | ICD-10-CM | POA: Diagnosis not present

## 2017-10-30 DIAGNOSIS — Z7901 Long term (current) use of anticoagulants: Secondary | ICD-10-CM | POA: Diagnosis not present

## 2017-10-30 DIAGNOSIS — I700 Atherosclerosis of aorta: Secondary | ICD-10-CM | POA: Diagnosis not present

## 2017-10-30 DIAGNOSIS — J9601 Acute respiratory failure with hypoxia: Secondary | ICD-10-CM | POA: Diagnosis not present

## 2017-10-30 DIAGNOSIS — R918 Other nonspecific abnormal finding of lung field: Secondary | ICD-10-CM | POA: Diagnosis not present

## 2017-10-30 DIAGNOSIS — K4090 Unilateral inguinal hernia, without obstruction or gangrene, not specified as recurrent: Secondary | ICD-10-CM | POA: Diagnosis not present

## 2017-10-30 DIAGNOSIS — I509 Heart failure, unspecified: Secondary | ICD-10-CM | POA: Diagnosis not present

## 2017-10-30 DIAGNOSIS — I340 Nonrheumatic mitral (valve) insufficiency: Secondary | ICD-10-CM | POA: Diagnosis not present

## 2017-10-30 DIAGNOSIS — E049 Nontoxic goiter, unspecified: Secondary | ICD-10-CM | POA: Diagnosis not present

## 2017-10-30 DIAGNOSIS — I429 Cardiomyopathy, unspecified: Secondary | ICD-10-CM | POA: Diagnosis not present

## 2017-10-30 DIAGNOSIS — Z79899 Other long term (current) drug therapy: Secondary | ICD-10-CM | POA: Diagnosis not present

## 2017-10-30 DIAGNOSIS — Z9221 Personal history of antineoplastic chemotherapy: Secondary | ICD-10-CM | POA: Diagnosis not present

## 2017-10-30 LAB — CBC WITH DIFFERENTIAL/PLATELET
BASOS ABS: 0.1 10*3/uL (ref 0.0–0.1)
BASOS PCT: 1 %
EOS ABS: 0 10*3/uL (ref 0.0–0.5)
Eosinophils Relative: 0 %
HEMATOCRIT: 32.9 % — AB (ref 34.8–46.6)
HEMOGLOBIN: 10.6 g/dL — AB (ref 11.6–15.9)
Lymphocytes Relative: 82 %
Lymphs Abs: 9.1 10*3/uL — ABNORMAL HIGH (ref 0.9–3.3)
MCH: 28.2 pg (ref 25.1–34.0)
MCHC: 32.1 g/dL (ref 31.5–36.0)
MCV: 87.8 fL (ref 79.5–101.0)
Monocytes Absolute: 0.2 10*3/uL (ref 0.1–0.9)
Monocytes Relative: 2 %
NEUTROS ABS: 1.7 10*3/uL (ref 1.5–6.5)
NEUTROS PCT: 15 %
Platelets: 252 10*3/uL (ref 145–400)
RBC: 3.74 MIL/uL (ref 3.70–5.45)
RDW: 16.7 % — ABNORMAL HIGH (ref 11.2–16.1)
WBC: 11.1 10*3/uL — ABNORMAL HIGH (ref 3.9–10.3)

## 2017-10-30 LAB — COMPREHENSIVE METABOLIC PANEL
ALBUMIN: 3.6 g/dL (ref 3.5–5.0)
ALK PHOS: 66 U/L (ref 40–150)
ALT: 11 U/L (ref 0–55)
ANION GAP: 13 — AB (ref 3–11)
AST: 13 U/L (ref 5–34)
BILIRUBIN TOTAL: 0.8 mg/dL (ref 0.2–1.2)
BUN: 10 mg/dL (ref 7–26)
CALCIUM: 9 mg/dL (ref 8.4–10.4)
CO2: 21 mmol/L — AB (ref 22–29)
CREATININE: 0.8 mg/dL (ref 0.60–1.10)
Chloride: 105 mmol/L (ref 98–109)
GFR calc Af Amer: >60 mL/min (ref >60)
GFR calc non Af Amer: >60 mL/min (ref >60)
GLUCOSE: 123 mg/dL (ref 70–140)
Potassium: 3.5 mmol/L (ref 3.3–4.7)
SODIUM: 139 mmol/L (ref 136–145)
Total Protein: 6.1 g/dL — ABNORMAL LOW (ref 6.4–8.3)

## 2017-10-30 LAB — LACTATE DEHYDROGENASE: LDH: 248 U/L — AB (ref 125–245)

## 2017-10-30 LAB — PHOSPHORUS: Phosphorus: 2.6 mg/dL (ref 2.5–4.6)

## 2017-10-30 MED ORDER — SODIUM CHLORIDE 0.9% FLUSH
10.0000 mL | Freq: Once | INTRAVENOUS | Status: AC
  Administered 2017-10-30: 10 mL
  Filled 2017-10-30: qty 10

## 2017-10-30 MED ORDER — HEPARIN SOD (PORK) LOCK FLUSH 100 UNIT/ML IV SOLN
500.0000 [IU] | Freq: Once | INTRAVENOUS | Status: DC
  Filled 2017-10-30: qty 5

## 2017-10-30 MED ORDER — HEPARIN SOD (PORK) LOCK FLUSH 100 UNIT/ML IV SOLN
250.0000 [IU] | Freq: Once | INTRAVENOUS | Status: AC
  Administered 2017-10-30: 09:00:00
  Filled 2017-10-30: qty 5

## 2017-11-01 ENCOUNTER — Other Ambulatory Visit: Payer: Self-pay | Admitting: Hematology and Oncology

## 2017-11-01 DIAGNOSIS — J189 Pneumonia, unspecified organism: Secondary | ICD-10-CM | POA: Diagnosis not present

## 2017-11-06 ENCOUNTER — Encounter: Payer: Self-pay | Admitting: Hematology and Oncology

## 2017-11-06 ENCOUNTER — Inpatient Hospital Stay: Payer: BLUE CROSS/BLUE SHIELD | Attending: Hematology and Oncology | Admitting: Hematology and Oncology

## 2017-11-06 ENCOUNTER — Inpatient Hospital Stay: Payer: BLUE CROSS/BLUE SHIELD

## 2017-11-06 ENCOUNTER — Telehealth: Payer: Self-pay | Admitting: Hematology and Oncology

## 2017-11-06 VITALS — BP 144/81 | HR 66 | Temp 98.6°F | Resp 18 | Ht 67.0 in | Wt 187.3 lb

## 2017-11-06 DIAGNOSIS — Z95828 Presence of other vascular implants and grafts: Secondary | ICD-10-CM

## 2017-11-06 DIAGNOSIS — T451X5A Adverse effect of antineoplastic and immunosuppressive drugs, initial encounter: Secondary | ICD-10-CM | POA: Diagnosis not present

## 2017-11-06 DIAGNOSIS — I429 Cardiomyopathy, unspecified: Secondary | ICD-10-CM | POA: Diagnosis not present

## 2017-11-06 DIAGNOSIS — C9110 Chronic lymphocytic leukemia of B-cell type not having achieved remission: Secondary | ICD-10-CM | POA: Diagnosis not present

## 2017-11-06 DIAGNOSIS — Z9221 Personal history of antineoplastic chemotherapy: Secondary | ICD-10-CM | POA: Diagnosis not present

## 2017-11-06 DIAGNOSIS — Z79899 Other long term (current) drug therapy: Secondary | ICD-10-CM | POA: Diagnosis not present

## 2017-11-06 DIAGNOSIS — Z5111 Encounter for antineoplastic chemotherapy: Secondary | ICD-10-CM | POA: Insufficient documentation

## 2017-11-06 DIAGNOSIS — Z452 Encounter for adjustment and management of vascular access device: Secondary | ICD-10-CM

## 2017-11-06 DIAGNOSIS — E883 Tumor lysis syndrome: Secondary | ICD-10-CM | POA: Insufficient documentation

## 2017-11-06 DIAGNOSIS — D702 Other drug-induced agranulocytosis: Secondary | ICD-10-CM | POA: Diagnosis not present

## 2017-11-06 DIAGNOSIS — I428 Other cardiomyopathies: Secondary | ICD-10-CM

## 2017-11-06 LAB — COMPREHENSIVE METABOLIC PANEL
ALK PHOS: 63 U/L (ref 40–150)
ALT: 13 U/L (ref 0–55)
ANION GAP: 11 (ref 3–11)
AST: 14 U/L (ref 5–34)
Albumin: 3.8 g/dL (ref 3.5–5.0)
BUN: 12 mg/dL (ref 7–26)
CALCIUM: 9.3 mg/dL (ref 8.4–10.4)
CHLORIDE: 106 mmol/L (ref 98–109)
CO2: 24 mmol/L (ref 22–29)
Creatinine, Ser: 0.76 mg/dL (ref 0.60–1.10)
GFR calc Af Amer: >60 mL/min (ref >60)
GFR calc non Af Amer: >60 mL/min (ref >60)
GLUCOSE: 93 mg/dL (ref 70–140)
Potassium: 3.6 mmol/L (ref 3.5–5.1)
SODIUM: 141 mmol/L (ref 136–145)
Total Bilirubin: 0.8 mg/dL (ref 0.2–1.2)
Total Protein: 6.2 g/dL — ABNORMAL LOW (ref 6.4–8.3)

## 2017-11-06 LAB — CBC WITH DIFFERENTIAL/PLATELET
BASOS ABS: 0.1 10*3/uL (ref 0.0–0.1)
Basophils Relative: 1 %
EOS ABS: 0 10*3/uL (ref 0.0–0.5)
Eosinophils Relative: 0 %
HEMATOCRIT: 35.7 % (ref 34.8–46.6)
HEMOGLOBIN: 11.3 g/dL — AB (ref 11.6–15.9)
LYMPHS ABS: 8.3 10*3/uL — AB (ref 0.9–3.3)
LYMPHS PCT: 78 %
MCH: 28.2 pg (ref 25.1–34.0)
MCHC: 31.7 g/dL (ref 31.5–36.0)
MCV: 88.8 fL (ref 79.5–101.0)
MONOS PCT: 3 %
Monocytes Absolute: 0.3 10*3/uL (ref 0.1–0.9)
NEUTROS ABS: 1.9 10*3/uL (ref 1.5–6.5)
NEUTROS PCT: 18 %
Platelets: 252 10*3/uL (ref 145–400)
RBC: 4.02 MIL/uL (ref 3.70–5.45)
RDW: 16.4 % — ABNORMAL HIGH (ref 11.2–14.5)
WBC: 10.6 10*3/uL — ABNORMAL HIGH (ref 3.9–10.3)

## 2017-11-06 LAB — PHOSPHORUS: PHOSPHORUS: 3.5 mg/dL (ref 2.5–4.6)

## 2017-11-06 MED ORDER — SODIUM CHLORIDE 0.9% FLUSH
10.0000 mL | Freq: Once | INTRAVENOUS | Status: AC
  Administered 2017-11-06: 10 mL
  Filled 2017-11-06: qty 10

## 2017-11-06 MED ORDER — HEPARIN SOD (PORK) LOCK FLUSH 100 UNIT/ML IV SOLN
250.0000 [IU] | Freq: Once | INTRAVENOUS | Status: AC
  Administered 2017-11-06: 500 [IU]
  Filled 2017-11-06: qty 5

## 2017-11-06 NOTE — Assessment & Plan Note (Signed)
We discussed the risk and benefits of keeping her PICC line versus placement of port The patient is undecided

## 2017-11-06 NOTE — Telephone Encounter (Signed)
Gave patient AVS and calendar of upcoming February appointments.  °

## 2017-11-06 NOTE — Progress Notes (Signed)
DISCONTINUE OFF PATHWAY REGIMEN - Lymphoma and CLL   [id number]:Bendamustine + Rituximab (90/375) q28 Days:   A cycle is every 28 days:     Bendamustine      Rituximab   **Always confirm dose/schedule in your pharmacy ordering system**    REASON: Other Reason PRIOR TREATMENT: Off Pathway: Bendamustine + Rituximab (90/375) q28 Days TREATMENT RESPONSE: Partial Response (PR)  START ON PATHWAY REGIMEN - Lymphoma and CLL     Ramp-up cycle = 35 days:     Venetoclax      Venetoclax      Venetoclax      Venetoclax      Venetoclax    Cycle 1 through 24 = every 28 days:     Venetoclax      Rituximab      Rituximab   **Always confirm dose/schedule in your pharmacy ordering system**    Patient Characteristics: Chronic Lymphocytic Leukemia (CLL), Second Line, 17p del (+) Disease Type: Chronic Lymphocytic Leukemia (CLL) Disease Type: Not Applicable Disease Type: Not Applicable Line of therapy: Second Line RAI Stage: IV 17p Deletion Status: Positive Intent of Therapy: Non-Curative / Palliative Intent, Discussed with Patient

## 2017-11-06 NOTE — Progress Notes (Signed)
[place] OFFICE PROGRESS NOTE  Patient Care Team: Janith Lima, MD as PCP - General (Internal Medicine)  SUMMARY OF ONCOLOGIC HISTORY:   CLL (chronic lymphocytic leukemia) ([place])   04/05/2015 Pathology Results    Accession: [id number] flow cytometry confirmed CLL. FISH was positive for p53 mutation      04/24/2015 Imaging    Extensive lymphadenopathy throughout the neck, chest (axilla), abdomen and pelvis, as detailed above, compatible with the reported clinical history of lymphoma. 2. Mild splenomegaly.      05/03/2015 - 08/27/2015 Chemotherapy    She started on Ibrutinib, discontinued prematurely when her prescription ran out      10/10/2015 - 05/29/2017 Chemotherapy    She was restarted back on Ibrutinib      11/13/2016 PET scan    Significant generalized reduction in size of numerous lymph nodes in the neck, chest, abdomen, and pelvis. Previously the activity of these nodes was low-level and in general a similar low-level activity is present today, significantly less than mediastinal blood pool activity, compatible with Deauville 2. 2. Coronary atherosclerosis. Mild cardiomegaly. 3. Mildly prominent endometrium for age without accentuated metabolic activity in the endometrium. Consider pelvic sonography for further characterization.      05/27/2017 PET scan    1. Progressive hypermetabolic adenopathy, primarily involving cervical, axillary, pelvic and inguinal lymph nodes bilaterally, consistent with progressive lymphoma. 2. No solid visceral organ or osseous involvement.      06/16/2017 -  Chemotherapy    The patient had palonosetron (ALOXI) injection 0.25 mg, 0.25 mg, Intravenous,  Once, 1 of 4 cycles  riTUXimab (RITUXAN) 200 mg in sodium chloride 0.9 % 250 mL (0.7407 mg/mL) chemo infusion, 100 mg/m2 = 200 mg (100 % of original dose 100 mg/m2), Intravenous,  Once, 1 of 4 cycles Dose modification: 100 mg/m2 (original dose 100 mg/m2, Cycle 1, Reason: Provider  Judgment, Comment: anticipate high risk infusion)  bendamustine (BENDEKA) 175 mg in sodium chloride 0.9 % 50 mL (3.0702 mg/mL) chemo infusion, 90 mg/m2 = 175 mg, Intravenous,  Once, 1 of 4 cycles  for chemotherapy treatment.  She received Rituximab and Bendamustine      07/15/2017 - 07/19/2017 Hospital Admission    The patient was briefly admitted to the hospital due to infusion reaction to rituximab      09/18/2017 PET scan    1. Continued considerable adenopathy in the neck, chest, abdomen, and pelvis. This is generally stable in size but mildly reduced in activity compared to the prior exam. Current levels of activity primarily Deauville 3 and Deauville 4. No splenomegaly. 2. Diffuse new ground-glass opacities in the lungs with associated hypermetabolic activity. Some forms of lymphoma infiltration can rarely cause this pattern of diffuse ground-glass opacity and hypermetabolic activity. Differential diagnostic considerations might include atypical pneumonia such as mycoplasma, acute hypersensitivity pneumonitis, or acute eosinophilic pneumonia. Pulmonary hemorrhage seems less likely to cause this degree of accentuated metabolic activity.  3. Aortic Atherosclerosis (ICD10-I70.0). Coronary atherosclerosis.      09/24/2017 -  Chemotherapy    The patient had ventoclax for chemotherapy treatment.         INTERVAL HISTORY: Please see below for problem oriented charting. She returns with her husband for further follow-up She is doing well Denies recent cough, chest pain or shortness of breath She had fatigue on minimum exertion No recent infection No recent leg swelling.  REVIEW OF SYSTEMS:   Constitutional: Denies fevers, chills or abnormal weight loss Eyes: Denies blurriness of vision Ears, nose,  mouth, throat, and face: Denies mucositis or sore throat Respiratory: Denies cough, dyspnea or wheezes Cardiovascular: Denies palpitation, chest discomfort or lower extremity  swelling Gastrointestinal:  Denies nausea, heartburn or change in bowel habits Skin: Denies abnormal skin rashes Lymphatics: Denies new lymphadenopathy or easy bruising Neurological:Denies numbness, tingling or new weaknesses Behavioral/Psych: Mood is stable, no new changes  All other systems were reviewed with the patient and are negative.  I have reviewed the past medical history, past surgical history, social history and family history with the patient and they are unchanged from previous note.  ALLERGIES:  has No Known Allergies.  MEDICATIONS:  Current Outpatient Medications  Medication Sig Dispense Refill  . Cholecalciferol (VITAMIN D-3) 1000 units CAPS Take 1,000 Units by mouth daily with breakfast.    . diltiazem (CARDIZEM CD) 240 MG 24 hr capsule Take 1 capsule (240 mg total) daily by mouth. 30 capsule 5  . diltiazem 2 % GEL Apply 1 application topically 3 (three) times daily. Use for 6 weeks (Patient taking differently: Apply 1 application topically See admin instructions. TWO TIMES A DAY TO RECTAL AREA) 30 g 1  . ELIQUIS 5 MG TABS tablet TAKE 1 TABLET BY MOUTH TWICE A DAY (Patient taking differently: Take 5 mg by mouth two times a day) 180 tablet 1  . feeding supplement, ENSURE ENLIVE, (ENSURE ENLIVE) LIQD Take 237 mLs by mouth 2 (two) times daily between meals. 237 mL 12  . furosemide (LASIX) 40 MG tablet TAKE 1 TABLET BY MOUTH EVERY DAY (Patient taking differently: Take 40 mg by mouth once a day) 90 tablet 3  . metoprolol tartrate (LOPRESSOR) 25 MG tablet Take 1.5 tablets (37.5 mg total) by mouth 2 (two) times daily. 90 tablet 3  . oxyCODONE (OXY IR/ROXICODONE) 5 MG immediate release tablet Take 1 tablet (5 mg total) by mouth every 4 (four) hours as needed for severe pain. 30 tablet 0  . predniSONE (DELTASONE) 10 MG tablet TAKE 1 TABLET BY MOUTH DAILY WITH BREAKFAST (Patient taking differently: Take 10 mg by mouth once a day with breakfast) 30 tablet 0  . prochlorperazine  (COMPAZINE) 10 MG tablet Take 10 mg by mouth every 6 (six) hours as needed for nausea or vomiting.    . sacubitril-valsartan (ENTRESTO) 24-26 MG Take 1 tablet by mouth 2 (two) times daily. 180 tablet 3  . spironolactone (ALDACTONE) 25 MG tablet Take 25 mg by mouth daily.   1  . Venetoclax (VENCLEXTA) 100 MG TABS Take 200-400 mg by mouth daily. Take as directed with food and water for 30 days. 120 tablet 0   No current facility-administered medications for this visit.     PHYSICAL EXAMINATION: ECOG PERFORMANCE STATUS: 1 - Symptomatic but completely ambulatory  Vitals:   11/06/17 1046  BP: (!) 144/81  Pulse: 66  Resp: 18  Temp: 98.6 F (37 C)  SpO2: 100%   Filed Weights   11/06/17 1046  Weight: 187 lb 4.8 oz (85 kg)    GENERAL:alert, no distress and comfortable.  She looks mildly cushingoid SKIN: skin color, texture, turgor are normal, no rashes or significant lesions EYES: normal, Conjunctiva are pink and non-injected, sclera clear OROPHARYNX:no exudate, no erythema and lips, buccal mucosa, and tongue normal  NECK: supple, thyroid normal size, non-tender, without nodularity LYMPH:  no palpable lymphadenopathy in the cervical, axillary or inguinal LUNGS: clear to auscultation and percussion with normal breathing effort HEART: regular rate & rhythm and no murmurs and no lower extremity edema ABDOMEN:abdomen soft, non-tender  and normal bowel sounds Musculoskeletal:no cyanosis of digits and no clubbing  NEURO: alert & oriented x 3 with fluent speech, no focal motor/sensory deficits  LABORATORY DATA:  I have reviewed the data as listed    Component Value Date/Time   NA 141 11/06/2017 0928   NA 140 10/09/2017 1416   K 3.6 11/06/2017 0928   K 4.4 10/09/2017 1416   CL 106 11/06/2017 0928   CL 105 01/18/2013 0912   CO2 24 11/06/2017 0928   CO2 25 10/09/2017 1416   GLUCOSE 93 11/06/2017 0928   GLUCOSE 117 10/09/2017 1416   GLUCOSE 83 01/18/2013 0912   BUN 12 11/06/2017 0928    BUN 11.1 10/09/2017 1416   CREATININE 0.76 11/06/2017 0928   CREATININE 0.8 10/09/2017 1416   CALCIUM 9.3 11/06/2017 0928   CALCIUM 9.6 10/09/2017 1416   PROT 6.2 (L) 11/06/2017 0928   PROT 6.4 10/09/2017 1416   ALBUMIN 3.8 11/06/2017 0928   ALBUMIN 3.4 (L) 10/09/2017 1416   AST 14 11/06/2017 0928   AST 18 10/09/2017 1416   ALT 13 11/06/2017 0928   ALT 19 10/09/2017 1416   ALKPHOS 63 11/06/2017 0928   ALKPHOS 69 10/09/2017 1416   BILITOT 0.8 11/06/2017 0928   BILITOT 0.57 10/09/2017 1416   GFRNONAA >60 11/06/2017 0928   GFRAA >60 11/06/2017 0928    No results found for: SPEP, UPEP  Lab Results  Component Value Date   WBC 10.6 (H) 11/06/2017   NEUTROABS 1.9 11/06/2017   HGB 11.3 (L) 11/06/2017   HCT 35.7 11/06/2017   MCV 88.8 11/06/2017   PLT 252 11/06/2017      Chemistry      Component Value Date/Time   NA 141 11/06/2017 0928   NA 140 10/09/2017 1416   K 3.6 11/06/2017 0928   K 4.4 10/09/2017 1416   CL 106 11/06/2017 0928   CL 105 01/18/2013 0912   CO2 24 11/06/2017 0928   CO2 25 10/09/2017 1416   BUN 12 11/06/2017 0928   BUN 11.1 10/09/2017 1416   CREATININE 0.76 11/06/2017 0928   CREATININE 0.8 10/09/2017 1416      Component Value Date/Time   CALCIUM 9.3 11/06/2017 0928   CALCIUM 9.6 10/09/2017 1416   ALKPHOS 63 11/06/2017 0928   ALKPHOS 69 10/09/2017 1416   AST 14 11/06/2017 0928   AST 18 10/09/2017 1416   ALT 13 11/06/2017 0928   ALT 19 10/09/2017 1416   BILITOT 0.8 11/06/2017 0928   BILITOT 0.57 10/09/2017 1416       RADIOGRAPHIC STUDIES: I have personally reviewed the radiological images as listed and agreed with the findings in the report. Dg Thoracic Spine W/swimmers  Result Date: 10/22/2017 CLINICAL DATA:  [place] 10 days ago pain and stiffness in the mid to low back EXAM: THORACIC SPINE - 3 VIEWS COMPARISON:  Chest x-ray of 10/02/2017 FINDINGS: The thoracic vertebrae are in normal alignment. Intervertebral disc spaces appear normal. No  prominent paravertebral soft tissue is seen. There is degenerative change at the C6-7 level. Right central venous line tip overlies the mid SVC. IMPRESSION: 1. Negative thoracic spine.  No acute abnormality. 2. Degenerative disc disease at C6-7. Electronically Signed   By: Ivar Drape M.D.   On: 10/22/2017 16:22   Dg Lumbar Spine Complete  Result Date: 10/22/2017 CLINICAL DATA:  [place] 10 days ago with pain and stiffness in the mid to lower back EXAM: LUMBAR SPINE - COMPLETE 4+ VIEW COMPARISON:  PET-CT of 09/18/2017  FINDINGS: There is a mild anterolisthesis of L4 on L5 by 3 mm most likely degenerative in origin. Intervertebral disc spaces appear normal. However, there is a mild compression deformity of the anterior superior aspect of L1 vertebral body of approximately 10%. No retropulsion is seen. The SI joints appear normal. IMPRESSION: 1. Acute mild anterior superior compression deformity of L1 of approximately 10%. No retropulsion. 2. Mild anterolisthesis of L4 on L5 by 3 mm. Electronically Signed   By: Ivar Drape M.D.   On: 10/22/2017 16:20    ASSESSMENT & PLAN:  CLL (chronic lymphocytic leukemia) ([place]) She has excellent response to treatment with no signs of tumor lysis syndrome As shared with her publication from [place] [facility] combining but Ventoclax with rituximab We discussed the risk, benefits, side effects of additional treatment and she agreed to proceed I plan to order a PET CT scan next week to assess response to therapy I will tentatively schedule her to start treatment with rituximab on 2/12. Given prior infusion reaction, I plan to cap her infusion dose at 375 mg/m square She will continue low-dose prednisone at 5 mg daily and I plan to taper her off in her next visit  Nonischemic cardiomyopathy ([place]) She has no signs or symptoms of congestive heart failure She will continue medical management  PICC (peripherally inserted central catheter) in place We discussed  the risk and benefits of keeping her PICC line versus placement of port The patient is undecided   Orders Placed This Encounter  Procedures  . NM PET Image Restag (PS) Skull Base To Thigh    Standing Status:   Future    Standing Expiration Date:   11/06/2018    Order Specific Question:   If indicated for the ordered procedure, I authorize the administration of a radiopharmaceutical per Radiology protocol    Answer:   Yes    Order Specific Question:   Preferred imaging location?    Answer:   [facility]    Order Specific Question:   Radiology Contrast Protocol - do NOT remove file path    Answer:   [email or web address]    Order Specific Question:   Is the patient pregnant?    Answer:   No  . Hepatitis B core antibody, IgM    Standing Status:   Future    Standing Expiration Date:   12/11/2018  . Hepatitis B surface antibody    Standing Status:   Future    Standing Expiration Date:   12/11/2018  . Hepatitis B surface antigen    Standing Status:   Future    Standing Expiration Date:   12/11/2018  . Comprehensive metabolic panel    Standing Status:   Standing    Number of Occurrences:   22    Standing Expiration Date:   11/06/2018  . CBC with Differential/Platelet    Standing Status:   Standing    Number of Occurrences:   22    Standing Expiration Date:   11/06/2018  . Lactate dehydrogenase    Standing Status:   Standing    Number of Occurrences:   9    Standing Expiration Date:   11/06/2018  . Uric acid    Standing Status:   Standing    Number of Occurrences:   9    Standing Expiration Date:   11/06/2018   All questions were answered. The patient knows to call the clinic with any problems, questions or concerns. No barriers to learning  was detected. I spent 25 minutes counseling the patient face to face. The total time spent in the appointment was 40 minutes and more than 50% was on counseling and review of test results     Heath Lark, MD 11/06/2017 11:25  AM

## 2017-11-06 NOTE — Assessment & Plan Note (Addendum)
She has excellent response to treatment with no signs of tumor lysis syndrome As shared with her publication from [place] [facility] combining but Ventoclax with rituximab We discussed the risk, benefits, side effects of additional treatment and she agreed to proceed I plan to order a PET CT scan next week to assess response to therapy I will tentatively schedule her to start treatment with rituximab on 2/12. Given prior infusion reaction, I plan to cap her infusion dose at 375 mg/m square She will continue low-dose prednisone at 5 mg daily and I plan to taper her off in her next visit

## 2017-11-06 NOTE — Assessment & Plan Note (Signed)
She has no signs or symptoms of congestive heart failure She will continue medical management 

## 2017-11-09 ENCOUNTER — Other Ambulatory Visit: Payer: Self-pay | Admitting: Pharmacist

## 2017-11-10 NOTE — Progress Notes (Signed)
Electrophysiology Office Note   Date:  11/11/2017   ID:  Audrey Gross, DOB 1961-04-13, MRN [id number]  PCP:  Janith Lima, MD  Cardiologist:  [place] Primary Electrophysiologist:  Christabella Alvira Meredith Leeds, MD    Chief Complaint  Patient presents with  . Follow-up    Persistent Afib     History of Present Illness: Audrey Gross is a 57 y.o. female who is being seen today for the evaluation of atrial fibrillaiton at the request of Janith Lima, MD. Presenting today for electrophysiology evaluation. She has a history of CLL, asymptomatic left ventricular dysfunction, and paroxysmal atrial fibrillation possibly related to a pulmonary vein trigger. She had a cardioversion for persistent atrial fibrillation on 04/02/17 with no clinical recurrence. Echocardiogram done September 2018 showed an EF of 30-35% consistent with an echo in May 2018. Left atrium is moderately dilated at 45 mm.  Today, denies symptoms of palpitations, chest pain, shortness of breath, orthopnea, PND, lower extremity edema, claudication, dizziness, presyncope, syncope, bleeding, or neurologic sequela. The patient is tolerating medications without difficulties.  Over the last few months, she has been having issues with her CLL.  She is currently on chemo.  She is required multiple hospitalizations.  At one point she was on oxygen.  She has done better over the last few weeks.  She has not noted further palpitations.  She does not feel that she has been in atrial fibrillation.  Past Medical History:  Diagnosis Date  . Atrial fibrillation ([place])   . CLL (chronic lymphoblastic leukemia)    Leukemia  . Cystic breast   . Depression   . Diffuse cystic mastopathy   . Dyslipidemia (high LDL; low HDL)   . Hypertension   . Iron deficiency anemia, unspecified   . Lymphadenopathy of head and neck 04/05/2015  . Medical non-compliance   . Mitral regurgitation 02/2017   moderate to severe  . Persistent atrial fibrillation with  rapid ventricular response ([place]) 07/15/2017   Past Surgical History:  Procedure Laterality Date  . CARDIOVERSION N/A 04/02/2017   Procedure: CARDIOVERSION;  Surgeon: Dorothy Spark, MD;  Location[facility] ENDOSCOPY;  Service: Cardiovascular;  Laterality: N/A;  . CARDIOVERSION N/A 08/19/2017   Procedure: CARDIOVERSION;  Surgeon: Larey Dresser, MD;  Location: [facility] ENDOSCOPY;  Service: Cardiovascular;  Laterality: N/A;  . TUBAL LIGATION       Current Outpatient Medications  Medication Sig Dispense Refill  . Cholecalciferol (VITAMIN D-3) 1000 units CAPS Take 1,000 Units by mouth daily with breakfast.    . diltiazem 2 % GEL Apply 1 application topically 3 (three) times daily. Use for 6 weeks (Patient taking differently: Apply 1 application topically See admin instructions. TWO TIMES A DAY TO RECTAL AREA) 30 g 1  . ELIQUIS 5 MG TABS tablet TAKE 1 TABLET BY MOUTH TWICE A DAY (Patient taking differently: Take 5 mg by mouth two times a day) 180 tablet 1  . furosemide (LASIX) 40 MG tablet TAKE 1 TABLET BY MOUTH EVERY DAY (Patient taking differently: Take 40 mg by mouth once a day) 90 tablet 3  . oxyCODONE (OXY IR/ROXICODONE) 5 MG immediate release tablet Take 1 tablet (5 mg total) by mouth every 4 (four) hours as needed for severe pain. 30 tablet 0  . predniSONE (DELTASONE) 10 MG tablet Take 5 mg by mouth daily with breakfast.    . sacubitril-valsartan (ENTRESTO) 24-26 MG Take 1 tablet by mouth 2 (two) times daily. 180 tablet 3  . Venetoclax (VENCLEXTA)  100 MG TABS Take 200-400 mg by mouth daily. Take as directed with food and water for 30 days. 120 tablet 0  . metoprolol succinate (TOPROL-XL) 100 MG 24 hr tablet Take 1 tablet (100 mg total) by mouth daily. Take with or immediately following a meal. Take with 50 mg tablet to total 150 mg daily. 30 tablet 6  . metoprolol succinate (TOPROL-XL) 50 MG 24 hr tablet Take 1 tablet (50 mg total) by mouth daily. Take with or immediately following a meal. Take  with 100 mg tablet to total 150 mg daily. 30 tablet 6   No current facility-administered medications for this visit.     Allergies:   Patient has no known allergies.   Social History:  The patient  reports that  has never smoked. she has never used smokeless tobacco. She reports that she does not drink alcohol or use drugs.   Family History:  The patient's family history includes Heart disease in her brother and maternal grandmother; Kidney disease in her father; Stomach cancer in her maternal aunt and paternal grandmother; Sudden death in her mother.    ROS:  Please see the history of present illness.   Otherwise, review of systems is positive for fatigue, cough, dyspnea on exertion, bloody stools, diarrhea.   All other systems are reviewed and negative.   PHYSICAL EXAM: VS:  BP 112/78   Pulse 62   Ht 5\' 7"  (1.702 m)   Wt 188 lb (85.3 kg)   BMI 29.44 kg/m  , BMI Body mass index is 29.44 kg/m. GEN: Well nourished, well developed, in no acute distress  HEENT: normal  Neck: no JVD, carotid bruits, or masses Cardiac: RRR; no murmurs, rubs, or gallops,no edema  Respiratory:  clear to auscultation bilaterally, normal work of breathing GI: soft, nontender, nondistended, + BS MS: no deformity or atrophy  Skin: warm and dry Neuro:  Strength and sensation are intact Psych: euthymic mood, full affect  EKG:  EKG is not ordered today. Personal review of the ekg ordered 09/23/17 shows sinus tachycardia, rate 116, voltage criteria for LVH  Recent Labs: 07/18/2017: Magnesium 2.0 09/22/2017: TSH 0.05 09/26/2017: B Natriuretic Peptide 72.1 11/06/2017: ALT 13; BUN 12; Creatinine, Ser 0.76; Hemoglobin 11.3; Platelets 252; Potassium 3.6; Sodium 141    Lipid Panel     Component Value Date/Time   CHOL 170 07/16/2017 0103   TRIG 100 07/16/2017 0103   HDL 31 (L) 07/16/2017 0103   CHOLHDL 5.5 07/16/2017 0103   VLDL 20 07/16/2017 0103   LDLCALC 119 (H) 07/16/2017 0103   LDLDIRECT 144.5  07/26/2013 0934     Wt Readings from Last 3 Encounters:  11/11/17 188 lb (85.3 kg)  11/06/17 187 lb 4.8 oz (85 kg)  10/23/17 182 lb 14.4 oz (83 kg)      Other studies Reviewed: Additional studies/ records that were reviewed today include: TTE 06/19/17  Review of the above records today demonstrates:  - Left ventricle: LVEF is depressed at approxiamately 35% with   severe hypokinesis of tine inferior and septal walls The cavity   size was normal. Wall thickness was increased in a pattern of   mild LVH. Systolic function was moderately to severely reduced.   The estimated ejection fraction was in the range of 30% to 35%. - Mitral valve: There was moderate regurgitation. - Left atrium: The atrium was moderately dilated. - Right ventricle: Systolic function was mildly to moderately   reduced.   ASSESSMENT AND PLAN:  1.  Persistent atrial fibrillation: He on Eliquis for anticoagulation.  She was previously cardioverted and appears that she has remained in sinus rhythm.  She has no signs of atrial fibrillation and is regular on exam today.  She is not interested in antiarrhythmics or ablation at this time.  This patients CHA2DS2-VASc Score and unadjusted Ischemic Stroke Rate (% per year) is equal to 3.2 % stroke rate/year from a score of 3  Above score calculated as 1 point each if present [CHF, HTN, DM, Vascular=MI/PAD/Aortic Plaque, Age if 65-74, or Female] Above score calculated as 2 points each if present [Age > 75, or Stroke/TIA/TE]  2. Essential hypertension: Pressure currently well controlled.  3. Chronic systolic heart failure: Ejection fraction 30-35%.  Currently on Entresto and metoprolol and diltiazem.  Meegan Shanafelt adjust her medications by stopping metoprolol and diltiazem and start her on Toprol-XL 150 mg.  We Eliz Nigg get a repeat echocardiogram in 6 months to further assess her ejection fraction to see if she Seng Larch need a defibrillator.  4. Hyperlipidemia: Managed with diet and  exercise  Current medicines are reviewed at length with the patient today.   The patient does not have concerns regarding her medicines.  The following changes were made today: Stop diltiazem, stop metoprolol, start Toprol-XL  Labs/ tests ordered today include:  Orders Placed This Encounter  Procedures  . ECHOCARDIOGRAM COMPLETE     Disposition:   FU with Marti Mclane 6 months  Signed, Keon Pender Meredith Leeds, MD  11/11/2017 10:14 AM     [facility] [address] [place] [address] [phone] (office) [phone] (fax)

## 2017-11-11 ENCOUNTER — Telehealth: Payer: Self-pay

## 2017-11-11 ENCOUNTER — Encounter: Payer: Self-pay | Admitting: Cardiology

## 2017-11-11 ENCOUNTER — Ambulatory Visit (INDEPENDENT_AMBULATORY_CARE_PROVIDER_SITE_OTHER): Payer: BLUE CROSS/BLUE SHIELD | Admitting: Cardiology

## 2017-11-11 VITALS — BP 112/78 | HR 62 | Ht 67.0 in | Wt 188.0 lb

## 2017-11-11 DIAGNOSIS — I481 Persistent atrial fibrillation: Secondary | ICD-10-CM | POA: Diagnosis not present

## 2017-11-11 DIAGNOSIS — I428 Other cardiomyopathies: Secondary | ICD-10-CM

## 2017-11-11 DIAGNOSIS — I10 Essential (primary) hypertension: Secondary | ICD-10-CM | POA: Diagnosis not present

## 2017-11-11 DIAGNOSIS — E785 Hyperlipidemia, unspecified: Secondary | ICD-10-CM | POA: Diagnosis not present

## 2017-11-11 DIAGNOSIS — I4819 Other persistent atrial fibrillation: Secondary | ICD-10-CM

## 2017-11-11 MED ORDER — METOPROLOL SUCCINATE ER 100 MG PO TB24: 100.0000 mg | ORAL_TABLET | Freq: Every day | ORAL | 6 refills | Status: DC

## 2017-11-11 MED ORDER — METOPROLOL SUCCINATE ER 50 MG PO TB24: 50.0000 mg | ORAL_TABLET | Freq: Every day | ORAL | 6 refills | Status: DC

## 2017-11-11 NOTE — Telephone Encounter (Signed)
Spoke with pt by phone and informed her per Dr Alvy Bimler she can stop taking her allopurinol. Pt verbalizes understanding of instructions.

## 2017-11-11 NOTE — Patient Instructions (Addendum)
Medication Instructions:  Your physician has recommended you make the following change in your medication: 1. STOP Metoprolol Tartrate 2. STOP Diltiazem tablets 3. START Toprol 150 mg daily (you will take a 100 mg and 50 mg tablet to total 150 mg daily)  * If you need a refill on your cardiac medications before your next appointment, please call your pharmacy. *  Labwork: None ordered  Testing/Procedures: Your physician has requested that you have an echocardiogram in 6 months - before follow up appointment with Dr. Curt Bears. Echocardiography is a painless test that uses sound waves to create images of your heart. It provides your doctor with information about the size and shape of your heart and how well your heart's chambers and valves are working. This procedure takes approximately one hour. There are no restrictions for this procedure.  Follow-Up: Your physician wants you to follow-up in: 6 months with Dr. Curt Bears - after your echocardiogram has been completed.  You will receive a reminder letter in the mail two months in advance. If you don't receive a letter, please call our office to schedule the follow-up appointment.  Thank you for choosing [facility]!!   Trinidad Curet, RN [phone]    Metoprolol extended-release tablets What is this medicine? METOPROLOL (me TOE proe lole) is a beta-blocker. Beta-blockers reduce the workload on the heart and help it to beat more regularly. This medicine is used to treat high blood pressure and to prevent chest pain. It is also used to after a heart attack and to prevent an additional heart attack from occurring. This medicine may be used for other purposes; ask your health care provider or pharmacist if you have questions. COMMON BRAND NAME(S): toprol, Toprol XL What should I tell my health care provider before I take this medicine? They need to know if you have any of these conditions: -diabetes -heart or vessel disease like slow  heart rate, worsening heart failure, heart block, sick sinus syndrome or Raynaud's disease -kidney disease -liver disease -lung or breathing disease, like asthma or emphysema -pheochromocytoma -thyroid disease -an unusual or allergic reaction to metoprolol, other beta-blockers, medicines, foods, dyes, or preservatives -pregnant or trying to get pregnant -breast-feeding How should I use this medicine? Take this medicine by mouth with a glass of water. Follow the directions on the prescription label. Do not crush or chew. Take this medicine with or immediately after meals. Take your doses at regular intervals. Do not take more medicine than directed. Do not stop taking this medicine suddenly. This could lead to serious heart-related effects. Talk to your pediatrician regarding the use of this medicine in children. While this drug may be prescribed for children as young as 6 years for selected conditions, precautions do apply. Overdosage: If you think you have taken too much of this medicine contact a poison control center or emergency room at once. NOTE: This medicine is only for you. Do not share this medicine with others. What if I miss a dose? If you miss a dose, take it as soon as you can. If it is almost time for your next dose, take only that dose. Do not take double or extra doses. What may interact with this medicine? This medicine may interact with the following medications: -certain medicines for blood pressure, heart disease, irregular heart beat -certain medicines for depression, like monoamine oxidase (MAO) inhibitors, fluoxetine, or paroxetine -clonidine -dobutamine -epinephrine -isoproterenol -reserpine This list may not describe all possible interactions. Give your health care provider a list  of all the medicines, herbs, non-prescription drugs, or dietary supplements you use. Also tell them if you smoke, drink alcohol, or use illegal drugs. Some items may interact with your  medicine. What should I watch for while using this medicine? Visit your doctor or health care professional for regular check ups. Contact your doctor right away if your symptoms worsen. Check your blood pressure and pulse rate regularly. Ask your health care professional what your blood pressure and pulse rate should be, and when you should contact them. You may get drowsy or dizzy. Do not drive, use machinery, or do anything that needs mental alertness until you know how this medicine affects you. Do not sit or stand up quickly, especially if you are an older patient. This reduces the risk of dizzy or fainting spells. Contact your doctor if these symptoms continue. Alcohol may interfere with the effect of this medicine. Avoid alcoholic drinks. What side effects may I notice from receiving this medicine? Side effects that you should report to your doctor or health care professional as soon as possible: -allergic reactions like skin rash, itching or hives -cold or numb hands or feet -depression -difficulty breathing -faint -fever with sore throat -irregular heartbeat, chest pain -rapid weight gain -swollen legs or ankles Side effects that usually do not require medical attention (report to your doctor or health care professional if they continue or are bothersome): -anxiety or nervousness -change in sex drive or performance -dry skin -headache -nightmares or trouble sleeping -short term memory loss -stomach upset or diarrhea -unusually tired This list may not describe all possible side effects. Call your doctor for medical advice about side effects. You may report side effects to FDA at 1-800-FDA-1088. Where should I keep my medicine? Keep out of the reach of children. Store at room temperature between 15 and 30 degrees C (59 and 86 degrees F). Throw away any unused medicine after the expiration date. NOTE: This sheet is a summary. It may not cover all possible information. If you have  questions about this medicine, talk to your doctor, pharmacist, or health care provider.  2018 Elsevier/Gold Standard (2013-05-27 14:41:37)

## 2017-11-12 ENCOUNTER — Other Ambulatory Visit: Payer: Self-pay

## 2017-11-12 ENCOUNTER — Other Ambulatory Visit: Payer: Self-pay | Admitting: Hematology and Oncology

## 2017-11-12 ENCOUNTER — Other Ambulatory Visit: Payer: Self-pay | Admitting: Pharmacist

## 2017-11-12 ENCOUNTER — Telehealth: Payer: Self-pay | Admitting: Hematology and Oncology

## 2017-11-12 DIAGNOSIS — C9110 Chronic lymphocytic leukemia of B-cell type not having achieved remission: Secondary | ICD-10-CM

## 2017-11-12 MED ORDER — VENETOCLAX 100 MG PO TABS: 200.0000 mg | ORAL_TABLET | Freq: Every day | ORAL | 9 refills | Status: DC

## 2017-11-12 NOTE — Telephone Encounter (Signed)
11/12/17 @ 3:02 pm spoke with patient confirming Disability has been successfully faxed to [facility] @ [phone] @ 2:48 pm.  Patient requested to pick a copy up for personal records.

## 2017-11-13 ENCOUNTER — Telehealth: Payer: Self-pay | Admitting: *Deleted

## 2017-11-13 ENCOUNTER — Inpatient Hospital Stay: Payer: BLUE CROSS/BLUE SHIELD

## 2017-11-13 DIAGNOSIS — Z452 Encounter for adjustment and management of vascular access device: Secondary | ICD-10-CM

## 2017-11-13 DIAGNOSIS — I429 Cardiomyopathy, unspecified: Secondary | ICD-10-CM | POA: Diagnosis not present

## 2017-11-13 DIAGNOSIS — Z79899 Other long term (current) drug therapy: Secondary | ICD-10-CM | POA: Diagnosis not present

## 2017-11-13 DIAGNOSIS — C9110 Chronic lymphocytic leukemia of B-cell type not having achieved remission: Secondary | ICD-10-CM

## 2017-11-13 DIAGNOSIS — Z95828 Presence of other vascular implants and grafts: Secondary | ICD-10-CM

## 2017-11-13 DIAGNOSIS — E883 Tumor lysis syndrome: Secondary | ICD-10-CM | POA: Diagnosis not present

## 2017-11-13 DIAGNOSIS — T451X5A Adverse effect of antineoplastic and immunosuppressive drugs, initial encounter: Secondary | ICD-10-CM | POA: Diagnosis not present

## 2017-11-13 DIAGNOSIS — Z5111 Encounter for antineoplastic chemotherapy: Secondary | ICD-10-CM | POA: Diagnosis not present

## 2017-11-13 DIAGNOSIS — D702 Other drug-induced agranulocytosis: Secondary | ICD-10-CM | POA: Diagnosis not present

## 2017-11-13 DIAGNOSIS — Z9221 Personal history of antineoplastic chemotherapy: Secondary | ICD-10-CM | POA: Diagnosis not present

## 2017-11-13 LAB — COMPREHENSIVE METABOLIC PANEL
ALBUMIN: 3.9 g/dL (ref 3.5–5.0)
ALT: 16 U/L (ref 0–55)
AST: 15 U/L (ref 5–34)
Alkaline Phosphatase: 61 U/L (ref 40–150)
Anion gap: 11 (ref 3–11)
BUN: 10 mg/dL (ref 7–26)
CHLORIDE: 107 mmol/L (ref 98–109)
CO2: 24 mmol/L (ref 22–29)
CREATININE: 0.78 mg/dL (ref 0.60–1.10)
Calcium: 9.3 mg/dL (ref 8.4–10.4)
GFR calc Af Amer: >60 mL/min (ref >60)
GFR calc non Af Amer: >60 mL/min (ref >60)
GLUCOSE: 107 mg/dL (ref 70–140)
POTASSIUM: 2.9 mmol/L — AB (ref 3.5–5.1)
SODIUM: 142 mmol/L (ref 136–145)
Total Bilirubin: 0.6 mg/dL (ref 0.2–1.2)
Total Protein: 6.2 g/dL — ABNORMAL LOW (ref 6.4–8.3)

## 2017-11-13 LAB — CBC WITH DIFFERENTIAL/PLATELET
BASOS ABS: 0.1 10*3/uL (ref 0.0–0.1)
BASOS PCT: 1 %
EOS ABS: 0 10*3/uL (ref 0.0–0.5)
EOS PCT: 0 %
HCT: 35.6 % (ref 34.8–46.6)
Hemoglobin: 11.6 g/dL (ref 11.6–15.9)
Lymphocytes Relative: 74 %
Lymphs Abs: 8 10*3/uL — ABNORMAL HIGH (ref 0.9–3.3)
MCH: 28.6 pg (ref 25.1–34.0)
MCHC: 32.5 g/dL (ref 31.5–36.0)
MCV: 88 fL (ref 79.5–101.0)
MONO ABS: 0.3 10*3/uL (ref 0.1–0.9)
Monocytes Relative: 3 %
Neutro Abs: 2.3 10*3/uL (ref 1.5–6.5)
Neutrophils Relative %: 22 %
PLATELETS: 264 10*3/uL (ref 145–400)
RBC: 4.04 MIL/uL (ref 3.70–5.45)
RDW: 15.7 % — AB (ref 11.2–14.5)
WBC: 10.7 10*3/uL — AB (ref 3.9–10.3)

## 2017-11-13 LAB — URIC ACID: Uric Acid, Serum: 5.4 mg/dL (ref 2.6–7.4)

## 2017-11-13 LAB — LACTATE DEHYDROGENASE: LDH: 204 U/L (ref 125–245)

## 2017-11-13 MED ORDER — HEPARIN SOD (PORK) LOCK FLUSH 100 UNIT/ML IV SOLN
250.0000 [IU] | Freq: Once | INTRAVENOUS | Status: AC
  Administered 2017-11-13: 10:00:00
  Filled 2017-11-13: qty 5

## 2017-11-13 MED ORDER — SODIUM CHLORIDE 0.9% FLUSH
10.0000 mL | Freq: Once | INTRAVENOUS | Status: AC
  Administered 2017-11-13: 10 mL
  Filled 2017-11-13: qty 10

## 2017-11-13 NOTE — Telephone Encounter (Signed)
Potassium 2.9.   Notified patient to increase potassium in diet.

## 2017-11-14 LAB — HEPATITIS B SURFACE ANTIBODY,QUALITATIVE: HEP B S AB: REACTIVE

## 2017-11-14 LAB — HEPATITIS B CORE ANTIBODY, IGM: HEP B C IGM: NEGATIVE

## 2017-11-14 LAB — HEPATITIS B SURFACE ANTIGEN: Hepatitis B Surface Ag: NEGATIVE

## 2017-11-17 ENCOUNTER — Telehealth: Payer: Self-pay | Admitting: *Deleted

## 2017-11-17 MED ORDER — METOPROLOL SUCCINATE ER 100 MG PO TB24: ORAL_TABLET | ORAL | 6 refills | Status: DC

## 2017-11-17 NOTE — Telephone Encounter (Signed)
Received alternative request for Toprol from [facility]. Request is asking to send in medication in 100 mg tab formula to take 1 1/2 tablets daily. Pt seen last week in office and Lopressor changed to Toprol.   Will send in updated Rx per pharmacy request.

## 2017-11-18 ENCOUNTER — Inpatient Hospital Stay: Payer: BLUE CROSS/BLUE SHIELD

## 2017-11-18 ENCOUNTER — Telehealth: Payer: Self-pay | Admitting: Hematology and Oncology

## 2017-11-18 ENCOUNTER — Inpatient Hospital Stay (HOSPITAL_BASED_OUTPATIENT_CLINIC_OR_DEPARTMENT_OTHER): Payer: BLUE CROSS/BLUE SHIELD | Admitting: Hematology and Oncology

## 2017-11-18 ENCOUNTER — Ambulatory Visit (HOSPITAL_BASED_OUTPATIENT_CLINIC_OR_DEPARTMENT_OTHER): Payer: Self-pay | Admitting: Medical

## 2017-11-18 ENCOUNTER — Encounter: Payer: Self-pay | Admitting: Hematology and Oncology

## 2017-11-18 VITALS — BP 134/71 | HR 86 | Temp 99.2°F | Resp 17

## 2017-11-18 DIAGNOSIS — T8090XA Unspecified complication following infusion and therapeutic injection, initial encounter: Secondary | ICD-10-CM

## 2017-11-18 DIAGNOSIS — Z452 Encounter for adjustment and management of vascular access device: Secondary | ICD-10-CM

## 2017-11-18 DIAGNOSIS — Z5111 Encounter for antineoplastic chemotherapy: Secondary | ICD-10-CM | POA: Diagnosis not present

## 2017-11-18 DIAGNOSIS — I429 Cardiomyopathy, unspecified: Secondary | ICD-10-CM

## 2017-11-18 DIAGNOSIS — C9110 Chronic lymphocytic leukemia of B-cell type not having achieved remission: Secondary | ICD-10-CM

## 2017-11-18 DIAGNOSIS — Z9221 Personal history of antineoplastic chemotherapy: Secondary | ICD-10-CM

## 2017-11-18 DIAGNOSIS — Z79899 Other long term (current) drug therapy: Secondary | ICD-10-CM

## 2017-11-18 DIAGNOSIS — I428 Other cardiomyopathies: Secondary | ICD-10-CM

## 2017-11-18 DIAGNOSIS — T451X5A Adverse effect of antineoplastic and immunosuppressive drugs, initial encounter: Secondary | ICD-10-CM | POA: Diagnosis not present

## 2017-11-18 DIAGNOSIS — D702 Other drug-induced agranulocytosis: Secondary | ICD-10-CM | POA: Diagnosis not present

## 2017-11-18 DIAGNOSIS — E883 Tumor lysis syndrome: Secondary | ICD-10-CM | POA: Diagnosis not present

## 2017-11-18 MED ORDER — ACETAMINOPHEN 325 MG PO TABS
ORAL_TABLET | ORAL | Status: AC
  Filled 2017-11-18: qty 2

## 2017-11-18 MED ORDER — SODIUM CHLORIDE 0.9% FLUSH
10.0000 mL | INTRAVENOUS | Status: DC | PRN
  Administered 2017-11-18: 10 mL
  Filled 2017-11-18: qty 10

## 2017-11-18 MED ORDER — DIPHENHYDRAMINE HCL 25 MG PO CAPS
ORAL_CAPSULE | ORAL | Status: AC
  Filled 2017-11-18: qty 2

## 2017-11-18 MED ORDER — FAMOTIDINE IN NACL 20-0.9 MG/50ML-% IV SOLN
20.0000 mg | Freq: Once | INTRAVENOUS | Status: DC
Start: 2017-11-18 — End: 2017-11-18

## 2017-11-18 MED ORDER — SODIUM CHLORIDE 0.9 % IV SOLN
20.0000 mg | Freq: Once | INTRAVENOUS | Status: AC
  Administered 2017-11-18: 20 mg via INTRAVENOUS
  Filled 2017-11-18: qty 2

## 2017-11-18 MED ORDER — FAMOTIDINE IN NACL 20-0.9 MG/50ML-% IV SOLN
20.0000 mg | Freq: Once | INTRAVENOUS | Status: DC | PRN
  Filled 2017-11-18: qty 50

## 2017-11-18 MED ORDER — FAMOTIDINE IN NACL 20-0.9 MG/50ML-% IV SOLN
20.0000 mg | Freq: Once | INTRAVENOUS | Status: DC | PRN
  Administered 2017-11-18: 20 mg via INTRAVENOUS

## 2017-11-18 MED ORDER — SODIUM CHLORIDE 0.9 % IV SOLN
20.0000 mg | Freq: Once | INTRAVENOUS | Status: DC
  Filled 2017-11-18: qty 2

## 2017-11-18 MED ORDER — ACETAMINOPHEN 325 MG PO TABS
650.0000 mg | ORAL_TABLET | Freq: Once | ORAL | Status: AC
  Administered 2017-11-18: 650 mg via ORAL

## 2017-11-18 MED ORDER — HEPARIN SOD (PORK) LOCK FLUSH 100 UNIT/ML IV SOLN
500.0000 [IU] | Freq: Once | INTRAVENOUS | Status: AC | PRN
  Administered 2017-11-18: 500 [IU]
  Filled 2017-11-18: qty 5

## 2017-11-18 MED ORDER — SODIUM CHLORIDE 0.9 % IV SOLN
Freq: Once | INTRAVENOUS | Status: AC
  Administered 2017-11-18: 10:00:00 via INTRAVENOUS

## 2017-11-18 MED ORDER — SODIUM CHLORIDE 0.9 % IV SOLN
375.0000 mg/m2 | Freq: Once | INTRAVENOUS | Status: AC
  Administered 2017-11-18: 800 mg via INTRAVENOUS
  Filled 2017-11-18: qty 50

## 2017-11-18 MED ORDER — DIPHENHYDRAMINE HCL 25 MG PO CAPS
50.0000 mg | ORAL_CAPSULE | Freq: Once | ORAL | Status: AC
  Administered 2017-11-18: 50 mg via ORAL

## 2017-11-18 MED ORDER — SODIUM CHLORIDE 0.9 % IV SOLN: 375.0000 mg/m2 | Freq: Once | INTRAVENOUS | Status: DC

## 2017-11-18 NOTE — Progress Notes (Signed)
[facility] OFFICE PROGRESS NOTE  Patient Care Team: Janith Lima, MD as PCP - General (Internal Medicine) Constance Haw, MD as Consulting Physician (Cardiology)  SUMMARY OF ONCOLOGIC HISTORY:   CLL (chronic lymphocytic leukemia) ([place])   04/05/2015 Pathology Results    Accession: [id number] flow cytometry confirmed CLL. FISH was positive for p53 mutation      04/24/2015 Imaging    Extensive lymphadenopathy throughout the neck, chest (axilla), abdomen and pelvis, as detailed above, compatible with the reported clinical history of lymphoma. 2. Mild splenomegaly.      05/03/2015 - 08/27/2015 Chemotherapy    She started on Ibrutinib, discontinued prematurely when her prescription ran out      10/10/2015 - 05/29/2017 Chemotherapy    She was restarted back on Ibrutinib      11/13/2016 PET scan    Significant generalized reduction in size of numerous lymph nodes in the neck, chest, abdomen, and pelvis. Previously the activity of these nodes was low-level and in general a similar low-level activity is present today, significantly less than mediastinal blood pool activity, compatible with Deauville 2. 2. Coronary atherosclerosis. Mild cardiomegaly. 3. Mildly prominent endometrium for age without accentuated metabolic activity in the endometrium. Consider pelvic sonography for further characterization.      05/27/2017 PET scan    1. Progressive hypermetabolic adenopathy, primarily involving cervical, axillary, pelvic and inguinal lymph nodes bilaterally, consistent with progressive lymphoma. 2. No solid visceral organ or osseous involvement.      06/16/2017 -  Chemotherapy    The patient had palonosetron (ALOXI) injection 0.25 mg, 0.25 mg, Intravenous,  Once, 1 of 4 cycles  riTUXimab (RITUXAN) 200 mg in sodium chloride 0.9 % 250 mL (0.7407 mg/mL) chemo infusion, 100 mg/m2 = 200 mg (100 % of original dose 100 mg/m2), Intravenous,  Once, 1 of 4 cycles Dose modification: 100  mg/m2 (original dose 100 mg/m2, Cycle 1, Reason: Provider Judgment, Comment: anticipate high risk infusion)  bendamustine (BENDEKA) 175 mg in sodium chloride 0.9 % 50 mL (3.0702 mg/mL) chemo infusion, 90 mg/m2 = 175 mg, Intravenous,  Once, 1 of 4 cycles  for chemotherapy treatment.  She received Rituximab and Bendamustine      07/15/2017 - 07/19/2017 Hospital Admission    The patient was briefly admitted to the hospital due to infusion reaction to rituximab      09/18/2017 PET scan    1. Continued considerable adenopathy in the neck, chest, abdomen, and pelvis. This is generally stable in size but mildly reduced in activity compared to the prior exam. Current levels of activity primarily Deauville 3 and Deauville 4. No splenomegaly. 2. Diffuse new ground-glass opacities in the lungs with associated hypermetabolic activity. Some forms of lymphoma infiltration can rarely cause this pattern of diffuse ground-glass opacity and hypermetabolic activity. Differential diagnostic considerations might include atypical pneumonia such as mycoplasma, acute hypersensitivity pneumonitis, or acute eosinophilic pneumonia. Pulmonary hemorrhage seems less likely to cause this degree of accentuated metabolic activity.  3. Aortic Atherosclerosis (ICD10-I70.0). Coronary atherosclerosis.      09/24/2017 -  Chemotherapy    The patient had ventoclax for chemotherapy treatment.         INTERVAL HISTORY: Please see below for problem oriented charting. She returns today for further follow-up and chemotherapy with Rituxan She is doing well No denies chest pain or shortness of breath She is undecided about the plan for PICC line versus port  REVIEW OF SYSTEMS:   Constitutional: Denies fevers, chills or abnormal weight loss  Eyes: Denies blurriness of vision Ears, nose, mouth, throat, and face: Denies mucositis or sore throat Respiratory: Denies cough, dyspnea or wheezes Cardiovascular: Denies palpitation,  chest discomfort or lower extremity swelling Gastrointestinal:  Denies nausea, heartburn or change in bowel habits Skin: Denies abnormal skin rashes Lymphatics: Denies new lymphadenopathy or easy bruising Neurological:Denies numbness, tingling or new weaknesses Behavioral/Psych: Mood is stable, no new changes  All other systems were reviewed with the patient and are negative.  I have reviewed the past medical history, past surgical history, social history and family history with the patient and they are unchanged from previous note.  ALLERGIES:  has No Known Allergies.  MEDICATIONS:  Current Outpatient Medications  Medication Sig Dispense Refill  . Cholecalciferol (VITAMIN D-3) 1000 units CAPS Take 1,000 Units by mouth daily with breakfast.    . diltiazem 2 % GEL Apply 1 application topically 3 (three) times daily. Use for 6 weeks (Patient taking differently: Apply 1 application topically See admin instructions. TWO TIMES A DAY TO RECTAL AREA) 30 g 1  . ELIQUIS 5 MG TABS tablet TAKE 1 TABLET BY MOUTH TWICE A DAY (Patient taking differently: Take 5 mg by mouth two times a day) 180 tablet 1  . furosemide (LASIX) 40 MG tablet TAKE 1 TABLET BY MOUTH EVERY DAY (Patient taking differently: Take 40 mg by mouth once a day) 90 tablet 3  . metoprolol succinate (TOPROL-XL) 100 MG 24 hr tablet Take 1 1/2 tablets (150 mg total) daily. Take with or immediately following a meal. 45 tablet 6  . oxyCODONE (OXY IR/ROXICODONE) 5 MG immediate release tablet Take 1 tablet (5 mg total) by mouth every 4 (four) hours as needed for severe pain. 30 tablet 0  . predniSONE (DELTASONE) 10 MG tablet Take 5 mg by mouth daily with breakfast.    . sacubitril-valsartan (ENTRESTO) 24-26 MG Take 1 tablet by mouth 2 (two) times daily. 180 tablet 3  . venetoclax (VENCLEXTA) 100 MG TABS Take 200 mg by mouth daily. 120 tablet 9   No current facility-administered medications for this visit.     PHYSICAL EXAMINATION: ECOG  PERFORMANCE STATUS: 0 - Asymptomatic  Vitals:   11/18/17 0912  BP: 139/85  Pulse: 68  Resp: 18  Temp: (!) 97.4 F (36.3 C)  SpO2: 100%   Filed Weights   11/18/17 0912  Weight: 192 lb 8 oz (87.3 kg)    GENERAL:alert, no distress and comfortable SKIN: skin color, texture, turgor are normal, no rashes or significant lesions EYES: normal, Conjunctiva are pink and non-injected, sclera clear OROPHARYNX:no exudate, no erythema and lips, buccal mucosa, and tongue normal  NECK: supple, thyroid normal size, non-tender, without nodularity LYMPH:  no palpable lymphadenopathy in the cervical, axillary or inguinal LUNGS: clear to auscultation and percussion with normal breathing effort HEART: regular rate & rhythm and no murmurs and no lower extremity edema ABDOMEN:abdomen soft, non-tender and normal bowel sounds Musculoskeletal:no cyanosis of digits and no clubbing  NEURO: alert & oriented x 3 with fluent speech, no focal motor/sensory deficits  LABORATORY DATA:  I have reviewed the data as listed    Component Value Date/Time   NA 142 11/13/2017 0935   NA 140 10/09/2017 1416   K 2.9 (LL) 11/13/2017 0935   K 4.4 10/09/2017 1416   CL 107 11/13/2017 0935   CL 105 01/18/2013 0912   CO2 24 11/13/2017 0935   CO2 25 10/09/2017 1416   GLUCOSE 107 11/13/2017 0935   GLUCOSE 117 10/09/2017 1416  GLUCOSE 83 01/18/2013 0912   BUN 10 11/13/2017 0935   BUN 11.1 10/09/2017 1416   CREATININE 0.78 11/13/2017 0935   CREATININE 0.8 10/09/2017 1416   CALCIUM 9.3 11/13/2017 0935   CALCIUM 9.6 10/09/2017 1416   PROT 6.2 (L) 11/13/2017 0935   PROT 6.4 10/09/2017 1416   ALBUMIN 3.9 11/13/2017 0935   ALBUMIN 3.4 (L) 10/09/2017 1416   AST 15 11/13/2017 0935   AST 18 10/09/2017 1416   ALT 16 11/13/2017 0935   ALT 19 10/09/2017 1416   ALKPHOS 61 11/13/2017 0935   ALKPHOS 69 10/09/2017 1416   BILITOT 0.6 11/13/2017 0935   BILITOT 0.57 10/09/2017 1416   GFRNONAA >60 11/13/2017 0935   GFRAA >60  11/13/2017 0935    No results found for: SPEP, UPEP  Lab Results  Component Value Date   WBC 10.7 (H) 11/13/2017   NEUTROABS 2.3 11/13/2017   HGB 11.6 11/13/2017   HCT 35.6 11/13/2017   MCV 88.0 11/13/2017   PLT 264 11/13/2017      Chemistry      Component Value Date/Time   NA 142 11/13/2017 0935   NA 140 10/09/2017 1416   K 2.9 (LL) 11/13/2017 0935   K 4.4 10/09/2017 1416   CL 107 11/13/2017 0935   CL 105 01/18/2013 0912   CO2 24 11/13/2017 0935   CO2 25 10/09/2017 1416   BUN 10 11/13/2017 0935   BUN 11.1 10/09/2017 1416   CREATININE 0.78 11/13/2017 0935   CREATININE 0.8 10/09/2017 1416      Component Value Date/Time   CALCIUM 9.3 11/13/2017 0935   CALCIUM 9.6 10/09/2017 1416   ALKPHOS 61 11/13/2017 0935   ALKPHOS 69 10/09/2017 1416   AST 15 11/13/2017 0935   AST 18 10/09/2017 1416   ALT 16 11/13/2017 0935   ALT 19 10/09/2017 1416   BILITOT 0.6 11/13/2017 0935   BILITOT 0.57 10/09/2017 1416       RADIOGRAPHIC STUDIES: I have personally reviewed the radiological images as listed and agreed with the findings in the report. Dg Thoracic Spine W/swimmers  Result Date: 10/22/2017 CLINICAL DATA:  [place] 10 days ago pain and stiffness in the mid to low back EXAM: THORACIC SPINE - 3 VIEWS COMPARISON:  Chest x-ray of 10/02/2017 FINDINGS: The thoracic vertebrae are in normal alignment. Intervertebral disc spaces appear normal. No prominent paravertebral soft tissue is seen. There is degenerative change at the C6-7 level. Right central venous line tip overlies the mid SVC. IMPRESSION: 1. Negative thoracic spine.  No acute abnormality. 2. Degenerative disc disease at C6-7. Electronically Signed   By: Ivar Drape M.D.   On: 10/22/2017 16:22   Dg Lumbar Spine Complete  Result Date: 10/22/2017 CLINICAL DATA:  [place] 10 days ago with pain and stiffness in the mid to lower back EXAM: LUMBAR SPINE - COMPLETE 4+ VIEW COMPARISON:  PET-CT of 09/18/2017 FINDINGS: There is a mild  anterolisthesis of L4 on L5 by 3 mm most likely degenerative in origin. Intervertebral disc spaces appear normal. However, there is a mild compression deformity of the anterior superior aspect of L1 vertebral body of approximately 10%. No retropulsion is seen. The SI joints appear normal. IMPRESSION: 1. Acute mild anterior superior compression deformity of L1 of approximately 10%. No retropulsion. 2. Mild anterolisthesis of L4 on L5 by 3 mm. Electronically Signed   By: Ivar Drape M.D.   On: 10/22/2017 16:20    ASSESSMENT & PLAN:  CLL (chronic lymphocytic leukemia) ([place]) We discussed  the risks, benefits, side effects of addition of Rituxan and she agreed to proceed I recommend prednisone taper She will get a PET CT scan tomorrow for proper staging I plan to see her back next week to review test results and repeat blood work. So far, she is tolerating treatment very well  Nonischemic cardiomyopathy ([place]) She has no signs or symptoms of congestive heart failure She will continue medical management  PICC (peripherally inserted central catheter) in place We discussed the risk and benefits of port placement but the patient is undecided I will bring her back next week again for further discussion and PICC line care   No orders of the defined types were placed in this encounter.  All questions were answered. The patient knows to call the clinic with any problems, questions or concerns. No barriers to learning was detected. I spent 15 minutes counseling the patient face to face. The total time spent in the appointment was 20 minutes and more than 50% was on counseling and review of test results     Heath Lark, MD 11/18/2017 9:30 AM

## 2017-11-18 NOTE — Assessment & Plan Note (Signed)
We discussed the risks, benefits, side effects of addition of Rituxan and she agreed to proceed I recommend prednisone taper She will get a PET CT scan tomorrow for proper staging I plan to see her back next week to review test results and repeat blood work. So far, she is tolerating treatment very well

## 2017-11-18 NOTE — Assessment & Plan Note (Signed)
We discussed the risk and benefits of port placement but the patient is undecided I will bring her back next week again for further discussion and PICC line care

## 2017-11-18 NOTE — Telephone Encounter (Signed)
Gave patient AVs and calendar of upcoming February appointments.  °

## 2017-11-18 NOTE — Assessment & Plan Note (Signed)
She has no signs or symptoms of congestive heart failure She will continue medical management 

## 2017-11-18 NOTE — Progress Notes (Signed)
[place] informed that patient c/o "a little nausea" so rate was not adjusted. 1430 RN noted decrease in blood pressure. Rituxan stopped. Hypersensitivity protocol initiated. Normal saline infusing to gravity. Sandi Mealy, PA made aware. Pepcid given. RN instructed to restart infusion 10 minutes after completion of Pepcid, at rate prior to onset of patient's symptoms. Patient reports relief of nausea.

## 2017-11-18 NOTE — Patient Instructions (Signed)
[facility] Discharge Instructions for Patients Receiving Chemotherapy  Today you received the following chemotherapy agents:  Rituxan.  To help prevent nausea and vomiting after your treatment, we encourage you to take your nausea medication as directed.   If you develop nausea and vomiting that is not controlled by your nausea medication, call the clinic.   BELOW ARE SYMPTOMS THAT SHOULD BE REPORTED IMMEDIATELY:  *FEVER GREATER THAN 100.5 F  *CHILLS WITH OR WITHOUT FEVER  NAUSEA AND VOMITING THAT IS NOT CONTROLLED WITH YOUR NAUSEA MEDICATION  *UNUSUAL SHORTNESS OF BREATH  *UNUSUAL BRUISING OR BLEEDING  TENDERNESS IN MOUTH AND THROAT WITH OR WITHOUT PRESENCE OF ULCERS  *URINARY PROBLEMS  *BOWEL PROBLEMS  UNUSUAL RASH Items with * indicate a potential emergency and should be followed up as soon as possible.  Feel free to call the clinic should you have any questions or concerns. The clinic phone number is [phone].  Please show the CHEMO ALERT CARD at check-in to the Emergency Department and triage nurse.   

## 2017-11-19 ENCOUNTER — Telehealth: Payer: Self-pay | Admitting: *Deleted

## 2017-11-19 ENCOUNTER — Encounter (HOSPITAL_COMMUNITY)
Admission: RE | Admit: 2017-11-19 | Discharge: 2017-11-19 | Disposition: A | Discharge status: Home / Self Care | Payer: BLUE CROSS/BLUE SHIELD | Source: Ambulatory Visit | Attending: Hematology and Oncology | Admitting: Hematology and Oncology

## 2017-11-19 ENCOUNTER — Encounter (HOSPITAL_COMMUNITY): Payer: Self-pay

## 2017-11-19 DIAGNOSIS — C9110 Chronic lymphocytic leukemia of B-cell type not having achieved remission: Secondary | ICD-10-CM | POA: Diagnosis not present

## 2017-11-19 DIAGNOSIS — C9190 Lymphoid leukemia, unspecified not having achieved remission: Secondary | ICD-10-CM | POA: Insufficient documentation

## 2017-11-19 LAB — GLUCOSE, CAPILLARY: GLUCOSE-CAPILLARY: 112 mg/dL — AB (ref 65–99)

## 2017-11-19 MED ORDER — FLUDEOXYGLUCOSE F - 18 (FDG) INJECTION
9.9000 | Freq: Once | INTRAVENOUS | Status: AC
  Administered 2017-11-19: 9.9 via INTRAVENOUS

## 2017-11-19 NOTE — Telephone Encounter (Signed)
TCT patient to follow up on 1st time Rituxan infusion yesterday. Pt states she feels well. She states she slept ok last night and is not having issues today.  Reminded pt that if she has any questions or concerns she can call the cancer center @ [phone]. Pt voiced understanding.

## 2017-11-19 NOTE — Progress Notes (Signed)
[facility] Progress Note   Audrey Gross [id number] November 04, 1960 57 y.o.  Audrey Gross is managed by Dr. Lubertha Sayres presents for:  Chemotherapy:  no       Monoclonal Antibody: yes      Immunoglobulin: no      Bisphosphonate: no       Transfusion:  no     Current Therapy: Rituximab  Audrey Gross was receiving rituximab at the time of her reaction.  Assessment: Plan:    Infusion reaction, initial encounter  Audrey Gross was seen in the infusion room for a suspected infusion reaction. She was receiving rituximab at the time of her reaction. She was receiving 250 mg/h prior to onset of symptoms. Her symptoms included: Nausea and hypotension with a blood pressure dropped to 95/49. She was premedicated with Pepcid, Benadryl and Tylenol prior to starting her infusion. Rituximab was paused and Audrey Gross was given Pepcid 20 mg IV and a bolus of IV normal saline after onset of her symptoms.  Audrey Gross did  respond to intervention.  Rituximab was restarted at 200 mg/h 10 minutes after Pepcid was infused.  The patient's blood pressure improved to 106/59.  She was able to tolerate the remainder of her infusion without further issues of concern.  Please see After Visit Summary for patient specific instructions.  Future Appointments  Date Time Provider [place]  11/19/2017  1:00 PM [facility] MOBILE [facility] [place]  11/25/2017  9:00 AM [facility] LAB 5 [facility] None  11/25/2017  9:15 AM [facility] J32 DNS [facility] None  11/25/2017  9:45 AM Heath Lark, MD [facility] None  05/12/2018  9:30 AM [facility] ECHO 3 [id number]  07/14/2018  2:30 PM Salvadore Dom, MD [place] None    No orders of the defined types were placed in this encounter.      Subjective:   Patient ID:  Audrey Gross is a 57 y.o. (DOB 09-10-1961) female.  Chief Complaint: No chief complaint on file.   HPI Audrey Gross was seen in the  infusion room for a suspected infusion reaction. She was receiving rituximab at the time of her reaction. She was receiving 250 mg/h prior to onset of symptoms. Her symptoms included: Nausea and hypotension with a blood pressure dropped to 95/49. She was premedicated with Pepcid, Benadryl and Tylenol prior to starting her infusion. Rituximab was paused and Audrey Gross was given Pepcid 20 mg IV and a bolus of IV normal saline after onset of her symptoms. Audrey Gross did  respond to intervention.  Rituximab was restarted at 200 mg/h 10 minutes after Pepcid was infused.  The patient's blood pressure improved to 106/59.  She was able to tolerate the remainder of her infusion without further issues of concern.  Medications: I have reviewed the patient's current medications.  Allergies: No Known Allergies  Past Medical History:  Diagnosis Date  . Atrial fibrillation ([place])   . CLL (chronic lymphoblastic leukemia)    Leukemia  . Cystic breast   . Depression   . Diffuse cystic mastopathy   . Dyslipidemia (high LDL; low HDL)   . Hypertension   . Iron deficiency anemia, unspecified   . Lymphadenopathy of head and neck 04/05/2015  . Medical non-compliance   . Mitral regurgitation 02/2017   moderate to severe  . Persistent atrial fibrillation with rapid ventricular response ([place]) 07/15/2017    Past Surgical History:  Procedure  Laterality Date  . CARDIOVERSION N/A 04/02/2017   Procedure: CARDIOVERSION;  Surgeon: Dorothy Spark, MD;  Location: [facility] ENDOSCOPY;  Service: Cardiovascular;  Laterality: N/A;  . CARDIOVERSION N/A 08/19/2017   Procedure: CARDIOVERSION;  Surgeon: Larey Dresser, MD;  Location: [facility] ENDOSCOPY;  Service: Cardiovascular;  Laterality: N/A;  . TUBAL LIGATION      Family History  Problem Relation Age of Onset  . Sudden death Mother   . Kidney disease Father        H/O HD and kidney transplant  . Stomach cancer Maternal Aunt   . Stomach cancer Paternal Grandmother   .  Heart disease Brother   . Heart disease Maternal Grandmother     Social History   Socioeconomic History  . Marital status: Married    Spouse name: Not on file  . Number of children: 6  . Years of education: Not on file  . Highest education level: Not on file  Social Needs  . Financial resource strain: Not on file  . Food insecurity - worry: Not on file  . Food insecurity - inability: Not on file  . Transportation needs - medical: Not on file  . Transportation needs - non-medical: Not on file  Occupational History  . Occupation: [employer] manager: [employer]  Tobacco Use  . Smoking status: Never Smoker  . Smokeless tobacco: Never Used  Substance and Sexual Activity  . Alcohol use: No  . Drug use: No  . Sexual activity: Not Currently    Partners: Male    Birth control/protection: Surgical  Other Topics Concern  . Not on file  Social History Narrative  . Not on file    Past Medical History, Surgical history, Social history, and Family history were reviewed and updated as appropriate.   Please see review of systems for further details on the patient's review from today.   Review of Systems:  Review of Systems  Constitutional: Negative for chills and diaphoresis.  HENT: Negative for trouble swallowing.   Respiratory: Negative for cough, choking, chest tightness, shortness of breath and wheezing.   Cardiovascular: Negative for chest pain and palpitations.  Gastrointestinal: Positive for nausea. Negative for abdominal pain and vomiting.  Neurological: Negative for dizziness, tremors and headaches.    Objective:   Physical Exam:  There were no vitals taken for this visit.  Physical Exam  Constitutional: No distress.  HENT:  Head: Normocephalic and atraumatic.  Cardiovascular: Normal rate and regular rhythm. Exam reveals no gallop and no friction rub.  No murmur heard. Pulmonary/Chest: Effort normal and breath sounds normal. No respiratory distress. She has no  wheezes. She has no rales.  Skin: Skin is warm and dry. No rash noted. She is not diaphoretic. No erythema.    Lab Review:     Component Value Date/Time   NA 142 11/13/2017 0935   NA 140 10/09/2017 1416   K 2.9 (LL) 11/13/2017 0935   K 4.4 10/09/2017 1416   CL 107 11/13/2017 0935   CL 105 01/18/2013 0912   CO2 24 11/13/2017 0935   CO2 25 10/09/2017 1416   GLUCOSE 107 11/13/2017 0935   GLUCOSE 117 10/09/2017 1416   GLUCOSE 83 01/18/2013 0912   BUN 10 11/13/2017 0935   BUN 11.1 10/09/2017 1416   CREATININE 0.78 11/13/2017 0935   CREATININE 0.8 10/09/2017 1416   CALCIUM 9.3 11/13/2017 0935   CALCIUM 9.6 10/09/2017 1416   PROT 6.2 (L) 11/13/2017 0935   PROT 6.4 10/09/2017 1416  ALBUMIN 3.9 11/13/2017 0935   ALBUMIN 3.4 (L) 10/09/2017 1416   AST 15 11/13/2017 0935   AST 18 10/09/2017 1416   ALT 16 11/13/2017 0935   ALT 19 10/09/2017 1416   ALKPHOS 61 11/13/2017 0935   ALKPHOS 69 10/09/2017 1416   BILITOT 0.6 11/13/2017 0935   BILITOT 0.57 10/09/2017 1416   GFRNONAA >60 11/13/2017 0935   GFRAA >60 11/13/2017 0935       Component Value Date/Time   WBC 10.7 (H) 11/13/2017 0935   RBC 4.04 11/13/2017 0935   HGB 11.6 11/13/2017 0935   HGB 10.6 (L) 10/09/2017 1416   HCT 35.6 11/13/2017 0935   HCT 35.5 10/09/2017 1416   PLT 264 11/13/2017 0935   PLT 331 10/09/2017 1416   PLT 342 08/11/2017 0928   MCV 88.0 11/13/2017 0935   MCV 90.7 10/09/2017 1416   MCH 28.6 11/13/2017 0935   MCHC 32.5 11/13/2017 0935   RDW 15.7 (H) 11/13/2017 0935   RDW 18.0 (H) 10/09/2017 1416   LYMPHSABS 8.0 (H) 11/13/2017 0935   LYMPHSABS 113.8 (H) 10/09/2017 1416   MONOABS 0.3 11/13/2017 0935   MONOABS 2.5 (H) 10/09/2017 1416   EOSABS 0.0 11/13/2017 0935   EOSABS 0.2 10/09/2017 1416   EOSABS 0.8 (H) 08/11/2017 0928   BASOSABS 0.1 11/13/2017 0935   BASOSABS 1.2 (H) 10/09/2017 1416   -------------------------------  Imaging from last 24 hours (if applicable):  Radiology  interpretation: Dg Thoracic Spine W/swimmers  Result Date: 10/22/2017 CLINICAL DATA:  [place] 10 days ago pain and stiffness in the mid to low back EXAM: THORACIC SPINE - 3 VIEWS COMPARISON:  Chest x-ray of 10/02/2017 FINDINGS: The thoracic vertebrae are in normal alignment. Intervertebral disc spaces appear normal. No prominent paravertebral soft tissue is seen. There is degenerative change at the C6-7 level. Right central venous line tip overlies the mid SVC. IMPRESSION: 1. Negative thoracic spine.  No acute abnormality. 2. Degenerative disc disease at C6-7. Electronically Signed   By: Ivar Drape M.D.   On: 10/22/2017 16:22   Dg Lumbar Spine Complete  Result Date: 10/22/2017 CLINICAL DATA:  [place] 10 days ago with pain and stiffness in the mid to lower back EXAM: LUMBAR SPINE - COMPLETE 4+ VIEW COMPARISON:  PET-CT of 09/18/2017 FINDINGS: There is a mild anterolisthesis of L4 on L5 by 3 mm most likely degenerative in origin. Intervertebral disc spaces appear normal. However, there is a mild compression deformity of the anterior superior aspect of L1 vertebral body of approximately 10%. No retropulsion is seen. The SI joints appear normal. IMPRESSION: 1. Acute mild anterior superior compression deformity of L1 of approximately 10%. No retropulsion. 2. Mild anterolisthesis of L4 on L5 by 3 mm. Electronically Signed   By: Ivar Drape M.D.   On: 10/22/2017 16:20

## 2017-11-19 NOTE — Telephone Encounter (Signed)
-----   Message from Smith Mince, RN sent at 11/18/2017  4:54 PM EST ----- Regarding: Audrey Gross: First time chemo First time Rituxan. Mild reaction (nausea & low blood pressure) at 250mg /hr. Restarted at 200mg /hr after intervention, and subsequent bump ups to a max of 300mg /hr upon completion.

## 2017-11-25 ENCOUNTER — Inpatient Hospital Stay: Payer: BLUE CROSS/BLUE SHIELD

## 2017-11-25 ENCOUNTER — Inpatient Hospital Stay (HOSPITAL_BASED_OUTPATIENT_CLINIC_OR_DEPARTMENT_OTHER): Payer: BLUE CROSS/BLUE SHIELD | Admitting: Hematology and Oncology

## 2017-11-25 ENCOUNTER — Telehealth: Payer: Self-pay | Admitting: Hematology and Oncology

## 2017-11-25 ENCOUNTER — Encounter: Payer: Self-pay | Admitting: Hematology and Oncology

## 2017-11-25 DIAGNOSIS — Z9221 Personal history of antineoplastic chemotherapy: Secondary | ICD-10-CM | POA: Diagnosis not present

## 2017-11-25 DIAGNOSIS — D702 Other drug-induced agranulocytosis: Secondary | ICD-10-CM

## 2017-11-25 DIAGNOSIS — T451X5A Adverse effect of antineoplastic and immunosuppressive drugs, initial encounter: Secondary | ICD-10-CM | POA: Diagnosis not present

## 2017-11-25 DIAGNOSIS — Z79899 Other long term (current) drug therapy: Secondary | ICD-10-CM

## 2017-11-25 DIAGNOSIS — E883 Tumor lysis syndrome: Secondary | ICD-10-CM

## 2017-11-25 DIAGNOSIS — C9110 Chronic lymphocytic leukemia of B-cell type not having achieved remission: Secondary | ICD-10-CM | POA: Diagnosis not present

## 2017-11-25 DIAGNOSIS — I429 Cardiomyopathy, unspecified: Secondary | ICD-10-CM

## 2017-11-25 DIAGNOSIS — I428 Other cardiomyopathies: Secondary | ICD-10-CM

## 2017-11-25 DIAGNOSIS — Z452 Encounter for adjustment and management of vascular access device: Secondary | ICD-10-CM

## 2017-11-25 DIAGNOSIS — Z95828 Presence of other vascular implants and grafts: Secondary | ICD-10-CM

## 2017-11-25 DIAGNOSIS — Z5111 Encounter for antineoplastic chemotherapy: Secondary | ICD-10-CM | POA: Diagnosis not present

## 2017-11-25 DIAGNOSIS — C9190 Lymphoid leukemia, unspecified not having achieved remission: Secondary | ICD-10-CM

## 2017-11-25 LAB — COMPREHENSIVE METABOLIC PANEL
ALBUMIN: 3.8 g/dL (ref 3.5–5.0)
ALT: 19 U/L (ref 0–55)
AST: 19 U/L (ref 5–34)
Alkaline Phosphatase: 51 U/L (ref 40–150)
Anion gap: 13 — ABNORMAL HIGH (ref 3–11)
BILIRUBIN TOTAL: 0.6 mg/dL (ref 0.2–1.2)
BUN: 12 mg/dL (ref 7–26)
CO2: 24 mmol/L (ref 22–29)
Calcium: 9.6 mg/dL (ref 8.4–10.4)
Chloride: 106 mmol/L (ref 98–109)
Creatinine, Ser: 0.75 mg/dL (ref 0.60–1.10)
GFR calc Af Amer: >60 mL/min (ref >60)
GFR calc non Af Amer: >60 mL/min (ref >60)
GLUCOSE: 87 mg/dL (ref 70–140)
POTASSIUM: 3.3 mmol/L — AB (ref 3.5–5.1)
Sodium: 143 mmol/L (ref 136–145)
TOTAL PROTEIN: 6.4 g/dL (ref 6.4–8.3)

## 2017-11-25 LAB — CBC WITH DIFFERENTIAL/PLATELET
BASOS ABS: 0 10*3/uL (ref 0.0–0.1)
Basophils Relative: 1 %
EOS PCT: 0 %
Eosinophils Absolute: 0 10*3/uL (ref 0.0–0.5)
HCT: 36.9 % (ref 34.8–46.6)
Hemoglobin: 11.9 g/dL (ref 11.6–15.9)
LYMPHS ABS: 1 10*3/uL (ref 0.9–3.3)
LYMPHS PCT: 34 %
MCH: 28.4 pg (ref 25.1–34.0)
MCHC: 32.2 g/dL (ref 31.5–36.0)
MCV: 88.1 fL (ref 79.5–101.0)
MONO ABS: 0.6 10*3/uL (ref 0.1–0.9)
MONOS PCT: 20 %
Neutro Abs: 1.4 10*3/uL — ABNORMAL LOW (ref 1.5–6.5)
Neutrophils Relative %: 45 %
PLATELETS: 216 10*3/uL (ref 145–400)
RBC: 4.19 MIL/uL (ref 3.70–5.45)
RDW: 14 % (ref 11.2–14.5)
WBC: 3 10*3/uL — ABNORMAL LOW (ref 3.9–10.3)

## 2017-11-25 LAB — LACTATE DEHYDROGENASE: LDH: 283 U/L — ABNORMAL HIGH (ref 125–245)

## 2017-11-25 LAB — URIC ACID: Uric Acid, Serum: 8.2 mg/dL — ABNORMAL HIGH (ref 2.6–7.4)

## 2017-11-25 MED ORDER — HEPARIN SOD (PORK) LOCK FLUSH 100 UNIT/ML IV SOLN
250.0000 [IU] | Freq: Once | INTRAVENOUS | Status: AC
  Administered 2017-11-25: 250 [IU]
  Filled 2017-11-25: qty 5

## 2017-11-25 MED ORDER — SODIUM CHLORIDE 0.9% FLUSH
10.0000 mL | Freq: Once | INTRAVENOUS | Status: AC
  Administered 2017-11-25: 10 mL
  Filled 2017-11-25: qty 10

## 2017-11-25 MED ORDER — ALLOPURINOL 300 MG PO TABS: 300.0000 mg | ORAL_TABLET | Freq: Two times a day (BID) | ORAL | 1 refills | Status: DC

## 2017-11-25 NOTE — Assessment & Plan Note (Signed)
She has remarkable response to treatment I recommend we Rituximab monthly and Venetoclax 200 mg daily.  She will complete prednisone taper by the end of the month I will schedule cycle 2 of rituximab next month She will continue weekly blood draw and I plan to see her back in 2 weeks for further assessment

## 2017-11-25 NOTE — Progress Notes (Signed)
[facility] OFFICE PROGRESS NOTE  Patient Care Team: Janith Lima, MD as PCP - General (Internal Medicine) Constance Haw, MD as Consulting Physician (Cardiology)  ASSESSMENT & PLAN:  CLL (chronic lymphocytic leukemia) ([place]) She has remarkable response to treatment I recommend we Rituximab monthly and Venetoclax 200 mg daily.  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 The patient denies recent history of fevers, cough, chills, diarrhea or dysuria. She is asymptomatic from the leukopenia. I will observe for now.  I will continue the chemotherapy at current dose without dosage adjustment.  If the leukopenia gets progressive worse in the future, I might have to delay her treatment or adjust the chemotherapy dose.    Nonischemic cardiomyopathy (H[facility]) Examination is benign She is asymptomatic She is in sinus rhythm with good blood pressure She will continue medical management She has no signs or symptoms of congestive heart failure   No orders of the defined types were placed in this encounter.   INTERVAL HISTORY: Please see below for problem oriented charting. She returns with her husband for further follow-up She had mild infusion reaction with rituximab last week She feels fine Denies chest pain, shortness of breath or recent infection No new progressive lymphadenopathy The patient denies any recent signs or symptoms of bleeding such as spontaneous epistaxis, hematuria or  hematochezia.  SUMMARY OF ONCOLOGIC HISTORY:   CLL (chronic lymphocytic leukemia) ([place])   04/05/2015 Pathology Results    Accession: [id number] flow cytometry confirmed CLL. FISH was positive for p53 mutation      04/24/2015 Imaging    Extensive lymphadenopathy throughout the neck, chest (axilla), abdomen and pelvis, as detailed above, compatible with the reported clinical history of lymphoma. 2. Mild splenomegaly.      05/03/2015 - 08/27/2015 Chemotherapy    She started on Ibrutinib, discontinued prematurely when her prescription ran out      10/10/2015 - 05/29/2017 Chemotherapy    She was restarted back on Ibrutinib      11/13/2016 PET scan    Significant generalized reduction in size of numerous lymph nodes in the neck, chest, abdomen, and pelvis. Previously the activity of these nodes was low-level and in general a similar low-level activity is present today, significantly less than mediastinal blood pool activity, compatible with Deauville 2. 2. Coronary atherosclerosis. Mild cardiomegaly. 3. Mildly prominent endometrium for age without accentuated metabolic activity in the endometrium. Consider pelvic sonography for further characterization.      05/27/2017 PET scan    1. Progressive hypermetabolic adenopathy, primarily involving cervical, axillary, pelvic and inguinal lymph nodes bilaterally, consistent with progressive lymphoma. 2. No solid visceral organ or osseous involvement.      06/16/2017 - 08/25/2017 Chemotherapy    The patient had 3 cycles of Rituximab and Bendamustine      07/15/2017 - 07/19/2017 Hospital Admission    The patient was briefly admitted to the hospital due to infusion reaction to rituximab      09/18/2017 PET scan  1. Continued considerable adenopathy in the neck, chest, abdomen, and pelvis. This is generally stable in size but mildly reduced in activity compared to the prior exam. Current levels of activity primarily Deauville 3 and Deauville 4. No  splenomegaly. 2. Diffuse new ground-glass opacities in the lungs with associated hypermetabolic activity. Some forms of lymphoma infiltration can rarely cause this pattern of diffuse ground-glass opacity and hypermetabolic activity. Differential diagnostic considerations might include atypical pneumonia such as mycoplasma, acute hypersensitivity pneumonitis, or acute eosinophilic pneumonia. Pulmonary hemorrhage seems less likely to cause this degree of accentuated metabolic activity.  3. Aortic Atherosclerosis (ICD10-I70.0). Coronary atherosclerosis.      09/24/2017 -  Chemotherapy    The patient had ventoclax for chemotherapy treatment.  Rituximab is added on 11/18/17      11/19/2017 PET scan    Overall mild interval decrease in hypermetabolic lymphadenopathy throughout the neck, chest, abdomen, and pelvis. No new or increased lymphadenopathy identified.  While there has been resolution of diffuse hypermetabolic bilateral ground-glass pulmonary opacity since prior study, there is a new 14 mm hypermetabolic pulmonary nodule in the posterior right lower lobe. Time course favors inflammatory or infectious etiology over neoplasm. Recommend continued follow-up by chest CT in 3 months.       REVIEW OF SYSTEMS:   Constitutional: Denies fevers, chills or abnormal weight loss Eyes: Denies blurriness of vision Ears, nose, mouth, throat, and face: Denies mucositis or sore throat Respiratory: Denies cough, dyspnea or wheezes Cardiovascular: Denies palpitation, chest discomfort or lower extremity swelling Gastrointestinal:  Denies nausea, heartburn or change in bowel habits Skin: Denies abnormal skin rashes Lymphatics: Denies new lymphadenopathy or easy bruising Neurological:Denies numbness, tingling or new weaknesses Behavioral/Psych: Mood is stable, no new changes  All other systems were reviewed with the patient and are negative.  I have reviewed the past medical history, past surgical history,  social history and family history with the patient and they are unchanged from previous note.  ALLERGIES:  has No Known Allergies.  MEDICATIONS:  Current Outpatient Medications  Medication Sig Dispense Refill  . allopurinol (ZYLOPRIM) 300 MG tablet Take 1 tablet (300 mg total) by mouth 2 (two) times daily. 60 tablet 1  . Cholecalciferol (VITAMIN D-3) 1000 units CAPS Take 1,000 Units by mouth daily with breakfast.    . diltiazem 2 % GEL Apply 1 application topically 3 (three) times daily. Use for 6 weeks (Patient taking differently: Apply 1 application topically See admin instructions. TWO TIMES A DAY TO RECTAL AREA) 30 g 1  . ELIQUIS 5 MG TABS tablet TAKE 1 TABLET BY MOUTH TWICE A DAY (Patient taking differently: Take 5 mg by mouth two times a day) 180 tablet 1  . furosemide (LASIX) 40 MG tablet TAKE 1 TABLET BY MOUTH EVERY DAY (Patient taking differently: Take 40 mg by mouth once a day) 90 tablet 3  . metoprolol succinate (TOPROL-XL) 100 MG 24 hr tablet Take 1 1/2 tablets (150 mg total) daily. Take with or immediately following a meal. 45 tablet 6  . oxyCODONE (OXY IR/ROXICODONE) 5 MG immediate release tablet Take 1 tablet (5 mg total) by mouth every 4 (four) hours as needed for severe pain. 30 tablet 0  . predniSONE (DELTASONE) 10 MG tablet Take 5 mg by mouth daily with breakfast.    . sacubitril-valsartan (ENTRESTO) 24-26 MG Take 1 tablet by mouth 2 (two) times daily. 180 tablet 3  . venetoclax (VENCLEXTA) 100 MG TABS Take 200 mg by mouth daily. 120 tablet 9  No current facility-administered medications for this visit.     PHYSICAL EXAMINATION: ECOG PERFORMANCE STATUS: 1 - Symptomatic but completely ambulatory  Vitals:   11/25/17 0957  BP: (!) 134/92  Pulse: 64  Resp: 18  Temp: 98.4 F (36.9 C)  SpO2: 100%   Filed Weights   11/25/17 0957  Weight: 193 lb (87.5 kg)    GENERAL:alert, no distress and comfortable SKIN: skin color, texture, turgor are normal, no rashes or  significant lesions EYES: normal, Conjunctiva are pink and non-injected, sclera clear OROPHARYNX:no exudate, no erythema and lips, buccal mucosa, and tongue normal  NECK: supple, thyroid normal size, non-tender, without nodularity LYMPH:  no palpable lymphadenopathy in the cervical, axillary or inguinal LUNGS: clear to auscultation and percussion with normal breathing effort HEART: regular rate & rhythm and no murmurs and no lower extremity edema ABDOMEN:abdomen soft, non-tender and normal bowel sounds Musculoskeletal:no cyanosis of digits and no clubbing  NEURO: alert & oriented x 3 with fluent speech, no focal motor/sensory deficits  LABORATORY DATA:  I have reviewed the data as listed    Component Value Date/Time   NA 143 11/25/2017 0905   NA 140 10/09/2017 1416   K 3.3 (L) 11/25/2017 0905   K 4.4 10/09/2017 1416   CL 106 11/25/2017 0905   CL 105 01/18/2013 0912   CO2 24 11/25/2017 0905   CO2 25 10/09/2017 1416   GLUCOSE 87 11/25/2017 0905   GLUCOSE 117 10/09/2017 1416   GLUCOSE 83 01/18/2013 0912   BUN 12 11/25/2017 0905   BUN 11.1 10/09/2017 1416   CREATININE 0.75 11/25/2017 0905   CREATININE 0.8 10/09/2017 1416   CALCIUM 9.6 11/25/2017 0905   CALCIUM 9.6 10/09/2017 1416   PROT 6.4 11/25/2017 0905   PROT 6.4 10/09/2017 1416   ALBUMIN 3.8 11/25/2017 0905   ALBUMIN 3.4 (L) 10/09/2017 1416   AST 19 11/25/2017 0905   AST 18 10/09/2017 1416   ALT 19 11/25/2017 0905   ALT 19 10/09/2017 1416   ALKPHOS 51 11/25/2017 0905   ALKPHOS 69 10/09/2017 1416   BILITOT 0.6 11/25/2017 0905   BILITOT 0.57 10/09/2017 1416   GFRNONAA >60 11/25/2017 0905   GFRAA >60 11/25/2017 0905    No results found for: SPEP, UPEP  Lab Results  Component Value Date   WBC 3.0 (L) 11/25/2017   NEUTROABS 1.4 (L) 11/25/2017   HGB 11.9 11/25/2017   HCT 36.9 11/25/2017   MCV 88.1 11/25/2017   PLT 216 11/25/2017      Chemistry      Component Value Date/Time   NA 143 11/25/2017 0905   NA 140  10/09/2017 1416   K 3.3 (L) 11/25/2017 0905   K 4.4 10/09/2017 1416   CL 106 11/25/2017 0905   CL 105 01/18/2013 0912   CO2 24 11/25/2017 0905   CO2 25 10/09/2017 1416   BUN 12 11/25/2017 0905   BUN 11.1 10/09/2017 1416   CREATININE 0.75 11/25/2017 0905   CREATININE 0.8 10/09/2017 1416      Component Value Date/Time   CALCIUM 9.6 11/25/2017 0905   CALCIUM 9.6 10/09/2017 1416   ALKPHOS 51 11/25/2017 0905   ALKPHOS 69 10/09/2017 1416   AST 19 11/25/2017 0905   AST 18 10/09/2017 1416   ALT 19 11/25/2017 0905   ALT 19 10/09/2017 1416   BILITOT 0.6 11/25/2017 0905   BILITOT 0.57 10/09/2017 1416       RADIOGRAPHIC STUDIES: I have reviewed imaging study with the patient and her  husband I have personally reviewed the radiological images as listed and agreed with the findings in the report. Nm Pet Image Restag (ps) Skull Base To Thigh  Result Date: 11/19/2017 CLINICAL DATA:  Subsequent treatment strategy for chronic lymphocytic leukemia. Undergoing chemotherapy. EXAM: NUCLEAR MEDICINE PET SKULL BASE TO THIGH TECHNIQUE: 9.9 mCi F-18 FDG was injected intravenously. Full-ring PET imaging was performed from the skull base to thigh after the radiotracer. CT data was obtained and used for attenuation correction and anatomic localization. FASTING BLOOD GLUCOSE:  Value: 112 mg/dl COMPARISON:  09/18/2017 FINDINGS: NECK: Bilateral cervical lymphadenopathy at levels 2 through 5 show overall decrease in size and FDG uptake since prior study. Index lymph node on right level 2B currently measures 11 mm on image 30/4 compared to 15 mm previously, and has SUV max of 2.9 compared to 3.3 previously. No increased size or FDG uptake within cervical lymph nodes. Stable mild goiter without focal FDG uptake. CHEST: Previously seen hypermetabolic mediastinal and bilateral hilar nodes have nearly completely resolved. Mild hypermetabolic bilateral axillary lymphadenopathy shows slight decrease since previous study, with  index area in the right axilla measuring 2.4 cm on image 60/4 compared to 3.0 cm previously. Current SUV max equals 2.7 compared to 3.2 previously. Previously seen hypermetabolic diffuse ground-glass pulmonary opacity has resolved since previous study. A new solid pulmonary nodule is seen in the posterior right lower lobe which measures 14 mm on image 37/8, and has SUV max of 6.2. No other suspicious pulmonary nodules are seen on CT images. ABDOMEN/PELVIS: No abnormal hypermetabolic activity within the liver, pancreas, adrenal glands, or spleen. Previously seen mild lymphadenopathy in the gastrohepatic ligament, retroperitoneum, and small bowel mesentery has nearly completely resolved. Mild decrease in lymphadenopathy is also seen throughout the iliac chains bilaterally. Index lymph node in the right obturator region currently measures 11 mm on image 163/4 compared to 1.5 cm previously, and has SUV max of 3.1 compared to 5.0 previously. No increased size or hypermetabolic activity of lymph nodes seen in the abdomen or pelvis. SKELETON: No focal hypermetabolic bone lesions to suggest skeletal metastasis. IMPRESSION: Overall mild interval decrease in hypermetabolic lymphadenopathy throughout the neck, chest, abdomen, and pelvis. No new or increased lymphadenopathy identified. While there has been resolution of diffuse hypermetabolic bilateral ground-glass pulmonary opacity since prior study, there is a new 14 mm hypermetabolic pulmonary nodule in the posterior right lower lobe. Time course favors inflammatory or infectious etiology over neoplasm. Recommend continued follow-up by chest CT in 3 months. Electronically Signed   By: Earle Gell M.D.   On: 11/19/2017 15:22    All questions were answered. The patient knows to call the clinic with any problems, questions or concerns. No barriers to learning was detected.  I spent 25 minutes counseling the patient face to face. The total time spent in the appointment was  30 minutes and more than 50% was on counseling and review of test results  Heath Lark, MD 11/25/2017 10:31 AM

## 2017-11-25 NOTE — Assessment & Plan Note (Signed)
She has mild signs of tumor lysis syndrome since rituximab last week Recommend increase hydration and plan to put her back on allopurinol 300 mg twice a day and will repeat blood work next week

## 2017-11-25 NOTE — Telephone Encounter (Signed)
Gave patient AVs and calendar of upcoming February and March appointments.  °

## 2017-11-25 NOTE — Assessment & Plan Note (Signed)
This is likely due to recent treatment. The patient denies recent history of fevers, cough, chills, diarrhea or dysuria. She is asymptomatic from the leukopenia. I will observe for now.  I will continue the chemotherapy at current dose without dosage adjustment.  If the leukopenia gets progressive worse in the future, I might have to delay her treatment or adjust the chemotherapy dose.   

## 2017-11-25 NOTE — Assessment & Plan Note (Signed)
Examination is benign She is asymptomatic She is in sinus rhythm with good blood pressure She will continue medical management She has no signs or symptoms of congestive heart failure

## 2017-12-02 ENCOUNTER — Inpatient Hospital Stay: Payer: BLUE CROSS/BLUE SHIELD

## 2017-12-02 ENCOUNTER — Telehealth: Payer: Self-pay | Admitting: *Deleted

## 2017-12-02 DIAGNOSIS — T451X5A Adverse effect of antineoplastic and immunosuppressive drugs, initial encounter: Secondary | ICD-10-CM | POA: Diagnosis not present

## 2017-12-02 DIAGNOSIS — D702 Other drug-induced agranulocytosis: Secondary | ICD-10-CM | POA: Diagnosis not present

## 2017-12-02 DIAGNOSIS — I429 Cardiomyopathy, unspecified: Secondary | ICD-10-CM | POA: Diagnosis not present

## 2017-12-02 DIAGNOSIS — J189 Pneumonia, unspecified organism: Secondary | ICD-10-CM | POA: Diagnosis not present

## 2017-12-02 DIAGNOSIS — Z95828 Presence of other vascular implants and grafts: Secondary | ICD-10-CM

## 2017-12-02 DIAGNOSIS — Z5111 Encounter for antineoplastic chemotherapy: Secondary | ICD-10-CM | POA: Diagnosis not present

## 2017-12-02 DIAGNOSIS — C9110 Chronic lymphocytic leukemia of B-cell type not having achieved remission: Secondary | ICD-10-CM | POA: Diagnosis not present

## 2017-12-02 DIAGNOSIS — Z9221 Personal history of antineoplastic chemotherapy: Secondary | ICD-10-CM | POA: Diagnosis not present

## 2017-12-02 DIAGNOSIS — Z452 Encounter for adjustment and management of vascular access device: Secondary | ICD-10-CM

## 2017-12-02 DIAGNOSIS — E883 Tumor lysis syndrome: Secondary | ICD-10-CM | POA: Diagnosis not present

## 2017-12-02 DIAGNOSIS — Z79899 Other long term (current) drug therapy: Secondary | ICD-10-CM | POA: Diagnosis not present

## 2017-12-02 LAB — COMPREHENSIVE METABOLIC PANEL
ALK PHOS: 58 U/L (ref 40–150)
ALT: 21 U/L (ref 0–55)
AST: 18 U/L (ref 5–34)
Albumin: 3.9 g/dL (ref 3.5–5.0)
Anion gap: 13 — ABNORMAL HIGH (ref 3–11)
BILIRUBIN TOTAL: 0.7 mg/dL (ref 0.2–1.2)
BUN: 10 mg/dL (ref 7–26)
CHLORIDE: 106 mmol/L (ref 98–109)
CO2: 23 mmol/L (ref 22–29)
CREATININE: 0.81 mg/dL (ref 0.60–1.10)
Calcium: 9.5 mg/dL (ref 8.4–10.4)
GFR calc Af Amer: >60 mL/min (ref >60)
Glucose, Bld: 141 mg/dL — ABNORMAL HIGH (ref 70–140)
Potassium: 2.9 mmol/L — CL (ref 3.5–5.1)
Sodium: 142 mmol/L (ref 136–145)
TOTAL PROTEIN: 6.3 g/dL — AB (ref 6.4–8.3)

## 2017-12-02 LAB — CBC WITH DIFFERENTIAL/PLATELET
BASOS ABS: 0 10*3/uL (ref 0.0–0.1)
Basophils Relative: 1 %
Eosinophils Absolute: 0 10*3/uL (ref 0.0–0.5)
Eosinophils Relative: 0 %
HCT: 37.5 % (ref 34.8–46.6)
HEMOGLOBIN: 12.1 g/dL (ref 11.6–15.9)
LYMPHS PCT: 52 %
Lymphs Abs: 1.8 10*3/uL (ref 0.9–3.3)
MCH: 28.4 pg (ref 25.1–34.0)
MCHC: 32.3 g/dL (ref 31.5–36.0)
MCV: 88 fL (ref 79.5–101.0)
Monocytes Absolute: 0.4 10*3/uL (ref 0.1–0.9)
Monocytes Relative: 12 %
NEUTROS ABS: 1.2 10*3/uL — AB (ref 1.5–6.5)
Neutrophils Relative %: 35 %
Platelets: 242 10*3/uL (ref 145–400)
RBC: 4.26 MIL/uL (ref 3.70–5.45)
RDW: 13.9 % (ref 11.2–14.5)
WBC: 3.4 10*3/uL — AB (ref 3.9–10.3)

## 2017-12-02 LAB — LACTATE DEHYDROGENASE: LDH: 234 U/L (ref 125–245)

## 2017-12-02 LAB — URIC ACID: Uric Acid, Serum: 3.2 mg/dL (ref 2.6–7.4)

## 2017-12-02 MED ORDER — HEPARIN SOD (PORK) LOCK FLUSH 100 UNIT/ML IV SOLN
250.0000 [IU] | Freq: Once | INTRAVENOUS | Status: AC
  Administered 2017-12-02: 250 [IU]
  Filled 2017-12-02: qty 5

## 2017-12-02 MED ORDER — SODIUM CHLORIDE 0.9% FLUSH
10.0000 mL | Freq: Once | INTRAVENOUS | Status: AC
  Administered 2017-12-02: 10 mL
  Filled 2017-12-02: qty 10

## 2017-12-02 MED ORDER — POTASSIUM CHLORIDE CRYS ER 20 MEQ PO TBCR: 20.0000 meq | EXTENDED_RELEASE_TABLET | Freq: Two times a day (BID) | ORAL | 0 refills | Status: DC

## 2017-12-02 NOTE — Telephone Encounter (Signed)
Notified that potassium is 2.9. Has been trying to increase potassium in diet. Dr Alvy Bimler wants her to take potassium 20 meq tablets twice a day for 7 days. Verbalized understanding

## 2017-12-09 ENCOUNTER — Encounter: Payer: Self-pay | Admitting: Hematology and Oncology

## 2017-12-09 ENCOUNTER — Inpatient Hospital Stay: Payer: BLUE CROSS/BLUE SHIELD | Attending: Hematology and Oncology

## 2017-12-09 ENCOUNTER — Inpatient Hospital Stay (HOSPITAL_BASED_OUTPATIENT_CLINIC_OR_DEPARTMENT_OTHER): Payer: BLUE CROSS/BLUE SHIELD | Admitting: Hematology and Oncology

## 2017-12-09 ENCOUNTER — Inpatient Hospital Stay: Payer: BLUE CROSS/BLUE SHIELD

## 2017-12-09 DIAGNOSIS — Z5111 Encounter for antineoplastic chemotherapy: Secondary | ICD-10-CM | POA: Diagnosis not present

## 2017-12-09 DIAGNOSIS — I428 Other cardiomyopathies: Secondary | ICD-10-CM | POA: Insufficient documentation

## 2017-12-09 DIAGNOSIS — C9110 Chronic lymphocytic leukemia of B-cell type not having achieved remission: Secondary | ICD-10-CM

## 2017-12-09 DIAGNOSIS — Z95828 Presence of other vascular implants and grafts: Secondary | ICD-10-CM

## 2017-12-09 DIAGNOSIS — D702 Other drug-induced agranulocytosis: Secondary | ICD-10-CM | POA: Diagnosis not present

## 2017-12-09 DIAGNOSIS — Z79899 Other long term (current) drug therapy: Secondary | ICD-10-CM

## 2017-12-09 DIAGNOSIS — Z452 Encounter for adjustment and management of vascular access device: Secondary | ICD-10-CM

## 2017-12-09 LAB — COMPREHENSIVE METABOLIC PANEL
ALBUMIN: 3.9 g/dL (ref 3.5–5.0)
ALT: 30 U/L (ref 0–55)
ANION GAP: 10 (ref 3–11)
AST: 25 U/L (ref 5–34)
Alkaline Phosphatase: 59 U/L (ref 40–150)
BUN: 8 mg/dL (ref 7–26)
CO2: 26 mmol/L (ref 22–29)
Calcium: 9.7 mg/dL (ref 8.4–10.4)
Chloride: 107 mmol/L (ref 98–109)
Creatinine, Ser: 0.72 mg/dL (ref 0.60–1.10)
Glucose, Bld: 98 mg/dL (ref 70–140)
POTASSIUM: 3.3 mmol/L — AB (ref 3.5–5.1)
Sodium: 143 mmol/L (ref 136–145)
Total Bilirubin: 0.9 mg/dL (ref 0.2–1.2)
Total Protein: 6.5 g/dL (ref 6.4–8.3)

## 2017-12-09 LAB — CBC WITH DIFFERENTIAL/PLATELET
BASOS PCT: 0 %
Basophils Absolute: 0 10*3/uL (ref 0.0–0.1)
Eosinophils Absolute: 0 10*3/uL (ref 0.0–0.5)
Eosinophils Relative: 0 %
HEMATOCRIT: 36.5 % (ref 34.8–46.6)
Hemoglobin: 11.8 g/dL (ref 11.6–15.9)
Lymphocytes Relative: 46 %
Lymphs Abs: 1.4 10*3/uL (ref 0.9–3.3)
MCH: 28.3 pg (ref 25.1–34.0)
MCHC: 32.3 g/dL (ref 31.5–36.0)
MCV: 87.5 fL (ref 79.5–101.0)
MONO ABS: 0.6 10*3/uL (ref 0.1–0.9)
MONOS PCT: 20 %
NEUTROS ABS: 1.1 10*3/uL — AB (ref 1.5–6.5)
Neutrophils Relative %: 34 %
Platelets: 248 10*3/uL (ref 145–400)
RBC: 4.17 MIL/uL (ref 3.70–5.45)
RDW: 13.7 % (ref 11.2–14.5)
WBC: 3.1 10*3/uL — ABNORMAL LOW (ref 3.9–10.3)

## 2017-12-09 LAB — URIC ACID: URIC ACID, SERUM: 2.7 mg/dL (ref 2.6–7.4)

## 2017-12-09 LAB — LACTATE DEHYDROGENASE: LDH: 246 U/L — AB (ref 125–245)

## 2017-12-09 MED ORDER — SODIUM CHLORIDE 0.9% FLUSH
10.0000 mL | Freq: Once | INTRAVENOUS | Status: AC
  Administered 2017-12-09: 10 mL
  Filled 2017-12-09: qty 10

## 2017-12-09 MED ORDER — HEPARIN SOD (PORK) LOCK FLUSH 100 UNIT/ML IV SOLN
250.0000 [IU] | Freq: Once | INTRAVENOUS | Status: AC
  Administered 2017-12-09: 250 [IU]
  Filled 2017-12-09: qty 5

## 2017-12-09 NOTE — Assessment & Plan Note (Signed)
This is likely due to recent treatment. The patient denies recent history of fevers, cough, chills, diarrhea or dysuria. She is asymptomatic from the leukopenia. I will observe for now.  I will continue the chemotherapy at current dose without dosage adjustment.  If the leukopenia gets progressive worse in the future, I might have to delay her treatment or adjust the chemotherapy dose.   

## 2017-12-09 NOTE — Progress Notes (Signed)
[facility] OFFICE PROGRESS NOTE  Patient Care Team: Janith Lima, MD as PCP - General (Internal Medicine) Constance Haw, MD as Consulting Physician (Cardiology)  ASSESSMENT & PLAN:  CLL (chronic lymphocytic leukemia) ([place]) She has remarkable response to treatment I recommend we continue on Rituximab monthly and Venetoclax 200 mg daily.   She has recently completed prednisone taper by the end of February She will continue weekly blood draw and I plan to see her back in 2 weeks for further assessment  Drug-induced neutropenia ([place]) This is likely due to recent treatment. The patient denies recent history of fevers, cough, chills, diarrhea or dysuria. She is asymptomatic from the leukopenia. I will observe for now.  I will continue the chemotherapy at current dose without dosage adjustment.  If the leukopenia gets progressive worse in the future, I might have to delay her treatment or adjust the chemotherapy dose.    Nonischemic cardiomyopathy (HCC) Examination is benign She is asymptomatic She is in sinus rhythm with good blood pressure She will continue medical management She has no signs or symptoms of congestive heart failure   No orders of the defined types were placed in this encounter.   INTERVAL HISTORY: Please see below for problem oriented charting. She returns for further follow-up She feels well Denies cough, chest pain or shortness of breath No new lymphadenopathy She is compliant taking Venetoclax as scheduled She denies recent nausea, diarrhea or changes in bowel habits  SUMMARY OF ONCOLOGIC HISTORY:   CLL (chronic lymphocytic leukemia) ([place])   04/05/2015 Pathology Results    Accession: [id number] flow cytometry confirmed CLL. FISH was positive for p53 mutation      04/24/2015 Imaging    Extensive lymphadenopathy throughout the neck, chest (axilla), abdomen and pelvis, as detailed above, compatible with the reported clinical history of  lymphoma. 2. Mild splenomegaly.      05/03/2015 - 08/27/2015 Chemotherapy    She started on Ibrutinib, discontinued prematurely when her prescription ran out      10/10/2015 - 05/29/2017 Chemotherapy    She was restarted back on Ibrutinib      11/13/2016 PET scan    Significant generalized reduction in size of numerous lymph nodes in the neck, chest, abdomen, and pelvis. Previously the activity of these nodes was low-level and in general a similar low-level activity is present today, significantly less than mediastinal blood pool activity, compatible with Deauville 2. 2. Coronary atherosclerosis. Mild cardiomegaly. 3. Mildly prominent endometrium for age without accentuated metabolic activity in the endometrium. Consider pelvic sonography for further characterization.      05/27/2017 PET scan    1. Progressive hypermetabolic adenopathy, primarily involving cervical, axillary, pelvic and inguinal lymph nodes bilaterally, consistent with progressive lymphoma. 2. No solid visceral organ or osseous involvement.      06/16/2017 - 08/25/2017 Chemotherapy    The patient had 3 cycles of Rituximab and Bendamustine      07/15/2017 - 07/19/2017 Hospital Admission    The patient was briefly admitted to the hospital due to infusion reaction to rituximab      09/18/2017 PET scan    1. Continued considerable adenopathy in the neck, chest, abdomen, and pelvis. This is generally stable in size but mildly reduced in activity compared to the prior exam. Current levels of activity primarily Deauville 3 and Deauville 4. No splenomegaly. 2. Diffuse new ground-glass opacities in the lungs with associated hypermetabolic activity. Some forms of lymphoma infiltration can rarely cause this pattern of diffuse  ground-glass opacity and hypermetabolic activity. Differential diagnostic considerations might include atypical pneumonia such as mycoplasma, acute hypersensitivity pneumonitis, or acute eosinophilic pneumonia.  Pulmonary hemorrhage seems less likely to cause this degree of accentuated metabolic activity.  3. Aortic Atherosclerosis (ICD10-I70.0). Coronary atherosclerosis.      09/24/2017 -  Chemotherapy    The patient had ventoclax for chemotherapy treatment.  Rituximab is added on 11/18/17      11/19/2017 PET scan    Overall mild interval decrease in hypermetabolic lymphadenopathy throughout the neck, chest, abdomen, and pelvis. No new or increased lymphadenopathy identified.  While there has been resolution of diffuse hypermetabolic bilateral ground-glass pulmonary opacity since prior study, there is a new 14 mm hypermetabolic pulmonary nodule in the posterior right lower lobe. Time course favors inflammatory or infectious etiology over neoplasm. Recommend continued follow-up by chest CT in 3 months.       REVIEW OF SYSTEMS:   Constitutional: Denies fevers, chills or abnormal weight loss Eyes: Denies blurriness of vision Ears, nose, mouth, throat, and face: Denies mucositis or sore throat Respiratory: Denies cough, dyspnea or wheezes Cardiovascular: Denies palpitation, chest discomfort or lower extremity swelling Gastrointestinal:  Denies nausea, heartburn or change in bowel habits Skin: Denies abnormal skin rashes Lymphatics: Denies new lymphadenopathy or easy bruising Neurological:Denies numbness, tingling or new weaknesses Behavioral/Psych: Mood is stable, no new changes  All other systems were reviewed with the patient and are negative.  I have reviewed the past medical history, past surgical history, social history and family history with the patient and they are unchanged from previous note.  ALLERGIES:  has No Known Allergies.  MEDICATIONS:  Current Outpatient Medications  Medication Sig Dispense Refill  . allopurinol (ZYLOPRIM) 300 MG tablet Take 1 tablet (300 mg total) by mouth 2 (two) times daily. 60 tablet 1  . Cholecalciferol (VITAMIN D-3) 1000 units CAPS Take 1,000 Units  by mouth daily with breakfast.    . ELIQUIS 5 MG TABS tablet TAKE 1 TABLET BY MOUTH TWICE A DAY (Patient taking differently: Take 5 mg by mouth two times a day) 180 tablet 1  . furosemide (LASIX) 40 MG tablet TAKE 1 TABLET BY MOUTH EVERY DAY (Patient taking differently: Take 40 mg by mouth once a day) 90 tablet 3  . metoprolol succinate (TOPROL-XL) 100 MG 24 hr tablet Take 1 1/2 tablets (150 mg total) daily. Take with or immediately following a meal. 45 tablet 6  . potassium chloride SA (K-DUR,KLOR-CON) 20 MEQ tablet Take 1 tablet (20 mEq total) by mouth 2 (two) times daily. 14 tablet 0  . sacubitril-valsartan (ENTRESTO) 24-26 MG Take 1 tablet by mouth 2 (two) times daily. 180 tablet 3  . venetoclax (VENCLEXTA) 100 MG TABS Take 200 mg by mouth daily. 120 tablet 9   No current facility-administered medications for this visit.     PHYSICAL EXAMINATION: ECOG PERFORMANCE STATUS: 0 - Asymptomatic  Vitals:   12/09/17 1002  BP: 130/84  Pulse: 68  Resp: 18  Temp: 98.4 F (36.9 C)  SpO2: 100%   Filed Weights   12/09/17 1002  Weight: 198 lb 3.2 oz (89.9 kg)    GENERAL:alert, no distress and comfortable.  She looks mildly cushingoid SKIN: skin color, texture, turgor are normal, no rashes or significant lesions EYES: normal, Conjunctiva are pink and non-injected, sclera clear OROPHARYNX:no exudate, no erythema and lips, buccal mucosa, and tongue normal  NECK: supple, thyroid normal size, non-tender, without nodularity LYMPH:  no palpable lymphadenopathy in the cervical, axillary or inguinal  LUNGS: clear to auscultation and percussion with normal breathing effort HEART: regular rate & rhythm and no murmurs and no lower extremity edema ABDOMEN:abdomen soft, non-tender and normal bowel sounds Musculoskeletal:no cyanosis of digits and no clubbing  NEURO: alert & oriented x 3 with fluent speech, no focal motor/sensory deficits  LABORATORY DATA:  I have reviewed the data as listed     Component Value Date/Time   NA 143 12/09/2017 0906   NA 140 10/09/2017 1416   K 3.3 (L) 12/09/2017 0906   K 4.4 10/09/2017 1416   CL 107 12/09/2017 0906   CL 105 01/18/2013 0912   CO2 26 12/09/2017 0906   CO2 25 10/09/2017 1416   GLUCOSE 98 12/09/2017 0906   GLUCOSE 117 10/09/2017 1416   GLUCOSE 83 01/18/2013 0912   BUN 8 12/09/2017 0906   BUN 11.1 10/09/2017 1416   CREATININE 0.72 12/09/2017 0906   CREATININE 0.8 10/09/2017 1416   CALCIUM 9.7 12/09/2017 0906   CALCIUM 9.6 10/09/2017 1416   PROT 6.5 12/09/2017 0906   PROT 6.4 10/09/2017 1416   ALBUMIN 3.9 12/09/2017 0906   ALBUMIN 3.4 (L) 10/09/2017 1416   AST 25 12/09/2017 0906   AST 18 10/09/2017 1416   ALT 30 12/09/2017 0906   ALT 19 10/09/2017 1416   ALKPHOS 59 12/09/2017 0906   ALKPHOS 69 10/09/2017 1416   BILITOT 0.9 12/09/2017 0906   BILITOT 0.57 10/09/2017 1416   GFRNONAA >60 12/09/2017 0906   GFRAA >60 12/09/2017 0906    No results found for: SPEP, UPEP  Lab Results  Component Value Date   WBC 3.1 (L) 12/09/2017   NEUTROABS 1.1 (L) 12/09/2017   HGB 11.8 12/09/2017   HCT 36.5 12/09/2017   MCV 87.5 12/09/2017   PLT 248 12/09/2017      Chemistry      Component Value Date/Time   NA 143 12/09/2017 0906   NA 140 10/09/2017 1416   K 3.3 (L) 12/09/2017 0906   K 4.4 10/09/2017 1416   CL 107 12/09/2017 0906   CL 105 01/18/2013 0912   CO2 26 12/09/2017 0906   CO2 25 10/09/2017 1416   BUN 8 12/09/2017 0906   BUN 11.1 10/09/2017 1416   CREATININE 0.72 12/09/2017 0906   CREATININE 0.8 10/09/2017 1416      Component Value Date/Time   CALCIUM 9.7 12/09/2017 0906   CALCIUM 9.6 10/09/2017 1416   ALKPHOS 59 12/09/2017 0906   ALKPHOS 69 10/09/2017 1416   AST 25 12/09/2017 0906   AST 18 10/09/2017 1416   ALT 30 12/09/2017 0906   ALT 19 10/09/2017 1416   BILITOT 0.9 12/09/2017 0906   BILITOT 0.57 10/09/2017 1416       RADIOGRAPHIC STUDIES: I have personally reviewed the radiological images as listed  and agreed with the findings in the report. Nm Pet Image Restag (ps) Skull Base To Thigh  Result Date: 11/19/2017 CLINICAL DATA:  Subsequent treatment strategy for chronic lymphocytic leukemia. Undergoing chemotherapy. EXAM: NUCLEAR MEDICINE PET SKULL BASE TO THIGH TECHNIQUE: 9.9 mCi F-18 FDG was injected intravenously. Full-ring PET imaging was performed from the skull base to thigh after the radiotracer. CT data was obtained and used for attenuation correction and anatomic localization. FASTING BLOOD GLUCOSE:  Value: 112 mg/dl COMPARISON:  09/18/2017 FINDINGS: NECK: Bilateral cervical lymphadenopathy at levels 2 through 5 show overall decrease in size and FDG uptake since prior study. Index lymph node on right level 2B currently measures 11 mm on image 30/4 compared to  15 mm previously, and has SUV max of 2.9 compared to 3.3 previously. No increased size or FDG uptake within cervical lymph nodes. Stable mild goiter without focal FDG uptake. CHEST: Previously seen hypermetabolic mediastinal and bilateral hilar nodes have nearly completely resolved. Mild hypermetabolic bilateral axillary lymphadenopathy shows slight decrease since previous study, with index area in the right axilla measuring 2.4 cm on image 60/4 compared to 3.0 cm previously. Current SUV max equals 2.7 compared to 3.2 previously. Previously seen hypermetabolic diffuse ground-glass pulmonary opacity has resolved since previous study. A new solid pulmonary nodule is seen in the posterior right lower lobe which measures 14 mm on image 37/8, and has SUV max of 6.2. No other suspicious pulmonary nodules are seen on CT images. ABDOMEN/PELVIS: No abnormal hypermetabolic activity within the liver, pancreas, adrenal glands, or spleen. Previously seen mild lymphadenopathy in the gastrohepatic ligament, retroperitoneum, and small bowel mesentery has nearly completely resolved. Mild decrease in lymphadenopathy is also seen throughout the iliac chains  bilaterally. Index lymph node in the right obturator region currently measures 11 mm on image 163/4 compared to 1.5 cm previously, and has SUV max of 3.1 compared to 5.0 previously. No increased size or hypermetabolic activity of lymph nodes seen in the abdomen or pelvis. SKELETON: No focal hypermetabolic bone lesions to suggest skeletal metastasis. IMPRESSION: Overall mild interval decrease in hypermetabolic lymphadenopathy throughout the neck, chest, abdomen, and pelvis. No new or increased lymphadenopathy identified. While there has been resolution of diffuse hypermetabolic bilateral ground-glass pulmonary opacity since prior study, there is a new 14 mm hypermetabolic pulmonary nodule in the posterior right lower lobe. Time course favors inflammatory or infectious etiology over neoplasm. Recommend continued follow-up by chest CT in 3 months. Electronically Signed   By: Earle Gell M.D.   On: 11/19/2017 15:22    All questions were answered. The patient knows to call the clinic with any problems, questions or concerns. No barriers to learning was detected.  I spent 15 minutes counseling the patient face to face. The total time spent in the appointment was 20 minutes and more than 50% was on counseling and review of test results  Heath Lark, MD 12/09/2017 11:22 AM

## 2017-12-09 NOTE — Assessment & Plan Note (Signed)
Examination is benign She is asymptomatic She is in sinus rhythm with good blood pressure She will continue medical management She has no signs or symptoms of congestive heart failure

## 2017-12-09 NOTE — Assessment & Plan Note (Signed)
She has remarkable response to treatment I recommend we continue on Rituximab monthly and Venetoclax 200 mg daily.   She has recently completed prednisone taper by the end of February She will continue weekly blood draw and I plan to see her back in 2 weeks for further assessment

## 2017-12-14 MED FILL — VENCLEXTA 100 MG TABS: 100 | 30 days supply | Qty: 120 | Fill #0

## 2017-12-16 ENCOUNTER — Other Ambulatory Visit: Payer: Self-pay

## 2017-12-16 ENCOUNTER — Inpatient Hospital Stay: Payer: BLUE CROSS/BLUE SHIELD | Admitting: Hematology and Oncology

## 2017-12-16 ENCOUNTER — Inpatient Hospital Stay: Payer: BLUE CROSS/BLUE SHIELD

## 2017-12-16 VITALS — BP 115/70 | HR 62 | Temp 98.0°F | Resp 16

## 2017-12-16 DIAGNOSIS — Z79899 Other long term (current) drug therapy: Secondary | ICD-10-CM | POA: Diagnosis not present

## 2017-12-16 DIAGNOSIS — C9110 Chronic lymphocytic leukemia of B-cell type not having achieved remission: Secondary | ICD-10-CM

## 2017-12-16 DIAGNOSIS — I428 Other cardiomyopathies: Secondary | ICD-10-CM | POA: Diagnosis not present

## 2017-12-16 DIAGNOSIS — Z5111 Encounter for antineoplastic chemotherapy: Secondary | ICD-10-CM | POA: Diagnosis not present

## 2017-12-16 DIAGNOSIS — Z452 Encounter for adjustment and management of vascular access device: Secondary | ICD-10-CM

## 2017-12-16 DIAGNOSIS — Z95828 Presence of other vascular implants and grafts: Secondary | ICD-10-CM

## 2017-12-16 DIAGNOSIS — D702 Other drug-induced agranulocytosis: Secondary | ICD-10-CM | POA: Diagnosis not present

## 2017-12-16 LAB — COMPREHENSIVE METABOLIC PANEL
ALBUMIN: 3.7 g/dL (ref 3.5–5.0)
ALK PHOS: 57 U/L (ref 40–150)
ALT: 31 U/L (ref 0–55)
ANION GAP: 9 (ref 3–11)
AST: 30 U/L (ref 5–34)
BILIRUBIN TOTAL: 1.1 mg/dL (ref 0.2–1.2)
BUN: 8 mg/dL (ref 7–26)
CALCIUM: 9.7 mg/dL (ref 8.4–10.4)
CO2: 25 mmol/L (ref 22–29)
Chloride: 108 mmol/L (ref 98–109)
Creatinine, Ser: 0.78 mg/dL (ref 0.60–1.10)
GFR calc Af Amer: >60 mL/min (ref >60)
Glucose, Bld: 121 mg/dL (ref 70–140)
POTASSIUM: 3.2 mmol/L — AB (ref 3.5–5.1)
Sodium: 142 mmol/L (ref 136–145)
TOTAL PROTEIN: 6.2 g/dL — AB (ref 6.4–8.3)

## 2017-12-16 LAB — CBC WITH DIFFERENTIAL/PLATELET
BASOS PCT: 0 %
Basophils Absolute: 0 10*3/uL (ref 0.0–0.1)
Eosinophils Absolute: 0 10*3/uL (ref 0.0–0.5)
Eosinophils Relative: 0 %
HEMATOCRIT: 34.7 % — AB (ref 34.8–46.6)
Hemoglobin: 11.1 g/dL — ABNORMAL LOW (ref 11.6–15.9)
LYMPHS ABS: 1.5 10*3/uL (ref 0.9–3.3)
Lymphocytes Relative: 47 %
MCH: 27.9 pg (ref 25.1–34.0)
MCHC: 32 g/dL (ref 31.5–36.0)
MCV: 87.2 fL (ref 79.5–101.0)
MONOS PCT: 20 %
Monocytes Absolute: 0.7 10*3/uL (ref 0.1–0.9)
NEUTROS ABS: 1.1 10*3/uL — AB (ref 1.5–6.5)
Neutrophils Relative %: 33 %
Platelets: 219 10*3/uL (ref 145–400)
RBC: 3.98 MIL/uL (ref 3.70–5.45)
RDW: 13.6 % (ref 11.2–14.5)
WBC: 3.3 10*3/uL — ABNORMAL LOW (ref 3.9–10.3)

## 2017-12-16 LAB — LACTATE DEHYDROGENASE: LDH: 266 U/L — ABNORMAL HIGH (ref 125–245)

## 2017-12-16 LAB — URIC ACID: URIC ACID, SERUM: 2.6 mg/dL (ref 2.6–7.4)

## 2017-12-16 MED ORDER — ONDANSETRON HCL 4 MG/2ML IJ SOLN
INTRAMUSCULAR | Status: AC
  Filled 2017-12-16: qty 4

## 2017-12-16 MED ORDER — DIPHENHYDRAMINE HCL 25 MG PO CAPS
ORAL_CAPSULE | ORAL | Status: AC
  Filled 2017-12-16: qty 2

## 2017-12-16 MED ORDER — DIPHENHYDRAMINE HCL 25 MG PO CAPS
50.0000 mg | ORAL_CAPSULE | Freq: Once | ORAL | Status: AC
  Administered 2017-12-16: 50 mg via ORAL

## 2017-12-16 MED ORDER — SODIUM CHLORIDE 0.9 % IV SOLN
Freq: Once | INTRAVENOUS | Status: AC
  Administered 2017-12-16: 10:00:00 via INTRAVENOUS

## 2017-12-16 MED ORDER — HEPARIN SOD (PORK) LOCK FLUSH 100 UNIT/ML IV SOLN
500.0000 [IU] | Freq: Once | INTRAVENOUS | Status: AC
  Administered 2017-12-16: 500 [IU]
  Filled 2017-12-16: qty 5

## 2017-12-16 MED ORDER — SODIUM CHLORIDE 0.9% FLUSH
10.0000 mL | Freq: Once | INTRAVENOUS | Status: AC
  Administered 2017-12-16: 10 mL
  Filled 2017-12-16: qty 10

## 2017-12-16 MED ORDER — ACETAMINOPHEN 325 MG PO TABS
650.0000 mg | ORAL_TABLET | Freq: Once | ORAL | Status: AC
  Administered 2017-12-16: 650 mg via ORAL

## 2017-12-16 MED ORDER — ONDANSETRON HCL 4 MG/2ML IJ SOLN
8.0000 mg | Freq: Once | INTRAMUSCULAR | Status: AC
  Administered 2017-12-16: 8 mg via INTRAVENOUS

## 2017-12-16 MED ORDER — SODIUM CHLORIDE 0.9 % IV SOLN
Freq: Once | INTRAVENOUS | Status: DC
  Filled 2017-12-16: qty 4

## 2017-12-16 MED ORDER — ACETAMINOPHEN 325 MG PO TABS
ORAL_TABLET | ORAL | Status: AC
  Filled 2017-12-16: qty 2

## 2017-12-16 MED ORDER — FAMOTIDINE IN NACL 20-0.9 MG/50ML-% IV SOLN: 20.0000 mg | Freq: Once | INTRAVENOUS | Status: DC

## 2017-12-16 MED ORDER — SODIUM CHLORIDE 0.9 % IV SOLN
375.0000 mg/m2 | Freq: Once | INTRAVENOUS | Status: AC
  Administered 2017-12-16: 800 mg via INTRAVENOUS
  Filled 2017-12-16: qty 50

## 2017-12-16 MED ORDER — SODIUM CHLORIDE 0.9 % IV SOLN
20.0000 mg | Freq: Once | INTRAVENOUS | Status: AC
  Administered 2017-12-16: 20 mg via INTRAVENOUS
  Filled 2017-12-16: qty 2

## 2017-12-16 NOTE — Progress Notes (Signed)
OK to treat with ANC 1.1 per Dr Alvy Bimler

## 2017-12-16 NOTE — Patient Instructions (Signed)
[facility] Discharge Instructions for Patients Receiving Chemotherapy  Today you received the following chemotherapy agents:  Rituxan   To help prevent nausea and vomiting after your treatment, we encourage you to take your nausea medication as prescribed.   If you develop nausea and vomiting that is not controlled by your nausea medication, call the clinic.   BELOW ARE SYMPTOMS THAT SHOULD BE REPORTED IMMEDIATELY:  *FEVER GREATER THAN 100.5 F  *CHILLS WITH OR WITHOUT FEVER  NAUSEA AND VOMITING THAT IS NOT CONTROLLED WITH YOUR NAUSEA MEDICATION  *UNUSUAL SHORTNESS OF BREATH  *UNUSUAL BRUISING OR BLEEDING  TENDERNESS IN MOUTH AND THROAT WITH OR WITHOUT PRESENCE OF ULCERS  *URINARY PROBLEMS  *BOWEL PROBLEMS  UNUSUAL RASH Items with * indicate a potential emergency and should be followed up as soon as possible.  Feel free to call the clinic should you have any questions or concerns. The clinic phone number is [phone].  Please show the CHEMO ALERT CARD at check-in to the Emergency Department and triage nurse.   

## 2017-12-21 NOTE — Progress Notes (Unsigned)
No entry 

## 2017-12-23 ENCOUNTER — Telehealth: Payer: Self-pay | Admitting: *Deleted

## 2017-12-23 ENCOUNTER — Inpatient Hospital Stay: Payer: BLUE CROSS/BLUE SHIELD

## 2017-12-23 DIAGNOSIS — Z452 Encounter for adjustment and management of vascular access device: Secondary | ICD-10-CM

## 2017-12-23 DIAGNOSIS — Z95828 Presence of other vascular implants and grafts: Secondary | ICD-10-CM

## 2017-12-23 DIAGNOSIS — Z79899 Other long term (current) drug therapy: Secondary | ICD-10-CM | POA: Diagnosis not present

## 2017-12-23 DIAGNOSIS — I428 Other cardiomyopathies: Secondary | ICD-10-CM | POA: Diagnosis not present

## 2017-12-23 DIAGNOSIS — C9110 Chronic lymphocytic leukemia of B-cell type not having achieved remission: Secondary | ICD-10-CM

## 2017-12-23 DIAGNOSIS — D702 Other drug-induced agranulocytosis: Secondary | ICD-10-CM | POA: Diagnosis not present

## 2017-12-23 DIAGNOSIS — Z5111 Encounter for antineoplastic chemotherapy: Secondary | ICD-10-CM | POA: Diagnosis not present

## 2017-12-23 LAB — CBC WITH DIFFERENTIAL/PLATELET
BASOS ABS: 0 10*3/uL (ref 0.0–0.1)
Basophils Relative: 1 %
EOS PCT: 0 %
Eosinophils Absolute: 0 10*3/uL (ref 0.0–0.5)
HEMATOCRIT: 34.3 % — AB (ref 34.8–46.6)
Hemoglobin: 11.2 g/dL — ABNORMAL LOW (ref 11.6–15.9)
LYMPHS ABS: 1.3 10*3/uL (ref 0.9–3.3)
LYMPHS PCT: 41 %
MCH: 27.8 pg (ref 25.1–34.0)
MCHC: 32.5 g/dL (ref 31.5–36.0)
MCV: 85.5 fL (ref 79.5–101.0)
MONO ABS: 0.6 10*3/uL (ref 0.1–0.9)
Monocytes Relative: 20 %
NEUTROS ABS: 1.2 10*3/uL — AB (ref 1.5–6.5)
Neutrophils Relative %: 38 %
Platelets: 235 10*3/uL (ref 145–400)
RBC: 4.02 MIL/uL (ref 3.70–5.45)
RDW: 14.6 % — AB (ref 11.2–14.5)
WBC: 3.1 10*3/uL — ABNORMAL LOW (ref 3.9–10.3)

## 2017-12-23 LAB — COMPREHENSIVE METABOLIC PANEL
ALBUMIN: 3.8 g/dL (ref 3.5–5.0)
ALT: 34 U/L (ref 0–55)
ANION GAP: 9 (ref 3–11)
AST: 29 U/L (ref 5–34)
Alkaline Phosphatase: 62 U/L (ref 40–150)
BUN: 9 mg/dL (ref 7–26)
CHLORIDE: 109 mmol/L (ref 98–109)
CO2: 24 mmol/L (ref 22–29)
Calcium: 9.6 mg/dL (ref 8.4–10.4)
Creatinine, Ser: 0.74 mg/dL (ref 0.60–1.10)
GFR calc Af Amer: >60 mL/min (ref >60)
GFR calc non Af Amer: >60 mL/min (ref >60)
GLUCOSE: 101 mg/dL (ref 70–140)
Potassium: 3.3 mmol/L — ABNORMAL LOW (ref 3.5–5.1)
SODIUM: 142 mmol/L (ref 136–145)
Total Bilirubin: 0.9 mg/dL (ref 0.2–1.2)
Total Protein: 6.2 g/dL — ABNORMAL LOW (ref 6.4–8.3)

## 2017-12-23 LAB — URIC ACID: Uric Acid, Serum: 2.5 mg/dL — ABNORMAL LOW (ref 2.6–7.4)

## 2017-12-23 LAB — LACTATE DEHYDROGENASE: LDH: 269 U/L — ABNORMAL HIGH (ref 125–245)

## 2017-12-23 MED ORDER — SODIUM CHLORIDE 0.9% FLUSH
10.0000 mL | Freq: Once | INTRAVENOUS | Status: AC
  Administered 2017-12-23: 10 mL
  Filled 2017-12-23: qty 10

## 2017-12-23 MED ORDER — HEPARIN SOD (PORK) LOCK FLUSH 100 UNIT/ML IV SOLN
250.0000 [IU] | Freq: Once | INTRAVENOUS | Status: AC
  Administered 2017-12-23: 250 [IU]
  Filled 2017-12-23: qty 5

## 2017-12-23 NOTE — Telephone Encounter (Signed)
-----   Message from Heath Lark, MD sent at 12/23/2017 11:51 AM EDT ----- Regarding: labs stable pls let her know all labs are stable and good ----- Message ----- From: Interface, Lab In [place] Sent: 12/23/2017  10:09 AM To: Heath Lark, MD

## 2017-12-24 ENCOUNTER — Telehealth: Payer: Self-pay

## 2017-12-24 NOTE — Telephone Encounter (Signed)
-----   Message from Patton Salles, RN sent at 12/24/2017 10:50 AM EDT ----- Regarding: FW: labs stable   ----- Message ----- From: Heath Lark, MD Sent: 12/23/2017  11:51 AM To: Patton Salles, RN Subject: labs stable                                    pls let her know all labs are stable and good ----- Message ----- From: Buel Ream, Lab In [place] Sent: 12/23/2017  10:09 AM To: Heath Lark, MD

## 2017-12-24 NOTE — Telephone Encounter (Signed)
Called and given below message. Verbalized understanding. 

## 2017-12-30 ENCOUNTER — Inpatient Hospital Stay: Payer: BLUE CROSS/BLUE SHIELD

## 2017-12-30 ENCOUNTER — Telehealth: Payer: Self-pay | Admitting: Hematology and Oncology

## 2017-12-30 ENCOUNTER — Inpatient Hospital Stay (HOSPITAL_BASED_OUTPATIENT_CLINIC_OR_DEPARTMENT_OTHER): Payer: BLUE CROSS/BLUE SHIELD | Admitting: Hematology and Oncology

## 2017-12-30 ENCOUNTER — Encounter: Payer: Self-pay | Admitting: Hematology and Oncology

## 2017-12-30 DIAGNOSIS — Z95828 Presence of other vascular implants and grafts: Secondary | ICD-10-CM

## 2017-12-30 DIAGNOSIS — Z79899 Other long term (current) drug therapy: Secondary | ICD-10-CM

## 2017-12-30 DIAGNOSIS — I428 Other cardiomyopathies: Secondary | ICD-10-CM

## 2017-12-30 DIAGNOSIS — D702 Other drug-induced agranulocytosis: Secondary | ICD-10-CM

## 2017-12-30 DIAGNOSIS — C9110 Chronic lymphocytic leukemia of B-cell type not having achieved remission: Secondary | ICD-10-CM

## 2017-12-30 DIAGNOSIS — Z5111 Encounter for antineoplastic chemotherapy: Secondary | ICD-10-CM | POA: Diagnosis not present

## 2017-12-30 DIAGNOSIS — Z452 Encounter for adjustment and management of vascular access device: Secondary | ICD-10-CM

## 2017-12-30 LAB — CBC WITH DIFFERENTIAL/PLATELET
BASOS PCT: 1 %
Basophils Absolute: 0 10*3/uL (ref 0.0–0.1)
Eosinophils Absolute: 0 10*3/uL (ref 0.0–0.5)
Eosinophils Relative: 0 %
HEMATOCRIT: 35.2 % (ref 34.8–46.6)
Hemoglobin: 11.3 g/dL — ABNORMAL LOW (ref 11.6–15.9)
Lymphocytes Relative: 53 %
Lymphs Abs: 1.7 10*3/uL (ref 0.9–3.3)
MCH: 27.8 pg (ref 25.1–34.0)
MCHC: 32.1 g/dL (ref 31.5–36.0)
MCV: 86.7 fL (ref 79.5–101.0)
MONOS PCT: 15 %
Monocytes Absolute: 0.5 10*3/uL (ref 0.1–0.9)
Neutro Abs: 1 10*3/uL — ABNORMAL LOW (ref 1.5–6.5)
Neutrophils Relative %: 31 %
Platelets: 256 10*3/uL (ref 145–400)
RBC: 4.06 MIL/uL (ref 3.70–5.45)
RDW: 14 % (ref 11.2–14.5)
WBC: 3.1 10*3/uL — ABNORMAL LOW (ref 3.9–10.3)

## 2017-12-30 LAB — COMPREHENSIVE METABOLIC PANEL
ALBUMIN: 3.9 g/dL (ref 3.5–5.0)
ALT: 33 U/L (ref 0–55)
ANION GAP: 12 — AB (ref 3–11)
AST: 30 U/L (ref 5–34)
Alkaline Phosphatase: 70 U/L (ref 40–150)
BILIRUBIN TOTAL: 0.8 mg/dL (ref 0.2–1.2)
BUN: 7 mg/dL (ref 7–26)
CO2: 23 mmol/L (ref 22–29)
Calcium: 9.7 mg/dL (ref 8.4–10.4)
Chloride: 110 mmol/L — ABNORMAL HIGH (ref 98–109)
Creatinine, Ser: 0.79 mg/dL (ref 0.60–1.10)
GFR calc Af Amer: >60 mL/min (ref >60)
GFR calc non Af Amer: >60 mL/min (ref >60)
GLUCOSE: 128 mg/dL (ref 70–140)
POTASSIUM: 3.5 mmol/L (ref 3.5–5.1)
Sodium: 145 mmol/L (ref 136–145)
TOTAL PROTEIN: 6.3 g/dL — AB (ref 6.4–8.3)

## 2017-12-30 LAB — URIC ACID: URIC ACID, SERUM: 3.1 mg/dL (ref 2.6–7.4)

## 2017-12-30 LAB — LACTATE DEHYDROGENASE: LDH: 299 U/L — ABNORMAL HIGH (ref 125–245)

## 2017-12-30 MED ORDER — SODIUM CHLORIDE 0.9% FLUSH
10.0000 mL | Freq: Once | INTRAVENOUS | Status: AC
  Administered 2017-12-30: 10 mL
  Filled 2017-12-30: qty 10

## 2017-12-30 MED ORDER — HEPARIN SOD (PORK) LOCK FLUSH 100 UNIT/ML IV SOLN
250.0000 [IU] | Freq: Once | INTRAVENOUS | Status: AC
  Administered 2017-12-30: 250 [IU]
  Filled 2017-12-30: qty 5

## 2017-12-30 NOTE — Assessment & Plan Note (Signed)
She has remarkable response to treatment I recommend we continue on Rituximab monthly and Venetoclax 200 mg daily.   She has recently completed prednisone taper by the end of February She will continue close blood counts monitoring  I plan to repeat PET/CT scan in May

## 2017-12-30 NOTE — Assessment & Plan Note (Signed)
This is likely due to recent treatment. The patient denies recent history of fevers, cough, chills, diarrhea or dysuria. She is asymptomatic from the leukopenia. I will observe for now.  I will continue the chemotherapy at current dose without dosage adjustment.  If the leukopenia gets progressive worse in the future, I might have to delay her treatment or adjust the chemotherapy dose.   

## 2017-12-30 NOTE — Telephone Encounter (Signed)
Gave patient AVS and calendar of upcoming April and may appointments.  °

## 2017-12-30 NOTE — Assessment & Plan Note (Signed)
Examination is benign She is asymptomatic She is in sinus rhythm with good blood pressure She will continue medical management She has no signs or symptoms of congestive heart failure

## 2017-12-30 NOTE — Progress Notes (Signed)
[facility] OFFICE PROGRESS NOTE  Patient Care Team: Janith Lima, MD as PCP - General (Internal Medicine) Constance Haw, MD as Consulting Physician (Cardiology)  ASSESSMENT & PLAN:  CLL (chronic lymphocytic leukemia) ([facility]) She has remarkable response to treatment I recommend we continue on Rituximab monthly and Venetoclax 200 mg daily.   She has recently completed prednisone taper by the end of February She will continue close blood counts monitoring  I plan to repeat PET/CT scan in May  Drug-induced neutropenia [facility]) This is likely due to recent treatment. The patient denies recent history of fevers, cough, chills, diarrhea or dysuria. She is asymptomatic from the leukopenia. I will observe for now.  I will continue the chemotherapy at current dose without dosage adjustment.  If the leukopenia gets progressive worse in the future, I might have to delay her treatment or adjust the chemotherapy dose.  Nonischemic cardiomyopathy (HCC) Examination is benign She is asymptomatic She is in sinus rhythm with good blood pressure She will continue medical management She has no signs or symptoms of congestive heart failure   Orders Placed This Encounter  Procedures  . NM PET Image Restag (PS) Skull Base To Thigh    Standing Status:   Future    Standing Expiration Date:   12/31/2018    Order Specific Question:   If indicated for the ordered procedure, I authorize the administration of a radiopharmaceutical per Radiology protocol    Answer:   Yes    Order Specific Question:   Preferred imaging location?    Answer:   [facility]    Order Specific Question:   Radiology Contrast Protocol - do NOT remove file path    Answer:   [email or web address]    Order Specific Question:   Is the patient pregnant?    Answer:   No    INTERVAL HISTORY: Please see below for problem oriented charting. She is seen for further follow-up She is doing  very well She denies reaction to rituximab She is taking all her prescriptions as directed She denies signs or symptoms of congestive heart failure She denies recent infection or new lymphadenopathy.  SUMMARY OF ONCOLOGIC HISTORY:   CLL (chronic lymphocytic leukemia) ([facility])   04/05/2015 Pathology Results    Accession: [id number] flow cytometry confirmed CLL. FISH was positive for p53 mutation      04/24/2015 Imaging    Extensive lymphadenopathy throughout the neck, chest (axilla), abdomen and pelvis, as detailed above, compatible with the reported clinical history of lymphoma. 2. Mild splenomegaly.      05/03/2015 - 08/27/2015 Chemotherapy    She started on Ibrutinib, discontinued prematurely when her prescription ran out      10/10/2015 - 05/29/2017 Chemotherapy    She was restarted back on Ibrutinib      11/13/2016 PET scan    Significant generalized reduction in size of numerous lymph nodes in the neck, chest, abdomen, and pelvis. Previously the activity of these nodes was low-level and in general a similar low-level activity is present today, significantly less than mediastinal blood pool activity, compatible with Deauville 2. 2. Coronary atherosclerosis. Mild cardiomegaly. 3. Mildly prominent endometrium for age without accentuated metabolic activity in the endometrium. Consider pelvic sonography for further characterization.      05/27/2017 PET scan    1. Progressive hypermetabolic adenopathy, primarily involving cervical, axillary, pelvic and inguinal lymph nodes bilaterally, consistent with progressive lymphoma. 2. No solid visceral organ or osseous involvement.  06/16/2017 - 08/25/2017 Chemotherapy    The patient had 3 cycles of Rituximab and Bendamustine      07/15/2017 - 07/19/2017 Hospital Admission    The patient was briefly admitted to the hospital due to infusion reaction to rituximab      09/18/2017 PET scan    1. Continued considerable adenopathy in the neck,  chest, abdomen, and pelvis. This is generally stable in size but mildly reduced in activity compared to the prior exam. Current levels of activity primarily Deauville 3 and Deauville 4. No splenomegaly. 2. Diffuse new ground-glass opacities in the lungs with associated hypermetabolic activity. Some forms of lymphoma infiltration can rarely cause this pattern of diffuse ground-glass opacity and hypermetabolic activity. Differential diagnostic considerations might include atypical pneumonia such as mycoplasma, acute hypersensitivity pneumonitis, or acute eosinophilic pneumonia. Pulmonary hemorrhage seems less likely to cause this degree of accentuated metabolic activity.  3. Aortic Atherosclerosis (ICD10-I70.0). Coronary atherosclerosis.      09/24/2017 -  Chemotherapy    The patient had ventoclax for chemotherapy treatment.  Rituximab is added on 11/18/17      11/19/2017 PET scan    Overall mild interval decrease in hypermetabolic lymphadenopathy throughout the neck, chest, abdomen, and pelvis. No new or increased lymphadenopathy identified.  While there has been resolution of diffuse hypermetabolic bilateral ground-glass pulmonary opacity since prior study, there is a new 14 mm hypermetabolic pulmonary nodule in the posterior right lower lobe. Time course favors inflammatory or infectious etiology over neoplasm. Recommend continued follow-up by chest CT in 3 months.       REVIEW OF SYSTEMS:   Constitutional: Denies fevers, chills or abnormal weight loss Eyes: Denies blurriness of vision Ears, nose, mouth, throat, and face: Denies mucositis or sore throat Respiratory: Denies cough, dyspnea or wheezes Cardiovascular: Denies palpitation, chest discomfort or lower extremity swelling Gastrointestinal:  Denies nausea, heartburn or change in bowel habits Skin: Denies abnormal skin rashes Lymphatics: Denies new lymphadenopathy or easy bruising Neurological:Denies numbness, tingling or new  weaknesses Behavioral/Psych: Mood is stable, no new changes  All other systems were reviewed with the patient and are negative.  I have reviewed the past medical history, past surgical history, social history and family history with the patient and they are unchanged from previous note.  ALLERGIES:  has No Known Allergies.  MEDICATIONS:  Current Outpatient Medications  Medication Sig Dispense Refill  . allopurinol (ZYLOPRIM) 300 MG tablet Take 1 tablet (300 mg total) by mouth 2 (two) times daily. 60 tablet 1  . Cholecalciferol (VITAMIN D-3) 1000 units CAPS Take 1,000 Units by mouth daily with breakfast.    . ELIQUIS 5 MG TABS tablet TAKE 1 TABLET BY MOUTH TWICE A DAY (Patient taking differently: Take 5 mg by mouth two times a day) 180 tablet 1  . furosemide (LASIX) 40 MG tablet TAKE 1 TABLET BY MOUTH EVERY DAY (Patient taking differently: Take 40 mg by mouth once a day) 90 tablet 3  . metoprolol succinate (TOPROL-XL) 100 MG 24 hr tablet Take 1 1/2 tablets (150 mg total) daily. Take with or immediately following a meal. 45 tablet 6  . potassium chloride SA (K-DUR,KLOR-CON) 20 MEQ tablet Take 1 tablet (20 mEq total) by mouth 2 (two) times daily. 14 tablet 0  . sacubitril-valsartan (ENTRESTO) 24-26 MG Take 1 tablet by mouth 2 (two) times daily. 180 tablet 3  . venetoclax (VENCLEXTA) 100 MG TABS Take 200 mg by mouth daily. 120 tablet 9   No current facility-administered medications for this visit.  PHYSICAL EXAMINATION: ECOG PERFORMANCE STATUS: 1 - Symptomatic but completely ambulatory  Vitals:   12/30/17 0947  BP: (!) 133/92  Pulse: 69  Resp: 18  Temp: 98.4 F (36.9 C)  SpO2: 100%   Filed Weights   12/30/17 0947  Weight: 200 lb 1.6 oz (90.8 kg)    GENERAL:alert, no distress and comfortable SKIN: skin color, texture, turgor are normal, no rashes or significant lesions EYES: normal, Conjunctiva are pink and non-injected, sclera clear OROPHARYNX:no exudate, no erythema and  lips, buccal mucosa, and tongue normal  NECK: supple, thyroid normal size, non-tender, without nodularity LYMPH:  no palpable lymphadenopathy in the cervical, axillary or inguinal LUNGS: clear to auscultation and percussion with normal breathing effort HEART: regular rate & rhythm and no murmurs and no lower extremity edema ABDOMEN:abdomen soft, non-tender and normal bowel sounds Musculoskeletal:no cyanosis of digits and no clubbing  NEURO: alert & oriented x 3 with fluent speech, no focal motor/sensory deficits  LABORATORY DATA:  I have reviewed the data as listed    Component Value Date/Time   NA 145 12/30/2017 0848   NA 140 10/09/2017 1416   K 3.5 12/30/2017 0848   K 4.4 10/09/2017 1416   CL 110 (H) 12/30/2017 0848   CL 105 01/18/2013 0912   CO2 23 12/30/2017 0848   CO2 25 10/09/2017 1416   GLUCOSE 128 12/30/2017 0848   GLUCOSE 117 10/09/2017 1416   GLUCOSE 83 01/18/2013 0912   BUN 7 12/30/2017 0848   BUN 11.1 10/09/2017 1416   CREATININE 0.79 12/30/2017 0848   CREATININE 0.8 10/09/2017 1416   CALCIUM 9.7 12/30/2017 0848   CALCIUM 9.6 10/09/2017 1416   PROT 6.3 (L) 12/30/2017 0848   PROT 6.4 10/09/2017 1416   ALBUMIN 3.9 12/30/2017 0848   ALBUMIN 3.4 (L) 10/09/2017 1416   AST 30 12/30/2017 0848   AST 18 10/09/2017 1416   ALT 33 12/30/2017 0848   ALT 19 10/09/2017 1416   ALKPHOS 70 12/30/2017 0848   ALKPHOS 69 10/09/2017 1416   BILITOT 0.8 12/30/2017 0848   BILITOT 0.57 10/09/2017 1416   GFRNONAA >60 12/30/2017 0848   GFRAA >60 12/30/2017 0848    No results found for: SPEP, UPEP  Lab Results  Component Value Date   WBC 3.1 (L) 12/30/2017   NEUTROABS 1.0 (L) 12/30/2017   HGB 11.3 (L) 12/30/2017   HCT 35.2 12/30/2017   MCV 86.7 12/30/2017   PLT 256 12/30/2017      Chemistry      Component Value Date/Time   NA 145 12/30/2017 0848   NA 140 10/09/2017 1416   K 3.5 12/30/2017 0848   K 4.4 10/09/2017 1416   CL 110 (H) 12/30/2017 0848   CL 105 01/18/2013  0912   CO2 23 12/30/2017 0848   CO2 25 10/09/2017 1416   BUN 7 12/30/2017 0848   BUN 11.1 10/09/2017 1416   CREATININE 0.79 12/30/2017 0848   CREATININE 0.8 10/09/2017 1416      Component Value Date/Time   CALCIUM 9.7 12/30/2017 0848   CALCIUM 9.6 10/09/2017 1416   ALKPHOS 70 12/30/2017 0848   ALKPHOS 69 10/09/2017 1416   AST 30 12/30/2017 0848   AST 18 10/09/2017 1416   ALT 33 12/30/2017 0848   ALT 19 10/09/2017 1416   BILITOT 0.8 12/30/2017 0848   BILITOT 0.57 10/09/2017 1416       All questions were answered. The patient knows to call the clinic with any problems, questions or concerns. No barriers  to learning was detected.  I spent 15 minutes counseling the patient face to face. The total time spent in the appointment was 20 minutes and more than 50% was on counseling and review of test results  Heath Lark, MD 12/30/2017 10:16 AM

## 2017-12-31 ENCOUNTER — Other Ambulatory Visit: Payer: Self-pay | Admitting: Cardiology

## 2017-12-31 MED ORDER — METOPROLOL SUCCINATE ER 100 MG PO TB24: ORAL_TABLET | ORAL | 3 refills | Status: DC

## 2018-01-06 ENCOUNTER — Other Ambulatory Visit: Payer: Self-pay | Admitting: Hematology and Oncology

## 2018-01-06 ENCOUNTER — Inpatient Hospital Stay: Payer: BLUE CROSS/BLUE SHIELD | Attending: Hematology and Oncology

## 2018-01-06 ENCOUNTER — Inpatient Hospital Stay: Payer: BLUE CROSS/BLUE SHIELD

## 2018-01-06 ENCOUNTER — Telehealth: Payer: Self-pay

## 2018-01-06 DIAGNOSIS — C9110 Chronic lymphocytic leukemia of B-cell type not having achieved remission: Secondary | ICD-10-CM | POA: Insufficient documentation

## 2018-01-06 DIAGNOSIS — Z79899 Other long term (current) drug therapy: Secondary | ICD-10-CM | POA: Insufficient documentation

## 2018-01-06 DIAGNOSIS — Z5111 Encounter for antineoplastic chemotherapy: Secondary | ICD-10-CM | POA: Insufficient documentation

## 2018-01-06 DIAGNOSIS — Z95828 Presence of other vascular implants and grafts: Secondary | ICD-10-CM

## 2018-01-06 DIAGNOSIS — I428 Other cardiomyopathies: Secondary | ICD-10-CM | POA: Diagnosis not present

## 2018-01-06 DIAGNOSIS — Z452 Encounter for adjustment and management of vascular access device: Secondary | ICD-10-CM

## 2018-01-06 DIAGNOSIS — D702 Other drug-induced agranulocytosis: Secondary | ICD-10-CM | POA: Diagnosis not present

## 2018-01-06 LAB — COMPREHENSIVE METABOLIC PANEL
ALBUMIN: 3.8 g/dL (ref 3.5–5.0)
ALK PHOS: 69 U/L (ref 40–150)
ALT: 29 U/L (ref 0–55)
ANION GAP: 10 (ref 3–11)
AST: 29 U/L (ref 5–34)
BUN: 11 mg/dL (ref 7–26)
CALCIUM: 9.7 mg/dL (ref 8.4–10.4)
CO2: 25 mmol/L (ref 22–29)
CREATININE: 0.72 mg/dL (ref 0.60–1.10)
Chloride: 108 mmol/L (ref 98–109)
GFR calc Af Amer: >60 mL/min (ref >60)
GFR calc non Af Amer: >60 mL/min (ref >60)
GLUCOSE: 115 mg/dL (ref 70–140)
Potassium: 3.5 mmol/L (ref 3.5–5.1)
SODIUM: 143 mmol/L (ref 136–145)
Total Bilirubin: 0.9 mg/dL (ref 0.2–1.2)
Total Protein: 6.2 g/dL — ABNORMAL LOW (ref 6.4–8.3)

## 2018-01-06 LAB — CBC WITH DIFFERENTIAL/PLATELET
BASOS PCT: 0 %
Basophils Absolute: 0 10*3/uL (ref 0.0–0.1)
Eosinophils Absolute: 0 10*3/uL (ref 0.0–0.5)
Eosinophils Relative: 1 %
HEMATOCRIT: 36 % (ref 34.8–46.6)
Hemoglobin: 11.6 g/dL (ref 11.6–15.9)
LYMPHS ABS: 1.9 10*3/uL (ref 0.9–3.3)
Lymphocytes Relative: 48 %
MCH: 27.9 pg (ref 25.1–34.0)
MCHC: 32.2 g/dL (ref 31.5–36.0)
MCV: 86.5 fL (ref 79.5–101.0)
MONO ABS: 0.5 10*3/uL (ref 0.1–0.9)
MONOS PCT: 13 %
NEUTROS ABS: 1.5 10*3/uL (ref 1.5–6.5)
Neutrophils Relative %: 38 %
Platelets: 247 10*3/uL (ref 145–400)
RBC: 4.16 MIL/uL (ref 3.70–5.45)
RDW: 14.1 % (ref 11.2–14.5)
WBC: 3.9 10*3/uL (ref 3.9–10.3)

## 2018-01-06 LAB — URIC ACID: Uric Acid, Serum: 3.7 mg/dL (ref 2.6–7.4)

## 2018-01-06 LAB — LACTATE DEHYDROGENASE: LDH: 302 U/L — AB (ref 125–245)

## 2018-01-06 MED ORDER — SODIUM CHLORIDE 0.9% FLUSH
10.0000 mL | Freq: Once | INTRAVENOUS | Status: AC
  Administered 2018-01-06: 10 mL
  Filled 2018-01-06: qty 10

## 2018-01-06 MED ORDER — HEPARIN SOD (PORK) LOCK FLUSH 100 UNIT/ML IV SOLN
250.0000 [IU] | Freq: Once | INTRAVENOUS | Status: AC
  Administered 2018-01-06: 250 [IU]
  Filled 2018-01-06: qty 5

## 2018-01-06 NOTE — Telephone Encounter (Signed)
Called with below message. Verbalized understanding. 

## 2018-01-06 NOTE — Telephone Encounter (Signed)
Attempted to call to give below message.

## 2018-01-06 NOTE — Telephone Encounter (Signed)
-----   Message from Heath Lark, MD sent at 01/06/2018 11:27 AM EDT ----- Regarding: labs are great pls let her know labs are normal and perfect ----- Message ----- From: Interface, Lab In [place] Sent: 01/06/2018  10:49 AM To: Heath Lark, MD

## 2018-01-13 ENCOUNTER — Inpatient Hospital Stay: Payer: BLUE CROSS/BLUE SHIELD

## 2018-01-13 VITALS — BP 120/73 | HR 59 | Temp 98.0°F | Resp 16

## 2018-01-13 DIAGNOSIS — C9110 Chronic lymphocytic leukemia of B-cell type not having achieved remission: Secondary | ICD-10-CM | POA: Diagnosis not present

## 2018-01-13 DIAGNOSIS — Z79899 Other long term (current) drug therapy: Secondary | ICD-10-CM | POA: Diagnosis not present

## 2018-01-13 DIAGNOSIS — I428 Other cardiomyopathies: Secondary | ICD-10-CM | POA: Diagnosis not present

## 2018-01-13 DIAGNOSIS — Z5111 Encounter for antineoplastic chemotherapy: Secondary | ICD-10-CM | POA: Diagnosis not present

## 2018-01-13 DIAGNOSIS — D702 Other drug-induced agranulocytosis: Secondary | ICD-10-CM | POA: Diagnosis not present

## 2018-01-13 MED ORDER — HEPARIN SOD (PORK) LOCK FLUSH 100 UNIT/ML IV SOLN
250.0000 [IU] | Freq: Once | INTRAVENOUS | Status: AC | PRN
  Administered 2018-01-13: 250 [IU]
  Filled 2018-01-13: qty 5

## 2018-01-13 MED ORDER — SODIUM CHLORIDE 0.9 % IV SOLN
Freq: Once | INTRAVENOUS | Status: AC
Start: 2018-01-13 — End: 2018-01-13
  Administered 2018-01-13: 08:00:00 via INTRAVENOUS

## 2018-01-13 MED ORDER — FAMOTIDINE IN NACL 20-0.9 MG/50ML-% IV SOLN
20.0000 mg | Freq: Once | INTRAVENOUS | Status: AC
  Administered 2018-01-13: 20 mg via INTRAVENOUS

## 2018-01-13 MED ORDER — FAMOTIDINE IN NACL 20-0.9 MG/50ML-% IV SOLN
INTRAVENOUS | Status: AC
  Filled 2018-01-13: qty 50

## 2018-01-13 MED ORDER — DIPHENHYDRAMINE HCL 25 MG PO CAPS
50.0000 mg | ORAL_CAPSULE | Freq: Once | ORAL | Status: AC
  Administered 2018-01-13: 50 mg via ORAL

## 2018-01-13 MED ORDER — SODIUM CHLORIDE 0.9% FLUSH
10.0000 mL | INTRAVENOUS | Status: DC | PRN
  Administered 2018-01-13: 10 mL
  Filled 2018-01-13: qty 10

## 2018-01-13 MED ORDER — RITUXIMAB CHEMO INJECTION 500 MG/50ML
375.0000 mg/m2 | Freq: Once | INTRAVENOUS | Status: AC
  Administered 2018-01-13: 800 mg via INTRAVENOUS
  Filled 2018-01-13: qty 50

## 2018-01-13 MED ORDER — DIPHENHYDRAMINE HCL 25 MG PO CAPS
ORAL_CAPSULE | ORAL | Status: AC
  Filled 2018-01-13: qty 2

## 2018-01-13 MED ORDER — ACETAMINOPHEN 325 MG PO TABS
ORAL_TABLET | ORAL | Status: AC
  Filled 2018-01-13: qty 2

## 2018-01-13 MED ORDER — ACETAMINOPHEN 325 MG PO TABS
650.0000 mg | ORAL_TABLET | Freq: Once | ORAL | Status: AC
  Administered 2018-01-13: 650 mg via ORAL

## 2018-01-13 NOTE — Patient Instructions (Signed)
[facility] Discharge Instructions for Patients Receiving Chemotherapy  Today you received the following chemotherapy agents rituxan.   To help prevent nausea and vomiting after your treatment, we encourage you to take your nausea medication as directed.     If you develop nausea and vomiting that is not controlled by your nausea medication, call the clinic.   BELOW ARE SYMPTOMS THAT SHOULD BE REPORTED IMMEDIATELY:  *FEVER GREATER THAN 100.5 F  *CHILLS WITH OR WITHOUT FEVER  NAUSEA AND VOMITING THAT IS NOT CONTROLLED WITH YOUR NAUSEA MEDICATION  *UNUSUAL SHORTNESS OF BREATH  *UNUSUAL BRUISING OR BLEEDING  TENDERNESS IN MOUTH AND THROAT WITH OR WITHOUT PRESENCE OF ULCERS  *URINARY PROBLEMS  *BOWEL PROBLEMS  UNUSUAL RASH Items with * indicate a potential emergency and should be followed up as soon as possible.  Feel free to call the clinic you have any questions or concerns. The clinic phone number is [phone].  

## 2018-01-18 ENCOUNTER — Other Ambulatory Visit: Payer: Self-pay | Admitting: Pharmacist

## 2018-01-20 ENCOUNTER — Telehealth: Payer: Self-pay | Admitting: *Deleted

## 2018-01-20 ENCOUNTER — Inpatient Hospital Stay: Payer: BLUE CROSS/BLUE SHIELD

## 2018-01-20 ENCOUNTER — Other Ambulatory Visit: Payer: Self-pay | Admitting: Hematology and Oncology

## 2018-01-20 DIAGNOSIS — Z95828 Presence of other vascular implants and grafts: Secondary | ICD-10-CM

## 2018-01-20 DIAGNOSIS — D702 Other drug-induced agranulocytosis: Secondary | ICD-10-CM | POA: Diagnosis not present

## 2018-01-20 DIAGNOSIS — Z5111 Encounter for antineoplastic chemotherapy: Secondary | ICD-10-CM | POA: Diagnosis not present

## 2018-01-20 DIAGNOSIS — Z452 Encounter for adjustment and management of vascular access device: Secondary | ICD-10-CM

## 2018-01-20 DIAGNOSIS — I428 Other cardiomyopathies: Secondary | ICD-10-CM | POA: Diagnosis not present

## 2018-01-20 DIAGNOSIS — Z79899 Other long term (current) drug therapy: Secondary | ICD-10-CM | POA: Diagnosis not present

## 2018-01-20 DIAGNOSIS — C9110 Chronic lymphocytic leukemia of B-cell type not having achieved remission: Secondary | ICD-10-CM

## 2018-01-20 LAB — COMPREHENSIVE METABOLIC PANEL
ALK PHOS: 77 U/L (ref 40–150)
ALT: 42 U/L (ref 0–55)
AST: 34 U/L (ref 5–34)
Albumin: 4 g/dL (ref 3.5–5.0)
Anion gap: 9 (ref 3–11)
BILIRUBIN TOTAL: 0.5 mg/dL (ref 0.2–1.2)
BUN: 8 mg/dL (ref 7–26)
CALCIUM: 9.9 mg/dL (ref 8.4–10.4)
CO2: 26 mmol/L (ref 22–29)
CREATININE: 0.74 mg/dL (ref 0.60–1.10)
Chloride: 106 mmol/L (ref 98–109)
GFR calc Af Amer: >60 mL/min (ref >60)
GFR calc non Af Amer: >60 mL/min (ref >60)
GLUCOSE: 100 mg/dL (ref 70–140)
Potassium: 3.4 mmol/L — ABNORMAL LOW (ref 3.5–5.1)
Sodium: 141 mmol/L (ref 136–145)
TOTAL PROTEIN: 6.6 g/dL (ref 6.4–8.3)

## 2018-01-20 LAB — CBC WITH DIFFERENTIAL/PLATELET
BASOS ABS: 0 10*3/uL (ref 0.0–0.1)
BASOS PCT: 1 %
EOS ABS: 0 10*3/uL (ref 0.0–0.5)
EOS PCT: 0 %
HCT: 35.5 % (ref 34.8–46.6)
Hemoglobin: 11.8 g/dL (ref 11.6–15.9)
LYMPHS PCT: 48 %
Lymphs Abs: 1.7 10*3/uL (ref 0.9–3.3)
MCH: 28.2 pg (ref 25.1–34.0)
MCHC: 33.1 g/dL (ref 31.5–36.0)
MCV: 85.3 fL (ref 79.5–101.0)
MONO ABS: 0.7 10*3/uL (ref 0.1–0.9)
Monocytes Relative: 21 %
Neutro Abs: 1.1 10*3/uL — ABNORMAL LOW (ref 1.5–6.5)
Neutrophils Relative %: 30 %
PLATELETS: 243 10*3/uL (ref 145–400)
RBC: 4.16 MIL/uL (ref 3.70–5.45)
RDW: 14.9 % — AB (ref 11.2–14.5)
WBC: 3.6 10*3/uL — ABNORMAL LOW (ref 3.9–10.3)

## 2018-01-20 LAB — URIC ACID: Uric Acid, Serum: 4.1 mg/dL (ref 2.6–7.4)

## 2018-01-20 LAB — LACTATE DEHYDROGENASE: LDH: 301 U/L — AB (ref 125–245)

## 2018-01-20 MED ORDER — SODIUM CHLORIDE 0.9% FLUSH
10.0000 mL | Freq: Once | INTRAVENOUS | Status: AC
  Administered 2018-01-20: 10 mL
  Filled 2018-01-20: qty 10

## 2018-01-20 MED ORDER — HEPARIN SOD (PORK) LOCK FLUSH 100 UNIT/ML IV SOLN
250.0000 [IU] | Freq: Once | INTRAVENOUS | Status: AC
  Administered 2018-01-20: 100 [IU]
  Filled 2018-01-20: qty 5

## 2018-01-20 NOTE — Telephone Encounter (Signed)
-----   Message from Heath Lark, MD sent at 01/20/2018 11:44 AM EDT ----- Regarding: labs are good pls let her know labs are good Continue same dose chemo ----- Message ----- From: Interface, Lab In [place] Sent: 01/20/2018  10:10 AM To: Heath Lark, MD

## 2018-01-20 NOTE — Telephone Encounter (Signed)
Notified of message below. Verbalized understanding 

## 2018-01-27 ENCOUNTER — Inpatient Hospital Stay: Payer: BLUE CROSS/BLUE SHIELD

## 2018-01-27 DIAGNOSIS — C9110 Chronic lymphocytic leukemia of B-cell type not having achieved remission: Secondary | ICD-10-CM

## 2018-01-27 DIAGNOSIS — D702 Other drug-induced agranulocytosis: Secondary | ICD-10-CM | POA: Diagnosis not present

## 2018-01-27 DIAGNOSIS — I428 Other cardiomyopathies: Secondary | ICD-10-CM | POA: Diagnosis not present

## 2018-01-27 DIAGNOSIS — Z5111 Encounter for antineoplastic chemotherapy: Secondary | ICD-10-CM | POA: Diagnosis not present

## 2018-01-27 DIAGNOSIS — Z95828 Presence of other vascular implants and grafts: Secondary | ICD-10-CM

## 2018-01-27 DIAGNOSIS — Z452 Encounter for adjustment and management of vascular access device: Secondary | ICD-10-CM

## 2018-01-27 DIAGNOSIS — Z79899 Other long term (current) drug therapy: Secondary | ICD-10-CM | POA: Diagnosis not present

## 2018-01-27 LAB — COMPREHENSIVE METABOLIC PANEL
ALK PHOS: 76 U/L (ref 40–150)
ALT: 47 U/L (ref 0–55)
AST: 34 U/L (ref 5–34)
Albumin: 4.3 g/dL (ref 3.5–5.0)
Anion gap: 12 — ABNORMAL HIGH (ref 3–11)
BILIRUBIN TOTAL: 0.8 mg/dL (ref 0.2–1.2)
BUN: 12 mg/dL (ref 7–26)
CALCIUM: 10.3 mg/dL (ref 8.4–10.4)
CO2: 23 mmol/L (ref 22–29)
Chloride: 108 mmol/L (ref 98–109)
Creatinine, Ser: 0.77 mg/dL (ref 0.60–1.10)
GFR calc Af Amer: >60 mL/min (ref >60)
Glucose, Bld: 106 mg/dL (ref 70–140)
POTASSIUM: 3.6 mmol/L (ref 3.5–5.1)
Sodium: 143 mmol/L (ref 136–145)
TOTAL PROTEIN: 6.9 g/dL (ref 6.4–8.3)

## 2018-01-27 LAB — CBC WITH DIFFERENTIAL/PLATELET
BASOS ABS: 0 10*3/uL (ref 0.0–0.1)
Basophils Relative: 0 %
Eosinophils Absolute: 0 10*3/uL (ref 0.0–0.5)
Eosinophils Relative: 0 %
HEMATOCRIT: 37.3 % (ref 34.8–46.6)
HEMOGLOBIN: 12.1 g/dL (ref 11.6–15.9)
LYMPHS PCT: 52 %
Lymphs Abs: 1.9 10*3/uL (ref 0.9–3.3)
MCH: 27.9 pg (ref 25.1–34.0)
MCHC: 32.4 g/dL (ref 31.5–36.0)
MCV: 86.1 fL (ref 79.5–101.0)
MONO ABS: 0.5 10*3/uL (ref 0.1–0.9)
MONOS PCT: 15 %
NEUTROS ABS: 1.2 10*3/uL — AB (ref 1.5–6.5)
Neutrophils Relative %: 33 %
Platelets: 245 10*3/uL (ref 145–400)
RBC: 4.33 MIL/uL (ref 3.70–5.45)
RDW: 14.2 % (ref 11.2–14.5)
WBC: 3.7 10*3/uL — ABNORMAL LOW (ref 3.9–10.3)

## 2018-01-27 MED ORDER — SODIUM CHLORIDE 0.9% FLUSH
10.0000 mL | Freq: Once | INTRAVENOUS | Status: AC
  Administered 2018-01-27: 10 mL
  Filled 2018-01-27: qty 10

## 2018-01-27 MED ORDER — HEPARIN SOD (PORK) LOCK FLUSH 100 UNIT/ML IV SOLN
500.0000 [IU] | Freq: Once | INTRAVENOUS | Status: AC
  Administered 2018-01-27: 500 [IU]
  Filled 2018-01-27: qty 5

## 2018-01-27 NOTE — Patient Instructions (Signed)

## 2018-02-03 ENCOUNTER — Inpatient Hospital Stay (HOSPITAL_BASED_OUTPATIENT_CLINIC_OR_DEPARTMENT_OTHER): Payer: BLUE CROSS/BLUE SHIELD | Admitting: Hematology and Oncology

## 2018-02-03 ENCOUNTER — Inpatient Hospital Stay: Payer: BLUE CROSS/BLUE SHIELD | Attending: Hematology and Oncology

## 2018-02-03 ENCOUNTER — Encounter: Payer: Self-pay | Admitting: Hematology and Oncology

## 2018-02-03 ENCOUNTER — Inpatient Hospital Stay: Payer: BLUE CROSS/BLUE SHIELD

## 2018-02-03 ENCOUNTER — Telehealth: Payer: Self-pay | Admitting: Hematology and Oncology

## 2018-02-03 DIAGNOSIS — Z95828 Presence of other vascular implants and grafts: Secondary | ICD-10-CM

## 2018-02-03 DIAGNOSIS — I429 Cardiomyopathy, unspecified: Secondary | ICD-10-CM | POA: Diagnosis not present

## 2018-02-03 DIAGNOSIS — C9190 Lymphoid leukemia, unspecified not having achieved remission: Secondary | ICD-10-CM

## 2018-02-03 DIAGNOSIS — D702 Other drug-induced agranulocytosis: Secondary | ICD-10-CM

## 2018-02-03 DIAGNOSIS — C9110 Chronic lymphocytic leukemia of B-cell type not having achieved remission: Secondary | ICD-10-CM

## 2018-02-03 DIAGNOSIS — Z5111 Encounter for antineoplastic chemotherapy: Secondary | ICD-10-CM | POA: Insufficient documentation

## 2018-02-03 DIAGNOSIS — Z79899 Other long term (current) drug therapy: Secondary | ICD-10-CM | POA: Insufficient documentation

## 2018-02-03 DIAGNOSIS — I428 Other cardiomyopathies: Secondary | ICD-10-CM

## 2018-02-03 DIAGNOSIS — Z452 Encounter for adjustment and management of vascular access device: Secondary | ICD-10-CM

## 2018-02-03 LAB — CBC WITH DIFFERENTIAL/PLATELET
BASOS ABS: 0 10*3/uL (ref 0.0–0.1)
Basophils Relative: 0 %
Eosinophils Absolute: 0 10*3/uL (ref 0.0–0.5)
Eosinophils Relative: 0 %
HEMATOCRIT: 35.5 % (ref 34.8–46.6)
HEMOGLOBIN: 11.6 g/dL (ref 11.6–15.9)
LYMPHS ABS: 1.8 10*3/uL (ref 0.9–3.3)
LYMPHS PCT: 53 %
MCH: 28.1 pg (ref 25.1–34.0)
MCHC: 32.7 g/dL (ref 31.5–36.0)
MCV: 86 fL (ref 79.5–101.0)
Monocytes Absolute: 0.6 10*3/uL (ref 0.1–0.9)
Monocytes Relative: 16 %
NEUTROS ABS: 1.1 10*3/uL — AB (ref 1.5–6.5)
NEUTROS PCT: 31 %
Platelets: 225 10*3/uL (ref 145–400)
RBC: 4.13 MIL/uL (ref 3.70–5.45)
RDW: 14.2 % (ref 11.2–14.5)
WBC: 3.4 10*3/uL — AB (ref 3.9–10.3)

## 2018-02-03 LAB — COMPREHENSIVE METABOLIC PANEL
ALK PHOS: 75 U/L (ref 40–150)
ALT: 27 U/L (ref 0–55)
AST: 26 U/L (ref 5–34)
Albumin: 4.2 g/dL (ref 3.5–5.0)
Anion gap: 9 (ref 3–11)
BUN: 8 mg/dL (ref 7–26)
CALCIUM: 10.1 mg/dL (ref 8.4–10.4)
CHLORIDE: 108 mmol/L (ref 98–109)
CO2: 24 mmol/L (ref 22–29)
Creatinine, Ser: 0.71 mg/dL (ref 0.60–1.10)
Glucose, Bld: 94 mg/dL (ref 70–140)
Potassium: 3.8 mmol/L (ref 3.5–5.1)
Sodium: 141 mmol/L (ref 136–145)
Total Bilirubin: 0.7 mg/dL (ref 0.2–1.2)
Total Protein: 6.6 g/dL (ref 6.4–8.3)

## 2018-02-03 MED ORDER — SODIUM CHLORIDE 0.9% FLUSH
10.0000 mL | Freq: Once | INTRAVENOUS | Status: AC
  Administered 2018-02-03: 10 mL
  Filled 2018-02-03: qty 10

## 2018-02-03 MED ORDER — HEPARIN SOD (PORK) LOCK FLUSH 100 UNIT/ML IV SOLN
250.0000 [IU] | Freq: Once | INTRAVENOUS | Status: AC
  Administered 2018-02-03: 10:00:00
  Filled 2018-02-03: qty 5

## 2018-02-03 NOTE — Telephone Encounter (Signed)
Gave avs and calendar ° °

## 2018-02-03 NOTE — Assessment & Plan Note (Signed)
This is likely due to recent treatment. The patient denies recent history of fevers, cough, chills, diarrhea or dysuria. She is asymptomatic from the leukopenia. I will observe for now.  I will continue the chemotherapy at current dose without dosage adjustment.  If the leukopenia gets progressive worse in the future, I might have to delay her treatment or adjust the chemotherapy dose.   

## 2018-02-03 NOTE — Assessment & Plan Note (Signed)
Examination is benign She is asymptomatic She is in sinus rhythm with good blood pressure She will continue medical management She has no signs or symptoms of congestive heart failure

## 2018-02-03 NOTE — Assessment & Plan Note (Signed)
She has remarkable response to treatment I recommend we continue on Rituximab monthly and Venetoclax 200 mg daily.   She has recently completed prednisone taper by the end of February She will continue close blood counts monitoring  I plan to repeat PET/CT scan in August after completion of Rituxan (planned 6 cycles)

## 2018-02-03 NOTE — Progress Notes (Signed)
[facility] OFFICE PROGRESS NOTE  Patient Care Team: Janith Lima, MD as PCP - General (Internal Medicine) Constance Haw, MD as Consulting Physician (Cardiology)  ASSESSMENT & PLAN:  CLL (chronic lymphocytic leukemia) ([place]) She has remarkable response to treatment I recommend we continue on Rituximab monthly and Venetoclax 200 mg daily.   She has recently completed prednisone taper by the end of February She will continue close blood counts monitoring  I plan to repeat PET/CT scan in August after completion of Rituxan (planned 6 cycles)  Drug-induced neutropenia ([place]) This is likely due to recent treatment. The patient denies recent history of fevers, cough, chills, diarrhea or dysuria. She is asymptomatic from the leukopenia. I will observe for now.  I will continue the chemotherapy at current dose without dosage adjustment.  If the leukopenia gets progressive worse in the future, I might have to delay her treatment or adjust the chemotherapy dose.  Nonischemic cardiomyopathy (HCC) Examination is benign She is asymptomatic She is in sinus rhythm with good blood pressure She will continue medical management She has no signs or symptoms of congestive heart failure   No orders of the defined types were placed in this encounter.   INTERVAL HISTORY: Please see below for problem oriented charting. She returns with her husband for further follow-up She is tolerating treatment well She denies missed doses No new lymphadenopathy Denies recent infection Denies recent exacerbation of congestive heart failure such as shortness of breath, chest pain or leg edema  SUMMARY OF ONCOLOGIC HISTORY:   CLL (chronic lymphocytic leukemia) ([place])   04/05/2015 Pathology Results    Accession: [id number] flow cytometry confirmed CLL. FISH was positive for p53 mutation      04/24/2015 Imaging    Extensive lymphadenopathy throughout the neck, chest (axilla), abdomen and pelvis,  as detailed above, compatible with the reported clinical history of lymphoma. 2. Mild splenomegaly.      05/03/2015 - 08/27/2015 Chemotherapy    She started on Ibrutinib, discontinued prematurely when her prescription ran out      10/10/2015 - 05/29/2017 Chemotherapy    She was restarted back on Ibrutinib      11/13/2016 PET scan    Significant generalized reduction in size of numerous lymph nodes in the neck, chest, abdomen, and pelvis. Previously the activity of these nodes was low-level and in general a similar low-level activity is present today, significantly less than mediastinal blood pool activity, compatible with Deauville 2. 2. Coronary atherosclerosis. Mild cardiomegaly. 3. Mildly prominent endometrium for age without accentuated metabolic activity in the endometrium. Consider pelvic sonography for further characterization.      05/27/2017 PET scan    1. Progressive hypermetabolic adenopathy, primarily involving cervical, axillary, pelvic and inguinal lymph nodes bilaterally, consistent with progressive lymphoma. 2. No solid visceral organ or osseous involvement.      06/16/2017 - 08/25/2017 Chemotherapy    The patient had 3 cycles of Rituximab and Bendamustine      07/15/2017 - 07/19/2017 Hospital Admission    The patient was briefly admitted to the hospital due to infusion reaction to rituximab      09/18/2017 PET scan    1. Continued considerable adenopathy in the neck, chest, abdomen, and pelvis. This is generally stable in size but mildly reduced in activity compared to the prior exam. Current levels of activity primarily Deauville 3 and Deauville 4. No splenomegaly. 2. Diffuse new ground-glass opacities in the lungs with associated hypermetabolic activity. Some forms of lymphoma infiltration can  rarely cause this pattern of diffuse ground-glass opacity and hypermetabolic activity. Differential diagnostic considerations might include atypical pneumonia such as mycoplasma,  acute hypersensitivity pneumonitis, or acute eosinophilic pneumonia. Pulmonary hemorrhage seems less likely to cause this degree of accentuated metabolic activity.  3. Aortic Atherosclerosis (ICD10-I70.0). Coronary atherosclerosis.      09/24/2017 -  Chemotherapy    The patient had ventoclax for chemotherapy treatment.  Rituximab is added on 11/18/17      11/19/2017 PET scan    Overall mild interval decrease in hypermetabolic lymphadenopathy throughout the neck, chest, abdomen, and pelvis. No new or increased lymphadenopathy identified.  While there has been resolution of diffuse hypermetabolic bilateral ground-glass pulmonary opacity since prior study, there is a new 14 mm hypermetabolic pulmonary nodule in the posterior right lower lobe. Time course favors inflammatory or infectious etiology over neoplasm. Recommend continued follow-up by chest CT in 3 months.       REVIEW OF SYSTEMS:   Constitutional: Denies fevers, chills or abnormal weight loss Eyes: Denies blurriness of vision Ears, nose, mouth, throat, and face: Denies mucositis or sore throat Respiratory: Denies cough, dyspnea or wheezes Cardiovascular: Denies palpitation, chest discomfort or lower extremity swelling Gastrointestinal:  Denies nausea, heartburn or change in bowel habits Skin: Denies abnormal skin rashes Lymphatics: Denies new lymphadenopathy or easy bruising Neurological:Denies numbness, tingling or new weaknesses Behavioral/Psych: Mood is stable, no new changes  All other systems were reviewed with the patient and are negative.  I have reviewed the past medical history, past surgical history, social history and family history with the patient and they are unchanged from previous note.  ALLERGIES:  has No Known Allergies.  MEDICATIONS:  Current Outpatient Medications  Medication Sig Dispense Refill  . allopurinol (ZYLOPRIM) 300 MG tablet Take 1 tablet (300 mg total) by mouth daily. 60 tablet 1  .  Cholecalciferol (VITAMIN D-3) 1000 units CAPS Take 1,000 Units by mouth daily with breakfast.    . ELIQUIS 5 MG TABS tablet TAKE 1 TABLET BY MOUTH TWICE A DAY (Patient taking differently: Take 5 mg by mouth two times a day) 180 tablet 1  . furosemide (LASIX) 40 MG tablet TAKE 1 TABLET BY MOUTH EVERY DAY (Patient taking differently: Take 40 mg by mouth once a day) 90 tablet 3  . metoprolol succinate (TOPROL-XL) 100 MG 24 hr tablet Take 1 1/2 tablets (150 mg total) daily. Take with or immediately following a meal. 135 tablet 3  . potassium chloride SA (K-DUR,KLOR-CON) 20 MEQ tablet Take 1 tablet (20 mEq total) by mouth 2 (two) times daily. 14 tablet 0  . sacubitril-valsartan (ENTRESTO) 24-26 MG Take 1 tablet by mouth 2 (two) times daily. 180 tablet 3  . venetoclax (VENCLEXTA) 100 MG TABS Take 200 mg by mouth daily. 120 tablet 9   No current facility-administered medications for this visit.     PHYSICAL EXAMINATION: ECOG PERFORMANCE STATUS: 1 - Symptomatic but completely ambulatory  Vitals:   02/03/18 0959  BP: 132/79  Pulse: 61  Resp: 18  Temp: 98.4 F (36.9 C)  SpO2: 100%   Filed Weights   02/03/18 0959  Weight: 198 lb 6.4 oz (90 kg)    GENERAL:alert, no distress and comfortable SKIN: skin color, texture, turgor are normal, no rashes or significant lesions EYES: normal, Conjunctiva are pink and non-injected, sclera clear OROPHARYNX:no exudate, no erythema and lips, buccal mucosa, and tongue normal  NECK: supple, thyroid normal size, non-tender, without nodularity LYMPH:  no palpable lymphadenopathy in the cervical, axillary or  inguinal LUNGS: clear to auscultation and percussion with normal breathing effort HEART: regular rate & rhythm and no murmurs and no lower extremity edema ABDOMEN:abdomen soft, non-tender and normal bowel sounds Musculoskeletal:no cyanosis of digits and no clubbing  NEURO: alert & oriented x 3 with fluent speech, no focal motor/sensory  deficits  LABORATORY DATA:  I have reviewed the data as listed    Component Value Date/Time   NA 141 02/03/2018 0900   NA 140 10/09/2017 1416   K 3.8 02/03/2018 0900   K 4.4 10/09/2017 1416   CL 108 02/03/2018 0900   CL 105 01/18/2013 0912   CO2 24 02/03/2018 0900   CO2 25 10/09/2017 1416   GLUCOSE 94 02/03/2018 0900   GLUCOSE 117 10/09/2017 1416   GLUCOSE 83 01/18/2013 0912   BUN 8 02/03/2018 0900   BUN 11.1 10/09/2017 1416   CREATININE 0.71 02/03/2018 0900   CREATININE 0.8 10/09/2017 1416   CALCIUM 10.1 02/03/2018 0900   CALCIUM 9.6 10/09/2017 1416   PROT 6.6 02/03/2018 0900   PROT 6.4 10/09/2017 1416   ALBUMIN 4.2 02/03/2018 0900   ALBUMIN 3.4 (L) 10/09/2017 1416   AST 26 02/03/2018 0900   AST 18 10/09/2017 1416   ALT 27 02/03/2018 0900   ALT 19 10/09/2017 1416   ALKPHOS 75 02/03/2018 0900   ALKPHOS 69 10/09/2017 1416   BILITOT 0.7 02/03/2018 0900   BILITOT 0.57 10/09/2017 1416   GFRNONAA >60 02/03/2018 0900   GFRAA >60 02/03/2018 0900    No results found for: SPEP, UPEP  Lab Results  Component Value Date   WBC 3.4 (L) 02/03/2018   NEUTROABS 1.1 (L) 02/03/2018   HGB 11.6 02/03/2018   HCT 35.5 02/03/2018   MCV 86.0 02/03/2018   PLT 225 02/03/2018      Chemistry      Component Value Date/Time   NA 141 02/03/2018 0900   NA 140 10/09/2017 1416   K 3.8 02/03/2018 0900   K 4.4 10/09/2017 1416   CL 108 02/03/2018 0900   CL 105 01/18/2013 0912   CO2 24 02/03/2018 0900   CO2 25 10/09/2017 1416   BUN 8 02/03/2018 0900   BUN 11.1 10/09/2017 1416   CREATININE 0.71 02/03/2018 0900   CREATININE 0.8 10/09/2017 1416      Component Value Date/Time   CALCIUM 10.1 02/03/2018 0900   CALCIUM 9.6 10/09/2017 1416   ALKPHOS 75 02/03/2018 0900   ALKPHOS 69 10/09/2017 1416   AST 26 02/03/2018 0900   AST 18 10/09/2017 1416   ALT 27 02/03/2018 0900   ALT 19 10/09/2017 1416   BILITOT 0.7 02/03/2018 0900   BILITOT 0.57 10/09/2017 1416     All questions were  answered. The patient knows to call the clinic with any problems, questions or concerns. No barriers to learning was detected.  I spent 15 minutes counseling the patient face to face. The total time spent in the appointment was 20 minutes and more than 50% was on counseling and review of test results  Heath Lark, MD 02/03/2018 11:30 AM

## 2018-02-10 ENCOUNTER — Inpatient Hospital Stay: Payer: BLUE CROSS/BLUE SHIELD

## 2018-02-10 ENCOUNTER — Ambulatory Visit: Payer: Self-pay

## 2018-02-10 VITALS — BP 110/83 | HR 58 | Temp 97.7°F | Resp 18

## 2018-02-10 DIAGNOSIS — I429 Cardiomyopathy, unspecified: Secondary | ICD-10-CM | POA: Diagnosis not present

## 2018-02-10 DIAGNOSIS — Z5111 Encounter for antineoplastic chemotherapy: Secondary | ICD-10-CM | POA: Diagnosis not present

## 2018-02-10 DIAGNOSIS — Z79899 Other long term (current) drug therapy: Secondary | ICD-10-CM | POA: Diagnosis not present

## 2018-02-10 DIAGNOSIS — D702 Other drug-induced agranulocytosis: Secondary | ICD-10-CM | POA: Diagnosis not present

## 2018-02-10 DIAGNOSIS — C9110 Chronic lymphocytic leukemia of B-cell type not having achieved remission: Secondary | ICD-10-CM

## 2018-02-10 MED ORDER — SODIUM CHLORIDE 0.9 % IV SOLN
Freq: Once | INTRAVENOUS | Status: AC
  Administered 2018-02-10: 09:00:00 via INTRAVENOUS

## 2018-02-10 MED ORDER — FAMOTIDINE IN NACL 20-0.9 MG/50ML-% IV SOLN
INTRAVENOUS | Status: AC
  Filled 2018-02-10: qty 50

## 2018-02-10 MED ORDER — ACETAMINOPHEN 325 MG PO TABS
ORAL_TABLET | ORAL | Status: AC
Start: 2018-02-10 — End: ?
  Filled 2018-02-10: qty 2

## 2018-02-10 MED ORDER — DIPHENHYDRAMINE HCL 25 MG PO CAPS
ORAL_CAPSULE | ORAL | Status: AC
  Filled 2018-02-10: qty 2

## 2018-02-10 MED ORDER — FAMOTIDINE IN NACL 20-0.9 MG/50ML-% IV SOLN
20.0000 mg | Freq: Once | INTRAVENOUS | Status: AC
  Administered 2018-02-10: 20 mg via INTRAVENOUS

## 2018-02-10 MED ORDER — RITUXIMAB CHEMO INJECTION 500 MG/50ML
375.0000 mg/m2 | Freq: Once | INTRAVENOUS | Status: AC
  Administered 2018-02-10: 800 mg via INTRAVENOUS
  Filled 2018-02-10: qty 50

## 2018-02-10 MED ORDER — DIPHENHYDRAMINE HCL 25 MG PO CAPS
50.0000 mg | ORAL_CAPSULE | Freq: Once | ORAL | Status: AC
  Administered 2018-02-10: 50 mg via ORAL

## 2018-02-10 MED ORDER — HEPARIN SOD (PORK) LOCK FLUSH 100 UNIT/ML IV SOLN
250.0000 [IU] | Freq: Once | INTRAVENOUS | Status: AC | PRN
  Administered 2018-02-10: 250 [IU]
  Filled 2018-02-10: qty 5

## 2018-02-10 MED ORDER — SODIUM CHLORIDE 0.9 % IV SOLN: 375.0000 mg/m2 | Freq: Once | INTRAVENOUS | Status: DC

## 2018-02-10 MED ORDER — ACETAMINOPHEN 325 MG PO TABS
650.0000 mg | ORAL_TABLET | Freq: Once | ORAL | Status: AC
  Administered 2018-02-10: 650 mg via ORAL

## 2018-02-10 MED ORDER — SODIUM CHLORIDE 0.9% FLUSH
10.0000 mL | INTRAVENOUS | Status: DC | PRN
  Administered 2018-02-10: 10 mL
  Filled 2018-02-10: qty 10

## 2018-02-10 NOTE — Progress Notes (Signed)
02/10/18 @ 0910  Spoke with Dr Alvy Bimler this morning per her request from 01/06/18 to contact her with May treatment to discuss ability to change to Rapid Infusion Rituxan.  In reviewing January 13, 2018 dose patient had successful infusion of Rituxan with no issues.   Dr Alvy Bimler instructed to change infusion to Rapid Infusion Rituxan with monitoring parameters that are in place.  Treatment plan adjusted accordingly and RN in infusion room informed.  T.O. Dr. Lesli Albee, PharmD

## 2018-02-10 NOTE — Patient Instructions (Signed)
[facility] Discharge Instructions for Patients Receiving Chemotherapy  Today you received the following chemotherapy agent: Rituxan   To help prevent nausea and vomiting after your treatment, we encourage you to take your nausea medication as directed. If you develop nausea and vomiting that is not controlled by your nausea medication, call the clinic.   BELOW ARE SYMPTOMS THAT SHOULD BE REPORTED IMMEDIATELY:  *FEVER GREATER THAN 100.5 F  *CHILLS WITH OR WITHOUT FEVER  NAUSEA AND VOMITING THAT IS NOT CONTROLLED WITH YOUR NAUSEA MEDICATION  *UNUSUAL SHORTNESS OF BREATH  *UNUSUAL BRUISING OR BLEEDING  TENDERNESS IN MOUTH AND THROAT WITH OR WITHOUT PRESENCE OF ULCERS  *URINARY PROBLEMS  *BOWEL PROBLEMS  UNUSUAL RASH Items with * indicate a potential emergency and should be followed up as soon as possible.  Feel free to call the clinic you have any questions or concerns. The clinic phone number is [phone].

## 2018-02-16 ENCOUNTER — Ambulatory Visit (HOSPITAL_COMMUNITY)
Admission: RE | Admit: 2018-02-16 | Discharge: 2018-02-16 | Disposition: A | Discharge status: Home / Self Care | Payer: BLUE CROSS/BLUE SHIELD | Source: Ambulatory Visit | Attending: Hematology and Oncology | Admitting: Hematology and Oncology

## 2018-02-16 DIAGNOSIS — E049 Nontoxic goiter, unspecified: Secondary | ICD-10-CM | POA: Insufficient documentation

## 2018-02-16 DIAGNOSIS — I700 Atherosclerosis of aorta: Secondary | ICD-10-CM | POA: Diagnosis not present

## 2018-02-16 DIAGNOSIS — I2510 Atherosclerotic heart disease of native coronary artery without angina pectoris: Secondary | ICD-10-CM | POA: Diagnosis not present

## 2018-02-16 DIAGNOSIS — C9190 Lymphoid leukemia, unspecified not having achieved remission: Secondary | ICD-10-CM | POA: Diagnosis not present

## 2018-02-16 DIAGNOSIS — I517 Cardiomegaly: Secondary | ICD-10-CM | POA: Diagnosis not present

## 2018-02-16 DIAGNOSIS — C9110 Chronic lymphocytic leukemia of B-cell type not having achieved remission: Secondary | ICD-10-CM | POA: Diagnosis not present

## 2018-02-16 DIAGNOSIS — R911 Solitary pulmonary nodule: Secondary | ICD-10-CM | POA: Insufficient documentation

## 2018-02-16 LAB — GLUCOSE, CAPILLARY: GLUCOSE-CAPILLARY: 105 mg/dL — AB (ref 65–99)

## 2018-02-16 MED ORDER — FLUDEOXYGLUCOSE F - 18 (FDG) INJECTION: 9.9000 | Freq: Once | INTRAVENOUS | Status: DC | PRN

## 2018-02-17 ENCOUNTER — Inpatient Hospital Stay (HOSPITAL_BASED_OUTPATIENT_CLINIC_OR_DEPARTMENT_OTHER): Payer: BLUE CROSS/BLUE SHIELD | Admitting: Hematology and Oncology

## 2018-02-17 ENCOUNTER — Telehealth: Payer: Self-pay | Admitting: *Deleted

## 2018-02-17 ENCOUNTER — Other Ambulatory Visit: Payer: Self-pay | Admitting: Hematology and Oncology

## 2018-02-17 ENCOUNTER — Inpatient Hospital Stay: Payer: BLUE CROSS/BLUE SHIELD

## 2018-02-17 ENCOUNTER — Encounter: Payer: Self-pay | Admitting: Hematology and Oncology

## 2018-02-17 DIAGNOSIS — Z79899 Other long term (current) drug therapy: Secondary | ICD-10-CM

## 2018-02-17 DIAGNOSIS — I428 Other cardiomyopathies: Secondary | ICD-10-CM

## 2018-02-17 DIAGNOSIS — D702 Other drug-induced agranulocytosis: Secondary | ICD-10-CM | POA: Diagnosis not present

## 2018-02-17 DIAGNOSIS — Z5111 Encounter for antineoplastic chemotherapy: Secondary | ICD-10-CM | POA: Diagnosis not present

## 2018-02-17 DIAGNOSIS — C9110 Chronic lymphocytic leukemia of B-cell type not having achieved remission: Secondary | ICD-10-CM

## 2018-02-17 DIAGNOSIS — I429 Cardiomyopathy, unspecified: Secondary | ICD-10-CM | POA: Diagnosis not present

## 2018-02-17 DIAGNOSIS — Z95828 Presence of other vascular implants and grafts: Secondary | ICD-10-CM

## 2018-02-17 DIAGNOSIS — C9190 Lymphoid leukemia, unspecified not having achieved remission: Secondary | ICD-10-CM | POA: Diagnosis not present

## 2018-02-17 DIAGNOSIS — Z452 Encounter for adjustment and management of vascular access device: Secondary | ICD-10-CM

## 2018-02-17 LAB — CBC WITH DIFFERENTIAL/PLATELET
BASOS ABS: 0 10*3/uL (ref 0.0–0.1)
BASOS PCT: 1 %
EOS ABS: 0 10*3/uL (ref 0.0–0.5)
Eosinophils Relative: 0 %
HEMATOCRIT: 34.6 % — AB (ref 34.8–46.6)
HEMOGLOBIN: 11.4 g/dL — AB (ref 11.6–15.9)
Lymphocytes Relative: 41 %
Lymphs Abs: 1.1 10*3/uL (ref 0.9–3.3)
MCH: 28.3 pg (ref 25.1–34.0)
MCHC: 33 g/dL (ref 31.5–36.0)
MCV: 85.7 fL (ref 79.5–101.0)
Monocytes Absolute: 0.3 10*3/uL (ref 0.1–0.9)
Monocytes Relative: 13 %
NEUTROS ABS: 1.2 10*3/uL — AB (ref 1.5–6.5)
Neutrophils Relative %: 45 %
Platelets: 231 10*3/uL (ref 145–400)
RBC: 4.03 MIL/uL (ref 3.70–5.45)
RDW: 15 % — ABNORMAL HIGH (ref 11.2–14.5)
WBC: 2.6 10*3/uL — ABNORMAL LOW (ref 3.9–10.3)

## 2018-02-17 LAB — COMPREHENSIVE METABOLIC PANEL
ALBUMIN: 3.9 g/dL (ref 3.5–5.0)
ALK PHOS: 75 U/L (ref 40–150)
ALT: 27 U/L (ref 0–55)
AST: 24 U/L (ref 5–34)
Anion gap: 8 (ref 3–11)
BILIRUBIN TOTAL: 0.8 mg/dL (ref 0.2–1.2)
BUN: 9 mg/dL (ref 7–26)
CALCIUM: 9.3 mg/dL (ref 8.4–10.4)
CO2: 25 mmol/L (ref 22–29)
CREATININE: 0.72 mg/dL (ref 0.60–1.10)
Chloride: 111 mmol/L — ABNORMAL HIGH (ref 98–109)
GFR calc Af Amer: >60 mL/min (ref >60)
GFR calc non Af Amer: >60 mL/min (ref >60)
GLUCOSE: 120 mg/dL (ref 70–140)
Potassium: 3 mmol/L — CL (ref 3.5–5.1)
SODIUM: 144 mmol/L (ref 136–145)
Total Protein: 6.2 g/dL — ABNORMAL LOW (ref 6.4–8.3)

## 2018-02-17 MED ORDER — HEPARIN SOD (PORK) LOCK FLUSH 100 UNIT/ML IV SOLN
500.0000 [IU] | Freq: Once | INTRAVENOUS | Status: AC
  Administered 2018-02-17: 500 [IU]
  Filled 2018-02-17: qty 5

## 2018-02-17 MED ORDER — SODIUM CHLORIDE 0.9% FLUSH
10.0000 mL | Freq: Once | INTRAVENOUS | Status: AC
  Administered 2018-02-17: 10 mL
  Filled 2018-02-17: qty 10

## 2018-02-17 NOTE — Assessment & Plan Note (Signed)
This is likely due to recent treatment. The patient denies recent history of fevers, cough, chills, diarrhea or dysuria. She is asymptomatic from the leukopenia. I will observe for now.  I will continue the chemotherapy at current dose without dosage adjustment.  If the leukopenia gets progressive worse in the future, I might have to delay her treatment or adjust the chemotherapy dose.   

## 2018-02-17 NOTE — Assessment & Plan Note (Signed)
She has remarkable response to treatment PET CT scan from 02/16/2018 show near complete response to treatment I recommend we continue on Rituximab monthly and Venetoclax 200 mg daily.   She has recently completed prednisone taper by the end of February She will continue close blood counts monitoring  I plan to repeat PET/CT scan in August again after completion of Rituxan (planned 6 cycles)

## 2018-02-17 NOTE — Progress Notes (Signed)
[facility] OFFICE PROGRESS NOTE  Patient Care Team: Janith Lima, MD as PCP - General (Internal Medicine) Constance Haw, MD as Consulting Physician (Cardiology)  ASSESSMENT & PLAN:  CLL (chronic lymphocytic leukemia) ([place]) She has remarkable response to treatment PET CT scan from 02/16/2018 show near complete response to treatment I recommend we continue on Rituximab monthly and Venetoclax 200 mg daily.   She has recently completed prednisone taper by the end of February She will continue close blood counts monitoring  I plan to repeat PET/CT scan in August again after completion of Rituxan (planned 6 cycles)  Nonischemic cardiomyopathy ([place]) Examination is benign She is asymptomatic She is in sinus rhythm with good blood pressure She will continue medical management She has no signs or symptoms of congestive heart failure  Drug-induced neutropenia ([place]) This is likely due to recent treatment. The patient denies recent history of fevers, cough, chills, diarrhea or dysuria. She is asymptomatic from the leukopenia. I will observe for now.  I will continue the chemotherapy at current dose without dosage adjustment.  If the leukopenia gets progressive worse in the future, I might have to delay her treatment or adjust the chemotherapy dose.   No orders of the defined types were placed in this encounter.   INTERVAL HISTORY: Please see below for problem oriented charting. She returns with her husband for further follow-up She tolerated treatment well No recent infection No new lymphadenopathy No recent exacerbation of congestive heart failure  SUMMARY OF ONCOLOGIC HISTORY:   CLL (chronic lymphocytic leukemia) ([place])   04/05/2015 Pathology Results    Accession: [id number] flow cytometry confirmed CLL. FISH was positive for p53 mutation      04/24/2015 Imaging    Extensive lymphadenopathy throughout the neck, chest (axilla), abdomen and pelvis, as detailed  above, compatible with the reported clinical history of lymphoma. 2. Mild splenomegaly.      05/03/2015 - 08/27/2015 Chemotherapy    She started on Ibrutinib, discontinued prematurely when her prescription ran out      10/10/2015 - 05/29/2017 Chemotherapy    She was restarted back on Ibrutinib      11/13/2016 PET scan    Significant generalized reduction in size of numerous lymph nodes in the neck, chest, abdomen, and pelvis. Previously the activity of these nodes was low-level and in general a similar low-level activity is present today, significantly less than mediastinal blood pool activity, compatible with Deauville 2. 2. Coronary atherosclerosis. Mild cardiomegaly. 3. Mildly prominent endometrium for age without accentuated metabolic activity in the endometrium. Consider pelvic sonography for further characterization.      05/27/2017 PET scan    1. Progressive hypermetabolic adenopathy, primarily involving cervical, axillary, pelvic and inguinal lymph nodes bilaterally, consistent with progressive lymphoma. 2. No solid visceral organ or osseous involvement.      06/16/2017 - 08/25/2017 Chemotherapy    The patient had 3 cycles of Rituximab and Bendamustine      07/15/2017 - 07/19/2017 Hospital Admission    The patient was briefly admitted to the hospital due to infusion reaction to rituximab      09/18/2017 PET scan    1. Continued considerable adenopathy in the neck, chest, abdomen, and pelvis. This is generally stable in size but mildly reduced in activity compared to the prior exam. Current levels of activity primarily Deauville 3 and Deauville 4. No splenomegaly. 2. Diffuse new ground-glass opacities in the lungs with associated hypermetabolic activity. Some forms of lymphoma infiltration can rarely cause this  pattern of diffuse ground-glass opacity and hypermetabolic activity. Differential diagnostic considerations might include atypical pneumonia such as mycoplasma, acute  hypersensitivity pneumonitis, or acute eosinophilic pneumonia. Pulmonary hemorrhage seems less likely to cause this degree of accentuated metabolic activity.  3. Aortic Atherosclerosis (ICD10-I70.0). Coronary atherosclerosis.      09/24/2017 -  Chemotherapy    The patient had ventoclax for chemotherapy treatment.  Rituximab is added on 11/18/17      11/19/2017 PET scan    Overall mild interval decrease in hypermetabolic lymphadenopathy throughout the neck, chest, abdomen, and pelvis. No new or increased lymphadenopathy identified.  While there has been resolution of diffuse hypermetabolic bilateral ground-glass pulmonary opacity since prior study, there is a new 14 mm hypermetabolic pulmonary nodule in the posterior right lower lobe. Time course favors inflammatory or infectious etiology over neoplasm. Recommend continued follow-up by chest CT in 3 months.      02/16/2018 PET scan    1. Adenopathy in the neck, chest, and pelvis is stable to minimally reduced in size, and is moderately reduced in activity, primarily Deauville 2 disease today. There is a right common iliac lymph node qualifying as Deauville 3 disease which is stable in size but reduced in activity. 2. Stable size but reduced activity in a pulmonary nodule in the right lower lobe. If this represents a leukemic lesion then a corresponds to Deauville 4 disease. This lesion was not readily apparent on 09/22/2017 but was reported on the prior chest CT of 11/19/2017. 3. Other imaging findings of potential clinical significance: Mild thyroid goiter. Aortic Atherosclerosis (ICD10-I70.0). Coronary atherosclerosis. Mild cardiomegaly.       REVIEW OF SYSTEMS:   Constitutional: Denies fevers, chills or abnormal weight loss Eyes: Denies blurriness of vision Ears, nose, mouth, throat, and face: Denies mucositis or sore throat Respiratory: Denies cough, dyspnea or wheezes Cardiovascular: Denies palpitation, chest discomfort or lower  extremity swelling Gastrointestinal:  Denies nausea, heartburn or change in bowel habits Skin: Denies abnormal skin rashes Lymphatics: Denies new lymphadenopathy or easy bruising Neurological:Denies numbness, tingling or new weaknesses Behavioral/Psych: Mood is stable, no new changes  All other systems were reviewed with the patient and are negative.  I have reviewed the past medical history, past surgical history, social history and family history with the patient and they are unchanged from previous note.  ALLERGIES:  has No Known Allergies.  MEDICATIONS:  Current Outpatient Medications  Medication Sig Dispense Refill  . allopurinol (ZYLOPRIM) 300 MG tablet Take 1 tablet (300 mg total) by mouth daily. 60 tablet 1  . Cholecalciferol (VITAMIN D-3) 1000 units CAPS Take 1,000 Units by mouth daily with breakfast.    . ELIQUIS 5 MG TABS tablet TAKE 1 TABLET BY MOUTH TWICE A DAY (Patient taking differently: Take 5 mg by mouth two times a day) 180 tablet 1  . furosemide (LASIX) 40 MG tablet TAKE 1 TABLET BY MOUTH EVERY DAY (Patient taking differently: Take 40 mg by mouth once a day) 90 tablet 3  . metoprolol succinate (TOPROL-XL) 100 MG 24 hr tablet Take 1 1/2 tablets (150 mg total) daily. Take with or immediately following a meal. 135 tablet 3  . potassium chloride SA (K-DUR,KLOR-CON) 20 MEQ tablet Take 1 tablet (20 mEq total) by mouth 2 (two) times daily. 14 tablet 0  . sacubitril-valsartan (ENTRESTO) 24-26 MG Take 1 tablet by mouth 2 (two) times daily. 180 tablet 3  . venetoclax (VENCLEXTA) 100 MG TABS Take 200 mg by mouth daily. 120 tablet 9   No  current facility-administered medications for this visit.    Facility-Administered Medications Ordered in Other Visits  Medication Dose Route Frequency Provider Last Rate Last Dose  . fludeoxyglucose F - 18 (FDG) injection 9.9 millicurie  9.9 millicurie Intravenous Once PRN Rolm Baptise, MD        PHYSICAL EXAMINATION: ECOG PERFORMANCE STATUS:  0 - Asymptomatic  Vitals:   02/17/18 0940  BP: 127/74  Pulse: (!) 59  Resp: 18  Temp: 97.8 F (36.6 C)  SpO2: 100%   Filed Weights   02/17/18 0940  Weight: 200 lb 8 oz (90.9 kg)    GENERAL:alert, no distress and comfortable SKIN: skin color, texture, turgor are normal, no rashes or significant lesions EYES: normal, Conjunctiva are pink and non-injected, sclera clear OROPHARYNX:no exudate, no erythema and lips, buccal mucosa, and tongue normal  NECK: supple, thyroid normal size, non-tender, without nodularity LYMPH:  no palpable lymphadenopathy in the cervical, axillary or inguinal LUNGS: clear to auscultation and percussion with normal breathing effort HEART: regular rate & rhythm and no murmurs and no lower extremity edema ABDOMEN:abdomen soft, non-tender and normal bowel sounds Musculoskeletal:no cyanosis of digits and no clubbing  NEURO: alert & oriented x 3 with fluent speech, no focal motor/sensory deficits  LABORATORY DATA:  I have reviewed the data as listed    Component Value Date/Time   NA 141 02/03/2018 0900   NA 140 10/09/2017 1416   K 3.8 02/03/2018 0900   K 4.4 10/09/2017 1416   CL 108 02/03/2018 0900   CL 105 01/18/2013 0912   CO2 24 02/03/2018 0900   CO2 25 10/09/2017 1416   GLUCOSE 94 02/03/2018 0900   GLUCOSE 117 10/09/2017 1416   GLUCOSE 83 01/18/2013 0912   BUN 8 02/03/2018 0900   BUN 11.1 10/09/2017 1416   CREATININE 0.71 02/03/2018 0900   CREATININE 0.8 10/09/2017 1416   CALCIUM 10.1 02/03/2018 0900   CALCIUM 9.6 10/09/2017 1416   PROT 6.6 02/03/2018 0900   PROT 6.4 10/09/2017 1416   ALBUMIN 4.2 02/03/2018 0900   ALBUMIN 3.4 (L) 10/09/2017 1416   AST 26 02/03/2018 0900   AST 18 10/09/2017 1416   ALT 27 02/03/2018 0900   ALT 19 10/09/2017 1416   ALKPHOS 75 02/03/2018 0900   ALKPHOS 69 10/09/2017 1416   BILITOT 0.7 02/03/2018 0900   BILITOT 0.57 10/09/2017 1416   GFRNONAA >60 02/03/2018 0900   GFRAA >60 02/03/2018 0900    No results  found for: SPEP, UPEP  Lab Results  Component Value Date   WBC 2.6 (L) 02/17/2018   NEUTROABS 1.2 (L) 02/17/2018   HGB 11.4 (L) 02/17/2018   HCT 34.6 (L) 02/17/2018   MCV 85.7 02/17/2018   PLT 231 02/17/2018      Chemistry      Component Value Date/Time   NA 141 02/03/2018 0900   NA 140 10/09/2017 1416   K 3.8 02/03/2018 0900   K 4.4 10/09/2017 1416   CL 108 02/03/2018 0900   CL 105 01/18/2013 0912   CO2 24 02/03/2018 0900   CO2 25 10/09/2017 1416   BUN 8 02/03/2018 0900   BUN 11.1 10/09/2017 1416   CREATININE 0.71 02/03/2018 0900   CREATININE 0.8 10/09/2017 1416      Component Value Date/Time   CALCIUM 10.1 02/03/2018 0900   CALCIUM 9.6 10/09/2017 1416   ALKPHOS 75 02/03/2018 0900   ALKPHOS 69 10/09/2017 1416   AST 26 02/03/2018 0900   AST 18 10/09/2017 1416   ALT  27 02/03/2018 0900   ALT 19 10/09/2017 1416   BILITOT 0.7 02/03/2018 0900   BILITOT 0.57 10/09/2017 1416       RADIOGRAPHIC STUDIES: I have reviewed multiple imaging study with the patient and husband I have personally reviewed the radiological images as listed and agreed with the findings in the report. Nm Pet Image Restag (ps) Skull Base To Thigh  Result Date: 02/16/2018 CLINICAL DATA:  Subsequent treatment strategy for chronic lymphocytic leukemia. EXAM: NUCLEAR MEDICINE PET SKULL BASE TO THIGH TECHNIQUE: 9.9 mCi F-18 FDG was injected intravenously. Full-ring PET imaging was performed from the skull base to thigh after the radiotracer. CT data was obtained and used for attenuation correction and anatomic localization. Fasting blood glucose: 105 mg/dl COMPARISON:  11/19/2017 FINDINGS: Mediastinal blood pool activity: SUV max 2.4 Background hepatic uptake: 3.7 NECK: Previous physiologic glottic activity is much less intense on today's exam. Scattered small level IIa, level IIb, and level III lymph nodes in the neck are present; an index level IIb lymph node measuring 1.1 cm in short axis on image 25/4  (formerly the same) has a maximum SUV of 2.0 (formerly 2.9). This corresponds to Deauville 2 disease. Incidental CT findings: Diffuse mild prominence of the thyroid gland without hypermetabolic activity. CHEST: Bilateral axillary, subpectoral, supraclavicular, and right paratracheal lymph nodes are again identified. Index left axillary lymph node measuring 1.4 cm in short axis on image 58/4 (previously 1.6 cm) has a maximum standard uptake value of 1.9 (formerly 3.2). This corresponds to Deauville 2 disease. Posteriorly in the right lower lobe there is a 1.6 by 1.1 cm pulmonary nodule on image 76/4 (formerly 1.5 by 1.1 cm by my measurements) with maximum SUV 4.2 (formerly 6.2). If this represents a leukemic lesion then this represents Deauville 4 disease. Incidental CT findings: Right central line tip: Cavoatrial junction. Three-vessel coronary atherosclerotic calcification with mild cardiomegaly. ABDOMEN/PELVIS: Right common iliac node 1.2 cm in short axis on image 144/4 (stable), maximum SUV 2.5 (formerly 3.4). This represents Deauville 3 disease. Bilateral operator and external iliac adenopathy is observed, index right operator lymph node one point 2 cm in short axis on image 160/4 (formerly the same by my measurement) with maximum SUV 2.2 (formerly 3.1). This represents Deauville 2 disease. Incidental CT findings: No splenomegaly. Aortoiliac atherosclerotic vascular disease. SKELETON: No significant abnormal hypermetabolic activity in this region. Incidental CT findings: Mild sclerosis along the pubic bodies. Lower lumbar degenerative facet arthropathy. IMPRESSION: 1. Adenopathy in the neck, chest, and pelvis is stable to minimally reduced in size, and is moderately reduced in activity, primarily Deauville 2 disease today. There is a right common iliac lymph node qualifying as Deauville 3 disease which is stable in size but reduced in activity. 2. Stable size but reduced activity in a pulmonary nodule in the  right lower lobe. If this represents a leukemic lesion then a corresponds to Deauville 4 disease. This lesion was not readily apparent on 09/22/2017 but was reported on the prior chest CT of 11/19/2017. 3. Other imaging findings of potential clinical significance: Mild thyroid goiter. Aortic Atherosclerosis (ICD10-I70.0). Coronary atherosclerosis. Mild cardiomegaly. Electronically Signed   By: Van Clines M.D.   On: 02/16/2018 10:00    All questions were answered. The patient knows to call the clinic with any problems, questions or concerns. No barriers to learning was detected.  I spent 15 minutes counseling the patient face to face. The total time spent in the appointment was 20 minutes and more than 50% was on counseling  and review of test results  Heath Lark, MD 02/17/2018 9:51 AM

## 2018-02-17 NOTE — Assessment & Plan Note (Signed)
Examination is benign She is asymptomatic She is in sinus rhythm with good blood pressure She will continue medical management She has no signs or symptoms of congestive heart failure

## 2018-02-17 NOTE — Telephone Encounter (Signed)
Notified that potassium is low. Dr Alvy Bimler wants her to increase the potassium in her diet. Reviewed foods that are high in potassium with patient.

## 2018-02-17 NOTE — Telephone Encounter (Signed)
Called patient to let her know potassium is low at 3.0. Dr Alvy Bimler wants her to increase her potassium in her diet

## 2018-02-19 MED FILL — VENCLEXTA 100 MG TABS: 100 | 30 days supply | Qty: 120 | Fill #1

## 2018-02-24 ENCOUNTER — Inpatient Hospital Stay: Payer: BLUE CROSS/BLUE SHIELD

## 2018-02-24 DIAGNOSIS — Z79899 Other long term (current) drug therapy: Secondary | ICD-10-CM | POA: Diagnosis not present

## 2018-02-24 DIAGNOSIS — C9110 Chronic lymphocytic leukemia of B-cell type not having achieved remission: Secondary | ICD-10-CM

## 2018-02-24 DIAGNOSIS — Z95828 Presence of other vascular implants and grafts: Secondary | ICD-10-CM

## 2018-02-24 DIAGNOSIS — Z452 Encounter for adjustment and management of vascular access device: Secondary | ICD-10-CM

## 2018-02-24 DIAGNOSIS — D702 Other drug-induced agranulocytosis: Secondary | ICD-10-CM | POA: Diagnosis not present

## 2018-02-24 DIAGNOSIS — Z5111 Encounter for antineoplastic chemotherapy: Secondary | ICD-10-CM | POA: Diagnosis not present

## 2018-02-24 DIAGNOSIS — I429 Cardiomyopathy, unspecified: Secondary | ICD-10-CM | POA: Diagnosis not present

## 2018-02-24 LAB — CBC WITH DIFFERENTIAL/PLATELET
Basophils Absolute: 0 10*3/uL (ref 0.0–0.1)
Basophils Relative: 1 %
EOS PCT: 0 %
Eosinophils Absolute: 0 10*3/uL (ref 0.0–0.5)
HCT: 34.3 % — ABNORMAL LOW (ref 34.8–46.6)
Hemoglobin: 11.2 g/dL — ABNORMAL LOW (ref 11.6–15.9)
Lymphocytes Relative: 41 %
Lymphs Abs: 1.1 10*3/uL (ref 0.9–3.3)
MCH: 28.2 pg (ref 25.1–34.0)
MCHC: 32.6 g/dL (ref 31.5–36.0)
MCV: 86.3 fL (ref 79.5–101.0)
Monocytes Absolute: 0.5 10*3/uL (ref 0.1–0.9)
Monocytes Relative: 20 %
NEUTROS PCT: 38 %
Neutro Abs: 1 10*3/uL — ABNORMAL LOW (ref 1.5–6.5)
PLATELETS: 232 10*3/uL (ref 145–400)
RBC: 3.97 MIL/uL (ref 3.70–5.45)
RDW: 14.9 % — AB (ref 11.2–14.5)
WBC: 2.7 10*3/uL — AB (ref 3.9–10.3)

## 2018-02-24 LAB — COMPREHENSIVE METABOLIC PANEL
ALT: 25 U/L (ref 0–55)
AST: 22 U/L (ref 5–34)
Albumin: 4 g/dL (ref 3.5–5.0)
Alkaline Phosphatase: 81 U/L (ref 40–150)
Anion gap: 9 (ref 3–11)
BUN: 9 mg/dL (ref 7–26)
CO2: 25 mmol/L (ref 22–29)
Calcium: 9.6 mg/dL (ref 8.4–10.4)
Chloride: 109 mmol/L (ref 98–109)
Creatinine, Ser: 0.76 mg/dL (ref 0.60–1.10)
Glucose, Bld: 120 mg/dL (ref 70–140)
POTASSIUM: 3.3 mmol/L — AB (ref 3.5–5.1)
Sodium: 143 mmol/L (ref 136–145)
Total Bilirubin: 0.7 mg/dL (ref 0.2–1.2)
Total Protein: 6.3 g/dL — ABNORMAL LOW (ref 6.4–8.3)

## 2018-02-24 MED ORDER — SODIUM CHLORIDE 0.9% FLUSH
10.0000 mL | Freq: Once | INTRAVENOUS | Status: AC
  Administered 2018-02-24: 10 mL
  Filled 2018-02-24: qty 10

## 2018-02-24 MED ORDER — HEPARIN SOD (PORK) LOCK FLUSH 100 UNIT/ML IV SOLN
250.0000 [IU] | Freq: Once | INTRAVENOUS | Status: AC
  Administered 2018-02-24: 250 [IU]
  Filled 2018-02-24: qty 5

## 2018-03-03 ENCOUNTER — Encounter: Payer: Self-pay | Admitting: Hematology and Oncology

## 2018-03-03 ENCOUNTER — Inpatient Hospital Stay (HOSPITAL_BASED_OUTPATIENT_CLINIC_OR_DEPARTMENT_OTHER): Payer: BLUE CROSS/BLUE SHIELD | Admitting: Hematology and Oncology

## 2018-03-03 ENCOUNTER — Inpatient Hospital Stay: Payer: BLUE CROSS/BLUE SHIELD

## 2018-03-03 ENCOUNTER — Other Ambulatory Visit: Payer: Self-pay | Admitting: Cardiovascular Disease

## 2018-03-03 ENCOUNTER — Telehealth: Payer: Self-pay | Admitting: *Deleted

## 2018-03-03 DIAGNOSIS — C9110 Chronic lymphocytic leukemia of B-cell type not having achieved remission: Secondary | ICD-10-CM

## 2018-03-03 DIAGNOSIS — Z79899 Other long term (current) drug therapy: Secondary | ICD-10-CM | POA: Diagnosis not present

## 2018-03-03 DIAGNOSIS — I428 Other cardiomyopathies: Secondary | ICD-10-CM

## 2018-03-03 DIAGNOSIS — D702 Other drug-induced agranulocytosis: Secondary | ICD-10-CM | POA: Diagnosis not present

## 2018-03-03 DIAGNOSIS — C9190 Lymphoid leukemia, unspecified not having achieved remission: Secondary | ICD-10-CM

## 2018-03-03 DIAGNOSIS — Z452 Encounter for adjustment and management of vascular access device: Secondary | ICD-10-CM

## 2018-03-03 DIAGNOSIS — I429 Cardiomyopathy, unspecified: Secondary | ICD-10-CM | POA: Diagnosis not present

## 2018-03-03 DIAGNOSIS — Z95828 Presence of other vascular implants and grafts: Secondary | ICD-10-CM

## 2018-03-03 DIAGNOSIS — Z5111 Encounter for antineoplastic chemotherapy: Secondary | ICD-10-CM | POA: Diagnosis not present

## 2018-03-03 LAB — COMPREHENSIVE METABOLIC PANEL
ALK PHOS: 86 U/L (ref 40–150)
ALT: 29 U/L (ref 0–55)
ANION GAP: 13 — AB (ref 3–11)
AST: 29 U/L (ref 5–34)
Albumin: 4.3 g/dL (ref 3.5–5.0)
BUN: 11 mg/dL (ref 7–26)
CALCIUM: 9.5 mg/dL (ref 8.4–10.4)
CO2: 23 mmol/L (ref 22–29)
Chloride: 108 mmol/L (ref 98–109)
Creatinine, Ser: 0.76 mg/dL (ref 0.60–1.10)
GFR calc non Af Amer: >60 mL/min (ref >60)
Glucose, Bld: 103 mg/dL (ref 70–140)
Potassium: 3.6 mmol/L (ref 3.5–5.1)
SODIUM: 144 mmol/L (ref 136–145)
Total Bilirubin: 0.8 mg/dL (ref 0.2–1.2)
Total Protein: 6.9 g/dL (ref 6.4–8.3)

## 2018-03-03 LAB — CBC WITH DIFFERENTIAL/PLATELET
Basophils Absolute: 0 10*3/uL (ref 0.0–0.1)
Basophils Relative: 0 %
EOS ABS: 0 10*3/uL (ref 0.0–0.5)
EOS PCT: 0 %
HCT: 36.6 % (ref 34.8–46.6)
HEMOGLOBIN: 12.1 g/dL (ref 11.6–15.9)
LYMPHS ABS: 1.4 10*3/uL (ref 0.9–3.3)
Lymphocytes Relative: 43 %
MCH: 28.6 pg (ref 25.1–34.0)
MCHC: 33.1 g/dL (ref 31.5–36.0)
MCV: 86.5 fL (ref 79.5–101.0)
MONO ABS: 0.5 10*3/uL (ref 0.1–0.9)
MONOS PCT: 17 %
Neutro Abs: 1.3 10*3/uL — ABNORMAL LOW (ref 1.5–6.5)
Neutrophils Relative %: 40 %
PLATELETS: 252 10*3/uL (ref 145–400)
RBC: 4.23 MIL/uL (ref 3.70–5.45)
RDW: 14.6 % — ABNORMAL HIGH (ref 11.2–14.5)
WBC: 3.2 10*3/uL — ABNORMAL LOW (ref 3.9–10.3)

## 2018-03-03 MED ORDER — HEPARIN SOD (PORK) LOCK FLUSH 100 UNIT/ML IV SOLN
250.0000 [IU] | Freq: Once | INTRAVENOUS | Status: AC
  Administered 2018-03-03: 250 [IU]
  Filled 2018-03-03: qty 5

## 2018-03-03 MED ORDER — SODIUM CHLORIDE 0.9% FLUSH
10.0000 mL | Freq: Once | INTRAVENOUS | Status: AC
  Administered 2018-03-03: 10 mL
  Filled 2018-03-03: qty 10

## 2018-03-03 NOTE — Assessment & Plan Note (Signed)
This is likely due to recent treatment. The patient denies recent history of fevers, cough, chills, diarrhea or dysuria. She is asymptomatic from the leukopenia. I will observe for now.  I will continue the chemotherapy at current dose without dosage adjustment.  If the leukopenia gets progressive worse in the future, I might have to delay her treatment or adjust the chemotherapy dose. We discussed neutropenic precaution 

## 2018-03-03 NOTE — Assessment & Plan Note (Signed)
She has remarkable response to treatment PET CT scan from 02/16/2018 show near complete response to treatment I recommend we continue on Rituximab monthly and Venetoclax 200 mg daily.   She has recently completed prednisone taper by the end of February She will continue close blood counts monitoring  I plan to repeat PET/CT scan in August again after completion of Rituxan (planned 6 cycles)

## 2018-03-03 NOTE — Progress Notes (Signed)
[place] OFFICE PROGRESS NOTE  Patient Care Team: Janith Lima, MD as PCP - General (Internal Medicine) Constance Haw, MD as Consulting Physician (Cardiology)  ASSESSMENT & PLAN:  CLL (chronic lymphocytic leukemia) ([place]) She has remarkable response to treatment PET CT scan from 02/16/2018 show near complete response to treatment I recommend we continue on Rituximab monthly and Venetoclax 200 mg daily.   She has recently completed prednisone taper by the end of February She will continue close blood counts monitoring  I plan to repeat PET/CT scan in August again after completion of Rituxan (planned 6 cycles)  Drug-induced neutropenia ([place]) This is likely due to recent treatment. The patient denies recent history of fevers, cough, chills, diarrhea or dysuria. She is asymptomatic from the leukopenia. I will observe for now.  I will continue the chemotherapy at current dose without dosage adjustment.  If the leukopenia gets progressive worse in the future, I might have to delay her treatment or adjust the chemotherapy dose. We discussed neutropenic precaution  Nonischemic cardiomyopathy [facility]) Examination is benign She is asymptomatic She will continue medical management She has no signs or symptoms of congestive heart failure ECHO is planned in August   No orders of the defined types were placed in this encounter.   INTERVAL HISTORY: Please see below for problem oriented charting. She returns with her husband for further follow-up She is doing well No new lymphadenopathy No recent cough, chest pain or shortness of breath Denies recent infection She has no side effects from recent infusion  SUMMARY OF ONCOLOGIC HISTORY:   CLL (chronic lymphocytic leukemia) ([place])   04/05/2015 Pathology Results    Accession: [id number] flow cytometry confirmed CLL. FISH was positive for p53 mutation      04/24/2015 Imaging    Extensive lymphadenopathy throughout the  neck, chest (axilla), abdomen and pelvis, as detailed above, compatible with the reported clinical history of lymphoma. 2. Mild splenomegaly.      05/03/2015 - 08/27/2015 Chemotherapy    She started on Ibrutinib, discontinued prematurely when her prescription ran out      10/10/2015 - 05/29/2017 Chemotherapy    She was restarted back on Ibrutinib      11/13/2016 PET scan    Significant generalized reduction in size of numerous lymph nodes in the neck, chest, abdomen, and pelvis. Previously the activity of these nodes was low-level and in general a similar low-level activity is present today, significantly less than mediastinal blood pool activity, compatible with Deauville 2. 2. Coronary atherosclerosis. Mild cardiomegaly. 3. Mildly prominent endometrium for age without accentuated metabolic activity in the endometrium. Consider pelvic sonography for further characterization.      05/27/2017 PET scan    1. Progressive hypermetabolic adenopathy, primarily involving cervical, axillary, pelvic and inguinal lymph nodes bilaterally, consistent with progressive lymphoma. 2. No solid visceral organ or osseous involvement.      06/16/2017 - 08/25/2017 Chemotherapy    The patient had 3 cycles of Rituximab and Bendamustine      07/15/2017 - 07/19/2017 Hospital Admission    The patient was briefly admitted to the hospital due to infusion reaction to rituximab      09/18/2017 PET scan    1. Continued considerable adenopathy in the neck, chest, abdomen, and pelvis. This is generally stable in size but mildly reduced in activity compared to the prior exam. Current levels of activity primarily Deauville 3 and Deauville 4. No splenomegaly. 2. Diffuse new ground-glass opacities in the lungs with associated hypermetabolic  activity. Some forms of lymphoma infiltration can rarely cause this pattern of diffuse ground-glass opacity and hypermetabolic activity. Differential diagnostic considerations might include  atypical pneumonia such as mycoplasma, acute hypersensitivity pneumonitis, or acute eosinophilic pneumonia. Pulmonary hemorrhage seems less likely to cause this degree of accentuated metabolic activity.  3. Aortic Atherosclerosis (ICD10-I70.0). Coronary atherosclerosis.      09/24/2017 -  Chemotherapy    The patient had ventoclax for chemotherapy treatment.  Rituximab is added on 11/18/17      11/19/2017 PET scan    Overall mild interval decrease in hypermetabolic lymphadenopathy throughout the neck, chest, abdomen, and pelvis. No new or increased lymphadenopathy identified.  While there has been resolution of diffuse hypermetabolic bilateral ground-glass pulmonary opacity since prior study, there is a new 14 mm hypermetabolic pulmonary nodule in the posterior right lower lobe. Time course favors inflammatory or infectious etiology over neoplasm. Recommend continued follow-up by chest CT in 3 months.      02/16/2018 PET scan    1. Adenopathy in the neck, chest, and pelvis is stable to minimally reduced in size, and is moderately reduced in activity, primarily Deauville 2 disease today. There is a right common iliac lymph node qualifying as Deauville 3 disease which is stable in size but reduced in activity. 2. Stable size but reduced activity in a pulmonary nodule in the right lower lobe. If this represents a leukemic lesion then a corresponds to Deauville 4 disease. This lesion was not readily apparent on 09/22/2017 but was reported on the prior chest CT of 11/19/2017. 3. Other imaging findings of potential clinical significance: Mild thyroid goiter. Aortic Atherosclerosis (ICD10-I70.0). Coronary atherosclerosis. Mild cardiomegaly.       REVIEW OF SYSTEMS:   Constitutional: Denies fevers, chills or abnormal weight loss Eyes: Denies blurriness of vision Ears, nose, mouth, throat, and face: Denies mucositis or sore throat Respiratory: Denies cough, dyspnea or wheezes Cardiovascular: Denies  palpitation, chest discomfort or lower extremity swelling Gastrointestinal:  Denies nausea, heartburn or change in bowel habits Skin: Denies abnormal skin rashes Lymphatics: Denies new lymphadenopathy or easy bruising Neurological:Denies numbness, tingling or new weaknesses Behavioral/Psych: Mood is stable, no new changes  All other systems were reviewed with the patient and are negative.  I have reviewed the past medical history, past surgical history, social history and family history with the patient and they are unchanged from previous note.  ALLERGIES:  has No Known Allergies.  MEDICATIONS:  Current Outpatient Medications  Medication Sig Dispense Refill  . allopurinol (ZYLOPRIM) 300 MG tablet Take 1 tablet (300 mg total) by mouth daily. 60 tablet 1  . Cholecalciferol (VITAMIN D-3) 1000 units CAPS Take 1,000 Units by mouth daily with breakfast.    . ELIQUIS 5 MG TABS tablet TAKE 1 TABLET BY MOUTH TWICE A DAY (Patient taking differently: Take 5 mg by mouth two times a day) 180 tablet 1  . furosemide (LASIX) 40 MG tablet TAKE 1 TABLET BY MOUTH EVERY DAY (Patient taking differently: Take 40 mg by mouth once a day) 90 tablet 3  . metoprolol succinate (TOPROL-XL) 100 MG 24 hr tablet Take 1 1/2 tablets (150 mg total) daily. Take with or immediately following a meal. 135 tablet 3  . potassium chloride SA (K-DUR,KLOR-CON) 20 MEQ tablet Take 1 tablet (20 mEq total) by mouth 2 (two) times daily. 14 tablet 0  . sacubitril-valsartan (ENTRESTO) 24-26 MG Take 1 tablet by mouth 2 (two) times daily. 180 tablet 3  . venetoclax (VENCLEXTA) 100 MG TABS Take 200  mg by mouth daily. 120 tablet 9   No current facility-administered medications for this visit.     PHYSICAL EXAMINATION: ECOG PERFORMANCE STATUS: 0 - Asymptomatic  Vitals:   03/03/18 0945  BP: 124/82  Pulse: 60  Resp: 18  Temp: 98 F (36.7 C)  SpO2: 100%   Filed Weights   03/03/18 0945  Weight: 199 lb 4.8 oz (90.4 kg)     GENERAL:alert, no distress and comfortable SKIN: skin color, texture, turgor are normal, no rashes or significant lesions EYES: normal, Conjunctiva are pink and non-injected, sclera clear OROPHARYNX:no exudate, no erythema and lips, buccal mucosa, and tongue normal  NECK: supple, thyroid normal size, non-tender, without nodularity LYMPH:  no palpable lymphadenopathy in the cervical, axillary or inguinal LUNGS: clear to auscultation and percussion with normal breathing effort HEART: regular rate & rhythm and no murmurs and no lower extremity edema ABDOMEN:abdomen soft, non-tender and normal bowel sounds Musculoskeletal:no cyanosis of digits and no clubbing  NEURO: alert & oriented x 3 with fluent speech, no focal motor/sensory deficits  LABORATORY DATA:  I have reviewed the data as listed    Component Value Date/Time   NA 143 02/24/2018 0850   NA 140 10/09/2017 1416   K 3.3 (L) 02/24/2018 0850   K 4.4 10/09/2017 1416   CL 109 02/24/2018 0850   CL 105 01/18/2013 0912   CO2 25 02/24/2018 0850   CO2 25 10/09/2017 1416   GLUCOSE 120 02/24/2018 0850   GLUCOSE 117 10/09/2017 1416   GLUCOSE 83 01/18/2013 0912   BUN 9 02/24/2018 0850   BUN 11.1 10/09/2017 1416   CREATININE 0.76 02/24/2018 0850   CREATININE 0.8 10/09/2017 1416   CALCIUM 9.6 02/24/2018 0850   CALCIUM 9.6 10/09/2017 1416   PROT 6.3 (L) 02/24/2018 0850   PROT 6.4 10/09/2017 1416   ALBUMIN 4.0 02/24/2018 0850   ALBUMIN 3.4 (L) 10/09/2017 1416   AST 22 02/24/2018 0850   AST 18 10/09/2017 1416   ALT 25 02/24/2018 0850   ALT 19 10/09/2017 1416   ALKPHOS 81 02/24/2018 0850   ALKPHOS 69 10/09/2017 1416   BILITOT 0.7 02/24/2018 0850   BILITOT 0.57 10/09/2017 1416   GFRNONAA >60 02/24/2018 0850   GFRAA >60 02/24/2018 0850    No results found for: SPEP, UPEP  Lab Results  Component Value Date   WBC 3.2 (L) 03/03/2018   NEUTROABS 1.3 (L) 03/03/2018   HGB 12.1 03/03/2018   HCT 36.6 03/03/2018   MCV 86.5  03/03/2018   PLT 252 03/03/2018      Chemistry      Component Value Date/Time   NA 143 02/24/2018 0850   NA 140 10/09/2017 1416   K 3.3 (L) 02/24/2018 0850   K 4.4 10/09/2017 1416   CL 109 02/24/2018 0850   CL 105 01/18/2013 0912   CO2 25 02/24/2018 0850   CO2 25 10/09/2017 1416   BUN 9 02/24/2018 0850   BUN 11.1 10/09/2017 1416   CREATININE 0.76 02/24/2018 0850   CREATININE 0.8 10/09/2017 1416      Component Value Date/Time   CALCIUM 9.6 02/24/2018 0850   CALCIUM 9.6 10/09/2017 1416   ALKPHOS 81 02/24/2018 0850   ALKPHOS 69 10/09/2017 1416   AST 22 02/24/2018 0850   AST 18 10/09/2017 1416   ALT 25 02/24/2018 0850   ALT 19 10/09/2017 1416   BILITOT 0.7 02/24/2018 0850   BILITOT 0.57 10/09/2017 1416       RADIOGRAPHIC STUDIES: I have  personally reviewed the radiological images as listed and agreed with the findings in the report. Nm Pet Image Restag (ps) Skull Base To Thigh  Result Date: 02/16/2018 CLINICAL DATA:  Subsequent treatment strategy for chronic lymphocytic leukemia. EXAM: NUCLEAR MEDICINE PET SKULL BASE TO THIGH TECHNIQUE: 9.9 mCi F-18 FDG was injected intravenously. Full-ring PET imaging was performed from the skull base to thigh after the radiotracer. CT data was obtained and used for attenuation correction and anatomic localization. Fasting blood glucose: 105 mg/dl COMPARISON:  11/19/2017 FINDINGS: Mediastinal blood pool activity: SUV max 2.4 Background hepatic uptake: 3.7 NECK: Previous physiologic glottic activity is much less intense on today's exam. Scattered small level IIa, level IIb, and level III lymph nodes in the neck are present; an index level IIb lymph node measuring 1.1 cm in short axis on image 25/4 (formerly the same) has a maximum SUV of 2.0 (formerly 2.9). This corresponds to Deauville 2 disease. Incidental CT findings: Diffuse mild prominence of the thyroid gland without hypermetabolic activity. CHEST: Bilateral axillary, subpectoral,  supraclavicular, and right paratracheal lymph nodes are again identified. Index left axillary lymph node measuring 1.4 cm in short axis on image 58/4 (previously 1.6 cm) has a maximum standard uptake value of 1.9 (formerly 3.2). This corresponds to Deauville 2 disease. Posteriorly in the right lower lobe there is a 1.6 by 1.1 cm pulmonary nodule on image 76/4 (formerly 1.5 by 1.1 cm by my measurements) with maximum SUV 4.2 (formerly 6.2). If this represents a leukemic lesion then this represents Deauville 4 disease. Incidental CT findings: Right central line tip: Cavoatrial junction. Three-vessel coronary atherosclerotic calcification with mild cardiomegaly. ABDOMEN/PELVIS: Right common iliac node 1.2 cm in short axis on image 144/4 (stable), maximum SUV 2.5 (formerly 3.4). This represents Deauville 3 disease. Bilateral operator and external iliac adenopathy is observed, index right operator lymph node one point 2 cm in short axis on image 160/4 (formerly the same by my measurement) with maximum SUV 2.2 (formerly 3.1). This represents Deauville 2 disease. Incidental CT findings: No splenomegaly. Aortoiliac atherosclerotic vascular disease. SKELETON: No significant abnormal hypermetabolic activity in this region. Incidental CT findings: Mild sclerosis along the pubic bodies. Lower lumbar degenerative facet arthropathy. IMPRESSION: 1. Adenopathy in the neck, chest, and pelvis is stable to minimally reduced in size, and is moderately reduced in activity, primarily Deauville 2 disease today. There is a right common iliac lymph node qualifying as Deauville 3 disease which is stable in size but reduced in activity. 2. Stable size but reduced activity in a pulmonary nodule in the right lower lobe. If this represents a leukemic lesion then a corresponds to Deauville 4 disease. This lesion was not readily apparent on 09/22/2017 but was reported on the prior chest CT of 11/19/2017. 3. Other imaging findings of potential  clinical significance: Mild thyroid goiter. Aortic Atherosclerosis (ICD10-I70.0). Coronary atherosclerosis. Mild cardiomegaly. Electronically Signed   By: Van Clines M.D.   On: 02/16/2018 10:00    All questions were answered. The patient knows to call the clinic with any problems, questions or concerns. No barriers to learning was detected.  I spent 15 minutes counseling the patient face to face. The total time spent in the appointment was 20 minutes and more than 50% was on counseling and review of test results  Heath Lark, MD 03/03/2018 10:10 AM

## 2018-03-03 NOTE — Telephone Encounter (Signed)
Notified of message below

## 2018-03-03 NOTE — Assessment & Plan Note (Addendum)
Examination is benign She is asymptomatic She will continue medical management She has no signs or symptoms of congestive heart failure ECHO is planned in August 

## 2018-03-03 NOTE — Telephone Encounter (Signed)
-----   Message from Heath Lark, MD sent at 03/03/2018 10:16 AM EDT ----- Regarding: labs pls tell her labs are great WBC is almost normal ----- Message ----- From: Interface, [facility] In [place] Sent: 03/03/2018  10:00 AM To: Heath Lark, MD

## 2018-03-10 ENCOUNTER — Inpatient Hospital Stay: Payer: BLUE CROSS/BLUE SHIELD | Attending: Hematology and Oncology

## 2018-03-10 VITALS — BP 107/65 | HR 61 | Temp 97.6°F | Resp 17

## 2018-03-10 DIAGNOSIS — Z5111 Encounter for antineoplastic chemotherapy: Secondary | ICD-10-CM | POA: Diagnosis not present

## 2018-03-10 DIAGNOSIS — Z79899 Other long term (current) drug therapy: Secondary | ICD-10-CM | POA: Diagnosis not present

## 2018-03-10 DIAGNOSIS — C9110 Chronic lymphocytic leukemia of B-cell type not having achieved remission: Secondary | ICD-10-CM | POA: Diagnosis not present

## 2018-03-10 DIAGNOSIS — I429 Cardiomyopathy, unspecified: Secondary | ICD-10-CM | POA: Insufficient documentation

## 2018-03-10 MED ORDER — DIPHENHYDRAMINE HCL 25 MG PO CAPS
50.0000 mg | ORAL_CAPSULE | Freq: Once | ORAL | Status: AC
  Administered 2018-03-10: 50 mg via ORAL

## 2018-03-10 MED ORDER — ACETAMINOPHEN 325 MG PO TABS
ORAL_TABLET | ORAL | Status: AC
  Filled 2018-03-10: qty 2

## 2018-03-10 MED ORDER — FAMOTIDINE IN NACL 20-0.9 MG/50ML-% IV SOLN
20.0000 mg | Freq: Once | INTRAVENOUS | Status: AC
  Administered 2018-03-10: 20 mg via INTRAVENOUS

## 2018-03-10 MED ORDER — SODIUM CHLORIDE 0.9 % IV SOLN
375.0000 mg/m2 | Freq: Once | INTRAVENOUS | Status: AC
  Administered 2018-03-10: 800 mg via INTRAVENOUS
  Filled 2018-03-10: qty 50

## 2018-03-10 MED ORDER — SODIUM CHLORIDE 0.9 % IV SOLN
Freq: Once | INTRAVENOUS | Status: AC
  Administered 2018-03-10: 09:00:00 via INTRAVENOUS

## 2018-03-10 MED ORDER — FAMOTIDINE IN NACL 20-0.9 MG/50ML-% IV SOLN
INTRAVENOUS | Status: AC
  Filled 2018-03-10: qty 50

## 2018-03-10 MED ORDER — SODIUM CHLORIDE 0.9% FLUSH
10.0000 mL | INTRAVENOUS | Status: DC | PRN
  Administered 2018-03-10: 10 mL
  Filled 2018-03-10: qty 10

## 2018-03-10 MED ORDER — HEPARIN SOD (PORK) LOCK FLUSH 100 UNIT/ML IV SOLN
250.0000 [IU] | Freq: Once | INTRAVENOUS | Status: AC | PRN
  Administered 2018-03-10: 250 [IU]
  Filled 2018-03-10: qty 5

## 2018-03-10 MED ORDER — ACETAMINOPHEN 325 MG PO TABS
650.0000 mg | ORAL_TABLET | Freq: Once | ORAL | Status: AC
  Administered 2018-03-10: 650 mg via ORAL

## 2018-03-10 MED ORDER — DIPHENHYDRAMINE HCL 25 MG PO CAPS
ORAL_CAPSULE | ORAL | Status: AC
  Filled 2018-03-10: qty 2

## 2018-03-10 NOTE — Patient Instructions (Addendum)
[facility] Discharge Instructions for Patients Receiving Chemotherapy  Today you received the following chemotherapy agents:  Rituxan.  To help prevent nausea and vomiting after your treatment, we encourage you to take your nausea medication as directed.   If you develop nausea and vomiting that is not controlled by your nausea medication, call the clinic.   BELOW ARE SYMPTOMS THAT SHOULD BE REPORTED IMMEDIATELY:  *FEVER GREATER THAN 100.5 F  *CHILLS WITH OR WITHOUT FEVER  NAUSEA AND VOMITING THAT IS NOT CONTROLLED WITH YOUR NAUSEA MEDICATION  *UNUSUAL SHORTNESS OF BREATH  *UNUSUAL BRUISING OR BLEEDING  TENDERNESS IN MOUTH AND THROAT WITH OR WITHOUT PRESENCE OF ULCERS  *URINARY PROBLEMS  *BOWEL PROBLEMS  UNUSUAL RASH Items with * indicate a potential emergency and should be followed up as soon as possible.  Feel free to call the clinic should you have any questions or concerns. The clinic phone number is [phone].  Please show the CHEMO ALERT CARD at check-in to the Emergency Department and triage nurse.   

## 2018-03-18 ENCOUNTER — Inpatient Hospital Stay: Payer: BLUE CROSS/BLUE SHIELD

## 2018-03-18 ENCOUNTER — Telehealth: Payer: Self-pay | Admitting: Hematology and Oncology

## 2018-03-18 ENCOUNTER — Inpatient Hospital Stay (HOSPITAL_BASED_OUTPATIENT_CLINIC_OR_DEPARTMENT_OTHER): Payer: BLUE CROSS/BLUE SHIELD | Admitting: Hematology and Oncology

## 2018-03-18 ENCOUNTER — Encounter: Payer: Self-pay | Admitting: Hematology and Oncology

## 2018-03-18 DIAGNOSIS — Z79899 Other long term (current) drug therapy: Secondary | ICD-10-CM

## 2018-03-18 DIAGNOSIS — I429 Cardiomyopathy, unspecified: Secondary | ICD-10-CM | POA: Diagnosis not present

## 2018-03-18 DIAGNOSIS — C9110 Chronic lymphocytic leukemia of B-cell type not having achieved remission: Secondary | ICD-10-CM

## 2018-03-18 DIAGNOSIS — I428 Other cardiomyopathies: Secondary | ICD-10-CM

## 2018-03-18 DIAGNOSIS — Z95828 Presence of other vascular implants and grafts: Secondary | ICD-10-CM

## 2018-03-18 DIAGNOSIS — Z452 Encounter for adjustment and management of vascular access device: Secondary | ICD-10-CM

## 2018-03-18 LAB — COMPREHENSIVE METABOLIC PANEL
ALT: 23 U/L (ref 0–55)
ANION GAP: 9 (ref 3–11)
AST: 23 U/L (ref 5–34)
Albumin: 4 g/dL (ref 3.5–5.0)
Alkaline Phosphatase: 85 U/L (ref 40–150)
BUN: 11 mg/dL (ref 7–26)
CALCIUM: 9.6 mg/dL (ref 8.4–10.4)
CHLORIDE: 107 mmol/L (ref 98–109)
CO2: 26 mmol/L (ref 22–29)
CREATININE: 0.74 mg/dL (ref 0.60–1.10)
GFR calc Af Amer: >60 mL/min (ref >60)
Glucose, Bld: 100 mg/dL (ref 70–140)
Potassium: 3.7 mmol/L (ref 3.5–5.1)
Sodium: 142 mmol/L (ref 136–145)
Total Bilirubin: 0.9 mg/dL (ref 0.2–1.2)
Total Protein: 6.3 g/dL — ABNORMAL LOW (ref 6.4–8.3)

## 2018-03-18 LAB — CBC WITH DIFFERENTIAL/PLATELET
Basophils Absolute: 0 10*3/uL (ref 0.0–0.1)
Basophils Relative: 0 %
EOS ABS: 0 10*3/uL (ref 0.0–0.5)
EOS PCT: 0 %
HCT: 35.1 % (ref 34.8–46.6)
Hemoglobin: 11.3 g/dL — ABNORMAL LOW (ref 11.6–15.9)
LYMPHS ABS: 1.8 10*3/uL (ref 0.9–3.3)
LYMPHS PCT: 47 %
MCH: 28.1 pg (ref 25.1–34.0)
MCHC: 32.2 g/dL (ref 31.5–36.0)
MCV: 87.3 fL (ref 79.5–101.0)
MONO ABS: 0.5 10*3/uL (ref 0.1–0.9)
MONOS PCT: 14 %
Neutro Abs: 1.5 10*3/uL (ref 1.5–6.5)
Neutrophils Relative %: 39 %
PLATELETS: 227 10*3/uL (ref 145–400)
RBC: 4.02 MIL/uL (ref 3.70–5.45)
RDW: 14.3 % (ref 11.2–14.5)
WBC: 3.9 10*3/uL (ref 3.9–10.3)

## 2018-03-18 MED ORDER — HEPARIN SOD (PORK) LOCK FLUSH 100 UNIT/ML IV SOLN
250.0000 [IU] | Freq: Once | INTRAVENOUS | Status: AC
  Administered 2018-03-18: 250 [IU]
  Filled 2018-03-18: qty 5

## 2018-03-18 MED ORDER — SODIUM CHLORIDE 0.9% FLUSH
10.0000 mL | Freq: Once | INTRAVENOUS | Status: AC
  Administered 2018-03-18: 10 mL
  Filled 2018-03-18: qty 10

## 2018-03-18 NOTE — Assessment & Plan Note (Signed)
Examination is benign She is asymptomatic She will continue medical management She has no signs or symptoms of congestive heart failure ECHO is planned in August 

## 2018-03-18 NOTE — Progress Notes (Signed)
[facility] OFFICE PROGRESS NOTE  Patient Care Team: Janith Lima, MD as PCP - General (Internal Medicine) Constance Haw, MD as Consulting Physician (Cardiology)  ASSESSMENT & PLAN:  CLL (chronic lymphocytic leukemia) ([place]) She has remarkable response to treatment PET CT scan from 02/16/2018 show near complete response to treatment I recommend we continue on Rituximab monthly and Venetoclax 200 mg daily.   She has recently completed prednisone taper by the end of February She will continue close blood counts monitoring  I plan to repeat PET/CT scan in August again after completion of Rituxan (planned 6 cycles)  Nonischemic cardiomyopathy ([place]) Examination is benign She is asymptomatic She will continue medical management She has no signs or symptoms of congestive heart failure ECHO is planned in August   No orders of the defined types were placed in this encounter.   INTERVAL HISTORY: Please see below for problem oriented charting. She returns with her husband for further follow-up She is compliant taking her medication as directed No infusion reaction to rituximab No recent exacerbation of congestive heart failure The patient denies any recent signs or symptoms of bleeding such as spontaneous epistaxis, hematuria or hematochezia.  SUMMARY OF ONCOLOGIC HISTORY:   CLL (chronic lymphocytic leukemia) ([place])   04/05/2015 Pathology Results    Accession: [id number] flow cytometry confirmed CLL. FISH was positive for p53 mutation      04/24/2015 Imaging    Extensive lymphadenopathy throughout the neck, chest (axilla), abdomen and pelvis, as detailed above, compatible with the reported clinical history of lymphoma. 2. Mild splenomegaly.      05/03/2015 - 08/27/2015 Chemotherapy    She started on Ibrutinib, discontinued prematurely when her prescription ran out      10/10/2015 - 05/29/2017 Chemotherapy    She was restarted back on Ibrutinib      11/13/2016  PET scan    Significant generalized reduction in size of numerous lymph nodes in the neck, chest, abdomen, and pelvis. Previously the activity of these nodes was low-level and in general a similar low-level activity is present today, significantly less than mediastinal blood pool activity, compatible with Deauville 2. 2. Coronary atherosclerosis. Mild cardiomegaly. 3. Mildly prominent endometrium for age without accentuated metabolic activity in the endometrium. Consider pelvic sonography for further characterization.      05/27/2017 PET scan    1. Progressive hypermetabolic adenopathy, primarily involving cervical, axillary, pelvic and inguinal lymph nodes bilaterally, consistent with progressive lymphoma. 2. No solid visceral organ or osseous involvement.      06/16/2017 - 08/25/2017 Chemotherapy    The patient had 3 cycles of Rituximab and Bendamustine      07/15/2017 - 07/19/2017 Hospital Admission    The patient was briefly admitted to the hospital due to infusion reaction to rituximab      09/18/2017 PET scan    1. Continued considerable adenopathy in the neck, chest, abdomen, and pelvis. This is generally stable in size but mildly reduced in activity compared to the prior exam. Current levels of activity primarily Deauville 3 and Deauville 4. No splenomegaly. 2. Diffuse new ground-glass opacities in the lungs with associated hypermetabolic activity. Some forms of lymphoma infiltration can rarely cause this pattern of diffuse ground-glass opacity and hypermetabolic activity. Differential diagnostic considerations might include atypical pneumonia such as mycoplasma, acute hypersensitivity pneumonitis, or acute eosinophilic pneumonia. Pulmonary hemorrhage seems less likely to cause this degree of accentuated metabolic activity.  3. Aortic Atherosclerosis (ICD10-I70.0). Coronary atherosclerosis.      09/24/2017 -  Chemotherapy    The patient had ventoclax for chemotherapy treatment.   Rituximab is added on 11/18/17      11/19/2017 PET scan    Overall mild interval decrease in hypermetabolic lymphadenopathy throughout the neck, chest, abdomen, and pelvis. No new or increased lymphadenopathy identified.  While there has been resolution of diffuse hypermetabolic bilateral ground-glass pulmonary opacity since prior study, there is a new 14 mm hypermetabolic pulmonary nodule in the posterior right lower lobe. Time course favors inflammatory or infectious etiology over neoplasm. Recommend continued follow-up by chest CT in 3 months.      02/16/2018 PET scan    1. Adenopathy in the neck, chest, and pelvis is stable to minimally reduced in size, and is moderately reduced in activity, primarily Deauville 2 disease today. There is a right common iliac lymph node qualifying as Deauville 3 disease which is stable in size but reduced in activity. 2. Stable size but reduced activity in a pulmonary nodule in the right lower lobe. If this represents a leukemic lesion then a corresponds to Deauville 4 disease. This lesion was not readily apparent on 09/22/2017 but was reported on the prior chest CT of 11/19/2017. 3. Other imaging findings of potential clinical significance: Mild thyroid goiter. Aortic Atherosclerosis (ICD10-I70.0). Coronary atherosclerosis. Mild cardiomegaly.       REVIEW OF SYSTEMS:   Constitutional: Denies fevers, chills or abnormal weight loss Eyes: Denies blurriness of vision Ears, nose, mouth, throat, and face: Denies mucositis or sore throat Respiratory: Denies cough, dyspnea or wheezes Cardiovascular: Denies palpitation, chest discomfort or lower extremity swelling Gastrointestinal:  Denies nausea, heartburn or change in bowel habits Skin: Denies abnormal skin rashes Lymphatics: Denies new lymphadenopathy or easy bruising Neurological:Denies numbness, tingling or new weaknesses Behavioral/Psych: Mood is stable, no new changes  All other systems were reviewed with  the patient and are negative.  I have reviewed the past medical history, past surgical history, social history and family history with the patient and they are unchanged from previous note.  ALLERGIES:  has No Known Allergies.  MEDICATIONS:  Current Outpatient Medications  Medication Sig Dispense Refill  . allopurinol (ZYLOPRIM) 300 MG tablet Take 1 tablet (300 mg total) by mouth daily. 60 tablet 1  . Cholecalciferol (VITAMIN D-3) 1000 units CAPS Take 1,000 Units by mouth daily with breakfast.    . ELIQUIS 5 MG TABS tablet TAKE 1 TABLET BY MOUTH TWICE A DAY 180 tablet 1  . furosemide (LASIX) 40 MG tablet TAKE 1 TABLET BY MOUTH EVERY DAY (Patient taking differently: Take 40 mg by mouth once a day) 90 tablet 3  . metoprolol succinate (TOPROL-XL) 100 MG 24 hr tablet Take 1 1/2 tablets (150 mg total) daily. Take with or immediately following a meal. 135 tablet 3  . potassium chloride SA (K-DUR,KLOR-CON) 20 MEQ tablet Take 1 tablet (20 mEq total) by mouth 2 (two) times daily. 14 tablet 0  . sacubitril-valsartan (ENTRESTO) 24-26 MG Take 1 tablet by mouth 2 (two) times daily. 180 tablet 3  . venetoclax (VENCLEXTA) 100 MG TABS Take 200 mg by mouth daily. 120 tablet 9   No current facility-administered medications for this visit.     PHYSICAL EXAMINATION: ECOG PERFORMANCE STATUS: 0 - Asymptomatic  Vitals:   03/18/18 0959  BP: 131/73  Pulse: (!) 58  Resp: 18  Temp: 97.8 F (36.6 C)  SpO2: 100%   Filed Weights   03/18/18 0959  Weight: 204 lb 1.6 oz (92.6 kg)    GENERAL:alert, no distress  and comfortable SKIN: skin color, texture, turgor are normal, no rashes or significant lesions EYES: normal, Conjunctiva are pink and non-injected, sclera clear OROPHARYNX:no exudate, no erythema and lips, buccal mucosa, and tongue normal  NECK: supple, thyroid normal size, non-tender, without nodularity LYMPH:  no palpable lymphadenopathy in the cervical, axillary or inguinal LUNGS: clear to  auscultation and percussion with normal breathing effort HEART: regular rate & rhythm and no murmurs and no lower extremity edema ABDOMEN:abdomen soft, non-tender and normal bowel sounds Musculoskeletal:no cyanosis of digits and no clubbing  NEURO: alert & oriented x 3 with fluent speech, no focal motor/sensory deficits  LABORATORY DATA:  I have reviewed the data as listed    Component Value Date/Time   NA 142 03/18/2018 0946   NA 140 10/09/2017 1416   K 3.7 03/18/2018 0946   K 4.4 10/09/2017 1416   CL 107 03/18/2018 0946   CL 105 01/18/2013 0912   CO2 26 03/18/2018 0946   CO2 25 10/09/2017 1416   GLUCOSE 100 03/18/2018 0946   GLUCOSE 117 10/09/2017 1416   GLUCOSE 83 01/18/2013 0912   BUN 11 03/18/2018 0946   BUN 11.1 10/09/2017 1416   CREATININE 0.74 03/18/2018 0946   CREATININE 0.8 10/09/2017 1416   CALCIUM 9.6 03/18/2018 0946   CALCIUM 9.6 10/09/2017 1416   PROT 6.3 (L) 03/18/2018 0946   PROT 6.4 10/09/2017 1416   ALBUMIN 4.0 03/18/2018 0946   ALBUMIN 3.4 (L) 10/09/2017 1416   AST 23 03/18/2018 0946   AST 18 10/09/2017 1416   ALT 23 03/18/2018 0946   ALT 19 10/09/2017 1416   ALKPHOS 85 03/18/2018 0946   ALKPHOS 69 10/09/2017 1416   BILITOT 0.9 03/18/2018 0946   BILITOT 0.57 10/09/2017 1416   GFRNONAA >60 03/18/2018 0946   GFRAA >60 03/18/2018 0946    No results found for: SPEP, UPEP  Lab Results  Component Value Date   WBC 3.9 03/18/2018   NEUTROABS 1.5 03/18/2018   HGB 11.3 (L) 03/18/2018   HCT 35.1 03/18/2018   MCV 87.3 03/18/2018   PLT 227 03/18/2018      Chemistry      Component Value Date/Time   NA 142 03/18/2018 0946   NA 140 10/09/2017 1416   K 3.7 03/18/2018 0946   K 4.4 10/09/2017 1416   CL 107 03/18/2018 0946   CL 105 01/18/2013 0912   CO2 26 03/18/2018 0946   CO2 25 10/09/2017 1416   BUN 11 03/18/2018 0946   BUN 11.1 10/09/2017 1416   CREATININE 0.74 03/18/2018 0946   CREATININE 0.8 10/09/2017 1416      Component Value Date/Time    CALCIUM 9.6 03/18/2018 0946   CALCIUM 9.6 10/09/2017 1416   ALKPHOS 85 03/18/2018 0946   ALKPHOS 69 10/09/2017 1416   AST 23 03/18/2018 0946   AST 18 10/09/2017 1416   ALT 23 03/18/2018 0946   ALT 19 10/09/2017 1416   BILITOT 0.9 03/18/2018 0946   BILITOT 0.57 10/09/2017 1416       All questions were answered. The patient knows to call the clinic with any problems, questions or concerns. No barriers to learning was detected.  I spent 10 minutes counseling the patient face to face. The total time spent in the appointment was 15 minutes and more than 50% was on counseling and review of test results  Heath Lark, MD 03/18/2018 1:25 PM

## 2018-03-18 NOTE — Telephone Encounter (Signed)
Gave patient avs and calendar of upcoming June and July appointments.  °

## 2018-03-18 NOTE — Assessment & Plan Note (Signed)
She has remarkable response to treatment PET CT scan from 02/16/2018 show near complete response to treatment I recommend we continue on Rituximab monthly and Venetoclax 200 mg daily.   She has recently completed prednisone taper by the end of February She will continue close blood counts monitoring  I plan to repeat PET/CT scan in August again after completion of Rituxan (planned 6 cycles)

## 2018-03-18 NOTE — Telephone Encounter (Signed)
Gave

## 2018-03-24 ENCOUNTER — Inpatient Hospital Stay: Payer: BLUE CROSS/BLUE SHIELD

## 2018-03-24 DIAGNOSIS — C9110 Chronic lymphocytic leukemia of B-cell type not having achieved remission: Secondary | ICD-10-CM

## 2018-03-24 DIAGNOSIS — Z452 Encounter for adjustment and management of vascular access device: Secondary | ICD-10-CM

## 2018-03-24 DIAGNOSIS — Z95828 Presence of other vascular implants and grafts: Secondary | ICD-10-CM

## 2018-03-24 LAB — COMPREHENSIVE METABOLIC PANEL
ALBUMIN: 4.1 g/dL (ref 3.5–5.0)
ALK PHOS: 85 U/L (ref 40–150)
ALT: 22 U/L (ref 0–55)
AST: 23 U/L (ref 5–34)
Anion gap: 9 (ref 3–11)
BUN: 9 mg/dL (ref 7–26)
CALCIUM: 9.9 mg/dL (ref 8.4–10.4)
CHLORIDE: 106 mmol/L (ref 98–109)
CO2: 26 mmol/L (ref 22–29)
Creatinine, Ser: 0.78 mg/dL (ref 0.60–1.10)
GFR calc Af Amer: >60 mL/min (ref >60)
GFR calc non Af Amer: >60 mL/min (ref >60)
GLUCOSE: 103 mg/dL (ref 70–140)
Potassium: 3.4 mmol/L — ABNORMAL LOW (ref 3.5–5.1)
SODIUM: 141 mmol/L (ref 136–145)
Total Bilirubin: 0.8 mg/dL (ref 0.2–1.2)
Total Protein: 6.4 g/dL (ref 6.4–8.3)

## 2018-03-24 LAB — CBC WITH DIFFERENTIAL/PLATELET
BASOS PCT: 1 %
Basophils Absolute: 0 10*3/uL (ref 0.0–0.1)
EOS ABS: 0 10*3/uL (ref 0.0–0.5)
Eosinophils Relative: 0 %
HCT: 35.3 % (ref 34.8–46.6)
HEMOGLOBIN: 11.6 g/dL (ref 11.6–15.9)
Lymphocytes Relative: 41 %
Lymphs Abs: 1.5 10*3/uL (ref 0.9–3.3)
MCH: 28.7 pg (ref 25.1–34.0)
MCHC: 32.9 g/dL (ref 31.5–36.0)
MCV: 87.3 fL (ref 79.5–101.0)
Monocytes Absolute: 0.6 10*3/uL (ref 0.1–0.9)
Monocytes Relative: 16 %
NEUTROS PCT: 42 %
Neutro Abs: 1.5 10*3/uL (ref 1.5–6.5)
Platelets: 231 10*3/uL (ref 145–400)
RBC: 4.04 MIL/uL (ref 3.70–5.45)
RDW: 14.6 % — ABNORMAL HIGH (ref 11.2–14.5)
WBC: 3.7 10*3/uL — AB (ref 3.9–10.3)

## 2018-03-24 MED ORDER — HEPARIN SOD (PORK) LOCK FLUSH 100 UNIT/ML IV SOLN
250.0000 [IU] | Freq: Once | INTRAVENOUS | Status: AC
  Administered 2018-03-24: 250 [IU]
  Filled 2018-03-24: qty 5

## 2018-03-24 MED ORDER — SODIUM CHLORIDE 0.9% FLUSH
10.0000 mL | Freq: Once | INTRAVENOUS | Status: AC
  Administered 2018-03-24: 10 mL
  Filled 2018-03-24: qty 10

## 2018-03-31 ENCOUNTER — Inpatient Hospital Stay: Payer: BLUE CROSS/BLUE SHIELD

## 2018-03-31 DIAGNOSIS — Z95828 Presence of other vascular implants and grafts: Secondary | ICD-10-CM

## 2018-03-31 DIAGNOSIS — C9110 Chronic lymphocytic leukemia of B-cell type not having achieved remission: Secondary | ICD-10-CM

## 2018-03-31 DIAGNOSIS — Z452 Encounter for adjustment and management of vascular access device: Secondary | ICD-10-CM

## 2018-03-31 LAB — COMPREHENSIVE METABOLIC PANEL
ALBUMIN: 4.1 g/dL (ref 3.5–5.0)
ALT: 19 U/L (ref 0–44)
AST: 20 U/L (ref 15–41)
Alkaline Phosphatase: 85 U/L (ref 38–126)
Anion gap: 10 (ref 5–15)
BUN: 8 mg/dL (ref 6–20)
CHLORIDE: 106 mmol/L (ref 98–111)
CO2: 26 mmol/L (ref 22–32)
CREATININE: 0.74 mg/dL (ref 0.44–1.00)
Calcium: 9.7 mg/dL (ref 8.9–10.3)
GFR calc Af Amer: >60 mL/min (ref >60)
GLUCOSE: 104 mg/dL — AB (ref 70–99)
Potassium: 3 mmol/L — CL (ref 3.5–5.1)
SODIUM: 142 mmol/L (ref 135–145)
Total Bilirubin: 1 mg/dL (ref 0.3–1.2)
Total Protein: 6.5 g/dL (ref 6.5–8.1)

## 2018-03-31 LAB — CBC WITH DIFFERENTIAL/PLATELET
Basophils Absolute: 0 10*3/uL (ref 0.0–0.1)
Basophils Relative: 1 %
EOS ABS: 0 10*3/uL (ref 0.0–0.5)
EOS PCT: 0 %
HCT: 34.8 % (ref 34.8–46.6)
Hemoglobin: 11.6 g/dL (ref 11.6–15.9)
LYMPHS ABS: 1.7 10*3/uL (ref 0.9–3.3)
Lymphocytes Relative: 41 %
MCH: 29 pg (ref 25.1–34.0)
MCHC: 33.4 g/dL (ref 31.5–36.0)
MCV: 86.9 fL (ref 79.5–101.0)
MONOS PCT: 15 %
Monocytes Absolute: 0.6 10*3/uL (ref 0.1–0.9)
Neutro Abs: 1.7 10*3/uL (ref 1.5–6.5)
Neutrophils Relative %: 43 %
PLATELETS: 210 10*3/uL (ref 145–400)
RBC: 4 MIL/uL (ref 3.70–5.45)
RDW: 14.6 % — ABNORMAL HIGH (ref 11.2–14.5)
WBC: 4 10*3/uL (ref 3.9–10.3)

## 2018-03-31 MED ORDER — HEPARIN SOD (PORK) LOCK FLUSH 100 UNIT/ML IV SOLN
250.0000 [IU] | Freq: Once | INTRAVENOUS | Status: AC
  Administered 2018-03-31: 250 [IU]
  Filled 2018-03-31: qty 5

## 2018-03-31 MED ORDER — SODIUM CHLORIDE 0.9% FLUSH
10.0000 mL | Freq: Once | INTRAVENOUS | Status: AC
  Administered 2018-03-31: 10 mL
  Filled 2018-03-31: qty 10

## 2018-04-09 ENCOUNTER — Inpatient Hospital Stay: Payer: BLUE CROSS/BLUE SHIELD

## 2018-04-09 ENCOUNTER — Inpatient Hospital Stay: Payer: BLUE CROSS/BLUE SHIELD | Attending: Hematology and Oncology

## 2018-04-09 DIAGNOSIS — C9110 Chronic lymphocytic leukemia of B-cell type not having achieved remission: Secondary | ICD-10-CM

## 2018-04-09 DIAGNOSIS — Z95828 Presence of other vascular implants and grafts: Secondary | ICD-10-CM

## 2018-04-09 DIAGNOSIS — Z5111 Encounter for antineoplastic chemotherapy: Secondary | ICD-10-CM | POA: Insufficient documentation

## 2018-04-09 DIAGNOSIS — Z79899 Other long term (current) drug therapy: Secondary | ICD-10-CM | POA: Insufficient documentation

## 2018-04-09 DIAGNOSIS — Z452 Encounter for adjustment and management of vascular access device: Secondary | ICD-10-CM

## 2018-04-09 DIAGNOSIS — I429 Cardiomyopathy, unspecified: Secondary | ICD-10-CM | POA: Insufficient documentation

## 2018-04-09 LAB — CBC WITH DIFFERENTIAL/PLATELET
BASOS PCT: 4 %
Basophils Absolute: 0.1 10*3/uL (ref 0.0–0.1)
Eosinophils Absolute: 0 10*3/uL (ref 0.0–0.5)
Eosinophils Relative: 0 %
HEMATOCRIT: 33.4 % — AB (ref 34.8–46.6)
HEMOGLOBIN: 11.3 g/dL — AB (ref 11.6–15.9)
LYMPHS ABS: 1.4 10*3/uL (ref 0.9–3.3)
Lymphocytes Relative: 47 %
MCH: 29.3 pg (ref 25.1–34.0)
MCHC: 33.7 g/dL (ref 31.5–36.0)
MCV: 86.9 fL (ref 79.5–101.0)
MONO ABS: 0.7 10*3/uL (ref 0.1–0.9)
MONOS PCT: 22 %
NEUTROS ABS: 0.8 10*3/uL — AB (ref 1.5–6.5)
NEUTROS PCT: 27 %
Platelets: 221 10*3/uL (ref 145–400)
RBC: 3.84 MIL/uL (ref 3.70–5.45)
RDW: 14.3 % (ref 11.2–14.5)
WBC: 3 10*3/uL — ABNORMAL LOW (ref 3.9–10.3)

## 2018-04-09 LAB — COMPREHENSIVE METABOLIC PANEL
ALBUMIN: 4.2 g/dL (ref 3.5–5.0)
ALK PHOS: 77 U/L (ref 38–126)
ALT: 27 U/L (ref 0–44)
AST: 29 U/L (ref 15–41)
Anion gap: 9 (ref 5–15)
BILIRUBIN TOTAL: 0.9 mg/dL (ref 0.3–1.2)
BUN: 8 mg/dL (ref 6–20)
CALCIUM: 9.3 mg/dL (ref 8.9–10.3)
CO2: 27 mmol/L (ref 22–32)
Chloride: 109 mmol/L (ref 98–111)
Creatinine, Ser: 0.67 mg/dL (ref 0.44–1.00)
GLUCOSE: 106 mg/dL — AB (ref 70–99)
POTASSIUM: 3.4 mmol/L — AB (ref 3.5–5.1)
Sodium: 145 mmol/L (ref 135–145)
TOTAL PROTEIN: 6.4 g/dL — AB (ref 6.5–8.1)

## 2018-04-09 MED ORDER — FAMOTIDINE IN NACL 20-0.9 MG/50ML-% IV SOLN
20.0000 mg | Freq: Once | INTRAVENOUS | Status: AC
  Administered 2018-04-09: 20 mg via INTRAVENOUS

## 2018-04-09 MED ORDER — SODIUM CHLORIDE 0.9 % IV SOLN
375.0000 mg/m2 | Freq: Once | INTRAVENOUS | Status: AC
  Administered 2018-04-09: 800 mg via INTRAVENOUS
  Filled 2018-04-09: qty 50

## 2018-04-09 MED ORDER — ACETAMINOPHEN 325 MG PO TABS
ORAL_TABLET | ORAL | Status: AC
  Filled 2018-04-09: qty 2

## 2018-04-09 MED ORDER — SODIUM CHLORIDE 0.9 % IV SOLN
Freq: Once | INTRAVENOUS | Status: AC
  Administered 2018-04-09: 10:00:00 via INTRAVENOUS

## 2018-04-09 MED ORDER — DIPHENHYDRAMINE HCL 25 MG PO CAPS
ORAL_CAPSULE | ORAL | Status: AC
  Filled 2018-04-09: qty 2

## 2018-04-09 MED ORDER — ACETAMINOPHEN 325 MG PO TABS
650.0000 mg | ORAL_TABLET | Freq: Once | ORAL | Status: AC
  Administered 2018-04-09: 650 mg via ORAL

## 2018-04-09 MED ORDER — FAMOTIDINE IN NACL 20-0.9 MG/50ML-% IV SOLN
INTRAVENOUS | Status: AC
  Filled 2018-04-09: qty 50

## 2018-04-09 MED ORDER — DIPHENHYDRAMINE HCL 25 MG PO CAPS
50.0000 mg | ORAL_CAPSULE | Freq: Once | ORAL | Status: AC
  Administered 2018-04-09: 50 mg via ORAL

## 2018-04-09 MED ORDER — SODIUM CHLORIDE 0.9% FLUSH
10.0000 mL | Freq: Once | INTRAVENOUS | Status: AC
  Administered 2018-04-09: 10 mL
  Filled 2018-04-09: qty 10

## 2018-04-09 MED ORDER — HEPARIN SOD (PORK) LOCK FLUSH 100 UNIT/ML IV SOLN
250.0000 [IU] | Freq: Once | INTRAVENOUS | Status: AC | PRN
  Administered 2018-04-09: 250 [IU]
  Filled 2018-04-09: qty 5

## 2018-04-09 MED ORDER — SODIUM CHLORIDE 0.9% FLUSH
3.0000 mL | INTRAVENOUS | Status: DC | PRN
  Administered 2018-04-09: 10 mL via INTRAVENOUS
  Filled 2018-04-09: qty 10

## 2018-04-09 NOTE — Patient Instructions (Addendum)
[facility] Discharge Instructions for Patients Receiving Chemotherapy  Today you received the following chemotherapy agents:  Rituxan.  To help prevent nausea and vomiting after your treatment, we encourage you to take your nausea medication as directed.   If you develop nausea and vomiting that is not controlled by your nausea medication, call the clinic.   BELOW ARE SYMPTOMS THAT SHOULD BE REPORTED IMMEDIATELY:  *FEVER GREATER THAN 100.5 F  *CHILLS WITH OR WITHOUT FEVER  NAUSEA AND VOMITING THAT IS NOT CONTROLLED WITH YOUR NAUSEA MEDICATION  *UNUSUAL SHORTNESS OF BREATH  *UNUSUAL BRUISING OR BLEEDING  TENDERNESS IN MOUTH AND THROAT WITH OR WITHOUT PRESENCE OF ULCERS  *URINARY PROBLEMS  *BOWEL PROBLEMS  UNUSUAL RASH Items with * indicate a potential emergency and should be followed up as soon as possible.  Feel free to call the clinic should you have any questions or concerns. The clinic phone number is [phone].  Please show the CHEMO ALERT CARD at check-in to the Emergency Department and triage nurse.   

## 2018-04-09 NOTE — Addendum Note (Signed)
Addended by: Paulla Dolly on: 04/09/2018 10:02 AM   Modules accepted: Orders

## 2018-04-09 NOTE — Progress Notes (Signed)
OK to treat with today's low ANC per Dr Alvy Bimler.

## 2018-04-09 NOTE — Patient Instructions (Signed)
Implanted Port Home Guide An implanted port is a type of central line that is placed under the skin. Central lines are used to provide IV access when treatment or nutrition needs to be given through a person's veins. Implanted ports are used for long-term IV access. An implanted port may be placed because:  You need IV medicine that would be irritating to the small veins in your hands or arms.  You need long-term IV medicines, such as antibiotics.  You need IV nutrition for a long period.  You need frequent blood draws for lab tests.  You need dialysis.  Implanted ports are usually placed in the chest area, but they can also be placed in the upper arm, the abdomen, or the leg. An implanted port has two main parts:  Reservoir. The reservoir is round and will appear as a small, raised area under your skin. The reservoir is the part where a needle is inserted to give medicines or draw blood.  Catheter. The catheter is a thin, flexible tube that extends from the reservoir. The catheter is placed into a large vein. Medicine that is inserted into the reservoir goes into the catheter and then into the vein.  How will I care for my incision site? Do not get the incision site wet. Bathe or shower as directed by your health care provider. How is my port accessed? Special steps must be taken to access the port:  Before the port is accessed, a numbing cream can be placed on the skin. This helps numb the skin over the port site.  Your health care provider uses a sterile technique to access the port. ? Your health care provider must put on a mask and sterile gloves. ? The skin over your port is cleaned carefully with an antiseptic and allowed to dry. ? The port is gently pinched between sterile gloves, and a needle is inserted into the port.  Only "non-coring" port needles should be used to access the port. Once the port is accessed, a blood return should be checked. This helps ensure that the port  is in the vein and is not clogged.  If your port needs to remain accessed for a constant infusion, a clear (transparent) bandage will be placed over the needle site. The bandage and needle will need to be changed every week, or as directed by your health care provider.  Keep the bandage covering the needle clean and dry. Do not get it wet. Follow your health care provider's instructions on how to take a shower or bath while the port is accessed.  If your port does not need to stay accessed, no bandage is needed over the port.  What is flushing? Flushing helps keep the port from getting clogged. Follow your health care provider's instructions on how and when to flush the port. Ports are usually flushed with saline solution or a medicine called heparin. The need for flushing will depend on how the port is used.  If the port is used for intermittent medicines or blood draws, the port will need to be flushed: ? After medicines have been given. ? After blood has been drawn. ? As part of routine maintenance.  If a constant infusion is running, the port may not need to be flushed.  How long will my port stay implanted? The port can stay in for as long as your health care provider thinks it is needed. When it is time for the port to come out, surgery will be   done to remove it. The procedure is similar to the one performed when the port was put in. When should I seek immediate medical care? When you have an implanted port, you should seek immediate medical care if:  You notice a bad smell coming from the incision site.  You have swelling, redness, or drainage at the incision site.  You have more swelling or pain at the port site or the surrounding area.  You have a fever that is not controlled with medicine.  This information is not intended to replace advice given to you by your health care provider. Make sure you discuss any questions you have with your health care provider. Document  Released: 09/22/2005 Document Revised: 02/28/2016 Document Reviewed: 05/30/2013 [employer] Interactive Patient Education  2017 [employer]  

## 2018-04-14 ENCOUNTER — Inpatient Hospital Stay: Payer: BLUE CROSS/BLUE SHIELD

## 2018-04-14 ENCOUNTER — Encounter: Payer: Self-pay | Admitting: Hematology and Oncology

## 2018-04-14 ENCOUNTER — Inpatient Hospital Stay (HOSPITAL_BASED_OUTPATIENT_CLINIC_OR_DEPARTMENT_OTHER): Payer: BLUE CROSS/BLUE SHIELD | Admitting: Hematology and Oncology

## 2018-04-14 ENCOUNTER — Telehealth: Payer: Self-pay | Admitting: Hematology and Oncology

## 2018-04-14 VITALS — BP 126/59 | HR 58 | Temp 98.2°F | Resp 17 | Ht 67.0 in | Wt 203.4 lb

## 2018-04-14 DIAGNOSIS — C9110 Chronic lymphocytic leukemia of B-cell type not having achieved remission: Secondary | ICD-10-CM

## 2018-04-14 DIAGNOSIS — I429 Cardiomyopathy, unspecified: Secondary | ICD-10-CM

## 2018-04-14 DIAGNOSIS — D702 Other drug-induced agranulocytosis: Secondary | ICD-10-CM

## 2018-04-14 DIAGNOSIS — Z452 Encounter for adjustment and management of vascular access device: Secondary | ICD-10-CM

## 2018-04-14 DIAGNOSIS — Z79899 Other long term (current) drug therapy: Secondary | ICD-10-CM

## 2018-04-14 DIAGNOSIS — I428 Other cardiomyopathies: Secondary | ICD-10-CM

## 2018-04-14 DIAGNOSIS — Z95828 Presence of other vascular implants and grafts: Secondary | ICD-10-CM

## 2018-04-14 LAB — COMPREHENSIVE METABOLIC PANEL
ALBUMIN: 4.3 g/dL (ref 3.5–5.0)
ALK PHOS: 90 U/L (ref 38–126)
ALT: 35 U/L (ref 0–44)
AST: 30 U/L (ref 15–41)
Anion gap: 9 (ref 5–15)
BILIRUBIN TOTAL: 1.1 mg/dL (ref 0.3–1.2)
BUN: 10 mg/dL (ref 6–20)
CO2: 27 mmol/L (ref 22–32)
Calcium: 9.8 mg/dL (ref 8.9–10.3)
Chloride: 107 mmol/L (ref 98–111)
Creatinine, Ser: 0.77 mg/dL (ref 0.44–1.00)
GFR calc Af Amer: >60 mL/min (ref >60)
GFR calc non Af Amer: >60 mL/min (ref >60)
GLUCOSE: 99 mg/dL (ref 70–99)
Potassium: 3.7 mmol/L (ref 3.5–5.1)
Sodium: 143 mmol/L (ref 135–145)
TOTAL PROTEIN: 6.7 g/dL (ref 6.5–8.1)

## 2018-04-14 LAB — CBC WITH DIFFERENTIAL/PLATELET
BASOS ABS: 0 10*3/uL (ref 0.0–0.1)
BASOS PCT: 0 %
Eosinophils Absolute: 0 10*3/uL (ref 0.0–0.5)
Eosinophils Relative: 0 %
HEMATOCRIT: 35.6 % (ref 34.8–46.6)
HEMOGLOBIN: 11.7 g/dL (ref 11.6–15.9)
Lymphocytes Relative: 52 %
Lymphs Abs: 1.4 10*3/uL (ref 0.9–3.3)
MCH: 28.6 pg (ref 25.1–34.0)
MCHC: 32.9 g/dL (ref 31.5–36.0)
MCV: 87 fL (ref 79.5–101.0)
MONOS PCT: 23 %
Monocytes Absolute: 0.6 10*3/uL (ref 0.1–0.9)
NEUTROS ABS: 0.7 10*3/uL — AB (ref 1.5–6.5)
NEUTROS PCT: 25 %
Platelets: 243 10*3/uL (ref 145–400)
RBC: 4.09 MIL/uL (ref 3.70–5.45)
RDW: 13.9 % (ref 11.2–14.5)
WBC: 2.8 10*3/uL — AB (ref 3.9–10.3)

## 2018-04-14 MED ORDER — HEPARIN SOD (PORK) LOCK FLUSH 100 UNIT/ML IV SOLN
500.0000 [IU] | Freq: Once | INTRAVENOUS | Status: AC
  Administered 2018-04-14: 500 [IU]
  Filled 2018-04-14: qty 5

## 2018-04-14 MED ORDER — SODIUM CHLORIDE 0.9% FLUSH
10.0000 mL | Freq: Once | INTRAVENOUS | Status: AC
  Administered 2018-04-14: 10 mL
  Filled 2018-04-14: qty 10

## 2018-04-14 NOTE — Progress Notes (Signed)
[facility] OFFICE PROGRESS NOTE  Patient Care Team: Janith Lima, MD as PCP - General (Internal Medicine) Constance Haw, MD as Consulting Physician (Cardiology)  ASSESSMENT & PLAN:  CLL (chronic lymphocytic leukemia) [facility]) She has responded very well clinically to treatment I recommend PET CT scan again for repeat staging and to follow-up on abnormal recent imaging from May to make sure she does not need change of plan of care I plan to repeat imaging study next week and see her back after for further discussion about plan of care In the meantime, she will continue venetoclax  Drug-induced neutropenia ([place]) This is likely due to recent treatment. The patient denies recent history of fevers, cough, chills, diarrhea or dysuria. She is asymptomatic from the leukopenia. I will observe for now.  I will continue the chemotherapy at current dose without dosage adjustment.  If the leukopenia gets progressive worse in the future, I might have to delay her treatment or adjust the chemotherapy dose. We discussed neutropenic precaution  Nonischemic cardiomyopathy [facility]) Examination is benign She is asymptomatic She will continue medical management She has no signs or symptoms of congestive heart failure ECHO is planned in August   Orders Placed This Encounter  Procedures  . NM PET Image Restag (PS) Skull Base To Thigh    Standing Status:   Future    Standing Expiration Date:   04/15/2019    Order Specific Question:   If indicated for the ordered procedure, I authorize the administration of a radiopharmaceutical per Radiology protocol    Answer:   Yes    Order Specific Question:   Preferred imaging location?    Answer:   [facility]    Order Specific Question:   Radiology Contrast Protocol - do NOT remove file path    Answer:   [email or web address]    Order Specific Question:   Is the patient pregnant?    Answer:   No    INTERVAL  HISTORY: Please see below for problem oriented charting. She returns with her husband for further follow-up She feels well No recent infection No new lymphadenopathy Her appetite is stable without recent weight change She denies recent cough, chest pain or shortness of breath The patient denies any recent signs or symptoms of bleeding such as spontaneous epistaxis, hematuria or hematochezia.  SUMMARY OF ONCOLOGIC HISTORY:   CLL (chronic lymphocytic leukemia) ([place])   04/05/2015 Pathology Results    Accession: [id number] flow cytometry confirmed CLL. FISH was positive for p53 mutation      04/24/2015 Imaging    Extensive lymphadenopathy throughout the neck, chest (axilla), abdomen and pelvis, as detailed above, compatible with the reported clinical history of lymphoma. 2. Mild splenomegaly.      05/03/2015 - 08/27/2015 Chemotherapy    She started on Ibrutinib, discontinued prematurely when her prescription ran out      10/10/2015 - 05/29/2017 Chemotherapy    She was restarted back on Ibrutinib      11/13/2016 PET scan    Significant generalized reduction in size of numerous lymph nodes in the neck, chest, abdomen, and pelvis. Previously the activity of these nodes was low-level and in general a similar low-level activity is present today, significantly less than mediastinal blood pool activity, compatible with Deauville 2. 2. Coronary atherosclerosis. Mild cardiomegaly. 3. Mildly prominent endometrium for age without accentuated metabolic activity in the endometrium. Consider pelvic sonography for further characterization.      05/27/2017 PET scan    1.  Progressive hypermetabolic adenopathy, primarily involving cervical, axillary, pelvic and inguinal lymph nodes bilaterally, consistent with progressive lymphoma. 2. No solid visceral organ or osseous involvement.      06/16/2017 - 08/25/2017 Chemotherapy    The patient had 3 cycles of Rituximab and Bendamustine      07/15/2017 -  07/19/2017 Hospital Admission    The patient was briefly admitted to the hospital due to infusion reaction to rituximab      09/18/2017 PET scan    1. Continued considerable adenopathy in the neck, chest, abdomen, and pelvis. This is generally stable in size but mildly reduced in activity compared to the prior exam. Current levels of activity primarily Deauville 3 and Deauville 4. No splenomegaly. 2. Diffuse new ground-glass opacities in the lungs with associated hypermetabolic activity. Some forms of lymphoma infiltration can rarely cause this pattern of diffuse ground-glass opacity and hypermetabolic activity. Differential diagnostic considerations might include atypical pneumonia such as mycoplasma, acute hypersensitivity pneumonitis, or acute eosinophilic pneumonia. Pulmonary hemorrhage seems less likely to cause this degree of accentuated metabolic activity.  3. Aortic Atherosclerosis (ICD10-I70.0). Coronary atherosclerosis.      09/24/2017 -  Chemotherapy    The patient had ventoclax for chemotherapy treatment.  Rituximab is added on 11/18/17 to 04/09/18, x 6 cycles      11/19/2017 PET scan    Overall mild interval decrease in hypermetabolic lymphadenopathy throughout the neck, chest, abdomen, and pelvis. No new or increased lymphadenopathy identified.  While there has been resolution of diffuse hypermetabolic bilateral ground-glass pulmonary opacity since prior study, there is a new 14 mm hypermetabolic pulmonary nodule in the posterior right lower lobe. Time course favors inflammatory or infectious etiology over neoplasm. Recommend continued follow-up by chest CT in 3 months.      02/16/2018 PET scan    1. Adenopathy in the neck, chest, and pelvis is stable to minimally reduced in size, and is moderately reduced in activity, primarily Deauville 2 disease today. There is a right common iliac lymph node qualifying as Deauville 3 disease which is stable in size but reduced in activity. 2.  Stable size but reduced activity in a pulmonary nodule in the right lower lobe. If this represents a leukemic lesion then a corresponds to Deauville 4 disease. This lesion was not readily apparent on 09/22/2017 but was reported on the prior chest CT of 11/19/2017. 3. Other imaging findings of potential clinical significance: Mild thyroid goiter. Aortic Atherosclerosis (ICD10-I70.0). Coronary atherosclerosis. Mild cardiomegaly.       REVIEW OF SYSTEMS:   Constitutional: Denies fevers, chills or abnormal weight loss Eyes: Denies blurriness of vision Ears, nose, mouth, throat, and face: Denies mucositis or sore throat Respiratory: Denies cough, dyspnea or wheezes Cardiovascular: Denies palpitation, chest discomfort or lower extremity swelling Gastrointestinal:  Denies nausea, heartburn or change in bowel habits Skin: Denies abnormal skin rashes Lymphatics: Denies new lymphadenopathy or easy bruising Neurological:Denies numbness, tingling or new weaknesses Behavioral/Psych: Mood is stable, no new changes  All other systems were reviewed with the patient and are negative.  I have reviewed the past medical history, past surgical history, social history and family history with the patient and they are unchanged from previous note.  ALLERGIES:  has No Known Allergies.  MEDICATIONS:  Current Outpatient Medications  Medication Sig Dispense Refill  . allopurinol (ZYLOPRIM) 300 MG tablet Take 1 tablet (300 mg total) by mouth daily. 60 tablet 1  . Cholecalciferol (VITAMIN D-3) 1000 units CAPS Take 1,000 Units by mouth daily with breakfast.    .  ELIQUIS 5 MG TABS tablet TAKE 1 TABLET BY MOUTH TWICE A DAY 180 tablet 1  . furosemide (LASIX) 40 MG tablet TAKE 1 TABLET BY MOUTH EVERY DAY (Patient taking differently: Take 40 mg by mouth once a day) 90 tablet 3  . metoprolol succinate (TOPROL-XL) 100 MG 24 hr tablet Take 1 1/2 tablets (150 mg total) daily. Take with or immediately following a meal. 135  tablet 3  . potassium chloride SA (K-DUR,KLOR-CON) 20 MEQ tablet Take 1 tablet (20 mEq total) by mouth 2 (two) times daily. 14 tablet 0  . sacubitril-valsartan (ENTRESTO) 24-26 MG Take 1 tablet by mouth 2 (two) times daily. 180 tablet 3  . venetoclax (VENCLEXTA) 100 MG TABS Take 200 mg by mouth daily. 120 tablet 9   No current facility-administered medications for this visit.    Facility-Administered Medications Ordered in Other Visits  Medication Dose Route Frequency Provider Last Rate Last Dose  . sodium chloride flush (NS) 0.9 % injection 3 mL  3 mL Intravenous PRN Alvy Bimler, Roselyne Stalnaker, MD   10 mL at 04/09/18 1300    PHYSICAL EXAMINATION: ECOG PERFORMANCE STATUS: 1 - Symptomatic but completely ambulatory  Vitals:   04/14/18 0932  BP: (!) 126/59  Pulse: (!) 58  Resp: 17  Temp: 98.2 F (36.8 C)  SpO2: 100%   Filed Weights   04/14/18 0932  Weight: 203 lb 6.4 oz (92.3 kg)    GENERAL:alert, no distress and comfortable SKIN: skin color, texture, turgor are normal, no rashes or significant lesions EYES: normal, Conjunctiva are pink and non-injected, sclera clear OROPHARYNX:no exudate, no erythema and lips, buccal mucosa, and tongue normal  NECK: supple, thyroid normal size, non-tender, without nodularity LYMPH:  no palpable lymphadenopathy in the cervical, axillary or inguinal LUNGS: clear to auscultation and percussion with normal breathing effort HEART: regular rate & rhythm and no murmurs and no lower extremity edema ABDOMEN:abdomen soft, non-tender and normal bowel sounds Musculoskeletal:no cyanosis of digits and no clubbing  NEURO: alert & oriented x 3 with fluent speech, no focal motor/sensory deficits  LABORATORY DATA:  I have reviewed the data as listed    Component Value Date/Time   NA 143 04/14/2018 0852   NA 140 10/09/2017 1416   K 3.7 04/14/2018 0852   K 4.4 10/09/2017 1416   CL 107 04/14/2018 0852   CL 105 01/18/2013 0912   CO2 27 04/14/2018 0852   CO2 25  10/09/2017 1416   GLUCOSE 99 04/14/2018 0852   GLUCOSE 117 10/09/2017 1416   GLUCOSE 83 01/18/2013 0912   BUN 10 04/14/2018 0852   BUN 11.1 10/09/2017 1416   CREATININE 0.77 04/14/2018 0852   CREATININE 0.8 10/09/2017 1416   CALCIUM 9.8 04/14/2018 0852   CALCIUM 9.6 10/09/2017 1416   PROT 6.7 04/14/2018 0852   PROT 6.4 10/09/2017 1416   ALBUMIN 4.3 04/14/2018 0852   ALBUMIN 3.4 (L) 10/09/2017 1416   AST 30 04/14/2018 0852   AST 18 10/09/2017 1416   ALT 35 04/14/2018 0852   ALT 19 10/09/2017 1416   ALKPHOS 90 04/14/2018 0852   ALKPHOS 69 10/09/2017 1416   BILITOT 1.1 04/14/2018 0852   BILITOT 0.57 10/09/2017 1416   GFRNONAA >60 04/14/2018 0852   GFRAA >60 04/14/2018 0852    No results found for: SPEP, UPEP  Lab Results  Component Value Date   WBC 2.8 (L) 04/14/2018   NEUTROABS 0.7 (L) 04/14/2018   HGB 11.7 04/14/2018   HCT 35.6 04/14/2018   MCV 87.0 04/14/2018  PLT 243 04/14/2018      Chemistry      Component Value Date/Time   NA 143 04/14/2018 0852   NA 140 10/09/2017 1416   K 3.7 04/14/2018 0852   K 4.4 10/09/2017 1416   CL 107 04/14/2018 0852   CL 105 01/18/2013 0912   CO2 27 04/14/2018 0852   CO2 25 10/09/2017 1416   BUN 10 04/14/2018 0852   BUN 11.1 10/09/2017 1416   CREATININE 0.77 04/14/2018 0852   CREATININE 0.8 10/09/2017 1416      Component Value Date/Time   CALCIUM 9.8 04/14/2018 0852   CALCIUM 9.6 10/09/2017 1416   ALKPHOS 90 04/14/2018 0852   ALKPHOS 69 10/09/2017 1416   AST 30 04/14/2018 0852   AST 18 10/09/2017 1416   ALT 35 04/14/2018 0852   ALT 19 10/09/2017 1416   BILITOT 1.1 04/14/2018 0852   BILITOT 0.57 10/09/2017 1416       All questions were answered. The patient knows to call the clinic with any problems, questions or concerns. No barriers to learning was detected.  I spent 15 minutes counseling the patient face to face. The total time spent in the appointment was 20 minutes and more than 50% was on counseling and review  of test results  Heath Lark, MD 04/14/2018 11:44 AM

## 2018-04-14 NOTE — Assessment & Plan Note (Signed)
She has responded very well clinically to treatment I recommend PET CT scan again for repeat staging and to follow-up on abnormal recent imaging from May to make sure she does not need change of plan of care I plan to repeat imaging study next week and see her back after for further discussion about plan of care In the meantime, she will continue venetoclax

## 2018-04-14 NOTE — Assessment & Plan Note (Signed)
This is likely due to recent treatment. The patient denies recent history of fevers, cough, chills, diarrhea or dysuria. She is asymptomatic from the leukopenia. I will observe for now.  I will continue the chemotherapy at current dose without dosage adjustment.  If the leukopenia gets progressive worse in the future, I might have to delay her treatment or adjust the chemotherapy dose. We discussed neutropenic precaution

## 2018-04-14 NOTE — Telephone Encounter (Signed)
Gave patient avs and calendar of upcoming July appts.  °

## 2018-04-14 NOTE — Assessment & Plan Note (Signed)
Examination is benign She is asymptomatic She will continue medical management She has no signs or symptoms of congestive heart failure ECHO is planned in August

## 2018-04-15 ENCOUNTER — Other Ambulatory Visit: Payer: Self-pay | Admitting: Hematology and Oncology

## 2018-04-15 ENCOUNTER — Telehealth: Payer: Self-pay

## 2018-04-15 NOTE — Telephone Encounter (Signed)
PET scan scheduled for pt.  Pt made aware of appt date/time

## 2018-04-19 ENCOUNTER — Encounter (HOSPITAL_COMMUNITY)
Admission: RE | Admit: 2018-04-19 | Discharge: 2018-04-19 | Disposition: A | Discharge status: Home / Self Care | Payer: BLUE CROSS/BLUE SHIELD | Source: Ambulatory Visit | Attending: Hematology and Oncology | Admitting: Hematology and Oncology

## 2018-04-19 DIAGNOSIS — C9110 Chronic lymphocytic leukemia of B-cell type not having achieved remission: Secondary | ICD-10-CM | POA: Diagnosis not present

## 2018-04-19 DIAGNOSIS — C9190 Lymphoid leukemia, unspecified not having achieved remission: Secondary | ICD-10-CM | POA: Insufficient documentation

## 2018-04-19 LAB — GLUCOSE, CAPILLARY: Glucose-Capillary: 103 mg/dL — ABNORMAL HIGH (ref 70–99)

## 2018-04-19 MED ORDER — FLUDEOXYGLUCOSE F - 18 (FDG) INJECTION
9.3000 | Freq: Once | INTRAVENOUS | Status: AC | PRN
  Administered 2018-04-19: 9.3 via INTRAVENOUS

## 2018-04-20 ENCOUNTER — Other Ambulatory Visit: Payer: Self-pay | Admitting: Cardiology

## 2018-04-20 ENCOUNTER — Telehealth: Payer: Self-pay

## 2018-04-20 NOTE — Telephone Encounter (Signed)
Oral Oncology Patient Advocate Encounter  [place] informed me of patients prescription insurance coverage ending. I did an investigation and spoke with patient and discovered there is no coverage at this time. I was able apply for manufacturer assistance for her Venclexta and Rituxan, she came in and signed the form as well as Dr. Alvy Bimler.   Application has been faxed to [facility].  We will continue to follow up on application for approval.   Audrey Gross Phone [phone] Fax [phone]

## 2018-04-21 ENCOUNTER — Inpatient Hospital Stay: Payer: BLUE CROSS/BLUE SHIELD

## 2018-04-21 ENCOUNTER — Inpatient Hospital Stay (HOSPITAL_BASED_OUTPATIENT_CLINIC_OR_DEPARTMENT_OTHER): Payer: BLUE CROSS/BLUE SHIELD | Admitting: Hematology and Oncology

## 2018-04-21 ENCOUNTER — Encounter: Payer: Self-pay | Admitting: Hematology and Oncology

## 2018-04-21 ENCOUNTER — Telehealth: Payer: Self-pay | Admitting: Hematology and Oncology

## 2018-04-21 DIAGNOSIS — Z95828 Presence of other vascular implants and grafts: Secondary | ICD-10-CM

## 2018-04-21 DIAGNOSIS — C9110 Chronic lymphocytic leukemia of B-cell type not having achieved remission: Secondary | ICD-10-CM

## 2018-04-21 DIAGNOSIS — I428 Other cardiomyopathies: Secondary | ICD-10-CM

## 2018-04-21 DIAGNOSIS — Z452 Encounter for adjustment and management of vascular access device: Secondary | ICD-10-CM

## 2018-04-21 DIAGNOSIS — D702 Other drug-induced agranulocytosis: Secondary | ICD-10-CM

## 2018-04-21 DIAGNOSIS — Z79899 Other long term (current) drug therapy: Secondary | ICD-10-CM

## 2018-04-21 DIAGNOSIS — I429 Cardiomyopathy, unspecified: Secondary | ICD-10-CM

## 2018-04-21 LAB — COMPREHENSIVE METABOLIC PANEL
ALK PHOS: 93 U/L (ref 38–126)
ALT: 21 U/L (ref 0–44)
ANION GAP: 9 (ref 5–15)
AST: 19 U/L (ref 15–41)
Albumin: 4.2 g/dL (ref 3.5–5.0)
BUN: 10 mg/dL (ref 6–20)
CALCIUM: 9.4 mg/dL (ref 8.9–10.3)
CO2: 25 mmol/L (ref 22–32)
Chloride: 108 mmol/L (ref 98–111)
Creatinine, Ser: 0.74 mg/dL (ref 0.44–1.00)
Glucose, Bld: 101 mg/dL — ABNORMAL HIGH (ref 70–99)
Potassium: 3.5 mmol/L (ref 3.5–5.1)
SODIUM: 142 mmol/L (ref 135–145)
Total Bilirubin: 0.9 mg/dL (ref 0.3–1.2)
Total Protein: 6.6 g/dL (ref 6.5–8.1)

## 2018-04-21 LAB — CBC WITH DIFFERENTIAL/PLATELET
Basophils Absolute: 0 10*3/uL (ref 0.0–0.1)
Basophils Relative: 1 %
EOS ABS: 0 10*3/uL (ref 0.0–0.5)
Eosinophils Relative: 0 %
HCT: 34.1 % — ABNORMAL LOW (ref 34.8–46.6)
HEMOGLOBIN: 11.2 g/dL — AB (ref 11.6–15.9)
LYMPHS ABS: 1.7 10*3/uL (ref 0.9–3.3)
Lymphocytes Relative: 64 %
MCH: 28.6 pg (ref 25.1–34.0)
MCHC: 32.9 g/dL (ref 31.5–36.0)
MCV: 86.7 fL (ref 79.5–101.0)
MONOS PCT: 33 %
Monocytes Absolute: 0.9 10*3/uL (ref 0.1–0.9)
NEUTROS PCT: 2 %
Neutro Abs: 0.1 10*3/uL — CL (ref 1.5–6.5)
Platelets: 244 10*3/uL (ref 145–400)
RBC: 3.93 MIL/uL (ref 3.70–5.45)
RDW: 14.2 % (ref 11.2–14.5)
WBC: 2.6 10*3/uL — ABNORMAL LOW (ref 3.9–10.3)

## 2018-04-21 MED ORDER — HEPARIN SOD (PORK) LOCK FLUSH 100 UNIT/ML IV SOLN
250.0000 [IU] | Freq: Once | INTRAVENOUS | Status: AC
  Administered 2018-04-21: 250 [IU]
  Filled 2018-04-21: qty 5

## 2018-04-21 MED ORDER — SODIUM CHLORIDE 0.9% FLUSH
10.0000 mL | Freq: Once | INTRAVENOUS | Status: AC
  Administered 2018-04-21: 10 mL
  Filled 2018-04-21: qty 10

## 2018-04-21 NOTE — Telephone Encounter (Signed)
Per 7/17 los on 8/2 had to decouple appointments patient is aware

## 2018-04-21 NOTE — Assessment & Plan Note (Signed)
We have extensive discussions about port placement The patient is concerned about interruption of anticoagulation therapy She will discuss with her cardiologist first

## 2018-04-21 NOTE — Assessment & Plan Note (Signed)
I have reviewed her recent PET CT scan and discussed the case at the hematology tumor board Overall, she continues to have positive response to treatment I recommend we continue rituximab once a month and venetoclax at 200 mg daily, indefinitely or for minimum 2 years I will continue to repeat imaging study every 3 to 4 months for objective assessment of response to treatment

## 2018-04-21 NOTE — Assessment & Plan Note (Signed)
Examination is benign She is asymptomatic She will continue medical management She has no signs or symptoms of congestive heart failure ECHO is planned in August She has concerns about returning back to work due to the requirement to lift 40 pounds of weight if necessary I recommend she discuss this with her cardiologist

## 2018-04-21 NOTE — Progress Notes (Signed)
[place] OFFICE PROGRESS NOTE  Patient Care Team: Janith Lima, MD as PCP - General (Internal Medicine) Constance Haw, MD as Consulting Physician (Cardiology)  ASSESSMENT & PLAN:  CLL (chronic lymphocytic leukemia) ([place]) I have reviewed her recent PET CT scan and discussed the case at the hematology tumor board Overall, she continues to have positive response to treatment I recommend we continue rituximab once a month and venetoclax at 200 mg daily, indefinitely or for minimum 2 years I will continue to repeat imaging study every 3 to 4 months for objective assessment of response to treatment  PICC (peripherally inserted central catheter) in place We have extensive discussions about port placement The patient is concerned about interruption of anticoagulation therapy She will discuss with her cardiologist first  Drug-induced neutropenia ([place]) This is likely due to recent treatment. The patient denies recent history of fevers, cough, chills, diarrhea or dysuria. She is asymptomatic from the leukopenia. I will observe for now.  I will continue the chemotherapy at current dose without dosage adjustment.  If the leukopenia gets progressive worse in the future, I might have to delay her treatment or adjust the chemotherapy dose. We discussed neutropenic precaution  Nonischemic cardiomyopathy ([place]) Examination is benign She is asymptomatic She will continue medical management She has no signs or symptoms of congestive heart failure ECHO is planned in August She has concerns about returning back to work due to the requirement to lift 40 pounds of weight if necessary I recommend she discuss this with her cardiologist   No orders of the defined types were placed in this encounter.   INTERVAL HISTORY: Please see below for problem oriented charting. She returns with her husband for further follow-up and review of test results She denies side effects from recent  treatment No recent infection, fever or chills No recent cough or leg swelling She is anxious about the possibility of returning back to work  SUMMARY OF ONCOLOGIC HISTORY:   CLL (chronic lymphocytic leukemia) ([place])   04/05/2015 Pathology Results    Accession: [id number] flow cytometry confirmed CLL. FISH was positive for p53 mutation      04/24/2015 Imaging    Extensive lymphadenopathy throughout the neck, chest (axilla), abdomen and pelvis, as detailed above, compatible with the reported clinical history of lymphoma. 2. Mild splenomegaly.      05/03/2015 - 08/27/2015 Chemotherapy    She started on Ibrutinib, discontinued prematurely when her prescription ran out      10/10/2015 - 05/29/2017 Chemotherapy    She was restarted back on Ibrutinib      11/13/2016 PET scan    Significant generalized reduction in size of numerous lymph nodes in the neck, chest, abdomen, and pelvis. Previously the activity of these nodes was low-level and in general a similar low-level activity is present today, significantly less than mediastinal blood pool activity, compatible with Deauville 2. 2. Coronary atherosclerosis. Mild cardiomegaly. 3. Mildly prominent endometrium for age without accentuated metabolic activity in the endometrium. Consider pelvic sonography for further characterization.      05/27/2017 PET scan    1. Progressive hypermetabolic adenopathy, primarily involving cervical, axillary, pelvic and inguinal lymph nodes bilaterally, consistent with progressive lymphoma. 2. No solid visceral organ or osseous involvement.      06/16/2017 - 08/25/2017 Chemotherapy    The patient had 3 cycles of Rituximab and Bendamustine      07/15/2017 - 07/19/2017 Hospital Admission    The patient was briefly admitted to the hospital due  to infusion reaction to rituximab      09/18/2017 PET scan    1. Continued considerable adenopathy in the neck, chest, abdomen, and pelvis. This is generally stable in size  but mildly reduced in activity compared to the prior exam. Current levels of activity primarily Deauville 3 and Deauville 4. No splenomegaly. 2. Diffuse new ground-glass opacities in the lungs with associated hypermetabolic activity. Some forms of lymphoma infiltration can rarely cause this pattern of diffuse ground-glass opacity and hypermetabolic activity. Differential diagnostic considerations might include atypical pneumonia such as mycoplasma, acute hypersensitivity pneumonitis, or acute eosinophilic pneumonia. Pulmonary hemorrhage seems less likely to cause this degree of accentuated metabolic activity.  3. Aortic Atherosclerosis (ICD10-I70.0). Coronary atherosclerosis.      09/24/2017 -  Chemotherapy    The patient had ventoclax for chemotherapy treatment.  Rituximab is added on 11/18/17 to 04/09/18, x 6 cycles      11/19/2017 PET scan    Overall mild interval decrease in hypermetabolic lymphadenopathy throughout the neck, chest, abdomen, and pelvis. No new or increased lymphadenopathy identified.  While there has been resolution of diffuse hypermetabolic bilateral ground-glass pulmonary opacity since prior study, there is a new 14 mm hypermetabolic pulmonary nodule in the posterior right lower lobe. Time course favors inflammatory or infectious etiology over neoplasm. Recommend continued follow-up by chest CT in 3 months.      02/16/2018 PET scan    1. Adenopathy in the neck, chest, and pelvis is stable to minimally reduced in size, and is moderately reduced in activity, primarily Deauville 2 disease today. There is a right common iliac lymph node qualifying as Deauville 3 disease which is stable in size but reduced in activity. 2. Stable size but reduced activity in a pulmonary nodule in the right lower lobe. If this represents a leukemic lesion then a corresponds to Deauville 4 disease. This lesion was not readily apparent on 09/22/2017 but was reported on the prior chest CT of  11/19/2017. 3. Other imaging findings of potential clinical significance: Mild thyroid goiter. Aortic Atherosclerosis (ICD10-I70.0). Coronary atherosclerosis. Mild cardiomegaly.      04/19/2018 PET scan    1. Interval mild mixed metabolic changes, as detailed. Persistent mildly hypermetabolic bilateral axillary, mediastinal and bilateral pelvic adenopathy and mildly hypermetabolic right lower lobe pulmonary nodule compatible with lymphoproliferative disorder. Deauville 4 based on the subcarinal node. 2. Aortic Atherosclerosis (ICD10-I70.0).       REVIEW OF SYSTEMS:   Constitutional: Denies fevers, chills or abnormal weight loss Eyes: Denies blurriness of vision Ears, nose, mouth, throat, and face: Denies mucositis or sore throat Respiratory: Denies cough, dyspnea or wheezes Cardiovascular: Denies palpitation, chest discomfort or lower extremity swelling Gastrointestinal:  Denies nausea, heartburn or change in bowel habits Skin: Denies abnormal skin rashes Lymphatics: Denies new lymphadenopathy or easy bruising Neurological:Denies numbness, tingling or new weaknesses Behavioral/Psych: Mood is stable, no new changes  All other systems were reviewed with the patient and are negative.  I have reviewed the past medical history, past surgical history, social history and family history with the patient and they are unchanged from previous note.  ALLERGIES:  has No Known Allergies.  MEDICATIONS:  Current Outpatient Medications  Medication Sig Dispense Refill  . allopurinol (ZYLOPRIM) 300 MG tablet Take 1 tablet (300 mg total) by mouth daily. 60 tablet 1  . Cholecalciferol (VITAMIN D-3) 1000 units CAPS Take 1,000 Units by mouth daily with breakfast.    . ELIQUIS 5 MG TABS tablet TAKE 1 TABLET BY MOUTH TWICE A  DAY 180 tablet 1  . furosemide (LASIX) 40 MG tablet TAKE 1 TABLET BY MOUTH EVERY DAY (Patient taking differently: Take 40 mg by mouth once a day) 90 tablet 3  . metoprolol succinate  (TOPROL-XL) 100 MG 24 hr tablet Take 1 1/2 tablets (150 mg total) daily. Take with or immediately following a meal. 135 tablet 3  . potassium chloride SA (K-DUR,KLOR-CON) 20 MEQ tablet Take 1 tablet (20 mEq total) by mouth 2 (two) times daily. 14 tablet 0  . sacubitril-valsartan (ENTRESTO) 24-26 MG Take 1 tablet by mouth 2 (two) times daily. 180 tablet 3  . venetoclax (VENCLEXTA) 100 MG TABS Take 200 mg by mouth daily. 120 tablet 9   No current facility-administered medications for this visit.    Facility-Administered Medications Ordered in Other Visits  Medication Dose Route Frequency Provider Last Rate Last Dose  . sodium chloride flush (NS) 0.9 % injection 3 mL  3 mL Intravenous PRN Alvy Bimler, Nakima Fluegge, MD   10 mL at 04/09/18 1300    PHYSICAL EXAMINATION: ECOG PERFORMANCE STATUS: 1 - Symptomatic but completely ambulatory  Vitals:   04/21/18 0909  BP: 134/70  Pulse: (!) 56  Resp: 18  Temp: 98 F (36.7 C)  SpO2: 100%   Filed Weights   04/21/18 0909  Weight: 203 lb 4.8 oz (92.2 kg)    GENERAL:alert, no distress and comfortable Musculoskeletal:no cyanosis of digits and no clubbing  NEURO: alert & oriented x 3 with fluent speech, no focal motor/sensory deficits  LABORATORY DATA:  I have reviewed the data as listed    Component Value Date/Time   NA 143 04/14/2018 0852   NA 140 10/09/2017 1416   K 3.7 04/14/2018 0852   K 4.4 10/09/2017 1416   CL 107 04/14/2018 0852   CL 105 01/18/2013 0912   CO2 27 04/14/2018 0852   CO2 25 10/09/2017 1416   GLUCOSE 99 04/14/2018 0852   GLUCOSE 117 10/09/2017 1416   GLUCOSE 83 01/18/2013 0912   BUN 10 04/14/2018 0852   BUN 11.1 10/09/2017 1416   CREATININE 0.77 04/14/2018 0852   CREATININE 0.8 10/09/2017 1416   CALCIUM 9.8 04/14/2018 0852   CALCIUM 9.6 10/09/2017 1416   PROT 6.7 04/14/2018 0852   PROT 6.4 10/09/2017 1416   ALBUMIN 4.3 04/14/2018 0852   ALBUMIN 3.4 (L) 10/09/2017 1416   AST 30 04/14/2018 0852   AST 18 10/09/2017 1416    ALT 35 04/14/2018 0852   ALT 19 10/09/2017 1416   ALKPHOS 90 04/14/2018 0852   ALKPHOS 69 10/09/2017 1416   BILITOT 1.1 04/14/2018 0852   BILITOT 0.57 10/09/2017 1416   GFRNONAA >60 04/14/2018 0852   GFRAA >60 04/14/2018 0852    No results found for: SPEP, UPEP  Lab Results  Component Value Date   WBC 2.6 (L) 04/21/2018   NEUTROABS 0.1 (LL) 04/21/2018   HGB 11.2 (L) 04/21/2018   HCT 34.1 (L) 04/21/2018   MCV 86.7 04/21/2018   PLT 244 04/21/2018      Chemistry      Component Value Date/Time   NA 143 04/14/2018 0852   NA 140 10/09/2017 1416   K 3.7 04/14/2018 0852   K 4.4 10/09/2017 1416   CL 107 04/14/2018 0852   CL 105 01/18/2013 0912   CO2 27 04/14/2018 0852   CO2 25 10/09/2017 1416   BUN 10 04/14/2018 0852   BUN 11.1 10/09/2017 1416   CREATININE 0.77 04/14/2018 0852   CREATININE 0.8 10/09/2017 1416  Component Value Date/Time   CALCIUM 9.8 04/14/2018 0852   CALCIUM 9.6 10/09/2017 1416   ALKPHOS 90 04/14/2018 0852   ALKPHOS 69 10/09/2017 1416   AST 30 04/14/2018 0852   AST 18 10/09/2017 1416   ALT 35 04/14/2018 0852   ALT 19 10/09/2017 1416   BILITOT 1.1 04/14/2018 0852   BILITOT 0.57 10/09/2017 1416       RADIOGRAPHIC STUDIES: I have reviewed multiple imaging study with the patient and her husband I have personally reviewed the radiological images as listed and agreed with the findings in the report. Nm Pet Image Restag (ps) Skull Base To Thigh  Result Date: 04/19/2018 CLINICAL DATA:  Subsequent treatment strategy for CLL. EXAM: NUCLEAR MEDICINE PET SKULL BASE TO THIGH TECHNIQUE: 9.3 mCi F-18 FDG was injected intravenously. Full-ring PET imaging was performed from the skull base to thigh after the radiotracer. CT data was obtained and used for attenuation correction and anatomic localization. Fasting blood glucose: 103 mg/dl COMPARISON:  02/16/2018 PET-CT. FINDINGS: Mediastinal blood pool activity: SUV max 2.8 NECK: No hypermetabolic lymph nodes in the  neck. A few mildly enlarged non hypermetabolic lymph nodes in the neck bilaterally, not appreciably changed. Representative right level 2 neck 1.0 cm node with max SUV 2.4 (series 4/image 25), previously 1.1 cm with max SUV 2.0, not appreciably changed in size or metabolism. Incidental CT findings: none CHEST: Posterior right lower lobe 1.4 cm solid pulmonary nodule is mildly hypermetabolic with max SUV 3.2 (series 8/image 42), previously 1.5 cm with max SUV 4.2, not appreciably changed in size, mildly decreased in metabolism. No new hypermetabolic pulmonary findings. Enlarged bilateral axillary lymph nodes are stable in size and stable to mildly increased in metabolism. Representative 1.2 cm right axillary node with max SUV 3.3 (series 4/image 44), previously 1.2 cm with max SUV 2.0, mildly increased in metabolism. Representative mildly enlarged 1.4 cm left axillary node with max SUV 1.6 (series 4/image 58), previously 1.4 cm with max SUV 1.9, not appreciably changed. Mildly enlarged 1.0 cm hypermetabolic subcarinal node with max SUV 4.1 (series 4/image 67), previously 0.9 cm with max SUV 2.8, stable in size, mildly increased in metabolism. Incidental CT findings: No acute consolidative airspace disease, lung masses or new significant pulmonary nodules. Left upper lobe 4 mm solid pulmonary nodule (series 8/image 25) is stable and below PET resolution. Right PICC terminates at the cavoatrial junction. Three-vessel coronary atherosclerosis. Mildly atherosclerotic nonaneurysmal thoracic aorta. ABDOMEN/PELVIS: No enlarged or hypermetabolic abdominal lymph nodes. Mildly enlarged and mildly hypermetabolic bilateral common and external iliac lymph nodes, stable in size and minimally increased in metabolism. Representative 1.1 cm right common iliac node with max SUV 2.8 (series 4/image 143), previously 1.2 cm with max SUV 2.5. Representative 1.2 cm right external iliac node with max SUV 3.0 (series 4/image 160), previously  1.2 cm with max SUV 2.2, minimally increased metabolism. No abnormal hypermetabolic activity within the liver, pancreas, adrenal glands, or spleen. Incidental CT findings: Diffuse hepatic steatosis. Atherosclerotic nonaneurysmal abdominal aorta. SKELETON: No focal hypermetabolic activity to suggest skeletal metastasis. Incidental CT findings: none IMPRESSION: 1. Interval mild mixed metabolic changes, as detailed. Persistent mildly hypermetabolic bilateral axillary, mediastinal and bilateral pelvic adenopathy and mildly hypermetabolic right lower lobe pulmonary nodule compatible with lymphoproliferative disorder. Deauville 4 based on the subcarinal node. 2.  Aortic Atherosclerosis (ICD10-I70.0). Electronically Signed   By: Ilona Sorrel M.D.   On: 04/19/2018 09:15    All questions were answered. The patient knows to call the clinic with any  problems, questions or concerns. No barriers to learning was detected.  I spent 20 minutes counseling the patient face to face. The total time spent in the appointment was 30 minutes and more than 50% was on counseling and review of test results  Heath Lark, MD 04/21/2018 9:37 AM

## 2018-04-21 NOTE — Assessment & Plan Note (Signed)
This is likely due to recent treatment. The patient denies recent history of fevers, cough, chills, diarrhea or dysuria. She is asymptomatic from the leukopenia. I will observe for now.  I will continue the chemotherapy at current dose without dosage adjustment.  If the leukopenia gets progressive worse in the future, I might have to delay her treatment or adjust the chemotherapy dose. We discussed neutropenic precaution

## 2018-04-22 ENCOUNTER — Ambulatory Visit: Payer: Self-pay | Admitting: Hematology and Oncology

## 2018-04-22 ENCOUNTER — Encounter: Payer: Self-pay | Admitting: Pharmacy Technician

## 2018-04-22 NOTE — Progress Notes (Unsigned)
The patient is approved for drug assistance by [place] for Rituxan. Enrollment  is from 04/22/18- 04/22/20 and is based on self pay. Drug replacement request will begin on DOS  03/10/18.

## 2018-04-22 NOTE — Telephone Encounter (Addendum)
Oral Oncology Patient Advocate Encounter  I followed up with [employer] this morning on the Rituxan and Venclexta, both medicines have been approved.  Patient has been notified of approval and explained the following: Rituxan will come to the office and Venclexta will be mailed directly to her home. [name] will not send medications each month without speaking to the patient. [name] will mail patient information regarding approval and the process. [facility] phone number is [phone]. If the patient has not heard from them she will need to contact them 7-10 days before she is out of Venclexta.   Venclexta has been approved for as long as the patient is on therapy unless she gets insurance back, or no longer meets the criteria.  Patient verbalized understanding and appreciation.  [facility] [facility] Phone [phone] Fax [phone]

## 2018-04-29 ENCOUNTER — Inpatient Hospital Stay: Payer: Self-pay

## 2018-04-29 ENCOUNTER — Telehealth: Payer: Self-pay

## 2018-04-29 DIAGNOSIS — C9110 Chronic lymphocytic leukemia of B-cell type not having achieved remission: Secondary | ICD-10-CM

## 2018-04-29 DIAGNOSIS — Z452 Encounter for adjustment and management of vascular access device: Secondary | ICD-10-CM

## 2018-04-29 DIAGNOSIS — Z95828 Presence of other vascular implants and grafts: Secondary | ICD-10-CM

## 2018-04-29 LAB — CBC WITH DIFFERENTIAL/PLATELET
BASOS ABS: 0 10*3/uL (ref 0.0–0.1)
BASOS PCT: 1 %
EOS ABS: 0 10*3/uL (ref 0.0–0.5)
EOS PCT: 0 %
HCT: 32.7 % — ABNORMAL LOW (ref 34.8–46.6)
Hemoglobin: 11 g/dL — ABNORMAL LOW (ref 11.6–15.9)
Lymphocytes Relative: 47 %
Lymphs Abs: 1.2 10*3/uL (ref 0.9–3.3)
MCH: 29.2 pg (ref 25.1–34.0)
MCHC: 33.7 g/dL (ref 31.5–36.0)
MCV: 86.5 fL (ref 79.5–101.0)
MONO ABS: 0.7 10*3/uL (ref 0.1–0.9)
Monocytes Relative: 25 %
Neutro Abs: 0.7 10*3/uL — ABNORMAL LOW (ref 1.5–6.5)
Neutrophils Relative %: 27 %
PLATELETS: 221 10*3/uL (ref 145–400)
RBC: 3.78 MIL/uL (ref 3.70–5.45)
RDW: 14 % (ref 11.2–14.5)
WBC: 2.6 10*3/uL — ABNORMAL LOW (ref 3.9–10.3)

## 2018-04-29 LAB — COMPREHENSIVE METABOLIC PANEL
ALBUMIN: 3.9 g/dL (ref 3.5–5.0)
ALT: 14 U/L (ref 0–44)
AST: 18 U/L (ref 15–41)
Alkaline Phosphatase: 91 U/L (ref 38–126)
Anion gap: 10 (ref 5–15)
BUN: 10 mg/dL (ref 6–20)
CHLORIDE: 109 mmol/L (ref 98–111)
CO2: 25 mmol/L (ref 22–32)
CREATININE: 0.77 mg/dL (ref 0.44–1.00)
Calcium: 9.5 mg/dL (ref 8.9–10.3)
GFR calc Af Amer: >60 mL/min (ref >60)
Glucose, Bld: 107 mg/dL — ABNORMAL HIGH (ref 70–99)
POTASSIUM: 3.2 mmol/L — AB (ref 3.5–5.1)
Sodium: 144 mmol/L (ref 135–145)
Total Bilirubin: 0.7 mg/dL (ref 0.3–1.2)
Total Protein: 6.1 g/dL — ABNORMAL LOW (ref 6.5–8.1)

## 2018-04-29 MED ORDER — SODIUM CHLORIDE 0.9% FLUSH
10.0000 mL | Freq: Once | INTRAVENOUS | Status: AC
  Administered 2018-04-29: 10 mL
  Filled 2018-04-29: qty 10

## 2018-04-29 MED ORDER — HEPARIN SOD (PORK) LOCK FLUSH 100 UNIT/ML IV SOLN
250.0000 [IU] | Freq: Once | INTRAVENOUS | Status: AC
  Administered 2018-04-29: 250 [IU]
  Filled 2018-04-29: qty 5

## 2018-04-29 NOTE — Telephone Encounter (Signed)
-----   Message from Heath Lark, MD sent at 04/29/2018 10:49 AM EDT ----- Regarding: labs ok Tell her labs are ok Potassium a bit low. Recommend potassium rich diet

## 2018-04-29 NOTE — Telephone Encounter (Signed)
Called and left a message asking her to call the nurse.

## 2018-05-03 ENCOUNTER — Telehealth: Payer: Self-pay

## 2018-05-03 NOTE — Telephone Encounter (Signed)
Called per Dr. Alvy Bimler. Tell her labs are ok on 7/25. Potassium a bit low. Recommend potassium rich diet. Gave her examples of high potassium foods. She verbalized understanding.

## 2018-05-06 ENCOUNTER — Inpatient Hospital Stay (HOSPITAL_BASED_OUTPATIENT_CLINIC_OR_DEPARTMENT_OTHER): Payer: Self-pay | Admitting: Hematology and Oncology

## 2018-05-06 ENCOUNTER — Encounter: Payer: Self-pay | Admitting: Hematology and Oncology

## 2018-05-06 ENCOUNTER — Inpatient Hospital Stay: Payer: Self-pay | Attending: Hematology and Oncology

## 2018-05-06 ENCOUNTER — Inpatient Hospital Stay: Payer: Self-pay

## 2018-05-06 DIAGNOSIS — Z95828 Presence of other vascular implants and grafts: Secondary | ICD-10-CM

## 2018-05-06 DIAGNOSIS — I428 Other cardiomyopathies: Secondary | ICD-10-CM | POA: Insufficient documentation

## 2018-05-06 DIAGNOSIS — C9110 Chronic lymphocytic leukemia of B-cell type not having achieved remission: Secondary | ICD-10-CM

## 2018-05-06 DIAGNOSIS — Z9221 Personal history of antineoplastic chemotherapy: Secondary | ICD-10-CM | POA: Insufficient documentation

## 2018-05-06 DIAGNOSIS — D61818 Other pancytopenia: Secondary | ICD-10-CM | POA: Insufficient documentation

## 2018-05-06 DIAGNOSIS — Z5112 Encounter for antineoplastic immunotherapy: Secondary | ICD-10-CM | POA: Insufficient documentation

## 2018-05-06 DIAGNOSIS — Z452 Encounter for adjustment and management of vascular access device: Secondary | ICD-10-CM

## 2018-05-06 DIAGNOSIS — Z7901 Long term (current) use of anticoagulants: Secondary | ICD-10-CM | POA: Insufficient documentation

## 2018-05-06 LAB — CBC WITH DIFFERENTIAL/PLATELET
BASOS PCT: 0 %
Basophils Absolute: 0 10*3/uL (ref 0.0–0.1)
Eosinophils Absolute: 0 10*3/uL (ref 0.0–0.5)
Eosinophils Relative: 0 %
HCT: 35.8 % (ref 34.8–46.6)
HEMOGLOBIN: 11.6 g/dL (ref 11.6–15.9)
Lymphocytes Relative: 42 %
Lymphs Abs: 1.8 10*3/uL (ref 0.9–3.3)
MCH: 28.6 pg (ref 25.1–34.0)
MCHC: 32.4 g/dL (ref 31.5–36.0)
MCV: 88.4 fL (ref 79.5–101.0)
Monocytes Absolute: 0.4 10*3/uL (ref 0.1–0.9)
Monocytes Relative: 9 %
NEUTROS PCT: 49 %
Neutro Abs: 2 10*3/uL (ref 1.5–6.5)
Platelets: 247 10*3/uL (ref 145–400)
RBC: 4.05 MIL/uL (ref 3.70–5.45)
RDW: 13.8 % (ref 11.2–14.5)
WBC: 4.2 10*3/uL (ref 3.9–10.3)

## 2018-05-06 MED ORDER — SODIUM CHLORIDE 0.9% FLUSH
10.0000 mL | Freq: Once | INTRAVENOUS | Status: AC
  Administered 2018-05-06: 10 mL
  Filled 2018-05-06: qty 10

## 2018-05-06 MED ORDER — HEPARIN SOD (PORK) LOCK FLUSH 100 UNIT/ML IV SOLN
500.0000 [IU] | Freq: Once | INTRAVENOUS | Status: AC
  Administered 2018-05-06: 250 [IU]
  Filled 2018-05-06: qty 5

## 2018-05-07 ENCOUNTER — Encounter: Payer: Self-pay | Admitting: Hematology and Oncology

## 2018-05-07 ENCOUNTER — Inpatient Hospital Stay: Payer: Self-pay

## 2018-05-07 VITALS — BP 109/77 | HR 54 | Temp 97.8°F | Resp 16

## 2018-05-07 DIAGNOSIS — C9110 Chronic lymphocytic leukemia of B-cell type not having achieved remission: Secondary | ICD-10-CM

## 2018-05-07 MED ORDER — SODIUM CHLORIDE 0.9 % IV SOLN
Freq: Once | INTRAVENOUS | Status: AC
  Administered 2018-05-07: 09:00:00 via INTRAVENOUS
  Filled 2018-05-07: qty 250

## 2018-05-07 MED ORDER — HEPARIN SOD (PORK) LOCK FLUSH 100 UNIT/ML IV SOLN
250.0000 [IU] | Freq: Once | INTRAVENOUS | Status: AC | PRN
  Administered 2018-05-07: 250 [IU]
  Filled 2018-05-07: qty 5

## 2018-05-07 MED ORDER — ACETAMINOPHEN 325 MG PO TABS
650.0000 mg | ORAL_TABLET | Freq: Once | ORAL | Status: AC
  Administered 2018-05-07: 650 mg via ORAL

## 2018-05-07 MED ORDER — DIPHENHYDRAMINE HCL 25 MG PO CAPS
ORAL_CAPSULE | ORAL | Status: AC
  Filled 2018-05-07: qty 2

## 2018-05-07 MED ORDER — FAMOTIDINE IN NACL 20-0.9 MG/50ML-% IV SOLN
INTRAVENOUS | Status: AC
  Filled 2018-05-07: qty 50

## 2018-05-07 MED ORDER — FAMOTIDINE IN NACL 20-0.9 MG/50ML-% IV SOLN
20.0000 mg | Freq: Once | INTRAVENOUS | Status: AC
  Administered 2018-05-07: 20 mg via INTRAVENOUS

## 2018-05-07 MED ORDER — SODIUM CHLORIDE 0.9% FLUSH
10.0000 mL | INTRAVENOUS | Status: DC | PRN
  Administered 2018-05-07: 10 mL
  Filled 2018-05-07: qty 10

## 2018-05-07 MED ORDER — DIPHENHYDRAMINE HCL 25 MG PO CAPS
50.0000 mg | ORAL_CAPSULE | Freq: Once | ORAL | Status: AC
  Administered 2018-05-07: 50 mg via ORAL

## 2018-05-07 MED ORDER — ACETAMINOPHEN 325 MG PO TABS
ORAL_TABLET | ORAL | Status: AC
  Filled 2018-05-07: qty 2

## 2018-05-07 MED ORDER — SODIUM CHLORIDE 0.9 % IV SOLN
375.0000 mg/m2 | Freq: Once | INTRAVENOUS | Status: AC
  Administered 2018-05-07: 800 mg via INTRAVENOUS
  Filled 2018-05-07: qty 50

## 2018-05-07 NOTE — Progress Notes (Signed)
[facility] OFFICE PROGRESS NOTE  Patient Care Team: Janith Lima, MD as PCP - General (Internal Medicine) Constance Haw, MD as Consulting Physician (Cardiology)  ASSESSMENT & PLAN:  CLL (chronic lymphocytic leukemia) ([place]) I have reviewed her recent PET CT scan and discussed the case at the hematology tumor board Overall, she continues to have positive response to treatment I recommend we continue rituximab once a month and venetoclax at 200 mg daily, indefinitely or for minimum 2 years I will continue to repeat imaging study every 3 to 4 months for objective assessment of response to treatment  Nonischemic cardiomyopathy ([place]) Examination is benign She is asymptomatic She will continue medical management She has no signs or symptoms of congestive heart failure ECHO is planned next week She has concerns about returning back to work due to the requirement to lift 40 pounds of weight if necessary I recommend she discuss this with her cardiologist after ECHO result is available   Orders Placed This Encounter  Procedures  . Comprehensive metabolic panel    Standing Status:   Standing    Number of Occurrences:   22    Standing Expiration Date:   05/08/2019  . CBC with Differential/Platelet    Standing Status:   Standing    Number of Occurrences:   22    Standing Expiration Date:   05/08/2019  . Lactate dehydrogenase    Standing Status:   Standing    Number of Occurrences:   22    Standing Expiration Date:   05/08/2019    INTERVAL HISTORY: Please see below for problem oriented charting. She returns with her husband for further follow-up She is doing well Denies recent infection, fever or chills No recent exacerbation of congestive heart failure  SUMMARY OF ONCOLOGIC HISTORY:   CLL (chronic lymphocytic leukemia) ([place])   04/05/2015 Pathology Results    Accession: [id number] flow cytometry confirmed CLL. FISH was positive for p53 mutation      04/24/2015  Imaging    Extensive lymphadenopathy throughout the neck, chest (axilla), abdomen and pelvis, as detailed above, compatible with the reported clinical history of lymphoma. 2. Mild splenomegaly.      05/03/2015 - 08/27/2015 Chemotherapy    She started on Ibrutinib, discontinued prematurely when her prescription ran out      10/10/2015 - 05/29/2017 Chemotherapy    She was restarted back on Ibrutinib      11/13/2016 PET scan    Significant generalized reduction in size of numerous lymph nodes in the neck, chest, abdomen, and pelvis. Previously the activity of these nodes was low-level and in general a similar low-level activity is present today, significantly less than mediastinal blood pool activity, compatible with Deauville 2. 2. Coronary atherosclerosis. Mild cardiomegaly. 3. Mildly prominent endometrium for age without accentuated metabolic activity in the endometrium. Consider pelvic sonography for further characterization.      05/27/2017 PET scan    1. Progressive hypermetabolic adenopathy, primarily involving cervical, axillary, pelvic and inguinal lymph nodes bilaterally, consistent with progressive lymphoma. 2. No solid visceral organ or osseous involvement.      06/16/2017 - 08/25/2017 Chemotherapy    The patient had 3 cycles of Rituximab and Bendamustine      07/15/2017 - 07/19/2017 Hospital Admission    The patient was briefly admitted to the hospital due to infusion reaction to rituximab      09/18/2017 PET scan    1. Continued considerable adenopathy in the neck, chest, abdomen, and pelvis. This  is generally stable in size but mildly reduced in activity compared to the prior exam. Current levels of activity primarily Deauville 3 and Deauville 4. No splenomegaly. 2. Diffuse new ground-glass opacities in the lungs with associated hypermetabolic activity. Some forms of lymphoma infiltration can rarely cause this pattern of diffuse ground-glass opacity and hypermetabolic activity.  Differential diagnostic considerations might include atypical pneumonia such as mycoplasma, acute hypersensitivity pneumonitis, or acute eosinophilic pneumonia. Pulmonary hemorrhage seems less likely to cause this degree of accentuated metabolic activity.  3. Aortic Atherosclerosis (ICD10-I70.0). Coronary atherosclerosis.      09/24/2017 -  Chemotherapy    The patient had ventoclax for chemotherapy treatment.  Rituximab is added on 11/18/17 to 04/09/18, x 6 cycles      11/19/2017 PET scan    Overall mild interval decrease in hypermetabolic lymphadenopathy throughout the neck, chest, abdomen, and pelvis. No new or increased lymphadenopathy identified.  While there has been resolution of diffuse hypermetabolic bilateral ground-glass pulmonary opacity since prior study, there is a new 14 mm hypermetabolic pulmonary nodule in the posterior right lower lobe. Time course favors inflammatory or infectious etiology over neoplasm. Recommend continued follow-up by chest CT in 3 months.      02/16/2018 PET scan    1. Adenopathy in the neck, chest, and pelvis is stable to minimally reduced in size, and is moderately reduced in activity, primarily Deauville 2 disease today. There is a right common iliac lymph node qualifying as Deauville 3 disease which is stable in size but reduced in activity. 2. Stable size but reduced activity in a pulmonary nodule in the right lower lobe. If this represents a leukemic lesion then a corresponds to Deauville 4 disease. This lesion was not readily apparent on 09/22/2017 but was reported on the prior chest CT of 11/19/2017. 3. Other imaging findings of potential clinical significance: Mild thyroid goiter. Aortic Atherosclerosis (ICD10-I70.0). Coronary atherosclerosis. Mild cardiomegaly.      04/19/2018 PET scan    1. Interval mild mixed metabolic changes, as detailed. Persistent mildly hypermetabolic bilateral axillary, mediastinal and bilateral pelvic adenopathy and mildly  hypermetabolic right lower lobe pulmonary nodule compatible with lymphoproliferative disorder. Deauville 4 based on the subcarinal node. 2. Aortic Atherosclerosis (ICD10-I70.0).       REVIEW OF SYSTEMS:   Constitutional: Denies fevers, chills or abnormal weight loss Eyes: Denies blurriness of vision Ears, nose, mouth, throat, and face: Denies mucositis or sore throat Respiratory: Denies cough, dyspnea or wheezes Cardiovascular: Denies palpitation, chest discomfort or lower extremity swelling Gastrointestinal:  Denies nausea, heartburn or change in bowel habits Skin: Denies abnormal skin rashes Lymphatics: Denies new lymphadenopathy or easy bruising Neurological:Denies numbness, tingling or new weaknesses Behavioral/Psych: Mood is stable, no new changes  All other systems were reviewed with the patient and are negative.  I have reviewed the past medical history, past surgical history, social history and family history with the patient and they are unchanged from previous note.  ALLERGIES:  has No Known Allergies.  MEDICATIONS:  Current Outpatient Medications  Medication Sig Dispense Refill  . allopurinol (ZYLOPRIM) 300 MG tablet Take 1 tablet (300 mg total) by mouth daily. 60 tablet 1  . Cholecalciferol (VITAMIN D-3) 1000 units CAPS Take 1,000 Units by mouth daily with breakfast.    . ELIQUIS 5 MG TABS tablet TAKE 1 TABLET BY MOUTH TWICE A DAY 180 tablet 1  . furosemide (LASIX) 40 MG tablet TAKE 1 TABLET BY MOUTH EVERY DAY (Patient taking differently: Take 40 mg by mouth once a  day) 90 tablet 3  . metoprolol succinate (TOPROL-XL) 100 MG 24 hr tablet Take 1 1/2 tablets (150 mg total) daily. Take with or immediately following a meal. 135 tablet 3  . potassium chloride SA (K-DUR,KLOR-CON) 20 MEQ tablet Take 1 tablet (20 mEq total) by mouth 2 (two) times daily. 14 tablet 0  . sacubitril-valsartan (ENTRESTO) 24-26 MG Take 1 tablet by mouth 2 (two) times daily. 180 tablet 3  . venetoclax  (VENCLEXTA) 100 MG TABS Take 200 mg by mouth daily. 120 tablet 9   No current facility-administered medications for this visit.    Facility-Administered Medications Ordered in Other Visits  Medication Dose Route Frequency Provider Last Rate Last Dose  . heparin lock flush 100 unit/mL  250 Units Intracatheter Once PRN Alvy Bimler, Emelyn Roen, MD      . sodium chloride flush (NS) 0.9 % injection 10 mL  10 mL Intracatheter PRN Cyndal Kasson, MD      . sodium chloride flush (NS) 0.9 % injection 3 mL  3 mL Intravenous PRN Alvy Bimler, Chaye Misch, MD   10 mL at 04/09/18 1300    PHYSICAL EXAMINATION: ECOG PERFORMANCE STATUS: 1 - Symptomatic but completely ambulatory  Vitals:   05/06/18 1324  BP: 111/70  Pulse: (!) 56  Resp: 18  Temp: 97.8 F (36.6 C)  SpO2: 100%   Filed Weights   05/06/18 1324  Weight: 207 lb (93.9 kg)    GENERAL:alert, no distress and comfortable SKIN: skin color, texture, turgor are normal, no rashes or significant lesions EYES: normal, Conjunctiva are pink and non-injected, sclera clear OROPHARYNX:no exudate, no erythema and lips, buccal mucosa, and tongue normal  NECK: supple, thyroid normal size, non-tender, without nodularity LYMPH:  no palpable lymphadenopathy in the cervical, axillary or inguinal LUNGS: clear to auscultation and percussion with normal breathing effort HEART: regular rate & rhythm and no murmurs and no lower extremity edema ABDOMEN:abdomen soft, non-tender and normal bowel sounds Musculoskeletal:no cyanosis of digits and no clubbing  NEURO: alert & oriented x 3 with fluent speech, no focal motor/sensory deficits  LABORATORY DATA:  I have reviewed the data as listed    Component Value Date/Time   NA 144 04/29/2018 0848   NA 140 10/09/2017 1416   K 3.2 (L) 04/29/2018 0848   K 4.4 10/09/2017 1416   CL 109 04/29/2018 0848   CL 105 01/18/2013 0912   CO2 25 04/29/2018 0848   CO2 25 10/09/2017 1416   GLUCOSE 107 (H) 04/29/2018 0848   GLUCOSE 117 10/09/2017 1416    GLUCOSE 83 01/18/2013 0912   BUN 10 04/29/2018 0848   BUN 11.1 10/09/2017 1416   CREATININE 0.77 04/29/2018 0848   CREATININE 0.8 10/09/2017 1416   CALCIUM 9.5 04/29/2018 0848   CALCIUM 9.6 10/09/2017 1416   PROT 6.1 (L) 04/29/2018 0848   PROT 6.4 10/09/2017 1416   ALBUMIN 3.9 04/29/2018 0848   ALBUMIN 3.4 (L) 10/09/2017 1416   AST 18 04/29/2018 0848   AST 18 10/09/2017 1416   ALT 14 04/29/2018 0848   ALT 19 10/09/2017 1416   ALKPHOS 91 04/29/2018 0848   ALKPHOS 69 10/09/2017 1416   BILITOT 0.7 04/29/2018 0848   BILITOT 0.57 10/09/2017 1416   GFRNONAA >60 04/29/2018 0848   GFRAA >60 04/29/2018 0848    No results found for: SPEP, UPEP  Lab Results  Component Value Date   WBC 4.2 05/06/2018   NEUTROABS 2.0 05/06/2018   HGB 11.6 05/06/2018   HCT 35.8 05/06/2018   MCV 88.4  05/06/2018   PLT 247 05/06/2018      Chemistry      Component Value Date/Time   NA 144 04/29/2018 0848   NA 140 10/09/2017 1416   K 3.2 (L) 04/29/2018 0848   K 4.4 10/09/2017 1416   CL 109 04/29/2018 0848   CL 105 01/18/2013 0912   CO2 25 04/29/2018 0848   CO2 25 10/09/2017 1416   BUN 10 04/29/2018 0848   BUN 11.1 10/09/2017 1416   CREATININE 0.77 04/29/2018 0848   CREATININE 0.8 10/09/2017 1416      Component Value Date/Time   CALCIUM 9.5 04/29/2018 0848   CALCIUM 9.6 10/09/2017 1416   ALKPHOS 91 04/29/2018 0848   ALKPHOS 69 10/09/2017 1416   AST 18 04/29/2018 0848   AST 18 10/09/2017 1416   ALT 14 04/29/2018 0848   ALT 19 10/09/2017 1416   BILITOT 0.7 04/29/2018 0848   BILITOT 0.57 10/09/2017 1416       RADIOGRAPHIC STUDIES: I have personally reviewed the radiological images as listed and agreed with the findings in the report. Nm Pet Image Restag (ps) Skull Base To Thigh  Result Date: 04/19/2018 CLINICAL DATA:  Subsequent treatment strategy for CLL. EXAM: NUCLEAR MEDICINE PET SKULL BASE TO THIGH TECHNIQUE: 9.3 mCi F-18 FDG was injected intravenously. Full-ring PET imaging was  performed from the skull base to thigh after the radiotracer. CT data was obtained and used for attenuation correction and anatomic localization. Fasting blood glucose: 103 mg/dl COMPARISON:  02/16/2018 PET-CT. FINDINGS: Mediastinal blood pool activity: SUV max 2.8 NECK: No hypermetabolic lymph nodes in the neck. A few mildly enlarged non hypermetabolic lymph nodes in the neck bilaterally, not appreciably changed. Representative right level 2 neck 1.0 cm node with max SUV 2.4 (series 4/image 25), previously 1.1 cm with max SUV 2.0, not appreciably changed in size or metabolism. Incidental CT findings: none CHEST: Posterior right lower lobe 1.4 cm solid pulmonary nodule is mildly hypermetabolic with max SUV 3.2 (series 8/image 42), previously 1.5 cm with max SUV 4.2, not appreciably changed in size, mildly decreased in metabolism. No new hypermetabolic pulmonary findings. Enlarged bilateral axillary lymph nodes are stable in size and stable to mildly increased in metabolism. Representative 1.2 cm right axillary node with max SUV 3.3 (series 4/image 44), previously 1.2 cm with max SUV 2.0, mildly increased in metabolism. Representative mildly enlarged 1.4 cm left axillary node with max SUV 1.6 (series 4/image 58), previously 1.4 cm with max SUV 1.9, not appreciably changed. Mildly enlarged 1.0 cm hypermetabolic subcarinal node with max SUV 4.1 (series 4/image 67), previously 0.9 cm with max SUV 2.8, stable in size, mildly increased in metabolism. Incidental CT findings: No acute consolidative airspace disease, lung masses or new significant pulmonary nodules. Left upper lobe 4 mm solid pulmonary nodule (series 8/image 25) is stable and below PET resolution. Right PICC terminates at the cavoatrial junction. Three-vessel coronary atherosclerosis. Mildly atherosclerotic nonaneurysmal thoracic aorta. ABDOMEN/PELVIS: No enlarged or hypermetabolic abdominal lymph nodes. Mildly enlarged and mildly hypermetabolic bilateral  common and external iliac lymph nodes, stable in size and minimally increased in metabolism. Representative 1.1 cm right common iliac node with max SUV 2.8 (series 4/image 143), previously 1.2 cm with max SUV 2.5. Representative 1.2 cm right external iliac node with max SUV 3.0 (series 4/image 160), previously 1.2 cm with max SUV 2.2, minimally increased metabolism. No abnormal hypermetabolic activity within the liver, pancreas, adrenal glands, or spleen. Incidental CT findings: Diffuse hepatic steatosis. Atherosclerotic nonaneurysmal abdominal aorta.  SKELETON: No focal hypermetabolic activity to suggest skeletal metastasis. Incidental CT findings: none IMPRESSION: 1. Interval mild mixed metabolic changes, as detailed. Persistent mildly hypermetabolic bilateral axillary, mediastinal and bilateral pelvic adenopathy and mildly hypermetabolic right lower lobe pulmonary nodule compatible with lymphoproliferative disorder. Deauville 4 based on the subcarinal node. 2.  Aortic Atherosclerosis (ICD10-I70.0). Electronically Signed   By: Ilona Sorrel M.D.   On: 04/19/2018 09:15    All questions were answered. The patient knows to call the clinic with any problems, questions or concerns. No barriers to learning was detected.  I spent 10 minutes counseling the patient face to face. The total time spent in the appointment was 15 minutes and more than 50% was on counseling and review of test results  Heath Lark, MD 05/07/2018 10:53 AM

## 2018-05-07 NOTE — Assessment & Plan Note (Signed)
I have reviewed her recent PET CT scan and discussed the case at the hematology tumor board Overall, she continues to have positive response to treatment I recommend we continue rituximab once a month and venetoclax at 200 mg daily, indefinitely or for minimum 2 years I will continue to repeat imaging study every 3 to 4 months for objective assessment of response to treatment

## 2018-05-07 NOTE — Patient Instructions (Signed)
[facility] Discharge Instructions for Patients Receiving Chemotherapy  Today you received the following chemotherapy agents:  Rituxan.  To help prevent nausea and vomiting after your treatment, we encourage you to take your nausea medication as directed.   If you develop nausea and vomiting that is not controlled by your nausea medication, call the clinic.   BELOW ARE SYMPTOMS THAT SHOULD BE REPORTED IMMEDIATELY:  *FEVER GREATER THAN 100.5 F  *CHILLS WITH OR WITHOUT FEVER  NAUSEA AND VOMITING THAT IS NOT CONTROLLED WITH YOUR NAUSEA MEDICATION  *UNUSUAL SHORTNESS OF BREATH  *UNUSUAL BRUISING OR BLEEDING  TENDERNESS IN MOUTH AND THROAT WITH OR WITHOUT PRESENCE OF ULCERS  *URINARY PROBLEMS  *BOWEL PROBLEMS  UNUSUAL RASH Items with * indicate a potential emergency and should be followed up as soon as possible.  Feel free to call the clinic should you have any questions or concerns. The clinic phone number is [phone].  Please show the CHEMO ALERT CARD at check-in to the Emergency Department and triage nurse.   

## 2018-05-07 NOTE — Assessment & Plan Note (Signed)
Examination is benign She is asymptomatic She will continue medical management She has no signs or symptoms of congestive heart failure ECHO is planned next week She has concerns about returning back to work due to the requirement to lift 40 pounds of weight if necessary I recommend she discuss this with her cardiologist after ECHO result is available

## 2018-05-12 ENCOUNTER — Other Ambulatory Visit: Payer: Self-pay

## 2018-05-12 ENCOUNTER — Ambulatory Visit (HOSPITAL_COMMUNITY): Payer: Self-pay | Attending: Cardiology

## 2018-05-12 DIAGNOSIS — E785 Hyperlipidemia, unspecified: Secondary | ICD-10-CM | POA: Insufficient documentation

## 2018-05-12 DIAGNOSIS — I481 Persistent atrial fibrillation: Secondary | ICD-10-CM | POA: Insufficient documentation

## 2018-05-12 DIAGNOSIS — I4891 Unspecified atrial fibrillation: Secondary | ICD-10-CM | POA: Insufficient documentation

## 2018-05-12 DIAGNOSIS — I4819 Other persistent atrial fibrillation: Secondary | ICD-10-CM

## 2018-05-12 DIAGNOSIS — I10 Essential (primary) hypertension: Secondary | ICD-10-CM | POA: Insufficient documentation

## 2018-05-12 DIAGNOSIS — I340 Nonrheumatic mitral (valve) insufficiency: Secondary | ICD-10-CM | POA: Insufficient documentation

## 2018-05-14 ENCOUNTER — Inpatient Hospital Stay: Payer: Self-pay

## 2018-05-14 DIAGNOSIS — C9110 Chronic lymphocytic leukemia of B-cell type not having achieved remission: Secondary | ICD-10-CM

## 2018-05-14 DIAGNOSIS — Z452 Encounter for adjustment and management of vascular access device: Secondary | ICD-10-CM

## 2018-05-14 DIAGNOSIS — Z95828 Presence of other vascular implants and grafts: Secondary | ICD-10-CM

## 2018-05-14 MED ORDER — HEPARIN SOD (PORK) LOCK FLUSH 100 UNIT/ML IV SOLN
500.0000 [IU] | Freq: Once | INTRAVENOUS | Status: AC
  Administered 2018-05-14: 500 [IU]
  Filled 2018-05-14: qty 5

## 2018-05-14 MED ORDER — SODIUM CHLORIDE 0.9% FLUSH
10.0000 mL | Freq: Once | INTRAVENOUS | Status: AC
  Administered 2018-05-14: 10 mL
  Filled 2018-05-14: qty 10

## 2018-05-14 NOTE — Patient Instructions (Signed)
PICC Home Guide °A peripherally inserted central catheter (PICC) is a long, thin, flexible tube that is inserted into a vein in the upper arm. It is a form of intravenous (IV) access. It is considered to be a "central" line because the tip of the PICC ends in a large vein in your chest. This large vein is called the superior vena cava (SVC). The PICC tip ends in the SVC because there is a lot of blood flow in the SVC. This allows medicines and IV fluids to be quickly distributed throughout the body. The PICC is inserted using a sterile technique by a specially trained nurse or physician. After the PICC is inserted, a chest X-ray exam is done to be sure it is in the correct place. °A PICC may be placed for different reasons, such as: °· To give medicines and liquid nutrition that can only be given through a central line. Examples are: °? Certain antibiotic treatments. °? Chemotherapy. °? Total parenteral nutrition (TPN). °· To take frequent blood samples. °· To give IV fluids and blood products. °· If there is difficulty placing a peripheral intravenous (PIV) catheter. ° °If taken care of properly, a PICC can remain in place for several months. A PICC can also allow a person to go home from the hospital early. Medicine and PICC care can be managed at home by a family member or home health care team. °What problems can happen when I have a PICC? °Problems with a PICC can occasionally occur. These may include the following: °· A blood clot (thrombus) forming in or at the tip of the PICC. This can cause the PICC to become clogged. A clot-dissolving medicine called tissue plasminogen activator (tPA) can be given through the PICC to help break up the clot. °· Inflammation of the vein (phlebitis) in which the PICC is placed. Signs of inflammation may include redness, pain at the insertion site, red streaks, or being able to feel a "cord" in the vein where the PICC is located. °· Infection in the PICC or at the insertion  site. Signs of infection may include fever, chills, redness, swelling, or pus drainage from the PICC insertion site. °· PICC movement (malposition). The PICC tip may move from its original position due to excessive physical activity, forceful coughing, sneezing, or vomiting. °· A break or cut in the PICC. It is important to not use scissors near the PICC. °· Nerve or tendon irritation or injury during PICC insertion. ° °What should I keep in mind about activities when I have a PICC? °· You may bend your arm and move it freely. If your PICC is near or at the bend of your elbow, avoid activity with repeated motion at the elbow. °· Rest at home for the remainder of the day following PICC line insertion. °· Avoid lifting heavy objects as instructed by your health care provider. °· Avoid using a crutch with the arm on the same side as your PICC. You may need to use a walker. °What should I know about my PICC dressing? °· Keep your PICC bandage (dressing) clean and dry to prevent infection. °? Ask your health care provider when you may shower. Ask your health care provider to teach you how to wrap the PICC when you do take a shower. °· Change the PICC dressing as instructed by your health care provider. °· Change your PICC dressing if it becomes loose or wet. °What should I know about PICC care? °· Check the PICC insertion   site daily for leakage, redness, swelling, or pain. °· Do not take a bath, swim, or use hot tubs when you have a PICC. Cover PICC line with clear plastic wrap and tape to keep it dry while showering. °· Flush the PICC as directed by your health care provider. Let your health care provider know right away if the PICC is difficult to flush or does not flush. Do not use force to flush the PICC. °· Do not use a syringe that is less than 10 mL to flush the PICC. °· Never pull or tug on the PICC. °· Avoid blood pressure checks on the arm with the PICC. °· Keep your PICC identification card with you at all  times. °· Do not take the PICC out yourself. Only a trained clinical professional should remove the PICC. °Get help right away if: °· Your PICC is accidentally pulled all the way out. If this happens, cover the insertion site with a bandage or gauze dressing. Do not throw the PICC away. Your health care provider will need to inspect it. °· Your PICC was tugged or pulled and has partially come out. Do not  push the PICC back in. °· There is any type of drainage, redness, or swelling where the PICC enters the skin. °· You cannot flush the PICC, it is difficult to flush, or the PICC leaks around the insertion site when it is flushed. °· You hear a "flushing" sound when the PICC is flushed. °· You have pain, discomfort, or numbness in your arm, shoulder, or jaw on the same side as the PICC. °· You feel your heart "racing" or skipping beats. °· You notice a hole or tear in the PICC. °· You develop chills or a fever. °This information is not intended to replace advice given to you by your health care provider. Make sure you discuss any questions you have with your health care provider. °Document Released: 03/29/2003 Document Revised: 04/11/2016 Document Reviewed: 07/15/2013 °[employer] Interactive Patient Education © 2017 [employer] ° °

## 2018-05-21 ENCOUNTER — Inpatient Hospital Stay: Payer: Self-pay

## 2018-05-21 DIAGNOSIS — Z95828 Presence of other vascular implants and grafts: Secondary | ICD-10-CM

## 2018-05-21 DIAGNOSIS — C9110 Chronic lymphocytic leukemia of B-cell type not having achieved remission: Secondary | ICD-10-CM

## 2018-05-21 DIAGNOSIS — Z452 Encounter for adjustment and management of vascular access device: Secondary | ICD-10-CM

## 2018-05-21 MED ORDER — HEPARIN SOD (PORK) LOCK FLUSH 100 UNIT/ML IV SOLN
250.0000 [IU] | Freq: Once | INTRAVENOUS | Status: AC
  Administered 2018-05-21: 250 [IU]
  Filled 2018-05-21: qty 5

## 2018-05-21 MED ORDER — SODIUM CHLORIDE 0.9% FLUSH
10.0000 mL | Freq: Once | INTRAVENOUS | Status: AC
  Administered 2018-05-21: 10 mL
  Filled 2018-05-21: qty 10

## 2018-05-28 ENCOUNTER — Inpatient Hospital Stay: Payer: Self-pay

## 2018-05-28 DIAGNOSIS — Z95828 Presence of other vascular implants and grafts: Secondary | ICD-10-CM

## 2018-05-28 DIAGNOSIS — C9110 Chronic lymphocytic leukemia of B-cell type not having achieved remission: Secondary | ICD-10-CM

## 2018-05-28 DIAGNOSIS — Z452 Encounter for adjustment and management of vascular access device: Secondary | ICD-10-CM

## 2018-05-28 LAB — COMPREHENSIVE METABOLIC PANEL
ALBUMIN: 4.1 g/dL (ref 3.5–5.0)
ALK PHOS: 91 U/L (ref 38–126)
ALT: 20 U/L (ref 0–44)
AST: 19 U/L (ref 15–41)
Anion gap: 8 (ref 5–15)
BUN: 10 mg/dL (ref 6–20)
CALCIUM: 9.7 mg/dL (ref 8.9–10.3)
CO2: 27 mmol/L (ref 22–32)
CREATININE: 0.72 mg/dL (ref 0.44–1.00)
Chloride: 107 mmol/L (ref 98–111)
GFR calc Af Amer: >60 mL/min (ref >60)
GFR calc non Af Amer: >60 mL/min (ref >60)
GLUCOSE: 99 mg/dL (ref 70–99)
Potassium: 3.6 mmol/L (ref 3.5–5.1)
Sodium: 142 mmol/L (ref 135–145)
Total Bilirubin: 0.8 mg/dL (ref 0.3–1.2)
Total Protein: 6.5 g/dL (ref 6.5–8.1)

## 2018-05-28 LAB — CBC WITH DIFFERENTIAL/PLATELET
BASOS ABS: 0 10*3/uL (ref 0.0–0.1)
Basophils Relative: 0 %
EOS ABS: 0 10*3/uL (ref 0.0–0.5)
Eosinophils Relative: 0 %
HCT: 33.9 % — ABNORMAL LOW (ref 34.8–46.6)
HEMOGLOBIN: 11.3 g/dL — AB (ref 11.6–15.9)
LYMPHS ABS: 1.7 10*3/uL (ref 0.9–3.3)
Lymphocytes Relative: 48 %
MCH: 28.9 pg (ref 25.1–34.0)
MCHC: 33.3 g/dL (ref 31.5–36.0)
MCV: 86.7 fL (ref 79.5–101.0)
Monocytes Absolute: 0.5 10*3/uL (ref 0.1–0.9)
Monocytes Relative: 13 %
NEUTROS PCT: 39 %
Neutro Abs: 1.4 10*3/uL — ABNORMAL LOW (ref 1.5–6.5)
Platelets: 200 10*3/uL (ref 145–400)
RBC: 3.91 MIL/uL (ref 3.70–5.45)
RDW: 14.1 % (ref 11.2–14.5)
WBC: 3.6 10*3/uL — AB (ref 3.9–10.3)

## 2018-05-28 LAB — LACTATE DEHYDROGENASE: LDH: 181 U/L (ref 98–192)

## 2018-05-28 MED ORDER — HEPARIN SOD (PORK) LOCK FLUSH 100 UNIT/ML IV SOLN
250.0000 [IU] | Freq: Once | INTRAVENOUS | Status: DC
  Filled 2018-05-28: qty 5

## 2018-05-28 MED ORDER — SODIUM CHLORIDE 0.9% FLUSH
10.0000 mL | Freq: Once | INTRAVENOUS | Status: DC
  Filled 2018-05-28: qty 10

## 2018-05-28 NOTE — Patient Instructions (Signed)

## 2018-06-04 ENCOUNTER — Inpatient Hospital Stay (HOSPITAL_BASED_OUTPATIENT_CLINIC_OR_DEPARTMENT_OTHER): Payer: Self-pay | Admitting: Hematology and Oncology

## 2018-06-04 ENCOUNTER — Encounter: Payer: Self-pay | Admitting: Hematology and Oncology

## 2018-06-04 ENCOUNTER — Telehealth: Payer: Self-pay | Admitting: Hematology and Oncology

## 2018-06-04 ENCOUNTER — Telehealth: Payer: Self-pay | Admitting: Cardiology

## 2018-06-04 ENCOUNTER — Inpatient Hospital Stay: Payer: Self-pay

## 2018-06-04 VITALS — BP 109/75 | HR 57 | Temp 97.7°F | Resp 16

## 2018-06-04 DIAGNOSIS — Z9221 Personal history of antineoplastic chemotherapy: Secondary | ICD-10-CM

## 2018-06-04 DIAGNOSIS — I428 Other cardiomyopathies: Secondary | ICD-10-CM

## 2018-06-04 DIAGNOSIS — C9110 Chronic lymphocytic leukemia of B-cell type not having achieved remission: Secondary | ICD-10-CM

## 2018-06-04 DIAGNOSIS — D61818 Other pancytopenia: Secondary | ICD-10-CM

## 2018-06-04 MED ORDER — DIPHENHYDRAMINE HCL 25 MG PO CAPS
50.0000 mg | ORAL_CAPSULE | Freq: Once | ORAL | Status: AC
  Administered 2018-06-04: 50 mg via ORAL

## 2018-06-04 MED ORDER — LIDOCAINE-PRILOCAINE 2.5-2.5 % EX CREA: 1.0000 "application " | TOPICAL_CREAM | CUTANEOUS | 6 refills | Status: AC | PRN

## 2018-06-04 MED ORDER — ACETAMINOPHEN 325 MG PO TABS
ORAL_TABLET | ORAL | Status: AC
  Filled 2018-06-04: qty 2

## 2018-06-04 MED ORDER — DIPHENHYDRAMINE HCL 25 MG PO CAPS
ORAL_CAPSULE | ORAL | Status: AC
  Filled 2018-06-04: qty 2

## 2018-06-04 MED ORDER — FAMOTIDINE IN NACL 20-0.9 MG/50ML-% IV SOLN
20.0000 mg | Freq: Once | INTRAVENOUS | Status: AC
  Administered 2018-06-04: 20 mg via INTRAVENOUS

## 2018-06-04 MED ORDER — SODIUM CHLORIDE 0.9 % IV SOLN
Freq: Once | INTRAVENOUS | Status: AC
  Administered 2018-06-04: 11:00:00 via INTRAVENOUS
  Filled 2018-06-04: qty 250

## 2018-06-04 MED ORDER — ACETAMINOPHEN 325 MG PO TABS
650.0000 mg | ORAL_TABLET | Freq: Once | ORAL | Status: AC
  Administered 2018-06-04: 650 mg via ORAL

## 2018-06-04 MED ORDER — FAMOTIDINE IN NACL 20-0.9 MG/50ML-% IV SOLN
INTRAVENOUS | Status: AC
  Filled 2018-06-04: qty 50

## 2018-06-04 MED ORDER — SODIUM CHLORIDE 0.9 % IV SOLN
375.0000 mg/m2 | Freq: Once | INTRAVENOUS | Status: AC
  Administered 2018-06-04: 800 mg via INTRAVENOUS
  Filled 2018-06-04: qty 30

## 2018-06-04 NOTE — Progress Notes (Signed)
OK to treat with labs from last week per Dr Alvy Bimler.

## 2018-06-04 NOTE — Progress Notes (Signed)
RUE PICC removed without difficulty and documented in flowsheet. Patient tolerated well.  Hemostasis achieved with direct pressure. Petroleum gauze and 4x4 applied and secured with tape. Monitored site for 30 mins prior to discharge with no signs of bleeding.

## 2018-06-04 NOTE — Telephone Encounter (Signed)
Gave patient avs and calendar.  Ok to add to NG schedule per los.

## 2018-06-04 NOTE — Patient Instructions (Addendum)
[facility] Discharge Instructions for Patients Receiving Chemotherapy  Today you received the following chemotherapy agents rituximab.  To help prevent nausea and vomiting after your treatment, we encourage you to take your nausea medication as directed.   If you develop nausea and vomiting that is not controlled by your nausea medication, call the clinic.   BELOW ARE SYMPTOMS THAT SHOULD BE REPORTED IMMEDIATELY:  *FEVER GREATER THAN 100.5 F  *CHILLS WITH OR WITHOUT FEVER  NAUSEA AND VOMITING THAT IS NOT CONTROLLED WITH YOUR NAUSEA MEDICATION  *UNUSUAL SHORTNESS OF BREATH  *UNUSUAL BRUISING OR BLEEDING  TENDERNESS IN MOUTH AND THROAT WITH OR WITHOUT PRESENCE OF ULCERS  *URINARY PROBLEMS  *BOWEL PROBLEMS  UNUSUAL RASH Items with * indicate a potential emergency and should be followed up as soon as possible.  Feel free to call the clinic should you have any questions or concerns. The clinic phone number is [phone].  Please show the [facility] at check-in to the Emergency Department and triage nurse.  PICC Removal, Care After Refer to this sheet in the next few weeks. These instructions provide you with information on caring for yourself after your procedure. Your health care provider may also give you more specific instructions. Your treatment has been planned according to current medical practices, but problems sometimes occur. Call your health care provider if you have any problems or questions after your procedure. What can I expect after the procedure? After your procedure, it is typical to have mild discomfort at the insertion site. This should not last for more than a day. Follow these instructions at home: You may remove the bandage after 24 hours. The PICC insertion site is very small. A small scab may develop over the insertion site. It is okay to wash the site gently with soap and water. Be careful not to remove or pick off the scab. Gently  pat the site dry after washing it. You do not need to put another bandage over the insertion site. Do not lift anything heavy or do strenuous physical activity for 24 hours after the PICC is removed. This includes:  Weight lifting.  Strenuous yard work.  Any physical activity with repetitive arm movement.  Contact a health care provider if:  You have swelling or puffiness in your arm at the PICC insertion site.  You have increasing tenderness at the PICC insertion site. Get help right away if:  You have numbness or tingling in your fingers, hand, or arm.  Your arm looks blue and feels cold.  You have redness around the insertion site or a red streak goes up your arm.  You have any type of drainage from the PICC insertion site. This includes drainage such as: ? Bleeding from the insertion site. If this happens, apply firm, direct pressure to the PICC insertion site with a clean towel. ? Drainage that is yellow or tan.  You have a fever. This information is not intended to replace advice given to you by your health care provider. Make sure you discuss any questions you have with your health care provider. Document Released: 09/27/2013 Document Revised: 02/28/2016 Document Reviewed: 07/15/2013 Elsevier Interactive Patient Education  2017 [employer].

## 2018-06-04 NOTE — Telephone Encounter (Signed)
New Message:     Tammi calling from [facility] wants to know if there is anyway pt can get help with her Entresto and Eliquis please.

## 2018-06-05 ENCOUNTER — Encounter: Payer: Self-pay | Admitting: Hematology and Oncology

## 2018-06-05 DIAGNOSIS — D61818 Other pancytopenia: Secondary | ICD-10-CM | POA: Insufficient documentation

## 2018-06-05 NOTE — Assessment & Plan Note (Signed)
She tolerated treatment well without side effects Recent PET CT scan show positive response to therapy I plan to repeat imaging study again in October In the meantime, she will continue on venetoclax and monthly rituximab I recommend discontinuation of PICC line and switch her to port placement for treatment next month

## 2018-06-05 NOTE — Assessment & Plan Note (Signed)
She had acquired pancytopenia due to recent treatment but not symptomatic I recommend observation only.

## 2018-06-05 NOTE — Assessment & Plan Note (Signed)
Recent echocardiogram show resolution of cardiomyopathy She will continue cardiac management I recommend her to return back to work gradually this month to be transitioned back to full job responsibility by October She is reminded to hold anticoagulation therapy for 48 hours before port placement

## 2018-06-05 NOTE — Progress Notes (Signed)
[place] OFFICE PROGRESS NOTE  Patient Care Team: Janith Lima, MD as PCP - General (Internal Medicine) Constance Haw, MD as Consulting Physician (Cardiology)  ASSESSMENT & PLAN:  CLL (chronic lymphocytic leukemia) ([place]) She tolerated treatment well without side effects Recent PET CT scan show positive response to therapy I plan to repeat imaging study again in October In the meantime, she will continue on venetoclax and monthly rituximab I recommend discontinuation of PICC line and switch her to port placement for treatment next month  Nonischemic cardiomyopathy ([place]) Recent echocardiogram show resolution of cardiomyopathy She will continue cardiac management I recommend her to return back to work gradually this month to be transitioned back to full job responsibility by October She is reminded to hold anticoagulation therapy for 48 hours before port placement  Pancytopenia, acquired [facility]) She had acquired pancytopenia due to recent treatment but not symptomatic I recommend observation only.   No orders of the defined types were placed in this encounter.   INTERVAL HISTORY: Please see below for problem oriented charting. She returns with her husband for further follow-up She feels well No recent infection, fever or chills Recent echocardiogram showed complete resolution of prior cardiomyopathy She desire to return back to work  SUMMARY OF ONCOLOGIC HISTORY:   CLL (chronic lymphocytic leukemia) ([place])   04/05/2015 Pathology Results    Accession: [id number] flow cytometry confirmed CLL. FISH was positive for p53 mutation    04/24/2015 Imaging    Extensive lymphadenopathy throughout the neck, chest (axilla), abdomen and pelvis, as detailed above, compatible with the reported clinical history of lymphoma. 2. Mild splenomegaly.    05/03/2015 - 08/27/2015 Chemotherapy    She started on Ibrutinib, discontinued prematurely when her prescription ran out    10/10/2015 - 05/29/2017 Chemotherapy    She was restarted back on Ibrutinib    11/13/2016 PET scan    Significant generalized reduction in size of numerous lymph nodes in the neck, chest, abdomen, and pelvis. Previously the activity of these nodes was low-level and in general a similar low-level activity is present today, significantly less than mediastinal blood pool activity, compatible with Deauville 2. 2. Coronary atherosclerosis. Mild cardiomegaly. 3. Mildly prominent endometrium for age without accentuated metabolic activity in the endometrium. Consider pelvic sonography for further characterization.    05/27/2017 PET scan    1. Progressive hypermetabolic adenopathy, primarily involving cervical, axillary, pelvic and inguinal lymph nodes bilaterally, consistent with progressive lymphoma. 2. No solid visceral organ or osseous involvement.    06/16/2017 - 08/25/2017 Chemotherapy    The patient had 3 cycles of Rituximab and Bendamustine    07/15/2017 - 07/19/2017 Hospital Admission    The patient was briefly admitted to the hospital due to infusion reaction to rituximab    09/18/2017 PET scan    1. Continued considerable adenopathy in the neck, chest, abdomen, and pelvis. This is generally stable in size but mildly reduced in activity compared to the prior exam. Current levels of activity primarily Deauville 3 and Deauville 4. No splenomegaly. 2. Diffuse new ground-glass opacities in the lungs with associated hypermetabolic activity. Some forms of lymphoma infiltration can rarely cause this pattern of diffuse ground-glass opacity and hypermetabolic activity. Differential diagnostic considerations might include atypical pneumonia such as mycoplasma, acute hypersensitivity pneumonitis, or acute eosinophilic pneumonia. Pulmonary hemorrhage seems less likely to cause this degree of accentuated metabolic activity.  3. Aortic Atherosclerosis (ICD10-I70.0). Coronary atherosclerosis.    09/24/2017 -   Chemotherapy    The patient  had ventoclax for chemotherapy treatment.  Rituximab is added on 11/18/17 to 04/09/18, x 6 cycles    11/19/2017 PET scan    Overall mild interval decrease in hypermetabolic lymphadenopathy throughout the neck, chest, abdomen, and pelvis. No new or increased lymphadenopathy identified.  While there has been resolution of diffuse hypermetabolic bilateral ground-glass pulmonary opacity since prior study, there is a new 14 mm hypermetabolic pulmonary nodule in the posterior right lower lobe. Time course favors inflammatory or infectious etiology over neoplasm. Recommend continued follow-up by chest CT in 3 months.    02/16/2018 PET scan    1. Adenopathy in the neck, chest, and pelvis is stable to minimally reduced in size, and is moderately reduced in activity, primarily Deauville 2 disease today. There is a right common iliac lymph node qualifying as Deauville 3 disease which is stable in size but reduced in activity. 2. Stable size but reduced activity in a pulmonary nodule in the right lower lobe. If this represents a leukemic lesion then a corresponds to Deauville 4 disease. This lesion was not readily apparent on 09/22/2017 but was reported on the prior chest CT of 11/19/2017. 3. Other imaging findings of potential clinical significance: Mild thyroid goiter. Aortic Atherosclerosis (ICD10-I70.0). Coronary atherosclerosis. Mild cardiomegaly.    04/19/2018 PET scan    1. Interval mild mixed metabolic changes, as detailed. Persistent mildly hypermetabolic bilateral axillary, mediastinal and bilateral pelvic adenopathy and mildly hypermetabolic right lower lobe pulmonary nodule compatible with lymphoproliferative disorder. Deauville 4 based on the subcarinal node. 2. Aortic Atherosclerosis (ICD10-I70.0).     REVIEW OF SYSTEMS:   Constitutional: Denies fevers, chills or abnormal weight loss Eyes: Denies blurriness of vision Ears, nose, mouth, throat, and face: Denies  mucositis or sore throat Respiratory: Denies cough, dyspnea or wheezes Cardiovascular: Denies palpitation, chest discomfort or lower extremity swelling Gastrointestinal:  Denies nausea, heartburn or change in bowel habits Skin: Denies abnormal skin rashes Lymphatics: Denies new lymphadenopathy or easy bruising Neurological:Denies numbness, tingling or new weaknesses Behavioral/Psych: Mood is stable, no new changes  All other systems were reviewed with the patient and are negative.  I have reviewed the past medical history, past surgical history, social history and family history with the patient and they are unchanged from previous note.  ALLERGIES:  has No Known Allergies.  MEDICATIONS:  Current Outpatient Medications  Medication Sig Dispense Refill  . allopurinol (ZYLOPRIM) 300 MG tablet Take 1 tablet (300 mg total) by mouth daily. 60 tablet 1  . Cholecalciferol (VITAMIN D-3) 1000 units CAPS Take 1,000 Units by mouth daily with breakfast.    . ELIQUIS 5 MG TABS tablet TAKE 1 TABLET BY MOUTH TWICE A DAY 180 tablet 1  . furosemide (LASIX) 40 MG tablet TAKE 1 TABLET BY MOUTH EVERY DAY (Patient taking differently: Take 40 mg by mouth once a day) 90 tablet 3  . lidocaine-prilocaine (EMLA) cream Apply 1 application topically as needed. 30 g 6  . metoprolol succinate (TOPROL-XL) 100 MG 24 hr tablet Take 1 1/2 tablets (150 mg total) daily. Take with or immediately following a meal. 135 tablet 3  . potassium chloride SA (K-DUR,KLOR-CON) 20 MEQ tablet Take 1 tablet (20 mEq total) by mouth 2 (two) times daily. 14 tablet 0  . sacubitril-valsartan (ENTRESTO) 24-26 MG Take 1 tablet by mouth 2 (two) times daily. 180 tablet 3  . venetoclax (VENCLEXTA) 100 MG TABS Take 200 mg by mouth daily. 120 tablet 9   No current facility-administered medications for this visit.  Facility-Administered Medications Ordered in Other Visits  Medication Dose Route Frequency Provider Last Rate Last Dose  . sodium  chloride flush (NS) 0.9 % injection 3 mL  3 mL Intravenous PRN Alvy Bimler, Madisin Hasan, MD   10 mL at 04/09/18 1300    PHYSICAL EXAMINATION: ECOG PERFORMANCE STATUS: 0 - Asymptomatic  Vitals:   06/04/18 0954  BP: 132/74  Pulse: (!) 58  Resp: 14  Temp: 97.6 F (36.4 C)  SpO2: 100%   Filed Weights   06/04/18 0954  Weight: 208 lb 12.8 oz (94.7 kg)    GENERAL:alert, no distress and comfortable SKIN: skin color, texture, turgor are normal, no rashes or significant lesions EYES: normal, Conjunctiva are pink and non-injected, sclera clear OROPHARYNX:no exudate, no erythema and lips, buccal mucosa, and tongue normal  NECK: supple, thyroid normal size, non-tender, without nodularity LYMPH:  no palpable lymphadenopathy in the cervical, axillary or inguinal LUNGS: clear to auscultation and percussion with normal breathing effort HEART: regular rate & rhythm and no murmurs and no lower extremity edema ABDOMEN:abdomen soft, non-tender and normal bowel sounds Musculoskeletal:no cyanosis of digits and no clubbing  NEURO: alert & oriented x 3 with fluent speech, no focal motor/sensory deficits  LABORATORY DATA:  I have reviewed the data as listed    Component Value Date/Time   NA 142 05/28/2018 0832   NA 140 10/09/2017 1416   K 3.6 05/28/2018 0832   K 4.4 10/09/2017 1416   CL 107 05/28/2018 0832   CL 105 01/18/2013 0912   CO2 27 05/28/2018 0832   CO2 25 10/09/2017 1416   GLUCOSE 99 05/28/2018 0832   GLUCOSE 117 10/09/2017 1416   GLUCOSE 83 01/18/2013 0912   BUN 10 05/28/2018 0832   BUN 11.1 10/09/2017 1416   CREATININE 0.72 05/28/2018 0832   CREATININE 0.8 10/09/2017 1416   CALCIUM 9.7 05/28/2018 0832   CALCIUM 9.6 10/09/2017 1416   PROT 6.5 05/28/2018 0832   PROT 6.4 10/09/2017 1416   ALBUMIN 4.1 05/28/2018 0832   ALBUMIN 3.4 (L) 10/09/2017 1416   AST 19 05/28/2018 0832   AST 18 10/09/2017 1416   ALT 20 05/28/2018 0832   ALT 19 10/09/2017 1416   ALKPHOS 91 05/28/2018 0832   ALKPHOS  69 10/09/2017 1416   BILITOT 0.8 05/28/2018 0832   BILITOT 0.57 10/09/2017 1416   GFRNONAA >60 05/28/2018 0832   GFRAA >60 05/28/2018 0832    No results found for: SPEP, UPEP  Lab Results  Component Value Date   WBC 3.6 (L) 05/28/2018   NEUTROABS 1.4 (L) 05/28/2018   HGB 11.3 (L) 05/28/2018   HCT 33.9 (L) 05/28/2018   MCV 86.7 05/28/2018   PLT 200 05/28/2018      Chemistry      Component Value Date/Time   NA 142 05/28/2018 0832   NA 140 10/09/2017 1416   K 3.6 05/28/2018 0832   K 4.4 10/09/2017 1416   CL 107 05/28/2018 0832   CL 105 01/18/2013 0912   CO2 27 05/28/2018 0832   CO2 25 10/09/2017 1416   BUN 10 05/28/2018 0832   BUN 11.1 10/09/2017 1416   CREATININE 0.72 05/28/2018 0832   CREATININE 0.8 10/09/2017 1416      Component Value Date/Time   CALCIUM 9.7 05/28/2018 0832   CALCIUM 9.6 10/09/2017 1416   ALKPHOS 91 05/28/2018 0832   ALKPHOS 69 10/09/2017 1416   AST 19 05/28/2018 0832   AST 18 10/09/2017 1416   ALT 20 05/28/2018 0832   ALT  19 10/09/2017 1416   BILITOT 0.8 05/28/2018 0832   BILITOT 0.57 10/09/2017 1416       All questions were answered. The patient knows to call the clinic with any problems, questions or concerns. No barriers to learning was detected.  I spent 15 minutes counseling the patient face to face. The total time spent in the appointment was 20 minutes and more than 50% was on counseling and review of test results  Heath Lark, MD 06/05/2018 6:39 AM

## 2018-06-08 ENCOUNTER — Other Ambulatory Visit: Payer: Self-pay | Admitting: Hematology and Oncology

## 2018-06-08 DIAGNOSIS — C9110 Chronic lymphocytic leukemia of B-cell type not having achieved remission: Secondary | ICD-10-CM

## 2018-06-08 NOTE — Telephone Encounter (Signed)
I called the pts pharmacy ([facility]) and was advised that the pt has no insurance and that is why her Eliquis and [name] is so expensive.  I called the pt and offered to mail her pt asst applications for both medications. She is concerned that she may have made too much money in 2018 to be approved. I have advised her that the phone numbers to reach both [facility] and [employer] is listed on the pt asst applications and that she should contact them prior to applying with questions. She verbalized understanding.  Also, I have asked her to call us if she finds out that she is not eligible.

## 2018-06-11 ENCOUNTER — Telehealth: Payer: Self-pay

## 2018-06-11 NOTE — Telephone Encounter (Signed)
Spoke with Tiffany in IR.  Pt has been scheduled for port placement on 06/29/18.  Arrival time of 1pm. NPO after 0800.  Hold Eliquis starting 06/27/18 (last dose 06/26/18).  Pt is aware of all instructions and verbalizes understanding.  Per pt request, a letter has been faxed to her employer stating the conditions of her return to work.

## 2018-06-15 ENCOUNTER — Other Ambulatory Visit: Payer: Self-pay

## 2018-06-15 MED ORDER — APIXABAN 5 MG PO TABS: 5.0000 mg | ORAL_TABLET | Freq: Two times a day (BID) | ORAL | 3 refills | Status: DC

## 2018-06-15 NOTE — Telephone Encounter (Signed)
**Note De-Identified  Obfuscation** The pt returned her completed [facility] pt asst application for Entresto and her [facility] pt asst application for Eliquis. Dr Curt Bears has signed both and I have faxed the [facility] application to [facility] and the Eliquis application to [employer].

## 2018-06-18 ENCOUNTER — Other Ambulatory Visit: Payer: Self-pay | Admitting: Cardiology

## 2018-06-21 NOTE — Telephone Encounter (Signed)
**Note De-Identified Audrey Gross Obfuscation** Letter received Audrey Gross fax from [employer] stating that they have approved the pt for Pt Asst with Entresto. Approval good until 06/18/2019.

## 2018-06-28 ENCOUNTER — Other Ambulatory Visit: Payer: Self-pay | Admitting: Student

## 2018-06-28 MED FILL — LIDOCAINE-PRILOCAINE CREAM: 2.5-2.5 | 10 days supply | Qty: 30 | Fill #0

## 2018-06-28 NOTE — Telephone Encounter (Signed)
The pt states that she called [place] and was advised that they never received her patient assistance application from [place].  I advised the pt that I faxed her application into [employer] on 06/15/18 and that I received confirmation that the fax was successful.  I also advised her that I will re-fax her application to [employer] and that I will leave her 2 boxes of Eliquis samples in the front office for her to pick up.  She verbalized understanding and thanked me foe my assistance.

## 2018-06-29 ENCOUNTER — Other Ambulatory Visit: Payer: Self-pay

## 2018-06-29 ENCOUNTER — Ambulatory Visit (HOSPITAL_COMMUNITY)
Admission: RE | Admit: 2018-06-29 | Discharge: 2018-06-29 | Disposition: A | Discharge status: Home / Self Care | Payer: Self-pay | Source: Ambulatory Visit | Attending: Hematology and Oncology | Admitting: Hematology and Oncology

## 2018-06-29 ENCOUNTER — Encounter (HOSPITAL_COMMUNITY): Payer: Self-pay

## 2018-06-29 DIAGNOSIS — E785 Hyperlipidemia, unspecified: Secondary | ICD-10-CM | POA: Insufficient documentation

## 2018-06-29 DIAGNOSIS — Z7901 Long term (current) use of anticoagulants: Secondary | ICD-10-CM | POA: Insufficient documentation

## 2018-06-29 DIAGNOSIS — I10 Essential (primary) hypertension: Secondary | ICD-10-CM | POA: Insufficient documentation

## 2018-06-29 DIAGNOSIS — F329 Major depressive disorder, single episode, unspecified: Secondary | ICD-10-CM | POA: Insufficient documentation

## 2018-06-29 DIAGNOSIS — C9110 Chronic lymphocytic leukemia of B-cell type not having achieved remission: Secondary | ICD-10-CM | POA: Diagnosis not present

## 2018-06-29 DIAGNOSIS — I878 Other specified disorders of veins: Secondary | ICD-10-CM | POA: Insufficient documentation

## 2018-06-29 DIAGNOSIS — I340 Nonrheumatic mitral (valve) insufficiency: Secondary | ICD-10-CM | POA: Insufficient documentation

## 2018-06-29 HISTORY — PX: IR IMAGING GUIDED PORT INSERTION: IMG5740

## 2018-06-29 LAB — CBC
HCT: 36.4 % (ref 36.0–46.0)
Hemoglobin: 12.2 g/dL (ref 12.0–15.0)
MCH: 29.5 pg (ref 26.0–34.0)
MCHC: 33.5 g/dL (ref 30.0–36.0)
MCV: 88.1 fL (ref 78.0–100.0)
PLATELETS: 270 10*3/uL (ref 150–400)
RBC: 4.13 MIL/uL (ref 3.87–5.11)
RDW: 14.4 % (ref 11.5–15.5)
WBC: 3.2 10*3/uL — ABNORMAL LOW (ref 4.0–10.5)

## 2018-06-29 LAB — PROTIME-INR
INR: 0.95
Prothrombin Time: 12.6 seconds (ref 11.4–15.2)

## 2018-06-29 LAB — APTT: APTT: 27 s (ref 24–36)

## 2018-06-29 MED ORDER — HEPARIN SOD (PORK) LOCK FLUSH 100 UNIT/ML IV SOLN
INTRAVENOUS | Status: AC
  Filled 2018-06-29: qty 5

## 2018-06-29 MED ORDER — SODIUM CHLORIDE 0.9 % IV SOLN
INTRAVENOUS | Status: DC
  Administered 2018-06-29: 13:00:00 via INTRAVENOUS

## 2018-06-29 MED ORDER — MIDAZOLAM HCL 2 MG/2ML IJ SOLN
INTRAMUSCULAR | Status: AC | PRN
  Administered 2018-06-29 (×2): 1 mg via INTRAVENOUS

## 2018-06-29 MED ORDER — LIDOCAINE-EPINEPHRINE (PF) 2 %-1:200000 IJ SOLN
INTRAMUSCULAR | Status: AC | PRN
  Administered 2018-06-29: 10 mL

## 2018-06-29 MED ORDER — CEFAZOLIN SODIUM-DEXTROSE 2-4 GM/100ML-% IV SOLN
2.0000 g | INTRAVENOUS | Status: AC
  Administered 2018-06-29: 2 g via INTRAVENOUS

## 2018-06-29 MED ORDER — LIDOCAINE-EPINEPHRINE (PF) 2 %-1:200000 IJ SOLN
INTRAMUSCULAR | Status: AC | PRN
  Administered 2018-06-29: 20 mL

## 2018-06-29 MED ORDER — MIDAZOLAM HCL 2 MG/2ML IJ SOLN
INTRAMUSCULAR | Status: AC
  Filled 2018-06-29: qty 4

## 2018-06-29 MED ORDER — LIDOCAINE-EPINEPHRINE (PF) 2 %-1:200000 IJ SOLN
INTRAMUSCULAR | Status: AC | PRN
  Administered 2018-06-29: 5 mL

## 2018-06-29 MED ORDER — CEFAZOLIN SODIUM-DEXTROSE 2-4 GM/100ML-% IV SOLN
INTRAVENOUS | Status: AC
  Filled 2018-06-29: qty 100

## 2018-06-29 MED ORDER — LIDOCAINE-EPINEPHRINE (PF) 2 %-1:200000 IJ SOLN
INTRAMUSCULAR | Status: AC
  Filled 2018-06-29: qty 20

## 2018-06-29 MED ORDER — FENTANYL CITRATE (PF) 100 MCG/2ML IJ SOLN
INTRAMUSCULAR | Status: AC | PRN
  Administered 2018-06-29 (×2): 50 ug via INTRAVENOUS

## 2018-06-29 MED ORDER — FENTANYL CITRATE (PF) 100 MCG/2ML IJ SOLN
INTRAMUSCULAR | Status: AC
  Filled 2018-06-29: qty 2

## 2018-06-29 NOTE — Consult Note (Signed)
Chief Complaint: Patient was seen in consultation today for Port-A-Cath placement  Referring Physician(s): Gorsuch,Ni  Supervising Physician: Sandi Mariscal  Patient Status: [name]  History of Present Illness: Audrey Gross is a 57 y.o. female with history of CLL who presents today for Port-A-Cath placement for additional chemotherapy.  Past Medical History:  Diagnosis Date  . Atrial fibrillation ([place])   . CLL (chronic lymphoblastic leukemia)    Leukemia  . Cystic breast   . Depression   . Diffuse cystic mastopathy   . Dyslipidemia (high LDL; low HDL)   . Hypertension   . Iron deficiency anemia, unspecified   . Lymphadenopathy of head and neck 04/05/2015  . Medical non-compliance   . Mitral regurgitation 02/2017   moderate to severe  . Persistent atrial fibrillation with rapid ventricular response ([place]) 07/15/2017    Past Surgical History:  Procedure Laterality Date  . CARDIOVERSION N/A 04/02/2017   Procedure: CARDIOVERSION;  Surgeon: Dorothy Spark, MD;  Location[facility] ENDOSCOPY;  Service: Cardiovascular;  Laterality: N/A;  . CARDIOVERSION N/A 08/19/2017   Procedure: CARDIOVERSION;  Surgeon: Larey Dresser, MD;  Location: [facility] ENDOSCOPY;  Service: Cardiovascular;  Laterality: N/A;  . TUBAL LIGATION      Allergies: Patient has no known allergies.  Medications: Prior to Admission medications   Medication Sig Start Date End Date Taking? Authorizing Provider  allopurinol (ZYLOPRIM) 300 MG tablet Take 1 tablet (300 mg total) by mouth daily. 01/20/18  Yes Gorsuch, Ni, MD  furosemide (LASIX) 40 MG tablet TAKE 1 TABLET BY MOUTH EVERY DAY Patient taking differently: Take 40 mg by mouth once a day 09/21/17  Yes Theora Gianotti, NP  metoprolol succinate (TOPROL-XL) 100 MG 24 hr tablet Take 1 1/2 tablets (150 mg total) daily. Take with or immediately following a meal. 12/31/17  Yes Camnitz, Will Hassell Done, MD  metoprolol succinate (TOPROL-XL) 100 MG 24 hr tablet  TAKE 1 TABLET BY MOUTH DAILY WITH OR IMMEDIATELY FOLLOWING A MEAL (TAKE WITH 50 MG TABLET) 06/18/18  Yes Camnitz, Will Hassell Done, MD  potassium chloride SA (K-DUR,KLOR-CON) 20 MEQ tablet Take 1 tablet (20 mEq total) by mouth 2 (two) times daily. 12/02/17  Yes Gorsuch, Ni, MD  sacubitril-valsartan (ENTRESTO) 24-26 MG Take 1 tablet by mouth 2 (two) times daily. 10/21/17  Yes Camnitz, Will Hassell Done, MD  venetoclax (VENCLEXTA) 100 MG TABS Take 200 mg by mouth daily. 11/12/17  Yes Heath Lark, MD  apixaban (ELIQUIS) 5 MG TABS tablet Take 1 tablet (5 mg total) by mouth 2 (two) times daily. 06/15/18   Camnitz, Ocie Doyne, MD  Cholecalciferol (VITAMIN D-3) 1000 units CAPS Take 1,000 Units by mouth daily with breakfast.    [provider]  lidocaine-prilocaine (EMLA) cream Apply 1 application topically as needed. 06/04/18   Heath Lark, MD     Family History  Problem Relation Age of Onset  . Sudden death Mother   . Kidney disease Father        H/O HD and kidney transplant  . Stomach cancer Maternal Aunt   . Stomach cancer Paternal Grandmother   . Heart disease Brother   . Heart disease Maternal Grandmother     Social History   Socioeconomic History  . Marital status: Married    Spouse name: Not on file  . Number of children: 6  . Years of education: Not on file  . Highest education level: Not on file  Occupational History  . Occupation: [employer] manager: [employer]  Social Needs  . Financial resource strain: Not on file  . Food insecurity:    Worry: Not on file    Inability: Not on file  . Transportation needs:    Medical: Not on file    Non-medical: Not on file  Tobacco Use  . Smoking status: Never Smoker  . Smokeless tobacco: Never Used  Substance and Sexual Activity  . Alcohol use: No  . Drug use: No  . Sexual activity: Not Currently    Partners: Male    Birth control/protection: Surgical  Lifestyle  . Physical activity:    Days per week: Not on file    Minutes per session:  Not on file  . Stress: Not on file  Relationships  . Social connections:    Talks on phone: Not on file    Gets together: Not on file    Attends religious service: Not on file    Active member of club or organization: Not on file    Attends meetings of clubs or organizations: Not on file    Relationship status: Not on file  Other Topics Concern  . Not on file  Social History Narrative  . Not on file      Review of Systems denies fever, headache, chest pain, dyspnea, cough, abdominal pain, back pain, nausea, vomiting or bleeding.  Vital Signs: BP 126/88 (BP Location: Right Arm)   Pulse (!) 58   Temp 98.1 F (36.7 C) (Oral)   Resp 18   SpO2 99%   Physical Exam awake, alert.  Chest clear to auscultation bilaterally.  Heart with slightly bradycardic but regular rhythm.  Abdomen obese, soft, positive bowel sounds, nontender.  No lower extremity edema.  Imaging: No results found.  Labs:  CBC: Recent Labs    04/29/18 0848 05/06/18 1229 05/28/18 0832 06/29/18 1310  WBC 2.6* 4.2 3.6* 3.2*  HGB 11.0* 11.6 11.3* 12.2  HCT 32.7* 35.8 33.9* 36.4  PLT 221 247 200 270    COAGS: Recent Labs    07/16/17 0103 06/29/18 1310  INR 1.40 0.95  APTT  --  27    BMP: Recent Labs    04/14/18 0852 04/21/18 0829 04/29/18 0848 05/28/18 0832  NA 143 142 144 142  K 3.7 3.5 3.2* 3.6  CL 107 108 109 107  CO2 27 25 25 27   GLUCOSE 99 101* 107* 99  BUN 10 10 10 10   CALCIUM 9.8 9.4 9.5 9.7  CREATININE 0.77 0.74 0.77 0.72  GFRNONAA >60 >60 >60 >60  GFRAA >60 >60 >60 >60    LIVER FUNCTION TESTS: Recent Labs    04/14/18 0852 04/21/18 0829 04/29/18 0848 05/28/18 0832  BILITOT 1.1 0.9 0.7 0.8  AST 30 19 18 19   ALT 35 21 14 20   ALKPHOS 90 93 91 91  PROT 6.7 6.6 6.1* 6.5  ALBUMIN 4.3 4.2 3.9 4.1    TUMOR MARKERS: No results for input(s): AFPTM, CEA, CA199, CHROMGRNA in the last 8760 hours.  Assessment and Plan: 57 y.o. female with history of CLL who presents today  for Port-A-Cath placement for additional chemotherapy.Risks and benefits of image guided port-a-catheter placement was discussed with the patient including, but not limited to bleeding, infection, pneumothorax, or fibrin sheath development and need for additional procedures.  All of the patient's questions were answered, patient is agreeable to proceed. Consent signed and in chart.     Thank you for this interesting consult.  I greatly enjoyed meeting SHAVONDA WIEDMAN and look forward  to participating in their care.  A copy of this report was sent to the requesting provider on this date.  Electronically Signed: D. Rowe Robert, PA-C 06/29/2018, 2:24 PM   I spent a total of 25 minutes    in face to face in clinical consultation, greater than 50% of which was counseling/coordinating care for port a cath placement

## 2018-06-29 NOTE — Discharge Instructions (Signed)
Moderate Conscious Sedation, Adult, Care After °These instructions provide you with information about caring for yourself after your procedure. Your health care provider may also give you more specific instructions. Your treatment has been planned according to current medical practices, but problems sometimes occur. Call your health care provider if you have any problems or questions after your procedure. °What can I expect after the procedure? °After your procedure, it is common: °· To feel sleepy for several hours. °· To feel clumsy and have poor balance for several hours. °· To have poor judgment for several hours. °· To vomit if you eat too soon. ° °Follow these instructions at home: °For at least 24 hours after the procedure: ° °· Do not: °? Participate in activities where you could fall or become injured. °? Drive. °? Use heavy machinery. °? Drink alcohol. °? Take sleeping pills or medicines that cause drowsiness. °? Make important decisions or sign legal documents. °? Take care of children on your own. °· Rest. °Eating and drinking °· Follow the diet recommended by your health care provider. °· If you vomit: °? Drink water, juice, or soup when you can drink without vomiting. °? Make sure you have little or no nausea before eating solid foods. °General instructions °· Have a responsible adult stay with you until you are awake and alert. °· Take over-the-counter and prescription medicines only as told by your health care provider. °· If you smoke, do not smoke without supervision. °· Keep all follow-up visits as told by your health care provider. This is important. °Contact a health care provider if: °· You keep feeling nauseous or you keep vomiting. °· You feel light-headed. °· You develop a rash. °· You have a fever. °Get help right away if: °· You have trouble breathing. °This information is not intended to replace advice given to you by your health care provider. Make sure you discuss any questions you have  with your health care provider. °Document Released: 07/13/2013 Document Revised: 02/25/2016 Document Reviewed: 01/12/2016 °Elsevier Interactive Patient Education © 2018 [employer] ° ° °Implanted Port Home Guide °An implanted port is a type of central line that is placed under the skin. Central lines are used to provide IV access when treatment or nutrition needs to be given through a person’s veins. Implanted ports are used for long-term IV access. An implanted port may be placed because: °· You need IV medicine that would be irritating to the small veins in your hands or arms. °· You need long-term IV medicines, such as antibiotics. °· You need IV nutrition for a long period. °· You need frequent blood draws for lab tests. °· You need dialysis. ° °Implanted ports are usually placed in the chest area, but they can also be placed in the upper arm, the abdomen, or the leg. An implanted port has two main parts: °· Reservoir. The reservoir is round and will appear as a small, raised area under your skin. The reservoir is the part where a needle is inserted to give medicines or draw blood. °· Catheter. The catheter is a thin, flexible tube that extends from the reservoir. The catheter is placed into a large vein. Medicine that is inserted into the reservoir goes into the catheter and then into the vein. ° °How will I care for my incision site? °Do not get the incision site wet. Bathe or shower as directed by your health care provider. °How is my port accessed? °Special steps must be taken to access the   port: °· Before the port is accessed, a numbing cream can be placed on the skin. This helps numb the skin over the port site. °· Your health care provider uses a sterile technique to access the port. °? Your health care provider must put on a mask and sterile gloves. °? The skin over your port is cleaned carefully with an antiseptic and allowed to dry. °? The port is gently pinched between sterile gloves, and a needle  is inserted into the port. °· Only "non-coring" port needles should be used to access the port. Once the port is accessed, a blood return should be checked. This helps ensure that the port is in the vein and is not clogged. °· If your port needs to remain accessed for a constant infusion, a clear (transparent) bandage will be placed over the needle site. The bandage and needle will need to be changed every week, or as directed by your health care provider. °· Keep the bandage covering the needle clean and dry. Do not get it wet. Follow your health care provider’s instructions on how to take a shower or bath while the port is accessed. °· If your port does not need to stay accessed, no bandage is needed over the port. ° °What is flushing? °Flushing helps keep the port from getting clogged. Follow your health care provider’s instructions on how and when to flush the port. Ports are usually flushed with saline solution or a medicine called heparin. The need for flushing will depend on how the port is used. °· If the port is used for intermittent medicines or blood draws, the port will need to be flushed: °? After medicines have been given. °? After blood has been drawn. °? As part of routine maintenance. °· If a constant infusion is running, the port may not need to be flushed. ° °How long will my port stay implanted? °The port can stay in for as long as your health care provider thinks it is needed. When it is time for the port to come out, surgery will be done to remove it. The procedure is similar to the one performed when the port was put in. °When should I seek immediate medical care? °When you have an implanted port, you should seek immediate medical care if: °· You notice a bad smell coming from the incision site. °· You have swelling, redness, or drainage at the incision site. °· You have more swelling or pain at the port site or the surrounding area. °· You have a fever that is not controlled with  medicine. ° °This information is not intended to replace advice given to you by your health care provider. Make sure you discuss any questions you have with your health care provider. °Document Released: 09/22/2005 Document Revised: 02/28/2016 Document Reviewed: 05/30/2013 °Elsevier Interactive Patient Education © 2017 [employer] ° ° °Implanted Port Insertion, Care After °This sheet gives you information about how to care for yourself after your procedure. Your health care provider may also give you more specific instructions. If you have problems or questions, contact your health care provider. °What can I expect after the procedure? °After your procedure, it is common to have: °· Discomfort at the port insertion site. °· Bruising on the skin over the port. This should improve over 3-4 days. ° °Follow these instructions at home: °Port care °· After your port is placed, you will get a manufacturer's information card. The card has information about your port. Keep this card with   you at all times. °· Take care of the port as told by your health care provider. Ask your health care provider if you or a family member can get training for taking care of the port at home. A home health care nurse may also take care of the port. °· Make sure to remember what type of port you have. °Incision care °· Follow instructions from your health care provider about how to take care of your port insertion site. Make sure you: °? Wash your hands with soap and water before you change your bandage (dressing). If soap and water are not available, use hand sanitizer. °? Change your dressing as told by your health care provider. °? Leave stitches (sutures), skin glue, or adhesive strips in place. These skin closures may need to stay in place for 2 weeks or longer. If adhesive strip edges start to loosen and curl up, you may trim the loose edges. Do not remove adhesive strips completely unless your health care provider tells you to do  that. °· Check your port insertion site every day for signs of infection. Check for: °? More redness, swelling, or pain. °? More fluid or blood. °? Warmth. °? Pus or a bad smell. °General instructions °· Do not take baths, swim, or use a hot tub until your health care provider approves. °· Do not lift anything that is heavier than 10 lb (4.5 kg) for a week, or as told by your health care provider. °· Ask your health care provider when it is okay to: °? Return to work or school. °? Resume usual physical activities or sports. °· Do not drive for 24 hours if you were given a medicine to help you relax (sedative). °· Take over-the-counter and prescription medicines only as told by your health care provider. °· Wear a medical alert bracelet in case of an emergency. This will tell any health care providers that you have a port. °· Keep all follow-up visits as told by your health care provider. This is important. °Contact a health care provider if: °· You cannot flush your port with saline as directed, or you cannot draw blood from the port. °· You have a fever or chills. °· You have more redness, swelling, or pain around your port insertion site. °· You have more fluid or blood coming from your port insertion site. °· Your port insertion site feels warm to the touch. °· You have pus or a bad smell coming from the port insertion site. °Get help right away if: °· You have chest pain or shortness of breath. °· You have bleeding from your port that you cannot control. °Summary °· Take care of the port as told by your health care provider. °· Change your dressing as told by your health care provider. °· Keep all follow-up visits as told by your health care provider. °This information is not intended to replace advice given to you by your health care provider. Make sure you discuss any questions you have with your health care provider. °Document Released: 07/13/2013 Document Revised: 08/13/2016 Document Reviewed:  08/13/2016 °Elsevier Interactive Patient Education © 2017 [employer] ° ° °

## 2018-06-29 NOTE — Procedures (Signed)
Pre Procedure Dx: Poor venous access Post Procedural Dx: Same  Successful placement of right IJ approach port-a-cath with tip at the superior caval atrial junction. The catheter is ready for immediate use.  Estimated Blood Loss: Minimal  Complications: None immediate.  Jay Olive Zmuda, MD Pager #: [phone]   

## 2018-07-07 ENCOUNTER — Inpatient Hospital Stay: Payer: BLUE CROSS/BLUE SHIELD

## 2018-07-07 ENCOUNTER — Other Ambulatory Visit: Payer: Self-pay | Admitting: Hematology and Oncology

## 2018-07-07 ENCOUNTER — Inpatient Hospital Stay (HOSPITAL_BASED_OUTPATIENT_CLINIC_OR_DEPARTMENT_OTHER): Payer: BLUE CROSS/BLUE SHIELD | Admitting: Hematology and Oncology

## 2018-07-07 ENCOUNTER — Inpatient Hospital Stay: Payer: BLUE CROSS/BLUE SHIELD | Attending: Hematology and Oncology

## 2018-07-07 ENCOUNTER — Encounter: Payer: Self-pay | Admitting: Hematology and Oncology

## 2018-07-07 ENCOUNTER — Telehealth: Payer: Self-pay | Admitting: Hematology and Oncology

## 2018-07-07 VITALS — BP 140/78 | HR 57 | Temp 98.0°F | Resp 18 | Ht 67.0 in | Wt 208.0 lb

## 2018-07-07 DIAGNOSIS — C9110 Chronic lymphocytic leukemia of B-cell type not having achieved remission: Secondary | ICD-10-CM | POA: Insufficient documentation

## 2018-07-07 DIAGNOSIS — D61818 Other pancytopenia: Secondary | ICD-10-CM

## 2018-07-07 DIAGNOSIS — R200 Anesthesia of skin: Secondary | ICD-10-CM | POA: Insufficient documentation

## 2018-07-07 DIAGNOSIS — Z23 Encounter for immunization: Secondary | ICD-10-CM | POA: Insufficient documentation

## 2018-07-07 DIAGNOSIS — Z5112 Encounter for antineoplastic immunotherapy: Secondary | ICD-10-CM | POA: Insufficient documentation

## 2018-07-07 DIAGNOSIS — R202 Paresthesia of skin: Secondary | ICD-10-CM | POA: Diagnosis not present

## 2018-07-07 DIAGNOSIS — D61810 Antineoplastic chemotherapy induced pancytopenia: Secondary | ICD-10-CM

## 2018-07-07 DIAGNOSIS — Z95828 Presence of other vascular implants and grafts: Secondary | ICD-10-CM

## 2018-07-07 DIAGNOSIS — I428 Other cardiomyopathies: Secondary | ICD-10-CM

## 2018-07-07 DIAGNOSIS — Z452 Encounter for adjustment and management of vascular access device: Secondary | ICD-10-CM

## 2018-07-07 LAB — CBC WITH DIFFERENTIAL/PLATELET
BASOS PCT: 0 %
Basophils Absolute: 0 10*3/uL (ref 0.0–0.1)
EOS ABS: 0 10*3/uL (ref 0.0–0.5)
Eosinophils Relative: 0 %
HCT: 34.9 % (ref 34.8–46.6)
Hemoglobin: 11.5 g/dL — ABNORMAL LOW (ref 11.6–15.9)
Lymphocytes Relative: 52 %
Lymphs Abs: 1.6 10*3/uL (ref 0.9–3.3)
MCH: 29.1 pg (ref 25.1–34.0)
MCHC: 33 g/dL (ref 31.5–36.0)
MCV: 88.4 fL (ref 79.5–101.0)
MONO ABS: 0.5 10*3/uL (ref 0.1–0.9)
Monocytes Relative: 17 %
Neutro Abs: 1 10*3/uL — ABNORMAL LOW (ref 1.5–6.5)
Neutrophils Relative %: 31 %
PLATELETS: 221 10*3/uL (ref 145–400)
RBC: 3.95 MIL/uL (ref 3.70–5.45)
RDW: 14.7 % — AB (ref 11.2–14.5)
WBC: 3.1 10*3/uL — AB (ref 3.9–10.3)

## 2018-07-07 LAB — COMPREHENSIVE METABOLIC PANEL
ALBUMIN: 4.2 g/dL (ref 3.5–5.0)
ALT: 29 U/L (ref 0–44)
AST: 29 U/L (ref 15–41)
Alkaline Phosphatase: 91 U/L (ref 38–126)
Anion gap: 7 (ref 5–15)
BUN: 10 mg/dL (ref 6–20)
CHLORIDE: 107 mmol/L (ref 98–111)
CO2: 31 mmol/L (ref 22–32)
Calcium: 9.6 mg/dL (ref 8.9–10.3)
Creatinine, Ser: 0.73 mg/dL (ref 0.44–1.00)
GFR calc Af Amer: >60 mL/min (ref >60)
Glucose, Bld: 88 mg/dL (ref 70–99)
POTASSIUM: 3.5 mmol/L (ref 3.5–5.1)
SODIUM: 145 mmol/L (ref 135–145)
Total Bilirubin: 1.1 mg/dL (ref 0.3–1.2)
Total Protein: 6.7 g/dL (ref 6.5–8.1)

## 2018-07-07 LAB — LACTATE DEHYDROGENASE: LDH: 261 U/L — ABNORMAL HIGH (ref 98–192)

## 2018-07-07 MED ORDER — DIPHENHYDRAMINE HCL 25 MG PO CAPS
ORAL_CAPSULE | ORAL | Status: AC
  Filled 2018-07-07: qty 2

## 2018-07-07 MED ORDER — ACETAMINOPHEN 325 MG PO TABS
ORAL_TABLET | ORAL | Status: AC
  Filled 2018-07-07: qty 2

## 2018-07-07 MED ORDER — FAMOTIDINE IN NACL 20-0.9 MG/50ML-% IV SOLN
20.0000 mg | Freq: Once | INTRAVENOUS | Status: AC
  Administered 2018-07-07: 20 mg via INTRAVENOUS

## 2018-07-07 MED ORDER — SODIUM CHLORIDE 0.9% FLUSH
10.0000 mL | INTRAVENOUS | Status: DC | PRN
  Administered 2018-07-07: 10 mL
  Filled 2018-07-07: qty 10

## 2018-07-07 MED ORDER — FAMOTIDINE IN NACL 20-0.9 MG/50ML-% IV SOLN
INTRAVENOUS | Status: AC
  Filled 2018-07-07: qty 50

## 2018-07-07 MED ORDER — HEPARIN SOD (PORK) LOCK FLUSH 100 UNIT/ML IV SOLN
500.0000 [IU] | Freq: Once | INTRAVENOUS | Status: AC | PRN
  Administered 2018-07-07: 500 [IU]
  Filled 2018-07-07: qty 5

## 2018-07-07 MED ORDER — SODIUM CHLORIDE 0.9% FLUSH
10.0000 mL | Freq: Once | INTRAVENOUS | Status: AC
  Administered 2018-07-07: 10 mL
  Filled 2018-07-07: qty 10

## 2018-07-07 MED ORDER — SODIUM CHLORIDE 0.9 % IV SOLN
375.0000 mg/m2 | Freq: Once | INTRAVENOUS | Status: AC
  Administered 2018-07-07: 800 mg via INTRAVENOUS
  Filled 2018-07-07: qty 50

## 2018-07-07 MED ORDER — ACETAMINOPHEN 325 MG PO TABS
650.0000 mg | ORAL_TABLET | Freq: Once | ORAL | Status: AC
  Administered 2018-07-07: 650 mg via ORAL

## 2018-07-07 MED ORDER — DIPHENHYDRAMINE HCL 25 MG PO CAPS
50.0000 mg | ORAL_CAPSULE | Freq: Once | ORAL | Status: AC
  Administered 2018-07-07: 50 mg via ORAL

## 2018-07-07 MED ORDER — SODIUM CHLORIDE 0.9 % IV SOLN
Freq: Once | INTRAVENOUS | Status: AC
  Administered 2018-07-07: 12:00:00 via INTRAVENOUS
  Filled 2018-07-07: qty 250

## 2018-07-07 NOTE — Assessment & Plan Note (Signed)
Recent echocardiogram show resolution of cardiomyopathy She will continue cardiac management

## 2018-07-07 NOTE — Assessment & Plan Note (Signed)
She tolerated treatment well without side effects Last PET CT scan show positive response to therapy I plan to repeat imaging study again in October In the meantime, she will continue on venetoclax and monthly rituximab

## 2018-07-07 NOTE — Patient Instructions (Signed)
[facility] Discharge Instructions for Patients Receiving Chemotherapy  Today you received the following chemotherapy agents: rituximab.  To help prevent nausea and vomiting after your treatment, we encourage you to take your nausea medication as directed.   If you develop nausea and vomiting that is not controlled by your nausea medication, call the clinic.   BELOW ARE SYMPTOMS THAT SHOULD BE REPORTED IMMEDIATELY:  *FEVER GREATER THAN 100.5 F  *CHILLS WITH OR WITHOUT FEVER  NAUSEA AND VOMITING THAT IS NOT CONTROLLED WITH YOUR NAUSEA MEDICATION  *UNUSUAL SHORTNESS OF BREATH  *UNUSUAL BRUISING OR BLEEDING  TENDERNESS IN MOUTH AND THROAT WITH OR WITHOUT PRESENCE OF ULCERS  *URINARY PROBLEMS  *BOWEL PROBLEMS  UNUSUAL RASH Items with * indicate a potential emergency and should be followed up as soon as possible.  Feel free to call the clinic should you have any questions or concerns. The clinic phone number is [phone].  Please show the CHEMO ALERT CARD at check-in to the Emergency Department and triage nurse.   

## 2018-07-07 NOTE — Telephone Encounter (Signed)
Gave patient avs and calendar.   °

## 2018-07-07 NOTE — Assessment & Plan Note (Signed)
She has mild intermittent numbness on her left hand likely due to nerve impingement from her neck I reassured the patient this is not related to recent treatment

## 2018-07-07 NOTE — Assessment & Plan Note (Signed)
She had acquired pancytopenia due to recent treatment but not symptomatic I recommend observation only. We discussed neutropenic precaution I will proceed with treatment today despite mild neutropenia

## 2018-07-07 NOTE — Progress Notes (Signed)
[place] OFFICE PROGRESS NOTE  Patient Care Team: Janith Lima, MD as PCP - General (Internal Medicine) Constance Haw, MD as Consulting Physician (Cardiology)  ASSESSMENT & PLAN:  CLL (chronic lymphocytic leukemia) [facility]) She tolerated treatment well without side effects Last PET CT scan show positive response to therapy I plan to repeat imaging study again in October In the meantime, she will continue on venetoclax and monthly rituximab  Pancytopenia, acquired ([place]) She had acquired pancytopenia due to recent treatment but not symptomatic I recommend observation only. We discussed neutropenic precaution I will proceed with treatment today despite mild neutropenia  Nonischemic cardiomyopathy ([place]) Recent echocardiogram show resolution of cardiomyopathy She will continue cardiac management  Numbness and tingling in left hand She has mild intermittent numbness on her left hand likely due to nerve impingement from her neck I reassured the patient this is not related to recent treatment   Orders Placed This Encounter  Procedures  . NM PET Image Restag (PS) Skull Base To Thigh    Standing Status:   Future    Standing Expiration Date:   07/08/2019    Order Specific Question:   If indicated for the ordered procedure, I authorize the administration of a radiopharmaceutical per Radiology protocol    Answer:   Yes    Order Specific Question:   Preferred imaging location?    Answer:   [facility]    Order Specific Question:   Radiology Contrast Protocol - do NOT remove file path    Answer:   [email or web address]    Order Specific Question:   Is the patient pregnant?    Answer:   No    INTERVAL HISTORY: Please see below for problem oriented charting. She returns with her husband for further follow-up She feels well No new lymphadenopathy Denies recent infection, fever or chills She is able to transition back to work  without significant difficulties No recent cough, chest pain or shortness of breath She complained of intermittent numbness affecting her left hand  SUMMARY OF ONCOLOGIC HISTORY:   CLL (chronic lymphocytic leukemia) ([place])   04/05/2015 Pathology Results    Accession: [id number] flow cytometry confirmed CLL. FISH was positive for p53 mutation    04/24/2015 Imaging    Extensive lymphadenopathy throughout the neck, chest (axilla), abdomen and pelvis, as detailed above, compatible with the reported clinical history of lymphoma. 2. Mild splenomegaly.    05/03/2015 - 08/27/2015 Chemotherapy    She started on Ibrutinib, discontinued prematurely when her prescription ran out    10/10/2015 - 05/29/2017 Chemotherapy    She was restarted back on Ibrutinib    11/13/2016 PET scan    Significant generalized reduction in size of numerous lymph nodes in the neck, chest, abdomen, and pelvis. Previously the activity of these nodes was low-level and in general a similar low-level activity is present today, significantly less than mediastinal blood pool activity, compatible with Deauville 2. 2. Coronary atherosclerosis. Mild cardiomegaly. 3. Mildly prominent endometrium for age without accentuated metabolic activity in the endometrium. Consider pelvic sonography for further characterization.    05/27/2017 PET scan    1. Progressive hypermetabolic adenopathy, primarily involving cervical, axillary, pelvic and inguinal lymph nodes bilaterally, consistent with progressive lymphoma. 2. No solid visceral organ or osseous involvement.    06/16/2017 - 08/25/2017 Chemotherapy    The patient had 3 cycles of Rituximab and Bendamustine    07/15/2017 - 07/19/2017 Hospital Admission    The patient was briefly admitted to the  hospital due to infusion reaction to rituximab    09/18/2017 PET scan    1. Continued considerable adenopathy in the neck, chest, abdomen, and pelvis. This is generally stable in size but mildly reduced  in activity compared to the prior exam. Current levels of activity primarily Deauville 3 and Deauville 4. No splenomegaly. 2. Diffuse new ground-glass opacities in the lungs with associated hypermetabolic activity. Some forms of lymphoma infiltration can rarely cause this pattern of diffuse ground-glass opacity and hypermetabolic activity. Differential diagnostic considerations might include atypical pneumonia such as mycoplasma, acute hypersensitivity pneumonitis, or acute eosinophilic pneumonia. Pulmonary hemorrhage seems less likely to cause this degree of accentuated metabolic activity.  3. Aortic Atherosclerosis (ICD10-I70.0). Coronary atherosclerosis.    09/24/2017 -  Chemotherapy    The patient had ventoclax for chemotherapy treatment.  Rituximab is added on 11/18/17 to 04/09/18, x 6 cycles    11/19/2017 PET scan    Overall mild interval decrease in hypermetabolic lymphadenopathy throughout the neck, chest, abdomen, and pelvis. No new or increased lymphadenopathy identified.  While there has been resolution of diffuse hypermetabolic bilateral ground-glass pulmonary opacity since prior study, there is a new 14 mm hypermetabolic pulmonary nodule in the posterior right lower lobe. Time course favors inflammatory or infectious etiology over neoplasm. Recommend continued follow-up by chest CT in 3 months.    02/16/2018 PET scan    1. Adenopathy in the neck, chest, and pelvis is stable to minimally reduced in size, and is moderately reduced in activity, primarily Deauville 2 disease today. There is a right common iliac lymph node qualifying as Deauville 3 disease which is stable in size but reduced in activity. 2. Stable size but reduced activity in a pulmonary nodule in the right lower lobe. If this represents a leukemic lesion then a corresponds to Deauville 4 disease. This lesion was not readily apparent on 09/22/2017 but was reported on the prior chest CT of 11/19/2017. 3. Other imaging findings of  potential clinical significance: Mild thyroid goiter. Aortic Atherosclerosis (ICD10-I70.0). Coronary atherosclerosis. Mild cardiomegaly.    04/19/2018 PET scan    1. Interval mild mixed metabolic changes, as detailed. Persistent mildly hypermetabolic bilateral axillary, mediastinal and bilateral pelvic adenopathy and mildly hypermetabolic right lower lobe pulmonary nodule compatible with lymphoproliferative disorder. Deauville 4 based on the subcarinal node. 2. Aortic Atherosclerosis (ICD10-I70.0).    06/29/2018 Procedure    Successful placement of a right internal jugular approach power injectable Port-A-Cath. The catheter is ready for immediate use.     REVIEW OF SYSTEMS:   Constitutional: Denies fevers, chills or abnormal weight loss Eyes: Denies blurriness of vision Ears, nose, mouth, throat, and face: Denies mucositis or sore throat Respiratory: Denies cough, dyspnea or wheezes Cardiovascular: Denies palpitation, chest discomfort or lower extremity swelling Gastrointestinal:  Denies nausea, heartburn or change in bowel habits Skin: Denies abnormal skin rashes Lymphatics: Denies new lymphadenopathy or easy bruising Behavioral/Psych: Mood is stable, no new changes  All other systems were reviewed with the patient and are negative.  I have reviewed the past medical history, past surgical history, social history and family history with the patient and they are unchanged from previous note.  ALLERGIES:  has No Known Allergies.  MEDICATIONS:  Current Outpatient Medications  Medication Sig Dispense Refill  . allopurinol (ZYLOPRIM) 300 MG tablet Take 1 tablet (300 mg total) by mouth daily. 60 tablet 1  . apixaban (ELIQUIS) 5 MG TABS tablet Take 1 tablet (5 mg total) by mouth 2 (two) times daily. 180 tablet  3  . Cholecalciferol (VITAMIN D-3) 1000 units CAPS Take 1,000 Units by mouth daily with breakfast.    . furosemide (LASIX) 40 MG tablet TAKE 1 TABLET BY MOUTH EVERY DAY (Patient taking  differently: Take 40 mg by mouth once a day) 90 tablet 3  . lidocaine-prilocaine (EMLA) cream Apply 1 application topically as needed. 30 g 6  . metoprolol succinate (TOPROL-XL) 100 MG 24 hr tablet Take 1 1/2 tablets (150 mg total) daily. Take with or immediately following a meal. 135 tablet 3  . metoprolol succinate (TOPROL-XL) 100 MG 24 hr tablet TAKE 1 TABLET BY MOUTH DAILY WITH OR IMMEDIATELY FOLLOWING A MEAL (TAKE WITH 50 MG TABLET) 90 tablet 1  . potassium chloride SA (K-DUR,KLOR-CON) 20 MEQ tablet Take 1 tablet (20 mEq total) by mouth 2 (two) times daily. 14 tablet 0  . sacubitril-valsartan (ENTRESTO) 24-26 MG Take 1 tablet by mouth 2 (two) times daily. 180 tablet 3  . venetoclax (VENCLEXTA) 100 MG TABS Take 200 mg by mouth daily. 120 tablet 9   No current facility-administered medications for this visit.    Facility-Administered Medications Ordered in Other Visits  Medication Dose Route Frequency Provider Last Rate Last Dose  . sodium chloride flush (NS) 0.9 % injection 3 mL  3 mL Intravenous PRN Alvy Bimler, Kristiana Jacko, MD   10 mL at 04/09/18 1300    PHYSICAL EXAMINATION: ECOG PERFORMANCE STATUS: 1 - Symptomatic but completely ambulatory  Vitals:   07/07/18 1137  BP: 140/78  Pulse: (!) 57  Resp: 18  Temp: 98 F (36.7 C)  SpO2: 100%   Filed Weights   07/07/18 1137  Weight: 208 lb (94.3 kg)    GENERAL:alert, no distress and comfortable SKIN: skin color, texture, turgor are normal, no rashes or significant lesions EYES: normal, Conjunctiva are pink and non-injected, sclera clear OROPHARYNX:no exudate, no erythema and lips, buccal mucosa, and tongue normal  NECK: supple, thyroid normal size, non-tender, without nodularity LYMPH:  no palpable lymphadenopathy in the cervical, axillary or inguinal LUNGS: clear to auscultation and percussion with normal breathing effort HEART: regular rate & rhythm and no murmurs and no lower extremity edema ABDOMEN:abdomen soft, non-tender and normal  bowel sounds Musculoskeletal:no cyanosis of digits and no clubbing  NEURO: alert & oriented x 3 with fluent speech, no focal motor/sensory deficits  LABORATORY DATA:  I have reviewed the data as listed    Component Value Date/Time   NA 142 05/28/2018 0832   NA 140 10/09/2017 1416   K 3.6 05/28/2018 0832   K 4.4 10/09/2017 1416   CL 107 05/28/2018 0832   CL 105 01/18/2013 0912   CO2 27 05/28/2018 0832   CO2 25 10/09/2017 1416   GLUCOSE 99 05/28/2018 0832   GLUCOSE 117 10/09/2017 1416   GLUCOSE 83 01/18/2013 0912   BUN 10 05/28/2018 0832   BUN 11.1 10/09/2017 1416   CREATININE 0.72 05/28/2018 0832   CREATININE 0.8 10/09/2017 1416   CALCIUM 9.7 05/28/2018 0832   CALCIUM 9.6 10/09/2017 1416   PROT 6.5 05/28/2018 0832   PROT 6.4 10/09/2017 1416   ALBUMIN 4.1 05/28/2018 0832   ALBUMIN 3.4 (L) 10/09/2017 1416   AST 19 05/28/2018 0832   AST 18 10/09/2017 1416   ALT 20 05/28/2018 0832   ALT 19 10/09/2017 1416   ALKPHOS 91 05/28/2018 0832   ALKPHOS 69 10/09/2017 1416   BILITOT 0.8 05/28/2018 0832   BILITOT 0.57 10/09/2017 1416   GFRNONAA >60 05/28/2018 0832   GFRAA >60 05/28/2018  5102    No results found for: SPEP, UPEP  Lab Results  Component Value Date   WBC 3.1 (L) 07/07/2018   NEUTROABS 1.0 (L) 07/07/2018   HGB 11.5 (L) 07/07/2018   HCT 34.9 07/07/2018   MCV 88.4 07/07/2018   PLT 221 07/07/2018      Chemistry      Component Value Date/Time   NA 142 05/28/2018 0832   NA 140 10/09/2017 1416   K 3.6 05/28/2018 0832   K 4.4 10/09/2017 1416   CL 107 05/28/2018 0832   CL 105 01/18/2013 0912   CO2 27 05/28/2018 0832   CO2 25 10/09/2017 1416   BUN 10 05/28/2018 0832   BUN 11.1 10/09/2017 1416   CREATININE 0.72 05/28/2018 0832   CREATININE 0.8 10/09/2017 1416      Component Value Date/Time   CALCIUM 9.7 05/28/2018 0832   CALCIUM 9.6 10/09/2017 1416   ALKPHOS 91 05/28/2018 0832   ALKPHOS 69 10/09/2017 1416   AST 19 05/28/2018 0832   AST 18 10/09/2017 1416    ALT 20 05/28/2018 0832   ALT 19 10/09/2017 1416   BILITOT 0.8 05/28/2018 0832   BILITOT 0.57 10/09/2017 1416       RADIOGRAPHIC STUDIES: I have personally reviewed the radiological images as listed and agreed with the findings in the report. Ir Imaging Guided Port Insertion  Result Date: 06/29/2018 INDICATION: History of CLL, in need of durable intravenous access for chemotherapy administration. EXAM: IMPLANTED PORT A CATH PLACEMENT WITH ULTRASOUND AND FLUOROSCOPIC GUIDANCE COMPARISON:  PET-CT - 04/19/2018 MEDICATIONS: Ancef 2 gm IV; The antibiotic was administered within an appropriate time interval prior to skin puncture. ANESTHESIA/SEDATION: Moderate (conscious) sedation was employed during this procedure. A total of Versed 2 mg and Fentanyl 100 mcg was administered intravenously. Moderate Sedation Time: 24 minutes. The patient's level of consciousness and vital signs were monitored continuously by radiology nursing throughout the procedure under my direct supervision. CONTRAST:  None FLUOROSCOPY TIME:  12 seconds (5 mGy) COMPLICATIONS: None immediate. PROCEDURE: The procedure, risks, benefits, and alternatives were explained to the patient. Questions regarding the procedure were encouraged and answered. The patient understands and consents to the procedure. The right neck and chest were prepped with chlorhexidine in a sterile fashion, and a sterile drape was applied covering the operative field. Maximum barrier sterile technique with sterile gowns and gloves were used for the procedure. A timeout was performed prior to the initiation of the procedure. Local anesthesia was provided with 1% lidocaine with epinephrine. After creating a small venotomy incision, a micropuncture kit was utilized to access the internal jugular vein. Real-time ultrasound guidance was utilized for vascular access including the acquisition of a permanent ultrasound image documenting patency of the accessed vessel. The  microwire was utilized to measure appropriate catheter length. A subcutaneous port pocket was then created along the upper chest wall utilizing a combination of sharp and blunt dissection. The pocket was irrigated with sterile saline. A single lumen ISP power injectable port was chosen for placement. The 8 Fr catheter was tunneled from the port pocket site to the venotomy incision. The port was placed in the pocket. The external catheter was trimmed to appropriate length. At the venotomy, an 8 Fr peel-away sheath was placed over a guidewire under fluoroscopic guidance. The catheter was then placed through the sheath and the sheath was removed. Final catheter positioning was confirmed and documented with a fluoroscopic spot radiograph. The port was accessed with a Huber needle, aspirated and  flushed with heparinized saline. The venotomy site was closed with an interrupted 4-0 Vicryl suture. The port pocket incision was closed with interrupted 2-0 Vicryl suture and the skin was opposed with a running subcuticular 4-0 Vicryl suture. Dermabond and Steri-strips were applied to both incisions. Dressings were placed. The patient tolerated the procedure well without immediate post procedural complication. FINDINGS: After catheter placement, the tip lies within the superior cavoatrial junction. The catheter aspirates and flushes normally and is ready for immediate use. IMPRESSION: Successful placement of a right internal jugular approach power injectable Port-A-Cath. The catheter is ready for immediate use. Electronically Signed   By: Sandi Mariscal M.D.   On: 06/29/2018 16:12    All questions were answered. The patient knows to call the clinic with any problems, questions or concerns. No barriers to learning was detected.  I spent 15 minutes counseling the patient face to face. The total time spent in the appointment was 20 minutes and more than 50% was on counseling and review of test results  Heath Lark, MD 07/07/2018  11:46 AM

## 2018-07-13 ENCOUNTER — Telehealth: Payer: Self-pay

## 2018-07-13 NOTE — Telephone Encounter (Signed)
Pt states she has turned in the forms for the pt assistant program and is having trouble getting her Rx Eliquis. Pt states she has reached out to pt assistants and was given a new fax number to send in paperwork ([phone]). Pt will come to the office today to receive two weeks worth samples. Please address. Thank you.

## 2018-07-14 ENCOUNTER — Ambulatory Visit: Payer: BLUE CROSS/BLUE SHIELD | Admitting: Obstetrics and Gynecology

## 2018-07-14 NOTE — Telephone Encounter (Signed)
I have re-faxed the pts [employer] pt asst application to the new fax number.

## 2018-07-26 ENCOUNTER — Ambulatory Visit (HOSPITAL_COMMUNITY)
Admission: RE | Admit: 2018-07-26 | Discharge: 2018-07-26 | Disposition: A | Discharge status: Home / Self Care | Payer: BLUE CROSS/BLUE SHIELD | Source: Ambulatory Visit | Attending: Hematology and Oncology | Admitting: Hematology and Oncology

## 2018-07-26 DIAGNOSIS — C9110 Chronic lymphocytic leukemia of B-cell type not having achieved remission: Secondary | ICD-10-CM | POA: Diagnosis not present

## 2018-07-26 DIAGNOSIS — I700 Atherosclerosis of aorta: Secondary | ICD-10-CM | POA: Diagnosis not present

## 2018-07-26 DIAGNOSIS — R590 Localized enlarged lymph nodes: Secondary | ICD-10-CM | POA: Insufficient documentation

## 2018-07-26 DIAGNOSIS — I2510 Atherosclerotic heart disease of native coronary artery without angina pectoris: Secondary | ICD-10-CM | POA: Insufficient documentation

## 2018-07-26 LAB — GLUCOSE, CAPILLARY: GLUCOSE-CAPILLARY: 106 mg/dL — AB (ref 70–99)

## 2018-07-26 MED ORDER — FLUDEOXYGLUCOSE F - 18 (FDG) INJECTION
10.8400 | Freq: Once | INTRAVENOUS | Status: AC | PRN
  Administered 2018-07-26: 10.84 via INTRAVENOUS

## 2018-07-27 ENCOUNTER — Telehealth: Payer: Self-pay | Admitting: Hematology and Oncology

## 2018-07-27 ENCOUNTER — Inpatient Hospital Stay (HOSPITAL_BASED_OUTPATIENT_CLINIC_OR_DEPARTMENT_OTHER): Payer: BLUE CROSS/BLUE SHIELD | Admitting: Hematology and Oncology

## 2018-07-27 VITALS — BP 132/66 | HR 54 | Temp 97.9°F | Resp 18 | Ht 67.0 in | Wt 206.8 lb

## 2018-07-27 DIAGNOSIS — R202 Paresthesia of skin: Secondary | ICD-10-CM | POA: Diagnosis not present

## 2018-07-27 DIAGNOSIS — I428 Other cardiomyopathies: Secondary | ICD-10-CM

## 2018-07-27 DIAGNOSIS — D61810 Antineoplastic chemotherapy induced pancytopenia: Secondary | ICD-10-CM | POA: Diagnosis not present

## 2018-07-27 DIAGNOSIS — Z5112 Encounter for antineoplastic immunotherapy: Secondary | ICD-10-CM | POA: Diagnosis not present

## 2018-07-27 DIAGNOSIS — D61818 Other pancytopenia: Secondary | ICD-10-CM | POA: Diagnosis not present

## 2018-07-27 DIAGNOSIS — C9110 Chronic lymphocytic leukemia of B-cell type not having achieved remission: Secondary | ICD-10-CM | POA: Diagnosis not present

## 2018-07-27 DIAGNOSIS — R200 Anesthesia of skin: Secondary | ICD-10-CM | POA: Diagnosis not present

## 2018-07-27 DIAGNOSIS — Z23 Encounter for immunization: Secondary | ICD-10-CM | POA: Diagnosis not present

## 2018-07-27 MED ORDER — INFLUENZA VAC SPLIT QUAD 0.5 ML IM SUSY
0.5000 mL | PREFILLED_SYRINGE | Freq: Once | INTRAMUSCULAR | Status: AC
  Administered 2018-07-27: 0.5 mL via INTRAMUSCULAR

## 2018-07-27 NOTE — Telephone Encounter (Signed)
Gave avs and calendar ° °

## 2018-07-28 ENCOUNTER — Encounter: Payer: Self-pay | Admitting: Hematology and Oncology

## 2018-07-28 NOTE — Assessment & Plan Note (Addendum)
She tolerated treatment well without side effects Latest PET CT scan showed positive response to therapy Even though she has persistent lymphadenopathy, overall, the activity is not considered significant In the meantime, she will continue on venetoclax and monthly rituximab I plan to monitor her disease with serial imaging study, her next scan would be around April 2020

## 2018-07-28 NOTE — Progress Notes (Signed)
[facility] OFFICE PROGRESS NOTE  Patient Care Team: Janith Lima, MD as PCP - General (Internal Medicine) Constance Haw, MD as Consulting Physician (Cardiology)  ASSESSMENT & PLAN:  CLL (chronic lymphocytic leukemia) ([place]) She tolerated treatment well without side effects Latest PET CT scan showed positive response to therapy Even though she has persistent lymphadenopathy, overall, the activity is not considered significant In the meantime, she will continue on venetoclax and monthly rituximab I plan to monitor her disease with serial imaging study, her next scan would be around April 2020  Pancytopenia, acquired [facility]) She had acquired pancytopenia due to recent treatment but not symptomatic I recommend observation only. We discussed neutropenic precaution I will proceed with treatment today despite mild neutropenia  We discussed the importance of preventive care and reviewed the vaccination programs. She does not have any prior allergic reactions to influenza vaccination. She agrees to proceed with influenza vaccination today and we will administer it today at the clinic.   Nonischemic cardiomyopathy (HCC) Recent echocardiogram showed resolution of cardiomyopathy She will continue cardiac management   No orders of the defined types were placed in this encounter.   INTERVAL HISTORY: Please see below for problem oriented charting. She returns for further follow-up She denies recent infection, fever or chills No new lymphadenopathy No recent signs or symptoms of congestive heart failure such as chest pain or shortness of breath She is able to tolerate her workload okay  SUMMARY OF ONCOLOGIC HISTORY:   CLL (chronic lymphocytic leukemia) ([place])   04/05/2015 Pathology Results    Accession: [id number] flow cytometry confirmed CLL. FISH was positive for p53 mutation    04/24/2015 Imaging    Extensive lymphadenopathy throughout the neck, chest (axilla),  abdomen and pelvis, as detailed above, compatible with the reported clinical history of lymphoma. 2. Mild splenomegaly.    05/03/2015 - 08/27/2015 Chemotherapy    She started on Ibrutinib, discontinued prematurely when her prescription ran out    10/10/2015 - 05/29/2017 Chemotherapy    She was restarted back on Ibrutinib    11/13/2016 PET scan    Significant generalized reduction in size of numerous lymph nodes in the neck, chest, abdomen, and pelvis. Previously the activity of these nodes was low-level and in general a similar low-level activity is present today, significantly less than mediastinal blood pool activity, compatible with Deauville 2. 2. Coronary atherosclerosis. Mild cardiomegaly. 3. Mildly prominent endometrium for age without accentuated metabolic activity in the endometrium. Consider pelvic sonography for further characterization.    05/27/2017 PET scan    1. Progressive hypermetabolic adenopathy, primarily involving cervical, axillary, pelvic and inguinal lymph nodes bilaterally, consistent with progressive lymphoma. 2. No solid visceral organ or osseous involvement.    06/16/2017 - 08/25/2017 Chemotherapy    The patient had 3 cycles of Rituximab and Bendamustine    07/15/2017 - 07/19/2017 Hospital Admission    The patient was briefly admitted to the hospital due to infusion reaction to rituximab    09/18/2017 PET scan    1. Continued considerable adenopathy in the neck, chest, abdomen, and pelvis. This is generally stable in size but mildly reduced in activity compared to the prior exam. Current levels of activity primarily Deauville 3 and Deauville 4. No splenomegaly. 2. Diffuse new ground-glass opacities in the lungs with associated hypermetabolic activity. Some forms of lymphoma infiltration can rarely cause this pattern of diffuse ground-glass opacity and hypermetabolic activity. Differential diagnostic considerations might include atypical pneumonia such as mycoplasma, acute  hypersensitivity pneumonitis,  or acute eosinophilic pneumonia. Pulmonary hemorrhage seems less likely to cause this degree of accentuated metabolic activity.  3. Aortic Atherosclerosis (ICD10-I70.0). Coronary atherosclerosis.    09/24/2017 -  Chemotherapy    The patient had ventoclax for chemotherapy treatment.  Rituximab is added on 11/18/17 to 04/09/18, x 6 cycles    11/19/2017 PET scan    Overall mild interval decrease in hypermetabolic lymphadenopathy throughout the neck, chest, abdomen, and pelvis. No new or increased lymphadenopathy identified.  While there has been resolution of diffuse hypermetabolic bilateral ground-glass pulmonary opacity since prior study, there is a new 14 mm hypermetabolic pulmonary nodule in the posterior right lower lobe. Time course favors inflammatory or infectious etiology over neoplasm. Recommend continued follow-up by chest CT in 3 months.    02/16/2018 PET scan    1. Adenopathy in the neck, chest, and pelvis is stable to minimally reduced in size, and is moderately reduced in activity, primarily Deauville 2 disease today. There is a right common iliac lymph node qualifying as Deauville 3 disease which is stable in size but reduced in activity. 2. Stable size but reduced activity in a pulmonary nodule in the right lower lobe. If this represents a leukemic lesion then a corresponds to Deauville 4 disease. This lesion was not readily apparent on 09/22/2017 but was reported on the prior chest CT of 11/19/2017. 3. Other imaging findings of potential clinical significance: Mild thyroid goiter. Aortic Atherosclerosis (ICD10-I70.0). Coronary atherosclerosis. Mild cardiomegaly.    04/19/2018 PET scan    1. Interval mild mixed metabolic changes, as detailed. Persistent mildly hypermetabolic bilateral axillary, mediastinal and bilateral pelvic adenopathy and mildly hypermetabolic right lower lobe pulmonary nodule compatible with lymphoproliferative disorder. Deauville 4 based  on the subcarinal node. 2. Aortic Atherosclerosis (ICD10-I70.0).    06/29/2018 Procedure    Successful placement of a right internal jugular approach power injectable Port-A-Cath. The catheter is ready for immediate use.    07/26/2018 PET scan    1. Adenopathy primarily in the chest and pelvis as noted above, with size of the mildly enlarged lymph nodes stable to minimally increased, but with nodal activity generally decreased compared to the prior exam. Uptake in these nodes is primarily Deauville 2. 2. Aortic Atherosclerosis (ICD10-I70.0). Coronary atherosclerosis with mild cardiomegaly.     REVIEW OF SYSTEMS:   Constitutional: Denies fevers, chills or abnormal weight loss Eyes: Denies blurriness of vision Ears, nose, mouth, throat, and face: Denies mucositis or sore throat Respiratory: Denies cough, dyspnea or wheezes Cardiovascular: Denies palpitation, chest discomfort or lower extremity swelling Gastrointestinal:  Denies nausea, heartburn or change in bowel habits Skin: Denies abnormal skin rashes Lymphatics: Denies new lymphadenopathy or easy bruising Neurological:Denies numbness, tingling or new weaknesses Behavioral/Psych: Mood is stable, no new changes  All other systems were reviewed with the patient and are negative.  I have reviewed the past medical history, past surgical history, social history and family history with the patient and they are unchanged from previous note.  ALLERGIES:  has No Known Allergies.  MEDICATIONS:  Current Outpatient Medications  Medication Sig Dispense Refill  . allopurinol (ZYLOPRIM) 300 MG tablet Take 1 tablet (300 mg total) by mouth daily. 60 tablet 1  . apixaban (ELIQUIS) 5 MG TABS tablet Take 1 tablet (5 mg total) by mouth 2 (two) times daily. 180 tablet 3  . Cholecalciferol (VITAMIN D-3) 1000 units CAPS Take 1,000 Units by mouth daily with breakfast.    . furosemide (LASIX) 40 MG tablet TAKE 1 TABLET BY MOUTH EVERY  DAY (Patient taking  differently: Take 40 mg by mouth once a day) 90 tablet 3  . lidocaine-prilocaine (EMLA) cream Apply 1 application topically as needed. 30 g 6  . metoprolol succinate (TOPROL-XL) 100 MG 24 hr tablet Take 1 1/2 tablets (150 mg total) daily. Take with or immediately following a meal. 135 tablet 3  . metoprolol succinate (TOPROL-XL) 100 MG 24 hr tablet TAKE 1 TABLET BY MOUTH DAILY WITH OR IMMEDIATELY FOLLOWING A MEAL (TAKE WITH 50 MG TABLET) 90 tablet 1  . potassium chloride SA (K-DUR,KLOR-CON) 20 MEQ tablet Take 1 tablet (20 mEq total) by mouth 2 (two) times daily. 14 tablet 0  . sacubitril-valsartan (ENTRESTO) 24-26 MG Take 1 tablet by mouth 2 (two) times daily. 180 tablet 3  . venetoclax (VENCLEXTA) 100 MG TABS Take 200 mg by mouth daily. 120 tablet 9   No current facility-administered medications for this visit.    Facility-Administered Medications Ordered in Other Visits  Medication Dose Route Frequency Provider Last Rate Last Dose  . sodium chloride flush (NS) 0.9 % injection 3 mL  3 mL Intravenous PRN Alvy Bimler, Laini Urick, MD   10 mL at 04/09/18 1300    PHYSICAL EXAMINATION: ECOG PERFORMANCE STATUS: 0 - Asymptomatic  Vitals:   07/27/18 1243  BP: 132/66  Pulse: (!) 54  Resp: 18  Temp: 97.9 F (36.6 C)  SpO2: 100%   Filed Weights   07/27/18 1243  Weight: 206 lb 12.8 oz (93.8 kg)    GENERAL:alert, no distress and comfortable SKIN: skin color, texture, turgor are normal, no rashes or significant lesions EYES: normal, Conjunctiva are pink and non-injected, sclera clear OROPHARYNX:no exudate, no erythema and lips, buccal mucosa, and tongue normal  NECK: supple, thyroid normal size, non-tender, without nodularity LYMPH: She has palpable lymphadenopathy in the axilla LUNGS: clear to auscultation and percussion with normal breathing effort HEART: regular rate & rhythm and no murmurs and no lower extremity edema ABDOMEN:abdomen soft, non-tender and normal bowel sounds Musculoskeletal:no  cyanosis of digits and no clubbing  NEURO: alert & oriented x 3 with fluent speech, no focal motor/sensory deficits  LABORATORY DATA:  I have reviewed the data as listed    Component Value Date/Time   NA 145 07/07/2018 1033   NA 140 10/09/2017 1416   K 3.5 07/07/2018 1033   K 4.4 10/09/2017 1416   CL 107 07/07/2018 1033   CL 105 01/18/2013 0912   CO2 31 07/07/2018 1033   CO2 25 10/09/2017 1416   GLUCOSE 88 07/07/2018 1033   GLUCOSE 117 10/09/2017 1416   GLUCOSE 83 01/18/2013 0912   BUN 10 07/07/2018 1033   BUN 11.1 10/09/2017 1416   CREATININE 0.73 07/07/2018 1033   CREATININE 0.8 10/09/2017 1416   CALCIUM 9.6 07/07/2018 1033   CALCIUM 9.6 10/09/2017 1416   PROT 6.7 07/07/2018 1033   PROT 6.4 10/09/2017 1416   ALBUMIN 4.2 07/07/2018 1033   ALBUMIN 3.4 (L) 10/09/2017 1416   AST 29 07/07/2018 1033   AST 18 10/09/2017 1416   ALT 29 07/07/2018 1033   ALT 19 10/09/2017 1416   ALKPHOS 91 07/07/2018 1033   ALKPHOS 69 10/09/2017 1416   BILITOT 1.1 07/07/2018 1033   BILITOT 0.57 10/09/2017 1416   GFRNONAA >60 07/07/2018 1033   GFRAA >60 07/07/2018 1033    No results found for: SPEP, UPEP  Lab Results  Component Value Date   WBC 3.1 (L) 07/07/2018   NEUTROABS 1.0 (L) 07/07/2018   HGB 11.5 (L) 07/07/2018  HCT 34.9 07/07/2018   MCV 88.4 07/07/2018   PLT 221 07/07/2018      Chemistry      Component Value Date/Time   NA 145 07/07/2018 1033   NA 140 10/09/2017 1416   K 3.5 07/07/2018 1033   K 4.4 10/09/2017 1416   CL 107 07/07/2018 1033   CL 105 01/18/2013 0912   CO2 31 07/07/2018 1033   CO2 25 10/09/2017 1416   BUN 10 07/07/2018 1033   BUN 11.1 10/09/2017 1416   CREATININE 0.73 07/07/2018 1033   CREATININE 0.8 10/09/2017 1416      Component Value Date/Time   CALCIUM 9.6 07/07/2018 1033   CALCIUM 9.6 10/09/2017 1416   ALKPHOS 91 07/07/2018 1033   ALKPHOS 69 10/09/2017 1416   AST 29 07/07/2018 1033   AST 18 10/09/2017 1416   ALT 29 07/07/2018 1033   ALT 19  10/09/2017 1416   BILITOT 1.1 07/07/2018 1033   BILITOT 0.57 10/09/2017 1416       RADIOGRAPHIC STUDIES: I have personally reviewed the radiological images as listed and agreed with the findings in the report. Nm Pet Image Restag (ps) Skull Base To Thigh  Result Date: 07/26/2018 CLINICAL DATA:  Subsequent treatment strategy for chronic lymphocytic leukemia. EXAM: NUCLEAR MEDICINE PET SKULL BASE TO THIGH TECHNIQUE: 10.8 mCi F-18 FDG was injected intravenously. Full-ring PET imaging was performed from the skull base to thigh after the radiotracer. CT data was obtained and used for attenuation correction and anatomic localization. Fasting blood glucose: 106 mg/dl COMPARISON:  Multiple exams, including 04/19/2018 FINDINGS: Mediastinal blood pool activity: SUV max 3.1 Background liver activity: SUV max 4.3. NECK: Scattered small lymph nodes in the neck have fairly low-grade activity. A representative right level II lymph node measuring 1.0 cm in short axis on image 25/4 has a maximum SUV of 1.7, previously 2.4. Incidental CT findings: None CHEST: Bilateral axillary, subpectoral, and supraclavicular lymph nodes are observed. The index right axillary node measures 1.3 cm in short axis on image 44/4 (formerly 1.2 cm) with maximum SUV 2.4 (formerly 3.3). An index left axillary node measures 1.1 cm in short axis on image 57/4 (stable) with maximum SUV 1.3 (formerly 1.7). A right eccentric subcarinal node measures 1.0 cm in short axis on image 67/4 with maximum SUV 3.1, previously 1.0 cm in size with maximum SUV of 4.1. A right lower lobe nodule measuring 1.5 by 1.1 cm on image 40/8 is stable by my measurements and has a maximum SUV of 2.7 (formerly 3.2). Incidental CT findings: Mild cardiomegaly. Coronary atherosclerosis. 4 mm left upper lobe pulmonary nodule on image 21/8. Port-A-Cath tip: Cavoatrial junction. ABDOMEN/PELVIS: Common iliac, external iliac, and operator adenopathy again noted bilaterally. The index  right common iliac node measures 1.2 cm in short axis on image 141/4 (formerly 1.1 cm) with maximum SUV 2.4 (formerly 2.8). The index right external iliac/pelvic sidewall node on image 159/4 measures 1.4 cm in short axis (formerly 1.2 cm) with maximum SUV 2.4 (formerly 3.0). Incidental CT findings: Aortoiliac atherosclerotic vascular disease. SKELETON: No significant abnormal hypermetabolic activity in this region. Incidental CT findings: none IMPRESSION: 1. Adenopathy primarily in the chest and pelvis as noted above, with size of the mildly enlarged lymph nodes stable to minimally increased, but with nodal activity generally decreased compared to the prior exam. Uptake in these nodes is primarily Deauville 2. 2. Aortic Atherosclerosis (ICD10-I70.0). Coronary atherosclerosis with mild cardiomegaly. ------------------- Deauville Scale: 1. No uptake or no residual uptake (when used interim) 2.  Slight uptake, but below blood pool (mediastinum) 3. Uptake above mediastinal, but below or equal to uptake in the liver 4. Uptake slightly to moderately higher than liver 5. Markedly increased uptake or any new lesion (on response evaluation) Electronically Signed   By: Van Clines M.D.   On: 07/26/2018 08:51   [facility] Guided Port Insertion  Result Date: 06/29/2018 INDICATION: History of CLL, in need of durable intravenous access for chemotherapy administration. EXAM: IMPLANTED PORT A CATH PLACEMENT WITH ULTRASOUND AND FLUOROSCOPIC GUIDANCE COMPARISON:  PET-CT - 04/19/2018 MEDICATIONS: Ancef 2 gm IV; The antibiotic was administered within an appropriate time interval prior to skin puncture. ANESTHESIA/SEDATION: Moderate (conscious) sedation was employed during this procedure. A total of Versed 2 mg and Fentanyl 100 mcg was administered intravenously. Moderate Sedation Time: 24 minutes. The patient's level of consciousness and vital signs were monitored continuously by radiology nursing throughout the procedure  under my direct supervision. CONTRAST:  None FLUOROSCOPY TIME:  12 seconds (5 mGy) COMPLICATIONS: None immediate. PROCEDURE: The procedure, risks, benefits, and alternatives were explained to the patient. Questions regarding the procedure were encouraged and answered. The patient understands and consents to the procedure. The right neck and chest were prepped with chlorhexidine in a sterile fashion, and a sterile drape was applied covering the operative field. Maximum barrier sterile technique with sterile gowns and gloves were used for the procedure. A timeout was performed prior to the initiation of the procedure. Local anesthesia was provided with 1% lidocaine with epinephrine. After creating a small venotomy incision, a micropuncture kit was utilized to access the internal jugular vein. Real-time ultrasound guidance was utilized for vascular access including the acquisition of a permanent ultrasound image documenting patency of the accessed vessel. The microwire was utilized to measure appropriate catheter length. A subcutaneous port pocket was then created along the upper chest wall utilizing a combination of sharp and blunt dissection. The pocket was irrigated with sterile saline. A single lumen ISP power injectable port was chosen for placement. The 8 Fr catheter was tunneled from the port pocket site to the venotomy incision. The port was placed in the pocket. The external catheter was trimmed to appropriate length. At the venotomy, an 8 Fr peel-away sheath was placed over a guidewire under fluoroscopic guidance. The catheter was then placed through the sheath and the sheath was removed. Final catheter positioning was confirmed and documented with a fluoroscopic spot radiograph. The port was accessed with a Huber needle, aspirated and flushed with heparinized saline. The venotomy site was closed with an interrupted 4-0 Vicryl suture. The port pocket incision was closed with interrupted 2-0 Vicryl suture and  the skin was opposed with a running subcuticular 4-0 Vicryl suture. Dermabond and Steri-strips were applied to both incisions. Dressings were placed. The patient tolerated the procedure well without immediate post procedural complication. FINDINGS: After catheter placement, the tip lies within the superior cavoatrial junction. The catheter aspirates and flushes normally and is ready for immediate use. IMPRESSION: Successful placement of a right internal jugular approach power injectable Port-A-Cath. The catheter is ready for immediate use. Electronically Signed   By: Sandi Mariscal M.D.   On: 06/29/2018 16:12    All questions were answered. The patient knows to call the clinic with any problems, questions or concerns. No barriers to learning was detected.  I spent 15 minutes counseling the patient face to face. The total time spent in the appointment was 20 minutes and more than 50% was on counseling and review of  test results  Heath Lark, MD 07/28/2018 6:43 AM

## 2018-07-28 NOTE — Assessment & Plan Note (Addendum)
She had acquired pancytopenia due to recent treatment but not symptomatic I recommend observation only. We discussed neutropenic precaution I will proceed with treatment today despite mild neutropenia  We discussed the importance of preventive care and reviewed the vaccination programs. She does not have any prior allergic reactions to influenza vaccination. She agrees to proceed with influenza vaccination today and we will administer it today at the clinic.

## 2018-07-28 NOTE — Assessment & Plan Note (Signed)
Recent echocardiogram showed resolution of cardiomyopathy She will continue cardiac management 

## 2018-08-09 ENCOUNTER — Inpatient Hospital Stay: Payer: BLUE CROSS/BLUE SHIELD | Attending: Hematology and Oncology

## 2018-08-09 ENCOUNTER — Other Ambulatory Visit: Payer: Self-pay | Admitting: *Deleted

## 2018-08-09 ENCOUNTER — Encounter: Payer: Self-pay | Admitting: *Deleted

## 2018-08-09 ENCOUNTER — Inpatient Hospital Stay: Payer: BLUE CROSS/BLUE SHIELD

## 2018-08-09 ENCOUNTER — Telehealth: Payer: Self-pay | Admitting: Hematology and Oncology

## 2018-08-09 VITALS — BP 144/82 | HR 51 | Temp 98.0°F | Resp 18 | Ht 67.0 in | Wt 205.8 lb

## 2018-08-09 DIAGNOSIS — Z9221 Personal history of antineoplastic chemotherapy: Secondary | ICD-10-CM | POA: Diagnosis not present

## 2018-08-09 DIAGNOSIS — R591 Generalized enlarged lymph nodes: Secondary | ICD-10-CM | POA: Insufficient documentation

## 2018-08-09 DIAGNOSIS — C9110 Chronic lymphocytic leukemia of B-cell type not having achieved remission: Secondary | ICD-10-CM

## 2018-08-09 DIAGNOSIS — Z79899 Other long term (current) drug therapy: Secondary | ICD-10-CM | POA: Diagnosis not present

## 2018-08-09 DIAGNOSIS — E876 Hypokalemia: Secondary | ICD-10-CM

## 2018-08-09 DIAGNOSIS — Z5112 Encounter for antineoplastic immunotherapy: Secondary | ICD-10-CM | POA: Diagnosis not present

## 2018-08-09 LAB — COMPREHENSIVE METABOLIC PANEL
ALT: 23 U/L (ref 0–44)
ANION GAP: 8 (ref 5–15)
AST: 29 U/L (ref 15–41)
Albumin: 3.9 g/dL (ref 3.5–5.0)
Alkaline Phosphatase: 90 U/L (ref 38–126)
BUN: 9 mg/dL (ref 6–20)
CHLORIDE: 109 mmol/L (ref 98–111)
CO2: 28 mmol/L (ref 22–32)
Calcium: 9.3 mg/dL (ref 8.9–10.3)
Creatinine, Ser: 0.71 mg/dL (ref 0.44–1.00)
Glucose, Bld: 100 mg/dL — ABNORMAL HIGH (ref 70–99)
POTASSIUM: 2.8 mmol/L — AB (ref 3.5–5.1)
Sodium: 145 mmol/L (ref 135–145)
TOTAL PROTEIN: 6.3 g/dL — AB (ref 6.5–8.1)
Total Bilirubin: 1.3 mg/dL — ABNORMAL HIGH (ref 0.3–1.2)

## 2018-08-09 LAB — CBC WITH DIFFERENTIAL/PLATELET
Abs Immature Granulocytes: 0.03 10*3/uL (ref 0.00–0.07)
BASOS ABS: 0 10*3/uL (ref 0.0–0.1)
BASOS PCT: 0 %
EOS ABS: 0 10*3/uL (ref 0.0–0.5)
EOS PCT: 0 %
HCT: 34.3 % — ABNORMAL LOW (ref 36.0–46.0)
Hemoglobin: 11.1 g/dL — ABNORMAL LOW (ref 12.0–15.0)
Immature Granulocytes: 1 %
LYMPHS ABS: 1.4 10*3/uL (ref 0.7–4.0)
Lymphocytes Relative: 51 %
MCH: 28.9 pg (ref 26.0–34.0)
MCHC: 32.4 g/dL (ref 30.0–36.0)
MCV: 89.3 fL (ref 80.0–100.0)
Monocytes Absolute: 0.4 10*3/uL (ref 0.1–1.0)
Monocytes Relative: 14 %
NEUTROS PCT: 34 %
NRBC: 0 % (ref 0.0–0.2)
Neutro Abs: 0.9 10*3/uL — ABNORMAL LOW (ref 1.7–7.7)
PLATELETS: 211 10*3/uL (ref 150–400)
RBC: 3.84 MIL/uL — ABNORMAL LOW (ref 3.87–5.11)
RDW: 13.5 % (ref 11.5–15.5)
WBC: 2.7 10*3/uL — ABNORMAL LOW (ref 4.0–10.5)

## 2018-08-09 LAB — LACTATE DEHYDROGENASE: LDH: 270 U/L — AB (ref 98–192)

## 2018-08-09 MED ORDER — POTASSIUM CHLORIDE CRYS ER 20 MEQ PO TBCR
EXTENDED_RELEASE_TABLET | ORAL | Status: AC
  Filled 2018-08-09: qty 2

## 2018-08-09 MED ORDER — POTASSIUM CHLORIDE CRYS ER 20 MEQ PO TBCR
40.0000 meq | EXTENDED_RELEASE_TABLET | Freq: Once | ORAL | Status: AC
  Administered 2018-08-09: 40 meq via ORAL

## 2018-08-09 MED ORDER — FAMOTIDINE IN NACL 20-0.9 MG/50ML-% IV SOLN: 20.0000 mg | Freq: Once | INTRAVENOUS | Status: DC

## 2018-08-09 MED ORDER — HEPARIN SOD (PORK) LOCK FLUSH 100 UNIT/ML IV SOLN
500.0000 [IU] | Freq: Once | INTRAVENOUS | Status: AC | PRN
  Administered 2018-08-09: 500 [IU]
  Filled 2018-08-09: qty 5

## 2018-08-09 MED ORDER — SODIUM CHLORIDE 0.9% FLUSH
10.0000 mL | INTRAVENOUS | Status: DC | PRN
  Administered 2018-08-09: 10 mL
  Filled 2018-08-09: qty 10

## 2018-08-09 MED ORDER — SODIUM CHLORIDE 0.9 % IV SOLN
20.0000 mg | Freq: Once | INTRAVENOUS | Status: AC
  Administered 2018-08-09: 20 mg via INTRAVENOUS
  Filled 2018-08-09: qty 2

## 2018-08-09 MED ORDER — POTASSIUM CHLORIDE CRYS ER 20 MEQ PO TBCR: 40.0000 meq | EXTENDED_RELEASE_TABLET | ORAL | 0 refills | Status: DC

## 2018-08-09 MED ORDER — DIPHENHYDRAMINE HCL 25 MG PO CAPS: 50.0000 mg | ORAL_CAPSULE | Freq: Once | ORAL | Status: DC

## 2018-08-09 MED ORDER — SODIUM CHLORIDE 0.9 % IV SOLN
Freq: Once | INTRAVENOUS | Status: AC
  Administered 2018-08-09: 13:00:00 via INTRAVENOUS
  Filled 2018-08-09: qty 250

## 2018-08-09 MED ORDER — ACETAMINOPHEN 325 MG PO TABS: 650.0000 mg | ORAL_TABLET | Freq: Once | ORAL | Status: DC

## 2018-08-09 NOTE — Patient Instructions (Signed)
Implanted Port Home Guide An implanted port is a type of central line that is placed under the skin. Central lines are used to provide IV access when treatment or nutrition needs to be given through a person's veins. Implanted ports are used for long-term IV access. An implanted port may be placed because:  You need IV medicine that would be irritating to the small veins in your hands or arms.  You need long-term IV medicines, such as antibiotics.  You need IV nutrition for a long period.  You need frequent blood draws for lab tests.  You need dialysis.  Implanted ports are usually placed in the chest area, but they can also be placed in the upper arm, the abdomen, or the leg. An implanted port has two main parts:  Reservoir. The reservoir is round and will appear as a small, raised area under your skin. The reservoir is the part where a needle is inserted to give medicines or draw blood.  Catheter. The catheter is a thin, flexible tube that extends from the reservoir. The catheter is placed into a large vein. Medicine that is inserted into the reservoir goes into the catheter and then into the vein.  How will I care for my incision site? Do not get the incision site wet. Bathe or shower as directed by your health care provider. How is my port accessed? Special steps must be taken to access the port:  Before the port is accessed, a numbing cream can be placed on the skin. This helps numb the skin over the port site.  Your health care provider uses a sterile technique to access the port. ? Your health care provider must put on a mask and sterile gloves. ? The skin over your port is cleaned carefully with an antiseptic and allowed to dry. ? The port is gently pinched between sterile gloves, and a needle is inserted into the port.  Only "non-coring" port needles should be used to access the port. Once the port is accessed, a blood return should be checked. This helps ensure that the port  is in the vein and is not clogged.  If your port needs to remain accessed for a constant infusion, a clear (transparent) bandage will be placed over the needle site. The bandage and needle will need to be changed every week, or as directed by your health care provider.  Keep the bandage covering the needle clean and dry. Do not get it wet. Follow your health care provider's instructions on how to take a shower or bath while the port is accessed.  If your port does not need to stay accessed, no bandage is needed over the port.  What is flushing? Flushing helps keep the port from getting clogged. Follow your health care provider's instructions on how and when to flush the port. Ports are usually flushed with saline solution or a medicine called heparin. The need for flushing will depend on how the port is used.  If the port is used for intermittent medicines or blood draws, the port will need to be flushed: ? After medicines have been given. ? After blood has been drawn. ? As part of routine maintenance.  If a constant infusion is running, the port may not need to be flushed.  How long will my port stay implanted? The port can stay in for as long as your health care provider thinks it is needed. When it is time for the port to come out, surgery will be   done to remove it. The procedure is similar to the one performed when the port was put in. When should I seek immediate medical care? When you have an implanted port, you should seek immediate medical care if:  You notice a bad smell coming from the incision site.  You have swelling, redness, or drainage at the incision site.  You have more swelling or pain at the port site or the surrounding area.  You have a fever that is not controlled with medicine.  This information is not intended to replace advice given to you by your health care provider. Make sure you discuss any questions you have with your health care provider. Document  Released: 09/22/2005 Document Revised: 02/28/2016 Document Reviewed: 05/30/2013 [employer] Interactive Patient Education  2017 [employer]  

## 2018-08-09 NOTE — Patient Instructions (Signed)

## 2018-08-09 NOTE — Telephone Encounter (Signed)
Scheduled appt per 11/4 sch message - Rn Thu to give patient updated schedule.

## 2018-08-09 NOTE — Progress Notes (Signed)
Per Dr. Audelia Hives,  HOLD Rituxan today.  Give Kdur 40 meq po x 1 now.  Pt to have lab and visit with Dr. Audelia Hives on 08/16/18 and possible chemo.  Explanations given to pt and spouse today. Both voiced understanding.

## 2018-08-09 NOTE — Telephone Encounter (Signed)
Letter received via fax from [place] stating that they have approved the pt for pt asst with Eliquis. Approval good from 08/05/2018 until 08/06/2019.  Application Case#: [id number]

## 2018-08-15 ENCOUNTER — Other Ambulatory Visit: Payer: Self-pay | Admitting: Hematology and Oncology

## 2018-08-16 ENCOUNTER — Encounter: Payer: Self-pay | Admitting: Hematology and Oncology

## 2018-08-16 ENCOUNTER — Inpatient Hospital Stay (HOSPITAL_BASED_OUTPATIENT_CLINIC_OR_DEPARTMENT_OTHER): Payer: BLUE CROSS/BLUE SHIELD | Admitting: Hematology and Oncology

## 2018-08-16 ENCOUNTER — Inpatient Hospital Stay: Payer: BLUE CROSS/BLUE SHIELD

## 2018-08-16 ENCOUNTER — Telehealth: Payer: Self-pay

## 2018-08-16 VITALS — BP 161/78 | HR 50 | Temp 97.7°F | Resp 18 | Ht 67.0 in | Wt 205.6 lb

## 2018-08-16 VITALS — BP 126/70 | HR 56 | Temp 97.8°F | Resp 16

## 2018-08-16 DIAGNOSIS — Z79899 Other long term (current) drug therapy: Secondary | ICD-10-CM

## 2018-08-16 DIAGNOSIS — C9110 Chronic lymphocytic leukemia of B-cell type not having achieved remission: Secondary | ICD-10-CM

## 2018-08-16 DIAGNOSIS — R591 Generalized enlarged lymph nodes: Secondary | ICD-10-CM | POA: Diagnosis not present

## 2018-08-16 DIAGNOSIS — Z5111 Encounter for antineoplastic chemotherapy: Secondary | ICD-10-CM

## 2018-08-16 DIAGNOSIS — Z9221 Personal history of antineoplastic chemotherapy: Secondary | ICD-10-CM

## 2018-08-16 DIAGNOSIS — Z5112 Encounter for antineoplastic immunotherapy: Secondary | ICD-10-CM | POA: Diagnosis not present

## 2018-08-16 LAB — CBC WITH DIFFERENTIAL (CANCER CENTER ONLY)
ABS IMMATURE GRANULOCYTES: 0.01 10*3/uL (ref 0.00–0.07)
Basophils Absolute: 0 10*3/uL (ref 0.0–0.1)
Basophils Relative: 0 %
Eosinophils Absolute: 0 10*3/uL (ref 0.0–0.5)
Eosinophils Relative: 0 %
HCT: 39.6 % (ref 36.0–46.0)
HEMOGLOBIN: 12.9 g/dL (ref 12.0–15.0)
Immature Granulocytes: 0 %
LYMPHS ABS: 1.3 10*3/uL (ref 0.7–4.0)
LYMPHS PCT: 47 %
MCH: 29 pg (ref 26.0–34.0)
MCHC: 32.6 g/dL (ref 30.0–36.0)
MCV: 89 fL (ref 80.0–100.0)
MONO ABS: 0.6 10*3/uL (ref 0.1–1.0)
MONOS PCT: 21 %
NEUTROS ABS: 0.9 10*3/uL — AB (ref 1.7–7.7)
Neutrophils Relative %: 32 %
Platelet Count: 222 10*3/uL (ref 150–400)
RBC: 4.45 MIL/uL (ref 3.87–5.11)
RDW: 13.4 % (ref 11.5–15.5)
WBC Count: 2.9 10*3/uL — ABNORMAL LOW (ref 4.0–10.5)
nRBC: 0 % (ref 0.0–0.2)

## 2018-08-16 LAB — CMP (CANCER CENTER ONLY)
ALBUMIN: 4.3 g/dL (ref 3.5–5.0)
ALT: 21 U/L (ref 0–44)
AST: 21 U/L (ref 15–41)
Alkaline Phosphatase: 97 U/L (ref 38–126)
Anion gap: 10 (ref 5–15)
BILIRUBIN TOTAL: 0.9 mg/dL (ref 0.3–1.2)
BUN: 10 mg/dL (ref 6–20)
CHLORIDE: 107 mmol/L (ref 98–111)
CO2: 27 mmol/L (ref 22–32)
Calcium: 9.8 mg/dL (ref 8.9–10.3)
Creatinine: 0.77 mg/dL (ref 0.44–1.00)
GFR, Est AFR Am: >60 mL/min (ref >60)
GFR, Estimated: >60 mL/min (ref >60)
GLUCOSE: 57 mg/dL — AB (ref 70–99)
POTASSIUM: 4.3 mmol/L (ref 3.5–5.1)
Sodium: 144 mmol/L (ref 135–145)
TOTAL PROTEIN: 7.1 g/dL (ref 6.5–8.1)

## 2018-08-16 MED ORDER — ACETAMINOPHEN 325 MG PO TABS
ORAL_TABLET | ORAL | Status: AC
  Filled 2018-08-16: qty 2

## 2018-08-16 MED ORDER — HEPARIN SOD (PORK) LOCK FLUSH 100 UNIT/ML IV SOLN
500.0000 [IU] | Freq: Once | INTRAVENOUS | Status: AC | PRN
  Administered 2018-08-16: 500 [IU]
  Filled 2018-08-16: qty 5

## 2018-08-16 MED ORDER — SODIUM CHLORIDE 0.9 % IV SOLN
20.0000 mg | Freq: Once | INTRAVENOUS | Status: AC
  Administered 2018-08-16: 20 mg via INTRAVENOUS
  Filled 2018-08-16: qty 2

## 2018-08-16 MED ORDER — SODIUM CHLORIDE 0.9% FLUSH
10.0000 mL | INTRAVENOUS | Status: DC | PRN
  Administered 2018-08-16: 10 mL
  Filled 2018-08-16: qty 10

## 2018-08-16 MED ORDER — FAMOTIDINE IN NACL 20-0.9 MG/50ML-% IV SOLN: 20.0000 mg | Freq: Once | INTRAVENOUS | Status: DC

## 2018-08-16 MED ORDER — DIPHENHYDRAMINE HCL 25 MG PO CAPS
50.0000 mg | ORAL_CAPSULE | Freq: Once | ORAL | Status: AC
  Administered 2018-08-16: 50 mg via ORAL

## 2018-08-16 MED ORDER — SODIUM CHLORIDE 0.9 % IV SOLN
Freq: Once | INTRAVENOUS | Status: AC
  Administered 2018-08-16: 13:00:00 via INTRAVENOUS
  Filled 2018-08-16: qty 250

## 2018-08-16 MED ORDER — SODIUM CHLORIDE 0.9 % IV SOLN
375.0000 mg/m2 | Freq: Once | INTRAVENOUS | Status: AC
  Administered 2018-08-16: 800 mg via INTRAVENOUS
  Filled 2018-08-16: qty 30

## 2018-08-16 MED ORDER — ACETAMINOPHEN 325 MG PO TABS
650.0000 mg | ORAL_TABLET | Freq: Once | ORAL | Status: AC
  Administered 2018-08-16: 650 mg via ORAL

## 2018-08-16 MED ORDER — DIPHENHYDRAMINE HCL 25 MG PO CAPS
ORAL_CAPSULE | ORAL | Status: AC
  Filled 2018-08-16: qty 2

## 2018-08-16 NOTE — Patient Instructions (Signed)

## 2018-08-16 NOTE — Progress Notes (Signed)
Hematology/Oncology Outpatient Progress Note  Patient Care Team: Janith Lima, MD as PCP - General (Internal Medicine) Constance Haw, MD as Consulting Physician (Cardiology)  ASSESSMENT & PLAN:  Overall she is tolerated treatment well. Last week treatment was held due to protracted nausea and diarrhea. Her potassium was 2.9. She was replaced with aggressive supplemental potassium. The results of her laboratory studies from today were reviewed and discussed in detail. She was given copies of her lab work. Although she has persistent lymphadenopathy, the overall metabolic activity is not considered significant. She continues on daily venetoclax She presents today in anticipation of single agent rituximab. Because she is on a daily diuretic, it was recommended that she continue potassium (KCl): 20 mEq once daily in the morning with food. It was recommended that she take ondansetron as needed, for nausea likely associated with venetoclax. Barring any unforeseen complications, her next scheduled doctor visit with laboratory studies is on December 9 in anticipation of day 1, cycle 11 of rituximab. She was advised to call us in the interim should any new or untoward problems arise. Her next reassessing PET/CT scan is anticipated in April, 2020.  INTERVAL HISTORY: Please see below for problem oriented charting. Her appetite has improved. She still has mild nausea and vomits once weekly. She does not take her antiemetics. For the past year, she has had loose stool numbering 2-3 BMs daily. She denies recent infection, fever or chills No new lymphadenopathy No recent signs or symptoms of congestive heart failure or exertional dyspnea. 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  SUMMARY OF ONCOLOGIC HISTORY:   CLL (chronic lymphocytic leukemia) ([place] [facility])   04/05/2015 Pathology Results    Accession: [id number] flow cytometry confirmed CLL. FISH was positive for p53 mutation    04/24/2015  Imaging    Extensive lymphadenopathy throughout the neck, chest (axilla), abdomen and pelvis, as detailed above, compatible with the reported clinical history of lymphoma. 2. Mild splenomegaly.    05/03/2015 - 08/27/2015 Chemotherapy    She started on Ibrutinib, discontinued prematurely when her prescription ran out    10/10/2015 - 05/29/2017 Chemotherapy    She was restarted back on Ibrutinib    11/13/2016 PET scan    Significant generalized reduction in size of numerous lymph nodes in the neck, chest, abdomen, and pelvis. Previously the activity of these nodes was low-level and in general a similar low-level activity is present today, significantly less than mediastinal blood pool activity, compatible with Deauville 2. 2. Coronary atherosclerosis. Mild cardiomegaly. 3. Mildly prominent endometrium for age without accentuated metabolic activity in the endometrium. Consider pelvic sonography for further characterization.    05/27/2017 PET scan    1. Progressive hypermetabolic adenopathy, primarily involving cervical, axillary, pelvic and inguinal lymph nodes bilaterally, consistent with progressive lymphoma. 2. No solid visceral organ or osseous involvement.    06/16/2017 - 08/25/2017 Chemotherapy    The patient had 3 cycles of Rituximab and Bendamustine    07/15/2017 - 07/19/2017 Hospital Admission    The patient was briefly admitted to the hospital due to infusion reaction to rituximab    09/18/2017 PET scan    1. Continued considerable adenopathy in the neck, chest, abdomen, and pelvis. This is generally stable in size but mildly reduced in activity compared to the prior exam. Current levels of activity primarily Deauville 3 and Deauville 4. No splenomegaly. 2. Diffuse new ground-glass opacities in the lungs with associated hypermetabolic activity. Some forms of lymphoma infiltration can rarely cause this pattern of diffuse ground-glass  opacity and hypermetabolic activity. Differential  diagnostic considerations might include atypical pneumonia such as mycoplasma, acute hypersensitivity pneumonitis, or acute eosinophilic pneumonia. Pulmonary hemorrhage seems less likely to cause this degree of accentuated metabolic activity.  3. Aortic Atherosclerosis (ICD10-I70.0). Coronary atherosclerosis.    09/24/2017 -  Chemotherapy    The patient had ventoclax for chemotherapy treatment.  Rituximab is added on 11/18/17 to 04/09/18, x 6 cycles    11/19/2017 PET scan    Overall mild interval decrease in hypermetabolic lymphadenopathy throughout the neck, chest, abdomen, and pelvis. No new or increased lymphadenopathy identified.  While there has been resolution of diffuse hypermetabolic bilateral ground-glass pulmonary opacity since prior study, there is a new 14 mm hypermetabolic pulmonary nodule in the posterior right lower lobe. Time course favors inflammatory or infectious etiology over neoplasm. Recommend continued follow-up by chest CT in 3 months.    02/16/2018 PET scan    1. Adenopathy in the neck, chest, and pelvis is stable to minimally reduced in size, and is moderately reduced in activity, primarily Deauville 2 disease today. There is a right common iliac lymph node qualifying as Deauville 3 disease which is stable in size but reduced in activity. 2. Stable size but reduced activity in a pulmonary nodule in the right lower lobe. If this represents a leukemic lesion then a corresponds to Deauville 4 disease. This lesion was not readily apparent on 09/22/2017 but was reported on the prior chest CT of 11/19/2017. 3. Other imaging findings of potential clinical significance: Mild thyroid goiter. Aortic Atherosclerosis (ICD10-I70.0). Coronary atherosclerosis. Mild cardiomegaly.    04/19/2018 PET scan    1. Interval mild mixed metabolic changes, as detailed. Persistent mildly hypermetabolic bilateral axillary, mediastinal and bilateral pelvic adenopathy and mildly hypermetabolic right lower  lobe pulmonary nodule compatible with lymphoproliferative disorder. Deauville 4 based on the subcarinal node. 2. Aortic Atherosclerosis (ICD10-I70.0).    06/29/2018 Procedure    Successful placement of a right internal jugular approach power injectable Port-A-Cath. The catheter is ready for immediate use.    07/26/2018 PET scan    1. Adenopathy primarily in the chest and pelvis as noted above, with size of the mildly enlarged lymph nodes stable to minimally increased, but with nodal activity generally decreased compared to the prior exam. Uptake in these nodes is primarily Deauville 2. 2. Aortic Atherosclerosis (ICD10-I70.0). Coronary atherosclerosis with mild cardiomegaly.      08/16/2018                   Day 1, cycle 10 rituximab 375 mg/m  REVIEW OF SYSTEMS:   Constitutional: Denies fevers, chills or abnormal weight loss Eyes: Denies blurriness of vision Ears, nose, mouth, throat, and face: Denies mucositis or sore throat Respiratory: Denies cough, dyspnea or wheezes Cardiovascular: Denies palpitation, chest discomfort or lower extremity swelling Gastrointestinal: Mild nausea with episodic vomiting; no heartburn or change in bowel habits Skin: Denies abnormal skin rashes Lymphatics: Denies new lymphadenopathy or easy bruising Neurological:Denies numbness, tingling or new weaknesses Behavioral/Psych: Mood is stable, no new changes  All other systems were reviewed with the patient and are negative.  Past Medical History Reviewed        Family History Reviewed       Social History Reviewed  ALLERGIES:  has No Known Allergies.  MEDICATIONS:  Current Outpatient Medications  Medication Sig Dispense Refill  . apixaban (ELIQUIS) 5 MG TABS tablet Take 1 tablet (5 mg total) by mouth 2 (two) times daily. 180 tablet 3  . Cholecalciferol (  VITAMIN D-3) 1000 units CAPS Take 1,000 Units by mouth daily with breakfast.    . furosemide (LASIX) 40 MG tablet TAKE 1 TABLET BY MOUTH EVERY DAY (Patient  taking differently: Take 40 mg by mouth once a day) 90 tablet 3  . lidocaine-prilocaine (EMLA) cream Apply 1 application topically as needed. 30 g 6  . metoprolol succinate (TOPROL-XL) 100 MG 24 hr tablet Take 1 1/2 tablets (150 mg total) daily. Take with or immediately following a meal. 135 tablet 3  . potassium chloride SA (K-DUR,KLOR-CON) 20 MEQ tablet Take 2 tablets (40 mEq total) by mouth as directed. Take Kdur 40 meq  Twice daily for 3 days , then 40 meq daily until further notice. 60 tablet 0  . sacubitril-valsartan (ENTRESTO) 24-26 MG Take 1 tablet by mouth 2 (two) times daily. 180 tablet 3  . venetoclax (VENCLEXTA) 100 MG TABS Take 200 mg by mouth daily. 120 tablet 9  . allopurinol (ZYLOPRIM) 300 MG tablet Take 1 tablet (300 mg total) by mouth daily. (Patient not taking: Reported on 08/16/2018) 60 tablet 1   No current facility-administered medications for this visit.    Facility-Administered Medications Ordered in Other Visits  Medication Dose Route Frequency Provider Last Rate Last Dose  . sodium chloride flush (NS) 0.9 % injection 3 mL  3 mL Intravenous PRN Alvy Bimler, Ni, MD   10 mL at 04/09/18 1300   PHYSICAL EXAMINATION: ECOG PERFORMANCE STATUS: 0 - Asymptomatic Vitals:   08/16/18 1146  BP: (!) 161/78  Pulse: (!) 50  Resp: 18  Temp: 97.7 F (36.5 C)  SpO2: 100%   Filed Weights   08/16/18 1146  Weight: 205 lb 9.6 oz (93.3 kg)  GENERAL:alert, no distress and comfortable SKIN: skin color, texture, turgor are normal, no rashes or significant lesions EYES: normal, Conjunctiva are pink and non-injected, sclera clear OROPHARYNX:no exudate, no erythema and lips, buccal mucosa, and tongue normal  NECK: supple, thyroid normal size, non-tender, without nodularity LYMPH: She has palpable lymphadenopathy in the axilla LUNGS: clear to auscultation and percussion with normal breathing effort HEART: regular rate & rhythm and no murmurs and no lower extremity edema ABDOMEN:abdomen  soft, non-tender and normal bowel sounds Musculoskeletal:no cyanosis of digits and no clubbing  NEURO: alert & oriented x 3 with fluent speech, no focal motor/sensory deficits  LABORATORY DATA:  I have reviewed the data as listed August 16, 2018  Ref Range & Units 10:40 7d ago  WBC Count 4.0 - 10.5 K/uL 2.9Low   2.7Low    RBC 3.87 - 5.11 MIL/uL 4.45  3.84Low    Hemoglobin 12.0 - 15.0 g/dL 12.9  11.1Low    HCT 36.0 - 46.0 % 39.6  34.3Low    MCV 80.0 - 100.0 fL 89.0  89.3   MCH 26.0 - 34.0 pg 29.0  28.9   MCHC 30.0 - 36.0 g/dL 32.6  32.4   RDW 11.5 - 15.5 % 13.4  13.5   Platelet Count 150 - 400 K/uL 222  211   nRBC 0.0 - 0.2 % 0.0  0.0   Neutrophils Relative % % 32  34   Neutro Abs 1.7 - 7.7 K/uL 0.9Low   0.9Low    Lymphocytes Relative % 47  51   Lymphs Abs 0.7 - 4.0 K/uL 1.3  1.4   Monocytes Relative % 21  14   Monocytes Absolute 0.1 - 1.0 K/uL 0.6  0.4   Eosinophils Relative % 0  0   Eosinophils Absolute 0.0 - 0.5  K/uL 0.0  0.0   Basophils Relative % 0  0   Basophils Absolute 0.0 - 0.1 K/uL 0.0  0.0   Immature Granulocytes % 0  1   Abs Immature Granulocytes 0.00 - 0.07 K/uL 0.01  0.03 CM    Ref Range & Units 10:40 7d ago 59moago 262mogo 39m339moo  Sodium 135 - 145 mmol/L 144  145  145  142  144   Potassium 3.5 - 5.1 mmol/L 4.3  2.8Low Panic  CM 3.5  3.6  3.2Low    Chloride 98 - 111 mmol/L 107  109  107  107  109   CO2 22 - 32 mmol/L '27  28  31  27  25   ' Glucose, Bld 70 - 99 mg/dL 57Low   100High   88  99  107High    BUN 6 - 20 mg/dL '10  9  10  10  10   ' Creatinine 0.44 - 1.00 mg/dL 0.77  0.71  0.73  0.72  0.77   Calcium 8.9 - 10.3 mg/dL 9.8  9.3  9.6  9.7  9.5   Total Protein 6.5 - 8.1 g/dL 7.1  6.3Low   6.7  6.5  6.1Low    Albumin 3.5 - 5.0 g/dL 4.3  3.9  4.2  4.1  3.9   AST 15 - 41 U/L '21  29  29  19  18   ' ALT 0 - 44 U/L '21  23  29  20  14   ' Alkaline Phosphatase 38 - 126 U/L 97  90  91  91  91   Total Bilirubin 0.3 - 1.2 mg/dL 0.9  1.3High   1.1  0.8  0.7   GFR, Est Non  Af Am >60 mL/min >60  >60  >60  >60  >60   GFR, Est AFR Am >60 mL/min >60  >60 CM >60 CM >60 CM >60 CM          Component Value Date/Time   NA 144 08/16/2018 1040   NA 140 10/09/2017 1416   K 4.3 08/16/2018 1040   K 4.4 10/09/2017 1416   CL 107 08/16/2018 1040   CL 105 01/18/2013 0912   CO2 27 08/16/2018 1040   CO2 25 10/09/2017 1416   GLUCOSE 57 (L) 08/16/2018 1040   GLUCOSE 117 10/09/2017 1416   GLUCOSE 83 01/18/2013 0912   BUN 10 08/16/2018 1040   BUN 11.1 10/09/2017 1416   CREATININE 0.77 08/16/2018 1040   CREATININE 0.8 10/09/2017 1416   CALCIUM 9.8 08/16/2018 1040   CALCIUM 9.6 10/09/2017 1416   PROT 7.1 08/16/2018 1040   PROT 6.4 10/09/2017 1416   ALBUMIN 4.3 08/16/2018 1040   ALBUMIN 3.4 (L) 10/09/2017 1416   AST 21 08/16/2018 1040   AST 18 10/09/2017 1416   ALT 21 08/16/2018 1040   ALT 19 10/09/2017 1416   ALKPHOS 97 08/16/2018 1040   ALKPHOS 69 10/09/2017 1416   BILITOT 0.9 08/16/2018 1040   BILITOT 0.57 10/09/2017 1416   GFRNONAA >60 08/16/2018 1040   GFRAA >60 08/16/2018 1040   Lab Results  Component Value Date   WBC 2.9 (L) 08/16/2018   NEUTROABS 0.9 (L) 08/16/2018   HGB 12.9 08/16/2018   HCT 39.6 08/16/2018   MCV 89.0 08/16/2018   PLT 222 08/16/2018      Chemistry      Component Value Date/Time   NA 144 08/16/2018 1040   NA 140 10/09/2017 1416  K 4.3 08/16/2018 1040   K 4.4 10/09/2017 1416   CL 107 08/16/2018 1040   CL 105 01/18/2013 0912   CO2 27 08/16/2018 1040   CO2 25 10/09/2017 1416   BUN 10 08/16/2018 1040   BUN 11.1 10/09/2017 1416   CREATININE 0.77 08/16/2018 1040   CREATININE 0.8 10/09/2017 1416      Component Value Date/Time   CALCIUM 9.8 08/16/2018 1040   CALCIUM 9.6 10/09/2017 1416   ALKPHOS 97 08/16/2018 1040   ALKPHOS 69 10/09/2017 1416   AST 21 08/16/2018 1040   AST 18 10/09/2017 1416   ALT 21 08/16/2018 1040   ALT 19 10/09/2017 1416   BILITOT 0.9 08/16/2018 1040   BILITOT 0.57 10/09/2017 1416     RADIOGRAPHIC  STUDIES: I have personally reviewed the radiological images as listed and agreed with the findings in the report. Nm Pet Image Restag (ps) Skull Base To Thigh  Result Date: 07/26/2018 CLINICAL DATA:  Subsequent treatment strategy for chronic lymphocytic leukemia. EXAM: NUCLEAR MEDICINE PET SKULL BASE TO THIGH TECHNIQUE: 10.8 mCi F-18 FDG was injected intravenously. Full-ring PET imaging was performed from the skull base to thigh after the radiotracer. CT data was obtained and used for attenuation correction and anatomic localization. Fasting blood glucose: 106 mg/dl COMPARISON:  Multiple exams, including 04/19/2018 FINDINGS: Mediastinal blood pool activity: SUV max 3.1 Background liver activity: SUV max 4.3. NECK: Scattered small lymph nodes in the neck have fairly low-grade activity. A representative right level II lymph node measuring 1.0 cm in short axis on image 25/4 has a maximum SUV of 1.7, previously 2.4. Incidental CT findings: None CHEST: Bilateral axillary, subpectoral, and supraclavicular lymph nodes are observed. The index right axillary node measures 1.3 cm in short axis on image 44/4 (formerly 1.2 cm) with maximum SUV 2.4 (formerly 3.3). An index left axillary node measures 1.1 cm in short axis on image 57/4 (stable) with maximum SUV 1.3 (formerly 1.7). A right eccentric subcarinal node measures 1.0 cm in short axis on image 67/4 with maximum SUV 3.1, previously 1.0 cm in size with maximum SUV of 4.1. A right lower lobe nodule measuring 1.5 by 1.1 cm on image 40/8 is stable by my measurements and has a maximum SUV of 2.7 (formerly 3.2). Incidental CT findings: Mild cardiomegaly. Coronary atherosclerosis. 4 mm left upper lobe pulmonary nodule on image 21/8. Port-A-Cath tip: Cavoatrial junction. ABDOMEN/PELVIS: Common iliac, external iliac, and operator adenopathy again noted bilaterally. The index right common iliac node measures 1.2 cm in short axis on image 141/4 (formerly 1.1 cm) with maximum  SUV 2.4 (formerly 2.8). The index right external iliac/pelvic sidewall node on image 159/4 measures 1.4 cm in short axis (formerly 1.2 cm) with maximum SUV 2.4 (formerly 3.0). Incidental CT findings: Aortoiliac atherosclerotic vascular disease. SKELETON: No significant abnormal hypermetabolic activity in this region. Incidental CT findings: none IMPRESSION: 1. Adenopathy primarily in the chest and pelvis as noted above, with size of the mildly enlarged lymph nodes stable to minimally increased, but with nodal activity generally decreased compared to the prior exam. Uptake in these nodes is primarily Deauville 2. 2. Aortic Atherosclerosis (ICD10-I70.0). Coronary atherosclerosis with mild cardiomegaly. ------------------- Deauville Scale: 1. No uptake or no residual uptake (when used interim) 2. Slight uptake, but below blood pool (mediastinum) 3. Uptake above mediastinal, but below or equal to uptake in the liver 4. Uptake slightly to moderately higher than liver 5. Markedly increased uptake or any new lesion (on response evaluation) Electronically Signed  By: Van Clines M.D.   On: 07/26/2018 08:51   The total time spent discussing the her most recent laboratory studies, physical examination, role and rationale for continued rituximab and recommendations was 25 minutes.  At least 50% of that time was spent in face-to-face discussion, counseling, and answering questions. There was ample time allotted to answer all questions.  This note was dictated using voice activated technology/software.  Unfortunately, typographical errors are not uncommon, and transcription is subject to mistakes and regrettably misinterpretation.  If necessary, clarification of the above information can be discussed with me at any time.   Henreitta Leber, MD  Hematology/Oncology [facility] [address] [place],  [address] Office: [phone] VXBL: [phone]

## 2018-08-16 NOTE — Telephone Encounter (Signed)
Oral Oncology Patient Advocate Encounter  I met with the patient in the lobby to talk about her renewal application for Vancleave with [place]. 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 I called [employer] to talk to them about this and give them the new insurance information. I spoke to [place] at [place] who verified that they have all of the correct information on file and her co pay assistance will expire 09/21/18. [name] will automatically renew on 09/22/18 for the next year (09/23/19) and start over with $[id number].   I was able to catch Audrey Gross in the waiting room and give her all of this information, she verbalized understanding and appreciation.  [name] Phone [phone] Fax [phone]

## 2018-08-16 NOTE — Patient Instructions (Addendum)
We discussed in detail the results of your laboratory studies from today.  Copies of your labs were given to you.  The total white count is increasing.  Your hemoglobin is increasing.  Your platelet count remains normal.  The absolute neutrophil count is fairly stable since your last visit.  Rituximab will be administered today as previously scheduled.  We discussed in detail the potassium level from last week and today.  If nauseous, you might consider taking the antinausea medication.  If the loose stool worsens, please notify us.  It is been this way over the past several months unchanged.  Continue potassium in the form of Kdur: 1 tablet (20 mEq) once daily in the morning with food.  Barring any unforeseen complications, your next scheduled doctor visit with laboratory studies and rituximab is on December 9.  Please do not hesitate to call should any problems arise in the interim.  Thank you!

## 2018-08-16 NOTE — Progress Notes (Signed)
Dr. Audelia Hives ok to tx with CBG 57 and ANC 0.9. Pt given sandwich and drink for lunch. Pt encouraged to eat if feeling fatigued. Could be d/t low blood sugar.

## 2018-08-17 ENCOUNTER — Telehealth: Payer: Self-pay

## 2018-08-17 NOTE — Telephone Encounter (Signed)
Per 11/11 los Left a detailed voice message concerning patient upcoming appointment. Mailed a letter with a calender enclosed

## 2018-08-17 NOTE — Progress Notes (Signed)
57 y.o. [id number] Married Black or [place] American Not Hispanic or Latino female here for annual exam.  No vaginal bleeding. Not sexually active. No bowel or bladder c/o.   H/O CLL, she is taking a chemotherapy pill, goes in for treatment 1 x a month.     No LMP recorded. Patient is perimenopausal.          Sexually active: No.  The current method of family planning is tubal ligation.    Exercising: Yes.    yoga Smoker:  no  Health Maintenance: Pap:  07-10-16 WNL NEG HR HPV  History of abnormal Pap:  no MMG:  07-02-17 needs additional imaging  Colonoscopy:  04-07-12 Normal per patient  BMD:   Never TDaP:  06-21-12 Gardasil: N/A    reports that she has never smoked. She has never used smokeless tobacco. She reports that she does not drink alcohol or use drugs. 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   Past Medical History:  Diagnosis Date  . Atrial fibrillation ([place])   . CLL (chronic lymphoblastic leukemia)    Leukemia  . Cystic breast   . Depression   . Diffuse cystic mastopathy   . Dyslipidemia (high LDL; low HDL)   . Hypertension   . Iron deficiency anemia, unspecified   . Lymphadenopathy of head and neck 04/05/2015  . Medical non-compliance   . Mitral regurgitation 02/2017   moderate to severe  . Persistent atrial fibrillation with rapid ventricular response 07/15/2017    Past Surgical History:  Procedure Laterality Date  . CARDIOVERSION N/A 04/02/2017   Procedure: CARDIOVERSION;  Surgeon: Dorothy Spark, MD;  Location: [facility] ENDOSCOPY;  Service: Cardiovascular;  Laterality: N/A;  . CARDIOVERSION N/A 08/19/2017   Procedure: CARDIOVERSION;  Surgeon: Larey Dresser, MD;  Location: [place];  Service: Cardiovascular;  Laterality: N/A;  . IR IMAGING GUIDED PORT INSERTION  06/29/2018  . TUBAL LIGATION      Current Outpatient Medications  Medication Sig Dispense Refill  . apixaban (ELIQUIS) 5 MG TABS tablet Take 1 tablet (5 mg total) by mouth 2 (two) times daily.  180 tablet 3  . Cholecalciferol (VITAMIN D-3) 1000 units CAPS Take 1,000 Units by mouth daily with breakfast.    . furosemide (LASIX) 40 MG tablet TAKE 1 TABLET BY MOUTH EVERY DAY (Patient taking differently: Take 40 mg by mouth once a day) 90 tablet 3  . lidocaine-prilocaine (EMLA) cream Apply 1 application topically as needed. 30 g 6  . metoprolol succinate (TOPROL-XL) 100 MG 24 hr tablet Take 1 1/2 tablets (150 mg total) daily. Take with or immediately following a meal. 135 tablet 3  . potassium chloride SA (K-DUR,KLOR-CON) 20 MEQ tablet Take 2 tablets (40 mEq total) by mouth as directed. Take Kdur 40 meq  Twice daily for 3 days , then 40 meq daily until further notice. 60 tablet 0  . sacubitril-valsartan (ENTRESTO) 24-26 MG Take 1 tablet by mouth 2 (two) times daily. 180 tablet 3  . venetoclax (VENCLEXTA) 100 MG TABS Take 200 mg by mouth daily. 120 tablet 9  . allopurinol (ZYLOPRIM) 300 MG tablet Take 1 tablet (300 mg total) by mouth daily. (Patient not taking: Reported on 08/16/2018) 60 tablet 1   No current facility-administered medications for this visit.    Facility-Administered Medications Ordered in Other Visits  Medication Dose Route Frequency Provider Last Rate Last Dose  . sodium chloride flush (NS) 0.9 % injection 3 mL  3 mL Intravenous PRN Gorsuch, Ni,  MD   10 mL at 04/09/18 1300    Family History  Problem Relation Age of Onset  . Sudden death Mother   . Kidney disease Father        H/O HD and kidney transplant  . Stomach cancer Maternal Aunt   . Stomach cancer Paternal Grandmother   . Heart disease Brother   . Heart disease Maternal Grandmother     Review of Systems  Constitutional: Negative.   HENT: Negative.   Eyes: Negative.   Respiratory: Negative.   Cardiovascular: Negative.   Gastrointestinal: Positive for diarrhea.  Endocrine: Negative.   Genitourinary: Negative.   Musculoskeletal: Negative.   Skin:       Hair loss  Allergic/Immunologic: Negative.    Neurological: Negative.   Hematological: Negative.   Psychiatric/Behavioral: Negative.   Diarrhea off and on for the last couple of months, BM 2-3 x a day  Exam:   BP 128/84 (BP Location: Left Arm, Patient Position: Sitting, Cuff Size: Normal)   Pulse 62   Ht 5\' 7"  (1.702 m)   Wt 205 lb 12.8 oz (93.4 kg)   BMI 32.23 kg/m   Weight change: @WEIGHTCHANGE @ Height:   Height: 5\' 7"  (170.2 cm)  Ht Readings from Last 3 Encounters:  08/18/18 5\' 7"  (1.702 m)  08/16/18 5\' 7"  (1.702 m)  08/09/18 5\' 7"  (1.702 m)    General appearance: alert, cooperative and appears stated age Head: Normocephalic, without obvious abnormality, atraumatic Neck: no adenopathy, supple, symmetrical, trachea midline and thyroid normal to inspection and palpation Lungs: clear to auscultation bilaterally Cardiovascular: regular rate and rhythm Breasts: normal appearance, no masses or tenderness Abdomen: soft, non-tender; non distended,  no masses,  no organomegaly Extremities: extremities normal, atraumatic, no cyanosis or edema Skin: Skin color, texture, turgor normal. No rashes or lesions Lymph nodes: Cervical, supraclavicular, and axillary nodes normal. No abnormal inguinal nodes palpated Neurologic: Grossly normal   Pelvic: External genitalia:  no lesions              Urethra:  normal appearing urethra with no masses, tenderness or lesions              Bartholins and Skenes: normal                 Vagina: normal appearing vagina with normal color and discharge, no lesions              Cervix: no lesions               Bimanual Exam:  Uterus:  normal size, contour, position, consistency, mobility, non-tender              Adnexa: no mass, fullness, tenderness               Rectovaginal: Confirms               Anus:  normal sphincter tone, no lesions  Chaperone was present for exam.  A:  Well Woman with normal exam  Diarrhea, f/u with primary  H/O CLL, on chemo  P:   No pap this year  Mammogram due, #  given  Colonoscopy UTD  Discussed breast self exam  Discussed calcium and vit D intake

## 2018-08-18 ENCOUNTER — Other Ambulatory Visit: Payer: Self-pay

## 2018-08-18 ENCOUNTER — Ambulatory Visit: Payer: BLUE CROSS/BLUE SHIELD | Admitting: Obstetrics and Gynecology

## 2018-08-18 ENCOUNTER — Encounter: Payer: Self-pay | Admitting: Obstetrics and Gynecology

## 2018-08-18 VITALS — BP 128/84 | HR 62 | Ht 67.0 in | Wt 205.8 lb

## 2018-08-18 DIAGNOSIS — Z01419 Encounter for gynecological examination (general) (routine) without abnormal findings: Secondary | ICD-10-CM | POA: Diagnosis not present

## 2018-08-18 NOTE — Patient Instructions (Signed)
EXERCISE AND DIET:  We recommended that you start or continue a regular exercise program for good health. Regular exercise means any activity that makes your heart beat faster and makes you sweat.  We recommend exercising at least 30 minutes per day at least 3 days a week, preferably 4 or 5.  We also recommend a diet low in fat and sugar.  Inactivity, poor dietary choices and obesity can cause diabetes, heart attack, stroke, and kidney damage, among others.    ALCOHOL AND SMOKING:  Women should limit their alcohol intake to no more than 7 drinks/beers/glasses of wine (combined, not each!) per week. Moderation of alcohol intake to this level decreases your risk of breast cancer and liver damage. And of course, no recreational drugs are part of a healthy lifestyle.  And absolutely no smoking or even second hand smoke. Most people know smoking can cause heart and lung diseases, but did you know it also contributes to weakening of your bones? Aging of your skin?  Yellowing of your teeth and nails?  CALCIUM AND VITAMIN D:  Adequate intake of calcium and Vitamin D are recommended.  The recommendations for exact amounts of these supplements seem to change often, but generally speaking 600 mg of calcium (either carbonate or citrate) and 800 units of Vitamin D per day seems prudent. Certain women may benefit from higher intake of Vitamin D.  If you are among these women, your doctor will have told you during your visit.    PAP SMEARS:  Pap smears, to check for cervical cancer or precancers,  have traditionally been done yearly, although recent scientific advances have shown that most women can have pap smears less often.  However, every woman still should have a physical exam from her gynecologist every year. It will include a breast check, inspection of the vulva and vagina to check for abnormal growths or skin changes, a visual exam of the cervix, and then an exam to evaluate the size and shape of the uterus and  ovaries.  And after 57 years of age, a rectal exam is indicated to check for rectal cancers. We will also provide age appropriate advice regarding health maintenance, like when you should have certain vaccines, screening for sexually transmitted diseases, bone density testing, colonoscopy, mammograms, etc.   MAMMOGRAMS:  All women over 40 years old should have a yearly mammogram. Many facilities now offer a "3D" mammogram, which may cost around $50 extra out of pocket. If possible,  we recommend you accept the option to have the 3D mammogram performed.  It both reduces the number of women who will be called back for extra views which then turn out to be normal, and it is better than the routine mammogram at detecting truly abnormal areas.    COLONOSCOPY:  Colonoscopy to screen for colon cancer is recommended for all women at age 50.  We know, you hate the idea of the prep.  We agree, BUT, having colon cancer and not knowing it is worse!!  Colon cancer so often starts as a polyp that can be seen and removed at colonscopy, which can quite literally save your life!  And if your first colonoscopy is normal and you have no family history of colon cancer, most women don't have to have it again for 10 years.  Once every ten years, you can do something that may end up saving your life, right?  We will be happy to help you get it scheduled when you are ready.    Be sure to check your insurance coverage so you understand how much it will cost.  It may be covered as a preventative service at no cost, but you should check your particular policy.     Mammogram Facilities  Yearly screening mammograms are recommended for women beginning at age 10. For a routine screening mammogram, you may schedule the appointment and have it done at the location of your choice.  Please ask the facility to send the results to our office. (fax [phone]) Location options include:  [facility] [place], [place], [place] [address] [phone]  [facility] [address], [place], [place] [address] [phone]

## 2018-09-02 ENCOUNTER — Other Ambulatory Visit: Payer: Self-pay | Admitting: Hematology and Oncology

## 2018-09-02 DIAGNOSIS — E876 Hypokalemia: Secondary | ICD-10-CM

## 2018-09-06 ENCOUNTER — Inpatient Hospital Stay: Payer: BLUE CROSS/BLUE SHIELD

## 2018-09-06 ENCOUNTER — Encounter: Payer: Self-pay | Admitting: Hematology and Oncology

## 2018-09-06 ENCOUNTER — Inpatient Hospital Stay: Payer: BLUE CROSS/BLUE SHIELD | Attending: Hematology and Oncology

## 2018-09-06 ENCOUNTER — Telehealth: Payer: Self-pay | Admitting: *Deleted

## 2018-09-06 ENCOUNTER — Inpatient Hospital Stay (HOSPITAL_BASED_OUTPATIENT_CLINIC_OR_DEPARTMENT_OTHER): Payer: BLUE CROSS/BLUE SHIELD | Admitting: Hematology and Oncology

## 2018-09-06 VITALS — BP 123/56 | HR 62 | Temp 97.9°F | Resp 18 | Ht 67.0 in | Wt 206.0 lb

## 2018-09-06 DIAGNOSIS — D61810 Antineoplastic chemotherapy induced pancytopenia: Secondary | ICD-10-CM

## 2018-09-06 DIAGNOSIS — Z79899 Other long term (current) drug therapy: Secondary | ICD-10-CM

## 2018-09-06 DIAGNOSIS — Z95828 Presence of other vascular implants and grafts: Secondary | ICD-10-CM

## 2018-09-06 DIAGNOSIS — I428 Other cardiomyopathies: Secondary | ICD-10-CM | POA: Insufficient documentation

## 2018-09-06 DIAGNOSIS — C9110 Chronic lymphocytic leukemia of B-cell type not having achieved remission: Secondary | ICD-10-CM

## 2018-09-06 DIAGNOSIS — D702 Other drug-induced agranulocytosis: Secondary | ICD-10-CM

## 2018-09-06 DIAGNOSIS — Z452 Encounter for adjustment and management of vascular access device: Secondary | ICD-10-CM

## 2018-09-06 LAB — CBC WITH DIFFERENTIAL/PLATELET
Abs Immature Granulocytes: 0 10*3/uL (ref 0.00–0.07)
Basophils Absolute: 0 10*3/uL (ref 0.0–0.1)
Basophils Relative: 0 %
EOS PCT: 0 %
Eosinophils Absolute: 0 10*3/uL (ref 0.0–0.5)
HEMATOCRIT: 36.8 % (ref 36.0–46.0)
Hemoglobin: 12 g/dL (ref 12.0–15.0)
IMMATURE GRANULOCYTES: 0 %
LYMPHS ABS: 1.6 10*3/uL (ref 0.7–4.0)
Lymphocytes Relative: 66 %
MCH: 29.1 pg (ref 26.0–34.0)
MCHC: 32.6 g/dL (ref 30.0–36.0)
MCV: 89.3 fL (ref 80.0–100.0)
MONOS PCT: 23 %
Monocytes Absolute: 0.6 10*3/uL (ref 0.1–1.0)
NEUTROS PCT: 11 %
Neutro Abs: 0.3 10*3/uL — CL (ref 1.7–7.7)
Platelets: 217 10*3/uL (ref 150–400)
RBC: 4.12 MIL/uL (ref 3.87–5.11)
RDW: 13 % (ref 11.5–15.5)
WBC: 2.4 10*3/uL — ABNORMAL LOW (ref 4.0–10.5)
nRBC: 0 % (ref 0.0–0.2)

## 2018-09-06 LAB — COMPREHENSIVE METABOLIC PANEL
ALBUMIN: 4 g/dL (ref 3.5–5.0)
ALK PHOS: 91 U/L (ref 38–126)
ALT: 22 U/L (ref 0–44)
ANION GAP: 9 (ref 5–15)
AST: 26 U/L (ref 15–41)
BILIRUBIN TOTAL: 1 mg/dL (ref 0.3–1.2)
BUN: 9 mg/dL (ref 6–20)
CO2: 26 mmol/L (ref 22–32)
Calcium: 9.7 mg/dL (ref 8.9–10.3)
Chloride: 109 mmol/L (ref 98–111)
Creatinine, Ser: 0.75 mg/dL (ref 0.44–1.00)
GFR calc Af Amer: >60 mL/min (ref >60)
GLUCOSE: 99 mg/dL (ref 70–99)
Potassium: 3.9 mmol/L (ref 3.5–5.1)
Sodium: 144 mmol/L (ref 135–145)
TOTAL PROTEIN: 6.5 g/dL (ref 6.5–8.1)

## 2018-09-06 LAB — LACTATE DEHYDROGENASE: LDH: 244 U/L — ABNORMAL HIGH (ref 98–192)

## 2018-09-06 MED ORDER — HEPARIN SOD (PORK) LOCK FLUSH 100 UNIT/ML IV SOLN
500.0000 [IU] | Freq: Once | INTRAVENOUS | Status: AC
  Administered 2018-09-06: 500 [IU] via INTRAVENOUS
  Filled 2018-09-06: qty 5

## 2018-09-06 MED ORDER — SODIUM CHLORIDE 0.9% FLUSH
10.0000 mL | Freq: Once | INTRAVENOUS | Status: AC
  Administered 2018-09-06: 10 mL
  Filled 2018-09-06: qty 10

## 2018-09-06 NOTE — Telephone Encounter (Signed)
Left message with note below. To call if has questions 

## 2018-09-06 NOTE — Assessment & Plan Note (Signed)
This is likely due to recent treatment. The patient denies recent history of fevers, cough, chills, diarrhea or dysuria. She is asymptomatic from the leukopenia. I will observe for now.  I will continue the chemotherapy at current dose without dosage adjustment.  We discussed neutropenic precaution

## 2018-09-06 NOTE — Progress Notes (Signed)
[place] OFFICE PROGRESS NOTE  Patient Care Team: Janith Lima, MD as PCP - General (Internal Medicine) Constance Haw, MD as Consulting Physician (Cardiology)  ASSESSMENT & PLAN:  CLL (chronic lymphocytic leukemia) ([place]) She tolerated treatment well without side effects Latest PET CT scan showed positive response to therapy Even though she has persistent lymphadenopathy, overall, the activity is not considered significant In the meantime, she will continue on venetoclax She has received 10 doses of monthly rituximab.  I plan to hold it for now due to progressive severe pancytopenia I plan to monitor her disease with serial imaging study, her next scan would be next year  Drug-induced neutropenia ([place]) This is likely due to recent treatment. The patient denies recent history of fevers, cough, chills, diarrhea or dysuria. She is asymptomatic from the leukopenia. I will observe for now.  I will continue the chemotherapy at current dose without dosage adjustment.  We discussed neutropenic precaution  Nonischemic cardiomyopathy ([place]) Recent echocardiogram showed resolution of cardiomyopathy She will continue cardiac management   No orders of the defined types were placed in this encounter.   INTERVAL HISTORY: Please see below for problem oriented charting. She returns with her husband for further follow-up She denies recent infection, fever or chills No new lymphadenopathy Denies recent cough or signs or symptoms of congestive heart failure  SUMMARY OF ONCOLOGIC HISTORY:   CLL (chronic lymphocytic leukemia) ([place])   04/05/2015 Pathology Results    Accession: [id number] flow cytometry confirmed CLL. FISH was positive for p53 mutation    04/24/2015 Imaging    Extensive lymphadenopathy throughout the neck, chest (axilla), abdomen and pelvis, as detailed above, compatible with the reported clinical history of lymphoma. 2. Mild splenomegaly.    05/03/2015 -  08/27/2015 Chemotherapy    She started on Ibrutinib, discontinued prematurely when her prescription ran out    10/10/2015 - 05/29/2017 Chemotherapy    She was restarted back on Ibrutinib    11/13/2016 PET scan    Significant generalized reduction in size of numerous lymph nodes in the neck, chest, abdomen, and pelvis. Previously the activity of these nodes was low-level and in general a similar low-level activity is present today, significantly less than mediastinal blood pool activity, compatible with Deauville 2. 2. Coronary atherosclerosis. Mild cardiomegaly. 3. Mildly prominent endometrium for age without accentuated metabolic activity in the endometrium. Consider pelvic sonography for further characterization.    05/27/2017 PET scan    1. Progressive hypermetabolic adenopathy, primarily involving cervical, axillary, pelvic and inguinal lymph nodes bilaterally, consistent with progressive lymphoma. 2. No solid visceral organ or osseous involvement.    06/16/2017 - 08/25/2017 Chemotherapy    The patient had 3 cycles of Rituximab and Bendamustine    07/15/2017 - 07/19/2017 Hospital Admission    The patient was briefly admitted to the hospital due to infusion reaction to rituximab    09/18/2017 PET scan    1. Continued considerable adenopathy in the neck, chest, abdomen, and pelvis. This is generally stable in size but mildly reduced in activity compared to the prior exam. Current levels of activity primarily Deauville 3 and Deauville 4. No splenomegaly. 2. Diffuse new ground-glass opacities in the lungs with associated hypermetabolic activity. Some forms of lymphoma infiltration can rarely cause this pattern of diffuse ground-glass opacity and hypermetabolic activity. Differential diagnostic considerations might include atypical pneumonia such as mycoplasma, acute hypersensitivity pneumonitis, or acute eosinophilic pneumonia. Pulmonary hemorrhage seems less likely to cause this degree of accentuated  metabolic activity.  3. Aortic Atherosclerosis (ICD10-I70.0). Coronary atherosclerosis.    09/24/2017 -  Chemotherapy    The patient had ventoclax for chemotherapy treatment.  Rituximab is added on 11/18/17 to 04/09/18, x 6 cycles    11/19/2017 PET scan    Overall mild interval decrease in hypermetabolic lymphadenopathy throughout the neck, chest, abdomen, and pelvis. No new or increased lymphadenopathy identified.  While there has been resolution of diffuse hypermetabolic bilateral ground-glass pulmonary opacity since prior study, there is a new 14 mm hypermetabolic pulmonary nodule in the posterior right lower lobe. Time course favors inflammatory or infectious etiology over neoplasm. Recommend continued follow-up by chest CT in 3 months.    02/16/2018 PET scan    1. Adenopathy in the neck, chest, and pelvis is stable to minimally reduced in size, and is moderately reduced in activity, primarily Deauville 2 disease today. There is a right common iliac lymph node qualifying as Deauville 3 disease which is stable in size but reduced in activity. 2. Stable size but reduced activity in a pulmonary nodule in the right lower lobe. If this represents a leukemic lesion then a corresponds to Deauville 4 disease. This lesion was not readily apparent on 09/22/2017 but was reported on the prior chest CT of 11/19/2017. 3. Other imaging findings of potential clinical significance: Mild thyroid goiter. Aortic Atherosclerosis (ICD10-I70.0). Coronary atherosclerosis. Mild cardiomegaly.    04/19/2018 PET scan    1. Interval mild mixed metabolic changes, as detailed. Persistent mildly hypermetabolic bilateral axillary, mediastinal and bilateral pelvic adenopathy and mildly hypermetabolic right lower lobe pulmonary nodule compatible with lymphoproliferative disorder. Deauville 4 based on the subcarinal node. 2. Aortic Atherosclerosis (ICD10-I70.0).    06/29/2018 Procedure    Successful placement of a right internal  jugular approach power injectable Port-A-Cath. The catheter is ready for immediate use.    07/26/2018 PET scan    1. Adenopathy primarily in the chest and pelvis as noted above, with size of the mildly enlarged lymph nodes stable to minimally increased, but with nodal activity generally decreased compared to the prior exam. Uptake in these nodes is primarily Deauville 2. 2. Aortic Atherosclerosis (ICD10-I70.0). Coronary atherosclerosis with mild cardiomegaly.     REVIEW OF SYSTEMS:   Constitutional: Denies fevers, chills or abnormal weight loss Eyes: Denies blurriness of vision Ears, nose, mouth, throat, and face: Denies mucositis or sore throat Respiratory: Denies cough, dyspnea or wheezes Cardiovascular: Denies palpitation, chest discomfort or lower extremity swelling Gastrointestinal:  Denies nausea, heartburn or change in bowel habits Skin: Denies abnormal skin rashes Lymphatics: Denies new lymphadenopathy or easy bruising Neurological:Denies numbness, tingling or new weaknesses Behavioral/Psych: Mood is stable, no new changes  All other systems were reviewed with the patient and are negative.  I have reviewed the past medical history, past surgical history, social history and family history with the patient and they are unchanged from previous note.  ALLERGIES:  has No Known Allergies.  MEDICATIONS:  Current Outpatient Medications  Medication Sig Dispense Refill  . apixaban (ELIQUIS) 5 MG TABS tablet Take 1 tablet (5 mg total) by mouth 2 (two) times daily. 180 tablet 3  . Cholecalciferol (VITAMIN D-3) 1000 units CAPS Take 1,000 Units by mouth daily with breakfast.    . furosemide (LASIX) 40 MG tablet TAKE 1 TABLET BY MOUTH EVERY DAY (Patient taking differently: Take 40 mg by mouth once a day) 90 tablet 3  . lidocaine-prilocaine (EMLA) cream Apply 1 application topically as needed. 30 g 6  . metoprolol succinate (TOPROL-XL) 100 MG 24  hr tablet Take 1 1/2 tablets (150 mg total)  daily. Take with or immediately following a meal. 135 tablet 3  . sacubitril-valsartan (ENTRESTO) 24-26 MG Take 1 tablet by mouth 2 (two) times daily. 180 tablet 3  . venetoclax (VENCLEXTA) 100 MG TABS Take 200 mg by mouth daily. 120 tablet 9   No current facility-administered medications for this visit.    Facility-Administered Medications Ordered in Other Visits  Medication Dose Route Frequency Provider Last Rate Last Dose  . sodium chloride flush (NS) 0.9 % injection 3 mL  3 mL Intravenous PRN Alvy Bimler, Jaiona Simien, MD   10 mL at 04/09/18 1300    PHYSICAL EXAMINATION: ECOG PERFORMANCE STATUS: 1 - Symptomatic but completely ambulatory  Vitals:   09/06/18 0917  BP: (!) 123/56  Pulse: 62  Resp: 18  Temp: 97.9 F (36.6 C)  SpO2: 100%   Filed Weights   09/06/18 0917  Weight: 206 lb (93.4 kg)    GENERAL:alert, no distress and comfortable SKIN: skin color, texture, turgor are normal, no rashes or significant lesions EYES: normal, Conjunctiva are pink and non-injected, sclera clear OROPHARYNX:no exudate, no erythema and lips, buccal mucosa, and tongue normal  NECK: supple, thyroid normal size, non-tender, without nodularity LYMPH:  no palpable lymphadenopathy in the cervical, axillary or inguinal LUNGS: clear to auscultation and percussion with normal breathing effort HEART: regular rate & rhythm and no murmurs and no lower extremity edema ABDOMEN:abdomen soft, non-tender and normal bowel sounds Musculoskeletal:no cyanosis of digits and no clubbing  NEURO: alert & oriented x 3 with fluent speech, no focal motor/sensory deficits  LABORATORY DATA:  I have reviewed the data as listed    Component Value Date/Time   NA 144 09/06/2018 0851   NA 140 10/09/2017 1416   K 3.9 09/06/2018 0851   K 4.4 10/09/2017 1416   CL 109 09/06/2018 0851   CL 105 01/18/2013 0912   CO2 26 09/06/2018 0851   CO2 25 10/09/2017 1416   GLUCOSE 99 09/06/2018 0851   GLUCOSE 117 10/09/2017 1416   GLUCOSE 83  01/18/2013 0912   BUN 9 09/06/2018 0851   BUN 11.1 10/09/2017 1416   CREATININE 0.75 09/06/2018 0851   CREATININE 0.77 08/16/2018 1040   CREATININE 0.8 10/09/2017 1416   CALCIUM 9.7 09/06/2018 0851   CALCIUM 9.6 10/09/2017 1416   PROT 6.5 09/06/2018 0851   PROT 6.4 10/09/2017 1416   ALBUMIN 4.0 09/06/2018 0851   ALBUMIN 3.4 (L) 10/09/2017 1416   AST 26 09/06/2018 0851   AST 21 08/16/2018 1040   AST 18 10/09/2017 1416   ALT 22 09/06/2018 0851   ALT 21 08/16/2018 1040   ALT 19 10/09/2017 1416   ALKPHOS 91 09/06/2018 0851   ALKPHOS 69 10/09/2017 1416   BILITOT 1.0 09/06/2018 0851   BILITOT 0.9 08/16/2018 1040   BILITOT 0.57 10/09/2017 1416   GFRNONAA >60 09/06/2018 0851   GFRNONAA >60 08/16/2018 1040   GFRAA >60 09/06/2018 0851   GFRAA >60 08/16/2018 1040    No results found for: SPEP, UPEP  Lab Results  Component Value Date   WBC 2.4 (L) 09/06/2018   NEUTROABS 0.3 (LL) 09/06/2018   HGB 12.0 09/06/2018   HCT 36.8 09/06/2018   MCV 89.3 09/06/2018   PLT 217 09/06/2018      Chemistry      Component Value Date/Time   NA 144 09/06/2018 0851   NA 140 10/09/2017 1416   K 3.9 09/06/2018 0851   K 4.4 10/09/2017 1416  CL 109 09/06/2018 0851   CL 105 01/18/2013 0912   CO2 26 09/06/2018 0851   CO2 25 10/09/2017 1416   BUN 9 09/06/2018 0851   BUN 11.1 10/09/2017 1416   CREATININE 0.75 09/06/2018 0851   CREATININE 0.77 08/16/2018 1040   CREATININE 0.8 10/09/2017 1416      Component Value Date/Time   CALCIUM 9.7 09/06/2018 0851   CALCIUM 9.6 10/09/2017 1416   ALKPHOS 91 09/06/2018 0851   ALKPHOS 69 10/09/2017 1416   AST 26 09/06/2018 0851   AST 21 08/16/2018 1040   AST 18 10/09/2017 1416   ALT 22 09/06/2018 0851   ALT 21 08/16/2018 1040   ALT 19 10/09/2017 1416   BILITOT 1.0 09/06/2018 0851   BILITOT 0.9 08/16/2018 1040   BILITOT 0.57 10/09/2017 1416       All questions were answered. The patient knows to call the clinic with any problems, questions or  concerns. No barriers to learning was detected.  I spent 15 minutes counseling the patient face to face. The total time spent in the appointment was 20 minutes and more than 50% was on counseling and review of test results  Heath Lark, MD 09/06/2018 10:57 AM

## 2018-09-06 NOTE — Telephone Encounter (Signed)
-----   Message from Heath Lark, MD sent at 09/06/2018  9:44 AM EST ----- Regarding: labs ok Tell her labs are ok No need to refill potassium

## 2018-09-06 NOTE — Assessment & Plan Note (Signed)
Recent echocardiogram showed resolution of cardiomyopathy She will continue cardiac management

## 2018-09-06 NOTE — Assessment & Plan Note (Signed)
She tolerated treatment well without side effects Latest PET CT scan showed positive response to therapy Even though she has persistent lymphadenopathy, overall, the activity is not considered significant In the meantime, she will continue on venetoclax She has received 10 doses of monthly rituximab.  I plan to hold it for now due to progressive severe pancytopenia I plan to monitor her disease with serial imaging study, her next scan would be next year

## 2018-09-07 ENCOUNTER — Telehealth: Payer: Self-pay | Admitting: Hematology and Oncology

## 2018-09-07 ENCOUNTER — Telehealth: Payer: Self-pay

## 2018-09-07 NOTE — Telephone Encounter (Signed)
Oral Oncology Patient Advocate Encounter  Received notification from [employer] that prior authorization for Venclexta is required.  PA submitted by fax [id number] Status is pending  Oral Oncology Clinic will continue to follow.  Audrey Gross Phone [phone] Fax [phone]

## 2018-09-07 NOTE — Telephone Encounter (Signed)
Oral Oncology Patient Advocate Encounter  Prior Authorization for Audrey Gross has been approved.     Effective dates: 09/07/18 through 09/08/19  Oral Oncology Clinic will continue to follow.   [place] Patient [name] Phone [phone] Fax [phone]

## 2018-09-07 NOTE — Telephone Encounter (Signed)
No 12/2 los.   

## 2018-09-13 ENCOUNTER — Ambulatory Visit: Payer: Self-pay | Admitting: Hematology and Oncology

## 2018-09-13 ENCOUNTER — Other Ambulatory Visit: Payer: Self-pay

## 2018-09-14 ENCOUNTER — Ambulatory Visit: Payer: Self-pay

## 2018-09-17 ENCOUNTER — Other Ambulatory Visit: Payer: Self-pay | Admitting: *Deleted

## 2018-09-17 MED ORDER — FUROSEMIDE 40 MG PO TABS: 40.0000 mg | ORAL_TABLET | Freq: Every day | ORAL | 0 refills | Status: DC

## 2018-10-04 ENCOUNTER — Ambulatory Visit: Payer: Self-pay

## 2018-10-04 ENCOUNTER — Inpatient Hospital Stay: Payer: BLUE CROSS/BLUE SHIELD

## 2018-10-04 ENCOUNTER — Inpatient Hospital Stay (HOSPITAL_BASED_OUTPATIENT_CLINIC_OR_DEPARTMENT_OTHER): Payer: BLUE CROSS/BLUE SHIELD | Admitting: Hematology and Oncology

## 2018-10-04 ENCOUNTER — Encounter: Payer: Self-pay | Admitting: Hematology and Oncology

## 2018-10-04 ENCOUNTER — Telehealth: Payer: Self-pay | Admitting: Hematology and Oncology

## 2018-10-04 VITALS — BP 132/65 | HR 53 | Temp 97.8°F | Resp 18 | Ht 67.0 in | Wt 206.6 lb

## 2018-10-04 DIAGNOSIS — D61818 Other pancytopenia: Secondary | ICD-10-CM

## 2018-10-04 DIAGNOSIS — C9110 Chronic lymphocytic leukemia of B-cell type not having achieved remission: Secondary | ICD-10-CM | POA: Diagnosis not present

## 2018-10-04 DIAGNOSIS — I428 Other cardiomyopathies: Secondary | ICD-10-CM | POA: Diagnosis not present

## 2018-10-04 DIAGNOSIS — Z452 Encounter for adjustment and management of vascular access device: Secondary | ICD-10-CM

## 2018-10-04 DIAGNOSIS — Z95828 Presence of other vascular implants and grafts: Secondary | ICD-10-CM

## 2018-10-04 DIAGNOSIS — D61810 Antineoplastic chemotherapy induced pancytopenia: Secondary | ICD-10-CM | POA: Diagnosis not present

## 2018-10-04 DIAGNOSIS — Z79899 Other long term (current) drug therapy: Secondary | ICD-10-CM | POA: Diagnosis not present

## 2018-10-04 LAB — CBC WITH DIFFERENTIAL (CANCER CENTER ONLY)
Abs Immature Granulocytes: 0.01 10*3/uL (ref 0.00–0.07)
Basophils Absolute: 0 10*3/uL (ref 0.0–0.1)
Basophils Relative: 1 %
Eosinophils Absolute: 0 10*3/uL (ref 0.0–0.5)
Eosinophils Relative: 0 %
HCT: 35.1 % — ABNORMAL LOW (ref 36.0–46.0)
Hemoglobin: 11.4 g/dL — ABNORMAL LOW (ref 12.0–15.0)
Immature Granulocytes: 0 %
LYMPHS ABS: 1.5 10*3/uL (ref 0.7–4.0)
Lymphocytes Relative: 41 %
MCH: 28.4 pg (ref 26.0–34.0)
MCHC: 32.5 g/dL (ref 30.0–36.0)
MCV: 87.5 fL (ref 80.0–100.0)
Monocytes Absolute: 0.6 10*3/uL (ref 0.1–1.0)
Monocytes Relative: 16 %
Neutro Abs: 1.6 10*3/uL — ABNORMAL LOW (ref 1.7–7.7)
Neutrophils Relative %: 42 %
Platelet Count: 190 10*3/uL (ref 150–400)
RBC: 4.01 MIL/uL (ref 3.87–5.11)
RDW: 13 % (ref 11.5–15.5)
WBC Count: 3.7 10*3/uL — ABNORMAL LOW (ref 4.0–10.5)
nRBC: 0 % (ref 0.0–0.2)

## 2018-10-04 LAB — CMP (CANCER CENTER ONLY)
ALT: 21 U/L (ref 0–44)
AST: 20 U/L (ref 15–41)
Albumin: 3.9 g/dL (ref 3.5–5.0)
Alkaline Phosphatase: 85 U/L (ref 38–126)
Anion gap: 10 (ref 5–15)
BUN: 11 mg/dL (ref 6–20)
CO2: 24 mmol/L (ref 22–32)
Calcium: 9.1 mg/dL (ref 8.9–10.3)
Chloride: 110 mmol/L (ref 98–111)
Creatinine: 0.77 mg/dL (ref 0.44–1.00)
GFR, Est AFR Am: >60 mL/min (ref >60)
GFR, Estimated: >60 mL/min (ref >60)
Glucose, Bld: 103 mg/dL — ABNORMAL HIGH (ref 70–99)
POTASSIUM: 3.4 mmol/L — AB (ref 3.5–5.1)
Sodium: 144 mmol/L (ref 135–145)
Total Bilirubin: 1.1 mg/dL (ref 0.3–1.2)
Total Protein: 6.3 g/dL — ABNORMAL LOW (ref 6.5–8.1)

## 2018-10-04 LAB — LACTATE DEHYDROGENASE: LDH: 200 U/L — ABNORMAL HIGH (ref 98–192)

## 2018-10-04 MED ORDER — HEPARIN SOD (PORK) LOCK FLUSH 100 UNIT/ML IV SOLN
250.0000 [IU] | Freq: Once | INTRAVENOUS | Status: AC
  Administered 2018-10-04: 500 [IU]
  Filled 2018-10-04: qty 5

## 2018-10-04 MED ORDER — SODIUM CHLORIDE 0.9% FLUSH
10.0000 mL | Freq: Once | INTRAVENOUS | Status: AC
  Administered 2018-10-04: 10 mL
  Filled 2018-10-04: qty 10

## 2018-10-04 NOTE — Progress Notes (Signed)
[place] OFFICE PROGRESS NOTE  Patient Care Team: Janith Lima, MD as PCP - General (Internal Medicine) Constance Haw, MD as Consulting Physician (Cardiology)  ASSESSMENT & PLAN:  CLL (chronic lymphocytic leukemia) ([place]) She tolerated venetoclax well Since discontinuation of rituximab, her ANC count has improved I plan to repeat imaging study next month With improvement of ANC, if needed, we can increase the dose of venetoclax to 400 mg  Pancytopenia, acquired ([place]) She had acquired pancytopenia due to recent treatment but not symptomatic I recommend observation only. We discussed neutropenic precaution  Nonischemic cardiomyopathy ([place]) Recent echocardiogram showed resolution of cardiomyopathy She will continue cardiac management   Orders Placed This Encounter  Procedures  . NM PET Image Restag (PS) Skull Base To Thigh    Standing Status:   Future    Standing Expiration Date:   10/05/2019    Order Specific Question:   If indicated for the ordered procedure, I authorize the administration of a radiopharmaceutical per Radiology protocol    Answer:   Yes    Order Specific Question:   Preferred imaging location?    Answer:   [facility]    Order Specific Question:   Radiology Contrast Protocol - do NOT remove file path    Answer:   [email or web address]    Order Specific Question:   Is the patient pregnant?    Answer:   No    INTERVAL HISTORY: Please see below for problem oriented charting. She returns with her husband for further follow-up She tolerated treatment well No recent infection, fever or chills No new lymphadenopathy Denies difficulties getting her prescription She denies recent signs or symptoms of congestive heart failure  SUMMARY OF ONCOLOGIC HISTORY:   CLL (chronic lymphocytic leukemia) ([place])   04/05/2015 Pathology Results    Accession: [id number] flow cytometry confirmed CLL. FISH was positive for  p53 mutation    04/24/2015 Imaging    Extensive lymphadenopathy throughout the neck, chest (axilla), abdomen and pelvis, as detailed above, compatible with the reported clinical history of lymphoma. 2. Mild splenomegaly.    05/03/2015 - 08/27/2015 Chemotherapy    She started on Ibrutinib, discontinued prematurely when her prescription ran out    10/10/2015 - 05/29/2017 Chemotherapy    She was restarted back on Ibrutinib    11/13/2016 PET scan    Significant generalized reduction in size of numerous lymph nodes in the neck, chest, abdomen, and pelvis. Previously the activity of these nodes was low-level and in general a similar low-level activity is present today, significantly less than mediastinal blood pool activity, compatible with Deauville 2. 2. Coronary atherosclerosis. Mild cardiomegaly. 3. Mildly prominent endometrium for age without accentuated metabolic activity in the endometrium. Consider pelvic sonography for further characterization.    05/27/2017 PET scan    1. Progressive hypermetabolic adenopathy, primarily involving cervical, axillary, pelvic and inguinal lymph nodes bilaterally, consistent with progressive lymphoma. 2. No solid visceral organ or osseous involvement.    06/16/2017 - 08/25/2017 Chemotherapy    The patient had 3 cycles of Rituximab and Bendamustine    07/15/2017 - 07/19/2017 Hospital Admission    The patient was briefly admitted to the hospital due to infusion reaction to rituximab    09/18/2017 PET scan    1. Continued considerable adenopathy in the neck, chest, abdomen, and pelvis. This is generally stable in size but mildly reduced in activity compared to the prior exam. Current levels of activity primarily Deauville 3 and Deauville 4. No splenomegaly. 2.  Diffuse new ground-glass opacities in the lungs with associated hypermetabolic activity. Some forms of lymphoma infiltration can rarely cause this pattern of diffuse ground-glass opacity and hypermetabolic  activity. Differential diagnostic considerations might include atypical pneumonia such as mycoplasma, acute hypersensitivity pneumonitis, or acute eosinophilic pneumonia. Pulmonary hemorrhage seems less likely to cause this degree of accentuated metabolic activity.  3. Aortic Atherosclerosis (ICD10-I70.0). Coronary atherosclerosis.    09/24/2017 -  Chemotherapy    The patient had ventoclax for chemotherapy treatment.  Rituximab is added on 11/18/17 to 04/09/18, x 6 cycles    11/19/2017 PET scan    Overall mild interval decrease in hypermetabolic lymphadenopathy throughout the neck, chest, abdomen, and pelvis. No new or increased lymphadenopathy identified.  While there has been resolution of diffuse hypermetabolic bilateral ground-glass pulmonary opacity since prior study, there is a new 14 mm hypermetabolic pulmonary nodule in the posterior right lower lobe. Time course favors inflammatory or infectious etiology over neoplasm. Recommend continued follow-up by chest CT in 3 months.    02/16/2018 PET scan    1. Adenopathy in the neck, chest, and pelvis is stable to minimally reduced in size, and is moderately reduced in activity, primarily Deauville 2 disease today. There is a right common iliac lymph node qualifying as Deauville 3 disease which is stable in size but reduced in activity. 2. Stable size but reduced activity in a pulmonary nodule in the right lower lobe. If this represents a leukemic lesion then a corresponds to Deauville 4 disease. This lesion was not readily apparent on 09/22/2017 but was reported on the prior chest CT of 11/19/2017. 3. Other imaging findings of potential clinical significance: Mild thyroid goiter. Aortic Atherosclerosis (ICD10-I70.0). Coronary atherosclerosis. Mild cardiomegaly.    04/19/2018 PET scan    1. Interval mild mixed metabolic changes, as detailed. Persistent mildly hypermetabolic bilateral axillary, mediastinal and bilateral pelvic adenopathy and mildly  hypermetabolic right lower lobe pulmonary nodule compatible with lymphoproliferative disorder. Deauville 4 based on the subcarinal node. 2. Aortic Atherosclerosis (ICD10-I70.0).    06/29/2018 Procedure    Successful placement of a right internal jugular approach power injectable Port-A-Cath. The catheter is ready for immediate use.    07/26/2018 PET scan    1. Adenopathy primarily in the chest and pelvis as noted above, with size of the mildly enlarged lymph nodes stable to minimally increased, but with nodal activity generally decreased compared to the prior exam. Uptake in these nodes is primarily Deauville 2. 2. Aortic Atherosclerosis (ICD10-I70.0). Coronary atherosclerosis with mild cardiomegaly.     REVIEW OF SYSTEMS:   Constitutional: Denies fevers, chills or abnormal weight loss Eyes: Denies blurriness of vision Ears, nose, mouth, throat, and face: Denies mucositis or sore throat Respiratory: Denies cough, dyspnea or wheezes Cardiovascular: Denies palpitation, chest discomfort or lower extremity swelling Gastrointestinal:  Denies nausea, heartburn or change in bowel habits Skin: Denies abnormal skin rashes Lymphatics: Denies new lymphadenopathy or easy bruising Neurological:Denies numbness, tingling or new weaknesses Behavioral/Psych: Mood is stable, no new changes  All other systems were reviewed with the patient and are negative.  I have reviewed the past medical history, past surgical history, social history and family history with the patient and they are unchanged from previous note.  ALLERGIES:  has No Known Allergies.  MEDICATIONS:  Current Outpatient Medications  Medication Sig Dispense Refill  . apixaban (ELIQUIS) 5 MG TABS tablet Take 1 tablet (5 mg total) by mouth 2 (two) times daily. 180 tablet 3  . Cholecalciferol (VITAMIN D-3) 1000 units CAPS  Take 1,000 Units by mouth daily with breakfast.    . furosemide (LASIX) 40 MG tablet Take 1 tablet (40 mg total) by mouth  daily. PT OVERDUE FOR AN APPT AND WILL NEED TO SCHEDULE A FOLLOW UP APPT FOR FUTURE REFILLS 30 tablet 0  . lidocaine-prilocaine (EMLA) cream Apply 1 application topically as needed. 30 g 6  . metoprolol succinate (TOPROL-XL) 100 MG 24 hr tablet Take 1 1/2 tablets (150 mg total) daily. Take with or immediately following a meal. 135 tablet 3  . sacubitril-valsartan (ENTRESTO) 24-26 MG Take 1 tablet by mouth 2 (two) times daily. 180 tablet 3  . venetoclax (VENCLEXTA) 100 MG TABS Take 200 mg by mouth daily. 120 tablet 9   No current facility-administered medications for this visit.    Facility-Administered Medications Ordered in Other Visits  Medication Dose Route Frequency Provider Last Rate Last Dose  . sodium chloride flush (NS) 0.9 % injection 3 mL  3 mL Intravenous PRN Alvy Bimler, Der Gagliano, MD   10 mL at 04/09/18 1300    PHYSICAL EXAMINATION: ECOG PERFORMANCE STATUS: 1 - Symptomatic but completely ambulatory  Vitals:   10/04/18 0933  BP: 132/65  Pulse: (!) 53  Resp: 18  Temp: 97.8 F (36.6 C)  SpO2: 100%   Filed Weights   10/04/18 0933  Weight: 206 lb 9.6 oz (93.7 kg)    GENERAL:alert, no distress and comfortable SKIN: skin color, texture, turgor are normal, no rashes or significant lesions EYES: normal, Conjunctiva are pink and non-injected, sclera clear OROPHARYNX:no exudate, no erythema and lips, buccal mucosa, and tongue normal  NECK: supple, thyroid normal size, non-tender, without nodularity LYMPH:  no palpable lymphadenopathy in the cervical, axillary or inguinal LUNGS: clear to auscultation and percussion with normal breathing effort HEART: regular rate & rhythm and no murmurs and no lower extremity edema ABDOMEN:abdomen soft, non-tender and normal bowel sounds Musculoskeletal:no cyanosis of digits and no clubbing  NEURO: alert & oriented x 3 with fluent speech, no focal motor/sensory deficits  LABORATORY DATA:  I have reviewed the data as listed    Component Value  Date/Time   NA 144 09/06/2018 0851   NA 140 10/09/2017 1416   K 3.9 09/06/2018 0851   K 4.4 10/09/2017 1416   CL 109 09/06/2018 0851   CL 105 01/18/2013 0912   CO2 26 09/06/2018 0851   CO2 25 10/09/2017 1416   GLUCOSE 99 09/06/2018 0851   GLUCOSE 117 10/09/2017 1416   GLUCOSE 83 01/18/2013 0912   BUN 9 09/06/2018 0851   BUN 11.1 10/09/2017 1416   CREATININE 0.75 09/06/2018 0851   CREATININE 0.77 08/16/2018 1040   CREATININE 0.8 10/09/2017 1416   CALCIUM 9.7 09/06/2018 0851   CALCIUM 9.6 10/09/2017 1416   PROT 6.5 09/06/2018 0851   PROT 6.4 10/09/2017 1416   ALBUMIN 4.0 09/06/2018 0851   ALBUMIN 3.4 (L) 10/09/2017 1416   AST 26 09/06/2018 0851   AST 21 08/16/2018 1040   AST 18 10/09/2017 1416   ALT 22 09/06/2018 0851   ALT 21 08/16/2018 1040   ALT 19 10/09/2017 1416   ALKPHOS 91 09/06/2018 0851   ALKPHOS 69 10/09/2017 1416   BILITOT 1.0 09/06/2018 0851   BILITOT 0.9 08/16/2018 1040   BILITOT 0.57 10/09/2017 1416   GFRNONAA >60 09/06/2018 0851   GFRNONAA >60 08/16/2018 1040   GFRAA >60 09/06/2018 0851   GFRAA >60 08/16/2018 1040    No results found for: SPEP, UPEP  Lab Results  Component Value Date  WBC 3.7 (L) 10/04/2018   NEUTROABS 1.6 (L) 10/04/2018   HGB 11.4 (L) 10/04/2018   HCT 35.1 (L) 10/04/2018   MCV 87.5 10/04/2018   PLT 190 10/04/2018      Chemistry      Component Value Date/Time   NA 144 09/06/2018 0851   NA 140 10/09/2017 1416   K 3.9 09/06/2018 0851   K 4.4 10/09/2017 1416   CL 109 09/06/2018 0851   CL 105 01/18/2013 0912   CO2 26 09/06/2018 0851   CO2 25 10/09/2017 1416   BUN 9 09/06/2018 0851   BUN 11.1 10/09/2017 1416   CREATININE 0.75 09/06/2018 0851   CREATININE 0.77 08/16/2018 1040   CREATININE 0.8 10/09/2017 1416      Component Value Date/Time   CALCIUM 9.7 09/06/2018 0851   CALCIUM 9.6 10/09/2017 1416   ALKPHOS 91 09/06/2018 0851   ALKPHOS 69 10/09/2017 1416   AST 26 09/06/2018 0851   AST 21 08/16/2018 1040   AST 18  10/09/2017 1416   ALT 22 09/06/2018 0851   ALT 21 08/16/2018 1040   ALT 19 10/09/2017 1416   BILITOT 1.0 09/06/2018 0851   BILITOT 0.9 08/16/2018 1040   BILITOT 0.57 10/09/2017 1416       All questions were answered. The patient knows to call the clinic with any problems, questions or concerns. No barriers to learning was detected.  I spent 15 minutes counseling the patient face to face. The total time spent in the appointment was 20 minutes and more than 50% was on counseling and review of test results  Heath Lark, MD 10/04/2018 9:58 AM

## 2018-10-04 NOTE — Assessment & Plan Note (Signed)
She had acquired pancytopenia due to recent treatment but not symptomatic I recommend observation only. We discussed neutropenic precaution

## 2018-10-04 NOTE — Assessment & Plan Note (Signed)
Recent echocardiogram showed resolution of cardiomyopathy She will continue cardiac management

## 2018-10-04 NOTE — Assessment & Plan Note (Signed)
She tolerated venetoclax well Since discontinuation of rituximab, her ANC count has improved I plan to repeat imaging study next month With improvement of ANC, if needed, we can increase the dose of venetoclax to 400 mg

## 2018-10-04 NOTE — Telephone Encounter (Signed)
Gave avs and calendar ° °

## 2018-10-09 ENCOUNTER — Other Ambulatory Visit: Payer: Self-pay | Admitting: Cardiology

## 2018-10-09 ENCOUNTER — Other Ambulatory Visit: Payer: Self-pay | Admitting: Hematology and Oncology

## 2018-10-09 DIAGNOSIS — E876 Hypokalemia: Secondary | ICD-10-CM

## 2018-10-22 ENCOUNTER — Other Ambulatory Visit: Payer: Self-pay | Admitting: Cardiology

## 2018-10-25 ENCOUNTER — Encounter: Payer: Self-pay | Admitting: Family

## 2018-10-25 ENCOUNTER — Ambulatory Visit: Payer: BLUE CROSS/BLUE SHIELD | Admitting: Family

## 2018-10-25 VITALS — BP 134/82 | HR 63 | Temp 99.1°F | Ht 67.0 in | Wt 200.1 lb

## 2018-10-25 DIAGNOSIS — R6889 Other general symptoms and signs: Secondary | ICD-10-CM

## 2018-10-25 DIAGNOSIS — J209 Acute bronchitis, unspecified: Secondary | ICD-10-CM

## 2018-10-25 LAB — POC INFLUENZA A&B (BINAX/QUICKVUE)
Influenza A, POC: NEGATIVE
Influenza B, POC: NEGATIVE

## 2018-10-25 MED ORDER — AZITHROMYCIN 250 MG PO TABS: ORAL_TABLET | ORAL | 0 refills | Status: DC

## 2018-10-25 NOTE — Progress Notes (Signed)
Audrey Gross is a 58 y.o. female with the following history as recorded in EpicCare:  Patient Active Problem List   Diagnosis Date Noted  . Numbness and tingling in left hand 07/07/2018  . Pancytopenia, acquired ([place]) 06/05/2018  . Drug-induced neutropenia ([place]) 11/25/2017  . PICC (peripherally inserted central catheter) flush 10/30/2017  . PICC (peripherally inserted central catheter) in place 10/30/2017  . Closed wedge compression fracture of first lumbar vertebra ([place]) 10/25/2017  . Acute midline low back pain with sciatica 10/22/2017  . Back injury, initial encounter 10/22/2017  . Acute bilateral thoracic back pain 10/22/2017  . Port-A-Cath in place 10/16/2017  . Chronic systolic heart failure ([place]) 06/27/2017  . Goals of care, counseling/discussion 05/29/2017  . Nonischemic cardiomyopathy ([place]) 04/07/2017  . Chronic anticoagulation 03/23/2017  . Mitral regurgitation 02/28/2017  . Obesity 08/12/2016  . Hypertriglyceridemia without hypercholesterolemia 07/12/2016  . Screening for cervical cancer 06/11/2016  . PAF (paroxysmal atrial fibrillation) ([place]) 01/01/2016  . Abnormal mammogram of right breast 02/28/2014  . Dyslipidemia (high LDL; low HDL) 07/22/2012  . Osteopenia 06/21/2012  . Routine general medical examination at a health care facility 06/21/2012  . Hypertension 06/21/2012  . CLL (chronic lymphocytic leukemia) ([place]) 01/05/2012  . Iron deficiency anemia 01/05/2012  . Depression 01/05/2012  . 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 Cholecalciferol (VITAMIN D-3) 1000 units CAPS Take 1,000 Units by mouth daily with breakfast.    . ELIQUIS 5 MG TABS tablet TAKE 1 TABLET BY MOUTH TWICE A DAY 180 tablet 1  . furosemide (LASIX) 40 MG tablet Take 1 tablet (40 mg total) by mouth daily. PT OVERDUE FOR AN APPT AND WILL NEED TO SCHEDULE A FOLLOW UP APPT FOR FUTURE REFILLS  30 tablet 0  . lidocaine-prilocaine (EMLA) cream Apply 1 application topically as needed. 30 g 6  . metoprolol succinate (TOPROL-XL) 100 MG 24 hr tablet Take 1 1/2 tablets (150 mg total) daily. Take with or immediately following a meal. 135 tablet 3  . sacubitril-valsartan (ENTRESTO) 24-26 MG Take 1 tablet by mouth 2 (two) times daily. 180 tablet 3  . venetoclax (VENCLEXTA) 100 MG TABS Take 200 mg by mouth daily. 120 tablet 9  . azithromycin (ZITHROMAX) 250 MG tablet 2 tabs po qd x 1 day; 1 tablet per day x 4 days; 6 tablet 0   No current facility-administered medications for this visit.    Facility-Administered Medications Ordered in Other Visits  Medication Dose Route Frequency Provider Last Rate Last Dose  . sodium chloride flush (NS) 0.9 % injection 3 mL  3 mL Intravenous PRN Alvy Bimler, Ni, MD   10 mL at 04/09/18 1300    Allergies: Patient has no known allergies.  Past Medical History:  Diagnosis Date  . Atrial fibrillation ([place])   . CLL (chronic lymphoblastic leukemia)    Leukemia  . Cystic breast   . Depression   . Diffuse cystic mastopathy   . Dyslipidemia (high LDL; low HDL)   . Hypertension   . Iron deficiency anemia, unspecified   . Lymphadenopathy of head and neck 04/05/2015  . Medical non-compliance   . Mitral regurgitation 02/2017   moderate to severe  . Persistent atrial fibrillation with rapid ventricular response 07/15/2017    Past Surgical History:  Procedure Laterality Date  . CARDIOVERSION N/A 04/02/2017   Procedure: CARDIOVERSION;  Surgeon: Dorothy Spark, MD;  Location: [facility];  Service:  Cardiovascular;  Laterality: N/A;  . CARDIOVERSION N/A 08/19/2017   Procedure: CARDIOVERSION;  Surgeon: Larey Dresser, MD;  Location: [facility] ENDOSCOPY;  Service: Cardiovascular;  Laterality: N/A;  . IR IMAGING GUIDED PORT INSERTION  06/29/2018  . TUBAL LIGATION      Family History  Problem Relation Age of Onset  . Sudden death Mother   . Kidney disease Father         H/O HD and kidney transplant  . Stomach cancer Maternal Aunt   . Stomach cancer Paternal Grandmother   . Heart disease Brother   . Heart disease Maternal Grandmother     Social History   Tobacco Use  . Smoking status: Never Smoker  . Smokeless tobacco: Never Used  Substance Use Topics  . Alcohol use: No    Subjective:  Started with cold symptoms on Friday; by flu-like symptoms on Saturday; + cough, sore throat, diarrhea, fatigue; notes that fever seemed to break yesterday- more low grade today; has used some Nyquil yesterday and got increased rest over the weekend; no chest pain or shortness of breath; history of CLL/ CHF;    Objective:  Vitals:   10/25/18 1426  BP: 134/82  Pulse: 63  Temp: 99.1 F (37.3 C)  TempSrc: Oral  SpO2: 99%  Weight: 200 lb 1.9 oz (90.8 kg)  Height: 5\' 7"  (1.702 m)    General: Well developed, well nourished, in no acute distress  Skin : Warm and dry.  Head: Normocephalic and atraumatic  Eyes: Sclera and conjunctiva clear; pupils round and reactive to light; extraocular movements intact  Ears: External normal; canals clear; tympanic membranes normal  Oropharynx: Pink, supple. No suspicious lesions  Neck: Supple without thyromegaly, adenopathy  Lungs: Respirations unlabored; clear to auscultation bilaterally without wheeze, rales, rhonchi  CVS exam: normal rate and regular rhythm.  Neurologic: Alert and oriented; speech intact; face symmetrical; moves all extremities well; CNII-XII intact without focal deficit   Assessment:  1. Flu-like symptoms   2. Acute bronchitis, unspecified organism     Plan:  Rapid flu is negative; based on patient's clinical symptoms and underling CLL/ CHF, will go ahead and treat for secondary bacterial infection; Rx for Z-pak #1 take as directed; she will continue Dayquil and Nyquil for symptom relief; increase fluids, rest; encouraged patient to take tomorrow off work- she will call back if she decides to do this/ needs  work note.   No follow-ups on file.  Orders Placed This Encounter  Procedures  . POC Influenza A&B (Binax test)    Requested Prescriptions   Signed Prescriptions Disp Refills  . azithromycin (ZITHROMAX) 250 MG tablet 6 tablet 0    Sig: 2 tabs po qd x 1 day; 1 tablet per day x 4 days;

## 2018-10-26 ENCOUNTER — Telehealth: Payer: Self-pay

## 2018-10-26 NOTE — Telephone Encounter (Addendum)
Oral Oncology Patient Advocate Encounter  [facility] sent me a fax about doing a benefit investigation for the patient. I called [facility] and let them know that she has Pharmacist, community and they gave me copay card information to use at our pharmacy. The patient had previously been getting the venclexta through [employer] copay assistance program and mailing it to her house. We will now fill at [facility] with the copay card information listed below. This will make her out of pocket cost $5.  BIN: [id number] ID: [id number]  I called the patient to give her this information and arrange her next fill and had to leave a voicemail.  [place] Patient [place] Phone [phone] Fax [phone]

## 2018-10-27 ENCOUNTER — Telehealth: Payer: Self-pay | Admitting: Pharmacist

## 2018-10-27 DIAGNOSIS — C9110 Chronic lymphocytic leukemia of B-cell type not having achieved remission: Secondary | ICD-10-CM

## 2018-10-27 MED ORDER — VENETOCLAX 100 MG PO TABS: 200.0000 mg | ORAL_TABLET | Freq: Every day | ORAL | 3 refills | Status: DC

## 2018-10-27 NOTE — Telephone Encounter (Signed)
Oral Chemotherapy Pharmacist Encounter  Follow-Up Form  Spoke with patient today to follow up regarding patient's oral chemotherapy medication: Venclexta (venetoclax) for the treatment of chronic lymphocytic leukemia, in combination with rituximab, until disease progression or unacceptable toxicity  Last rituximab dose 08/16/2018, s/p 10 cycles, now on hold with progressive pancytopenia  Original Start date of oral chemotherapy: 09/24/2017  Pt reports 0 tablets/doses of Venclexta 100mg  tablets, 2 tablets (200mg ) taken by mouth once daily with food and water, missed in the last month.  Patient states she take her Venclexta at 8pm each evening with food, as this decreases nausea and vomiting that she used to experience more frequently when she took her dose at 9:30am.  Patient understands her dose of Venclexta may increase to 400mg  in the future if her disease shows signs of progression.  Pt reports the following side effects: pretty frequent diarrhea and nausea. She is able to manage these side effects with supportive care medications and is agreeable to continuing on Venclexta administration.  Pertinent labs reviewed: OK for continued treatment.  Other Issues:  Patient with questions about scheduling PET scan, as it is supposed to be performed prior to next office visit, which is scheduled for 11/02/2018.  PET scan order was placed on 10/04/18 by Dr. Alvy Bimler.  I left voicemail for collaborative practice RN to follow-up on scan scheduling.  We are changing patient from receiving her Venclexta from [employer] patient assistance program, to filling at [facility] with the use of a manufacturer copayment coupon. New prescription for Venclexta has been e-scribed to [facility]. Patient with ~1 month supply on hand. Oral oncology patient advocate is coordinating with patient to receive her next fill of [place] from [place] in mid-February.  Patient knows to call the  office with questions or concerns. Oral Oncology Clinic will continue to follow.  Johny Drilling, PharmD, BCPS, BCOP 10/27/2018 9:27 AM Oral Oncology Clinic [phone]

## 2018-10-28 ENCOUNTER — Telehealth: Payer: Self-pay | Admitting: Hematology and Oncology

## 2018-10-28 ENCOUNTER — Ambulatory Visit (HOSPITAL_COMMUNITY)
Admission: RE | Admit: 2018-10-28 | Discharge: 2018-10-28 | Disposition: A | Discharge status: Home / Self Care | Payer: BLUE CROSS/BLUE SHIELD | Source: Ambulatory Visit | Attending: Hematology and Oncology | Admitting: Hematology and Oncology

## 2018-10-28 DIAGNOSIS — C9110 Chronic lymphocytic leukemia of B-cell type not having achieved remission: Secondary | ICD-10-CM | POA: Diagnosis not present

## 2018-10-28 LAB — GLUCOSE, CAPILLARY: Glucose-Capillary: 100 mg/dL — ABNORMAL HIGH (ref 70–99)

## 2018-10-28 MED ORDER — FLUDEOXYGLUCOSE F - 18 (FDG) INJECTION
10.0300 | Freq: Once | INTRAVENOUS | Status: AC
  Administered 2018-10-28: 10.03 via INTRAVENOUS

## 2018-10-28 NOTE — Telephone Encounter (Signed)
Per 1/23 sch message - pt wants to keep appts as is - no reschedule.

## 2018-11-01 ENCOUNTER — Telehealth: Payer: Self-pay | Admitting: *Deleted

## 2018-11-01 ENCOUNTER — Inpatient Hospital Stay: Payer: BLUE CROSS/BLUE SHIELD | Attending: Hematology and Oncology

## 2018-11-01 ENCOUNTER — Inpatient Hospital Stay: Payer: BLUE CROSS/BLUE SHIELD

## 2018-11-01 DIAGNOSIS — C9110 Chronic lymphocytic leukemia of B-cell type not having achieved remission: Secondary | ICD-10-CM

## 2018-11-01 DIAGNOSIS — D61818 Other pancytopenia: Secondary | ICD-10-CM | POA: Insufficient documentation

## 2018-11-01 DIAGNOSIS — Z452 Encounter for adjustment and management of vascular access device: Secondary | ICD-10-CM

## 2018-11-01 DIAGNOSIS — E876 Hypokalemia: Secondary | ICD-10-CM | POA: Insufficient documentation

## 2018-11-01 DIAGNOSIS — I428 Other cardiomyopathies: Secondary | ICD-10-CM | POA: Diagnosis not present

## 2018-11-01 DIAGNOSIS — Z95828 Presence of other vascular implants and grafts: Secondary | ICD-10-CM

## 2018-11-01 DIAGNOSIS — Z79899 Other long term (current) drug therapy: Secondary | ICD-10-CM | POA: Diagnosis not present

## 2018-11-01 LAB — CBC WITH DIFFERENTIAL (CANCER CENTER ONLY)
Abs Immature Granulocytes: 0.01 10*3/uL (ref 0.00–0.07)
BASOS ABS: 0 10*3/uL (ref 0.0–0.1)
Basophils Relative: 0 %
Eosinophils Absolute: 0 10*3/uL (ref 0.0–0.5)
Eosinophils Relative: 0 %
HCT: 33.1 % — ABNORMAL LOW (ref 36.0–46.0)
Hemoglobin: 10.9 g/dL — ABNORMAL LOW (ref 12.0–15.0)
Immature Granulocytes: 0 %
Lymphocytes Relative: 47 %
Lymphs Abs: 1.4 10*3/uL (ref 0.7–4.0)
MCH: 28.8 pg (ref 26.0–34.0)
MCHC: 32.9 g/dL (ref 30.0–36.0)
MCV: 87.6 fL (ref 80.0–100.0)
Monocytes Absolute: 0.5 10*3/uL (ref 0.1–1.0)
Monocytes Relative: 16 %
NEUTROS ABS: 1.1 10*3/uL — AB (ref 1.7–7.7)
Neutrophils Relative %: 37 %
Platelet Count: 256 10*3/uL (ref 150–400)
RBC: 3.78 MIL/uL — ABNORMAL LOW (ref 3.87–5.11)
RDW: 13.4 % (ref 11.5–15.5)
WBC Count: 3.1 10*3/uL — ABNORMAL LOW (ref 4.0–10.5)
nRBC: 0 % (ref 0.0–0.2)

## 2018-11-01 LAB — CMP (CANCER CENTER ONLY)
ALT: 15 U/L (ref 0–44)
AST: 17 U/L (ref 15–41)
Albumin: 4 g/dL (ref 3.5–5.0)
Alkaline Phosphatase: 73 U/L (ref 38–126)
Anion gap: 11 (ref 5–15)
BILIRUBIN TOTAL: 1.3 mg/dL — AB (ref 0.3–1.2)
BUN: 11 mg/dL (ref 6–20)
CO2: 26 mmol/L (ref 22–32)
CREATININE: 0.72 mg/dL (ref 0.44–1.00)
Calcium: 9 mg/dL (ref 8.9–10.3)
Chloride: 108 mmol/L (ref 98–111)
GFR, Est AFR Am: >60 mL/min (ref >60)
GFR, Estimated: >60 mL/min (ref >60)
Glucose, Bld: 101 mg/dL — ABNORMAL HIGH (ref 70–99)
Potassium: 2.9 mmol/L — CL (ref 3.5–5.1)
Sodium: 145 mmol/L (ref 135–145)
Total Protein: 6.6 g/dL (ref 6.5–8.1)

## 2018-11-01 LAB — LACTATE DEHYDROGENASE: LDH: 275 U/L — ABNORMAL HIGH (ref 98–192)

## 2018-11-01 MED ORDER — SODIUM CHLORIDE 0.9% FLUSH
10.0000 mL | Freq: Once | INTRAVENOUS | Status: AC
  Administered 2018-11-01: 10 mL
  Filled 2018-11-01: qty 10

## 2018-11-01 MED ORDER — HEPARIN SOD (PORK) LOCK FLUSH 100 UNIT/ML IV SOLN
500.0000 [IU] | Freq: Once | INTRAVENOUS | Status: AC
  Administered 2018-11-01: 500 [IU]
  Filled 2018-11-01: qty 5

## 2018-11-01 NOTE — Telephone Encounter (Signed)
Received call from lab.  Pt's K+ is 2.9 today  Dr. Alvy Bimler is aware.

## 2018-11-02 ENCOUNTER — Encounter: Payer: Self-pay | Admitting: Hematology and Oncology

## 2018-11-02 ENCOUNTER — Telehealth: Payer: Self-pay | Admitting: Hematology and Oncology

## 2018-11-02 ENCOUNTER — Inpatient Hospital Stay (HOSPITAL_BASED_OUTPATIENT_CLINIC_OR_DEPARTMENT_OTHER): Payer: BLUE CROSS/BLUE SHIELD | Admitting: Hematology and Oncology

## 2018-11-02 VITALS — BP 129/63 | HR 63 | Temp 97.6°F | Resp 18 | Ht 67.0 in | Wt 200.2 lb

## 2018-11-02 DIAGNOSIS — D61818 Other pancytopenia: Secondary | ICD-10-CM | POA: Diagnosis not present

## 2018-11-02 DIAGNOSIS — E876 Hypokalemia: Secondary | ICD-10-CM | POA: Diagnosis not present

## 2018-11-02 DIAGNOSIS — Z79899 Other long term (current) drug therapy: Secondary | ICD-10-CM | POA: Diagnosis not present

## 2018-11-02 DIAGNOSIS — I428 Other cardiomyopathies: Secondary | ICD-10-CM | POA: Diagnosis not present

## 2018-11-02 DIAGNOSIS — C9110 Chronic lymphocytic leukemia of B-cell type not having achieved remission: Secondary | ICD-10-CM

## 2018-11-02 MED ORDER — POTASSIUM CHLORIDE CRYS ER 20 MEQ PO TBCR: 20.0000 meq | EXTENDED_RELEASE_TABLET | Freq: Every day | ORAL | 11 refills | Status: DC

## 2018-11-02 NOTE — Assessment & Plan Note (Signed)
She has recent exacerbation of hypokalemia due to furosemide and recent diarrhea I recommend potassium replacement therapy daily

## 2018-11-02 NOTE — Telephone Encounter (Signed)
Gave avs and calendar ° °

## 2018-11-02 NOTE — Assessment & Plan Note (Addendum)
I have reviewed recent blood work and imaging studies with the patient and her husband She has no evidence of disease progression although some of the lymphadenopathy showed mildly increased hypermetabolic activity I recommend reintroduction of monthly rituximab for several months before repeat imaging study to assess for response of therapy The risks, benefits, side effects of rituximab were discussed with the patient and her husband and they agreed to proceed Due to her pancytopenia, I do not feel strongly she can tolerate higher dose of venetoclax

## 2018-11-02 NOTE — Assessment & Plan Note (Signed)
She has no clinical signs or symptoms of congestive heart failure.  She will continue medical management

## 2018-11-02 NOTE — Progress Notes (Signed)
[facility] OFFICE PROGRESS NOTE  Patient Care Team: Janith Lima, MD as PCP - General (Internal Medicine) Constance Haw, MD as Consulting Physician (Cardiology)  ASSESSMENT & PLAN:  CLL (chronic lymphocytic leukemia) ([place]) I have reviewed recent blood work and imaging studies with the patient and her husband She has no evidence of disease progression although some of the lymphadenopathy showed mildly increased hypermetabolic activity I recommend reintroduction of monthly rituximab for several months before repeat imaging study to assess for response of therapy The risks, benefits, side effects of rituximab were discussed with the patient and her husband and they agreed to proceed Due to her pancytopenia, I do not feel strongly she can tolerate higher dose of venetoclax  Pancytopenia, acquired ([place]) She had acquired pancytopenia due to recent treatment but not symptomatic I recommend observation only. We discussed neutropenic precaution  Hypokalemia She has recent exacerbation of hypokalemia due to furosemide and recent diarrhea I recommend potassium replacement therapy daily  Nonischemic cardiomyopathy ([place]) She has no clinical signs or symptoms of congestive heart failure.  She will continue medical management   Orders Placed This Encounter  Procedures  . Uric acid    Standing Status:   Standing    Number of Occurrences:   2    Standing Expiration Date:   11/03/2019    INTERVAL HISTORY: Please see below for problem oriented charting. She returns for further follow-up with her husband She denies worsening lymphadenopathy or pain No recent exacerbation of congestive heart failure She had recent viral illness and diarrhea which has resolved.  She was prescribed a course of antibiotic therapy.  She has no further fever  SUMMARY OF ONCOLOGIC HISTORY:   CLL (chronic lymphocytic leukemia) ([place])   04/05/2015 Pathology Results    Accession: [id number] flow  cytometry confirmed CLL. FISH was positive for p53 mutation    04/24/2015 Imaging    Extensive lymphadenopathy throughout the neck, chest (axilla), abdomen and pelvis, as detailed above, compatible with the reported clinical history of lymphoma. 2. Mild splenomegaly.    05/03/2015 - 08/27/2015 Chemotherapy    She started on Ibrutinib, discontinued prematurely when her prescription ran out    10/10/2015 - 05/29/2017 Chemotherapy    She was restarted back on Ibrutinib    11/13/2016 PET scan    Significant generalized reduction in size of numerous lymph nodes in the neck, chest, abdomen, and pelvis. Previously the activity of these nodes was low-level and in general a similar low-level activity is present today, significantly less than mediastinal blood pool activity, compatible with Deauville 2. 2. Coronary atherosclerosis. Mild cardiomegaly. 3. Mildly prominent endometrium for age without accentuated metabolic activity in the endometrium. Consider pelvic sonography for further characterization.    05/27/2017 PET scan    1. Progressive hypermetabolic adenopathy, primarily involving cervical, axillary, pelvic and inguinal lymph nodes bilaterally, consistent with progressive lymphoma. 2. No solid visceral organ or osseous involvement.    06/16/2017 - 08/25/2017 Chemotherapy    The patient had 3 cycles of Rituximab and Bendamustine    07/15/2017 - 07/19/2017 Hospital Admission    The patient was briefly admitted to the hospital due to infusion reaction to rituximab    09/18/2017 PET scan    1. Continued considerable adenopathy in the neck, chest, abdomen, and pelvis. This is generally stable in size but mildly reduced in activity compared to the prior exam. Current levels of activity primarily Deauville 3 and Deauville 4. No splenomegaly. 2. Diffuse new ground-glass opacities in the  lungs with associated hypermetabolic activity. Some forms of lymphoma infiltration can rarely cause this pattern of  diffuse ground-glass opacity and hypermetabolic activity. Differential diagnostic considerations might include atypical pneumonia such as mycoplasma, acute hypersensitivity pneumonitis, or acute eosinophilic pneumonia. Pulmonary hemorrhage seems less likely to cause this degree of accentuated metabolic activity.  3. Aortic Atherosclerosis (ICD10-I70.0). Coronary atherosclerosis.    09/24/2017 -  Chemotherapy    The patient had ventoclax for chemotherapy treatment.  Rituximab is added on 11/18/17 to 04/09/18, x 6 cycles    11/19/2017 PET scan    Overall mild interval decrease in hypermetabolic lymphadenopathy throughout the neck, chest, abdomen, and pelvis. No new or increased lymphadenopathy identified.  While there has been resolution of diffuse hypermetabolic bilateral ground-glass pulmonary opacity since prior study, there is a new 14 mm hypermetabolic pulmonary nodule in the posterior right lower lobe. Time course favors inflammatory or infectious etiology over neoplasm. Recommend continued follow-up by chest CT in 3 months.    02/16/2018 PET scan    1. Adenopathy in the neck, chest, and pelvis is stable to minimally reduced in size, and is moderately reduced in activity, primarily Deauville 2 disease today. There is a right common iliac lymph node qualifying as Deauville 3 disease which is stable in size but reduced in activity. 2. Stable size but reduced activity in a pulmonary nodule in the right lower lobe. If this represents a leukemic lesion then a corresponds to Deauville 4 disease. This lesion was not readily apparent on 09/22/2017 but was reported on the prior chest CT of 11/19/2017. 3. Other imaging findings of potential clinical significance: Mild thyroid goiter. Aortic Atherosclerosis (ICD10-I70.0). Coronary atherosclerosis. Mild cardiomegaly.    04/19/2018 PET scan    1. Interval mild mixed metabolic changes, as detailed. Persistent mildly hypermetabolic bilateral axillary, mediastinal  and bilateral pelvic adenopathy and mildly hypermetabolic right lower lobe pulmonary nodule compatible with lymphoproliferative disorder. Deauville 4 based on the subcarinal node. 2. Aortic Atherosclerosis (ICD10-I70.0).    06/29/2018 Procedure    Successful placement of a right internal jugular approach power injectable Port-A-Cath. The catheter is ready for immediate use.    07/26/2018 PET scan    1. Adenopathy primarily in the chest and pelvis as noted above, with size of the mildly enlarged lymph nodes stable to minimally increased, but with nodal activity generally decreased compared to the prior exam. Uptake in these nodes is primarily Deauville 2. 2. Aortic Atherosclerosis (ICD10-I70.0). Coronary atherosclerosis with mild cardiomegaly.    10/29/2018 PET scan    1. Relatively stable sized lymphadenopathy but interval increase in hypermetabolism when compared to the most recent prior PET-CT, as detailed above. 2. No new disease is identified. 3. Stable bilateral pulmonary nodules.    11/12/2018 -  Chemotherapy    The patient had riTUXimab (RITUXAN) 800 mg in sodium chloride 0.9 % 170 mL infusion, 375 mg/m2 = 800 mg, Intravenous,  Once, 0 of 4 cycles  for chemotherapy treatment.      REVIEW OF SYSTEMS:   Constitutional: Denies fevers, chills or abnormal weight loss Eyes: Denies blurriness of vision Ears, nose, mouth, throat, and face: Denies mucositis or sore throat Respiratory: Denies cough, dyspnea or wheezes Cardiovascular: Denies palpitation, chest discomfort or lower extremity swelling Gastrointestinal:  Denies nausea, heartburn or change in bowel habits Skin: Denies abnormal skin rashes Lymphatics: Denies new lymphadenopathy or easy bruising Neurological:Denies numbness, tingling or new weaknesses Behavioral/Psych: Mood is stable, no new changes  All other systems were reviewed with the patient and  are negative.  I have reviewed the past medical history, past surgical  history, social history and family history with the patient and they are unchanged from previous note.  ALLERGIES:  has No Known Allergies.  MEDICATIONS:  Current Outpatient Medications  Medication Sig Dispense Refill  . Cholecalciferol (VITAMIN D-3) 1000 units CAPS Take 1,000 Units by mouth daily with breakfast.    . ELIQUIS 5 MG TABS tablet TAKE 1 TABLET BY MOUTH TWICE A DAY 180 tablet 1  . furosemide (LASIX) 40 MG tablet Take 1 tablet (40 mg total) by mouth daily. PT OVERDUE FOR AN APPT AND WILL NEED TO SCHEDULE A FOLLOW UP APPT FOR FUTURE REFILLS 30 tablet 0  . lidocaine-prilocaine (EMLA) cream Apply 1 application topically as needed. 30 g 6  . metoprolol succinate (TOPROL-XL) 100 MG 24 hr tablet Take 1 1/2 tablets (150 mg total) daily. Take with or immediately following a meal. 135 tablet 3  . potassium chloride SA (K-DUR,KLOR-CON) 20 MEQ tablet Take 1 tablet (20 mEq total) by mouth daily. 30 tablet 11  . sacubitril-valsartan (ENTRESTO) 24-26 MG Take 1 tablet by mouth 2 (two) times daily. 180 tablet 3  . venetoclax (VENCLEXTA) 100 MG TABS Take 200 mg by mouth daily. Take with food and water at approximately the same time each day. 60 tablet 3   No current facility-administered medications for this visit.     PHYSICAL EXAMINATION: ECOG PERFORMANCE STATUS: 1 - Symptomatic but completely ambulatory  Vitals:   11/02/18 0912  BP: 129/63  Pulse: 63  Resp: 18  Temp: 97.6 F (36.4 C)  SpO2: 100%   Filed Weights   11/02/18 0912  Weight: 200 lb 3.2 oz (90.8 kg)    GENERAL:alert, no distress and comfortable SKIN: skin color, texture, turgor are normal, no rashes or significant lesions EYES: normal, Conjunctiva are pink and non-injected, sclera clear OROPHARYNX:no exudate, no erythema and lips, buccal mucosa, and tongue normal  NECK: supple, thyroid normal size, non-tender, without nodularity LYMPH: She has persistent palpable lymphadenopathy, stable LUNGS: clear to auscultation  and percussion with normal breathing effort HEART: regular rate & rhythm and no murmurs and no lower extremity edema ABDOMEN:abdomen soft, non-tender and normal bowel sounds Musculoskeletal:no cyanosis of digits and no clubbing  NEURO: alert & oriented x 3 with fluent speech, no focal motor/sensory deficits  LABORATORY DATA:  I have reviewed the data as listed    Component Value Date/Time   NA 145 11/01/2018 0903   NA 140 10/09/2017 1416   K 2.9 (LL) 11/01/2018 0903   K 4.4 10/09/2017 1416   CL 108 11/01/2018 0903   CL 105 01/18/2013 0912   CO2 26 11/01/2018 0903   CO2 25 10/09/2017 1416   GLUCOSE 101 (H) 11/01/2018 0903   GLUCOSE 117 10/09/2017 1416   GLUCOSE 83 01/18/2013 0912   BUN 11 11/01/2018 0903   BUN 11.1 10/09/2017 1416   CREATININE 0.72 11/01/2018 0903   CREATININE 0.8 10/09/2017 1416   CALCIUM 9.0 11/01/2018 0903   CALCIUM 9.6 10/09/2017 1416   PROT 6.6 11/01/2018 0903   PROT 6.4 10/09/2017 1416   ALBUMIN 4.0 11/01/2018 0903   ALBUMIN 3.4 (L) 10/09/2017 1416   AST 17 11/01/2018 0903   AST 18 10/09/2017 1416   ALT 15 11/01/2018 0903   ALT 19 10/09/2017 1416   ALKPHOS 73 11/01/2018 0903   ALKPHOS 69 10/09/2017 1416   BILITOT 1.3 (H) 11/01/2018 0903   BILITOT 0.57 10/09/2017 1416   GFRNONAA >60 11/01/2018  5885   GFRAA >60 11/01/2018 0903    No results found for: SPEP, UPEP  Lab Results  Component Value Date   WBC 3.1 (L) 11/01/2018   NEUTROABS 1.1 (L) 11/01/2018   HGB 10.9 (L) 11/01/2018   HCT 33.1 (L) 11/01/2018   MCV 87.6 11/01/2018   PLT 256 11/01/2018      Chemistry      Component Value Date/Time   NA 145 11/01/2018 0903   NA 140 10/09/2017 1416   K 2.9 (LL) 11/01/2018 0903   K 4.4 10/09/2017 1416   CL 108 11/01/2018 0903   CL 105 01/18/2013 0912   CO2 26 11/01/2018 0903   CO2 25 10/09/2017 1416   BUN 11 11/01/2018 0903   BUN 11.1 10/09/2017 1416   CREATININE 0.72 11/01/2018 0903   CREATININE 0.8 10/09/2017 1416      Component Value  Date/Time   CALCIUM 9.0 11/01/2018 0903   CALCIUM 9.6 10/09/2017 1416   ALKPHOS 73 11/01/2018 0903   ALKPHOS 69 10/09/2017 1416   AST 17 11/01/2018 0903   AST 18 10/09/2017 1416   ALT 15 11/01/2018 0903   ALT 19 10/09/2017 1416   BILITOT 1.3 (H) 11/01/2018 0903   BILITOT 0.57 10/09/2017 1416       RADIOGRAPHIC STUDIES: I have reviewed multiple imaging studies with the patient and her husband I have personally reviewed the radiological images as listed and agreed with the findings in the report. Nm Pet Image Restag (ps) Skull Base To Thigh  Result Date: 10/29/2018 CLINICAL DATA:  Subsequent treatment strategy for chronic lymphocytic leukemia. EXAM: NUCLEAR MEDICINE PET SKULL BASE TO THIGH TECHNIQUE: 10.03 mCi F-18 FDG was injected intravenously. Full-ring PET imaging was performed from the skull base to thigh after the radiotracer. CT data was obtained and used for attenuation correction and anatomic localization. Fasting blood glucose: 100 mg/dl COMPARISON:  Multiple prior PET CTs from 2018 and 2019. The most recent is 07/26/2018 FINDINGS: Mediastinal blood pool activity: SUV max 2.54 NECK: Stable sized bilateral neck nodes. The largest nodal mass is on the right side and contains several lymph nodes. 11.5 mm node on image number 34 is stable. 10 mm node on image number 30 is stable. 9.5 mm node on image number 28 is stable. However, the SUV max has increased. It now measures 5.52 and was previously 1.75. 8.5 mm left supraclavicular node on image number 41 previously measured 7 mm. SUV max is 3.30 and was previously 1.68. Incidental CT findings: none CHEST: Bilateral axillary adenopathy persists. Right axillary/subpectoral lymph node on image number 54 measures 17.5 mm and previously measured 16.5 mm. SUV max is 3.68 and was previously 2.58 15.5 mm node on image number 58 previously measured 14.5 mm. SUV max is 3.36 and was previously 1.95. Left axillary/subpectoral lymph node on image number 52  measures 19.5 mm. This is stable. SUV max is 3.14 and was previously 2.36. Right-sided subcarinal lymph node on image number 68 measures 11 mm and is stable. SUV max is 6.60 and was previously 3.08. Stable bilateral pulmonary nodules. The largest lesion is in the right lower lobe and measures 15.5 mm. SUV max is 2.34 and was previously 2.72. Incidental CT findings: Stable mild cardiac enlargement and age advanced three-vessel coronary artery calcifications. ABDOMEN/PELVIS: Stable small scattered retroperitoneal lymph nodes. Right-sided common iliac node on image number 146 measures 12.5 mm and previously measured 12 mm. SUV max is 3.67 and was previously 2.37. Stable bilateral obturator lymphadenopathy. Right-sided node on  image number 163 measures 14 mm and left-sided node on the same image number measures 14 mm. SUV max on the right is 4.62 and was previously 2.43. The left-sided node SUV max is 4.60 and was previously 2.46 Small inguinal lymph nodes are stable. No splenomegaly or splenic lesions. No hepatic or adrenal gland lesions. Incidental CT findings: none SKELETON: No focal hypermetabolic activity to suggest skeletal metastasis. Incidental CT findings: none IMPRESSION: 1. Relatively stable sized lymphadenopathy but interval increase in hypermetabolism when compared to the most recent prior PET-CT, as detailed above. 2. No new disease is identified. 3. Stable bilateral pulmonary nodules. Electronically Signed   By: Marijo Sanes M.D.   On: 10/29/2018 10:05    All questions were answered. The patient knows to call the clinic with any problems, questions or concerns. No barriers to learning was detected.  I spent 25 minutes counseling the patient face to face. The total time spent in the appointment was 40 minutes and more than 50% was on counseling and review of test results  Heath Lark, MD 11/02/2018 9:39 AM

## 2018-11-02 NOTE — Assessment & Plan Note (Signed)
She had acquired pancytopenia due to recent treatment but not symptomatic I recommend observation only. We discussed neutropenic precaution

## 2018-11-05 ENCOUNTER — Ambulatory Visit: Payer: BLUE CROSS/BLUE SHIELD | Admitting: Cardiology

## 2018-11-05 ENCOUNTER — Encounter: Payer: Self-pay | Admitting: Cardiology

## 2018-11-05 VITALS — BP 126/72 | HR 51 | Ht 67.0 in | Wt 203.0 lb

## 2018-11-05 DIAGNOSIS — I4819 Other persistent atrial fibrillation: Secondary | ICD-10-CM

## 2018-11-05 MED ORDER — FUROSEMIDE 20 MG PO TABS: 20.0000 mg | ORAL_TABLET | Freq: Every day | ORAL | 1 refills | Status: DC | PRN

## 2018-11-05 MED ORDER — METOPROLOL SUCCINATE ER 100 MG PO TB24: ORAL_TABLET | ORAL | 2 refills | Status: DC

## 2018-11-05 NOTE — Progress Notes (Signed)
Electrophysiology Office Note   Date:  11/05/2018   ID:  Audrey Gross, DOB 12/26/1960, MRN [id number]  PCP:  Janith Lima, MD  Cardiologist:  [facility] Primary Electrophysiologist:  Dr Curt Bears    No chief complaint on file.    History of Present Illness: Audrey Gross is a 58 y.o. female who is being seen today for the evaluation of atrial fibrillaiton at the request of Janith Lima, MD. Presenting today for electrophysiology evaluation. She has a history of CLL, asymptomatic left ventricular dysfunction, and paroxysmal atrial fibrillation possibly related to a pulmonary vein trigger. She had a cardioversion for persistent atrial fibrillation on 04/02/17 with no clinical recurrence. Echocardiogram done September 2018 showed an EF of 30-35% consistent with an echo in May 2018. Left atrium is moderately dilated at 45 mm. On repeat echo 05/2018 her EF had normalized.   Today, denies symptoms of palpitations, chest pain, shortness of breath, orthopnea, PND, lower extremity edema, claudication, dizziness, presyncope, syncope, bleeding, or neurologic sequela. The patient is tolerating medications without difficulties. She continues her chemotherapy for her CLL. She has not noted any episodes of atrial fibrillation since her DCCV. She is in sinus today. Of note, her K+ was 2.9 on routine lab work and she was started on replacement therapy. Overall she reports that she feels better than her last visit.   Past Medical History:  Diagnosis Date  . Atrial fibrillation ([place])   . CLL (chronic lymphoblastic leukemia)    Leukemia  . Cystic breast   . Depression   . Diffuse cystic mastopathy   . Dyslipidemia (high LDL; low HDL)   . Hypertension   . Iron deficiency anemia, unspecified   . Lymphadenopathy of head and neck 04/05/2015  . Medical non-compliance   . Mitral regurgitation 02/2017   moderate to severe  . Persistent atrial fibrillation with rapid ventricular response 07/15/2017   Past  Surgical History:  Procedure Laterality Date  . CARDIOVERSION N/A 04/02/2017   Procedure: CARDIOVERSION;  Surgeon: Dorothy Spark, MD;  Location: [facility] ENDOSCOPY;  Service: Cardiovascular;  Laterality: N/A;  . CARDIOVERSION N/A 08/19/2017   Procedure: CARDIOVERSION;  Surgeon: Larey Dresser, MD;  Location: [facility];  Service: Cardiovascular;  Laterality: N/A;  . IR IMAGING GUIDED PORT INSERTION  06/29/2018  . TUBAL LIGATION       Current Outpatient Medications  Medication Sig Dispense Refill  . Cholecalciferol (VITAMIN D-3) 1000 units CAPS Take 1,000 Units by mouth daily with breakfast.    . ELIQUIS 5 MG TABS tablet TAKE 1 TABLET BY MOUTH TWICE A DAY 180 tablet 1  . lidocaine-prilocaine (EMLA) cream Apply 1 application topically as needed. 30 g 6  . metoprolol succinate (TOPROL-XL) 100 MG 24 hr tablet Take 1 1/2 tablets (150 mg total) daily. Take with or immediately following a meal. 135 tablet 2  . potassium chloride SA (K-DUR,KLOR-CON) 20 MEQ tablet Take 1 tablet (20 mEq total) by mouth daily. 30 tablet 11  . sacubitril-valsartan (ENTRESTO) 24-26 MG Take 1 tablet by mouth 2 (two) times daily. 180 tablet 3  . venetoclax (VENCLEXTA) 100 MG TABS Take 200 mg by mouth daily. Take with food and water at approximately the same time each day. 60 tablet 3  . furosemide (LASIX) 20 MG tablet Take 1 tablet (20 mg total) by mouth daily as needed. 30 tablet 1   No current facility-administered medications for this visit.     Allergies:   Patient has no known allergies.  Social History:  The patient  reports that she has never smoked. She has never used smokeless tobacco. She reports that she does not drink alcohol or use drugs.   Family History:  The patient's family history includes Heart disease in her brother and maternal grandmother; Kidney disease in her father; Stomach cancer in her maternal aunt and paternal grandmother; Sudden death in her mother.    ROS:  Please see the history of  present illness.   Otherwise, review of systems is positive for cough.   All other systems are reviewed and negative.   PHYSICAL EXAM: VS:  BP 126/72   Pulse (!) 51   Ht 5\' 7"  (1.702 m)   Wt 203 lb (92.1 kg)   BMI 31.79 kg/m  , BMI Body mass index is 31.79 kg/m. GEN: Well nourished, well developed, in no acute distress  HEENT: normal  Neck: no JVD, carotid bruits, or masses Cardiac: RRR; no murmurs, rubs, or gallops,no edema  Respiratory:  clear to auscultation bilaterally, normal work of breathing GI: soft, nontender, nondistended, + BS MS: no deformity or atrophy  Skin: warm and dry Neuro:  Strength and sensation are intact Psych: euthymic mood, full affect  EKG:  EKG is ordered today. Personal review of the ekg ordered shows sinus bradycardia HR 51, LVH with repol changes, PR 158, QRS 84, QTc 414  Recent Labs: 11/01/2018: ALT 15; BUN 11; Creatinine 0.72; Hemoglobin 10.9; Platelet Count 256; Potassium 2.9; Sodium 145    Lipid Panel     Component Value Date/Time   CHOL 170 07/16/2017 0103   TRIG 100 07/16/2017 0103   HDL 31 (L) 07/16/2017 0103   CHOLHDL 5.5 07/16/2017 0103   VLDL 20 07/16/2017 0103   LDLCALC 119 (H) 07/16/2017 0103   LDLDIRECT 144.5 07/26/2013 0934     Wt Readings from Last 3 Encounters:  11/05/18 203 lb (92.1 kg)  11/02/18 200 lb 3.2 oz (90.8 kg)  10/25/18 200 lb 1.9 oz (90.8 kg)      Other studies Reviewed: Additional studies/ records that were reviewed today include: TTE 05/12/18 Review of the above records today demonstrates:  - Left ventricle: The cavity size was normal. There was hypertrophy   of the apex. Systolic function was normal. The estimated ejection   fraction was in the range of 50% to 55%. Wall motion was normal;   there were no regional wall motion abnormalities. Features are   consistent with a pseudonormal left ventricular filling pattern,   with concomitant abnormal relaxation and increased filling   pressure (grade 2  diastolic dysfunction). - Aortic valve: There was no regurgitation. - Ascending aorta: The ascending aorta was mildly dilated. - Mitral valve: There was mild regurgitation. - Left atrium: The atrium was moderately dilated. - Atrial septum: No defect or patent foramen ovale was identified. - Tricuspid valve: There was trivial regurgitation. - Pulmonic valve: There was trivial regurgitation.   ASSESSMENT AND PLAN:  1.  Persistent atrial fibrillation: She is on Eliquis for anticoagulation. S/p DCCV 2018 and has done well since. Appears to be maintaining SR.  She has previously declined AAD and ablation.  This patients CHA2DS2-VASc Score and unadjusted Ischemic Stroke Rate (% per year) is equal to 3.2 % stroke rate/year from a score of 3  Above score calculated as 1 point each if present [CHF, HTN, DM, Vascular=MI/PAD/Aortic Plaque, Age if 65-74, or Female] Above score calculated as 2 points each if present [Age > 75, or Stroke/TIA/TE]  2. Essential hypertension:  Stable, no changes today  3. Chronic systolic heart failure: Ejection fraction 30-35% on echo 06/19/17. Now normalized on repeat echo 05/12/18. Continue on Entresto and Toprol She has not taken Lasix for a week and not had any edema. Deacon Gadbois change her to Lasix to 20 mg daily PRN given her low K+.  4. Hyperlipidemia: Managed by diet and exercise. Had recent lab work which is not available for review. Patient Audrey Gross bring in copy.  Current medicines are reviewed at length with the patient today.   The patient does not have concerns regarding her medicines.  The following changes were made today: decrease lasix.  Labs/ tests ordered today include:  Orders Placed This Encounter  Procedures  . EKG 12-Lead     Disposition:   FU with Kasidi Shanker 6 months  Johann Capers, [place]  11/05/2018 10:57 AM     [facility] [address] [place] [address] [phone] (office) [phone] (fax)   I  have seen and examined this patient with Adline Peals.  Agree with above, note added to reflect my findings.  On exam, RRR, no murmurs, lungs clear.  Feeling well today.  She showed no further signs of atrial fibrillation.  We Breella Vanostrand continue with no changes.  Kush Farabee M. Cleopatra Sardo MD 11/05/2018 11:28 AM

## 2018-11-05 NOTE — Patient Instructions (Signed)
Medication Instructions:  Your physician has recommended you make the following change in your medication:  1. CHANGE the way YOU TAKE LASIX -- take 20 mg once daily as needed for swelling.  * If you need a refill on your cardiac medications before your next appointment, please call your pharmacy.   Labwork: None ordered  Testing/Procedures: None ordered  Follow-Up: Your physician wants you to follow-up in: 6 months with Dr. Curt Bears.  You will receive a reminder letter in the mail two months in advance. If you don't receive a letter, please call our office to schedule the follow-up appointment.   Thank you for choosing [facility]!!   Trinidad Curet, RN [phone]

## 2018-11-12 ENCOUNTER — Inpatient Hospital Stay: Payer: BLUE CROSS/BLUE SHIELD | Attending: Hematology and Oncology

## 2018-11-12 ENCOUNTER — Inpatient Hospital Stay: Payer: BLUE CROSS/BLUE SHIELD

## 2018-11-12 VITALS — BP 155/83 | HR 52 | Temp 97.6°F | Resp 18

## 2018-11-12 DIAGNOSIS — E876 Hypokalemia: Secondary | ICD-10-CM | POA: Insufficient documentation

## 2018-11-12 DIAGNOSIS — Z5111 Encounter for antineoplastic chemotherapy: Secondary | ICD-10-CM | POA: Diagnosis not present

## 2018-11-12 DIAGNOSIS — D61818 Other pancytopenia: Secondary | ICD-10-CM | POA: Diagnosis not present

## 2018-11-12 DIAGNOSIS — C9110 Chronic lymphocytic leukemia of B-cell type not having achieved remission: Secondary | ICD-10-CM

## 2018-11-12 DIAGNOSIS — I428 Other cardiomyopathies: Secondary | ICD-10-CM | POA: Diagnosis not present

## 2018-11-12 DIAGNOSIS — Z95828 Presence of other vascular implants and grafts: Secondary | ICD-10-CM

## 2018-11-12 DIAGNOSIS — Z79899 Other long term (current) drug therapy: Secondary | ICD-10-CM | POA: Diagnosis not present

## 2018-11-12 DIAGNOSIS — Z452 Encounter for adjustment and management of vascular access device: Secondary | ICD-10-CM

## 2018-11-12 LAB — CBC WITH DIFFERENTIAL/PLATELET
Abs Immature Granulocytes: 0 10*3/uL (ref 0.00–0.07)
Basophils Absolute: 0 10*3/uL (ref 0.0–0.1)
Basophils Relative: 0 %
EOS ABS: 0 10*3/uL (ref 0.0–0.5)
Eosinophils Relative: 0 %
HCT: 33.6 % — ABNORMAL LOW (ref 36.0–46.0)
Hemoglobin: 10.9 g/dL — ABNORMAL LOW (ref 12.0–15.0)
Immature Granulocytes: 0 %
Lymphocytes Relative: 43 %
Lymphs Abs: 1.2 10*3/uL (ref 0.7–4.0)
MCH: 28.9 pg (ref 26.0–34.0)
MCHC: 32.4 g/dL (ref 30.0–36.0)
MCV: 89.1 fL (ref 80.0–100.0)
Monocytes Absolute: 0.4 10*3/uL (ref 0.1–1.0)
Monocytes Relative: 14 %
Neutro Abs: 1.2 10*3/uL — ABNORMAL LOW (ref 1.7–7.7)
Neutrophils Relative %: 43 %
Platelets: 191 10*3/uL (ref 150–400)
RBC: 3.77 MIL/uL — AB (ref 3.87–5.11)
RDW: 13.9 % (ref 11.5–15.5)
WBC: 2.8 10*3/uL — AB (ref 4.0–10.5)
nRBC: 0 % (ref 0.0–0.2)

## 2018-11-12 LAB — COMPREHENSIVE METABOLIC PANEL
ALT: 20 U/L (ref 0–44)
AST: 20 U/L (ref 15–41)
Albumin: 3.8 g/dL (ref 3.5–5.0)
Alkaline Phosphatase: 85 U/L (ref 38–126)
Anion gap: 10 (ref 5–15)
BUN: 8 mg/dL (ref 6–20)
CHLORIDE: 109 mmol/L (ref 98–111)
CO2: 25 mmol/L (ref 22–32)
Calcium: 8.8 mg/dL — ABNORMAL LOW (ref 8.9–10.3)
Creatinine, Ser: 0.76 mg/dL (ref 0.44–1.00)
GFR calc Af Amer: >60 mL/min (ref >60)
GFR calc non Af Amer: >60 mL/min (ref >60)
Glucose, Bld: 133 mg/dL — ABNORMAL HIGH (ref 70–99)
POTASSIUM: 3.7 mmol/L (ref 3.5–5.1)
Sodium: 144 mmol/L (ref 135–145)
Total Bilirubin: 1.2 mg/dL (ref 0.3–1.2)
Total Protein: 5.9 g/dL — ABNORMAL LOW (ref 6.5–8.1)

## 2018-11-12 LAB — LACTATE DEHYDROGENASE: LDH: 209 U/L — AB (ref 98–192)

## 2018-11-12 LAB — URIC ACID: Uric Acid, Serum: 7.7 mg/dL — ABNORMAL HIGH (ref 2.5–7.1)

## 2018-11-12 MED ORDER — HEPARIN SOD (PORK) LOCK FLUSH 100 UNIT/ML IV SOLN
250.0000 [IU] | Freq: Once | INTRAVENOUS | Status: DC
  Filled 2018-11-12: qty 5

## 2018-11-12 MED ORDER — ACETAMINOPHEN 325 MG PO TABS
650.0000 mg | ORAL_TABLET | Freq: Once | ORAL | Status: AC
  Administered 2018-11-12: 650 mg via ORAL

## 2018-11-12 MED ORDER — PEGFILGRASTIM 6 MG/0.6ML ~~LOC~~ PSKT
PREFILLED_SYRINGE | SUBCUTANEOUS | Status: AC
  Filled 2018-11-12: qty 0.6

## 2018-11-12 MED ORDER — SODIUM CHLORIDE 0.9 % IV SOLN
Freq: Once | INTRAVENOUS | Status: AC
  Administered 2018-11-12: 11:00:00 via INTRAVENOUS
  Filled 2018-11-12: qty 250

## 2018-11-12 MED ORDER — ACETAMINOPHEN 325 MG PO TABS
ORAL_TABLET | ORAL | Status: AC
  Filled 2018-11-12: qty 2

## 2018-11-12 MED ORDER — DIPHENHYDRAMINE HCL 25 MG PO CAPS
50.0000 mg | ORAL_CAPSULE | Freq: Once | ORAL | Status: AC
  Administered 2018-11-12: 50 mg via ORAL

## 2018-11-12 MED ORDER — SODIUM CHLORIDE 0.9 % IV SOLN
375.0000 mg/m2 | Freq: Once | INTRAVENOUS | Status: AC
  Administered 2018-11-12: 800 mg via INTRAVENOUS
  Filled 2018-11-12: qty 50

## 2018-11-12 MED ORDER — SODIUM CHLORIDE 0.9% FLUSH
10.0000 mL | Freq: Once | INTRAVENOUS | Status: AC
  Administered 2018-11-12: 10 mL
  Filled 2018-11-12: qty 10

## 2018-11-12 MED ORDER — DIPHENHYDRAMINE HCL 25 MG PO CAPS
ORAL_CAPSULE | ORAL | Status: AC
  Filled 2018-11-12: qty 2

## 2018-11-12 MED ORDER — SODIUM CHLORIDE 0.9% FLUSH
10.0000 mL | INTRAVENOUS | Status: DC | PRN
  Administered 2018-11-12: 10 mL
  Filled 2018-11-12: qty 10

## 2018-11-12 MED ORDER — HEPARIN SOD (PORK) LOCK FLUSH 100 UNIT/ML IV SOLN
500.0000 [IU] | Freq: Once | INTRAVENOUS | Status: AC | PRN
  Administered 2018-11-12: 500 [IU]
  Filled 2018-11-12: qty 5

## 2018-11-13 LAB — HEPATITIS B SURFACE ANTIGEN: Hepatitis B Surface Ag: NEGATIVE

## 2018-11-13 LAB — HEPATITIS B CORE ANTIBODY, IGM: Hep B C IgM: NEGATIVE

## 2018-11-16 MED FILL — VENCLEXTA 100 MG TABS: 100 | 30 days supply | Qty: 60 | Fill #0

## 2018-11-29 ENCOUNTER — Other Ambulatory Visit: Payer: Self-pay | Admitting: Cardiology

## 2018-12-10 ENCOUNTER — Inpatient Hospital Stay: Payer: BLUE CROSS/BLUE SHIELD

## 2018-12-10 ENCOUNTER — Other Ambulatory Visit: Payer: Self-pay

## 2018-12-10 ENCOUNTER — Inpatient Hospital Stay: Payer: BLUE CROSS/BLUE SHIELD | Attending: Hematology and Oncology

## 2018-12-10 ENCOUNTER — Other Ambulatory Visit: Payer: Self-pay | Admitting: Hematology and Oncology

## 2018-12-10 ENCOUNTER — Telehealth: Payer: Self-pay

## 2018-12-10 ENCOUNTER — Inpatient Hospital Stay (HOSPITAL_BASED_OUTPATIENT_CLINIC_OR_DEPARTMENT_OTHER): Payer: BLUE CROSS/BLUE SHIELD | Admitting: Hematology and Oncology

## 2018-12-10 ENCOUNTER — Telehealth: Payer: Self-pay | Admitting: Hematology and Oncology

## 2018-12-10 ENCOUNTER — Encounter: Payer: Self-pay | Admitting: Hematology and Oncology

## 2018-12-10 VITALS — BP 121/75 | HR 50 | Temp 97.8°F | Resp 16

## 2018-12-10 DIAGNOSIS — Z79899 Other long term (current) drug therapy: Secondary | ICD-10-CM | POA: Insufficient documentation

## 2018-12-10 DIAGNOSIS — R001 Bradycardia, unspecified: Secondary | ICD-10-CM | POA: Diagnosis not present

## 2018-12-10 DIAGNOSIS — T50905A Adverse effect of unspecified drugs, medicaments and biological substances, initial encounter: Secondary | ICD-10-CM

## 2018-12-10 DIAGNOSIS — C9110 Chronic lymphocytic leukemia of B-cell type not having achieved remission: Secondary | ICD-10-CM | POA: Diagnosis not present

## 2018-12-10 DIAGNOSIS — Z5112 Encounter for antineoplastic immunotherapy: Secondary | ICD-10-CM | POA: Insufficient documentation

## 2018-12-10 DIAGNOSIS — D701 Agranulocytosis secondary to cancer chemotherapy: Secondary | ICD-10-CM | POA: Diagnosis not present

## 2018-12-10 DIAGNOSIS — T451X5D Adverse effect of antineoplastic and immunosuppressive drugs, subsequent encounter: Secondary | ICD-10-CM | POA: Insufficient documentation

## 2018-12-10 DIAGNOSIS — Z7901 Long term (current) use of anticoagulants: Secondary | ICD-10-CM | POA: Diagnosis not present

## 2018-12-10 DIAGNOSIS — Z95828 Presence of other vascular implants and grafts: Secondary | ICD-10-CM

## 2018-12-10 DIAGNOSIS — D702 Other drug-induced agranulocytosis: Secondary | ICD-10-CM

## 2018-12-10 DIAGNOSIS — R319 Hematuria, unspecified: Secondary | ICD-10-CM | POA: Insufficient documentation

## 2018-12-10 DIAGNOSIS — Z452 Encounter for adjustment and management of vascular access device: Secondary | ICD-10-CM

## 2018-12-10 LAB — CBC WITH DIFFERENTIAL/PLATELET
Abs Immature Granulocytes: 0.01 10*3/uL (ref 0.00–0.07)
Basophils Absolute: 0 10*3/uL (ref 0.0–0.1)
Basophils Relative: 0 %
Eosinophils Absolute: 0 10*3/uL (ref 0.0–0.5)
Eosinophils Relative: 0 %
HCT: 37.2 % (ref 36.0–46.0)
HEMOGLOBIN: 11.9 g/dL — AB (ref 12.0–15.0)
Immature Granulocytes: 0 %
Lymphocytes Relative: 43 %
Lymphs Abs: 1.2 10*3/uL (ref 0.7–4.0)
MCH: 28.7 pg (ref 26.0–34.0)
MCHC: 32 g/dL (ref 30.0–36.0)
MCV: 89.6 fL (ref 80.0–100.0)
MONO ABS: 0.4 10*3/uL (ref 0.1–1.0)
Monocytes Relative: 15 %
Neutro Abs: 1.2 10*3/uL — ABNORMAL LOW (ref 1.7–7.7)
Neutrophils Relative %: 42 %
Platelets: 185 10*3/uL (ref 150–400)
RBC: 4.15 MIL/uL (ref 3.87–5.11)
RDW: 13.5 % (ref 11.5–15.5)
WBC: 2.9 10*3/uL — ABNORMAL LOW (ref 4.0–10.5)
nRBC: 0 % (ref 0.0–0.2)

## 2018-12-10 LAB — COMPREHENSIVE METABOLIC PANEL
ALT: 18 U/L (ref 0–44)
AST: 22 U/L (ref 15–41)
Albumin: 4.4 g/dL (ref 3.5–5.0)
Alkaline Phosphatase: 76 U/L (ref 38–126)
Anion gap: 6 (ref 5–15)
BUN: 10 mg/dL (ref 6–20)
CALCIUM: 9.5 mg/dL (ref 8.9–10.3)
CHLORIDE: 109 mmol/L (ref 98–111)
CO2: 26 mmol/L (ref 22–32)
Creatinine, Ser: 0.85 mg/dL (ref 0.44–1.00)
GFR calc Af Amer: >60 mL/min (ref >60)
GFR calc non Af Amer: >60 mL/min (ref >60)
Glucose, Bld: 113 mg/dL — ABNORMAL HIGH (ref 70–99)
Potassium: 4.1 mmol/L (ref 3.5–5.1)
Sodium: 141 mmol/L (ref 135–145)
Total Bilirubin: 1.3 mg/dL — ABNORMAL HIGH (ref 0.3–1.2)
Total Protein: 6.6 g/dL (ref 6.5–8.1)

## 2018-12-10 LAB — URIC ACID: Uric Acid, Serum: 7.6 mg/dL — ABNORMAL HIGH (ref 2.5–7.1)

## 2018-12-10 LAB — LACTATE DEHYDROGENASE: LDH: 206 U/L — ABNORMAL HIGH (ref 98–192)

## 2018-12-10 MED ORDER — SODIUM CHLORIDE 0.9 % IV SOLN
Freq: Once | INTRAVENOUS | Status: AC
  Administered 2018-12-10: 10:00:00 via INTRAVENOUS
  Filled 2018-12-10: qty 250

## 2018-12-10 MED ORDER — SODIUM CHLORIDE 0.9% FLUSH
10.0000 mL | Freq: Once | INTRAVENOUS | Status: AC
  Administered 2018-12-10: 10 mL
  Filled 2018-12-10: qty 10

## 2018-12-10 MED ORDER — SODIUM CHLORIDE 0.9% FLUSH
10.0000 mL | INTRAVENOUS | Status: DC | PRN
  Administered 2018-12-10: 10 mL
  Filled 2018-12-10: qty 10

## 2018-12-10 MED ORDER — ACETAMINOPHEN 325 MG PO TABS
ORAL_TABLET | ORAL | Status: AC
  Filled 2018-12-10: qty 2

## 2018-12-10 MED ORDER — DIPHENHYDRAMINE HCL 25 MG PO CAPS
ORAL_CAPSULE | ORAL | Status: AC
  Filled 2018-12-10: qty 2

## 2018-12-10 MED ORDER — ALLOPURINOL 300 MG PO TABS: 300.0000 mg | ORAL_TABLET | Freq: Every day | ORAL | 3 refills | Status: DC

## 2018-12-10 MED ORDER — SODIUM CHLORIDE 0.9 % IV SOLN
3.0000 mg | Freq: Once | INTRAVENOUS | Status: AC
  Administered 2018-12-10: 3 mg via INTRAVENOUS
  Filled 2018-12-10: qty 2

## 2018-12-10 MED ORDER — HEPARIN SOD (PORK) LOCK FLUSH 100 UNIT/ML IV SOLN
500.0000 [IU] | Freq: Once | INTRAVENOUS | Status: AC | PRN
  Administered 2018-12-10: 500 [IU]
  Filled 2018-12-10: qty 5

## 2018-12-10 MED ORDER — DIPHENHYDRAMINE HCL 25 MG PO CAPS
50.0000 mg | ORAL_CAPSULE | Freq: Once | ORAL | Status: AC
  Administered 2018-12-10: 50 mg via ORAL

## 2018-12-10 MED ORDER — ACETAMINOPHEN 325 MG PO TABS
650.0000 mg | ORAL_TABLET | Freq: Once | ORAL | Status: AC
  Administered 2018-12-10: 650 mg via ORAL

## 2018-12-10 MED ORDER — SODIUM CHLORIDE 0.9 % IV SOLN
375.0000 mg/m2 | Freq: Once | INTRAVENOUS | Status: AC
  Administered 2018-12-10: 800 mg via INTRAVENOUS
  Filled 2018-12-10: qty 30

## 2018-12-10 NOTE — Progress Notes (Signed)
[facility] OFFICE PROGRESS NOTE  Patient Care Team: Janith Lima, MD as PCP - General (Internal Medicine) Constance Haw, MD as PCP - Electrophysiology (Cardiology) Constance Haw, MD as Consulting Physician (Cardiology)  ASSESSMENT & PLAN:  CLL (chronic lymphocytic leukemia) ([place]) She tolerated rituximab well. Due to her pancytopenia, I do not feel strongly she can tolerate higher dose of venetoclax.  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 We discussed neutropenic precaution  Drug-induced bradycardia She complained of fatigue. Vital signs today showed bradycardia. She is on high-dose metoprolol I recommend her to contact her cardiologist for advice and possible dose adjustment   No orders of the defined types were placed in this encounter.   INTERVAL HISTORY: Please see below for problem oriented charting. She returns with her husband for further follow-up She denies recent exacerbation of congestive heart failure No recent infusion reaction She complained of fatigue She denies recent infection, fever or chills  SUMMARY OF ONCOLOGIC HISTORY:   CLL (chronic lymphocytic leukemia) ([place])   04/05/2015 Pathology Results    Accession: [id number] flow cytometry confirmed CLL. FISH was positive for p53 mutation    04/24/2015 Imaging    Extensive lymphadenopathy throughout the neck, chest (axilla), abdomen and pelvis, as detailed above, compatible with the reported clinical history of lymphoma. 2. Mild splenomegaly.    05/03/2015 - 08/27/2015 Chemotherapy    She started on Ibrutinib, discontinued prematurely when her prescription ran out    10/10/2015 - 05/29/2017 Chemotherapy    She was restarted back on Ibrutinib    11/13/2016 PET scan    Significant generalized reduction in size of numerous lymph nodes in the neck, chest, abdomen, and pelvis. Previously the activity of these nodes was low-level and in general a similar low-level activity is present today, significantly less than mediastinal blood pool activity, compatible with Deauville 2. 2. Coronary atherosclerosis. Mild cardiomegaly. 3. Mildly prominent endometrium for age without accentuated metabolic activity in the endometrium. Consider pelvic sonography for further characterization.    05/27/2017 PET scan    1. Progressive hypermetabolic adenopathy, primarily involving cervical, axillary, pelvic and inguinal lymph nodes bilaterally, consistent with progressive lymphoma. 2. No solid visceral organ or osseous involvement.    06/16/2017 - 08/25/2017 Chemotherapy    The patient had 3 cycles of Rituximab and Bendamustine    07/15/2017 - 07/19/2017 Hospital Admission    The patient was briefly admitted to the hospital due to infusion reaction to rituximab    09/18/2017 PET scan    1. Continued considerable adenopathy in the neck, chest, abdomen, and pelvis. This is generally stable in size but mildly reduced in activity compared to the prior exam. Current levels of activity primarily Deauville 3 and Deauville 4. No splenomegaly. 2. Diffuse new ground-glass opacities in the lungs with associated hypermetabolic activity. Some forms of lymphoma infiltration can rarely cause this pattern of diffuse ground-glass opacity and hypermetabolic activity. Differential diagnostic considerations might include atypical pneumonia such as mycoplasma, acute hypersensitivity pneumonitis, or acute eosinophilic pneumonia. Pulmonary hemorrhage seems less likely to cause this degree of accentuated metabolic activity.  3. Aortic Atherosclerosis (ICD10-I70.0). Coronary atherosclerosis.    09/24/2017 -  Chemotherapy    The patient had ventoclax for chemotherapy treatment.  Rituximab  is added on 11/18/17 to 04/09/18,  x 6 cycles    11/19/2017 PET scan    Overall mild interval decrease in hypermetabolic lymphadenopathy throughout the neck, chest, abdomen, and pelvis. No new or increased lymphadenopathy identified.  While there has been resolution of diffuse hypermetabolic bilateral ground-glass pulmonary opacity since prior study, there is a new 14 mm hypermetabolic pulmonary nodule in the posterior right lower lobe. Time course favors inflammatory or infectious etiology over neoplasm. Recommend continued follow-up by chest CT in 3 months.    02/16/2018 PET scan    1. Adenopathy in the neck, chest, and pelvis is stable to minimally reduced in size, and is moderately reduced in activity, primarily Deauville 2 disease today. There is a right common iliac lymph node qualifying as Deauville 3 disease which is stable in size but reduced in activity. 2. Stable size but reduced activity in a pulmonary nodule in the right lower lobe. If this represents a leukemic lesion then a corresponds to Deauville 4 disease. This lesion was not readily apparent on 09/22/2017 but was reported on the prior chest CT of 11/19/2017. 3. Other imaging findings of potential clinical significance: Mild thyroid goiter. Aortic Atherosclerosis (ICD10-I70.0). Coronary atherosclerosis. Mild cardiomegaly.    04/19/2018 PET scan    1. Interval mild mixed metabolic changes, as detailed. Persistent mildly hypermetabolic bilateral axillary, mediastinal and bilateral pelvic adenopathy and mildly hypermetabolic right lower lobe pulmonary nodule compatible with lymphoproliferative disorder. Deauville 4 based on the subcarinal node. 2. Aortic Atherosclerosis (ICD10-I70.0).    06/29/2018 Procedure    Successful placement of a right internal jugular approach power injectable Port-A-Cath. The catheter is ready for immediate use.    07/26/2018 PET scan    1. Adenopathy primarily in the chest and pelvis as noted above, with size of  the mildly enlarged lymph nodes stable to minimally increased, but with nodal activity generally decreased compared to the prior exam. Uptake in these nodes is primarily Deauville 2. 2. Aortic Atherosclerosis (ICD10-I70.0). Coronary atherosclerosis with mild cardiomegaly.    10/29/2018 PET scan    1. Relatively stable sized lymphadenopathy but interval increase in hypermetabolism when compared to the most recent prior PET-CT, as detailed above. 2. No new disease is identified. 3. Stable bilateral pulmonary nodules.    11/12/2018 -  Chemotherapy    The patient had riTUXimab (RITUXAN) 800 mg in sodium chloride 0.9 % 170 mL infusion, 375 mg/m2 = 800 mg, Intravenous,  Once, 1 of 4 cycles Administration: 800 mg (11/12/2018)  for chemotherapy treatment.      REVIEW OF SYSTEMS:   Constitutional: Denies fevers, chills or abnormal weight loss Eyes: Denies blurriness of vision Ears, nose, mouth, throat, and face: Denies mucositis or sore throat Respiratory: Denies cough, dyspnea or wheezes Cardiovascular: Denies palpitation, chest discomfort or lower extremity swelling Gastrointestinal:  Denies nausea, heartburn or change in bowel habits Skin: Denies abnormal skin rashes Lymphatics: Denies new lymphadenopathy or easy bruising Neurological:Denies numbness, tingling or new weaknesses Behavioral/Psych: Mood is stable, no new changes  All other systems were reviewed with the patient and are negative.  I have reviewed the past medical history, past surgical history, social history and family history with the patient and they are unchanged from previous note.  ALLERGIES:  has No Known Allergies.  MEDICATIONS:  Current Outpatient Medications  Medication Sig Dispense Refill  . Cholecalciferol (VITAMIN D-3) 1000 units CAPS Take 1,000 Units by mouth daily with breakfast.    . ELIQUIS 5 MG TABS tablet TAKE 1 TABLET BY MOUTH TWICE A DAY 180 tablet  1  . furosemide (LASIX) 20 MG tablet TAKE 1 TABLET BY MOUTH  EVERY DAY AS NEEDED 90 tablet 2  . lidocaine-prilocaine (EMLA) cream Apply 1 application topically as needed. 30 g 6  . metoprolol succinate (TOPROL-XL) 100 MG 24 hr tablet Take 1 1/2 tablets (150 mg total) daily. Take with or immediately following a meal. 135 tablet 2  . potassium chloride SA (K-DUR,KLOR-CON) 20 MEQ tablet Take 1 tablet (20 mEq total) by mouth daily. 30 tablet 11  . sacubitril-valsartan (ENTRESTO) 24-26 MG Take 1 tablet by mouth 2 (two) times daily. 180 tablet 3  . venetoclax (VENCLEXTA) 100 MG TABS Take 200 mg by mouth daily. Take with food and water at approximately the same time each day. 60 tablet 3   No current facility-administered medications for this visit.     PHYSICAL EXAMINATION: ECOG PERFORMANCE STATUS: 1 - Symptomatic but completely ambulatory  Vitals:   12/10/18 0924  BP: 134/75  Pulse: (!) 52  Resp: 18  Temp: 98 F (36.7 C)  SpO2: 100%   Filed Weights   12/10/18 0924  Weight: 201 lb 6.4 oz (91.4 kg)    GENERAL:alert, no distress and comfortable SKIN: skin color, texture, turgor are normal, no rashes or significant lesions EYES: normal, Conjunctiva are pink and non-injected, sclera clear OROPHARYNX:no exudate, no erythema and lips, buccal mucosa, and tongue normal  NECK: supple, thyroid normal size, non-tender, without nodularity LYMPH: She has mild palpable lymphadenopathy in the right axilla, stable  lUNGS: clear to auscultation and percussion with normal breathing effort HEART: regular rate & rhythm and no murmurs and no lower extremity edema ABDOMEN:abdomen soft, non-tender and normal bowel sounds Musculoskeletal:no cyanosis of digits and no clubbing  NEURO: alert & oriented x 3 with fluent speech, no focal motor/sensory deficits  LABORATORY DATA:  I have reviewed the data as listed    Component Value Date/Time   NA 144 11/12/2018 0954   NA 140 10/09/2017 1416   K 3.7 11/12/2018 0954   K 4.4 10/09/2017 1416   CL 109 11/12/2018 0954    CL 105 01/18/2013 0912   CO2 25 11/12/2018 0954   CO2 25 10/09/2017 1416   GLUCOSE 133 (H) 11/12/2018 0954   GLUCOSE 117 10/09/2017 1416   GLUCOSE 83 01/18/2013 0912   BUN 8 11/12/2018 0954   BUN 11.1 10/09/2017 1416   CREATININE 0.76 11/12/2018 0954   CREATININE 0.72 11/01/2018 0903   CREATININE 0.8 10/09/2017 1416   CALCIUM 8.8 (L) 11/12/2018 0954   CALCIUM 9.6 10/09/2017 1416   PROT 5.9 (L) 11/12/2018 0954   PROT 6.4 10/09/2017 1416   ALBUMIN 3.8 11/12/2018 0954   ALBUMIN 3.4 (L) 10/09/2017 1416   AST 20 11/12/2018 0954   AST 17 11/01/2018 0903   AST 18 10/09/2017 1416   ALT 20 11/12/2018 0954   ALT 15 11/01/2018 0903   ALT 19 10/09/2017 1416   ALKPHOS 85 11/12/2018 0954   ALKPHOS 69 10/09/2017 1416   BILITOT 1.2 11/12/2018 0954   BILITOT 1.3 (H) 11/01/2018 0903   BILITOT 0.57 10/09/2017 1416   GFRNONAA >60 11/12/2018 0954   GFRNONAA >60 11/01/2018 0903   GFRAA >60 11/12/2018 0954   GFRAA >60 11/01/2018 0903    No results found for: SPEP, UPEP  Lab Results  Component Value Date   WBC 2.9 (L) 12/10/2018   NEUTROABS 1.2 (L) 12/10/2018   HGB 11.9 (L) 12/10/2018   HCT 37.2 12/10/2018   MCV 89.6 12/10/2018   PLT 185  12/10/2018      Chemistry      Component Value Date/Time   NA 144 11/12/2018 0954   NA 140 10/09/2017 1416   K 3.7 11/12/2018 0954   K 4.4 10/09/2017 1416   CL 109 11/12/2018 0954   CL 105 01/18/2013 0912   CO2 25 11/12/2018 0954   CO2 25 10/09/2017 1416   BUN 8 11/12/2018 0954   BUN 11.1 10/09/2017 1416   CREATININE 0.76 11/12/2018 0954   CREATININE 0.72 11/01/2018 0903   CREATININE 0.8 10/09/2017 1416      Component Value Date/Time   CALCIUM 8.8 (L) 11/12/2018 0954   CALCIUM 9.6 10/09/2017 1416   ALKPHOS 85 11/12/2018 0954   ALKPHOS 69 10/09/2017 1416   AST 20 11/12/2018 0954   AST 17 11/01/2018 0903   AST 18 10/09/2017 1416   ALT 20 11/12/2018 0954   ALT 15 11/01/2018 0903   ALT 19 10/09/2017 1416   BILITOT 1.2 11/12/2018 0954    BILITOT 1.3 (H) 11/01/2018 0903   BILITOT 0.57 10/09/2017 1416      All questions were answered. The patient knows to call the clinic with any problems, questions or concerns. No barriers to learning was detected.  I spent 15 minutes counseling the patient face to face. The total time spent in the appointment was 20 minutes and more than 50% was on counseling and review of test results  Heath Lark, MD 12/10/2018 9:41 AM

## 2018-12-10 NOTE — Assessment & Plan Note (Signed)
She tolerated rituximab well. Due to her pancytopenia, I do not feel strongly she can tolerate higher dose of venetoclax.  She will continue the same along with rituximab We will proceed with treatment as scheduled She will be due to repeat imaging study in May

## 2018-12-10 NOTE — Telephone Encounter (Signed)
Gave avs and calendar ° °

## 2018-12-10 NOTE — Patient Instructions (Addendum)
Rasburicase Injection What is this medicine? RASBURICASE (ras BURE i kase) breaks down uric acid in the blood. It is used to prevent and to treat high levels of uric acid caused by cancer treatment. This medicine may be used for other purposes; ask your health care provider or pharmacist if you have questions. COMMON BRAND NAME(S): Elitek What should I tell my health care provider before I take this medicine? They need to know if you have any of these conditions: -G6PD deficiency -history of anemia -history of blood transfusion -an unusual or allergic reaction to rasburicase, yeast, mannitol, other medicines, foods, dyes, or preservatives -pregnant or trying to get pregnant -breast-feeding How should I use this medicine? This medicine is for infusion into a vein. It is given by a health care professional in a hospital or clinic setting. Talk to your pediatrician regarding the use of this medicine in children. While this drug may be prescribed for children as young as 18 month old for selected conditions, precautions do apply. Overdosage: If you think you have taken too much of this medicine contact a poison control center or emergency room at once. NOTE: This medicine is only for you. Do not share this medicine with others. What if I miss a dose? This does not apply. What may interact with this medicine? Interactions have not been studied. Give your health care provider a list of all the medicines, herbs, non-prescription drugs, or dietary supplements you use. Also tell them if you smoke, drink alcohol, or use illegal drugs. Some items may interact with your medicine. This list may not describe all possible interactions. Give your health care provider a list of all the medicines, herbs, non-prescription drugs, or dietary supplements you use. Also tell them if you smoke, drink alcohol, or use illegal drugs. Some items may interact with your medicine. What should I watch for while using this  medicine? Your condition will be monitored carefully while you are receiving this medicine. You will need to have regular blood tests during your treatment. What side effects may I notice from receiving this medicine? Side effects that you should report to your doctor or health care professional as soon as possible: -allergic reactions like skin rash, itching or hives, swelling of the face, lips, or tongue -blue color to lips or nailbeds -breathing problems -chest pain, tightness -confusion -fast, irregular heartbeat -feeling faint or lightheaded, falls -low blood pressure -seizures -unusually weak or tired -yellowing of the eyes or skin Side effects that usually do not require medical attention (report to your doctor or health care professional if they continue or are bothersome): -abdominal pain -constipation -diarrhea -fever -headache -nausea, vomiting -swelling of the ankles, feet, hands This list may not describe all possible side effects. Call your doctor for medical advice about side effects. You may report side effects to FDA at 1-800-FDA-1088. Where should I keep my medicine? This drug is given in a hospital or clinic and will not be stored at home. NOTE: This sheet is a summary. It may not cover all possible information. If you have questions about this medicine, talk to your doctor, pharmacist, or health care provider.  2019 Elsevier/Gold Standard (2014-03-17 13:24:23)  Rituximab; Hyaluronidase injection What is this medicine? RITUXIMAB; HYALURONIDASE (ri TUX i mab / hye al ur ON i dase) is used to treat non-Hodgkin lymphoma and chronic lymphocytic leukemia. Rituximab is a monoclonal antibody. Hyaluronidase is used to improve the effects of rituximab. This medicine may be used for other purposes; ask your health  care provider or pharmacist if you have questions. COMMON BRAND NAME(S): Rituxan Hycela What should I tell my health care provider before I take this  medicine? They need to know if you have any of these conditions: -heart disease -infection (especially a virus infection such as hepatitis B, chickenpox, cold sores, or herpes) -immune system problems -irregular heartbeat -kidney disease -lung or breathing disease, like asthma -recently received or scheduled to receive a vaccine -an unusual or allergic reaction to rituximab, rituximab/hyaluronidase, mouse proteins, other medicines, foods, dyes, or preservatives -pregnant or trying to get pregnant -breast-feeding How should I use this medicine? This medicine is for injection under the skin. It is given by a health care professional in a hospital or clinic setting. A special MedGuide will be given to you before each treatment. Be sure to read this information carefully each time. Talk to your pediatrician regarding the use of this medicine in children. Special care may be needed. Overdosage: If you think you have taken too much of this medicine contact a poison control center or emergency room at once. NOTE: This medicine is only for you. Do not share this medicine with others. What if I miss a dose? It is important not to miss your dose. Call your doctor or health care professional if you are unable to keep an appointment. What may interact with this medicine? This medicine may interact with the following medications: -cisplatin -live virus vaccines This list may not describe all possible interactions. Give your health care provider a list of all the medicines, herbs, non-prescription drugs, or dietary supplements you use. Also tell them if you smoke, drink alcohol, or use illegal drugs. Some items may interact with your medicine. What should I watch for while using this medicine? Your condition will be monitored carefully while you are receiving this medicine. You may need blood work done while you are taking this medicine. This medicine can cause serious allergic reactions. To reduce your  risk you may need to take medicine before treatment with this medicine. Take your medicine as directed. In some patients, this medicine may cause a serious brain infection that may cause death. If you have any problems seeing, thinking, speaking, walking, or standing, tell your doctor right away. If you cannot reach your doctor, urgently seek other source of medical care. Call your doctor or health care professional for advice if you get a fever, chills or sore throat, or other symptoms of a cold or flu. Do not treat yourself. This drug decreases your body's ability to fight infections. Try to avoid being around people who are sick. Do not become pregnant while taking this medicine or for 12 months after stopping it. Women should inform their doctor if they wish to become pregnant or think they might be pregnant. There is a potential for serious side effects to an unborn child. Talk to your health care professional or pharmacist for more information. Do not breast-feed an infant while taking this medicine or for at least 6 months after stopping it. What side effects may I notice from receiving this medicine? Side effects that you should report to your doctor or health care professional as soon as possible: -allergic reactions like skin rash, itching or hives; swelling of the face, lips, or tongue -breathing problems -chest pain -changes in vision -diarrhea -dizziness -headache with fever, neck stiffness, sensitivity to light, nausea, or confusion -fast, irregular heartbeat -loss of memory -low blood counts - this medicine may decrease the number of white blood cells,  red blood cells and platelets. You may be at increased risk for infections and bleeding. -mouth sores -problems with balance, talking, or walking -redness, blistering, peeling or loosening of the skin, including inside the mouth -signs of infection - fever or chills, cough, sore throat, pain or difficulty passing urine -signs and  symptoms of kidney injury like trouble passing urine or change in the amount of urine -signs and symptoms of liver injury like dark yellow or brown urine; general ill feeling or flu-like symptoms; light-colored stools; loss of appetite; nausea; right upper belly pain; unusually weak or tired; yellowing of the eyes or skin -stomach pain -swelling of the ankles, feet, hands -unusual bleeding or bruising -vomiting Side effects that usually do not require medical attention (report these to your doctor or health care professional if they continue or are bothersome): -headache -joint pain -muscle cramps or muscle pain -nausea -pain, redness, or irritation at site where injected -tiredness This list may not describe all possible side effects. Call your doctor for medical advice about side effects. You may report side effects to FDA at 1-800-FDA-1088. Where should I keep my medicine? This drug is given in a hospital or clinic and will not be stored at home. NOTE: This sheet is a summary. It may not cover all possible information. If you have questions about this medicine, talk to your doctor, pharmacist, or health care provider.  2019 Elsevier/Gold Standard (2017-09-04 13:12:25)

## 2018-12-10 NOTE — Assessment & Plan Note (Signed)
She had acquired pancytopenia due to recent treatment but not symptomatic I recommend observation only. We discussed neutropenic precaution

## 2018-12-10 NOTE — Telephone Encounter (Signed)
Spoke with pt in the infusion room to explain need for Rasburicase and Allopurinol.  Pt instructed to start Allopurinol today.

## 2018-12-10 NOTE — Assessment & Plan Note (Signed)
She complained of fatigue. Vital signs today showed bradycardia. She is on high-dose metoprolol I recommend her to contact her cardiologist for advice and possible dose adjustment

## 2018-12-13 ENCOUNTER — Other Ambulatory Visit: Payer: Self-pay

## 2018-12-13 DIAGNOSIS — R319 Hematuria, unspecified: Secondary | ICD-10-CM

## 2018-12-14 ENCOUNTER — Inpatient Hospital Stay: Payer: BLUE CROSS/BLUE SHIELD

## 2018-12-14 DIAGNOSIS — R319 Hematuria, unspecified: Secondary | ICD-10-CM | POA: Diagnosis not present

## 2018-12-14 DIAGNOSIS — Z5112 Encounter for antineoplastic immunotherapy: Secondary | ICD-10-CM | POA: Diagnosis not present

## 2018-12-14 DIAGNOSIS — T451X5D Adverse effect of antineoplastic and immunosuppressive drugs, subsequent encounter: Secondary | ICD-10-CM | POA: Diagnosis not present

## 2018-12-14 DIAGNOSIS — R001 Bradycardia, unspecified: Secondary | ICD-10-CM | POA: Diagnosis not present

## 2018-12-14 DIAGNOSIS — D701 Agranulocytosis secondary to cancer chemotherapy: Secondary | ICD-10-CM | POA: Diagnosis not present

## 2018-12-14 DIAGNOSIS — Z7901 Long term (current) use of anticoagulants: Secondary | ICD-10-CM | POA: Diagnosis not present

## 2018-12-14 DIAGNOSIS — Z79899 Other long term (current) drug therapy: Secondary | ICD-10-CM | POA: Diagnosis not present

## 2018-12-14 DIAGNOSIS — C9110 Chronic lymphocytic leukemia of B-cell type not having achieved remission: Secondary | ICD-10-CM | POA: Diagnosis not present

## 2018-12-14 LAB — URINALYSIS, COMPLETE (UACMP) WITH MICROSCOPIC
Bilirubin Urine: NEGATIVE
Glucose, UA: NEGATIVE mg/dL
Hgb urine dipstick: NEGATIVE
KETONES UR: NEGATIVE mg/dL
Nitrite: NEGATIVE
Protein, ur: NEGATIVE mg/dL
Specific Gravity, Urine: 1.021 (ref 1.005–1.030)
pH: 5 (ref 5.0–8.0)

## 2018-12-14 IMAGING — RF DG ESOPHAGUS
13 series · 13 of 24 positions shown · non-contrast
Comparison: PET-CT 05/27/2017.

CLINICAL DATA: Dysphagia.  Lymphoma.

EXAM:
ESOPHOGRAM / BARIUM SWALLOW / BARIUM TABLET STUDY
TECHNIQUE: Combined double contrast and single contrast examination performed
using effervescent crystals, thick barium liquid, and thin barium
liquid. The patient was observed with fluoroscopy swallowing a 13 mm
barium sulphate tablet.
FLUOROSCOPY TIME:  Fluoroscopy Time:  2.3 minutes
Radiation Exposure Index (if provided by the fluoroscopic device):
167.5 mGy
Number of Acquired Spot Images: 0

[Series 1: fluoro_barium 2fps_bw · 0.18mm/px · 1 of 13 frames shown (1 of 7)]
[frame 2/13]
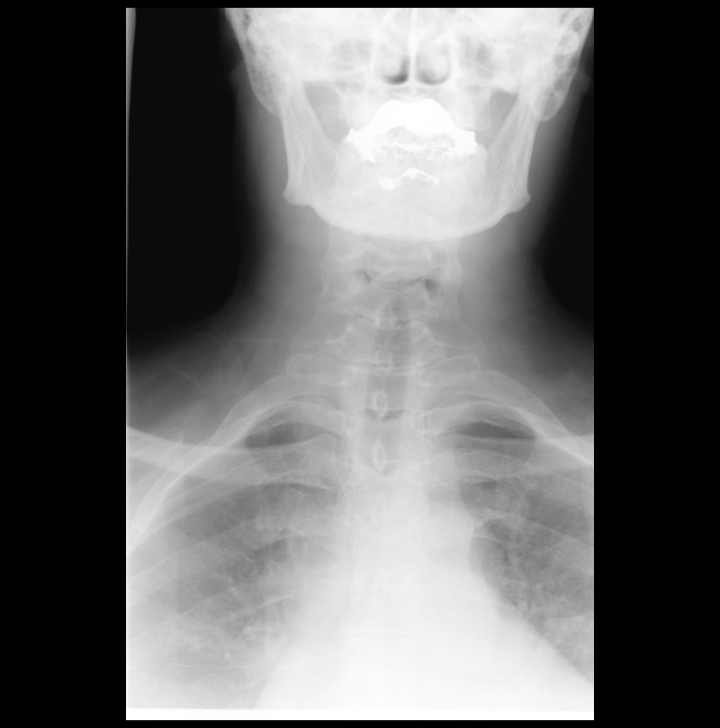

[Series 2: fluoro_barium 2fps_bw · 0.18mm/px · 1 of 23 frames shown (2 of 7)]
[frame 4/23]
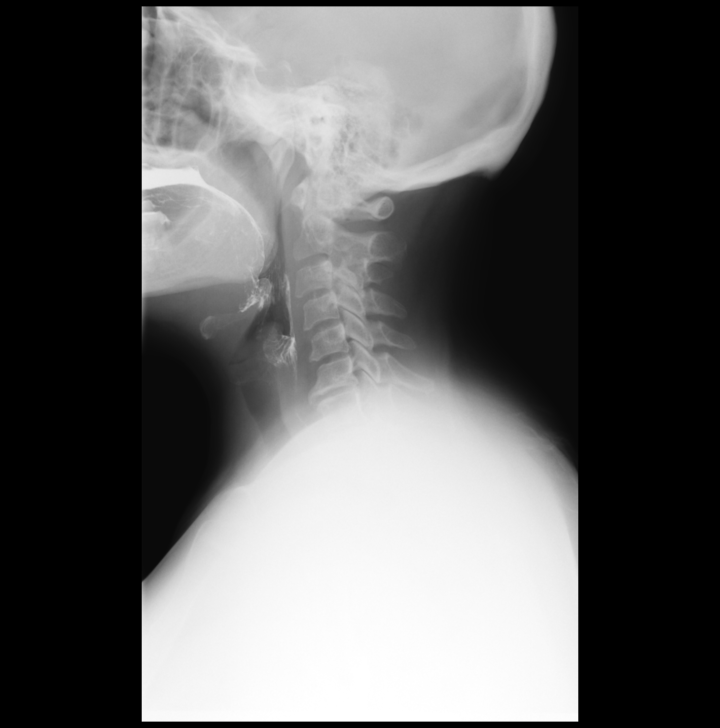

[Series 3: fluoro_barium 2fps_bw · 0.18mm/px · 1 of 40 frames shown (3 of 7)]
[frame 21/40]
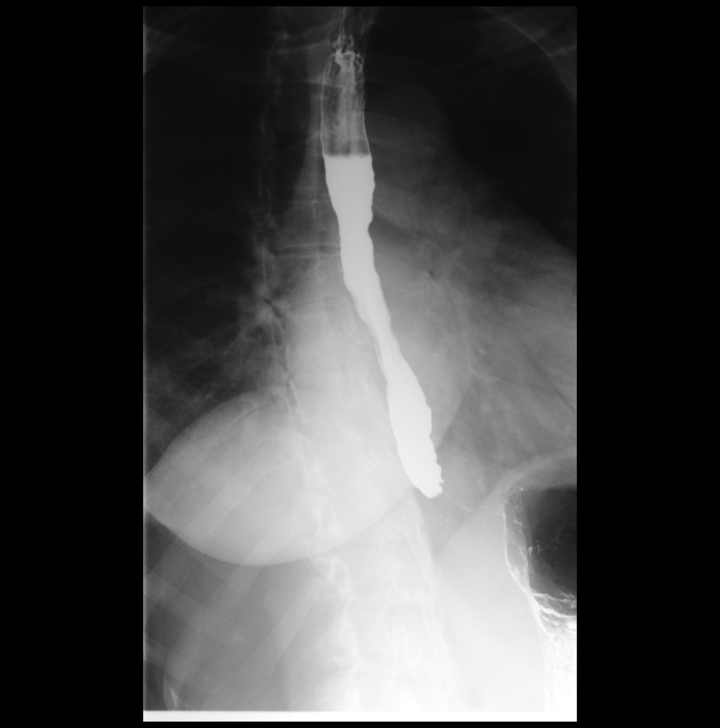

[Series 4: fluoro_barium 2fps_bw · 0.18mm/px · 1 of 17 frames shown (4 of 7)]
[frame 3/17]
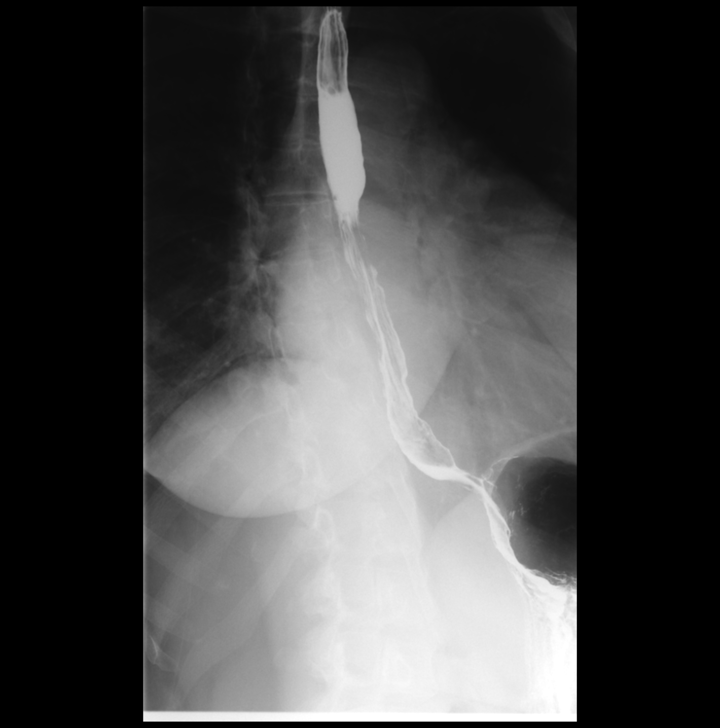

[Series 5: fluoro_barium 2fps_bw · 0.18mm/px · 1 of 20 frames shown (5 of 7)]
[frame 4/20]
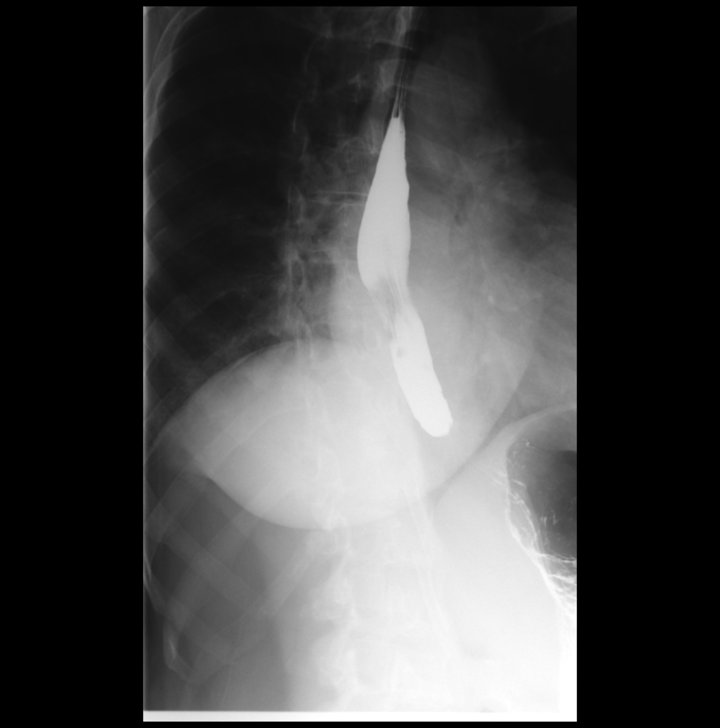

[Series 6: fluoro_barium 2fps_bw · 0.18mm/px · 1 of 8 frames shown (6 of 7)]
[frame 2/8]
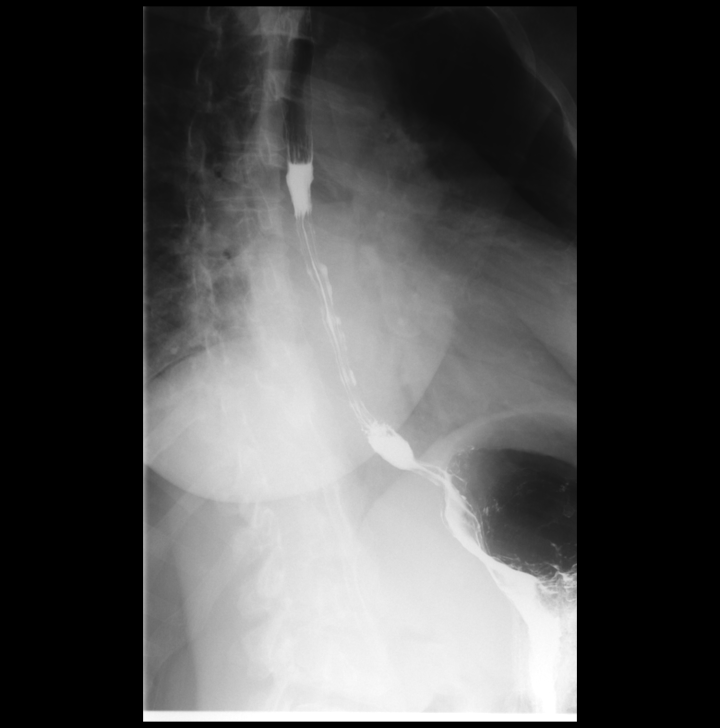

[Series 7: fluoro_barium 2fps_bw · 0.18mm/px · 1 of 7 frames shown (7 of 7)]
[frame 4/7]
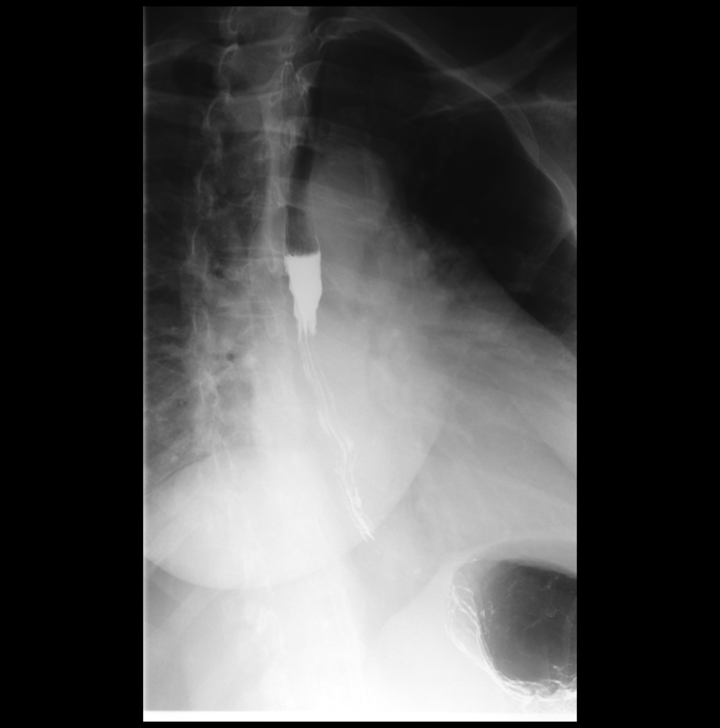

[Series 8: cp_standard · 0.54mm/px · 1 of 27 frames shown (1 of 6)]
[frame 3/27]
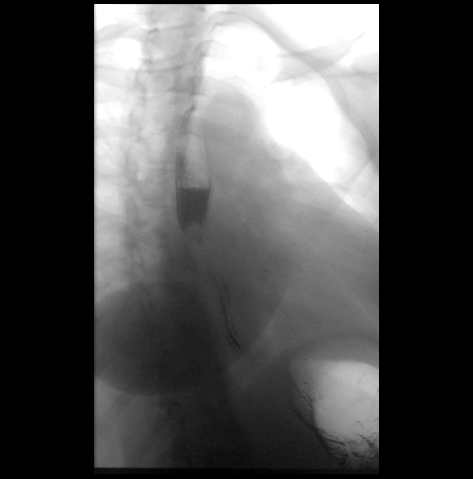

[Series 9: cp_standard · 0.56mm/px · 1 of 34 frames shown (2 of 6)]
[frame 6/34]
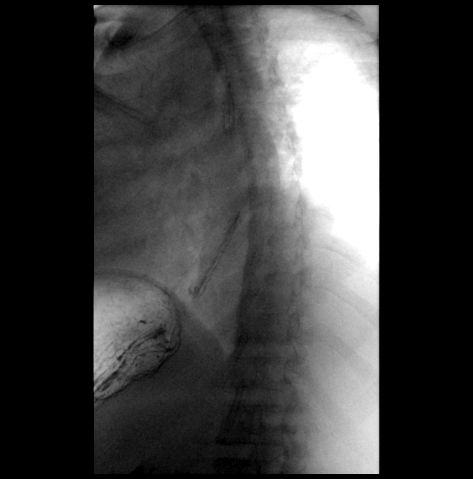

[Series 10: cp_standard · 0.56mm/px · 1 of 38 frames shown (3 of 6)]
[frame 6/38]
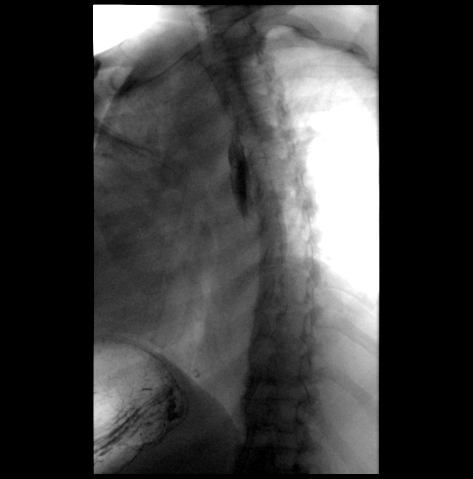

[Series 11: cp_standard · 0.56mm/px · 1 of 21 frames shown (4 of 6)]
[frame 4/21]
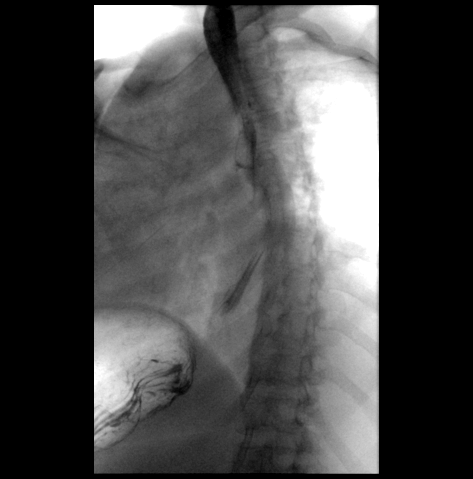

[Series 12: cp_standard · 0.56mm/px · 1 of 85 frames shown (5 of 6)]
[frame 43/85]
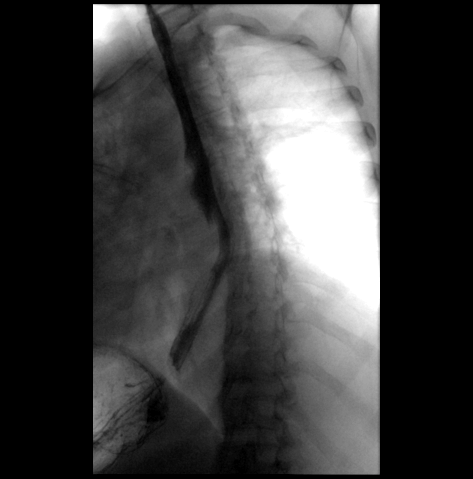

[Series 14: cp_standard · 0.27mm/px · 1 of 1 slices shown (6 of 6)]
[im 1/1]
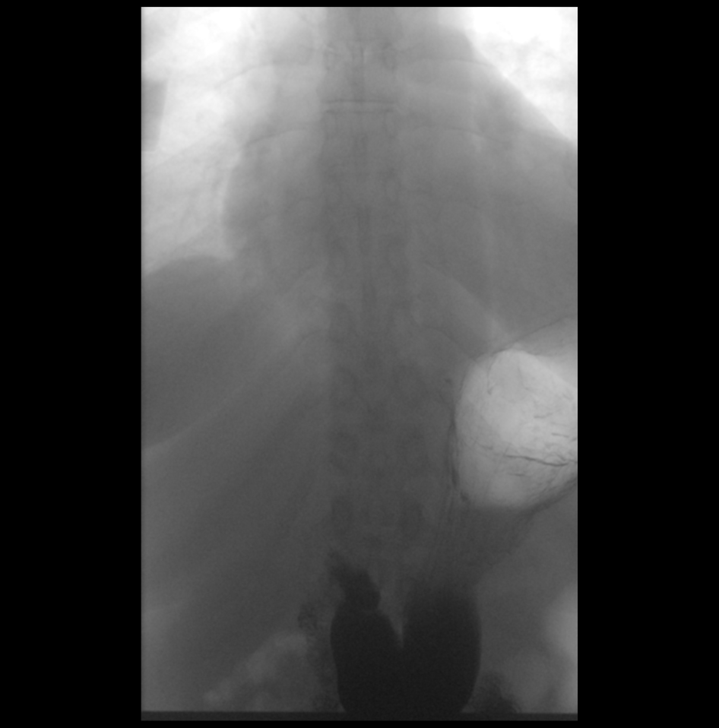

[13 of 24 positions shown; findings below may reference images not displayed]

FINDINGS: Frontal and lateral views of the hypopharynx while swallowing are
unremarkable.

Double contrast imaging of the esophagus limited by patient
difficulty and rapidly consuming effervescent crystals and barium.
No gross esophageal diverticulum or focal mucosal ulceration is
evident. There is some mass-effect on the distal esophagus likely
related to cardiomegaly has no central mediastinal lymphadenopathy
or mass lesion is evident on the PET-CT from 4 weeks ago.

Esophageal motility is normal.

13 mm barium tablet passes readily into the stomach when taken with
water.

No evidence for hiatal hernia.
IMPRESSION: Unremarkable double contrast barium esophagram with some limitation
due to patient inability to rapidly consume effervescent crystals
and barium.

## 2018-12-15 LAB — URINE CULTURE

## 2018-12-16 ENCOUNTER — Telehealth: Payer: Self-pay

## 2018-12-16 NOTE — Telephone Encounter (Signed)
Oral Oncology Patient Advocate Encounter  [place] has attempted to contact the patient 3 times to get her Venclexta filled with no success. I called her today and had to leave a voicemail.  [place] Patient [place] Phone [phone] Fax [phone] 12/16/2018   3:47 PM

## 2018-12-17 ENCOUNTER — Telehealth: Payer: Self-pay

## 2018-12-17 NOTE — Telephone Encounter (Signed)
Left vm for pt to return call  

## 2018-12-21 NOTE — Telephone Encounter (Signed)
Oral Oncology Patient Advocate Encounter  I attempted to reach the patient again today with no success. I was able to leave a voicemail on her cell number.  [place] Patient [place] Phone [phone] Fax [phone] 12/21/2018   11:42 AM

## 2018-12-22 MED FILL — VENCLEXTA 100 MG TABS: 100 | 30 days supply | Qty: 60 | Fill #1

## 2018-12-22 NOTE — Telephone Encounter (Signed)
Oral Oncology Patient Advocate Encounter  The patient called me back and we arranged for her Venclexta to be shipped to her today.  [place] Patient [place] Phone [phone] Fax [phone] 12/22/2018   2:24 PM

## 2019-01-06 ENCOUNTER — Inpatient Hospital Stay: Payer: BLUE CROSS/BLUE SHIELD

## 2019-01-06 ENCOUNTER — Inpatient Hospital Stay (HOSPITAL_BASED_OUTPATIENT_CLINIC_OR_DEPARTMENT_OTHER): Payer: BLUE CROSS/BLUE SHIELD | Admitting: Hematology and Oncology

## 2019-01-06 ENCOUNTER — Other Ambulatory Visit: Payer: Self-pay

## 2019-01-06 ENCOUNTER — Encounter: Payer: Self-pay | Admitting: Hematology and Oncology

## 2019-01-06 ENCOUNTER — Inpatient Hospital Stay: Payer: BLUE CROSS/BLUE SHIELD | Attending: Hematology and Oncology

## 2019-01-06 VITALS — BP 128/89 | HR 50 | Resp 18

## 2019-01-06 VITALS — BP 158/76 | HR 54 | Temp 97.4°F | Resp 18 | Ht 67.0 in | Wt 200.2 lb

## 2019-01-06 DIAGNOSIS — Z95828 Presence of other vascular implants and grafts: Secondary | ICD-10-CM

## 2019-01-06 DIAGNOSIS — Z79899 Other long term (current) drug therapy: Secondary | ICD-10-CM

## 2019-01-06 DIAGNOSIS — I482 Chronic atrial fibrillation, unspecified: Secondary | ICD-10-CM | POA: Insufficient documentation

## 2019-01-06 DIAGNOSIS — Z5112 Encounter for antineoplastic immunotherapy: Secondary | ICD-10-CM | POA: Insufficient documentation

## 2019-01-06 DIAGNOSIS — I10 Essential (primary) hypertension: Secondary | ICD-10-CM

## 2019-01-06 DIAGNOSIS — D702 Other drug-induced agranulocytosis: Secondary | ICD-10-CM | POA: Diagnosis not present

## 2019-01-06 DIAGNOSIS — R918 Other nonspecific abnormal finding of lung field: Secondary | ICD-10-CM | POA: Insufficient documentation

## 2019-01-06 DIAGNOSIS — C9110 Chronic lymphocytic leukemia of B-cell type not having achieved remission: Secondary | ICD-10-CM

## 2019-01-06 DIAGNOSIS — I429 Cardiomyopathy, unspecified: Secondary | ICD-10-CM | POA: Diagnosis not present

## 2019-01-06 DIAGNOSIS — Z452 Encounter for adjustment and management of vascular access device: Secondary | ICD-10-CM

## 2019-01-06 DIAGNOSIS — Z9221 Personal history of antineoplastic chemotherapy: Secondary | ICD-10-CM | POA: Insufficient documentation

## 2019-01-06 LAB — CBC WITH DIFFERENTIAL/PLATELET
Abs Immature Granulocytes: 0.01 10*3/uL (ref 0.00–0.07)
Basophils Absolute: 0 10*3/uL (ref 0.0–0.1)
Basophils Relative: 1 %
Eosinophils Absolute: 0 10*3/uL (ref 0.0–0.5)
Eosinophils Relative: 1 %
HCT: 34.2 % — ABNORMAL LOW (ref 36.0–46.0)
Hemoglobin: 11.1 g/dL — ABNORMAL LOW (ref 12.0–15.0)
Immature Granulocytes: 1 %
Lymphocytes Relative: 47 %
Lymphs Abs: 1.1 10*3/uL (ref 0.7–4.0)
MCH: 29.9 pg (ref 26.0–34.0)
MCHC: 32.5 g/dL (ref 30.0–36.0)
MCV: 92.2 fL (ref 80.0–100.0)
Monocytes Absolute: 0.3 10*3/uL (ref 0.1–1.0)
Monocytes Relative: 16 %
Neutro Abs: 0.7 10*3/uL — ABNORMAL LOW (ref 1.7–7.7)
Neutrophils Relative %: 34 %
Platelets: 150 10*3/uL (ref 150–400)
RBC: 3.71 MIL/uL — ABNORMAL LOW (ref 3.87–5.11)
RDW: 13.8 % (ref 11.5–15.5)
WBC: 2.2 10*3/uL — ABNORMAL LOW (ref 4.0–10.5)
nRBC: 0 % (ref 0.0–0.2)

## 2019-01-06 LAB — COMPREHENSIVE METABOLIC PANEL
ALT: 19 U/L (ref 0–44)
AST: 21 U/L (ref 15–41)
Albumin: 4.1 g/dL (ref 3.5–5.0)
Alkaline Phosphatase: 75 U/L (ref 38–126)
Anion gap: 9 (ref 5–15)
BUN: 13 mg/dL (ref 6–20)
CO2: 25 mmol/L (ref 22–32)
Calcium: 9 mg/dL (ref 8.9–10.3)
Chloride: 109 mmol/L (ref 98–111)
Creatinine, Ser: 0.76 mg/dL (ref 0.44–1.00)
GFR calc Af Amer: >60 mL/min (ref >60)
GFR calc non Af Amer: >60 mL/min (ref >60)
Glucose, Bld: 111 mg/dL — ABNORMAL HIGH (ref 70–99)
Potassium: 4 mmol/L (ref 3.5–5.1)
Sodium: 143 mmol/L (ref 135–145)
Total Bilirubin: 0.8 mg/dL (ref 0.3–1.2)
Total Protein: 6.3 g/dL — ABNORMAL LOW (ref 6.5–8.1)

## 2019-01-06 LAB — LACTATE DEHYDROGENASE: LDH: 367 U/L — ABNORMAL HIGH (ref 98–192)

## 2019-01-06 MED ORDER — SODIUM CHLORIDE 0.9 % IV SOLN
Freq: Once | INTRAVENOUS | Status: AC
  Administered 2019-01-06: 11:00:00 via INTRAVENOUS
  Filled 2019-01-06: qty 250

## 2019-01-06 MED ORDER — DIPHENHYDRAMINE HCL 25 MG PO CAPS
ORAL_CAPSULE | ORAL | Status: AC
  Filled 2019-01-06: qty 2

## 2019-01-06 MED ORDER — DIPHENHYDRAMINE HCL 25 MG PO CAPS
50.0000 mg | ORAL_CAPSULE | Freq: Once | ORAL | Status: AC
  Administered 2019-01-06: 50 mg via ORAL

## 2019-01-06 MED ORDER — ACETAMINOPHEN 325 MG PO TABS
650.0000 mg | ORAL_TABLET | Freq: Once | ORAL | Status: AC
  Administered 2019-01-06: 650 mg via ORAL

## 2019-01-06 MED ORDER — SODIUM CHLORIDE 0.9% FLUSH
10.0000 mL | INTRAVENOUS | Status: DC | PRN
  Administered 2019-01-06: 13:00:00 10 mL
  Filled 2019-01-06: qty 10

## 2019-01-06 MED ORDER — SODIUM CHLORIDE 0.9 % IV SOLN
375.0000 mg/m2 | Freq: Once | INTRAVENOUS | Status: AC
  Administered 2019-01-06: 11:00:00 800 mg via INTRAVENOUS
  Filled 2019-01-06: qty 50

## 2019-01-06 MED ORDER — ACETAMINOPHEN 325 MG PO TABS
ORAL_TABLET | ORAL | Status: AC
  Filled 2019-01-06: qty 2

## 2019-01-06 MED ORDER — HEPARIN SOD (PORK) LOCK FLUSH 100 UNIT/ML IV SOLN
500.0000 [IU] | Freq: Once | INTRAVENOUS | Status: AC | PRN
  Administered 2019-01-06: 13:00:00 500 [IU]
  Filled 2019-01-06: qty 5

## 2019-01-06 MED ORDER — SODIUM CHLORIDE 0.9% FLUSH
10.0000 mL | Freq: Once | INTRAVENOUS | Status: AC
  Administered 2019-01-06: 10 mL
  Filled 2019-01-06: qty 10

## 2019-01-06 NOTE — Progress Notes (Signed)
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 CT CHEST W CONTRAST    Standing Status:   Future    Standing Expiration Date:   01/06/2020    Order Specific Question:   If indicated for the ordered procedure, I authorize the administration of contrast media per Radiology protocol    Answer:   Yes    Order Specific Question:   Preferred imaging location?    Answer:   [facility]    Order Specific Question:   Radiology Contrast Protocol - do NOT remove file path    Answer:   \\[email or web address]    Order Specific Question:   Is patient pregnant?    Answer:   No  . CT Abdomen Pelvis W Contrast    Standing Status:   Future    Standing Expiration Date:   01/06/2020    Order Specific Question:   If indicated for the ordered procedure, I authorize the administration of contrast media per Radiology protocol    Answer:   Yes    Order Specific Question:   Is patient pregnant?    Answer:   No    Order Specific Question:   Preferred imaging location?    Answer:   [facility]    Order Specific Question:   Is Oral Contrast  requested for this exam?    Answer:   Yes, Per Radiology protocol    Order Specific Question:   Radiology Contrast Protocol - do NOT remove file path    Answer:   \\[email or web address]    INTERVAL HISTORY: Please see below for problem oriented charting. She returns for further evaluation and follow-up She has palpable lymphadenopathy in the right axilla that is enlarging She denies recent cough, fever or chills No other new lymphadenopathy  SUMMARY OF ONCOLOGIC HISTORY:   CLL (chronic lymphocytic leukemia) ([place])   04/05/2015 Pathology Results    Accession: [id number] flow cytometry confirmed CLL. FISH was positive for p53 mutation    04/24/2015 Imaging    Extensive lymphadenopathy throughout the neck, chest (axilla), abdomen and pelvis, as detailed above, compatible with the reported clinical history of lymphoma. 2. Mild splenomegaly.    05/03/2015 - 08/27/2015 Chemotherapy    She started on Ibrutinib, discontinued prematurely when her prescription ran out    10/10/2015 - 05/29/2017 Chemotherapy    She was restarted back on Ibrutinib    11/13/2016 PET scan    Significant generalized reduction in size of numerous lymph nodes in the neck, chest, abdomen, and pelvis. Previously the activity of these nodes was low-level and in general a similar low-level activity is present today, significantly less than mediastinal blood pool activity, compatible with Deauville 2.  2. Coronary atherosclerosis. Mild cardiomegaly. 3. Mildly prominent endometrium for age without accentuated metabolic activity in the endometrium. Consider pelvic sonography for further characterization.    05/27/2017 PET scan    1. Progressive hypermetabolic adenopathy, primarily involving cervical, axillary, pelvic and inguinal lymph nodes bilaterally, consistent with progressive lymphoma. 2. No solid visceral organ or osseous involvement.    06/16/2017 - 08/25/2017 Chemotherapy    The patient had 3 cycles  of Rituximab and Bendamustine    07/15/2017 - 07/19/2017 Hospital Admission    The patient was briefly admitted to the hospital due to infusion reaction to rituximab    09/18/2017 PET scan    1. Continued considerable adenopathy in the neck, chest, abdomen, and pelvis. This is generally stable in size but mildly reduced in activity compared to the prior exam. Current levels of activity primarily Deauville 3 and Deauville 4. No splenomegaly. 2. Diffuse new ground-glass opacities in the lungs with associated hypermetabolic activity. Some forms of lymphoma infiltration can rarely cause this pattern of diffuse ground-glass opacity and hypermetabolic activity. Differential diagnostic considerations might include atypical pneumonia such as mycoplasma, acute hypersensitivity pneumonitis, or acute eosinophilic pneumonia. Pulmonary hemorrhage seems less likely to cause this degree of accentuated metabolic activity.  3. Aortic Atherosclerosis (ICD10-I70.0). Coronary atherosclerosis.    09/24/2017 -  Chemotherapy    The patient had ventoclax for chemotherapy treatment.  Rituximab is added on 11/18/17 to 04/09/18, x 6 cycles    11/19/2017 PET scan    Overall mild interval decrease in hypermetabolic lymphadenopathy throughout the neck, chest, abdomen, and pelvis. No new or increased lymphadenopathy identified.  While there has been resolution of diffuse hypermetabolic bilateral ground-glass pulmonary opacity since prior study, there is a new 14 mm hypermetabolic pulmonary nodule in the posterior right lower lobe. Time course favors inflammatory or infectious etiology over neoplasm. Recommend continued follow-up by chest CT in 3 months.    02/16/2018 PET scan    1. Adenopathy in the neck, chest, and pelvis is stable to minimally reduced in size, and is moderately reduced in activity, primarily Deauville 2 disease today. There is a right common iliac lymph node qualifying as Deauville 3 disease which is stable in  size but reduced in activity. 2. Stable size but reduced activity in a pulmonary nodule in the right lower lobe. If this represents a leukemic lesion then a corresponds to Deauville 4 disease. This lesion was not readily apparent on 09/22/2017 but was reported on the prior chest CT of 11/19/2017. 3. Other imaging findings of potential clinical significance: Mild thyroid goiter. Aortic Atherosclerosis (ICD10-I70.0). Coronary atherosclerosis. Mild cardiomegaly.    04/19/2018 PET scan    1. Interval mild mixed metabolic changes, as detailed. Persistent mildly hypermetabolic bilateral axillary, mediastinal and bilateral pelvic adenopathy and mildly hypermetabolic right lower lobe pulmonary nodule compatible with lymphoproliferative disorder. Deauville 4 based on the subcarinal node. 2. Aortic Atherosclerosis (ICD10-I70.0).    06/29/2018 Procedure    Successful placement of a right internal jugular approach power injectable Port-A-Cath. The catheter is ready for immediate use.    07/26/2018 PET scan    1. Adenopathy primarily in the chest and pelvis as noted above, with size of the mildly enlarged lymph nodes stable to minimally increased, but with nodal activity generally decreased compared to the prior exam. Uptake in these nodes is primarily Deauville 2. 2. Aortic Atherosclerosis (ICD10-I70.0). Coronary atherosclerosis with mild cardiomegaly.    10/29/2018 PET scan    1. Relatively stable sized lymphadenopathy but interval increase in  hypermetabolism when compared to the most recent prior PET-CT, as detailed above. 2. No new disease is identified. 3. Stable bilateral pulmonary nodules.    11/12/2018 -  Chemotherapy    The patient had riTUXimab (RITUXAN) 800 mg in sodium chloride 0.9 % 170 mL infusion, 375 mg/m2 = 800 mg, Intravenous,  Once, 3 of 4 cycles Administration: 800 mg (11/12/2018), 800 mg (12/10/2018)  for chemotherapy treatment.      REVIEW OF SYSTEMS:   Constitutional: Denies fevers,  chills or abnormal weight loss Eyes: Denies blurriness of vision Ears, nose, mouth, throat, and face: Denies mucositis or sore throat Respiratory: Denies cough, dyspnea or wheezes Cardiovascular: Denies palpitation, chest discomfort or lower extremity swelling Gastrointestinal:  Denies nausea, heartburn or change in bowel habits Skin: Denies abnormal skin rashes Neurological:Denies numbness, tingling or new weaknesses Behavioral/Psych: Mood is stable, no new changes  All other systems were reviewed with the patient and are negative.  I have reviewed the past medical history, past surgical history, social history and family history with the patient and they are unchanged from previous note.  ALLERGIES:  has No Known Allergies.  MEDICATIONS:  Current Outpatient Medications  Medication Sig Dispense Refill  . allopurinol (ZYLOPRIM) 300 MG tablet Take 1 tablet (300 mg total) by mouth daily. 30 tablet 3  . Cholecalciferol (VITAMIN D-3) 1000 units CAPS Take 1,000 Units by mouth daily with breakfast.    . ELIQUIS 5 MG TABS tablet TAKE 1 TABLET BY MOUTH TWICE A DAY 180 tablet 1  . furosemide (LASIX) 20 MG tablet TAKE 1 TABLET BY MOUTH EVERY DAY AS NEEDED 90 tablet 2  . lidocaine-prilocaine (EMLA) cream Apply 1 application topically as needed. 30 g 6  . metoprolol succinate (TOPROL-XL) 100 MG 24 hr tablet Take 1 1/2 tablets (150 mg total) daily. Take with or immediately following a meal. 135 tablet 2  . potassium chloride SA (K-DUR,KLOR-CON) 20 MEQ tablet Take 1 tablet (20 mEq total) by mouth daily. 30 tablet 11  . sacubitril-valsartan (ENTRESTO) 24-26 MG Take 1 tablet by mouth 2 (two) times daily. 180 tablet 3  . venetoclax (VENCLEXTA) 100 MG TABS Take 200 mg by mouth daily. Take with food and water at approximately the same time each day. 60 tablet 3   No current facility-administered medications for this visit.    Facility-Administered Medications Ordered in Other Visits  Medication Dose  Route Frequency Provider Last Rate Last Dose  . heparin lock flush 100 unit/mL  500 Units Intracatheter Once PRN Alvy Bimler, Yadiel Aubry, MD      . sodium chloride flush (NS) 0.9 % injection 10 mL  10 mL Intracatheter PRN Alvy Bimler, Rehmat Murtagh, MD        PHYSICAL EXAMINATION: ECOG PERFORMANCE STATUS: 1 - Symptomatic but completely ambulatory  Vitals:   01/06/19 1005  BP: (!) 158/76  Pulse: (!) 54  Resp: 18  Temp: (!) 97.4 F (36.3 C)  SpO2: 100%   Filed Weights   01/06/19 1005  Weight: 200 lb 3.2 oz (90.8 kg)    GENERAL:alert, no distress and comfortable SKIN: skin color, texture, turgor are normal, no rashes or significant lesions EYES: normal, Conjunctiva are pink and non-injected, sclera clear OROPHARYNX:no exudate, no erythema and lips, buccal mucosa, and tongue normal  NECK: supple, thyroid normal size, non-tender, without nodularity LYMPH: She has palpable lymphadenopathy in the right axillary region LUNGS: clear to auscultation and percussion with normal breathing effort HEART: regular rate & rhythm and no murmurs and no lower extremity edema ABDOMEN:abdomen  soft, non-tender and normal bowel sounds Musculoskeletal:no cyanosis of digits and no clubbing  NEURO: alert & oriented x 3 with fluent speech, no focal motor/sensory deficits  LABORATORY DATA:  I have reviewed the data as listed    Component Value Date/Time   NA 143 01/06/2019 0910   NA 140 10/09/2017 1416   K 4.0 01/06/2019 0910   K 4.4 10/09/2017 1416   CL 109 01/06/2019 0910   CL 105 01/18/2013 0912   CO2 25 01/06/2019 0910   CO2 25 10/09/2017 1416   GLUCOSE 111 (H) 01/06/2019 0910   GLUCOSE 117 10/09/2017 1416   GLUCOSE 83 01/18/2013 0912   BUN 13 01/06/2019 0910   BUN 11.1 10/09/2017 1416   CREATININE 0.76 01/06/2019 0910   CREATININE 0.72 11/01/2018 0903   CREATININE 0.8 10/09/2017 1416   CALCIUM 9.0 01/06/2019 0910   CALCIUM 9.6 10/09/2017 1416   PROT 6.3 (L) 01/06/2019 0910   PROT 6.4 10/09/2017 1416   ALBUMIN  4.1 01/06/2019 0910   ALBUMIN 3.4 (L) 10/09/2017 1416   AST 21 01/06/2019 0910   AST 17 11/01/2018 0903   AST 18 10/09/2017 1416   ALT 19 01/06/2019 0910   ALT 15 11/01/2018 0903   ALT 19 10/09/2017 1416   ALKPHOS 75 01/06/2019 0910   ALKPHOS 69 10/09/2017 1416   BILITOT 0.8 01/06/2019 0910   BILITOT 1.3 (H) 11/01/2018 0903   BILITOT 0.57 10/09/2017 1416   GFRNONAA >60 01/06/2019 0910   GFRNONAA >60 11/01/2018 0903   GFRAA >60 01/06/2019 0910   GFRAA >60 11/01/2018 0903    No results found for: SPEP, UPEP  Lab Results  Component Value Date   WBC 2.2 (L) 01/06/2019   NEUTROABS 0.7 (L) 01/06/2019   HGB 11.1 (L) 01/06/2019   HCT 34.2 (L) 01/06/2019   MCV 92.2 01/06/2019   PLT 150 01/06/2019      Chemistry      Component Value Date/Time   NA 143 01/06/2019 0910   NA 140 10/09/2017 1416   K 4.0 01/06/2019 0910   K 4.4 10/09/2017 1416   CL 109 01/06/2019 0910   CL 105 01/18/2013 0912   CO2 25 01/06/2019 0910   CO2 25 10/09/2017 1416   BUN 13 01/06/2019 0910   BUN 11.1 10/09/2017 1416   CREATININE 0.76 01/06/2019 0910   CREATININE 0.72 11/01/2018 0903   CREATININE 0.8 10/09/2017 1416      Component Value Date/Time   CALCIUM 9.0 01/06/2019 0910   CALCIUM 9.6 10/09/2017 1416   ALKPHOS 75 01/06/2019 0910   ALKPHOS 69 10/09/2017 1416   AST 21 01/06/2019 0910   AST 17 11/01/2018 0903   AST 18 10/09/2017 1416   ALT 19 01/06/2019 0910   ALT 15 11/01/2018 0903   ALT 19 10/09/2017 1416   BILITOT 0.8 01/06/2019 0910   BILITOT 1.3 (H) 11/01/2018 0903   BILITOT 0.57 10/09/2017 1416     All questions were answered. The patient knows to call the clinic with any problems, questions or concerns. No barriers to learning was detected.  I spent 25 minutes counseling the patient face to face. The total time spent in the appointment was 30 minutes and more than 50% was on counseling and review of test results  Heath Lark, MD 01/06/2019 12:37 PM

## 2019-01-06 NOTE — Assessment & Plan Note (Signed)
She has severe neutropenia I recommend her to hold venetoclax by 1 week and then resume after that As above, I plan to order CT imaging for evaluation

## 2019-01-06 NOTE — Assessment & Plan Note (Signed)
Her blood pressure is elevated likely due to anxiety She will continue medical management

## 2019-01-06 NOTE — Assessment & Plan Note (Signed)
She is concerned that the right axillary lymph node is enlarging I recommend we proceed with infusion of rituximab today I will try to order urgent CT scan next week for evaluation I recommend her to hold venetoclax due to neutropenia

## 2019-01-06 NOTE — Progress Notes (Signed)
Per Dr. Alvy Bimler, it is ok to treat with Rituxan today with ANC 0.7

## 2019-01-06 NOTE — Patient Instructions (Signed)
[facility] Discharge Instructions for Patients Receiving Chemotherapy  Today you received the following chemotherapy agents:  Rituxan.  To help prevent nausea and vomiting after your treatment, we encourage you to take your nausea medication as directed.   If you develop nausea and vomiting that is not controlled by your nausea medication, call the clinic.   BELOW ARE SYMPTOMS THAT SHOULD BE REPORTED IMMEDIATELY:  *FEVER GREATER THAN 100.5 F  *CHILLS WITH OR WITHOUT FEVER  NAUSEA AND VOMITING THAT IS NOT CONTROLLED WITH YOUR NAUSEA MEDICATION  *UNUSUAL SHORTNESS OF BREATH  *UNUSUAL BRUISING OR BLEEDING  TENDERNESS IN MOUTH AND THROAT WITH OR WITHOUT PRESENCE OF ULCERS  *URINARY PROBLEMS  *BOWEL PROBLEMS  UNUSUAL RASH Items with * indicate a potential emergency and should be followed up as soon as possible.  Feel free to call the clinic should you have any questions or concerns. The clinic phone number is [phone].  Please show the CHEMO ALERT CARD at check-in to the Emergency Department and triage nurse.   

## 2019-01-11 ENCOUNTER — Telehealth: Payer: Self-pay

## 2019-01-11 ENCOUNTER — Encounter (HOSPITAL_COMMUNITY): Payer: Self-pay

## 2019-01-11 ENCOUNTER — Other Ambulatory Visit: Payer: Self-pay | Admitting: Pharmacist

## 2019-01-11 ENCOUNTER — Telehealth: Payer: Self-pay | Admitting: Hematology and Oncology

## 2019-01-11 ENCOUNTER — Other Ambulatory Visit: Payer: Self-pay

## 2019-01-11 ENCOUNTER — Encounter: Payer: Self-pay | Admitting: Hematology and Oncology

## 2019-01-11 ENCOUNTER — Ambulatory Visit (HOSPITAL_COMMUNITY)
Admission: RE | Admit: 2019-01-11 | Discharge: 2019-01-11 | Disposition: A | Discharge status: Home / Self Care | Payer: BLUE CROSS/BLUE SHIELD | Source: Ambulatory Visit | Attending: Hematology and Oncology | Admitting: Hematology and Oncology

## 2019-01-11 ENCOUNTER — Inpatient Hospital Stay (HOSPITAL_BASED_OUTPATIENT_CLINIC_OR_DEPARTMENT_OTHER): Payer: BLUE CROSS/BLUE SHIELD | Admitting: Hematology and Oncology

## 2019-01-11 DIAGNOSIS — I482 Chronic atrial fibrillation, unspecified: Secondary | ICD-10-CM | POA: Diagnosis not present

## 2019-01-11 DIAGNOSIS — C9110 Chronic lymphocytic leukemia of B-cell type not having achieved remission: Secondary | ICD-10-CM | POA: Insufficient documentation

## 2019-01-11 DIAGNOSIS — I429 Cardiomyopathy, unspecified: Secondary | ICD-10-CM | POA: Diagnosis not present

## 2019-01-11 DIAGNOSIS — D702 Other drug-induced agranulocytosis: Secondary | ICD-10-CM

## 2019-01-11 DIAGNOSIS — I10 Essential (primary) hypertension: Secondary | ICD-10-CM

## 2019-01-11 DIAGNOSIS — R918 Other nonspecific abnormal finding of lung field: Secondary | ICD-10-CM | POA: Diagnosis not present

## 2019-01-11 DIAGNOSIS — Z79899 Other long term (current) drug therapy: Secondary | ICD-10-CM | POA: Diagnosis not present

## 2019-01-11 DIAGNOSIS — Z9221 Personal history of antineoplastic chemotherapy: Secondary | ICD-10-CM

## 2019-01-11 DIAGNOSIS — Z5112 Encounter for antineoplastic immunotherapy: Secondary | ICD-10-CM | POA: Diagnosis not present

## 2019-01-11 MED ORDER — IOHEXOL 300 MG/ML  SOLN
100.0000 mL | Freq: Once | INTRAMUSCULAR | Status: AC | PRN
  Administered 2019-01-11: 100 mL via INTRAVENOUS

## 2019-01-11 MED ORDER — SODIUM CHLORIDE (PF) 0.9 % IJ SOLN
INTRAMUSCULAR | Status: AC
  Filled 2019-01-11: qty 50

## 2019-01-11 NOTE — Telephone Encounter (Signed)
lvm advising pt that someone will be calling her to schedule her biopsy on 4/16, 4/17, 4/20 or 4/21.  Instructed her to let us know if she has not heard from anyone to schedule by 4/9.

## 2019-01-11 NOTE — Progress Notes (Signed)
[place] OFFICE PROGRESS NOTE  Patient Care Team: Janith Lima, MD as PCP - General (Internal Medicine) Constance Haw, MD as PCP - Electrophysiology (Cardiology) Constance Haw, MD as Consulting Physician (Cardiology)  ASSESSMENT & PLAN:  CLL (chronic lymphocytic leukemia) ([place]) I have reviewed multiple imaging studies with the patient Unfortunately, she have signs of disease progression I recommend biopsy of her right axillary lymph nodes to exclude histological transformation to lymphoma The patient is off on 4/16, 4/17, 4/20 and 4/21 I will try to get her biopsy scheduled for those days She is recommended to continue on venetoclax for now I also recommend her to hold anticoagulation therapy for 2 days prior to scheduled biopsy. I will see her back as soon as results are available.   Orders Placed This Encounter  Procedures  . [place] AXILLARY NODE CORE BIOPSY RIGHT    Standing Status:   Future    Standing Expiration Date:   03/12/2020    Order Specific Question:   Reason for Exam (SYMPTOM  OR DIAGNOSIS REQUIRED)    Answer:   need core biopsy for lymphoma    Order Specific Question:   Preferred location?    Answer:   [facility]    INTERVAL HISTORY: Please see below for problem oriented charting. She is seen urgently today to review test results Since last time I saw her, she feels well She underwent CT imaging study today which show evidence of disease progression  SUMMARY OF ONCOLOGIC HISTORY:   CLL (chronic lymphocytic leukemia) ([place])   04/05/2015 Pathology Results    Accession: [id number] flow cytometry confirmed CLL. FISH was positive for p53 mutation    04/24/2015 Imaging    Extensive lymphadenopathy throughout the neck, chest (axilla), abdomen and pelvis, as detailed above, compatible with the reported clinical history of lymphoma. 2. Mild splenomegaly.    05/03/2015 - 08/27/2015 Chemotherapy    She started on Ibrutinib,  discontinued prematurely when her prescription ran out    10/10/2015 - 05/29/2017 Chemotherapy    She was restarted back on Ibrutinib    11/13/2016 PET scan    Significant generalized reduction in size of numerous lymph nodes in the neck, chest, abdomen, and pelvis. Previously the activity of these nodes was low-level and in general a similar low-level activity is present today, significantly less than mediastinal blood pool activity, compatible with Deauville 2. 2. Coronary atherosclerosis. Mild cardiomegaly. 3. Mildly prominent endometrium for age without accentuated metabolic activity in the endometrium. Consider pelvic sonography for further characterization.    05/27/2017 PET scan    1. Progressive hypermetabolic adenopathy, primarily involving cervical, axillary, pelvic and inguinal lymph nodes bilaterally, consistent with progressive lymphoma. 2. No solid visceral organ or osseous involvement.    06/16/2017 - 08/25/2017 Chemotherapy    The patient had 3 cycles of Rituximab and Bendamustine    07/15/2017 - 07/19/2017 Hospital Admission    The patient was briefly admitted to the hospital due to infusion reaction to rituximab    09/18/2017 PET scan    1. Continued considerable adenopathy in the neck, chest, abdomen, and pelvis. This is generally stable in size but mildly reduced in activity compared to the prior exam. Current levels of activity primarily Deauville 3 and Deauville 4. No splenomegaly. 2. Diffuse new ground-glass opacities in the lungs with associated hypermetabolic activity. Some forms of lymphoma infiltration can rarely cause this pattern of diffuse ground-glass opacity and hypermetabolic activity. Differential diagnostic considerations might include atypical pneumonia such  as mycoplasma, acute hypersensitivity pneumonitis, or acute eosinophilic pneumonia. Pulmonary hemorrhage seems less likely to cause this degree of accentuated metabolic activity.  3. Aortic Atherosclerosis  (ICD10-I70.0). Coronary atherosclerosis.    09/24/2017 -  Chemotherapy    The patient had ventoclax for chemotherapy treatment.  Rituximab is added on 11/18/17 to 04/09/18, x 6 cycles    11/19/2017 PET scan    Overall mild interval decrease in hypermetabolic lymphadenopathy throughout the neck, chest, abdomen, and pelvis. No new or increased lymphadenopathy identified.  While there has been resolution of diffuse hypermetabolic bilateral ground-glass pulmonary opacity since prior study, there is a new 14 mm hypermetabolic pulmonary nodule in the posterior right lower lobe. Time course favors inflammatory or infectious etiology over neoplasm. Recommend continued follow-up by chest CT in 3 months.    02/16/2018 PET scan    1. Adenopathy in the neck, chest, and pelvis is stable to minimally reduced in size, and is moderately reduced in activity, primarily Deauville 2 disease today. There is a right common iliac lymph node qualifying as Deauville 3 disease which is stable in size but reduced in activity. 2. Stable size but reduced activity in a pulmonary nodule in the right lower lobe. If this represents a leukemic lesion then a corresponds to Deauville 4 disease. This lesion was not readily apparent on 09/22/2017 but was reported on the prior chest CT of 11/19/2017. 3. Other imaging findings of potential clinical significance: Mild thyroid goiter. Aortic Atherosclerosis (ICD10-I70.0). Coronary atherosclerosis. Mild cardiomegaly.    04/19/2018 PET scan    1. Interval mild mixed metabolic changes, as detailed. Persistent mildly hypermetabolic bilateral axillary, mediastinal and bilateral pelvic adenopathy and mildly hypermetabolic right lower lobe pulmonary nodule compatible with lymphoproliferative disorder. Deauville 4 based on the subcarinal node. 2. Aortic Atherosclerosis (ICD10-I70.0).    06/29/2018 Procedure    Successful placement of a right internal jugular approach power injectable Port-A-Cath.  The catheter is ready for immediate use.    07/26/2018 PET scan    1. Adenopathy primarily in the chest and pelvis as noted above, with size of the mildly enlarged lymph nodes stable to minimally increased, but with nodal activity generally decreased compared to the prior exam. Uptake in these nodes is primarily Deauville 2. 2. Aortic Atherosclerosis (ICD10-I70.0). Coronary atherosclerosis with mild cardiomegaly.    10/29/2018 PET scan    1. Relatively stable sized lymphadenopathy but interval increase in hypermetabolism when compared to the most recent prior PET-CT, as detailed above. 2. No new disease is identified. 3. Stable bilateral pulmonary nodules.    11/12/2018 -  Chemotherapy    The patient had riTUXimab (RITUXAN) 800 mg in sodium chloride 0.9 % 170 mL infusion, 375 mg/m2 = 800 mg, Intravenous,  Once, 3 of 4 cycles Administration: 800 mg (11/12/2018), 800 mg (12/10/2018), 800 mg (01/06/2019)  for chemotherapy treatment.     01/11/2019 Imaging    1. Significant increased adenopathy in the chest, abdomen, and pelvis compared to the 10/28/2018 PET-CT. 2. The scattered tiny pulmonary nodules and larger single right lower lobe pulmonary nodule are all stable. 3. Other imaging findings of potential clinical significance: Aortic Atherosclerosis (ICD10-I70.0). Coronary atherosclerosis. Mild cardiomegaly. Mild diffuse thyroid prominence, stable. Mild left foraminal impingement at L4-5.     REVIEW OF SYSTEMS:   Constitutional: Denies fevers, chills or abnormal weight loss Eyes: Denies blurriness of vision Ears, nose, mouth, throat, and face: Denies mucositis or sore throat Respiratory: Denies cough, dyspnea or wheezes Cardiovascular: Denies palpitation, chest discomfort or lower extremity  swelling Gastrointestinal:  Denies nausea, heartburn or change in bowel habits Skin: Denies abnormal skin rashes Lymphatics: Denies new lymphadenopathy or easy bruising Neurological:Denies numbness, tingling  or new weaknesses Behavioral/Psych: Mood is stable, no new changes  All other systems were reviewed with the patient and are negative.  I have reviewed the past medical history, past surgical history, social history and family history with the patient and they are unchanged from previous note.  ALLERGIES:  has No Known Allergies.  MEDICATIONS:  Current Outpatient Medications  Medication Sig Dispense Refill  . allopurinol (ZYLOPRIM) 300 MG tablet Take 1 tablet (300 mg total) by mouth daily. 30 tablet 3  . Cholecalciferol (VITAMIN D-3) 1000 units CAPS Take 1,000 Units by mouth daily with breakfast.    . ELIQUIS 5 MG TABS tablet TAKE 1 TABLET BY MOUTH TWICE A DAY 180 tablet 1  . furosemide (LASIX) 20 MG tablet TAKE 1 TABLET BY MOUTH EVERY DAY AS NEEDED 90 tablet 2  . lidocaine-prilocaine (EMLA) cream Apply 1 application topically as needed. 30 g 6  . metoprolol succinate (TOPROL-XL) 100 MG 24 hr tablet Take 1 1/2 tablets (150 mg total) daily. Take with or immediately following a meal. 135 tablet 2  . potassium chloride SA (K-DUR,KLOR-CON) 20 MEQ tablet Take 1 tablet (20 mEq total) by mouth daily. 30 tablet 11  . sacubitril-valsartan (ENTRESTO) 24-26 MG Take 1 tablet by mouth 2 (two) times daily. 180 tablet 3  . venetoclax (VENCLEXTA) 100 MG TABS Take 200 mg by mouth daily. Take with food and water at approximately the same time each day. 60 tablet 3   No current facility-administered medications for this visit.    Facility-Administered Medications Ordered in Other Visits  Medication Dose Route Frequency Provider Last Rate Last Dose  . sodium chloride (PF) 0.9 % injection             PHYSICAL EXAMINATION: ECOG PERFORMANCE STATUS: 1 - Symptomatic but completely ambulatory  Vitals:   01/11/19 1032  BP: (!) 156/84  Pulse: (!) 57  Resp: 18  Temp: 98.2 F (36.8 C)  SpO2: 100%   Filed Weights   01/11/19 1032  Weight: 201 lb 12.8 oz (91.5 kg)    GENERAL:alert, no distress and  comfortable NEURO: alert & oriented x 3 with fluent speech, no focal motor/sensory deficits  LABORATORY DATA:  I have reviewed the data as listed    Component Value Date/Time   NA 143 01/06/2019 0910   NA 140 10/09/2017 1416   K 4.0 01/06/2019 0910   K 4.4 10/09/2017 1416   CL 109 01/06/2019 0910   CL 105 01/18/2013 0912   CO2 25 01/06/2019 0910   CO2 25 10/09/2017 1416   GLUCOSE 111 (H) 01/06/2019 0910   GLUCOSE 117 10/09/2017 1416   GLUCOSE 83 01/18/2013 0912   BUN 13 01/06/2019 0910   BUN 11.1 10/09/2017 1416   CREATININE 0.76 01/06/2019 0910   CREATININE 0.72 11/01/2018 0903   CREATININE 0.8 10/09/2017 1416   CALCIUM 9.0 01/06/2019 0910   CALCIUM 9.6 10/09/2017 1416   PROT 6.3 (L) 01/06/2019 0910   PROT 6.4 10/09/2017 1416   ALBUMIN 4.1 01/06/2019 0910   ALBUMIN 3.4 (L) 10/09/2017 1416   AST 21 01/06/2019 0910   AST 17 11/01/2018 0903   AST 18 10/09/2017 1416   ALT 19 01/06/2019 0910   ALT 15 11/01/2018 0903   ALT 19 10/09/2017 1416   ALKPHOS 75 01/06/2019 0910   ALKPHOS 69 10/09/2017 1416  BILITOT 0.8 01/06/2019 0910   BILITOT 1.3 (H) 11/01/2018 0903   BILITOT 0.57 10/09/2017 1416   GFRNONAA >60 01/06/2019 0910   GFRNONAA >60 11/01/2018 0903   GFRAA >60 01/06/2019 0910   GFRAA >60 11/01/2018 0903    No results found for: SPEP, UPEP  Lab Results  Component Value Date   WBC 2.2 (L) 01/06/2019   NEUTROABS 0.7 (L) 01/06/2019   HGB 11.1 (L) 01/06/2019   HCT 34.2 (L) 01/06/2019   MCV 92.2 01/06/2019   PLT 150 01/06/2019      Chemistry      Component Value Date/Time   NA 143 01/06/2019 0910   NA 140 10/09/2017 1416   K 4.0 01/06/2019 0910   K 4.4 10/09/2017 1416   CL 109 01/06/2019 0910   CL 105 01/18/2013 0912   CO2 25 01/06/2019 0910   CO2 25 10/09/2017 1416   BUN 13 01/06/2019 0910   BUN 11.1 10/09/2017 1416   CREATININE 0.76 01/06/2019 0910   CREATININE 0.72 11/01/2018 0903   CREATININE 0.8 10/09/2017 1416      Component Value Date/Time    CALCIUM 9.0 01/06/2019 0910   CALCIUM 9.6 10/09/2017 1416   ALKPHOS 75 01/06/2019 0910   ALKPHOS 69 10/09/2017 1416   AST 21 01/06/2019 0910   AST 17 11/01/2018 0903   AST 18 10/09/2017 1416   ALT 19 01/06/2019 0910   ALT 15 11/01/2018 0903   ALT 19 10/09/2017 1416   BILITOT 0.8 01/06/2019 0910   BILITOT 1.3 (H) 11/01/2018 0903   BILITOT 0.57 10/09/2017 1416       RADIOGRAPHIC STUDIES: I have reviewed multiple imaging studies with the patient I have personally reviewed the radiological images as listed and agreed with the findings in the report. Ct Chest W Contrast  Result Date: 01/11/2019 CLINICAL DATA:  Ongoing chemotherapy for chronic lymphocytic leukemia diagnosed in 2010. EXAM: CT CHEST, ABDOMEN, AND PELVIS WITH CONTRAST TECHNIQUE: Multidetector CT imaging of the chest, abdomen and pelvis was performed following the standard protocol during bolus administration of intravenous contrast. CONTRAST:  146m OMNIPAQUE IOHEXOL 300 MG/ML  SOLN COMPARISON:  PET-CT from 10/28/2018 FINDINGS: CT CHEST FINDINGS Cardiovascular: Right Port-A-Cath tip: Cavoatrial junction. Left anterior descending and right coronary artery atherosclerosis. Mild cardiomegaly. Mediastinum/Nodes: Prominent bilateral axillary, subpectoral, supraclavicular, and mediastinal nodes are present. An index right axillary node measures 2.8 cm in short axis on image 13/2 and formerly measured 1.8 cm in short axis. An index left axillary lymph node measures 2.0 cm in short axis on image 14/2 and was formerly 1.3 cm in short axis. A subcarinal node measures 1.7 cm in short axis on image 28/2, formerly 1.1 cm. Other bulky axillary and subpectoral lymph nodes are present. A supraclavicular node on the left measures 1.2 cm on image 4/2, formerly 0.8 cm. Mild diffuse prominence of the thyroid gland is stable. Lungs/Pleura: The scattered small nodules in both lungs are essentially stable. A dominant rounded nodule in the right lower lobe  measures 1.7 by 1.3 cm on image 89/6, formerly 1.6 by 1.3 cm. Musculoskeletal: Unremarkable CT ABDOMEN PELVIS FINDINGS Hepatobiliary: Unremarkable.  Mildly contracted gallbladder. Pancreas: Unremarkable Spleen: Unremarkable.  No splenomegaly. Adrenals/Urinary Tract: Small hypodense lesions in the upper pole the right kidney are technically too small to characterize although statistically likely to be cysts. Adrenal glands normal. Stomach/Bowel: Unremarkable Vascular/Lymphatic: Aortoiliac atherosclerotic vascular disease. Pathologic adenopathy noted in the splenic, pancreatic, retroperitoneal, external iliac, common iliac, and pelvic sidewall regions with smaller inguinal, mesenteric,  and omental lymph nodes noted. A peripancreatic node measures 1.7 cm in short axis on image 56/2, formerly 1.1 cm. A right common iliac node measures 1.6 cm in short axis on image 91/2, formerly 1.3 cm. A right pelvic sidewall node measures 1.7 cm in short axis on image 105/2, formerly 1.4 cm. A left pelvic sidewall lymph node measures 2.0 cm in short axis on image 105/2, formerly 1.5 cm. Additional enlarged lymph nodes are present along these chains. An upper omental lymph node measures 0.7 cm in short axis on image 70/2 and was not well seen on the prior exam. Scattered mesenteric lymph nodes are present. Reproductive: Poor definition of the endometrium, nonspecific. Other: No supplemental non-categorized findings. Musculoskeletal: Chronic appearing superior endplate compression fracture at L1. Grade 1 degenerative anterolisthesis at L4-5 with associated disc bulge and suspected mild left foraminal stenosis at L4-5 due to disc uncovering and facet arthropathy. IMPRESSION: 1. Significant increased adenopathy in the chest, abdomen, and pelvis compared to the 10/28/2018 PET-CT. 2. The scattered tiny pulmonary nodules and larger single right lower lobe pulmonary nodule are all stable. 3. Other imaging findings of potential clinical  significance: Aortic Atherosclerosis (ICD10-I70.0). Coronary atherosclerosis. Mild cardiomegaly. Mild diffuse thyroid prominence, stable. Mild left foraminal impingement at L4-5. Electronically Signed   By: Van Clines M.D.   On: 01/11/2019 09:16   Ct Abdomen Pelvis W Contrast  Result Date: 01/11/2019 CLINICAL DATA:  Ongoing chemotherapy for chronic lymphocytic leukemia diagnosed in 2010. EXAM: CT CHEST, ABDOMEN, AND PELVIS WITH CONTRAST TECHNIQUE: Multidetector CT imaging of the chest, abdomen and pelvis was performed following the standard protocol during bolus administration of intravenous contrast. CONTRAST:  148m OMNIPAQUE IOHEXOL 300 MG/ML  SOLN COMPARISON:  PET-CT from 10/28/2018 FINDINGS: CT CHEST FINDINGS Cardiovascular: Right Port-A-Cath tip: Cavoatrial junction. Left anterior descending and right coronary artery atherosclerosis. Mild cardiomegaly. Mediastinum/Nodes: Prominent bilateral axillary, subpectoral, supraclavicular, and mediastinal nodes are present. An index right axillary node measures 2.8 cm in short axis on image 13/2 and formerly measured 1.8 cm in short axis. An index left axillary lymph node measures 2.0 cm in short axis on image 14/2 and was formerly 1.3 cm in short axis. A subcarinal node measures 1.7 cm in short axis on image 28/2, formerly 1.1 cm. Other bulky axillary and subpectoral lymph nodes are present. A supraclavicular node on the left measures 1.2 cm on image 4/2, formerly 0.8 cm. Mild diffuse prominence of the thyroid gland is stable. Lungs/Pleura: The scattered small nodules in both lungs are essentially stable. A dominant rounded nodule in the right lower lobe measures 1.7 by 1.3 cm on image 89/6, formerly 1.6 by 1.3 cm. Musculoskeletal: Unremarkable CT ABDOMEN PELVIS FINDINGS Hepatobiliary: Unremarkable.  Mildly contracted gallbladder. Pancreas: Unremarkable Spleen: Unremarkable.  No splenomegaly. Adrenals/Urinary Tract: Small hypodense lesions in the upper pole  the right kidney are technically too small to characterize although statistically likely to be cysts. Adrenal glands normal. Stomach/Bowel: Unremarkable Vascular/Lymphatic: Aortoiliac atherosclerotic vascular disease. Pathologic adenopathy noted in the splenic, pancreatic, retroperitoneal, external iliac, common iliac, and pelvic sidewall regions with smaller inguinal, mesenteric, and omental lymph nodes noted. A peripancreatic node measures 1.7 cm in short axis on image 56/2, formerly 1.1 cm. A right common iliac node measures 1.6 cm in short axis on image 91/2, formerly 1.3 cm. A right pelvic sidewall node measures 1.7 cm in short axis on image 105/2, formerly 1.4 cm. A left pelvic sidewall lymph node measures 2.0 cm in short axis on image 105/2, formerly 1.5 cm.  Additional enlarged lymph nodes are present along these chains. An upper omental lymph node measures 0.7 cm in short axis on image 70/2 and was not well seen on the prior exam. Scattered mesenteric lymph nodes are present. Reproductive: Poor definition of the endometrium, nonspecific. Other: No supplemental non-categorized findings. Musculoskeletal: Chronic appearing superior endplate compression fracture at L1. Grade 1 degenerative anterolisthesis at L4-5 with associated disc bulge and suspected mild left foraminal stenosis at L4-5 due to disc uncovering and facet arthropathy. IMPRESSION: 1. Significant increased adenopathy in the chest, abdomen, and pelvis compared to the 10/28/2018 PET-CT. 2. The scattered tiny pulmonary nodules and larger single right lower lobe pulmonary nodule are all stable. 3. Other imaging findings of potential clinical significance: Aortic Atherosclerosis (ICD10-I70.0). Coronary atherosclerosis. Mild cardiomegaly. Mild diffuse thyroid prominence, stable. Mild left foraminal impingement at L4-5. Electronically Signed   By: Van Clines M.D.   On: 01/11/2019 09:16    All questions were answered. The patient knows to call  the clinic with any problems, questions or concerns. No barriers to learning was detected.  I spent 15 minutes counseling the patient face to face. The total time spent in the appointment was 20 minutes and more than 50% was on counseling and review of test results  Heath Lark, MD 01/11/2019 10:58 AM

## 2019-01-11 NOTE — Telephone Encounter (Signed)
Per 4/7 los - Return for No new orders

## 2019-01-11 NOTE — Telephone Encounter (Signed)
I called both numbers listed for pt.  LVM on cell number.  No answer, no vm on land line.  I also called CT dept but they said she had already left.  My msg instructed her to call back ASAP

## 2019-01-11 NOTE — Telephone Encounter (Signed)
pls try to call her husband as well. Thanks

## 2019-01-11 NOTE — Assessment & Plan Note (Signed)
I have reviewed multiple imaging studies with the patient Unfortunately, she have signs of disease progression I recommend biopsy of her right axillary lymph nodes to exclude histological transformation to lymphoma The patient is off on 4/16, 4/17, 4/20 and 4/21 I will try to get her biopsy scheduled for those days She is recommended to continue on venetoclax for now I also recommend her to hold anticoagulation therapy for 2 days prior to scheduled biopsy. I will see her back as soon as results are available.

## 2019-01-17 ENCOUNTER — Telehealth: Payer: Self-pay

## 2019-01-17 NOTE — Telephone Encounter (Signed)
Called both home # and cell # with no answer.

## 2019-01-17 NOTE — Telephone Encounter (Signed)
Called and left a message for her to call the office. Scheduling is try to schedule biopsy.

## 2019-01-18 ENCOUNTER — Telehealth: Payer: Self-pay | Admitting: *Deleted

## 2019-01-18 NOTE — Telephone Encounter (Addendum)
-   Audrey Gross, thanks for trying Please document this in her chart that we have been trying to reach her for 2 days She has appt scheduled at the end of the month. Will discuss with her if she shows up                 Called all available numbers listed in the chart unable to reach patient. Spouse number is not correct. Left message on cell number, no voicemail on home line or daughter's lines. ----- Message ----- From: Heath Lark, MD Sent: 01/18/2019   8:28 AM EDT To: Belva Chimes, RN Subject: FW: Not returning call                         Shona Simpson,  Can you try all the numbers listed? And if still unable to reach her, let me know ----- Message ----- From: Flo Shanks, RN Sent: 01/17/2019   3:44 PM EDT To: Candise Che, MD Subject: RE: Not returning call                         Hi!   I have called her x 2 today with no answer. The home number just rings and I left a message on mobile # to call.  Thanks! ----- Message ----- From: Heath Lark, MD Sent: 01/17/2019   9:18 AM EDT To: Janene Madeira, RN Subject: RE: Not returning call                         Vonna Kotyk,  Please try to call her When can we get the biopsy scehduled? ----- Message ----- From: Lenore Cordia Sent: 01/17/2019   8:44 AM EDT To: Heath Lark, MD Subject: Not returning call                             Hello Dr Alvy Bimler,  Just wanted to left you know, I have been trying to contact your patient, Audrey Gross, but she is not returning the call.    I did speak with her Daughter Friday and she stated she will call her  And let her know we are trying to reach her.   Last call/Msg Left: 01/17/2019  8:30am

## 2019-01-19 MED FILL — VENCLEXTA 100 MG TABS: 100 | 30 days supply | Qty: 60 | Fill #2

## 2019-01-20 ENCOUNTER — Other Ambulatory Visit: Payer: Self-pay | Admitting: Radiology

## 2019-01-21 ENCOUNTER — Other Ambulatory Visit: Payer: Self-pay

## 2019-01-21 ENCOUNTER — Ambulatory Visit (HOSPITAL_COMMUNITY)
Admission: RE | Admit: 2019-01-21 | Discharge: 2019-01-21 | Disposition: A | Discharge status: Home / Self Care | Payer: BLUE CROSS/BLUE SHIELD | Source: Ambulatory Visit | Attending: Hematology and Oncology | Admitting: Hematology and Oncology

## 2019-01-21 ENCOUNTER — Telehealth: Payer: Self-pay | Admitting: Hematology and Oncology

## 2019-01-21 DIAGNOSIS — I2510 Atherosclerotic heart disease of native coronary artery without angina pectoris: Secondary | ICD-10-CM | POA: Diagnosis not present

## 2019-01-21 DIAGNOSIS — I10 Essential (primary) hypertension: Secondary | ICD-10-CM | POA: Insufficient documentation

## 2019-01-21 DIAGNOSIS — Z8249 Family history of ischemic heart disease and other diseases of the circulatory system: Secondary | ICD-10-CM | POA: Insufficient documentation

## 2019-01-21 DIAGNOSIS — R590 Localized enlarged lymph nodes: Secondary | ICD-10-CM | POA: Insufficient documentation

## 2019-01-21 DIAGNOSIS — Z79899 Other long term (current) drug therapy: Secondary | ICD-10-CM | POA: Insufficient documentation

## 2019-01-21 DIAGNOSIS — C8514 Unspecified B-cell lymphoma, lymph nodes of axilla and upper limb: Secondary | ICD-10-CM | POA: Diagnosis not present

## 2019-01-21 DIAGNOSIS — Z800 Family history of malignant neoplasm of digestive organs: Secondary | ICD-10-CM | POA: Insufficient documentation

## 2019-01-21 DIAGNOSIS — C9110 Chronic lymphocytic leukemia of B-cell type not having achieved remission: Secondary | ICD-10-CM | POA: Diagnosis not present

## 2019-01-21 DIAGNOSIS — Z7901 Long term (current) use of anticoagulants: Secondary | ICD-10-CM | POA: Insufficient documentation

## 2019-01-21 DIAGNOSIS — I4819 Other persistent atrial fibrillation: Secondary | ICD-10-CM | POA: Insufficient documentation

## 2019-01-21 LAB — CBC
HCT: 39.9 % (ref 36.0–46.0)
Hemoglobin: 12.7 g/dL (ref 12.0–15.0)
MCH: 28.3 pg (ref 26.0–34.0)
MCHC: 31.8 g/dL (ref 30.0–36.0)
MCV: 88.9 fL (ref 80.0–100.0)
Platelets: 180 10*3/uL (ref 150–400)
RBC: 4.49 MIL/uL (ref 3.87–5.11)
RDW: 12.7 % (ref 11.5–15.5)
WBC: 2.6 10*3/uL — ABNORMAL LOW (ref 4.0–10.5)
nRBC: 0 % (ref 0.0–0.2)

## 2019-01-21 LAB — PROTIME-INR
INR: 1.1 (ref 0.8–1.2)
Prothrombin Time: 13.8 seconds (ref 11.4–15.2)

## 2019-01-21 MED ORDER — MIDAZOLAM HCL 2 MG/2ML IJ SOLN
INTRAMUSCULAR | Status: AC
  Filled 2019-01-21: qty 2

## 2019-01-21 MED ORDER — FENTANYL CITRATE (PF) 100 MCG/2ML IJ SOLN
INTRAMUSCULAR | Status: AC
  Filled 2019-01-21: qty 2

## 2019-01-21 MED ORDER — FENTANYL CITRATE (PF) 100 MCG/2ML IJ SOLN
INTRAMUSCULAR | Status: AC | PRN
  Administered 2019-01-21: 50 ug via INTRAVENOUS

## 2019-01-21 MED ORDER — SODIUM CHLORIDE 0.9 % IV SOLN: INTRAVENOUS | Status: DC

## 2019-01-21 MED ORDER — HYDROCODONE-ACETAMINOPHEN 5-325 MG PO TABS: 1.0000 | ORAL_TABLET | ORAL | Status: DC | PRN

## 2019-01-21 MED ORDER — HEPARIN SOD (PORK) LOCK FLUSH 100 UNIT/ML IV SOLN
INTRAVENOUS | Status: AC
  Filled 2019-01-21: qty 5

## 2019-01-21 MED ORDER — MIDAZOLAM HCL 2 MG/2ML IJ SOLN
INTRAMUSCULAR | Status: AC | PRN
  Administered 2019-01-21: 0.5 mg via INTRAVENOUS

## 2019-01-21 MED ORDER — LIDOCAINE HCL (PF) 1 % IJ SOLN
INTRAMUSCULAR | Status: AC
  Filled 2019-01-21: qty 30

## 2019-01-21 MED ORDER — HEPARIN SOD (PORK) LOCK FLUSH 100 UNIT/ML IV SOLN
500.0000 [IU] | INTRAVENOUS | Status: AC | PRN
  Administered 2019-01-21: 15:00:00 500 [IU]

## 2019-01-21 NOTE — H&P (Signed)
Chief Complaint: Patient was seen in consultation today for lymphadenopathy  Referring Physician(s): [place]  Supervising Physician: Markus Daft  Patient Status: [facility] - Out-pt  History of Present Illness: Audrey Gross is a 58 y.o. female with past medical history of a fib and CLL on treatment via Port-A-Cath who presents with lymphadenopathy. IR consulted for lymph node biopsy at the request of Dr. Alvy Bimler.   Patient presents for procedure today in her usual state of health.  She has been NPO.  She appropriately held her Eliquis.    Past Medical History:  Diagnosis Date   Atrial fibrillation ([place])    CLL (chronic lymphoblastic leukemia)    Leukemia   Cystic breast    Depression    Diffuse cystic mastopathy    Dyslipidemia (high LDL; low HDL)    Hypertension    Iron deficiency anemia, unspecified    Lymphadenopathy of head and neck 04/05/2015   Medical non-compliance    Mitral regurgitation 02/2017   moderate to severe   Persistent atrial fibrillation with rapid ventricular response 07/15/2017    Past Surgical History:  Procedure Laterality Date   CARDIOVERSION N/A 04/02/2017   Procedure: CARDIOVERSION;  Surgeon: Dorothy Spark, MD;  Location: [facility] ENDOSCOPY;  Service: Cardiovascular;  Laterality: N/A;   CARDIOVERSION N/A 08/19/2017   Procedure: CARDIOVERSION;  Surgeon: Larey Dresser, MD;  Location: [facility] ENDOSCOPY;  Service: Cardiovascular;  Laterality: N/A;   IR IMAGING GUIDED PORT INSERTION  06/29/2018   TUBAL LIGATION      Allergies: Patient has no known allergies.  Medications: Prior to Admission medications   Medication Sig Start Date End Date Taking? Authorizing Provider  allopurinol (ZYLOPRIM) 300 MG tablet Take 1 tablet (300 mg total) by mouth daily. 12/10/18  Yes Gorsuch, Ni, MD  Cholecalciferol (VITAMIN D-3) 1000 units CAPS Take 1,000 Units by mouth daily with breakfast.   Yes [provider]  furosemide (LASIX) 20 MG  tablet TAKE 1 TABLET BY MOUTH EVERY DAY AS NEEDED 11/29/18  Yes Camnitz, Ocie Doyne, MD  metoprolol succinate (TOPROL-XL) 100 MG 24 hr tablet Take 1 1/2 tablets (150 mg total) daily. Take with or immediately following a meal. 11/05/18  Yes Camnitz, Will Hassell Done, MD  potassium chloride SA (K-DUR,KLOR-CON) 20 MEQ tablet Take 1 tablet (20 mEq total) by mouth daily. 11/02/18  Yes Gorsuch, Ni, MD  sacubitril-valsartan (ENTRESTO) 24-26 MG Take 1 tablet by mouth 2 (two) times daily. 10/21/17  Yes Camnitz, Will Hassell Done, MD  venetoclax (VENCLEXTA) 100 MG TABS Take 200 mg by mouth daily. Take with food and water at approximately the same time each day. 10/27/18  Yes Heath Lark, MD  ELIQUIS 5 MG TABS tablet TAKE 1 TABLET BY MOUTH TWICE A DAY 10/22/18   Camnitz, Ocie Doyne, MD  lidocaine-prilocaine (EMLA) cream Apply 1 application topically as needed. 06/04/18   Heath Lark, MD     Family History  Problem Relation Age of Onset   Sudden death Mother    Kidney disease Father        H/O HD and kidney transplant   Stomach cancer Maternal Aunt    Stomach cancer Paternal Grandmother    Heart disease Brother    Heart disease Maternal Grandmother     Social History   Socioeconomic History   Marital status: Married    Spouse name: Not on file   Number of children: 6   Years of education: Not on file   Highest education level: Not on file  Occupational  History   Occupation: Heritage manager: [employer]  Social Designer, fashion/clothing strain: Not on file   Food insecurity:    Worry: Not on file    Inability: Not on file   Transportation needs:    Medical: Not on file    Non-medical: Not on file  Tobacco Use   Smoking status: Never Smoker   Smokeless tobacco: Never Used  Substance and Sexual Activity   Alcohol use: No   Drug use: No   Sexual activity: Not Currently    Partners: Male    Birth control/protection: Surgical    Comment: tubal ligation  Lifestyle   Physical  activity:    Days per week: Not on file    Minutes per session: Not on file   Stress: Not on file  Relationships   Social connections:    Talks on phone: Not on file    Gets together: Not on file    Attends religious service: Not on file    Active member of club or organization: Not on file    Attends meetings of clubs or organizations: Not on file    Relationship status: Not on file  Other Topics Concern   Not on file  Social History Narrative   Not on file    Review of Systems: A 12 point ROS discussed and pertinent positives are indicated in the HPI above.  All other systems are negative.  Review of Systems  Constitutional: Negative for fatigue and fever.  Respiratory: Negative for cough and shortness of breath.   Cardiovascular: Negative for chest pain.  Gastrointestinal: Negative for abdominal pain, nausea and vomiting.  Psychiatric/Behavioral: Negative for behavioral problems and confusion.    Vital Signs: BP (!) 128/91    Pulse (!) 52    Temp 98.7 F (37.1 C) (Oral)    Resp 18    Ht 5\' 7"  (1.702 m)    Wt 200 lb (90.7 kg)    SpO2 100%    BMI 31.32 kg/m   Physical Exam Vitals signs and nursing note reviewed.  Constitutional:      Appearance: Normal appearance.  HENT:     Mouth/Throat:     Mouth: Mucous membranes are moist.     Pharynx: Oropharynx is clear.  Neck:     Musculoskeletal: Normal range of motion.  Cardiovascular:     Rate and Rhythm: Normal rate and regular rhythm.     Pulses: Normal pulses.     Heart sounds: Normal heart sounds. No murmur. No gallop.   Pulmonary:     Effort: Pulmonary effort is normal. No respiratory distress.     Breath sounds: Normal breath sounds.  Skin:    General: Skin is warm and dry.  Neurological:     General: No focal deficit present.     Mental Status: She is alert and oriented to person, place, and time. Mental status is at baseline.  Psychiatric:        Mood and Affect: Mood normal.        Behavior: Behavior  normal.        Thought Content: Thought content normal.        Judgment: Judgment normal.      MD Evaluation Airway: WNL Heart: WNL Abdomen: WNL Chest/ Lungs: WNL ASA  Classification: 3 Mallampati/Airway Score: One   Imaging: Ct Chest W Contrast  Result Date: 01/11/2019 CLINICAL DATA:  Ongoing chemotherapy for chronic lymphocytic leukemia diagnosed in 2010. EXAM: CT CHEST,  ABDOMEN, AND PELVIS WITH CONTRAST TECHNIQUE: Multidetector CT imaging of the chest, abdomen and pelvis was performed following the standard protocol during bolus administration of intravenous contrast. CONTRAST:  174mL OMNIPAQUE IOHEXOL 300 MG/ML  SOLN COMPARISON:  PET-CT from 10/28/2018 FINDINGS: CT CHEST FINDINGS Cardiovascular: Right Port-A-Cath tip: Cavoatrial junction. Left anterior descending and right coronary artery atherosclerosis. Mild cardiomegaly. Mediastinum/Nodes: Prominent bilateral axillary, subpectoral, supraclavicular, and mediastinal nodes are present. An index right axillary node measures 2.8 cm in short axis on image 13/2 and formerly measured 1.8 cm in short axis. An index left axillary lymph node measures 2.0 cm in short axis on image 14/2 and was formerly 1.3 cm in short axis. A subcarinal node measures 1.7 cm in short axis on image 28/2, formerly 1.1 cm. Other bulky axillary and subpectoral lymph nodes are present. A supraclavicular node on the left measures 1.2 cm on image 4/2, formerly 0.8 cm. Mild diffuse prominence of the thyroid gland is stable. Lungs/Pleura: The scattered small nodules in both lungs are essentially stable. A dominant rounded nodule in the right lower lobe measures 1.7 by 1.3 cm on image 89/6, formerly 1.6 by 1.3 cm. Musculoskeletal: Unremarkable CT ABDOMEN PELVIS FINDINGS Hepatobiliary: Unremarkable.  Mildly contracted gallbladder. Pancreas: Unremarkable Spleen: Unremarkable.  No splenomegaly. Adrenals/Urinary Tract: Small hypodense lesions in the upper pole the right kidney are  technically too small to characterize although statistically likely to be cysts. Adrenal glands normal. Stomach/Bowel: Unremarkable Vascular/Lymphatic: Aortoiliac atherosclerotic vascular disease. Pathologic adenopathy noted in the splenic, pancreatic, retroperitoneal, external iliac, common iliac, and pelvic sidewall regions with smaller inguinal, mesenteric, and omental lymph nodes noted. A peripancreatic node measures 1.7 cm in short axis on image 56/2, formerly 1.1 cm. A right common iliac node measures 1.6 cm in short axis on image 91/2, formerly 1.3 cm. A right pelvic sidewall node measures 1.7 cm in short axis on image 105/2, formerly 1.4 cm. A left pelvic sidewall lymph node measures 2.0 cm in short axis on image 105/2, formerly 1.5 cm. Additional enlarged lymph nodes are present along these chains. An upper omental lymph node measures 0.7 cm in short axis on image 70/2 and was not well seen on the prior exam. Scattered mesenteric lymph nodes are present. Reproductive: Poor definition of the endometrium, nonspecific. Other: No supplemental non-categorized findings. Musculoskeletal: Chronic appearing superior endplate compression fracture at L1. Grade 1 degenerative anterolisthesis at L4-5 with associated disc bulge and suspected mild left foraminal stenosis at L4-5 due to disc uncovering and facet arthropathy. IMPRESSION: 1. Significant increased adenopathy in the chest, abdomen, and pelvis compared to the 10/28/2018 PET-CT. 2. The scattered tiny pulmonary nodules and larger single right lower lobe pulmonary nodule are all stable. 3. Other imaging findings of potential clinical significance: Aortic Atherosclerosis (ICD10-I70.0). Coronary atherosclerosis. Mild cardiomegaly. Mild diffuse thyroid prominence, stable. Mild left foraminal impingement at L4-5. Electronically Signed   By: Van Clines M.D.   On: 01/11/2019 09:16   Ct Abdomen Pelvis W Contrast  Result Date: 01/11/2019 CLINICAL DATA:  Ongoing  chemotherapy for chronic lymphocytic leukemia diagnosed in 2010. EXAM: CT CHEST, ABDOMEN, AND PELVIS WITH CONTRAST TECHNIQUE: Multidetector CT imaging of the chest, abdomen and pelvis was performed following the standard protocol during bolus administration of intravenous contrast. CONTRAST:  135mL OMNIPAQUE IOHEXOL 300 MG/ML  SOLN COMPARISON:  PET-CT from 10/28/2018 FINDINGS: CT CHEST FINDINGS Cardiovascular: Right Port-A-Cath tip: Cavoatrial junction. Left anterior descending and right coronary artery atherosclerosis. Mild cardiomegaly. Mediastinum/Nodes: Prominent bilateral axillary, subpectoral, supraclavicular, and mediastinal nodes are present.  An index right axillary node measures 2.8 cm in short axis on image 13/2 and formerly measured 1.8 cm in short axis. An index left axillary lymph node measures 2.0 cm in short axis on image 14/2 and was formerly 1.3 cm in short axis. A subcarinal node measures 1.7 cm in short axis on image 28/2, formerly 1.1 cm. Other bulky axillary and subpectoral lymph nodes are present. A supraclavicular node on the left measures 1.2 cm on image 4/2, formerly 0.8 cm. Mild diffuse prominence of the thyroid gland is stable. Lungs/Pleura: The scattered small nodules in both lungs are essentially stable. A dominant rounded nodule in the right lower lobe measures 1.7 by 1.3 cm on image 89/6, formerly 1.6 by 1.3 cm. Musculoskeletal: Unremarkable CT ABDOMEN PELVIS FINDINGS Hepatobiliary: Unremarkable.  Mildly contracted gallbladder. Pancreas: Unremarkable Spleen: Unremarkable.  No splenomegaly. Adrenals/Urinary Tract: Small hypodense lesions in the upper pole the right kidney are technically too small to characterize although statistically likely to be cysts. Adrenal glands normal. Stomach/Bowel: Unremarkable Vascular/Lymphatic: Aortoiliac atherosclerotic vascular disease. Pathologic adenopathy noted in the splenic, pancreatic, retroperitoneal, external iliac, common iliac, and pelvic  sidewall regions with smaller inguinal, mesenteric, and omental lymph nodes noted. A peripancreatic node measures 1.7 cm in short axis on image 56/2, formerly 1.1 cm. A right common iliac node measures 1.6 cm in short axis on image 91/2, formerly 1.3 cm. A right pelvic sidewall node measures 1.7 cm in short axis on image 105/2, formerly 1.4 cm. A left pelvic sidewall lymph node measures 2.0 cm in short axis on image 105/2, formerly 1.5 cm. Additional enlarged lymph nodes are present along these chains. An upper omental lymph node measures 0.7 cm in short axis on image 70/2 and was not well seen on the prior exam. Scattered mesenteric lymph nodes are present. Reproductive: Poor definition of the endometrium, nonspecific. Other: No supplemental non-categorized findings. Musculoskeletal: Chronic appearing superior endplate compression fracture at L1. Grade 1 degenerative anterolisthesis at L4-5 with associated disc bulge and suspected mild left foraminal stenosis at L4-5 due to disc uncovering and facet arthropathy. IMPRESSION: 1. Significant increased adenopathy in the chest, abdomen, and pelvis compared to the 10/28/2018 PET-CT. 2. The scattered tiny pulmonary nodules and larger single right lower lobe pulmonary nodule are all stable. 3. Other imaging findings of potential clinical significance: Aortic Atherosclerosis (ICD10-I70.0). Coronary atherosclerosis. Mild cardiomegaly. Mild diffuse thyroid prominence, stable. Mild left foraminal impingement at L4-5. Electronically Signed   By: Van Clines M.D.   On: 01/11/2019 09:16    Labs:  CBC: Recent Labs    11/12/18 0954 12/10/18 0900 01/06/19 0910 01/21/19 1103  WBC 2.8* 2.9* 2.2* 2.6*  HGB 10.9* 11.9* 11.1* 12.7  HCT 33.6* 37.2 34.2* 39.9  PLT 191 185 150 180    COAGS: Recent Labs    06/29/18 1310 01/21/19 1103  INR 0.95 1.1  APTT 27  --     BMP: Recent Labs    11/01/18 0903 11/12/18 0954 12/10/18 0900 01/06/19 0910  NA 145 144  141 143  K 2.9* 3.7 4.1 4.0  CL 108 109 109 109  CO2 26 25 26 25   GLUCOSE 101* 133* 113* 111*  BUN 11 8 10 13   CALCIUM 9.0 8.8* 9.5 9.0  CREATININE 0.72 0.76 0.85 0.76  GFRNONAA >60 >60 >60 >60  GFRAA >60 >60 >60 >60    LIVER FUNCTION TESTS: Recent Labs    11/01/18 0903 11/12/18 0954 12/10/18 0900 01/06/19 0910  BILITOT 1.3* 1.2 1.3* 0.8  AST  17 20 22 21   ALT 15 20 18 19   ALKPHOS 73 85 76 75  PROT 6.6 5.9* 6.6 6.3*  ALBUMIN 4.0 3.8 4.4 4.1    TUMOR MARKERS: No results for input(s): AFPTM, CEA, CA199, CHROMGRNA in the last 8760 hours.  Assessment and Plan: Lyphadenopathy Patient with past medical history of CLL presents with complaint of lymphadenopathy.  IR consulted for lymph node biopsy at the request of Dr. Alvy Bimler. Case reviewed by Dr. Anselm Pancoast who approves patient for procedure.  Patient presents today in their usual state of health.  She has been NPO and is not currently on blood thinners as she has held her Eliquis.   Risks and benefits of biopsy was discussed with the patient and/or patient's family including, but not limited to bleeding, infection, damage to adjacent structures or low yield requiring additional tests.  All of the questions were answered and there is agreement to proceed.  Consent signed and in chart.  Thank you for this interesting consult.  I greatly enjoyed meeting FELESIA STAHLECKER and look forward to participating in their care.  A copy of this report was sent to the requesting provider on this date.  Electronically Signed: Docia Barrier, PA 01/21/2019, 12:40 PM   I spent a total of  30 Minutes   in face to face in clinical consultation, greater than 50% of which was counseling/coordinating care for lymphadenopathy.

## 2019-01-21 NOTE — Discharge Instructions (Signed)

## 2019-01-21 NOTE — Telephone Encounter (Signed)
Left message re 4/21 f/u

## 2019-01-21 NOTE — Progress Notes (Signed)
Spoke with husband Randell via telephone and gave discharge instructions.  No questions at this time and Audrey Gross verbalized understanding of instructions

## 2019-01-21 NOTE — Procedures (Signed)
Interventional Radiology Procedure:   Indications: CLL with lymphadenopathy   Procedure: US guided right axillary lymph node biopsy  Findings: Several enlarged right axillary nodes.  6 cores obtained.    Complications: None     EBL: less than 10 ml  Plan: Discharge to home after sedation recovery.     Audrey Gross R. Anselm Pancoast, MD  Pager: [phone]

## 2019-01-25 ENCOUNTER — Other Ambulatory Visit: Payer: Self-pay

## 2019-01-25 ENCOUNTER — Inpatient Hospital Stay: Payer: BLUE CROSS/BLUE SHIELD | Admitting: Hematology and Oncology

## 2019-01-25 ENCOUNTER — Other Ambulatory Visit: Payer: Self-pay | Admitting: Hematology and Oncology

## 2019-01-25 ENCOUNTER — Telehealth: Payer: Self-pay

## 2019-01-25 ENCOUNTER — Telehealth: Payer: Self-pay | Admitting: Pharmacist

## 2019-01-25 ENCOUNTER — Telehealth: Payer: Self-pay | Admitting: Cardiology

## 2019-01-25 ENCOUNTER — Encounter: Payer: Self-pay | Admitting: Hematology and Oncology

## 2019-01-25 ENCOUNTER — Inpatient Hospital Stay (HOSPITAL_BASED_OUTPATIENT_CLINIC_OR_DEPARTMENT_OTHER): Payer: BLUE CROSS/BLUE SHIELD | Admitting: Hematology and Oncology

## 2019-01-25 VITALS — BP 149/88 | HR 51 | Temp 98.4°F | Resp 18 | Ht 67.0 in | Wt 200.8 lb

## 2019-01-25 DIAGNOSIS — D702 Other drug-induced agranulocytosis: Secondary | ICD-10-CM

## 2019-01-25 DIAGNOSIS — I10 Essential (primary) hypertension: Secondary | ICD-10-CM

## 2019-01-25 DIAGNOSIS — I482 Chronic atrial fibrillation, unspecified: Secondary | ICD-10-CM | POA: Diagnosis not present

## 2019-01-25 DIAGNOSIS — Z9221 Personal history of antineoplastic chemotherapy: Secondary | ICD-10-CM | POA: Diagnosis not present

## 2019-01-25 DIAGNOSIS — I429 Cardiomyopathy, unspecified: Secondary | ICD-10-CM | POA: Diagnosis not present

## 2019-01-25 DIAGNOSIS — R918 Other nonspecific abnormal finding of lung field: Secondary | ICD-10-CM | POA: Diagnosis not present

## 2019-01-25 DIAGNOSIS — C9110 Chronic lymphocytic leukemia of B-cell type not having achieved remission: Secondary | ICD-10-CM

## 2019-01-25 DIAGNOSIS — Z79899 Other long term (current) drug therapy: Secondary | ICD-10-CM | POA: Diagnosis not present

## 2019-01-25 DIAGNOSIS — Z7189 Other specified counseling: Secondary | ICD-10-CM

## 2019-01-25 DIAGNOSIS — Z5112 Encounter for antineoplastic immunotherapy: Secondary | ICD-10-CM | POA: Diagnosis not present

## 2019-01-25 DIAGNOSIS — I480 Paroxysmal atrial fibrillation: Secondary | ICD-10-CM

## 2019-01-25 MED ORDER — ACALABRUTINIB 100 MG PO CAPS: 100.0000 mg | ORAL_CAPSULE | Freq: Two times a day (BID) | ORAL | 11 refills | Status: DC

## 2019-01-25 NOTE — Assessment & Plan Note (Signed)
She will continue anticoagulation therapy and medical management as directed by her cardiologist I plan to repeat echocardiogram as baseline before we start her on treatment Clinically, she has no signs or symptoms of congestive heart failure

## 2019-01-25 NOTE — Telephone Encounter (Signed)
Routing to Dr. Curt Bears to advise

## 2019-01-25 NOTE — Assessment & Plan Note (Signed)
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

## 2019-01-25 NOTE — Telephone Encounter (Signed)
Called Dr. Calton Dach office to confirm need for echo (per Dr. Curt Bears).  Last one was 05/2018. Nurse at office said that depending on treatments pt may need one q37m. She is forwarding to Dr. Alvy Bimler for clarification/confirmation. Made aware that our office will be more than happy to complete echo as requested if physician still recommends.  (we can schedule pt this week/next if needed) Will await Dr. Calton Dach recommendation.

## 2019-01-25 NOTE — Progress Notes (Signed)
DISCONTINUE ON PATHWAY REGIMEN - Lymphoma and CLL     Ramp-up cycle = 35 days:     Venetoclax      Venetoclax      Venetoclax      Venetoclax      Venetoclax    Cycle 1 through 24 = every 28 days:     Venetoclax      Rituximab      Rituximab   **Always confirm dose/schedule in your pharmacy ordering system**  REASON: Disease Progression PRIOR TREATMENT: [id number]: Venetoclax 400 mg Daily for Up to 2 Years (Plus Initial 5-Week Dose Ramp-Up) + Rituximab 375/500 mg/m2 IV q28 Days x 6 Cycles TREATMENT RESPONSE: Progressive Disease (PD)  START OFF PATHWAY REGIMEN - Lymphoma and CLL   [id number]:Obinutuzumab IV D1,8,15 Cycle 1/D1 Cycles 2-6 + Chlorambucil PO D1,15 Cycles 1-6 q28 Days:   A cycle is every 28 days:     Obinutuzumab      Obinutuzumab      Obinutuzumab      Obinutuzumab      Chlorambucil   **Always confirm dose/schedule in your pharmacy ordering system**  Administration Notes: no Chlorambucil  Patient Characteristics: Chronic Lymphocytic Leukemia (CLL), Third Line, 17p del (+) Disease Type: Chronic Lymphocytic Leukemia (CLL) Disease Type: Not Applicable Disease Type: Not Applicable Line of Therapy: Third Line RAI Stage: IV 17p Deletion Status: Positive Intent of Therapy: Non-Curative / Palliative Intent, Discussed with Patient

## 2019-01-25 NOTE — Assessment & Plan Note (Signed)
The patient is aware she has incurable disease and treatment is strictly palliative. We discussed importance of Advanced Directives and Living will. 

## 2019-01-25 NOTE — Telephone Encounter (Signed)
Dr. Alvy Bimler - I apologize that Dr. Curt Bears nor I saw your note. I will have our office arrange echo for pt next week. Thanks

## 2019-01-25 NOTE — Telephone Encounter (Signed)
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  Original diagnosis in 2016 Patient received treatment with Imbruvica 2016-2018, which was discontinued secondary to treatment related atrial fibrillation Patient received treatment with bendamustine and rituximab starting in Sept 4560 that was complicated by numerous rituximab infusion related reactions, bendamustine continued as monotherapy and subsequently discontinued in Nov 2018 due to progression of disease. Patient initiated treatment with Venclexta in Dec 2018, rituximab was added back in Feb 2019 with slow infusion.  Patient now with disease progression and is under evaluation to initiate treatment with Calquence at 137m by mouth twice daily. GDyann Kiefmay be added in the future based on toleration and efficacy.  Labs [place] Epic [place], [place]  MD rechecked EKG today, noted sinus bradycardia Electrolytes and cardiac status will be closely monitored on treatment  Current medication list in Epic reviewed, noderate DDIs with Calquence and Eliquis identified:  Category C interaction: Acalabrutinib may enhance the anticoagulant effect of anticoagulants. Acalabrutinib action on platelet aggregation is less pronounced than that of ibrutinib. CBC check on 4/17 shows platelet count WNL, will continue to be monitored on treatment.  [facility] by [facility].  Oral Oncology Clinic will continue to follow for insurance authorization, copayment issues, initial counseling and start date.  JJohny Drilling PharmD, BCPS, BCOP  01/25/2019 11:26 AM Oral Oncology Clinic [phone]

## 2019-01-25 NOTE — Telephone Encounter (Signed)
The reason is outlined in my notes today I am switching her chemo; there's a risk of cardiomyopathy and worsening a Fib control while on chemo with Acalabrutinib

## 2019-01-25 NOTE — Telephone Encounter (Signed)
Oral Oncology Patient Advocate Encounter  Prior Authorization for Calquence has been approved.    PA# [id number] Effective dates: 01/25/19 through 01/25/20  Oral Oncology Clinic will continue to follow.   [place] Patient [place] Phone [phone] Fax [phone] 01/25/2019    3:22 PM

## 2019-01-25 NOTE — Telephone Encounter (Signed)
Oral Oncology Patient Advocate Encounter  Received notification from [employer] that prior authorization for Calquence is required.  PA submitted on CoverMyMeds Key [id number] Status is pending  Oral Oncology Clinic will continue to follow.  [place] Patient [place] Phone [phone] Fax [phone] 01/25/2019    2:09 PM

## 2019-01-25 NOTE — Telephone Encounter (Signed)
Oral Oncology Patient Advocate Encounter  Calquence copay is $150. I was able to secure a copay card to make the patients out of pocket cost $0. The copay card information is as follows and has been shared with [place]. BIN: [id number] PCN: [id number] Grp: [id number] ID: [id number]  [place] Patient [place] Phone [phone] Fax [phone] 01/25/2019   3:56 PM

## 2019-01-25 NOTE — Telephone Encounter (Signed)
Hi Nicole, see below

## 2019-01-25 NOTE — Telephone Encounter (Signed)
New Message:   We are not doing Echo here until June., unless Cardiologist thinks it is an emergency.   Audrey Gross, from Dr Grant Fontana office called. She wants pt to have an Echo here on 02-02-19.She needs this so she can have Chemo treatment. Does Dr Curt Bears consider this to be an emergency? Please let me know, I will take care of this.

## 2019-01-25 NOTE — Telephone Encounter (Signed)
Forwarding to [employer] to schedule echo for pt next week. Geisla: order is in [employer]

## 2019-01-25 NOTE — Assessment & Plan Note (Signed)
I have reviewed the recent pathology report with the patient and given her a copy I reviewed the current guidelines with her Despite recent disease progression, she is not symptomatic  I have reviewed the current [facility] guidelines and discussed treatment options with her Intent of treatment is palliative It is based on recent publication as outlined below  [facility]ATE [place]: Phase 3 Study of Acalabrutinib Combined with Obinutuzumab (O) or Alone Vs O Plus Chlorambucil (Clb) in Patients (Pts) with Treatment-Naive Chronic Lymphocytic Leukemia (CLL) by Donnetta Simpers, et al  Blood (2019) 134 (Supplement_1): 31.  [email or web address]   Background: Acalabrutinib is a highly selective, covalent irreversible Bruton tyrosine kinase inhibitor with minimal activity against other kinases. Acalabrutinib has demonstrated durable responses as a single agent or combined with O in treatment-nave (TN) CLL. Here, interim results are presented for the multicenter, open-label Phase 3 ELEVATE-TN study ([id number]), which evaluated the efficacy and safety of acalabrutinib + O vs acalabrutinib alone vs O + Clb in pts with TN CLL.  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 Pts were randomized 1:1:1 to receive oral acalabrutinib (100 mg twice daily continuously) alone or combined with intravenous O (1000 mg on Days 1, 2 [split 100/900], 8, and 15 of Cycle 2, and Day 1 of subsequent 28-day cycles for a total of 6 cycles), or O plus oral Clb (0.5 mg/kg on Days 1 and 15 of each 28-day cycle for 6 cycles). Pts were stratified by del(17p) status, ECOG status (?1 vs 2), and geographic region. The primary endpoint was independent review committee (IRC)-assessed progression-free survival (PFS) with acalabrutinib + O vs O + Clb. Key secondary endpoints included IRC-assessed PFS with  acalabrutinib vs O + Clb, IRC-assessed overall response rate (ORR), overall survival (OS), and safety. Minimal residual disease (MRD) in peripheral blood or bone marrow was assessed in pts with investigator-assessed complete response (CR)/CR with incomplete marrow recovery (CRi). Crossover to acalabrutinib monotherapy was allowed for pts in the O + Clb arm with IRC-confirmed progression.  Results: From 06/19/2014-11/14/2015, 535 pts were randomized to the acalabrutinib + O (n=179), acalabrutinib (n= 179), or O + Clb (n=177) arms. The median age was 31 y (range, 41-91); 69% had high- and 12% had very high-risk CLL IPI scores.  At a median follow-up of 28 mo, acalabrutinib + O significantly prolonged PFS vs O + Clb (median not reached [NR] vs 22.6 mo; HR 0.10, 95% CI 0.06-0.18, P<0.0001), reducing the risk of progression or death by 90% (Figure). Acalabrutinib (median NR) also prolonged PFS vs O + Clb (HR 0.20, 95% CI 0.13-0.31, P<0.0001). Estimated 30-mo PFS rates with acalabrutinib + O, acalabrutinib, and O + Clb were 90%, 82%, and 34%, respectively. PFS improvement with acalabrutinib + O or acalabrutinib vs O + Clb was consistent across subgroups examined including del(17p) (HR [95% CI]; 0.13 [0.04-0.46]; 0.20 [0.06-0.64]). Median OS was not reached in any arm; (HR [95% CI]; acalabrutinib + O vs O + Clb, 0.47 [0.21-1.06], P=0.0577; acalabrutinib vs O + Clb, 0.60 [0.28-1.27], P=0.1556). In the acalabrutinib + O, acalabrutinib, and O + Clb arms, the estimated 30-mo OS rates were 95%, 94%, and 90%, respectively. Five pts (3%) in the acalabrutinib + O arm, 11 pts (6%) in the acalabrutinib arm, and 55 pts (31%) in the O + Clb arm had received a next therapy; 45 pts (25%) in the O + Clb arm crossed over  to the acalabrutinib monotherapy arm.  IRC-assessed ORR was higher with acalabrutinib + O (94%; 95% CI, 89.3%-96.5%) vs O + Clb (79%; 95% CI, 71.9%-83.9%; P<0.0001); the ORR with acalabrutinib monotherapy was 85%. CR  rates were higher with acalabrutinib + O (13%) vs O + Clb (5%); there was 1 CR in the acalabrutinib monotherapy arm. MRD data will be presented. The median treatment duration was 27.7 mo for acalabrutinib + O (range, 2.3-40.3) and acalabrutinib (range, 0.3-40.2) and 5.6 mo (range, 0.9-7.4) for O + Clb. Common adverse events (AEs) are shown in the Table. AEs were similar between the acalabrutinib-containing arms. Infusion reactions were less frequent with acalabrutinib + O (13%) than with O + Clb (40%). AEs led to treatment discontinuation in 20 pts (11%) on acalabrutinib + O, 16 pts (9%) on acalabrutinib, and 25 pts (14%) on O + Clb. With >2 y of follow-up, 79.3% of pts in both the acalabrutinib-containing arms remain on single-agent acalabrutinib. AEs of interest (acalabrutinib + O or acalabrutinib vs O + Clb) were atrial fibrillation (any grade: 3% or 4% vs 1%), bleeding (any grade/Grade ?3: 43%/2% or 39%/2% vs 12%/0%), and hypertension (Grade ?3: 3% or 2% vs 3%).  Conclusions: Acalabrutinib + O and acalabrutinib monotherapy significantly improved PFS vs O + Clb, with tolerable safety in pts with TN CLL. Despite cross over for disease progression in the O + Clb arm, a trend toward improved OS was observed in both acalabrutinib arms, though longer follow-up is needed.  We discussed some of the risks, benefits and side-effects of Acalabrutinib and obinutuzumab Treatment intent is palliative  Some of the short term side-effects included, though not limited to, risk of fatigue, atrial fibrillation, weight loss, tumor lysis syndrome, risk of allergic reactions, pancytopenia, life-threatening infections, need for transfusions of blood products, nausea, vomiting, change in bowel habits, admission to hospital for various reasons, and risks of death.   Long term side-effects are also discussed including permanent damage to nerve function, chronic fatigue, and rare secondary malignancy including bone marrow  disorders.   The patient is aware that the response rates discussed earlier is not guaranteed.    After a long discussion, patient made an informed decision to proceed with the prescribed plan of care.   Patient education material was dispensed I will get assistance from the pharmacist to get insurance prior authorization Due to her history of chronic atrial fibrillation and history of cardiomyopathy, I plan to repeat echocardiogram for baseline Repeat ECG today is reviewed and compared with prior ECG from January and they are stable

## 2019-01-25 NOTE — Progress Notes (Signed)
[facility] OFFICE PROGRESS NOTE  Patient Care Team: Janith Lima, MD as PCP - General (Internal Medicine) Constance Haw, MD as PCP - Electrophysiology (Cardiology) Constance Haw, MD as Consulting Physician (Cardiology)  ASSESSMENT & PLAN:  CLL (chronic lymphocytic leukemia) ([place]) I have reviewed the recent pathology report with the patient and given her a copy I reviewed the current guidelines with her Despite recent disease progression, she is not symptomatic  I have reviewed the current NCCN guidelines and discussed treatment options with her Intent of treatment is palliative It is based on recent publication as outlined below  ELEVATE [place]: Phase 3 Study of Acalabrutinib Combined with Obinutuzumab (O) or Alone Vs O Plus Chlorambucil (Clb) in Patients (Pts) with Treatment-Naive Chronic Lymphocytic Leukemia (CLL) by Donnetta Simpers, et al  Blood (2019) 134 (Supplement_1): 31.  [email or web address]   Background: Acalabrutinib is a highly selective, covalent irreversible Bruton tyrosine kinase inhibitor with minimal activity against other kinases. Acalabrutinib has demonstrated durable responses as a single agent or combined with O in treatment-nave (TN) CLL. Here, interim results are presented for the multicenter, open-label Phase 3 ELEVATE-TN study ([id number]), which evaluated the efficacy and safety of acalabrutinib + O vs acalabrutinib alone vs O + Clb in pts with TN CLL.  Methods: Eligible pts had TN CLL requiring treatment per iwCLL criteria and were aged ?58 y or <58 y with coexisting conditions (CIRS score >6, creatinine clearance <70 mL/min). Pts were randomized 1:1:1 to receive oral acalabrutinib (100 mg twice daily continuously) alone or combined with intravenous O (1000 mg on Days 1, 2 [split 100/900], 8, and 15 of Cycle 2, and Day 1 of subsequent 28-day cycles for a total of 6 cycles), or O plus oral Clb (0.5 mg/kg on Days 1 and 15 of  each 28-day cycle for 6 cycles). Pts were stratified by del(17p) status, ECOG status (?1 vs 2), and geographic region. The primary endpoint was independent review committee (IRC)-assessed progression-free survival (PFS) with acalabrutinib + O vs O + Clb. Key secondary endpoints included IRC-assessed PFS with acalabrutinib vs O + Clb, IRC-assessed overall response rate (ORR), overall survival (OS), and safety. Minimal residual disease (MRD) in peripheral blood or bone marrow was assessed in pts with investigator-assessed complete response (CR)/CR with incomplete marrow recovery (CRi). Crossover to acalabrutinib monotherapy was allowed for pts in the O + Clb arm with IRC-confirmed progression.  Results: From 06/19/2014-11/14/2015, 535 pts were randomized to the acalabrutinib + O (n=179), acalabrutinib (n= 179), or O + Clb (n=177) arms. The median age was 58 y (range, 41-91); 69% had high- and 12% had very high-risk CLL IPI scores.  At a median follow-up of 28 mo, acalabrutinib + O significantly prolonged PFS vs O + Clb (median not reached [NR] vs 22.6 mo; HR 0.10, 95% CI 0.06-0.18, P<0.0001), reducing the risk of progression or death by 90% (Figure). Acalabrutinib (median NR) also prolonged PFS vs O + Clb (HR 0.20, 95% CI 0.13-0.31, P<0.0001). Estimated 30-mo PFS rates with acalabrutinib + O, acalabrutinib, and O + Clb were 90%, 82%, and 34%, respectively. PFS improvement with acalabrutinib + O or acalabrutinib vs O + Clb was consistent across subgroups examined including del(17p) (HR [95% CI]; 0.13 [0.04-0.46]; 0.20 [0.06-0.64]). Median OS was not reached in any arm; (HR [95% CI]; acalabrutinib + O vs O + Clb, 0.47 [0.21-1.06], P=0.0577; acalabrutinib vs O + Clb, 0.60 [0.28-1.27], P=0.1556). In the acalabrutinib + O, acalabrutinib, and O + Clb arms, the estimated 30-mo OS rates  were 95%, 94%, and 90%, respectively. Five pts (3%) in the acalabrutinib + O arm, 11 pts (6%) in the acalabrutinib arm, and 55 pts (31%) in  the O + Clb arm had received a next therapy; 45 pts (25%) in the O + Clb arm crossed over to the acalabrutinib monotherapy arm.  IRC-assessed ORR was higher with acalabrutinib + O (94%; 95% CI, 89.3%-96.5%) vs O + Clb (79%; 95% CI, 71.9%-83.9%; P<0.0001); the ORR with acalabrutinib monotherapy was 85%. CR rates were higher with acalabrutinib + O (13%) vs O + Clb (5%); there was 1 CR in the acalabrutinib monotherapy arm. MRD data will be presented. The median treatment duration was 27.7 mo for acalabrutinib + O (range, 2.3-40.3) and acalabrutinib (range, 0.3-40.2) and 5.6 mo (range, 0.9-7.4) for O + Clb. Common adverse events (AEs) are shown in the Table. AEs were similar between the acalabrutinib-containing arms. Infusion reactions were less frequent with acalabrutinib + O (13%) than with O + Clb (40%). AEs led to treatment discontinuation in 20 pts (11%) on acalabrutinib + O, 16 pts (9%) on acalabrutinib, and 25 pts (14%) on O + Clb. With >2 y of follow-up, 79.3% of pts in both the acalabrutinib-containing arms remain on single-agent acalabrutinib. AEs of interest (acalabrutinib + O or acalabrutinib vs O + Clb) were atrial fibrillation (any grade: 3% or 4% vs 1%), bleeding (any grade/Grade ?3: 43%/2% or 39%/2% vs 12%/0%), and hypertension (Grade ?3: 3% or 2% vs 3%).  Conclusions: Acalabrutinib + O and acalabrutinib monotherapy significantly improved PFS vs O + Clb, with tolerable safety in pts with TN CLL. Despite cross over for disease progression in the O + Clb arm, a trend toward improved OS was observed in both acalabrutinib arms, though longer follow-up is needed.  We discussed some of the risks, benefits and side-effects of Acalabrutinib and obinutuzumab Treatment intent is palliative  Some of the short term side-effects included, though not limited to, risk of fatigue, atrial fibrillation, weight loss, tumor lysis syndrome, risk of allergic reactions, pancytopenia, life-threatening infections,  need for transfusions of blood products, nausea, vomiting, change in bowel habits, admission to hospital for various reasons, and risks of death.   Long term side-effects are also discussed including permanent damage to nerve function, chronic fatigue, and rare secondary malignancy including bone marrow disorders.   The patient is aware that the response rates discussed earlier is not guaranteed.    After a long discussion, patient made an informed decision to proceed with the prescribed plan of care.   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 We discussed importance of Advanced Directives and Living will.  PAF (paroxysmal atrial fibrillation) ([place]) She will continue anticoagulation therapy and medical management as directed by her cardiologist I plan to repeat echocardiogram as baseline before we start her on treatment Clinically, she has no signs or symptoms of congestive heart failure   Orders Placed This Encounter  Procedures  . ECHOCARDIOGRAM COMPLETE    Standing Status:   Future  Standing Expiration Date:   04/25/2020    Order Specific Question:   Where should this test be performed    Answer:   [facility])    Order Specific Question:   Does the patient weigh less than or greater than 250 lbs?    Answer:   Patient weighs less than 250 lbs    Order Specific Question:    Perflutren DEFINITY (image enhancing agent) should be administered unless hypersensitivity or allergy exist    Answer:   Administer Perflutren    Order Specific Question:   Reason for exam-Echo    Answer:   Atrial Fibrillation  427.31 / I48.91    INTERVAL HISTORY: Please see below for problem oriented charting. She returns for further follow-up and review test results She tolerated recent biopsy well No new lymphadenopathy No recent infection, fever or chills Denies cough, chest pain or shortness of breath  SUMMARY OF ONCOLOGIC HISTORY:   CLL (chronic lymphocytic leukemia) ([place])   04/05/2015 Pathology Results    Accession: [id number] flow cytometry confirmed CLL. FISH was positive for p53 mutation    04/24/2015 Imaging    Extensive lymphadenopathy throughout the neck, chest (axilla), abdomen and pelvis, as detailed above, compatible with the reported clinical history of lymphoma. 2. Mild splenomegaly.    05/03/2015 - 08/27/2015 Chemotherapy    She started on Ibrutinib, discontinued prematurely when her prescription ran out    10/10/2015 - 05/29/2017 Chemotherapy    She was restarted back on Ibrutinib    11/13/2016 PET scan    Significant generalized reduction in size of numerous lymph nodes in the neck, chest, abdomen, and pelvis. Previously the activity of these nodes was low-level and in general a similar low-level activity is present today, significantly less than mediastinal blood pool activity, compatible with Deauville 2. 2. Coronary atherosclerosis. Mild cardiomegaly. 3. Mildly prominent endometrium for age without accentuated metabolic activity in the endometrium. Consider pelvic sonography for further characterization.    05/27/2017 PET scan    1. Progressive hypermetabolic adenopathy, primarily involving cervical, axillary, pelvic and inguinal lymph nodes bilaterally, consistent with progressive lymphoma. 2. No solid visceral organ or osseous involvement.    06/16/2017 -  08/25/2017 Chemotherapy    The patient had 3 cycles of Rituximab and Bendamustine    07/15/2017 - 07/19/2017 Hospital Admission    The patient was briefly admitted to the hospital due to infusion reaction to rituximab    09/18/2017 PET scan    1. Continued considerable adenopathy in the neck, chest, abdomen, and pelvis. This is generally stable in size but mildly reduced in activity compared to the prior exam. Current levels of activity primarily Deauville 3 and Deauville 4. No splenomegaly. 2. Diffuse new ground-glass opacities in the lungs with associated hypermetabolic activity. Some forms of lymphoma infiltration can rarely cause this pattern of diffuse ground-glass opacity and hypermetabolic activity. Differential diagnostic considerations might include atypical pneumonia such as mycoplasma, acute hypersensitivity pneumonitis, or acute eosinophilic pneumonia. Pulmonary hemorrhage seems less likely to cause this degree of accentuated metabolic activity.  3. Aortic Atherosclerosis (ICD10-I70.0). Coronary atherosclerosis.    09/24/2017 -  Chemotherapy    The patient had ventoclax for chemotherapy treatment.  Rituximab is added on 11/18/17 to 04/09/18, x 6 cycles    11/19/2017 PET scan    Overall mild interval decrease in hypermetabolic lymphadenopathy throughout the neck, chest, abdomen, and pelvis. No new or increased lymphadenopathy identified.  While there has been resolution of diffuse hypermetabolic bilateral ground-glass pulmonary  opacity since prior study, there is a new 14 mm hypermetabolic pulmonary nodule in the posterior right lower lobe. Time course favors inflammatory or infectious etiology over neoplasm. Recommend continued follow-up by chest CT in 3 months.    02/16/2018 PET scan    1. Adenopathy in the neck, chest, and pelvis is stable to minimally reduced in size, and is moderately reduced in activity, primarily Deauville 2 disease today. There is a right common iliac lymph node  qualifying as Deauville 3 disease which is stable in size but reduced in activity. 2. Stable size but reduced activity in a pulmonary nodule in the right lower lobe. If this represents a leukemic lesion then a corresponds to Deauville 4 disease. This lesion was not readily apparent on 09/22/2017 but was reported on the prior chest CT of 11/19/2017. 3. Other imaging findings of potential clinical significance: Mild thyroid goiter. Aortic Atherosclerosis (ICD10-I70.0). Coronary atherosclerosis. Mild cardiomegaly.    04/19/2018 PET scan    1. Interval mild mixed metabolic changes, as detailed. Persistent mildly hypermetabolic bilateral axillary, mediastinal and bilateral pelvic adenopathy and mildly hypermetabolic right lower lobe pulmonary nodule compatible with lymphoproliferative disorder. Deauville 4 based on the subcarinal node. 2. Aortic Atherosclerosis (ICD10-I70.0).    06/29/2018 Procedure    Successful placement of a right internal jugular approach power injectable Port-A-Cath. The catheter is ready for immediate use.    07/26/2018 PET scan    1. Adenopathy primarily in the chest and pelvis as noted above, with size of the mildly enlarged lymph nodes stable to minimally increased, but with nodal activity generally decreased compared to the prior exam. Uptake in these nodes is primarily Deauville 2. 2. Aortic Atherosclerosis (ICD10-I70.0). Coronary atherosclerosis with mild cardiomegaly.    10/29/2018 PET scan    1. Relatively stable sized lymphadenopathy but interval increase in hypermetabolism when compared to the most recent prior PET-CT, as detailed above. 2. No new disease is identified. 3. Stable bilateral pulmonary nodules.    11/12/2018 - 02/02/2019 Chemotherapy    The patient had riTUXimab (RITUXAN) 800 mg in sodium chloride 0.9 % 170 mL infusion, 375 mg/m2 = 800 mg, Intravenous,  Once, 3 of 4 cycles Administration: 800 mg (11/12/2018), 800 mg (12/10/2018), 800 mg (01/06/2019)  for  chemotherapy treatment.     01/11/2019 Imaging    1. Significant increased adenopathy in the chest, abdomen, and pelvis compared to the 10/28/2018 PET-CT. 2. The scattered tiny pulmonary nodules and larger single right lower lobe pulmonary nodule are all stable. 3. Other imaging findings of potential clinical significance: Aortic Atherosclerosis (ICD10-I70.0). Coronary atherosclerosis. Mild cardiomegaly. Mild diffuse thyroid prominence, stable. Mild left foraminal impingement at L4-5.    01/21/2019 Procedure    Successful ultrasound-guided core biopsies of an enlarged right axillary lymph node.     01/21/2019 Pathology Results    Lymph node, needle/core biopsy, Right Axilla - NON-HODGKIN B-CELL LYMPHOMA CONSISTENT WITH CHRONIC LYMPHOCYTIC LEUKEMIA/SMALL LYMPHOCYTIC LYMPHOMA - SEE COMMENT Microscopic Comment The biopsies are small core biopsies composed of a monotonous population of lymphocytes which are positive for CD20 without expression of cyclin-D1. The latter is important because it essentially rules out the possibility of mantle cell lymphoma. CD3 highlights a small population of T-cells. By flow cytometry, a kappa-restricted monoclonal B-cell population that expresses CD19, CD20, CD5, and CD23 comprises 89% of all lymphocytes. Overall, the features are consistent with chronic lymphocytic leukemia/small lymphocytic lymphoma.    02/03/2019 -  Chemotherapy    The patient had obinutuzumab (GAZYVA) 100 mg in sodium  chloride 0.9 % 100 mL (0.9615 mg/mL) chemo infusion, 100 mg, Intravenous, Once, 0 of 6 cycles  for chemotherapy treatment.      REVIEW OF SYSTEMS:   Constitutional: Denies fevers, chills or abnormal weight loss Eyes: Denies blurriness of vision Ears, nose, mouth, throat, and face: Denies mucositis or sore throat Respiratory: Denies cough, dyspnea or wheezes Cardiovascular: Denies palpitation, chest discomfort or lower extremity swelling Gastrointestinal:  Denies nausea,  heartburn or change in bowel habits Skin: Denies abnormal skin rashes Lymphatics: Denies new lymphadenopathy or easy bruising Neurological:Denies numbness, tingling or new weaknesses Behavioral/Psych: Mood is stable, no new changes  All other systems were reviewed with the patient and are negative.  I have reviewed the past medical history, past surgical history, social history and family history with the patient and they are unchanged from previous note.  ALLERGIES:  has No Known Allergies.  MEDICATIONS:  Current Outpatient Medications  Medication Sig Dispense Refill  . Acalabrutinib 100 MG CAPS Take 100 mg by mouth 2 (two) times daily. 60 capsule 11  . allopurinol (ZYLOPRIM) 300 MG tablet Take 1 tablet (300 mg total) by mouth daily. 30 tablet 3  . Cholecalciferol (VITAMIN D-3) 1000 units CAPS Take 1,000 Units by mouth daily with breakfast.    . ELIQUIS 5 MG TABS tablet TAKE 1 TABLET BY MOUTH TWICE A DAY 180 tablet 1  . furosemide (LASIX) 20 MG tablet TAKE 1 TABLET BY MOUTH EVERY DAY AS NEEDED 90 tablet 2  . lidocaine-prilocaine (EMLA) cream Apply 1 application topically as needed. 30 g 6  . metoprolol succinate (TOPROL-XL) 100 MG 24 hr tablet Take 1 1/2 tablets (150 mg total) daily. Take with or immediately following a meal. 135 tablet 2  . potassium chloride SA (K-DUR,KLOR-CON) 20 MEQ tablet Take 1 tablet (20 mEq total) by mouth daily. 30 tablet 11  . sacubitril-valsartan (ENTRESTO) 24-26 MG Take 1 tablet by mouth 2 (two) times daily. 180 tablet 3   No current facility-administered medications for this visit.     PHYSICAL EXAMINATION: ECOG PERFORMANCE STATUS: 1 - Symptomatic but completely ambulatory  Vitals:   01/25/19 1115  BP: (!) 149/88  Pulse: (!) 51  Resp: 18  Temp: 98.4 F (36.9 C)  SpO2: 100%   Filed Weights   01/25/19 1115  Weight: 200 lb 12.8 oz (91.1 kg)    GENERAL:alert, no distress and comfortable Musculoskeletal:no cyanosis of digits and no clubbing   NEURO: alert & oriented x 3 with fluent speech, no focal motor/sensory deficits  LABORATORY DATA:  I have reviewed the data as listed    Component Value Date/Time   NA 143 01/06/2019 0910   NA 140 10/09/2017 1416   K 4.0 01/06/2019 0910   K 4.4 10/09/2017 1416   CL 109 01/06/2019 0910   CL 105 01/18/2013 0912   CO2 25 01/06/2019 0910   CO2 25 10/09/2017 1416   GLUCOSE 111 (H) 01/06/2019 0910   GLUCOSE 117 10/09/2017 1416   GLUCOSE 83 01/18/2013 0912   BUN 13 01/06/2019 0910   BUN 11.1 10/09/2017 1416   CREATININE 0.76 01/06/2019 0910   CREATININE 0.72 11/01/2018 0903   CREATININE 0.8 10/09/2017 1416   CALCIUM 9.0 01/06/2019 0910   CALCIUM 9.6 10/09/2017 1416   PROT 6.3 (L) 01/06/2019 0910   PROT 6.4 10/09/2017 1416   ALBUMIN 4.1 01/06/2019 0910   ALBUMIN 3.4 (L) 10/09/2017 1416   AST 21 01/06/2019 0910   AST 17 11/01/2018 0903   AST 18 10/09/2017  1416   ALT 19 01/06/2019 0910   ALT 15 11/01/2018 0903   ALT 19 10/09/2017 1416   ALKPHOS 75 01/06/2019 0910   ALKPHOS 69 10/09/2017 1416   BILITOT 0.8 01/06/2019 0910   BILITOT 1.3 (H) 11/01/2018 0903   BILITOT 0.57 10/09/2017 1416   GFRNONAA >60 01/06/2019 0910   GFRNONAA >60 11/01/2018 0903   GFRAA >60 01/06/2019 0910   GFRAA >60 11/01/2018 0903    No results found for: SPEP, UPEP  Lab Results  Component Value Date   WBC 2.6 (L) 01/21/2019   NEUTROABS 0.7 (L) 01/06/2019   HGB 12.7 01/21/2019   HCT 39.9 01/21/2019   MCV 88.9 01/21/2019   PLT 180 01/21/2019      Chemistry      Component Value Date/Time   NA 143 01/06/2019 0910   NA 140 10/09/2017 1416   K 4.0 01/06/2019 0910   K 4.4 10/09/2017 1416   CL 109 01/06/2019 0910   CL 105 01/18/2013 0912   CO2 25 01/06/2019 0910   CO2 25 10/09/2017 1416   BUN 13 01/06/2019 0910   BUN 11.1 10/09/2017 1416   CREATININE 0.76 01/06/2019 0910   CREATININE 0.72 11/01/2018 0903   CREATININE 0.8 10/09/2017 1416      Component Value Date/Time   CALCIUM 9.0  01/06/2019 0910   CALCIUM 9.6 10/09/2017 1416   ALKPHOS 75 01/06/2019 0910   ALKPHOS 69 10/09/2017 1416   AST 21 01/06/2019 0910   AST 17 11/01/2018 0903   AST 18 10/09/2017 1416   ALT 19 01/06/2019 0910   ALT 15 11/01/2018 0903   ALT 19 10/09/2017 1416   BILITOT 0.8 01/06/2019 0910   BILITOT 1.3 (H) 11/01/2018 0903   BILITOT 0.57 10/09/2017 1416       RADIOGRAPHIC STUDIES: I have personally reviewed the radiological images as listed and agreed with the findings in the report. Ct Chest W Contrast  Result Date: 01/11/2019 CLINICAL DATA:  Ongoing chemotherapy for chronic lymphocytic leukemia diagnosed in 2010. EXAM: CT CHEST, ABDOMEN, AND PELVIS WITH CONTRAST TECHNIQUE: Multidetector CT imaging of the chest, abdomen and pelvis was performed following the standard protocol during bolus administration of intravenous contrast. CONTRAST:  147m OMNIPAQUE IOHEXOL 300 MG/ML  SOLN COMPARISON:  PET-CT from 10/28/2018 FINDINGS: CT CHEST FINDINGS Cardiovascular: Right Port-A-Cath tip: Cavoatrial junction. Left anterior descending and right coronary artery atherosclerosis. Mild cardiomegaly. Mediastinum/Nodes: Prominent bilateral axillary, subpectoral, supraclavicular, and mediastinal nodes are present. An index right axillary node measures 2.8 cm in short axis on image 13/2 and formerly measured 1.8 cm in short axis. An index left axillary lymph node measures 2.0 cm in short axis on image 14/2 and was formerly 1.3 cm in short axis. A subcarinal node measures 1.7 cm in short axis on image 28/2, formerly 1.1 cm. Other bulky axillary and subpectoral lymph nodes are present. A supraclavicular node on the left measures 1.2 cm on image 4/2, formerly 0.8 cm. Mild diffuse prominence of the thyroid gland is stable. Lungs/Pleura: The scattered small nodules in both lungs are essentially stable. A dominant rounded nodule in the right lower lobe measures 1.7 by 1.3 cm on image 89/6, formerly 1.6 by 1.3 cm.  Musculoskeletal: Unremarkable CT ABDOMEN PELVIS FINDINGS Hepatobiliary: Unremarkable.  Mildly contracted gallbladder. Pancreas: Unremarkable Spleen: Unremarkable.  No splenomegaly. Adrenals/Urinary Tract: Small hypodense lesions in the upper pole the right kidney are technically too small to characterize although statistically likely to be cysts. Adrenal glands normal. Stomach/Bowel: Unremarkable Vascular/Lymphatic: Aortoiliac atherosclerotic  vascular disease. Pathologic adenopathy noted in the splenic, pancreatic, retroperitoneal, external iliac, common iliac, and pelvic sidewall regions with smaller inguinal, mesenteric, and omental lymph nodes noted. A peripancreatic node measures 1.7 cm in short axis on image 56/2, formerly 1.1 cm. A right common iliac node measures 1.6 cm in short axis on image 91/2, formerly 1.3 cm. A right pelvic sidewall node measures 1.7 cm in short axis on image 105/2, formerly 1.4 cm. A left pelvic sidewall lymph node measures 2.0 cm in short axis on image 105/2, formerly 1.5 cm. Additional enlarged lymph nodes are present along these chains. An upper omental lymph node measures 0.7 cm in short axis on image 70/2 and was not well seen on the prior exam. Scattered mesenteric lymph nodes are present. Reproductive: Poor definition of the endometrium, nonspecific. Other: No supplemental non-categorized findings. Musculoskeletal: Chronic appearing superior endplate compression fracture at L1. Grade 1 degenerative anterolisthesis at L4-5 with associated disc bulge and suspected mild left foraminal stenosis at L4-5 due to disc uncovering and facet arthropathy. IMPRESSION: 1. Significant increased adenopathy in the chest, abdomen, and pelvis compared to the 10/28/2018 PET-CT. 2. The scattered tiny pulmonary nodules and larger single right lower lobe pulmonary nodule are all stable. 3. Other imaging findings of potential clinical significance: Aortic Atherosclerosis (ICD10-I70.0). Coronary  atherosclerosis. Mild cardiomegaly. Mild diffuse thyroid prominence, stable. Mild left foraminal impingement at L4-5. Electronically Signed   By: Van Clines M.D.   On: 01/11/2019 09:16   Ct Abdomen Pelvis W Contrast  Result Date: 01/11/2019 CLINICAL DATA:  Ongoing chemotherapy for chronic lymphocytic leukemia diagnosed in 2010. EXAM: CT CHEST, ABDOMEN, AND PELVIS WITH CONTRAST TECHNIQUE: Multidetector CT imaging of the chest, abdomen and pelvis was performed following the standard protocol during bolus administration of intravenous contrast. CONTRAST:  143m OMNIPAQUE IOHEXOL 300 MG/ML  SOLN COMPARISON:  PET-CT from 10/28/2018 FINDINGS: CT CHEST FINDINGS Cardiovascular: Right Port-A-Cath tip: Cavoatrial junction. Left anterior descending and right coronary artery atherosclerosis. Mild cardiomegaly. Mediastinum/Nodes: Prominent bilateral axillary, subpectoral, supraclavicular, and mediastinal nodes are present. An index right axillary node measures 2.8 cm in short axis on image 13/2 and formerly measured 1.8 cm in short axis. An index left axillary lymph node measures 2.0 cm in short axis on image 14/2 and was formerly 1.3 cm in short axis. A subcarinal node measures 1.7 cm in short axis on image 28/2, formerly 1.1 cm. Other bulky axillary and subpectoral lymph nodes are present. A supraclavicular node on the left measures 1.2 cm on image 4/2, formerly 0.8 cm. Mild diffuse prominence of the thyroid gland is stable. Lungs/Pleura: The scattered small nodules in both lungs are essentially stable. A dominant rounded nodule in the right lower lobe measures 1.7 by 1.3 cm on image 89/6, formerly 1.6 by 1.3 cm. Musculoskeletal: Unremarkable CT ABDOMEN PELVIS FINDINGS Hepatobiliary: Unremarkable.  Mildly contracted gallbladder. Pancreas: Unremarkable Spleen: Unremarkable.  No splenomegaly. Adrenals/Urinary Tract: Small hypodense lesions in the upper pole the right kidney are technically too small to characterize  although statistically likely to be cysts. Adrenal glands normal. Stomach/Bowel: Unremarkable Vascular/Lymphatic: Aortoiliac atherosclerotic vascular disease. Pathologic adenopathy noted in the splenic, pancreatic, retroperitoneal, external iliac, common iliac, and pelvic sidewall regions with smaller inguinal, mesenteric, and omental lymph nodes noted. A peripancreatic node measures 1.7 cm in short axis on image 56/2, formerly 1.1 cm. A right common iliac node measures 1.6 cm in short axis on image 91/2, formerly 1.3 cm. A right pelvic sidewall node measures 1.7 cm in short axis on image  105/2, formerly 1.4 cm. A left pelvic sidewall lymph node measures 2.0 cm in short axis on image 105/2, formerly 1.5 cm. Additional enlarged lymph nodes are present along these chains. An upper omental lymph node measures 0.7 cm in short axis on image 70/2 and was not well seen on the prior exam. Scattered mesenteric lymph nodes are present. Reproductive: Poor definition of the endometrium, nonspecific. Other: No supplemental non-categorized findings. Musculoskeletal: Chronic appearing superior endplate compression fracture at L1. Grade 1 degenerative anterolisthesis at L4-5 with associated disc bulge and suspected mild left foraminal stenosis at L4-5 due to disc uncovering and facet arthropathy. IMPRESSION: 1. Significant increased adenopathy in the chest, abdomen, and pelvis compared to the 10/28/2018 PET-CT. 2. The scattered tiny pulmonary nodules and larger single right lower lobe pulmonary nodule are all stable. 3. Other imaging findings of potential clinical significance: Aortic Atherosclerosis (ICD10-I70.0). Coronary atherosclerosis. Mild cardiomegaly. Mild diffuse thyroid prominence, stable. Mild left foraminal impingement at L4-5. Electronically Signed   By: Van Clines M.D.   On: 01/11/2019 09:16   [place] Axillary Node Core Biopsy Right  Result Date: 01/21/2019 INDICATION: 57 year old with CLL. Patient has enlarged  lymph nodes and concern for lymphoma. EXAM: ULTRASOUND-GUIDED RIGHT AXILLARY LYMPH NODE BIOPSY MEDICATIONS: None. ANESTHESIA/SEDATION: Moderate (conscious) sedation was employed during this procedure. A total of Versed 0.5 mg and Fentanyl 50 mcg was administered intravenously. Moderate Sedation Time: 17 minutes. The patient's level of consciousness and vital signs were monitored continuously by radiology nursing throughout the procedure under my direct supervision. FLUOROSCOPY TIME:  None COMPLICATIONS: None immediate. PROCEDURE: Informed written consent was obtained from the patient after a thorough discussion of the procedural risks, benefits and alternatives. All questions were addressed. A timeout was performed prior to the initiation of the procedure. Ultrasound demonstrated multiple enlarged lymph nodes in the right axilla. Right axilla was prepped chlorhexidine and sterile field was created. Skin and soft tissues were anesthetized with 1% lidocaine. 18 gauge core needle was directed into a large right axillary lymph node with ultrasound guidance. A total of 6 core biopsies were obtained. Specimens placed in saline. Bandage placed over the puncture site. FINDINGS: Multiple enlarged right axillary lymph nodes. No bleeding or hematoma formation following the core biopsies. IMPRESSION: Successful ultrasound-guided core biopsies of an enlarged right axillary lymph node. Electronically Signed   By: Markus Daft M.D.   On: 01/21/2019 16:59    All questions were answered. The patient knows to call the clinic with any problems, questions or concerns. No barriers to learning was detected.  I spent 40 minutes counseling the patient face to face. The total time spent in the appointment was 55 minutes and more than 50% was on counseling and review of test results  Heath Lark, MD 01/25/2019 2:09 PM

## 2019-01-26 ENCOUNTER — Telehealth: Payer: Self-pay | Admitting: Hematology and Oncology

## 2019-01-26 NOTE — Telephone Encounter (Signed)
Scheduled appts per 4/20 sch msg. Waiting for 4/30 infusion appt approval. Called and left msg for patient about dates and details of appts.

## 2019-01-27 NOTE — Telephone Encounter (Signed)
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   Counseled patient on administration, dosing, side effects, monitoring, drug-food interactions, safe handling, storage, and disposal.  Patient will take Calquence 177m capsules, 1 capsule by mouth approximately 12 hours apart, with or with out food, with a glass of water.  Patient knows to avoid acid suppressing agents, if needed, may seperate use of an H2RA blocker or antacid by 2 hours from Calquence admisinstration.  Calquence start date: 01/30/19 or 02/01/19  Adverse effects include but are not limited to: headache, diarrhea, fatigue, rash, muscle pain, bruising, decreased blood counts, and altered cardiac conduction.    Ms. DBridgewateris very concerned about the medication's effect on her heart and her blood counts.  Reviewed with patient importance of keeping a medication schedule and plan for any missed doses.  Ms. DHarnackvoiced understanding and appreciation.   All questions answered. Medication reconciliation performed and medication/allergy list updated.  Insurance authorization for [employer]has been obtained. Test claim at the pharmacy revealed copayment $150 for 1st fill of Calquence. Oral Oncology patient advocate has secured manufacturer copayment card to reduce patient's out of pocket expense for Calquence at the pharmacy to $0  [facility] to deliver to patient's home on 4/25 or 4/27 ([employer] can deliver on Saturdays on some routes but Saturday delivery is not guaranteed).  Patient informed the pharmacy will reach out 5-7 days prior to needing next fill of Calquence to coordinate continued medication  acquisition to prevent break in therapy.  She is used to this practice with previous medications [facility]  Patient knows to call the office with questions or concerns. Oral Oncology Clinic will continue to follow.  JJohny Drilling PharmD, BCPS, BCOP  01/27/2019   1:47 PM Oral Oncology Clinic [phone]

## 2019-01-28 ENCOUNTER — Ambulatory Visit (INDEPENDENT_AMBULATORY_CARE_PROVIDER_SITE_OTHER): Payer: BLUE CROSS/BLUE SHIELD | Admitting: Internal Medicine

## 2019-01-28 ENCOUNTER — Telehealth: Payer: Self-pay

## 2019-01-28 ENCOUNTER — Encounter: Payer: Self-pay | Admitting: Internal Medicine

## 2019-01-28 ENCOUNTER — Ambulatory Visit: Payer: Self-pay | Admitting: *Deleted

## 2019-01-28 DIAGNOSIS — Z20822 Contact with and (suspected) exposure to covid-19: Secondary | ICD-10-CM | POA: Insufficient documentation

## 2019-01-28 DIAGNOSIS — Z20828 Contact with and (suspected) exposure to other viral communicable diseases: Secondary | ICD-10-CM

## 2019-01-28 MED FILL — CALQUENCE 100 MG CAPSULE: 100 | 30 days supply | Qty: 60 | Fill #0

## 2019-01-28 NOTE — Assessment & Plan Note (Signed)
Brief but close exposure without coworker cough or sneezing, pt herself is essentially asymptomatic; I advised to follow at home for any s/s of infection, and to go to ED for worserning cough or sob or other unusual symptoms

## 2019-01-28 NOTE — Patient Instructions (Signed)
Please self isolate at home, with plan now for return to work on Monday apr 27  Please do not go to work if you develop symptoms of fever, cough or sob.  Please continue all other medications as before, and refills have been done if requested.  Please have the pharmacy call with any other refills you may need.  Please keep your appointments with your specialists as you may have planned

## 2019-01-28 NOTE — Telephone Encounter (Signed)
Pt called COVID line stating that she came in contact with someone who tested positive for the corona virus 01/23/2019; the pt would like to know if she should be concerned; she works with the patient, and it was during shift report that they had contact; the pt says that during the encounter they did not wear masks or gloves; recommendations made per nurse triage protocol; the pt normally sees Dr [name], [facility]; conference call initiated with Tammy; per Tammy pt offered virtual appointment with Dr Cathlean Cower at 1300; she verbalized understanding, and she can be contacted at [phone]; will route to office for notification.   Reason for Disposition . [1] Fever (or feeling feverish) OR cough AND [2] within 14 Days of COVID-19 EXPOSURE (Close Contact)  Answer Assessment - Initial Assessment Questions 1. CLOSE CONTACT: "Who is the person with the confirmed or suspected COVID-19 infection that you were exposed to?"     Co-worker 2. PLACE of CONTACT: "Where were you when you were exposed to COVID-19?" (e.g., home, school, medical waiting room; which city?)    Work, [place], [place] 3. TYPE of CONTACT: "How much contact was there?" (e.g., sitting next to, live in same house, work in same office, same building)     Sat next to person at work 4. DURATION of CONTACT: "How long were you in contact with the COVID-19 patient?" (e.g., a few seconds, passed by person, a few minutes, live with the patient)     minutes 5. DATE of CONTACT: "When did you have contact with a COVID-19 patient?" (e.g., how many days ago)     01/23/2019 6. TRAVEL: "Have you traveled out of the country recently?" If so, "When and where?"     * Also ask about out-of-state travel, since the CDC has identified some high risk cities for community spread in the [place].     * Note: Travel becomes less relevant if there is widespread community transmission where the patient lives.     no 7. COMMUNITY SPREAD: "Are there lots of cases or  COVID-19 (community spread) where you live?" (See public health department website, if unsure)   * MAJOR community spread: high number of cases; numbers of cases are increasing; many people hospitalized.   * MINOR community spread: low number of cases; not increasing; few or no people hospitalized     major 8. SYMPTOMS: "Do you have any symptoms?" (e.g., fever, cough, breathing difficulty)    Get's hot sometimes; diarrhea (pt says that her medication may cause this) 9. PREGNANCY OR POSTPARTUM: "Is there any chance you are pregnant?" "When was your last menstrual period?" "Did you deliver in the last 2 weeks?"    No menopause 10. HIGH RISK: "Do you have any heart or lung problems? Do you have a weak immune system?" (e.g., CHF, COPD, asthma, HIV positive, chemotherapy, renal failure, diabetes mellitus, sickle cell anemia)       Heart disease, leukemia (chemotherapy and immunotherapy)  Protocols used: CORONAVIRUS (COVID-19) EXPOSURE-A-AH

## 2019-01-28 NOTE — Telephone Encounter (Signed)
Noted  

## 2019-01-28 NOTE — Telephone Encounter (Signed)
Called her and given number of cardiology office. Ask her to call and schedule echo before chemo start next week.  She verbalized understanding. She is doing a virtual visit later today with PCP she has been exposed to a co-worker who has tested positive for COVID-19.  Echo scheduled for 4/27.

## 2019-01-28 NOTE — Progress Notes (Signed)
Patient ID: Audrey Gross, female   DOB: [birth year], 58 y.o.   MRN: [id number]  Virtual Visit via Video Note  I connected with Finis Bud on 01/28/19 at  1:00 PM EDT by a video enabled telemedicine application and verified that I am speaking with the correct person using two identifiers.  I am at office, pt is in her car, no other persons present   I discussed the limitations of evaluation and management by telemedicine and the availability of in person appointments. The patient expressed understanding and agreed to proceed.  History of Present Illness: Here to discuss after had a passing exposure to a coworker yesterday now proven COVID19 positive.  They work different shifts, but there is a short handoff discussion between workers of different shifts.  She denies fever, cough, sob, loss of taste or smell and Pt denies new neurological symptoms such as new headache, or facial or extremity weakness or numbness  Has had slight ST and loose stool this am but none since.  Pt denies chest pain, increased sob or doe, wheezing, orthopnea, PND, increased LE swelling, palpitations, dizziness or syncope.   Pt denies polydipsia, polyuria Past Medical History:  Diagnosis Date  . Atrial fibrillation ([place])   . CLL (chronic lymphoblastic leukemia)    Leukemia  . Cystic breast   . Depression   . Diffuse cystic mastopathy   . Dyslipidemia (high LDL; low HDL)   . Hypertension   . Iron deficiency anemia, unspecified   . Lymphadenopathy of head and neck 04/05/2015  . Medical non-compliance   . Mitral regurgitation 02/2017   moderate to severe  . Persistent atrial fibrillation with rapid ventricular response 07/15/2017   Past Surgical History:  Procedure Laterality Date  . CARDIOVERSION N/A 04/02/2017   Procedure: CARDIOVERSION;  Surgeon: Dorothy Spark, MD;  Location: [facility] ENDOSCOPY;  Service: Cardiovascular;  Laterality: N/A;  . CARDIOVERSION N/A 08/19/2017   Procedure: CARDIOVERSION;  Surgeon:  Larey Dresser, MD;  Location: [facility];  Service: Cardiovascular;  Laterality: N/A;  . IR IMAGING GUIDED PORT INSERTION  06/29/2018  . TUBAL LIGATION      reports that she has never smoked. She has never used smokeless tobacco. She reports that she does not drink alcohol or use drugs. family history includes Heart disease in her brother and maternal grandmother; Kidney disease in her father; Stomach cancer in her maternal aunt and paternal grandmother; Sudden death in her mother. No Known Allergies Current Outpatient Medications on File Prior to Visit  Medication Sig Dispense Refill  . Acalabrutinib 100 MG CAPS Take 100 mg by mouth 2 (two) times daily. 60 capsule 11  . allopurinol (ZYLOPRIM) 300 MG tablet Take 1 tablet (300 mg total) by mouth daily. 30 tablet 3  . Cholecalciferol (VITAMIN D-3) 1000 units CAPS Take 1,000 Units by mouth daily with breakfast.    . ELIQUIS 5 MG TABS tablet TAKE 1 TABLET BY MOUTH TWICE A DAY 180 tablet 1  . furosemide (LASIX) 20 MG tablet TAKE 1 TABLET BY MOUTH EVERY DAY AS NEEDED 90 tablet 2  . lidocaine-prilocaine (EMLA) cream Apply 1 application topically as needed. 30 g 6  . metoprolol succinate (TOPROL-XL) 100 MG 24 hr tablet Take 1 1/2 tablets (150 mg total) daily. Take with or immediately following a meal. 135 tablet 2  . potassium chloride SA (K-DUR,KLOR-CON) 20 MEQ tablet Take 1 tablet (20 mEq total) by mouth daily. 30 tablet 11  . sacubitril-valsartan (ENTRESTO) 24-26 MG Take 1 tablet  by mouth 2 (two) times daily. 180 tablet 3   No current facility-administered medications on file prior to visit.     Observations/Objective: Alert, NAD, not ill appearing but is very concerned affect and mood, mild nervous, resps normal, cn 2-12 intact, moves all 4s Lab Results  Component Value Date   WBC 2.6 (L) 01/21/2019   HGB 12.7 01/21/2019   HCT 39.9 01/21/2019   PLT 180 01/21/2019   GLUCOSE 111 (H) 01/06/2019   CHOL 170 07/16/2017   TRIG 100 07/16/2017    HDL 31 (L) 07/16/2017   LDLDIRECT 144.5 07/26/2013   LDLCALC 119 (H) 07/16/2017   ALT 19 01/06/2019   AST 21 01/06/2019   NA 143 01/06/2019   K 4.0 01/06/2019   CL 109 01/06/2019   CREATININE 0.76 01/06/2019   BUN 13 01/06/2019   CO2 25 01/06/2019   TSH 0.05 (L) 09/22/2017   INR 1.1 01/21/2019   Assessment and Plan: See notes  Follow Up Instructions: See notes   I discussed the assessment and treatment plan with the patient. The patient was provided an opportunity to ask questions and all were answered. The patient agreed with the plan and demonstrated an understanding of the instructions.   The patient was advised to call back or seek an in-person evaluation if the symptoms worsen or if the condition fails to improve as anticipated.   Cathlean Cower, MD

## 2019-01-31 ENCOUNTER — Other Ambulatory Visit: Payer: Self-pay | Admitting: Hematology and Oncology

## 2019-01-31 ENCOUNTER — Other Ambulatory Visit: Payer: Self-pay

## 2019-01-31 ENCOUNTER — Ambulatory Visit (HOSPITAL_COMMUNITY): Payer: BLUE CROSS/BLUE SHIELD | Attending: Cardiology

## 2019-01-31 DIAGNOSIS — C9110 Chronic lymphocytic leukemia of B-cell type not having achieved remission: Secondary | ICD-10-CM

## 2019-01-31 DIAGNOSIS — I480 Paroxysmal atrial fibrillation: Secondary | ICD-10-CM

## 2019-01-31 NOTE — Telephone Encounter (Signed)
Oral Oncology Patient Advocate Encounter  Confirmed with [facility] that [place] was shipped on 01/28/19 with a $0 copay using a copay card.   [facility] [place] Phone [phone] Fax [phone] 01/31/2019   9:04 AM

## 2019-01-31 NOTE — Progress Notes (Unsigned)
Patient arrived at office wearing a surgical mask, and upon reading telemedicine note from Dr. Lavone Orn we consulted with Lenard Forth (echo supervisor), Janan Halter (Nursing Supervisor), and DOD Dr. Acie Fredrickson concerning the patients contact with a confirmed COVID 19 patient.  It was decided to do a COVID 19 echo exam (limited exam) to decrease possible exposure time, however still provide patient with the exam as it is for chemo evaluation.  I wore a gown, gloves, face shield, and two surgical masks (homemade and more fitted mask under normal surgical mask).  I followed the [place] [employer] [place] protocol for PPE, and the room was cleaned with bleach wipes following the procedure.    Deliah [place], RDCS

## 2019-02-02 ENCOUNTER — Inpatient Hospital Stay: Payer: BLUE CROSS/BLUE SHIELD

## 2019-02-02 ENCOUNTER — Other Ambulatory Visit: Payer: Self-pay

## 2019-02-02 ENCOUNTER — Other Ambulatory Visit: Payer: Self-pay | Admitting: Hematology and Oncology

## 2019-02-02 DIAGNOSIS — C9110 Chronic lymphocytic leukemia of B-cell type not having achieved remission: Secondary | ICD-10-CM | POA: Diagnosis not present

## 2019-02-02 DIAGNOSIS — I10 Essential (primary) hypertension: Secondary | ICD-10-CM | POA: Diagnosis not present

## 2019-02-02 DIAGNOSIS — Z79899 Other long term (current) drug therapy: Secondary | ICD-10-CM | POA: Diagnosis not present

## 2019-02-02 DIAGNOSIS — R918 Other nonspecific abnormal finding of lung field: Secondary | ICD-10-CM | POA: Diagnosis not present

## 2019-02-02 DIAGNOSIS — Z5112 Encounter for antineoplastic immunotherapy: Secondary | ICD-10-CM | POA: Diagnosis not present

## 2019-02-02 DIAGNOSIS — Z9221 Personal history of antineoplastic chemotherapy: Secondary | ICD-10-CM | POA: Diagnosis not present

## 2019-02-02 DIAGNOSIS — D702 Other drug-induced agranulocytosis: Secondary | ICD-10-CM | POA: Diagnosis not present

## 2019-02-02 DIAGNOSIS — I482 Chronic atrial fibrillation, unspecified: Secondary | ICD-10-CM | POA: Diagnosis not present

## 2019-02-02 DIAGNOSIS — I429 Cardiomyopathy, unspecified: Secondary | ICD-10-CM | POA: Diagnosis not present

## 2019-02-02 LAB — CBC WITH DIFFERENTIAL/PLATELET
Abs Immature Granulocytes: 0 K/uL (ref 0.00–0.07)
Basophils Absolute: 0 K/uL (ref 0.0–0.1)
Basophils Relative: 1 %
Eosinophils Absolute: 0 K/uL (ref 0.0–0.5)
Eosinophils Relative: 1 %
HCT: 38.3 % (ref 36.0–46.0)
Hemoglobin: 12.1 g/dL (ref 12.0–15.0)
Immature Granulocytes: 0 %
Lymphocytes Relative: 69 %
Lymphs Abs: 4.4 K/uL — ABNORMAL HIGH (ref 0.7–4.0)
MCH: 28.5 pg (ref 26.0–34.0)
MCHC: 31.6 g/dL (ref 30.0–36.0)
MCV: 90.1 fL (ref 80.0–100.0)
Monocytes Absolute: 0.9 K/uL (ref 0.1–1.0)
Monocytes Relative: 14 %
Neutro Abs: 0.9 K/uL — ABNORMAL LOW (ref 1.7–7.7)
Neutrophils Relative %: 15 %
Platelets: 153 K/uL (ref 150–400)
RBC: 4.25 MIL/uL (ref 3.87–5.11)
RDW: 12.8 % (ref 11.5–15.5)
WBC: 6.3 K/uL (ref 4.0–10.5)
nRBC: 0 % (ref 0.0–0.2)

## 2019-02-02 LAB — COMPREHENSIVE METABOLIC PANEL
ALT: 13 U/L (ref 0–44)
AST: 17 U/L (ref 15–41)
Albumin: 4 g/dL (ref 3.5–5.0)
Alkaline Phosphatase: 75 U/L (ref 38–126)
Anion gap: 11 (ref 5–15)
BUN: 9 mg/dL (ref 6–20)
CO2: 24 mmol/L (ref 22–32)
Calcium: 9.1 mg/dL (ref 8.9–10.3)
Chloride: 109 mmol/L (ref 98–111)
Creatinine, Ser: 0.77 mg/dL (ref 0.44–1.00)
GFR calc Af Amer: >60 mL/min (ref >60)
GFR calc non Af Amer: >60 mL/min (ref >60)
Glucose, Bld: 107 mg/dL — ABNORMAL HIGH (ref 70–99)
Potassium: 3.8 mmol/L (ref 3.5–5.1)
Sodium: 144 mmol/L (ref 135–145)
Total Bilirubin: 0.5 mg/dL (ref 0.3–1.2)
Total Protein: 6.4 g/dL — ABNORMAL LOW (ref 6.5–8.1)

## 2019-02-02 LAB — LACTATE DEHYDROGENASE: LDH: 259 U/L — ABNORMAL HIGH (ref 98–192)

## 2019-02-03 ENCOUNTER — Ambulatory Visit: Payer: BLUE CROSS/BLUE SHIELD

## 2019-02-03 ENCOUNTER — Other Ambulatory Visit: Payer: Self-pay

## 2019-02-03 ENCOUNTER — Ambulatory Visit: Payer: Self-pay | Admitting: Hematology and Oncology

## 2019-02-03 ENCOUNTER — Inpatient Hospital Stay: Payer: BLUE CROSS/BLUE SHIELD

## 2019-02-03 VITALS — BP 136/84 | HR 66 | Temp 98.3°F | Resp 18

## 2019-02-03 DIAGNOSIS — D702 Other drug-induced agranulocytosis: Secondary | ICD-10-CM | POA: Diagnosis not present

## 2019-02-03 DIAGNOSIS — I482 Chronic atrial fibrillation, unspecified: Secondary | ICD-10-CM | POA: Diagnosis not present

## 2019-02-03 DIAGNOSIS — Z79899 Other long term (current) drug therapy: Secondary | ICD-10-CM | POA: Diagnosis not present

## 2019-02-03 DIAGNOSIS — Z9221 Personal history of antineoplastic chemotherapy: Secondary | ICD-10-CM | POA: Diagnosis not present

## 2019-02-03 DIAGNOSIS — C9110 Chronic lymphocytic leukemia of B-cell type not having achieved remission: Secondary | ICD-10-CM | POA: Diagnosis not present

## 2019-02-03 DIAGNOSIS — I10 Essential (primary) hypertension: Secondary | ICD-10-CM | POA: Diagnosis not present

## 2019-02-03 DIAGNOSIS — I429 Cardiomyopathy, unspecified: Secondary | ICD-10-CM | POA: Diagnosis not present

## 2019-02-03 DIAGNOSIS — R918 Other nonspecific abnormal finding of lung field: Secondary | ICD-10-CM | POA: Diagnosis not present

## 2019-02-03 DIAGNOSIS — Z5112 Encounter for antineoplastic immunotherapy: Secondary | ICD-10-CM | POA: Diagnosis not present

## 2019-02-03 MED ORDER — ACETAMINOPHEN 325 MG PO TABS
ORAL_TABLET | ORAL | Status: AC
  Filled 2019-02-03: qty 2

## 2019-02-03 MED ORDER — HEPARIN SOD (PORK) LOCK FLUSH 100 UNIT/ML IV SOLN
500.0000 [IU] | Freq: Once | INTRAVENOUS | Status: AC | PRN
  Administered 2019-02-03: 15:00:00 500 [IU]
  Filled 2019-02-03: qty 5

## 2019-02-03 MED ORDER — DIPHENHYDRAMINE HCL 50 MG/ML IJ SOLN
50.0000 mg | Freq: Once | INTRAMUSCULAR | Status: AC
  Administered 2019-02-03: 08:00:00 50 mg via INTRAVENOUS

## 2019-02-03 MED ORDER — SODIUM CHLORIDE 0.9 % IV SOLN
Freq: Once | INTRAVENOUS | Status: AC
  Administered 2019-02-03: 08:00:00 via INTRAVENOUS
  Filled 2019-02-03: qty 250

## 2019-02-03 MED ORDER — DIPHENHYDRAMINE HCL 50 MG/ML IJ SOLN
INTRAMUSCULAR | Status: AC
  Filled 2019-02-03: qty 1

## 2019-02-03 MED ORDER — SODIUM CHLORIDE 0.9% FLUSH
10.0000 mL | INTRAVENOUS | Status: DC | PRN
  Administered 2019-02-03: 10 mL
  Filled 2019-02-03: qty 10

## 2019-02-03 MED ORDER — SODIUM CHLORIDE 0.9 % IV SOLN
20.0000 mg | Freq: Once | INTRAVENOUS | Status: AC
  Administered 2019-02-03: 20 mg via INTRAVENOUS
  Filled 2019-02-03: qty 2

## 2019-02-03 MED ORDER — ACETAMINOPHEN 325 MG PO TABS
650.0000 mg | ORAL_TABLET | Freq: Once | ORAL | Status: AC
  Administered 2019-02-03: 08:00:00 650 mg via ORAL

## 2019-02-03 MED ORDER — SODIUM CHLORIDE 0.9 % IV SOLN
100.0000 mg | Freq: Once | INTRAVENOUS | Status: AC
  Administered 2019-02-03: 100 mg via INTRAVENOUS
  Filled 2019-02-03: qty 4

## 2019-02-03 NOTE — Patient Instructions (Signed)
[facility] Discharge Instructions for Patients Receiving Chemotherapy  Today you received the following chemotherapy agents Gazyva  To help prevent nausea and vomiting after your treatment, we encourage you to take your nausea medication as directed. If you develop nausea and vomiting that is not controlled by your nausea medication, call the clinic.   BELOW ARE SYMPTOMS THAT SHOULD BE REPORTED IMMEDIATELY:  *FEVER GREATER THAN 100.5 F  *CHILLS WITH OR WITHOUT FEVER  NAUSEA AND VOMITING THAT IS NOT CONTROLLED WITH YOUR NAUSEA MEDICATION  *UNUSUAL SHORTNESS OF BREATH  *UNUSUAL BRUISING OR BLEEDING  TENDERNESS IN MOUTH AND THROAT WITH OR WITHOUT PRESENCE OF ULCERS  *URINARY PROBLEMS  *BOWEL PROBLEMS  UNUSUAL RASH Items with * indicate a potential emergency and should be followed up as soon as possible.  Feel free to call the clinic should you have any questions or concerns. The clinic phone number is [phone].  Please show the [facility] at check-in to the Emergency Department and triage nurse.   Obinutuzumab injection What is this medicine? OBINUTUZUMAB (OH bi nue TOOZ ue mab) is a monoclonal antibody. It is used to treat chronic lymphocytic leukemia (CLL) and a type of non-Hodgkin lymphoma (NHL), follicular lymphoma. This medicine may be used for other purposes; ask your health care provider or pharmacist if you have questions. COMMON BRAND NAME(S): GAZYVA What should I tell my health care provider before I take this medicine? They need to know if you have any of these conditions: -infection (especially a virus infection such as hepatitis B virus) -lung or breathing disease -heart disease -take medicines that treat or prevent blood clots -an unusual or allergic reaction to obinutuzumab, other medicines, foods, dyes, or preservatives -pregnant or trying to get pregnant -breast-feeding How should I use this medicine? This medicine is for  infusion into a vein. It is given by a health care professional in a hospital or clinic setting. Talk to your pediatrician regarding the use of this medicine in children. Special care may be needed. Overdosage: If you think you have taken too much of this medicine contact a poison control center or emergency room at once. NOTE: This medicine is only for you. Do not share this medicine with others. What if I miss a dose? Keep appointments for follow-up doses as directed. It is important not to miss your dose. Call your doctor or health care professional if you are unable to keep an appointment. What may interact with this medicine? -live virus vaccines This list may not describe all possible interactions. Give your health care provider a list of all the medicines, herbs, non-prescription drugs, or dietary supplements you use. Also tell them if you smoke, drink alcohol, or use illegal drugs. Some items may interact with your medicine. What should I watch for while using this medicine? Report any side effects that you notice during your treatment right away, such as changes in your breathing, fever, chills, dizziness or lightheadedness. These effects are more common with the first dose. Visit your prescriber or health care professional for checks on your progress. You will need to have regular blood work. Report any other side effects. The side effects of this medicine can continue after you finish your treatment. Continue your course of treatment even though you feel ill unless your doctor tells you to stop. Call your doctor or health care professional for advice if you get a fever, chills or sore throat, or other symptoms of a cold or flu. Do not treat  yourself. This drug decreases your body's ability to fight infections. Try to avoid being around people who are sick. This medicine may increase your risk to bruise or bleed. Call your doctor or health care professional if you notice any unusual  bleeding. What side effects may I notice from receiving this medicine? Side effects that you should report to your doctor or health care professional as soon as possible: -allergic reactions like skin rash, itching or hives, swelling of the face, lips, or tongue -breathing problems -changes in vision -chest pain or chest tightness -confusion -dizziness -loss of balance or coordination -low blood counts - this medicine may decrease the number of white blood cells, red blood cells and platelets. You may be at increased risk for infections and bleeding. -signs of decreased platelets or bleeding - bruising, pinpoint red spots on the skin, black, tarry stools, blood in the urine -signs of infection - fever or chills, cough, sore throat, pain or trouble passing urine -signs and symptoms of liver injury like dark yellow or brown urine; general ill feeling or flu-like symptoms; light-colored stools; loss of appetite; nausea; right upper belly pain; unusually weak or tired; yellowing of the eyes or skin -trouble speaking or understanding -trouble walking -vomiting Side effects that usually do not require medical attention (report to your doctor or health care professional if they continue or are bothersome): -constipation -joint pain -muscle pain This list may not describe all possible side effects. Call your doctor for medical advice about side effects. You may report side effects to FDA at 1-800-FDA-1088. Where should I keep my medicine? This drug is only given in a hospital or clinic and will not be stored at home. NOTE: This sheet is a summary. It may not cover all possible information. If you have questions about this medicine, talk to your doctor, pharmacist, or health care provider.  2019 Elsevier/Gold Standard (2015-10-25 08:54:03)

## 2019-02-04 ENCOUNTER — Inpatient Hospital Stay: Payer: BLUE CROSS/BLUE SHIELD | Attending: Hematology and Oncology

## 2019-02-04 ENCOUNTER — Other Ambulatory Visit: Payer: Self-pay

## 2019-02-04 ENCOUNTER — Telehealth: Payer: Self-pay

## 2019-02-04 VITALS — BP 145/73 | HR 63 | Temp 98.1°F | Resp 16

## 2019-02-04 DIAGNOSIS — Z79899 Other long term (current) drug therapy: Secondary | ICD-10-CM | POA: Insufficient documentation

## 2019-02-04 DIAGNOSIS — Z7901 Long term (current) use of anticoagulants: Secondary | ICD-10-CM | POA: Diagnosis not present

## 2019-02-04 DIAGNOSIS — D61818 Other pancytopenia: Secondary | ICD-10-CM | POA: Diagnosis not present

## 2019-02-04 DIAGNOSIS — C9110 Chronic lymphocytic leukemia of B-cell type not having achieved remission: Secondary | ICD-10-CM | POA: Diagnosis not present

## 2019-02-04 DIAGNOSIS — I428 Other cardiomyopathies: Secondary | ICD-10-CM | POA: Diagnosis not present

## 2019-02-04 DIAGNOSIS — Z5111 Encounter for antineoplastic chemotherapy: Secondary | ICD-10-CM | POA: Insufficient documentation

## 2019-02-04 MED ORDER — ACETAMINOPHEN 325 MG PO TABS
ORAL_TABLET | ORAL | Status: AC
  Filled 2019-02-04: qty 2

## 2019-02-04 MED ORDER — SODIUM CHLORIDE 0.9 % IV SOLN
900.0000 mg | Freq: Once | INTRAVENOUS | Status: AC
  Administered 2019-02-04: 900 mg via INTRAVENOUS
  Filled 2019-02-04: qty 36

## 2019-02-04 MED ORDER — SODIUM CHLORIDE 0.9 % IV SOLN
20.0000 mg | Freq: Once | INTRAVENOUS | Status: AC
  Administered 2019-02-04: 20 mg via INTRAVENOUS
  Filled 2019-02-04: qty 2

## 2019-02-04 MED ORDER — SODIUM CHLORIDE 0.9 % IV SOLN
Freq: Once | INTRAVENOUS | Status: AC
  Administered 2019-02-04: 10:00:00 via INTRAVENOUS
  Filled 2019-02-04: qty 250

## 2019-02-04 MED ORDER — DIPHENHYDRAMINE HCL 50 MG/ML IJ SOLN
INTRAMUSCULAR | Status: AC
  Filled 2019-02-04: qty 1

## 2019-02-04 MED ORDER — DIPHENHYDRAMINE HCL 50 MG/ML IJ SOLN
50.0000 mg | Freq: Once | INTRAMUSCULAR | Status: AC
  Administered 2019-02-04: 50 mg via INTRAVENOUS

## 2019-02-04 MED ORDER — HEPARIN SOD (PORK) LOCK FLUSH 100 UNIT/ML IV SOLN
500.0000 [IU] | Freq: Once | INTRAVENOUS | Status: AC | PRN
  Administered 2019-02-04: 500 [IU]
  Filled 2019-02-04: qty 5

## 2019-02-04 MED ORDER — ACETAMINOPHEN 325 MG PO TABS
650.0000 mg | ORAL_TABLET | Freq: Once | ORAL | Status: AC
  Administered 2019-02-04: 650 mg via ORAL

## 2019-02-04 MED ORDER — SODIUM CHLORIDE 0.9% FLUSH
10.0000 mL | INTRAVENOUS | Status: DC | PRN
  Administered 2019-02-04: 10 mL
  Filled 2019-02-04: qty 10

## 2019-02-04 NOTE — Telephone Encounter (Signed)
Called regarding below message to follow up after treatment and spoke with her in the infusion room. She is getting second day of treatment. She is doing okay. Having a little back and the heater does not work on the chair. Instructed to call the office Monday if she needs anything. She verbalized understanding.

## 2019-02-04 NOTE — Telephone Encounter (Signed)
-----   Message from Priscille Loveless, RN sent at 02/03/2019  8:25 AM EDT ----- Regarding: First chemo Follow up Alvy Bimler First chemo obinutuzumab

## 2019-02-04 NOTE — Progress Notes (Signed)
Per Dr Alvy Bimler ok to tx today with ANC 0.9  (w/o repeat labs)

## 2019-02-04 NOTE — Patient Instructions (Signed)
[facility] Discharge Instructions for Patients Receiving Chemotherapy  Today you received the following chemotherapy agents Gazyva  To help prevent nausea and vomiting after your treatment, we encourage you to take your nausea medication as directed. If you develop nausea and vomiting that is not controlled by your nausea medication, call the clinic.   BELOW ARE SYMPTOMS THAT SHOULD BE REPORTED IMMEDIATELY:  *FEVER GREATER THAN 100.5 F  *CHILLS WITH OR WITHOUT FEVER  NAUSEA AND VOMITING THAT IS NOT CONTROLLED WITH YOUR NAUSEA MEDICATION  *UNUSUAL SHORTNESS OF BREATH  *UNUSUAL BRUISING OR BLEEDING  TENDERNESS IN MOUTH AND THROAT WITH OR WITHOUT PRESENCE OF ULCERS  *URINARY PROBLEMS  *BOWEL PROBLEMS  UNUSUAL RASH Items with * indicate a potential emergency and should be followed up as soon as possible.  Feel free to call the clinic should you have any questions or concerns. The clinic phone number is [phone].  Please show the [facility] at check-in to the Emergency Department and triage nurse.   Coronavirus (COVID-19) Are you at risk?  Are you at risk for the Coronavirus (COVID-19)?  To be considered HIGH RISK for Coronavirus (COVID-19), you have to meet the following criteria:  . Traveled to [place], [place], [place], [place] or [place]; or in the [place] to [place], [place], [place], or [place]; and have fever, cough, and shortness of breath within the last 2 weeks of travel OR . Been in close contact with a person diagnosed with COVID-19 within the last 2 weeks and have fever, cough, and shortness of breath . IF YOU DO NOT MEET THESE CRITERIA, YOU ARE CONSIDERED LOW RISK FOR COVID-19.  What to do if you are HIGH RISK for COVID-19?  Marland Kitchen If you are having a medical emergency, call 911. . Seek medical care right away. Before you go to a doctor's office, urgent care or emergency department, call ahead and tell them about your recent  travel, contact with someone diagnosed with COVID-19, and your symptoms. You should receive instructions from your physician's office regarding next steps of care.  . When you arrive at healthcare provider, tell the healthcare staff immediately you have returned from visiting [place], [place], [place], [place] or [place]; or traveled in the [place] to [place], [place], [place], or [place]; in the last two weeks or you have been in close contact with a person diagnosed with COVID-19 in the last 2 weeks.   . Tell the health care staff about your symptoms: fever, cough and shortness of breath. . After you have been seen by a medical provider, you will be either: o Tested for (COVID-19) and discharged home on quarantine except to seek medical care if symptoms worsen, and asked to  - Stay home and avoid contact with others until you get your results (4-5 days)  - Avoid travel on public transportation if possible (such as bus, train, or airplane) or o Sent to the Emergency Department by EMS for evaluation, COVID-19 testing, and possible admission depending on your condition and test results.  What to do if you are LOW RISK for COVID-19?  Reduce your risk of any infection by using the same precautions used for avoiding the common cold or flu:  Marland Kitchen Wash your hands often with soap and warm water for at least 20 seconds.  If soap and water are not readily available, use an alcohol-based hand sanitizer with at least 60% alcohol.  . If coughing or sneezing, cover  your mouth and nose by coughing or sneezing into the elbow areas of your shirt or coat, into a tissue or into your sleeve (not your hands). . Avoid shaking hands with others and consider head nods or verbal greetings only. . Avoid touching your eyes, nose, or mouth with unwashed hands.  . Avoid close contact with people who are sick. . Avoid places or events with large numbers of people in one location, like concerts or sporting  events. . Carefully consider travel plans you have or are making. . If you are planning any travel outside or inside the [place], visit the CDC's Travelers' Health webpage for the latest health notices. . If you have some symptoms but not all symptoms, continue to monitor at home and seek medical attention if your symptoms worsen. . If you are having a medical emergency, call 911.   [place] / e-Visit: [email or web address]         [facility]: [facility]: [phone]                   [facility]: [phone]

## 2019-02-10 ENCOUNTER — Inpatient Hospital Stay: Payer: BLUE CROSS/BLUE SHIELD

## 2019-02-10 ENCOUNTER — Other Ambulatory Visit: Payer: Self-pay

## 2019-02-10 DIAGNOSIS — Z79899 Other long term (current) drug therapy: Secondary | ICD-10-CM | POA: Diagnosis not present

## 2019-02-10 DIAGNOSIS — Z5111 Encounter for antineoplastic chemotherapy: Secondary | ICD-10-CM | POA: Diagnosis not present

## 2019-02-10 DIAGNOSIS — C9110 Chronic lymphocytic leukemia of B-cell type not having achieved remission: Secondary | ICD-10-CM

## 2019-02-10 DIAGNOSIS — Z7901 Long term (current) use of anticoagulants: Secondary | ICD-10-CM | POA: Diagnosis not present

## 2019-02-10 DIAGNOSIS — I428 Other cardiomyopathies: Secondary | ICD-10-CM | POA: Diagnosis not present

## 2019-02-10 DIAGNOSIS — D61818 Other pancytopenia: Secondary | ICD-10-CM | POA: Diagnosis not present

## 2019-02-10 LAB — CBC WITH DIFFERENTIAL/PLATELET
Abs Immature Granulocytes: 0.02 10*3/uL (ref 0.00–0.07)
Basophils Absolute: 0 10*3/uL (ref 0.0–0.1)
Basophils Relative: 0 %
Eosinophils Absolute: 0 10*3/uL (ref 0.0–0.5)
Eosinophils Relative: 0 %
HCT: 39.4 % (ref 36.0–46.0)
Hemoglobin: 12.3 g/dL (ref 12.0–15.0)
Immature Granulocytes: 0 %
Lymphocytes Relative: 45 %
Lymphs Abs: 2.8 10*3/uL (ref 0.7–4.0)
MCH: 28.3 pg (ref 26.0–34.0)
MCHC: 31.2 g/dL (ref 30.0–36.0)
MCV: 90.6 fL (ref 80.0–100.0)
Monocytes Absolute: 0.7 10*3/uL (ref 0.1–1.0)
Monocytes Relative: 11 %
Neutro Abs: 2.8 10*3/uL (ref 1.7–7.7)
Neutrophils Relative %: 44 %
Platelets: 152 10*3/uL (ref 150–400)
RBC: 4.35 MIL/uL (ref 3.87–5.11)
RDW: 13.1 % (ref 11.5–15.5)
WBC: 6.3 10*3/uL (ref 4.0–10.5)
nRBC: 0 % (ref 0.0–0.2)

## 2019-02-10 LAB — COMPREHENSIVE METABOLIC PANEL
ALT: 13 U/L (ref 0–44)
AST: 12 U/L — ABNORMAL LOW (ref 15–41)
Albumin: 4.1 g/dL (ref 3.5–5.0)
Alkaline Phosphatase: 77 U/L (ref 38–126)
Anion gap: 10 (ref 5–15)
BUN: 11 mg/dL (ref 6–20)
CO2: 25 mmol/L (ref 22–32)
Calcium: 9.1 mg/dL (ref 8.9–10.3)
Chloride: 110 mmol/L (ref 98–111)
Creatinine, Ser: 0.84 mg/dL (ref 0.44–1.00)
GFR calc Af Amer: >60 mL/min (ref >60)
GFR calc non Af Amer: >60 mL/min (ref >60)
Glucose, Bld: 101 mg/dL — ABNORMAL HIGH (ref 70–99)
Potassium: 3.7 mmol/L (ref 3.5–5.1)
Sodium: 145 mmol/L (ref 135–145)
Total Bilirubin: 0.7 mg/dL (ref 0.3–1.2)
Total Protein: 6.9 g/dL (ref 6.5–8.1)

## 2019-02-11 ENCOUNTER — Telehealth: Payer: Self-pay | Admitting: *Deleted

## 2019-02-11 ENCOUNTER — Other Ambulatory Visit: Payer: Self-pay | Admitting: Hematology and Oncology

## 2019-02-11 DIAGNOSIS — C9110 Chronic lymphocytic leukemia of B-cell type not having achieved remission: Secondary | ICD-10-CM

## 2019-02-11 NOTE — Telephone Encounter (Signed)
-----   Message from Heath Lark, MD sent at 02/11/2019  8:26 AM EDT ----- Regarding: labs yesterday Pls call and let her know her test results from yesterday is completely normal! [place] news

## 2019-02-11 NOTE — Telephone Encounter (Signed)
Left message with information below as directed and a request to return call with any further questions.

## 2019-02-16 ENCOUNTER — Inpatient Hospital Stay (HOSPITAL_BASED_OUTPATIENT_CLINIC_OR_DEPARTMENT_OTHER): Payer: BLUE CROSS/BLUE SHIELD | Admitting: Hematology and Oncology

## 2019-02-16 ENCOUNTER — Other Ambulatory Visit: Payer: Self-pay

## 2019-02-16 ENCOUNTER — Inpatient Hospital Stay: Payer: BLUE CROSS/BLUE SHIELD

## 2019-02-16 VITALS — BP 134/75 | HR 76 | Temp 98.6°F | Resp 16

## 2019-02-16 DIAGNOSIS — I428 Other cardiomyopathies: Secondary | ICD-10-CM

## 2019-02-16 DIAGNOSIS — Z79899 Other long term (current) drug therapy: Secondary | ICD-10-CM | POA: Diagnosis not present

## 2019-02-16 DIAGNOSIS — C9110 Chronic lymphocytic leukemia of B-cell type not having achieved remission: Secondary | ICD-10-CM | POA: Diagnosis not present

## 2019-02-16 DIAGNOSIS — Z95828 Presence of other vascular implants and grafts: Secondary | ICD-10-CM

## 2019-02-16 DIAGNOSIS — Z7901 Long term (current) use of anticoagulants: Secondary | ICD-10-CM | POA: Diagnosis not present

## 2019-02-16 DIAGNOSIS — D61818 Other pancytopenia: Secondary | ICD-10-CM | POA: Diagnosis not present

## 2019-02-16 DIAGNOSIS — Z452 Encounter for adjustment and management of vascular access device: Secondary | ICD-10-CM

## 2019-02-16 DIAGNOSIS — Z5111 Encounter for antineoplastic chemotherapy: Secondary | ICD-10-CM | POA: Diagnosis not present

## 2019-02-16 LAB — COMPREHENSIVE METABOLIC PANEL
ALT: 12 U/L (ref 0–44)
AST: 14 U/L — ABNORMAL LOW (ref 15–41)
Albumin: 3.8 g/dL (ref 3.5–5.0)
Alkaline Phosphatase: 74 U/L (ref 38–126)
Anion gap: 9 (ref 5–15)
BUN: 9 mg/dL (ref 6–20)
CO2: 24 mmol/L (ref 22–32)
Calcium: 8.9 mg/dL (ref 8.9–10.3)
Chloride: 111 mmol/L (ref 98–111)
Creatinine, Ser: 0.7 mg/dL (ref 0.44–1.00)
GFR calc Af Amer: >60 mL/min (ref >60)
GFR calc non Af Amer: >60 mL/min (ref >60)
Glucose, Bld: 98 mg/dL (ref 70–99)
Potassium: 3.2 mmol/L — ABNORMAL LOW (ref 3.5–5.1)
Sodium: 144 mmol/L (ref 135–145)
Total Bilirubin: 1.1 mg/dL (ref 0.3–1.2)
Total Protein: 6.2 g/dL — ABNORMAL LOW (ref 6.5–8.1)

## 2019-02-16 LAB — CBC WITH DIFFERENTIAL/PLATELET
Abs Immature Granulocytes: 0 10*3/uL (ref 0.00–0.07)
Basophils Absolute: 0 10*3/uL (ref 0.0–0.1)
Basophils Relative: 1 %
Eosinophils Absolute: 0 10*3/uL (ref 0.0–0.5)
Eosinophils Relative: 1 %
HCT: 35.4 % — ABNORMAL LOW (ref 36.0–46.0)
Hemoglobin: 11.3 g/dL — ABNORMAL LOW (ref 12.0–15.0)
Immature Granulocytes: 0 %
Lymphocytes Relative: 45 %
Lymphs Abs: 1.9 10*3/uL (ref 0.7–4.0)
MCH: 28.5 pg (ref 26.0–34.0)
MCHC: 31.9 g/dL (ref 30.0–36.0)
MCV: 89.4 fL (ref 80.0–100.0)
Monocytes Absolute: 0.4 10*3/uL (ref 0.1–1.0)
Monocytes Relative: 10 %
Neutro Abs: 1.7 10*3/uL (ref 1.7–7.7)
Neutrophils Relative %: 43 %
Platelets: 149 10*3/uL — ABNORMAL LOW (ref 150–400)
RBC: 3.96 MIL/uL (ref 3.87–5.11)
RDW: 13.3 % (ref 11.5–15.5)
WBC: 4.1 10*3/uL (ref 4.0–10.5)
nRBC: 0 % (ref 0.0–0.2)

## 2019-02-16 LAB — URIC ACID: Uric Acid, Serum: 3.9 mg/dL (ref 2.5–7.1)

## 2019-02-16 LAB — LACTATE DEHYDROGENASE: LDH: 168 U/L (ref 98–192)

## 2019-02-16 MED ORDER — DIPHENHYDRAMINE HCL 50 MG/ML IJ SOLN
50.0000 mg | Freq: Once | INTRAMUSCULAR | Status: AC
  Administered 2019-02-16: 50 mg via INTRAVENOUS

## 2019-02-16 MED ORDER — ACETAMINOPHEN 325 MG PO TABS
ORAL_TABLET | ORAL | Status: AC
  Filled 2019-02-16: qty 2

## 2019-02-16 MED ORDER — HEPARIN SOD (PORK) LOCK FLUSH 100 UNIT/ML IV SOLN
500.0000 [IU] | Freq: Once | INTRAVENOUS | Status: AC | PRN
  Administered 2019-02-16: 500 [IU]
  Filled 2019-02-16: qty 5

## 2019-02-16 MED ORDER — SODIUM CHLORIDE 0.9% FLUSH
10.0000 mL | Freq: Once | INTRAVENOUS | Status: AC
  Administered 2019-02-16: 11:00:00 10 mL
  Filled 2019-02-16: qty 10

## 2019-02-16 MED ORDER — SODIUM CHLORIDE 0.9 % IV SOLN
Freq: Once | INTRAVENOUS | Status: AC
  Administered 2019-02-16: 11:00:00 via INTRAVENOUS
  Filled 2019-02-16: qty 250

## 2019-02-16 MED ORDER — SODIUM CHLORIDE 0.9% FLUSH
10.0000 mL | INTRAVENOUS | Status: DC | PRN
  Administered 2019-02-16: 10 mL
  Filled 2019-02-16: qty 10

## 2019-02-16 MED ORDER — SODIUM CHLORIDE 0.9 % IV SOLN
1000.0000 mg | Freq: Once | INTRAVENOUS | Status: AC
  Administered 2019-02-16: 1000 mg via INTRAVENOUS
  Filled 2019-02-16: qty 40

## 2019-02-16 MED ORDER — ACETAMINOPHEN 325 MG PO TABS
650.0000 mg | ORAL_TABLET | Freq: Once | ORAL | Status: AC
  Administered 2019-02-16: 650 mg via ORAL

## 2019-02-16 MED ORDER — SODIUM CHLORIDE 0.9 % IV SOLN
20.0000 mg | Freq: Once | INTRAVENOUS | Status: AC
  Administered 2019-02-16: 20 mg via INTRAVENOUS
  Filled 2019-02-16: qty 2

## 2019-02-16 MED ORDER — DIPHENHYDRAMINE HCL 50 MG/ML IJ SOLN
INTRAMUSCULAR | Status: AC
  Filled 2019-02-16: qty 1

## 2019-02-16 NOTE — Patient Instructions (Signed)
[facility] Discharge Instructions for Patients Receiving Chemotherapy  Today you received the following chemotherapy agents:  Gazyva  To help prevent nausea and vomiting after your treatment, we encourage you to take your nausea medication as prescribed.   If you develop nausea and vomiting that is not controlled by your nausea medication, call the clinic.   BELOW ARE SYMPTOMS THAT SHOULD BE REPORTED IMMEDIATELY:  *FEVER GREATER THAN 100.5 F  *CHILLS WITH OR WITHOUT FEVER  NAUSEA AND VOMITING THAT IS NOT CONTROLLED WITH YOUR NAUSEA MEDICATION  *UNUSUAL SHORTNESS OF BREATH  *UNUSUAL BRUISING OR BLEEDING  TENDERNESS IN MOUTH AND THROAT WITH OR WITHOUT PRESENCE OF ULCERS  *URINARY PROBLEMS  *BOWEL PROBLEMS  UNUSUAL RASH Items with * indicate a potential emergency and should be followed up as soon as possible.  Feel free to call the clinic should you have any questions or concerns. The clinic phone number is [phone].  Please show the [facility] at check-in to the Emergency Department and triage nurse.   COVID-19 Labs  No results for input(s): DDIMER, FERRITIN, LDH, CRP in the last 72 hours.  No results found for: [id number]

## 2019-02-16 NOTE — Patient Instructions (Signed)

## 2019-02-17 ENCOUNTER — Encounter: Payer: Self-pay | Admitting: Hematology and Oncology

## 2019-02-17 NOTE — Assessment & Plan Note (Signed)
She appears to have positive response to treatment with near complete regression of the lymph node in her right axilla She tolerated treatment very well with no side effects Her neutropenia has resolved She will continue treatment as schedule I recommend minimum 3 months of therapy before repeat imaging study

## 2019-02-17 NOTE — Assessment & Plan Note (Signed)
Her recent echocardiogram showed no evidence of changes in ejection fraction I will continue to observe closely

## 2019-02-17 NOTE — Assessment & Plan Note (Signed)
She has mild intermittent pancytopenia due to treatment but not symptomatic Observe only for now.

## 2019-02-17 NOTE — Progress Notes (Signed)
[facility] OFFICE PROGRESS NOTE  Patient Care Team: Janith Lima, MD as PCP - General (Internal Medicine) Constance Haw, MD as PCP - Electrophysiology (Cardiology) Constance Haw, MD as Consulting Physician (Cardiology)  ASSESSMENT & PLAN:  CLL (chronic lymphocytic leukemia) ([facility]) She appears to have positive response to treatment with near complete regression of the lymph node in her right axilla She tolerated treatment very well with no side effects Her neutropenia has resolved She will continue treatment as schedule I recommend minimum 3 months of therapy before repeat imaging study  Nonischemic cardiomyopathy ([place]) Her recent echocardiogram showed no evidence of changes in ejection fraction I will continue to observe closely  Pancytopenia, acquired [facility]) She has mild intermittent pancytopenia due to treatment but not symptomatic Observe only for now.   No orders of the defined types were placed in this encounter.   INTERVAL HISTORY: Please see below for problem oriented charting. She returns for further follow-up She tolerated treatment very well with no side effects Denies abnormal heart rhythm No recent infection, fever or chills No infusion reaction to treatment The lymph nodes are barely palpable in her axilla  SUMMARY OF ONCOLOGIC HISTORY:   CLL (chronic lymphocytic leukemia) ([place])   04/05/2015 Pathology Results    Accession: [id number] flow cytometry confirmed CLL. FISH was positive for p53 mutation    04/24/2015 Imaging    Extensive lymphadenopathy throughout the neck, chest (axilla), abdomen and pelvis, as detailed above, compatible with the reported clinical history of lymphoma. 2. Mild splenomegaly.    05/03/2015 - 08/27/2015 Chemotherapy    She started on Ibrutinib, discontinued prematurely when her prescription ran out    10/10/2015 - 05/29/2017 Chemotherapy    She was restarted back on Ibrutinib    11/13/2016 PET scan   Significant generalized reduction in size of numerous lymph nodes in the neck, chest, abdomen, and pelvis. Previously the activity of these nodes was low-level and in general a similar low-level activity is present today, significantly less than mediastinal blood pool activity, compatible with Deauville 2. 2. Coronary atherosclerosis. Mild cardiomegaly. 3. Mildly prominent endometrium for age without accentuated metabolic activity in the endometrium. Consider pelvic sonography for further characterization.    05/27/2017 PET scan    1. Progressive hypermetabolic adenopathy, primarily involving cervical, axillary, pelvic and inguinal lymph nodes bilaterally, consistent with progressive lymphoma. 2. No solid visceral organ or osseous involvement.    06/16/2017 - 08/25/2017 Chemotherapy    The patient had 3 cycles of Rituximab and Bendamustine    07/15/2017 - 07/19/2017 Hospital Admission    The patient was briefly admitted to the hospital due to infusion reaction to rituximab    09/18/2017 PET scan    1. Continued considerable adenopathy in the neck, chest, abdomen, and pelvis. This is generally stable in size but mildly reduced in activity compared to the prior exam. Current levels of activity primarily Deauville 3 and Deauville 4. No splenomegaly. 2. Diffuse new ground-glass opacities in the lungs with associated hypermetabolic activity. Some forms of lymphoma infiltration can rarely cause this pattern of diffuse ground-glass opacity and hypermetabolic activity. Differential diagnostic considerations might include atypical pneumonia such as mycoplasma, acute hypersensitivity pneumonitis, or acute eosinophilic pneumonia. Pulmonary hemorrhage seems less likely to cause this degree of accentuated metabolic activity.  3. Aortic Atherosclerosis (ICD10-I70.0). Coronary atherosclerosis.    09/24/2017 -  Chemotherapy    The patient had ventoclax for chemotherapy treatment.  Rituximab is added on 11/18/17  to 04/09/18, x 6 cycles  11/19/2017 PET scan    Overall mild interval decrease in hypermetabolic lymphadenopathy throughout the neck, chest, abdomen, and pelvis. No new or increased lymphadenopathy identified.  While there has been resolution of diffuse hypermetabolic bilateral ground-glass pulmonary opacity since prior study, there is a new 14 mm hypermetabolic pulmonary nodule in the posterior right lower lobe. Time course favors inflammatory or infectious etiology over neoplasm. Recommend continued follow-up by chest CT in 3 months.    02/16/2018 PET scan    1. Adenopathy in the neck, chest, and pelvis is stable to minimally reduced in size, and is moderately reduced in activity, primarily Deauville 2 disease today. There is a right common iliac lymph node qualifying as Deauville 3 disease which is stable in size but reduced in activity. 2. Stable size but reduced activity in a pulmonary nodule in the right lower lobe. If this represents a leukemic lesion then a corresponds to Deauville 4 disease. This lesion was not readily apparent on 09/22/2017 but was reported on the prior chest CT of 11/19/2017. 3. Other imaging findings of potential clinical significance: Mild thyroid goiter. Aortic Atherosclerosis (ICD10-I70.0). Coronary atherosclerosis. Mild cardiomegaly.    04/19/2018 PET scan    1. Interval mild mixed metabolic changes, as detailed. Persistent mildly hypermetabolic bilateral axillary, mediastinal and bilateral pelvic adenopathy and mildly hypermetabolic right lower lobe pulmonary nodule compatible with lymphoproliferative disorder. Deauville 4 based on the subcarinal node. 2. Aortic Atherosclerosis (ICD10-I70.0).    06/29/2018 Procedure    Successful placement of a right internal jugular approach power injectable Port-A-Cath. The catheter is ready for immediate use.    07/26/2018 PET scan    1. Adenopathy primarily in the chest and pelvis as noted above, with size of the mildly enlarged  lymph nodes stable to minimally increased, but with nodal activity generally decreased compared to the prior exam. Uptake in these nodes is primarily Deauville 2. 2. Aortic Atherosclerosis (ICD10-I70.0). Coronary atherosclerosis with mild cardiomegaly.    10/29/2018 PET scan    1. Relatively stable sized lymphadenopathy but interval increase in hypermetabolism when compared to the most recent prior PET-CT, as detailed above. 2. No new disease is identified. 3. Stable bilateral pulmonary nodules.    11/12/2018 - 02/02/2019 Chemotherapy    The patient had riTUXimab (RITUXAN) 800 mg in sodium chloride 0.9 % 170 mL infusion, 375 mg/m2 = 800 mg, Intravenous,  Once, 3 of 4 cycles Administration: 800 mg (11/12/2018), 800 mg (12/10/2018), 800 mg (01/06/2019)  for chemotherapy treatment.     01/11/2019 Imaging    1. Significant increased adenopathy in the chest, abdomen, and pelvis compared to the 10/28/2018 PET-CT. 2. The scattered tiny pulmonary nodules and larger single right lower lobe pulmonary nodule are all stable. 3. Other imaging findings of potential clinical significance: Aortic Atherosclerosis (ICD10-I70.0). Coronary atherosclerosis. Mild cardiomegaly. Mild diffuse thyroid prominence, stable. Mild left foraminal impingement at L4-5.    01/21/2019 Procedure    Successful ultrasound-guided core biopsies of an enlarged right axillary lymph node.     01/21/2019 Pathology Results    Lymph node, needle/core biopsy, Right Axilla - NON-HODGKIN B-CELL LYMPHOMA CONSISTENT WITH CHRONIC LYMPHOCYTIC LEUKEMIA/SMALL LYMPHOCYTIC LYMPHOMA - SEE COMMENT Microscopic Comment The biopsies are small core biopsies composed of a monotonous population of lymphocytes which are positive for CD20 without expression of cyclin-D1. The latter is important because it essentially rules out the possibility of mantle cell lymphoma. CD3 highlights a small population of T-cells. By flow cytometry, a kappa-restricted monoclonal B-cell  population that expresses CD19, CD20,  CD5, and CD23 comprises 89% of all lymphocytes. Overall, the features are consistent with chronic lymphocytic leukemia/small lymphocytic lymphoma.    01/31/2019 Echocardiogram     1. The left ventricle has normal systolic function, with an ejection fraction of 55-60%. The cavity size was normal. Left ventricular diastolic Doppler parameters are consistent with impaired relaxation. No evidence of left ventricular regional wall motion abnormalities. GLS -20%.  2. The right ventricle has normal systolic function. The cavity was normal. There is no increase in right ventricular wall thickness.  3. The aortic valve is tricuspid. Mild calcification of the aortic valve. No stenosis of the aortic valve.  4. The aortic root is normal in size and structure.  5. 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  6. No evidence of mitral valve stenosis. No significant mitral regurgitation.  7. Normal IVC size. No complete TR doppler jet so unable to estimate PA systolic pressure.    02/03/2019 -  Chemotherapy    The patient had obinutuzumab and Acalabrutinib for chemotherapy treatment.      REVIEW OF SYSTEMS:   Constitutional: Denies fevers, chills or abnormal weight loss Eyes: Denies blurriness of vision Ears, nose, mouth, throat, and face: Denies mucositis or sore throat Respiratory: Denies cough, dyspnea or wheezes Cardiovascular: Denies palpitation, chest discomfort or lower extremity swelling Gastrointestinal:  Denies nausea, heartburn or change in bowel habits Skin: Denies abnormal skin rashes Lymphatics: Denies new lymphadenopathy or easy bruising Neurological:Denies numbness, tingling or new weaknesses Behavioral/Psych: Mood is stable, no new changes  All other systems were reviewed with the patient and are negative.  I have reviewed the past medical history, past surgical history, social history and family history with the patient and they are  unchanged from previous note.  ALLERGIES:  has No Known Allergies.  MEDICATIONS:  Current Outpatient Medications  Medication Sig Dispense Refill  . Acalabrutinib 100 MG CAPS Take 100 mg by mouth 2 (two) times daily. 60 capsule 11  . allopurinol (ZYLOPRIM) 300 MG tablet Take 1 tablet (300 mg total) by mouth daily. 30 tablet 3  . Cholecalciferol (VITAMIN D-3) 1000 units CAPS Take 1,000 Units by mouth daily with breakfast.    . ELIQUIS 5 MG TABS tablet TAKE 1 TABLET BY MOUTH TWICE A DAY 180 tablet 1  . furosemide (LASIX) 20 MG tablet TAKE 1 TABLET BY MOUTH EVERY DAY AS NEEDED 90 tablet 2  . lidocaine-prilocaine (EMLA) cream Apply 1 application topically as needed. 30 g 6  . metoprolol succinate (TOPROL-XL) 100 MG 24 hr tablet Take 1 1/2 tablets (150 mg total) daily. Take with or immediately following a meal. 135 tablet 2  . potassium chloride SA (K-DUR,KLOR-CON) 20 MEQ tablet Take 1 tablet (20 mEq total) by mouth daily. 30 tablet 11  . sacubitril-valsartan (ENTRESTO) 24-26 MG Take 1 tablet by mouth 2 (two) times daily. 180 tablet 3   No current facility-administered medications for this visit.     PHYSICAL EXAMINATION: ECOG PERFORMANCE STATUS: 1 - Symptomatic but completely ambulatory  Vitals:   02/16/19 1103  BP: (!) 149/83  Pulse: 62  Resp: 18  Temp: 98.3 F (36.8 C)  SpO2: 100%   Filed Weights   02/16/19 1103  Weight: 198 lb 3.2 oz (89.9 kg)    GENERAL:alert, no distress and comfortable SKIN: skin color, texture, turgor are normal, no rashes or significant lesions EYES: normal, Conjunctiva are pink and non-injected, sclera clear OROPHARYNX:no exudate, no erythema and lips, buccal mucosa, and tongue normal  NECK: supple, thyroid normal size, non-tender, without nodularity LYMPH: She has significant regression of the palpable lymph node on her right axilla LUNGS: clear to auscultation and percussion with normal breathing effort HEART: regular rate & rhythm and no murmurs and  no lower extremity edema ABDOMEN:abdomen soft, non-tender and normal bowel sounds Musculoskeletal:no cyanosis of digits and no clubbing  NEURO: alert & oriented x 3 with fluent speech, no focal motor/sensory deficits  LABORATORY DATA:  I have reviewed the data as listed    Component Value Date/Time   NA 144 02/16/2019 1040   NA 140 10/09/2017 1416   K 3.2 (L) 02/16/2019 1040   K 4.4 10/09/2017 1416   CL 111 02/16/2019 1040   CL 105 01/18/2013 0912   CO2 24 02/16/2019 1040   CO2 25 10/09/2017 1416   GLUCOSE 98 02/16/2019 1040   GLUCOSE 117 10/09/2017 1416   GLUCOSE 83 01/18/2013 0912   BUN 9 02/16/2019 1040   BUN 11.1 10/09/2017 1416   CREATININE 0.70 02/16/2019 1040   CREATININE 0.72 11/01/2018 0903   CREATININE 0.8 10/09/2017 1416   CALCIUM 8.9 02/16/2019 1040   CALCIUM 9.6 10/09/2017 1416   PROT 6.2 (L) 02/16/2019 1040   PROT 6.4 10/09/2017 1416   ALBUMIN 3.8 02/16/2019 1040   ALBUMIN 3.4 (L) 10/09/2017 1416   AST 14 (L) 02/16/2019 1040   AST 17 11/01/2018 0903   AST 18 10/09/2017 1416   ALT 12 02/16/2019 1040   ALT 15 11/01/2018 0903   ALT 19 10/09/2017 1416   ALKPHOS 74 02/16/2019 1040   ALKPHOS 69 10/09/2017 1416   BILITOT 1.1 02/16/2019 1040   BILITOT 1.3 (H) 11/01/2018 0903   BILITOT 0.57 10/09/2017 1416   GFRNONAA >60 02/16/2019 1040   GFRNONAA >60 11/01/2018 0903   GFRAA >60 02/16/2019 1040   GFRAA >60 11/01/2018 0903    No results found for: SPEP, UPEP  Lab Results  Component Value Date   WBC 4.1 02/16/2019   NEUTROABS 1.7 02/16/2019   HGB 11.3 (L) 02/16/2019   HCT 35.4 (L) 02/16/2019   MCV 89.4 02/16/2019   PLT 149 (L) 02/16/2019      Chemistry      Component Value Date/Time   NA 144 02/16/2019 1040   NA 140 10/09/2017 1416   K 3.2 (L) 02/16/2019 1040   K 4.4 10/09/2017 1416   CL 111 02/16/2019 1040   CL 105 01/18/2013 0912   CO2 24 02/16/2019 1040   CO2 25 10/09/2017 1416   BUN 9 02/16/2019 1040   BUN 11.1 10/09/2017 1416    CREATININE 0.70 02/16/2019 1040   CREATININE 0.72 11/01/2018 0903   CREATININE 0.8 10/09/2017 1416      Component Value Date/Time   CALCIUM 8.9 02/16/2019 1040   CALCIUM 9.6 10/09/2017 1416   ALKPHOS 74 02/16/2019 1040   ALKPHOS 69 10/09/2017 1416   AST 14 (L) 02/16/2019 1040   AST 17 11/01/2018 0903   AST 18 10/09/2017 1416   ALT 12 02/16/2019 1040   ALT 15 11/01/2018 0903   ALT 19 10/09/2017 1416   BILITOT 1.1 02/16/2019 1040   BILITOT 1.3 (H) 11/01/2018 0903   BILITOT 0.57 10/09/2017 1416       RADIOGRAPHIC STUDIES: I have personally reviewed the radiological images as listed and agreed with the findings in the report. [place] Axillary Node Core Biopsy Right  Result Date: 01/21/2019 INDICATION: 58 year old with CLL. Patient has enlarged lymph nodes and concern for lymphoma. EXAM: ULTRASOUND-GUIDED RIGHT  AXILLARY LYMPH NODE BIOPSY MEDICATIONS: None. ANESTHESIA/SEDATION: Moderate (conscious) sedation was employed during this procedure. A total of Versed 0.5 mg and Fentanyl 50 mcg was administered intravenously. Moderate Sedation Time: 17 minutes. The patient's level of consciousness and vital signs were monitored continuously by radiology nursing throughout the procedure under my direct supervision. FLUOROSCOPY TIME:  None COMPLICATIONS: None immediate. PROCEDURE: Informed written consent was obtained from the patient after a thorough discussion of the procedural risks, benefits and alternatives. All questions were addressed. A timeout was performed prior to the initiation of the procedure. Ultrasound demonstrated multiple enlarged lymph nodes in the right axilla. Right axilla was prepped chlorhexidine and sterile field was created. Skin and soft tissues were anesthetized with 1% lidocaine. 18 gauge core needle was directed into a large right axillary lymph node with ultrasound guidance. A total of 6 core biopsies were obtained. Specimens placed in saline. Bandage placed over the puncture site.  FINDINGS: Multiple enlarged right axillary lymph nodes. No bleeding or hematoma formation following the core biopsies. IMPRESSION: Successful ultrasound-guided core biopsies of an enlarged right axillary lymph node. Electronically Signed   By: Markus Daft M.D.   On: 01/21/2019 16:59    All questions were answered. The patient knows to call the clinic with any problems, questions or concerns. No barriers to learning was detected.  I spent 15 minutes counseling the patient face to face. The total time spent in the appointment was 20 minutes and more than 50% was on counseling and review of test results  Heath Lark, MD 02/17/2019 9:27 AM

## 2019-02-22 ENCOUNTER — Inpatient Hospital Stay: Payer: BLUE CROSS/BLUE SHIELD

## 2019-02-22 ENCOUNTER — Other Ambulatory Visit: Payer: Self-pay

## 2019-02-22 VITALS — BP 123/71 | HR 64 | Temp 98.5°F | Resp 18 | Ht 67.0 in | Wt 199.0 lb

## 2019-02-22 DIAGNOSIS — Z95828 Presence of other vascular implants and grafts: Secondary | ICD-10-CM

## 2019-02-22 DIAGNOSIS — C9110 Chronic lymphocytic leukemia of B-cell type not having achieved remission: Secondary | ICD-10-CM | POA: Diagnosis not present

## 2019-02-22 DIAGNOSIS — Z452 Encounter for adjustment and management of vascular access device: Secondary | ICD-10-CM

## 2019-02-22 DIAGNOSIS — Z79899 Other long term (current) drug therapy: Secondary | ICD-10-CM | POA: Diagnosis not present

## 2019-02-22 DIAGNOSIS — I428 Other cardiomyopathies: Secondary | ICD-10-CM | POA: Diagnosis not present

## 2019-02-22 DIAGNOSIS — Z5111 Encounter for antineoplastic chemotherapy: Secondary | ICD-10-CM | POA: Diagnosis not present

## 2019-02-22 DIAGNOSIS — D61818 Other pancytopenia: Secondary | ICD-10-CM | POA: Diagnosis not present

## 2019-02-22 DIAGNOSIS — Z7901 Long term (current) use of anticoagulants: Secondary | ICD-10-CM | POA: Diagnosis not present

## 2019-02-22 LAB — CBC WITH DIFFERENTIAL/PLATELET
Abs Immature Granulocytes: 0.01 10*3/uL (ref 0.00–0.07)
Basophils Absolute: 0 10*3/uL (ref 0.0–0.1)
Basophils Relative: 1 %
Eosinophils Absolute: 0 10*3/uL (ref 0.0–0.5)
Eosinophils Relative: 1 %
HCT: 36.5 % (ref 36.0–46.0)
Hemoglobin: 11.6 g/dL — ABNORMAL LOW (ref 12.0–15.0)
Immature Granulocytes: 0 %
Lymphocytes Relative: 39 %
Lymphs Abs: 1.5 10*3/uL (ref 0.7–4.0)
MCH: 28.6 pg (ref 26.0–34.0)
MCHC: 31.8 g/dL (ref 30.0–36.0)
MCV: 90.1 fL (ref 80.0–100.0)
Monocytes Absolute: 0.4 10*3/uL (ref 0.1–1.0)
Monocytes Relative: 9 %
Neutro Abs: 1.9 10*3/uL (ref 1.7–7.7)
Neutrophils Relative %: 50 %
Platelets: 141 10*3/uL — ABNORMAL LOW (ref 150–400)
RBC: 4.05 MIL/uL (ref 3.87–5.11)
RDW: 14 % (ref 11.5–15.5)
WBC: 3.9 10*3/uL — ABNORMAL LOW (ref 4.0–10.5)
nRBC: 0 % (ref 0.0–0.2)

## 2019-02-22 LAB — COMPREHENSIVE METABOLIC PANEL
ALT: 11 U/L (ref 0–44)
AST: 11 U/L — ABNORMAL LOW (ref 15–41)
Albumin: 3.8 g/dL (ref 3.5–5.0)
Alkaline Phosphatase: 73 U/L (ref 38–126)
Anion gap: 10 (ref 5–15)
BUN: 10 mg/dL (ref 6–20)
CO2: 25 mmol/L (ref 22–32)
Calcium: 9.2 mg/dL (ref 8.9–10.3)
Chloride: 108 mmol/L (ref 98–111)
Creatinine, Ser: 0.74 mg/dL (ref 0.44–1.00)
GFR calc Af Amer: >60 mL/min (ref >60)
GFR calc non Af Amer: >60 mL/min (ref >60)
Glucose, Bld: 114 mg/dL — ABNORMAL HIGH (ref 70–99)
Potassium: 3.8 mmol/L (ref 3.5–5.1)
Sodium: 143 mmol/L (ref 135–145)
Total Bilirubin: 0.8 mg/dL (ref 0.3–1.2)
Total Protein: 6.4 g/dL — ABNORMAL LOW (ref 6.5–8.1)

## 2019-02-22 LAB — LACTATE DEHYDROGENASE: LDH: 172 U/L (ref 98–192)

## 2019-02-22 LAB — URIC ACID: Uric Acid, Serum: 3.2 mg/dL (ref 2.5–7.1)

## 2019-02-22 MED ORDER — SODIUM CHLORIDE 0.9 % IV SOLN
1000.0000 mg | Freq: Once | INTRAVENOUS | Status: AC
  Administered 2019-02-22: 1000 mg via INTRAVENOUS
  Filled 2019-02-22: qty 40

## 2019-02-22 MED ORDER — SODIUM CHLORIDE 0.9% FLUSH
10.0000 mL | INTRAVENOUS | Status: DC | PRN
  Administered 2019-02-22: 10 mL
  Filled 2019-02-22: qty 10

## 2019-02-22 MED ORDER — SODIUM CHLORIDE 0.9% FLUSH
10.0000 mL | Freq: Once | INTRAVENOUS | Status: AC
  Administered 2019-02-22: 09:00:00 10 mL
  Filled 2019-02-22: qty 10

## 2019-02-22 MED ORDER — ACETAMINOPHEN 325 MG PO TABS
650.0000 mg | ORAL_TABLET | Freq: Once | ORAL | Status: AC
  Administered 2019-02-22: 10:00:00 650 mg via ORAL

## 2019-02-22 MED ORDER — DIPHENHYDRAMINE HCL 50 MG/ML IJ SOLN
50.0000 mg | Freq: Once | INTRAMUSCULAR | Status: AC
  Administered 2019-02-22: 10:00:00 50 mg via INTRAVENOUS

## 2019-02-22 MED ORDER — SODIUM CHLORIDE 0.9 % IV SOLN
Freq: Once | INTRAVENOUS | Status: AC
  Administered 2019-02-22: 10:00:00 via INTRAVENOUS
  Filled 2019-02-22: qty 250

## 2019-02-22 MED ORDER — HEPARIN SOD (PORK) LOCK FLUSH 100 UNIT/ML IV SOLN
500.0000 [IU] | Freq: Once | INTRAVENOUS | Status: AC | PRN
  Administered 2019-02-22: 16:00:00 500 [IU]
  Filled 2019-02-22: qty 5

## 2019-02-22 MED ORDER — SODIUM CHLORIDE 0.9 % IV SOLN
20.0000 mg | Freq: Once | INTRAVENOUS | Status: AC
  Administered 2019-02-22: 11:00:00 20 mg via INTRAVENOUS
  Filled 2019-02-22: qty 20

## 2019-02-22 MED ORDER — ACETAMINOPHEN 325 MG PO TABS
ORAL_TABLET | ORAL | Status: AC
  Filled 2019-02-22: qty 2

## 2019-02-22 MED ORDER — DIPHENHYDRAMINE HCL 50 MG/ML IJ SOLN
INTRAMUSCULAR | Status: AC
  Filled 2019-02-22: qty 1

## 2019-02-22 NOTE — Patient Instructions (Signed)
[facility] Discharge Instructions for Patients Receiving Chemotherapy  Today you received the following chemotherapy agents Gazyva  To help prevent nausea and vomiting after your treatment, we encourage you to take your nausea medication: As directed by MD   If you develop nausea and vomiting that is not controlled by your nausea medication, call the clinic.   BELOW ARE SYMPTOMS THAT SHOULD BE REPORTED IMMEDIATELY:  *FEVER GREATER THAN 100.5 F  *CHILLS WITH OR WITHOUT FEVER  NAUSEA AND VOMITING THAT IS NOT CONTROLLED WITH YOUR NAUSEA MEDICATION  *UNUSUAL SHORTNESS OF BREATH  *UNUSUAL BRUISING OR BLEEDING  TENDERNESS IN MOUTH AND THROAT WITH OR WITHOUT PRESENCE OF ULCERS  *URINARY PROBLEMS  *BOWEL PROBLEMS  UNUSUAL RASH Items with * indicate a potential emergency and should be followed up as soon as possible.  Feel free to call the clinic should you have any questions or concerns. The clinic phone number is [phone].  Please show the [facility] at check-in to the Emergency Department and triage nurse.  Coronavirus (COVID-19) Are you at risk?  Are you at risk for the Coronavirus (COVID-19)?  To be considered HIGH RISK for Coronavirus (COVID-19), you have to meet the following criteria:  . Traveled to [place], [place], [place], [place] or [place]; or in the [place] to [place], [place], [place], or [place]; and have fever, cough, and shortness of breath within the last 2 weeks of travel OR . Been in close contact with a person diagnosed with COVID-19 within the last 2 weeks and have fever, cough, and shortness of breath . IF YOU DO NOT MEET THESE CRITERIA, YOU ARE CONSIDERED LOW RISK FOR COVID-19.  What to do if you are HIGH RISK for COVID-19?  Marland Kitchen If you are having a medical emergency, call 911. . Seek medical care right away. Before you go to a doctor's office, urgent care or emergency department, call ahead and tell them about your  recent travel, contact with someone diagnosed with COVID-19, and your symptoms. You should receive instructions from your physician's office regarding next steps of care.  . When you arrive at healthcare provider, tell the healthcare staff immediately you have returned from visiting [place], [place], [place], [place] or [place]; or traveled in the [place] to [place], [place], [place], or [place]; in the last two weeks or you have been in close contact with a person diagnosed with COVID-19 in the last 2 weeks.   . Tell the health care staff about your symptoms: fever, cough and shortness of breath. . After you have been seen by a medical provider, you will be either: o Tested for (COVID-19) and discharged home on quarantine except to seek medical care if symptoms worsen, and asked to  - Stay home and avoid contact with others until you get your results (4-5 days)  - Avoid travel on public transportation if possible (such as bus, train, or airplane) or o Sent to the Emergency Department by EMS for evaluation, COVID-19 testing, and possible admission depending on your condition and test results.  What to do if you are LOW RISK for COVID-19?  Reduce your risk of any infection by using the same precautions used for avoiding the common cold or flu:  Marland Kitchen Wash your hands often with soap and warm water for at least 20 seconds.  If soap and water are not readily available, use an alcohol-based hand sanitizer with at least 60% alcohol.  . If coughing  or sneezing, cover your mouth and nose by coughing or sneezing into the elbow areas of your shirt or coat, into a tissue or into your sleeve (not your hands). . Avoid shaking hands with others and consider head nods or verbal greetings only. . Avoid touching your eyes, nose, or mouth with unwashed hands.  . Avoid close contact with people who are sick. . Avoid places or events with large numbers of people in one location, like concerts or sporting  events. . Carefully consider travel plans you have or are making. . If you are planning any travel outside or inside the [place], visit the CDC's Travelers' Health webpage for the latest health notices. . If you have some symptoms but not all symptoms, continue to monitor at home and seek medical attention if your symptoms worsen. . If you are having a medical emergency, call 911.   ADDITIONAL HEALTHCARE OPTIONS FOR PATIENTS   Telehealth / e-Visit: [email or web address]         [facility]: [phone]  [facility]: [phone]                   [facility]: [phone]     

## 2019-02-24 MED FILL — CALQUENCE 100 MG CAPSULE: 100 | 30 days supply | Qty: 60 | Fill #1

## 2019-03-04 ENCOUNTER — Other Ambulatory Visit: Payer: Self-pay | Admitting: Hematology and Oncology

## 2019-03-07 ENCOUNTER — Encounter: Payer: Self-pay | Admitting: Hematology and Oncology

## 2019-03-07 ENCOUNTER — Other Ambulatory Visit: Payer: Self-pay

## 2019-03-07 ENCOUNTER — Inpatient Hospital Stay: Payer: BC Managed Care – PPO

## 2019-03-07 ENCOUNTER — Inpatient Hospital Stay (HOSPITAL_BASED_OUTPATIENT_CLINIC_OR_DEPARTMENT_OTHER): Payer: BC Managed Care – PPO | Admitting: Hematology and Oncology

## 2019-03-07 ENCOUNTER — Inpatient Hospital Stay: Payer: BC Managed Care – PPO | Attending: Hematology and Oncology

## 2019-03-07 VITALS — BP 144/76 | HR 58 | Temp 98.5°F | Resp 18 | Ht 67.0 in | Wt 202.2 lb

## 2019-03-07 VITALS — BP 135/93 | HR 58 | Temp 98.0°F | Resp 18

## 2019-03-07 DIAGNOSIS — C9110 Chronic lymphocytic leukemia of B-cell type not having achieved remission: Secondary | ICD-10-CM

## 2019-03-07 DIAGNOSIS — D61818 Other pancytopenia: Secondary | ICD-10-CM | POA: Diagnosis not present

## 2019-03-07 DIAGNOSIS — Z5111 Encounter for antineoplastic chemotherapy: Secondary | ICD-10-CM | POA: Diagnosis not present

## 2019-03-07 DIAGNOSIS — Z95828 Presence of other vascular implants and grafts: Secondary | ICD-10-CM

## 2019-03-07 DIAGNOSIS — Z7901 Long term (current) use of anticoagulants: Secondary | ICD-10-CM | POA: Diagnosis not present

## 2019-03-07 DIAGNOSIS — Z79899 Other long term (current) drug therapy: Secondary | ICD-10-CM

## 2019-03-07 DIAGNOSIS — Z452 Encounter for adjustment and management of vascular access device: Secondary | ICD-10-CM

## 2019-03-07 DIAGNOSIS — I428 Other cardiomyopathies: Secondary | ICD-10-CM

## 2019-03-07 LAB — COMPREHENSIVE METABOLIC PANEL
ALT: 13 U/L (ref 0–44)
AST: 13 U/L — ABNORMAL LOW (ref 15–41)
Albumin: 3.6 g/dL (ref 3.5–5.0)
Alkaline Phosphatase: 67 U/L (ref 38–126)
Anion gap: 9 (ref 5–15)
BUN: 8 mg/dL (ref 6–20)
CO2: 25 mmol/L (ref 22–32)
Calcium: 8.7 mg/dL — ABNORMAL LOW (ref 8.9–10.3)
Chloride: 110 mmol/L (ref 98–111)
Creatinine, Ser: 0.75 mg/dL (ref 0.44–1.00)
GFR calc Af Amer: >60 mL/min (ref >60)
GFR calc non Af Amer: >60 mL/min (ref >60)
Glucose, Bld: 109 mg/dL — ABNORMAL HIGH (ref 70–99)
Potassium: 3.5 mmol/L (ref 3.5–5.1)
Sodium: 144 mmol/L (ref 135–145)
Total Bilirubin: 1.1 mg/dL (ref 0.3–1.2)
Total Protein: 6 g/dL — ABNORMAL LOW (ref 6.5–8.1)

## 2019-03-07 LAB — CBC WITH DIFFERENTIAL/PLATELET
Abs Immature Granulocytes: 0.01 10*3/uL (ref 0.00–0.07)
Basophils Absolute: 0 10*3/uL (ref 0.0–0.1)
Basophils Relative: 0 %
Eosinophils Absolute: 0.1 10*3/uL (ref 0.0–0.5)
Eosinophils Relative: 1 %
HCT: 34.9 % — ABNORMAL LOW (ref 36.0–46.0)
Hemoglobin: 10.9 g/dL — ABNORMAL LOW (ref 12.0–15.0)
Immature Granulocytes: 0 %
Lymphocytes Relative: 39 %
Lymphs Abs: 1.6 10*3/uL (ref 0.7–4.0)
MCH: 29 pg (ref 26.0–34.0)
MCHC: 31.2 g/dL (ref 30.0–36.0)
MCV: 92.8 fL (ref 80.0–100.0)
Monocytes Absolute: 0.4 10*3/uL (ref 0.1–1.0)
Monocytes Relative: 9 %
Neutro Abs: 2.1 10*3/uL (ref 1.7–7.7)
Neutrophils Relative %: 51 %
Platelets: 106 10*3/uL — ABNORMAL LOW (ref 150–400)
RBC: 3.76 MIL/uL — ABNORMAL LOW (ref 3.87–5.11)
RDW: 15.5 % (ref 11.5–15.5)
WBC: 4.2 10*3/uL (ref 4.0–10.5)
nRBC: 0 % (ref 0.0–0.2)

## 2019-03-07 LAB — LACTATE DEHYDROGENASE: LDH: 163 U/L (ref 98–192)

## 2019-03-07 MED ORDER — SODIUM CHLORIDE 0.9 % IV SOLN
20.0000 mg | Freq: Once | INTRAVENOUS | Status: AC
  Administered 2019-03-07: 20 mg via INTRAVENOUS
  Filled 2019-03-07: qty 2

## 2019-03-07 MED ORDER — ACETAMINOPHEN 325 MG PO TABS
ORAL_TABLET | ORAL | Status: AC
  Filled 2019-03-07: qty 2

## 2019-03-07 MED ORDER — SODIUM CHLORIDE 0.9 % IV SOLN
Freq: Once | INTRAVENOUS | Status: AC
  Administered 2019-03-07: 12:00:00 via INTRAVENOUS
  Filled 2019-03-07: qty 250

## 2019-03-07 MED ORDER — ACETAMINOPHEN 325 MG PO TABS
650.0000 mg | ORAL_TABLET | Freq: Once | ORAL | Status: AC
  Administered 2019-03-07: 12:00:00 650 mg via ORAL

## 2019-03-07 MED ORDER — DIPHENHYDRAMINE HCL 50 MG/ML IJ SOLN
INTRAMUSCULAR | Status: AC
  Filled 2019-03-07: qty 1

## 2019-03-07 MED ORDER — SODIUM CHLORIDE 0.9% FLUSH
10.0000 mL | Freq: Once | INTRAVENOUS | Status: AC
  Administered 2019-03-07: 10 mL
  Filled 2019-03-07: qty 10

## 2019-03-07 MED ORDER — SODIUM CHLORIDE 0.9% FLUSH
10.0000 mL | INTRAVENOUS | Status: DC | PRN
  Administered 2019-03-07: 10 mL
  Filled 2019-03-07: qty 10

## 2019-03-07 MED ORDER — SODIUM CHLORIDE 0.9 % IV SOLN
1000.0000 mg | Freq: Once | INTRAVENOUS | Status: AC
  Administered 2019-03-07: 1000 mg via INTRAVENOUS
  Filled 2019-03-07: qty 40

## 2019-03-07 MED ORDER — HEPARIN SOD (PORK) LOCK FLUSH 100 UNIT/ML IV SOLN
500.0000 [IU] | Freq: Once | INTRAVENOUS | Status: AC | PRN
  Administered 2019-03-07: 500 [IU]
  Filled 2019-03-07: qty 5

## 2019-03-07 MED ORDER — DIPHENHYDRAMINE HCL 50 MG/ML IJ SOLN
50.0000 mg | Freq: Once | INTRAMUSCULAR | Status: AC
  Administered 2019-03-07: 12:00:00 50 mg via INTRAVENOUS

## 2019-03-07 NOTE — Progress Notes (Signed)
[facility] OFFICE PROGRESS NOTE  Patient Care Team: Janith Lima, MD as PCP - General (Internal Medicine) Constance Haw, MD as PCP - Electrophysiology (Cardiology) Constance Haw, MD as Consulting Physician (Cardiology)  ASSESSMENT & PLAN:  CLL (chronic lymphocytic leukemia) ([place]) She tolerated treatment very well without major side effects She has complete resolution of right axillary lymphadenopathy We will proceed with treatment without delay I plan to repeat imaging study next month  Pancytopenia, acquired [facility]) She has mild intermittent pancytopenia due to treatment but not symptomatic Observe only for now.  Nonischemic cardiomyopathy (HCC) Her recent echocardiogram showed no evidence of changes in ejection fraction I will continue to observe closely She is doing well with no clinical signs or symptoms of congestive heart failure   Orders Placed This Encounter  Procedures  . CT ABDOMEN PELVIS W CONTRAST    Standing Status:   Future    Standing Expiration Date:   03/06/2020    Order Specific Question:   If indicated for the ordered procedure, I authorize the administration of contrast media per Radiology protocol    Answer:   Yes    Order Specific Question:   Preferred imaging location?    Answer:   [facility]    Order Specific Question:   Radiology Contrast Protocol - do NOT remove file path    Answer:   \\[email or web address]    Order Specific Question:   Is patient pregnant?    Answer:   No  . CT CHEST W CONTRAST    Standing Status:   Future    Standing Expiration Date:   03/06/2020    Order Specific Question:   If indicated for the ordered procedure, I authorize the administration of contrast media per Radiology protocol    Answer:   Yes    Order Specific Question:   Preferred imaging location?    Answer:   [facility]    Order Specific Question:   Radiology Contrast Protocol - do NOT remove file  path    Answer:   \\[email or web address]    Order Specific Question:   Is patient pregnant?    Answer:   No    INTERVAL HISTORY: Please see below for problem oriented charting. She returns for further follow-up She tolerated recent treatment well No new lymphadenopathy.  In fact, the right axillary lymphadenopathy has resolved No recent infection, fever or chills No recent signs or symptoms of congestive heart failure  SUMMARY OF ONCOLOGIC HISTORY:   CLL (chronic lymphocytic leukemia) ([place])   04/05/2015 Pathology Results    Accession: [id number] flow cytometry confirmed CLL. FISH was positive for p53 mutation    04/24/2015 Imaging    Extensive lymphadenopathy throughout the neck, chest (axilla), abdomen and pelvis, as detailed above, compatible with the reported clinical history of lymphoma. 2. Mild splenomegaly.    05/03/2015 - 08/27/2015 Chemotherapy    She started on Ibrutinib, discontinued prematurely when her prescription ran out    10/10/2015 - 05/29/2017 Chemotherapy    She was restarted back on Ibrutinib    11/13/2016 PET scan    Significant generalized reduction in size of numerous lymph nodes in the neck, chest, abdomen, and pelvis. Previously the activity of these nodes was low-level and in general a similar low-level activity is present today, significantly less than mediastinal blood pool activity, compatible with Deauville 2. 2. Coronary atherosclerosis. Mild cardiomegaly. 3. Mildly prominent endometrium for age without accentuated metabolic activity in the endometrium. Consider pelvic  sonography for further characterization.    05/27/2017 PET scan    1. Progressive hypermetabolic adenopathy, primarily involving cervical, axillary, pelvic and inguinal lymph nodes bilaterally, consistent with progressive lymphoma. 2. No solid visceral organ or osseous involvement.    06/16/2017 - 08/25/2017 Chemotherapy    The patient had 3 cycles of Rituximab and  Bendamustine    07/15/2017 - 07/19/2017 Hospital Admission    The patient was briefly admitted to the hospital due to infusion reaction to rituximab    09/18/2017 PET scan    1. Continued considerable adenopathy in the neck, chest, abdomen, and pelvis. This is generally stable in size but mildly reduced in activity compared to the prior exam. Current levels of activity primarily Deauville 3 and Deauville 4. No splenomegaly. 2. Diffuse new ground-glass opacities in the lungs with associated hypermetabolic activity. Some forms of lymphoma infiltration can rarely cause this pattern of diffuse ground-glass opacity and hypermetabolic activity. Differential diagnostic considerations might include atypical pneumonia such as mycoplasma, acute hypersensitivity pneumonitis, or acute eosinophilic pneumonia. Pulmonary hemorrhage seems less likely to cause this degree of accentuated metabolic activity.  3. Aortic Atherosclerosis (ICD10-I70.0). Coronary atherosclerosis.    09/24/2017 -  Chemotherapy    The patient had ventoclax for chemotherapy treatment.  Rituximab is added on 11/18/17 to 04/09/18, x 6 cycles    11/19/2017 PET scan    Overall mild interval decrease in hypermetabolic lymphadenopathy throughout the neck, chest, abdomen, and pelvis. No new or increased lymphadenopathy identified.  While there has been resolution of diffuse hypermetabolic bilateral ground-glass pulmonary opacity since prior study, there is a new 14 mm hypermetabolic pulmonary nodule in the posterior right lower lobe. Time course favors inflammatory or infectious etiology over neoplasm. Recommend continued follow-up by chest CT in 3 months.    02/16/2018 PET scan    1. Adenopathy in the neck, chest, and pelvis is stable to minimally reduced in size, and is moderately reduced in activity, primarily Deauville 2 disease today. There is a right common iliac lymph node qualifying as Deauville 3 disease which is stable in size but reduced  in activity. 2. Stable size but reduced activity in a pulmonary nodule in the right lower lobe. If this represents a leukemic lesion then a corresponds to Deauville 4 disease. This lesion was not readily apparent on 09/22/2017 but was reported on the prior chest CT of 11/19/2017. 3. Other imaging findings of potential clinical significance: Mild thyroid goiter. Aortic Atherosclerosis (ICD10-I70.0). Coronary atherosclerosis. Mild cardiomegaly.    04/19/2018 PET scan    1. Interval mild mixed metabolic changes, as detailed. Persistent mildly hypermetabolic bilateral axillary, mediastinal and bilateral pelvic adenopathy and mildly hypermetabolic right lower lobe pulmonary nodule compatible with lymphoproliferative disorder. Deauville 4 based on the subcarinal node. 2. Aortic Atherosclerosis (ICD10-I70.0).    06/29/2018 Procedure    Successful placement of a right internal jugular approach power injectable Port-A-Cath. The catheter is ready for immediate use.    07/26/2018 PET scan    1. Adenopathy primarily in the chest and pelvis as noted above, with size of the mildly enlarged lymph nodes stable to minimally increased, but with nodal activity generally decreased compared to the prior exam. Uptake in these nodes is primarily Deauville 2. 2. Aortic Atherosclerosis (ICD10-I70.0). Coronary atherosclerosis with mild cardiomegaly.    10/29/2018 PET scan    1. Relatively stable sized lymphadenopathy but interval increase in hypermetabolism when compared to the most recent prior PET-CT, as detailed above. 2. No new disease is identified. 3. Stable  bilateral pulmonary nodules.    11/12/2018 - 02/02/2019 Chemotherapy    The patient had riTUXimab (RITUXAN) 800 mg in sodium chloride 0.9 % 170 mL infusion, 375 mg/m2 = 800 mg, Intravenous,  Once, 3 of 4 cycles Administration: 800 mg (11/12/2018), 800 mg (12/10/2018), 800 mg (01/06/2019)  for chemotherapy treatment.     01/11/2019 Imaging    1. Significant increased  adenopathy in the chest, abdomen, and pelvis compared to the 10/28/2018 PET-CT. 2. The scattered tiny pulmonary nodules and larger single right lower lobe pulmonary nodule are all stable. 3. Other imaging findings of potential clinical significance: Aortic Atherosclerosis (ICD10-I70.0). Coronary atherosclerosis. Mild cardiomegaly. Mild diffuse thyroid prominence, stable. Mild left foraminal impingement at L4-5.    01/21/2019 Procedure    Successful ultrasound-guided core biopsies of an enlarged right axillary lymph node.     01/21/2019 Pathology Results    Lymph node, needle/core biopsy, Right Axilla - NON-HODGKIN B-CELL LYMPHOMA CONSISTENT WITH CHRONIC LYMPHOCYTIC LEUKEMIA/SMALL LYMPHOCYTIC LYMPHOMA - SEE COMMENT Microscopic Comment The biopsies are small core biopsies composed of a monotonous population of lymphocytes which are positive for CD20 without expression of cyclin-D1. The latter is important because it essentially rules out the possibility of mantle cell lymphoma. CD3 highlights a small population of T-cells. By flow cytometry, a kappa-restricted monoclonal B-cell population that expresses CD19, CD20, CD5, and CD23 comprises 89% of all lymphocytes. Overall, the features are consistent with chronic lymphocytic leukemia/small lymphocytic lymphoma.    01/31/2019 Echocardiogram     1. The left ventricle has normal systolic function, with an ejection fraction of 55-60%. The cavity size was normal. Left ventricular diastolic Doppler parameters are consistent with impaired relaxation. No evidence of left ventricular regional wall motion abnormalities. GLS -20%.  2. The right ventricle has normal systolic function. The cavity was normal. There is no increase in right ventricular wall thickness.  3. The aortic valve is tricuspid. Mild calcification of the aortic valve. No stenosis of the aortic valve.  4. The aortic root is normal in size and structure.  5. 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  6. No evidence of mitral valve stenosis. No significant mitral regurgitation.  7. Normal IVC size. No complete TR doppler jet so unable to estimate PA systolic pressure.    02/03/2019 -  Chemotherapy    The patient had obinutuzumab and Acalabrutinib for chemotherapy treatment.      REVIEW OF SYSTEMS:   Constitutional: Denies fevers, chills or abnormal weight loss Eyes: Denies blurriness of vision Ears, nose, mouth, throat, and face: Denies mucositis or sore throat Respiratory: Denies cough, dyspnea or wheezes Cardiovascular: Denies palpitation, chest discomfort or lower extremity swelling Gastrointestinal:  Denies nausea, heartburn or change in bowel habits Skin: Denies abnormal skin rashes Lymphatics: Denies new lymphadenopathy or easy bruising Neurological:Denies numbness, tingling or new weaknesses Behavioral/Psych: Mood is stable, no new changes  All other systems were reviewed with the patient and are negative.  I have reviewed the past medical history, past surgical history, social history and family history with the patient and they are unchanged from previous note.  ALLERGIES:  has No Known Allergies.  MEDICATIONS:  Current Outpatient Medications  Medication Sig Dispense Refill  . Acalabrutinib 100 MG CAPS Take 100 mg by mouth 2 (two) times daily. 60 capsule 11  . allopurinol (ZYLOPRIM) 300 MG tablet TAKE 1 TABLET BY MOUTH EVERY DAY 90 tablet 1  . Cholecalciferol (VITAMIN D-3) 1000 units CAPS Take 1,000 Units by mouth daily with  breakfast.    . ELIQUIS 5 MG TABS tablet TAKE 1 TABLET BY MOUTH TWICE A DAY 180 tablet 1  . furosemide (LASIX) 20 MG tablet TAKE 1 TABLET BY MOUTH EVERY DAY AS NEEDED 90 tablet 2  . lidocaine-prilocaine (EMLA) cream Apply 1 application topically as needed. 30 g 6  . metoprolol succinate (TOPROL-XL) 100 MG 24 hr tablet Take 1 1/2 tablets (150 mg total) daily. Take with or immediately following a meal. 135 tablet 2  . potassium  chloride SA (K-DUR,KLOR-CON) 20 MEQ tablet Take 1 tablet (20 mEq total) by mouth daily. 30 tablet 11  . sacubitril-valsartan (ENTRESTO) 24-26 MG Take 1 tablet by mouth 2 (two) times daily. 180 tablet 3   No current facility-administered medications for this visit.     PHYSICAL EXAMINATION: ECOG PERFORMANCE STATUS: 1 - Symptomatic but completely ambulatory  Vitals:   03/07/19 1055  BP: (!) 144/76  Pulse: (!) 58  Resp: 18  Temp: 98.5 F (36.9 C)  SpO2: 100%   Filed Weights   03/07/19 1055  Weight: 202 lb 3.2 oz (91.7 kg)    GENERAL:alert, no distress and comfortable SKIN: skin color, texture, turgor are normal, no rashes or significant lesions EYES: normal, Conjunctiva are pink and non-injected, sclera clear OROPHARYNX:no exudate, no erythema and lips, buccal mucosa, and tongue normal  NECK: supple, thyroid normal size, non-tender, without nodularity LYMPH:  no palpable lymphadenopathy in the cervical, axillary or inguinal LUNGS: clear to auscultation and percussion with normal breathing effort HEART: regular rate & rhythm and no murmurs and no lower extremity edema ABDOMEN:abdomen soft, non-tender and normal bowel sounds Musculoskeletal:no cyanosis of digits and no clubbing  NEURO: alert & oriented x 3 with fluent speech, no focal motor/sensory deficits  LABORATORY DATA:  I have reviewed the data as listed    Component Value Date/Time   NA 143 02/22/2019 0912   NA 140 10/09/2017 1416   K 3.8 02/22/2019 0912   K 4.4 10/09/2017 1416   CL 108 02/22/2019 0912   CL 105 01/18/2013 0912   CO2 25 02/22/2019 0912   CO2 25 10/09/2017 1416   GLUCOSE 114 (H) 02/22/2019 0912   GLUCOSE 117 10/09/2017 1416   GLUCOSE 83 01/18/2013 0912   BUN 10 02/22/2019 0912   BUN 11.1 10/09/2017 1416   CREATININE 0.74 02/22/2019 0912   CREATININE 0.72 11/01/2018 0903   CREATININE 0.8 10/09/2017 1416   CALCIUM 9.2 02/22/2019 0912   CALCIUM 9.6 10/09/2017 1416   PROT 6.4 (L) 02/22/2019 0912    PROT 6.4 10/09/2017 1416   ALBUMIN 3.8 02/22/2019 0912   ALBUMIN 3.4 (L) 10/09/2017 1416   AST 11 (L) 02/22/2019 0912   AST 17 11/01/2018 0903   AST 18 10/09/2017 1416   ALT 11 02/22/2019 0912   ALT 15 11/01/2018 0903   ALT 19 10/09/2017 1416   ALKPHOS 73 02/22/2019 0912   ALKPHOS 69 10/09/2017 1416   BILITOT 0.8 02/22/2019 0912   BILITOT 1.3 (H) 11/01/2018 0903   BILITOT 0.57 10/09/2017 1416   GFRNONAA >60 02/22/2019 0912   GFRNONAA >60 11/01/2018 0903   GFRAA >60 02/22/2019 0912   GFRAA >60 11/01/2018 0903    No results found for: SPEP, UPEP  Lab Results  Component Value Date   WBC 4.2 03/07/2019   NEUTROABS 2.1 03/07/2019   HGB 10.9 (L) 03/07/2019   HCT 34.9 (L) 03/07/2019   MCV 92.8 03/07/2019   PLT 106 (L) 03/07/2019      Chemistry  Component Value Date/Time   NA 143 02/22/2019 0912   NA 140 10/09/2017 1416   K 3.8 02/22/2019 0912   K 4.4 10/09/2017 1416   CL 108 02/22/2019 0912   CL 105 01/18/2013 0912   CO2 25 02/22/2019 0912   CO2 25 10/09/2017 1416   BUN 10 02/22/2019 0912   BUN 11.1 10/09/2017 1416   CREATININE 0.74 02/22/2019 0912   CREATININE 0.72 11/01/2018 0903   CREATININE 0.8 10/09/2017 1416      Component Value Date/Time   CALCIUM 9.2 02/22/2019 0912   CALCIUM 9.6 10/09/2017 1416   ALKPHOS 73 02/22/2019 0912   ALKPHOS 69 10/09/2017 1416   AST 11 (L) 02/22/2019 0912   AST 17 11/01/2018 0903   AST 18 10/09/2017 1416   ALT 11 02/22/2019 0912   ALT 15 11/01/2018 0903   ALT 19 10/09/2017 1416   BILITOT 0.8 02/22/2019 0912   BILITOT 1.3 (H) 11/01/2018 0903   BILITOT 0.57 10/09/2017 1416       All questions were answered. The patient knows to call the clinic with any problems, questions or concerns. No barriers to learning was detected.  I spent 15 minutes counseling the patient face to face. The total time spent in the appointment was 20 minutes and more than 50% was on counseling and review of test results  Heath Lark,  MD 03/07/2019 11:15 AM

## 2019-03-07 NOTE — Assessment & Plan Note (Signed)
Her recent echocardiogram showed no evidence of changes in ejection fraction I will continue to observe closely She is doing well with no clinical signs or symptoms of congestive heart failure

## 2019-03-07 NOTE — Assessment & Plan Note (Signed)
She has mild intermittent pancytopenia due to treatment but not symptomatic Observe only for now.

## 2019-03-07 NOTE — Patient Instructions (Signed)
[facility] Discharge Instructions for Patients Receiving Chemotherapy  Today you received the following chemotherapy agents: Gazyva   To help prevent nausea and vomiting after your treatment, we encourage you to take your nausea medication as directed.    If you develop nausea and vomiting that is not controlled by your nausea medication, call the clinic.   BELOW ARE SYMPTOMS THAT SHOULD BE REPORTED IMMEDIATELY:  *FEVER GREATER THAN 100.5 F  *CHILLS WITH OR WITHOUT FEVER  NAUSEA AND VOMITING THAT IS NOT CONTROLLED WITH YOUR NAUSEA MEDICATION  *UNUSUAL SHORTNESS OF BREATH  *UNUSUAL BRUISING OR BLEEDING  TENDERNESS IN MOUTH AND THROAT WITH OR WITHOUT PRESENCE OF ULCERS  *URINARY PROBLEMS  *BOWEL PROBLEMS  UNUSUAL RASH Items with * indicate a potential emergency and should be followed up as soon as possible.  Feel free to call the clinic should you have any questions or concerns. The clinic phone number is [phone].  Please show the CHEMO ALERT CARD at check-in to the Emergency Department and triage nurse.   

## 2019-03-07 NOTE — Assessment & Plan Note (Signed)
She tolerated treatment very well without major side effects She has complete resolution of right axillary lymphadenopathy We will proceed with treatment without delay I plan to repeat imaging study next month

## 2019-03-08 ENCOUNTER — Telehealth: Payer: Self-pay | Admitting: Hematology and Oncology

## 2019-03-08 NOTE — Telephone Encounter (Signed)
I could not reach but I did mail schedule

## 2019-03-24 MED FILL — CALQUENCE 100 MG CAPSULE: 100 | 30 days supply | Qty: 60 | Fill #2

## 2019-03-31 ENCOUNTER — Other Ambulatory Visit (HOSPITAL_COMMUNITY): Payer: Self-pay

## 2019-04-04 ENCOUNTER — Inpatient Hospital Stay: Payer: BC Managed Care – PPO

## 2019-04-04 ENCOUNTER — Encounter: Payer: Self-pay | Admitting: Hematology and Oncology

## 2019-04-04 ENCOUNTER — Other Ambulatory Visit: Payer: Self-pay | Admitting: Hematology and Oncology

## 2019-04-04 ENCOUNTER — Other Ambulatory Visit: Payer: Self-pay

## 2019-04-04 ENCOUNTER — Inpatient Hospital Stay (HOSPITAL_BASED_OUTPATIENT_CLINIC_OR_DEPARTMENT_OTHER): Payer: BC Managed Care – PPO | Admitting: Hematology and Oncology

## 2019-04-04 VITALS — BP 135/89 | HR 59 | Temp 97.4°F | Resp 18

## 2019-04-04 DIAGNOSIS — C9110 Chronic lymphocytic leukemia of B-cell type not having achieved remission: Secondary | ICD-10-CM

## 2019-04-04 DIAGNOSIS — Z79899 Other long term (current) drug therapy: Secondary | ICD-10-CM | POA: Diagnosis not present

## 2019-04-04 DIAGNOSIS — D61818 Other pancytopenia: Secondary | ICD-10-CM

## 2019-04-04 DIAGNOSIS — Z7901 Long term (current) use of anticoagulants: Secondary | ICD-10-CM | POA: Diagnosis not present

## 2019-04-04 DIAGNOSIS — I428 Other cardiomyopathies: Secondary | ICD-10-CM | POA: Diagnosis not present

## 2019-04-04 DIAGNOSIS — Z452 Encounter for adjustment and management of vascular access device: Secondary | ICD-10-CM

## 2019-04-04 DIAGNOSIS — Z5111 Encounter for antineoplastic chemotherapy: Secondary | ICD-10-CM | POA: Diagnosis not present

## 2019-04-04 DIAGNOSIS — Z95828 Presence of other vascular implants and grafts: Secondary | ICD-10-CM

## 2019-04-04 LAB — CBC WITH DIFFERENTIAL/PLATELET
Abs Immature Granulocytes: 0.49 10*3/uL — ABNORMAL HIGH (ref 0.00–0.07)
Basophils Absolute: 0 10*3/uL (ref 0.0–0.1)
Basophils Relative: 1 %
Eosinophils Absolute: 0 10*3/uL (ref 0.0–0.5)
Eosinophils Relative: 1 %
HCT: 35.1 % — ABNORMAL LOW (ref 36.0–46.0)
Hemoglobin: 11 g/dL — ABNORMAL LOW (ref 12.0–15.0)
Immature Granulocytes: 11 %
Lymphocytes Relative: 38 %
Lymphs Abs: 1.8 10*3/uL (ref 0.7–4.0)
MCH: 29.4 pg (ref 26.0–34.0)
MCHC: 31.3 g/dL (ref 30.0–36.0)
MCV: 93.9 fL (ref 80.0–100.0)
Monocytes Absolute: 0.6 10*3/uL (ref 0.1–1.0)
Monocytes Relative: 14 %
Neutro Abs: 1.6 10*3/uL — ABNORMAL LOW (ref 1.7–7.7)
Neutrophils Relative %: 35 %
Platelets: 128 10*3/uL — ABNORMAL LOW (ref 150–400)
RBC: 3.74 MIL/uL — ABNORMAL LOW (ref 3.87–5.11)
RDW: 16.1 % — ABNORMAL HIGH (ref 11.5–15.5)
WBC: 4.6 10*3/uL (ref 4.0–10.5)
nRBC: 0 % (ref 0.0–0.2)

## 2019-04-04 LAB — COMPREHENSIVE METABOLIC PANEL
ALT: 13 U/L (ref 0–44)
AST: 14 U/L — ABNORMAL LOW (ref 15–41)
Albumin: 3.9 g/dL (ref 3.5–5.0)
Alkaline Phosphatase: 67 U/L (ref 38–126)
Anion gap: 9 (ref 5–15)
BUN: 11 mg/dL (ref 6–20)
CO2: 25 mmol/L (ref 22–32)
Calcium: 8.9 mg/dL (ref 8.9–10.3)
Chloride: 110 mmol/L (ref 98–111)
Creatinine, Ser: 0.78 mg/dL (ref 0.44–1.00)
GFR calc Af Amer: >60 mL/min (ref >60)
GFR calc non Af Amer: >60 mL/min (ref >60)
Glucose, Bld: 111 mg/dL — ABNORMAL HIGH (ref 70–99)
Potassium: 4.1 mmol/L (ref 3.5–5.1)
Sodium: 144 mmol/L (ref 135–145)
Total Bilirubin: 1 mg/dL (ref 0.3–1.2)
Total Protein: 6.3 g/dL — ABNORMAL LOW (ref 6.5–8.1)

## 2019-04-04 LAB — LACTATE DEHYDROGENASE: LDH: 198 U/L — ABNORMAL HIGH (ref 98–192)

## 2019-04-04 MED ORDER — DIPHENHYDRAMINE HCL 50 MG/ML IJ SOLN
50.0000 mg | Freq: Once | INTRAMUSCULAR | Status: AC
  Administered 2019-04-04: 50 mg via INTRAVENOUS

## 2019-04-04 MED ORDER — SODIUM CHLORIDE 0.9 % IV SOLN
Freq: Once | INTRAVENOUS | Status: AC
  Administered 2019-04-04: 10:00:00 via INTRAVENOUS
  Filled 2019-04-04: qty 250

## 2019-04-04 MED ORDER — ACETAMINOPHEN 325 MG PO TABS
650.0000 mg | ORAL_TABLET | Freq: Once | ORAL | Status: AC
  Administered 2019-04-04: 650 mg via ORAL

## 2019-04-04 MED ORDER — SODIUM CHLORIDE 0.9 % IV SOLN
20.0000 mg | Freq: Once | INTRAVENOUS | Status: AC
  Administered 2019-04-04: 20 mg via INTRAVENOUS
  Filled 2019-04-04: qty 20

## 2019-04-04 MED ORDER — SODIUM CHLORIDE 0.9 % IV SOLN
1000.0000 mg | Freq: Once | INTRAVENOUS | Status: AC
  Administered 2019-04-04: 1000 mg via INTRAVENOUS
  Filled 2019-04-04: qty 40

## 2019-04-04 MED ORDER — SODIUM CHLORIDE 0.9% FLUSH
10.0000 mL | Freq: Once | INTRAVENOUS | Status: AC
  Administered 2019-04-04: 10 mL
  Filled 2019-04-04: qty 10

## 2019-04-04 MED ORDER — SODIUM CHLORIDE 0.9% FLUSH
10.0000 mL | INTRAVENOUS | Status: DC | PRN
  Administered 2019-04-04: 10 mL
  Filled 2019-04-04: qty 10

## 2019-04-04 MED ORDER — HEPARIN SOD (PORK) LOCK FLUSH 100 UNIT/ML IV SOLN
500.0000 [IU] | Freq: Once | INTRAVENOUS | Status: AC | PRN
  Administered 2019-04-04: 15:00:00 500 [IU]
  Filled 2019-04-04: qty 5

## 2019-04-04 MED ORDER — ACETAMINOPHEN 325 MG PO TABS
ORAL_TABLET | ORAL | Status: AC
  Filled 2019-04-04: qty 2

## 2019-04-04 MED ORDER — DIPHENHYDRAMINE HCL 50 MG/ML IJ SOLN
INTRAMUSCULAR | Status: AC
  Filled 2019-04-04: qty 1

## 2019-04-04 NOTE — Assessment & Plan Note (Signed)
She has mild intermittent pancytopenia due to treatment but not symptomatic Observe only for now.

## 2019-04-04 NOTE — Assessment & Plan Note (Signed)
She tolerated treatment very well without major side effects She has complete resolution of right axillary lymphadenopathy We will proceed with treatment without delay I plan to repeat imaging study next month

## 2019-04-04 NOTE — Assessment & Plan Note (Signed)
Her recent echocardiogram showed no evidence of changes in ejection fraction I will continue to observe closely She is doing well with no clinical signs or symptoms of congestive heart failure

## 2019-04-04 NOTE — Patient Instructions (Signed)
[facility] Discharge Instructions for Patients Receiving Chemotherapy  Today you received the following chemotherapy agents: Gazyva   To help prevent nausea and vomiting after your treatment, we encourage you to take your nausea medication as directed.    If you develop nausea and vomiting that is not controlled by your nausea medication, call the clinic.   BELOW ARE SYMPTOMS THAT SHOULD BE REPORTED IMMEDIATELY:  *FEVER GREATER THAN 100.5 F  *CHILLS WITH OR WITHOUT FEVER  NAUSEA AND VOMITING THAT IS NOT CONTROLLED WITH YOUR NAUSEA MEDICATION  *UNUSUAL SHORTNESS OF BREATH  *UNUSUAL BRUISING OR BLEEDING  TENDERNESS IN MOUTH AND THROAT WITH OR WITHOUT PRESENCE OF ULCERS  *URINARY PROBLEMS  *BOWEL PROBLEMS  UNUSUAL RASH Items with * indicate a potential emergency and should be followed up as soon as possible.  Feel free to call the clinic should you have any questions or concerns. The clinic phone number is [phone].  Please show the CHEMO ALERT CARD at check-in to the Emergency Department and triage nurse.   

## 2019-04-04 NOTE — Progress Notes (Signed)
[facility] OFFICE PROGRESS NOTE  Patient Care Team: Janith Lima, MD as PCP - General (Internal Medicine) Constance Haw, MD as PCP - Electrophysiology (Cardiology) Constance Haw, MD as Consulting Physician (Cardiology)  ASSESSMENT & PLAN:  CLL (chronic lymphocytic leukemia) ([place]) She tolerated treatment very well without major side effects She has complete resolution of right axillary lymphadenopathy We will proceed with treatment without delay I plan to repeat imaging study next month  Pancytopenia, acquired [facility]) She has mild intermittent pancytopenia due to treatment but not symptomatic Observe only for now.  Nonischemic cardiomyopathy (HCC) Her recent echocardiogram showed no evidence of changes in ejection fraction I will continue to observe closely She is doing well with no clinical signs or symptoms of congestive heart failure   No orders of the defined types were placed in this encounter.   INTERVAL HISTORY: Please see below for problem oriented charting. She returns for further follow-up She denies new lymphadenopathy No recent infection, fever or chills She has no side effects from treatment so far No recent signs or symptoms of congestive heart failure  SUMMARY OF ONCOLOGIC HISTORY: Oncology History  CLL (chronic lymphocytic leukemia) ([place])  04/05/2015 Pathology Results   Accession: [id number] flow cytometry confirmed CLL. FISH was positive for p53 mutation   04/24/2015 Imaging   Extensive lymphadenopathy throughout the neck, chest (axilla), abdomen and pelvis, as detailed above, compatible with the reported clinical history of lymphoma. 2. Mild splenomegaly.   05/03/2015 - 08/27/2015 Chemotherapy   She started on Ibrutinib, discontinued prematurely when her prescription ran out   10/10/2015 - 05/29/2017 Chemotherapy   She was restarted back on Ibrutinib   11/13/2016 PET scan   Significant generalized reduction in size of numerous  lymph nodes in the neck, chest, abdomen, and pelvis. Previously the activity of these nodes was low-level and in general a similar low-level activity is present today, significantly less than mediastinal blood pool activity, compatible with Deauville 2. 2. Coronary atherosclerosis. Mild cardiomegaly. 3. Mildly prominent endometrium for age without accentuated metabolic activity in the endometrium. Consider pelvic sonography for further characterization.   05/27/2017 PET scan   1. Progressive hypermetabolic adenopathy, primarily involving cervical, axillary, pelvic and inguinal lymph nodes bilaterally, consistent with progressive lymphoma. 2. No solid visceral organ or osseous involvement.   06/16/2017 - 08/25/2017 Chemotherapy   The patient had 3 cycles of Rituximab and Bendamustine   07/15/2017 - 07/19/2017 Hospital Admission   The patient was briefly admitted to the hospital due to infusion reaction to rituximab   09/18/2017 PET scan   1. Continued considerable adenopathy in the neck, chest, abdomen, and pelvis. This is generally stable in size but mildly reduced in activity compared to the prior exam. Current levels of activity primarily Deauville 3 and Deauville 4. No splenomegaly. 2. Diffuse new ground-glass opacities in the lungs with associated hypermetabolic activity. Some forms of lymphoma infiltration can rarely cause this pattern of diffuse ground-glass opacity and hypermetabolic activity. Differential diagnostic considerations might include atypical pneumonia such as mycoplasma, acute hypersensitivity pneumonitis, or acute eosinophilic pneumonia. Pulmonary hemorrhage seems less likely to cause this degree of accentuated metabolic activity.  3. Aortic Atherosclerosis (ICD10-I70.0). Coronary atherosclerosis.   09/24/2017 -  Chemotherapy   The patient had ventoclax for chemotherapy treatment.  Rituximab is added on 11/18/17 to 04/09/18, x 6 cycles   11/19/2017 PET scan   Overall mild  interval decrease in hypermetabolic lymphadenopathy throughout the neck, chest, abdomen, and pelvis. No new or increased lymphadenopathy  identified.  While there has been resolution of diffuse hypermetabolic bilateral ground-glass pulmonary opacity since prior study, there is a new 14 mm hypermetabolic pulmonary nodule in the posterior right lower lobe. Time course favors inflammatory or infectious etiology over neoplasm. Recommend continued follow-up by chest CT in 3 months.   02/16/2018 PET scan   1. Adenopathy in the neck, chest, and pelvis is stable to minimally reduced in size, and is moderately reduced in activity, primarily Deauville 2 disease today. There is a right common iliac lymph node qualifying as Deauville 3 disease which is stable in size but reduced in activity. 2. Stable size but reduced activity in a pulmonary nodule in the right lower lobe. If this represents a leukemic lesion then a corresponds to Deauville 4 disease. This lesion was not readily apparent on 09/22/2017 but was reported on the prior chest CT of 11/19/2017. 3. Other imaging findings of potential clinical significance: Mild thyroid goiter. Aortic Atherosclerosis (ICD10-I70.0). Coronary atherosclerosis. Mild cardiomegaly.   04/19/2018 PET scan   1. Interval mild mixed metabolic changes, as detailed. Persistent mildly hypermetabolic bilateral axillary, mediastinal and bilateral pelvic adenopathy and mildly hypermetabolic right lower lobe pulmonary nodule compatible with lymphoproliferative disorder. Deauville 4 based on the subcarinal node. 2. Aortic Atherosclerosis (ICD10-I70.0).   06/29/2018 Procedure   Successful placement of a right internal jugular approach power injectable Port-A-Cath. The catheter is ready for immediate use.   07/26/2018 PET scan   1. Adenopathy primarily in the chest and pelvis as noted above, with size of the mildly enlarged lymph nodes stable to minimally increased, but with nodal activity  generally decreased compared to the prior exam. Uptake in these nodes is primarily Deauville 2. 2. Aortic Atherosclerosis (ICD10-I70.0). Coronary atherosclerosis with mild cardiomegaly.   10/29/2018 PET scan   1. Relatively stable sized lymphadenopathy but interval increase in hypermetabolism when compared to the most recent prior PET-CT, as detailed above. 2. No new disease is identified. 3. Stable bilateral pulmonary nodules.   11/12/2018 - 02/02/2019 Chemotherapy   The patient had riTUXimab (RITUXAN) 800 mg in sodium chloride 0.9 % 170 mL infusion, 375 mg/m2 = 800 mg, Intravenous,  Once, 3 of 4 cycles Administration: 800 mg (11/12/2018), 800 mg (12/10/2018), 800 mg (01/06/2019)  for chemotherapy treatment.    01/11/2019 Imaging   1. Significant increased adenopathy in the chest, abdomen, and pelvis compared to the 10/28/2018 PET-CT. 2. The scattered tiny pulmonary nodules and larger single right lower lobe pulmonary nodule are all stable. 3. Other imaging findings of potential clinical significance: Aortic Atherosclerosis (ICD10-I70.0). Coronary atherosclerosis. Mild cardiomegaly. Mild diffuse thyroid prominence, stable. Mild left foraminal impingement at L4-5.   01/21/2019 Procedure   Successful ultrasound-guided core biopsies of an enlarged right axillary lymph node.    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 The latter is important because it essentially rules out the possibility of mantle cell lymphoma. CD3 highlights a small population of T-cells. By flow cytometry, a kappa-restricted monoclonal B-cell population that expresses CD19, CD20, CD5, and CD23 comprises 89% of all  lymphocytes. Overall, the features are consistent with chronic lymphocytic leukemia/small lymphocytic lymphoma.   01/31/2019 Echocardiogram    1. The left ventricle has normal systolic function, with an ejection fraction of 55-60%. The cavity size  was normal. Left ventricular diastolic Doppler parameters are consistent with impaired relaxation. No evidence of left ventricular regional wall motion abnormalities. GLS -20%.  2. The right ventricle has normal systolic function. The cavity was normal. There is no increase in right ventricular wall thickness.  3. The aortic valve is tricuspid. Mild calcification of the aortic valve. No stenosis of the aortic valve.  4. The aortic root is normal in size and structure.  5. 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  6. No evidence of mitral valve stenosis. No significant mitral regurgitation.  7. Normal IVC size. No complete TR doppler jet so unable to estimate PA systolic pressure.   02/03/2019 -  Chemotherapy   The patient had obinutuzumab and Acalabrutinib for chemotherapy treatment.      REVIEW OF SYSTEMS:   Constitutional: Denies fevers, chills or abnormal weight loss Eyes: Denies blurriness of vision Ears, nose, mouth, throat, and face: Denies mucositis or sore throat Respiratory: Denies cough, dyspnea or wheezes Cardiovascular: Denies palpitation, chest discomfort or lower extremity swelling Gastrointestinal:  Denies nausea, heartburn or change in bowel habits Skin: Denies abnormal skin rashes Lymphatics: Denies new lymphadenopathy or easy bruising Neurological:Denies numbness, tingling or new weaknesses Behavioral/Psych: Mood is stable, no new changes  All other systems were reviewed with the patient and are negative.  I have reviewed the past medical history, past surgical history, social history and family history with the patient and they are unchanged from previous note.  ALLERGIES:  has No Known  Allergies.  MEDICATIONS:  Current Outpatient Medications  Medication Sig Dispense Refill  . Acalabrutinib 100 MG CAPS Take 100 mg by mouth 2 (two) times daily. 60 capsule 11  . allopurinol (ZYLOPRIM) 300 MG tablet TAKE 1 TABLET BY MOUTH EVERY DAY 90 tablet 1  . Cholecalciferol (VITAMIN D-3) 1000 units CAPS Take 1,000 Units by mouth daily with breakfast.    . ELIQUIS 5 MG TABS tablet TAKE 1 TABLET BY MOUTH TWICE A DAY 180 tablet 1  . furosemide (LASIX) 20 MG tablet TAKE 1 TABLET BY MOUTH EVERY DAY AS NEEDED 90 tablet 2  . lidocaine-prilocaine (EMLA) cream Apply 1 application topically as needed. 30 g 6  . metoprolol succinate (TOPROL-XL) 100 MG 24 hr tablet Take 1 1/2 tablets (150 mg total) daily. Take with or immediately following a meal. 135 tablet 2  . potassium chloride SA (K-DUR,KLOR-CON) 20 MEQ tablet Take 1 tablet (20 mEq total) by mouth daily. 30 tablet 11  . sacubitril-valsartan (ENTRESTO) 24-26 MG Take 1 tablet by mouth 2 (two) times daily. 180 tablet 3   No current facility-administered medications for this visit.     PHYSICAL EXAMINATION: ECOG PERFORMANCE STATUS: 0 - Asymptomatic  Vitals:   04/04/19 0931  BP: 140/72  Pulse: 60  Resp: 18  Temp: 98.7 F (37.1 C)  SpO2: 100%   Filed Weights   04/04/19 0931  Weight: 204 lb (92.5 kg)    GENERAL:alert, no distress and comfortable SKIN: skin color, texture, turgor are normal, no rashes or significant lesions EYES: normal, Conjunctiva are pink and non-injected, sclera clear OROPHARYNX:no exudate, no erythema and lips, buccal mucosa, and tongue normal  NECK: supple, thyroid normal size, non-tender, without nodularity LYMPH:  no palpable lymphadenopathy in the cervical, axillary or inguinal LUNGS: clear to auscultation and percussion with normal breathing effort HEART: regular rate & rhythm and no murmurs and no lower extremity edema ABDOMEN:abdomen soft, non-tender and normal bowel sounds Musculoskeletal:no cyanosis of  digits and  no clubbing  NEURO: alert & oriented x 3 with fluent speech, no focal motor/sensory deficits  LABORATORY DATA:  I have reviewed the data as listed    Component Value Date/Time   NA 144 03/07/2019 1050   NA 140 10/09/2017 1416   K 3.5 03/07/2019 1050   K 4.4 10/09/2017 1416   CL 110 03/07/2019 1050   CL 105 01/18/2013 0912   CO2 25 03/07/2019 1050   CO2 25 10/09/2017 1416   GLUCOSE 109 (H) 03/07/2019 1050   GLUCOSE 117 10/09/2017 1416   GLUCOSE 83 01/18/2013 0912   BUN 8 03/07/2019 1050   BUN 11.1 10/09/2017 1416   CREATININE 0.75 03/07/2019 1050   CREATININE 0.72 11/01/2018 0903   CREATININE 0.8 10/09/2017 1416   CALCIUM 8.7 (L) 03/07/2019 1050   CALCIUM 9.6 10/09/2017 1416   PROT 6.0 (L) 03/07/2019 1050   PROT 6.4 10/09/2017 1416   ALBUMIN 3.6 03/07/2019 1050   ALBUMIN 3.4 (L) 10/09/2017 1416   AST 13 (L) 03/07/2019 1050   AST 17 11/01/2018 0903   AST 18 10/09/2017 1416   ALT 13 03/07/2019 1050   ALT 15 11/01/2018 0903   ALT 19 10/09/2017 1416   ALKPHOS 67 03/07/2019 1050   ALKPHOS 69 10/09/2017 1416   BILITOT 1.1 03/07/2019 1050   BILITOT 1.3 (H) 11/01/2018 0903   BILITOT 0.57 10/09/2017 1416   GFRNONAA >60 03/07/2019 1050   GFRNONAA >60 11/01/2018 0903   GFRAA >60 03/07/2019 1050   GFRAA >60 11/01/2018 0903    No results found for: SPEP, UPEP  Lab Results  Component Value Date   WBC 4.6 04/04/2019   NEUTROABS PENDING 04/04/2019   HGB 11.0 (L) 04/04/2019   HCT 35.1 (L) 04/04/2019   MCV 93.9 04/04/2019   PLT 128 (L) 04/04/2019      Chemistry      Component Value Date/Time   NA 144 03/07/2019 1050   NA 140 10/09/2017 1416   K 3.5 03/07/2019 1050   K 4.4 10/09/2017 1416   CL 110 03/07/2019 1050   CL 105 01/18/2013 0912   CO2 25 03/07/2019 1050   CO2 25 10/09/2017 1416   BUN 8 03/07/2019 1050   BUN 11.1 10/09/2017 1416   CREATININE 0.75 03/07/2019 1050   CREATININE 0.72 11/01/2018 0903   CREATININE 0.8 10/09/2017 1416      Component  Value Date/Time   CALCIUM 8.7 (L) 03/07/2019 1050   CALCIUM 9.6 10/09/2017 1416   ALKPHOS 67 03/07/2019 1050   ALKPHOS 69 10/09/2017 1416   AST 13 (L) 03/07/2019 1050   AST 17 11/01/2018 0903   AST 18 10/09/2017 1416   ALT 13 03/07/2019 1050   ALT 15 11/01/2018 0903   ALT 19 10/09/2017 1416   BILITOT 1.1 03/07/2019 1050   BILITOT 1.3 (H) 11/01/2018 0903   BILITOT 0.57 10/09/2017 1416      All questions were answered. The patient knows to call the clinic with any problems, questions or concerns. No barriers to learning was detected.  I spent 15 minutes counseling the patient face to face. The total time spent in the appointment was 20 minutes and more than 50% was on counseling and review of test results  Heath Lark, MD 04/04/2019 9:50 AM

## 2019-04-25 MED FILL — CALQUENCE 100 MG CAPSULE: 100 | 30 days supply | Qty: 60 | Fill #3

## 2019-04-27 ENCOUNTER — Other Ambulatory Visit: Payer: Self-pay | Admitting: Cardiology

## 2019-04-27 NOTE — Telephone Encounter (Signed)
Pt last saw Dr Curt Bears 11/05/18, last labs 04/04/19 Creat 0.78, age 58, weight 92.5kg, based on specified criteria pt is on appropriate dosage of Eliquis 5mg  BID.  Will refill rx.

## 2019-05-02 ENCOUNTER — Ambulatory Visit (HOSPITAL_COMMUNITY)
Admission: RE | Admit: 2019-05-02 | Discharge: 2019-05-02 | Disposition: A | Discharge status: Home / Self Care | Payer: BC Managed Care – PPO | Source: Ambulatory Visit | Attending: Hematology and Oncology | Admitting: Hematology and Oncology

## 2019-05-02 ENCOUNTER — Inpatient Hospital Stay: Payer: BC Managed Care – PPO | Attending: Hematology and Oncology

## 2019-05-02 ENCOUNTER — Inpatient Hospital Stay: Payer: BC Managed Care – PPO

## 2019-05-02 ENCOUNTER — Other Ambulatory Visit: Payer: Self-pay

## 2019-05-02 DIAGNOSIS — Z79899 Other long term (current) drug therapy: Secondary | ICD-10-CM | POA: Diagnosis not present

## 2019-05-02 DIAGNOSIS — Z5111 Encounter for antineoplastic chemotherapy: Secondary | ICD-10-CM | POA: Insufficient documentation

## 2019-05-02 DIAGNOSIS — C9110 Chronic lymphocytic leukemia of B-cell type not having achieved remission: Secondary | ICD-10-CM | POA: Diagnosis not present

## 2019-05-02 DIAGNOSIS — I428 Other cardiomyopathies: Secondary | ICD-10-CM | POA: Diagnosis not present

## 2019-05-02 DIAGNOSIS — D61818 Other pancytopenia: Secondary | ICD-10-CM | POA: Diagnosis not present

## 2019-05-02 DIAGNOSIS — D702 Other drug-induced agranulocytosis: Secondary | ICD-10-CM | POA: Diagnosis not present

## 2019-05-02 DIAGNOSIS — Z452 Encounter for adjustment and management of vascular access device: Secondary | ICD-10-CM

## 2019-05-02 DIAGNOSIS — Z856 Personal history of leukemia: Secondary | ICD-10-CM | POA: Diagnosis not present

## 2019-05-02 DIAGNOSIS — E876 Hypokalemia: Secondary | ICD-10-CM | POA: Insufficient documentation

## 2019-05-02 DIAGNOSIS — A1839 Retroperitoneal tuberculosis: Secondary | ICD-10-CM | POA: Diagnosis not present

## 2019-05-02 DIAGNOSIS — Z95828 Presence of other vascular implants and grafts: Secondary | ICD-10-CM

## 2019-05-02 DIAGNOSIS — J9811 Atelectasis: Secondary | ICD-10-CM | POA: Diagnosis not present

## 2019-05-02 LAB — CBC WITH DIFFERENTIAL/PLATELET
Abs Immature Granulocytes: 0.01 10*3/uL (ref 0.00–0.07)
Basophils Absolute: 0 10*3/uL (ref 0.0–0.1)
Basophils Relative: 1 %
Eosinophils Absolute: 0.1 10*3/uL (ref 0.0–0.5)
Eosinophils Relative: 2 %
HCT: 37.5 % (ref 36.0–46.0)
Hemoglobin: 11.9 g/dL — ABNORMAL LOW (ref 12.0–15.0)
Immature Granulocytes: 0 %
Lymphocytes Relative: 57 %
Lymphs Abs: 2.2 10*3/uL (ref 0.7–4.0)
MCH: 29.6 pg (ref 26.0–34.0)
MCHC: 31.7 g/dL (ref 30.0–36.0)
MCV: 93.3 fL (ref 80.0–100.0)
Monocytes Absolute: 0.7 10*3/uL (ref 0.1–1.0)
Monocytes Relative: 17 %
Neutro Abs: 0.9 10*3/uL — ABNORMAL LOW (ref 1.7–7.7)
Neutrophils Relative %: 23 %
Platelets: 164 10*3/uL (ref 150–400)
RBC: 4.02 MIL/uL (ref 3.87–5.11)
RDW: 14.4 % (ref 11.5–15.5)
WBC: 3.8 10*3/uL — ABNORMAL LOW (ref 4.0–10.5)
nRBC: 0 % (ref 0.0–0.2)

## 2019-05-02 LAB — COMPREHENSIVE METABOLIC PANEL
ALT: 10 U/L (ref 0–44)
AST: 15 U/L (ref 15–41)
Albumin: 4.1 g/dL (ref 3.5–5.0)
Alkaline Phosphatase: 70 U/L (ref 38–126)
Anion gap: 9 (ref 5–15)
BUN: 12 mg/dL (ref 6–20)
CO2: 25 mmol/L (ref 22–32)
Calcium: 9.3 mg/dL (ref 8.9–10.3)
Chloride: 108 mmol/L (ref 98–111)
Creatinine, Ser: 0.75 mg/dL (ref 0.44–1.00)
GFR calc Af Amer: >60 mL/min (ref >60)
GFR calc non Af Amer: >60 mL/min (ref >60)
Glucose, Bld: 95 mg/dL (ref 70–99)
Potassium: 3.4 mmol/L — ABNORMAL LOW (ref 3.5–5.1)
Sodium: 142 mmol/L (ref 135–145)
Total Bilirubin: 0.9 mg/dL (ref 0.3–1.2)
Total Protein: 6.5 g/dL (ref 6.5–8.1)

## 2019-05-02 LAB — LACTATE DEHYDROGENASE: LDH: 200 U/L — ABNORMAL HIGH (ref 98–192)

## 2019-05-02 MED ORDER — SODIUM CHLORIDE 0.9% FLUSH
10.0000 mL | Freq: Once | INTRAVENOUS | Status: AC
  Administered 2019-05-02: 10 mL
  Filled 2019-05-02: qty 10

## 2019-05-02 MED ORDER — IOHEXOL 300 MG/ML  SOLN
100.0000 mL | Freq: Once | INTRAMUSCULAR | Status: AC | PRN
  Administered 2019-05-02: 12:00:00 100 mL via INTRAVENOUS

## 2019-05-02 MED ORDER — SODIUM CHLORIDE (PF) 0.9 % IJ SOLN
INTRAMUSCULAR | Status: AC
  Filled 2019-05-02: qty 50

## 2019-05-02 MED ORDER — HEPARIN SOD (PORK) LOCK FLUSH 100 UNIT/ML IV SOLN
INTRAVENOUS | Status: AC
  Filled 2019-05-02: qty 5

## 2019-05-02 MED ORDER — HEPARIN SOD (PORK) LOCK FLUSH 100 UNIT/ML IV SOLN
500.0000 [IU] | Freq: Once | INTRAVENOUS | Status: AC
  Administered 2019-05-02: 12:00:00 500 [IU] via INTRAVENOUS

## 2019-05-03 ENCOUNTER — Inpatient Hospital Stay: Payer: BC Managed Care – PPO

## 2019-05-03 ENCOUNTER — Encounter: Payer: Self-pay | Admitting: Hematology and Oncology

## 2019-05-03 ENCOUNTER — Other Ambulatory Visit: Payer: Self-pay

## 2019-05-03 ENCOUNTER — Other Ambulatory Visit: Payer: Self-pay | Admitting: Cardiology

## 2019-05-03 ENCOUNTER — Inpatient Hospital Stay: Payer: BC Managed Care – PPO | Admitting: Hematology and Oncology

## 2019-05-03 VITALS — BP 142/80 | HR 59 | Temp 98.3°F | Resp 18

## 2019-05-03 DIAGNOSIS — D702 Other drug-induced agranulocytosis: Secondary | ICD-10-CM | POA: Diagnosis not present

## 2019-05-03 DIAGNOSIS — E876 Hypokalemia: Secondary | ICD-10-CM | POA: Diagnosis not present

## 2019-05-03 DIAGNOSIS — Z5111 Encounter for antineoplastic chemotherapy: Secondary | ICD-10-CM | POA: Diagnosis not present

## 2019-05-03 DIAGNOSIS — I428 Other cardiomyopathies: Secondary | ICD-10-CM

## 2019-05-03 DIAGNOSIS — C9110 Chronic lymphocytic leukemia of B-cell type not having achieved remission: Secondary | ICD-10-CM

## 2019-05-03 DIAGNOSIS — Z79899 Other long term (current) drug therapy: Secondary | ICD-10-CM | POA: Diagnosis not present

## 2019-05-03 DIAGNOSIS — D61818 Other pancytopenia: Secondary | ICD-10-CM

## 2019-05-03 MED ORDER — DIPHENHYDRAMINE HCL 50 MG/ML IJ SOLN
50.0000 mg | Freq: Once | INTRAMUSCULAR | Status: AC
  Administered 2019-05-03: 10:00:00 50 mg via INTRAVENOUS

## 2019-05-03 MED ORDER — SODIUM CHLORIDE 0.9 % IV SOLN
Freq: Once | INTRAVENOUS | Status: AC
  Administered 2019-05-03: 10:00:00 via INTRAVENOUS
  Filled 2019-05-03: qty 250

## 2019-05-03 MED ORDER — DIPHENHYDRAMINE HCL 50 MG/ML IJ SOLN
INTRAMUSCULAR | Status: AC
  Filled 2019-05-03: qty 1

## 2019-05-03 MED ORDER — SACUBITRIL-VALSARTAN 24-26 MG PO TABS: 1.0000 | ORAL_TABLET | Freq: Two times a day (BID) | ORAL | 1 refills | Status: DC

## 2019-05-03 MED ORDER — HEPARIN SOD (PORK) LOCK FLUSH 100 UNIT/ML IV SOLN
500.0000 [IU] | Freq: Once | INTRAVENOUS | Status: AC | PRN
  Administered 2019-05-03: 15:00:00 500 [IU]
  Filled 2019-05-03: qty 5

## 2019-05-03 MED ORDER — SODIUM CHLORIDE 0.9 % IV SOLN
1000.0000 mg | Freq: Once | INTRAVENOUS | Status: AC
  Administered 2019-05-03: 11:00:00 1000 mg via INTRAVENOUS
  Filled 2019-05-03: qty 40

## 2019-05-03 MED ORDER — ACETAMINOPHEN 325 MG PO TABS
ORAL_TABLET | ORAL | Status: AC
  Filled 2019-05-03: qty 2

## 2019-05-03 MED ORDER — SODIUM CHLORIDE 0.9 % IV SOLN
20.0000 mg | Freq: Once | INTRAVENOUS | Status: AC
  Administered 2019-05-03: 20 mg via INTRAVENOUS
  Filled 2019-05-03: qty 2

## 2019-05-03 MED ORDER — ACETAMINOPHEN 325 MG PO TABS
650.0000 mg | ORAL_TABLET | Freq: Once | ORAL | Status: AC
  Administered 2019-05-03: 10:00:00 650 mg via ORAL

## 2019-05-03 MED ORDER — SODIUM CHLORIDE 0.9% FLUSH
10.0000 mL | INTRAVENOUS | Status: DC | PRN
  Administered 2019-05-03: 15:00:00 10 mL
  Filled 2019-05-03: qty 10

## 2019-05-03 NOTE — Patient Instructions (Signed)
Coronavirus (COVID-19) Are you at risk?  Are you at risk for the Coronavirus (COVID-19)?  To be considered HIGH RISK for Coronavirus (COVID-19), you have to meet the following criteria:  . Traveled to [place], [place], [place], [place] or [place]; or in the [place] to [place], [place], [place], or [place]; and have fever, cough, and shortness of breath within the last 2 weeks of travel OR . Been in close contact with a person diagnosed with COVID-19 within the last 2 weeks and have fever, cough, and shortness of breath . IF YOU DO NOT MEET THESE CRITERIA, YOU ARE CONSIDERED LOW RISK FOR COVID-19.  What to do if you are HIGH RISK for COVID-19?  . If you are having a medical emergency, call 911. . Seek medical care right away. Before you go to a doctor's office, urgent care or emergency department, call ahead and tell them about your recent travel, contact with someone diagnosed with COVID-19, and your symptoms. You should receive instructions from your physician's office regarding next steps of care.  . When you arrive at healthcare provider, tell the healthcare staff immediately you have returned from visiting [place], [place], [place], [place] or [place]; or traveled in the [place] to [place], [place], [place], or [place]; in the last two weeks or you have been in close contact with a person diagnosed with COVID-19 in the last 2 weeks.   . Tell the health care staff about your symptoms: fever, cough and shortness of breath. . After you have been seen by a medical provider, you will be either: o Tested for (COVID-19) and discharged home on quarantine except to seek medical care if symptoms worsen, and asked to  - Stay home and avoid contact with others until you get your results (4-5 days)  - Avoid travel on public transportation if possible (such as bus, train, or airplane) or o Sent to the Emergency Department by EMS for evaluation, COVID-19 testing, and possible  admission depending on your condition and test results.  What to do if you are LOW RISK for COVID-19?  Reduce your risk of any infection by using the same precautions used for avoiding the common cold or flu:  . Wash your hands often with soap and warm water for at least 20 seconds.  If soap and water are not readily available, use an alcohol-based hand sanitizer with at least 60% alcohol.  . If coughing or sneezing, cover your mouth and nose by coughing or sneezing into the elbow areas of your shirt or coat, into a tissue or into your sleeve (not your hands). . Avoid shaking hands with others and consider head nods or verbal greetings only. . Avoid touching your eyes, nose, or mouth with unwashed hands.  . Avoid close contact with people who are sick. . Avoid places or events with large numbers of people in one location, like concerts or sporting events. . Carefully consider travel plans you have or are making. . If you are planning any travel outside or inside the [place], visit the CDC's Travelers' Health webpage for the latest health notices. . If you have some symptoms but not all symptoms, continue to monitor at home and seek medical attention if your symptoms worsen. . If you are having a medical emergency, call 911.   ADDITIONAL HEALTHCARE OPTIONS FOR PATIENTS  [facility] / e-Visit: [email or web address]         [facility]: [phone]  Onset   Urgent Care: [facility]: [facility] Discharge Instructions for Patients Receiving Chemotherapy  Today you received the following chemotherapy agents Dyann Kief   To help prevent nausea and vomiting after your treatment, we encourage you to take your nausea medication as directed.    If you develop nausea and vomiting that is not controlled by your nausea medication, call the clinic.   BELOW ARE  SYMPTOMS THAT SHOULD BE REPORTED IMMEDIATELY:  *FEVER GREATER THAN 100.5 F  *CHILLS WITH OR WITHOUT FEVER  NAUSEA AND VOMITING THAT IS NOT CONTROLLED WITH YOUR NAUSEA MEDICATION  *UNUSUAL SHORTNESS OF BREATH  *UNUSUAL BRUISING OR BLEEDING  TENDERNESS IN MOUTH AND THROAT WITH OR WITHOUT PRESENCE OF ULCERS  *URINARY PROBLEMS  *BOWEL PROBLEMS  UNUSUAL RASH Items with * indicate a potential emergency and should be followed up as soon as possible.  Feel free to call the clinic should you have any questions or concerns. The clinic phone number is [phone].  Please show the [facility] at check-in to the Emergency Department and triage nurse.

## 2019-05-03 NOTE — Progress Notes (Signed)
[place] OFFICE PROGRESS NOTE  Patient Care Team: Janith Lima, MD as PCP - General (Internal Medicine) Constance Haw, MD as PCP - Electrophysiology (Cardiology) Constance Haw, MD as Consulting Physician (Cardiology)  ASSESSMENT & PLAN:  CLL (chronic lymphocytic leukemia) ([place]) I have reviewed multiple imaging studies with the patient Overall, she had positive response to therapy I am not convinced with the small, larged abdominal lymphadenopathy seen She will continue treatment for another 3 months before planning to repeat CT again at the end of October  Drug-induced neutropenia ([place]) She has mild intermittent pancytopenia due to treatment but not symptomatic Observe only for now. We will proceed with treatment without dose adjustment  Nonischemic cardiomyopathy ([place]) Her recent echocardiogram showed no evidence of changes in ejection fraction I will continue to observe closely She is doing well with no clinical signs or symptoms of congestive heart failure  Hypokalemia She has mild intermittent hypokalemia likely secondary to diuretic therapy She is not symptomatic Observe only   No orders of the defined types were placed in this encounter.   INTERVAL HISTORY: Please see below for problem oriented charting. She returns for further follow-up She tolerated recent chemotherapy well No new enlarging lymphadenopathy No recent infection, fever or chills No recent signs or symptoms of congestive heart failure  SUMMARY OF ONCOLOGIC HISTORY: Oncology History  CLL (chronic lymphocytic leukemia) ([place])  04/05/2015 Pathology Results   Accession: [id number] flow cytometry confirmed CLL. FISH was positive for p53 mutation   04/24/2015 Imaging   Extensive lymphadenopathy throughout the neck, chest (axilla), abdomen and pelvis, as detailed above, compatible with the reported clinical history of lymphoma. 2. Mild splenomegaly.   05/03/2015 -  08/27/2015 Chemotherapy   She started on Ibrutinib, discontinued prematurely when her prescription ran out   10/10/2015 - 05/29/2017 Chemotherapy   She was restarted back on Ibrutinib   11/13/2016 PET scan   Significant generalized reduction in size of numerous lymph nodes in the neck, chest, abdomen, and pelvis. Previously the activity of these nodes was low-level and in general a similar low-level activity is present today, significantly less than mediastinal blood pool activity, compatible with Deauville 2. 2. Coronary atherosclerosis. Mild cardiomegaly. 3. Mildly prominent endometrium for age without accentuated metabolic activity in the endometrium. Consider pelvic sonography for further characterization.   05/27/2017 PET scan   1. Progressive hypermetabolic adenopathy, primarily involving cervical, axillary, pelvic and inguinal lymph nodes bilaterally, consistent with progressive lymphoma. 2. No solid visceral organ or osseous involvement.   06/16/2017 - 08/25/2017 Chemotherapy   The patient had 3 cycles of Rituximab and Bendamustine   07/15/2017 - 07/19/2017 Hospital Admission   The patient was briefly admitted to the hospital due to infusion reaction to rituximab   09/18/2017 PET scan   1. Continued considerable adenopathy in the neck, chest, abdomen, and pelvis. This is generally stable in size but mildly reduced in activity compared to the prior exam. Current levels of activity primarily Deauville 3 and Deauville 4. No splenomegaly. 2. Diffuse new ground-glass opacities in the lungs with associated hypermetabolic activity. Some forms of lymphoma infiltration can rarely cause this pattern of diffuse ground-glass opacity and hypermetabolic activity. Differential diagnostic considerations might include atypical pneumonia such as mycoplasma, acute hypersensitivity pneumonitis, or acute eosinophilic pneumonia. Pulmonary hemorrhage seems less likely to cause this degree of accentuated metabolic  activity.  3. Aortic Atherosclerosis (ICD10-I70.0). Coronary atherosclerosis.   09/24/2017 -  Chemotherapy   The patient had ventoclax for chemotherapy treatment.  Rituximab is added on 11/18/17 to 04/09/18, x 6 cycles   11/19/2017 PET scan   Overall mild interval decrease in hypermetabolic lymphadenopathy throughout the neck, chest, abdomen, and pelvis. No new or increased lymphadenopathy identified.  While there has been resolution of diffuse hypermetabolic bilateral ground-glass pulmonary opacity since prior study, there is a new 14 mm hypermetabolic pulmonary nodule in the posterior right lower lobe. Time course favors inflammatory or infectious etiology over neoplasm. Recommend continued follow-up by chest CT in 3 months.   02/16/2018 PET scan   1. Adenopathy in the neck, chest, and pelvis is stable to minimally reduced in size, and is moderately reduced in activity, primarily Deauville 2 disease today. There is a right common iliac lymph node qualifying as Deauville 3 disease which is stable in size but reduced in activity. 2. Stable size but reduced activity in a pulmonary nodule in the right lower lobe. If this represents a leukemic lesion then a corresponds to Deauville 4 disease. This lesion was not readily apparent on 09/22/2017 but was reported on the prior chest CT of 11/19/2017. 3. Other imaging findings of potential clinical significance: Mild thyroid goiter. Aortic Atherosclerosis (ICD10-I70.0). Coronary atherosclerosis. Mild cardiomegaly.   04/19/2018 PET scan   1. Interval mild mixed metabolic changes, as detailed. Persistent mildly hypermetabolic bilateral axillary, mediastinal and bilateral pelvic adenopathy and mildly hypermetabolic right lower lobe pulmonary nodule compatible with lymphoproliferative disorder. Deauville 4 based on the subcarinal node. 2. Aortic Atherosclerosis (ICD10-I70.0).   06/29/2018 Procedure   Successful placement of a right internal jugular approach  power injectable Port-A-Cath. The catheter is ready for immediate use.   07/26/2018 PET scan   1. Adenopathy primarily in the chest and pelvis as noted above, with size of the mildly enlarged lymph nodes stable to minimally increased, but with nodal activity generally decreased compared to the prior exam. Uptake in these nodes is primarily Deauville 2. 2. Aortic Atherosclerosis (ICD10-I70.0). Coronary atherosclerosis with mild cardiomegaly.   10/29/2018 PET scan   1. Relatively stable sized lymphadenopathy but interval increase in hypermetabolism when compared to the most recent prior PET-CT, as detailed above. 2. No new disease is identified. 3. Stable bilateral pulmonary nodules.   11/12/2018 - 02/02/2019 Chemotherapy   The patient had riTUXimab (RITUXAN) 800 mg in sodium chloride 0.9 % 170 mL infusion, 375 mg/m2 = 800 mg, Intravenous,  Once, 3 of 4 cycles Administration: 800 mg (11/12/2018), 800 mg (12/10/2018), 800 mg (01/06/2019)  for chemotherapy treatment.    01/11/2019 Imaging   1. Significant increased adenopathy in the chest, abdomen, and pelvis compared to the 10/28/2018 PET-CT. 2. The scattered tiny pulmonary nodules and larger single right lower lobe pulmonary nodule are all stable. 3. Other imaging findings of potential clinical significance: Aortic Atherosclerosis (ICD10-I70.0). Coronary atherosclerosis. Mild cardiomegaly. Mild diffuse thyroid prominence, stable. Mild left foraminal impingement at L4-5.   01/21/2019 Procedure   Successful ultrasound-guided core biopsies of an enlarged right axillary lymph node.    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 The latter is  important because it essentially rules out the possibility of mantle cell lymphoma. CD3 highlights a small population of T-cells. By flow cytometry, a kappa-restricted monoclonal B-cell population that expresses CD19, CD20, CD5, and CD23 comprises 89% of all  lymphocytes. Overall, the features are consistent with chronic lymphocytic leukemia/small lymphocytic lymphoma.   01/31/2019 Echocardiogram    1. The left ventricle has normal systolic function, with an ejection fraction of 55-60%. The cavity size was normal. Left ventricular diastolic Doppler parameters are consistent with impaired relaxation. No evidence of left ventricular regional wall motion abnormalities. GLS -20%.  2. The right ventricle has normal systolic function. The cavity was normal. There is no increase in right ventricular wall thickness.  3. The aortic valve is tricuspid. Mild calcification of the aortic valve. No stenosis of the aortic valve.  4. The aortic root is normal in size and structure.  5. 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  6. No evidence of mitral valve stenosis. No significant mitral regurgitation.  7. Normal IVC size. No complete TR doppler jet so unable to estimate PA systolic pressure.   02/03/2019 -  Chemotherapy   The patient had obinutuzumab and Acalabrutinib for chemotherapy treatment.    05/02/2019 Imaging   1. Interval decrease in size axillary and mediastinal adenopathy. 2. There are a few retroperitoneal lymph nodes which are mildly increased in size. The majority of the retroperitoneal and pelvic lymph nodes are grossly similar when compared to prior exam. 3. Stable scattered pulmonary nodules.     REVIEW OF SYSTEMS:   Constitutional: Denies fevers, chills or abnormal weight loss Eyes: Denies blurriness of vision Ears, nose, mouth, throat, and face: Denies mucositis or sore throat Respiratory: Denies cough, dyspnea or wheezes Cardiovascular: Denies palpitation, chest  discomfort or lower extremity swelling Gastrointestinal:  Denies nausea, heartburn or change in bowel habits Skin: Denies abnormal skin rashes Lymphatics: Denies new lymphadenopathy or easy bruising Neurological:Denies numbness, tingling or new weaknesses Behavioral/Psych: Mood is stable, no new changes  All other systems were reviewed with the patient and are negative.  I have reviewed the past medical history, past surgical history, social history and family history with the patient and they are unchanged from previous note.  ALLERGIES:  has No Known Allergies.  MEDICATIONS:  Current Outpatient Medications  Medication Sig Dispense Refill  . Acalabrutinib 100 MG CAPS Take 100 mg by mouth 2 (two) times daily. 60 capsule 11  . allopurinol (ZYLOPRIM) 300 MG tablet TAKE 1 TABLET BY MOUTH EVERY DAY 90 tablet 1  . Cholecalciferol (VITAMIN D-3) 1000 units CAPS Take 1,000 Units by mouth daily with breakfast.    . ELIQUIS 5 MG TABS tablet TAKE 1 TABLET BY MOUTH TWICE A DAY 180 tablet 1  . furosemide (LASIX) 20 MG tablet TAKE 1 TABLET BY MOUTH EVERY DAY AS NEEDED 90 tablet 2  . lidocaine-prilocaine (EMLA) cream Apply 1 application topically as needed. 30 g 6  . metoprolol succinate (TOPROL-XL) 100 MG 24 hr tablet Take 1 1/2 tablets (150 mg total) daily. Take with or immediately following a meal. 135 tablet 2  . potassium chloride SA (K-DUR,KLOR-CON) 20 MEQ tablet Take 1 tablet (20 mEq total) by mouth daily. 30 tablet 11  . sacubitril-valsartan (ENTRESTO) 24-26 MG Take 1 tablet by mouth 2 (two) times daily. 180 tablet 3   No current facility-administered medications for this visit.    Facility-Administered Medications Ordered in Other Visits  Medication Dose Route Frequency Provider Last Rate Last Dose  . dexamethasone (DECADRON) 20 mg in sodium chloride 0.9 % 50 mL IVPB  20 mg Intravenous Once Daelan Gatt, MD      . heparin lock flush 100 unit/mL  500 Units Intracatheter Once PRN Alvy Bimler,  Henna Derderian, MD       . obinutuzumab (GAZYVA) 1,000 mg in sodium chloride 0.9 % 250 mL (3.4483 mg/mL) chemo infusion  1,000 mg Intravenous Once Gael Londo, MD      . sodium chloride flush (NS) 0.9 % injection 10 mL  10 mL Intracatheter PRN Alvy Bimler, Jeannia Tatro, MD        PHYSICAL EXAMINATION: ECOG PERFORMANCE STATUS: 0 - Asymptomatic  Vitals:   05/03/19 0850  BP: 133/61  Pulse: (!) 57  Resp: 18  Temp: 98.7 F (37.1 C)  SpO2: 100%   Filed Weights   05/03/19 0850  Weight: 202 lb (91.6 kg)    GENERAL:alert, no distress and comfortable SKIN: skin color, texture, turgor are normal, no rashes or significant lesions EYES: normal, Conjunctiva are pink and non-injected, sclera clear OROPHARYNX:no exudate, no erythema and lips, buccal mucosa, and tongue normal  NECK: supple, thyroid normal size, non-tender, without nodularity LYMPH:  no palpable lymphadenopathy in the cervical, axillary or inguinal LUNGS: clear to auscultation and percussion with normal breathing effort HEART: regular rate & rhythm and no murmurs and no lower extremity edema ABDOMEN:abdomen soft, non-tender and normal bowel sounds Musculoskeletal:no cyanosis of digits and no clubbing  NEURO: alert & oriented x 3 with fluent speech, no focal motor/sensory deficits  LABORATORY DATA:  I have reviewed the data as listed    Component Value Date/Time   NA 142 05/02/2019 1050   NA 140 10/09/2017 1416   K 3.4 (L) 05/02/2019 1050   K 4.4 10/09/2017 1416   CL 108 05/02/2019 1050   CL 105 01/18/2013 0912   CO2 25 05/02/2019 1050   CO2 25 10/09/2017 1416   GLUCOSE 95 05/02/2019 1050   GLUCOSE 117 10/09/2017 1416   GLUCOSE 83 01/18/2013 0912   BUN 12 05/02/2019 1050   BUN 11.1 10/09/2017 1416   CREATININE 0.75 05/02/2019 1050   CREATININE 0.72 11/01/2018 0903   CREATININE 0.8 10/09/2017 1416   CALCIUM 9.3 05/02/2019 1050   CALCIUM 9.6 10/09/2017 1416   PROT 6.5 05/02/2019 1050   PROT 6.4 10/09/2017 1416   ALBUMIN 4.1 05/02/2019 1050    ALBUMIN 3.4 (L) 10/09/2017 1416   AST 15 05/02/2019 1050   AST 17 11/01/2018 0903   AST 18 10/09/2017 1416   ALT 10 05/02/2019 1050   ALT 15 11/01/2018 0903   ALT 19 10/09/2017 1416   ALKPHOS 70 05/02/2019 1050   ALKPHOS 69 10/09/2017 1416   BILITOT 0.9 05/02/2019 1050   BILITOT 1.3 (H) 11/01/2018 0903   BILITOT 0.57 10/09/2017 1416   GFRNONAA >60 05/02/2019 1050   GFRNONAA >60 11/01/2018 0903   GFRAA >60 05/02/2019 1050   GFRAA >60 11/01/2018 0903    No results found for: SPEP, UPEP  Lab Results  Component Value Date   WBC 3.8 (L) 05/02/2019   NEUTROABS 0.9 (L) 05/02/2019   HGB 11.9 (L) 05/02/2019   HCT 37.5 05/02/2019   MCV 93.3 05/02/2019   PLT 164 05/02/2019      Chemistry      Component Value Date/Time   NA 142 05/02/2019 1050   NA 140 10/09/2017 1416   K 3.4 (L) 05/02/2019 1050   K 4.4 10/09/2017 1416   CL 108 05/02/2019 1050   CL 105 01/18/2013 0912   CO2 25 05/02/2019 1050   CO2 25 10/09/2017 1416   BUN 12 05/02/2019 1050   BUN 11.1 10/09/2017 1416   CREATININE 0.75 05/02/2019 1050   CREATININE 0.72 11/01/2018 0903  CREATININE 0.8 10/09/2017 1416      Component Value Date/Time   CALCIUM 9.3 05/02/2019 1050   CALCIUM 9.6 10/09/2017 1416   ALKPHOS 70 05/02/2019 1050   ALKPHOS 69 10/09/2017 1416   AST 15 05/02/2019 1050   AST 17 11/01/2018 0903   AST 18 10/09/2017 1416   ALT 10 05/02/2019 1050   ALT 15 11/01/2018 0903   ALT 19 10/09/2017 1416   BILITOT 0.9 05/02/2019 1050   BILITOT 1.3 (H) 11/01/2018 0903   BILITOT 0.57 10/09/2017 1416       RADIOGRAPHIC STUDIES: I have reviewed multiple imaging studies with the patient I have personally reviewed the radiological images as listed and agreed with the findings in the report. Ct Chest W Contrast  Result Date: 05/02/2019 CLINICAL DATA:  Patient with history of chronic lymphocytic leukemia. Patient on chemotherapy. EXAM: CT CHEST, ABDOMEN, AND PELVIS WITH CONTRAST TECHNIQUE: Multidetector CT  imaging of the chest, abdomen and pelvis was performed following the standard protocol during bolus administration of intravenous contrast. CONTRAST:  115m OMNIPAQUE IOHEXOL 300 MG/ML  SOLN COMPARISON:  CT CAP January 11, 2019 FINDINGS: CT CHEST FINDINGS Cardiovascular: Right anterior chest wall Port-A-Cath is present with tip terminating in the superior vena cava. Heart is mildly enlarged. Trace fluid superior pericardial recess. Aorta and main pulmonary artery are unremarkable. Coronary arterial vascular calcifications. Mediastinum/Nodes: Interval decrease in size of bilateral axillary and mediastinal adenopathy. Reference left axillary lymph measures 1.0 cm (image 12; series 2), previously 1.5 cm. Additional left axillary lymph node measures 1.0 cm (image 15; series 2), previously 1.8 cm. Right axillary lymph node measures 1.3 cm (image 11; series 2), previously 2.8 cm. Reference subcarinal lymph node measures 0.3 cm (image 26; series 2), previously 1 8 cm. Lungs/Pleura: Central airways are patent. Scattered atelectasis. No large area pulmonary consolidation. No pleural effusion or pneumothorax. Scattered small nodules are grossly stable. Unchanged 1.6 cm subpleural nodule right lower lobe (image 83; series 4). No pleural effusion Musculoskeletal: Thoracic spine degenerative changes. No aggressive or acute appearing osseous lesions. CT ABDOMEN PELVIS FINDINGS Hepatobiliary: The liver is normal in size and contour. Deposition adjacent to the falciform ligament. Gallbladder is unremarkable. No intrahepatic or extrahepatic biliary ductal dilatation. Pancreas: Unremarkable Spleen: Unremarkable. Adrenals/Urinary Tract: Normal adrenal glands. Kidneys enhance symmetrically. Stable too small to characterize low-attenuation lesion superior right kidney. Urinary bladder is. Stomach/Bowel: Oral contrast material throughout the small large. No abnormal bowel wall thickening or evidence for bowel obstruction. No fluid or free  intraperitoneal air. Of the stomach. Vascular/Lymphatic: Normal caliber abdominal calcified atherosclerotic. Slight interval increase in size of retroperitoneal adenopathy including an 11 mm left periaortic node (image 76; series 2), previously 9 mm. Left common iliac lymph node measures 1.2 cm (image 82; series 2), previously 0.8 cm. Right common iliac lymph node measures 1.1 cm (image 82; series 2), previously 0.8 cm. Left pelvic sidewall lymph measures 1.6 cm (image 13; series 2 previously 1.6 cm. Reproductive: Unremarkable. Other: Similar-appearing soft tissue nodule along the greater curvature of the stomach measuring 0.7 cm (image 61; series 2). Musculoskeletal: Lumbar spine degenerative changes. No aggressive or acute appearing osseous lesions. IMPRESSION: 1. Interval decrease in size axillary and mediastinal adenopathy. 2. There are a few retroperitoneal lymph nodes which are mildly increased in size. The majority of the retroperitoneal and pelvic lymph nodes are grossly similar when compared to prior exam. 3. Stable scattered pulmonary nodules. Electronically Signed   By: DLovey NewcomerM.D.   On: 05/02/2019 13:38  Ct Abdomen Pelvis W Contrast  Result Date: 05/02/2019 CLINICAL DATA:  Patient with history of chronic lymphocytic leukemia. Patient on chemotherapy. EXAM: CT CHEST, ABDOMEN, AND PELVIS WITH CONTRAST TECHNIQUE: Multidetector CT imaging of the chest, abdomen and pelvis was performed following the standard protocol during bolus administration of intravenous contrast. CONTRAST:  147m OMNIPAQUE IOHEXOL 300 MG/ML  SOLN COMPARISON:  CT CAP January 11, 2019 FINDINGS: CT CHEST FINDINGS Cardiovascular: Right anterior chest wall Port-A-Cath is present with tip terminating in the superior vena cava. Heart is mildly enlarged. Trace fluid superior pericardial recess. Aorta and main pulmonary artery are unremarkable. Coronary arterial vascular calcifications. Mediastinum/Nodes: Interval decrease in size of  bilateral axillary and mediastinal adenopathy. Reference left axillary lymph measures 1.0 cm (image 12; series 2), previously 1.5 cm. Additional left axillary lymph node measures 1.0 cm (image 15; series 2), previously 1.8 cm. Right axillary lymph node measures 1.3 cm (image 11; series 2), previously 2.8 cm. Reference subcarinal lymph node measures 0.3 cm (image 26; series 2), previously 1 8 cm. Lungs/Pleura: Central airways are patent. Scattered atelectasis. No large area pulmonary consolidation. No pleural effusion or pneumothorax. Scattered small nodules are grossly stable. Unchanged 1.6 cm subpleural nodule right lower lobe (image 83; series 4). No pleural effusion Musculoskeletal: Thoracic spine degenerative changes. No aggressive or acute appearing osseous lesions. CT ABDOMEN PELVIS FINDINGS Hepatobiliary: The liver is normal in size and contour. Deposition adjacent to the falciform ligament. Gallbladder is unremarkable. No intrahepatic or extrahepatic biliary ductal dilatation. Pancreas: Unremarkable Spleen: Unremarkable. Adrenals/Urinary Tract: Normal adrenal glands. Kidneys enhance symmetrically. Stable too small to characterize low-attenuation lesion superior right kidney. Urinary bladder is. Stomach/Bowel: Oral contrast material throughout the small large. No abnormal bowel wall thickening or evidence for bowel obstruction. No fluid or free intraperitoneal air. Of the stomach. Vascular/Lymphatic: Normal caliber abdominal calcified atherosclerotic. Slight interval increase in size of retroperitoneal adenopathy including an 11 mm left periaortic node (image 76; series 2), previously 9 mm. Left common iliac lymph node measures 1.2 cm (image 82; series 2), previously 0.8 cm. Right common iliac lymph node measures 1.1 cm (image 82; series 2), previously 0.8 cm. Left pelvic sidewall lymph measures 1.6 cm (image 13; series 2 previously 1.6 cm. Reproductive: Unremarkable. Other: Similar-appearing soft tissue  nodule along the greater curvature of the stomach measuring 0.7 cm (image 61; series 2). Musculoskeletal: Lumbar spine degenerative changes. No aggressive or acute appearing osseous lesions. IMPRESSION: 1. Interval decrease in size axillary and mediastinal adenopathy. 2. There are a few retroperitoneal lymph nodes which are mildly increased in size. The majority of the retroperitoneal and pelvic lymph nodes are grossly similar when compared to prior exam. 3. Stable scattered pulmonary nodules. Electronically Signed   By: DLovey NewcomerM.D.   On: 05/02/2019 13:38    All questions were answered. The patient knows to call the clinic with any problems, questions or concerns. No barriers to learning was detected.  I spent 25 minutes counseling the patient face to face. The total time spent in the appointment was 30 minutes and more than 50% was on counseling and review of test results  NHeath Lark MD 05/03/2019 9:46 AM

## 2019-05-03 NOTE — Assessment & Plan Note (Signed)
She has mild intermittent hypokalemia likely secondary to diuretic therapy She is not symptomatic Observe only

## 2019-05-03 NOTE — Assessment & Plan Note (Signed)
Her recent echocardiogram showed no evidence of changes in ejection fraction I will continue to observe closely She is doing well with no clinical signs or symptoms of congestive heart failure

## 2019-05-03 NOTE — Assessment & Plan Note (Signed)
She has mild intermittent pancytopenia due to treatment but not symptomatic Observe only for now. We will proceed with treatment without dose adjustment

## 2019-05-03 NOTE — Assessment & Plan Note (Signed)
I have reviewed multiple imaging studies with the patient Overall, she had positive response to therapy I am not convinced with the small, larged abdominal lymphadenopathy seen She will continue treatment for another 3 months before planning to repeat CT again at the end of October

## 2019-05-04 ENCOUNTER — Telehealth: Payer: Self-pay | Admitting: Hematology and Oncology

## 2019-05-04 NOTE — Telephone Encounter (Signed)
I left a message regarding schedule  

## 2019-05-26 MED FILL — CALQUENCE 100 MG CAPSULE: 100 | 30 days supply | Qty: 60 | Fill #4

## 2019-05-30 ENCOUNTER — Other Ambulatory Visit: Payer: Self-pay | Admitting: Hematology and Oncology

## 2019-05-30 DIAGNOSIS — C9110 Chronic lymphocytic leukemia of B-cell type not having achieved remission: Secondary | ICD-10-CM

## 2019-05-31 ENCOUNTER — Other Ambulatory Visit: Payer: Self-pay

## 2019-05-31 ENCOUNTER — Inpatient Hospital Stay: Payer: BC Managed Care – PPO | Attending: Hematology and Oncology

## 2019-05-31 ENCOUNTER — Encounter: Payer: Self-pay | Admitting: Hematology and Oncology

## 2019-05-31 ENCOUNTER — Inpatient Hospital Stay: Payer: BC Managed Care – PPO

## 2019-05-31 ENCOUNTER — Inpatient Hospital Stay (HOSPITAL_BASED_OUTPATIENT_CLINIC_OR_DEPARTMENT_OTHER): Payer: BC Managed Care – PPO | Admitting: Hematology and Oncology

## 2019-05-31 VITALS — BP 134/71 | HR 59 | Temp 98.2°F | Resp 22

## 2019-05-31 DIAGNOSIS — Z95828 Presence of other vascular implants and grafts: Secondary | ICD-10-CM

## 2019-05-31 DIAGNOSIS — C9110 Chronic lymphocytic leukemia of B-cell type not having achieved remission: Secondary | ICD-10-CM

## 2019-05-31 DIAGNOSIS — I480 Paroxysmal atrial fibrillation: Secondary | ICD-10-CM | POA: Insufficient documentation

## 2019-05-31 DIAGNOSIS — D61818 Other pancytopenia: Secondary | ICD-10-CM

## 2019-05-31 DIAGNOSIS — Z7901 Long term (current) use of anticoagulants: Secondary | ICD-10-CM | POA: Insufficient documentation

## 2019-05-31 DIAGNOSIS — Z79899 Other long term (current) drug therapy: Secondary | ICD-10-CM | POA: Diagnosis not present

## 2019-05-31 DIAGNOSIS — Z5111 Encounter for antineoplastic chemotherapy: Secondary | ICD-10-CM | POA: Insufficient documentation

## 2019-05-31 DIAGNOSIS — Z452 Encounter for adjustment and management of vascular access device: Secondary | ICD-10-CM

## 2019-05-31 LAB — COMPREHENSIVE METABOLIC PANEL
ALT: 10 U/L (ref 0–44)
AST: 13 U/L — ABNORMAL LOW (ref 15–41)
Albumin: 3.9 g/dL (ref 3.5–5.0)
Alkaline Phosphatase: 60 U/L (ref 38–126)
Anion gap: 10 (ref 5–15)
BUN: 10 mg/dL (ref 6–20)
CO2: 22 mmol/L (ref 22–32)
Calcium: 8.8 mg/dL — ABNORMAL LOW (ref 8.9–10.3)
Chloride: 112 mmol/L — ABNORMAL HIGH (ref 98–111)
Creatinine, Ser: 0.75 mg/dL (ref 0.44–1.00)
GFR calc Af Amer: >60 mL/min (ref >60)
GFR calc non Af Amer: >60 mL/min (ref >60)
Glucose, Bld: 99 mg/dL (ref 70–99)
Potassium: 3.6 mmol/L (ref 3.5–5.1)
Sodium: 144 mmol/L (ref 135–145)
Total Bilirubin: 0.8 mg/dL (ref 0.3–1.2)
Total Protein: 6.2 g/dL — ABNORMAL LOW (ref 6.5–8.1)

## 2019-05-31 LAB — CBC WITH DIFFERENTIAL/PLATELET
Abs Immature Granulocytes: 0.01 10*3/uL (ref 0.00–0.07)
Basophils Absolute: 0 10*3/uL (ref 0.0–0.1)
Basophils Relative: 1 %
Eosinophils Absolute: 0.1 10*3/uL (ref 0.0–0.5)
Eosinophils Relative: 3 %
HCT: 35.8 % — ABNORMAL LOW (ref 36.0–46.0)
Hemoglobin: 11.5 g/dL — ABNORMAL LOW (ref 12.0–15.0)
Immature Granulocytes: 0 %
Lymphocytes Relative: 40 %
Lymphs Abs: 1.4 10*3/uL (ref 0.7–4.0)
MCH: 30.3 pg (ref 26.0–34.0)
MCHC: 32.1 g/dL (ref 30.0–36.0)
MCV: 94.5 fL (ref 80.0–100.0)
Monocytes Absolute: 0.4 10*3/uL (ref 0.1–1.0)
Monocytes Relative: 13 %
Neutro Abs: 1.5 10*3/uL — ABNORMAL LOW (ref 1.7–7.7)
Neutrophils Relative %: 43 %
Platelets: 132 10*3/uL — ABNORMAL LOW (ref 150–400)
RBC: 3.79 MIL/uL — ABNORMAL LOW (ref 3.87–5.11)
RDW: 13.2 % (ref 11.5–15.5)
WBC: 3.5 10*3/uL — ABNORMAL LOW (ref 4.0–10.5)
nRBC: 0 % (ref 0.0–0.2)

## 2019-05-31 MED ORDER — ACETAMINOPHEN 325 MG PO TABS
650.0000 mg | ORAL_TABLET | Freq: Once | ORAL | Status: AC
  Administered 2019-05-31: 650 mg via ORAL

## 2019-05-31 MED ORDER — SODIUM CHLORIDE 0.9 % IV SOLN
20.0000 mg | Freq: Once | INTRAVENOUS | Status: AC
  Administered 2019-05-31: 10:00:00 20 mg via INTRAVENOUS
  Filled 2019-05-31: qty 20

## 2019-05-31 MED ORDER — SODIUM CHLORIDE 0.9% FLUSH
10.0000 mL | Freq: Once | INTRAVENOUS | Status: AC
  Administered 2019-05-31: 10 mL
  Filled 2019-05-31: qty 10

## 2019-05-31 MED ORDER — DIPHENHYDRAMINE HCL 50 MG/ML IJ SOLN
50.0000 mg | Freq: Once | INTRAMUSCULAR | Status: AC
  Administered 2019-05-31: 10:00:00 50 mg via INTRAVENOUS

## 2019-05-31 MED ORDER — SODIUM CHLORIDE 0.9% FLUSH
10.0000 mL | INTRAVENOUS | Status: DC | PRN
  Administered 2019-05-31: 10 mL
  Filled 2019-05-31: qty 10

## 2019-05-31 MED ORDER — SODIUM CHLORIDE 0.9 % IV SOLN
1000.0000 mg | Freq: Once | INTRAVENOUS | Status: AC
  Administered 2019-05-31: 1000 mg via INTRAVENOUS
  Filled 2019-05-31: qty 40

## 2019-05-31 MED ORDER — HEPARIN SOD (PORK) LOCK FLUSH 100 UNIT/ML IV SOLN
500.0000 [IU] | Freq: Once | INTRAVENOUS | Status: AC | PRN
  Administered 2019-05-31: 15:00:00 500 [IU]
  Filled 2019-05-31: qty 5

## 2019-05-31 MED ORDER — SODIUM CHLORIDE 0.9 % IV SOLN
Freq: Once | INTRAVENOUS | Status: AC
  Administered 2019-05-31: 10:00:00 via INTRAVENOUS
  Filled 2019-05-31: qty 250

## 2019-05-31 MED ORDER — DIPHENHYDRAMINE HCL 50 MG/ML IJ SOLN
INTRAMUSCULAR | Status: AC
  Filled 2019-05-31: qty 1

## 2019-05-31 MED ORDER — ACETAMINOPHEN 325 MG PO TABS
ORAL_TABLET | ORAL | Status: AC
  Filled 2019-05-31: qty 2

## 2019-05-31 NOTE — Patient Instructions (Signed)
[facility] Discharge Instructions for Patients Receiving Chemotherapy  Today you received the following chemotherapy agents Gazyva  To help prevent nausea and vomiting after your treatment, we encourage you to take your nausea medication as prescribed.  If you develop nausea and vomiting that is not controlled by your nausea medication, call the clinic.   BELOW ARE SYMPTOMS THAT SHOULD BE REPORTED IMMEDIATELY:  *FEVER GREATER THAN 100.5 F  *CHILLS WITH OR WITHOUT FEVER  NAUSEA AND VOMITING THAT IS NOT CONTROLLED WITH YOUR NAUSEA MEDICATION  *UNUSUAL SHORTNESS OF BREATH  *UNUSUAL BRUISING OR BLEEDING  TENDERNESS IN MOUTH AND THROAT WITH OR WITHOUT PRESENCE OF ULCERS  *URINARY PROBLEMS  *BOWEL PROBLEMS  UNUSUAL RASH Items with * indicate a potential emergency and should be followed up as soon as possible.  Feel free to call the clinic should you have any questions or concerns. The clinic phone number is [phone].  Please show the CHEMO ALERT CARD at check-in to the Emergency Department and triage nurse.   

## 2019-06-02 NOTE — Assessment & Plan Note (Signed)
She has mild intermittent pancytopenia due to treatment but not symptomatic Observe only for now. 

## 2019-06-02 NOTE — Assessment & Plan Note (Addendum)
Her last CT imaging is stable She tolerated treatment well without side effects She will continue treatment for another 3 months before planning to repeat CT again at the end of October

## 2019-06-02 NOTE — Assessment & Plan Note (Signed)
Her recent echocardiogram showed no evidence of changes in ejection fraction I will continue to observe closely She is doing well with no clinical signs or symptoms of congestive heart failure 

## 2019-06-02 NOTE — Progress Notes (Signed)
[place] OFFICE PROGRESS NOTE  Patient Care Team: Janith Lima, MD as PCP - General (Internal Medicine) Constance Haw, MD as PCP - Electrophysiology (Cardiology) Constance Haw, MD as Consulting Physician (Cardiology)  ASSESSMENT & PLAN:  CLL (chronic lymphocytic leukemia) [facility]) Her last CT imaging is stable She tolerated treatment well without side effects She will continue treatment for another 3 months before planning to repeat CT again at the end of October  Pancytopenia, acquired [facility]) She has mild intermittent pancytopenia due to treatment but not symptomatic Observe only for now.  PAF (paroxysmal atrial fibrillation) (HCC) Her recent echocardiogram showed no evidence of changes in ejection fraction I will continue to observe closely She is doing well with no clinical signs or symptoms of congestive heart failure   No orders of the defined types were placed in this encounter.   INTERVAL HISTORY: Please see below for problem oriented charting. She returns for further follow-up She feels well No recent infusion reaction Denies new lymphadenopathy No recent signs or symptoms of congestive heart failure  SUMMARY OF ONCOLOGIC HISTORY: Oncology History  CLL (chronic lymphocytic leukemia) ([place])  04/05/2015 Pathology Results   Accession: [id number] flow cytometry confirmed CLL. FISH was positive for p53 mutation   04/24/2015 Imaging   Extensive lymphadenopathy throughout the neck, chest (axilla), abdomen and pelvis, as detailed above, compatible with the reported clinical history of lymphoma. 2. Mild splenomegaly.   05/03/2015 - 08/27/2015 Chemotherapy   She started on Ibrutinib, discontinued prematurely when her prescription ran out   10/10/2015 - 05/29/2017 Chemotherapy   She was restarted back on Ibrutinib   11/13/2016 PET scan   Significant generalized reduction in size of numerous lymph nodes in the neck, chest, abdomen, and pelvis.  Previously the activity of these nodes was low-level and in general a similar low-level activity is present today, significantly less than mediastinal blood pool activity, compatible with Deauville 2. 2. Coronary atherosclerosis. Mild cardiomegaly. 3. Mildly prominent endometrium for age without accentuated metabolic activity in the endometrium. Consider pelvic sonography for further characterization.   05/27/2017 PET scan   1. Progressive hypermetabolic adenopathy, primarily involving cervical, axillary, pelvic and inguinal lymph nodes bilaterally, consistent with progressive lymphoma. 2. No solid visceral organ or osseous involvement.   06/16/2017 - 08/25/2017 Chemotherapy   The patient had 3 cycles of Rituximab and Bendamustine   07/15/2017 - 07/19/2017 Hospital Admission   The patient was briefly admitted to the hospital due to infusion reaction to rituximab   09/18/2017 PET scan   1. Continued considerable adenopathy in the neck, chest, abdomen, and pelvis. This is generally stable in size but mildly reduced in activity compared to the prior exam. Current levels of activity primarily Deauville 3 and Deauville 4. No splenomegaly. 2. Diffuse new ground-glass opacities in the lungs with associated hypermetabolic activity. Some forms of lymphoma infiltration can rarely cause this pattern of diffuse ground-glass opacity and hypermetabolic activity. Differential diagnostic considerations might include atypical pneumonia such as mycoplasma, acute hypersensitivity pneumonitis, or acute eosinophilic pneumonia. Pulmonary hemorrhage seems less likely to cause this degree of accentuated metabolic activity.  3. Aortic Atherosclerosis (ICD10-I70.0). Coronary atherosclerosis.   09/24/2017 -  Chemotherapy   The patient had ventoclax for chemotherapy treatment.  Rituximab is added on 11/18/17 to 04/09/18, x 6 cycles   11/19/2017 PET scan   Overall mild interval decrease in hypermetabolic lymphadenopathy  throughout the neck, chest, abdomen, and pelvis. No new or increased lymphadenopathy identified.  While there has been resolution  of diffuse hypermetabolic bilateral ground-glass pulmonary opacity since prior study, there is a new 14 mm hypermetabolic pulmonary nodule in the posterior right lower lobe. Time course favors inflammatory or infectious etiology over neoplasm. Recommend continued follow-up by chest CT in 3 months.   02/16/2018 PET scan   1. Adenopathy in the neck, chest, and pelvis is stable to minimally reduced in size, and is moderately reduced in activity, primarily Deauville 2 disease today. There is a right common iliac lymph node qualifying as Deauville 3 disease which is stable in size but reduced in activity. 2. Stable size but reduced activity in a pulmonary nodule in the right lower lobe. If this represents a leukemic lesion then a corresponds to Deauville 4 disease. This lesion was not readily apparent on 09/22/2017 but was reported on the prior chest CT of 11/19/2017. 3. Other imaging findings of potential clinical significance: Mild thyroid goiter. Aortic Atherosclerosis (ICD10-I70.0). Coronary atherosclerosis. Mild cardiomegaly.   04/19/2018 PET scan   1. Interval mild mixed metabolic changes, as detailed. Persistent mildly hypermetabolic bilateral axillary, mediastinal and bilateral pelvic adenopathy and mildly hypermetabolic right lower lobe pulmonary nodule compatible with lymphoproliferative disorder. Deauville 4 based on the subcarinal node. 2. Aortic Atherosclerosis (ICD10-I70.0).   06/29/2018 Procedure   Successful placement of a right internal jugular approach power injectable Port-A-Cath. The catheter is ready for immediate use.   07/26/2018 PET scan   1. Adenopathy primarily in the chest and pelvis as noted above, with size of the mildly enlarged lymph nodes stable to minimally increased, but with nodal activity generally decreased compared to the prior exam. Uptake  in these nodes is primarily Deauville 2. 2. Aortic Atherosclerosis (ICD10-I70.0). Coronary atherosclerosis with mild cardiomegaly.   10/29/2018 PET scan   1. Relatively stable sized lymphadenopathy but interval increase in hypermetabolism when compared to the most recent prior PET-CT, as detailed above. 2. No new disease is identified. 3. Stable bilateral pulmonary nodules.   11/12/2018 - 02/02/2019 Chemotherapy   The patient had riTUXimab (RITUXAN) 800 mg in sodium chloride 0.9 % 170 mL infusion, 375 mg/m2 = 800 mg, Intravenous,  Once, 3 of 4 cycles Administration: 800 mg (11/12/2018), 800 mg (12/10/2018), 800 mg (01/06/2019)  for chemotherapy treatment.    01/11/2019 Imaging   1. Significant increased adenopathy in the chest, abdomen, and pelvis compared to the 10/28/2018 PET-CT. 2. The scattered tiny pulmonary nodules and larger single right lower lobe pulmonary nodule are all stable. 3. Other imaging findings of potential clinical significance: Aortic Atherosclerosis (ICD10-I70.0). Coronary atherosclerosis. Mild cardiomegaly. Mild diffuse thyroid prominence, stable. Mild left foraminal impingement at L4-5.   01/21/2019 Procedure   Successful ultrasound-guided core biopsies of an enlarged right axillary lymph node.    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 The latter is important because it essentially rules out the possibility of mantle cell lymphoma. CD3 highlights a small population of T-cells. By flow cytometry, a kappa-restricted monoclonal B-cell population that expresses CD19, CD20, CD5, and CD23 comprises 89% of all lymphocytes. Overall, the features are consistent with  chronic lymphocytic leukemia/small lymphocytic lymphoma.   01/31/2019 Echocardiogram    1. The left ventricle has normal systolic function, with an ejection fraction of 55-60%. The cavity size was normal. Left ventricular diastolic Doppler parameters  are consistent with impaired relaxation. No evidence of left ventricular regional wall motion abnormalities. GLS -20%.  2. The right ventricle has normal systolic function. The cavity was normal. There is no increase in right ventricular wall thickness.  3. The aortic valve is tricuspid. Mild calcification of the aortic valve. No stenosis of the aortic valve.  4. The aortic root is normal in size and structure.  5. 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  6. No evidence of mitral valve stenosis. No significant mitral regurgitation.  7. Normal IVC size. No complete TR doppler jet so unable to estimate PA systolic pressure.   02/03/2019 -  Chemotherapy   The patient had obinutuzumab and Acalabrutinib for chemotherapy treatment.    05/02/2019 Imaging   1. Interval decrease in size axillary and mediastinal adenopathy. 2. There are a few retroperitoneal lymph nodes which are mildly increased in size. The majority of the retroperitoneal and pelvic lymph nodes are grossly similar when compared to prior exam. 3. Stable scattered pulmonary nodules.     REVIEW OF SYSTEMS:   Constitutional: Denies fevers, chills or abnormal weight loss Eyes: Denies blurriness of vision Ears, nose, mouth, throat, and face: Denies mucositis or sore throat Respiratory: Denies cough, dyspnea or wheezes Cardiovascular: Denies palpitation, chest discomfort or lower extremity swelling Gastrointestinal:  Denies nausea, heartburn or change in bowel habits Skin: Denies abnormal skin rashes Lymphatics: Denies new lymphadenopathy or easy bruising Neurological:Denies numbness, tingling or new weaknesses Behavioral/Psych: Mood is stable, no new changes  All other  systems were reviewed with the patient and are negative.  I have reviewed the past medical history, past surgical history, social history and family history with the patient and they are unchanged from previous note.  ALLERGIES:  has No Known Allergies.  MEDICATIONS:  Current Outpatient Medications  Medication Sig Dispense Refill  . Acalabrutinib 100 MG CAPS Take 100 mg by mouth 2 (two) times daily. 60 capsule 11  . allopurinol (ZYLOPRIM) 300 MG tablet TAKE 1 TABLET BY MOUTH EVERY DAY 90 tablet 1  . Cholecalciferol (VITAMIN D-3) 1000 units CAPS Take 1,000 Units by mouth daily with breakfast.    . ELIQUIS 5 MG TABS tablet TAKE 1 TABLET BY MOUTH TWICE A DAY 180 tablet 1  . furosemide (LASIX) 20 MG tablet TAKE 1 TABLET BY MOUTH EVERY DAY AS NEEDED 90 tablet 2  . lidocaine-prilocaine (EMLA) cream Apply 1 application topically as needed. 30 g 6  . metoprolol succinate (TOPROL-XL) 100 MG 24 hr tablet Take 1 1/2 tablets (150 mg total) daily. Take with or immediately following a meal. 135 tablet 2  . potassium chloride SA (K-DUR,KLOR-CON) 20 MEQ tablet Take 1 tablet (20 mEq total) by mouth daily. 30 tablet 11  . sacubitril-valsartan (ENTRESTO) 24-26 MG Take 1 tablet by mouth 2 (two) times daily. 180 tablet 1   No current facility-administered medications for this visit.     PHYSICAL EXAMINATION: ECOG PERFORMANCE STATUS: 0 - Asymptomatic  Vitals:   05/31/19 0851  BP: (!) 150/79  Pulse: (!) 57  Resp: 18  Temp: 97.7 F (36.5 C)  SpO2: 100%   Filed Weights   05/31/19 0851  Weight: 203 lb (92.1 kg)    GENERAL:alert, no distress and comfortable SKIN: skin color, texture, turgor are normal, no rashes or significant lesions EYES: normal, Conjunctiva are pink and non-injected, sclera clear OROPHARYNX:no exudate, no erythema and lips, buccal mucosa, and tongue normal  NECK: supple, thyroid normal size, non-tender, without nodularity LYMPH:  no palpable lymphadenopathy in the cervical,  axillary or inguinal LUNGS: clear to auscultation and percussion with normal breathing effort HEART: regular rate & rhythm and no murmurs and no lower extremity edema ABDOMEN:abdomen soft, non-tender and normal bowel sounds Musculoskeletal:no cyanosis of digits and no clubbing  NEURO: alert & oriented x 3 with fluent speech, no focal motor/sensory deficits  LABORATORY DATA:  I have reviewed the data as listed    Component Value Date/Time   NA 144 05/31/2019 0840   NA 140 10/09/2017 1416   K 3.6 05/31/2019 0840   K 4.4 10/09/2017 1416   CL 112 (H) 05/31/2019 0840   CL 105 01/18/2013 0912   CO2 22 05/31/2019 0840   CO2 25 10/09/2017 1416   GLUCOSE 99 05/31/2019 0840   GLUCOSE 117 10/09/2017 1416   GLUCOSE 83 01/18/2013 0912   BUN 10 05/31/2019 0840   BUN 11.1 10/09/2017 1416   CREATININE 0.75 05/31/2019 0840   CREATININE 0.72 11/01/2018 0903   CREATININE 0.8 10/09/2017 1416   CALCIUM 8.8 (L) 05/31/2019 0840   CALCIUM 9.6 10/09/2017 1416   PROT 6.2 (L) 05/31/2019 0840   PROT 6.4 10/09/2017 1416   ALBUMIN 3.9 05/31/2019 0840   ALBUMIN 3.4 (L) 10/09/2017 1416   AST 13 (L) 05/31/2019 0840   AST 17 11/01/2018 0903   AST 18 10/09/2017 1416   ALT 10 05/31/2019 0840   ALT 15 11/01/2018 0903   ALT 19 10/09/2017 1416   ALKPHOS 60 05/31/2019 0840   ALKPHOS 69 10/09/2017 1416   BILITOT 0.8 05/31/2019 0840   BILITOT 1.3 (H) 11/01/2018 0903   BILITOT 0.57 10/09/2017 1416   GFRNONAA >60 05/31/2019 0840   GFRNONAA >60 11/01/2018 0903   GFRAA >60 05/31/2019 0840   GFRAA >60 11/01/2018 0903    No results found for: SPEP, UPEP  Lab Results  Component Value Date   WBC 3.5 (L) 05/31/2019   NEUTROABS 1.5 (L) 05/31/2019   HGB 11.5 (L) 05/31/2019   HCT 35.8 (L) 05/31/2019   MCV 94.5 05/31/2019   PLT 132 (L) 05/31/2019      Chemistry      Component Value Date/Time   NA 144 05/31/2019 0840   NA 140 10/09/2017 1416   K 3.6 05/31/2019 0840   K 4.4 10/09/2017 1416   CL 112 (H)  05/31/2019 0840   CL 105 01/18/2013 0912   CO2 22 05/31/2019 0840   CO2 25 10/09/2017 1416   BUN 10 05/31/2019 0840   BUN 11.1 10/09/2017 1416   CREATININE 0.75 05/31/2019 0840   CREATININE 0.72 11/01/2018 0903   CREATININE 0.8 10/09/2017 1416      Component Value Date/Time   CALCIUM 8.8 (L) 05/31/2019 0840   CALCIUM 9.6 10/09/2017 1416   ALKPHOS 60 05/31/2019 0840   ALKPHOS 69 10/09/2017 1416   AST 13 (L) 05/31/2019 0840   AST 17 11/01/2018 0903   AST 18 10/09/2017 1416   ALT 10 05/31/2019 0840   ALT 15 11/01/2018 0903   ALT 19 10/09/2017 1416   BILITOT 0.8 05/31/2019 0840   BILITOT 1.3 (H) 11/01/2018 0903   BILITOT 0.57 10/09/2017 1416       All questions were answered. The patient knows to call the clinic with any problems, questions or concerns. No barriers to learning was detected.  I spent 15 minutes counseling the patient face to face. The total time spent in the appointment was 20 minutes and more than 50% was on counseling and review of  test results  Heath Lark, MD 06/02/2019 11:00 AM

## 2019-06-27 ENCOUNTER — Inpatient Hospital Stay (HOSPITAL_BASED_OUTPATIENT_CLINIC_OR_DEPARTMENT_OTHER): Payer: BC Managed Care – PPO | Admitting: Hematology and Oncology

## 2019-06-27 ENCOUNTER — Inpatient Hospital Stay: Payer: BC Managed Care – PPO

## 2019-06-27 ENCOUNTER — Inpatient Hospital Stay: Payer: BC Managed Care – PPO | Attending: Hematology and Oncology

## 2019-06-27 ENCOUNTER — Other Ambulatory Visit: Payer: Self-pay

## 2019-06-27 ENCOUNTER — Encounter: Payer: Self-pay | Admitting: Hematology and Oncology

## 2019-06-27 ENCOUNTER — Telehealth: Payer: Self-pay | Admitting: Hematology and Oncology

## 2019-06-27 VITALS — BP 120/67 | HR 56 | Temp 98.2°F | Resp 16

## 2019-06-27 VITALS — BP 124/77 | HR 57 | Temp 98.2°F | Resp 18 | Ht 67.0 in | Wt 200.5 lb

## 2019-06-27 DIAGNOSIS — I10 Essential (primary) hypertension: Secondary | ICD-10-CM | POA: Insufficient documentation

## 2019-06-27 DIAGNOSIS — C9110 Chronic lymphocytic leukemia of B-cell type not having achieved remission: Secondary | ICD-10-CM

## 2019-06-27 DIAGNOSIS — Z23 Encounter for immunization: Secondary | ICD-10-CM

## 2019-06-27 DIAGNOSIS — D61818 Other pancytopenia: Secondary | ICD-10-CM | POA: Diagnosis not present

## 2019-06-27 DIAGNOSIS — Z452 Encounter for adjustment and management of vascular access device: Secondary | ICD-10-CM

## 2019-06-27 DIAGNOSIS — Z79899 Other long term (current) drug therapy: Secondary | ICD-10-CM | POA: Diagnosis not present

## 2019-06-27 DIAGNOSIS — Z5111 Encounter for antineoplastic chemotherapy: Secondary | ICD-10-CM | POA: Insufficient documentation

## 2019-06-27 DIAGNOSIS — Z95828 Presence of other vascular implants and grafts: Secondary | ICD-10-CM

## 2019-06-27 LAB — COMPREHENSIVE METABOLIC PANEL
ALT: 8 U/L (ref 0–44)
AST: 14 U/L — ABNORMAL LOW (ref 15–41)
Albumin: 3.9 g/dL (ref 3.5–5.0)
Alkaline Phosphatase: 65 U/L (ref 38–126)
Anion gap: 10 (ref 5–15)
BUN: 12 mg/dL (ref 6–20)
CO2: 23 mmol/L (ref 22–32)
Calcium: 9 mg/dL (ref 8.9–10.3)
Chloride: 109 mmol/L (ref 98–111)
Creatinine, Ser: 0.76 mg/dL (ref 0.44–1.00)
GFR calc Af Amer: >60 mL/min (ref >60)
GFR calc non Af Amer: >60 mL/min (ref >60)
Glucose, Bld: 115 mg/dL — ABNORMAL HIGH (ref 70–99)
Potassium: 3.6 mmol/L (ref 3.5–5.1)
Sodium: 142 mmol/L (ref 135–145)
Total Bilirubin: 0.9 mg/dL (ref 0.3–1.2)
Total Protein: 5.9 g/dL — ABNORMAL LOW (ref 6.5–8.1)

## 2019-06-27 LAB — CBC WITH DIFFERENTIAL/PLATELET
Abs Immature Granulocytes: 0.02 10*3/uL (ref 0.00–0.07)
Basophils Absolute: 0 10*3/uL (ref 0.0–0.1)
Basophils Relative: 1 %
Eosinophils Absolute: 0.2 10*3/uL (ref 0.0–0.5)
Eosinophils Relative: 5 %
HCT: 34.6 % — ABNORMAL LOW (ref 36.0–46.0)
Hemoglobin: 11.2 g/dL — ABNORMAL LOW (ref 12.0–15.0)
Immature Granulocytes: 1 %
Lymphocytes Relative: 39 %
Lymphs Abs: 1.4 10*3/uL (ref 0.7–4.0)
MCH: 30.3 pg (ref 26.0–34.0)
MCHC: 32.4 g/dL (ref 30.0–36.0)
MCV: 93.5 fL (ref 80.0–100.0)
Monocytes Absolute: 0.5 10*3/uL (ref 0.1–1.0)
Monocytes Relative: 14 %
Neutro Abs: 1.4 10*3/uL — ABNORMAL LOW (ref 1.7–7.7)
Neutrophils Relative %: 40 %
Platelets: 119 10*3/uL — ABNORMAL LOW (ref 150–400)
RBC: 3.7 MIL/uL — ABNORMAL LOW (ref 3.87–5.11)
RDW: 13.2 % (ref 11.5–15.5)
WBC: 3.5 10*3/uL — ABNORMAL LOW (ref 4.0–10.5)
nRBC: 0 % (ref 0.0–0.2)

## 2019-06-27 MED ORDER — SODIUM CHLORIDE 0.9 % IV SOLN
Freq: Once | INTRAVENOUS | Status: AC
  Administered 2019-06-27: 09:00:00 via INTRAVENOUS
  Filled 2019-06-27: qty 250

## 2019-06-27 MED ORDER — DIPHENHYDRAMINE HCL 50 MG/ML IJ SOLN
INTRAMUSCULAR | Status: AC
  Filled 2019-06-27: qty 1

## 2019-06-27 MED ORDER — DIPHENHYDRAMINE HCL 50 MG/ML IJ SOLN
50.0000 mg | Freq: Once | INTRAMUSCULAR | Status: AC
  Administered 2019-06-27: 50 mg via INTRAVENOUS

## 2019-06-27 MED ORDER — SODIUM CHLORIDE 0.9% FLUSH
10.0000 mL | INTRAVENOUS | Status: DC | PRN
  Administered 2019-06-27: 10 mL
  Filled 2019-06-27: qty 10

## 2019-06-27 MED ORDER — HEPARIN SOD (PORK) LOCK FLUSH 100 UNIT/ML IV SOLN
500.0000 [IU] | Freq: Once | INTRAVENOUS | Status: AC | PRN
  Administered 2019-06-27: 500 [IU]
  Filled 2019-06-27: qty 5

## 2019-06-27 MED ORDER — ACETAMINOPHEN 325 MG PO TABS
650.0000 mg | ORAL_TABLET | Freq: Once | ORAL | Status: AC
  Administered 2019-06-27: 650 mg via ORAL

## 2019-06-27 MED ORDER — SODIUM CHLORIDE 0.9 % IV SOLN
20.0000 mg | Freq: Once | INTRAVENOUS | Status: AC
  Administered 2019-06-27: 20 mg via INTRAVENOUS
  Filled 2019-06-27: qty 20

## 2019-06-27 MED ORDER — SODIUM CHLORIDE 0.9% FLUSH
10.0000 mL | Freq: Once | INTRAVENOUS | Status: AC
  Administered 2019-06-27: 10 mL
  Filled 2019-06-27: qty 10

## 2019-06-27 MED ORDER — INFLUENZA VAC SPLIT QUAD 0.5 ML IM SUSY
0.5000 mL | PREFILLED_SYRINGE | Freq: Once | INTRAMUSCULAR | Status: AC
  Administered 2019-06-27: 0.5 mL via INTRAMUSCULAR

## 2019-06-27 MED ORDER — INFLUENZA VAC SPLIT QUAD 0.5 ML IM SUSY
PREFILLED_SYRINGE | INTRAMUSCULAR | Status: AC
  Filled 2019-06-27: qty 0.5

## 2019-06-27 MED ORDER — ACETAMINOPHEN 325 MG PO TABS
ORAL_TABLET | ORAL | Status: AC
  Filled 2019-06-27: qty 2

## 2019-06-27 MED ORDER — SODIUM CHLORIDE 0.9 % IV SOLN
1000.0000 mg | Freq: Once | INTRAVENOUS | Status: AC
  Administered 2019-06-27: 1000 mg via INTRAVENOUS
  Filled 2019-06-27: qty 40

## 2019-06-27 MED FILL — CALQUENCE 100 MG CAPSULE: 100 | 30 days supply | Qty: 60 | Fill #5

## 2019-06-27 NOTE — Patient Instructions (Signed)
Coronavirus (COVID-19) Are you at risk?  Are you at risk for the Coronavirus (COVID-19)?  To be considered HIGH RISK for Coronavirus (COVID-19), you have to meet the following criteria:  . Traveled to [place], [place], [place], [place] or [place]; or in the [place] to [place], [place], [place], or [place]; and have fever, cough, and shortness of breath within the last 2 weeks of travel OR . Been in close contact with a person diagnosed with COVID-19 within the last 2 weeks and have fever, cough, and shortness of breath . IF YOU DO NOT MEET THESE CRITERIA, YOU ARE CONSIDERED LOW RISK FOR COVID-19.  What to do if you are HIGH RISK for COVID-19?  . If you are having a medical emergency, call 911. . Seek medical care right away. Before you go to a doctor's office, urgent care or emergency department, call ahead and tell them about your recent travel, contact with someone diagnosed with COVID-19, and your symptoms. You should receive instructions from your physician's office regarding next steps of care.  . When you arrive at healthcare provider, tell the healthcare staff immediately you have returned from visiting [place], [place], [place], [place] or [place]; or traveled in the [place] to [place], [place], [place], or [place]; in the last two weeks or you have been in close contact with a person diagnosed with COVID-19 in the last 2 weeks.   . Tell the health care staff about your symptoms: fever, cough and shortness of breath. . After you have been seen by a medical provider, you will be either: o Tested for (COVID-19) and discharged home on quarantine except to seek medical care if symptoms worsen, and asked to  - Stay home and avoid contact with others until you get your results (4-5 days)  - Avoid travel on public transportation if possible (such as bus, train, or airplane) or o Sent to the Emergency Department by EMS for evaluation, COVID-19 testing, and possible  admission depending on your condition and test results.  What to do if you are LOW RISK for COVID-19?  Reduce your risk of any infection by using the same precautions used for avoiding the common cold or flu:  . Wash your hands often with soap and warm water for at least 20 seconds.  If soap and water are not readily available, use an alcohol-based hand sanitizer with at least 60% alcohol.  . If coughing or sneezing, cover your mouth and nose by coughing or sneezing into the elbow areas of your shirt or coat, into a tissue or into your sleeve (not your hands). . Avoid shaking hands with others and consider head nods or verbal greetings only. . Avoid touching your eyes, nose, or mouth with unwashed hands.  . Avoid close contact with people who are sick. . Avoid places or events with large numbers of people in one location, like concerts or sporting events. . Carefully consider travel plans you have or are making. . If you are planning any travel outside or inside the [place], visit the CDC's Travelers' Health webpage for the latest health notices. . If you have some symptoms but not all symptoms, continue to monitor at home and seek medical attention if your symptoms worsen. . If you are having a medical emergency, call 911.   ADDITIONAL HEALTHCARE OPTIONS FOR PATIENTS  [facility] / e-Visit: [email or web address]         [facility]: [phone]  [facility]: [phone]                   [facility]: [phone]   [facility] Discharge Instructions for Patients Receiving Chemotherapy  Today you received the following chemotherapy agents Gazyva   To help prevent nausea and vomiting after your treatment, we encourage you to take your nausea medication as directed.    If you develop nausea and vomiting that is not controlled by your nausea medication, call the clinic.   BELOW ARE  SYMPTOMS THAT SHOULD BE REPORTED IMMEDIATELY:  *FEVER GREATER THAN 100.5 F  *CHILLS WITH OR WITHOUT FEVER  NAUSEA AND VOMITING THAT IS NOT CONTROLLED WITH YOUR NAUSEA MEDICATION  *UNUSUAL SHORTNESS OF BREATH  *UNUSUAL BRUISING OR BLEEDING  TENDERNESS IN MOUTH AND THROAT WITH OR WITHOUT PRESENCE OF ULCERS  *URINARY PROBLEMS  *BOWEL PROBLEMS  UNUSUAL RASH Items with * indicate a potential emergency and should be followed up as soon as possible.  Feel free to call the clinic should you have any questions or concerns. The clinic phone number is [phone].  Please show the CHEMO ALERT CARD at check-in to the Emergency Department and triage nurse.   

## 2019-06-27 NOTE — Telephone Encounter (Signed)
Received schedule in infusion

## 2019-06-27 NOTE — Assessment & Plan Note (Signed)
She has elevated blood pressure could be attributed to anxiety She will continue medical management

## 2019-06-27 NOTE — Assessment & Plan Note (Signed)
Her last CT imaging is stable She tolerated treatment well without side effects She will continue treatment for another 3 months before planning to repeat CT again at the end of October

## 2019-06-27 NOTE — Progress Notes (Signed)
[facility] OFFICE PROGRESS NOTE  Patient Care Team: Janith Lima, MD as PCP - General (Internal Medicine) Constance Haw, MD as PCP - Electrophysiology (Cardiology) Constance Haw, MD as Consulting Physician (Cardiology)  ASSESSMENT & PLAN:  CLL (chronic lymphocytic leukemia) [facility]) Her last CT imaging is stable She tolerated treatment well without side effects She will continue treatment for another 3 months before planning to repeat CT again at the end of October  Pancytopenia, acquired [facility]) She has mild intermittent pancytopenia due to treatment but not symptomatic Observe only for now. We discussed neutropenic precaution We discussed the importance of influenza vaccination  Hypertension She has elevated blood pressure could be attributed to anxiety She will continue medical management   Orders Placed This Encounter  Procedures  . CT ABDOMEN PELVIS W CONTRAST    Standing Status:   Future    Standing Expiration Date:   06/26/2020    Order Specific Question:   If indicated for the ordered procedure, I authorize the administration of contrast media per Radiology protocol    Answer:   Yes    Order Specific Question:   Preferred imaging location?    Answer:   [facility]    Order Specific Question:   Radiology Contrast Protocol - do NOT remove file path    Answer:   \\[email or web address]    Order Specific Question:   Is patient pregnant?    Answer:   No  . CT CHEST W CONTRAST    Standing Status:   Future    Standing Expiration Date:   06/26/2020    Order Specific Question:   If indicated for the ordered procedure, I authorize the administration of contrast media per Radiology protocol    Answer:   Yes    Order Specific Question:   Preferred imaging location?    Answer:   [facility]    Order Specific Question:   Radiology Contrast Protocol - do NOT remove file path    Answer:    \\[email or web address]    Order Specific Question:   Is patient pregnant?    Answer:   No    INTERVAL HISTORY: Please see below for problem oriented charting. She returns for further follow-up and treatment She tolerated treatment well No new lymphadenopathy Denies recent infection, fever or chills The patient denies any recent signs or symptoms of bleeding such as spontaneous epistaxis, hematuria or hematochezia.  SUMMARY OF ONCOLOGIC HISTORY: Oncology History  CLL (chronic lymphocytic leukemia) ([place])  04/05/2015 Pathology Results   Accession: [id number] flow cytometry confirmed CLL. FISH was positive for p53 mutation   04/24/2015 Imaging   Extensive lymphadenopathy throughout the neck, chest (axilla), abdomen and pelvis, as detailed above, compatible with the reported clinical history of lymphoma. 2. Mild splenomegaly.   05/03/2015 - 08/27/2015 Chemotherapy   She started on Ibrutinib, discontinued prematurely when her prescription ran out   10/10/2015 - 05/29/2017 Chemotherapy   She was restarted back on Ibrutinib   11/13/2016 PET scan   Significant generalized reduction in size of numerous lymph nodes in the neck, chest, abdomen, and pelvis. Previously the activity of these nodes was low-level and in general a similar low-level activity is present today, significantly less than mediastinal blood pool activity, compatible with Deauville 2. 2. Coronary atherosclerosis. Mild cardiomegaly. 3. Mildly prominent endometrium for age without accentuated metabolic activity in the endometrium. Consider pelvic sonography for further characterization.   05/27/2017 PET scan   1. Progressive hypermetabolic adenopathy, primarily  involving cervical, axillary, pelvic and inguinal lymph nodes bilaterally, consistent with progressive lymphoma. 2. No solid visceral organ or osseous involvement.   06/16/2017 - 08/25/2017 Chemotherapy   The patient had 3 cycles of Rituximab and  Bendamustine   07/15/2017 - 07/19/2017 Hospital Admission   The patient was briefly admitted to the hospital due to infusion reaction to rituximab   09/18/2017 PET scan   1. Continued considerable adenopathy in the neck, chest, abdomen, and pelvis. This is generally stable in size but mildly reduced in activity compared to the prior exam. Current levels of activity primarily Deauville 3 and Deauville 4. No splenomegaly. 2. Diffuse new ground-glass opacities in the lungs with associated hypermetabolic activity. Some forms of lymphoma infiltration can rarely cause this pattern of diffuse ground-glass opacity and hypermetabolic activity. Differential diagnostic considerations might include atypical pneumonia such as mycoplasma, acute hypersensitivity pneumonitis, or acute eosinophilic pneumonia. Pulmonary hemorrhage seems less likely to cause this degree of accentuated metabolic activity.  3. Aortic Atherosclerosis (ICD10-I70.0). Coronary atherosclerosis.   09/24/2017 -  Chemotherapy   The patient had ventoclax for chemotherapy treatment.  Rituximab is added on 11/18/17 to 04/09/18, x 6 cycles   11/19/2017 PET scan   Overall mild interval decrease in hypermetabolic lymphadenopathy throughout the neck, chest, abdomen, and pelvis. No new or increased lymphadenopathy identified.  While there has been resolution of diffuse hypermetabolic bilateral ground-glass pulmonary opacity since prior study, there is a new 14 mm hypermetabolic pulmonary nodule in the posterior right lower lobe. Time course favors inflammatory or infectious etiology over neoplasm. Recommend continued follow-up by chest CT in 3 months.   02/16/2018 PET scan   1. Adenopathy in the neck, chest, and pelvis is stable to minimally reduced in size, and is moderately reduced in activity, primarily Deauville 2 disease today. There is a right common iliac lymph node qualifying as Deauville 3 disease which is stable in size but reduced in  activity. 2. Stable size but reduced activity in a pulmonary nodule in the right lower lobe. If this represents a leukemic lesion then a corresponds to Deauville 4 disease. This lesion was not readily apparent on 09/22/2017 but was reported on the prior chest CT of 11/19/2017. 3. Other imaging findings of potential clinical significance: Mild thyroid goiter. Aortic Atherosclerosis (ICD10-I70.0). Coronary atherosclerosis. Mild cardiomegaly.   04/19/2018 PET scan   1. Interval mild mixed metabolic changes, as detailed. Persistent mildly hypermetabolic bilateral axillary, mediastinal and bilateral pelvic adenopathy and mildly hypermetabolic right lower lobe pulmonary nodule compatible with lymphoproliferative disorder. Deauville 4 based on the subcarinal node. 2. Aortic Atherosclerosis (ICD10-I70.0).   06/29/2018 Procedure   Successful placement of a right internal jugular approach power injectable Port-A-Cath. The catheter is ready for immediate use.   07/26/2018 PET scan   1. Adenopathy primarily in the chest and pelvis as noted above, with size of the mildly enlarged lymph nodes stable to minimally increased, but with nodal activity generally decreased compared to the prior exam. Uptake in these nodes is primarily Deauville 2. 2. Aortic Atherosclerosis (ICD10-I70.0). Coronary atherosclerosis with mild cardiomegaly.   10/29/2018 PET scan   1. Relatively stable sized lymphadenopathy but interval increase in hypermetabolism when compared to the most recent prior PET-CT, as detailed above. 2. No new disease is identified. 3. Stable bilateral pulmonary nodules.   11/12/2018 - 02/02/2019 Chemotherapy   The patient had riTUXimab (RITUXAN) 800 mg in sodium chloride 0.9 % 170 mL infusion, 375 mg/m2 = 800 mg, Intravenous,  Once, 3 of 4 cycles  Administration: 800 mg (11/12/2018), 800 mg (12/10/2018), 800 mg (01/06/2019)  for chemotherapy treatment.    01/11/2019 Imaging   1. Significant increased adenopathy in the  chest, abdomen, and pelvis compared to the 10/28/2018 PET-CT. 2. The scattered tiny pulmonary nodules and larger single right lower lobe pulmonary nodule are all stable. 3. Other imaging findings of potential clinical significance: Aortic Atherosclerosis (ICD10-I70.0). Coronary atherosclerosis. Mild cardiomegaly. Mild diffuse thyroid prominence, stable. Mild left foraminal impingement at L4-5.   01/21/2019 Procedure   Successful ultrasound-guided core biopsies of an enlarged right axillary lymph node.    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 The latter is important because it essentially rules out the possibility of mantle cell lymphoma. CD3 highlights a small population of T-cells. By flow cytometry, a kappa-restricted monoclonal B-cell population that expresses CD19, CD20, CD5, and CD23 comprises 89% of all lymphocytes. Overall, the features are consistent with chronic lymphocytic leukemia/small lymphocytic lymphoma.   01/31/2019 Echocardiogram    1. The left ventricle has normal systolic function, with an ejection fraction of 55-60%. The cavity size was normal. Left ventricular diastolic Doppler parameters are consistent with impaired relaxation. No evidence of left ventricular regional wall motion abnormalities. GLS -20%.  2. The right ventricle has normal systolic function. The cavity was normal. There is no increase in right ventricular wall thickness.  3. The aortic valve is tricuspid. Mild calcification of the aortic valve. No stenosis of the aortic valve.  4. The aortic root is normal in size and structure.  5. 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  6. No evidence of mitral valve stenosis. No significant mitral regurgitation.  7. Normal IVC size. No complete TR doppler jet so unable to estimate PA systolic pressure.   02/03/2019 -  Chemotherapy   The patient had obinutuzumab and Acalabrutinib for chemotherapy treatment.    05/02/2019 Imaging   1. Interval decrease in size axillary and mediastinal adenopathy. 2. There are a few retroperitoneal lymph nodes which are mildly increased in size. The majority of the retroperitoneal and pelvic lymph nodes are grossly similar when compared to prior exam. 3. Stable scattered pulmonary nodules.     REVIEW OF SYSTEMS:   Constitutional: Denies fevers, chills or abnormal weight loss Eyes: Denies blurriness of vision Ears, nose, mouth, throat, and face: Denies mucositis or sore throat Respiratory: Denies cough, dyspnea or wheezes Cardiovascular: Denies palpitation, chest discomfort or lower extremity swelling Gastrointestinal:  Denies nausea, heartburn or change in bowel habits Skin: Denies abnormal skin rashes Lymphatics: Denies new lymphadenopathy or easy bruising Neurological:Denies numbness, tingling or new weaknesses Behavioral/Psych: Mood is stable, no new changes  All other systems were reviewed with the patient and are negative.  I have reviewed the past medical history, past surgical history, social history and family history with the patient and they are unchanged from previous note.  ALLERGIES:  has No Known Allergies.  MEDICATIONS:  Current Outpatient Medications  Medication Sig Dispense Refill  . Acalabrutinib 100 MG CAPS Take 100 mg by mouth 2 (two) times daily. 60 capsule 11  . allopurinol (ZYLOPRIM) 300 MG tablet TAKE 1 TABLET BY MOUTH EVERY DAY 90 tablet 1  . Cholecalciferol (VITAMIN D-3) 1000 units CAPS Take 1,000 Units by mouth daily  with breakfast.    . ELIQUIS 5 MG TABS tablet TAKE 1 TABLET BY MOUTH TWICE A DAY 180 tablet 1  . furosemide (LASIX) 20 MG tablet TAKE 1  TABLET BY MOUTH EVERY DAY AS NEEDED 90 tablet 2  . lidocaine-prilocaine (EMLA) cream Apply 1 application topically as needed. 30 g 6  . metoprolol succinate (TOPROL-XL) 100 MG 24 hr tablet Take 1 1/2 tablets (150 mg total) daily. Take with or immediately following a meal. 135 tablet 2  . potassium chloride SA (K-DUR,KLOR-CON) 20 MEQ tablet Take 1 tablet (20 mEq total) by mouth daily. 30 tablet 11  . sacubitril-valsartan (ENTRESTO) 24-26 MG Take 1 tablet by mouth 2 (two) times daily. 180 tablet 1   No current facility-administered medications for this visit.    Facility-Administered Medications Ordered in Other Visits  Medication Dose Route Frequency Provider Last Rate Last Dose  . heparin lock flush 100 unit/mL  500 Units Intracatheter Once PRN Alvy Bimler, Chrystle Murillo, MD      . influenza vac split quadrivalent PF (FLUARIX) injection 0.5 mL  0.5 mL Intramuscular Once Alvy Bimler, Shaden Lacher, MD      . obinutuzumab (GAZYVA) 1,000 mg in sodium chloride 0.9 % 250 mL (3.4483 mg/mL) chemo infusion  1,000 mg Intravenous Once Marilynne Dupuis, MD      . sodium chloride flush (NS) 0.9 % injection 10 mL  10 mL Intracatheter PRN Alvy Bimler, Titus Drone, MD        PHYSICAL EXAMINATION: ECOG PERFORMANCE STATUS: 0 - Asymptomatic  Vitals:   06/27/19 0836 06/27/19 0838  BP: (!) 152/69 124/77  Pulse: (!) 57   Resp: 18   Temp: 98.2 F (36.8 C)   SpO2: 100%    Filed Weights   06/27/19 0836  Weight: 200 lb 8 oz (90.9 kg)    GENERAL:alert, no distress and comfortable SKIN: skin color, texture, turgor are normal, no rashes or significant lesions EYES: normal, Conjunctiva are pink and non-injected, sclera clear OROPHARYNX:no exudate, no erythema and lips, buccal mucosa, and tongue normal  NECK: supple, thyroid normal size, non-tender, without nodularity LYMPH:  no palpable lymphadenopathy in the cervical, axillary or inguinal LUNGS: clear to auscultation and percussion with normal breathing effort HEART: regular rate & rhythm and no  murmurs and no lower extremity edema ABDOMEN:abdomen soft, non-tender and normal bowel sounds Musculoskeletal:no cyanosis of digits and no clubbing  NEURO: alert & oriented x 3 with fluent speech, no focal motor/sensory deficits  LABORATORY DATA:  I have reviewed the data as listed    Component Value Date/Time   NA 142 06/27/2019 0823   NA 140 10/09/2017 1416   K 3.6 06/27/2019 0823   K 4.4 10/09/2017 1416   CL 109 06/27/2019 0823   CL 105 01/18/2013 0912   CO2 23 06/27/2019 0823   CO2 25 10/09/2017 1416   GLUCOSE 115 (H) 06/27/2019 0823   GLUCOSE 117 10/09/2017 1416   GLUCOSE 83 01/18/2013 0912   BUN 12 06/27/2019 0823   BUN 11.1 10/09/2017 1416   CREATININE 0.76 06/27/2019 0823   CREATININE 0.72 11/01/2018 0903   CREATININE 0.8 10/09/2017 1416   CALCIUM 9.0 06/27/2019 0823   CALCIUM 9.6 10/09/2017 1416   PROT 5.9 (L) 06/27/2019 0823   PROT 6.4 10/09/2017 1416   ALBUMIN 3.9 06/27/2019 0823   ALBUMIN 3.4 (L) 10/09/2017 1416   AST 14 (L) 06/27/2019 0823   AST 17 11/01/2018 0903   AST 18 10/09/2017 1416   ALT 8 06/27/2019 0823   ALT 15 11/01/2018  0903   ALT 19 10/09/2017 1416   ALKPHOS 65 06/27/2019 0823   ALKPHOS 69 10/09/2017 1416   BILITOT 0.9 06/27/2019 0823   BILITOT 1.3 (H) 11/01/2018 0903   BILITOT 0.57 10/09/2017 1416   GFRNONAA >60 06/27/2019 0823   GFRNONAA >60 11/01/2018 0903   GFRAA >60 06/27/2019 0823   GFRAA >60 11/01/2018 0903    No results found for: SPEP, UPEP  Lab Results  Component Value Date   WBC 3.5 (L) 06/27/2019   NEUTROABS 1.4 (L) 06/27/2019   HGB 11.2 (L) 06/27/2019   HCT 34.6 (L) 06/27/2019   MCV 93.5 06/27/2019   PLT 119 (L) 06/27/2019      Chemistry      Component Value Date/Time   NA 142 06/27/2019 0823   NA 140 10/09/2017 1416   K 3.6 06/27/2019 0823   K 4.4 10/09/2017 1416   CL 109 06/27/2019 0823   CL 105 01/18/2013 0912   CO2 23 06/27/2019 0823   CO2 25 10/09/2017 1416   BUN 12 06/27/2019 0823   BUN 11.1  10/09/2017 1416   CREATININE 0.76 06/27/2019 0823   CREATININE 0.72 11/01/2018 0903   CREATININE 0.8 10/09/2017 1416      Component Value Date/Time   CALCIUM 9.0 06/27/2019 0823   CALCIUM 9.6 10/09/2017 1416   ALKPHOS 65 06/27/2019 0823   ALKPHOS 69 10/09/2017 1416   AST 14 (L) 06/27/2019 0823   AST 17 11/01/2018 0903   AST 18 10/09/2017 1416   ALT 8 06/27/2019 0823   ALT 15 11/01/2018 0903   ALT 19 10/09/2017 1416   BILITOT 0.9 06/27/2019 0823   BILITOT 1.3 (H) 11/01/2018 0903   BILITOT 0.57 10/09/2017 1416       All questions were answered. The patient knows to call the clinic with any problems, questions or concerns. No barriers to learning was detected.  I spent 15 minutes counseling the patient face to face. The total time spent in the appointment was 20 minutes and more than 50% was on counseling and review of test results  Heath Lark, MD 06/27/2019 11:00 AM

## 2019-06-27 NOTE — Assessment & Plan Note (Addendum)
She has mild intermittent pancytopenia due to treatment but not symptomatic Observe only for now. We discussed neutropenic precaution We discussed the importance of influenza vaccination

## 2019-07-22 ENCOUNTER — Encounter (HOSPITAL_COMMUNITY): Payer: Self-pay

## 2019-07-22 ENCOUNTER — Other Ambulatory Visit: Payer: Self-pay

## 2019-07-22 ENCOUNTER — Ambulatory Visit (HOSPITAL_COMMUNITY)
Admission: RE | Admit: 2019-07-22 | Discharge: 2019-07-22 | Disposition: A | Discharge status: Home / Self Care | Payer: BC Managed Care – PPO | Source: Ambulatory Visit | Attending: Hematology and Oncology | Admitting: Hematology and Oncology

## 2019-07-22 DIAGNOSIS — C9110 Chronic lymphocytic leukemia of B-cell type not having achieved remission: Secondary | ICD-10-CM | POA: Insufficient documentation

## 2019-07-22 DIAGNOSIS — R918 Other nonspecific abnormal finding of lung field: Secondary | ICD-10-CM | POA: Diagnosis not present

## 2019-07-22 MED ORDER — IOHEXOL 300 MG/ML  SOLN
100.0000 mL | Freq: Once | INTRAMUSCULAR | Status: AC | PRN
  Administered 2019-07-22: 100 mL via INTRAVENOUS

## 2019-07-22 MED ORDER — SODIUM CHLORIDE (PF) 0.9 % IJ SOLN
INTRAMUSCULAR | Status: AC
  Filled 2019-07-22: qty 50

## 2019-07-25 ENCOUNTER — Other Ambulatory Visit: Payer: Self-pay

## 2019-07-25 ENCOUNTER — Encounter: Payer: Self-pay | Admitting: Hematology and Oncology

## 2019-07-25 ENCOUNTER — Inpatient Hospital Stay (HOSPITAL_BASED_OUTPATIENT_CLINIC_OR_DEPARTMENT_OTHER): Payer: BC Managed Care – PPO | Admitting: Hematology and Oncology

## 2019-07-25 ENCOUNTER — Telehealth: Payer: Self-pay | Admitting: Pharmacist

## 2019-07-25 ENCOUNTER — Inpatient Hospital Stay: Payer: BC Managed Care – PPO | Attending: Hematology and Oncology

## 2019-07-25 ENCOUNTER — Inpatient Hospital Stay: Payer: BC Managed Care – PPO

## 2019-07-25 VITALS — BP 139/80 | HR 53 | Temp 98.2°F | Resp 18 | Ht 67.0 in | Wt 198.9 lb

## 2019-07-25 DIAGNOSIS — C9112 Chronic lymphocytic leukemia of B-cell type in relapse: Secondary | ICD-10-CM | POA: Diagnosis not present

## 2019-07-25 DIAGNOSIS — Z9221 Personal history of antineoplastic chemotherapy: Secondary | ICD-10-CM | POA: Diagnosis not present

## 2019-07-25 DIAGNOSIS — C9110 Chronic lymphocytic leukemia of B-cell type not having achieved remission: Secondary | ICD-10-CM | POA: Diagnosis not present

## 2019-07-25 DIAGNOSIS — Z79899 Other long term (current) drug therapy: Secondary | ICD-10-CM | POA: Insufficient documentation

## 2019-07-25 DIAGNOSIS — I428 Other cardiomyopathies: Secondary | ICD-10-CM | POA: Diagnosis not present

## 2019-07-25 DIAGNOSIS — D61818 Other pancytopenia: Secondary | ICD-10-CM | POA: Insufficient documentation

## 2019-07-25 DIAGNOSIS — Z95828 Presence of other vascular implants and grafts: Secondary | ICD-10-CM

## 2019-07-25 DIAGNOSIS — Z452 Encounter for adjustment and management of vascular access device: Secondary | ICD-10-CM

## 2019-07-25 LAB — CBC WITH DIFFERENTIAL/PLATELET
Abs Immature Granulocytes: 0.04 10*3/uL (ref 0.00–0.07)
Basophils Absolute: 0 10*3/uL (ref 0.0–0.1)
Basophils Relative: 1 %
Eosinophils Absolute: 0.2 10*3/uL (ref 0.0–0.5)
Eosinophils Relative: 6 %
HCT: 35.3 % — ABNORMAL LOW (ref 36.0–46.0)
Hemoglobin: 11.3 g/dL — ABNORMAL LOW (ref 12.0–15.0)
Immature Granulocytes: 1 %
Lymphocytes Relative: 44 %
Lymphs Abs: 1.3 10*3/uL (ref 0.7–4.0)
MCH: 29.5 pg (ref 26.0–34.0)
MCHC: 32 g/dL (ref 30.0–36.0)
MCV: 92.2 fL (ref 80.0–100.0)
Monocytes Absolute: 0.5 10*3/uL (ref 0.1–1.0)
Monocytes Relative: 16 %
Neutro Abs: 1 10*3/uL — ABNORMAL LOW (ref 1.7–7.7)
Neutrophils Relative %: 32 %
Platelets: 133 10*3/uL — ABNORMAL LOW (ref 150–400)
RBC: 3.83 MIL/uL — ABNORMAL LOW (ref 3.87–5.11)
RDW: 13 % (ref 11.5–15.5)
WBC: 3 10*3/uL — ABNORMAL LOW (ref 4.0–10.5)
nRBC: 0 % (ref 0.0–0.2)

## 2019-07-25 LAB — COMPREHENSIVE METABOLIC PANEL
ALT: 10 U/L (ref 0–44)
AST: 17 U/L (ref 15–41)
Albumin: 3.8 g/dL (ref 3.5–5.0)
Alkaline Phosphatase: 61 U/L (ref 38–126)
Anion gap: 10 (ref 5–15)
BUN: 13 mg/dL (ref 6–20)
CO2: 24 mmol/L (ref 22–32)
Calcium: 8.9 mg/dL (ref 8.9–10.3)
Chloride: 110 mmol/L (ref 98–111)
Creatinine, Ser: 0.71 mg/dL (ref 0.44–1.00)
GFR calc Af Amer: >60 mL/min (ref >60)
GFR calc non Af Amer: >60 mL/min (ref >60)
Glucose, Bld: 100 mg/dL — ABNORMAL HIGH (ref 70–99)
Potassium: 3.9 mmol/L (ref 3.5–5.1)
Sodium: 144 mmol/L (ref 135–145)
Total Bilirubin: 1.1 mg/dL (ref 0.3–1.2)
Total Protein: 6.1 g/dL — ABNORMAL LOW (ref 6.5–8.1)

## 2019-07-25 MED ORDER — IDELALISIB 150 MG PO TABS: 150.0000 mg | ORAL_TABLET | Freq: Two times a day (BID) | ORAL | 11 refills | Status: DC

## 2019-07-25 MED ORDER — SODIUM CHLORIDE 0.9% FLUSH
10.0000 mL | Freq: Once | INTRAVENOUS | Status: AC
  Administered 2019-07-25: 10 mL
  Filled 2019-07-25: qty 10

## 2019-07-25 NOTE — Patient Instructions (Signed)

## 2019-07-25 NOTE — Assessment & Plan Note (Signed)
She has no current signs or symptoms of congestive heart failure We will monitor carefully

## 2019-07-25 NOTE — Telephone Encounter (Signed)
Oral Oncology Pharmacist Encounter  Received new prescription for Zydelig (idelalisib) monotherapy for the treatment of CLL, planned duration until disease progression or unacceptable toxicity.  Patient originally diagnosed with CLL, p53 mutation positive in June 2016 and has been heavily pretreated. Patient was most recently on acalabrutinib + obinutuzumab, but now with signs of progression on CT scan from 07/22/19. Plan is for pt to initiate zydelig monotherapy at this time.   Plan is for patient to start Zydelig (idelalisib) 150 mg (150 mg tablet) take 1 tablet by mouth 2 times daily. Patient is not going to receive idelalisib in combination with rituximab since she has had previous progression on rituximab.   CMET and CBC w/ diff from 07/25/19 assessed:  CBC with ANC of 1000, pltc 133K - no dose adjustment necessary at this time  CMET stable   Current medication list in Epic reviewed, DDIs with Zydelig identified:  Zydelig is a CYP3A4 inhibitor and man increase apixaban concentrations (Category C) - will counsel patients on monitoring for s/sx of bleeding   Prescription has been e-scribed to the [facility] for benefits analysis and approval.  Oral Oncology Clinic will continue to follow for insurance authorization, copayment issues, initial counseling and start date.  Leron Croak, PharmD PGY2 Hematology/Oncology Pharmacy Resident 07/25/2019 3:19 PM Oral Oncology Clinic [phone]

## 2019-07-25 NOTE — Progress Notes (Signed)
[place] OFFICE PROGRESS NOTE  Patient Care Team: Janith Lima, MD as PCP - General (Internal Medicine) Constance Haw, MD as PCP - Electrophysiology (Cardiology) Constance Haw, MD as Consulting Physician (Cardiology)  ASSESSMENT & PLAN:  CLL (chronic lymphocytic leukemia) [facility]) Unfortunately, CT imaging showed disease progression I reviewed all the imaging studies with the patient Thankfully, she is not symptomatic I recommend she completes the remaining prescription of acalabrutinib We will cancel her Dyann Kief treatment today We reviewed the current guidelines  This is based on recent FDA approval and strong recommendation from NCCN guidelines, based on publication below  We discussed the role of treatment is strictly palliative  Clinical Trial J Clin Oncol. 2019 Jun 1;37(16):1391-1402. doi: 10.1200/JCO.18.01460. Epub 2019 Apr 17. Final Results of a Randomized, Phase III Study of Rituximab With or Without Idelalisib Followed by Open-Label Idelalisib in Patients With Relapsed Chronic Lymphocytic Leukemia Bryna Colander Sharman 1, Virgina Evener Coutre 2, Richard Darreld Mclean 3, Marcelino Duster 4, Latricia Heft Pagel 5, [place] 6, [place] 7, Hazle Coca 8, Gwen Her Kipps 9, [place] [address] [place] [address] [place] 15 16, [place] 17, [place], Mila Merry [address] Celine Mans 21, Siddhartha Mitra 21, Michael Shidler 20, Williams Che 22, Stephan St[address]bauer 19 Affiliations expand PMID: [id number] DOI: 10.1200/JCO.18.01460  Abstract Purpose: A randomized, double-blind, phase III study of idelalisib (IDELA) plus rituximab versus placebo plus rituximab in patients with relapsed chronic lymphocytic leukemia (CLL) was terminated early because of superior efficacy of the IDELA-plus-rituximab (IDELA/R) arm. Patients in either arm could then enroll in an extension study to  receive IDELA monotherapy. Here, we report the long-term efficacy and safety data for IDELA-treated patients across the primary and extension studies.  Patients and methods:  Patients were randomly assigned to receive rituximab in combination with either IDELA 150 mg twice daily (IDELA/R; n = 110) or placebo (placebo/R; n = 110). Key end points were progression-free survival (PFS), overall response rate (ORR), overall survival (OS), and safety.  Results:  The long-term efficacy and safety of treatment with IDELA was assessed in 110 patients who received at least one dose of IDELA in the primary study, 75 of whom enrolled in the extension study. The IDELA/R-to-IDELA group had a median PFS of 20.3 months (95% CI, 17.3 to 26.3 months) after a median follow-up time of 18 months (range, 0.3 to 67.6 months). The ORR was 85.5% (94 of 110 patients; n = 1 complete response). The median OS was 40.6 months (95% CI, 28.5 to 57.3 months) and 34.6 months (95% CI, 16.0 months to not reached) for patients randomly assigned to the IDELA/R and placebo/R groups, respectively. Prolonged exposure to [facility] increased the incidence of all-grade, grade 2, and grade 3 or greater diarrhea (46.4%, 17.3%, and 16.4%, respectively), all-grade and grade 3 or greater colitis (10.9% and 8.2%, respectively) and all-grade and grade 3 or greater pneumonitis (10.0% and 6.4%, respectively) but did not increase the incidence of elevated hepatic aminotransferases.  Conclusion:  IDELA improved PFS and OS compared with rituximab alone in patients with relapsed CLL. Long-term IDELA was effective and had an expected safety profile. No new IDELA-related adverse events were identified with longer exposure.  Some of the short term side-effects included, though not limited to, risk of fatigue, weight loss, tumor lysis syndrome, risk of allergic reactions, pancytopenia, risk of blood clots, life-threatening  infections, abnormal liver enzymes, diarrhea,  risks of hyperglycemia, need for transfusions of blood products, admission to hospital for various reasons, and risks of death.   The patient is aware that the response rates discussed earlier is not guaranteed.    After a long discussion, patient made an informed decision to proceed with the prescribed plan of care.   Due to her previous documented progression on rituximab, I do not plan to repeat treatment with rituximab again  Pancytopenia, acquired [facility]) She has mild pancytopenia We will observe for now She is not symptomatic  Nonischemic cardiomyopathy ([place]) She has no current signs or symptoms of congestive heart failure We will monitor carefully   Orders Placed This Encounter  Procedures  . Lactate dehydrogenase    Standing Status:   Standing    Number of Occurrences:   5    Standing Expiration Date:   07/24/2020  . Uric acid    Standing Status:   Standing    Number of Occurrences:   5    Standing Expiration Date:   07/24/2020    INTERVAL HISTORY: Please see below for problem oriented charting. She returns for treatment and follow-up She feels well No infusion reaction Denies recent infection, fever or chills No new lymphadenopathy  SUMMARY OF ONCOLOGIC HISTORY: Oncology History Overview Note  17p positive   CLL (chronic lymphocytic leukemia) ([place])  04/05/2015 Pathology Results   Accession: [id number] flow cytometry confirmed CLL. FISH was positive for p53 mutation   04/24/2015 Imaging   Extensive lymphadenopathy throughout the neck, chest (axilla), abdomen and pelvis, as detailed above, compatible with the reported clinical history of lymphoma. 2. Mild splenomegaly.   05/03/2015 - 08/27/2015 Chemotherapy   She started on Ibrutinib, discontinued prematurely when her prescription ran out   10/10/2015 - 05/29/2017 Chemotherapy   She was restarted back on Ibrutinib   11/13/2016 PET scan   Significant generalized reduction in size of numerous lymph nodes in the  neck, chest, abdomen, and pelvis. Previously the activity of these nodes was low-level and in general a similar low-level activity is present today, significantly less than mediastinal blood pool activity, compatible with Deauville 2. 2. Coronary atherosclerosis. Mild cardiomegaly. 3. Mildly prominent endometrium for age without accentuated metabolic activity in the endometrium. Consider pelvic sonography for further characterization.   05/27/2017 PET scan   1. Progressive hypermetabolic adenopathy, primarily involving cervical, axillary, pelvic and inguinal lymph nodes bilaterally, consistent with progressive lymphoma. 2. No solid visceral organ or osseous involvement.   06/16/2017 - 08/25/2017 Chemotherapy   The patient had 3 cycles of Rituximab and Bendamustine   07/15/2017 - 07/19/2017 Hospital Admission   The patient was briefly admitted to the hospital due to infusion reaction to rituximab   09/18/2017 PET scan   1. Continued considerable adenopathy in the neck, chest, abdomen, and pelvis. This is generally stable in size but mildly reduced in activity compared to the prior exam. Current levels of activity primarily Deauville 3 and Deauville 4. No splenomegaly. 2. Diffuse new ground-glass opacities in the lungs with associated hypermetabolic activity. Some forms of lymphoma infiltration can rarely cause this pattern of diffuse ground-glass opacity and hypermetabolic activity. Differential diagnostic considerations might include atypical pneumonia such as mycoplasma, acute hypersensitivity pneumonitis, or acute eosinophilic pneumonia. Pulmonary hemorrhage seems less likely to cause this degree of accentuated metabolic activity.  3. Aortic Atherosclerosis (ICD10-I70.0). Coronary atherosclerosis.   09/24/2017 -  Chemotherapy   The patient had ventoclax for chemotherapy treatment.  Rituximab is added on 11/18/17  to 04/09/18, x 6 cycles   11/19/2017 PET scan   Overall mild interval decrease in  hypermetabolic lymphadenopathy throughout the neck, chest, abdomen, and pelvis. No new or increased lymphadenopathy identified.  While there has been resolution of diffuse hypermetabolic bilateral ground-glass pulmonary opacity since prior study, there is a new 14 mm hypermetabolic pulmonary nodule in the posterior right lower lobe. Time course favors inflammatory or infectious etiology over neoplasm. Recommend continued follow-up by chest CT in 3 months.   02/16/2018 PET scan   1. Adenopathy in the neck, chest, and pelvis is stable to minimally reduced in size, and is moderately reduced in activity, primarily Deauville 2 disease today. There is a right common iliac lymph node qualifying as Deauville 3 disease which is stable in size but reduced in activity. 2. Stable size but reduced activity in a pulmonary nodule in the right lower lobe. If this represents a leukemic lesion then a corresponds to Deauville 4 disease. This lesion was not readily apparent on 09/22/2017 but was reported on the prior chest CT of 11/19/2017. 3. Other imaging findings of potential clinical significance: Mild thyroid goiter. Aortic Atherosclerosis (ICD10-I70.0). Coronary atherosclerosis. Mild cardiomegaly.   04/19/2018 PET scan   1. Interval mild mixed metabolic changes, as detailed. Persistent mildly hypermetabolic bilateral axillary, mediastinal and bilateral pelvic adenopathy and mildly hypermetabolic right lower lobe pulmonary nodule compatible with lymphoproliferative disorder. Deauville 4 based on the subcarinal node. 2. Aortic Atherosclerosis (ICD10-I70.0).   06/29/2018 Procedure   Successful placement of a right internal jugular approach power injectable Port-A-Cath. The catheter is ready for immediate use.   07/26/2018 PET scan   1. Adenopathy primarily in the chest and pelvis as noted above, with size of the mildly enlarged lymph nodes stable to minimally increased, but with nodal activity generally decreased  compared to the prior exam. Uptake in these nodes is primarily Deauville 2. 2. Aortic Atherosclerosis (ICD10-I70.0). Coronary atherosclerosis with mild cardiomegaly.   10/29/2018 PET scan   1. Relatively stable sized lymphadenopathy but interval increase in hypermetabolism when compared to the most recent prior PET-CT, as detailed above. 2. No new disease is identified. 3. Stable bilateral pulmonary nodules.   11/12/2018 - 02/02/2019 Chemotherapy   The patient had riTUXimab (RITUXAN) 800 mg in sodium chloride 0.9 % 170 mL infusion, 375 mg/m2 = 800 mg, Intravenous,  Once, 3 of 4 cycles Administration: 800 mg (11/12/2018), 800 mg (12/10/2018), 800 mg (01/06/2019)  for chemotherapy treatment.    01/11/2019 Imaging   1. Significant increased adenopathy in the chest, abdomen, and pelvis compared to the 10/28/2018 PET-CT. 2. The scattered tiny pulmonary nodules and larger single right lower lobe pulmonary nodule are all stable. 3. Other imaging findings of potential clinical significance: Aortic Atherosclerosis (ICD10-I70.0). Coronary atherosclerosis. Mild cardiomegaly. Mild diffuse thyroid prominence, stable. Mild left foraminal impingement at L4-5.   01/21/2019 Procedure   Successful ultrasound-guided core biopsies of an enlarged right axillary lymph node.    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 The latter is important because it essentially rules out the possibility of mantle cell lymphoma. CD3 highlights a small population of T-cells. By flow cytometry, a kappa-restricted monoclonal B-cell population that expresses CD19, CD20, CD5, and CD23 comprises 89% of all lymphocytes. Overall,  the features  are consistent with chronic lymphocytic leukemia/small lymphocytic lymphoma.   01/31/2019 Echocardiogram    1. The left ventricle has normal systolic function, with an ejection fraction of 55-60%. The cavity size was normal. Left ventricular diastolic Doppler parameters are consistent with impaired relaxation. No evidence of left ventricular regional wall motion abnormalities. GLS -20%.  2. The right ventricle has normal systolic function. The cavity was normal. There is no increase in right ventricular wall thickness.  3. The aortic valve is tricuspid. Mild calcification of the aortic valve. No stenosis of the aortic valve.  4. The aortic root is normal in size and structure.  5. 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  6. No evidence of mitral valve stenosis. No significant mitral regurgitation.  7. Normal IVC size. No complete TR doppler jet so unable to estimate PA systolic pressure.   02/03/2019 - 07/24/2019 Chemotherapy   The patient had obinutuzumab and Acalabrutinib for chemotherapy treatment.    05/02/2019 Imaging   1. Interval decrease in size axillary and mediastinal adenopathy. 2. There are a few retroperitoneal lymph nodes which are mildly increased in size. The majority of the retroperitoneal and pelvic lymph nodes are grossly similar when compared to prior exam. 3. Stable scattered pulmonary nodules.   07/22/2019 Imaging   1. Progressive disease within the neck, chest, abdomen, and pelvis, as evidenced by increase in adenopathy. 2. Similar bilateral pulmonary nodules. 3. Extensive right-sided colonic pneumatosis and small volume extracolonic intraperitoneal gas. Correlate with abdominal symptoms. Of note, pneumatosis has been described in patients on immunotherapy. 4. Age advanced coronary artery atherosclerosis. Recommend assessment of coronary risk factors and consideration of medical therapy. 5.  Aortic Atherosclerosis (ICD10-I70.0). 6. Similar  mild L1 compression deformity. 7. Trace free pelvic fluid, similar. 8. Pulmonary artery enlargement suggests pulmonary arterial hypertension.     REVIEW OF SYSTEMS:   Constitutional: Denies fevers, chills or abnormal weight loss Eyes: Denies blurriness of vision Ears, nose, mouth, throat, and face: Denies mucositis or sore throat Respiratory: Denies cough, dyspnea or wheezes Cardiovascular: Denies palpitation, chest discomfort or lower extremity swelling Gastrointestinal:  Denies nausea, heartburn or change in bowel habits Skin: Denies abnormal skin rashes Lymphatics: Denies new lymphadenopathy or easy bruising Neurological:Denies numbness, tingling or new weaknesses Behavioral/Psych: Mood is stable, no new changes  All other systems were reviewed with the patient and are negative.  I have reviewed the past medical history, past surgical history, social history and family history with the patient and they are unchanged from previous note.  ALLERGIES:  has No Known Allergies.  MEDICATIONS:  Current Outpatient Medications  Medication Sig Dispense Refill  . allopurinol (ZYLOPRIM) 300 MG tablet TAKE 1 TABLET BY MOUTH EVERY DAY 90 tablet 1  . Cholecalciferol (VITAMIN D-3) 1000 units CAPS Take 1,000 Units by mouth daily with breakfast.    . ELIQUIS 5 MG TABS tablet TAKE 1 TABLET BY MOUTH TWICE A DAY 180 tablet 1  . furosemide (LASIX) 20 MG tablet TAKE 1 TABLET BY MOUTH EVERY DAY AS NEEDED 90 tablet 2  . idelalisib (ZYDELIG) 150 MG tablet Take 1 tablet (150 mg total) by mouth 2 (two) times daily. 60 tablet 11  . lidocaine-prilocaine (EMLA) cream Apply 1 application topically as needed. 30 g 6  . metoprolol succinate (TOPROL-XL) 100 MG 24 hr tablet Take 1 1/2 tablets (150 mg total) daily. Take with or immediately following a meal. 135 tablet 2  . potassium chloride SA (K-DUR,KLOR-CON) 20 MEQ tablet Take 1 tablet (20  mEq total) by mouth daily. 30 tablet 11  . sacubitril-valsartan (ENTRESTO)  24-26 MG Take 1 tablet by mouth 2 (two) times daily. 180 tablet 1   No current facility-administered medications for this visit.     PHYSICAL EXAMINATION: ECOG PERFORMANCE STATUS: 1 - Symptomatic but completely ambulatory  Vitals:   07/25/19 0902  BP: 139/80  Pulse: (!) 53  Resp: 18  Temp: 98.2 F (36.8 C)  SpO2: 100%   Filed Weights   07/25/19 0902  Weight: 198 lb 14.4 oz (90.2 kg)    GENERAL:alert, no distress and comfortable Musculoskeletal:no cyanosis of digits and no clubbing  NEURO: alert & oriented x 3 with fluent speech, no focal motor/sensory deficits  LABORATORY DATA:  I have reviewed the data as listed    Component Value Date/Time   NA 144 07/25/2019 0845   NA 140 10/09/2017 1416   K 3.9 07/25/2019 0845   K 4.4 10/09/2017 1416   CL 110 07/25/2019 0845   CL 105 01/18/2013 0912   CO2 24 07/25/2019 0845   CO2 25 10/09/2017 1416   GLUCOSE 100 (H) 07/25/2019 0845   GLUCOSE 117 10/09/2017 1416   GLUCOSE 83 01/18/2013 0912   BUN 13 07/25/2019 0845   BUN 11.1 10/09/2017 1416   CREATININE 0.71 07/25/2019 0845   CREATININE 0.72 11/01/2018 0903   CREATININE 0.8 10/09/2017 1416   CALCIUM 8.9 07/25/2019 0845   CALCIUM 9.6 10/09/2017 1416   PROT 6.1 (L) 07/25/2019 0845   PROT 6.4 10/09/2017 1416   ALBUMIN 3.8 07/25/2019 0845   ALBUMIN 3.4 (L) 10/09/2017 1416   AST 17 07/25/2019 0845   AST 17 11/01/2018 0903   AST 18 10/09/2017 1416   ALT 10 07/25/2019 0845   ALT 15 11/01/2018 0903   ALT 19 10/09/2017 1416   ALKPHOS 61 07/25/2019 0845   ALKPHOS 69 10/09/2017 1416   BILITOT 1.1 07/25/2019 0845   BILITOT 1.3 (H) 11/01/2018 0903   BILITOT 0.57 10/09/2017 1416   GFRNONAA >60 07/25/2019 0845   GFRNONAA >60 11/01/2018 0903   GFRAA >60 07/25/2019 0845   GFRAA >60 11/01/2018 0903    No results found for: SPEP, UPEP  Lab Results  Component Value Date   WBC 3.0 (L) 07/25/2019   NEUTROABS 1.0 (L) 07/25/2019   HGB 11.3 (L) 07/25/2019   HCT 35.3 (L)  07/25/2019   MCV 92.2 07/25/2019   PLT 133 (L) 07/25/2019      Chemistry      Component Value Date/Time   NA 144 07/25/2019 0845   NA 140 10/09/2017 1416   K 3.9 07/25/2019 0845   K 4.4 10/09/2017 1416   CL 110 07/25/2019 0845   CL 105 01/18/2013 0912   CO2 24 07/25/2019 0845   CO2 25 10/09/2017 1416   BUN 13 07/25/2019 0845   BUN 11.1 10/09/2017 1416   CREATININE 0.71 07/25/2019 0845   CREATININE 0.72 11/01/2018 0903   CREATININE 0.8 10/09/2017 1416      Component Value Date/Time   CALCIUM 8.9 07/25/2019 0845   CALCIUM 9.6 10/09/2017 1416   ALKPHOS 61 07/25/2019 0845   ALKPHOS 69 10/09/2017 1416   AST 17 07/25/2019 0845   AST 17 11/01/2018 0903   AST 18 10/09/2017 1416   ALT 10 07/25/2019 0845   ALT 15 11/01/2018 0903   ALT 19 10/09/2017 1416   BILITOT 1.1 07/25/2019 0845   BILITOT 1.3 (H) 11/01/2018 0903   BILITOT 0.57 10/09/2017 1416  RADIOGRAPHIC STUDIES: I have reviewed multiple imaging studies with the patient I have personally reviewed the radiological images as listed and agreed with the findings in the report. Ct Chest W Contrast  Result Date: 07/22/2019 CLINICAL DATA:  Chronic lymphocytic leukemia/lymphoma. Immunotherapy and chemotherapy ongoing. Diagnosed in 2010. EXAM: CT CHEST, ABDOMEN, AND PELVIS WITH CONTRAST TECHNIQUE: Multidetector CT imaging of the chest, abdomen and pelvis was performed following the standard protocol during bolus administration of intravenous contrast. CONTRAST:  163m OMNIPAQUE IOHEXOL 300 MG/ML  SOLN COMPARISON:  05/02/2019 FINDINGS: CT CHEST FINDINGS Cardiovascular: Right Port-A-Cath tip at high high right atrium. Tortuous thoracic aorta. Mild cardiomegaly, without pericardial effusion. Multivessel coronary artery atherosclerosis. No central pulmonary embolism, on this non-dedicated study. Pulmonary artery enlargement, outflow tract 3.2 cm. Mediastinum/Nodes: Low jugular/supraclavicular adenopathy. Index left-sided node of 11  mm on 04/02, increased from 6 mm on the prior. Bilateral axillary adenopathy. An index deep left axillary node measures 2.6 cm on 12/02 versus 1.1 cm on the prior exam (when remeasured). Index right axillary node measures 2.0 cm on 15/2 versus 1.5 cm on the prior exam (when remeasured). No mediastinal or hilar adenopathy. Lungs/Pleura: No pleural fluid. Redemonstration of bilateral pulmonary nodules. An index right lower lobe pulmonary nodule measures 1.3 x 1.2 cm on 84/4. Similar. A subpleural posterior left upper lobe 4 mm nodule on 52/4 is also not significantly changed. Index right upper lobe pulmonary nodule of 3 mm on 46/4, similar. Musculoskeletal: No acute osseous abnormality. CT ABDOMEN PELVIS FINDINGS Hepatobiliary: Normal liver. Normal gallbladder, without biliary ductal dilatation. Pancreas: Normal, without mass or ductal dilatation. Spleen: Normal in size, without focal abnormality. Adrenals/Urinary Tract: Normal adrenal glands. Normal left kidney. Interpolar right renal lesion is similar in size to on the prior but too small to characterize. Normal urinary bladder. Stomach/Bowel: Normal stomach, without wall thickening. Right colonic pneumatosis and adjacent free intraperitoneal air, including on image 70/2 and image 73/2. Normal small bowel. Vascular/Lymphatic: Aortic and branch vessel atherosclerosis. Abdominal retroperitoneal adenopathy. Index left periaortic node measures 1.0 cm on 73/2 versus 8 mm on the prior. Small bowel mesenteric node measures 7 mm on 70/2 versus 5 mm on the prior (when remeasured). Right common iliac node measures 1.5 cm on 88/2 versus 1.0 cm on the prior (when remeasured). Left external iliac node measures 1.8 cm on 104/2 versus 1.4 cm on the prior. Reproductive: Normal uterus and adnexa. Other: Trace pelvic fluid is similar. Musculoskeletal: Trace L4-5 anterolisthesis. Mild superior endplate compression deformity at L1 is not significantly changed. IMPRESSION: 1.  Progressive disease within the neck, chest, abdomen, and pelvis, as evidenced by increase in adenopathy. 2. Similar bilateral pulmonary nodules. 3. Extensive right-sided colonic pneumatosis and small volume extracolonic intraperitoneal gas. Correlate with abdominal symptoms. Of note, pneumatosis has been described in patients on immunotherapy. 4. Age advanced coronary artery atherosclerosis. Recommend assessment of coronary risk factors and consideration of medical therapy. 5.  Aortic Atherosclerosis (ICD10-I70.0). 6. Similar mild L1 compression deformity. 7. Trace free pelvic fluid, similar. 8. Pulmonary artery enlargement suggests pulmonary arterial hypertension. These results will be called to the ordering clinician or representative by the Radiologist Assistant, and communication documented in the PACS or zVision Dashboard. Electronically Signed   By: KAbigail MiyamotoM.D.   On: 07/22/2019 08:24   Ct Abdomen Pelvis W Contrast  Result Date: 07/22/2019 CLINICAL DATA:  Chronic lymphocytic leukemia/lymphoma. Immunotherapy and chemotherapy ongoing. Diagnosed in 2010. EXAM: CT CHEST, ABDOMEN, AND PELVIS WITH CONTRAST TECHNIQUE: Multidetector CT imaging of the chest, abdomen  and pelvis was performed following the standard protocol during bolus administration of intravenous contrast. CONTRAST:  153m OMNIPAQUE IOHEXOL 300 MG/ML  SOLN COMPARISON:  05/02/2019 FINDINGS: CT CHEST FINDINGS Cardiovascular: Right Port-A-Cath tip at high high right atrium. Tortuous thoracic aorta. Mild cardiomegaly, without pericardial effusion. Multivessel coronary artery atherosclerosis. No central pulmonary embolism, on this non-dedicated study. Pulmonary artery enlargement, outflow tract 3.2 cm. Mediastinum/Nodes: Low jugular/supraclavicular adenopathy. Index left-sided node of 11 mm on 04/02, increased from 6 mm on the prior. Bilateral axillary adenopathy. An index deep left axillary node measures 2.6 cm on 12/02 versus 1.1 cm on the  prior exam (when remeasured). Index right axillary node measures 2.0 cm on 15/2 versus 1.5 cm on the prior exam (when remeasured). No mediastinal or hilar adenopathy. Lungs/Pleura: No pleural fluid. Redemonstration of bilateral pulmonary nodules. An index right lower lobe pulmonary nodule measures 1.3 x 1.2 cm on 84/4. Similar. A subpleural posterior left upper lobe 4 mm nodule on 52/4 is also not significantly changed. Index right upper lobe pulmonary nodule of 3 mm on 46/4, similar. Musculoskeletal: No acute osseous abnormality. CT ABDOMEN PELVIS FINDINGS Hepatobiliary: Normal liver. Normal gallbladder, without biliary ductal dilatation. Pancreas: Normal, without mass or ductal dilatation. Spleen: Normal in size, without focal abnormality. Adrenals/Urinary Tract: Normal adrenal glands. Normal left kidney. Interpolar right renal lesion is similar in size to on the prior but too small to characterize. Normal urinary bladder. Stomach/Bowel: Normal stomach, without wall thickening. Right colonic pneumatosis and adjacent free intraperitoneal air, including on image 70/2 and image 73/2. Normal small bowel. Vascular/Lymphatic: Aortic and branch vessel atherosclerosis. Abdominal retroperitoneal adenopathy. Index left periaortic node measures 1.0 cm on 73/2 versus 8 mm on the prior. Small bowel mesenteric node measures 7 mm on 70/2 versus 5 mm on the prior (when remeasured). Right common iliac node measures 1.5 cm on 88/2 versus 1.0 cm on the prior (when remeasured). Left external iliac node measures 1.8 cm on 104/2 versus 1.4 cm on the prior. Reproductive: Normal uterus and adnexa. Other: Trace pelvic fluid is similar. Musculoskeletal: Trace L4-5 anterolisthesis. Mild superior endplate compression deformity at L1 is not significantly changed. IMPRESSION: 1. Progressive disease within the neck, chest, abdomen, and pelvis, as evidenced by increase in adenopathy. 2. Similar bilateral pulmonary nodules. 3. Extensive  right-sided colonic pneumatosis and small volume extracolonic intraperitoneal gas. Correlate with abdominal symptoms. Of note, pneumatosis has been described in patients on immunotherapy. 4. Age advanced coronary artery atherosclerosis. Recommend assessment of coronary risk factors and consideration of medical therapy. 5.  Aortic Atherosclerosis (ICD10-I70.0). 6. Similar mild L1 compression deformity. 7. Trace free pelvic fluid, similar. 8. Pulmonary artery enlargement suggests pulmonary arterial hypertension. These results will be called to the ordering clinician or representative by the Radiologist Assistant, and communication documented in the PACS or zVision Dashboard. Electronically Signed   By: KAbigail MiyamotoM.D.   On: 07/22/2019 08:24    All questions were answered. The patient knows to call the clinic with any problems, questions or concerns. No barriers to learning was detected.  I spent 30 minutes counseling the patient face to face. The total time spent in the appointment was 40 minutes and more than 50% was on counseling and review of test results  NHeath Lark MD 07/25/2019 10:16 AM

## 2019-07-25 NOTE — Assessment & Plan Note (Signed)
She has mild pancytopenia We will observe for now She is not symptomatic

## 2019-07-25 NOTE — Assessment & Plan Note (Addendum)
Unfortunately, CT imaging showed disease progression I reviewed all the imaging studies with the patient Thankfully, she is not symptomatic I recommend she completes the remaining prescription of acalabrutinib We will cancel her Dyann Kief treatment today We reviewed the current guidelines  This is based on recent FDA approval and strong recommendation from NCCN guidelines, based on publication below  We discussed the role of treatment is strictly palliative  Clinical Trial J Clin Oncol. 2019 Jun 1;37(16):1391-1402. doi: 10.1200/JCO.18.01460. Epub 2019 Apr 17. Final Results of a Randomized, Phase III Study of Rituximab With or Without Idelalisib Followed by Open-Label Idelalisib in Patients With Relapsed Chronic Lymphocytic Leukemia Bryna Colander Sharman 1, Virgina Evener Coutre 2, Richard Darreld Mclean 3, Marcelino Duster 4, Latricia Heft Pagel 5, [place] 6, [place] 7, Hazle Coca 8, Gwen Her [place] 9, [place] [address] [place] [address] [place] 15 16, [place] Pettitt 17, [place], Mila Merry [address] Celine Mans 21, Siddhartha Mitra 21, Michael [place] 20, Williams Che 22, Stephan St[address]auer 19 Affiliations expand PMID: [id number] DOI: 10.1200/JCO.18.01460  Abstract Purpose: A randomized, double-blind, phase III study of idelalisib (IDELA) plus rituximab versus placebo plus rituximab in patients with relapsed chronic lymphocytic leukemia (CLL) was terminated early because of superior efficacy of the IDELA-plus-rituximab (IDELA/R) arm. Patients in either arm could then enroll in an extension study to receive IDELA monotherapy. Here, we report the long-term efficacy and safety data for IDELA-treated patients across the primary and extension studies.  Patients and methods:  Patients were randomly assigned to receive rituximab in combination with either IDELA 150 mg twice daily (IDELA/R; n = 110) or placebo (placebo/R; n =  110). Key end points were progression-free survival (PFS), overall response rate (ORR), overall survival (OS), and safety.  Results:  The long-term efficacy and safety of treatment with IDELA was assessed in 110 patients who received at least one dose of IDELA in the primary study, 75 of whom enrolled in the extension study. The IDELA/R-to-IDELA group had a median PFS of 20.3 months (95% CI, 17.3 to 26.3 months) after a median follow-up time of 18 months (range, 0.3 to 67.6 months). The ORR was 85.5% (94 of 110 patients; n = 1 complete response). The median OS was 40.6 months (95% CI, 28.5 to 57.3 months) and 34.6 months (95% CI, 16.0 months to not reached) for patients randomly assigned to the IDELA/R and placebo/R groups, respectively. Prolonged exposure to [facility] increased the incidence of all-grade, grade 2, and grade 3 or greater diarrhea (46.4%, 17.3%, and 16.4%, respectively), all-grade and grade 3 or greater colitis (10.9% and 8.2%, respectively) and all-grade and grade 3 or greater pneumonitis (10.0% and 6.4%, respectively) but did not increase the incidence of elevated hepatic aminotransferases.  Conclusion:  IDELA improved PFS and OS compared with rituximab alone in patients with relapsed CLL. Long-term IDELA was effective and had an expected safety profile. No new IDELA-related adverse events were identified with longer exposure.  Some of the short term side-effects included, though not limited to, risk of fatigue, weight loss, tumor lysis syndrome, risk of allergic reactions, pancytopenia, risk of blood clots, life-threatening infections, abnormal liver enzymes, diarrhea, risks of hyperglycemia, need for transfusions of blood products, admission to hospital for various reasons, and risks of death.   The patient is aware that the response rates discussed earlier is not guaranteed.    After a long discussion, patient made  an informed decision to proceed with the prescribed plan of care.   Due  to her previous documented progression on rituximab, I do not plan to repeat treatment with rituximab again

## 2019-07-26 ENCOUNTER — Telehealth: Payer: Self-pay

## 2019-07-26 NOTE — Telephone Encounter (Signed)
Oral Oncology Patient Advocate Encounter  Received notification from [facility] [employer] that prior authorization for Eunice Blase is required.  PA submitted on CoverMyMeds Key [id number] Status is pending  Oral Oncology Clinic will continue to follow.  [place] Patient [place] Phone [phone] Fax [phone] 07/26/2019    3:49 PM

## 2019-07-27 ENCOUNTER — Telehealth: Payer: Self-pay | Admitting: Pharmacist

## 2019-07-27 DIAGNOSIS — C9110 Chronic lymphocytic leukemia of B-cell type not having achieved remission: Secondary | ICD-10-CM

## 2019-07-27 MED ORDER — COPIKTRA 25 MG PO CAPS: 25.0000 mg | ORAL_CAPSULE | Freq: Two times a day (BID) | ORAL | 11 refills | Status: DC

## 2019-07-27 NOTE — Telephone Encounter (Signed)
Oral Oncology Pharmacist Encounter  Received new prescription for Copiktra (duvelisib) for the treatment of heavily pretreated chronic lymphocytic leukemia, planned duration until disease progression or unacceptable.  Duvelisib is planned to be administered at 25 mg by mouth twice daily  Labs from 07/25/19 assessed, OK for treatment initiation. Noted ANC=1.0 and pltc=133k, will continue to be monitored  Current medication list in Epic reviewed, moderate DDI with duvelisib and apixaban identified:  category C interaction: duvelisib is a moderate inhibitor of CYP3A4 leading to possible decreased metabolism and increased systemic exposure to patient's apixaban. Patient will be counseled to report additional bruising or any signs of significant bleeding.  Prescription has been e-scribed to the [facility] for benefits analysis and approval.  Oral Oncology Clinic will continue to follow for insurance authorization, copayment issues, initial counseling and start date.  Johny Drilling, PharmD, BCPS, BCOP  07/27/2019 3:03 PM Oral Oncology Clinic [phone]

## 2019-07-27 NOTE — Telephone Encounter (Signed)
Oral Oncology Pharmacist Encounter  Received notification that patient's insurance has denied coverage of idelalisib as their preferred PI3K inhibitor for CLL is duvelisib (Cpiktra). Discussed with MD. OK to proceed with duvelisib instead. Idelalisib referral will be cancelled and and duvelisib referral will be documented in a separate encounter.  Johny Drilling, PharmD, BCPS, BCOP  07/27/2019 3:00 PM Oral Oncology Clinic [phone]

## 2019-07-28 ENCOUNTER — Telehealth: Payer: Self-pay

## 2019-07-28 NOTE — Telephone Encounter (Signed)
Oral Oncology Patient Advocate Encounter  PA denied stating Copiktra is the preferred drug.  Copiktra PA in separate encounter.  [place] Patient [place] Phone [phone] Fax [phone] 07/28/2019   8:29 AM

## 2019-07-28 NOTE — Telephone Encounter (Signed)
Oral Oncology Patient Advocate Encounter  Received notification from [employer] [employer] that prior authorization for Copiktra is required.  PA submitted on CoverMyMeds Key [id number] Status is pending  Oral Oncology Clinic will continue to follow.  [place] Patient [place] Phone [phone] Fax [phone] 07/28/2019    8:31 AM

## 2019-08-02 NOTE — Telephone Encounter (Signed)
Oral Oncology Pharmacist Encounter  Insurance authorization of Copiktra (duvelisib) has been denied stating Zydelig (idelalisib) is the preferred agent in this class for patient's diagnosis.  We contacted [facility] [employer] on 07/29/19 to inquire about insurance denial, as insurance authorization for [employer] (idelalisib) has been denied on 07/28/19 stating the preferred agent agent is Copiktra (duvelisib). We were informed this was an authorization mistake and that the medication algorithms would need to be updated. Although that would take some time. In the meantime, the office should submit an appeal for whichever agent was our preferred, and this appeal would overturn the insurance denial, regardless of whether we chose to appeal for Copiktra or Zydelig.  Discussed above with MD. Lavena Bullion is preferred agent.  Urgent insurance authorization appeal for Copiktra (duvelisib) was faxed to [facility] [employer] department with letter of medical necessity.  This encounter will continue to be updated until final determination.  Johny Drilling, PharmD, BCPS, BCOP  08/02/2019 1:01 PM Oral Oncology Clinic [phone]

## 2019-08-04 ENCOUNTER — Telehealth: Payer: Self-pay | Admitting: Hematology and Oncology

## 2019-08-04 ENCOUNTER — Telehealth: Payer: Self-pay

## 2019-08-04 ENCOUNTER — Other Ambulatory Visit: Payer: Self-pay | Admitting: Cardiology

## 2019-08-04 MED ORDER — SACUBITRIL-VALSARTAN 24-26 MG PO TABS: 1.0000 | ORAL_TABLET | Freq: Two times a day (BID) | ORAL | 0 refills | Status: DC

## 2019-08-04 NOTE — Telephone Encounter (Signed)
She called to check on Copiktra Rx. Given oral chemo phone # to call. Told her per last note they are working on getting Rx.

## 2019-08-04 NOTE — Telephone Encounter (Signed)
Returned patient's phone call regarding upcoming appointment, patient is aware of the appointment.

## 2019-08-04 NOTE — Telephone Encounter (Signed)
She called and left a message to call her. Called back and left a message for her to call the office. 

## 2019-08-04 NOTE — Telephone Encounter (Signed)
Pt's medication was sent to pt's pharmacy as requested. Confirmation received.  °

## 2019-08-04 NOTE — Telephone Encounter (Signed)
Oral Oncology [place] Advocate Encounter  I called [employer] to follow up on the appeal for Copiktra.  They did receive the appeal and it is still pending.  Audrey Gross [place] Phone [phone] Fax [phone] 08/04/2019   2:14 PM

## 2019-08-04 NOTE — Telephone Encounter (Signed)
Oral Oncology Pharmacist Encounter  Received call from patient requesting an update on her new medication status. Patient informed about insurance denials and appeal process currently underway for Copiktra. We discussed risk of diarrhea, infection, GI perforations, hepatotoxicity, and pulmonary adverse events that are associated with both idelalisib and duvelisib. Patient informed that we would take appropriate steps for antimicrobial and antidiarrheal prophylaxis.  Her labs would also be monitored every 2 weeks for the first couple months of therapy.  Patient expressed multiple concerns about available treatment options. She had multiple questions about risks versus benefits of treatment options and stated that she needed some time to process information she received at her last office visit but now is in need of some more information. Patient informed that Dr. Alvy Bimler as the best source of this information.  Dr. Alvy Bimler updated about today's conversation. She will reach out to patient to ensure that all questions are answered.  We will continue to follow-up insurance denial and keep MD and patient posted.  Johny Drilling, PharmD, BCPS, BCOP  08/04/2019 11:49 AM Oral Oncology Clinic [phone]

## 2019-08-04 NOTE — Telephone Encounter (Signed)
Called and offered appt with Dr. Alvy Bimler. Scheduled appt for Monday at 10:45.

## 2019-08-05 ENCOUNTER — Other Ambulatory Visit: Payer: Self-pay | Admitting: Cardiology

## 2019-08-05 NOTE — Telephone Encounter (Signed)
Oral Oncology Pharmacist Encounter  I called the PA department at [place] at [phone] to follow-up on insurance appeal for Copiktra.  Appeal is still in process. Fax cover sheet sent with original appeal request was NOT previously marked as urgent, therefore, the plan has 15 days to return a decision.  I spoke with insurance authorization pharmacist and explained the situation. She contacted an appeal pharmacist and discussed the different options that we have to overturn the denial.  Criterias for Zydelig and Copiktra have not yet been updated. Best course of action is continue with appeal process that has already started.  Appeal is now marked as urgent. I provided my personal cell phone number for the plan to contact me this weekend if further information is required before denying the appeal.  Insurance authorization pharmacist will contact criteria department to ensure the criteria for these medications are being updated.  We should have a determination on this appeal in 72 hours. It is possible it will deny and we will have to appeal for the Zydelig.  This encounter will continue to be updated until final determination.  Johny Drilling, PharmD, BCPS, BCOP  08/05/2019 3:24 PM Oral Oncology Clinic [phone]

## 2019-08-07 NOTE — Telephone Encounter (Signed)
Oral Oncology Pharmacist Encounter  Received call from appeals department MD performing independent review of appeal for Copiktra (duvelisib). We discussed history of this insurance authorization appeal process including idelalisib denial, stating duvelisib is the preferred formulary agent, and duvelisib denial, stating patient must try and fail ibrutinib, venetoclax, idelalisib, and copanlisib in order to approve duvelisib. We discussed disease and treatment history of the patient. In order for appeal MD to recommend for duvelisib approval, patient must show intolerance or inappropriateness of 2 of the plan's preferred agents of ibrutinib, idelalisib, and copanlisib.  Patient has progressed through ibrutinib.  Copanlisib is inappropriate due to infusion dosing schedule in COVID-19 pandemic. Appeal MD stated that he would recommend approval of duvelisib based on this conversation. Final determination will be delivered by plan.  This encounter will continue to be updated until final determination.  Johny Drilling, PharmD, BCPS, BCOP  08/07/2019 10:12 AM Oral Oncology Clinic [phone]

## 2019-08-08 ENCOUNTER — Telehealth: Payer: Self-pay | Admitting: Hematology and Oncology

## 2019-08-08 ENCOUNTER — Other Ambulatory Visit: Payer: Self-pay

## 2019-08-08 ENCOUNTER — Inpatient Hospital Stay: Payer: BC Managed Care – PPO | Attending: Hematology and Oncology | Admitting: Hematology and Oncology

## 2019-08-08 ENCOUNTER — Encounter: Payer: Self-pay | Admitting: Hematology and Oncology

## 2019-08-08 VITALS — BP 138/78 | HR 106 | Temp 97.8°F | Resp 18 | Ht 67.0 in | Wt 199.4 lb

## 2019-08-08 DIAGNOSIS — C9110 Chronic lymphocytic leukemia of B-cell type not having achieved remission: Secondary | ICD-10-CM | POA: Diagnosis not present

## 2019-08-08 DIAGNOSIS — I2510 Atherosclerotic heart disease of native coronary artery without angina pectoris: Secondary | ICD-10-CM | POA: Diagnosis not present

## 2019-08-08 DIAGNOSIS — Z79899 Other long term (current) drug therapy: Secondary | ICD-10-CM | POA: Insufficient documentation

## 2019-08-08 DIAGNOSIS — Z7189 Other specified counseling: Secondary | ICD-10-CM | POA: Diagnosis not present

## 2019-08-08 DIAGNOSIS — I428 Other cardiomyopathies: Secondary | ICD-10-CM | POA: Insufficient documentation

## 2019-08-08 DIAGNOSIS — I119 Hypertensive heart disease without heart failure: Secondary | ICD-10-CM | POA: Diagnosis not present

## 2019-08-08 DIAGNOSIS — C9112 Chronic lymphocytic leukemia of B-cell type in relapse: Secondary | ICD-10-CM | POA: Diagnosis not present

## 2019-08-08 DIAGNOSIS — Z9221 Personal history of antineoplastic chemotherapy: Secondary | ICD-10-CM | POA: Insufficient documentation

## 2019-08-08 DIAGNOSIS — R590 Localized enlarged lymph nodes: Secondary | ICD-10-CM | POA: Insufficient documentation

## 2019-08-08 MED ORDER — PREDNISONE 20 MG PO TABS: 20.0000 mg | ORAL_TABLET | Freq: Every day | ORAL | 0 refills | Status: DC

## 2019-08-08 NOTE — Telephone Encounter (Signed)
Scheduled appt per 11/2 sch message - unable to reach pt . Left message with appt date and time   

## 2019-08-08 NOTE — Assessment & Plan Note (Signed)
She has expressed significant concerns about toxicity of treatment We weigh in the risk and benefits of each option Discussed prognosis and survival with or without treatment

## 2019-08-08 NOTE — Progress Notes (Signed)
[place] OFFICE PROGRESS NOTE  Patient Care Team: Janith Lima, MD as PCP - General (Internal Medicine) Constance Haw, MD as PCP - Electrophysiology (Cardiology) Constance Haw, MD as Consulting Physician (Cardiology)  ASSESSMENT & PLAN:  CLL (chronic lymphocytic leukemia) ([place]) She have signs of disease progression with swollen lymphadenopathy We were able to get her prescription approved and she will start her chemotherapy soon In the meantime, recommend taking prednisone daily I will see her again next week for further follow-up We have extensive discussion about the risks, benefits, side effects of current treatment versus chemotherapy such as fludarabine, Cytoxan/rituximab or R-CHOP chemotherapy Ultimately, she agreed to proceed with the plan of care  Goals of care, counseling/discussion She has expressed significant concerns about toxicity of treatment We weigh in the risk and benefits of each option Discussed prognosis and survival with or without treatment   No orders of the defined types were placed in this encounter.   INTERVAL HISTORY: Please see below for problem oriented charting. She is seen for further follow-up I also spoke with her husband over the telephone We had issues getting her medications approved but insurance approval was finally obtained today She has noted new progressive lymphadenopathy in her neck and axilla since discontinuation of aclarabrutinib  SUMMARY OF ONCOLOGIC HISTORY: Oncology History Overview Note  17p positive   CLL (chronic lymphocytic leukemia) ([place])  04/05/2015 Pathology Results   Accession: [id number] flow cytometry confirmed CLL. FISH was positive for p53 mutation   04/24/2015 Imaging   Extensive lymphadenopathy throughout the neck, chest (axilla), abdomen and pelvis, as detailed above, compatible with the reported clinical history of lymphoma. 2. Mild splenomegaly.   05/03/2015 - 08/27/2015  Chemotherapy   She started on Ibrutinib, discontinued prematurely when her prescription ran out   10/10/2015 - 05/29/2017 Chemotherapy   She was restarted back on Ibrutinib   11/13/2016 PET scan   Significant generalized reduction in size of numerous lymph nodes in the neck, chest, abdomen, and pelvis. Previously the activity of these nodes was low-level and in general a similar low-level activity is present today, significantly less than mediastinal blood pool activity, compatible with Deauville 2. 2. Coronary atherosclerosis. Mild cardiomegaly. 3. Mildly prominent endometrium for age without accentuated metabolic activity in the endometrium. Consider pelvic sonography for further characterization.   05/27/2017 PET scan   1. Progressive hypermetabolic adenopathy, primarily involving cervical, axillary, pelvic and inguinal lymph nodes bilaterally, consistent with progressive lymphoma. 2. No solid visceral organ or osseous involvement.   06/16/2017 - 08/25/2017 Chemotherapy   The patient had 3 cycles of Rituximab and Bendamustine   07/15/2017 - 07/19/2017 Hospital Admission   The patient was briefly admitted to the hospital due to infusion reaction to rituximab   09/18/2017 PET scan   1. Continued considerable adenopathy in the neck, chest, abdomen, and pelvis. This is generally stable in size but mildly reduced in activity compared to the prior exam. Current levels of activity primarily Deauville 3 and Deauville 4. No splenomegaly. 2. Diffuse new ground-glass opacities in the lungs with associated hypermetabolic activity. Some forms of lymphoma infiltration can rarely cause this pattern of diffuse ground-glass opacity and hypermetabolic activity. Differential diagnostic considerations might include atypical pneumonia such as mycoplasma, acute hypersensitivity pneumonitis, or acute eosinophilic pneumonia. Pulmonary hemorrhage seems less likely to cause this degree of accentuated metabolic activity.   3. Aortic Atherosclerosis (ICD10-I70.0). Coronary atherosclerosis.   09/24/2017 -  Chemotherapy   The patient had ventoclax for chemotherapy treatment.  Rituximab is added on 11/18/17 to 04/09/18, x 6 cycles   11/19/2017 PET scan   Overall mild interval decrease in hypermetabolic lymphadenopathy throughout the neck, chest, abdomen, and pelvis. No new or increased lymphadenopathy identified.  While there has been resolution of diffuse hypermetabolic bilateral ground-glass pulmonary opacity since prior study, there is a new 14 mm hypermetabolic pulmonary nodule in the posterior right lower lobe. Time course favors inflammatory or infectious etiology over neoplasm. Recommend continued follow-up by chest CT in 3 months.   02/16/2018 PET scan   1. Adenopathy in the neck, chest, and pelvis is stable to minimally reduced in size, and is moderately reduced in activity, primarily Deauville 2 disease today. There is a right common iliac lymph node qualifying as Deauville 3 disease which is stable in size but reduced in activity. 2. Stable size but reduced activity in a pulmonary nodule in the right lower lobe. If this represents a leukemic lesion then a corresponds to Deauville 4 disease. This lesion was not readily apparent on 09/22/2017 but was reported on the prior chest CT of 11/19/2017. 3. Other imaging findings of potential clinical significance: Mild thyroid goiter. Aortic Atherosclerosis (ICD10-I70.0). Coronary atherosclerosis. Mild cardiomegaly.   04/19/2018 PET scan   1. Interval mild mixed metabolic changes, as detailed. Persistent mildly hypermetabolic bilateral axillary, mediastinal and bilateral pelvic adenopathy and mildly hypermetabolic right lower lobe pulmonary nodule compatible with lymphoproliferative disorder. Deauville 4 based on the subcarinal node. 2. Aortic Atherosclerosis (ICD10-I70.0).   06/29/2018 Procedure   Successful placement of a right internal jugular approach power  injectable Port-A-Cath. The catheter is ready for immediate use.   07/26/2018 PET scan   1. Adenopathy primarily in the chest and pelvis as noted above, with size of the mildly enlarged lymph nodes stable to minimally increased, but with nodal activity generally decreased compared to the prior exam. Uptake in these nodes is primarily Deauville 2. 2. Aortic Atherosclerosis (ICD10-I70.0). Coronary atherosclerosis with mild cardiomegaly.   10/29/2018 PET scan   1. Relatively stable sized lymphadenopathy but interval increase in hypermetabolism when compared to the most recent prior PET-CT, as detailed above. 2. No new disease is identified. 3. Stable bilateral pulmonary nodules.   11/12/2018 - 02/02/2019 Chemotherapy   The patient had riTUXimab (RITUXAN) 800 mg in sodium chloride 0.9 % 170 mL infusion, 375 mg/m2 = 800 mg, Intravenous,  Once, 3 of 4 cycles Administration: 800 mg (11/12/2018), 800 mg (12/10/2018), 800 mg (01/06/2019)  for chemotherapy treatment.    01/11/2019 Imaging   1. Significant increased adenopathy in the chest, abdomen, and pelvis compared to the 10/28/2018 PET-CT. 2. The scattered tiny pulmonary nodules and larger single right lower lobe pulmonary nodule are all stable. 3. Other imaging findings of potential clinical significance: Aortic Atherosclerosis (ICD10-I70.0). Coronary atherosclerosis. Mild cardiomegaly. Mild diffuse thyroid prominence, stable. Mild left foraminal impingement at L4-5.   01/21/2019 Procedure   Successful ultrasound-guided core biopsies of an enlarged right axillary lymph node.    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 The latter is  important because it essentially rules out the possibility of mantle cell lymphoma. CD3 highlights a small population of T-cells. By flow cytometry, a kappa-restricted monoclonal B-cell population that expresses CD19, CD20, CD5, and CD23 comprises 89% of all  lymphocytes. Overall, the features are consistent with chronic lymphocytic leukemia/small lymphocytic lymphoma.   01/31/2019 Echocardiogram    1. The left ventricle has normal systolic function, with an ejection fraction of 55-60%. The cavity size was normal. Left ventricular diastolic Doppler parameters are consistent with impaired relaxation. No evidence of left ventricular regional wall motion abnormalities. GLS -20%.  2. The right ventricle has normal systolic function. The cavity was normal. There is no increase in right ventricular wall thickness.  3. The aortic valve is tricuspid. Mild calcification of the aortic valve. No stenosis of the aortic valve.  4. The aortic root is normal in size and structure.  5. 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  6. No evidence of mitral valve stenosis. No significant mitral regurgitation.  7. Normal IVC size. No complete TR doppler jet so unable to estimate PA systolic pressure.   02/03/2019 - 07/24/2019 Chemotherapy   The patient had obinutuzumab and Acalabrutinib for chemotherapy treatment.    05/02/2019 Imaging   1. Interval decrease in size axillary and mediastinal adenopathy. 2. There are a few retroperitoneal lymph nodes which are mildly increased in size. The majority of the retroperitoneal and pelvic lymph nodes are grossly similar when compared to prior exam. 3. Stable scattered pulmonary nodules.   07/22/2019 Imaging   1. Progressive disease within the neck, chest, abdomen, and pelvis, as evidenced by increase in adenopathy. 2. Similar bilateral pulmonary nodules. 3. Extensive right-sided colonic pneumatosis and small volume extracolonic intraperitoneal gas. Correlate  with abdominal symptoms. Of note, pneumatosis has been described in patients on immunotherapy. 4. Age advanced coronary artery atherosclerosis. Recommend assessment of coronary risk factors and consideration of medical therapy. 5.  Aortic Atherosclerosis (ICD10-I70.0). 6. Similar mild L1 compression deformity. 7. Trace free pelvic fluid, similar. 8. Pulmonary artery enlargement suggests pulmonary arterial hypertension.     REVIEW OF SYSTEMS:   Constitutional: Denies fevers, chills or abnormal weight loss Eyes: Denies blurriness of vision Ears, nose, mouth, throat, and face: Denies mucositis or sore throat Respiratory: Denies cough, dyspnea or wheezes Cardiovascular: Denies palpitation, chest discomfort or lower extremity swelling Gastrointestinal:  Denies nausea, heartburn or change in bowel habits Skin: Denies abnormal skin rashes Neurological:Denies numbness, tingling or new weaknesses Behavioral/Psych: Mood is stable, no new changes  All other systems were reviewed with the patient and are negative.  I have reviewed the past medical history, past surgical history, social history and family history with the patient and they are unchanged from previous note.  ALLERGIES:  has No Known Allergies.  MEDICATIONS:  Current Outpatient Medications  Medication Sig Dispense Refill  . allopurinol (ZYLOPRIM) 300 MG tablet TAKE 1 TABLET BY MOUTH EVERY DAY 90 tablet 1  . Cholecalciferol (VITAMIN D-3) 1000 units CAPS Take 1,000 Units by mouth daily with breakfast.    . Duvelisib (COPIKTRA) 25 MG CAPS Take 25 mg by mouth 2 (two) times daily. Take with or without food, approximately 12 hours apart. 60 capsule 11  . ELIQUIS 5 MG TABS tablet TAKE 1 TABLET BY MOUTH TWICE A DAY 180 tablet 1  . furosemide (LASIX) 20 MG tablet TAKE 1 TABLET BY MOUTH EVERY DAY AS NEEDED 90 tablet 2  . lidocaine-prilocaine (EMLA) cream Apply 1 application topically as needed. 30 g 6  . metoprolol succinate (TOPROL-XL) 100  MG 24 hr tablet TAKE 1 AND 1/2 TABLETS BY MOUTH EVERY DAY. TAKE WITH FOOD OR RIGHT AFTER A MEAL 135 tablet 2  . potassium chloride SA (K-DUR,KLOR-CON) 20  MEQ tablet Take 1 tablet (20 mEq total) by mouth daily. 30 tablet 11  . predniSONE (DELTASONE) 20 MG tablet Take 1 tablet (20 mg total) by mouth daily with breakfast. 30 tablet 0  . sacubitril-valsartan (ENTRESTO) 24-26 MG Take 1 tablet by mouth 2 (two) times daily. Please keep upcoming appt in November with Dr. Curt Bears for future refills. Thank you 180 tablet 0   No current facility-administered medications for this visit.     PHYSICAL EXAMINATION: ECOG PERFORMANCE STATUS: 1 - Symptomatic but completely ambulatory  Vitals:   08/08/19 1046  BP: 138/78  Pulse: (!) 106  Resp: 18  Temp: 97.8 F (36.6 C)  SpO2: 100%   Filed Weights   08/08/19 1046  Weight: 199 lb 6.4 oz (90.4 kg)    GENERAL:alert, no distress and comfortable SKIN: skin color, texture, turgor are normal, no rashes or significant lesions EYES: normal, Conjunctiva are pink and non-injected, sclera clear OROPHARYNX:no exudate, no erythema and lips, buccal mucosa, and tongue normal  NECK: supple, thyroid normal size, non-tender, without nodularity LYMPH: She has palpable lymphadenopathy in the neck and axillary region LUNGS: clear to auscultation and percussion with normal breathing effort HEART: regular rate & rhythm and no murmurs and no lower extremity edema ABDOMEN:abdomen soft, non-tender and normal bowel sounds Musculoskeletal:no cyanosis of digits and no clubbing  NEURO: alert & oriented x 3 with fluent speech, no focal motor/sensory deficits  LABORATORY DATA:  I have reviewed the data as listed    Component Value Date/Time   NA 144 07/25/2019 0845   NA 140 10/09/2017 1416   K 3.9 07/25/2019 0845   K 4.4 10/09/2017 1416   CL 110 07/25/2019 0845   CL 105 01/18/2013 0912   CO2 24 07/25/2019 0845   CO2 25 10/09/2017 1416   GLUCOSE 100 (H) 07/25/2019 0845    GLUCOSE 117 10/09/2017 1416   GLUCOSE 83 01/18/2013 0912   BUN 13 07/25/2019 0845   BUN 11.1 10/09/2017 1416   CREATININE 0.71 07/25/2019 0845   CREATININE 0.72 11/01/2018 0903   CREATININE 0.8 10/09/2017 1416   CALCIUM 8.9 07/25/2019 0845   CALCIUM 9.6 10/09/2017 1416   PROT 6.1 (L) 07/25/2019 0845   PROT 6.4 10/09/2017 1416   ALBUMIN 3.8 07/25/2019 0845   ALBUMIN 3.4 (L) 10/09/2017 1416   AST 17 07/25/2019 0845   AST 17 11/01/2018 0903   AST 18 10/09/2017 1416   ALT 10 07/25/2019 0845   ALT 15 11/01/2018 0903   ALT 19 10/09/2017 1416   ALKPHOS 61 07/25/2019 0845   ALKPHOS 69 10/09/2017 1416   BILITOT 1.1 07/25/2019 0845   BILITOT 1.3 (H) 11/01/2018 0903   BILITOT 0.57 10/09/2017 1416   GFRNONAA >60 07/25/2019 0845   GFRNONAA >60 11/01/2018 0903   GFRAA >60 07/25/2019 0845   GFRAA >60 11/01/2018 0903    No results found for: SPEP, UPEP  Lab Results  Component Value Date   WBC 3.0 (L) 07/25/2019   NEUTROABS 1.0 (L) 07/25/2019   HGB 11.3 (L) 07/25/2019   HCT 35.3 (L) 07/25/2019   MCV 92.2 07/25/2019   PLT 133 (L) 07/25/2019      Chemistry      Component Value Date/Time   NA 144 07/25/2019 0845   NA 140 10/09/2017 1416   K 3.9 07/25/2019 0845   K 4.4 10/09/2017 1416   CL 110 07/25/2019 0845   CL 105 01/18/2013 0912   CO2 24 07/25/2019 0845   CO2 25 10/09/2017 1416  BUN 13 07/25/2019 0845   BUN 11.1 10/09/2017 1416   CREATININE 0.71 07/25/2019 0845   CREATININE 0.72 11/01/2018 0903   CREATININE 0.8 10/09/2017 1416      Component Value Date/Time   CALCIUM 8.9 07/25/2019 0845   CALCIUM 9.6 10/09/2017 1416   ALKPHOS 61 07/25/2019 0845   ALKPHOS 69 10/09/2017 1416   AST 17 07/25/2019 0845   AST 17 11/01/2018 0903   AST 18 10/09/2017 1416   ALT 10 07/25/2019 0845   ALT 15 11/01/2018 0903   ALT 19 10/09/2017 1416   BILITOT 1.1 07/25/2019 0845   BILITOT 1.3 (H) 11/01/2018 0903   BILITOT 0.57 10/09/2017 1416       RADIOGRAPHIC STUDIES: I have  personally reviewed the radiological images as listed and agreed with the findings in the report. Ct Chest W Contrast  Result Date: 07/22/2019 CLINICAL DATA:  Chronic lymphocytic leukemia/lymphoma. Immunotherapy and chemotherapy ongoing. Diagnosed in 2010. EXAM: CT CHEST, ABDOMEN, AND PELVIS WITH CONTRAST TECHNIQUE: Multidetector CT imaging of the chest, abdomen and pelvis was performed following the standard protocol during bolus administration of intravenous contrast. CONTRAST:  19m OMNIPAQUE IOHEXOL 300 MG/ML  SOLN COMPARISON:  05/02/2019 FINDINGS: CT CHEST FINDINGS Cardiovascular: Right Port-A-Cath tip at high high right atrium. Tortuous thoracic aorta. Mild cardiomegaly, without pericardial effusion. Multivessel coronary artery atherosclerosis. No central pulmonary embolism, on this non-dedicated study. Pulmonary artery enlargement, outflow tract 3.2 cm. Mediastinum/Nodes: Low jugular/supraclavicular adenopathy. Index left-sided node of 11 mm on 04/02, increased from 6 mm on the prior. Bilateral axillary adenopathy. An index deep left axillary node measures 2.6 cm on 12/02 versus 1.1 cm on the prior exam (when remeasured). Index right axillary node measures 2.0 cm on 15/2 versus 1.5 cm on the prior exam (when remeasured). No mediastinal or hilar adenopathy. Lungs/Pleura: No pleural fluid. Redemonstration of bilateral pulmonary nodules. An index right lower lobe pulmonary nodule measures 1.3 x 1.2 cm on 84/4. Similar. A subpleural posterior left upper lobe 4 mm nodule on 52/4 is also not significantly changed. Index right upper lobe pulmonary nodule of 3 mm on 46/4, similar. Musculoskeletal: No acute osseous abnormality. CT ABDOMEN PELVIS FINDINGS Hepatobiliary: Normal liver. Normal gallbladder, without biliary ductal dilatation. Pancreas: Normal, without mass or ductal dilatation. Spleen: Normal in size, without focal abnormality. Adrenals/Urinary Tract: Normal adrenal glands. Normal left kidney.  Interpolar right renal lesion is similar in size to on the prior but too small to characterize. Normal urinary bladder. Stomach/Bowel: Normal stomach, without wall thickening. Right colonic pneumatosis and adjacent free intraperitoneal air, including on image 70/2 and image 73/2. Normal small bowel. Vascular/Lymphatic: Aortic and branch vessel atherosclerosis. Abdominal retroperitoneal adenopathy. Index left periaortic node measures 1.0 cm on 73/2 versus 8 mm on the prior. Small bowel mesenteric node measures 7 mm on 70/2 versus 5 mm on the prior (when remeasured). Right common iliac node measures 1.5 cm on 88/2 versus 1.0 cm on the prior (when remeasured). Left external iliac node measures 1.8 cm on 104/2 versus 1.4 cm on the prior. Reproductive: Normal uterus and adnexa. Other: Trace pelvic fluid is similar. Musculoskeletal: Trace L4-5 anterolisthesis. Mild superior endplate compression deformity at L1 is not significantly changed. IMPRESSION: 1. Progressive disease within the neck, chest, abdomen, and pelvis, as evidenced by increase in adenopathy. 2. Similar bilateral pulmonary nodules. 3. Extensive right-sided colonic pneumatosis and small volume extracolonic intraperitoneal gas. Correlate with abdominal symptoms. Of note, pneumatosis has been described in patients on immunotherapy. 4. Age advanced coronary artery atherosclerosis. Recommend assessment  of coronary risk factors and consideration of medical therapy. 5.  Aortic Atherosclerosis (ICD10-I70.0). 6. Similar mild L1 compression deformity. 7. Trace free pelvic fluid, similar. 8. Pulmonary artery enlargement suggests pulmonary arterial hypertension. These results will be called to the ordering clinician or representative by the Radiologist Assistant, and communication documented in the PACS or zVision Dashboard. Electronically Signed   By: Abigail Miyamoto M.D.   On: 07/22/2019 08:24   Ct Abdomen Pelvis W Contrast  Result Date: 07/22/2019 CLINICAL DATA:   Chronic lymphocytic leukemia/lymphoma. Immunotherapy and chemotherapy ongoing. Diagnosed in 2010. EXAM: CT CHEST, ABDOMEN, AND PELVIS WITH CONTRAST TECHNIQUE: Multidetector CT imaging of the chest, abdomen and pelvis was performed following the standard protocol during bolus administration of intravenous contrast. CONTRAST:  162m OMNIPAQUE IOHEXOL 300 MG/ML  SOLN COMPARISON:  05/02/2019 FINDINGS: CT CHEST FINDINGS Cardiovascular: Right Port-A-Cath tip at high high right atrium. Tortuous thoracic aorta. Mild cardiomegaly, without pericardial effusion. Multivessel coronary artery atherosclerosis. No central pulmonary embolism, on this non-dedicated study. Pulmonary artery enlargement, outflow tract 3.2 cm. Mediastinum/Nodes: Low jugular/supraclavicular adenopathy. Index left-sided node of 11 mm on 04/02, increased from 6 mm on the prior. Bilateral axillary adenopathy. An index deep left axillary node measures 2.6 cm on 12/02 versus 1.1 cm on the prior exam (when remeasured). Index right axillary node measures 2.0 cm on 15/2 versus 1.5 cm on the prior exam (when remeasured). No mediastinal or hilar adenopathy. Lungs/Pleura: No pleural fluid. Redemonstration of bilateral pulmonary nodules. An index right lower lobe pulmonary nodule measures 1.3 x 1.2 cm on 84/4. Similar. A subpleural posterior left upper lobe 4 mm nodule on 52/4 is also not significantly changed. Index right upper lobe pulmonary nodule of 3 mm on 46/4, similar. Musculoskeletal: No acute osseous abnormality. CT ABDOMEN PELVIS FINDINGS Hepatobiliary: Normal liver. Normal gallbladder, without biliary ductal dilatation. Pancreas: Normal, without mass or ductal dilatation. Spleen: Normal in size, without focal abnormality. Adrenals/Urinary Tract: Normal adrenal glands. Normal left kidney. Interpolar right renal lesion is similar in size to on the prior but too small to characterize. Normal urinary bladder. Stomach/Bowel: Normal stomach, without wall  thickening. Right colonic pneumatosis and adjacent free intraperitoneal air, including on image 70/2 and image 73/2. Normal small bowel. Vascular/Lymphatic: Aortic and branch vessel atherosclerosis. Abdominal retroperitoneal adenopathy. Index left periaortic node measures 1.0 cm on 73/2 versus 8 mm on the prior. Small bowel mesenteric node measures 7 mm on 70/2 versus 5 mm on the prior (when remeasured). Right common iliac node measures 1.5 cm on 88/2 versus 1.0 cm on the prior (when remeasured). Left external iliac node measures 1.8 cm on 104/2 versus 1.4 cm on the prior. Reproductive: Normal uterus and adnexa. Other: Trace pelvic fluid is similar. Musculoskeletal: Trace L4-5 anterolisthesis. Mild superior endplate compression deformity at L1 is not significantly changed. IMPRESSION: 1. Progressive disease within the neck, chest, abdomen, and pelvis, as evidenced by increase in adenopathy. 2. Similar bilateral pulmonary nodules. 3. Extensive right-sided colonic pneumatosis and small volume extracolonic intraperitoneal gas. Correlate with abdominal symptoms. Of note, pneumatosis has been described in patients on immunotherapy. 4. Age advanced coronary artery atherosclerosis. Recommend assessment of coronary risk factors and consideration of medical therapy. 5.  Aortic Atherosclerosis (ICD10-I70.0). 6. Similar mild L1 compression deformity. 7. Trace free pelvic fluid, similar. 8. Pulmonary artery enlargement suggests pulmonary arterial hypertension. These results will be called to the ordering clinician or representative by the Radiologist Assistant, and communication documented in the PACS or zVision Dashboard. Electronically Signed   By: KMarylyn Ishihara  Jobe Igo M.D.   On: 07/22/2019 08:24    All questions were answered. The patient knows to call the clinic with any problems, questions or concerns. No barriers to learning was detected.  I spent 15 minutes counseling the patient face to face. The total time spent in the  appointment was 20 minutes and more than 50% was on counseling and review of test results  Heath Lark, MD 08/08/2019 11:20 AM

## 2019-08-08 NOTE — Assessment & Plan Note (Signed)
She have signs of disease progression with swollen lymphadenopathy We were able to get her prescription approved and she will start her chemotherapy soon In the meantime, recommend taking prednisone daily I will see her again next week for further follow-up We have extensive discussion about the risks, benefits, side effects of current treatment versus chemotherapy such as fludarabine, Cytoxan/rituximab or R-CHOP chemotherapy Ultimately, she agreed to proceed with the plan of care

## 2019-08-08 NOTE — Telephone Encounter (Signed)
Oral Oncology Patient Advocate Encounter  Prior Authorization Appeal for Audrey Gross has been approved.    Effective dates: 07/07/19 through 08/06/20  Oral Oncology Clinic will continue to follow.   [place] Patient Audrey Gross Phone [phone] Fax [phone] 08/08/2019    8:49 AM

## 2019-08-09 ENCOUNTER — Telehealth: Payer: Self-pay

## 2019-08-09 NOTE — Telephone Encounter (Signed)
Oral Oncology Patient Advocate Encounter  Was successful in securing patient a $8000 grant from [employer] to provide copayment coverage for Copiktra. This will keep the out of pocket expense at $0.   I have spoken with the patient..   The billing information is as follows and has been shared with [place].   Member ID: [id number] Group ID: [id number] RxBin: [id number] Dates of Eligibility: 07/10/19 through 07/08/20  Fund: [place] Patient [place] Phone [phone] Fax [phone] 08/09/2019    12:05 PM

## 2019-08-10 MED FILL — COPIKTRA 25 MG CAPS: 25 | 28 days supply | Qty: 56 | Fill #0

## 2019-08-10 NOTE — Telephone Encounter (Signed)
Oral Oncology Patient Advocate Encounter  Confirmed with [place] that Audrey Gross was shipped on 08/10/19 with a $0 copay using the [employer] grant.   [place] Patient [place] Phone [phone] Fax [phone] 08/10/2019   3:28 PM

## 2019-08-10 NOTE — Telephone Encounter (Signed)
Oral Chemotherapy Pharmacist Encounter   I spoke with patient for overview of new oral chemotherapy medication: Copiktra (duvelisib) for the treatment of heavily pretreated chronic lymphocytic leukemia, planned duration until disease progression or unacceptable toxicity.   Counseled patient on administration, dosing, side effects, monitoring, drug-food interactions, safe handling, storage, and disposal.  Patient will take Copiktra 25 mg capsules, 1 capsule by mouth 2 times daily, taken with or without food, doses separated ~12 hours. She had been taking her acalabrutinib at 9:30 am and pm, with food, and will replace the acalabrutinib with the duvelisib.  Copiktra start date: 08/12/2019  Side effects include but not limited to: diarrhea/colitis, decreased blood cell counts (neutropenia, anemia, thrombocytopenia), hepatotoxicity, serious infections (upper respiratory tract, pneumonia, lower respiratory), decreased renal function, electrolyte abnormalities (hypophosphatemia, hyponatremia, hyper/hypokalemia, hypocalcemia), changes in serum lipase or amylase, skin rash, fatigue, musculoskeletal pain, or fever.   Patient informed about rare but serious adverse event of serious pneumonitis that has occurred in patients taking Copiktra.  Patient will obtain an antidiarrheal and alert the office of 4 or more stools above baseline per day.  Reviewed with patient importance of keeping a medication schedule and plan for any missed doses.  Medication reconciliation performed and medication/allergy list updated. Patient informed about interaction with apixaban. She will notify the office for increases in bruising or new bleeding.  MD is deciding on CMV and PJP prophylaxis.  Insurance authorization for Audrey Gross has been obtained. Test claim at the pharmacy revealed copayment $75 for 1st fill of Copiktra. Oral oncology patient advocate successfully secured manufacturer copayment coupon to reduce patient's  out of pocket expense to $0.  This will ship from the [facility] on 08/10/19 to deliver to patient's home on 11/5.  Patient informed the pharmacy will reach out 5-7 days prior to needing next fill of Copiktra  to coordinate continued medication acquisition to prevent break in therapy.  All questions answered.  Audrey Gross voiced understanding and appreciation.   Patient knows to call the office with questions or concerns.  Johny Drilling, PharmD, BCPS, BCOP  08/10/2019  1:35 PM Oral Oncology Clinic [phone]

## 2019-08-11 ENCOUNTER — Telehealth: Payer: Self-pay | Admitting: Pharmacist

## 2019-08-11 DIAGNOSIS — C9110 Chronic lymphocytic leukemia of B-cell type not having achieved remission: Secondary | ICD-10-CM

## 2019-08-11 MED ORDER — VALGANCICLOVIR HCL 450 MG PO TABS: 900.0000 mg | ORAL_TABLET | Freq: Every day | ORAL | 6 refills | Status: DC

## 2019-08-11 MED ORDER — SULFAMETHOXAZOLE-TRIMETHOPRIM 400-80 MG PO TABS: 1.0000 | ORAL_TABLET | Freq: Every day | ORAL | 6 refills | Status: DC

## 2019-08-11 MED FILL — VALGANCICLOVIR 450 MG TAB: 450 | 30 days supply | Qty: 60 | Fill #0

## 2019-08-11 MED FILL — SULFAMETHOXAZOLE-TMP SS TAB: 400-80 | 30 days supply | Qty: 30 | Fill #0

## 2019-08-11 NOTE — Telephone Encounter (Signed)
Oral Oncology Pharmacist Encounter  Discussed antimicrobial prophylaxis with MD. Prescription for valganciclovir 450 mg tablets, take 2 tablets (900 mg) by mouth once daily, quantity #60, refills = 6, has been E scribed to the [facility]. Prescription for sulfamethoxazole trimethoprim 400 mg-40 mg, take 1 tablet by mouth once daily, quantity #30, refills = 6, has been E scribed to the [facility].  I called and spoke with patient. I updated her about antimicrobial prophylaxis agents. Patient informed that oral oncology patient advocate would be running test claims today and will get them set for shipment today.   She will reach out to the patient with any issues processing her prescription.  Patient states she has no further questions before getting started on duvelisib. Duvelisib a plan start date is tomorrow, 08/12/2019.  Confirmed office visits with Dr. Alvy Bimler for 08/18/2019. Patient knows to call the office with any additional questions or concerns that may occur before then.  Johny Drilling, PharmD, BCPS, BCOP  08/11/2019 9:37 AM Oral Oncology Clinic [phone]

## 2019-08-12 NOTE — Telephone Encounter (Signed)
Oral Oncology Patient Advocate Encounter  Confirmed with [place] that Valganciclovir and Bactrim was shipped on 11/5 to deliver 11/6.   [place] Patient [facility] Phone [phone] Fax [phone] 08/12/2019   8:48 AM

## 2019-08-18 ENCOUNTER — Telehealth: Payer: Self-pay | Admitting: *Deleted

## 2019-08-18 ENCOUNTER — Inpatient Hospital Stay: Payer: BC Managed Care – PPO | Admitting: Hematology and Oncology

## 2019-08-18 ENCOUNTER — Encounter: Payer: Self-pay | Admitting: Hematology and Oncology

## 2019-08-18 ENCOUNTER — Telehealth: Payer: Self-pay | Admitting: Hematology and Oncology

## 2019-08-18 ENCOUNTER — Inpatient Hospital Stay: Payer: BC Managed Care – PPO

## 2019-08-18 ENCOUNTER — Other Ambulatory Visit: Payer: Self-pay

## 2019-08-18 DIAGNOSIS — R590 Localized enlarged lymph nodes: Secondary | ICD-10-CM | POA: Diagnosis not present

## 2019-08-18 DIAGNOSIS — I10 Essential (primary) hypertension: Secondary | ICD-10-CM | POA: Diagnosis not present

## 2019-08-18 DIAGNOSIS — C9110 Chronic lymphocytic leukemia of B-cell type not having achieved remission: Secondary | ICD-10-CM

## 2019-08-18 DIAGNOSIS — I119 Hypertensive heart disease without heart failure: Secondary | ICD-10-CM | POA: Diagnosis not present

## 2019-08-18 DIAGNOSIS — I2510 Atherosclerotic heart disease of native coronary artery without angina pectoris: Secondary | ICD-10-CM | POA: Diagnosis not present

## 2019-08-18 DIAGNOSIS — Z9221 Personal history of antineoplastic chemotherapy: Secondary | ICD-10-CM | POA: Diagnosis not present

## 2019-08-18 DIAGNOSIS — I428 Other cardiomyopathies: Secondary | ICD-10-CM

## 2019-08-18 DIAGNOSIS — Z79899 Other long term (current) drug therapy: Secondary | ICD-10-CM | POA: Diagnosis not present

## 2019-08-18 DIAGNOSIS — C9112 Chronic lymphocytic leukemia of B-cell type in relapse: Secondary | ICD-10-CM | POA: Diagnosis not present

## 2019-08-18 LAB — CBC WITH DIFFERENTIAL/PLATELET
Abs Immature Granulocytes: 0.15 10*3/uL — ABNORMAL HIGH (ref 0.00–0.07)
Basophils Absolute: 0.1 10*3/uL (ref 0.0–0.1)
Basophils Relative: 1 %
Eosinophils Absolute: 0 10*3/uL (ref 0.0–0.5)
Eosinophils Relative: 0 %
HCT: 39.5 % (ref 36.0–46.0)
Hemoglobin: 12.5 g/dL (ref 12.0–15.0)
Immature Granulocytes: 2 %
Lymphocytes Relative: 57 %
Lymphs Abs: 4 10*3/uL (ref 0.7–4.0)
MCH: 29.6 pg (ref 26.0–34.0)
MCHC: 31.6 g/dL (ref 30.0–36.0)
MCV: 93.4 fL (ref 80.0–100.0)
Monocytes Absolute: 0.3 10*3/uL (ref 0.1–1.0)
Monocytes Relative: 4 %
Neutro Abs: 2.5 10*3/uL (ref 1.7–7.7)
Neutrophils Relative %: 36 %
Platelets: 221 10*3/uL (ref 150–400)
RBC: 4.23 MIL/uL (ref 3.87–5.11)
RDW: 13 % (ref 11.5–15.5)
WBC: 7 10*3/uL (ref 4.0–10.5)
nRBC: 0 % (ref 0.0–0.2)

## 2019-08-18 LAB — COMPREHENSIVE METABOLIC PANEL
ALT: 13 U/L (ref 0–44)
AST: 14 U/L — ABNORMAL LOW (ref 15–41)
Albumin: 4.1 g/dL (ref 3.5–5.0)
Alkaline Phosphatase: 58 U/L (ref 38–126)
Anion gap: 12 (ref 5–15)
BUN: 12 mg/dL (ref 6–20)
CO2: 25 mmol/L (ref 22–32)
Calcium: 9 mg/dL (ref 8.9–10.3)
Chloride: 107 mmol/L (ref 98–111)
Creatinine, Ser: 0.77 mg/dL (ref 0.44–1.00)
GFR calc Af Amer: >60 mL/min (ref >60)
GFR calc non Af Amer: >60 mL/min (ref >60)
Glucose, Bld: 105 mg/dL — ABNORMAL HIGH (ref 70–99)
Potassium: 3.6 mmol/L (ref 3.5–5.1)
Sodium: 144 mmol/L (ref 135–145)
Total Bilirubin: 0.7 mg/dL (ref 0.3–1.2)
Total Protein: 6.3 g/dL — ABNORMAL LOW (ref 6.5–8.1)

## 2019-08-18 LAB — LACTATE DEHYDROGENASE: LDH: 359 U/L — ABNORMAL HIGH (ref 98–192)

## 2019-08-18 LAB — URIC ACID: Uric Acid, Serum: 3.2 mg/dL (ref 2.5–7.1)

## 2019-08-18 NOTE — Telephone Encounter (Signed)
-----   Message from Heath Lark, MD sent at 08/18/2019 10:39 AM EST ----- Regarding: pls let her know the rest of labs including blood sugar is ok

## 2019-08-18 NOTE — Assessment & Plan Note (Signed)
So far, she tolerated chemotherapy well She has completely normal CBC and lymphadenopathy has regressed I recommend she continues the same I will start to initiate prednisone taper She will come back again for blood work in 2 weeks and I will see her back in 3 weeks for further follow-up

## 2019-08-18 NOTE — Telephone Encounter (Signed)
Telephone call to patient and advised lab results as directed below. Patient appreciated call and verbalized an understanding.

## 2019-08-18 NOTE — Assessment & Plan Note (Signed)
Her examination is benign She have no signs or symptoms of congestive heart failure She will continue medical management

## 2019-08-18 NOTE — Progress Notes (Signed)
[facility] OFFICE PROGRESS NOTE  Patient Care Team: Janith Lima, MD as PCP - General (Internal Medicine) Constance Haw, MD as PCP - Electrophysiology (Cardiology) Constance Haw, MD as Consulting Physician (Cardiology)  ASSESSMENT & PLAN:  CLL (chronic lymphocytic leukemia) ([place]) So far, she tolerated chemotherapy well She has completely normal CBC and lymphadenopathy has regressed I recommend she continues the same I will start to initiate prednisone taper She will come back again for blood work in 2 weeks and I will see her back in 3 weeks for further follow-up  Hypertension She has mild elevated blood pressure today but could be attributed to anxiety She is not symptomatic I recommend close follow-up with cardiologist and primary care doctor for medical management  Nonischemic cardiomyopathy [facility]) Her examination is benign She have no signs or symptoms of congestive heart failure She will continue medical management   No orders of the defined types were placed in this encounter.   INTERVAL HISTORY: Please see below for problem oriented charting. She returns for further follow-up She tolerated new treatment well She felt most of the palpable lymphadenopathy has resolved No recent infection, fever or chills She have occasional palpitation but denies chest pain, shortness of breath or leg swelling  SUMMARY OF ONCOLOGIC HISTORY: Oncology History Overview Note  17p positive   CLL (chronic lymphocytic leukemia) ([place])  04/05/2015 Pathology Results   Accession: [id number] flow cytometry confirmed CLL. FISH was positive for p53 mutation   04/24/2015 Imaging   Extensive lymphadenopathy throughout the neck, chest (axilla), abdomen and pelvis, as detailed above, compatible with the reported clinical history of lymphoma. 2. Mild splenomegaly.   05/03/2015 - 08/27/2015 Chemotherapy   She started on Ibrutinib, discontinued prematurely when her  prescription ran out   10/10/2015 - 05/29/2017 Chemotherapy   She was restarted back on Ibrutinib   11/13/2016 PET scan   Significant generalized reduction in size of numerous lymph nodes in the neck, chest, abdomen, and pelvis. Previously the activity of these nodes was low-level and in general a similar low-level activity is present today, significantly less than mediastinal blood pool activity, compatible with Deauville 2. 2. Coronary atherosclerosis. Mild cardiomegaly. 3. Mildly prominent endometrium for age without accentuated metabolic activity in the endometrium. Consider pelvic sonography for further characterization.   05/27/2017 PET scan   1. Progressive hypermetabolic adenopathy, primarily involving cervical, axillary, pelvic and inguinal lymph nodes bilaterally, consistent with progressive lymphoma. 2. No solid visceral organ or osseous involvement.   06/16/2017 - 08/25/2017 Chemotherapy   The patient had 3 cycles of Rituximab and Bendamustine   07/15/2017 - 07/19/2017 Hospital Admission   The patient was briefly admitted to the hospital due to infusion reaction to rituximab   09/18/2017 PET scan   1. Continued considerable adenopathy in the neck, chest, abdomen, and pelvis. This is generally stable in size but mildly reduced in activity compared to the prior exam. Current levels of activity primarily Deauville 3 and Deauville 4. No splenomegaly. 2. Diffuse new ground-glass opacities in the lungs with associated hypermetabolic activity. Some forms of lymphoma infiltration can rarely cause this pattern of diffuse ground-glass opacity and hypermetabolic activity. Differential diagnostic considerations might include atypical pneumonia such as mycoplasma, acute hypersensitivity pneumonitis, or acute eosinophilic pneumonia. Pulmonary hemorrhage seems less likely to cause this degree of accentuated metabolic activity.  3. Aortic Atherosclerosis (ICD10-I70.0). Coronary atherosclerosis.    09/24/2017 -  Chemotherapy   The patient had ventoclax for chemotherapy treatment.  Rituximab is added on  11/18/17 to 04/09/18, x 6 cycles   11/19/2017 PET scan   Overall mild interval decrease in hypermetabolic lymphadenopathy throughout the neck, chest, abdomen, and pelvis. No new or increased lymphadenopathy identified.  While there has been resolution of diffuse hypermetabolic bilateral ground-glass pulmonary opacity since prior study, there is a new 14 mm hypermetabolic pulmonary nodule in the posterior right lower lobe. Time course favors inflammatory or infectious etiology over neoplasm. Recommend continued follow-up by chest CT in 3 months.   02/16/2018 PET scan   1. Adenopathy in the neck, chest, and pelvis is stable to minimally reduced in size, and is moderately reduced in activity, primarily Deauville 2 disease today. There is a right common iliac lymph node qualifying as Deauville 3 disease which is stable in size but reduced in activity. 2. Stable size but reduced activity in a pulmonary nodule in the right lower lobe. If this represents a leukemic lesion then a corresponds to Deauville 4 disease. This lesion was not readily apparent on 09/22/2017 but was reported on the prior chest CT of 11/19/2017. 3. Other imaging findings of potential clinical significance: Mild thyroid goiter. Aortic Atherosclerosis (ICD10-I70.0). Coronary atherosclerosis. Mild cardiomegaly.   04/19/2018 PET scan   1. Interval mild mixed metabolic changes, as detailed. Persistent mildly hypermetabolic bilateral axillary, mediastinal and bilateral pelvic adenopathy and mildly hypermetabolic right lower lobe pulmonary nodule compatible with lymphoproliferative disorder. Deauville 4 based on the subcarinal node. 2. Aortic Atherosclerosis (ICD10-I70.0).   06/29/2018 Procedure   Successful placement of a right internal jugular approach power injectable Port-A-Cath. The catheter is ready for immediate use.   07/26/2018  PET scan   1. Adenopathy primarily in the chest and pelvis as noted above, with size of the mildly enlarged lymph nodes stable to minimally increased, but with nodal activity generally decreased compared to the prior exam. Uptake in these nodes is primarily Deauville 2. 2. Aortic Atherosclerosis (ICD10-I70.0). Coronary atherosclerosis with mild cardiomegaly.   10/29/2018 PET scan   1. Relatively stable sized lymphadenopathy but interval increase in hypermetabolism when compared to the most recent prior PET-CT, as detailed above. 2. No new disease is identified. 3. Stable bilateral pulmonary nodules.   11/12/2018 - 02/02/2019 Chemotherapy   The patient had riTUXimab (RITUXAN) 800 mg in sodium chloride 0.9 % 170 mL infusion, 375 mg/m2 = 800 mg, Intravenous,  Once, 3 of 4 cycles Administration: 800 mg (11/12/2018), 800 mg (12/10/2018), 800 mg (01/06/2019)  for chemotherapy treatment.    01/11/2019 Imaging   1. Significant increased adenopathy in the chest, abdomen, and pelvis compared to the 10/28/2018 PET-CT. 2. The scattered tiny pulmonary nodules and larger single right lower lobe pulmonary nodule are all stable. 3. Other imaging findings of potential clinical significance: Aortic Atherosclerosis (ICD10-I70.0). Coronary atherosclerosis. Mild cardiomegaly. Mild diffuse thyroid prominence, stable. Mild left foraminal impingement at L4-5.   01/21/2019 Procedure   Successful ultrasound-guided core biopsies of an enlarged right axillary lymph node.    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 The latter is important because it essentially rules out the possibility of mantle cell lymphoma.  CD3 highlights a small population of T-cells. By flow cytometry, a kappa-restricted monoclonal B-cell population that expresses CD19, CD20, CD5, and CD23 comprises 89% of all lymphocytes. Overall, the features  are consistent with chronic lymphocytic leukemia/small lymphocytic lymphoma.   01/31/2019 Echocardiogram    1. The left ventricle has normal systolic function, with an ejection fraction of 55-60%. The cavity size was normal. Left ventricular diastolic Doppler parameters are consistent with impaired relaxation. No evidence of left ventricular regional wall motion abnormalities. GLS -20%.  2. The right ventricle has normal systolic function. The cavity was normal. There is no increase in right ventricular wall thickness.  3. The aortic valve is tricuspid. Mild calcification of the aortic valve. No stenosis of the aortic valve.  4. The aortic root is normal in size and structure.  5. 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  6. No evidence of mitral valve stenosis. No significant mitral regurgitation.  7. Normal IVC size. No complete TR doppler jet so unable to estimate PA systolic pressure.   02/03/2019 - 07/24/2019 Chemotherapy   The patient had obinutuzumab and Acalabrutinib for chemotherapy treatment.    05/02/2019 Imaging   1. Interval decrease in size axillary and mediastinal adenopathy. 2. There are a few retroperitoneal lymph nodes which are mildly increased in size. The majority of the retroperitoneal and pelvic lymph nodes are grossly similar when compared to prior exam. 3. Stable scattered pulmonary nodules.   07/22/2019 Imaging   1. Progressive disease within the neck, chest, abdomen, and pelvis, as evidenced by increase in adenopathy. 2. Similar bilateral pulmonary nodules. 3. Extensive right-sided colonic pneumatosis and small volume extracolonic intraperitoneal gas. Correlate with abdominal symptoms. Of note, pneumatosis has been described in patients on  immunotherapy. 4. Age advanced coronary artery atherosclerosis. Recommend assessment of coronary risk factors and consideration of medical therapy. 5.  Aortic Atherosclerosis (ICD10-I70.0). 6. Similar mild L1 compression deformity. 7. Trace free pelvic fluid, similar. 8. Pulmonary artery enlargement suggests pulmonary arterial hypertension.   08/11/2019 -  Chemotherapy   The patient had duvelisib for chemotherapy treatment.       REVIEW OF SYSTEMS:   Constitutional: Denies fevers, chills or abnormal weight loss Eyes: Denies blurriness of vision Ears, nose, mouth, throat, and face: Denies mucositis or sore throat Respiratory: Denies cough, dyspnea or wheezes Cardiovascular: Denies palpitation, chest discomfort or lower extremity swelling Gastrointestinal:  Denies nausea, heartburn or change in bowel habits Skin: Denies abnormal skin rashes Lymphatics: Denies new lymphadenopathy or easy bruising Neurological:Denies numbness, tingling or new weaknesses Behavioral/Psych: Mood is stable, no new changes  All other systems were reviewed with the patient and are negative.  I have reviewed the past medical history, past surgical history, social history and family history with the patient and they are unchanged from previous note.  ALLERGIES:  has No Known Allergies.  MEDICATIONS:  Current Outpatient Medications  Medication Sig Dispense Refill  . allopurinol (ZYLOPRIM) 300 MG tablet TAKE 1 TABLET BY MOUTH EVERY DAY 90 tablet 1  . Cholecalciferol (VITAMIN D-3) 1000 units CAPS Take 1,000 Units by mouth daily with breakfast.    . Duvelisib (COPIKTRA) 25 MG CAPS Take 25 mg by mouth 2 (two) times daily. Take with or without food, approximately 12 hours apart. 60 capsule 11  . ELIQUIS 5 MG TABS tablet TAKE 1 TABLET BY MOUTH TWICE A DAY 180 tablet 1  . furosemide (LASIX) 20 MG tablet TAKE 1 TABLET BY MOUTH EVERY DAY AS NEEDED 90 tablet 2  . lidocaine-prilocaine (EMLA) cream Apply 1 application  topically as needed. 30 g 6  . metoprolol succinate (TOPROL-XL) 100 MG 24 hr tablet TAKE 1 AND 1/2 TABLETS BY MOUTH  EVERY DAY. TAKE WITH FOOD OR RIGHT AFTER A MEAL 135 tablet 2  . potassium chloride SA (K-DUR,KLOR-CON) 20 MEQ tablet Take 1 tablet (20 mEq total) by mouth daily. 30 tablet 11  . predniSONE (DELTASONE) 20 MG tablet Take 1 tablet (20 mg total) by mouth daily with breakfast. 30 tablet 0  . sacubitril-valsartan (ENTRESTO) 24-26 MG Take 1 tablet by mouth 2 (two) times daily. Please keep upcoming appt in November with Dr. Curt Bears for future refills. Thank you 180 tablet 0  . sulfamethoxazole-trimethoprim (BACTRIM) 400-80 MG tablet Take 1 tablet by mouth daily. 30 tablet 6  . valGANciclovir (VALCYTE) 450 MG tablet Take 2 tablets (900 mg total) by mouth daily. 60 tablet 6   No current facility-administered medications for this visit.     PHYSICAL EXAMINATION: ECOG PERFORMANCE STATUS: 0 - Asymptomatic  Vitals:   08/18/19 1021 08/18/19 1022  BP: (!) 156/90 140/79  Pulse: (!) 50   Resp: 18   Temp: 97.9 F (36.6 C)   SpO2: 100%    Filed Weights   08/18/19 1021  Weight: 197 lb 11.2 oz (89.7 kg)    GENERAL:alert, no distress and comfortable SKIN: skin color, texture, turgor are normal, no rashes or significant lesions EYES: normal, Conjunctiva are pink and non-injected, sclera clear OROPHARYNX:no exudate, no erythema and lips, buccal mucosa, and tongue normal  NECK: supple, thyroid normal size, non-tender, without nodularity LYMPH:  no palpable lymphadenopathy in the cervical, axillary or inguinal LUNGS: clear to auscultation and percussion with normal breathing effort HEART: regular rate & rhythm and no murmurs and no lower extremity edema ABDOMEN:abdomen soft, non-tender and normal bowel sounds Musculoskeletal:no cyanosis of digits and no clubbing  NEURO: alert & oriented x 3 with fluent speech, no focal motor/sensory deficits  LABORATORY DATA:  I have reviewed the data as  listed    Component Value Date/Time   NA 144 08/18/2019 1001   NA 140 10/09/2017 1416   K 3.6 08/18/2019 1001   K 4.4 10/09/2017 1416   CL 107 08/18/2019 1001   CL 105 01/18/2013 0912   CO2 25 08/18/2019 1001   CO2 25 10/09/2017 1416   GLUCOSE 105 (H) 08/18/2019 1001   GLUCOSE 117 10/09/2017 1416   GLUCOSE 83 01/18/2013 0912   BUN 12 08/18/2019 1001   BUN 11.1 10/09/2017 1416   CREATININE 0.77 08/18/2019 1001   CREATININE 0.72 11/01/2018 0903   CREATININE 0.8 10/09/2017 1416   CALCIUM 9.0 08/18/2019 1001   CALCIUM 9.6 10/09/2017 1416   PROT 6.3 (L) 08/18/2019 1001   PROT 6.4 10/09/2017 1416   ALBUMIN 4.1 08/18/2019 1001   ALBUMIN 3.4 (L) 10/09/2017 1416   AST 14 (L) 08/18/2019 1001   AST 17 11/01/2018 0903   AST 18 10/09/2017 1416   ALT 13 08/18/2019 1001   ALT 15 11/01/2018 0903   ALT 19 10/09/2017 1416   ALKPHOS 58 08/18/2019 1001   ALKPHOS 69 10/09/2017 1416   BILITOT 0.7 08/18/2019 1001   BILITOT 1.3 (H) 11/01/2018 0903   BILITOT 0.57 10/09/2017 1416   GFRNONAA >60 08/18/2019 1001   GFRNONAA >60 11/01/2018 0903   GFRAA >60 08/18/2019 1001   GFRAA >60 11/01/2018 0903    No results found for: SPEP, UPEP  Lab Results  Component Value Date   WBC 7.0 08/18/2019   NEUTROABS 2.5 08/18/2019   HGB 12.5 08/18/2019   HCT 39.5 08/18/2019   MCV 93.4 08/18/2019   PLT 221 08/18/2019      Chemistry  Component Value Date/Time   NA 144 08/18/2019 1001   NA 140 10/09/2017 1416   K 3.6 08/18/2019 1001   K 4.4 10/09/2017 1416   CL 107 08/18/2019 1001   CL 105 01/18/2013 0912   CO2 25 08/18/2019 1001   CO2 25 10/09/2017 1416   BUN 12 08/18/2019 1001   BUN 11.1 10/09/2017 1416   CREATININE 0.77 08/18/2019 1001   CREATININE 0.72 11/01/2018 0903   CREATININE 0.8 10/09/2017 1416      Component Value Date/Time   CALCIUM 9.0 08/18/2019 1001   CALCIUM 9.6 10/09/2017 1416   ALKPHOS 58 08/18/2019 1001   ALKPHOS 69 10/09/2017 1416   AST 14 (L) 08/18/2019 1001    AST 17 11/01/2018 0903   AST 18 10/09/2017 1416   ALT 13 08/18/2019 1001   ALT 15 11/01/2018 0903   ALT 19 10/09/2017 1416   BILITOT 0.7 08/18/2019 1001   BILITOT 1.3 (H) 11/01/2018 0903   BILITOT 0.57 10/09/2017 1416       RADIOGRAPHIC STUDIES: I have personally reviewed the radiological images as listed and agreed with the findings in the report. Ct Chest W Contrast  Result Date: 07/22/2019 CLINICAL DATA:  Chronic lymphocytic leukemia/lymphoma. Immunotherapy and chemotherapy ongoing. Diagnosed in 2010. EXAM: CT CHEST, ABDOMEN, AND PELVIS WITH CONTRAST TECHNIQUE: Multidetector CT imaging of the chest, abdomen and pelvis was performed following the standard protocol during bolus administration of intravenous contrast. CONTRAST:  165m OMNIPAQUE IOHEXOL 300 MG/ML  SOLN COMPARISON:  05/02/2019 FINDINGS: CT CHEST FINDINGS Cardiovascular: Right Port-A-Cath tip at high high right atrium. Tortuous thoracic aorta. Mild cardiomegaly, without pericardial effusion. Multivessel coronary artery atherosclerosis. No central pulmonary embolism, on this non-dedicated study. Pulmonary artery enlargement, outflow tract 3.2 cm. Mediastinum/Nodes: Low jugular/supraclavicular adenopathy. Index left-sided node of 11 mm on 04/02, increased from 6 mm on the prior. Bilateral axillary adenopathy. An index deep left axillary node measures 2.6 cm on 12/02 versus 1.1 cm on the prior exam (when remeasured). Index right axillary node measures 2.0 cm on 15/2 versus 1.5 cm on the prior exam (when remeasured). No mediastinal or hilar adenopathy. Lungs/Pleura: No pleural fluid. Redemonstration of bilateral pulmonary nodules. An index right lower lobe pulmonary nodule measures 1.3 x 1.2 cm on 84/4. Similar. A subpleural posterior left upper lobe 4 mm nodule on 52/4 is also not significantly changed. Index right upper lobe pulmonary nodule of 3 mm on 46/4, similar. Musculoskeletal: No acute osseous abnormality. CT ABDOMEN PELVIS  FINDINGS Hepatobiliary: Normal liver. Normal gallbladder, without biliary ductal dilatation. Pancreas: Normal, without mass or ductal dilatation. Spleen: Normal in size, without focal abnormality. Adrenals/Urinary Tract: Normal adrenal glands. Normal left kidney. Interpolar right renal lesion is similar in size to on the prior but too small to characterize. Normal urinary bladder. Stomach/Bowel: Normal stomach, without wall thickening. Right colonic pneumatosis and adjacent free intraperitoneal air, including on image 70/2 and image 73/2. Normal small bowel. Vascular/Lymphatic: Aortic and branch vessel atherosclerosis. Abdominal retroperitoneal adenopathy. Index left periaortic node measures 1.0 cm on 73/2 versus 8 mm on the prior. Small bowel mesenteric node measures 7 mm on 70/2 versus 5 mm on the prior (when remeasured). Right common iliac node measures 1.5 cm on 88/2 versus 1.0 cm on the prior (when remeasured). Left external iliac node measures 1.8 cm on 104/2 versus 1.4 cm on the prior. Reproductive: Normal uterus and adnexa. Other: Trace pelvic fluid is similar. Musculoskeletal: Trace L4-5 anterolisthesis. Mild superior endplate compression deformity at L1 is not significantly changed. IMPRESSION:  1. Progressive disease within the neck, chest, abdomen, and pelvis, as evidenced by increase in adenopathy. 2. Similar bilateral pulmonary nodules. 3. Extensive right-sided colonic pneumatosis and small volume extracolonic intraperitoneal gas. Correlate with abdominal symptoms. Of note, pneumatosis has been described in patients on immunotherapy. 4. Age advanced coronary artery atherosclerosis. Recommend assessment of coronary risk factors and consideration of medical therapy. 5.  Aortic Atherosclerosis (ICD10-I70.0). 6. Similar mild L1 compression deformity. 7. Trace free pelvic fluid, similar. 8. Pulmonary artery enlargement suggests pulmonary arterial hypertension. These results will be called to the ordering  clinician or representative by the Radiologist Assistant, and communication documented in the PACS or zVision Dashboard. Electronically Signed   By: Abigail Miyamoto M.D.   On: 07/22/2019 08:24   Ct Abdomen Pelvis W Contrast  Result Date: 07/22/2019 CLINICAL DATA:  Chronic lymphocytic leukemia/lymphoma. Immunotherapy and chemotherapy ongoing. Diagnosed in 2010. EXAM: CT CHEST, ABDOMEN, AND PELVIS WITH CONTRAST TECHNIQUE: Multidetector CT imaging of the chest, abdomen and pelvis was performed following the standard protocol during bolus administration of intravenous contrast. CONTRAST:  187m OMNIPAQUE IOHEXOL 300 MG/ML  SOLN COMPARISON:  05/02/2019 FINDINGS: CT CHEST FINDINGS Cardiovascular: Right Port-A-Cath tip at high high right atrium. Tortuous thoracic aorta. Mild cardiomegaly, without pericardial effusion. Multivessel coronary artery atherosclerosis. No central pulmonary embolism, on this non-dedicated study. Pulmonary artery enlargement, outflow tract 3.2 cm. Mediastinum/Nodes: Low jugular/supraclavicular adenopathy. Index left-sided node of 11 mm on 04/02, increased from 6 mm on the prior. Bilateral axillary adenopathy. An index deep left axillary node measures 2.6 cm on 12/02 versus 1.1 cm on the prior exam (when remeasured). Index right axillary node measures 2.0 cm on 15/2 versus 1.5 cm on the prior exam (when remeasured). No mediastinal or hilar adenopathy. Lungs/Pleura: No pleural fluid. Redemonstration of bilateral pulmonary nodules. An index right lower lobe pulmonary nodule measures 1.3 x 1.2 cm on 84/4. Similar. A subpleural posterior left upper lobe 4 mm nodule on 52/4 is also not significantly changed. Index right upper lobe pulmonary nodule of 3 mm on 46/4, similar. Musculoskeletal: No acute osseous abnormality. CT ABDOMEN PELVIS FINDINGS Hepatobiliary: Normal liver. Normal gallbladder, without biliary ductal dilatation. Pancreas: Normal, without mass or ductal dilatation. Spleen: Normal in  size, without focal abnormality. Adrenals/Urinary Tract: Normal adrenal glands. Normal left kidney. Interpolar right renal lesion is similar in size to on the prior but too small to characterize. Normal urinary bladder. Stomach/Bowel: Normal stomach, without wall thickening. Right colonic pneumatosis and adjacent free intraperitoneal air, including on image 70/2 and image 73/2. Normal small bowel. Vascular/Lymphatic: Aortic and branch vessel atherosclerosis. Abdominal retroperitoneal adenopathy. Index left periaortic node measures 1.0 cm on 73/2 versus 8 mm on the prior. Small bowel mesenteric node measures 7 mm on 70/2 versus 5 mm on the prior (when remeasured). Right common iliac node measures 1.5 cm on 88/2 versus 1.0 cm on the prior (when remeasured). Left external iliac node measures 1.8 cm on 104/2 versus 1.4 cm on the prior. Reproductive: Normal uterus and adnexa. Other: Trace pelvic fluid is similar. Musculoskeletal: Trace L4-5 anterolisthesis. Mild superior endplate compression deformity at L1 is not significantly changed. IMPRESSION: 1. Progressive disease within the neck, chest, abdomen, and pelvis, as evidenced by increase in adenopathy. 2. Similar bilateral pulmonary nodules. 3. Extensive right-sided colonic pneumatosis and small volume extracolonic intraperitoneal gas. Correlate with abdominal symptoms. Of note, pneumatosis has been described in patients on immunotherapy. 4. Age advanced coronary artery atherosclerosis. Recommend assessment of coronary risk factors and consideration of medical therapy. 5.  Aortic Atherosclerosis (ICD10-I70.0). 6. Similar mild L1 compression deformity. 7. Trace free pelvic fluid, similar. 8. Pulmonary artery enlargement suggests pulmonary arterial hypertension. These results will be called to the ordering clinician or representative by the Radiologist Assistant, and communication documented in the PACS or zVision Dashboard. Electronically Signed   By: Abigail Miyamoto M.D.    On: 07/22/2019 08:24    All questions were answered. The patient knows to call the clinic with any problems, questions or concerns. No barriers to learning was detected.  I spent 15 minutes counseling the patient face to face. The total time spent in the appointment was 20 minutes and more than 50% was on counseling and review of test results  Heath Lark, MD 08/18/2019 11:32 AM

## 2019-08-18 NOTE — Assessment & Plan Note (Signed)
She has mild elevated blood pressure today but could be attributed to anxiety She is not symptomatic I recommend close follow-up with cardiologist and primary care doctor for medical management

## 2019-08-18 NOTE — Telephone Encounter (Signed)
Scheduled appt per 11/12 sch message - pt is aware of appt date and time.  

## 2019-08-22 ENCOUNTER — Ambulatory Visit: Payer: BC Managed Care – PPO | Admitting: Obstetrics and Gynecology

## 2019-08-22 ENCOUNTER — Other Ambulatory Visit (HOSPITAL_COMMUNITY)
Admission: RE | Admit: 2019-08-22 | Discharge: 2019-08-22 | Disposition: A | Discharge status: Home / Self Care | Payer: BC Managed Care – PPO | Source: Ambulatory Visit | Attending: Obstetrics and Gynecology | Admitting: Obstetrics and Gynecology

## 2019-08-22 ENCOUNTER — Other Ambulatory Visit: Payer: Self-pay

## 2019-08-22 ENCOUNTER — Encounter: Payer: Self-pay | Admitting: Obstetrics and Gynecology

## 2019-08-22 VITALS — BP 134/82 | HR 64 | Temp 97.9°F | Ht 67.0 in | Wt 198.4 lb

## 2019-08-22 DIAGNOSIS — Z124 Encounter for screening for malignant neoplasm of cervix: Secondary | ICD-10-CM | POA: Diagnosis not present

## 2019-08-22 DIAGNOSIS — C9110 Chronic lymphocytic leukemia of B-cell type not having achieved remission: Secondary | ICD-10-CM

## 2019-08-22 DIAGNOSIS — Z01419 Encounter for gynecological examination (general) (routine) without abnormal findings: Secondary | ICD-10-CM

## 2019-08-22 LAB — HM PAP SMEAR

## 2019-08-22 NOTE — Patient Instructions (Signed)
EXERCISE AND DIET:  We recommended that you start or continue a regular exercise program for good health. Regular exercise means any activity that makes your heart beat faster and makes you sweat.  We recommend exercising at least 30 minutes per day at least 3 days a week, preferably 4 or 5.  We also recommend a diet low in fat and sugar.  Inactivity, poor dietary choices and obesity can cause diabetes, heart attack, stroke, and kidney damage, among others.    ALCOHOL AND SMOKING:  Women should limit their alcohol intake to no more than 7 drinks/beers/glasses of wine (combined, not each!) per week. Moderation of alcohol intake to this level decreases your risk of breast cancer and liver damage. And of course, no recreational drugs are part of a healthy lifestyle.  And absolutely no smoking or even second hand smoke. Most people know smoking can cause heart and lung diseases, but did you know it also contributes to weakening of your bones? Aging of your skin?  Yellowing of your teeth and nails?  CALCIUM AND VITAMIN D:  Adequate intake of calcium and Vitamin D are recommended.  The recommendations for exact amounts of these supplements seem to change often, but generally speaking 1,200 mg of calcium (between diet and supplement) and 800 units of Vitamin D per day seems prudent. Certain women may benefit from higher intake of Vitamin D.  If you are among these women, your doctor will have told you during your visit.    PAP SMEARS:  Pap smears, to check for cervical cancer or precancers,  have traditionally been done yearly, although recent scientific advances have shown that most women can have pap smears less often.  However, every woman still should have a physical exam from her gynecologist every year. It will include a breast check, inspection of the vulva and vagina to check for abnormal growths or skin changes, a visual exam of the cervix, and then an exam to evaluate the size and shape of the uterus and  ovaries.  And after 58 years of age, a rectal exam is indicated to check for rectal cancers. We will also provide age appropriate advice regarding health maintenance, like when you should have certain vaccines, screening for sexually transmitted diseases, bone density testing, colonoscopy, mammograms, etc.   MAMMOGRAMS:  All women over 40 years old should have a yearly mammogram. Many facilities now offer a "3D" mammogram, which may cost around $50 extra out of pocket. If possible,  we recommend you accept the option to have the 3D mammogram performed.  It both reduces the number of women who will be called back for extra views which then turn out to be normal, and it is better than the routine mammogram at detecting truly abnormal areas.    COLON CANCER SCREENING: Now recommend starting at age 45. At this time colonoscopy is not covered for routine screening until 50. There are take home tests that can be done between 45-49.   COLONOSCOPY:  Colonoscopy to screen for colon cancer is recommended for all women at age 50.  We know, you hate the idea of the prep.  We agree, BUT, having colon cancer and not knowing it is worse!!  Colon cancer so often starts as a polyp that can be seen and removed at colonscopy, which can quite literally save your life!  And if your first colonoscopy is normal and you have no family history of colon cancer, most women don't have to have it again for   10 years.  Once every ten years, you can do something that may end up saving your life, right?  We will be happy to help you get it scheduled when you are ready.  Be sure to check your insurance coverage so you understand how much it will cost.  It may be covered as a preventative service at no cost, but you should check your particular policy.      Breast Self-Awareness Breast self-awareness means being familiar with how your breasts look and feel. It involves checking your breasts regularly and reporting any changes to your  health care provider. Practicing breast self-awareness is important. A change in your breasts can be a sign of a serious medical problem. Being familiar with how your breasts look and feel allows you to find any problems early, when treatment is more likely to be successful. All women should practice breast self-awareness, including women who have had breast implants. How to do a breast self-exam One way to learn what is normal for your breasts and whether your breasts are changing is to do a breast self-exam. To do a breast self-exam: Look for Changes  1. Remove all the clothing above your waist. 2. Stand in front of a mirror in a room with good lighting. 3. Put your hands on your hips. 4. Push your hands firmly downward. 5. Compare your breasts in the mirror. Look for differences between them (asymmetry), such as: ? Differences in shape. ? Differences in size. ? Puckers, dips, and bumps in one breast and not the other. 6. Look at each breast for changes in your skin, such as: ? Redness. ? Scaly areas. 7. Look for changes in your nipples, such as: ? Discharge. ? Bleeding. ? Dimpling. ? Redness. ? A change in position. Feel for Changes Carefully feel your breasts for lumps and changes. It is best to do this while lying on your back on the floor and again while sitting or standing in the shower or tub with soapy water on your skin. Feel each breast in the following way:  Place the arm on the side of the breast you are examining above your head.  Feel your breast with the other hand.  Start in the nipple area and make  inch (2 cm) overlapping circles to feel your breast. Use the pads of your three middle fingers to do this. Apply light pressure, then medium pressure, then firm pressure. The light pressure will allow you to feel the tissue closest to the skin. The medium pressure will allow you to feel the tissue that is a little deeper. The firm pressure will allow you to feel the tissue  close to the ribs.  Continue the overlapping circles, moving downward over the breast until you feel your ribs below your breast.  Move one finger-width toward the center of the body. Continue to use the  inch (2 cm) overlapping circles to feel your breast as you move slowly up toward your collarbone.  Continue the up and down exam using all three pressures until you reach your armpit.  Write Down What You Find  Write down what is normal for each breast and any changes that you find. Keep a written record with breast changes or normal findings for each breast. By writing this information down, you do not need to depend only on memory for size, tenderness, or location. Write down where you are in your menstrual cycle, if you are still menstruating. If you are having trouble noticing differences   in your breasts, do not get discouraged. With time you will become more familiar with the variations in your breasts and more comfortable with the exam. How often should I examine my breasts? Examine your breasts every month. If you are breastfeeding, the best time to examine your breasts is after a feeding or after using a breast pump. If you menstruate, the best time to examine your breasts is 5-7 days after your period is over. During your period, your breasts are lumpier, and it may be more difficult to notice changes. When should I see my health care provider? See your health care provider if you notice:  A change in shape or size of your breasts or nipples.  A change in the skin of your breast or nipples, such as a reddened or scaly area.  Unusual discharge from your nipples.  A lump or thick area that was not there before.  Pain in your breasts.  Anything that concerns you.  

## 2019-08-22 NOTE — Progress Notes (Signed)
58 y.o. [id number] Married Black or [place] American Not Hispanic or Latino female here for annual exam.  She feels fine. No vaginal bleeding. Not sexually active.   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     Patient's last menstrual period was 02/03/2017 (approximate).          Sexually active: No.  The current method of family planning is post menopausal status.    Exercising: No.  The patient does not participate in regular exercise at present. Smoker:  no  Health Maintenance: Pap:07-10-16 WNL NEG HR HPV History of abnormal Pap:no MMG:07-02-17 needs additional imaging Colonoscopy:04-07-12 Normal per patient [id number] TDaP:06-21-12 Gardasil:N/A   reports that she has never smoked. She has never used smokeless tobacco. She reports that she does not drink alcohol or use drugs. Works as a Glass blower/designer. She has 6 kids, 1 granddaughter, one on the way.   Past Medical History:  Diagnosis Date  . Atrial fibrillation ([place])   . CLL (chronic lymphoblastic leukemia)    Leukemia  . Cystic breast   . Depression   . Diffuse cystic mastopathy   . Dyslipidemia (high LDL; low HDL)   . Hypertension   . Iron deficiency anemia, unspecified   . Lymphadenopathy of head and neck 04/05/2015  . Medical non-compliance   . Mitral regurgitation 02/2017   moderate to severe  . Persistent atrial fibrillation with rapid ventricular response ([place]) 07/15/2017    Past Surgical History:  Procedure Laterality Date  . CARDIOVERSION N/A 04/02/2017   Procedure: CARDIOVERSION;  Surgeon: Dorothy Spark, MD;  Location: [facility] ENDOSCOPY;  Service: Cardiovascular;  Laterality: N/A;  . CARDIOVERSION N/A 08/19/2017   Procedure: CARDIOVERSION;  Surgeon: Larey Dresser, MD;  Location: [facility];  Service: Cardiovascular;  Laterality: N/A;  . IR IMAGING GUIDED PORT INSERTION  06/29/2018  . TUBAL LIGATION      Current Outpatient  Medications  Medication Sig Dispense Refill  . allopurinol (ZYLOPRIM) 300 MG tablet TAKE 1 TABLET BY MOUTH EVERY DAY 90 tablet 1  . Cholecalciferol (VITAMIN D-3) 1000 units CAPS Take 1,000 Units by mouth daily with breakfast.    . Duvelisib (COPIKTRA) 25 MG CAPS Take 25 mg by mouth 2 (two) times daily. Take with or without food, approximately 12 hours apart. 60 capsule 11  . ELIQUIS 5 MG TABS tablet TAKE 1 TABLET BY MOUTH TWICE A DAY 180 tablet 1  . furosemide (LASIX) 20 MG tablet TAKE 1 TABLET BY MOUTH EVERY DAY AS NEEDED 90 tablet 2  . lidocaine-prilocaine (EMLA) cream Apply 1 application topically as needed. 30 g 6  . metoprolol succinate (TOPROL-XL) 100 MG 24 hr tablet TAKE 1 AND 1/2 TABLETS BY MOUTH EVERY DAY. TAKE WITH FOOD OR RIGHT AFTER A MEAL 135 tablet 2  . potassium chloride SA (K-DUR,KLOR-CON) 20 MEQ tablet Take 1 tablet (20 mEq total) by mouth daily. 30 tablet 11  . predniSONE (DELTASONE) 20 MG tablet Take 1 tablet (20 mg total) by mouth daily with breakfast. 30 tablet 0  . sacubitril-valsartan (ENTRESTO) 24-26 MG Take 1 tablet by mouth 2 (two) times daily. Please keep upcoming appt in November with Dr. Curt Bears for future refills. Thank you 180 tablet 0  . sulfamethoxazole-trimethoprim (BACTRIM) 400-80 MG tablet Take 1 tablet by mouth daily. 30 tablet 6  . valGANciclovir (VALCYTE) 450 MG tablet Take 2 tablets (900 mg total) by mouth daily. 60 tablet 6   No current facility-administered medications for  this visit.     Family History  Problem Relation Age of Onset  . Sudden death Mother   . Kidney disease Father        H/O HD and kidney transplant  . Stomach cancer Maternal Aunt   . Stomach cancer Paternal Grandmother   . Heart disease Brother   . Heart disease Maternal Grandmother     Review of Systems  Constitutional: Negative.   HENT: Negative.   Eyes: Negative.   Respiratory: Negative.   Cardiovascular: Negative.   Gastrointestinal: Negative.   Endocrine: Negative.    Genitourinary: Negative.   Musculoskeletal: Negative.   Skin: Negative.   Allergic/Immunologic: Negative.   Neurological: Negative.   Hematological: Negative.   Psychiatric/Behavioral: Negative.     Exam:   BP 134/82 (BP Location: Right Arm, Patient Position: Sitting, Cuff Size: Normal)   Pulse 64   Temp 97.9 F (36.6 C) (Skin)   Ht 5\' 7"  (1.702 m)   Wt 198 lb 6.4 oz (90 kg)   LMP 02/03/2017 (Approximate)   BMI 31.07 kg/m   Weight change: @WEIGHTCHANGE @ Height:   Height: 5\' 7"  (170.2 cm)  Ht Readings from Last 3 Encounters:  08/22/19 5\' 7"  (1.702 m)  08/18/19 5\' 7"  (1.702 m)  08/08/19 5\' 7"  (1.702 m)    General appearance: alert, cooperative and appears stated age Head: Normocephalic, without obvious abnormality, atraumatic Neck: no adenopathy, supple, symmetrical, trachea midline and thyroid normal to inspection and palpation Lungs: clear to auscultation bilaterally Cardiovascular: regular rate and rhythm Breasts: normal appearance, no masses or tenderness Abdomen: soft, non-tender; non distended,  no masses,  no organomegaly Extremities: extremities normal, atraumatic, no cyanosis or edema Skin: Skin color, texture, turgor normal. No rashes or lesions Lymph nodes: Cervical, supraclavicular, and axillary nodes normal. No abnormal inguinal nodes palpated Neurologic: Grossly normal   Pelvic: External genitalia:  no lesions              Urethra:  normal appearing urethra with no masses, tenderness or lesions              Bartholins and Skenes: normal                 Vagina: atrophic appearing vagina with normal color and discharge, no lesions              Cervix: no lesions               Bimanual Exam:  Uterus:  normal size, contour, position, consistency, mobility, non-tender              Adnexa: no mass, fullness, tenderness               Rectovaginal: Confirms               Anus:  normal sphincter tone, no lesions  Chaperone was present for exam.  A:  Well Woman  with normal exam  H/O CLL on long term chemo  P:   Pap with hpv  Mammogram overdue, # given  Discussed breast self exam  Discussed calcium and vit D intake  Colonoscopy UTD  Labs with other providers.

## 2019-08-25 LAB — CYTOLOGY - PAP
Comment: NEGATIVE
Diagnosis: NEGATIVE
High risk HPV: NEGATIVE

## 2019-08-30 ENCOUNTER — Other Ambulatory Visit: Payer: Self-pay

## 2019-08-30 ENCOUNTER — Encounter: Payer: Self-pay | Admitting: Cardiology

## 2019-08-30 ENCOUNTER — Ambulatory Visit: Payer: BC Managed Care – PPO | Admitting: Cardiology

## 2019-08-30 VITALS — BP 138/82 | HR 50 | Ht 67.0 in | Wt 199.8 lb

## 2019-08-30 DIAGNOSIS — I4819 Other persistent atrial fibrillation: Secondary | ICD-10-CM | POA: Diagnosis not present

## 2019-08-30 MED ORDER — ENTRESTO 49-51 MG PO TABS: 1.0000 | ORAL_TABLET | Freq: Two times a day (BID) | ORAL | 3 refills | Status: DC

## 2019-08-30 NOTE — Patient Instructions (Signed)
Medication Instructions:  Your physician has recommended you make the following change in your medication:  1. INCREASE Entresto to 49/51 mg TWICE a day  * If you need a refill on your cardiac medications before your next appointment, please call your pharmacy.   Labwork: None ordered  Testing/Procedures: None ordered  Follow-Up: At [facility], you and your health needs are our priority.  As part of our continuing mission to provide you with exceptional heart care, we have created designated Provider Care Teams.  These Care Teams include your primary Cardiologist (physician) and Advanced Practice Providers (APPs -  Physician Assistants and Nurse Practitioners) who all work together to provide you with the care you need, when you need it.  You will need a follow up appointment in 1 year.  Please call our office 2 months in advance to schedule this appointment.  You may see Dr Curt Bears or one of the following Advanced Practice Providers on your designated Care Team:    Chanetta Marshall, NP  Tommye Standard, PA-C  Oda Kilts, [place]  Thank you for choosing [facility]!!   Trinidad Curet, RN [phone]  Any Other Special Instructions Will Be Listed Below (If Applicable).

## 2019-08-30 NOTE — Progress Notes (Signed)
Electrophysiology Office Note   Date:  08/30/2019   ID:  Audrey Gross, DOB 03/12/1961, MRN [id number]  PCP:  Janith Lima, MD  Cardiologist:  [place] Primary Electrophysiologist:  Dr Curt Bears    No chief complaint on file.    History of Present Illness: Audrey Gross is a 58 y.o. female who is being seen today for the evaluation of atrial fibrillaiton at the request of Janith Lima, MD. Presenting today for electrophysiology evaluation. She has a history of CLL, asymptomatic left ventricular dysfunction, and paroxysmal atrial fibrillation possibly related to a pulmonary vein trigger. She had a cardioversion for persistent atrial fibrillation on 04/02/17 with no clinical recurrence. Echocardiogram done September 2018 showed an EF of 30-35% consistent with an echo in May 2018. Left atrium is moderately dilated at 45 mm. On repeat echo 05/2018 her EF had normalized.   Today, denies symptoms of palpitations, chest pain, shortness of breath, orthopnea, PND, lower extremity edema, claudication, dizziness, presyncope, syncope, bleeding, or neurologic sequela. The patient is tolerating medications without difficulties.  She overall has been doing well.  Unfortunately, she continues to have issues with CLL.  She is currently undergoing chemotherapy.  She tells me that she had a lipid profile done at the cancer center, but it is not in the epic system.  She says that her cholesterol was significantly elevated.  Past Medical History:  Diagnosis Date  . Atrial fibrillation ([place])   . CLL (chronic lymphoblastic leukemia)    Leukemia  . Cystic breast   . Depression   . Diffuse cystic mastopathy   . Dyslipidemia (high LDL; low HDL)   . Hypertension   . Iron deficiency anemia, unspecified   . Lymphadenopathy of head and neck 04/05/2015  . Medical non-compliance   . Mitral regurgitation 02/2017   moderate to severe  . Persistent atrial fibrillation with rapid ventricular response ([place])  07/15/2017   Past Surgical History:  Procedure Laterality Date  . CARDIOVERSION N/A 04/02/2017   Procedure: CARDIOVERSION;  Surgeon: Dorothy Spark, MD;  Location: [facility] ENDOSCOPY;  Service: Cardiovascular;  Laterality: N/A;  . CARDIOVERSION N/A 08/19/2017   Procedure: CARDIOVERSION;  Surgeon: Larey Dresser, MD;  Location: [facility];  Service: Cardiovascular;  Laterality: N/A;  . IR IMAGING GUIDED PORT INSERTION  06/29/2018  . TUBAL LIGATION       Current Outpatient Medications  Medication Sig Dispense Refill  . allopurinol (ZYLOPRIM) 300 MG tablet TAKE 1 TABLET BY MOUTH EVERY DAY 90 tablet 1  . Cholecalciferol (VITAMIN D-3) 1000 units CAPS Take 1,000 Units by mouth daily with breakfast.    . Duvelisib (COPIKTRA) 25 MG CAPS Take 25 mg by mouth 2 (two) times daily. Take with or without food, approximately 12 hours apart. 60 capsule 11  . ELIQUIS 5 MG TABS tablet TAKE 1 TABLET BY MOUTH TWICE A DAY 180 tablet 1  . furosemide (LASIX) 20 MG tablet TAKE 1 TABLET BY MOUTH EVERY DAY AS NEEDED 90 tablet 2  . lidocaine-prilocaine (EMLA) cream Apply 1 application topically as needed. 30 g 6  . metoprolol succinate (TOPROL-XL) 100 MG 24 hr tablet TAKE 1 AND 1/2 TABLETS BY MOUTH EVERY DAY. TAKE WITH FOOD OR RIGHT AFTER A MEAL 135 tablet 2  . potassium chloride SA (K-DUR,KLOR-CON) 20 MEQ tablet Take 1 tablet (20 mEq total) by mouth daily. 30 tablet 11  . predniSONE (DELTASONE) 20 MG tablet Take 1 tablet (20 mg total) by mouth daily with breakfast. 30 tablet 0  .  sacubitril-valsartan (ENTRESTO) 24-26 MG Take 1 tablet by mouth 2 (two) times daily. Please keep upcoming appt in November with Dr. Curt Bears for future refills. Thank you 180 tablet 0  . sulfamethoxazole-trimethoprim (BACTRIM) 400-80 MG tablet Take 1 tablet by mouth daily. 30 tablet 6  . valGANciclovir (VALCYTE) 450 MG tablet Take 2 tablets (900 mg total) by mouth daily. 60 tablet 6   No current facility-administered medications for this  visit.     Allergies:   Patient has no known allergies.   Social History:  The patient  reports that she has never smoked. She has never used smokeless tobacco. She reports that she does not drink alcohol or use drugs.   Family History:  The patient's family history includes Heart disease in her brother and maternal grandmother; Kidney disease in her father; Stomach cancer in her maternal aunt and paternal grandmother; Sudden death in her mother.    ROS:  Please see the history of present illness.   Otherwise, review of systems is positive for none.   All other systems are reviewed and negative.   PHYSICAL EXAM: VS:  BP 138/82   Pulse (!) 50   Ht 5\' 7"  (1.702 m)   Wt 199 lb 12.8 oz (90.6 kg)   LMP 02/03/2017 (Approximate)   SpO2 99%   BMI 31.29 kg/m  , BMI Body mass index is 31.29 kg/m. GEN: Well nourished, well developed, in no acute distress  HEENT: normal  Neck: no JVD, carotid bruits, or masses Cardiac: RRR; no murmurs, rubs, or gallops,no edema  Respiratory:  clear to auscultation bilaterally, normal work of breathing GI: soft, nontender, nondistended, + BS MS: no deformity or atrophy  Skin: warm and dry Neuro:  Strength and sensation are intact Psych: euthymic mood, full affect  EKG:  EKG is ordered today. Personal review of the ekg ordered shows sinus rhythm, rate 50, LVH  Recent Labs: 08/18/2019: ALT 13; BUN 12; Creatinine, Ser 0.77; Hemoglobin 12.5; Platelets 221; Potassium 3.6; Sodium 144    Lipid Panel     Component Value Date/Time   CHOL 170 07/16/2017 0103   TRIG 100 07/16/2017 0103   HDL 31 (L) 07/16/2017 0103   CHOLHDL 5.5 07/16/2017 0103   VLDL 20 07/16/2017 0103   LDLCALC 119 (H) 07/16/2017 0103   LDLDIRECT 144.5 07/26/2013 0934     Wt Readings from Last 3 Encounters:  08/30/19 199 lb 12.8 oz (90.6 kg)  08/22/19 198 lb 6.4 oz (90 kg)  08/18/19 197 lb 11.2 oz (89.7 kg)      Other studies Reviewed: Additional studies/ records that were  reviewed today include: TTE 05/12/18 Review of the above records today demonstrates:  - Left ventricle: The cavity size was normal. There was hypertrophy   of the apex. Systolic function was normal. The estimated ejection   fraction was in the range of 50% to 55%. Wall motion was normal;   there were no regional wall motion abnormalities. Features are   consistent with a pseudonormal left ventricular filling pattern,   with concomitant abnormal relaxation and increased filling   pressure (grade 2 diastolic dysfunction). - Aortic valve: There was no regurgitation. - Ascending aorta: The ascending aorta was mildly dilated. - Mitral valve: There was mild regurgitation. - Left atrium: The atrium was moderately dilated. - Atrial septum: No defect or patent foramen ovale was identified. - Tricuspid valve: There was trivial regurgitation. - Pulmonic valve: There was trivial regurgitation.   ASSESSMENT AND PLAN:  1.  Persistent  atrial fibrillation: Currently on Eliquis.  Had a cardioversion in 2018.  Fortunately has remained in sinus rhythm.  This patients CHA2DS2-VASc Score and unadjusted Ischemic Stroke Rate (% per year) is equal to 3.2 % stroke rate/year from a score of 3  Above score calculated as 1 point each if present [CHF, HTN, DM, Vascular=MI/PAD/Aortic Plaque, Age if 65-74, or Female] Above score calculated as 2 points each if present [Age > 75, or Stroke/TIA/TE]   2. Essential hypertension: Blood pressure mildly elevated.  We Frances Ambrosino plan to increase Entresto dose today.  3. Chronic systolic heart failure: Ejection fraction 30 to 35% on echo in 2018 with a normalized ejection fraction 05/12/2018.  Currently on Entresto and Toprol-XL.  No changes.    4. Hyperlipidemia: Previously managed with diet and exercise.  According to her, she had lipids checked at the cancer doctor.  We Damiana Berrian try and get the results of those.  Current medicines are reviewed at length with the patient today.    The patient does not have concerns regarding her medicines.  The following changes were made today: Increase Entresto  Labs/ tests ordered today include:  Orders Placed This Encounter  Procedures  . EKG 12-Lead     Disposition:   FU with Tyshia Fenter 6 months  Signed, Jeramia Saleeby Meredith Leeds, MD  08/30/2019 10:50 AM     [facility] [address] [place] [address] [phone] (office) [phone] (fax)

## 2019-08-31 ENCOUNTER — Other Ambulatory Visit: Payer: Self-pay

## 2019-08-31 ENCOUNTER — Other Ambulatory Visit: Payer: Self-pay | Admitting: Hematology and Oncology

## 2019-08-31 ENCOUNTER — Inpatient Hospital Stay: Payer: BC Managed Care – PPO

## 2019-08-31 ENCOUNTER — Telehealth: Payer: Self-pay

## 2019-08-31 DIAGNOSIS — Z79899 Other long term (current) drug therapy: Secondary | ICD-10-CM | POA: Diagnosis not present

## 2019-08-31 DIAGNOSIS — I428 Other cardiomyopathies: Secondary | ICD-10-CM | POA: Diagnosis not present

## 2019-08-31 DIAGNOSIS — Z452 Encounter for adjustment and management of vascular access device: Secondary | ICD-10-CM

## 2019-08-31 DIAGNOSIS — Z95828 Presence of other vascular implants and grafts: Secondary | ICD-10-CM

## 2019-08-31 DIAGNOSIS — C9110 Chronic lymphocytic leukemia of B-cell type not having achieved remission: Secondary | ICD-10-CM

## 2019-08-31 DIAGNOSIS — R590 Localized enlarged lymph nodes: Secondary | ICD-10-CM | POA: Diagnosis not present

## 2019-08-31 DIAGNOSIS — Z9221 Personal history of antineoplastic chemotherapy: Secondary | ICD-10-CM | POA: Diagnosis not present

## 2019-08-31 DIAGNOSIS — C9112 Chronic lymphocytic leukemia of B-cell type in relapse: Secondary | ICD-10-CM | POA: Diagnosis not present

## 2019-08-31 DIAGNOSIS — I2510 Atherosclerotic heart disease of native coronary artery without angina pectoris: Secondary | ICD-10-CM | POA: Diagnosis not present

## 2019-08-31 DIAGNOSIS — I119 Hypertensive heart disease without heart failure: Secondary | ICD-10-CM | POA: Diagnosis not present

## 2019-08-31 LAB — COMPREHENSIVE METABOLIC PANEL
ALT: 14 U/L (ref 0–44)
AST: 13 U/L — ABNORMAL LOW (ref 15–41)
Albumin: 3.9 g/dL (ref 3.5–5.0)
Alkaline Phosphatase: 55 U/L (ref 38–126)
Anion gap: 10 (ref 5–15)
BUN: 12 mg/dL (ref 6–20)
CO2: 26 mmol/L (ref 22–32)
Calcium: 9 mg/dL (ref 8.9–10.3)
Chloride: 106 mmol/L (ref 98–111)
Creatinine, Ser: 0.75 mg/dL (ref 0.44–1.00)
GFR calc Af Amer: >60 mL/min (ref >60)
GFR calc non Af Amer: >60 mL/min (ref >60)
Glucose, Bld: 88 mg/dL (ref 70–99)
Potassium: 4.1 mmol/L (ref 3.5–5.1)
Sodium: 142 mmol/L (ref 135–145)
Total Bilirubin: 1.1 mg/dL (ref 0.3–1.2)
Total Protein: 6.2 g/dL — ABNORMAL LOW (ref 6.5–8.1)

## 2019-08-31 LAB — CBC WITH DIFFERENTIAL/PLATELET
Abs Immature Granulocytes: 0.05 10*3/uL (ref 0.00–0.07)
Basophils Absolute: 0 10*3/uL (ref 0.0–0.1)
Basophils Relative: 1 %
Eosinophils Absolute: 0.1 10*3/uL (ref 0.0–0.5)
Eosinophils Relative: 2 %
HCT: 37.9 % (ref 36.0–46.0)
Hemoglobin: 12 g/dL (ref 12.0–15.0)
Immature Granulocytes: 1 %
Lymphocytes Relative: 79 %
Lymphs Abs: 2.7 10*3/uL (ref 0.7–4.0)
MCH: 29.3 pg (ref 26.0–34.0)
MCHC: 31.7 g/dL (ref 30.0–36.0)
MCV: 92.4 fL (ref 80.0–100.0)
Monocytes Absolute: 0.1 10*3/uL (ref 0.1–1.0)
Monocytes Relative: 3 %
Neutro Abs: 0.5 10*3/uL — ABNORMAL LOW (ref 1.7–7.7)
Neutrophils Relative %: 14 %
Platelets: 168 10*3/uL (ref 150–400)
RBC: 4.1 MIL/uL (ref 3.87–5.11)
RDW: 13.2 % (ref 11.5–15.5)
WBC: 3.5 10*3/uL — ABNORMAL LOW (ref 4.0–10.5)
nRBC: 0 % (ref 0.0–0.2)

## 2019-08-31 LAB — URIC ACID: Uric Acid, Serum: 6.2 mg/dL (ref 2.5–7.1)

## 2019-08-31 LAB — LACTATE DEHYDROGENASE: LDH: 271 U/L — ABNORMAL HIGH (ref 98–192)

## 2019-08-31 MED ORDER — HEPARIN SOD (PORK) LOCK FLUSH 100 UNIT/ML IV SOLN
250.0000 [IU] | Freq: Once | INTRAVENOUS | Status: AC
  Administered 2019-08-31: 500 [IU]
  Filled 2019-08-31: qty 5

## 2019-08-31 MED ORDER — SODIUM CHLORIDE 0.9% FLUSH
10.0000 mL | Freq: Once | INTRAVENOUS | Status: AC
  Administered 2019-08-31: 10 mL
  Filled 2019-08-31: qty 10

## 2019-08-31 NOTE — Telephone Encounter (Signed)
-----   Message from Heath Lark, MD sent at 08/31/2019 12:25 PM EST ----- Regarding: pls call and let her know labs are ok

## 2019-08-31 NOTE — Patient Instructions (Signed)

## 2019-08-31 NOTE — Telephone Encounter (Signed)
Spoke with pt by phone and gave below msg.  

## 2019-08-31 NOTE — Telephone Encounter (Signed)
Lm with spouse for pt to return call.

## 2019-09-01 LAB — LIPID PANEL W/O CHOL/HDL RATIO
Cholesterol, Total: 226 mg/dL — ABNORMAL HIGH (ref 100–199)
HDL: 65 mg/dL (ref >39)
LDL Chol Calc (NIH): 140 mg/dL — ABNORMAL HIGH (ref 0–99)
Triglycerides: 121 mg/dL (ref 0–149)
VLDL Cholesterol Cal: 21 mg/dL (ref 5–40)

## 2019-09-05 MED FILL — COPIKTRA 25 MG CAPS: 25 | 28 days supply | Qty: 56 | Fill #1

## 2019-09-05 MED FILL — SULFAMETHOXAZOLE-TMP SS TAB: 400-80 | 30 days supply | Qty: 30 | Fill #1

## 2019-09-05 MED FILL — VALGANCICLOVIR 450 MG TAB: 450 | 30 days supply | Qty: 60 | Fill #1

## 2019-09-06 ENCOUNTER — Other Ambulatory Visit: Payer: Self-pay

## 2019-09-06 ENCOUNTER — Inpatient Hospital Stay: Payer: BC Managed Care – PPO | Attending: Hematology and Oncology | Admitting: Hematology and Oncology

## 2019-09-06 ENCOUNTER — Inpatient Hospital Stay: Payer: BC Managed Care – PPO

## 2019-09-06 DIAGNOSIS — C9110 Chronic lymphocytic leukemia of B-cell type not having achieved remission: Secondary | ICD-10-CM

## 2019-09-06 DIAGNOSIS — Z79899 Other long term (current) drug therapy: Secondary | ICD-10-CM | POA: Diagnosis not present

## 2019-09-06 DIAGNOSIS — I428 Other cardiomyopathies: Secondary | ICD-10-CM | POA: Insufficient documentation

## 2019-09-06 DIAGNOSIS — R1310 Dysphagia, unspecified: Secondary | ICD-10-CM | POA: Diagnosis not present

## 2019-09-06 DIAGNOSIS — D702 Other drug-induced agranulocytosis: Secondary | ICD-10-CM | POA: Diagnosis not present

## 2019-09-06 DIAGNOSIS — C9112 Chronic lymphocytic leukemia of B-cell type in relapse: Secondary | ICD-10-CM | POA: Insufficient documentation

## 2019-09-06 DIAGNOSIS — D61818 Other pancytopenia: Secondary | ICD-10-CM | POA: Insufficient documentation

## 2019-09-06 LAB — CBC WITH DIFFERENTIAL/PLATELET
Abs Immature Granulocytes: 0.18 10*3/uL — ABNORMAL HIGH (ref 0.00–0.07)
Basophils Absolute: 0.1 10*3/uL (ref 0.0–0.1)
Basophils Relative: 2 %
Eosinophils Absolute: 0.1 10*3/uL (ref 0.0–0.5)
Eosinophils Relative: 2 %
HCT: 41.5 % (ref 36.0–46.0)
Hemoglobin: 12.9 g/dL (ref 12.0–15.0)
Immature Granulocytes: 5 %
Lymphocytes Relative: 68 %
Lymphs Abs: 2.5 10*3/uL (ref 0.7–4.0)
MCH: 29.1 pg (ref 26.0–34.0)
MCHC: 31.1 g/dL (ref 30.0–36.0)
MCV: 93.7 fL (ref 80.0–100.0)
Monocytes Absolute: 0.2 10*3/uL (ref 0.1–1.0)
Monocytes Relative: 5 %
Neutro Abs: 0.6 10*3/uL — ABNORMAL LOW (ref 1.7–7.7)
Neutrophils Relative %: 18 %
Platelets: 186 10*3/uL (ref 150–400)
RBC: 4.43 MIL/uL (ref 3.87–5.11)
RDW: 13.2 % (ref 11.5–15.5)
WBC: 3.6 10*3/uL — ABNORMAL LOW (ref 4.0–10.5)
nRBC: 0 % (ref 0.0–0.2)

## 2019-09-06 LAB — COMPREHENSIVE METABOLIC PANEL
ALT: 15 U/L (ref 0–44)
AST: 14 U/L — ABNORMAL LOW (ref 15–41)
Albumin: 3.9 g/dL (ref 3.5–5.0)
Alkaline Phosphatase: 61 U/L (ref 38–126)
Anion gap: 10 (ref 5–15)
BUN: 11 mg/dL (ref 6–20)
CO2: 27 mmol/L (ref 22–32)
Calcium: 9.3 mg/dL (ref 8.9–10.3)
Chloride: 104 mmol/L (ref 98–111)
Creatinine, Ser: 0.82 mg/dL (ref 0.44–1.00)
GFR calc Af Amer: >60 mL/min (ref >60)
GFR calc non Af Amer: >60 mL/min (ref >60)
Glucose, Bld: 107 mg/dL — ABNORMAL HIGH (ref 70–99)
Potassium: 4.2 mmol/L (ref 3.5–5.1)
Sodium: 141 mmol/L (ref 135–145)
Total Bilirubin: 0.8 mg/dL (ref 0.3–1.2)
Total Protein: 6.4 g/dL — ABNORMAL LOW (ref 6.5–8.1)

## 2019-09-06 LAB — URIC ACID: Uric Acid, Serum: 7.1 mg/dL (ref 2.5–7.1)

## 2019-09-06 LAB — LACTATE DEHYDROGENASE: LDH: 338 U/L — ABNORMAL HIGH (ref 98–192)

## 2019-09-07 ENCOUNTER — Encounter: Payer: Self-pay | Admitting: Hematology and Oncology

## 2019-09-07 NOTE — Assessment & Plan Note (Signed)
So far, she tolerated chemotherapy well She has complete resolution of lymphocytosis and lymphadenopathy has regressed I recommend she continues the same She tolerated prednisone taper well I will see her again in 4 weeks for further toxicity review I recommend minimum 3 months of treatment before repeat imaging study

## 2019-09-07 NOTE — Progress Notes (Signed)
[facility] OFFICE PROGRESS NOTE  Patient Care Team: Janith Lima, MD as PCP - General (Internal Medicine) Constance Haw, MD as PCP - Electrophysiology (Cardiology) Constance Haw, MD as Consulting Physician (Cardiology)  ASSESSMENT & PLAN:  CLL (chronic lymphocytic leukemia) ([place]) So far, she tolerated chemotherapy well She has complete resolution of lymphocytosis and lymphadenopathy has regressed I recommend she continues the same She tolerated prednisone taper well I will see her again in 4 weeks for further toxicity review I recommend minimum 3 months of treatment before repeat imaging study  Drug-induced neutropenia ([place]) This is likely due to recent treatment. The patient denies recent history of fevers, cough, chills, diarrhea or dysuria. She is asymptomatic from the leukopenia. I will observe for now.  I will continue the chemotherapy at current dose without dosage adjustment.  If the leukopenia gets progressive worse in the future, I might have to delay her treatment or adjust the chemotherapy dose.    Nonischemic cardiomyopathy ([place]) She has no signs or symptoms of congestive heart failure   No orders of the defined types were placed in this encounter.   INTERVAL HISTORY: Please see below for problem oriented charting. She returns for CLL follow-up She tolerated treatment well so far She has no new lymphadenopathy Denies recent infection, fever or chills Denies recent cough, chest pain or shortness of breath She tolerated prednisone taper well without major withdrawal symptoms  SUMMARY OF ONCOLOGIC HISTORY: Oncology History Overview Note  17p positive   CLL (chronic lymphocytic leukemia) ([place])  04/05/2015 Pathology Results   Accession: [id number] flow cytometry confirmed CLL. FISH was positive for p53 mutation   04/24/2015 Imaging   Extensive lymphadenopathy throughout the neck, chest (axilla), abdomen and pelvis, as detailed above,  compatible with the reported clinical history of lymphoma. 2. Mild splenomegaly.   05/03/2015 - 08/27/2015 Chemotherapy   She started on Ibrutinib, discontinued prematurely when her prescription ran out   10/10/2015 - 05/29/2017 Chemotherapy   She was restarted back on Ibrutinib   11/13/2016 PET scan   Significant generalized reduction in size of numerous lymph nodes in the neck, chest, abdomen, and pelvis. Previously the activity of these nodes was low-level and in general a similar low-level activity is present today, significantly less than mediastinal blood pool activity, compatible with Deauville 2. 2. Coronary atherosclerosis. Mild cardiomegaly. 3. Mildly prominent endometrium for age without accentuated metabolic activity in the endometrium. Consider pelvic sonography for further characterization.   05/27/2017 PET scan   1. Progressive hypermetabolic adenopathy, primarily involving cervical, axillary, pelvic and inguinal lymph nodes bilaterally, consistent with progressive lymphoma. 2. No solid visceral organ or osseous involvement.   06/16/2017 - 08/25/2017 Chemotherapy   The patient had 3 cycles of Rituximab and Bendamustine   07/15/2017 - 07/19/2017 Hospital Admission   The patient was briefly admitted to the hospital due to infusion reaction to rituximab   09/18/2017 PET scan   1. Continued considerable adenopathy in the neck, chest, abdomen, and pelvis. This is generally stable in size but mildly reduced in activity compared to the prior exam. Current levels of activity primarily Deauville 3 and Deauville 4. No splenomegaly. 2. Diffuse new ground-glass opacities in the lungs with associated hypermetabolic activity. Some forms of lymphoma infiltration can rarely cause this pattern of diffuse ground-glass opacity and hypermetabolic activity. Differential diagnostic considerations might include atypical pneumonia such as mycoplasma, acute hypersensitivity pneumonitis, or acute eosinophilic  pneumonia. Pulmonary hemorrhage seems less likely to cause this degree of accentuated  metabolic activity.  3. Aortic Atherosclerosis (ICD10-I70.0). Coronary atherosclerosis.   09/24/2017 -  Chemotherapy   The patient had ventoclax for chemotherapy treatment.  Rituximab is added on 11/18/17 to 04/09/18, x 6 cycles   11/19/2017 PET scan   Overall mild interval decrease in hypermetabolic lymphadenopathy throughout the neck, chest, abdomen, and pelvis. No new or increased lymphadenopathy identified.  While there has been resolution of diffuse hypermetabolic bilateral ground-glass pulmonary opacity since prior study, there is a new 14 mm hypermetabolic pulmonary nodule in the posterior right lower lobe. Time course favors inflammatory or infectious etiology over neoplasm. Recommend continued follow-up by chest CT in 3 months.   02/16/2018 PET scan   1. Adenopathy in the neck, chest, and pelvis is stable to minimally reduced in size, and is moderately reduced in activity, primarily Deauville 2 disease today. There is a right common iliac lymph node qualifying as Deauville 3 disease which is stable in size but reduced in activity. 2. Stable size but reduced activity in a pulmonary nodule in the right lower lobe. If this represents a leukemic lesion then a corresponds to Deauville 4 disease. This lesion was not readily apparent on 09/22/2017 but was reported on the prior chest CT of 11/19/2017. 3. Other imaging findings of potential clinical significance: Mild thyroid goiter. Aortic Atherosclerosis (ICD10-I70.0). Coronary atherosclerosis. Mild cardiomegaly.   04/19/2018 PET scan   1. Interval mild mixed metabolic changes, as detailed. Persistent mildly hypermetabolic bilateral axillary, mediastinal and bilateral pelvic adenopathy and mildly hypermetabolic right lower lobe pulmonary nodule compatible with lymphoproliferative disorder. Deauville 4 based on the subcarinal node. 2. Aortic Atherosclerosis  (ICD10-I70.0).   06/29/2018 Procedure   Successful placement of a right internal jugular approach power injectable Port-A-Cath. The catheter is ready for immediate use.   07/26/2018 PET scan   1. Adenopathy primarily in the chest and pelvis as noted above, with size of the mildly enlarged lymph nodes stable to minimally increased, but with nodal activity generally decreased compared to the prior exam. Uptake in these nodes is primarily Deauville 2. 2. Aortic Atherosclerosis (ICD10-I70.0). Coronary atherosclerosis with mild cardiomegaly.   10/29/2018 PET scan   1. Relatively stable sized lymphadenopathy but interval increase in hypermetabolism when compared to the most recent prior PET-CT, as detailed above. 2. No new disease is identified. 3. Stable bilateral pulmonary nodules.   11/12/2018 - 02/02/2019 Chemotherapy   The patient had riTUXimab (RITUXAN) 800 mg in sodium chloride 0.9 % 170 mL infusion, 375 mg/m2 = 800 mg, Intravenous,  Once, 3 of 4 cycles Administration: 800 mg (11/12/2018), 800 mg (12/10/2018), 800 mg (01/06/2019)  for chemotherapy treatment.    01/11/2019 Imaging   1. Significant increased adenopathy in the chest, abdomen, and pelvis compared to the 10/28/2018 PET-CT. 2. The scattered tiny pulmonary nodules and larger single right lower lobe pulmonary nodule are all stable. 3. Other imaging findings of potential clinical significance: Aortic Atherosclerosis (ICD10-I70.0). Coronary atherosclerosis. Mild cardiomegaly. Mild diffuse thyroid prominence, stable. Mild left foraminal impingement at L4-5.   01/21/2019 Procedure   Successful ultrasound-guided core biopsies of an enlarged right axillary lymph node.    01/21/2019 Pathology Results   Lymph node, needle/core biopsy, Right Axilla - NON-HODGKIN B-CELL LYMPHOMA CONSISTENT WITH CHRONIC LYMPHOCYTIC LEUKEMIA/SMALL LYMPHOCYTIC LYMPHOMA - SEE COMMENT Microscopic Comment The biopsies are small core biopsies composed of a monotonous  population of lymphocytes which are positive for CD20 without expression of cyclin-D1. The latter is important because it essentially rules out the possibility of mantle cell lymphoma. CD3  highlights a small population of T-cells. By flow cytometry, a kappa-restricted monoclonal B-cell population that expresses CD19, CD20, CD5, and CD23 comprises 89% of all lymphocytes. Overall, the features are consistent with chronic lymphocytic leukemia/small lymphocytic lymphoma.   01/31/2019 Echocardiogram    1. The left ventricle has normal systolic function, with an ejection fraction of 55-60%. The cavity size was normal. Left ventricular diastolic Doppler parameters are consistent with impaired relaxation. No evidence of left ventricular regional wall motion abnormalities. GLS -20%.  2. The right ventricle has normal systolic function. The cavity was normal. There is no increase in right ventricular wall thickness.  3. The aortic valve is tricuspid. Mild calcification of the aortic valve. No stenosis of the aortic valve.  4. The aortic root is normal in size and structure.  5. 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  6. No evidence of mitral valve stenosis. No significant mitral regurgitation.  7. Normal IVC size. No complete TR doppler jet so unable to estimate PA systolic pressure.   02/03/2019 - 07/24/2019 Chemotherapy   The patient had obinutuzumab and Acalabrutinib for chemotherapy treatment.    05/02/2019 Imaging   1. Interval decrease in size axillary and mediastinal adenopathy. 2. There are a few retroperitoneal lymph nodes which are mildly increased in size. The majority of the retroperitoneal and pelvic lymph nodes are grossly similar when compared to prior exam. 3. Stable scattered pulmonary nodules.   07/22/2019 Imaging   1. Progressive disease within the neck, chest, abdomen, and pelvis, as evidenced by increase in adenopathy. 2. Similar bilateral pulmonary nodules. 3.  Extensive right-sided colonic pneumatosis and small volume extracolonic intraperitoneal gas. Correlate with abdominal symptoms. Of note, pneumatosis has been described in patients on immunotherapy. 4. Age advanced coronary artery atherosclerosis. Recommend assessment of coronary risk factors and consideration of medical therapy. 5.  Aortic Atherosclerosis (ICD10-I70.0). 6. Similar mild L1 compression deformity. 7. Trace free pelvic fluid, similar. 8. Pulmonary artery enlargement suggests pulmonary arterial hypertension.   08/11/2019 -  Chemotherapy   The patient had duvelisib for chemotherapy treatment.       REVIEW OF SYSTEMS:   Constitutional: Denies fevers, chills or abnormal weight loss Eyes: Denies blurriness of vision Ears, nose, mouth, throat, and face: Denies mucositis or sore throat Respiratory: Denies cough, dyspnea or wheezes Cardiovascular: Denies palpitation, chest discomfort or lower extremity swelling Gastrointestinal:  Denies nausea, heartburn or change in bowel habits Skin: Denies abnormal skin rashes Lymphatics: Denies new lymphadenopathy or easy bruising Neurological:Denies numbness, tingling or new weaknesses Behavioral/Psych: Mood is stable, no new changes  All other systems were reviewed with the patient and are negative.  I have reviewed the past medical history, past surgical history, social history and family history with the patient and they are unchanged from previous note.  ALLERGIES:  has No Known Allergies.  MEDICATIONS:  Current Outpatient Medications  Medication Sig Dispense Refill  . allopurinol (ZYLOPRIM) 300 MG tablet TAKE 1 TABLET BY MOUTH EVERY DAY 90 tablet 1  . Cholecalciferol (VITAMIN D-3) 1000 units CAPS Take 1,000 Units by mouth daily with breakfast.    . Duvelisib (COPIKTRA) 25 MG CAPS Take 25 mg by mouth 2 (two) times daily. Take with or without food, approximately 12 hours apart. 60 capsule 11  . ELIQUIS 5 MG TABS tablet TAKE 1 TABLET BY  MOUTH TWICE A DAY 180 tablet 1  . furosemide (LASIX) 20 MG tablet TAKE 1 TABLET BY MOUTH EVERY DAY AS NEEDED 90 tablet 2  .  lidocaine-prilocaine (EMLA) cream Apply 1 application topically as needed. 30 g 6  . metoprolol succinate (TOPROL-XL) 100 MG 24 hr tablet TAKE 1 AND 1/2 TABLETS BY MOUTH EVERY DAY. TAKE WITH FOOD OR RIGHT AFTER A MEAL 135 tablet 2  . potassium chloride SA (K-DUR,KLOR-CON) 20 MEQ tablet Take 1 tablet (20 mEq total) by mouth daily. 30 tablet 11  . predniSONE (DELTASONE) 20 MG tablet Take 1 tablet (20 mg total) by mouth daily with breakfast. 30 tablet 0  . sacubitril-valsartan (ENTRESTO) 49-51 MG Take 1 tablet by mouth 2 (two) times daily. 60 tablet 3  . sulfamethoxazole-trimethoprim (BACTRIM) 400-80 MG tablet Take 1 tablet by mouth daily. 30 tablet 6  . valGANciclovir (VALCYTE) 450 MG tablet Take 2 tablets (900 mg total) by mouth daily. 60 tablet 6   No current facility-administered medications for this visit.     PHYSICAL EXAMINATION: ECOG PERFORMANCE STATUS: 1 - Symptomatic but completely ambulatory  Vitals:   09/06/19 1209  BP: 140/78  Pulse: (!) 57  Resp: 17  Temp: 97.8 F (36.6 C)  SpO2: 100%   Filed Weights   09/06/19 1209  Weight: 201 lb 8 oz (91.4 kg)    GENERAL:alert, no distress and comfortable SKIN: skin color, texture, turgor are normal, no rashes or significant lesions EYES: normal, Conjunctiva are pink and non-injected, sclera clear OROPHARYNX:no exudate, no erythema and lips, buccal mucosa, and tongue normal  NECK: supple, thyroid normal size, non-tender, without nodularity LYMPH:  no palpable lymphadenopathy in the cervical, axillary or inguinal LUNGS: clear to auscultation and percussion with normal breathing effort HEART: regular rate & rhythm and no murmurs and no lower extremity edema ABDOMEN:abdomen soft, non-tender and normal bowel sounds Musculoskeletal:no cyanosis of digits and no clubbing  NEURO: alert & oriented x 3 with fluent  speech, no focal motor/sensory deficits  LABORATORY DATA:  I have reviewed the data as listed    Component Value Date/Time   NA 141 09/06/2019 1147   NA 140 10/09/2017 1416   K 4.2 09/06/2019 1147   K 4.4 10/09/2017 1416   CL 104 09/06/2019 1147   CL 105 01/18/2013 0912   CO2 27 09/06/2019 1147   CO2 25 10/09/2017 1416   GLUCOSE 107 (H) 09/06/2019 1147   GLUCOSE 117 10/09/2017 1416   GLUCOSE 83 01/18/2013 0912   BUN 11 09/06/2019 1147   BUN 11.1 10/09/2017 1416   CREATININE 0.82 09/06/2019 1147   CREATININE 0.72 11/01/2018 0903   CREATININE 0.8 10/09/2017 1416   CALCIUM 9.3 09/06/2019 1147   CALCIUM 9.6 10/09/2017 1416   PROT 6.4 (L) 09/06/2019 1147   PROT 6.4 10/09/2017 1416   ALBUMIN 3.9 09/06/2019 1147   ALBUMIN 3.4 (L) 10/09/2017 1416   AST 14 (L) 09/06/2019 1147   AST 17 11/01/2018 0903   AST 18 10/09/2017 1416   ALT 15 09/06/2019 1147   ALT 15 11/01/2018 0903   ALT 19 10/09/2017 1416   ALKPHOS 61 09/06/2019 1147   ALKPHOS 69 10/09/2017 1416   BILITOT 0.8 09/06/2019 1147   BILITOT 1.3 (H) 11/01/2018 0903   BILITOT 0.57 10/09/2017 1416   GFRNONAA >60 09/06/2019 1147   GFRNONAA >60 11/01/2018 0903   GFRAA >60 09/06/2019 1147   GFRAA >60 11/01/2018 0903    No results found for: SPEP, UPEP  Lab Results  Component Value Date   WBC 3.6 (L) 09/06/2019   NEUTROABS 0.6 (L) 09/06/2019   HGB 12.9 09/06/2019   HCT 41.5 09/06/2019   MCV 93.7  09/06/2019   PLT 186 09/06/2019      Chemistry      Component Value Date/Time   NA 141 09/06/2019 1147   NA 140 10/09/2017 1416   K 4.2 09/06/2019 1147   K 4.4 10/09/2017 1416   CL 104 09/06/2019 1147   CL 105 01/18/2013 0912   CO2 27 09/06/2019 1147   CO2 25 10/09/2017 1416   BUN 11 09/06/2019 1147   BUN 11.1 10/09/2017 1416   CREATININE 0.82 09/06/2019 1147   CREATININE 0.72 11/01/2018 0903   CREATININE 0.8 10/09/2017 1416      Component Value Date/Time   CALCIUM 9.3 09/06/2019 1147   CALCIUM 9.6 10/09/2017  1416   ALKPHOS 61 09/06/2019 1147   ALKPHOS 69 10/09/2017 1416   AST 14 (L) 09/06/2019 1147   AST 17 11/01/2018 0903   AST 18 10/09/2017 1416   ALT 15 09/06/2019 1147   ALT 15 11/01/2018 0903   ALT 19 10/09/2017 1416   BILITOT 0.8 09/06/2019 1147   BILITOT 1.3 (H) 11/01/2018 0903   BILITOT 0.57 10/09/2017 1416       All questions were answered. The patient knows to call the clinic with any problems, questions or concerns. No barriers to learning was detected.  I spent 15 minutes counseling the patient face to face. The total time spent in the appointment was 20 minutes and more than 50% was on counseling and review of test results  Heath Lark, MD 09/07/2019 9:42 AM

## 2019-09-07 NOTE — Assessment & Plan Note (Signed)
This is likely due to recent treatment. The patient denies recent history of fevers, cough, chills, diarrhea or dysuria. She is asymptomatic from the leukopenia. I will observe for now.  I will continue the chemotherapy at current dose without dosage adjustment.  If the leukopenia gets progressive worse in the future, I might have to delay her treatment or adjust the chemotherapy dose.   

## 2019-09-07 NOTE — Assessment & Plan Note (Signed)
She has no signs or symptoms of congestive heart failure

## 2019-09-08 ENCOUNTER — Telehealth: Payer: Self-pay | Admitting: Hematology and Oncology

## 2019-09-08 NOTE — Telephone Encounter (Signed)
Left message for patient re December appointment. Schedule mailed.

## 2019-09-24 ENCOUNTER — Other Ambulatory Visit: Payer: Self-pay | Admitting: Cardiology

## 2019-09-27 ENCOUNTER — Other Ambulatory Visit: Payer: Self-pay | Admitting: Hematology and Oncology

## 2019-10-03 ENCOUNTER — Inpatient Hospital Stay: Payer: BC Managed Care – PPO

## 2019-10-03 ENCOUNTER — Inpatient Hospital Stay (HOSPITAL_BASED_OUTPATIENT_CLINIC_OR_DEPARTMENT_OTHER): Payer: BC Managed Care – PPO | Admitting: Hematology and Oncology

## 2019-10-03 ENCOUNTER — Encounter: Payer: Self-pay | Admitting: Hematology and Oncology

## 2019-10-03 ENCOUNTER — Telehealth: Payer: Self-pay | Admitting: Hematology and Oncology

## 2019-10-03 ENCOUNTER — Other Ambulatory Visit: Payer: Self-pay

## 2019-10-03 VITALS — BP 118/71 | HR 69 | Temp 97.6°F | Resp 18 | Ht 67.0 in | Wt 196.0 lb

## 2019-10-03 DIAGNOSIS — R1310 Dysphagia, unspecified: Secondary | ICD-10-CM | POA: Diagnosis not present

## 2019-10-03 DIAGNOSIS — C9112 Chronic lymphocytic leukemia of B-cell type in relapse: Secondary | ICD-10-CM | POA: Diagnosis not present

## 2019-10-03 DIAGNOSIS — C9110 Chronic lymphocytic leukemia of B-cell type not having achieved remission: Secondary | ICD-10-CM

## 2019-10-03 DIAGNOSIS — I428 Other cardiomyopathies: Secondary | ICD-10-CM | POA: Diagnosis not present

## 2019-10-03 DIAGNOSIS — D702 Other drug-induced agranulocytosis: Secondary | ICD-10-CM | POA: Diagnosis not present

## 2019-10-03 DIAGNOSIS — Z79899 Other long term (current) drug therapy: Secondary | ICD-10-CM | POA: Diagnosis not present

## 2019-10-03 DIAGNOSIS — Z95828 Presence of other vascular implants and grafts: Secondary | ICD-10-CM

## 2019-10-03 DIAGNOSIS — D61818 Other pancytopenia: Secondary | ICD-10-CM | POA: Diagnosis not present

## 2019-10-03 DIAGNOSIS — Z452 Encounter for adjustment and management of vascular access device: Secondary | ICD-10-CM

## 2019-10-03 LAB — CBC WITH DIFFERENTIAL/PLATELET
Abs Immature Granulocytes: 0.22 10*3/uL — ABNORMAL HIGH (ref 0.00–0.07)
Basophils Absolute: 0.1 10*3/uL (ref 0.0–0.1)
Basophils Relative: 3 %
Eosinophils Absolute: 0.1 10*3/uL (ref 0.0–0.5)
Eosinophils Relative: 3 %
HCT: 40.7 % (ref 36.0–46.0)
Hemoglobin: 13.1 g/dL (ref 12.0–15.0)
Immature Granulocytes: 7 %
Lymphocytes Relative: 48 %
Lymphs Abs: 1.6 10*3/uL (ref 0.7–4.0)
MCH: 29.1 pg (ref 26.0–34.0)
MCHC: 32.2 g/dL (ref 30.0–36.0)
MCV: 90.4 fL (ref 80.0–100.0)
Monocytes Absolute: 0.2 10*3/uL (ref 0.1–1.0)
Monocytes Relative: 7 %
Neutro Abs: 1.1 10*3/uL — ABNORMAL LOW (ref 1.7–7.7)
Neutrophils Relative %: 32 %
Platelets: 258 10*3/uL (ref 150–400)
RBC: 4.5 MIL/uL (ref 3.87–5.11)
RDW: 13 % (ref 11.5–15.5)
WBC: 3.3 10*3/uL — ABNORMAL LOW (ref 4.0–10.5)
nRBC: 0 % (ref 0.0–0.2)

## 2019-10-03 LAB — COMPREHENSIVE METABOLIC PANEL
ALT: 9 U/L (ref 0–44)
AST: 13 U/L — ABNORMAL LOW (ref 15–41)
Albumin: 3.8 g/dL (ref 3.5–5.0)
Alkaline Phosphatase: 59 U/L (ref 38–126)
Anion gap: 11 (ref 5–15)
BUN: 9 mg/dL (ref 6–20)
CO2: 25 mmol/L (ref 22–32)
Calcium: 9.3 mg/dL (ref 8.9–10.3)
Chloride: 108 mmol/L (ref 98–111)
Creatinine, Ser: 0.81 mg/dL (ref 0.44–1.00)
GFR calc Af Amer: >60 mL/min (ref >60)
GFR calc non Af Amer: >60 mL/min (ref >60)
Glucose, Bld: 117 mg/dL — ABNORMAL HIGH (ref 70–99)
Potassium: 4.1 mmol/L (ref 3.5–5.1)
Sodium: 144 mmol/L (ref 135–145)
Total Bilirubin: 0.8 mg/dL (ref 0.3–1.2)
Total Protein: 6.8 g/dL (ref 6.5–8.1)

## 2019-10-03 LAB — URIC ACID: Uric Acid, Serum: 3.3 mg/dL (ref 2.5–7.1)

## 2019-10-03 LAB — LACTATE DEHYDROGENASE: LDH: 430 U/L — ABNORMAL HIGH (ref 98–192)

## 2019-10-03 MED ORDER — SODIUM CHLORIDE 0.9% FLUSH
10.0000 mL | Freq: Once | INTRAVENOUS | Status: AC
  Administered 2019-10-03: 10 mL
  Filled 2019-10-03: qty 10

## 2019-10-03 MED ORDER — HEPARIN SOD (PORK) LOCK FLUSH 100 UNIT/ML IV SOLN
250.0000 [IU] | Freq: Once | INTRAVENOUS | Status: AC
  Administered 2019-10-03: 250 [IU]
  Filled 2019-10-03: qty 5

## 2019-10-03 MED FILL — COPIKTRA 25 MG CAPS: 25 | 28 days supply | Qty: 56 | Fill #2

## 2019-10-03 NOTE — Assessment & Plan Note (Signed)
The cause is unknown. Oral examination is benign without signs of oral thrush As above, plan to order CT imaging for evaluation For now, she will continue to chew her food carefully.

## 2019-10-03 NOTE — Assessment & Plan Note (Signed)
This is likely due to recent treatment. The patient denies recent history of fevers, cough, chills, diarrhea or dysuria. She is asymptomatic from the leukopenia. I will observe for now.  I will continue the chemotherapy at current dose without dosage adjustment.  If the leukopenia gets progressive worse in the future, I might have to delay her treatment or adjust the chemotherapy dose.   

## 2019-10-03 NOTE — Progress Notes (Signed)
[facility] OFFICE PROGRESS NOTE  Patient Care Team: Janith Lima, MD as PCP - General (Internal Medicine) Constance Haw, MD as PCP - Electrophysiology (Cardiology) Constance Haw, MD as Consulting Physician (Cardiology)  ASSESSMENT & PLAN:  CLL (chronic lymphocytic leukemia) ([place]) Clinically, she appears to have responded well to treatment with no palpable lymphadenopathy However, she complained of dysphagia and has lost some weight due to difficulties with swallowing I recommend we proceed with CT imaging next week for objective assessment of response to therapy If the CT is unrevealing, I might have to refer her to GI service for evaluation  Dysphagia The cause is unknown. Oral examination is benign without signs of oral thrush As above, plan to order CT imaging for evaluation For now, she will continue to chew her food carefully.  Pancytopenia, acquired [facility]) This is likely due to recent treatment. The patient denies recent history of fevers, cough, chills, diarrhea or dysuria. She is asymptomatic from the leukopenia. I will observe for now.  I will continue the chemotherapy at current dose without dosage adjustment.  If the leukopenia gets progressive worse in the future, I might have to delay her treatment or adjust the chemotherapy dose.     Orders Placed This Encounter  Procedures  . CT Abdomen Pelvis W Contrast    Standing Status:   Future    Standing Expiration Date:   10/02/2020    Order Specific Question:   If indicated for the ordered procedure, I authorize the administration of contrast media per Radiology protocol    Answer:   Yes    Order Specific Question:   Preferred imaging location?    Answer:   [facility]    Order Specific Question:   Radiology Contrast Protocol - do NOT remove file path    Answer:   \\[email or web address]    Order Specific Question:   ** REASON FOR EXAM (FREE TEXT)    Answer:    CLL on treatment, new dysphagia, assess response    Order Specific Question:   Is patient pregnant?    Answer:   No  . CT Chest W Contrast    Standing Status:   Future    Standing Expiration Date:   10/02/2020    Order Specific Question:   If indicated for the ordered procedure, I authorize the administration of contrast media per Radiology protocol    Answer:   Yes    Order Specific Question:   Preferred imaging location?    Answer:   [facility]    Order Specific Question:   Radiology Contrast Protocol - do NOT remove file path    Answer:   \\[email or web address]    Order Specific Question:   ** REASON FOR EXAM (FREE TEXT)    Answer:   CLL on treatment, new dysphagia, assess response    Order Specific Question:   Is patient pregnant?    Answer:   No    INTERVAL HISTORY: Please see below for problem oriented charting. She returns for further follow-up Since last time I saw her, she denies recent infection, fever or chills No recent exacerbation of congestive heart failure However, she complained of symptoms of dysphagia with pills and food She denies recent choking or aspiration No new lymphadenopathy on exam  SUMMARY OF ONCOLOGIC HISTORY: Oncology History Overview Note  17p positive   CLL (chronic lymphocytic leukemia) ([place])  04/05/2015 Pathology Results   Accession: [id number] flow cytometry confirmed CLL. FISH was  positive for p53 mutation   04/24/2015 Imaging   Extensive lymphadenopathy throughout the neck, chest (axilla), abdomen and pelvis, as detailed above, compatible with the reported clinical history of lymphoma. 2. Mild splenomegaly.   05/03/2015 - 08/27/2015 Chemotherapy   She started on Ibrutinib, discontinued prematurely when her prescription ran out   10/10/2015 - 05/29/2017 Chemotherapy   She was restarted back on Ibrutinib   11/13/2016 PET scan   Significant generalized reduction in size of numerous lymph nodes in the neck, chest,  abdomen, and pelvis. Previously the activity of these nodes was low-level and in general a similar low-level activity is present today, significantly less than mediastinal blood pool activity, compatible with Deauville 2. 2. Coronary atherosclerosis. Mild cardiomegaly. 3. Mildly prominent endometrium for age without accentuated metabolic activity in the endometrium. Consider pelvic sonography for further characterization.   05/27/2017 PET scan   1. Progressive hypermetabolic adenopathy, primarily involving cervical, axillary, pelvic and inguinal lymph nodes bilaterally, consistent with progressive lymphoma. 2. No solid visceral organ or osseous involvement.   06/16/2017 - 08/25/2017 Chemotherapy   The patient had 3 cycles of Rituximab and Bendamustine   07/15/2017 - 07/19/2017 Hospital Admission   The patient was briefly admitted to the hospital due to infusion reaction to rituximab   09/18/2017 PET scan   1. Continued considerable adenopathy in the neck, chest, abdomen, and pelvis. This is generally stable in size but mildly reduced in activity compared to the prior exam. Current levels of activity primarily Deauville 3 and Deauville 4. No splenomegaly. 2. Diffuse new ground-glass opacities in the lungs with associated hypermetabolic activity. Some forms of lymphoma infiltration can rarely cause this pattern of diffuse ground-glass opacity and hypermetabolic activity. Differential diagnostic considerations might include atypical pneumonia such as mycoplasma, acute hypersensitivity pneumonitis, or acute eosinophilic pneumonia. Pulmonary hemorrhage seems less likely to cause this degree of accentuated metabolic activity.  3. Aortic Atherosclerosis (ICD10-I70.0). Coronary atherosclerosis.   09/24/2017 -  Chemotherapy   The patient had ventoclax for chemotherapy treatment.  Rituximab is added on 11/18/17 to 04/09/18, x 6 cycles   11/19/2017 PET scan   Overall mild interval decrease in hypermetabolic  lymphadenopathy throughout the neck, chest, abdomen, and pelvis. No new or increased lymphadenopathy identified.  While there has been resolution of diffuse hypermetabolic bilateral ground-glass pulmonary opacity since prior study, there is a new 14 mm hypermetabolic pulmonary nodule in the posterior right lower lobe. Time course favors inflammatory or infectious etiology over neoplasm. Recommend continued follow-up by chest CT in 3 months.   02/16/2018 PET scan   1. Adenopathy in the neck, chest, and pelvis is stable to minimally reduced in size, and is moderately reduced in activity, primarily Deauville 2 disease today. There is a right common iliac lymph node qualifying as Deauville 3 disease which is stable in size but reduced in activity. 2. Stable size but reduced activity in a pulmonary nodule in the right lower lobe. If this represents a leukemic lesion then a corresponds to Deauville 4 disease. This lesion was not readily apparent on 09/22/2017 but was reported on the prior chest CT of 11/19/2017. 3. Other imaging findings of potential clinical significance: Mild thyroid goiter. Aortic Atherosclerosis (ICD10-I70.0). Coronary atherosclerosis. Mild cardiomegaly.   04/19/2018 PET scan   1. Interval mild mixed metabolic changes, as detailed. Persistent mildly hypermetabolic bilateral axillary, mediastinal and bilateral pelvic adenopathy and mildly hypermetabolic right lower lobe pulmonary nodule compatible with lymphoproliferative disorder. Deauville 4 based on the subcarinal node. 2. Aortic Atherosclerosis (ICD10-I70.0).  06/29/2018 Procedure   Successful placement of a right internal jugular approach power injectable Port-A-Cath. The catheter is ready for immediate use.   07/26/2018 PET scan   1. Adenopathy primarily in the chest and pelvis as noted above, with size of the mildly enlarged lymph nodes stable to minimally increased, but with nodal activity generally decreased compared to the  prior exam. Uptake in these nodes is primarily Deauville 2. 2. Aortic Atherosclerosis (ICD10-I70.0). Coronary atherosclerosis with mild cardiomegaly.   10/29/2018 PET scan   1. Relatively stable sized lymphadenopathy but interval increase in hypermetabolism when compared to the most recent prior PET-CT, as detailed above. 2. No new disease is identified. 3. Stable bilateral pulmonary nodules.   11/12/2018 - 02/02/2019 Chemotherapy   The patient had riTUXimab (RITUXAN) 800 mg in sodium chloride 0.9 % 170 mL infusion, 375 mg/m2 = 800 mg, Intravenous,  Once, 3 of 4 cycles Administration: 800 mg (11/12/2018), 800 mg (12/10/2018), 800 mg (01/06/2019)  for chemotherapy treatment.    01/11/2019 Imaging   1. Significant increased adenopathy in the chest, abdomen, and pelvis compared to the 10/28/2018 PET-CT. 2. The scattered tiny pulmonary nodules and larger single right lower lobe pulmonary nodule are all stable. 3. Other imaging findings of potential clinical significance: Aortic Atherosclerosis (ICD10-I70.0). Coronary atherosclerosis. Mild cardiomegaly. Mild diffuse thyroid prominence, stable. Mild left foraminal impingement at L4-5.   01/21/2019 Procedure   Successful ultrasound-guided core biopsies of an enlarged right axillary lymph node.    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 The latter is important because it essentially rules out the possibility of mantle cell lymphoma. CD3 highlights a small population of T-cells. By flow cytometry, a kappa-restricted monoclonal B-cell population that expresses CD19, CD20, CD5, and CD23 comprises 89% of all lymphocytes. Overall, the features are  consistent with chronic lymphocytic leukemia/small lymphocytic lymphoma.   01/31/2019 Echocardiogram    1. The left ventricle has normal systolic function, with an ejection fraction of 55-60%. The cavity size was normal. Left ventricular diastolic Doppler parameters are consistent with impaired relaxation. No evidence of left ventricular regional wall motion abnormalities. GLS -20%.  2. The right ventricle has normal systolic function. The cavity was normal. There is no increase in right ventricular wall thickness.  3. The aortic valve is tricuspid. Mild calcification of the aortic valve. No stenosis of the aortic valve.  4. The aortic root is normal in size and structure.  5. 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  6. No evidence of mitral valve stenosis. No significant mitral regurgitation.  7. Normal IVC size. No complete TR doppler jet so unable to estimate PA systolic pressure.   02/03/2019 - 07/24/2019 Chemotherapy   The patient had obinutuzumab and Acalabrutinib for chemotherapy treatment.    05/02/2019 Imaging   1. Interval decrease in size axillary and mediastinal adenopathy. 2. There are a few retroperitoneal lymph nodes which are mildly increased in size. The majority of the retroperitoneal and pelvic lymph nodes are grossly similar when compared to prior exam. 3. Stable scattered pulmonary nodules.   07/22/2019 Imaging   1. Progressive disease within the neck, chest, abdomen, and pelvis, as evidenced by increase in adenopathy. 2. Similar bilateral pulmonary nodules. 3. Extensive right-sided colonic pneumatosis and small  volume extracolonic intraperitoneal gas. Correlate with abdominal symptoms. Of note, pneumatosis has been described in patients on immunotherapy. 4. Age advanced coronary artery atherosclerosis. Recommend assessment of coronary risk factors and consideration of medical therapy. 5.  Aortic Atherosclerosis (ICD10-I70.0). 6. Similar mild L1  compression deformity. 7. Trace free pelvic fluid, similar. 8. Pulmonary artery enlargement suggests pulmonary arterial hypertension.   08/11/2019 -  Chemotherapy   The patient had duvelisib for chemotherapy treatment.       REVIEW OF SYSTEMS:   Constitutional: Denies fevers, chills or abnormal weight loss Eyes: Denies blurriness of vision Ears, nose, mouth, throat, and face: Denies mucositis or sore throat Respiratory: Denies cough, dyspnea or wheezes Cardiovascular: Denies palpitation, chest discomfort or lower extremity swelling Gastrointestinal:  Denies nausea, heartburn or change in bowel habits Skin: Denies abnormal skin rashes Lymphatics: Denies new lymphadenopathy or easy bruising Neurological:Denies numbness, tingling or new weaknesses Behavioral/Psych: Mood is stable, no new changes  All other systems were reviewed with the patient and are negative.  I have reviewed the past medical history, past surgical history, social history and family history with the patient and they are unchanged from previous note.  ALLERGIES:  has No Known Allergies.  MEDICATIONS:  Current Outpatient Medications  Medication Sig Dispense Refill  . allopurinol (ZYLOPRIM) 300 MG tablet TAKE 1 TABLET BY MOUTH EVERY DAY 90 tablet 1  . Cholecalciferol (VITAMIN D-3) 1000 units CAPS Take 1,000 Units by mouth daily with breakfast.    . Duvelisib (COPIKTRA) 25 MG CAPS Take 25 mg by mouth 2 (two) times daily. Take with or without food, approximately 12 hours apart. 60 capsule 11  . ELIQUIS 5 MG TABS tablet TAKE 1 TABLET BY MOUTH TWICE A DAY 180 tablet 1  . furosemide (LASIX) 20 MG tablet TAKE 1 TABLET BY MOUTH EVERY DAY AS NEEDED 90 tablet 0  . lidocaine-prilocaine (EMLA) cream Apply 1 application topically as needed. 30 g 6  . metoprolol succinate (TOPROL-XL) 100 MG 24 hr tablet TAKE 1 AND 1/2 TABLETS BY MOUTH EVERY DAY. TAKE WITH FOOD OR RIGHT AFTER A MEAL 135 tablet 2  . potassium chloride SA  (K-DUR,KLOR-CON) 20 MEQ tablet Take 1 tablet (20 mEq total) by mouth daily. 30 tablet 11  . predniSONE (DELTASONE) 20 MG tablet TAKE 1 TABLET BY MOUTH DAILY WITH BREAKFAST 30 tablet 0  . sacubitril-valsartan (ENTRESTO) 49-51 MG Take 1 tablet by mouth 2 (two) times daily. 60 tablet 3  . sulfamethoxazole-trimethoprim (BACTRIM) 400-80 MG tablet Take 1 tablet by mouth daily. 30 tablet 6  . valGANciclovir (VALCYTE) 450 MG tablet Take 2 tablets (900 mg total) by mouth daily. 60 tablet 6   No current facility-administered medications for this visit.    PHYSICAL EXAMINATION: ECOG PERFORMANCE STATUS: 1 - Symptomatic but completely ambulatory  Vitals:   10/03/19 1303  BP: 118/71  Pulse: 69  Resp: 18  Temp: 97.6 F (36.4 C)  SpO2: 100%   Filed Weights   10/03/19 1303  Weight: 196 lb (88.9 kg)    GENERAL:alert, no distress and comfortable SKIN: skin color, texture, turgor are normal, no rashes or significant lesions EYES: normal, Conjunctiva are pink and non-injected, sclera clear OROPHARYNX:no exudate, no erythema and lips, buccal mucosa, and tongue normal  NECK: supple, thyroid normal size, non-tender, without nodularity LYMPH:  no palpable lymphadenopathy in the cervical, axillary or inguinal LUNGS: clear to auscultation and percussion with normal breathing effort HEART: regular rate & rhythm and no murmurs and no lower extremity edema ABDOMEN:abdomen soft,  non-tender and normal bowel sounds Musculoskeletal:no cyanosis of digits and no clubbing  NEURO: alert & oriented x 3 with fluent speech, no focal motor/sensory deficits  LABORATORY DATA:  I have reviewed the data as listed    Component Value Date/Time   NA 144 10/03/2019 1238   NA 140 10/09/2017 1416   K 4.1 10/03/2019 1238   K 4.4 10/09/2017 1416   CL 108 10/03/2019 1238   CL 105 01/18/2013 0912   CO2 25 10/03/2019 1238   CO2 25 10/09/2017 1416   GLUCOSE 117 (H) 10/03/2019 1238   GLUCOSE 117 10/09/2017 1416   GLUCOSE  83 01/18/2013 0912   BUN 9 10/03/2019 1238   BUN 11.1 10/09/2017 1416   CREATININE 0.81 10/03/2019 1238   CREATININE 0.72 11/01/2018 0903   CREATININE 0.8 10/09/2017 1416   CALCIUM 9.3 10/03/2019 1238   CALCIUM 9.6 10/09/2017 1416   PROT 6.8 10/03/2019 1238   PROT 6.4 10/09/2017 1416   ALBUMIN 3.8 10/03/2019 1238   ALBUMIN 3.4 (L) 10/09/2017 1416   AST 13 (L) 10/03/2019 1238   AST 17 11/01/2018 0903   AST 18 10/09/2017 1416   ALT 9 10/03/2019 1238   ALT 15 11/01/2018 0903   ALT 19 10/09/2017 1416   ALKPHOS 59 10/03/2019 1238   ALKPHOS 69 10/09/2017 1416   BILITOT 0.8 10/03/2019 1238   BILITOT 1.3 (H) 11/01/2018 0903   BILITOT 0.57 10/09/2017 1416   GFRNONAA >60 10/03/2019 1238   GFRNONAA >60 11/01/2018 0903   GFRAA >60 10/03/2019 1238   GFRAA >60 11/01/2018 0903    No results found for: SPEP, UPEP  Lab Results  Component Value Date   WBC 3.3 (L) 10/03/2019   NEUTROABS 1.1 (L) 10/03/2019   HGB 13.1 10/03/2019   HCT 40.7 10/03/2019   MCV 90.4 10/03/2019   PLT 258 10/03/2019      Chemistry      Component Value Date/Time   NA 144 10/03/2019 1238   NA 140 10/09/2017 1416   K 4.1 10/03/2019 1238   K 4.4 10/09/2017 1416   CL 108 10/03/2019 1238   CL 105 01/18/2013 0912   CO2 25 10/03/2019 1238   CO2 25 10/09/2017 1416   BUN 9 10/03/2019 1238   BUN 11.1 10/09/2017 1416   CREATININE 0.81 10/03/2019 1238   CREATININE 0.72 11/01/2018 0903   CREATININE 0.8 10/09/2017 1416      Component Value Date/Time   CALCIUM 9.3 10/03/2019 1238   CALCIUM 9.6 10/09/2017 1416   ALKPHOS 59 10/03/2019 1238   ALKPHOS 69 10/09/2017 1416   AST 13 (L) 10/03/2019 1238   AST 17 11/01/2018 0903   AST 18 10/09/2017 1416   ALT 9 10/03/2019 1238   ALT 15 11/01/2018 0903   ALT 19 10/09/2017 1416   BILITOT 0.8 10/03/2019 1238   BILITOT 1.3 (H) 11/01/2018 0903   BILITOT 0.57 10/09/2017 1416     All questions were answered. The patient knows to call the clinic with any problems,  questions or concerns. No barriers to learning was detected.  I spent 25 minutes counseling the patient face to face. The total time spent in the appointment was 30 minutes and more than 50% was on counseling and review of test results  Heath Lark, MD 10/03/2019 2:11 PM

## 2019-10-03 NOTE — Assessment & Plan Note (Signed)
Clinically, she appears to have responded well to treatment with no palpable lymphadenopathy However, she complained of dysphagia and has lost some weight due to difficulties with swallowing I recommend we proceed with CT imaging next week for objective assessment of response to therapy If the CT is unrevealing, I might have to refer her to GI service for evaluation

## 2019-10-03 NOTE — Telephone Encounter (Signed)
Scheduled appt per 12/28 sch message - unable to reach pt . Left message with appt date and time

## 2019-10-06 ENCOUNTER — Telehealth: Payer: Self-pay

## 2019-10-06 NOTE — Telephone Encounter (Signed)
-----   Message from Heath Lark, MD sent at 10/06/2019  8:13 AM EST ----- Regarding: Ct scan I have asked her to call to schedule CT but she did not Can you make sure it gets scheduled on 1/6?

## 2019-10-06 NOTE — Telephone Encounter (Signed)
Called and given below message. She verbalized understanding. Offered to schedule scan. She declined offer and said she will call to schedule.

## 2019-10-07 ENCOUNTER — Encounter (HOSPITAL_COMMUNITY): Payer: Self-pay | Admitting: Emergency Medicine

## 2019-10-07 ENCOUNTER — Other Ambulatory Visit: Payer: Self-pay

## 2019-10-07 ENCOUNTER — Ambulatory Visit (HOSPITAL_COMMUNITY)
Admission: EM | Admit: 2019-10-07 | Discharge: 2019-10-07 | Disposition: A | Discharge status: Home / Self Care | Payer: BC Managed Care – PPO | Attending: Family Medicine | Admitting: Family Medicine

## 2019-10-07 DIAGNOSIS — I10 Essential (primary) hypertension: Secondary | ICD-10-CM | POA: Insufficient documentation

## 2019-10-07 DIAGNOSIS — R059 Cough, unspecified: Secondary | ICD-10-CM

## 2019-10-07 DIAGNOSIS — R112 Nausea with vomiting, unspecified: Secondary | ICD-10-CM | POA: Insufficient documentation

## 2019-10-07 DIAGNOSIS — R519 Headache, unspecified: Secondary | ICD-10-CM | POA: Insufficient documentation

## 2019-10-07 DIAGNOSIS — Z7901 Long term (current) use of anticoagulants: Secondary | ICD-10-CM | POA: Insufficient documentation

## 2019-10-07 DIAGNOSIS — C9110 Chronic lymphocytic leukemia of B-cell type not having achieved remission: Secondary | ICD-10-CM | POA: Diagnosis not present

## 2019-10-07 DIAGNOSIS — M542 Cervicalgia: Secondary | ICD-10-CM | POA: Insufficient documentation

## 2019-10-07 DIAGNOSIS — R5381 Other malaise: Secondary | ICD-10-CM

## 2019-10-07 DIAGNOSIS — Z8249 Family history of ischemic heart disease and other diseases of the circulatory system: Secondary | ICD-10-CM | POA: Diagnosis not present

## 2019-10-07 DIAGNOSIS — I4819 Other persistent atrial fibrillation: Secondary | ICD-10-CM | POA: Diagnosis not present

## 2019-10-07 DIAGNOSIS — B349 Viral infection, unspecified: Secondary | ICD-10-CM | POA: Insufficient documentation

## 2019-10-07 DIAGNOSIS — Z20822 Contact with and (suspected) exposure to covid-19: Secondary | ICD-10-CM | POA: Insufficient documentation

## 2019-10-07 DIAGNOSIS — M791 Myalgia, unspecified site: Secondary | ICD-10-CM | POA: Insufficient documentation

## 2019-10-07 DIAGNOSIS — R221 Localized swelling, mass and lump, neck: Secondary | ICD-10-CM | POA: Diagnosis not present

## 2019-10-07 DIAGNOSIS — R05 Cough: Secondary | ICD-10-CM | POA: Insufficient documentation

## 2019-10-07 DIAGNOSIS — Z79899 Other long term (current) drug therapy: Secondary | ICD-10-CM | POA: Insufficient documentation

## 2019-10-07 DIAGNOSIS — E785 Hyperlipidemia, unspecified: Secondary | ICD-10-CM | POA: Insufficient documentation

## 2019-10-07 MED ORDER — ONDANSETRON 8 MG PO TBDP: 8.0000 mg | ORAL_TABLET | Freq: Three times a day (TID) | ORAL | 0 refills | Status: AC | PRN

## 2019-10-07 MED ORDER — BENZONATATE 100 MG PO CAPS: 100.0000 mg | ORAL_CAPSULE | Freq: Three times a day (TID) | ORAL | 0 refills | Status: DC | PRN

## 2019-10-07 MED ORDER — ONDANSETRON HCL 4 MG/2ML IJ SOLN
4.0000 mg | Freq: Once | INTRAMUSCULAR | Status: AC
  Administered 2019-10-07: 14:00:00 4 mg via INTRAVENOUS

## 2019-10-07 MED ORDER — ONDANSETRON HCL 4 MG/2ML IJ SOLN
INTRAMUSCULAR | Status: AC
  Filled 2019-10-07: qty 2

## 2019-10-07 MED ORDER — PROMETHAZINE HCL 25 MG RE SUPP: 25.0000 mg | Freq: Four times a day (QID) | RECTAL | 0 refills | Status: DC | PRN

## 2019-10-07 MED ORDER — SODIUM CHLORIDE 0.9 % IV BOLUS
1000.0000 mL | Freq: Once | INTRAVENOUS | Status: AC
  Administered 2019-10-07: 1000 mL via INTRAVENOUS

## 2019-10-07 MED ORDER — PROMETHAZINE-DM 6.25-15 MG/5ML PO SYRP: 5.0000 mL | ORAL_SOLUTION | Freq: Every evening | ORAL | 0 refills | Status: DC | PRN

## 2019-10-07 NOTE — ED Triage Notes (Signed)
Pt c/o neck pain, head pain, sore muscles, coughing, vomiting x3 days.

## 2019-10-07 NOTE — ED Provider Notes (Signed)
[place]   MRN: [id number] DOB: 1960/11/12  Subjective:   Audrey Gross is a 59 y.o. female presenting for 3 day hx of acute onset moderate malaise including body aches, neck soreness, headaches, nausea with vomiting. Patient has cancer, is getting treated for CLL.  Denies any known COVID-19 contacts.  No current facility-administered medications for this encounter.  Current Outpatient Medications:  .  allopurinol (ZYLOPRIM) 300 MG tablet, TAKE 1 TABLET BY MOUTH EVERY DAY, Disp: 90 tablet, Rfl: 1 .  Cholecalciferol (VITAMIN D-3) 1000 units CAPS, Take 1,000 Units by mouth daily with breakfast., Disp: , Rfl:  .  Duvelisib (COPIKTRA) 25 MG CAPS, Take 25 mg by mouth 2 (two) times daily. Take with or without food, approximately 12 hours apart., Disp: 60 capsule, Rfl: 11 .  ELIQUIS 5 MG TABS tablet, TAKE 1 TABLET BY MOUTH TWICE A DAY, Disp: 180 tablet, Rfl: 1 .  furosemide (LASIX) 20 MG tablet, TAKE 1 TABLET BY MOUTH EVERY DAY AS NEEDED, Disp: 90 tablet, Rfl: 0 .  lidocaine-prilocaine (EMLA) cream, Apply 1 application topically as needed., Disp: 30 g, Rfl: 6 .  metoprolol succinate (TOPROL-XL) 100 MG 24 hr tablet, TAKE 1 AND 1/2 TABLETS BY MOUTH EVERY DAY. TAKE WITH FOOD OR RIGHT AFTER A MEAL, Disp: 135 tablet, Rfl: 2 .  potassium chloride SA (K-DUR,KLOR-CON) 20 MEQ tablet, Take 1 tablet (20 mEq total) by mouth daily., Disp: 30 tablet, Rfl: 11 .  predniSONE (DELTASONE) 20 MG tablet, TAKE 1 TABLET BY MOUTH DAILY WITH BREAKFAST, Disp: 30 tablet, Rfl: 0 .  sacubitril-valsartan (ENTRESTO) 49-51 MG, Take 1 tablet by mouth 2 (two) times daily., Disp: 60 tablet, Rfl: 3 .  sulfamethoxazole-trimethoprim (BACTRIM) 400-80 MG tablet, Take 1 tablet by mouth daily., Disp: 30 tablet, Rfl: 6 .  valGANciclovir (VALCYTE) 450 MG tablet, Take 2 tablets (900 mg total) by mouth daily., Disp: 60 tablet, Rfl: 6   No Known Allergies  Past Medical History:  Diagnosis Date  . Atrial fibrillation ([place])   .  CLL (chronic lymphoblastic leukemia)    Leukemia  . Cystic breast   . Depression   . Diffuse cystic mastopathy   . Dyslipidemia (high LDL; low HDL)   . Hypertension   . Iron deficiency anemia, unspecified   . Lymphadenopathy of head and neck 04/05/2015  . Medical non-compliance   . Mitral regurgitation 02/2017   moderate to severe  . Persistent atrial fibrillation with rapid ventricular response ([place]) 07/15/2017     Past Surgical History:  Procedure Laterality Date  . CARDIOVERSION N/A 04/02/2017   Procedure: CARDIOVERSION;  Surgeon: Dorothy Spark, MD;  Location: [facility] ENDOSCOPY;  Service: Cardiovascular;  Laterality: N/A;  . CARDIOVERSION N/A 08/19/2017   Procedure: CARDIOVERSION;  Surgeon: Larey Dresser, MD;  Location: [facility];  Service: Cardiovascular;  Laterality: N/A;  . IR IMAGING GUIDED PORT INSERTION  06/29/2018  . TUBAL LIGATION      Family History  Problem Relation Age of Onset  . Sudden death Mother   . Kidney disease Father        H/O HD and kidney transplant  . Stomach cancer Maternal Aunt   . Stomach cancer Paternal Grandmother   . Heart disease Brother   . Heart disease Maternal Grandmother     Social History   Tobacco Use  . Smoking status: Never Smoker  . Smokeless tobacco: Never Used  Substance Use Topics  . Alcohol use: No  . Drug use: No    Review  of Systems  Constitutional: Negative for fever and malaise/fatigue.  HENT: Negative for congestion, ear pain, sinus pain and sore throat.   Eyes: Negative for discharge and redness.  Respiratory: Negative for cough, hemoptysis, shortness of breath and wheezing.   Cardiovascular: Negative for chest pain.  Gastrointestinal: Positive for nausea and vomiting. Negative for abdominal pain, blood in stool, constipation and diarrhea.  Genitourinary: Negative for dysuria, flank pain and hematuria.  Musculoskeletal: Positive for myalgias and neck pain. Negative for back pain and joint pain.  Skin:  Negative for rash.  Neurological: Negative for dizziness, weakness and headaches.  Psychiatric/Behavioral: Negative for depression and substance abuse.    Objective:   Vitals: BP (!) 167/87   Pulse 90   Temp 99.8 F (37.7 C)   Resp 18   LMP 02/03/2017 (Approximate)   SpO2 100%   Physical Exam Constitutional:      General: She is not in acute distress.    Appearance: Normal appearance. She is well-developed and normal weight. She is not ill-appearing, toxic-appearing or diaphoretic.  HENT:     Head: Normocephalic and atraumatic.     Right Ear: External ear normal.     Left Ear: External ear normal.     Nose: Nose normal.     Mouth/Throat:     Mouth: Mucous membranes are moist.     Pharynx: Oropharynx is clear.  Eyes:     General: No scleral icterus.    Extraocular Movements: Extraocular movements intact.     Pupils: Pupils are equal, round, and reactive to light.  Cardiovascular:     Rate and Rhythm: Normal rate and regular rhythm.     Heart sounds: Normal heart sounds. No murmur. No friction rub. No gallop.   Pulmonary:     Effort: Pulmonary effort is normal. No respiratory distress.     Breath sounds: Normal breath sounds. No stridor. No wheezing, rhonchi or rales.  Abdominal:     General: Bowel sounds are normal. There is no distension.     Palpations: Abdomen is soft. There is no mass.     Tenderness: There is no abdominal tenderness. There is no right CVA tenderness, left CVA tenderness, guarding or rebound.  Skin:    General: Skin is warm and dry.     Coloration: Skin is not pale.     Findings: No rash.  Neurological:     General: No focal deficit present.     Mental Status: She is alert and oriented to person, place, and time.  Psychiatric:        Mood and Affect: Mood normal.        Behavior: Behavior normal.        Thought Content: Thought content normal.        Judgment: Judgment normal.    1 L IV fluids with IV Zofran at 4 mg given.  Assessment and  Plan :   1. Viral illness   2. Nausea and vomiting, intractability of vomiting not specified, unspecified vomiting type   3. Myalgia   4. Neck pain   5. Generalized headaches   6. Sensation of lump in throat   7. CLL (chronic lymphocytic leukemia) ([place])   8. Cough     Patient noted improvement with fluids and Zofran.  She still feels like she would vomit if she tried to eat anything.  We will have her use either Zofran or Phenergan at home for nausea and vomiting.  Recommended general supportive care otherwise, COVID-19 testing  is pending.  Suspect viral syndrome, patient is high risk given her current cancer.  She is to follow-up ASAP with her PCP, states that she has an appointment on Monday. Counseled patient on potential for adverse effects with medications prescribed/recommended today, ER and return-to-clinic precautions discussed, patient verbalized understanding.    Jaynee Eagles, PA-C 10/07/19 [id number]

## 2019-10-07 NOTE — Discharge Instructions (Signed)
We will manage this as a viral syndrome. For sore throat or cough try using a honey-based tea. Use 3 teaspoons of honey with juice squeezed from half lemon. Place shaved pieces of ginger into 1/2-1 cup of water and warm over stove top. Then mix the ingredients and repeat every 4 hours as needed. Please take Tylenol 500mg every 6 hours. Hydrate very well with at least 2 liters of water. Eat light meals such as soups to replenish electrolytes and soft fruits, veggies. Start an antihistamine like Zyrtec (cetirizine) at 10mg daily for postnasal drainage, sinus congestion. 

## 2019-10-09 LAB — NOVEL CORONAVIRUS, NAA (HOSP ORDER, SEND-OUT TO REF LAB; TAT 18-24 HRS): SARS-CoV-2, NAA: NOT DETECTED

## 2019-10-11 ENCOUNTER — Telehealth: Payer: Self-pay | Admitting: *Deleted

## 2019-10-11 NOTE — Telephone Encounter (Signed)
Patient called concerned she will not be able to drink oral contrast tomorrow for CT scan.   She has increased difficulty swallowing and coughing until vomiting occurs. Patient has not been able to eat a normal meal since Tuesday last week. She went to Urgent Care on 10/07/19 and was given antiemetics. This does not help her. She feels a gagging sensation in her throat when anything is attempted to be swallowed. The Zofran ODT does not help.   She is not going to be able to drink the contrast.   Advised patient to still plan on going to CT tomorrow. Writer would call her in the morning with advise from Dr. Alvy Bimler.

## 2019-10-12 ENCOUNTER — Other Ambulatory Visit: Payer: Self-pay

## 2019-10-12 ENCOUNTER — Ambulatory Visit (HOSPITAL_COMMUNITY)
Admission: RE | Admit: 2019-10-12 | Discharge: 2019-10-12 | Disposition: A | Discharge status: Home / Self Care | Payer: BC Managed Care – PPO | Source: Ambulatory Visit | Attending: Hematology and Oncology | Admitting: Hematology and Oncology

## 2019-10-12 DIAGNOSIS — I4819 Other persistent atrial fibrillation: Secondary | ICD-10-CM | POA: Diagnosis not present

## 2019-10-12 DIAGNOSIS — D61818 Other pancytopenia: Secondary | ICD-10-CM | POA: Diagnosis not present

## 2019-10-12 DIAGNOSIS — C9110 Chronic lymphocytic leukemia of B-cell type not having achieved remission: Secondary | ICD-10-CM

## 2019-10-12 DIAGNOSIS — K5900 Constipation, unspecified: Secondary | ICD-10-CM | POA: Diagnosis not present

## 2019-10-12 DIAGNOSIS — J029 Acute pharyngitis, unspecified: Secondary | ICD-10-CM | POA: Diagnosis not present

## 2019-10-12 DIAGNOSIS — K2210 Ulcer of esophagus without bleeding: Secondary | ICD-10-CM | POA: Diagnosis not present

## 2019-10-12 DIAGNOSIS — I340 Nonrheumatic mitral (valve) insufficiency: Secondary | ICD-10-CM | POA: Diagnosis present

## 2019-10-12 DIAGNOSIS — Z8249 Family history of ischemic heart disease and other diseases of the circulatory system: Secondary | ICD-10-CM | POA: Diagnosis not present

## 2019-10-12 DIAGNOSIS — B370 Candidal stomatitis: Secondary | ICD-10-CM | POA: Diagnosis not present

## 2019-10-12 DIAGNOSIS — E785 Hyperlipidemia, unspecified: Secondary | ICD-10-CM | POA: Diagnosis present

## 2019-10-12 DIAGNOSIS — Z20822 Contact with and (suspected) exposure to covid-19: Secondary | ICD-10-CM | POA: Diagnosis not present

## 2019-10-12 DIAGNOSIS — K2980 Duodenitis without bleeding: Secondary | ICD-10-CM | POA: Diagnosis not present

## 2019-10-12 DIAGNOSIS — K209 Esophagitis, unspecified without bleeding: Secondary | ICD-10-CM | POA: Diagnosis not present

## 2019-10-12 DIAGNOSIS — R509 Fever, unspecified: Secondary | ICD-10-CM | POA: Diagnosis not present

## 2019-10-12 DIAGNOSIS — F329 Major depressive disorder, single episode, unspecified: Secondary | ICD-10-CM | POA: Diagnosis not present

## 2019-10-12 DIAGNOSIS — Z79899 Other long term (current) drug therapy: Secondary | ICD-10-CM | POA: Diagnosis not present

## 2019-10-12 DIAGNOSIS — K319 Disease of stomach and duodenum, unspecified: Secondary | ICD-10-CM | POA: Diagnosis not present

## 2019-10-12 DIAGNOSIS — E876 Hypokalemia: Secondary | ICD-10-CM | POA: Diagnosis not present

## 2019-10-12 DIAGNOSIS — I4891 Unspecified atrial fibrillation: Secondary | ICD-10-CM | POA: Diagnosis not present

## 2019-10-12 DIAGNOSIS — K3189 Other diseases of stomach and duodenum: Secondary | ICD-10-CM | POA: Diagnosis not present

## 2019-10-12 DIAGNOSIS — I10 Essential (primary) hypertension: Secondary | ICD-10-CM | POA: Diagnosis not present

## 2019-10-12 DIAGNOSIS — R1310 Dysphagia, unspecified: Secondary | ICD-10-CM | POA: Diagnosis not present

## 2019-10-12 DIAGNOSIS — Z7901 Long term (current) use of anticoagulants: Secondary | ICD-10-CM | POA: Diagnosis not present

## 2019-10-12 DIAGNOSIS — Z841 Family history of disorders of kidney and ureter: Secondary | ICD-10-CM | POA: Diagnosis not present

## 2019-10-12 DIAGNOSIS — K2970 Gastritis, unspecified, without bleeding: Secondary | ICD-10-CM | POA: Diagnosis not present

## 2019-10-12 DIAGNOSIS — R1312 Dysphagia, oropharyngeal phase: Secondary | ICD-10-CM | POA: Diagnosis not present

## 2019-10-12 DIAGNOSIS — Z800 Family history of malignant neoplasm of digestive organs: Secondary | ICD-10-CM | POA: Diagnosis not present

## 2019-10-12 DIAGNOSIS — K2950 Unspecified chronic gastritis without bleeding: Secondary | ICD-10-CM | POA: Diagnosis not present

## 2019-10-12 DIAGNOSIS — E860 Dehydration: Secondary | ICD-10-CM | POA: Diagnosis not present

## 2019-10-12 DIAGNOSIS — K208 Other esophagitis without bleeding: Secondary | ICD-10-CM | POA: Diagnosis not present

## 2019-10-12 MED ORDER — IOHEXOL 300 MG/ML  SOLN
100.0000 mL | Freq: Once | INTRAMUSCULAR | Status: AC | PRN
  Administered 2019-10-12: 11:00:00 100 mL via INTRAVENOUS

## 2019-10-12 MED ORDER — SODIUM CHLORIDE (PF) 0.9 % IJ SOLN
INTRAMUSCULAR | Status: AC
  Filled 2019-10-12: qty 50

## 2019-10-12 NOTE — Telephone Encounter (Signed)
Do what she can If she insist she will not even try to swallow then proceed without oral contrast

## 2019-10-12 NOTE — Telephone Encounter (Signed)
Telephone call returned to patient. RN advised her to do what she can, try to drink the contrast but if she is not able the scan will still be completed. Patient agreed to attempt oral contrast and confirms scan time.  Called radiology to notify them of barrier to drinking oral contrast.

## 2019-10-13 ENCOUNTER — Inpatient Hospital Stay: Payer: BC Managed Care – PPO

## 2019-10-13 ENCOUNTER — Inpatient Hospital Stay: Payer: BC Managed Care – PPO | Attending: Hematology and Oncology | Admitting: Hematology and Oncology

## 2019-10-13 ENCOUNTER — Encounter (HOSPITAL_COMMUNITY): Payer: Self-pay | Admitting: Hematology and Oncology

## 2019-10-13 ENCOUNTER — Other Ambulatory Visit: Payer: Self-pay | Admitting: Hematology and Oncology

## 2019-10-13 ENCOUNTER — Other Ambulatory Visit: Payer: Self-pay

## 2019-10-13 ENCOUNTER — Inpatient Hospital Stay (HOSPITAL_COMMUNITY)
Admission: AD | Admit: 2019-10-13 | Discharge: 2019-10-19 | DRG: 381 | Disposition: A | Discharge status: Home / Self Care | Payer: BC Managed Care – PPO | Source: Ambulatory Visit | Attending: Hematology and Oncology | Admitting: Hematology and Oncology

## 2019-10-13 VITALS — BP 147/76 | HR 87 | Temp 99.1°F | Resp 16 | Wt 186.2 lb

## 2019-10-13 DIAGNOSIS — B370 Candidal stomatitis: Secondary | ICD-10-CM | POA: Diagnosis present

## 2019-10-13 DIAGNOSIS — Z841 Family history of disorders of kidney and ureter: Secondary | ICD-10-CM | POA: Diagnosis not present

## 2019-10-13 DIAGNOSIS — R1312 Dysphagia, oropharyngeal phase: Secondary | ICD-10-CM

## 2019-10-13 DIAGNOSIS — C9112 Chronic lymphocytic leukemia of B-cell type in relapse: Secondary | ICD-10-CM | POA: Insufficient documentation

## 2019-10-13 DIAGNOSIS — Z800 Family history of malignant neoplasm of digestive organs: Secondary | ICD-10-CM | POA: Diagnosis not present

## 2019-10-13 DIAGNOSIS — I340 Nonrheumatic mitral (valve) insufficiency: Secondary | ICD-10-CM | POA: Diagnosis present

## 2019-10-13 DIAGNOSIS — C9110 Chronic lymphocytic leukemia of B-cell type not having achieved remission: Secondary | ICD-10-CM

## 2019-10-13 DIAGNOSIS — F329 Major depressive disorder, single episode, unspecified: Secondary | ICD-10-CM | POA: Diagnosis present

## 2019-10-13 DIAGNOSIS — Z20822 Contact with and (suspected) exposure to covid-19: Secondary | ICD-10-CM | POA: Diagnosis present

## 2019-10-13 DIAGNOSIS — K5900 Constipation, unspecified: Secondary | ICD-10-CM | POA: Diagnosis present

## 2019-10-13 DIAGNOSIS — Z8249 Family history of ischemic heart disease and other diseases of the circulatory system: Secondary | ICD-10-CM | POA: Diagnosis not present

## 2019-10-13 DIAGNOSIS — K209 Esophagitis, unspecified without bleeding: Secondary | ICD-10-CM | POA: Diagnosis not present

## 2019-10-13 DIAGNOSIS — E785 Hyperlipidemia, unspecified: Secondary | ICD-10-CM | POA: Diagnosis present

## 2019-10-13 DIAGNOSIS — Z79899 Other long term (current) drug therapy: Secondary | ICD-10-CM | POA: Diagnosis not present

## 2019-10-13 DIAGNOSIS — K3189 Other diseases of stomach and duodenum: Secondary | ICD-10-CM

## 2019-10-13 DIAGNOSIS — K5909 Other constipation: Secondary | ICD-10-CM

## 2019-10-13 DIAGNOSIS — K2970 Gastritis, unspecified, without bleeding: Secondary | ICD-10-CM

## 2019-10-13 DIAGNOSIS — D61818 Other pancytopenia: Secondary | ICD-10-CM | POA: Insufficient documentation

## 2019-10-13 DIAGNOSIS — I4819 Other persistent atrial fibrillation: Secondary | ICD-10-CM | POA: Diagnosis present

## 2019-10-13 DIAGNOSIS — E876 Hypokalemia: Secondary | ICD-10-CM

## 2019-10-13 DIAGNOSIS — R1310 Dysphagia, unspecified: Secondary | ICD-10-CM | POA: Insufficient documentation

## 2019-10-13 DIAGNOSIS — Z7901 Long term (current) use of anticoagulants: Secondary | ICD-10-CM | POA: Diagnosis not present

## 2019-10-13 DIAGNOSIS — R509 Fever, unspecified: Secondary | ICD-10-CM | POA: Diagnosis not present

## 2019-10-13 DIAGNOSIS — Z95828 Presence of other vascular implants and grafts: Secondary | ICD-10-CM

## 2019-10-13 DIAGNOSIS — I10 Essential (primary) hypertension: Secondary | ICD-10-CM | POA: Insufficient documentation

## 2019-10-13 DIAGNOSIS — E860 Dehydration: Secondary | ICD-10-CM | POA: Diagnosis present

## 2019-10-13 DIAGNOSIS — I5022 Chronic systolic (congestive) heart failure: Secondary | ICD-10-CM

## 2019-10-13 DIAGNOSIS — K208 Other esophagitis without bleeding: Secondary | ICD-10-CM | POA: Diagnosis not present

## 2019-10-13 DIAGNOSIS — K2210 Ulcer of esophagus without bleeding: Principal | ICD-10-CM | POA: Diagnosis present

## 2019-10-13 DIAGNOSIS — K319 Disease of stomach and duodenum, unspecified: Secondary | ICD-10-CM | POA: Diagnosis present

## 2019-10-13 DIAGNOSIS — Z452 Encounter for adjustment and management of vascular access device: Secondary | ICD-10-CM

## 2019-10-13 DIAGNOSIS — I4891 Unspecified atrial fibrillation: Secondary | ICD-10-CM | POA: Diagnosis not present

## 2019-10-13 DIAGNOSIS — J029 Acute pharyngitis, unspecified: Secondary | ICD-10-CM | POA: Diagnosis not present

## 2019-10-13 DIAGNOSIS — E46 Unspecified protein-calorie malnutrition: Secondary | ICD-10-CM | POA: Insufficient documentation

## 2019-10-13 LAB — CBC WITH DIFFERENTIAL/PLATELET
Abs Immature Granulocytes: 0.25 10*3/uL — ABNORMAL HIGH (ref 0.00–0.07)
Basophils Absolute: 0.1 10*3/uL (ref 0.0–0.1)
Basophils Relative: 4 %
Eosinophils Absolute: 0 10*3/uL (ref 0.0–0.5)
Eosinophils Relative: 1 %
HCT: 38.8 % (ref 36.0–46.0)
Hemoglobin: 12.8 g/dL (ref 12.0–15.0)
Immature Granulocytes: 11 %
Lymphocytes Relative: 12 %
Lymphs Abs: 0.3 10*3/uL — ABNORMAL LOW (ref 0.7–4.0)
MCH: 28.6 pg (ref 26.0–34.0)
MCHC: 33 g/dL (ref 30.0–36.0)
MCV: 86.8 fL (ref 80.0–100.0)
Monocytes Absolute: 0.5 10*3/uL (ref 0.1–1.0)
Monocytes Relative: 19 %
Neutro Abs: 1.3 10*3/uL — ABNORMAL LOW (ref 1.7–7.7)
Neutrophils Relative %: 53 %
Platelets: 309 10*3/uL (ref 150–400)
RBC: 4.47 MIL/uL (ref 3.87–5.11)
RDW: 12.5 % (ref 11.5–15.5)
WBC: 2.4 10*3/uL — ABNORMAL LOW (ref 4.0–10.5)
nRBC: 0 % (ref 0.0–0.2)

## 2019-10-13 LAB — COMPREHENSIVE METABOLIC PANEL
ALT: 9 U/L (ref 0–44)
AST: 20 U/L (ref 15–41)
Albumin: 3.9 g/dL (ref 3.5–5.0)
Alkaline Phosphatase: 58 U/L (ref 38–126)
Anion gap: 20 — ABNORMAL HIGH (ref 5–15)
BUN: 12 mg/dL (ref 6–20)
CO2: 18 mmol/L — ABNORMAL LOW (ref 22–32)
Calcium: 9 mg/dL (ref 8.9–10.3)
Chloride: 107 mmol/L (ref 98–111)
Creatinine, Ser: 0.86 mg/dL (ref 0.44–1.00)
GFR calc Af Amer: >60 mL/min (ref >60)
GFR calc non Af Amer: >60 mL/min (ref >60)
Glucose, Bld: 97 mg/dL (ref 70–99)
Potassium: 2.9 mmol/L — CL (ref 3.5–5.1)
Sodium: 145 mmol/L (ref 135–145)
Total Bilirubin: 0.9 mg/dL (ref 0.3–1.2)
Total Protein: 6.9 g/dL (ref 6.5–8.1)

## 2019-10-13 LAB — MAGNESIUM: Magnesium: 1.6 mg/dL — ABNORMAL LOW (ref 1.7–2.4)

## 2019-10-13 LAB — SARS CORONAVIRUS 2 (TAT 6-24 HRS): SARS Coronavirus 2: NEGATIVE

## 2019-10-13 LAB — LACTATE DEHYDROGENASE: LDH: 609 U/L — ABNORMAL HIGH (ref 98–192)

## 2019-10-13 LAB — URIC ACID: Uric Acid, Serum: 10 mg/dL — ABNORMAL HIGH (ref 2.5–7.1)

## 2019-10-13 MED ORDER — ACETAMINOPHEN 325 MG PO TABS: 650.0000 mg | ORAL_TABLET | ORAL | Status: DC | PRN

## 2019-10-13 MED ORDER — MAGNESIUM SULFATE 2 GM/50ML IV SOLN
INTRAVENOUS | Status: AC
  Filled 2019-10-13: qty 50

## 2019-10-13 MED ORDER — ONDANSETRON 4 MG PO TBDP
4.0000 mg | ORAL_TABLET | Freq: Three times a day (TID) | ORAL | Status: DC | PRN
  Administered 2019-10-14: 4 mg via ORAL
  Filled 2019-10-13: qty 1
  Filled 2019-10-13: qty 2

## 2019-10-13 MED ORDER — SODIUM CHLORIDE 0.9 % IV SOLN: INTRAVENOUS | Status: DC

## 2019-10-13 MED ORDER — SODIUM CHLORIDE 0.9 % IV SOLN
Freq: Once | INTRAVENOUS | Status: AC
  Filled 2019-10-13: qty 250

## 2019-10-13 MED ORDER — ENOXAPARIN SODIUM 40 MG/0.4ML ~~LOC~~ SOLN
40.0000 mg | SUBCUTANEOUS | Status: AC
  Administered 2019-10-13 – 2019-10-16 (×4): 40 mg via SUBCUTANEOUS
  Filled 2019-10-13 (×4): qty 0.4

## 2019-10-13 MED ORDER — SENNOSIDES-DOCUSATE SODIUM 8.6-50 MG PO TABS: 1.0000 | ORAL_TABLET | Freq: Every evening | ORAL | Status: DC | PRN

## 2019-10-13 MED ORDER — ONDANSETRON HCL 4 MG/2ML IJ SOLN
4.0000 mg | Freq: Three times a day (TID) | INTRAMUSCULAR | Status: DC | PRN
  Administered 2019-10-15: 14:00:00 4 mg via INTRAVENOUS
  Filled 2019-10-13: qty 2

## 2019-10-13 MED ORDER — SODIUM CHLORIDE 0.9 % IV SOLN
INTRAVENOUS | Status: DC
  Filled 2019-10-13: qty 250

## 2019-10-13 MED ORDER — SODIUM CHLORIDE 0.9 % IV SOLN
INTRAVENOUS | Status: DC
  Filled 2019-10-13 (×3): qty 1000

## 2019-10-13 MED ORDER — FLUCONAZOLE IN SODIUM CHLORIDE 200-0.9 MG/100ML-% IV SOLN
200.0000 mg | INTRAVENOUS | Status: DC
  Administered 2019-10-13 – 2019-10-14 (×2): 200 mg via INTRAVENOUS
  Filled 2019-10-13 (×2): qty 100

## 2019-10-13 MED ORDER — LIDOCAINE-PRILOCAINE 2.5-2.5 % EX CREA: 1.0000 "application " | TOPICAL_CREAM | CUTANEOUS | Status: DC | PRN

## 2019-10-13 MED ORDER — SODIUM CHLORIDE 0.9 % IV SOLN
8.0000 mg | Freq: Three times a day (TID) | INTRAVENOUS | Status: DC | PRN
  Filled 2019-10-13: qty 4

## 2019-10-13 MED ORDER — ONDANSETRON HCL 4 MG PO TABS: 4.0000 mg | ORAL_TABLET | Freq: Three times a day (TID) | ORAL | Status: DC | PRN

## 2019-10-13 MED ORDER — MAGIC MOUTHWASH W/LIDOCAINE
10.0000 mL | Freq: Three times a day (TID) | ORAL | Status: DC
  Administered 2019-10-13 – 2019-10-17 (×12): 10 mL via ORAL
  Filled 2019-10-13 (×12): qty 10

## 2019-10-13 MED ORDER — FLUCONAZOLE 100MG IVPB
100.0000 mg | INTRAVENOUS | Status: DC
  Administered 2019-10-14 – 2019-10-19 (×6): 100 mg via INTRAVENOUS
  Filled 2019-10-13 (×6): qty 50

## 2019-10-13 MED ORDER — MAGNESIUM SULFATE 2 GM/50ML IV SOLN
2.0000 g | Freq: Once | INTRAVENOUS | Status: AC
  Administered 2019-10-13: 12:00:00 2 g via INTRAVENOUS

## 2019-10-13 NOTE — Evaluation (Signed)
SLP Cancellation Note  Patient Details Name: Audrey Gross MRN: [id number] DOB: December 31, 1960   Cancelled treatment:       Reason Eval/Treat Not Completed: Other (comment)(order for MBS received, will conduct next date as ordered to rule out oropharyngeal sensorimotor dysphagia and help mitigate dysphagia as able, thanks for the order)  Kathleen Lime, MS [facility] Office [phone]  Macario Golds 10/13/2019, 5:06 PM

## 2019-10-13 NOTE — Consult Note (Addendum)
Referring Provider:  [facility]         Primary Care Physician:  Janith Lima, MD Primary Gastroenterologist:   Oretha Caprice, MD           We were asked to see this patient for:    dysphagia              ASSESSMENT /  PLAN      59 yo female with PMH significant for chronic lymphoblastic leukemia, hyperlipidemia, hypertension and atrial fibrillation on Eliquis.   1. Odynophagia / dysphagia in setting of recent antibiotics, steroids / chemotherapy. Chest CT scan suggests mild non-specific esophageal wall thickening. Suspect oral / esophageal candida but viral etiologies such as HSV, CMV cannot be excluded --Trial of Magic mouthwash with lidocaine --IV Fluconazole. If no improvement over the weekend then may need EGD. Her Eliquis ( Afib) is currently on hold.   2. CLL, on chemotherapy. Chest /abd / pelvic CT scan >> decrase in size and number of pulmonary nodules, slight enlargement of a high right paratracheal lymph node. Doubt node large enough to be contributing to dysphagia.    HPI:    Chief Complaint: swallowing problems  Audrey Gross is a 59 y.o. female with CLL.  She has not Dr. Ardis Hughs, saw him in 2018 for evaluation of dysphagia.   Double contrast barium esophagram in 2018 normal with some limitations due to her inability to rapidly consume effervescent crystals and barium. More recently of seen her in the office for anal fissures.   Armiyah has been undergoing treatment for chronic lymphocytic leukemia with good response to treatment.  Chemotherapy has been on hold due to her inability to swallow lately.  Seen in the ED about a week ago for  evaluation of nausea, vomiting.  Covid test negative . Barnie Mort has been having problems swallowing.  Admitted by Oncology yesterday for severe dysphagia with associated dehydration, electrolyte abnormalities from poor intake.  Her potassium was 2.9.  White count low secondary to chemo therapy.   Iva says she began having pain with  swallowing about a week or so ago. Painful to even swallow water. Additionally any thing she eats gets stuck and she subsequently vomits it up. This is not like the dysphagia she had in 2018, it is much worse. Within the last few weeks she has had both steroids and antibiotics ( part of chemotherapy regimen). She has no other GI complaints   Past Medical History:  Diagnosis Date  . Atrial fibrillation ([place])   . CLL (chronic lymphoblastic leukemia)    Leukemia  . Cystic breast   . Depression   . Diffuse cystic mastopathy   . Dyslipidemia (high LDL; low HDL)   . Hypertension   . Iron deficiency anemia, unspecified   . Lymphadenopathy of head and neck 04/05/2015  . Medical non-compliance   . Mitral regurgitation 02/2017   moderate to severe  . Persistent atrial fibrillation with rapid ventricular response ([place]) 07/15/2017    Past Surgical History:  Procedure Laterality Date  . CARDIOVERSION N/A 04/02/2017   Procedure: CARDIOVERSION;  Surgeon: Dorothy Spark, MD;  Location: [facility] ENDOSCOPY;  Service: Cardiovascular;  Laterality: N/A;  . CARDIOVERSION N/A 08/19/2017   Procedure: CARDIOVERSION;  Surgeon: Larey Dresser, MD;  Location: [facility];  Service: Cardiovascular;  Laterality: N/A;  . IR IMAGING GUIDED PORT INSERTION  06/29/2018  . TUBAL LIGATION      Prior to Admission medications   Medication Sig Start Date End  Date Taking? Authorizing Provider  allopurinol (ZYLOPRIM) 300 MG tablet TAKE 1 TABLET BY MOUTH EVERY DAY 09/27/19  Yes Gorsuch, Ni, MD  benzonatate (TESSALON) 100 MG capsule Take 1-2 capsules (100-200 mg total) by mouth 3 (three) times daily as needed. Patient taking differently: Take 100-200 mg by mouth 3 (three) times daily as needed for cough.  10/07/19  Yes Jaynee Eagles, PA-C  Cholecalciferol (VITAMIN D-3) 1000 units CAPS Take 1,000 Units by mouth daily with breakfast.   Yes [provider]  Duvelisib (COPIKTRA) 25 MG CAPS Take 25 mg by mouth 2 (two) times  daily. Take with or without food, approximately 12 hours apart. 07/27/19  Yes Gorsuch, Ni, MD  furosemide (LASIX) 20 MG tablet TAKE 1 TABLET BY MOUTH EVERY DAY AS NEEDED Patient taking differently: Take 20 mg by mouth daily.  09/26/19  Yes Camnitz, Will Hassell Done, MD  lidocaine-prilocaine (EMLA) cream Apply 1 application topically as needed. Patient taking differently: Apply 1 application topically as needed (acess port).  06/04/18  Yes Gorsuch, Ni, MD  metoprolol succinate (TOPROL-XL) 100 MG 24 hr tablet TAKE 1 AND 1/2 TABLETS BY MOUTH EVERY DAY. TAKE WITH FOOD OR RIGHT AFTER A MEAL 08/05/19  Yes Camnitz, Will Hassell Done, MD  ondansetron (ZOFRAN-ODT) 8 MG disintegrating tablet Take 1 tablet (8 mg total) by mouth every 8 (eight) hours as needed for nausea or vomiting. 10/07/19  Yes Jaynee Eagles, PA-C  potassium chloride SA (K-DUR,KLOR-CON) 20 MEQ tablet Take 1 tablet (20 mEq total) by mouth daily. 11/02/18  Yes Heath Lark, MD  promethazine (PHENERGAN) 25 MG suppository Place 1 suppository (25 mg total) rectally every 6 (six) hours as needed for nausea or vomiting. 10/07/19  Yes Jaynee Eagles, PA-C  promethazine-dextromethorphan (PROMETHAZINE-DM) 6.25-15 MG/5ML syrup Take 5 mLs by mouth at bedtime as needed for cough. 10/07/19  Yes Jaynee Eagles, PA-C  sacubitril-valsartan (ENTRESTO) 49-51 MG Take 1 tablet by mouth 2 (two) times daily. 08/30/19  Yes Camnitz, Will Hassell Done, MD  sulfamethoxazole-trimethoprim (BACTRIM) 400-80 MG tablet Take 1 tablet by mouth daily. 08/11/19  Yes Gorsuch, Ni, MD  valGANciclovir (VALCYTE) 450 MG tablet Take 2 tablets (900 mg total) by mouth daily. 08/11/19  Yes Heath Lark, MD  ELIQUIS 5 MG TABS tablet TAKE 1 TABLET BY MOUTH TWICE A DAY 04/27/19   Camnitz, Ocie Doyne, MD    Current Facility-Administered Medications  Medication Dose Route Frequency Provider Last Rate Last Admin  . acetaminophen (TYLENOL) tablet 650 mg  650 mg Oral Q4H PRN Gorsuch, Ni, MD      . enoxaparin (LOVENOX) injection  40 mg  40 mg Subcutaneous Q24H Gorsuch, Ni, MD      . lidocaine-prilocaine (EMLA) cream 1 application  1 application Topical PRN Alvy Bimler, Ni, MD      . ondansetron (ZOFRAN) tablet 4-8 mg  4-8 mg Oral Q8H PRN Alvy Bimler, Ni, MD       Or  . ondansetron (ZOFRAN-ODT) disintegrating tablet 4-8 mg  4-8 mg Oral Q8H PRN Alvy Bimler, Ni, MD       Or  . ondansetron (ZOFRAN) injection 4 mg  4 mg Intravenous Q8H PRN Gorsuch, Ni, MD       Or  . ondansetron (ZOFRAN) 8 mg in sodium chloride 0.9 % 50 mL IVPB  8 mg Intravenous Q8H PRN Gorsuch, Ni, MD      . senna-docusate (Senokot-S) tablet 1 tablet  1 tablet Oral QHS PRN Gorsuch, Ni, MD      . sodium chloride 0.9 % 1,000 mL with potassium  chloride 40 mEq infusion   Intravenous Continuous Heath Lark, MD 50 mL/hr at 10/13/19 1550 New Bag at 10/13/19 1550    Allergies as of 10/13/2019  . (No Known Allergies)    Family History  Problem Relation Age of Onset  . Sudden death Mother   . Kidney disease Father        H/O HD and kidney transplant  . Stomach cancer Maternal Aunt   . Stomach cancer Paternal Grandmother   . Heart disease Brother   . Heart disease Maternal Grandmother     Social History   Socioeconomic History  . Marital status: Married    Spouse name: Not on file  . Number of children: 6  . Years of education: Not on file  . Highest education level: Not on file  Occupational History  . Occupation: [employer] manager: [employer]  Tobacco Use  . Smoking status: Never Smoker  . Smokeless tobacco: Never Used  Substance and Sexual Activity  . Alcohol use: No  . Drug use: No  . Sexual activity: Not Currently    Partners: Male    Birth control/protection: Surgical    Comment: tubal ligation  Other Topics Concern  . Not on file  Social History Narrative  . Not on file   Social Determinants of Health   Financial Resource Strain:   . Difficulty of Paying Living Expenses: Not on file  Food Insecurity:   . Worried About Charity fundraiser  in the Last Year: Not on file  . Ran Out of Food in the Last Year: Not on file  Transportation Needs:   . Lack of Transportation (Medical): Not on file  . Lack of Transportation (Non-Medical): Not on file  Physical Activity:   . Days of Exercise per Week: Not on file  . Minutes of Exercise per Session: Not on file  Stress:   . Feeling of Stress : Not on file  Social Connections:   . Frequency of Communication with Friends and Family: Not on file  . Frequency of Social Gatherings with Friends and Family: Not on file  . Attends Religious Services: Not on file  . Active Member of Clubs or Organizations: Not on file  . Attends Archivist Meetings: Not on file  . Marital Status: Not on file  Intimate Partner Violence:   . Fear of Current or Ex-Partner: Not on file  . Emotionally Abused: Not on file  . Physically Abused: Not on file  . Sexually Abused: Not on file    Review of Systems: All systems reviewed and negative except where noted in HPI.  Physical Exam: Vital signs in last 24 hours: Temp:  [98 F (36.7 C)-99.6 F (37.6 C)] 99.6 F (37.6 C) (01/07 1501) Pulse Rate:  [87-114] 96 (01/07 1448) Resp:  [16-17] 17 (01/07 1448) BP: (147-151)/(76-86) 151/86 (01/07 1448) SpO2:  [97 %-100 %] 97 % (01/07 1448) Weight:  [84.5 kg-85.1 kg] 85.1 kg (01/07 1448)   General:   Alert, well-developed,  female in NAD Psych:  Pleasant, cooperative. Normal mood and affect. Eyes:  Pupils equal, sclera clear, no icterus.   Conjunctiva pink. Ears:  Normal auditory acuity. Nose:  No deformity, discharge,  or lesions. Oral: thick white coating on tongue, some white particles at corners of her mouth.  Neck:  Supple; no masses Lungs:  Clear throughout to auscultation.   No wheezes, crackles, or rhonchi.  Heart:  Regular rate and rhythm;, no lower extremity edema Abdomen:  Soft, non-distended, nontender, BS active, no palp mass   Rectal:  Deferred  Msk:  Symmetrical without gross  deformities. . Neurologic:  Alert and  oriented x4;  grossly normal neurologically. Skin:  Intact without significant lesions or rashes.   Intake/Output from previous day: No intake/output data recorded. Intake/Output this shift: No intake/output data recorded.  Lab Results: Recent Labs    10/13/19 0843  WBC 2.4*  HGB 12.8  HCT 38.8  PLT 309   BMET Recent Labs    10/13/19 0843  NA 145  K 2.9*  CL 107  CO2 18*  GLUCOSE 97  BUN 12  CREATININE 0.86  CALCIUM 9.0   LFT Recent Labs    10/13/19 0843  PROT 6.9  ALBUMIN 3.9  AST 20  ALT 9  ALKPHOS 58  BILITOT 0.9   PT/INR No results for input(s): LABPROT, INR in the last 72 hours. Hepatitis Panel No results for input(s): HEPBSAG, HCVAB, HEPAIGM, HEPBIGM in the last 72 hours.   . CBC Latest Ref Rng & Units 10/13/2019 10/03/2019 09/06/2019  WBC 4.0 - 10.5 K/uL 2.4(L) 3.3(L) 3.6(L)  Hemoglobin 12.0 - 15.0 g/dL 12.8 13.1 12.9  Hematocrit 36.0 - 46.0 % 38.8 40.7 41.5  Platelets 150 - 400 K/uL 309 258 186    . CMP Latest Ref Rng & Units 10/13/2019 10/03/2019 09/06/2019  Glucose 70 - 99 mg/dL 97 117(H) 107(H)  BUN 6 - 20 mg/dL 12 9 11   Creatinine 0.44 - 1.00 mg/dL 0.86 0.81 0.82  Sodium 135 - 145 mmol/L 145 144 141  Potassium 3.5 - 5.1 mmol/L 2.9(LL) 4.1 4.2  Chloride 98 - 111 mmol/L 107 108 104  CO2 22 - 32 mmol/L 18(L) 25 27  Calcium 8.9 - 10.3 mg/dL 9.0 9.3 9.3  Total Protein 6.5 - 8.1 g/dL 6.9 6.8 6.4(L)  Total Bilirubin 0.3 - 1.2 mg/dL 0.9 0.8 0.8  Alkaline Phos 38 - 126 U/L 58 59 61  AST 15 - 41 U/L 20 13(L) 14(L)  ALT 0 - 44 U/L 9 9 15    Studies/Results: CT Chest W Contrast  Result Date: 10/12/2019 CLINICAL DATA:  CLL. New dysphagia. Ongoing chemotherapy pill. Cough with vomiting. EXAM: CT CHEST, ABDOMEN, AND PELVIS WITH CONTRAST TECHNIQUE: Multidetector CT imaging of the chest, abdomen and pelvis was performed following the standard protocol during bolus administration of intravenous contrast. CONTRAST:   164mL OMNIPAQUE IOHEXOL 300 MG/ML  SOLN COMPARISON:  07/22/2019. FINDINGS: CT CHEST FINDINGS Cardiovascular: Right IJ Port-A-Cath terminates in the high right atrium. Atherosclerotic calcification of the aorta and coronary arteries. Pulmonic trunk is enlarged. Heart is at the upper limits of normal in size to mildly enlarged. No pericardial effusion. Mediastinum/Nodes: High right paratracheal lymph node is enlarged slightly now measuring 7 mm (2/12) previously 4 mm. No additional pathologically enlarged mediastinal or hilar lymph nodes. Subpectoral and axillary lymph nodes have decreased in size in the interval. Index left axillary lymph node measures 11 mm (2/10), compared to 1.9 cm on 07/22/2019. There may be mild diffuse esophageal wall thickening. Lungs/Pleura: Minimal biapical pleuroparenchymal scarring. Decrease in size and number of bilateral pulmonary nodules. For example, a 3 mm nodule seen in the anterior segment right upper lobe on 07/22/2019 is no longer visualized. 12 mm medial right lower lobe nodule (4/79), decreased from 15 mm. No pleural fluid. Airway is unremarkable. Musculoskeletal: No worrisome lytic or sclerotic lesions. CT ABDOMEN PELVIS FINDINGS Hepatobiliary: Liver and gallbladder are unremarkable. No biliary ductal dilatation. Pancreas: Negative. Spleen: Negative. Adrenals/Urinary Tract:  Adrenal glands are unremarkable. Low-attenuation lesions in the right kidney measure up to 1.5 cm and are likely cysts. Kidneys are otherwise unremarkable. Ureters are decompressed. Bladder is low in volume. Stomach/Bowel: Stomach, small bowel, appendix and colon are unremarkable. Vascular/Lymphatic: Atherosclerotic calcification of the aorta without aneurysm. Retroperitoneal lymph nodes have decreased in size in the interval. Index left periaortic lymph node measures 6 mm (2/76), previously 10 mm. Abdominal peritoneal ligament lymph nodes are not enlarged by CT size criteria, similar to the prior exam.  Mesenteric lymph nodes have decreased in size. Index lymph node near the root of the small bowel mesentery measures 4 mm (2/66), previously 7 mm. Pelvic retroperitoneal lymph nodes have decreased in size in the interval as well. Index left external iliac lymph node measures 10 mm (2/104), previously 1.6 cm. Inguinal lymph nodes are not enlarged by CT size criteria. Reproductive: Uterus is visualized.  No adnexal mass. Other: Mild laxity of the ventral abdominal wall at the umbilicus. No free fluid. Mesenteries and peritoneum are otherwise unremarkable. Musculoskeletal: No worrisome lytic or sclerotic lesions. Mild L1 compression deformity, unchanged. IMPRESSION: 1. Slight enlargement of a high right paratracheal lymph node. Otherwise, lymph nodes in the axillary regions, abdomen and pelvis have decreased in size in the interval, consistent with treatment response. 2. Interval decrease in size and number of pulmonary nodules, indicative of metastatic disease. 3. There may be mild nonspecific diffuse esophageal wall thickening. 4. Aortic atherosclerosis (ICD10-I70.0). Coronary artery calcification. 5. Enlarged pulmonic trunk, indicative of arterial hypertension. Electronically Signed   By: Lorin Picket M.D.   On: 10/12/2019 13:05   CT Abdomen Pelvis W Contrast  Result Date: 10/12/2019 CLINICAL DATA:  CLL. New dysphagia. Ongoing chemotherapy pill. Cough with vomiting. EXAM: CT CHEST, ABDOMEN, AND PELVIS WITH CONTRAST TECHNIQUE: Multidetector CT imaging of the chest, abdomen and pelvis was performed following the standard protocol during bolus administration of intravenous contrast. CONTRAST:  184mL OMNIPAQUE IOHEXOL 300 MG/ML  SOLN COMPARISON:  07/22/2019. FINDINGS: CT CHEST FINDINGS Cardiovascular: Right IJ Port-A-Cath terminates in the high right atrium. Atherosclerotic calcification of the aorta and coronary arteries. Pulmonic trunk is enlarged. Heart is at the upper limits of normal in size to mildly  enlarged. No pericardial effusion. Mediastinum/Nodes: High right paratracheal lymph node is enlarged slightly now measuring 7 mm (2/12) previously 4 mm. No additional pathologically enlarged mediastinal or hilar lymph nodes. Subpectoral and axillary lymph nodes have decreased in size in the interval. Index left axillary lymph node measures 11 mm (2/10), compared to 1.9 cm on 07/22/2019. There may be mild diffuse esophageal wall thickening. Lungs/Pleura: Minimal biapical pleuroparenchymal scarring. Decrease in size and number of bilateral pulmonary nodules. For example, a 3 mm nodule seen in the anterior segment right upper lobe on 07/22/2019 is no longer visualized. 12 mm medial right lower lobe nodule (4/79), decreased from 15 mm. No pleural fluid. Airway is unremarkable. Musculoskeletal: No worrisome lytic or sclerotic lesions. CT ABDOMEN PELVIS FINDINGS Hepatobiliary: Liver and gallbladder are unremarkable. No biliary ductal dilatation. Pancreas: Negative. Spleen: Negative. Adrenals/Urinary Tract: Adrenal glands are unremarkable. Low-attenuation lesions in the right kidney measure up to 1.5 cm and are likely cysts. Kidneys are otherwise unremarkable. Ureters are decompressed. Bladder is low in volume. Stomach/Bowel: Stomach, small bowel, appendix and colon are unremarkable. Vascular/Lymphatic: Atherosclerotic calcification of the aorta without aneurysm. Retroperitoneal lymph nodes have decreased in size in the interval. Index left periaortic lymph node measures 6 mm (2/76), previously 10 mm. Abdominal peritoneal ligament lymph nodes are not enlarged  by CT size criteria, similar to the prior exam. Mesenteric lymph nodes have decreased in size. Index lymph node near the root of the small bowel mesentery measures 4 mm (2/66), previously 7 mm. Pelvic retroperitoneal lymph nodes have decreased in size in the interval as well. Index left external iliac lymph node measures 10 mm (2/104), previously 1.6 cm. Inguinal  lymph nodes are not enlarged by CT size criteria. Reproductive: Uterus is visualized.  No adnexal mass. Other: Mild laxity of the ventral abdominal wall at the umbilicus. No free fluid. Mesenteries and peritoneum are otherwise unremarkable. Musculoskeletal: No worrisome lytic or sclerotic lesions. Mild L1 compression deformity, unchanged. IMPRESSION: 1. Slight enlargement of a high right paratracheal lymph node. Otherwise, lymph nodes in the axillary regions, abdomen and pelvis have decreased in size in the interval, consistent with treatment response. 2. Interval decrease in size and number of pulmonary nodules, indicative of metastatic disease. 3. There may be mild nonspecific diffuse esophageal wall thickening. 4. Aortic atherosclerosis (ICD10-I70.0). Coronary artery calcification. 5. Enlarged pulmonic trunk, indicative of arterial hypertension. Electronically Signed   By: Lorin Picket M.D.   On: 10/12/2019 13:05    Active Problems:   CLL (chronic lymphocytic leukemia) ([place])   Dysphagia   Dehydration    Tye Savoy, NP-C @  10/13/2019, 4:06 PM   I have reviewed the entire case in detail with the above APP and discussed the plan in detail.  Therefore, I agree with the diagnoses recorded above. In addition,  I have personally interviewed and examined the patient.  My additional thoughts are as follows:  Suspected pharyngeal esophageal candidiasis, also possible viral infection such as HSV or CMV. Obstruction/neoplasia seems less likely given relative acuity of onset within the last 1 to 2 weeks.  Fluconazole treatment as outlined above and Magic mouthwash to relieve symptoms. I expect she will be here several days as symptoms improve when she gets IV fluid support and electrolyte repletion. Start with liquid protein calorie supplements when she is able to swallow with less pain on the above therapy.  If she does not get better in the next few days as expected, next plan will be upper  endoscopy.  Dr. Henrene Pastor to follow patient while hospitalized, then outpatient follow-up as needed with her primary GI, Dr. Oretha Caprice.   Nelida Meuse III Office:[phone]

## 2019-10-13 NOTE — H&P (Signed)
[facility] ADMISSION NOTE  Patient Care Team: Janith Lima, MD as PCP - General (Internal Medicine) Constance Haw, MD as PCP - Electrophysiology (Cardiology) Constance Haw, MD as Consulting Physician (Cardiology)  ASSESSMENT & PLAN:  Chronic lymphocytic leukemia Overall, she tolerated chemotherapy well She had positive response to treatment The small lymphadenopathy seen in her CT scan of the chest is not significant enough to have caused her symptoms She has not been able to take chemotherapy due to inability to swallow Continue supportive care for now  Severe dysphagia She had near 0 oral intake of food and water over the past week She could not swallow any of her pills for over a week The patient is grossly dehydrated.  Despite 2 L of IV fluid in the outpatient clinic, she is oliguric She needs urgent upper GI evaluation and swallow study She agreed to be admitted for this I will consult GI service for evaluation I will consult speech and language therapist for assessment of swallowing with barium study I will continue aggressive IV fluid resuscitation in the meantime  Severe hypokalemia This is due to poor oral intake Her magnesium level is low I have ordered 2 g of IV magnesium sulfate replacement in the outpatient office I will order IV potassium replacement therapy while she is hospitalized  Mild pancytopenia This is due to her recent treatment She is not symptomatic Observe for now  Covid testing She is not symptomatic Her recent Covid testing on October 07, 2019 is negative We will submit another repeat Covid testing while she is in the hospital  DVT prophylaxis She was taking Eliquis in the past due to chronic atrial fibrillation She has not swallow Eliquis over the past week I will just start her on DVT prophylaxis with Lovenox while she is hospitalized  CODE STATUS Full code  Goals of care discussion Resolution of  dysphagia.  She needs further work-up.  She also grossly dehydrated requiring aggressive IV fluid resuscitation.  She needs inpatient hospitalization because she has failed outpatient therapy.  The risk of not admitting her with the risk of death  Discharge planning I am hopeful we can discharge her over the next 2 to 3 days once her dysphagia resolved and the cost can be found  All questions were answered. The patient knows to call the clinic with any problems, questions or concerns. The total time spent in the appointment was 80 minutes encounter with patients including review of chart and various tests results, discussions about plan of care and coordination of care plan.    Heath Lark, MD 10/13/2019 8:53 AM  CHIEF COMPLAINTS/PURPOSE OF ADMISSION Severe dysphagia, dehydration, weight loss, inability to take substantial oral intake for more than a week  HISTORY OF PRESENTING ILLNESS:  Audrey Gross 59 y.o. female is admitted for further management of symptoms above She was a direct admission from my office She returns today to review CT imaging results for evaluation of severe dysphagia and treatment response of CLL  Her dysphagia is worse since last time I saw her She is now not able to swallow any pills and able to only drink a quarter of a cup of water She felt extremely dehydrated She went to the emergency department 6 days ago for similar evaluation and was subsequently discharged She felt weak and dizzy She denies pain She denies nausea.  Due to poor oral intake, she is not able to have bowel movement either for 1 week She  denies recent infection, fever or chills. She has lost more than 10 pounds in a week She was given 2 liters of normal saline and 2 g of IV magnesium in the infusion room at the Cancer center. She has no urine output. She was evaluated 3 times in the office prior to admission  Summary of oncologic history as follows: Oncology History Overview Note  17p  positive   CLL (chronic lymphocytic leukemia) ([place])  04/05/2015 Pathology Results   Accession: [id number] flow cytometry confirmed CLL. FISH was positive for p53 mutation   04/24/2015 Imaging   Extensive lymphadenopathy throughout the neck, chest (axilla), abdomen and pelvis, as detailed above, compatible with the reported clinical history of lymphoma. 2. Mild splenomegaly.    05/03/2015 - 08/27/2015 Chemotherapy   She started on Ibrutinib, discontinued prematurely when her prescription ran out   10/10/2015 - 05/29/2017 Chemotherapy   She was restarted back on Ibrutinib   11/13/2016 PET scan   Significant generalized reduction in size of numerous lymph nodes in the neck, chest, abdomen, and pelvis. Previously the activity of these nodes was low-level and in general a similar low-level activity is present today, significantly less than mediastinal blood pool activity, compatible with Deauville 2. 2. Coronary atherosclerosis.  Mild cardiomegaly. 3. Mildly prominent endometrium for age without accentuated metabolic activity in the endometrium. Consider pelvic sonography for further characterization.   05/27/2017 PET scan   1. Progressive hypermetabolic adenopathy, primarily involving cervical, axillary, pelvic and inguinal lymph nodes bilaterally, consistent with progressive lymphoma. 2. No solid visceral organ or osseous involvement.   06/16/2017 - 08/25/2017 Chemotherapy   The patient had 3 cycles of Rituximab and Bendamustine   07/15/2017 - 07/19/2017 Hospital Admission   The patient was briefly admitted to the hospital due to infusion reaction to rituximab   09/18/2017 PET scan   1. Continued considerable adenopathy in the neck, chest, abdomen, and pelvis. This is generally stable in size but mildly reduced in activity compared to the prior exam. Current levels of activity primarily Deauville 3 and Deauville 4. No splenomegaly. 2. Diffuse new ground-glass opacities in the lungs with associated  hypermetabolic activity. Some forms of lymphoma infiltration can rarely cause this pattern of diffuse ground-glass opacity and hypermetabolic activity. Differential diagnostic considerations might include atypical pneumonia such as mycoplasma, acute hypersensitivity pneumonitis, or acute eosinophilic pneumonia. Pulmonary hemorrhage seems less likely to cause this degree of accentuated metabolic activity.  3.  Aortic Atherosclerosis (ICD10-I70.0).  Coronary atherosclerosis.   09/24/2017 -  Chemotherapy   The patient had ventoclax for chemotherapy treatment.  Rituximab is added on 11/18/17 to 04/09/18, x 6 cycles   11/19/2017 PET scan   Overall mild interval decrease in hypermetabolic lymphadenopathy throughout the neck, chest, abdomen, and pelvis. No new or increased lymphadenopathy identified.  While there has been resolution of diffuse hypermetabolic bilateral ground-glass pulmonary opacity since prior study, there is a new 14 mm hypermetabolic pulmonary nodule in the posterior right lower lobe. Time course favors inflammatory or infectious etiology over neoplasm. Recommend continued follow-up by chest CT in 3 months.   02/16/2018 PET scan   1. Adenopathy in the neck, chest, and pelvis is stable to minimally reduced in size, and is moderately reduced in activity, primarily Deauville 2 disease today. There is a right common iliac lymph node qualifying as Deauville 3 disease which is stable in size but reduced in activity. 2. Stable size but reduced activity in a pulmonary nodule in the right lower lobe. If this represents a leukemic  lesion then a corresponds to Deauville 4 disease. This lesion was not readily apparent on 09/22/2017 but was reported on the prior chest CT of 11/19/2017. 3. Other imaging findings of potential clinical significance: Mild thyroid goiter. Aortic Atherosclerosis (ICD10-I70.0). Coronary atherosclerosis. Mild cardiomegaly.   04/19/2018 PET scan   1. Interval mild mixed metabolic  changes, as detailed. Persistent mildly hypermetabolic bilateral axillary, mediastinal and bilateral pelvic adenopathy and mildly hypermetabolic right lower lobe pulmonary nodule compatible with lymphoproliferative disorder. Deauville 4 based on the subcarinal node. 2.  Aortic Atherosclerosis (ICD10-I70.0).   06/29/2018 Procedure   Successful placement of a right internal jugular approach power injectable Port-A-Cath. The catheter is ready for immediate use.   07/26/2018 PET scan   1. Adenopathy primarily in the chest and pelvis as noted above, with size of the mildly enlarged lymph nodes stable to minimally increased, but with nodal activity generally decreased compared to the prior exam. Uptake in these nodes is primarily Deauville 2. 2. Aortic Atherosclerosis (ICD10-I70.0). Coronary atherosclerosis with mild cardiomegaly.   10/29/2018 PET scan   1. Relatively stable sized lymphadenopathy but interval increase in hypermetabolism when compared to the most recent prior PET-CT, as detailed above. 2. No new disease is identified. 3. Stable bilateral pulmonary nodules.   11/12/2018 - 02/02/2019 Chemotherapy   The patient had riTUXimab (RITUXAN) 800 mg in sodium chloride 0.9 % 170 mL infusion, 375 mg/m2 = 800 mg, Intravenous,  Once, 3 of 4 cycles Administration: 800 mg (11/12/2018), 800 mg (12/10/2018), 800 mg (01/06/2019)  for chemotherapy treatment.    01/11/2019 Imaging   1. Significant increased adenopathy in the chest, abdomen, and pelvis compared to the 10/28/2018 PET-CT. 2. The scattered tiny pulmonary nodules and larger single right lower lobe pulmonary nodule are all stable. 3. Other imaging findings of potential clinical significance: Aortic Atherosclerosis (ICD10-I70.0). Coronary atherosclerosis. Mild cardiomegaly. Mild diffuse thyroid prominence, stable. Mild left foraminal impingement at L4-5.   01/21/2019 Procedure   Successful ultrasound-guided core biopsies of an enlarged right axillary  lymph node.     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 The latter is important because it essentially rules out the possibility of mantle cell lymphoma. CD3 highlights a small population of T-cells. By flow cytometry, a kappa-restricted monoclonal B-cell population that expresses CD19, CD20, CD5, and CD23 comprises 89% of all lymphocytes. Overall, the features are consistent with chronic lymphocytic leukemia/small lymphocytic lymphoma.   01/31/2019 Echocardiogram    1. The left ventricle has normal systolic function, with an ejection fraction of 55-60%. The cavity size was normal. Left ventricular diastolic Doppler parameters are consistent with impaired relaxation. No evidence of left ventricular regional wall motion abnormalities. GLS -20%.  2. The right ventricle has normal systolic function. The cavity was normal. There is no increase in right ventricular wall thickness.  3. The aortic valve is tricuspid. Mild calcification of the aortic valve. No stenosis of the aortic valve.  4. The aortic root is normal in size and structure.  5. 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  6. No evidence of mitral valve stenosis. No significant mitral regurgitation.  7. Normal IVC size. No complete TR doppler jet so unable to estimate PA systolic pressure.   02/03/2019 - 07/24/2019 Chemotherapy  The patient had obinutuzumab and Acalabrutinib for chemotherapy treatment.    05/02/2019 Imaging   1. Interval decrease in size axillary and mediastinal adenopathy. 2. There are a few retroperitoneal lymph nodes which are mildly increased in size. The majority of the  retroperitoneal and pelvic lymph nodes are grossly similar when compared to prior exam. 3. Stable scattered pulmonary nodules.   07/22/2019 Imaging   1. Progressive disease within the neck, chest, abdomen, and pelvis, as evidenced by increase in adenopathy. 2. Similar bilateral pulmonary nodules. 3. Extensive right-sided colonic pneumatosis and small volume extracolonic intraperitoneal gas. Correlate with abdominal symptoms. Of note, pneumatosis has been described in patients on immunotherapy. 4. Age advanced coronary artery atherosclerosis. Recommend assessment of coronary risk factors and consideration of medical therapy. 5.  Aortic Atherosclerosis (ICD10-I70.0). 6. Similar mild L1 compression deformity. 7. Trace free pelvic fluid, similar. 8. Pulmonary artery enlargement suggests pulmonary arterial hypertension.   08/11/2019 -  Chemotherapy   The patient had duvelisib for chemotherapy treatment.     10/12/2019 Imaging   1. Slight enlargement of a high right paratracheal lymph node. Otherwise, lymph nodes in the axillary regions, abdomen and pelvis have decreased in size in the interval, consistent with treatment response. 2. Interval decrease in size and number of pulmonary nodules, indicative of metastatic disease. 3. There may be mild nonspecific diffuse esophageal wall thickening. 4. Aortic atherosclerosis (ICD10-I70.0). Coronary artery calcification. 5. Enlarged pulmonic trunk, indicative of arterial hypertension.     MEDICAL HISTORY:  Past Medical History:  Diagnosis Date  . Atrial fibrillation ([place])   . CLL (chronic lymphoblastic leukemia)    Leukemia  . Cystic breast   . Depression   . Diffuse cystic mastopathy   . Dyslipidemia (high LDL; low HDL)   . Hypertension   . Iron deficiency anemia, unspecified   . Lymphadenopathy of head and neck 04/05/2015  . Medical non-compliance   . Mitral regurgitation 02/2017   moderate to severe  . Persistent atrial fibrillation with  rapid ventricular response ([place]) 07/15/2017    SURGICAL HISTORY: Past Surgical History:  Procedure Laterality Date  . CARDIOVERSION N/A 04/02/2017   Procedure: CARDIOVERSION;  Surgeon: Dorothy Spark, MD;  Location: [facility] ENDOSCOPY;  Service: Cardiovascular;  Laterality: N/A;  . CARDIOVERSION N/A 08/19/2017   Procedure: CARDIOVERSION;  Surgeon: Larey Dresser, MD;  Location: [facility];  Service: Cardiovascular;  Laterality: N/A;  . IR IMAGING GUIDED PORT INSERTION  06/29/2018  . TUBAL LIGATION      SOCIAL HISTORY: Social History   Socioeconomic History  . Marital status: Married    Spouse name: Not on file  . Number of children: 6  . Years of education: Not on file  . Highest education level: Not on file  Occupational History  . Occupation: [employer] manager: [employer]  Tobacco Use  . Smoking status: Never Smoker  . Smokeless tobacco: Never Used  Substance and Sexual Activity  . Alcohol use: No  . Drug use: No  . Sexual activity: Not Currently    Partners: Male    Birth control/protection: Surgical    Comment: tubal ligation  Other Topics Concern  . Not on file  Social History Narrative  . Not on file   Social Determinants of Health   Financial Resource Strain:   . Difficulty of Paying Living Expenses: Not on file  Food Insecurity:   . Worried About Charity fundraiser in the Last Year: Not on file  . Ran Out of Food  in the Last Year: Not on file  Transportation Needs:   . Lack of Transportation (Medical): Not on file  . Lack of Transportation (Non-Medical): Not on file  Physical Activity:   . Days of Exercise per Week: Not on file  . Minutes of Exercise per Session: Not on file  Stress:   . Feeling of Stress : Not on file  Social Connections:   . Frequency of Communication with Friends and Family: Not on file  . Frequency of Social Gatherings with Friends and Family: Not on file  . Attends Religious Services: Not on file  . Active Member of Clubs or  Organizations: Not on file  . Attends Archivist Meetings: Not on file  . Marital Status: Not on file  Intimate Partner Violence:   . Fear of Current or Ex-Partner: Not on file  . Emotionally Abused: Not on file  . Physically Abused: Not on file  . Sexually Abused: Not on file    FAMILY HISTORY: Family History  Problem Relation Age of Onset  . Sudden death Mother   . Kidney disease Father        H/O HD and kidney transplant  . Stomach cancer Maternal Aunt   . Stomach cancer Paternal Grandmother   . Heart disease Brother   . Heart disease Maternal Grandmother     ALLERGIES:  has No Known Allergies.  MEDICATIONS:  No current facility-administered medications for this encounter.   Current Outpatient Medications  Medication Sig Dispense Refill  . allopurinol (ZYLOPRIM) 300 MG tablet TAKE 1 TABLET BY MOUTH EVERY DAY 90 tablet 1  . benzonatate (TESSALON) 100 MG capsule Take 1-2 capsules (100-200 mg total) by mouth 3 (three) times daily as needed. 60 capsule 0  . Cholecalciferol (VITAMIN D-3) 1000 units CAPS Take 1,000 Units by mouth daily with breakfast.    . Duvelisib (COPIKTRA) 25 MG CAPS Take 25 mg by mouth 2 (two) times daily. Take with or without food, approximately 12 hours apart. 60 capsule 11  . ELIQUIS 5 MG TABS tablet TAKE 1 TABLET BY MOUTH TWICE A DAY 180 tablet 1  . furosemide (LASIX) 20 MG tablet TAKE 1 TABLET BY MOUTH EVERY DAY AS NEEDED 90 tablet 0  . lidocaine-prilocaine (EMLA) cream Apply 1 application topically as needed. 30 g 6  . metoprolol succinate (TOPROL-XL) 100 MG 24 hr tablet TAKE 1 AND 1/2 TABLETS BY MOUTH EVERY DAY. TAKE WITH FOOD OR RIGHT AFTER A MEAL 135 tablet 2  . ondansetron (ZOFRAN-ODT) 8 MG disintegrating tablet Take 1 tablet (8 mg total) by mouth every 8 (eight) hours as needed for nausea or vomiting. 20 tablet 0  . potassium chloride SA (K-DUR,KLOR-CON) 20 MEQ tablet Take 1 tablet (20 mEq total) by mouth daily. 30 tablet 11  .  promethazine (PHENERGAN) 25 MG suppository Place 1 suppository (25 mg total) rectally every 6 (six) hours as needed for nausea or vomiting. 15 each 0  . promethazine-dextromethorphan (PROMETHAZINE-DM) 6.25-15 MG/5ML syrup Take 5 mLs by mouth at bedtime as needed for cough. 100 mL 0  . sacubitril-valsartan (ENTRESTO) 49-51 MG Take 1 tablet by mouth 2 (two) times daily. 60 tablet 3  . sulfamethoxazole-trimethoprim (BACTRIM) 400-80 MG tablet Take 1 tablet by mouth daily. 30 tablet 6  . valGANciclovir (VALCYTE) 450 MG tablet Take 2 tablets (900 mg total) by mouth daily. 60 tablet 6   Facility-Administered Medications Ordered in Other Encounters  Medication Dose Route Frequency Provider Last Rate Last Admin  . 0.9 %  sodium chloride infusion   Intravenous Once Heath Lark, MD        REVIEW OF SYSTEMS:   Constitutional: Denies fevers, chills or abnormal night sweats Eyes: Denies blurriness of vision, double vision or watery eyes Ears, nose, mouth, throat, and face: Denies mucositis or sore throat Respiratory: Denies cough, dyspnea or wheezes Cardiovascular: Denies palpitation, chest discomfort or lower extremity swelling Skin: Denies abnormal skin rashes Lymphatics: Denies new lymphadenopathy or easy bruising Behavioral/Psych: Mood is stable, no new changes  All other systems were reviewed with the patient and are negative.  PHYSICAL EXAMINATION: ECOG PERFORMANCE STATUS: 1 - Symptomatic but completely ambulatory Blood pressure 148/86 Oxygen saturation 100% respiration rate 18 Temperature 98 Heart rate 114 GENERAL:alert, no distress and comfortable, she looks dehydrated with dry mucous membrane SKIN: skin color, texture, turgor are normal, no rashes or significant lesions EYES: normal, conjunctiva are pink and non-injected, sclera clear OROPHARYNX:no exudate, no erythema and lips, buccal mucosa, and tongue normal.  Dry mucous membrane is noted NECK: supple, thyroid normal size, non-tender,  without nodularity LYMPH:  no palpable lymphadenopathy in the cervical, axillary or inguinal LUNGS: clear to auscultation and percussion with normal breathing effort HEART: regular rate & rhythm and no murmurs and no lower extremity edema ABDOMEN:abdomen soft, non-tender and normal bowel sounds Musculoskeletal:no cyanosis of digits and no clubbing  PSYCH: alert & oriented x 3 with fluent speech NEURO: no focal motor/sensory deficits  LABORATORY DATA:  I have reviewed the data as listed Lab Results  Component Value Date   WBC 3.3 (L) 10/03/2019   HGB 13.1 10/03/2019   HCT 40.7 10/03/2019   MCV 90.4 10/03/2019   PLT 258 10/03/2019   Recent Labs    08/31/19 1145 09/06/19 1147 10/03/19 1238  NA 142 141 144  K 4.1 4.2 4.1  CL 106 104 108  CO2 _0 GLUCOSE 88 107* 117*  BUN _1 CREATININE 0.75 0.82 0.81  CALCIUM 9.0 9.3 9.3  GFRNONAA >60 >60 >60  GFRAA >60 >60 >60  PROT 6.2* 6.4* 6.8  ALBUMIN 3.9 3.9 3.8  AST 13* 14* 13*  ALT _2 ALKPHOS 55 61 59  BILITOT 1.1 0.8 0.8    RADIOGRAPHIC STUDIES: I have personally reviewed the radiological images as listed and agreed with the findings in the report. CT Chest W Contrast  Result Date: 10/12/2019 CLINICAL DATA:  CLL. New dysphagia. Ongoing chemotherapy pill. Cough with vomiting. EXAM: CT CHEST, ABDOMEN, AND PELVIS WITH CONTRAST TECHNIQUE: Multidetector CT imaging of the chest, abdomen and pelvis was performed following the standard protocol during bolus administration of intravenous contrast. CONTRAST:  123m OMNIPAQUE IOHEXOL 300 MG/ML  SOLN COMPARISON:  07/22/2019. FINDINGS: CT CHEST FINDINGS Cardiovascular: Right IJ Port-A-Cath terminates in the high right atrium. Atherosclerotic calcification of the aorta and coronary arteries. Pulmonic trunk is enlarged. Heart is at the upper limits of normal in size to mildly enlarged. No pericardial effusion. Mediastinum/Nodes: High right paratracheal lymph node is enlarged  slightly now measuring 7 mm (2/12) previously 4 mm. No additional pathologically enlarged mediastinal or hilar lymph nodes. Subpectoral and axillary lymph nodes have decreased in size in the interval. Index left axillary lymph node measures 11 mm (2/10), compared to 1.9 cm on 07/22/2019. There may be mild diffuse esophageal wall thickening. Lungs/Pleura: Minimal biapical pleuroparenchymal scarring. Decrease in size and number of bilateral pulmonary nodules. For example, a 3 mm nodule seen in the anterior segment right upper lobe on 07/22/2019 is  no longer visualized. 12 mm medial right lower lobe nodule (4/79), decreased from 15 mm. No pleural fluid. Airway is unremarkable. Musculoskeletal: No worrisome lytic or sclerotic lesions. CT ABDOMEN PELVIS FINDINGS Hepatobiliary: Liver and gallbladder are unremarkable. No biliary ductal dilatation. Pancreas: Negative. Spleen: Negative. Adrenals/Urinary Tract: Adrenal glands are unremarkable. Low-attenuation lesions in the right kidney measure up to 1.5 cm and are likely cysts. Kidneys are otherwise unremarkable. Ureters are decompressed. Bladder is low in volume. Stomach/Bowel: Stomach, small bowel, appendix and colon are unremarkable. Vascular/Lymphatic: Atherosclerotic calcification of the aorta without aneurysm. Retroperitoneal lymph nodes have decreased in size in the interval. Index left periaortic lymph node measures 6 mm (2/76), previously 10 mm. Abdominal peritoneal ligament lymph nodes are not enlarged by CT size criteria, similar to the prior exam. Mesenteric lymph nodes have decreased in size. Index lymph node near the root of the small bowel mesentery measures 4 mm (2/66), previously 7 mm. Pelvic retroperitoneal lymph nodes have decreased in size in the interval as well. Index left external iliac lymph node measures 10 mm (2/104), previously 1.6 cm. Inguinal lymph nodes are not enlarged by CT size criteria. Reproductive: Uterus is visualized.  No adnexal mass.  Other: Mild laxity of the ventral abdominal wall at the umbilicus. No free fluid. Mesenteries and peritoneum are otherwise unremarkable. Musculoskeletal: No worrisome lytic or sclerotic lesions. Mild L1 compression deformity, unchanged. IMPRESSION: 1. Slight enlargement of a high right paratracheal lymph node. Otherwise, lymph nodes in the axillary regions, abdomen and pelvis have decreased in size in the interval, consistent with treatment response. 2. Interval decrease in size and number of pulmonary nodules, indicative of metastatic disease. 3. There may be mild nonspecific diffuse esophageal wall thickening. 4. Aortic atherosclerosis (ICD10-I70.0). Coronary artery calcification. 5. Enlarged pulmonic trunk, indicative of arterial hypertension. Electronically Signed   By: Lorin Picket M.D.   On: 10/12/2019 13:05   CT Abdomen Pelvis W Contrast  Result Date: 10/12/2019 CLINICAL DATA:  CLL. New dysphagia. Ongoing chemotherapy pill. Cough with vomiting. EXAM: CT CHEST, ABDOMEN, AND PELVIS WITH CONTRAST TECHNIQUE: Multidetector CT imaging of the chest, abdomen and pelvis was performed following the standard protocol during bolus administration of intravenous contrast. CONTRAST:  127m OMNIPAQUE IOHEXOL 300 MG/ML  SOLN COMPARISON:  07/22/2019. FINDINGS: CT CHEST FINDINGS Cardiovascular: Right IJ Port-A-Cath terminates in the high right atrium. Atherosclerotic calcification of the aorta and coronary arteries. Pulmonic trunk is enlarged. Heart is at the upper limits of normal in size to mildly enlarged. No pericardial effusion. Mediastinum/Nodes: High right paratracheal lymph node is enlarged slightly now measuring 7 mm (2/12) previously 4 mm. No additional pathologically enlarged mediastinal or hilar lymph nodes. Subpectoral and axillary lymph nodes have decreased in size in the interval. Index left axillary lymph node measures 11 mm (2/10), compared to 1.9 cm on 07/22/2019. There may be mild diffuse esophageal  wall thickening. Lungs/Pleura: Minimal biapical pleuroparenchymal scarring. Decrease in size and number of bilateral pulmonary nodules. For example, a 3 mm nodule seen in the anterior segment right upper lobe on 07/22/2019 is no longer visualized. 12 mm medial right lower lobe nodule (4/79), decreased from 15 mm. No pleural fluid. Airway is unremarkable. Musculoskeletal: No worrisome lytic or sclerotic lesions. CT ABDOMEN PELVIS FINDINGS Hepatobiliary: Liver and gallbladder are unremarkable. No biliary ductal dilatation. Pancreas: Negative. Spleen: Negative. Adrenals/Urinary Tract: Adrenal glands are unremarkable. Low-attenuation lesions in the right kidney measure up to 1.5 cm and are likely cysts. Kidneys are otherwise unremarkable. Ureters are decompressed. Bladder is low in  volume. Stomach/Bowel: Stomach, small bowel, appendix and colon are unremarkable. Vascular/Lymphatic: Atherosclerotic calcification of the aorta without aneurysm. Retroperitoneal lymph nodes have decreased in size in the interval. Index left periaortic lymph node measures 6 mm (2/76), previously 10 mm. Abdominal peritoneal ligament lymph nodes are not enlarged by CT size criteria, similar to the prior exam. Mesenteric lymph nodes have decreased in size. Index lymph node near the root of the small bowel mesentery measures 4 mm (2/66), previously 7 mm. Pelvic retroperitoneal lymph nodes have decreased in size in the interval as well. Index left external iliac lymph node measures 10 mm (2/104), previously 1.6 cm. Inguinal lymph nodes are not enlarged by CT size criteria. Reproductive: Uterus is visualized.  No adnexal mass. Other: Mild laxity of the ventral abdominal wall at the umbilicus. No free fluid. Mesenteries and peritoneum are otherwise unremarkable. Musculoskeletal: No worrisome lytic or sclerotic lesions. Mild L1 compression deformity, unchanged. IMPRESSION: 1. Slight enlargement of a high right paratracheal lymph node. Otherwise,  lymph nodes in the axillary regions, abdomen and pelvis have decreased in size in the interval, consistent with treatment response. 2. Interval decrease in size and number of pulmonary nodules, indicative of metastatic disease. 3. There may be mild nonspecific diffuse esophageal wall thickening. 4. Aortic atherosclerosis (ICD10-I70.0). Coronary artery calcification. 5. Enlarged pulmonic trunk, indicative of arterial hypertension. Electronically Signed   By: Lorin Picket M.D.   On: 10/12/2019 13:05

## 2019-10-13 NOTE — Progress Notes (Signed)
CRITICAL VALUE STICKER  CRITICAL VALUE: Potassium 2.9  RECEIVER (on-site recipient of call):Liz Loucille Takach, RN   DATE & TIME NOTIFIED: 10/13/2019 @ 953am  MD NOTIFIED: Dr. Alvy Bimler

## 2019-10-13 NOTE — Progress Notes (Signed)
This encounter was created in error - please disregard.

## 2019-10-14 ENCOUNTER — Encounter: Payer: Self-pay | Admitting: Hematology and Oncology

## 2019-10-14 ENCOUNTER — Inpatient Hospital Stay (HOSPITAL_COMMUNITY): Payer: BC Managed Care – PPO

## 2019-10-14 DIAGNOSIS — Z79899 Other long term (current) drug therapy: Secondary | ICD-10-CM

## 2019-10-14 LAB — CBC WITH DIFFERENTIAL/PLATELET
Abs Immature Granulocytes: 0.1 10*3/uL — ABNORMAL HIGH (ref 0.00–0.07)
Basophils Absolute: 0.1 10*3/uL (ref 0.0–0.1)
Basophils Relative: 2 %
Eosinophils Absolute: 0.1 10*3/uL (ref 0.0–0.5)
Eosinophils Relative: 3 %
HCT: 37.2 % (ref 36.0–46.0)
Hemoglobin: 11.9 g/dL — ABNORMAL LOW (ref 12.0–15.0)
Immature Granulocytes: 3 %
Lymphocytes Relative: 35 %
Lymphs Abs: 1.1 10*3/uL (ref 0.7–4.0)
MCH: 29.2 pg (ref 26.0–34.0)
MCHC: 32 g/dL (ref 30.0–36.0)
MCV: 91.2 fL (ref 80.0–100.0)
Monocytes Absolute: 0.7 10*3/uL (ref 0.1–1.0)
Monocytes Relative: 22 %
Neutro Abs: 1.1 10*3/uL — ABNORMAL LOW (ref 1.7–7.7)
Neutrophils Relative %: 35 %
Platelets: 258 10*3/uL (ref 150–400)
RBC: 4.08 MIL/uL (ref 3.87–5.11)
RDW: 12.9 % (ref 11.5–15.5)
WBC: 3.1 10*3/uL — ABNORMAL LOW (ref 4.0–10.5)
nRBC: 0 % (ref 0.0–0.2)

## 2019-10-14 LAB — BASIC METABOLIC PANEL
Anion gap: 14 (ref 5–15)
BUN: 11 mg/dL (ref 6–20)
CO2: 20 mmol/L — ABNORMAL LOW (ref 22–32)
Calcium: 8.8 mg/dL — ABNORMAL LOW (ref 8.9–10.3)
Chloride: 111 mmol/L (ref 98–111)
Creatinine, Ser: 0.64 mg/dL (ref 0.44–1.00)
GFR calc Af Amer: >60 mL/min (ref >60)
GFR calc non Af Amer: >60 mL/min (ref >60)
Glucose, Bld: 86 mg/dL (ref 70–99)
Potassium: 2.9 mmol/L — ABNORMAL LOW (ref 3.5–5.1)
Sodium: 145 mmol/L (ref 135–145)

## 2019-10-14 LAB — MAGNESIUM: Magnesium: 2 mg/dL (ref 1.7–2.4)

## 2019-10-14 LAB — HIV ANTIBODY (ROUTINE TESTING W REFLEX): HIV Screen 4th Generation wRfx: NONREACTIVE

## 2019-10-14 MED ORDER — CHLORHEXIDINE GLUCONATE CLOTH 2 % EX PADS
6.0000 | MEDICATED_PAD | Freq: Every day | CUTANEOUS | Status: DC
  Administered 2019-10-14 – 2019-10-19 (×6): 6 via TOPICAL

## 2019-10-14 MED ORDER — POLYETHYLENE GLYCOL 3350 17 G PO PACK
17.0000 g | PACK | Freq: Every day | ORAL | Status: DC
  Administered 2019-10-14 – 2019-10-16 (×2): 17 g via ORAL
  Filled 2019-10-14 (×3): qty 1

## 2019-10-14 MED ORDER — ORAL CARE MOUTH RINSE
15.0000 mL | Freq: Two times a day (BID) | OROMUCOSAL | Status: DC
  Administered 2019-10-15 – 2019-10-19 (×5): 15 mL via OROMUCOSAL

## 2019-10-14 MED ORDER — HYDRALAZINE HCL 20 MG/ML IJ SOLN
10.0000 mg | Freq: Four times a day (QID) | INTRAMUSCULAR | Status: DC
  Administered 2019-10-14 – 2019-10-19 (×20): 10 mg via INTRAVENOUS
  Filled 2019-10-14 (×20): qty 1

## 2019-10-14 MED ORDER — POTASSIUM CHLORIDE 10 MEQ/100ML IV SOLN
10.0000 meq | INTRAVENOUS | Status: AC
  Administered 2019-10-14 (×4): 10 meq via INTRAVENOUS
  Filled 2019-10-14 (×4): qty 100

## 2019-10-14 MED ORDER — PANTOPRAZOLE SODIUM 40 MG IV SOLR
40.0000 mg | INTRAVENOUS | Status: DC
  Administered 2019-10-14 – 2019-10-16 (×3): 40 mg via INTRAVENOUS
  Filled 2019-10-14 (×3): qty 40

## 2019-10-14 MED ORDER — PRO-STAT SUGAR FREE PO LIQD
30.0000 mL | Freq: Two times a day (BID) | ORAL | Status: DC
  Administered 2019-10-14 – 2019-10-19 (×7): 30 mL via ORAL
  Filled 2019-10-14 (×6): qty 30

## 2019-10-14 MED ORDER — BOOST / RESOURCE BREEZE PO LIQD CUSTOM
1.0000 | Freq: Three times a day (TID) | ORAL | Status: DC
  Administered 2019-10-14 – 2019-10-18 (×4): 1 via ORAL

## 2019-10-14 MED ORDER — ADULT MULTIVITAMIN W/MINERALS CH
1.0000 | ORAL_TABLET | Freq: Every day | ORAL | Status: DC
  Administered 2019-10-14 – 2019-10-19 (×5): 1 via ORAL
  Filled 2019-10-14 (×5): qty 1

## 2019-10-14 MED ORDER — CHLORHEXIDINE GLUCONATE 0.12 % MT SOLN
15.0000 mL | Freq: Two times a day (BID) | OROMUCOSAL | Status: DC
  Administered 2019-10-14 – 2019-10-19 (×10): 15 mL via OROMUCOSAL
  Filled 2019-10-14 (×10): qty 15

## 2019-10-14 NOTE — Progress Notes (Signed)
She is noted to have uncontrolled hypertension. She stopped all her home meds because of inability to swallow.  I will start her on hydralazine intravenous.

## 2019-10-14 NOTE — Progress Notes (Addendum)
[facility] PROGRESS NOTE  Patient Care Team: Janith Lima, MD as PCP - General (Internal Medicine) Constance Haw, MD as PCP - Electrophysiology (Cardiology) Constance Haw, MD as Consulting Physician (Cardiology)  I have seen her, examined and agreed with documentation as follows ASSESSMENT & PLAN:  Chronic lymphocytic leukemia Overall, she tolerated chemotherapy well She had positive response to treatment The small lymphadenopathy seen in her CT scan of the chest is not significant enough to have caused her symptoms She has not been able to take chemotherapy due to inability to swallow Continue supportive care for now  Severe dysphagia She had near 0 oral intake of food and water over the past week She could not swallow any of her pills for over a week The patient is grossly dehydrated.  Despite 2 L of IV fluid in the outpatient clinic, she was oliguric GI has been consulted and recommends trial of Magic mouthwash with lidocaine and IV fluconazole Considering upper endoscopy if no improvement by early next week Speech therapy has been consulted and planning modified barium swallow later today I will continue aggressive IV fluid resuscitation in the meantime I recommend calori count to document oral intake  Severe hypokalemia This is due to poor oral intake Magnesium has now corrected Potassium remains low Potassium runs ordered for today and will continue IV fluids with potassium  Mild pancytopenia This is due to her recent treatment She is not symptomatic Observe for now  Severe constipation Will try Miralax if she can swallow  Covid testing She is not symptomatic Her recent Covid testing on October 07, 2019 is negative  repeat Covid testing from 10/13/2019 is negative  DVT prophylaxis She was taking Eliquis in the past due to chronic atrial fibrillation She has not swallow Eliquis over the past week She is on DVT prophylaxis with Lovenox  while she is hospitalized  CODE STATUS Full code  Goals of care discussion Resolution of dysphagia.  She needs further work-up.  She also grossly dehydrated requiring aggressive IV fluid resuscitation.  She needs inpatient hospitalization because she has failed outpatient therapy.  The risk of not admitting her with the risk of death  Discharge planning I am hopeful we can discharge her over the next 2 to 3 days once her dysphagia resolved and the cost can be found    Mikey Bussing, NP 10/14/2019 10:43 AM Heath Lark, MD  INTERVAL HISTORY:  Continues to have dysphagia States she was able to drink a sip of water but had significant pain in her neck, throat, and upper chest Unable to swallow pills Still has some ongoing generalized weakness and intermittent dizziness She has no pain other than the pain in her neck and throat when she attempts to swallow She vomited some water today. Able to swallow some jello Due to poor oral intake, she is not able to have bowel movement either for 1 week She denies recent infection, fever or chills. Starting to have more urine output  Summary of oncologic history as follows: Oncology History Overview Note  17p positive   CLL (chronic lymphocytic leukemia) ([place])  04/05/2015 Pathology Results   Accession: [id number] flow cytometry confirmed CLL. FISH was positive for p53 mutation   04/24/2015 Imaging   Extensive lymphadenopathy throughout the neck, chest (axilla), abdomen and pelvis, as detailed above, compatible with the reported clinical history of lymphoma. 2. Mild splenomegaly.    05/03/2015 - 08/27/2015 Chemotherapy   She started on Ibrutinib, discontinued prematurely when  her prescription ran out   10/10/2015 - 05/29/2017 Chemotherapy   She was restarted back on Ibrutinib   11/13/2016 PET scan   Significant generalized reduction in size of numerous lymph nodes in the neck, chest, abdomen, and pelvis. Previously the activity of these nodes was  low-level and in general a similar low-level activity is present today, significantly less than mediastinal blood pool activity, compatible with Deauville 2. 2. Coronary atherosclerosis.  Mild cardiomegaly. 3. Mildly prominent endometrium for age without accentuated metabolic activity in the endometrium. Consider pelvic sonography for further characterization.   05/27/2017 PET scan   1. Progressive hypermetabolic adenopathy, primarily involving cervical, axillary, pelvic and inguinal lymph nodes bilaterally, consistent with progressive lymphoma. 2. No solid visceral organ or osseous involvement.   06/16/2017 - 08/25/2017 Chemotherapy   The patient had 3 cycles of Rituximab and Bendamustine   07/15/2017 - 07/19/2017 Hospital Admission   The patient was briefly admitted to the hospital due to infusion reaction to rituximab   09/18/2017 PET scan   1. Continued considerable adenopathy in the neck, chest, abdomen, and pelvis. This is generally stable in size but mildly reduced in activity compared to the prior exam. Current levels of activity primarily Deauville 3 and Deauville 4. No splenomegaly. 2. Diffuse new ground-glass opacities in the lungs with associated hypermetabolic activity. Some forms of lymphoma infiltration can rarely cause this pattern of diffuse ground-glass opacity and hypermetabolic activity. Differential diagnostic considerations might include atypical pneumonia such as mycoplasma, acute hypersensitivity pneumonitis, or acute eosinophilic pneumonia. Pulmonary hemorrhage seems less likely to cause this degree of accentuated metabolic activity.  3.  Aortic Atherosclerosis (ICD10-I70.0).  Coronary atherosclerosis.   09/24/2017 -  Chemotherapy   The patient had ventoclax for chemotherapy treatment.  Rituximab is added on 11/18/17 to 04/09/18, x 6 cycles   11/19/2017 PET scan   Overall mild interval decrease in hypermetabolic lymphadenopathy throughout the neck, chest, abdomen, and pelvis.  No new or increased lymphadenopathy identified.  While there has been resolution of diffuse hypermetabolic bilateral ground-glass pulmonary opacity since prior study, there is a new 14 mm hypermetabolic pulmonary nodule in the posterior right lower lobe. Time course favors inflammatory or infectious etiology over neoplasm. Recommend continued follow-up by chest CT in 3 months.   02/16/2018 PET scan   1. Adenopathy in the neck, chest, and pelvis is stable to minimally reduced in size, and is moderately reduced in activity, primarily Deauville 2 disease today. There is a right common iliac lymph node qualifying as Deauville 3 disease which is stable in size but reduced in activity. 2. Stable size but reduced activity in a pulmonary nodule in the right lower lobe. If this represents a leukemic lesion then a corresponds to Deauville 4 disease. This lesion was not readily apparent on 09/22/2017 but was reported on the prior chest CT of 11/19/2017. 3. Other imaging findings of potential clinical significance: Mild thyroid goiter. Aortic Atherosclerosis (ICD10-I70.0). Coronary atherosclerosis. Mild cardiomegaly.   04/19/2018 PET scan   1. Interval mild mixed metabolic changes, as detailed. Persistent mildly hypermetabolic bilateral axillary, mediastinal and bilateral pelvic adenopathy and mildly hypermetabolic right lower lobe pulmonary nodule compatible with lymphoproliferative disorder. Deauville 4 based on the subcarinal node. 2.  Aortic Atherosclerosis (ICD10-I70.0).   06/29/2018 Procedure   Successful placement of a right internal jugular approach power injectable Port-A-Cath. The catheter is ready for immediate use.   07/26/2018 PET scan   1. Adenopathy primarily in the chest and pelvis as noted above, with size of the  mildly enlarged lymph nodes stable to minimally increased, but with nodal activity generally decreased compared to the prior exam. Uptake in these nodes is primarily Deauville 2. 2.  Aortic Atherosclerosis (ICD10-I70.0). Coronary atherosclerosis with mild cardiomegaly.   10/29/2018 PET scan   1. Relatively stable sized lymphadenopathy but interval increase in hypermetabolism when compared to the most recent prior PET-CT, as detailed above. 2. No new disease is identified. 3. Stable bilateral pulmonary nodules.   11/12/2018 - 02/02/2019 Chemotherapy   The patient had riTUXimab (RITUXAN) 800 mg in sodium chloride 0.9 % 170 mL infusion, 375 mg/m2 = 800 mg, Intravenous,  Once, 3 of 4 cycles Administration: 800 mg (11/12/2018), 800 mg (12/10/2018), 800 mg (01/06/2019)  for chemotherapy treatment.    01/11/2019 Imaging   1. Significant increased adenopathy in the chest, abdomen, and pelvis compared to the 10/28/2018 PET-CT. 2. The scattered tiny pulmonary nodules and larger single right lower lobe pulmonary nodule are all stable. 3. Other imaging findings of potential clinical significance: Aortic Atherosclerosis (ICD10-I70.0). Coronary atherosclerosis. Mild cardiomegaly. Mild diffuse thyroid prominence, stable. Mild left foraminal impingement at L4-5.   01/21/2019 Procedure   Successful ultrasound-guided core biopsies of an enlarged right axillary lymph node.     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 The latter is important because it essentially rules out the possibility of mantle cell lymphoma. CD3 highlights a small population of T-cells. By flow cytometry, a kappa-restricted monoclonal B-cell population that expresses CD19, CD20, CD5, and CD23 comprises 89% of all lymphocytes. Overall, the features are consistent with chronic lymphocytic leukemia/small lymphocytic  lymphoma.   01/31/2019 Echocardiogram    1. The left ventricle has normal systolic function, with an ejection fraction of 55-60%. The cavity size was normal. Left ventricular diastolic Doppler parameters are consistent with impaired relaxation. No evidence of left ventricular regional wall motion abnormalities. GLS -20%.  2. The right ventricle has normal systolic function. The cavity was normal. There is no increase in right ventricular wall thickness.  3. The aortic valve is tricuspid. Mild calcification of the aortic valve. No stenosis of the aortic valve.  4. The aortic root is normal in size and structure.  5. 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  6. No evidence of mitral valve stenosis. No significant mitral regurgitation.  7. Normal IVC size. No complete TR doppler jet so unable to estimate PA systolic pressure.   02/03/2019 - 07/24/2019 Chemotherapy   The patient had obinutuzumab and Acalabrutinib for chemotherapy treatment.    05/02/2019 Imaging   1. Interval decrease in size axillary and mediastinal adenopathy. 2. There are a few retroperitoneal lymph nodes which are mildly increased in size. The majority of the retroperitoneal and pelvic lymph nodes are grossly similar when compared to prior exam. 3. Stable scattered pulmonary nodules.   07/22/2019 Imaging   1. Progressive disease within the neck, chest, abdomen, and pelvis, as evidenced by increase in adenopathy. 2. Similar bilateral pulmonary nodules. 3. Extensive right-sided colonic pneumatosis and small volume extracolonic intraperitoneal gas. Correlate with abdominal symptoms. Of note, pneumatosis has been described in patients on immunotherapy. 4. Age advanced coronary artery atherosclerosis. Recommend assessment of coronary risk factors and consideration of medical therapy. 5.  Aortic Atherosclerosis (ICD10-I70.0). 6. Similar mild  L1 compression deformity. 7. Trace free pelvic fluid, similar. 8.  Pulmonary artery enlargement suggests pulmonary arterial hypertension.   08/11/2019 -  Chemotherapy   The patient had duvelisib for chemotherapy treatment.     10/12/2019 Imaging   1. Slight enlargement of a high right paratracheal lymph node. Otherwise, lymph nodes in the axillary regions, abdomen and pelvis have decreased in size in the interval, consistent with treatment response. 2. Interval decrease in size and number of pulmonary nodules, indicative of metastatic disease. 3. There may be mild nonspecific diffuse esophageal wall thickening. 4. Aortic atherosclerosis (ICD10-I70.0). Coronary artery calcification. 5. Enlarged pulmonic trunk, indicative of arterial hypertension.   10/13/2019 -  Hospital Admission   She was admitted for severe dysphagia, dehydration and electrolytes abnormalities     MEDICAL HISTORY:  Past Medical History:  Diagnosis Date   Atrial fibrillation ([place])    Cystic breast    Depression    Diffuse cystic mastopathy    Dyslipidemia (high LDL; low HDL)    Hypertension    Iron deficiency anemia, unspecified    Lymphadenopathy of head and neck 04/05/2015   Medical non-compliance    Mitral regurgitation 02/2017   moderate to severe   Persistent atrial fibrillation with rapid ventricular response ([place]) 07/15/2017    SURGICAL HISTORY: Past Surgical History:  Procedure Laterality Date   CARDIOVERSION N/A 04/02/2017   Procedure: CARDIOVERSION;  Surgeon: Dorothy Spark, MD;  Location: [facility];  Service: Cardiovascular;  Laterality: N/A;   CARDIOVERSION N/A 08/19/2017   Procedure: CARDIOVERSION;  Surgeon: Larey Dresser, MD;  Location[facility] ENDOSCOPY;  Service: Cardiovascular;  Laterality: N/A;   IR IMAGING GUIDED PORT INSERTION  06/29/2018   TUBAL LIGATION      SOCIAL HISTORY: Social History   Socioeconomic History   Marital status: Married    Spouse name: Not on file   Number of children: 6   Years of education: Not on file   Highest education  level: Not on file  Occupational History   Occupation: Heritage manager: [employer]  Tobacco Use   Smoking status: Never Smoker   Smokeless tobacco: Never Used  Substance and Sexual Activity   Alcohol use: No   Drug use: No   Sexual activity: Not Currently    Partners: Male    Birth control/protection: Surgical    Comment: tubal ligation  Other Topics Concern   Not on file  Social History Narrative   Not on file   Social Determinants of Health   Financial Resource Strain:    Difficulty of Paying Living Expenses: Not on file  Food Insecurity:    Worried About [place] in the Last Year: Not on file   [employer] of Food in the Last Year: Not on file  Transportation Needs:    Lack of Transportation (Medical): Not on file   Lack of Transportation (Non-Medical): Not on file  Physical Activity:    Days of Exercise per Week: Not on file   Minutes of Exercise per Session: Not on file  Stress:    Feeling of Stress : Not on file  Social Connections:    Frequency of Communication with Friends and Family: Not on file   Frequency of Social Gatherings with Friends and Family: Not on file   Attends Religious Services: Not on file   Active Member of Clubs or Organizations: Not on file   Attends Archivist Meetings: Not on file   Marital Status: Not on  file  Intimate Partner Violence:    Fear of Current or Ex-Partner: Not on file   Emotionally Abused: Not on file   Physically Abused: Not on file   Sexually Abused: Not on file    FAMILY HISTORY: Family History  Problem Relation Age of Onset   Sudden death Mother    Kidney disease Father        H/O HD and kidney transplant   Stomach cancer Maternal Aunt    Stomach cancer Paternal Grandmother    Heart disease Brother    Heart disease Maternal Grandmother     ALLERGIES:  has No Known Allergies.  MEDICATIONS:  Current Facility-Administered Medications  Medication Dose Route Frequency Provider Last Rate Last  Admin   acetaminophen (TYLENOL) tablet 650 mg  650 mg Oral Q4H PRN Alvy Bimler, Temprence Rhines, MD       Chlorhexidine Gluconate Cloth 2 % PADS 6 each  6 each Topical Daily Alvy Bimler, Doniel Maiello, MD   6 each at 10/14/19 1017   enoxaparin (LOVENOX) injection 40 mg  40 mg Subcutaneous Q24H Alvy Bimler, Adreonna Yontz, MD   40 mg at 10/13/19 1825   fluconazole (DIFLUCAN) IVPB 100 mg  100 mg Intravenous Q24H Willia Craze, NP 50 mL/hr at 10/14/19 0750 100 mg at 10/14/19 0750   fluconazole (DIFLUCAN) IVPB 200 mg  200 mg Intravenous Q24H Willia Craze, NP 100 mL/hr at 10/13/19 1830 200 mg at 10/13/19 1830   lidocaine-prilocaine (EMLA) cream 1 application  1 application Topical PRN Heath Lark, MD       magic mouthwash w/lidocaine  10 mL Oral TID Willia Craze, NP   10 mL at 10/14/19 1016   ondansetron (ZOFRAN) tablet 4-8 mg  4-8 mg Oral Q8H PRN Heath Lark, MD       Or   ondansetron (ZOFRAN-ODT) disintegrating tablet 4-8 mg  4-8 mg Oral Q8H PRN Alvy Bimler, Iszabella Hebenstreit, MD       Or   ondansetron (ZOFRAN) injection 4 mg  4 mg Intravenous Q8H PRN Alvy Bimler, Nimisha Rathel, MD       Or   ondansetron (ZOFRAN) 8 mg in sodium chloride 0.9 % 50 mL IVPB  8 mg Intravenous Q8H PRN Areanna Gengler, MD       potassium chloride 10 mEq in 100 mL IVPB  10 mEq Intravenous Q1 Hr x 4 Demarrius Guerrero, MD 100 mL/hr at 10/14/19 1020 10 mEq at 10/14/19 1020   senna-docusate (Senokot-S) tablet 1 tablet  1 tablet Oral QHS PRN Alvy Bimler, Draydon Clairmont, MD       sodium chloride 0.9 % 1,000 mL with potassium chloride 40 mEq infusion   Intravenous Continuous Heath Lark, MD 50 mL/hr at 10/13/19 2035 New Bag at 10/13/19 2035    REVIEW OF SYSTEMS:   Constitutional: Denies fevers, chills or abnormal night sweats Eyes: Denies blurriness of vision, double vision or watery eyes Ears, nose, mouth, throat, and face: Denies mucositis or sore throat Respiratory: Denies cough, dyspnea or wheezes Cardiovascular: Denies palpitation, chest discomfort or lower extremity swelling Skin: Denies abnormal skin  rashes Lymphatics: Denies new lymphadenopathy or easy bruising Behavioral/Psych: Mood is stable, no new changes  All other systems were reviewed with the patient and are negative.  PHYSICAL EXAMINATION: ECOG PERFORMANCE STATUS: 1 - Symptomatic but completely ambulatory BP (!) 157/94 (BP Location: Left Arm)   Pulse 68   Temp 98.8 F (37.1 C) (Oral)   Resp 18   Wt 187 lb 9.8 oz (85.1 kg)   LMP 02/03/2017 (Approximate)   SpO2  99%   BMI 29.38 kg/m    Intake/Output Summary (Last 24 hours) at 10/14/2019 1047 Last data filed at 10/14/2019 0545 Gross per 24 hour  Intake 982.75 ml  Output 500 ml  Net 482.75 ml   GENERAL:alert, no distress and comfortable, she looks dehydrated with dry mucous membrane SKIN: skin color, texture, turgor are normal, no rashes or significant lesions EYES: normal, conjunctiva are pink and non-injected, sclera clear OROPHARYNX:no exudate, no erythema and lips, buccal mucosa, and tongue normal.  Dry mucous membrane is noted NECK: supple, thyroid normal size, non-tender, without nodularity LYMPH:  no palpable lymphadenopathy in the cervical, axillary or inguinal LUNGS: clear to auscultation and percussion with normal breathing effort HEART: regular rate & rhythm and no murmurs and no lower extremity edema ABDOMEN:abdomen soft, non-tender and normal bowel sounds Musculoskeletal:no cyanosis of digits and no clubbing  PSYCH: alert & oriented x 3 with fluent speech NEURO: no focal motor/sensory deficits  LABORATORY DATA:  I have reviewed the data as listed Lab Results  Component Value Date   WBC 3.1 (L) 10/14/2019   HGB 11.9 (L) 10/14/2019   HCT 37.2 10/14/2019   MCV 91.2 10/14/2019   PLT 258 10/14/2019   Recent Labs    09/06/19 1147 10/03/19 1238 10/13/19 0843 10/14/19 0844  NA 141 144 145 145  K 4.2 4.1 2.9* 2.9*  CL 104 108 107 111  CO2 27 25 18* 20*  GLUCOSE 107* 117* 97 86  BUN '11 9 12 11  ' CREATININE 0.82 0.81 0.86 0.64  CALCIUM 9.3 9.3 9.0  8.8*  GFRNONAA >60 >60 >60 >60  GFRAA >60 >60 >60 >60  PROT 6.4* 6.8 6.9  --   ALBUMIN 3.9 3.8 3.9  --   AST 14* 13* 20  --   ALT '15 9 9  ' --   ALKPHOS 61 59 58  --   BILITOT 0.8 0.8 0.9  --     RADIOGRAPHIC STUDIES: I have personally reviewed the radiological images as listed and agreed with the findings in the report. CT Chest W Contrast  Result Date: 10/12/2019 CLINICAL DATA:  CLL. New dysphagia. Ongoing chemotherapy pill. Cough with vomiting. EXAM: CT CHEST, ABDOMEN, AND PELVIS WITH CONTRAST TECHNIQUE: Multidetector CT imaging of the chest, abdomen and pelvis was performed following the standard protocol during bolus administration of intravenous contrast. CONTRAST:  124m OMNIPAQUE IOHEXOL 300 MG/ML  SOLN COMPARISON:  07/22/2019. FINDINGS: CT CHEST FINDINGS Cardiovascular: Right IJ Port-A-Cath terminates in the high right atrium. Atherosclerotic calcification of the aorta and coronary arteries. Pulmonic trunk is enlarged. Heart is at the upper limits of normal in size to mildly enlarged. No pericardial effusion. Mediastinum/Nodes: High right paratracheal lymph node is enlarged slightly now measuring 7 mm (2/12) previously 4 mm. No additional pathologically enlarged mediastinal or hilar lymph nodes. Subpectoral and axillary lymph nodes have decreased in size in the interval. Index left axillary lymph node measures 11 mm (2/10), compared to 1.9 cm on 07/22/2019. There may be mild diffuse esophageal wall thickening. Lungs/Pleura: Minimal biapical pleuroparenchymal scarring. Decrease in size and number of bilateral pulmonary nodules. For example, a 3 mm nodule seen in the anterior segment right upper lobe on 07/22/2019 is no longer visualized. 12 mm medial right lower lobe nodule (4/79), decreased from 15 mm. No pleural fluid. Airway is unremarkable. Musculoskeletal: No worrisome lytic or sclerotic lesions. CT ABDOMEN PELVIS FINDINGS Hepatobiliary: Liver and gallbladder are unremarkable. No biliary  ductal dilatation. Pancreas: Negative. Spleen: Negative. Adrenals/Urinary Tract: Adrenal glands are  unremarkable. Low-attenuation lesions in the right kidney measure up to 1.5 cm and are likely cysts. Kidneys are otherwise unremarkable. Ureters are decompressed. Bladder is low in volume. Stomach/Bowel: Stomach, small bowel, appendix and colon are unremarkable. Vascular/Lymphatic: Atherosclerotic calcification of the aorta without aneurysm. Retroperitoneal lymph nodes have decreased in size in the interval. Index left periaortic lymph node measures 6 mm (2/76), previously 10 mm. Abdominal peritoneal ligament lymph nodes are not enlarged by CT size criteria, similar to the prior exam. Mesenteric lymph nodes have decreased in size. Index lymph node near the root of the small bowel mesentery measures 4 mm (2/66), previously 7 mm. Pelvic retroperitoneal lymph nodes have decreased in size in the interval as well. Index left external iliac lymph node measures 10 mm (2/104), previously 1.6 cm. Inguinal lymph nodes are not enlarged by CT size criteria. Reproductive: Uterus is visualized.  No adnexal mass. Other: Mild laxity of the ventral abdominal wall at the umbilicus. No free fluid. Mesenteries and peritoneum are otherwise unremarkable. Musculoskeletal: No worrisome lytic or sclerotic lesions. Mild L1 compression deformity, unchanged. IMPRESSION: 1. Slight enlargement of a high right paratracheal lymph node. Otherwise, lymph nodes in the axillary regions, abdomen and pelvis have decreased in size in the interval, consistent with treatment response. 2. Interval decrease in size and number of pulmonary nodules, indicative of metastatic disease. 3. There may be mild nonspecific diffuse esophageal wall thickening. 4. Aortic atherosclerosis (ICD10-I70.0). Coronary artery calcification. 5. Enlarged pulmonic trunk, indicative of arterial hypertension. Electronically Signed   By: Lorin Picket M.D.   On: 10/12/2019 13:05   CT  Abdomen Pelvis W Contrast  Result Date: 10/12/2019 CLINICAL DATA:  CLL. New dysphagia. Ongoing chemotherapy pill. Cough with vomiting. EXAM: CT CHEST, ABDOMEN, AND PELVIS WITH CONTRAST TECHNIQUE: Multidetector CT imaging of the chest, abdomen and pelvis was performed following the standard protocol during bolus administration of intravenous contrast. CONTRAST:  166m OMNIPAQUE IOHEXOL 300 MG/ML  SOLN COMPARISON:  07/22/2019. FINDINGS: CT CHEST FINDINGS Cardiovascular: Right IJ Port-A-Cath terminates in the high right atrium. Atherosclerotic calcification of the aorta and coronary arteries. Pulmonic trunk is enlarged. Heart is at the upper limits of normal in size to mildly enlarged. No pericardial effusion. Mediastinum/Nodes: High right paratracheal lymph node is enlarged slightly now measuring 7 mm (2/12) previously 4 mm. No additional pathologically enlarged mediastinal or hilar lymph nodes. Subpectoral and axillary lymph nodes have decreased in size in the interval. Index left axillary lymph node measures 11 mm (2/10), compared to 1.9 cm on 07/22/2019. There may be mild diffuse esophageal wall thickening. Lungs/Pleura: Minimal biapical pleuroparenchymal scarring. Decrease in size and number of bilateral pulmonary nodules. For example, a 3 mm nodule seen in the anterior segment right upper lobe on 07/22/2019 is no longer visualized. 12 mm medial right lower lobe nodule (4/79), decreased from 15 mm. No pleural fluid. Airway is unremarkable. Musculoskeletal: No worrisome lytic or sclerotic lesions. CT ABDOMEN PELVIS FINDINGS Hepatobiliary: Liver and gallbladder are unremarkable. No biliary ductal dilatation. Pancreas: Negative. Spleen: Negative. Adrenals/Urinary Tract: Adrenal glands are unremarkable. Low-attenuation lesions in the right kidney measure up to 1.5 cm and are likely cysts. Kidneys are otherwise unremarkable. Ureters are decompressed. Bladder is low in volume. Stomach/Bowel: Stomach, small bowel,  appendix and colon are unremarkable. Vascular/Lymphatic: Atherosclerotic calcification of the aorta without aneurysm. Retroperitoneal lymph nodes have decreased in size in the interval. Index left periaortic lymph node measures 6 mm (2/76), previously 10 mm. Abdominal peritoneal ligament lymph nodes are not enlarged by CT size  criteria, similar to the prior exam. Mesenteric lymph nodes have decreased in size. Index lymph node near the root of the small bowel mesentery measures 4 mm (2/66), previously 7 mm. Pelvic retroperitoneal lymph nodes have decreased in size in the interval as well. Index left external iliac lymph node measures 10 mm (2/104), previously 1.6 cm. Inguinal lymph nodes are not enlarged by CT size criteria. Reproductive: Uterus is visualized.  No adnexal mass. Other: Mild laxity of the ventral abdominal wall at the umbilicus. No free fluid. Mesenteries and peritoneum are otherwise unremarkable. Musculoskeletal: No worrisome lytic or sclerotic lesions. Mild L1 compression deformity, unchanged. IMPRESSION: 1. Slight enlargement of a high right paratracheal lymph node. Otherwise, lymph nodes in the axillary regions, abdomen and pelvis have decreased in size in the interval, consistent with treatment response. 2. Interval decrease in size and number of pulmonary nodules, indicative of metastatic disease. 3. There may be mild nonspecific diffuse esophageal wall thickening. 4. Aortic atherosclerosis (ICD10-I70.0). Coronary artery calcification. 5. Enlarged pulmonic trunk, indicative of arterial hypertension. Electronically Signed   By: Lorin Picket M.D.   On: 10/12/2019 13:05

## 2019-10-14 NOTE — Progress Notes (Signed)
Modified Barium Swallow Progress Note  Patient Details  Name: Audrey Gross MRN: [id number] Date of Birth: 12-Jun-1961  Today's Date: 10/14/2019  Modified Barium Swallow completed.  Full report located under Chart Review in the Imaging Section.  Brief recommendations include the following:  Clinical Impression  Pt exhibits significant labial and facial weakness and reduced ROM. She affirmed she was told she had Bell's palsy however this therapist unable to find confirmation through documentation. Despite impairments and oral candidias she was able to functionally masticate, manipulate and transit puree, thin as well as regular texture boluses and denied odonophagia during study. Thin barium was penetrated on two trials however completely exited laryngeal vestibule during the swallow (one was close to cords). Chin tuck prevented but pt stated it hurt her neck and did not wish to perform. Mild residue mid esophagus(MBS does not diagnose below level of UES). Discussed options for diet textures and she stated she would not eat puree and "could order soft food." Recommend Dys 3, thin, crushed pills and allow pt to choose foods. ST will continue to see for tolerance of recommendations.     Swallow Evaluation Recommendations       SLP Diet Recommendations: Dysphagia 3 (Mech soft) solids;Thin liquid   Liquid Administration via: Cup;Straw   Medication Administration: Crushed with puree   Supervision: Patient able to self feed   Compensations: Slow rate;Small sips/bites   Postural Changes: Seated upright at 90 degrees   Oral Care Recommendations: Oral care BID        [place] Siren 10/14/2019,5:04 PM  Orbie Pyo Colvin Caroli.Ed Risk analyst [phone] Office [phone]

## 2019-10-14 NOTE — Progress Notes (Signed)
  Speech Language Pathology  Patient Details Name: Audrey Gross MRN: [id number] DOB: [birth year] Today's Date: 10/14/2019 Time:  -      MBS is scheduled for today around 1330.                 Houston Siren 10/14/2019, 8:16 AM   Orbie Pyo Colvin Caroli.Ed Risk analyst [phone] Office [phone]

## 2019-10-14 NOTE — Progress Notes (Signed)
Progress Note    ASSESSMENT AND PLAN:   60 yo female with PMH significant for chronic lymphoblastic leukemia, hyperlipidemia, hypertension and atrial fibrillation on Eliquis.   1. Odynophagia / dysphagia in setting of recent antibiotics, steroids / chemotherapy. Chest CT scan suggests mild non-specific esophageal wall thickening. Suspect oral / esophageal candida but viral etiologies such as HSV, CMV cannot be excluded. Some mild improvement with Diflucan + Magic mouthwash.   --Continue clear liquids.  --Continue mouthwash with lidocaine --Continue IV Fluconazole. If no improvement over the weekend then will proceed with EGD. Her Eliquis ( Afib) is currently on hold.   2. CLL, on chemotherapy. Chest /abd / pelvic CT scan >> decrase in size and number of pulmonary nodules, slight enlargement of a high right paratracheal lymph node. Doubt node large enough to be contributing to dysphagia.     3. Persistent hypokalemia, repletion in progress. Mg+ improved after repletion.     SUBJECTIVE   Clear liquids going down the "rough spot" in throat a little better today but about the same other than that.    OBJECTIVE:     Vital signs in last 24 hours: Temp:  [98.8 F (37.1 C)-99.7 F (37.6 C)] 98.8 F (37.1 C) (01/08 0544) Pulse Rate:  [68-96] 68 (01/08 0544) Resp:  [16-18] 18 (01/08 0544) BP: (147-158)/(76-97) 157/94 (01/08 0544) SpO2:  [97 %-100 %] 99 % (01/08 0544) Weight:  [85.1 kg] 85.1 kg (01/07 1448)   General:   Alert, in NAD EENT:  Normal hearing, non icteric sclera   Pulm: Normal respiratory effort   Abdomen:  Soft, nondistended, nontender.  Normal bowel sounds.          Neurologic:  Alert and  oriented x4;  grossly normal neurologically. Psych:  Pleasant, cooperative.  Normal mood and affect.   Intake/Output from previous day: 01/07 0701 - 01/08 0700 In: 982.8 [P.O.:240; I.V.:642.8; IV Piggyback:100] Out: 500 [Urine:500] Intake/Output this shift: No  intake/output data recorded.  Lab Results: Recent Labs    10/13/19 0843 10/14/19 0844  WBC 2.4* 3.1*  HGB 12.8 11.9*  HCT 38.8 37.2  PLT 309 258   BMET Recent Labs    10/13/19 0843 10/14/19 0844  NA 145 145  K 2.9* 2.9*  CL 107 111  CO2 18* 20*  GLUCOSE 97 86  BUN 12 11  CREATININE 0.86 0.64  CALCIUM 9.0 8.8*   LFT Recent Labs    10/13/19 0843  PROT 6.9  ALBUMIN 3.9  AST 20  ALT 9  ALKPHOS 58  BILITOT 0.9   PT/INR No results for input(s): LABPROT, INR in the last 72 hours. Hepatitis Panel No results for input(s): HEPBSAG, HCVAB, HEPAIGM, HEPBIGM in the last 72 hours.  CT Chest W Contrast  Result Date: 10/12/2019 CLINICAL DATA:  CLL. New dysphagia. Ongoing chemotherapy pill. Cough with vomiting. EXAM: CT CHEST, ABDOMEN, AND PELVIS WITH CONTRAST TECHNIQUE: Multidetector CT imaging of the chest, abdomen and pelvis was performed following the standard protocol during bolus administration of intravenous contrast. CONTRAST:  16mL OMNIPAQUE IOHEXOL 300 MG/ML  SOLN COMPARISON:  07/22/2019. FINDINGS: CT CHEST FINDINGS Cardiovascular: Right IJ Port-A-Cath terminates in the high right atrium. Atherosclerotic calcification of the aorta and coronary arteries. Pulmonic trunk is enlarged. Heart is at the upper limits of normal in size to mildly enlarged. No pericardial effusion. Mediastinum/Nodes: High right paratracheal lymph node is enlarged slightly now measuring 7 mm (2/12) previously 4 mm. No additional pathologically enlarged mediastinal or hilar lymph  nodes. Subpectoral and axillary lymph nodes have decreased in size in the interval. Index left axillary lymph node measures 11 mm (2/10), compared to 1.9 cm on 07/22/2019. There may be mild diffuse esophageal wall thickening. Lungs/Pleura: Minimal biapical pleuroparenchymal scarring. Decrease in size and number of bilateral pulmonary nodules. For example, a 3 mm nodule seen in the anterior segment right upper lobe on 07/22/2019 is no  longer visualized. 12 mm medial right lower lobe nodule (4/79), decreased from 15 mm. No pleural fluid. Airway is unremarkable. Musculoskeletal: No worrisome lytic or sclerotic lesions. CT ABDOMEN PELVIS FINDINGS Hepatobiliary: Liver and gallbladder are unremarkable. No biliary ductal dilatation. Pancreas: Negative. Spleen: Negative. Adrenals/Urinary Tract: Adrenal glands are unremarkable. Low-attenuation lesions in the right kidney measure up to 1.5 cm and are likely cysts. Kidneys are otherwise unremarkable. Ureters are decompressed. Bladder is low in volume. Stomach/Bowel: Stomach, small bowel, appendix and colon are unremarkable. Vascular/Lymphatic: Atherosclerotic calcification of the aorta without aneurysm. Retroperitoneal lymph nodes have decreased in size in the interval. Index left periaortic lymph node measures 6 mm (2/76), previously 10 mm. Abdominal peritoneal ligament lymph nodes are not enlarged by CT size criteria, similar to the prior exam. Mesenteric lymph nodes have decreased in size. Index lymph node near the root of the small bowel mesentery measures 4 mm (2/66), previously 7 mm. Pelvic retroperitoneal lymph nodes have decreased in size in the interval as well. Index left external iliac lymph node measures 10 mm (2/104), previously 1.6 cm. Inguinal lymph nodes are not enlarged by CT size criteria. Reproductive: Uterus is visualized.  No adnexal mass. Other: Mild laxity of the ventral abdominal wall at the umbilicus. No free fluid. Mesenteries and peritoneum are otherwise unremarkable. Musculoskeletal: No worrisome lytic or sclerotic lesions. Mild L1 compression deformity, unchanged. IMPRESSION: 1. Slight enlargement of a high right paratracheal lymph node. Otherwise, lymph nodes in the axillary regions, abdomen and pelvis have decreased in size in the interval, consistent with treatment response. 2. Interval decrease in size and number of pulmonary nodules, indicative of metastatic disease. 3.  There may be mild nonspecific diffuse esophageal wall thickening. 4. Aortic atherosclerosis (ICD10-I70.0). Coronary artery calcification. 5. Enlarged pulmonic trunk, indicative of arterial hypertension. Electronically Signed   By: Lorin Picket M.D.   On: 10/12/2019 13:05   CT Abdomen Pelvis W Contrast  Result Date: 10/12/2019 CLINICAL DATA:  CLL. New dysphagia. Ongoing chemotherapy pill. Cough with vomiting. EXAM: CT CHEST, ABDOMEN, AND PELVIS WITH CONTRAST TECHNIQUE: Multidetector CT imaging of the chest, abdomen and pelvis was performed following the standard protocol during bolus administration of intravenous contrast. CONTRAST:  180mL OMNIPAQUE IOHEXOL 300 MG/ML  SOLN COMPARISON:  07/22/2019. FINDINGS: CT CHEST FINDINGS Cardiovascular: Right IJ Port-A-Cath terminates in the high right atrium. Atherosclerotic calcification of the aorta and coronary arteries. Pulmonic trunk is enlarged. Heart is at the upper limits of normal in size to mildly enlarged. No pericardial effusion. Mediastinum/Nodes: High right paratracheal lymph node is enlarged slightly now measuring 7 mm (2/12) previously 4 mm. No additional pathologically enlarged mediastinal or hilar lymph nodes. Subpectoral and axillary lymph nodes have decreased in size in the interval. Index left axillary lymph node measures 11 mm (2/10), compared to 1.9 cm on 07/22/2019. There may be mild diffuse esophageal wall thickening. Lungs/Pleura: Minimal biapical pleuroparenchymal scarring. Decrease in size and number of bilateral pulmonary nodules. For example, a 3 mm nodule seen in the anterior segment right upper lobe on 07/22/2019 is no longer visualized. 12 mm medial right lower lobe nodule (4/79),  decreased from 15 mm. No pleural fluid. Airway is unremarkable. Musculoskeletal: No worrisome lytic or sclerotic lesions. CT ABDOMEN PELVIS FINDINGS Hepatobiliary: Liver and gallbladder are unremarkable. No biliary ductal dilatation. Pancreas: Negative. Spleen:  Negative. Adrenals/Urinary Tract: Adrenal glands are unremarkable. Low-attenuation lesions in the right kidney measure up to 1.5 cm and are likely cysts. Kidneys are otherwise unremarkable. Ureters are decompressed. Bladder is low in volume. Stomach/Bowel: Stomach, small bowel, appendix and colon are unremarkable. Vascular/Lymphatic: Atherosclerotic calcification of the aorta without aneurysm. Retroperitoneal lymph nodes have decreased in size in the interval. Index left periaortic lymph node measures 6 mm (2/76), previously 10 mm. Abdominal peritoneal ligament lymph nodes are not enlarged by CT size criteria, similar to the prior exam. Mesenteric lymph nodes have decreased in size. Index lymph node near the root of the small bowel mesentery measures 4 mm (2/66), previously 7 mm. Pelvic retroperitoneal lymph nodes have decreased in size in the interval as well. Index left external iliac lymph node measures 10 mm (2/104), previously 1.6 cm. Inguinal lymph nodes are not enlarged by CT size criteria. Reproductive: Uterus is visualized.  No adnexal mass. Other: Mild laxity of the ventral abdominal wall at the umbilicus. No free fluid. Mesenteries and peritoneum are otherwise unremarkable. Musculoskeletal: No worrisome lytic or sclerotic lesions. Mild L1 compression deformity, unchanged. IMPRESSION: 1. Slight enlargement of a high right paratracheal lymph node. Otherwise, lymph nodes in the axillary regions, abdomen and pelvis have decreased in size in the interval, consistent with treatment response. 2. Interval decrease in size and number of pulmonary nodules, indicative of metastatic disease. 3. There may be mild nonspecific diffuse esophageal wall thickening. 4. Aortic atherosclerosis (ICD10-I70.0). Coronary artery calcification. 5. Enlarged pulmonic trunk, indicative of arterial hypertension. Electronically Signed   By: Lorin Picket M.D.   On: 10/12/2019 13:05      Active Problems:   CLL (chronic  lymphocytic leukemia) ([place])   Dysphagia   Dehydration     LOS: 1 day   Tye Savoy ,NP 10/14/2019, 11:00 AM

## 2019-10-14 NOTE — Progress Notes (Signed)
Initial Nutrition Assessment  RD working remotely.   DOCUMENTATION CODES:   Not applicable  INTERVENTION:  - will order Boost Breeze TID, each supplement provides 250 kcal and 9 grams of protein. - will order 30 mL Prostat BID, each supplement provides 100 kcal and 15 grams of protein. - will order daily multivitamin with minerals.  - diet advancement as medically feasible.    NUTRITION DIAGNOSIS:   Increased nutrient needs related to acute illness, cancer and cancer related treatments as evidenced by estimated needs.  GOAL:   Patient will meet greater than or equal to 90% of their needs  MONITOR:   PO intake, Supplement acceptance, Diet advancement, Labs, Weight trends  REASON FOR ASSESSMENT:   Consult Calorie Count  ASSESSMENT:   59 year-old female with medical history of afib, depression, diffuse cystic mastopathy, dyslipidemia, HTN, iron deficiency anemia, lymphadenopathy of head and neck, medical non-compliance, and mitral regurgitation.  Diet was advanced from CLD to Dysphagia 3, thin liquids today at 1629. She was able to consume 50% of breakfast this AM. Calorie Count ordered. RD will follow-up with results on 1/11 and 1/12. Per chart review, current weight is 188 lb and weight on 12/1 was 201 lb. This indicates 13 lb weight loss (6.5% body weight) in the past 5 weeks; significant for time frame.   Per notes: - chronic lymphocytic leukemia (CLL)--in ability to take oral chemo recently d/t inability to swallow - severe dysphagia with nearly 0% intakes over the past 1 week - severe hypokalemia - mild pancytopenia - severe constipation--bowel regimen ordered     Labs reviewed; K: 2.9 mmol/l. Medications reviewed; 10 ml magic mouthwash TID, 1 packet miralax/day, 10 mEq IV KCl x4 runs 1/8.     NUTRITION - FOCUSED PHYSICAL EXAM:  unable to complete at this time.   Diet Order:   Diet Order            Diet clear liquid Room service appropriate? Yes; Fluid  consistency: Thin  Diet effective now              EDUCATION NEEDS:   No education needs have been identified at this time  Skin:  Skin Assessment: Reviewed RN Assessment  Last BM:  PTA/unknown  Height:   Ht Readings from Last 1 Encounters:  10/03/19 5\' 7"  (1.702 m)    Weight:   Wt Readings from Last 1 Encounters:  10/13/19 85.1 kg    Ideal Body Weight:  61.4 kg  BMI:  Body mass index is 29.38 kg/m.  Estimated Nutritional Needs:   Kcal:  2130-2380 kcal  Protein:  105-120 grams  Fluid:  >/= 2.1 L/day     Jarome Matin, MS, RD, LDN, [facility] Inpatient Clinical Dietitian Pager # [phone] After hours/weekend pager # [phone]

## 2019-10-15 LAB — BASIC METABOLIC PANEL
Anion gap: 12 (ref 5–15)
BUN: 7 mg/dL (ref 6–20)
CO2: 20 mmol/L — ABNORMAL LOW (ref 22–32)
Calcium: 8.8 mg/dL — ABNORMAL LOW (ref 8.9–10.3)
Chloride: 112 mmol/L — ABNORMAL HIGH (ref 98–111)
Creatinine, Ser: 0.68 mg/dL (ref 0.44–1.00)
GFR calc Af Amer: >60 mL/min (ref >60)
GFR calc non Af Amer: >60 mL/min (ref >60)
Glucose, Bld: 132 mg/dL — ABNORMAL HIGH (ref 70–99)
Potassium: 3.1 mmol/L — ABNORMAL LOW (ref 3.5–5.1)
Sodium: 144 mmol/L (ref 135–145)

## 2019-10-15 MED ORDER — PROMETHAZINE HCL 25 MG/ML IJ SOLN
12.5000 mg | Freq: Four times a day (QID) | INTRAMUSCULAR | Status: DC | PRN
  Administered 2019-10-15 – 2019-10-16 (×2): 12.5 mg via INTRAVENOUS
  Filled 2019-10-15 (×2): qty 1

## 2019-10-15 MED ORDER — POTASSIUM CHLORIDE IN NACL 40-0.9 MEQ/L-% IV SOLN
INTRAVENOUS | Status: DC
  Administered 2019-10-15 – 2019-10-18 (×5): 100 mL/h via INTRAVENOUS
  Administered 2019-10-19: 50 mL/h via INTRAVENOUS
  Filled 2019-10-15 (×9): qty 1000

## 2019-10-15 NOTE — Progress Notes (Signed)
  Speech Language Pathology Treatment: Dysphagia  Patient Details Name: LEYLANI GARGIS MRN: [id number] DOB: September 25, 1961 Today's Date: 10/15/2019 Time: 1705-1730 SLP Time Calculation (min) (ACUTE ONLY): 25 min  Assessment / Plan / Recommendation Clinical Impression  Patient seen to address dysphagia goals. No PO's given as patient politely declined. Session focused on patient education regarding swallow function, strategies to mitigate discomfort and difficulty with swallowing, as well as education regarding MBS that was completed yesterday. Despite SLP providing full information of specific parts of MBS study, patient denied that she had this test, "the nurse said it was cancelled" , even when SLP asked, "do you remember drinking the white liquid?". She said she has not been out of her hospital room since Friday. SLP re-educated patient on swallow test and recommendations. She will definitely need f/u as SLP questions her ability to retain information based on today's discussion.     HPI HPI: LEKEYA JAMBOR is a 59 y.o. female with chronic lymphocytic leukemia. Per chart saw Dr. Ardis Hughs for dysphagia in 2018 for evaluation of dysphagia. Per chart, chemotherapy has been on hold due to her inability to swallow, odonophagia and seen  week ago for  evaluation of nausea, vomiting. Additionally any thing she eats gets stuck and she subsequently vomits it up. This is not like the dysphagia she had in 2018, it is much worse. Chest CT suggests mild non-specific esophageal wall thickening. Suspect oral / esophageal candida but viral etiologies such as HSV, CMV cannot be excluded. Some mild improvement with Diflucan + Magic mouthwash      SLP Plan  Continue with current plan of care       Recommendations  Diet recommendations: Dysphagia 3 (mechanical soft);Thin liquid Liquids provided via: Cup;Straw Medication Administration: Whole meds with liquid Supervision: Patient able to self feed;Intermittent  supervision to cue for compensatory strategies Compensations: Slow rate;Small sips/bites Postural Changes and/or Swallow Maneuvers: Seated upright 90 degrees                Follow up Recommendations: Other (comment)(TBD) SLP Visit Diagnosis: Dysphagia, pharyngeal phase (R13.13) Plan: Continue with current plan of care       GO                Dannial Monarch 10/15/2019, 6:11 PM   Sonia Baller, MA, CCC-SLP Speech Therapy [facility]

## 2019-10-15 NOTE — Progress Notes (Signed)
IP PROGRESS NOTE  Subjective:   Ms. Russo continues to have dysphagia/odynophagia, but this has improved.  She is eating breakfast this morning.  Objective: Vital signs in last 24 hours: Blood pressure 139/78, pulse 83, temperature 98.2 F (36.8 C), temperature source Oral, resp. rate 16, weight 187 lb 9.8 oz (85.1 kg), last menstrual period 02/03/2017, SpO2 97 %.  Intake/Output from previous day: 01/08 0701 - 01/09 0700 In: 300 [P.O.:300] Out: 500 [Urine:500]  Physical Exam:  HEENT: White coat over the tongue, no buccal thrush Lungs: Clear bilaterally Cardiac: Regular rate and rhythm Abdomen: No hepatosplenomegaly, nontender Extremities: No leg edema   Portacath/PICC-without erythema  Lab Results: Recent Labs    10/13/19 0843 10/14/19 0844  WBC 2.4* 3.1*  HGB 12.8 11.9*  HCT 38.8 37.2  PLT 309 258    BMET Recent Labs    10/14/19 0844 10/15/19 0500  NA 145 144  K 2.9* 3.1*  CL 111 112*  CO2 20* 20*  GLUCOSE 86 132*  BUN 11 7  CREATININE 0.64 0.68  CALCIUM 8.8* 8.8*    No results found for: CEA1  Studies/Results: DG Swallowing Func-Speech Pathology  Result Date: 10/14/2019 Objective Swallowing Evaluation: Type of Study: MBS-Modified Barium Swallow Study  Patient Details Name: KRISTLYN MINNIFIELD MRN: [id number] Date of Birth: 09-10-1961 Today's Date: 10/14/2019 Time: SLP Start Time (ACUTE ONLY): [id number] -SLP Stop Time (ACUTE ONLY): [id number] SLP Time Calculation (min) (ACUTE ONLY): 17 min Past Medical History: Past Medical History: Diagnosis Date . Atrial fibrillation ([place])  . Cystic breast  . Depression  . Diffuse cystic mastopathy  . Dyslipidemia (high LDL; low HDL)  . Hypertension  . Iron deficiency anemia, unspecified  . Lymphadenopathy of head and neck 04/05/2015 . Medical non-compliance  . Mitral regurgitation 02/2017  moderate to severe . Persistent atrial fibrillation with rapid ventricular response ([place]) 07/15/2017 Past Surgical History: Past Surgical History: Procedure  Laterality Date . CARDIOVERSION N/A 04/02/2017  Procedure: CARDIOVERSION;  Surgeon: Dorothy Spark, MD;  Location: [facility] ENDOSCOPY;  Service: Cardiovascular;  Laterality: N/A; . CARDIOVERSION N/A 08/19/2017  Procedure: CARDIOVERSION;  Surgeon: Larey Dresser, MD;  Location: [place];  Service: Cardiovascular;  Laterality: N/A; . IR IMAGING GUIDED PORT INSERTION  06/29/2018 . TUBAL LIGATION   HPI: Audrey Gross is a 59 y.o. female with chronic lymphocytic leukemia. Per chart saw Dr. Ardis Hughs for dysphagia in 2018 for evaluation of dysphagia. Per chart, chemotherapy has been on hold due to her inability to swallow, odonophagia and seen  week ago for  evaluation of nausea, vomiting. Additionally any thing she eats gets stuck and she subsequently vomits it up. This is not like the dysphagia she had in 2018, it is much worse. Chest CT suggests mild non-specific esophageal wall thickening. Suspect oral / esophageal candida but viral etiologies such as HSV, CMV cannot be excluded. Some mild improvement with Diflucan + Magic mouthwash  No data recorded Assessment / Plan / Recommendation CHL IP CLINICAL IMPRESSIONS 10/14/2019 Clinical Impression Pt exhibits significant labial and facial weakness and reduced ROM. She affirmed she was told she had Bell's palsy however this therapist unable to find confirmation through documentation. Despite impairments and oral candidias she was able to functionally masticate, manipulate and transit puree, thin as well as regular texture boluses and denied odonophagia during study. Thin barium was penetrated on two trials however completely exited laryngeal vestibule during the swallow (one was close to cords). Chin tuck prevented but pt stated it hurt her neck and did  not wish to perform. Mild residue mid esophagus(MBS does not diagnose below level of UES). Discussed options for diet textures and she stated she would not eat puree and "could order soft food." Recommend Dys 3, thin, crushed  pills and allow pt to choose foods. ST will continue to see for tolerance of recommendations.   SLP Visit Diagnosis Dysphagia, pharyngeal phase (R13.13) Attention and concentration deficit following -- Frontal lobe and executive function deficit following -- Impact on safety and function Mild aspiration risk   CHL IP TREATMENT RECOMMENDATION 10/14/2019 Treatment Recommendations Therapy as outlined in treatment plan below   Prognosis 10/14/2019 Prognosis for Safe Diet Advancement Good Barriers to Reach Goals -- Barriers/Prognosis Comment -- CHL IP DIET RECOMMENDATION 10/14/2019 SLP Diet Recommendations Dysphagia 3 (Mech soft) solids;Thin liquid Liquid Administration via Cup;Straw Medication Administration Crushed with puree Compensations Slow rate;Small sips/bites Postural Changes Seated upright at 90 degrees   CHL IP OTHER RECOMMENDATIONS 10/14/2019 Recommended Consults -- Oral Care Recommendations Oral care BID Other Recommendations --   CHL IP FOLLOW UP RECOMMENDATIONS 10/14/2019 Follow up Recommendations Other (comment)   CHL IP FREQUENCY AND DURATION 10/14/2019 Speech Therapy Frequency (ACUTE ONLY) min 2x/week Treatment Duration 2 weeks      CHL IP ORAL PHASE 10/14/2019 Oral Phase WFL Oral - Pudding Teaspoon -- Oral - Pudding Cup -- Oral - Honey Teaspoon -- Oral - Honey Cup -- Oral - Nectar Teaspoon -- Oral - Nectar Cup -- Oral - Nectar Straw -- Oral - Thin Teaspoon -- Oral - Thin Cup -- Oral - Thin Straw -- Oral - Puree -- Oral - Mech Soft -- Oral - Regular -- Oral - Multi-Consistency -- Oral - Pill -- Oral Phase - Comment --  CHL IP PHARYNGEAL PHASE 10/14/2019 Pharyngeal Phase Impaired Pharyngeal- Pudding Teaspoon -- Pharyngeal -- Pharyngeal- Pudding Cup -- Pharyngeal -- Pharyngeal- Honey Teaspoon -- Pharyngeal -- Pharyngeal- Honey Cup -- Pharyngeal -- Pharyngeal- Nectar Teaspoon -- Pharyngeal -- Pharyngeal- Nectar Cup WFL;Pharyngeal residue - valleculae Pharyngeal -- Pharyngeal- Nectar Straw -- Pharyngeal -- Pharyngeal- Thin  Teaspoon -- Pharyngeal -- Pharyngeal- Thin Cup Penetration/Aspiration during swallow Pharyngeal Material enters airway, CONTACTS cords and then ejected out Pharyngeal- Thin Straw WFL Pharyngeal -- Pharyngeal- Puree -- Pharyngeal -- Pharyngeal- Mechanical Soft WFL Pharyngeal -- Pharyngeal- Regular -- Pharyngeal -- Pharyngeal- Multi-consistency -- Pharyngeal -- Pharyngeal- Pill -- Pharyngeal -- Pharyngeal Comment --  CHL IP CERVICAL ESOPHAGEAL PHASE 10/14/2019 Cervical Esophageal Phase WFL Pudding Teaspoon -- Pudding Cup -- Honey Teaspoon -- Honey Cup -- Nectar Teaspoon -- Nectar Cup -- Nectar Straw -- Thin Teaspoon -- Thin Cup -- Thin Straw -- Puree -- Mechanical Soft -- Regular -- Multi-consistency -- Pill -- Cervical Esophageal Comment -- [place] Siren 10/14/2019, 5:03 PM  Orbie Pyo Litaker M.Ed Actor Pager [phone] Office [phone]              Medications: I have reviewed the patient's current medications.  Assessment/Plan: 1.  CLL, most recent treatment with duvelisib 2.  Odynophagia-completing treatment with Diflucan for presumed candidiasis, gastroenterology is following her 3.  DVT prophylaxis-Lovenox 4.  Hypokalemia-replete IV     LOS: 2 days   Betsy Coder, MD   10/15/2019, 11:06 AM

## 2019-10-16 LAB — URINALYSIS, COMPLETE (UACMP) WITH MICROSCOPIC
Bilirubin Urine: NEGATIVE
Glucose, UA: NEGATIVE mg/dL
Hgb urine dipstick: NEGATIVE
Ketones, ur: 20 mg/dL — AB
Nitrite: NEGATIVE
Protein, ur: NEGATIVE mg/dL
Specific Gravity, Urine: 1.012 (ref 1.005–1.030)
pH: 5 (ref 5.0–8.0)

## 2019-10-16 LAB — BASIC METABOLIC PANEL
Anion gap: 11 (ref 5–15)
BUN: 5 mg/dL — ABNORMAL LOW (ref 6–20)
CO2: 21 mmol/L — ABNORMAL LOW (ref 22–32)
Calcium: 8.3 mg/dL — ABNORMAL LOW (ref 8.9–10.3)
Chloride: 112 mmol/L — ABNORMAL HIGH (ref 98–111)
Creatinine, Ser: 0.69 mg/dL (ref 0.44–1.00)
GFR calc Af Amer: >60 mL/min (ref >60)
GFR calc non Af Amer: >60 mL/min (ref >60)
Glucose, Bld: 97 mg/dL (ref 70–99)
Potassium: 3 mmol/L — ABNORMAL LOW (ref 3.5–5.1)
Sodium: 144 mmol/L (ref 135–145)

## 2019-10-16 LAB — MAGNESIUM: Magnesium: 1.7 mg/dL (ref 1.7–2.4)

## 2019-10-16 MED ORDER — PHENOL 1.4 % MT LIQD
1.0000 | OROMUCOSAL | Status: DC | PRN
  Filled 2019-10-16: qty 177

## 2019-10-16 MED ORDER — POTASSIUM CHLORIDE 10 MEQ/100ML IV SOLN
10.0000 meq | INTRAVENOUS | Status: AC
  Administered 2019-10-16 (×4): 10 meq via INTRAVENOUS
  Filled 2019-10-16 (×4): qty 100

## 2019-10-16 NOTE — Progress Notes (Addendum)
IP PROGRESS NOTE  Subjective:   Audrey Gross complains of a sore throat.  She had multiple episodes of nausea and vomiting yesterday.  She reports the nausea predated hospital admission.  Objective: Vital signs in last 24 hours: Blood pressure (!) 150/79, pulse 93, temperature 99.6 F (37.6 C), temperature source Oral, resp. rate 17, height 5\' 7"  (1.702 m), weight 187 lb 9.8 oz (85.1 kg), last menstrual period 02/03/2017, SpO2 99 %.  Intake/Output from previous day: 01/09 0701 - 01/10 0700 In: 1672.3 [P.O.:600; I.V.:922.3; IV Piggyback:150] Out: 400 [Urine:400]  Physical Exam:  HEENT: White coat over the tongue, no buccal thrush  Abdomen: No hepatosplenomegaly, tender at the right costal margin    Portacath/PICC-without erythema  Lab Results: Recent Labs    10/14/19 0844  WBC 3.1*  HGB 11.9*  HCT 37.2  PLT 258    BMET Recent Labs    10/15/19 0500 10/16/19 0601  NA 144 144  K 3.1* 3.0*  CL 112* 112*  CO2 20* 21*  GLUCOSE 132* 97  BUN 7 5*  CREATININE 0.68 0.69  CALCIUM 8.8* 8.3*  Magnesium on 10/16/2019: 1.7  No results found for: CEA1  Studies/Results: DG Swallowing Func-Speech Pathology  Result Date: 10/14/2019 Objective Swallowing Evaluation: Type of Study: MBS-Modified Barium Swallow Study  Patient Details Name: Audrey Gross MRN: [id number] Date of Birth: 1961-02-16 Today's Date: 10/14/2019 Time: SLP Start Time (ACUTE ONLY): [id number] -SLP Stop Time (ACUTE ONLY): [id number] SLP Time Calculation (min) (ACUTE ONLY): 17 min Past Medical History: Past Medical History: Diagnosis Date . Atrial fibrillation ([place])  . Cystic breast  . Depression  . Diffuse cystic mastopathy  . Dyslipidemia (high LDL; low HDL)  . Hypertension  . Iron deficiency anemia, unspecified  . Lymphadenopathy of head and neck 04/05/2015 . Medical non-compliance  . Mitral regurgitation 02/2017  moderate to severe . Persistent atrial fibrillation with rapid ventricular response ([place]) 07/15/2017 Past Surgical  History: Past Surgical History: Procedure Laterality Date . CARDIOVERSION N/A 04/02/2017  Procedure: CARDIOVERSION;  Surgeon: Dorothy Spark, MD;  Location: [facility] ENDOSCOPY;  Service: Cardiovascular;  Laterality: N/A; . CARDIOVERSION N/A 08/19/2017  Procedure: CARDIOVERSION;  Surgeon: Larey Dresser, MD;  Location: [place];  Service: Cardiovascular;  Laterality: N/A; . IR IMAGING GUIDED PORT INSERTION  06/29/2018 . TUBAL LIGATION   HPI: Audrey Gross is a 59 y.o. female with chronic lymphocytic leukemia. Per chart saw Dr. Ardis Hughs for dysphagia in 2018 for evaluation of dysphagia. Per chart, chemotherapy has been on hold due to her inability to swallow, odonophagia and seen  week ago for  evaluation of nausea, vomiting. Additionally any thing she eats gets stuck and she subsequently vomits it up. This is not like the dysphagia she had in 2018, it is much worse. Chest CT suggests mild non-specific esophageal wall thickening. Suspect oral / esophageal candida but viral etiologies such as HSV, CMV cannot be excluded. Some mild improvement with Diflucan + Magic mouthwash  No data recorded Assessment / Plan / Recommendation CHL IP CLINICAL IMPRESSIONS 10/14/2019 Clinical Impression Pt exhibits significant labial and facial weakness and reduced ROM. She affirmed she was told she had Bell's palsy however this therapist unable to find confirmation through documentation. Despite impairments and oral candidias she was able to functionally masticate, manipulate and transit puree, thin as well as regular texture boluses and denied odonophagia during study. Thin barium was penetrated on two trials however completely exited laryngeal vestibule during the swallow (one was close to cords). Chin tuck prevented  but pt stated it hurt her neck and did not wish to perform. Mild residue mid esophagus(MBS does not diagnose below level of UES). Discussed options for diet textures and she stated she would not eat puree and "could order  soft food." Recommend Dys 3, thin, crushed pills and allow pt to choose foods. ST will continue to see for tolerance of recommendations.   SLP Visit Diagnosis Dysphagia, pharyngeal phase (R13.13) Attention and concentration deficit following -- Frontal lobe and executive function deficit following -- Impact on safety and function Mild aspiration risk   CHL IP TREATMENT RECOMMENDATION 10/14/2019 Treatment Recommendations Therapy as outlined in treatment plan below   Prognosis 10/14/2019 Prognosis for Safe Diet Advancement Good Barriers to Reach Goals -- Barriers/Prognosis Comment -- CHL IP DIET RECOMMENDATION 10/14/2019 SLP Diet Recommendations Dysphagia 3 (Mech soft) solids;Thin liquid Liquid Administration via Cup;Straw Medication Administration Crushed with puree Compensations Slow rate;Small sips/bites Postural Changes Seated upright at 90 degrees   CHL IP OTHER RECOMMENDATIONS 10/14/2019 Recommended Consults -- Oral Care Recommendations Oral care BID Other Recommendations --   CHL IP FOLLOW UP RECOMMENDATIONS 10/14/2019 Follow up Recommendations Other (comment)   CHL IP FREQUENCY AND DURATION 10/14/2019 Speech Therapy Frequency (ACUTE ONLY) min 2x/week Treatment Duration 2 weeks      CHL IP ORAL PHASE 10/14/2019 Oral Phase WFL Oral - Pudding Teaspoon -- Oral - Pudding Cup -- Oral - Honey Teaspoon -- Oral - Honey Cup -- Oral - Nectar Teaspoon -- Oral - Nectar Cup -- Oral - Nectar Straw -- Oral - Thin Teaspoon -- Oral - Thin Cup -- Oral - Thin Straw -- Oral - Puree -- Oral - Mech Soft -- Oral - Regular -- Oral - Multi-Consistency -- Oral - Pill -- Oral Phase - Comment --  CHL IP PHARYNGEAL PHASE 10/14/2019 Pharyngeal Phase Impaired Pharyngeal- Pudding Teaspoon -- Pharyngeal -- Pharyngeal- Pudding Cup -- Pharyngeal -- Pharyngeal- Honey Teaspoon -- Pharyngeal -- Pharyngeal- Honey Cup -- Pharyngeal -- Pharyngeal- Nectar Teaspoon -- Pharyngeal -- Pharyngeal- Nectar Cup WFL;Pharyngeal residue - valleculae Pharyngeal -- Pharyngeal-  Nectar Straw -- Pharyngeal -- Pharyngeal- Thin Teaspoon -- Pharyngeal -- Pharyngeal- Thin Cup Penetration/Aspiration during swallow Pharyngeal Material enters airway, CONTACTS cords and then ejected out Pharyngeal- Thin Straw WFL Pharyngeal -- Pharyngeal- Puree -- Pharyngeal -- Pharyngeal- Mechanical Soft WFL Pharyngeal -- Pharyngeal- Regular -- Pharyngeal -- Pharyngeal- Multi-consistency -- Pharyngeal -- Pharyngeal- Pill -- Pharyngeal -- Pharyngeal Comment --  CHL IP CERVICAL ESOPHAGEAL PHASE 10/14/2019 Cervical Esophageal Phase WFL Pudding Teaspoon -- Pudding Cup -- Honey Teaspoon -- Honey Cup -- Nectar Teaspoon -- Nectar Cup -- Nectar Straw -- Thin Teaspoon -- Thin Cup -- Thin Straw -- Puree -- Mechanical Soft -- Regular -- Multi-consistency -- Pill -- Cervical Esophageal Comment -- Audrey Gross 10/14/2019, 5:03 PM  Orbie Pyo Litaker M.Ed Actor Pager [phone] Office [phone]              Medications: I have reviewed the patient's current medications.  Assessment/Plan: 1.  CLL, most recent treatment with duvelisib 2.  Odynophagia-completing treatment with Diflucan for presumed candidiasis, gastroenterology is following her 3.  DVT prophylaxis-Lovenox 4.  Hypokalemia-replete IV  Audrey Gross continues to have odynophagia, nausea, and regurgitation.  The etiology of her symptoms remains unclear.  She is scheduled for an upper endoscopy tomorrow.  I prescribed Chloraseptic spray.  She has persistent hypokalemia despite receiving potassium in the intravenous hydration.  The hypokalemia may be related to recent use of duvelisib.  She has a  low-grade fever this morning.  We will check a urinalysis.  We will repeat a CBC with tomorrow's a.m. lab.   LOS: 3 days   Betsy Coder, MD   10/16/2019, 10:24 AM

## 2019-10-16 NOTE — Progress Notes (Addendum)
[name]    Progress Note    ASSESSMENT AND PLAN:   59 yo female with PMHsignificant for chronic lymphoblastic leukemia, hyperlipidemia, hypertension and atrial fibrillation on Eliquis.  1. Odynophagia / dysphagia in setting of recent antibiotics, steroids/ chemotherapy. White coating on tongue on day of consult. Chest CT scan suggests mild non-specific esophageal wall thickening. Suspect oral / esophageal candida but viral etiologies such as HSV, CMV cannot be excluded. Odynophagia has improved but complains of persistent dysphagia. MBSS- no significant oropharyngeal dysphagia --She says everything she eats gets stuck with subsequent regurgitation. Will put back on clears and plan for EGD tomorrow. The risks and benefits of EGD were discussed and the patient agrees to proceed.  --Continue mouthwash with lidocaine --Continue IV Fluconazole.  2. CLL, on chemotherapy. Chest /abd / pelvic CT scan >> decrase in size and number of pulmonary nodules, slight enlargement of a high right paratracheal lymph node. Doubt node large enough to be contributing to dysphagia.    3. Persistent hypokalemia --I'm gong to give 4 runs of K+. If hypokalemic in am then may be unable to proceed with EGD  4. Afib, home Eliquis is on hold.    5. DVT prophylaxis.  -I am stopping Lovenox after tonight. We can resume post EGD SUBJECTIVE    Not nearly as painful to swallow but everything she eats is still getting stuck in esophagus   OBJECTIVE:     Vital signs in last 24 hours: Temp:  [98.8 F (37.1 C)-99.9 F (37.7 C)] 99.6 F (37.6 C) (01/10 0610) Pulse Rate:  [89-95] 93 (01/10 0610) Resp:  [16-17] 17 (01/10 0610) BP: (146-162)/(79-83) 150/79 (01/10 0610) SpO2:  [99 %] 99 % (01/10 0610) Weight:  [85.1 kg] 85.1 kg (01/09 2300) Last BM Date: 10/15/19 General:   Alert, well-developed female in NAD Heart:  Regular rate and rhythm; + murmur, no lower extremity edema   Pulm: Normal  respiratory effort   Abdomen:  Soft, nondistended, nontender.  Normal bowel sounds.          Neurologic:  Alert and  oriented x4;  grossly normal neurologically. Psych:  Pleasant, cooperative.  Normal mood and affect.   Intake/Output from previous day: 01/09 0701 - 01/10 0700 In: 1672.3 [P.O.:600; I.V.:922.3; IV Piggyback:150] Out: 400 [Urine:400] Intake/Output this shift: Total I/O In: -  Out: 800 [Urine:800]  Lab Results: Recent Labs    10/14/19 0844  WBC 3.1*  HGB 11.9*  HCT 37.2  PLT 258   BMET Recent Labs    10/14/19 0844 10/15/19 0500 10/16/19 0601  NA 145 144 144  K 2.9* 3.1* 3.0*  CL 111 112* 112*  CO2 20* 20* 21*  GLUCOSE 86 132* 97  BUN 11 7 5*  CREATININE 0.64 0.68 0.69  CALCIUM 8.8* 8.8* 8.3*    DG Swallowing Func-Speech Pathology  Result Date: 10/14/2019 Objective Swallowing Evaluation: Type of Study: MBS-Modified Barium Swallow Study  Patient Details Name: Audrey Gross MRN: [id number] Date of Birth: 06-11-1961 Today's Date: 10/14/2019 Time: SLP Start Time (ACUTE ONLY): 55 -SLP Stop Time (ACUTE ONLY): 1453 SLP Time Calculation (min) (ACUTE ONLY): 17 min Past Medical History: Past Medical History: Diagnosis Date  Atrial fibrillation ([place])   Cystic breast   Depression   Diffuse cystic mastopathy   Dyslipidemia (high LDL; low HDL)   Hypertension   Iron deficiency anemia, unspecified   Lymphadenopathy of head and neck 04/05/2015  Medical non-compliance   Mitral regurgitation 02/2017  moderate to severe  Persistent atrial fibrillation with rapid ventricular response ([place]) 07/15/2017 Past Surgical History: Past Surgical History: Procedure Laterality Date  CARDIOVERSION N/A 04/02/2017  Procedure: CARDIOVERSION;  Surgeon: Dorothy Spark, MD;  Location: [facility] ENDOSCOPY;  Service: Cardiovascular;  Laterality: N/A;  CARDIOVERSION N/A 08/19/2017  Procedure: CARDIOVERSION;  Surgeon: Larey Dresser, MD;  Location: [facility] ENDOSCOPY;  Service: Cardiovascular;   Laterality: N/A;  IR IMAGING GUIDED PORT INSERTION  06/29/2018  TUBAL LIGATION   HPI: Audrey Gross is a 59 y.o. female with chronic lymphocytic leukemia. Per chart saw Dr. Ardis Hughs for dysphagia in 2018 for evaluation of dysphagia. Per chart, chemotherapy has been on hold due to her inability to swallow, odonophagia and seen  week ago for  evaluation of nausea, vomiting. Additionally any thing she eats gets stuck and she subsequently vomits it up. This is not like the dysphagia she had in 2018, it is much worse. Chest CT suggests mild non-specific esophageal wall thickening. Suspect oral / esophageal candida but viral etiologies such as HSV, CMV cannot be excluded. Some mild improvement with Diflucan + Magic mouthwash  No data recorded Assessment / Plan / Recommendation CHL IP CLINICAL IMPRESSIONS 10/14/2019 Clinical Impression Pt exhibits significant labial and facial weakness and reduced ROM. She affirmed she was told she had Bell's palsy however this therapist unable to find confirmation through documentation. Despite impairments and oral candidias she was able to functionally masticate, manipulate and transit puree, thin as well as regular texture boluses and denied odonophagia during study. Thin barium was penetrated on two trials however completely exited laryngeal vestibule during the swallow (one was close to cords). Chin tuck prevented but pt stated it hurt her neck and did not wish to perform. Mild residue mid esophagus(MBS does not diagnose below level of UES). Discussed options for diet textures and she stated she would not eat puree and "could order soft food." Recommend Dys 3, thin, crushed pills and allow pt to choose foods. ST will continue to see for tolerance of recommendations.   SLP Visit Diagnosis Dysphagia, pharyngeal phase (R13.13) Attention and concentration deficit following -- Frontal lobe and executive function deficit following -- Impact on safety and function Mild aspiration risk   CHL IP  TREATMENT RECOMMENDATION 10/14/2019 Treatment Recommendations Therapy as outlined in treatment plan below   Prognosis 10/14/2019 Prognosis for Safe Diet Advancement Good Barriers to Reach Goals -- Barriers/Prognosis Comment -- CHL IP DIET RECOMMENDATION 10/14/2019 SLP Diet Recommendations Dysphagia 3 (Mech soft) solids;Thin liquid Liquid Administration via Cup;Straw Medication Administration Crushed with puree Compensations Slow rate;Small sips/bites Postural Changes Seated upright at 90 degrees   CHL IP OTHER RECOMMENDATIONS 10/14/2019 Recommended Consults -- Oral Care Recommendations Oral care BID Other Recommendations --   CHL IP FOLLOW UP RECOMMENDATIONS 10/14/2019 Follow up Recommendations Other (comment)   CHL IP FREQUENCY AND DURATION 10/14/2019 Speech Therapy Frequency (ACUTE ONLY) min 2x/week Treatment Duration 2 weeks      CHL IP ORAL PHASE 10/14/2019 Oral Phase WFL Oral - Pudding Teaspoon -- Oral - Pudding Cup -- Oral - Honey Teaspoon -- Oral - Honey Cup -- Oral - Nectar Teaspoon -- Oral - Nectar Cup -- Oral - Nectar Straw -- Oral - Thin Teaspoon -- Oral - Thin Cup -- Oral - Thin Straw -- Oral - Puree -- Oral - Mech Soft -- Oral - Regular -- Oral - Multi-Consistency -- Oral - Pill -- Oral Phase - Comment --  CHL IP PHARYNGEAL PHASE 10/14/2019 Pharyngeal Phase Impaired Pharyngeal- Pudding Teaspoon -- Pharyngeal -- Pharyngeal-  Pudding Cup -- Pharyngeal -- Pharyngeal- Honey Teaspoon -- Pharyngeal -- Pharyngeal- Honey Cup -- Pharyngeal -- Pharyngeal- Nectar Teaspoon -- Pharyngeal -- Pharyngeal- Nectar Cup [facility];Pharyngeal residue - valleculae Pharyngeal -- Pharyngeal- Nectar Straw -- Pharyngeal -- Pharyngeal- Thin Teaspoon -- Pharyngeal -- Pharyngeal- Thin Cup Penetration/Aspiration during swallow Pharyngeal Material enters airway, CONTACTS cords and then ejected out Pharyngeal- Thin Straw WFL Pharyngeal -- Pharyngeal- Puree -- Pharyngeal -- Pharyngeal- Mechanical Soft WFL Pharyngeal -- Pharyngeal- Regular -- Pharyngeal --  Pharyngeal- Multi-consistency -- Pharyngeal -- Pharyngeal- Pill -- Pharyngeal -- Pharyngeal Comment --  CHL IP CERVICAL ESOPHAGEAL PHASE 10/14/2019 Cervical Esophageal Phase WFL Pudding Teaspoon -- Pudding Cup -- Honey Teaspoon -- Honey Cup -- Nectar Teaspoon -- Nectar Cup -- Nectar Straw -- Thin Teaspoon -- Thin Cup -- Thin Straw -- Puree -- Mechanical Soft -- Regular -- Multi-consistency -- Pill -- Cervical Esophageal Comment -- [place] Siren 10/14/2019, 5:03 PM  Orbie Pyo Litaker M.Ed Actor Pager [phone] Office [phone]              Active Problems:   CLL (chronic lymphocytic leukemia) ([place])   Dysphagia   Dehydration     LOS: 3 days   Tye Savoy ,NP 10/16/2019, 9:28 AM  GI ATTENDING  Interval history and data reviewed.  Patient personally seen and examined.  Agree with interval progress note as outlined above.  Apparently has improvement in odynophagia after initiating fluconazole therapy.  Still complaining of moderate dysphagia.  We will proceed with upper endoscopy with possible esophageal dilation tomorrow morning.The nature of the procedure, as well as the risks, benefits, and alternatives were carefully and thoroughly reviewed with the patient. Ample time for discussion and questions allowed. The patient understood, was satisfied, and agreed to proceed.  Lovenox being held.  Hypokalemia being corrected.  Docia Chuck. Geri Seminole., M.D. [facility] Division of Gastroenterology

## 2019-10-17 ENCOUNTER — Encounter (HOSPITAL_COMMUNITY)
Admission: AD | Disposition: A | Discharge status: Home / Self Care | Payer: Self-pay | Source: Ambulatory Visit | Attending: Hematology and Oncology

## 2019-10-17 ENCOUNTER — Encounter (HOSPITAL_COMMUNITY): Payer: Self-pay | Admitting: Hematology and Oncology

## 2019-10-17 ENCOUNTER — Inpatient Hospital Stay (HOSPITAL_COMMUNITY): Payer: BC Managed Care – PPO | Admitting: Certified Registered Nurse Anesthetist

## 2019-10-17 DIAGNOSIS — K2970 Gastritis, unspecified, without bleeding: Secondary | ICD-10-CM | POA: Diagnosis not present

## 2019-10-17 DIAGNOSIS — R1310 Dysphagia, unspecified: Secondary | ICD-10-CM | POA: Diagnosis not present

## 2019-10-17 DIAGNOSIS — K2950 Unspecified chronic gastritis without bleeding: Secondary | ICD-10-CM | POA: Diagnosis not present

## 2019-10-17 DIAGNOSIS — K209 Esophagitis, unspecified without bleeding: Secondary | ICD-10-CM

## 2019-10-17 DIAGNOSIS — K2210 Ulcer of esophagus without bleeding: Secondary | ICD-10-CM | POA: Diagnosis not present

## 2019-10-17 DIAGNOSIS — I4891 Unspecified atrial fibrillation: Secondary | ICD-10-CM | POA: Diagnosis not present

## 2019-10-17 DIAGNOSIS — K208 Other esophagitis without bleeding: Secondary | ICD-10-CM | POA: Diagnosis not present

## 2019-10-17 DIAGNOSIS — I10 Essential (primary) hypertension: Secondary | ICD-10-CM | POA: Diagnosis not present

## 2019-10-17 DIAGNOSIS — E876 Hypokalemia: Secondary | ICD-10-CM | POA: Diagnosis not present

## 2019-10-17 DIAGNOSIS — K2980 Duodenitis without bleeding: Secondary | ICD-10-CM | POA: Diagnosis not present

## 2019-10-17 DIAGNOSIS — C9110 Chronic lymphocytic leukemia of B-cell type not having achieved remission: Secondary | ICD-10-CM | POA: Diagnosis not present

## 2019-10-17 DIAGNOSIS — D61818 Other pancytopenia: Secondary | ICD-10-CM | POA: Diagnosis not present

## 2019-10-17 DIAGNOSIS — K3189 Other diseases of stomach and duodenum: Secondary | ICD-10-CM

## 2019-10-17 HISTORY — PX: ESOPHAGOGASTRODUODENOSCOPY (EGD) WITH PROPOFOL: SHX5813

## 2019-10-17 HISTORY — PX: BIOPSY: SHX5522

## 2019-10-17 LAB — CBC WITH DIFFERENTIAL/PLATELET
Abs Immature Granulocytes: 0.24 10*3/uL — ABNORMAL HIGH (ref 0.00–0.07)
Basophils Absolute: 0.1 10*3/uL (ref 0.0–0.1)
Basophils Relative: 3 %
Eosinophils Absolute: 0.1 10*3/uL (ref 0.0–0.5)
Eosinophils Relative: 2 %
HCT: 32.5 % — ABNORMAL LOW (ref 36.0–46.0)
Hemoglobin: 10.2 g/dL — ABNORMAL LOW (ref 12.0–15.0)
Immature Granulocytes: 10 %
Lymphocytes Relative: 31 %
Lymphs Abs: 0.8 10*3/uL (ref 0.7–4.0)
MCH: 28.5 pg (ref 26.0–34.0)
MCHC: 31.4 g/dL (ref 30.0–36.0)
MCV: 90.8 fL (ref 80.0–100.0)
Monocytes Absolute: 0.7 10*3/uL (ref 0.1–1.0)
Monocytes Relative: 29 %
Neutro Abs: 0.6 10*3/uL — ABNORMAL LOW (ref 1.7–7.7)
Neutrophils Relative %: 25 %
Platelets: 210 10*3/uL (ref 150–400)
RBC: 3.58 MIL/uL — ABNORMAL LOW (ref 3.87–5.11)
RDW: 13.6 % (ref 11.5–15.5)
WBC: 2.5 10*3/uL — ABNORMAL LOW (ref 4.0–10.5)
nRBC: 0 % (ref 0.0–0.2)

## 2019-10-17 LAB — BASIC METABOLIC PANEL
Anion gap: 10 (ref 5–15)
BUN: 5 mg/dL — ABNORMAL LOW (ref 6–20)
CO2: 21 mmol/L — ABNORMAL LOW (ref 22–32)
Calcium: 8.6 mg/dL — ABNORMAL LOW (ref 8.9–10.3)
Chloride: 114 mmol/L — ABNORMAL HIGH (ref 98–111)
Creatinine, Ser: 0.6 mg/dL (ref 0.44–1.00)
GFR calc Af Amer: >60 mL/min (ref >60)
GFR calc non Af Amer: >60 mL/min (ref >60)
Glucose, Bld: 89 mg/dL (ref 70–99)
Potassium: 3.4 mmol/L — ABNORMAL LOW (ref 3.5–5.1)
Sodium: 145 mmol/L (ref 135–145)

## 2019-10-17 SURGERY — ESOPHAGOGASTRODUODENOSCOPY (EGD) WITH PROPOFOL
Anesthesia: Monitor Anesthesia Care

## 2019-10-17 MED ORDER — LIDOCAINE 2% (20 MG/ML) 5 ML SYRINGE
INTRAMUSCULAR | Status: DC | PRN
  Administered 2019-10-17: 80 mg via INTRAVENOUS

## 2019-10-17 MED ORDER — PROPOFOL 10 MG/ML IV BOLUS
INTRAVENOUS | Status: AC
  Filled 2019-10-17: qty 20

## 2019-10-17 MED ORDER — LACTATED RINGERS IV SOLN: INTRAVENOUS | Status: DC

## 2019-10-17 MED ORDER — PANTOPRAZOLE SODIUM 40 MG IV SOLR
40.0000 mg | Freq: Two times a day (BID) | INTRAVENOUS | Status: DC
  Administered 2019-10-17 – 2019-10-19 (×4): 40 mg via INTRAVENOUS
  Filled 2019-10-17 (×4): qty 40

## 2019-10-17 MED ORDER — FAMOTIDINE IN NACL 20-0.9 MG/50ML-% IV SOLN
20.0000 mg | Freq: Two times a day (BID) | INTRAVENOUS | Status: DC
  Administered 2019-10-17 – 2019-10-19 (×5): 20 mg via INTRAVENOUS
  Filled 2019-10-17 (×5): qty 50

## 2019-10-17 MED ORDER — POLYETHYLENE GLYCOL 3350 17 G PO PACK: 17.0000 g | PACK | Freq: Two times a day (BID) | ORAL | Status: DC

## 2019-10-17 MED ORDER — METOPROLOL SUCCINATE ER 50 MG PO TB24
100.0000 mg | ORAL_TABLET | Freq: Every day | ORAL | Status: DC
  Administered 2019-10-17 – 2019-10-19 (×3): 100 mg via ORAL
  Filled 2019-10-17 (×3): qty 2

## 2019-10-17 MED ORDER — ENSURE ENLIVE PO LIQD
237.0000 mL | Freq: Two times a day (BID) | ORAL | Status: DC
  Administered 2019-10-18 (×2): 237 mL via ORAL

## 2019-10-17 MED ORDER — PROPOFOL 500 MG/50ML IV EMUL
INTRAVENOUS | Status: AC
  Filled 2019-10-17: qty 100

## 2019-10-17 MED ORDER — BISACODYL 5 MG PO TBEC: 10.0000 mg | DELAYED_RELEASE_TABLET | Freq: Once | ORAL | Status: DC

## 2019-10-17 MED ORDER — POLYETHYLENE GLYCOL 3350 17 G PO PACK
17.0000 g | PACK | Freq: Every day | ORAL | Status: DC
  Administered 2019-10-18 – 2019-10-19 (×2): 17 g via ORAL
  Filled 2019-10-17 (×2): qty 1

## 2019-10-17 MED ORDER — MAGIC MOUTHWASH W/LIDOCAINE
10.0000 mL | Freq: Three times a day (TID) | ORAL | Status: DC
  Administered 2019-10-17 – 2019-10-19 (×9): 10 mL via ORAL
  Filled 2019-10-17 (×13): qty 10

## 2019-10-17 MED ORDER — PROPOFOL 10 MG/ML IV BOLUS
INTRAVENOUS | Status: DC | PRN
  Administered 2019-10-17: 20 mg via INTRAVENOUS

## 2019-10-17 MED ORDER — PROPOFOL 500 MG/50ML IV EMUL
INTRAVENOUS | Status: DC | PRN
  Administered 2019-10-17: 125 ug/kg/min via INTRAVENOUS

## 2019-10-17 SURGICAL SUPPLY — 15 items

## 2019-10-17 NOTE — Anesthesia Preprocedure Evaluation (Signed)
Anesthesia Evaluation  Patient identified by MRN, date of birth, ID band Patient awake    Reviewed: Allergy & Precautions, NPO status , Patient's Chart, lab work & pertinent test results  Airway Mallampati: II  TM Distance: >3 FB Neck ROM: Full    Dental  (+) Dental Advisory Given, Poor Dentition   Pulmonary neg pulmonary ROS,    Pulmonary exam normal breath sounds clear to auscultation       Cardiovascular hypertension, Normal cardiovascular exam+ dysrhythmias Atrial Fibrillation + Valvular Problems/Murmurs MR  Rhythm:Regular Rate:Normal     Neuro/Psych PSYCHIATRIC DISORDERS Depression negative neurological ROS     GI/Hepatic Neg liver ROS, Dysphagia    Endo/Other  negative endocrine ROS  Renal/GU negative Renal ROS     Musculoskeletal negative musculoskeletal ROS (+)   Abdominal   Peds  Hematology  (+) Blood dyscrasia (Eliquis), anemia , CLL   Anesthesia Other Findings Day of surgery medications reviewed with the patient.  Reproductive/Obstetrics                             Anesthesia Physical Anesthesia Plan  ASA: III  Anesthesia Plan: MAC   Post-op Pain Management:    Induction: Intravenous  PONV Risk Score and Plan: 2 and Propofol infusion and Treatment may vary due to age or medical condition  Airway Management Planned: Nasal Cannula and Natural Airway  Additional Equipment:   Intra-op Plan:   Post-operative Plan:   Informed Consent: I have reviewed the patients History and Physical, chart, labs and discussed the procedure including the risks, benefits and alternatives for the proposed anesthesia with the patient or authorized representative who has indicated his/her understanding and acceptance.     Dental advisory given  Plan Discussed with: CRNA and Anesthesiologist  Anesthesia Plan Comments:         Anesthesia Quick Evaluation

## 2019-10-17 NOTE — Progress Notes (Signed)
Consent provided for EGD. Pt denied any further questions or concerns. Consent signed and in the pt's chart.

## 2019-10-17 NOTE — Interval H&P Note (Signed)
History and Physical Interval Note: For EGD today to eval dysphagia/odynophagia The nature of the procedure, as well as the risks, benefits, and alternatives were carefully and thoroughly reviewed with the patient. Ample time for discussion and questions allowed. The patient understood, was satisfied, and agreed to proceed.  JMP   10/17/2019 11:34 AM  Audrey Gross  has presented today for surgery, with the diagnosis of dysphagia.  The various methods of treatment have been discussed with the patient and family. After consideration of risks, benefits and other options for treatment, the patient has consented to  Procedure(s): ESOPHAGOGASTRODUODENOSCOPY (EGD) WITH PROPOFOL (N/A) as a surgical intervention.  The patient's history has been reviewed, patient examined, no change in status, stable for surgery.  I have reviewed the patient's chart and labs.  Questions were answered to the patient's satisfaction.     Lajuan Lines [name]

## 2019-10-17 NOTE — Progress Notes (Addendum)
Patient ID: Audrey Gross, female   DOB: 05/31/1961, 59 y.o.   MRN: [id number]    Progress Note   Subjective  Day # 4 CC: Dysphagia/ odynophagia  Patient says she feels about the same, persistent dysphagia and odynophagia.  Feels that food able to traverse to her stomach but "comes right back up".  IV Diflucan  WBC 2.5, hemoglobin 10.2 Potassium 3.4   Objective   Vital signs in last 24 hours: Temp:  [98.3 F (36.8 C)-100 F (37.8 C)] 98.3 F (36.8 C) (01/11 [id number]) Pulse Rate:  [86-109] 86 (01/11 0845) Resp:  [12-17] 17 (01/11 0608) BP: (140-162)/(84-90) 142/84 (01/11 0845) SpO2:  [97 %-99 %] 97 % (01/11 [id number]) Weight:  [84.1 kg] 84.1 kg (01/11 0608) Last BM Date: 10/15/19 General:    African-American female in NAD Heart:  Regular rate and rhythm; no murmurs Lungs: Respirations even and unlabored, lungs CTA bilaterally Abdomen:  Soft, nontender and nondistended. Normal bowel sounds. Extremities:  Without edema. Neurologic:  Alert and oriented,  grossly normal neurologically. Psych:  Cooperative. Normal mood and affect.  Intake/Output from previous day: 01/10 0701 - 01/11 0700 In: 3771.8 [P.O.:120; I.V.:3201.8; IV Piggyback:450] Out: 1700 [Urine:1700] Intake/Output this shift: No intake/output data recorded.  Lab Results: Recent Labs    10/17/19 0431  WBC 2.5*  HGB 10.2*  HCT 32.5*  PLT 210   BMET Recent Labs    10/15/19 0500 10/16/19 0601 10/17/19 0431  NA 144 144 145  K 3.1* 3.0* 3.4*  CL 112* 112* 114*  CO2 20* 21* 21*  GLUCOSE 132* 97 89  BUN 7 5* 5*  CREATININE 0.68 0.69 0.60  CALCIUM 8.8* 8.3* 8.6*      Assessment / Plan:    #46  59 year old African-American female with CLL on chemo with persistent complaints of dysphagia and odynophagia in the setting of recent antibiotics, steroids and chemo. She is on IV Diflucan, but has had persistent symptoms and persistent regurgitation with poor p.o. intake over the past couple of weeks.  CT of the  chest suggested mild nonspecific esophageal wall thickening  Rule out esophageal candidiasis, viral esophagitis, chemo induced esophagitis.  #2 persistent hypokalemia-on replacement, potassium 3.4 this morning #3 A. Fib-on Eliquis at home-on hold Lovenox on hold   Plan:  Patient is scheduled for EGD today.  Further recommendations pending findings at EGD Continue IV Diflucan for now Viscous lidocaine AC meals and at bedtime. Continue IV PPI daily   Active Problems:   CLL (chronic lymphocytic leukemia) ([facility])   Dysphagia   Dehydration    LOS: 4 days   Amy EsterwoodPA-C  10/17/2019, 9:09 AM      Attending Physician Note   I have taken an interval history, reviewed the chart and examined the patient. I agree with the Advanced Practitioner's note, impression and recommendations.   Dysphagia, odynophagia, poor oral intake, chest CT suggests mild diffuse esophageal wall thickened in setting of CLL on chemotherapy, recent antibiotics, recent corticosteroids.  R/O candida, viral, reflux esophagitis.   Continue Diflucan IV, magic mouthwash with lidocaine, pantoprazole IV, ondansetron IV/ODT.  EGD today.  Hold Lovenox.   Lucio Edward, MD [facility] Gastroenterology

## 2019-10-17 NOTE — Anesthesia Postprocedure Evaluation (Signed)
Anesthesia Post Note  Patient: Audrey Gross  Procedure(s) Performed: ESOPHAGOGASTRODUODENOSCOPY (EGD) WITH PROPOFOL (N/A ) BIOPSY     Patient location during evaluation: Endoscopy Anesthesia Type: MAC Level of consciousness: awake and alert Pain management: pain level controlled Vital Signs Assessment: post-procedure vital signs reviewed and stable Respiratory status: spontaneous breathing, nonlabored ventilation and respiratory function stable Cardiovascular status: stable and blood pressure returned to baseline Postop Assessment: no apparent nausea or vomiting Anesthetic complications: no    Last Vitals:  Vitals:   10/17/19 1232 10/17/19 1240  BP: (!) 147/73 (!) 150/71  Pulse: 95 82  Resp: (!) 26 (!) 31  Temp: 36.9 C   SpO2: 100% 100%    Last Pain:  Vitals:   10/17/19 1240  TempSrc:   PainSc: 0-No pain                 Catalina Gravel

## 2019-10-17 NOTE — Progress Notes (Addendum)
[facility] PROGRESS NOTE  Patient Care Team: Janith Lima, MD as PCP - General (Internal Medicine) Constance Haw, MD as PCP - Electrophysiology (Cardiology) Constance Haw, MD as Consulting Physician (Cardiology)  I have seen her, examined and agreed with documentation as follows ASSESSMENT & PLAN:  Chronic lymphocytic leukemia Overall, she tolerated chemotherapy well She had positive response to treatment The small lymphadenopathy seen in her CT scan of the chest is not significant enough to have caused her symptoms She has not been able to take chemotherapy due to inability to swallow Continue supportive care for now  Severe dysphagia She had near 0 oral intake of food and water over the week prior to admission still unable to take in food and water She could not swallow any of her pills for over a week The patient was grossly dehydrated and oliguric prior to admission despite 2 L of IV fluids.  GI has been consulted and recommends trial of Magic mouthwash with lidocaine and IV fluconazole EGD today show severe esophagitis, biopsies pending Speech therapy has been consulted and recommends dysphagia 3 diet with thin liquids Will continue aggressive IV fluid for now Calorie count has been ordered and registered dietitian is following I encouraged her to try to eat  Severe hypertension She has stopped a lot of her blood pressure medications from home I recommend her to try to resume oral medication and plan to restart her on metoprolol She is also on IV hydralazine  Severe hypokalemia This is due to poor oral intake Magnesium has now corrected Potassium remains low, but improved  continue IV fluids with potassium  Mild pancytopenia This is due to her recent treatment She is not symptomatic Observe for now  Severe constipation, resolved MiraLAX and Senokot have been ordered  Nausea and vomiting This predates her hospital admission Continue  as needed antiemetics  Severe esophagitis Biopsy pending She is on high-dose pantoprazole I will add IV Pepcid as well  Covid testing She is not symptomatic Her recent Covid testing on October 07, 2019 is negative Repeat Covid testing from 10/13/2019 is negative  DVT prophylaxis She was taking Eliquis in the past due to chronic atrial fibrillation She has not swallow Eliquis over the past week She is on DVT prophylaxis with Lovenox while she is hospitalized - currently on hold pending EGD later today Resume DVT prophylaxis  CODE STATUS Full code  Goals of care discussion Resolution of dysphagia.  She needs further work-up.  Remains dehydrated and is requiring aggressive IV fluid resuscitation.  She needs inpatient hospitalization because she has failed outpatient therapy.  The risk of not admitting her with the risk of death  Discharge planning I am hopeful we can discharge her over the next 2 to 3 days once her dysphagia resolved and she is able to adequately hydrate herself and swallow her pills    Mikey Bussing, NP 10/17/2019 9:28 AM Heath Lark, MD  INTERVAL HISTORY:  Continues to have dysphagia Still reports that liquids get "stuck" when she tries to swallow and the come right back up Unable to swallow pills Reports intermittent nausea and vomiting Still having generalized weakness Having intermittent low-grade fevers but no chills She is seen post EGD today by me  Summary of oncologic history as follows: Oncology History Overview Note  17p positive   CLL (chronic lymphocytic leukemia) ([place])  04/05/2015 Pathology Results   Accession: [id number] flow cytometry confirmed CLL. FISH was positive for p53 mutation  04/24/2015 Imaging   Extensive lymphadenopathy throughout the neck, chest (axilla), abdomen and pelvis, as detailed above, compatible with the reported clinical history of lymphoma. 2. Mild splenomegaly.    05/03/2015 - 08/27/2015 Chemotherapy   She started on  Ibrutinib, discontinued prematurely when her prescription ran out   10/10/2015 - 05/29/2017 Chemotherapy   She was restarted back on Ibrutinib   11/13/2016 PET scan   Significant generalized reduction in size of numerous lymph nodes in the neck, chest, abdomen, and pelvis. Previously the activity of these nodes was low-level and in general a similar low-level activity is present today, significantly less than mediastinal blood pool activity, compatible with Deauville 2. 2. Coronary atherosclerosis.  Mild cardiomegaly. 3. Mildly prominent endometrium for age without accentuated metabolic activity in the endometrium. Consider pelvic sonography for further characterization.   05/27/2017 PET scan   1. Progressive hypermetabolic adenopathy, primarily involving cervical, axillary, pelvic and inguinal lymph nodes bilaterally, consistent with progressive lymphoma. 2. No solid visceral organ or osseous involvement.   06/16/2017 - 08/25/2017 Chemotherapy   The patient had 3 cycles of Rituximab and Bendamustine   07/15/2017 - 07/19/2017 Hospital Admission   The patient was briefly admitted to the hospital due to infusion reaction to rituximab   09/18/2017 PET scan   1. Continued considerable adenopathy in the neck, chest, abdomen, and pelvis. This is generally stable in size but mildly reduced in activity compared to the prior exam. Current levels of activity primarily Deauville 3 and Deauville 4. No splenomegaly. 2. Diffuse new ground-glass opacities in the lungs with associated hypermetabolic activity. Some forms of lymphoma infiltration can rarely cause this pattern of diffuse ground-glass opacity and hypermetabolic activity. Differential diagnostic considerations might include atypical pneumonia such as mycoplasma, acute hypersensitivity pneumonitis, or acute eosinophilic pneumonia. Pulmonary hemorrhage seems less likely to cause this degree of accentuated metabolic activity.  3.  Aortic Atherosclerosis  (ICD10-I70.0).  Coronary atherosclerosis.   09/24/2017 -  Chemotherapy   The patient had ventoclax for chemotherapy treatment.  Rituximab is added on 11/18/17 to 04/09/18, x 6 cycles   11/19/2017 PET scan   Overall mild interval decrease in hypermetabolic lymphadenopathy throughout the neck, chest, abdomen, and pelvis. No new or increased lymphadenopathy identified.  While there has been resolution of diffuse hypermetabolic bilateral ground-glass pulmonary opacity since prior study, there is a new 14 mm hypermetabolic pulmonary nodule in the posterior right lower lobe. Time course favors inflammatory or infectious etiology over neoplasm. Recommend continued follow-up by chest CT in 3 months.   02/16/2018 PET scan   1. Adenopathy in the neck, chest, and pelvis is stable to minimally reduced in size, and is moderately reduced in activity, primarily Deauville 2 disease today. There is a right common iliac lymph node qualifying as Deauville 3 disease which is stable in size but reduced in activity. 2. Stable size but reduced activity in a pulmonary nodule in the right lower lobe. If this represents a leukemic lesion then a corresponds to Deauville 4 disease. This lesion was not readily apparent on 09/22/2017 but was reported on the prior chest CT of 11/19/2017. 3. Other imaging findings of potential clinical significance: Mild thyroid goiter. Aortic Atherosclerosis (ICD10-I70.0). Coronary atherosclerosis. Mild cardiomegaly.   04/19/2018 PET scan   1. Interval mild mixed metabolic changes, as detailed. Persistent mildly hypermetabolic bilateral axillary, mediastinal and bilateral pelvic adenopathy and mildly hypermetabolic right lower lobe pulmonary nodule compatible with lymphoproliferative disorder. Deauville 4 based on the subcarinal node. 2.  Aortic Atherosclerosis (ICD10-I70.0).  06/29/2018 Procedure   Successful placement of a right internal jugular approach power injectable Port-A-Cath. The catheter  is ready for immediate use.   07/26/2018 PET scan   1. Adenopathy primarily in the chest and pelvis as noted above, with size of the mildly enlarged lymph nodes stable to minimally increased, but with nodal activity generally decreased compared to the prior exam. Uptake in these nodes is primarily Deauville 2. 2. Aortic Atherosclerosis (ICD10-I70.0). Coronary atherosclerosis with mild cardiomegaly.   10/29/2018 PET scan   1. Relatively stable sized lymphadenopathy but interval increase in hypermetabolism when compared to the most recent prior PET-CT, as detailed above. 2. No new disease is identified. 3. Stable bilateral pulmonary nodules.   11/12/2018 - 02/02/2019 Chemotherapy   The patient had riTUXimab (RITUXAN) 800 mg in sodium chloride 0.9 % 170 mL infusion, 375 mg/m2 = 800 mg, Intravenous,  Once, 3 of 4 cycles Administration: 800 mg (11/12/2018), 800 mg (12/10/2018), 800 mg (01/06/2019)  for chemotherapy treatment.    01/11/2019 Imaging   1. Significant increased adenopathy in the chest, abdomen, and pelvis compared to the 10/28/2018 PET-CT. 2. The scattered tiny pulmonary nodules and larger single right lower lobe pulmonary nodule are all stable. 3. Other imaging findings of potential clinical significance: Aortic Atherosclerosis (ICD10-I70.0). Coronary atherosclerosis. Mild cardiomegaly. Mild diffuse thyroid prominence, stable. Mild left foraminal impingement at L4-5.   01/21/2019 Procedure   Successful ultrasound-guided core biopsies of an enlarged right axillary lymph node.     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 The latter is important because it essentially rules out  the possibility of mantle cell lymphoma. CD3 highlights a small population of T-cells. By flow cytometry, a kappa-restricted monoclonal B-cell population that expresses CD19, CD20, CD5, and CD23 comprises 89% of all lymphocytes. Overall, the features are consistent with chronic lymphocytic leukemia/small lymphocytic lymphoma.   01/31/2019 Echocardiogram    1. The left ventricle has normal systolic function, with an ejection fraction of 55-60%. The cavity size was normal. Left ventricular diastolic Doppler parameters are consistent with impaired relaxation. No evidence of left ventricular regional wall motion abnormalities. GLS -20%.  2. The right ventricle has normal systolic function. The cavity was normal. There is no increase in right ventricular wall thickness.  3. The aortic valve is tricuspid. Mild calcification of the aortic valve. No stenosis of the aortic valve.  4. The aortic root is normal in size and structure.  5. 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  6. No evidence of mitral valve stenosis. No significant mitral regurgitation.  7. Normal IVC size. No complete TR doppler jet so unable to estimate PA systolic pressure.   02/03/2019 - 07/24/2019 Chemotherapy   The patient had obinutuzumab and Acalabrutinib for chemotherapy treatment.    05/02/2019 Imaging   1. Interval decrease in size axillary and mediastinal adenopathy. 2. There are a few retroperitoneal lymph nodes which are mildly increased in size. The majority of the retroperitoneal and pelvic lymph nodes are grossly similar when compared to prior exam. 3. Stable scattered pulmonary nodules.   07/22/2019 Imaging   1. Progressive disease within the neck, chest, abdomen, and pelvis, as evidenced by increase in adenopathy. 2. Similar bilateral pulmonary nodules. 3. Extensive right-sided colonic pneumatosis and  small volume extracolonic intraperitoneal gas. Correlate with abdominal symptoms. Of note, pneumatosis  has been described in patients on immunotherapy. 4. Age advanced coronary artery atherosclerosis. Recommend assessment of coronary risk factors and consideration of medical therapy. 5.  Aortic Atherosclerosis (ICD10-I70.0). 6. Similar mild L1 compression deformity. 7. Trace free pelvic fluid, similar. 8. Pulmonary artery enlargement suggests pulmonary arterial hypertension.   08/11/2019 -  Chemotherapy   The patient had duvelisib for chemotherapy treatment.     10/12/2019 Imaging   1. Slight enlargement of a high right paratracheal lymph node. Otherwise, lymph nodes in the axillary regions, abdomen and pelvis have decreased in size in the interval, consistent with treatment response. 2. Interval decrease in size and number of pulmonary nodules, indicative of metastatic disease. 3. There may be mild nonspecific diffuse esophageal wall thickening. 4. Aortic atherosclerosis (ICD10-I70.0). Coronary artery calcification. 5. Enlarged pulmonic trunk, indicative of arterial hypertension.   10/13/2019 -  Hospital Admission   She was admitted for severe dysphagia, dehydration and electrolytes abnormalities     MEDICAL HISTORY:  Past Medical History:  Diagnosis Date   Atrial fibrillation ([place])    Cystic breast    Depression    Diffuse cystic mastopathy    Dyslipidemia (high LDL; low HDL)    Hypertension    Iron deficiency anemia, unspecified    Lymphadenopathy of head and neck 04/05/2015   Medical non-compliance    Mitral regurgitation 02/2017   moderate to severe   Persistent atrial fibrillation with rapid ventricular response ([place]) 07/15/2017    SURGICAL HISTORY: Past Surgical History:  Procedure Laterality Date   CARDIOVERSION N/A 04/02/2017   Procedure: CARDIOVERSION;  Surgeon: Dorothy Spark, MD;  Location: [facility];  Service: Cardiovascular;  Laterality: N/A;   CARDIOVERSION N/A 08/19/2017   Procedure: CARDIOVERSION;  Surgeon: Larey Dresser, MD;  Location: [facility] ENDOSCOPY;   Service: Cardiovascular;  Laterality: N/A;   IR IMAGING GUIDED PORT INSERTION  06/29/2018   TUBAL LIGATION      SOCIAL HISTORY: Social History   Socioeconomic History   Marital status: Married    Spouse name: Not on file   Number of children: 6   Years of education: Not on file   Highest education level: Not on file  Occupational History   Occupation: Heritage manager: [employer]  Tobacco Use   Smoking status: Never Smoker   Smokeless tobacco: Never Used  Substance and Sexual Activity   Alcohol use: No   Drug use: No   Sexual activity: Not Currently    Partners: Male    Birth control/protection: Surgical    Comment: tubal ligation  Other Topics Concern   Not on file  Social History Narrative   Not on file   Social Determinants of Health   Financial Resource Strain:    Difficulty of Paying Living Expenses: Not on file  Food Insecurity:    Worried About [place] in the Last Year: Not on file   [employer] of Food in the Last Year: Not on file  Transportation Needs:    Lack of Transportation (Medical): Not on file   Lack of Transportation (Non-Medical): Not on file  Physical Activity:    Days of Exercise per Week: Not on file   Minutes of Exercise per Session: Not on file  Stress:    Feeling of Stress : Not on file  Social Connections:    Frequency of Communication with Friends and Family: Not on file   Frequency of  Social Gatherings with Friends and Family: Not on file   Attends Religious Services: Not on file   Active Member of Clubs or Organizations: Not on file   Attends Archivist Meetings: Not on file   Marital Status: Not on file  Intimate Partner Violence:    Fear of Current or Ex-Partner: Not on file   Emotionally Abused: Not on file   Physically Abused: Not on file   Sexually Abused: Not on file    FAMILY HISTORY: Family History  Problem Relation Age of Onset   Sudden death Mother    Kidney disease Father        H/O HD and kidney  transplant   Stomach cancer Maternal Aunt    Stomach cancer Paternal Grandmother    Heart disease Brother    Heart disease Maternal Grandmother     ALLERGIES:  has No Known Allergies.  MEDICATIONS:  Current Facility-Administered Medications  Medication Dose Route Frequency Provider Last Rate Last Admin   0.9 % NaCl with KCl 40 mEq / L  infusion   Intravenous Continuous Alvy Bimler, Rudy Domek, MD 100 mL/hr at 10/17/19 0425 100 mL/hr at 10/17/19 0425   acetaminophen (TYLENOL) tablet 650 mg  650 mg Oral Q4H PRN Syd Newsome, MD       chlorhexidine (PERIDEX) 0.12 % solution 15 mL  15 mL Mouth Rinse BID Alvy Bimler, Kamila Broda, MD   15 mL at 10/17/19 0847   Chlorhexidine Gluconate Cloth 2 % PADS 6 each  6 each Topical Daily Heath Lark, MD   6 each at 10/17/19 0904   feeding supplement (BOOST / RESOURCE BREEZE) liquid 1 Container  1 Container Oral TID BM Winthrop Shannahan, MD   1 Container at 10/15/19 1008   feeding supplement (PRO-STAT SUGAR FREE 64) liquid 30 mL  30 mL Oral BID Alvy Bimler, Oren Barella, MD   30 mL at 10/16/19 1239   fluconazole (DIFLUCAN) IVPB 100 mg  100 mg Intravenous Q24H Willia Craze, NP 50 mL/hr at 10/17/19 0904 100 mg at 10/17/19 0904   hydrALAZINE (APRESOLINE) injection 10 mg  10 mg Intravenous Q6H Quientin Jent, MD   10 mg at 10/17/19 0849   lidocaine-prilocaine (EMLA) cream 1 application  1 application Topical PRN Alvy Bimler, Devaun Hernandez, MD       magic mouthwash w/lidocaine  10 mL Oral TID Willia Craze, NP   10 mL at 10/17/19 0848   MEDLINE mouth rinse  15 mL Mouth Rinse q12n4p Danika Kluender, MD   15 mL at 10/15/19 1300   multivitamin with minerals tablet 1 tablet  1 tablet Oral Daily Heath Lark, MD   Stopped at 10/17/19 0905   ondansetron (ZOFRAN) tablet 4-8 mg  4-8 mg Oral Q8H PRN Heath Lark, MD       Or   ondansetron (ZOFRAN-ODT) disintegrating tablet 4-8 mg  4-8 mg Oral Q8H PRN Alvy Bimler, Sueellen Kayes, MD   4 mg at 10/14/19 2003   Or   ondansetron (ZOFRAN) injection 4 mg  4 mg Intravenous Q8H PRN Alvy Bimler, Hanaa Payes, MD   4 mg  at 10/15/19 1351   Or   ondansetron (ZOFRAN) 8 mg in sodium chloride 0.9 % 50 mL IVPB  8 mg Intravenous Q8H PRN Avalina Benko, MD       pantoprazole (PROTONIX) injection 40 mg  40 mg Intravenous Q24H Willia Craze, NP   40 mg at 10/16/19 1237   phenol (CHLORASEPTIC) mouth spray 1 spray  1 spray Mouth/Throat PRN Ladell Pier, MD  polyethylene glycol (MIRALAX / GLYCOLAX) packet 17 g  17 g Oral Daily Heath Lark, MD   Stopped at 10/17/19 0905   promethazine (PHENERGAN) injection 12.5 mg  12.5 mg Intravenous Q6H PRN Ladell Pier, MD   12.5 mg at 10/16/19 0839   senna-docusate (Senokot-S) tablet 1 tablet  1 tablet Oral QHS PRN Heath Lark, MD        REVIEW OF SYSTEMS:   Constitutional: Having low-grade fevers.  No chills or abnormal night sweats Eyes: Denies blurriness of vision, double vision or watery eyes Respiratory: Denies cough, dyspnea or wheezes Cardiovascular: Denies palpitation, chest discomfort or lower extremity swelling Skin: Denies abnormal skin rashes Lymphatics: Denies new lymphadenopathy or easy bruising Behavioral/Psych: Mood is stable, no new changes  All other systems were reviewed with the patient and are negative.  PHYSICAL EXAMINATION: ECOG PERFORMANCE STATUS: 1 - Symptomatic but completely ambulatory BP (!) 142/84   Pulse 86   Temp 98.3 F (36.8 C) (Oral)   Resp 17   Ht '5\' 7"'  (1.702 m)   Wt 185 lb 6.5 oz (84.1 kg)   LMP 02/03/2017 (Approximate)   SpO2 97%   BMI 29.04 kg/m    Intake/Output Summary (Last 24 hours) at 10/17/2019 8315 Last data filed at 10/17/2019 1761 Gross per 24 hour  Intake 3651.84 ml  Output 900 ml  Net 2751.84 ml   GENERAL:alert, no distress and comfortable SKIN: skin color, texture, turgor are normal, no rashes or significant lesions EYES: normal, conjunctiva are pink and non-injected, sclera clear OROPHARYNX: White coating on tongue, excoriation in her posterior pharynx NECK: supple, thyroid normal size, non-tender,  without nodularity LYMPH:  no palpable lymphadenopathy in the cervical, axillary or inguinal LUNGS: clear to auscultation and percussion with normal breathing effort HEART: regular rate & rhythm and no murmurs and no lower extremity edema ABDOMEN:abdomen soft, non-tender and normal bowel sounds Musculoskeletal:no cyanosis of digits and no clubbing  PSYCH: alert & oriented x 3 with fluent speech NEURO: no focal motor/sensory deficits  LABORATORY DATA:  I have reviewed the data as listed Lab Results  Component Value Date   WBC 2.5 (L) 10/17/2019   HGB 10.2 (L) 10/17/2019   HCT 32.5 (L) 10/17/2019   MCV 90.8 10/17/2019   PLT 210 10/17/2019   Recent Labs    09/06/19 1147 10/03/19 1238 10/13/19 0843 10/15/19 0500 10/16/19 0601 10/17/19 0431  NA 141 144 145 144 144 145  K 4.2 4.1 2.9* 3.1* 3.0* 3.4*  CL 104 108 107 112* 112* 114*  CO2 27 25 18* 20* 21* 21*  GLUCOSE 107* 117* 97 132* 97 89  BUN '11 9 12 7 ' 5* 5*  CREATININE 0.82 0.81 0.86 0.68 0.69 0.60  CALCIUM 9.3 9.3 9.0 8.8* 8.3* 8.6*  GFRNONAA >60 >60 >60 >60 >60 >60  GFRAA >60 >60 >60 >60 >60 >60  PROT 6.4* 6.8 6.9  --   --   --   ALBUMIN 3.9 3.8 3.9  --   --   --   AST 14* 13* 20  --   --   --   ALT '15 9 9  ' --   --   --   ALKPHOS 61 59 58  --   --   --   BILITOT 0.8 0.8 0.9  --   --   --    EGD report is reviewed RADIOGRAPHIC STUDIES: I have personally reviewed the radiological images as listed and agreed with the findings in the report. CT Chest  W Contrast  Result Date: 10/12/2019 CLINICAL DATA:  CLL. New dysphagia. Ongoing chemotherapy pill. Cough with vomiting. EXAM: CT CHEST, ABDOMEN, AND PELVIS WITH CONTRAST TECHNIQUE: Multidetector CT imaging of the chest, abdomen and pelvis was performed following the standard protocol during bolus administration of intravenous contrast. CONTRAST:  130m OMNIPAQUE IOHEXOL 300 MG/ML  SOLN COMPARISON:  07/22/2019. FINDINGS: CT CHEST FINDINGS Cardiovascular: Right IJ Port-A-Cath  terminates in the high right atrium. Atherosclerotic calcification of the aorta and coronary arteries. Pulmonic trunk is enlarged. Heart is at the upper limits of normal in size to mildly enlarged. No pericardial effusion. Mediastinum/Nodes: High right paratracheal lymph node is enlarged slightly now measuring 7 mm (2/12) previously 4 mm. No additional pathologically enlarged mediastinal or hilar lymph nodes. Subpectoral and axillary lymph nodes have decreased in size in the interval. Index left axillary lymph node measures 11 mm (2/10), compared to 1.9 cm on 07/22/2019. There may be mild diffuse esophageal wall thickening. Lungs/Pleura: Minimal biapical pleuroparenchymal scarring. Decrease in size and number of bilateral pulmonary nodules. For example, a 3 mm nodule seen in the anterior segment right upper lobe on 07/22/2019 is no longer visualized. 12 mm medial right lower lobe nodule (4/79), decreased from 15 mm. No pleural fluid. Airway is unremarkable. Musculoskeletal: No worrisome lytic or sclerotic lesions. CT ABDOMEN PELVIS FINDINGS Hepatobiliary: Liver and gallbladder are unremarkable. No biliary ductal dilatation. Pancreas: Negative. Spleen: Negative. Adrenals/Urinary Tract: Adrenal glands are unremarkable. Low-attenuation lesions in the right kidney measure up to 1.5 cm and are likely cysts. Kidneys are otherwise unremarkable. Ureters are decompressed. Bladder is low in volume. Stomach/Bowel: Stomach, small bowel, appendix and colon are unremarkable. Vascular/Lymphatic: Atherosclerotic calcification of the aorta without aneurysm. Retroperitoneal lymph nodes have decreased in size in the interval. Index left periaortic lymph node measures 6 mm (2/76), previously 10 mm. Abdominal peritoneal ligament lymph nodes are not enlarged by CT size criteria, similar to the prior exam. Mesenteric lymph nodes have decreased in size. Index lymph node near the root of the small bowel mesentery measures 4 mm (2/66),  previously 7 mm. Pelvic retroperitoneal lymph nodes have decreased in size in the interval as well. Index left external iliac lymph node measures 10 mm (2/104), previously 1.6 cm. Inguinal lymph nodes are not enlarged by CT size criteria. Reproductive: Uterus is visualized.  No adnexal mass. Other: Mild laxity of the ventral abdominal wall at the umbilicus. No free fluid. Mesenteries and peritoneum are otherwise unremarkable. Musculoskeletal: No worrisome lytic or sclerotic lesions. Mild L1 compression deformity, unchanged. IMPRESSION: 1. Slight enlargement of a high right paratracheal lymph node. Otherwise, lymph nodes in the axillary regions, abdomen and pelvis have decreased in size in the interval, consistent with treatment response. 2. Interval decrease in size and number of pulmonary nodules, indicative of metastatic disease. 3. There may be mild nonspecific diffuse esophageal wall thickening. 4. Aortic atherosclerosis (ICD10-I70.0). Coronary artery calcification. 5. Enlarged pulmonic trunk, indicative of arterial hypertension. Electronically Signed   By: MLorin PicketM.D.   On: 10/12/2019 13:05   CT Abdomen Pelvis W Contrast  Result Date: 10/12/2019 CLINICAL DATA:  CLL. New dysphagia. Ongoing chemotherapy pill. Cough with vomiting. EXAM: CT CHEST, ABDOMEN, AND PELVIS WITH CONTRAST TECHNIQUE: Multidetector CT imaging of the chest, abdomen and pelvis was performed following the standard protocol during bolus administration of intravenous contrast. CONTRAST:  1044mOMNIPAQUE IOHEXOL 300 MG/ML  SOLN COMPARISON:  07/22/2019. FINDINGS: CT CHEST FINDINGS Cardiovascular: Right IJ Port-A-Cath terminates in the high right atrium. Atherosclerotic calcification of the  aorta and coronary arteries. Pulmonic trunk is enlarged. Heart is at the upper limits of normal in size to mildly enlarged. No pericardial effusion. Mediastinum/Nodes: High right paratracheal lymph node is enlarged slightly now measuring 7 mm (2/12)  previously 4 mm. No additional pathologically enlarged mediastinal or hilar lymph nodes. Subpectoral and axillary lymph nodes have decreased in size in the interval. Index left axillary lymph node measures 11 mm (2/10), compared to 1.9 cm on 07/22/2019. There may be mild diffuse esophageal wall thickening. Lungs/Pleura: Minimal biapical pleuroparenchymal scarring. Decrease in size and number of bilateral pulmonary nodules. For example, a 3 mm nodule seen in the anterior segment right upper lobe on 07/22/2019 is no longer visualized. 12 mm medial right lower lobe nodule (4/79), decreased from 15 mm. No pleural fluid. Airway is unremarkable. Musculoskeletal: No worrisome lytic or sclerotic lesions. CT ABDOMEN PELVIS FINDINGS Hepatobiliary: Liver and gallbladder are unremarkable. No biliary ductal dilatation. Pancreas: Negative. Spleen: Negative. Adrenals/Urinary Tract: Adrenal glands are unremarkable. Low-attenuation lesions in the right kidney measure up to 1.5 cm and are likely cysts. Kidneys are otherwise unremarkable. Ureters are decompressed. Bladder is low in volume. Stomach/Bowel: Stomach, small bowel, appendix and colon are unremarkable. Vascular/Lymphatic: Atherosclerotic calcification of the aorta without aneurysm. Retroperitoneal lymph nodes have decreased in size in the interval. Index left periaortic lymph node measures 6 mm (2/76), previously 10 mm. Abdominal peritoneal ligament lymph nodes are not enlarged by CT size criteria, similar to the prior exam. Mesenteric lymph nodes have decreased in size. Index lymph node near the root of the small bowel mesentery measures 4 mm (2/66), previously 7 mm. Pelvic retroperitoneal lymph nodes have decreased in size in the interval as well. Index left external iliac lymph node measures 10 mm (2/104), previously 1.6 cm. Inguinal lymph nodes are not enlarged by CT size criteria. Reproductive: Uterus is visualized.  No adnexal mass. Other: Mild laxity of the ventral  abdominal wall at the umbilicus. No free fluid. Mesenteries and peritoneum are otherwise unremarkable. Musculoskeletal: No worrisome lytic or sclerotic lesions. Mild L1 compression deformity, unchanged. IMPRESSION: 1. Slight enlargement of a high right paratracheal lymph node. Otherwise, lymph nodes in the axillary regions, abdomen and pelvis have decreased in size in the interval, consistent with treatment response. 2. Interval decrease in size and number of pulmonary nodules, indicative of metastatic disease. 3. There may be mild nonspecific diffuse esophageal wall thickening. 4. Aortic atherosclerosis (ICD10-I70.0). Coronary artery calcification. 5. Enlarged pulmonic trunk, indicative of arterial hypertension. Electronically Signed   By: Lorin Picket M.D.   On: 10/12/2019 13:05   DG Swallowing Func-Speech Pathology  Result Date: 10/14/2019 Objective Swallowing Evaluation: Type of Study: MBS-Modified Barium Swallow Study  Patient Details Name: YURIDIANA FORMANEK MRN: [id number] Date of Birth: Mar 02, 1961 Today's Date: 10/14/2019 Time: SLP Start Time (ACUTE ONLY): 68 -SLP Stop Time (ACUTE ONLY): 5400 SLP Time Calculation (min) (ACUTE ONLY): 17 min Past Medical History: Past Medical History: Diagnosis Date  Atrial fibrillation ([place])   Cystic breast   Depression   Diffuse cystic mastopathy   Dyslipidemia (high LDL; low HDL)   Hypertension   Iron deficiency anemia, unspecified   Lymphadenopathy of head and neck 04/05/2015  Medical non-compliance   Mitral regurgitation 02/2017  moderate to severe  Persistent atrial fibrillation with rapid ventricular response ([place]) 07/15/2017 Past Surgical History: Past Surgical History: Procedure Laterality Date  CARDIOVERSION N/A 04/02/2017  Procedure: CARDIOVERSION;  Surgeon: Dorothy Spark, MD;  Location: [place];  Service: Cardiovascular;  Laterality: N/A;  CARDIOVERSION  N/A 08/19/2017  Procedure: CARDIOVERSION;  Surgeon: Larey Dresser, MD;  Location[facility] ENDOSCOPY;   Service: Cardiovascular;  Laterality: N/A;  IR IMAGING GUIDED PORT INSERTION  06/29/2018  TUBAL LIGATION   HPI: SAMUEL RITTENHOUSE is a 59 y.o. female with chronic lymphocytic leukemia. Per chart saw Dr. Ardis Hughs for dysphagia in 2018 for evaluation of dysphagia. Per chart, chemotherapy has been on hold due to her inability to swallow, odonophagia and seen  week ago for  evaluation of nausea, vomiting. Additionally any thing she eats gets stuck and she subsequently vomits it up. This is not like the dysphagia she had in 2018, it is much worse. Chest CT suggests mild non-specific esophageal wall thickening. Suspect oral / esophageal candida but viral etiologies such as HSV, CMV cannot be excluded. Some mild improvement with Diflucan + Magic mouthwash  No data recorded Assessment / Plan / Recommendation CHL IP CLINICAL IMPRESSIONS 10/14/2019 Clinical Impression Pt exhibits significant labial and facial weakness and reduced ROM. She affirmed she was told she had Bell's palsy however this therapist unable to find confirmation through documentation. Despite impairments and oral candidias she was able to functionally masticate, manipulate and transit puree, thin as well as regular texture boluses and denied odonophagia during study. Thin barium was penetrated on two trials however completely exited laryngeal vestibule during the swallow (one was close to cords). Chin tuck prevented but pt stated it hurt her neck and did not wish to perform. Mild residue mid esophagus(MBS does not diagnose below level of UES). Discussed options for diet textures and she stated she would not eat puree and "could order soft food." Recommend Dys 3, thin, crushed pills and allow pt to choose foods. ST will continue to see for tolerance of recommendations.   SLP Visit Diagnosis Dysphagia, pharyngeal phase (R13.13) Attention and concentration deficit following -- Frontal lobe and executive function deficit following -- Impact on safety and function Mild  aspiration risk   CHL IP TREATMENT RECOMMENDATION 10/14/2019 Treatment Recommendations Therapy as outlined in treatment plan below   Prognosis 10/14/2019 Prognosis for Safe Diet Advancement Good Barriers to Reach Goals -- Barriers/Prognosis Comment -- CHL IP DIET RECOMMENDATION 10/14/2019 SLP Diet Recommendations Dysphagia 3 (Mech soft) solids;Thin liquid Liquid Administration via Cup;Straw Medication Administration Crushed with puree Compensations Slow rate;Small sips/bites Postural Changes Seated upright at 90 degrees   CHL IP OTHER RECOMMENDATIONS 10/14/2019 Recommended Consults -- Oral Care Recommendations Oral care BID Other Recommendations --   CHL IP FOLLOW UP RECOMMENDATIONS 10/14/2019 Follow up Recommendations Other (comment)   CHL IP FREQUENCY AND DURATION 10/14/2019 Speech Therapy Frequency (ACUTE ONLY) min 2x/week Treatment Duration 2 weeks      CHL IP ORAL PHASE 10/14/2019 Oral Phase WFL Oral - Pudding Teaspoon -- Oral - Pudding Cup -- Oral - Honey Teaspoon -- Oral - Honey Cup -- Oral - Nectar Teaspoon -- Oral - Nectar Cup -- Oral - Nectar Straw -- Oral - Thin Teaspoon -- Oral - Thin Cup -- Oral - Thin Straw -- Oral - Puree -- Oral - Mech Soft -- Oral - Regular -- Oral - Multi-Consistency -- Oral - Pill -- Oral Phase - Comment --  CHL IP PHARYNGEAL PHASE 10/14/2019 Pharyngeal Phase Impaired Pharyngeal- Pudding Teaspoon -- Pharyngeal -- Pharyngeal- Pudding Cup -- Pharyngeal -- Pharyngeal- Honey Teaspoon -- Pharyngeal -- Pharyngeal- Honey Cup -- Pharyngeal -- Pharyngeal- Nectar Teaspoon -- Pharyngeal -- Pharyngeal- Nectar Cup WFL;Pharyngeal residue - valleculae Pharyngeal -- Pharyngeal- Nectar Straw -- Pharyngeal -- Pharyngeal- Thin Teaspoon -- Pharyngeal --  Pharyngeal- Thin Cup Penetration/Aspiration during swallow Pharyngeal Material enters airway, CONTACTS cords and then ejected out Pharyngeal- Thin Straw WFL Pharyngeal -- Pharyngeal- Puree -- Pharyngeal -- Pharyngeal- Mechanical Soft WFL Pharyngeal -- Pharyngeal-  Regular -- Pharyngeal -- Pharyngeal- Multi-consistency -- Pharyngeal -- Pharyngeal- Pill -- Pharyngeal -- Pharyngeal Comment --  CHL IP CERVICAL ESOPHAGEAL PHASE 10/14/2019 Cervical Esophageal Phase WFL Pudding Teaspoon -- Pudding Cup -- Honey Teaspoon -- Honey Cup -- Nectar Teaspoon -- Nectar Cup -- Nectar Straw -- Thin Teaspoon -- Thin Cup -- Thin Straw -- Puree -- Mechanical Soft -- Regular -- Multi-consistency -- Pill -- Cervical Esophageal Comment -- Houston Siren 10/14/2019, 5:03 PM  Orbie Pyo Litaker M.Ed Risk analyst [phone] Office [phone]

## 2019-10-17 NOTE — H&P (View-Only) (Signed)
Patient ID: Audrey Gross, female   DOB: 06-Nov-1960, 59 y.o.   MRN: [id number]    Progress Note   Subjective  Day # 4 CC: Dysphagia/ odynophagia  Patient says she feels about the same, persistent dysphagia and odynophagia.  Feels that food able to traverse to her stomach but "comes right back up".  IV Diflucan  WBC 2.5, hemoglobin 10.2 Potassium 3.4   Objective   Vital signs in last 24 hours: Temp:  [98.3 F (36.8 C)-100 F (37.8 C)] 98.3 F (36.8 C) (01/11 [id number]) Pulse Rate:  [86-109] 86 (01/11 0845) Resp:  [12-17] 17 (01/11 0608) BP: (140-162)/(84-90) 142/84 (01/11 0845) SpO2:  [97 %-99 %] 97 % (01/11 [id number]) Weight:  [84.1 kg] 84.1 kg (01/11 0608) Last BM Date: 10/15/19 General:    African-American female in NAD Heart:  Regular rate and rhythm; no murmurs Lungs: Respirations even and unlabored, lungs CTA bilaterally Abdomen:  Soft, nontender and nondistended. Normal bowel sounds. Extremities:  Without edema. Neurologic:  Alert and oriented,  grossly normal neurologically. Psych:  Cooperative. Normal mood and affect.  Intake/Output from previous day: 01/10 0701 - 01/11 0700 In: 3771.8 [P.O.:120; I.V.:3201.8; IV Piggyback:450] Out: 1700 [Urine:1700] Intake/Output this shift: No intake/output data recorded.  Lab Results: Recent Labs    10/17/19 0431  WBC 2.5*  HGB 10.2*  HCT 32.5*  PLT 210   BMET Recent Labs    10/15/19 0500 10/16/19 0601 10/17/19 0431  NA 144 144 145  K 3.1* 3.0* 3.4*  CL 112* 112* 114*  CO2 20* 21* 21*  GLUCOSE 132* 97 89  BUN 7 5* 5*  CREATININE 0.68 0.69 0.60  CALCIUM 8.8* 8.3* 8.6*      Assessment / Plan:    #69  59 year old African-American female with CLL on chemo with persistent complaints of dysphagia and odynophagia in the setting of recent antibiotics, steroids and chemo. She is on IV Diflucan, but has had persistent symptoms and persistent regurgitation with poor p.o. intake over the past couple of weeks.  CT of the  chest suggested mild nonspecific esophageal wall thickening  Rule out esophageal candidiasis, viral esophagitis, chemo induced esophagitis.  #2 persistent hypokalemia-on replacement, potassium 3.4 this morning #3 A. Fib-on Eliquis at home-on hold Lovenox on hold   Plan:  Patient is scheduled for EGD today.  Further recommendations pending findings at EGD Continue IV Diflucan for now Viscous lidocaine AC meals and at bedtime. Continue IV PPI daily   Active Problems:   CLL (chronic lymphocytic leukemia) (HCC)   Dysphagia   Dehydration    LOS: 4 days   Amy EsterwoodPA-C  10/17/2019, 9:09 AM      Attending Physician Note   I have taken an interval history, reviewed the chart and examined the patient. I agree with the Advanced Practitioner's note, impression and recommendations.   Dysphagia, odynophagia, poor oral intake, chest CT suggests mild diffuse esophageal wall thickened in setting of CLL on chemotherapy, recent antibiotics, recent corticosteroids.  R/O candida, viral, reflux esophagitis.   Continue Diflucan IV, magic mouthwash with lidocaine, pantoprazole IV, ondansetron IV/ODT.  EGD today.  Hold Lovenox.   Lucio Edward, MD [facility]

## 2019-10-17 NOTE — Progress Notes (Signed)
Calorie Count Note  48 hour calorie count ordered.  Diet: Dysphagia 3, thin liquids Supplements: Boost Breeze TID (accepting ~25% of the time); 30 ml prostat BID (accepting ~90% of the time)  Breakfast (1/8): 50%--97 kcal, 1 gram protein Dinner (1/8): 50%--115 kcal, 0 grams protein Breakfast (1/9): 75%--518 kcal, 15 grams protein Lunch (1/9): 75%--381 kcal, 17 grams protein Dinner (1/9): 0%  Estimated Nutritional Needs:  Kcal:  2130-2380 kcal Protein:  105-120 grams Fluid:  >/= 2.1 L/day   Nutrition Dx:  Increased nutrient needs related to acute illness, cancer and cancer related treatments as evidenced by estimated needs.  Goal:  Patient will meet greater than or equal to 90% of their needs  Intervention:  - continue 30 ml prostat BID. - will d/c Boost Breeze - will order Ensure Enlive po BID, each supplement provides 350 kcal and 20 grams of protein - continue to encourage PO intakes - will complete day #2 results from Calorie Count on 1/12     Jarome Matin, MS, RD, LDN, [facility] Inpatient Clinical Dietitian Pager # [phone] After hours/weekend pager # [phone]

## 2019-10-17 NOTE — Transfer of Care (Signed)
Immediate Anesthesia Transfer of Care Note  Patient: Audrey Gross  Procedure(s) Performed: ESOPHAGOGASTRODUODENOSCOPY (EGD) WITH PROPOFOL (N/A ) BIOPSY  Patient Location: PACU and Endoscopy Unit  Anesthesia Type:MAC  Level of Consciousness: awake, alert  and patient cooperative  Airway & Oxygen Therapy: Patient Spontanous Breathing and Patient connected to face mask oxygen  Post-op Assessment: Report given to RN and Post -op Vital signs reviewed and stable  Post vital signs: Reviewed and stable  Last Vitals:  Vitals Value Taken Time  BP    Temp    Pulse 95 10/17/19 1232  Resp 26 10/17/19 1232  SpO2 100 % 10/17/19 1232  Vitals shown include unvalidated device data.  Last Pain:  Vitals:   10/17/19 1036  TempSrc: Oral  PainSc: 0-No pain         Complications: No apparent anesthesia complications

## 2019-10-17 NOTE — Anesthesia Procedure Notes (Signed)
Procedure Name: MAC Date/Time: 10/17/2019 12:04 PM Performed by: West Pugh, CRNA Pre-anesthesia Checklist: Patient identified, Emergency Drugs available, Suction available, Patient being monitored and Timeout performed Patient Re-evaluated:Patient Re-evaluated prior to induction Oxygen Delivery Method: Simple face mask Preoxygenation: Pre-oxygenation with 100% oxygen Induction Type: IV induction Placement Confirmation: positive ETCO2 Dental Injury: Teeth and Oropharynx as per pre-operative assessment

## 2019-10-17 NOTE — Op Note (Signed)
[facility] Patient Name: Audrey Gross Procedure Date: 10/17/2019 MRN: [id number] Attending MD: Jerene Bears , MD Date of Birth: [birth year] CSN: [id number] Age: 59 Admit Type: Inpatient Procedure:                Upper GI endoscopy Indications:              Dysphagia, Odynophagia in setting of chemotherapy                            for CLL Providers:                Lajuan Lines. Hilarie Fredrickson, MD, Cleda Daub, RN, Janeece Agee,                            Technician Referring MD:             [facility] Medicines:                Monitored Anesthesia Care Complications:            No immediate complications. Estimated Blood Loss:     Estimated blood loss was minimal. Procedure:                Pre-Anesthesia Assessment:                           - Prior to the procedure, a History and Physical                            was performed, and patient medications and                            allergies were reviewed. The patient's tolerance of                            previous anesthesia was also reviewed. The risks                            and benefits of the procedure and the sedation                            options and risks were discussed with the patient.                            All questions were answered, and informed consent                            was obtained. Prior Anticoagulants: The patient has                            taken Eliquis (apixaban), last dose was 3 days                            prior to procedure. ASA Grade Assessment: III - A  patient with severe systemic disease. After                            reviewing the risks and benefits, the patient was                            deemed in satisfactory condition to undergo the                            procedure.                           After obtaining informed consent, the endoscope was                            passed under direct vision. Throughout the                             procedure, the patient's blood pressure, pulse, and                            oxygen saturations were monitored continuously. The                            [id number] Olympus gastroscope was                            introduced through the mouth, and advanced to the                            second part of duodenum. The upper GI endoscopy was                            accomplished without difficulty. The patient                            tolerated the procedure well. Scope In: Scope Out: Findings:      LA Grade D (one or more mucosal breaks involving at least 75% of       esophageal circumference) esophagitis with no bleeding was found in the       entire esophagus. Esophagitis is ulcerative and most pronounced in the       proximal and mid esophagus and scattered in the lower esophagus.       Biopsies were taken with a cold forceps for histology to exclude viral       infection (CMV and HSV).      Moderate inflammation characterized by congestion (edema) and erythema       was found in the cardia, in the gastric body and in the gastric antrum.       Biopsies were taken with a cold forceps for histology and Helicobacter       pylori testing.      A single 10-12 mm polypoid nodule was found in the duodenal bulb near at       duodenum sweep. Biopsies were taken with a cold forceps for histology.  The exam of the duodenum was otherwise normal. Impression:               - LA Grade D acute and erosive/ulcerative                            esophagitis with no bleeding. Biopsied to evaluate                            for viral esophagitis.                           - Gastritis. Biopsied.                           - Polypoid nodule found in the duodenum. Biopsied. Moderate Sedation:      N/A Recommendation:           - Return patient to hospital ward for ongoing care.                           - Resume previous diet.                           - Continue  present medications.                           - Would continue fluconazole for now. Would change                            PPI to BID just to eliminate and acid reflux which                            may occur (given nausea/vomiting).                           - Await pathology results. Procedure Code(s):        --- Professional ---                           [phone], Esophagogastroduodenoscopy, flexible,                            transoral; with biopsy, single or multiple Diagnosis Code(s):        --- Professional ---                           K20.80, Other esophagitis without bleeding                           K29.70, Gastritis, unspecified, without bleeding                           K31.89, Other diseases of stomach and duodenum                           R13.10, Dysphagia, unspecified CPT copyright 2019 [employer]. All rights reserved. The codes documented  in this report are preliminary and upon coder review may  be revised to meet current compliance requirements. Jerene Bears, MD 10/17/2019 12:37:54 PM This report has been signed electronically. Number of Addenda: 0

## 2019-10-18 ENCOUNTER — Other Ambulatory Visit: Payer: Self-pay

## 2019-10-18 DIAGNOSIS — K209 Esophagitis, unspecified without bleeding: Secondary | ICD-10-CM

## 2019-10-18 LAB — BASIC METABOLIC PANEL
Anion gap: 12 (ref 5–15)
BUN: 6 mg/dL (ref 6–20)
CO2: 21 mmol/L — ABNORMAL LOW (ref 22–32)
Calcium: 8.7 mg/dL — ABNORMAL LOW (ref 8.9–10.3)
Chloride: 109 mmol/L (ref 98–111)
Creatinine, Ser: 0.6 mg/dL (ref 0.44–1.00)
GFR calc Af Amer: >60 mL/min (ref >60)
GFR calc non Af Amer: >60 mL/min (ref >60)
Glucose, Bld: 87 mg/dL (ref 70–99)
Potassium: 3.5 mmol/L (ref 3.5–5.1)
Sodium: 142 mmol/L (ref 135–145)

## 2019-10-18 MED ORDER — SUCRALFATE 1 GM/10ML PO SUSP
1.0000 g | Freq: Three times a day (TID) | ORAL | Status: DC
  Administered 2019-10-18 – 2019-10-19 (×5): 1 g via ORAL
  Filled 2019-10-18 (×5): qty 10

## 2019-10-18 MED ORDER — LISINOPRIL 10 MG PO TABS
10.0000 mg | ORAL_TABLET | Freq: Every day | ORAL | Status: DC
  Administered 2019-10-18 – 2019-10-19 (×2): 10 mg via ORAL
  Filled 2019-10-18 (×2): qty 1

## 2019-10-18 MED ORDER — ONDANSETRON HCL 4 MG/2ML IJ SOLN
4.0000 mg | Freq: Three times a day (TID) | INTRAMUSCULAR | Status: DC
  Administered 2019-10-19: 15:00:00 4 mg via INTRAVENOUS
  Filled 2019-10-18: qty 2

## 2019-10-18 MED ORDER — SODIUM CHLORIDE 0.9 % IV SOLN
8.0000 mg | Freq: Three times a day (TID) | INTRAVENOUS | Status: DC
  Filled 2019-10-18 (×5): qty 4

## 2019-10-18 MED ORDER — ENOXAPARIN SODIUM 40 MG/0.4ML ~~LOC~~ SOLN
40.0000 mg | SUBCUTANEOUS | Status: DC
  Administered 2019-10-18 – 2019-10-19 (×2): 40 mg via SUBCUTANEOUS
  Filled 2019-10-18 (×2): qty 0.4

## 2019-10-18 MED ORDER — ONDANSETRON HCL 4 MG PO TABS
4.0000 mg | ORAL_TABLET | Freq: Three times a day (TID) | ORAL | Status: DC
  Administered 2019-10-18 – 2019-10-19 (×3): 4 mg via ORAL
  Filled 2019-10-18 (×3): qty 1

## 2019-10-18 MED ORDER — ONDANSETRON 4 MG PO TBDP: 4.0000 mg | ORAL_TABLET | Freq: Three times a day (TID) | ORAL | Status: DC

## 2019-10-18 NOTE — Progress Notes (Signed)
Calorie Count Note  48 hour calorie count ordered.  Diet: Dysphagia 3, thin liquids Supplements: Boost Breeze TID, Ensure Enlive BID, 30 ml Prostat BID  Breakfast (1/11): 0% Lunch (1/11): 0% Dinner (1/11): 0% Breakfast (1/12): 100%--882 kcal, 22 grams protein Supplements: sips of Boost Breeze and Ensure this AM  Total intake: ~1000 kcal (47% of minimum estimated needs)  ~32 protein (30% of minimum estimated needs)  Nutrition Dx:  Increased nutrient needs related to acute illness, cancer and cancer related treatments as evidenced by estimated needs.  Goal:  Patient will meet greater than or equal to 90% of their needs  Intervention:  - continue Boost Breeze TID, Ensure Enlive BID, and 30 ml Prostat BID - continue to encourage PO intakes - RD will continue to follow per protocol     Jarome Matin, MS, RD, LDN, [facility] Inpatient Clinical Dietitian Pager # [phone] After hours/weekend pager # [phone]

## 2019-10-18 NOTE — Progress Notes (Addendum)
Patient ID: Audrey Gross, female   DOB: 10-06-1961, 59 y.o.   MRN: [id number]     Progress Note   Subjective  Day 4 CC : Dysphagia and odynophagia  EGD yesterday-grade D esophagitis, diffuse gastritis, and polypoid duodenal nodule.  Biopsies pending  Patient feels about the same, she is not having continuous esophageal pain, but uncomfortable with swallowing, has been feeling nauseated as well   Objective   Vital signs in last 24 hours: Temp:  [98.4 F (36.9 C)-99.3 F (37.4 C)] 99.3 F (37.4 C) (01/12 0432) Pulse Rate:  [79-95] 80 (01/12 0432) Resp:  [16-31] 17 (01/11 1932) BP: (147-174)/(71-96) 157/85 (01/12 0432) SpO2:  [99 %-100 %] 99 % (01/12 0432) Weight:  [86 kg] 86 kg (01/12 0455) Last BM Date: 10/15/19 General:   African-American female in NAD Heart:  Regular rate and rhythm; no murmurs Lungs: Respirations even and unlabored, lungs CTA bilaterally Abdomen:  Soft, nontender and nondistended. Normal bowel sounds. Extremities:  Without edema. Neurologic:  Alert and oriented,  grossly normal neurologically. Psych:  Cooperative. Normal mood and affect.  Intake/Output from previous day: 01/11 0701 - 01/12 0700 In: 1878.2 [P.O.:130; I.V.:1598.2; IV Piggyback:150] Out: 300 [Urine:300] Intake/Output this shift: No intake/output data recorded.  Lab Results: Recent Labs    10/17/19 0431  WBC 2.5*  HGB 10.2*  HCT 32.5*  PLT 210   BMET Recent Labs    10/16/19 0601 10/17/19 0431 10/18/19 0435  NA 144 145 142  K 3.0* 3.4* 3.5  CL 112* 114* 109  CO2 21* 21* 21*  GLUCOSE 97 89 87  BUN 5* 5* 6  CREATININE 0.69 0.60 0.60  CALCIUM 8.3* 8.6* 8.7*   LFT No results for input(s): PROT, ALBUMIN, AST, ALT, ALKPHOS, BILITOT, BILIDIR, IBILI in the last 72 hours. PT/INR No results for input(s): LABPROT, INR in the last 72 hours.      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 intake.  EGD yesterday showed severe diffuse grade D esophagitis, no obvious candidiasis.  Diffuse gastritis and a small duodenal nodule was biopsied.  Await biopsies today Continue soft diet as tolerated She has not been using the Magic mouthwash with lidocaine regularly as she feels it upsets her stomach more.  Continue twice daily Protonix IV, also on Pepcid Continue antiemetics around-the-clock Add Carafate suspension 1 g between meals and at bedtime Keep head of bed elevated We will continue IV Diflucan until biopsies reviewed    LOS: 5 days   Amy EsterwoodPA-C  10/18/2019, 10:10 AM   ________________________________________________________________________  Velora Heckler GI MD note:  I personally examined the patient, reviewed the data and agree with the assessment and plan described above.  Seems that her swallowing is slowly improving since she's been admitted.  Severe acid related damage, viral esophagitis are still highest on the ddx.  Perhaps candida esophagitis that has improved to the point of no obvious classic lesions in the esophagus after 3-4 days of diflucan.  Path results will be instructive when they are back. She knows to continue trying to eat as best that she can. Agree with BID PPI and continued diflucan treatments for now.   Owens Loffler, MD [facility] Pager [phone]

## 2019-10-18 NOTE — Progress Notes (Addendum)
[place] PROGRESS NOTE  Patient Care Team: Janith Lima, Audrey Gross as PCP - General (Internal Medicine) Constance Haw, Audrey Gross as PCP - Electrophysiology (Cardiology) Constance Haw, Audrey Gross as Consulting Physician (Cardiology)  I have seen her, examined and agreed with documentation as follows ASSESSMENT & PLAN:  Chronic lymphocytic leukemia Overall, she tolerated chemotherapy well She had positive response to treatment The small lymphadenopathy seen in her CT scan of the chest is not significant enough to have caused her symptoms She has not been able to take chemotherapy due to inability to swallow Continue supportive care for now  Severe dysphagia She had near 0 oral intake of food and water over the week prior to admission still unable to take in food and water She could not swallow any of her pills for over a week The patient was grossly dehydrated and oliguric prior to admission despite 2 L of IV fluids.  GI has been consulted and recommends trial of Magic mouthwash with lidocaine and IV fluconazole EGD today show severe esophagitis, biopsies pending Speech therapy has been consulted and recommends dysphagia 3 diet with thin liquids Will continue aggressive IV fluid for now Calorie count has been ordered and registered dietitian is following Started taking in more by mouth following EGD I encouraged her to try to eat I plan to reduce IVF to 50 cc/hour and eventually stop  Severe hypertension She has stopped a lot of her blood pressure medications from home I recommend her to try to resume oral medication-metoprolol has been restarted She is also on IV hydralazine; I will add lisinopril and reduce IVF I hope to stop IV hydralazine eventually  Severe hypokalemia This is due to poor oral intake Magnesium has now corrected Potassium is low normal today Continue IV fluids with potassium  Mild pancytopenia This is due to her recent treatment She is not  symptomatic Observe for now  Severe constipation, resolved MiraLAX and Senokot have been ordered  Nausea and vomiting This predates her hospital admission Continue as needed antiemetics  Severe esophagitis Biopsy pending She is on high-dose pantoprazole and Pepcid  Covid testing She is not symptomatic Her recent Covid testing on October 07, 2019 is negative Repeat Covid testing from 10/13/2019 is negative  DVT prophylaxis She was taking Eliquis in the past due to chronic atrial fibrillation She has not swallow Eliquis over the past week She is on DVT prophylaxis with Lovenox while she is hospitalized Lovenox was held for EGD yesterday and will plan to resume her Lovenox today for DVT prophylaxis  CODE STATUS Full code  Goals of care discussion Resolution of dysphagia.  Awaiting biopsies.  Remains dehydrated and is requiring aggressive IV fluid resuscitation.  She needs inpatient hospitalization because she has failed outpatient therapy.  The risk of not admitting her with the risk of death  Discharge planning I am hopeful we can discharge her over the next 2 to 3 days once her dysphagia resolved and she is able to adequately hydrate herself and swallow her pills    Audrey Bussing, Audrey Gross 10/18/2019 10:05 AM Audrey Lark, Audrey Gross  INTERVAL HISTORY:  Had upper endoscopy performed yesterday Results showed acute and erosive ulcerative esophagitis with no bleeding, gastritis, and polypoid nodule.  Biopsies pending. Continues to have dysphagia, but somewhat improved Was able to take in 1-1/2 bowls of soup last evening, breakfast tray had not yet arrived at the time my visit Reports intermittent nausea and vomiting Had a loose stool yesterday Still having  generalized weakness Having intermittent low-grade fevers but no chills Remains afebrile  Summary of oncologic history as follows: Oncology History Overview Note  17p positive   CLL (chronic lymphocytic leukemia) ([place])  04/05/2015  Pathology Results   Accession: [id number] flow cytometry confirmed CLL. FISH was positive for p53 mutation   04/24/2015 Imaging   Extensive lymphadenopathy throughout the neck, chest (axilla), abdomen and pelvis, as detailed above, compatible with the reported clinical history of lymphoma. 2. Mild splenomegaly.    05/03/2015 - 08/27/2015 Chemotherapy   She started on Ibrutinib, discontinued prematurely when her prescription ran out   10/10/2015 - 05/29/2017 Chemotherapy   She was restarted back on Ibrutinib   11/13/2016 PET scan   Significant generalized reduction in size of numerous lymph nodes in the neck, chest, abdomen, and pelvis. Previously the activity of these nodes was low-level and in general a similar low-level activity is present today, significantly less than mediastinal blood pool activity, compatible with Deauville 2. 2. Coronary atherosclerosis.  Mild cardiomegaly. 3. Mildly prominent endometrium for age without accentuated metabolic activity in the endometrium. Consider pelvic sonography for further characterization.   05/27/2017 PET scan   1. Progressive hypermetabolic adenopathy, primarily involving cervical, axillary, pelvic and inguinal lymph nodes bilaterally, consistent with progressive lymphoma. 2. No solid visceral organ or osseous involvement.   06/16/2017 - 08/25/2017 Chemotherapy   The patient had 3 cycles of Rituximab and Bendamustine   07/15/2017 - 07/19/2017 Hospital Admission   The patient was briefly admitted to the hospital due to infusion reaction to rituximab   09/18/2017 PET scan   1. Continued considerable adenopathy in the neck, chest, abdomen, and pelvis. This is generally stable in size but mildly reduced in activity compared to the prior exam. Current levels of activity primarily Deauville 3 and Deauville 4. No splenomegaly. 2. Diffuse new ground-glass opacities in the lungs with associated hypermetabolic activity. Some forms of lymphoma infiltration can  rarely cause this pattern of diffuse ground-glass opacity and hypermetabolic activity. Differential diagnostic considerations might include atypical pneumonia such as mycoplasma, acute hypersensitivity pneumonitis, or acute eosinophilic pneumonia. Pulmonary hemorrhage seems less likely to cause this degree of accentuated metabolic activity.  3.  Aortic Atherosclerosis (ICD10-I70.0).  Coronary atherosclerosis.   09/24/2017 -  Chemotherapy   The patient had ventoclax for chemotherapy treatment.  Rituximab is added on 11/18/17 to 04/09/18, x 6 cycles   11/19/2017 PET scan   Overall mild interval decrease in hypermetabolic lymphadenopathy throughout the neck, chest, abdomen, and pelvis. No new or increased lymphadenopathy identified.  While there has been resolution of diffuse hypermetabolic bilateral ground-glass pulmonary opacity since prior study, there is a new 14 mm hypermetabolic pulmonary nodule in the posterior right lower lobe. Time course favors inflammatory or infectious etiology over neoplasm. Recommend continued follow-up by chest CT in 3 months.   02/16/2018 PET scan   1. Adenopathy in the neck, chest, and pelvis is stable to minimally reduced in size, and is moderately reduced in activity, primarily Deauville 2 disease today. There is a right common iliac lymph node qualifying as Deauville 3 disease which is stable in size but reduced in activity. 2. Stable size but reduced activity in a pulmonary nodule in the right lower lobe. If this represents a leukemic lesion then a corresponds to Deauville 4 disease. This lesion was not readily apparent on 09/22/2017 but was reported on the prior chest CT of 11/19/2017. 3. Other imaging findings of potential clinical significance: Mild thyroid goiter. Aortic Atherosclerosis (ICD10-I70.0). Coronary atherosclerosis. Mild  cardiomegaly.   04/19/2018 PET scan   1. Interval mild mixed metabolic changes, as detailed. Persistent mildly hypermetabolic bilateral  axillary, mediastinal and bilateral pelvic adenopathy and mildly hypermetabolic right lower lobe pulmonary nodule compatible with lymphoproliferative disorder. Deauville 4 based on the subcarinal node. 2.  Aortic Atherosclerosis (ICD10-I70.0).   06/29/2018 Procedure   Successful placement of a right internal jugular approach power injectable Port-A-Cath. The catheter is ready for immediate use.   07/26/2018 PET scan   1. Adenopathy primarily in the chest and pelvis as noted above, with size of the mildly enlarged lymph nodes stable to minimally increased, but with nodal activity generally decreased compared to the prior exam. Uptake in these nodes is primarily Deauville 2. 2. Aortic Atherosclerosis (ICD10-I70.0). Coronary atherosclerosis with mild cardiomegaly.   10/29/2018 PET scan   1. Relatively stable sized lymphadenopathy but interval increase in hypermetabolism when compared to the most recent prior PET-CT, as detailed above. 2. No new disease is identified. 3. Stable bilateral pulmonary nodules.   11/12/2018 - 02/02/2019 Chemotherapy   The patient had riTUXimab (RITUXAN) 800 mg in sodium chloride 0.9 % 170 mL infusion, 375 mg/m2 = 800 mg, Intravenous,  Once, 3 of 4 cycles Administration: 800 mg (11/12/2018), 800 mg (12/10/2018), 800 mg (01/06/2019)  for chemotherapy treatment.    01/11/2019 Imaging   1. Significant increased adenopathy in the chest, abdomen, and pelvis compared to the 10/28/2018 PET-CT. 2. The scattered tiny pulmonary nodules and larger single right lower lobe pulmonary nodule are all stable. 3. Other imaging findings of potential clinical significance: Aortic Atherosclerosis (ICD10-I70.0). Coronary atherosclerosis. Mild cardiomegaly. Mild diffuse thyroid prominence, stable. Mild left foraminal impingement at L4-5.   01/21/2019 Procedure   Successful ultrasound-guided core biopsies of an enlarged right axillary lymph node.     01/21/2019 Pathology Results   Lymph node,  needle/core biopsy, Right Axilla - NON-HODGKIN Gross-CELL LYMPHOMA CONSISTENT WITH CHRONIC LYMPHOCYTIC LEUKEMIA/SMALL LYMPHOCYTIC LYMPHOMA - SEE COMMENT Microscopic Comment The biopsies are small core biopsies composed of a monotonous population of lymphocytes which are positive for CD20 without expression of cyclin-D1. The latter is important because it essentially rules out the possibility of mantle cell lymphoma. CD3 highlights a small population of T-cells. By flow cytometry, a kappa-restricted monoclonal Gross-cell population that expresses CD19, CD20, CD5, and CD23 comprises 89% of all lymphocytes. Overall, the features are consistent with chronic lymphocytic leukemia/small lymphocytic lymphoma.   01/31/2019 Echocardiogram    1. The left ventricle has normal systolic function, with an ejection fraction of 55-60%. The cavity size was normal. Left ventricular diastolic Doppler parameters are consistent with impaired relaxation. No evidence of left ventricular regional wall motion abnormalities. GLS -20%.  2. The right ventricle has normal systolic function. The cavity was normal. There is no increase in right ventricular wall thickness.  3. The aortic valve is tricuspid. Mild calcification of the aortic valve. No stenosis of the aortic valve.  4. The aortic root is normal in size and structure.  5. 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  6. No evidence of mitral valve stenosis. No significant mitral regurgitation.  7. Normal IVC size. No complete TR doppler jet so unable to estimate PA systolic pressure.   02/03/2019 - 07/24/2019 Chemotherapy   The patient had obinutuzumab and Acalabrutinib for chemotherapy treatment.    05/02/2019 Imaging   1. Interval decrease in size axillary and mediastinal adenopathy. 2. There are a few retroperitoneal lymph nodes which are mildly increased in size. The majority of  the retroperitoneal and pelvic lymph nodes are grossly similar when compared to  prior exam. 3. Stable scattered pulmonary nodules.   07/22/2019 Imaging   1. Progressive disease within the neck, chest, abdomen, and pelvis, as evidenced by increase in adenopathy. 2. Similar bilateral pulmonary nodules. 3. Extensive right-sided colonic pneumatosis and small volume extracolonic intraperitoneal gas. Correlate with abdominal symptoms. Of note, pneumatosis has been described in patients on immunotherapy. 4. Age advanced coronary artery atherosclerosis. Recommend assessment of coronary risk factors and consideration of medical therapy. 5.  Aortic Atherosclerosis (ICD10-I70.0). 6. Similar mild L1 compression deformity. 7. Trace free pelvic fluid, similar. 8. Pulmonary artery enlargement suggests pulmonary arterial hypertension.   08/11/2019 -  Chemotherapy   The patient had duvelisib for chemotherapy treatment.     10/12/2019 Imaging   1. Slight enlargement of a high right paratracheal lymph node. Otherwise, lymph nodes in the axillary regions, abdomen and pelvis have decreased in size in the interval, consistent with treatment response. 2. Interval decrease in size and number of pulmonary nodules, indicative of metastatic disease. 3. There may be mild nonspecific diffuse esophageal wall thickening. 4. Aortic atherosclerosis (ICD10-I70.0). Coronary artery calcification. 5. Enlarged pulmonic trunk, indicative of arterial hypertension.   10/13/2019 -  Hospital Admission   She was admitted for severe dysphagia, dehydration and electrolytes abnormalities     MEDICAL HISTORY:  Past Medical History:  Diagnosis Date   Atrial fibrillation ([place])    Cystic breast    Depression    Diffuse cystic mastopathy    Dyslipidemia (high LDL; low HDL)    Hypertension    Iron deficiency anemia, unspecified    Lymphadenopathy of head and neck 04/05/2015   Medical non-compliance    Mitral regurgitation 02/2017   moderate to severe   Persistent atrial fibrillation with rapid ventricular  response ([place]) 07/15/2017    SURGICAL HISTORY: Past Surgical History:  Procedure Laterality Date   CARDIOVERSION N/A 04/02/2017   Procedure: CARDIOVERSION;  Surgeon: Dorothy Spark, Audrey Gross;  Location: [facility];  Service: Cardiovascular;  Laterality: N/A;   CARDIOVERSION N/A 08/19/2017   Procedure: CARDIOVERSION;  Surgeon: Larey Dresser, Audrey Gross;  Location: [facility] ENDOSCOPY;  Service: Cardiovascular;  Laterality: N/A;   IR IMAGING GUIDED PORT INSERTION  06/29/2018   TUBAL LIGATION      SOCIAL HISTORY: Social History   Socioeconomic History   Marital status: Married    Spouse name: Not on file   Number of children: 6   Years of education: Not on file   Highest education level: Not on file  Occupational History   Occupation: Heritage manager: [employer]  Tobacco Use   Smoking status: Never Smoker   Smokeless tobacco: Never Used  Substance and Sexual Activity   Alcohol use: No   Drug use: No   Sexual activity: Not Currently    Partners: Male    Birth control/protection: Surgical    Comment: tubal ligation  Other Topics Concern   Not on file  Social History Narrative   Not on file   Social Determinants of Health   Financial Resource Strain:    Difficulty of Paying Living Expenses: Not on file  Food Insecurity:    Worried About [place] in the Last Year: Not on file   [employer] of Food in the Last Year: Not on file  Transportation Needs:    Lack of Transportation (Medical): Not on file   Lack of Transportation (Non-Medical): Not on file  Physical Activity:  Days of Exercise per Week: Not on file   Minutes of Exercise per Session: Not on file  Stress:    Feeling of Stress : Not on file  Social Connections:    Frequency of Communication with Friends and Family: Not on file   Frequency of Social Gatherings with Friends and Family: Not on file   Attends Religious Services: Not on file   Active Member of Clubs or Organizations: Not on file   Attends Theatre manager Meetings: Not on file   Marital Status: Not on file  Intimate Partner Violence:    Fear of Current or Ex-Partner: Not on file   Emotionally Abused: Not on file   Physically Abused: Not on file   Sexually Abused: Not on file    FAMILY HISTORY: Family History  Problem Relation Age of Onset   Sudden death Mother    Kidney disease Father        H/O HD and kidney transplant   Stomach cancer Maternal Aunt    Stomach cancer Paternal Grandmother    Heart disease Brother    Heart disease Maternal Grandmother     ALLERGIES:  has No Known Allergies.  MEDICATIONS:  Current Facility-Administered Medications  Medication Dose Route Frequency Provider Last Rate Last Admin   0.9 % NaCl with KCl 40 mEq / L  infusion   Intravenous Continuous Audrey Gross, Audrey Colmenares, Audrey Gross 100 mL/hr at 10/18/19 0614 Rate Verify at 10/18/19 5852   acetaminophen (TYLENOL) tablet 650 mg  650 mg Oral Q4H PRN Audrey Gross, Audrey Archuletta, Audrey Gross       chlorhexidine (PERIDEX) 0.12 % solution 15 mL  15 mL Mouth Rinse BID Audrey Gross, Audrey Monk, Audrey Gross   15 mL at 10/17/19 2141   Chlorhexidine Gluconate Cloth 2 % PADS 6 each  6 each Topical Daily Audrey Lark, Audrey Gross   6 each at 10/17/19 0904   famotidine (PEPCID) IVPB 20 mg premix  20 mg Intravenous Q12H Audrey Lark, Audrey Gross   Stopped at 10/17/19 2212   feeding supplement (BOOST / RESOURCE BREEZE) liquid 1 Container  1 Container Oral TID BM Audrey Gross, Audrey Jaco, Audrey Gross   1 Container at 10/15/19 1008   feeding supplement (ENSURE ENLIVE) (ENSURE ENLIVE) liquid 237 mL  237 mL Oral BID BM Audrey Madura, Audrey Gross       feeding supplement (PRO-STAT SUGAR FREE 64) liquid 30 mL  30 mL Oral BID Audrey Gross, Trayvion Embleton, Audrey Gross   30 mL at 10/16/19 1239   fluconazole (DIFLUCAN) IVPB 100 mg  100 mg Intravenous Q24H Willia Craze, Audrey Gross 50 mL/hr at 10/18/19 0815 100 mg at 10/18/19 0815   hydrALAZINE (APRESOLINE) injection 10 mg  10 mg Intravenous Q6H Kennie Snedden, Audrey Gross   10 mg at 10/18/19 0433   lidocaine-prilocaine (EMLA) cream 1 application  1 application Topical PRN  Audrey Gross, Maxie Debose, Audrey Gross       magic mouthwash w/lidocaine  10 mL Oral TID WC, HS, 0200 [place], Amy S, PA-C   10 mL at 10/18/19 7782   MEDLINE mouth rinse  15 mL Mouth Rinse q12n4p Jaena Brocato, Audrey Gross   15 mL at 10/17/19 1728   metoprolol succinate (TOPROL-XL) 24 hr tablet 100 mg  100 mg Oral Daily Audrey Gross, Gevin Perea, Audrey Gross   100 mg at 10/17/19 1413   multivitamin with minerals tablet 1 tablet  1 tablet Oral Daily Audrey Lark, Audrey Gross   Stopped at 10/17/19 0905   ondansetron (ZOFRAN) tablet 4-8 mg  4-8 mg Oral Q8H PRN Audrey Lark, Audrey Gross  Or   ondansetron (ZOFRAN-ODT) disintegrating tablet 4-8 mg  4-8 mg Oral Q8H PRN Audrey Lark, Audrey Gross   4 mg at 10/14/19 2003   Or   ondansetron (ZOFRAN) injection 4 mg  4 mg Intravenous Q8H PRN Audrey Gross, Nary Sneed, Audrey Gross   4 mg at 10/15/19 1351   Or   ondansetron (ZOFRAN) 8 mg in sodium chloride 0.9 % 50 mL IVPB  8 mg Intravenous Q8H PRN Audrey Gross, Annalia Metzger, Audrey Gross       pantoprazole (PROTONIX) injection 40 mg  40 mg Intravenous Q12H Pyrtle, Lajuan Lines, Audrey Gross   40 mg at 10/17/19 2141   phenol (CHLORASEPTIC) mouth spray 1 spray  1 spray Mouth/Throat PRN Ladell Pier, Audrey Gross       polyethylene glycol (MIRALAX / GLYCOLAX) packet 17 g  17 g Oral Daily Esterwood, Amy S, PA-C   Stopped at 10/17/19 1010   promethazine (PHENERGAN) injection 12.5 mg  12.5 mg Intravenous Q6H PRN Ladell Pier, Audrey Gross   12.5 mg at 10/16/19 0839   senna-docusate (Senokot-S) tablet 1 tablet  1 tablet Oral QHS PRN Audrey Lark, Audrey Gross        REVIEW OF SYSTEMS:   Constitutional: No fevers, chills or abnormal night sweats Eyes: Denies blurriness of vision, double vision or watery eyes Respiratory: Denies cough, dyspnea or wheezes Cardiovascular: Denies palpitation, chest discomfort or lower extremity swelling Skin: Denies abnormal skin rashes Lymphatics: Denies new lymphadenopathy or easy bruising Behavioral/Psych: Mood is stable, no new changes  All other systems were reviewed with the patient and are negative.  PHYSICAL EXAMINATION: ECOG  PERFORMANCE STATUS: 1 - Symptomatic but completely ambulatory BP (!) 157/85 (BP Location: Left Arm)   Pulse 80   Temp 99.3 F (37.4 C) (Oral)   Resp 17   Ht '5\' 7"'$  (1.702 m)   Wt 189 lb 9.5 oz (86 kg) Comment: 189.2lbs  LMP 02/03/2017 (Approximate)   SpO2 99%   BMI 29.69 kg/m    Intake/Output Summary (Last 24 hours) at 10/18/2019 1005 Last data filed at 10/18/2019 0998 Gross per 24 hour  Intake 1521.02 ml  Output 300 ml  Net 1221.02 ml   GENERAL:alert, no distress and comfortable SKIN: skin color, texture, turgor are normal, no rashes or significant lesions EYES: normal, conjunctiva are pink and non-injected, sclera clear OROPHARYNX: White coating on tongue, excoriation in her posterior pharynx NECK: supple, thyroid normal size, non-tender, without nodularity LYMPH:  no palpable lymphadenopathy in the cervical, axillary or inguinal LUNGS: clear to auscultation and percussion with normal breathing effort HEART: regular rate & rhythm and no murmurs and no lower extremity edema ABDOMEN:abdomen soft, non-tender and normal bowel sounds Musculoskeletal:no cyanosis of digits and no clubbing  PSYCH: alert & oriented x 3 with fluent speech NEURO: no focal motor/sensory deficits  LABORATORY DATA:  I have reviewed the data as listed Lab Results  Component Value Date   WBC 2.5 (L) 10/17/2019   HGB 10.2 (L) 10/17/2019   HCT 32.5 (L) 10/17/2019   MCV 90.8 10/17/2019   PLT 210 10/17/2019   Recent Labs    09/06/19 1147 10/03/19 1238 10/13/19 0843 10/16/19 0601 10/17/19 0431 10/18/19 0435  NA 141 144 145 144 145 142  K 4.2 4.1 2.9* 3.0* 3.4* 3.5  CL 104 108 107 112* 114* 109  CO2 27 25 18* 21* 21* 21*  GLUCOSE 107* 117* 97 97 89 87  BUN '11 9 12 '$ 5* 5* 6  CREATININE 0.82 0.81 0.86 0.69 0.60 0.60  CALCIUM 9.3 9.3 9.0  8.3* 8.6* 8.7*  GFRNONAA >60 >60 >60 >60 >60 >60  GFRAA >60 >60 >60 >60 >60 >60  PROT 6.4* 6.8 6.9  --   --   --   ALBUMIN 3.9 3.8 3.9  --   --   --   AST 14*  13* 20  --   --   --   ALT _0 --   --   --   ALKPHOS 61 59 58  --   --   --   BILITOT 0.8 0.8 0.9  --   --   --    EGD report is reviewed RADIOGRAPHIC STUDIES: I have personally reviewed the radiological images as listed and agreed with the findings in the report. CT Chest W Contrast  Result Date: 10/12/2019 CLINICAL DATA:  CLL. New dysphagia. Ongoing chemotherapy pill. Cough with vomiting. EXAM: CT CHEST, ABDOMEN, AND PELVIS WITH CONTRAST TECHNIQUE: Multidetector CT imaging of the chest, abdomen and pelvis was performed following the standard protocol during bolus administration of intravenous contrast. CONTRAST:  13m OMNIPAQUE IOHEXOL 300 MG/ML  SOLN COMPARISON:  07/22/2019. FINDINGS: CT CHEST FINDINGS Cardiovascular: Right IJ Port-A-Cath terminates in the high right atrium. Atherosclerotic calcification of the aorta and coronary arteries. Pulmonic trunk is enlarged. Heart is at the upper limits of normal in size to mildly enlarged. No pericardial effusion. Mediastinum/Nodes: High right paratracheal lymph node is enlarged slightly now measuring 7 mm (2/12) previously 4 mm. No additional pathologically enlarged mediastinal or hilar lymph nodes. Subpectoral and axillary lymph nodes have decreased in size in the interval. Index left axillary lymph node measures 11 mm (2/10), compared to 1.9 cm on 07/22/2019. There may be mild diffuse esophageal wall thickening. Lungs/Pleura: Minimal biapical pleuroparenchymal scarring. Decrease in size and number of bilateral pulmonary nodules. For example, a 3 mm nodule seen in the anterior segment right upper lobe on 07/22/2019 is no longer visualized. 12 mm medial right lower lobe nodule (4/79), decreased from 15 mm. No pleural fluid. Airway is unremarkable. Musculoskeletal: No worrisome lytic or sclerotic lesions. CT ABDOMEN PELVIS FINDINGS Hepatobiliary: Liver and gallbladder are unremarkable. No biliary ductal dilatation. Pancreas: Negative. Spleen: Negative.  Adrenals/Urinary Tract: Adrenal glands are unremarkable. Low-attenuation lesions in the right kidney measure up to 1.5 cm and are likely cysts. Kidneys are otherwise unremarkable. Ureters are decompressed. Bladder is low in volume. Stomach/Bowel: Stomach, small bowel, appendix and colon are unremarkable. Vascular/Lymphatic: Atherosclerotic calcification of the aorta without aneurysm. Retroperitoneal lymph nodes have decreased in size in the interval. Index left periaortic lymph node measures 6 mm (2/76), previously 10 mm. Abdominal peritoneal ligament lymph nodes are not enlarged by CT size criteria, similar to the prior exam. Mesenteric lymph nodes have decreased in size. Index lymph node near the root of the small bowel mesentery measures 4 mm (2/66), previously 7 mm. Pelvic retroperitoneal lymph nodes have decreased in size in the interval as well. Index left external iliac lymph node measures 10 mm (2/104), previously 1.6 cm. Inguinal lymph nodes are not enlarged by CT size criteria. Reproductive: Uterus is visualized.  No adnexal mass. Other: Mild laxity of the ventral abdominal wall at the umbilicus. No free fluid. Mesenteries and peritoneum are otherwise unremarkable. Musculoskeletal: No worrisome lytic or sclerotic lesions. Mild L1 compression deformity, unchanged. IMPRESSION: 1. Slight enlargement of a high right paratracheal lymph node. Otherwise, lymph nodes in the axillary regions, abdomen and pelvis have decreased in size in the interval, consistent with treatment response. 2. Interval decrease in size and  number of pulmonary nodules, indicative of metastatic disease. 3. There may be mild nonspecific diffuse esophageal wall thickening. 4. Aortic atherosclerosis (ICD10-I70.0). Coronary artery calcification. 5. Enlarged pulmonic trunk, indicative of arterial hypertension. Electronically Signed   By: Audrey Picket M.D.   On: 10/12/2019 13:05   CT Abdomen Pelvis W Contrast  Result Date:  10/12/2019 CLINICAL DATA:  CLL. New dysphagia. Ongoing chemotherapy pill. Cough with vomiting. EXAM: CT CHEST, ABDOMEN, AND PELVIS WITH CONTRAST TECHNIQUE: Multidetector CT imaging of the chest, abdomen and pelvis was performed following the standard protocol during bolus administration of intravenous contrast. CONTRAST:  158m OMNIPAQUE IOHEXOL 300 MG/ML  SOLN COMPARISON:  07/22/2019. FINDINGS: CT CHEST FINDINGS Cardiovascular: Right IJ Port-A-Cath terminates in the high right atrium. Atherosclerotic calcification of the aorta and coronary arteries. Pulmonic trunk is enlarged. Heart is at the upper limits of normal in size to mildly enlarged. No pericardial effusion. Mediastinum/Nodes: High right paratracheal lymph node is enlarged slightly now measuring 7 mm (2/12) previously 4 mm. No additional pathologically enlarged mediastinal or hilar lymph nodes. Subpectoral and axillary lymph nodes have decreased in size in the interval. Index left axillary lymph node measures 11 mm (2/10), compared to 1.9 cm on 07/22/2019. There may be mild diffuse esophageal wall thickening. Lungs/Pleura: Minimal biapical pleuroparenchymal scarring. Decrease in size and number of bilateral pulmonary nodules. For example, a 3 mm nodule seen in the anterior segment right upper lobe on 07/22/2019 is no longer visualized. 12 mm medial right lower lobe nodule (4/79), decreased from 15 mm. No pleural fluid. Airway is unremarkable. Musculoskeletal: No worrisome lytic or sclerotic lesions. CT ABDOMEN PELVIS FINDINGS Hepatobiliary: Liver and gallbladder are unremarkable. No biliary ductal dilatation. Pancreas: Negative. Spleen: Negative. Adrenals/Urinary Tract: Adrenal glands are unremarkable. Low-attenuation lesions in the right kidney measure up to 1.5 cm and are likely cysts. Kidneys are otherwise unremarkable. Ureters are decompressed. Bladder is low in volume. Stomach/Bowel: Stomach, small bowel, appendix and colon are unremarkable.  Vascular/Lymphatic: Atherosclerotic calcification of the aorta without aneurysm. Retroperitoneal lymph nodes have decreased in size in the interval. Index left periaortic lymph node measures 6 mm (2/76), previously 10 mm. Abdominal peritoneal ligament lymph nodes are not enlarged by CT size criteria, similar to the prior exam. Mesenteric lymph nodes have decreased in size. Index lymph node near the root of the small bowel mesentery measures 4 mm (2/66), previously 7 mm. Pelvic retroperitoneal lymph nodes have decreased in size in the interval as well. Index left external iliac lymph node measures 10 mm (2/104), previously 1.6 cm. Inguinal lymph nodes are not enlarged by CT size criteria. Reproductive: Uterus is visualized.  No adnexal mass. Other: Mild laxity of the ventral abdominal wall at the umbilicus. No free fluid. Mesenteries and peritoneum are otherwise unremarkable. Musculoskeletal: No worrisome lytic or sclerotic lesions. Mild L1 compression deformity, unchanged. IMPRESSION: 1. Slight enlargement of a high right paratracheal lymph node. Otherwise, lymph nodes in the axillary regions, abdomen and pelvis have decreased in size in the interval, consistent with treatment response. 2. Interval decrease in size and number of pulmonary nodules, indicative of metastatic disease. 3. There may be mild nonspecific diffuse esophageal wall thickening. 4. Aortic atherosclerosis (ICD10-I70.0). Coronary artery calcification. 5. Enlarged pulmonic trunk, indicative of arterial hypertension. Electronically Signed   By: MLorin PicketM.D.   On: 10/12/2019 13:05   DG Swallowing Func-Speech Pathology  Result Date: 10/14/2019 Objective Swallowing Evaluation: Type of Study: MBS-Modified Barium Swallow Study  Patient Details Name: COSA FOGARTYMRN: [id number] of  Birth: 1961-06-23 Today's Date: 10/14/2019 Time: SLP Start Time (ACUTE ONLY): 1436 -SLP Stop Time (ACUTE ONLY): 0272 SLP Time Calculation (min) (ACUTE ONLY): 17 min  Past Medical History: Past Medical History: Diagnosis Date  Atrial fibrillation ([place])   Cystic breast   Depression   Diffuse cystic mastopathy   Dyslipidemia (high LDL; low HDL)   Hypertension   Iron deficiency anemia, unspecified   Lymphadenopathy of head and neck 04/05/2015  Medical non-compliance   Mitral regurgitation 02/2017  moderate to severe  Persistent atrial fibrillation with rapid ventricular response ([place]) 07/15/2017 Past Surgical History: Past Surgical History: Procedure Laterality Date  CARDIOVERSION N/A 04/02/2017  Procedure: CARDIOVERSION;  Surgeon: Dorothy Spark, Audrey Gross;  Location: [facility];  Service: Cardiovascular;  Laterality: N/A;  CARDIOVERSION N/A 08/19/2017  Procedure: CARDIOVERSION;  Surgeon: Larey Dresser, Audrey Gross;  Location: [place];  Service: Cardiovascular;  Laterality: N/A;  IR IMAGING GUIDED PORT INSERTION  06/29/2018  TUBAL LIGATION   HPI: SHERA LAUBACH is a 59 y.o. female with chronic lymphocytic leukemia. Per chart saw Dr. Ardis Hughs for dysphagia in 2018 for evaluation of dysphagia. Per chart, chemotherapy has been on hold due to her inability to swallow, odonophagia and seen  week ago for  evaluation of nausea, vomiting. Additionally any thing she eats gets stuck and she subsequently vomits it up. This is not like the dysphagia she had in 2018, it is much worse. Chest CT suggests mild non-specific esophageal wall thickening. Suspect oral / esophageal candida but viral etiologies such as HSV, CMV cannot be excluded. Some mild improvement with Diflucan + Magic mouthwash  No data recorded Assessment / Plan / Recommendation CHL IP CLINICAL IMPRESSIONS 10/14/2019 Clinical Impression Pt exhibits significant labial and facial weakness and reduced ROM. She affirmed she was told she had Bell's palsy however this therapist unable to find confirmation through documentation. Despite impairments and oral candidias she was able to functionally masticate, manipulate and transit puree, thin as  well as regular texture boluses and denied odonophagia during study. Thin barium was penetrated on two trials however completely exited laryngeal vestibule during the swallow (one was close to cords). Chin tuck prevented but pt stated it hurt her neck and did not wish to perform. Mild residue mid esophagus(MBS does not diagnose below level of UES). Discussed options for diet textures and she stated she would not eat puree and "could order soft food." Recommend Dys 3, thin, crushed pills and allow pt to choose foods. ST will continue to see for tolerance of recommendations.   SLP Visit Diagnosis Dysphagia, pharyngeal phase (R13.13) Attention and concentration deficit following -- Frontal lobe and executive function deficit following -- Impact on safety and function Mild aspiration risk   CHL IP TREATMENT RECOMMENDATION 10/14/2019 Treatment Recommendations Therapy as outlined in treatment plan below   Prognosis 10/14/2019 Prognosis for Safe Diet Advancement Good Barriers to Reach Goals -- Barriers/Prognosis Comment -- CHL IP DIET RECOMMENDATION 10/14/2019 SLP Diet Recommendations Dysphagia 3 (Mech soft) solids;Thin liquid Liquid Administration via Cup;Straw Medication Administration Crushed with puree Compensations Slow rate;Small sips/bites Postural Changes Seated upright at 90 degrees   CHL IP OTHER RECOMMENDATIONS 10/14/2019 Recommended Consults -- Oral Care Recommendations Oral care BID Other Recommendations --   CHL IP FOLLOW UP RECOMMENDATIONS 10/14/2019 Follow up Recommendations Other (comment)   CHL IP FREQUENCY AND DURATION 10/14/2019 Speech Therapy Frequency (ACUTE ONLY) min 2x/week Treatment Duration 2 weeks      CHL IP ORAL PHASE 10/14/2019 Oral Phase WFL Oral - Pudding Teaspoon --  Oral - Pudding Cup -- Oral - Honey Teaspoon -- Oral - Honey Cup -- Oral - Nectar Teaspoon -- Oral - Nectar Cup -- Oral - Nectar Straw -- Oral - Thin Teaspoon -- Oral - Thin Cup -- Oral - Thin Straw -- Oral - Puree -- Oral - Mech Soft -- Oral  - Regular -- Oral - Multi-Consistency -- Oral - Pill -- Oral Phase - Comment --  CHL IP PHARYNGEAL PHASE 10/14/2019 Pharyngeal Phase Impaired Pharyngeal- Pudding Teaspoon -- Pharyngeal -- Pharyngeal- Pudding Cup -- Pharyngeal -- Pharyngeal- Honey Teaspoon -- Pharyngeal -- Pharyngeal- Honey Cup -- Pharyngeal -- Pharyngeal- Nectar Teaspoon -- Pharyngeal -- Pharyngeal- Nectar Cup WFL;Pharyngeal residue - valleculae Pharyngeal -- Pharyngeal- Nectar Straw -- Pharyngeal -- Pharyngeal- Thin Teaspoon -- Pharyngeal -- Pharyngeal- Thin Cup Penetration/Aspiration during swallow Pharyngeal Material enters airway, CONTACTS cords and then ejected out Pharyngeal- Thin Straw WFL Pharyngeal -- Pharyngeal- Puree -- Pharyngeal -- Pharyngeal- Mechanical Soft WFL Pharyngeal -- Pharyngeal- Regular -- Pharyngeal -- Pharyngeal- Multi-consistency -- Pharyngeal -- Pharyngeal- Pill -- Pharyngeal -- Pharyngeal Comment --  CHL IP CERVICAL ESOPHAGEAL PHASE 10/14/2019 Cervical Esophageal Phase WFL Pudding Teaspoon -- Pudding Cup -- Honey Teaspoon -- Honey Cup -- Nectar Teaspoon -- Nectar Cup -- Nectar Straw -- Thin Teaspoon -- Thin Cup -- Thin Straw -- Puree -- Mechanical Soft -- Regular -- Multi-consistency -- Pill -- Cervical Esophageal Comment -- [place] Siren 10/14/2019, 5:03 PM  Orbie Pyo Litaker M.Ed Risk analyst [phone] Office [phone]

## 2019-10-19 ENCOUNTER — Encounter: Payer: Self-pay | Admitting: *Deleted

## 2019-10-19 LAB — SURGICAL PATHOLOGY

## 2019-10-19 MED ORDER — PANTOPRAZOLE SODIUM 40 MG PO TBEC: 40.0000 mg | DELAYED_RELEASE_TABLET | Freq: Two times a day (BID) | ORAL | 3 refills | Status: DC

## 2019-10-19 MED ORDER — HEPARIN SOD (PORK) LOCK FLUSH 100 UNIT/ML IV SOLN
500.0000 [IU] | INTRAVENOUS | Status: AC | PRN
  Administered 2019-10-19: 15:00:00 500 [IU]

## 2019-10-19 MED ORDER — SUCRALFATE 1 GM/10ML PO SUSP: 1.0000 g | Freq: Three times a day (TID) | ORAL | 0 refills | Status: DC

## 2019-10-19 NOTE — Discharge Summary (Signed)
Physician Discharge Summary  Patient ID: GANELL GALANOS MRN: [id number] DOB/AGE: 04-03-1961 59 y.o.  Admit date: 10/13/2019 Discharge date: 10/19/2019  Primary Care Physician:  Janith Lima, MD   Discharge Diagnoses:    Present on Admission: . CLL (chronic lymphocytic leukemia) ([place])   Discharge Medications:  Allergies as of 10/19/2019   No Known Allergies     Medication List    STOP taking these medications   allopurinol 300 MG tablet Commonly known as: ZYLOPRIM   benzonatate 100 MG capsule Commonly known as: TESSALON   furosemide 20 MG tablet Commonly known as: LASIX   potassium chloride SA 20 MEQ tablet Commonly known as: KLOR-CON   promethazine 25 MG suppository Commonly known as: PHENERGAN   promethazine-dextromethorphan 6.25-15 MG/5ML syrup Commonly known as: PROMETHAZINE-DM     TAKE these medications   Copiktra 25 MG Caps Generic drug: Duvelisib Take 25 mg by mouth 2 (two) times daily. Take with or without food, approximately 12 hours apart.   Eliquis 5 MG Tabs tablet Generic drug: apixaban TAKE 1 TABLET BY MOUTH TWICE A DAY   Entresto 49-51 MG Generic drug: sacubitril-valsartan Take 1 tablet by mouth 2 (two) times daily.   lidocaine-prilocaine cream Commonly known as: EMLA Apply 1 application topically as needed. What changed: reasons to take this   metoprolol succinate 100 MG 24 hr tablet Commonly known as: TOPROL-XL TAKE 1 AND 1/2 TABLETS BY MOUTH EVERY DAY. TAKE WITH FOOD OR RIGHT AFTER A MEAL   ondansetron 8 MG disintegrating tablet Commonly known as: ZOFRAN-ODT Take 1 tablet (8 mg total) by mouth every 8 (eight) hours as needed for nausea or vomiting.   pantoprazole 40 MG tablet Commonly known as: Protonix Take 1 tablet (40 mg total) by mouth 2 (two) times daily.   sucralfate 1 GM/10ML suspension Commonly known as: CARAFATE Take 10 mLs (1 g total) by mouth 3 (three) times daily between meals.    sulfamethoxazole-trimethoprim 400-80 MG tablet Commonly known as: Bactrim Take 1 tablet by mouth daily.   valGANciclovir 450 MG tablet Commonly known as: VALCYTE Take 2 tablets (900 mg total) by mouth daily.   Vitamin D-3 25 MCG (1000 UT) Caps Take 1,000 Units by mouth daily with breakfast.       Disposition and Follow-up: She will be seen in the outpatient cancer center on October 27, 2019  Significant Diagnostic Studies:  CT Chest W Contrast  Result Date: 10/12/2019 CLINICAL DATA:  CLL. New dysphagia. Ongoing chemotherapy pill. Cough with vomiting. EXAM: CT CHEST, ABDOMEN, AND PELVIS WITH CONTRAST TECHNIQUE: Multidetector CT imaging of the chest, abdomen and pelvis was performed following the standard protocol during bolus administration of intravenous contrast. CONTRAST:  139mL OMNIPAQUE IOHEXOL 300 MG/ML  SOLN COMPARISON:  07/22/2019. FINDINGS: CT CHEST FINDINGS Cardiovascular: Right IJ Port-A-Cath terminates in the high right atrium. Atherosclerotic calcification of the aorta and coronary arteries. Pulmonic trunk is enlarged. Heart is at the upper limits of normal in size to mildly enlarged. No pericardial effusion. Mediastinum/Nodes: High right paratracheal lymph node is enlarged slightly now measuring 7 mm (2/12) previously 4 mm. No additional pathologically enlarged mediastinal or hilar lymph nodes. Subpectoral and axillary lymph nodes have decreased in size in the interval. Index left axillary lymph node measures 11 mm (2/10), compared to 1.9 cm on 07/22/2019. There may be mild diffuse esophageal wall thickening. Lungs/Pleura: Minimal biapical pleuroparenchymal scarring. Decrease in size and number of bilateral pulmonary nodules. For example, a 3 mm nodule seen in the anterior  segment right upper lobe on 07/22/2019 is no longer visualized. 12 mm medial right lower lobe nodule (4/79), decreased from 15 mm. No pleural fluid. Airway is unremarkable. Musculoskeletal: No worrisome lytic or  sclerotic lesions. CT ABDOMEN PELVIS FINDINGS Hepatobiliary: Liver and gallbladder are unremarkable. No biliary ductal dilatation. Pancreas: Negative. Spleen: Negative. Adrenals/Urinary Tract: Adrenal glands are unremarkable. Low-attenuation lesions in the right kidney measure up to 1.5 cm and are likely cysts. Kidneys are otherwise unremarkable. Ureters are decompressed. Bladder is low in volume. Stomach/Bowel: Stomach, small bowel, appendix and colon are unremarkable. Vascular/Lymphatic: Atherosclerotic calcification of the aorta without aneurysm. Retroperitoneal lymph nodes have decreased in size in the interval. Index left periaortic lymph node measures 6 mm (2/76), previously 10 mm. Abdominal peritoneal ligament lymph nodes are not enlarged by CT size criteria, similar to the prior exam. Mesenteric lymph nodes have decreased in size. Index lymph node near the root of the small bowel mesentery measures 4 mm (2/66), previously 7 mm. Pelvic retroperitoneal lymph nodes have decreased in size in the interval as well. Index left external iliac lymph node measures 10 mm (2/104), previously 1.6 cm. Inguinal lymph nodes are not enlarged by CT size criteria. Reproductive: Uterus is visualized.  No adnexal mass. Other: Mild laxity of the ventral abdominal wall at the umbilicus. No free fluid. Mesenteries and peritoneum are otherwise unremarkable. Musculoskeletal: No worrisome lytic or sclerotic lesions. Mild L1 compression deformity, unchanged. IMPRESSION: 1. Slight enlargement of a high right paratracheal lymph node. Otherwise, lymph nodes in the axillary regions, abdomen and pelvis have decreased in size in the interval, consistent with treatment response. 2. Interval decrease in size and number of pulmonary nodules, indicative of metastatic disease. 3. There may be mild nonspecific diffuse esophageal wall thickening. 4. Aortic atherosclerosis (ICD10-I70.0). Coronary artery calcification. 5. Enlarged pulmonic trunk,  indicative of arterial hypertension. Electronically Signed   By: Lorin Picket M.D.   On: 10/12/2019 13:05   CT Abdomen Pelvis W Contrast  Result Date: 10/12/2019 CLINICAL DATA:  CLL. New dysphagia. Ongoing chemotherapy pill. Cough with vomiting. EXAM: CT CHEST, ABDOMEN, AND PELVIS WITH CONTRAST TECHNIQUE: Multidetector CT imaging of the chest, abdomen and pelvis was performed following the standard protocol during bolus administration of intravenous contrast. CONTRAST:  128mL OMNIPAQUE IOHEXOL 300 MG/ML  SOLN COMPARISON:  07/22/2019. FINDINGS: CT CHEST FINDINGS Cardiovascular: Right IJ Port-A-Cath terminates in the high right atrium. Atherosclerotic calcification of the aorta and coronary arteries. Pulmonic trunk is enlarged. Heart is at the upper limits of normal in size to mildly enlarged. No pericardial effusion. Mediastinum/Nodes: High right paratracheal lymph node is enlarged slightly now measuring 7 mm (2/12) previously 4 mm. No additional pathologically enlarged mediastinal or hilar lymph nodes. Subpectoral and axillary lymph nodes have decreased in size in the interval. Index left axillary lymph node measures 11 mm (2/10), compared to 1.9 cm on 07/22/2019. There may be mild diffuse esophageal wall thickening. Lungs/Pleura: Minimal biapical pleuroparenchymal scarring. Decrease in size and number of bilateral pulmonary nodules. For example, a 3 mm nodule seen in the anterior segment right upper lobe on 07/22/2019 is no longer visualized. 12 mm medial right lower lobe nodule (4/79), decreased from 15 mm. No pleural fluid. Airway is unremarkable. Musculoskeletal: No worrisome lytic or sclerotic lesions. CT ABDOMEN PELVIS FINDINGS Hepatobiliary: Liver and gallbladder are unremarkable. No biliary ductal dilatation. Pancreas: Negative. Spleen: Negative. Adrenals/Urinary Tract: Adrenal glands are unremarkable. Low-attenuation lesions in the right kidney measure up to 1.5 cm and are likely cysts. Kidneys are  otherwise  unremarkable. Ureters are decompressed. Bladder is low in volume. Stomach/Bowel: Stomach, small bowel, appendix and colon are unremarkable. Vascular/Lymphatic: Atherosclerotic calcification of the aorta without aneurysm. Retroperitoneal lymph nodes have decreased in size in the interval. Index left periaortic lymph node measures 6 mm (2/76), previously 10 mm. Abdominal peritoneal ligament lymph nodes are not enlarged by CT size criteria, similar to the prior exam. Mesenteric lymph nodes have decreased in size. Index lymph node near the root of the small bowel mesentery measures 4 mm (2/66), previously 7 mm. Pelvic retroperitoneal lymph nodes have decreased in size in the interval as well. Index left external iliac lymph node measures 10 mm (2/104), previously 1.6 cm. Inguinal lymph nodes are not enlarged by CT size criteria. Reproductive: Uterus is visualized.  No adnexal mass. Other: Mild laxity of the ventral abdominal wall at the umbilicus. No free fluid. Mesenteries and peritoneum are otherwise unremarkable. Musculoskeletal: No worrisome lytic or sclerotic lesions. Mild L1 compression deformity, unchanged. IMPRESSION: 1. Slight enlargement of a high right paratracheal lymph node. Otherwise, lymph nodes in the axillary regions, abdomen and pelvis have decreased in size in the interval, consistent with treatment response. 2. Interval decrease in size and number of pulmonary nodules, indicative of metastatic disease. 3. There may be mild nonspecific diffuse esophageal wall thickening. 4. Aortic atherosclerosis (ICD10-I70.0). Coronary artery calcification. 5. Enlarged pulmonic trunk, indicative of arterial hypertension. Electronically Signed   By: Lorin Picket M.D.   On: 10/12/2019 13:05   DG Swallowing Func-Speech Pathology  Result Date: 10/14/2019 Objective Swallowing Evaluation: Type of Study: MBS-Modified Barium Swallow Study  Patient Details Name: KASSI SWEETLAND MRN: [id number] Date of Birth:  [birth year] Today's Date: 10/14/2019 Time: SLP Start Time (ACUTE ONLY): 1436 -SLP Stop Time (ACUTE ONLY): 1453 SLP Time Calculation (min) (ACUTE ONLY): 17 min Past Medical History: Past Medical History: Diagnosis Date . Atrial fibrillation ([place])  . Cystic breast  . Depression  . Diffuse cystic mastopathy  . Dyslipidemia (high LDL; low HDL)  . Hypertension  . Iron deficiency anemia, unspecified  . Lymphadenopathy of head and neck 04/05/2015 . Medical non-compliance  . Mitral regurgitation 02/2017  moderate to severe . Persistent atrial fibrillation with rapid ventricular response ([place]) 07/15/2017 Past Surgical History: Past Surgical History: Procedure Laterality Date . CARDIOVERSION N/A 04/02/2017  Procedure: CARDIOVERSION;  Surgeon: Dorothy Spark, MD;  Location: [facility] ENDOSCOPY;  Service: Cardiovascular;  Laterality: N/A; . CARDIOVERSION N/A 08/19/2017  Procedure: CARDIOVERSION;  Surgeon: Larey Dresser, MD;  Location: [place];  Service: Cardiovascular;  Laterality: N/A; . IR IMAGING GUIDED PORT INSERTION  06/29/2018 . TUBAL LIGATION   HPI: LASHIA FREELAND is a 59 y.o. female with chronic lymphocytic leukemia. Per chart saw Dr. Ardis Hughs for dysphagia in 2018 for evaluation of dysphagia. Per chart, chemotherapy has been on hold due to her inability to swallow, odonophagia and seen  week ago for  evaluation of nausea, vomiting. Additionally any thing she eats gets stuck and she subsequently vomits it up. This is not like the dysphagia she had in 2018, it is much worse. Chest CT suggests mild non-specific esophageal wall thickening. Suspect oral / esophageal candida but viral etiologies such as HSV, CMV cannot be excluded. Some mild improvement with Diflucan + Magic mouthwash  No data recorded Assessment / Plan / Recommendation CHL IP CLINICAL IMPRESSIONS 10/14/2019 Clinical Impression Pt exhibits significant labial and facial weakness and reduced ROM. She affirmed she was told she had Bell's palsy however this therapist  unable to find confirmation through  documentation. Despite impairments and oral candidias she was able to functionally masticate, manipulate and transit puree, thin as well as regular texture boluses and denied odonophagia during study. Thin barium was penetrated on two trials however completely exited laryngeal vestibule during the swallow (one was close to cords). Chin tuck prevented but pt stated it hurt her neck and did not wish to perform. Mild residue mid esophagus(MBS does not diagnose below level of UES). Discussed options for diet textures and she stated she would not eat puree and "could order soft food." Recommend Dys 3, thin, crushed pills and allow pt to choose foods. ST will continue to see for tolerance of recommendations.   SLP Visit Diagnosis Dysphagia, pharyngeal phase (R13.13) Attention and concentration deficit following -- Frontal lobe and executive function deficit following -- Impact on safety and function Mild aspiration risk   CHL IP TREATMENT RECOMMENDATION 10/14/2019 Treatment Recommendations Therapy as outlined in treatment plan below   Prognosis 10/14/2019 Prognosis for Safe Diet Advancement Good Barriers to Reach Goals -- Barriers/Prognosis Comment -- CHL IP DIET RECOMMENDATION 10/14/2019 SLP Diet Recommendations Dysphagia 3 (Mech soft) solids;Thin liquid Liquid Administration via Cup;Straw Medication Administration Crushed with puree Compensations Slow rate;Small sips/bites Postural Changes Seated upright at 90 degrees   CHL IP OTHER RECOMMENDATIONS 10/14/2019 Recommended Consults -- Oral Care Recommendations Oral care BID Other Recommendations --   CHL IP FOLLOW UP RECOMMENDATIONS 10/14/2019 Follow up Recommendations Other (comment)   CHL IP FREQUENCY AND DURATION 10/14/2019 Speech Therapy Frequency (ACUTE ONLY) min 2x/week Treatment Duration 2 weeks      CHL IP ORAL PHASE 10/14/2019 Oral Phase WFL Oral - Pudding Teaspoon -- Oral - Pudding Cup -- Oral - Honey Teaspoon -- Oral - Honey Cup -- Oral -  Nectar Teaspoon -- Oral - Nectar Cup -- Oral - Nectar Straw -- Oral - Thin Teaspoon -- Oral - Thin Cup -- Oral - Thin Straw -- Oral - Puree -- Oral - Mech Soft -- Oral - Regular -- Oral - Multi-Consistency -- Oral - Pill -- Oral Phase - Comment --  CHL IP PHARYNGEAL PHASE 10/14/2019 Pharyngeal Phase Impaired Pharyngeal- Pudding Teaspoon -- Pharyngeal -- Pharyngeal- Pudding Cup -- Pharyngeal -- Pharyngeal- Honey Teaspoon -- Pharyngeal -- Pharyngeal- Honey Cup -- Pharyngeal -- Pharyngeal- Nectar Teaspoon -- Pharyngeal -- Pharyngeal- Nectar Cup WFL;Pharyngeal residue - valleculae Pharyngeal -- Pharyngeal- Nectar Straw -- Pharyngeal -- Pharyngeal- Thin Teaspoon -- Pharyngeal -- Pharyngeal- Thin Cup Penetration/Aspiration during swallow Pharyngeal Material enters airway, CONTACTS cords and then ejected out Pharyngeal- Thin Straw WFL Pharyngeal -- Pharyngeal- Puree -- Pharyngeal -- Pharyngeal- Mechanical Soft WFL Pharyngeal -- Pharyngeal- Regular -- Pharyngeal -- Pharyngeal- Multi-consistency -- Pharyngeal -- Pharyngeal- Pill -- Pharyngeal -- Pharyngeal Comment --  CHL IP CERVICAL ESOPHAGEAL PHASE 10/14/2019 Cervical Esophageal Phase WFL Pudding Teaspoon -- Pudding Cup -- Honey Teaspoon -- Honey Cup -- Nectar Teaspoon -- Nectar Cup -- Nectar Straw -- Thin Teaspoon -- Thin Cup -- Thin Straw -- Puree -- Mechanical Soft -- Regular -- Multi-consistency -- Pill -- Cervical Esophageal Comment -- Houston Siren 10/14/2019, 5:03 PM  Orbie Pyo Litaker M.Ed Actor Pager [phone] Office [phone]              Discharge Laboratory Values: Lab Results  Component Value Date   WBC 2.5 (L) 10/17/2019   HGB 10.2 (L) 10/17/2019   HCT 32.5 (L) 10/17/2019   MCV 90.8 10/17/2019   PLT 210 10/17/2019   Lab Results  Component Value Date   NA  142 10/18/2019   K 3.5 10/18/2019   CL 109 10/18/2019   CO2 21 (L) 10/18/2019    Brief H and P: For complete details please refer to admission H and P, but  in brief, she was admitted to the hospital for further evaluation and work-up for severe dysphagia.  Physical Exam at Discharge: BP 137/79 (BP Location: Left Arm)   Pulse 75   Temp 98.2 F (36.8 C) (Oral)   Resp 16   Ht 5\' 7"  (1.702 m)   Wt 189 lb 9.5 oz (86 kg) Comment: 189.2lbs  LMP 02/03/2017 (Approximate)   SpO2 98%   BMI 29.69 kg/m  GENERAL:alert, no distress and comfortable SKIN: skin color, texture, turgor are normal, no rashes or significant lesions EYES: normal, Conjunctiva are pink and non-injected, sclera clear OROPHARYNX:no exudate, no erythema and lips, buccal mucosa, and tongue normal  NECK: supple, thyroid normal size, non-tender, without nodularity LYMPH:  no palpable lymphadenopathy in the cervical, axillary or inguinal LUNGS: clear to auscultation and percussion with normal breathing effort HEART: regular rate & rhythm and no murmurs and no lower extremity edema ABDOMEN:abdomen soft, non-tender and normal bowel sounds Musculoskeletal:no cyanosis of digits and no clubbing  NEURO: alert & oriented x 3 with fluent speech, no focal motor/sensory deficits  Hospital Course:  Active Problems:   CLL (chronic lymphocytic leukemia) ([facility])   Dysphagia   Dehydration   Acute esophagitis   Gastritis without bleeding   Duodenal nodule  Chronic lymphocytic leukemia Overall, she tolerated chemotherapy well She had positive response to treatment The small lymphadenopathy seen in her CT scan of the chest is not significant enough to have caused her symptoms She has not been able to take chemotherapy due to inability to swallow At the time of discharge, her dysphagia has improved.  She will resume chemotherapy tomorrow  Severe dysphagia, resolving She had near 0 oral intake of food and water over the week prior to admission still unable to take in food and water She could not swallow any of her pills for over a week The patient was grossly dehydrated and oliguric prior to  admission despite 2 L of IV fluids.  GI has been consulted and recommends trial of Magic mouthwash with lidocaine and IV fluconazole EGD today show severe esophagitis, biopsies show no evidence of malignancy. Speech therapy has been consulted and recommends dysphagia 3 diet with thin liquids She is able to eat better on the day of discharge.  Severe hypertension, resolving She will resume home medications  Severe hypokalemia, resolved  Mild pancytopenia, stable This is due to her recent treatment She is not symptomatic Observe for now  Severe constipation, resolved MiraLAX and Senokot have been ordered  Nausea and vomiting, resolved This predates her hospital admission Continue as needed antiemetics  Severe esophagitis Biopsy showed no evidence of malignancy She is on high-dose pantoprazole and  Carafate  Covid testing She is not symptomatic Her recent Covid testing on October 07, 2019 is negative Repeat Covid testing from 10/13/2019 is negative  Diet: Dysphagia diet, advance to full regular diet as tolerated  Activity:  As tolerated  Condition at Discharge:   stable  Signed: Dr. Heath Lark [phone]  10/19/2019, 12:04 PM  Spent 35 minutes on discharge orders and review test results with the patient

## 2019-10-19 NOTE — Progress Notes (Signed)
Reviewed discharge paperwork, medication regimen, & follow up appointments with patient. Patient discharged by wheelchair by NT.

## 2019-10-19 NOTE — Progress Notes (Addendum)
Patient ID: Audrey Gross, female   DOB: 09-18-1961, 59 y.o.   MRN: [id number]    Progress Note   Subjective  Day # 5  CC; severe dysphagia and odynophagia  Path still pending-called path this a.m., asked them to expedite so should be available soon.  Patient sitting up on the side of the bed, eating cereal.  In good spirits.  She admits that she is feeling a bit better and not having as much odynophagia decreased mucus sensation in her throat and no further regurgitation or vomiting.  Twice daily IV PPI Carafate IV Diflucan   Objective   Vital signs in last 24 hours: Temp:  [98.2 F (36.8 C)-99.2 F (37.3 C)] 98.2 F (36.8 C) (01/13 0457) Pulse Rate:  [75-77] 75 (01/13 0457) Resp:  [15-16] 16 (01/13 0457) BP: (137-157)/(79-87) 137/79 (01/13 0457) SpO2:  [96 %-100 %] 98 % (01/13 0457) Last BM Date: 10/15/19 General:    African-American female in NAD Heart:  Regular rate and rhythm; no murmurs Lungs: Respirations even and unlabored, lungs CTA bilaterally Abdomen:  Soft, nontender and nondistended. Normal bowel sounds. Extremities:  Without edema. Neurologic:  Alert and oriented,  grossly normal neurologically. Psych:  Cooperative. Normal mood and affect.  Intake/Output from previous day: 01/12 0701 - 01/13 0700 In: 480 [P.O.:480] Out: 2450 [Urine:2450] Intake/Output this shift: No intake/output data recorded.  Lab Results: Recent Labs    10/17/19 0431  WBC 2.5*  HGB 10.2*  HCT 32.5*  PLT 210   BMET Recent Labs    10/17/19 0431 10/18/19 0435  NA 145 142  K 3.4* 3.5  CL 114* 109  CO2 21* 21*  GLUCOSE 89 87  BUN 5* 6  CREATININE 0.60 0.60  CALCIUM 8.6* 8.7*   LFT No results for input(s): PROT, ALBUMIN, AST, ALT, ALKPHOS, BILITOT, BILIDIR, IBILI in the last 72 hours. PT/INR No results for input(s): LABPROT, INR in the last 72 hours.      Assessment / Plan:    #58 59 year old African-American female with CLL undergoing chemotherapy, admitted with  severe dysphagia and odynophagia, and found to have severe grade D diffuse esophagitis, no obvious candidiasis.  Also with diffuse gastritis. Rule out chemo induced, rule out viral  She has had some gradual improvement over the past couple of days, and is trying to eat.  Esophageal gastric and duodenal biopsies pending, should be out today.  Plan; continue current regimen of IV PPI twice daily Carafate suspension 4 times daily between meals and bedtime IV Diflucan-DC if no candidiasis noted on path Further regimen changes pending path results.   Addendum; path has resulted-esophageal biopsies showed acute ulcerated mucosa, no fungal organisms and stains for CMV and HSV are negative Gastric biopsies-acute gastritis no H. Pylori Duodenal biopsy peptic duodenitis.  Will DC IV Diflucan. Continue current regimen otherwise.       Active Problems:   CLL (chronic lymphocytic leukemia) ([facility])   Dysphagia   Dehydration   Acute esophagitis   Gastritis without bleeding   Duodenal nodule     LOS: 6 days   Amy Esterwood PA-C 10/19/2019, 9:07 AM  GI ATTENDING  Interval history data reviewed.  Patient seen and examined.  Agree with interval progress note.  Patient symptoms continue to improve.  Biopsies show acute esophagitis but no evidence of viral or fungal esophagitis.  Continue with current treatment regimens.  No further GI recommendations.  Please call for questions or problems.  We will sign off.  Docia Chuck. Henrene Pastor,  Brooke Bonito., M.D. [facility] Division of Gastroenterology

## 2019-10-20 ENCOUNTER — Telehealth: Payer: Self-pay | Admitting: *Deleted

## 2019-10-20 ENCOUNTER — Telehealth: Payer: Self-pay | Admitting: Hematology and Oncology

## 2019-10-20 NOTE — Telephone Encounter (Signed)
Scheduled per 1/13 sch msg. Called and spoke with pt, confirmed 1/21 appt

## 2019-10-20 NOTE — Telephone Encounter (Signed)
Patient called because she was not able to get the Sucralfate filled yesterday. PA submitted today. Insurance states patient must try Sucralfate tablets first. PA submitted states patient is not able to swallow tablets. Sent to her plan for review.

## 2019-10-21 ENCOUNTER — Other Ambulatory Visit: Payer: Self-pay | Admitting: Hematology and Oncology

## 2019-10-21 ENCOUNTER — Telehealth: Payer: Self-pay | Admitting: *Deleted

## 2019-10-21 MED ORDER — SUCRALFATE 1 G PO TABS: 1.0000 g | ORAL_TABLET | Freq: Three times a day (TID) | ORAL | 1 refills | Status: DC

## 2019-10-21 NOTE — Telephone Encounter (Signed)
Insurance will not cover the Carafate Suspension but will cover tablets. Patient contacted by Probation officer. She is able to swallow tablets now. She would like this prescription changed to tablets so she does not have to pay so much out of pocket.

## 2019-10-21 NOTE — Telephone Encounter (Signed)
Done

## 2019-10-25 ENCOUNTER — Other Ambulatory Visit: Payer: Self-pay | Admitting: Hematology and Oncology

## 2019-10-27 ENCOUNTER — Encounter: Payer: Self-pay | Admitting: Hematology and Oncology

## 2019-10-27 ENCOUNTER — Other Ambulatory Visit: Payer: Self-pay

## 2019-10-27 ENCOUNTER — Inpatient Hospital Stay: Payer: BC Managed Care – PPO | Admitting: Hematology and Oncology

## 2019-10-27 ENCOUNTER — Telehealth: Payer: Self-pay | Admitting: *Deleted

## 2019-10-27 ENCOUNTER — Inpatient Hospital Stay: Payer: BC Managed Care – PPO

## 2019-10-27 DIAGNOSIS — E46 Unspecified protein-calorie malnutrition: Secondary | ICD-10-CM | POA: Diagnosis not present

## 2019-10-27 DIAGNOSIS — K209 Esophagitis, unspecified without bleeding: Secondary | ICD-10-CM | POA: Diagnosis not present

## 2019-10-27 DIAGNOSIS — R1310 Dysphagia, unspecified: Secondary | ICD-10-CM | POA: Diagnosis not present

## 2019-10-27 DIAGNOSIS — R1319 Other dysphagia: Secondary | ICD-10-CM

## 2019-10-27 DIAGNOSIS — R5381 Other malaise: Secondary | ICD-10-CM

## 2019-10-27 DIAGNOSIS — C9112 Chronic lymphocytic leukemia of B-cell type in relapse: Secondary | ICD-10-CM | POA: Diagnosis not present

## 2019-10-27 DIAGNOSIS — Z79899 Other long term (current) drug therapy: Secondary | ICD-10-CM | POA: Diagnosis not present

## 2019-10-27 DIAGNOSIS — I10 Essential (primary) hypertension: Secondary | ICD-10-CM

## 2019-10-27 DIAGNOSIS — D61818 Other pancytopenia: Secondary | ICD-10-CM | POA: Diagnosis not present

## 2019-10-27 DIAGNOSIS — C9110 Chronic lymphocytic leukemia of B-cell type not having achieved remission: Secondary | ICD-10-CM | POA: Diagnosis not present

## 2019-10-27 DIAGNOSIS — E876 Hypokalemia: Secondary | ICD-10-CM | POA: Diagnosis not present

## 2019-10-27 LAB — COMPREHENSIVE METABOLIC PANEL
ALT: 10 U/L (ref 0–44)
AST: 18 U/L (ref 15–41)
Albumin: 2.6 g/dL — ABNORMAL LOW (ref 3.5–5.0)
Alkaline Phosphatase: 36 U/L — ABNORMAL LOW (ref 38–126)
Anion gap: 6 (ref 5–15)
BUN: 7 mg/dL (ref 6–20)
CO2: 21 mmol/L — ABNORMAL LOW (ref 22–32)
Calcium: 6.5 mg/dL — ABNORMAL LOW (ref 8.9–10.3)
Chloride: 119 mmol/L — ABNORMAL HIGH (ref 98–111)
Creatinine, Ser: 0.54 mg/dL (ref 0.44–1.00)
GFR calc Af Amer: >60 mL/min (ref >60)
GFR calc non Af Amer: >60 mL/min (ref >60)
Glucose, Bld: 84 mg/dL (ref 70–99)
Potassium: 2.6 mmol/L — CL (ref 3.5–5.1)
Sodium: 146 mmol/L — ABNORMAL HIGH (ref 135–145)
Total Bilirubin: 0.2 mg/dL — ABNORMAL LOW (ref 0.3–1.2)
Total Protein: 4.4 g/dL — ABNORMAL LOW (ref 6.5–8.1)

## 2019-10-27 LAB — CBC WITH DIFFERENTIAL/PLATELET
Abs Immature Granulocytes: 0.11 10*3/uL — ABNORMAL HIGH (ref 0.00–0.07)
Basophils Absolute: 0.1 10*3/uL (ref 0.0–0.1)
Basophils Relative: 3 %
Eosinophils Absolute: 0.1 10*3/uL (ref 0.0–0.5)
Eosinophils Relative: 5 %
HCT: 35 % — ABNORMAL LOW (ref 36.0–46.0)
Hemoglobin: 11.2 g/dL — ABNORMAL LOW (ref 12.0–15.0)
Immature Granulocytes: 4 %
Lymphocytes Relative: 56 %
Lymphs Abs: 1.5 10*3/uL (ref 0.7–4.0)
MCH: 28.8 pg (ref 26.0–34.0)
MCHC: 32 g/dL (ref 30.0–36.0)
MCV: 90 fL (ref 80.0–100.0)
Monocytes Absolute: 0.1 10*3/uL (ref 0.1–1.0)
Monocytes Relative: 4 %
Neutro Abs: 0.7 10*3/uL — ABNORMAL LOW (ref 1.7–7.7)
Neutrophils Relative %: 28 %
Platelets: 200 10*3/uL (ref 150–400)
RBC: 3.89 MIL/uL (ref 3.87–5.11)
RDW: 13.2 % (ref 11.5–15.5)
WBC: 2.7 10*3/uL — ABNORMAL LOW (ref 4.0–10.5)
nRBC: 0 % (ref 0.0–0.2)

## 2019-10-27 NOTE — Telephone Encounter (Signed)
Telephone call to patient to discuss lab result as directed below. Patient reports she has the potassium rich diet info however up until recently she has not been able to eat much of anything at all. She will begin using this as a guide to preparing her foods.

## 2019-10-27 NOTE — Assessment & Plan Note (Signed)
This is likely due to recent treatment. The patient denies recent history of fevers, cough, chills, diarrhea or dysuria. She is asymptomatic from the leukopenia. I will observe for now.  We discussed neutropenic precaution

## 2019-10-27 NOTE — Progress Notes (Signed)
[facility] OFFICE PROGRESS NOTE  Patient Care Team: Janith Lima, MD as PCP - General (Internal Medicine) Constance Haw, MD as PCP - Electrophysiology (Cardiology) Constance Haw, MD as Consulting Physician (Cardiology)  ASSESSMENT & PLAN:  CLL (chronic lymphocytic leukemia) ([place]) Dysphagia has improved Recent CT imaging showed positive response to therapy She will resume taking chemotherapy and I plan to see her again next month for further follow-up  Pancytopenia, acquired [facility]) This is likely due to recent treatment. The patient denies recent history of fevers, cough, chills, diarrhea or dysuria. She is asymptomatic from the leukopenia. I will observe for now.  We discussed neutropenic precaution  Hypertension Her blood pressure is elevated likely due to anxiety She will resume taking her blood pressure medications  Dysphagia Her recent dysphagia was related to severe esophagitis seen on EGD She will continue taking Carafate and proton pump inhibitor  Hypokalemia This is due to poor oral intake Due to difficulties with swallowing, I recommend she focus on potassium rich diet then oral potassium replacement  Physical debility She has lost a lot of weight and is malnourished due to recent dysphagia She is weak I recommend her not to return back to work until next week to allow more time to recover at home I will fill out her disability paperwork   No orders of the defined types were placed in this encounter.   All questions were answered. The patient knows to call the clinic with any problems, questions or concerns. The total time spent in the appointment was 30 minutes encounter with patients including review of chart and various tests results, discussions about plan of care and coordination of care plan   Heath Lark, MD 10/27/2019 11:20 AM  INTERVAL HISTORY: Please see below for problem oriented charting. She returns for post hospital  follow-up She felt better since last time I saw her She is eating better and was able to swallow some pills No recent fever or chills No new lymphadenopathy Denies recent chest pain or shortness of breath The patient denies any recent signs or symptoms of bleeding such as spontaneous epistaxis, hematuria or hematochezia. She is still weak and unable to return back to work right now  SUMMARY OF ONCOLOGIC HISTORY: Oncology History Overview Note  17p positive   CLL (chronic lymphocytic leukemia) ([place])  04/05/2015 Pathology Results   Accession: [id number] flow cytometry confirmed CLL. FISH was positive for p53 mutation   04/24/2015 Imaging   Extensive lymphadenopathy throughout the neck, chest (axilla), abdomen and pelvis, as detailed above, compatible with the reported clinical history of lymphoma. 2. Mild splenomegaly.   05/03/2015 - 08/27/2015 Chemotherapy   She started on Ibrutinib, discontinued prematurely when her prescription ran out   10/10/2015 - 05/29/2017 Chemotherapy   She was restarted back on Ibrutinib   11/13/2016 PET scan   Significant generalized reduction in size of numerous lymph nodes in the neck, chest, abdomen, and pelvis. Previously the activity of these nodes was low-level and in general a similar low-level activity is present today, significantly less than mediastinal blood pool activity, compatible with Deauville 2. 2. Coronary atherosclerosis. Mild cardiomegaly. 3. Mildly prominent endometrium for age without accentuated metabolic activity in the endometrium. Consider pelvic sonography for further characterization.   05/27/2017 PET scan   1. Progressive hypermetabolic adenopathy, primarily involving cervical, axillary, pelvic and inguinal lymph nodes bilaterally, consistent with progressive lymphoma. 2. No solid visceral organ or osseous involvement.   06/16/2017 - 08/25/2017 Chemotherapy  The patient had 3 cycles of Rituximab and Bendamustine   07/15/2017 -  07/19/2017 Hospital Admission   The patient was briefly admitted to the hospital due to infusion reaction to rituximab   09/18/2017 PET scan   1. Continued considerable adenopathy in the neck, chest, abdomen, and pelvis. This is generally stable in size but mildly reduced in activity compared to the prior exam. Current levels of activity primarily Deauville 3 and Deauville 4. No splenomegaly. 2. Diffuse new ground-glass opacities in the lungs with associated hypermetabolic activity. Some forms of lymphoma infiltration can rarely cause this pattern of diffuse ground-glass opacity and hypermetabolic activity. Differential diagnostic considerations might include atypical pneumonia such as mycoplasma, acute hypersensitivity pneumonitis, or acute eosinophilic pneumonia. Pulmonary hemorrhage seems less likely to cause this degree of accentuated metabolic activity.  3. Aortic Atherosclerosis (ICD10-I70.0). Coronary atherosclerosis.   09/24/2017 -  Chemotherapy   The patient had ventoclax for chemotherapy treatment.  Rituximab is added on 11/18/17 to 04/09/18, x 6 cycles   11/19/2017 PET scan   Overall mild interval decrease in hypermetabolic lymphadenopathy throughout the neck, chest, abdomen, and pelvis. No new or increased lymphadenopathy identified.  While there has been resolution of diffuse hypermetabolic bilateral ground-glass pulmonary opacity since prior study, there is a new 14 mm hypermetabolic pulmonary nodule in the posterior right lower lobe. Time course favors inflammatory or infectious etiology over neoplasm. Recommend continued follow-up by chest CT in 3 months.   02/16/2018 PET scan   1. Adenopathy in the neck, chest, and pelvis is stable to minimally reduced in size, and is moderately reduced in activity, primarily Deauville 2 disease today. There is a right common iliac lymph node qualifying as Deauville 3 disease which is stable in size but reduced in activity. 2. Stable size but reduced  activity in a pulmonary nodule in the right lower lobe. If this represents a leukemic lesion then a corresponds to Deauville 4 disease. This lesion was not readily apparent on 09/22/2017 but was reported on the prior chest CT of 11/19/2017. 3. Other imaging findings of potential clinical significance: Mild thyroid goiter. Aortic Atherosclerosis (ICD10-I70.0). Coronary atherosclerosis. Mild cardiomegaly.   04/19/2018 PET scan   1. Interval mild mixed metabolic changes, as detailed. Persistent mildly hypermetabolic bilateral axillary, mediastinal and bilateral pelvic adenopathy and mildly hypermetabolic right lower lobe pulmonary nodule compatible with lymphoproliferative disorder. Deauville 4 based on the subcarinal node. 2. Aortic Atherosclerosis (ICD10-I70.0).   06/29/2018 Procedure   Successful placement of a right internal jugular approach power injectable Port-A-Cath. The catheter is ready for immediate use.   07/26/2018 PET scan   1. Adenopathy primarily in the chest and pelvis as noted above, with size of the mildly enlarged lymph nodes stable to minimally increased, but with nodal activity generally decreased compared to the prior exam. Uptake in these nodes is primarily Deauville 2. 2. Aortic Atherosclerosis (ICD10-I70.0). Coronary atherosclerosis with mild cardiomegaly.   10/29/2018 PET scan   1. Relatively stable sized lymphadenopathy but interval increase in hypermetabolism when compared to the most recent prior PET-CT, as detailed above. 2. No new disease is identified. 3. Stable bilateral pulmonary nodules.   11/12/2018 - 02/02/2019 Chemotherapy   The patient had riTUXimab (RITUXAN) 800 mg in sodium chloride 0.9 % 170 mL infusion, 375 mg/m2 = 800 mg, Intravenous,  Once, 3 of 4 cycles Administration: 800 mg (11/12/2018), 800 mg (12/10/2018), 800 mg (01/06/2019)  for chemotherapy treatment.    01/11/2019 Imaging   1. Significant increased adenopathy in the chest, abdomen,  and pelvis compared  to the 10/28/2018 PET-CT. 2. The scattered tiny pulmonary nodules and larger single right lower lobe pulmonary nodule are all stable. 3. Other imaging findings of potential clinical significance: Aortic Atherosclerosis (ICD10-I70.0). Coronary atherosclerosis. Mild cardiomegaly. Mild diffuse thyroid prominence, stable. Mild left foraminal impingement at L4-5.   01/21/2019 Procedure   Successful ultrasound-guided core biopsies of an enlarged right axillary lymph node.    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 The latter is important because it essentially rules out the possibility of mantle cell lymphoma. CD3 highlights a small population of T-cells. By flow cytometry, a kappa-restricted monoclonal B-cell population that expresses CD19, CD20, CD5, and CD23 comprises 89% of all lymphocytes. Overall, the features are consistent with chronic lymphocytic leukemia/small lymphocytic lymphoma.   01/31/2019 Echocardiogram    1. The left ventricle has normal systolic function, with an ejection fraction of 55-60%. The cavity size was normal. Left ventricular diastolic Doppler parameters are consistent with impaired relaxation. No evidence of left ventricular regional wall motion abnormalities. GLS -20%.  2. The right ventricle has normal systolic function. The cavity was normal. There is no increase in right ventricular wall thickness.  3. The aortic valve is tricuspid. Mild calcification of the aortic valve. No stenosis of the aortic valve.  4. The aortic root is normal in size and structure.  5. 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  6. No evidence of mitral valve  stenosis. No significant mitral regurgitation.  7. Normal IVC size. No complete TR doppler jet so unable to estimate PA systolic pressure.   02/03/2019 - 07/24/2019 Chemotherapy   The patient had obinutuzumab and Acalabrutinib for chemotherapy treatment.    05/02/2019 Imaging   1. Interval decrease in size axillary and mediastinal adenopathy. 2. There are a few retroperitoneal lymph nodes which are mildly increased in size. The majority of the retroperitoneal and pelvic lymph nodes are grossly similar when compared to prior exam. 3. Stable scattered pulmonary nodules.   07/22/2019 Imaging   1. Progressive disease within the neck, chest, abdomen, and pelvis, as evidenced by increase in adenopathy. 2. Similar bilateral pulmonary nodules. 3. Extensive right-sided colonic pneumatosis and small volume extracolonic intraperitoneal gas. Correlate with abdominal symptoms. Of note, pneumatosis has been described in patients on immunotherapy. 4. Age advanced coronary artery atherosclerosis. Recommend assessment of coronary risk factors and consideration of medical therapy. 5.  Aortic Atherosclerosis (ICD10-I70.0). 6. Similar mild L1 compression deformity. 7. Trace free pelvic fluid, similar. 8. Pulmonary artery enlargement suggests pulmonary arterial hypertension.   08/11/2019 -  Chemotherapy   The patient had duvelisib for chemotherapy treatment.     10/12/2019 Imaging   1. Slight enlargement of a high right paratracheal lymph node. Otherwise, lymph nodes in the axillary regions, abdomen and pelvis have decreased in size in the interval, consistent with treatment response. 2. Interval decrease in size and number of pulmonary nodules, indicative of metastatic disease. 3. There may be mild nonspecific diffuse esophageal wall thickening. 4. Aortic atherosclerosis (ICD10-I70.0). Coronary artery calcification. 5. Enlarged pulmonic trunk, indicative of arterial hypertension.   10/13/2019 -  Hospital  Admission   She was admitted for severe dysphagia, dehydration and electrolytes abnormalities     REVIEW OF SYSTEMS:   Constitutional:  Denies fevers, chills or abnormal weight loss Eyes: Denies blurriness of vision Ears, nose, mouth, throat, and face: Denies mucositis or sore throat Respiratory: Denies cough, dyspnea or wheezes Cardiovascular: Denies palpitation, chest discomfort or lower extremity swelling Gastrointestinal:  Denies nausea, heartburn or change in bowel habits Skin: Denies abnormal skin rashes Lymphatics: Denies new lymphadenopathy or easy bruising Behavioral/Psych: Mood is stable, no new changes  All other systems were reviewed with the patient and are negative.  I have reviewed the past medical history, past surgical history, social history and family history with the patient and they are unchanged from previous note.  ALLERGIES:  has No Known Allergies.  MEDICATIONS:  Current Outpatient Medications  Medication Sig Dispense Refill  . Cholecalciferol (VITAMIN D-3) 1000 units CAPS Take 1,000 Units by mouth daily with breakfast.    . Duvelisib (COPIKTRA) 25 MG CAPS Take 25 mg by mouth 2 (two) times daily. Take with or without food, approximately 12 hours apart. 60 capsule 11  . ELIQUIS 5 MG TABS tablet TAKE 1 TABLET BY MOUTH TWICE A DAY 180 tablet 1  . lidocaine-prilocaine (EMLA) cream Apply 1 application topically as needed. (Patient taking differently: Apply 1 application topically as needed (acess port). ) 30 g 6  . metoprolol succinate (TOPROL-XL) 100 MG 24 hr tablet TAKE 1 AND 1/2 TABLETS BY MOUTH EVERY DAY. TAKE WITH FOOD OR RIGHT AFTER A MEAL 135 tablet 2  . ondansetron (ZOFRAN-ODT) 8 MG disintegrating tablet Take 1 tablet (8 mg total) by mouth every 8 (eight) hours as needed for nausea or vomiting. 20 tablet 0  . pantoprazole (PROTONIX) 40 MG tablet Take 1 tablet (40 mg total) by mouth 2 (two) times daily. 60 tablet 3  . sacubitril-valsartan (ENTRESTO) 49-51 MG  Take 1 tablet by mouth 2 (two) times daily. 60 tablet 3  . sucralfate (CARAFATE) 1 g tablet Take 1 tablet (1 g total) by mouth 3 (three) times daily. 90 tablet 1  . sulfamethoxazole-trimethoprim (BACTRIM) 400-80 MG tablet Take 1 tablet by mouth daily. 30 tablet 6  . valGANciclovir (VALCYTE) 450 MG tablet Take 2 tablets (900 mg total) by mouth daily. 60 tablet 6   No current facility-administered medications for this visit.    PHYSICAL EXAMINATION: ECOG PERFORMANCE STATUS: 1 - Symptomatic but completely ambulatory  Vitals:   10/27/19 0946  BP: (!) 168/81  Pulse: (!) 54  Resp: 18  Temp: 98.7 F (37.1 C)  SpO2: 100%   Filed Weights   10/27/19 0946  Weight: 189 lb 9.6 oz (86 kg)    GENERAL:alert, no distress and comfortable SKIN: skin color, texture, turgor are normal, no rashes or significant lesions EYES: normal, Conjunctiva are pink and non-injected, sclera clear OROPHARYNX:no exudate, no erythema and lips, buccal mucosa, and tongue normal  NECK: supple, thyroid normal size, non-tender, without nodularity LYMPH:  no palpable lymphadenopathy in the cervical, axillary or inguinal LUNGS: clear to auscultation and percussion with normal breathing effort HEART: regular rate & rhythm and no murmurs and no lower extremity edema ABDOMEN:abdomen soft, non-tender and normal bowel sounds Musculoskeletal:no cyanosis of digits and no clubbing  NEURO: alert & oriented x 3 with fluent speech, no focal motor/sensory deficits  LABORATORY DATA:  I have reviewed the data as listed    Component Value Date/Time   NA 146 (H) 10/27/2019 0835   NA 140 10/09/2017 1416   K 2.6 (LL) 10/27/2019 0835   K 4.4 10/09/2017 1416   CL 119 (H) 10/27/2019 0835   CL 105  01/18/2013 0912   CO2 21 (L) 10/27/2019 0835   CO2 25 10/09/2017 1416   GLUCOSE 84 10/27/2019 0835   GLUCOSE 117 10/09/2017 1416   GLUCOSE 83 01/18/2013 0912   BUN 7 10/27/2019 0835   BUN 11.1 10/09/2017 1416   CREATININE 0.54  10/27/2019 0835   CREATININE 0.72 11/01/2018 0903   CREATININE 0.8 10/09/2017 1416   CALCIUM 6.5 (L) 10/27/2019 0835   CALCIUM 9.6 10/09/2017 1416   PROT 4.4 (L) 10/27/2019 0835   PROT 6.4 10/09/2017 1416   ALBUMIN 2.6 (L) 10/27/2019 0835   ALBUMIN 3.4 (L) 10/09/2017 1416   AST 18 10/27/2019 0835   AST 17 11/01/2018 0903   AST 18 10/09/2017 1416   ALT 10 10/27/2019 0835   ALT 15 11/01/2018 0903   ALT 19 10/09/2017 1416   ALKPHOS 36 (L) 10/27/2019 0835   ALKPHOS 69 10/09/2017 1416   BILITOT 0.2 (L) 10/27/2019 0835   BILITOT 1.3 (H) 11/01/2018 0903   BILITOT 0.57 10/09/2017 1416   GFRNONAA >60 10/27/2019 0835   GFRNONAA >60 11/01/2018 0903   GFRAA >60 10/27/2019 0835   GFRAA >60 11/01/2018 0903    No results found for: SPEP, UPEP  Lab Results  Component Value Date   WBC 2.7 (L) 10/27/2019   NEUTROABS 0.7 (L) 10/27/2019   HGB 11.2 (L) 10/27/2019   HCT 35.0 (L) 10/27/2019   MCV 90.0 10/27/2019   PLT 200 10/27/2019      Chemistry      Component Value Date/Time   NA 146 (H) 10/27/2019 0835   NA 140 10/09/2017 1416   K 2.6 (LL) 10/27/2019 0835   K 4.4 10/09/2017 1416   CL 119 (H) 10/27/2019 0835   CL 105 01/18/2013 0912   CO2 21 (L) 10/27/2019 0835   CO2 25 10/09/2017 1416   BUN 7 10/27/2019 0835   BUN 11.1 10/09/2017 1416   CREATININE 0.54 10/27/2019 0835   CREATININE 0.72 11/01/2018 0903   CREATININE 0.8 10/09/2017 1416      Component Value Date/Time   CALCIUM 6.5 (L) 10/27/2019 0835   CALCIUM 9.6 10/09/2017 1416   ALKPHOS 36 (L) 10/27/2019 0835   ALKPHOS 69 10/09/2017 1416   AST 18 10/27/2019 0835   AST 17 11/01/2018 0903   AST 18 10/09/2017 1416   ALT 10 10/27/2019 0835   ALT 15 11/01/2018 0903   ALT 19 10/09/2017 1416   BILITOT 0.2 (L) 10/27/2019 0835   BILITOT 1.3 (H) 11/01/2018 0903   BILITOT 0.57 10/09/2017 1416       RADIOGRAPHIC STUDIES: I have personally reviewed the radiological images as listed and agreed with the findings in the  report. CT Chest W Contrast  Result Date: 10/12/2019 CLINICAL DATA:  CLL. New dysphagia. Ongoing chemotherapy pill. Cough with vomiting. EXAM: CT CHEST, ABDOMEN, AND PELVIS WITH CONTRAST TECHNIQUE: Multidetector CT imaging of the chest, abdomen and pelvis was performed following the standard protocol during bolus administration of intravenous contrast. CONTRAST:  154m OMNIPAQUE IOHEXOL 300 MG/ML  SOLN COMPARISON:  07/22/2019. FINDINGS: CT CHEST FINDINGS Cardiovascular: Right IJ Port-A-Cath terminates in the high right atrium. Atherosclerotic calcification of the aorta and coronary arteries. Pulmonic trunk is enlarged. Heart is at the upper limits of normal in size to mildly enlarged. No pericardial effusion. Mediastinum/Nodes: High right paratracheal lymph node is enlarged slightly now measuring 7 mm (2/12) previously 4 mm. No additional pathologically enlarged mediastinal or hilar lymph nodes. Subpectoral and axillary lymph nodes have decreased in size in the  interval. Index left axillary lymph node measures 11 mm (2/10), compared to 1.9 cm on 07/22/2019. There may be mild diffuse esophageal wall thickening. Lungs/Pleura: Minimal biapical pleuroparenchymal scarring. Decrease in size and number of bilateral pulmonary nodules. For example, a 3 mm nodule seen in the anterior segment right upper lobe on 07/22/2019 is no longer visualized. 12 mm medial right lower lobe nodule (4/79), decreased from 15 mm. No pleural fluid. Airway is unremarkable. Musculoskeletal: No worrisome lytic or sclerotic lesions. CT ABDOMEN PELVIS FINDINGS Hepatobiliary: Liver and gallbladder are unremarkable. No biliary ductal dilatation. Pancreas: Negative. Spleen: Negative. Adrenals/Urinary Tract: Adrenal glands are unremarkable. Low-attenuation lesions in the right kidney measure up to 1.5 cm and are likely cysts. Kidneys are otherwise unremarkable. Ureters are decompressed. Bladder is low in volume. Stomach/Bowel: Stomach, small bowel,  appendix and colon are unremarkable. Vascular/Lymphatic: Atherosclerotic calcification of the aorta without aneurysm. Retroperitoneal lymph nodes have decreased in size in the interval. Index left periaortic lymph node measures 6 mm (2/76), previously 10 mm. Abdominal peritoneal ligament lymph nodes are not enlarged by CT size criteria, similar to the prior exam. Mesenteric lymph nodes have decreased in size. Index lymph node near the root of the small bowel mesentery measures 4 mm (2/66), previously 7 mm. Pelvic retroperitoneal lymph nodes have decreased in size in the interval as well. Index left external iliac lymph node measures 10 mm (2/104), previously 1.6 cm. Inguinal lymph nodes are not enlarged by CT size criteria. Reproductive: Uterus is visualized.  No adnexal mass. Other: Mild laxity of the ventral abdominal wall at the umbilicus. No free fluid. Mesenteries and peritoneum are otherwise unremarkable. Musculoskeletal: No worrisome lytic or sclerotic lesions. Mild L1 compression deformity, unchanged. IMPRESSION: 1. Slight enlargement of a high right paratracheal lymph node. Otherwise, lymph nodes in the axillary regions, abdomen and pelvis have decreased in size in the interval, consistent with treatment response. 2. Interval decrease in size and number of pulmonary nodules, indicative of metastatic disease. 3. There may be mild nonspecific diffuse esophageal wall thickening. 4. Aortic atherosclerosis (ICD10-I70.0). Coronary artery calcification. 5. Enlarged pulmonic trunk, indicative of arterial hypertension. Electronically Signed   By: Lorin Picket M.D.   On: 10/12/2019 13:05   CT Abdomen Pelvis W Contrast  Result Date: 10/12/2019 CLINICAL DATA:  CLL. New dysphagia. Ongoing chemotherapy pill. Cough with vomiting. EXAM: CT CHEST, ABDOMEN, AND PELVIS WITH CONTRAST TECHNIQUE: Multidetector CT imaging of the chest, abdomen and pelvis was performed following the standard protocol during bolus  administration of intravenous contrast. CONTRAST:  129m OMNIPAQUE IOHEXOL 300 MG/ML  SOLN COMPARISON:  07/22/2019. FINDINGS: CT CHEST FINDINGS Cardiovascular: Right IJ Port-A-Cath terminates in the high right atrium. Atherosclerotic calcification of the aorta and coronary arteries. Pulmonic trunk is enlarged. Heart is at the upper limits of normal in size to mildly enlarged. No pericardial effusion. Mediastinum/Nodes: High right paratracheal lymph node is enlarged slightly now measuring 7 mm (2/12) previously 4 mm. No additional pathologically enlarged mediastinal or hilar lymph nodes. Subpectoral and axillary lymph nodes have decreased in size in the interval. Index left axillary lymph node measures 11 mm (2/10), compared to 1.9 cm on 07/22/2019. There may be mild diffuse esophageal wall thickening. Lungs/Pleura: Minimal biapical pleuroparenchymal scarring. Decrease in size and number of bilateral pulmonary nodules. For example, a 3 mm nodule seen in the anterior segment right upper lobe on 07/22/2019 is no longer visualized. 12 mm medial right lower lobe nodule (4/79), decreased from 15 mm. No pleural fluid. Airway is unremarkable. Musculoskeletal: No  worrisome lytic or sclerotic lesions. CT ABDOMEN PELVIS FINDINGS Hepatobiliary: Liver and gallbladder are unremarkable. No biliary ductal dilatation. Pancreas: Negative. Spleen: Negative. Adrenals/Urinary Tract: Adrenal glands are unremarkable. Low-attenuation lesions in the right kidney measure up to 1.5 cm and are likely cysts. Kidneys are otherwise unremarkable. Ureters are decompressed. Bladder is low in volume. Stomach/Bowel: Stomach, small bowel, appendix and colon are unremarkable. Vascular/Lymphatic: Atherosclerotic calcification of the aorta without aneurysm. Retroperitoneal lymph nodes have decreased in size in the interval. Index left periaortic lymph node measures 6 mm (2/76), previously 10 mm. Abdominal peritoneal ligament lymph nodes are not enlarged  by CT size criteria, similar to the prior exam. Mesenteric lymph nodes have decreased in size. Index lymph node near the root of the small bowel mesentery measures 4 mm (2/66), previously 7 mm. Pelvic retroperitoneal lymph nodes have decreased in size in the interval as well. Index left external iliac lymph node measures 10 mm (2/104), previously 1.6 cm. Inguinal lymph nodes are not enlarged by CT size criteria. Reproductive: Uterus is visualized.  No adnexal mass. Other: Mild laxity of the ventral abdominal wall at the umbilicus. No free fluid. Mesenteries and peritoneum are otherwise unremarkable. Musculoskeletal: No worrisome lytic or sclerotic lesions. Mild L1 compression deformity, unchanged. IMPRESSION: 1. Slight enlargement of a high right paratracheal lymph node. Otherwise, lymph nodes in the axillary regions, abdomen and pelvis have decreased in size in the interval, consistent with treatment response. 2. Interval decrease in size and number of pulmonary nodules, indicative of metastatic disease. 3. There may be mild nonspecific diffuse esophageal wall thickening. 4. Aortic atherosclerosis (ICD10-I70.0). Coronary artery calcification. 5. Enlarged pulmonic trunk, indicative of arterial hypertension. Electronically Signed   By: Lorin Picket M.D.   On: 10/12/2019 13:05   DG Swallowing Func-Speech Pathology  Result Date: 10/14/2019 Objective Swallowing Evaluation: Type of Study: MBS-Modified Barium Swallow Study  Patient Details Name: MUSLIMA TOPPINS MRN: [id number] Date of Birth: Jun 25, 1961 Today's Date: 10/14/2019 Time: SLP Start Time (ACUTE ONLY): 1436 -SLP Stop Time (ACUTE ONLY): 1453 SLP Time Calculation (min) (ACUTE ONLY): 17 min Past Medical History: Past Medical History: Diagnosis Date . Atrial fibrillation ([place])  . Cystic breast  . Depression  . Diffuse cystic mastopathy  . Dyslipidemia (high LDL; low HDL)  . Hypertension  . Iron deficiency anemia, unspecified  . Lymphadenopathy of head and neck  04/05/2015 . Medical non-compliance  . Mitral regurgitation 02/2017  moderate to severe . Persistent atrial fibrillation with rapid ventricular response ([place]) 07/15/2017 Past Surgical History: Past Surgical History: Procedure Laterality Date . CARDIOVERSION N/A 04/02/2017  Procedure: CARDIOVERSION;  Surgeon: Dorothy Spark, MD;  Location: [facility] END[facility];  Service: Cardiovascular;  Laterality: N/A; . CARDIOVERSION N/A 08/19/2017  Procedure: CARDIOVERSION;  Surgeon: Larey Dresser, MD;  Location: [facility];  Service: Cardiovascular;  Laterality: N/A; . IR IMAGING GUIDED PORT INSERTION  06/29/2018 . TUBAL LIGATION   HPI: HALLY COLELLA is a 60 y.o. female with chronic lymphocytic leukemia. Per chart saw Dr. Ardis Hughs for dysphagia in 2018 for evaluation of dysphagia. Per chart, chemotherapy has been on hold due to her inability to swallow, odonophagia and seen  week ago for  evaluation of nausea, vomiting. Additionally any thing she eats gets stuck and she subsequently vomits it up. This is not like the dysphagia she had in 2018, it is much worse. Chest CT suggests mild non-specific esophageal wall thickening. Suspect oral / esophageal candida but viral etiologies such as HSV, CMV cannot be excluded. Some mild improvement with  Diflucan + Magic mouthwash  No data recorded Assessment / Plan / Recommendation CHL IP CLINICAL IMPRESSIONS 10/14/2019 Clinical Impression Pt exhibits significant labial and facial weakness and reduced ROM. She affirmed she was told she had Bell's palsy however this therapist unable to find confirmation through documentation. Despite impairments and oral candidias she was able to functionally masticate, manipulate and transit puree, thin as well as regular texture boluses and denied odonophagia during study. Thin barium was penetrated on two trials however completely exited laryngeal vestibule during the swallow (one was close to cords). Chin tuck prevented but pt stated it hurt her neck and did  not wish to perform. Mild residue mid esophagus(MBS does not diagnose below level of UES). Discussed options for diet textures and she stated she would not eat puree and "could order soft food." Recommend Dys 3, thin, crushed pills and allow pt to choose foods. ST will continue to see for tolerance of recommendations.   SLP Visit Diagnosis Dysphagia, pharyngeal phase (R13.13) Attention and concentration deficit following -- Frontal lobe and executive function deficit following -- Impact on safety and function Mild aspiration risk   CHL IP TREATMENT RECOMMENDATION 10/14/2019 Treatment Recommendations Therapy as outlined in treatment plan below   Prognosis 10/14/2019 Prognosis for Safe Diet Advancement Good Barriers to Reach Goals -- Barriers/Prognosis Comment -- CHL IP DIET RECOMMENDATION 10/14/2019 SLP Diet Recommendations Dysphagia 3 (Mech soft) solids;Thin liquid Liquid Administration via Cup;Straw Medication Administration Crushed with puree Compensations Slow rate;Small sips/bites Postural Changes Seated upright at 90 degrees   CHL IP OTHER RECOMMENDATIONS 10/14/2019 Recommended Consults -- Oral Care Recommendations Oral care BID Other Recommendations --   CHL IP FOLLOW UP RECOMMENDATIONS 10/14/2019 Follow up Recommendations Other (comment)   CHL IP FREQUENCY AND DURATION 10/14/2019 Speech Therapy Frequency (ACUTE ONLY) min 2x/week Treatment Duration 2 weeks      CHL IP ORAL PHASE 10/14/2019 Oral Phase WFL Oral - Pudding Teaspoon -- Oral - Pudding Cup -- Oral - Honey Teaspoon -- Oral - Honey Cup -- Oral - Nectar Teaspoon -- Oral - Nectar Cup -- Oral - Nectar Straw -- Oral - Thin Teaspoon -- Oral - Thin Cup -- Oral - Thin Straw -- Oral - Puree -- Oral - Mech Soft -- Oral - Regular -- Oral - Multi-Consistency -- Oral - Pill -- Oral Phase - Comment --  CHL IP PHARYNGEAL PHASE 10/14/2019 Pharyngeal Phase Impaired Pharyngeal- Pudding Teaspoon -- Pharyngeal -- Pharyngeal- Pudding Cup -- Pharyngeal -- Pharyngeal- Honey Teaspoon --  Pharyngeal -- Pharyngeal- Honey Cup -- Pharyngeal -- Pharyngeal- Nectar Teaspoon -- Pharyngeal -- Pharyngeal- Nectar Cup WFL;Pharyngeal residue - valleculae Pharyngeal -- Pharyngeal- Nectar Straw -- Pharyngeal -- Pharyngeal- Thin Teaspoon -- Pharyngeal -- Pharyngeal- Thin Cup Penetration/Aspiration during swallow Pharyngeal Material enters airway, CONTACTS cords and then ejected out Pharyngeal- Thin Straw WFL Pharyngeal -- Pharyngeal- Puree -- Pharyngeal -- Pharyngeal- Mechanical Soft WFL Pharyngeal -- Pharyngeal- Regular -- Pharyngeal -- Pharyngeal- Multi-consistency -- Pharyngeal -- Pharyngeal- Pill -- Pharyngeal -- Pharyngeal Comment --  CHL IP CERVICAL ESOPHAGEAL PHASE 10/14/2019 Cervical Esophageal Phase WFL Pudding Teaspoon -- Pudding Cup -- Honey Teaspoon -- Honey Cup -- Nectar Teaspoon -- Nectar Cup -- Nectar Straw -- Thin Teaspoon -- Thin Cup -- Thin Straw -- Puree -- Mechanical Soft -- Regular -- Multi-consistency -- Pill -- Cervical Esophageal Comment -- [place] Siren 10/14/2019, 5:03 PM  Orbie Pyo Litaker M.Ed Risk analyst [phone] Office [phone]

## 2019-10-27 NOTE — Assessment & Plan Note (Signed)
This is due to poor oral intake Due to difficulties with swallowing, I recommend she focus on potassium rich diet then oral potassium replacement

## 2019-10-27 NOTE — Assessment & Plan Note (Signed)
She has lost a lot of weight and is malnourished due to recent dysphagia She is weak I recommend her not to return back to work until next week to allow more time to recover at home I will fill out her disability paperwork

## 2019-10-27 NOTE — Assessment & Plan Note (Signed)
Her recent dysphagia was related to severe esophagitis seen on EGD She will continue taking Carafate and proton pump inhibitor

## 2019-10-27 NOTE — Assessment & Plan Note (Signed)
Her blood pressure is elevated likely due to anxiety She will resume taking her blood pressure medications

## 2019-10-27 NOTE — Assessment & Plan Note (Signed)
Dysphagia has improved Recent CT imaging showed positive response to therapy She will resume taking chemotherapy and I plan to see her again next month for further follow-up

## 2019-10-27 NOTE — Progress Notes (Signed)
At [place], [place] notified S. Hansen, RN of K+ 2.6 with readback.

## 2019-10-27 NOTE — Telephone Encounter (Signed)
-----   Message from Heath Lark, MD sent at 10/27/2019 11:00 AM EST ----- Regarding: labs Please remind her about neutropenic precaution As discussed, I gave her a potassium rich diet sheet. I do not recommend potassium replacement as it is hard to swallow.

## 2019-10-28 ENCOUNTER — Telehealth: Payer: Self-pay | Admitting: Hematology and Oncology

## 2019-10-28 ENCOUNTER — Other Ambulatory Visit: Payer: Self-pay | Admitting: *Deleted

## 2019-10-28 MED ORDER — APIXABAN 5 MG PO TABS: 5.0000 mg | ORAL_TABLET | Freq: Two times a day (BID) | ORAL | 1 refills | Status: DC

## 2019-10-28 NOTE — Telephone Encounter (Signed)
Scheduled per 1/21 sch msg. Called and spoke with pt, confirmed 2/23 appt

## 2019-10-28 NOTE — Addendum Note (Signed)
Addended by: Johny Shock B on: 10/28/2019 08:30 AM   Modules accepted: Orders

## 2019-10-28 NOTE — Telephone Encounter (Signed)
Prescription refill request for Eliquis received.  Last office visit: [place], 08/30/2019 Scr: 0.54, 10/27/2019 Age: 59 y.o. Weight: 86 kg   Prescription refill sent.

## 2019-11-01 MED FILL — SULFAMETHOXAZOLE-TMP SS TAB: 400-80 | 30 days supply | Qty: 30 | Fill #2

## 2019-11-01 MED FILL — VALGANCICLOVIR 450 MG TAB: 450 | 30 days supply | Qty: 60 | Fill #2

## 2019-11-28 MED FILL — VALGANCICLOVIR 450 MG TAB: 450 | 30 days supply | Qty: 60 | Fill #3

## 2019-11-28 MED FILL — SULFAMETHOXAZOLE-TMP SS TAB: 400-80 | 30 days supply | Qty: 30 | Fill #3

## 2019-11-29 ENCOUNTER — Inpatient Hospital Stay: Payer: BC Managed Care – PPO

## 2019-11-29 ENCOUNTER — Inpatient Hospital Stay: Payer: BC Managed Care – PPO | Admitting: Hematology and Oncology

## 2019-11-29 ENCOUNTER — Telehealth: Payer: Self-pay

## 2019-11-29 ENCOUNTER — Telehealth: Payer: Self-pay | Admitting: Hematology and Oncology

## 2019-11-29 ENCOUNTER — Encounter: Payer: Self-pay | Admitting: Hematology and Oncology

## 2019-11-29 ENCOUNTER — Other Ambulatory Visit: Payer: Self-pay

## 2019-11-29 ENCOUNTER — Inpatient Hospital Stay: Payer: BC Managed Care – PPO | Attending: Hematology and Oncology

## 2019-11-29 VITALS — BP 171/90 | HR 55 | Temp 97.8°F | Resp 18 | Ht 67.0 in | Wt 192.0 lb

## 2019-11-29 DIAGNOSIS — R1310 Dysphagia, unspecified: Secondary | ICD-10-CM | POA: Insufficient documentation

## 2019-11-29 DIAGNOSIS — Z79899 Other long term (current) drug therapy: Secondary | ICD-10-CM | POA: Insufficient documentation

## 2019-11-29 DIAGNOSIS — C9112 Chronic lymphocytic leukemia of B-cell type in relapse: Secondary | ICD-10-CM | POA: Insufficient documentation

## 2019-11-29 DIAGNOSIS — R1319 Other dysphagia: Secondary | ICD-10-CM

## 2019-11-29 DIAGNOSIS — C9110 Chronic lymphocytic leukemia of B-cell type not having achieved remission: Secondary | ICD-10-CM

## 2019-11-29 DIAGNOSIS — Z452 Encounter for adjustment and management of vascular access device: Secondary | ICD-10-CM

## 2019-11-29 DIAGNOSIS — D61818 Other pancytopenia: Secondary | ICD-10-CM

## 2019-11-29 DIAGNOSIS — I10 Essential (primary) hypertension: Secondary | ICD-10-CM

## 2019-11-29 DIAGNOSIS — Z95828 Presence of other vascular implants and grafts: Secondary | ICD-10-CM

## 2019-11-29 LAB — COMPREHENSIVE METABOLIC PANEL
ALT: 12 U/L (ref 0–44)
AST: 15 U/L (ref 15–41)
Albumin: 3.8 g/dL (ref 3.5–5.0)
Alkaline Phosphatase: 57 U/L (ref 38–126)
Anion gap: 9 (ref 5–15)
BUN: 10 mg/dL (ref 6–20)
CO2: 25 mmol/L (ref 22–32)
Calcium: 9 mg/dL (ref 8.9–10.3)
Chloride: 109 mmol/L (ref 98–111)
Creatinine, Ser: 0.71 mg/dL (ref 0.44–1.00)
GFR calc Af Amer: >60 mL/min (ref >60)
GFR calc non Af Amer: >60 mL/min (ref >60)
Glucose, Bld: 95 mg/dL (ref 70–99)
Potassium: 3.9 mmol/L (ref 3.5–5.1)
Sodium: 143 mmol/L (ref 135–145)
Total Bilirubin: 0.6 mg/dL (ref 0.3–1.2)
Total Protein: 6.2 g/dL — ABNORMAL LOW (ref 6.5–8.1)

## 2019-11-29 LAB — CBC WITH DIFFERENTIAL/PLATELET
Abs Immature Granulocytes: 0.03 10*3/uL (ref 0.00–0.07)
Basophils Absolute: 0.1 10*3/uL (ref 0.0–0.1)
Basophils Relative: 2 %
Eosinophils Absolute: 0.1 10*3/uL (ref 0.0–0.5)
Eosinophils Relative: 3 %
HCT: 35.6 % — ABNORMAL LOW (ref 36.0–46.0)
Hemoglobin: 11.5 g/dL — ABNORMAL LOW (ref 12.0–15.0)
Immature Granulocytes: 1 %
Lymphocytes Relative: 53 %
Lymphs Abs: 1.5 10*3/uL (ref 0.7–4.0)
MCH: 28.8 pg (ref 26.0–34.0)
MCHC: 32.3 g/dL (ref 30.0–36.0)
MCV: 89.2 fL (ref 80.0–100.0)
Monocytes Absolute: 0.2 10*3/uL (ref 0.1–1.0)
Monocytes Relative: 7 %
Neutro Abs: 0.9 10*3/uL — ABNORMAL LOW (ref 1.7–7.7)
Neutrophils Relative %: 34 %
Platelets: 285 10*3/uL (ref 150–400)
RBC: 3.99 MIL/uL (ref 3.87–5.11)
RDW: 13.7 % (ref 11.5–15.5)
WBC: 2.8 10*3/uL — ABNORMAL LOW (ref 4.0–10.5)
nRBC: 0 % (ref 0.0–0.2)

## 2019-11-29 MED ORDER — SODIUM CHLORIDE 0.9% FLUSH
10.0000 mL | Freq: Once | INTRAVENOUS | Status: AC
  Administered 2019-11-29: 10:00:00 10 mL
  Filled 2019-11-29: qty 10

## 2019-11-29 MED ORDER — HEPARIN SOD (PORK) LOCK FLUSH 100 UNIT/ML IV SOLN
500.0000 [IU] | Freq: Once | INTRAVENOUS | Status: AC
  Administered 2019-11-29: 500 [IU]
  Filled 2019-11-29: qty 5

## 2019-11-29 MED FILL — COPIKTRA 25 MG CAPS: 25 | 28 days supply | Qty: 56 | Fill #3

## 2019-11-29 NOTE — Assessment & Plan Note (Signed)
Dysphagia has improved Her last CT imaging showed positive response to therapy She will continue treatment as scheduled I plan to repeat CT imaging in the first week of April for objective assessment of response to therapy

## 2019-11-29 NOTE — Assessment & Plan Note (Signed)
Her blood pressure is elevated likely due to anxiety Recommend she start blood pressure monitoring at home and to consult her primary care doctor for medication adjustment if needed

## 2019-11-29 NOTE — Assessment & Plan Note (Signed)
This is likely due to recent treatment. The patient denies recent history of fevers, cough, chills, diarrhea or dysuria. She is asymptomatic from the leukopenia. I will observe for now.  We discussed neutropenic precaution

## 2019-11-29 NOTE — Assessment & Plan Note (Signed)
Her dysphagia has resolved Her nutrition has improved All previously noted electrolyte imbalance since has resolved She will continue high-dose proton pump inhibitor for now

## 2019-11-29 NOTE — Progress Notes (Signed)
[place] OFFICE PROGRESS NOTE  Patient Care Team: Janith Lima, MD as PCP - General (Internal Medicine) Constance Haw, MD as PCP - Electrophysiology (Cardiology) Constance Haw, MD as Consulting Physician (Cardiology)  ASSESSMENT & PLAN:  CLL (chronic lymphocytic leukemia) [facility]) Dysphagia has improved Her last CT imaging showed positive response to therapy She will continue treatment as scheduled I plan to repeat CT imaging in the first week of April for objective assessment of response to therapy  Pancytopenia, acquired [facility]) This is likely due to recent treatment. The patient denies recent history of fevers, cough, chills, diarrhea or dysuria. She is asymptomatic from the leukopenia. I will observe for now.  We discussed neutropenic precaution  Essential hypertension Her blood pressure is elevated likely due to anxiety Recommend she start blood pressure monitoring at home and to consult her primary care doctor for medication adjustment if needed  Dysphagia Her dysphagia has resolved Her nutrition has improved All previously noted electrolyte imbalance since has resolved She will continue high-dose proton pump inhibitor for now   Orders Placed This Encounter  Procedures  . CT CHEST W CONTRAST    Standing Status:   Future    Standing Expiration Date:   11/28/2020    Order Specific Question:   If indicated for the ordered procedure, I authorize the administration of contrast media per Radiology protocol    Answer:   Yes    Order Specific Question:   Preferred imaging location?    Answer:   [facility]    Order Specific Question:   Radiology Contrast Protocol - do NOT remove file path    Answer:   \\[email or web address]    Order Specific Question:   Is patient pregnant?    Answer:   No  . CT ABDOMEN PELVIS W CONTRAST    Standing Status:   Future    Standing Expiration Date:   11/28/2020    Order Specific  Question:   If indicated for the ordered procedure, I authorize the administration of contrast media per Radiology protocol    Answer:   Yes    Order Specific Question:   Preferred imaging location?    Answer:   [facility]    Order Specific Question:   Radiology Contrast Protocol - do NOT remove file path    Answer:   \\[email or web address]    Order Specific Question:   Is patient pregnant?    Answer:   No    All questions were answered. The patient knows to call the clinic with any problems, questions or concerns. The total time spent in the appointment was 20 minutes encounter with patients including review of chart and various tests results, discussions about plan of care and coordination of care plan   Heath Lark, MD 11/29/2019 11:02 AM  INTERVAL HISTORY: Please see below for problem oriented charting. She returns for further follow-up She is doing well No recent infection, fever or chills No new lymphadenopathy Her dysphagia has resolved and she is able to swallow food and pills She is gaining some weight The patient denies any recent signs or symptoms of bleeding such as spontaneous epistaxis, hematuria or hematochezia. She has not checked her blood pressure at home lately Denies recent chest pain or shortness of breath  SUMMARY OF ONCOLOGIC HISTORY: Oncology History Overview Note  17p positive   CLL (chronic lymphocytic leukemia) ([place])  04/05/2015 Pathology Results   Accession: [id number] flow cytometry confirmed CLL. FISH was positive for  p53 mutation   04/24/2015 Imaging   Extensive lymphadenopathy throughout the neck, chest (axilla), abdomen and pelvis, as detailed above, compatible with the reported clinical history of lymphoma. 2. Mild splenomegaly.   05/03/2015 - 08/27/2015 Chemotherapy   She started on Ibrutinib, discontinued prematurely when her prescription ran out   10/10/2015 - 05/29/2017 Chemotherapy   She was restarted back on  Ibrutinib   11/13/2016 PET scan   Significant generalized reduction in size of numerous lymph nodes in the neck, chest, abdomen, and pelvis. Previously the activity of these nodes was low-level and in general a similar low-level activity is present today, significantly less than mediastinal blood pool activity, compatible with Deauville 2. 2. Coronary atherosclerosis. Mild cardiomegaly. 3. Mildly prominent endometrium for age without accentuated metabolic activity in the endometrium. Consider pelvic sonography for further characterization.   05/27/2017 PET scan   1. Progressive hypermetabolic adenopathy, primarily involving cervical, axillary, pelvic and inguinal lymph nodes bilaterally, consistent with progressive lymphoma. 2. No solid visceral organ or osseous involvement.   06/16/2017 - 08/25/2017 Chemotherapy   The patient had 3 cycles of Rituximab and Bendamustine   07/15/2017 - 07/19/2017 Hospital Admission   The patient was briefly admitted to the hospital due to infusion reaction to rituximab   09/18/2017 PET scan   1. Continued considerable adenopathy in the neck, chest, abdomen, and pelvis. This is generally stable in size but mildly reduced in activity compared to the prior exam. Current levels of activity primarily Deauville 3 and Deauville 4. No splenomegaly. 2. Diffuse new ground-glass opacities in the lungs with associated hypermetabolic activity. Some forms of lymphoma infiltration can rarely cause this pattern of diffuse ground-glass opacity and hypermetabolic activity. Differential diagnostic considerations might include atypical pneumonia such as mycoplasma, acute hypersensitivity pneumonitis, or acute eosinophilic pneumonia. Pulmonary hemorrhage seems less likely to cause this degree of accentuated metabolic activity.  3. Aortic Atherosclerosis (ICD10-I70.0). Coronary atherosclerosis.   09/24/2017 -  Chemotherapy   The patient had ventoclax for chemotherapy treatment.   Rituximab is added on 11/18/17 to 04/09/18, x 6 cycles   11/19/2017 PET scan   Overall mild interval decrease in hypermetabolic lymphadenopathy throughout the neck, chest, abdomen, and pelvis. No new or increased lymphadenopathy identified.  While there has been resolution of diffuse hypermetabolic bilateral ground-glass pulmonary opacity since prior study, there is a new 14 mm hypermetabolic pulmonary nodule in the posterior right lower lobe. Time course favors inflammatory or infectious etiology over neoplasm. Recommend continued follow-up by chest CT in 3 months.   02/16/2018 PET scan   1. Adenopathy in the neck, chest, and pelvis is stable to minimally reduced in size, and is moderately reduced in activity, primarily Deauville 2 disease today. There is a right common iliac lymph node qualifying as Deauville 3 disease which is stable in size but reduced in activity. 2. Stable size but reduced activity in a pulmonary nodule in the right lower lobe. If this represents a leukemic lesion then a corresponds to Deauville 4 disease. This lesion was not readily apparent on 09/22/2017 but was reported on the prior chest CT of 11/19/2017. 3. Other imaging findings of potential clinical significance: Mild thyroid goiter. Aortic Atherosclerosis (ICD10-I70.0). Coronary atherosclerosis. Mild cardiomegaly.   04/19/2018 PET scan   1. Interval mild mixed metabolic changes, as detailed. Persistent mildly hypermetabolic bilateral axillary, mediastinal and bilateral pelvic adenopathy and mildly hypermetabolic right lower lobe pulmonary nodule compatible with lymphoproliferative disorder. Deauville 4 based on the subcarinal node. 2. Aortic Atherosclerosis (ICD10-I70.0).   06/29/2018  Procedure   Successful placement of a right internal jugular approach power injectable Port-A-Cath. The catheter is ready for immediate use.   07/26/2018 PET scan   1. Adenopathy primarily in the chest and pelvis as noted above, with size  of the mildly enlarged lymph nodes stable to minimally increased, but with nodal activity generally decreased compared to the prior exam. Uptake in these nodes is primarily Deauville 2. 2. Aortic Atherosclerosis (ICD10-I70.0). Coronary atherosclerosis with mild cardiomegaly.   10/29/2018 PET scan   1. Relatively stable sized lymphadenopathy but interval increase in hypermetabolism when compared to the most recent prior PET-CT, as detailed above. 2. No new disease is identified. 3. Stable bilateral pulmonary nodules.   11/12/2018 - 02/02/2019 Chemotherapy   The patient had riTUXimab (RITUXAN) 800 mg in sodium chloride 0.9 % 170 mL infusion, 375 mg/m2 = 800 mg, Intravenous,  Once, 3 of 4 cycles Administration: 800 mg (11/12/2018), 800 mg (12/10/2018), 800 mg (01/06/2019)  for chemotherapy treatment.    01/11/2019 Imaging   1. Significant increased adenopathy in the chest, abdomen, and pelvis compared to the 10/28/2018 PET-CT. 2. The scattered tiny pulmonary nodules and larger single right lower lobe pulmonary nodule are all stable. 3. Other imaging findings of potential clinical significance: Aortic Atherosclerosis (ICD10-I70.0). Coronary atherosclerosis. Mild cardiomegaly. Mild diffuse thyroid prominence, stable. Mild left foraminal impingement at L4-5.   01/21/2019 Procedure   Successful ultrasound-guided core biopsies of an enlarged right axillary lymph node.    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 The latter is important because it essentially rules out the possibility of mantle cell lymphoma. CD3 highlights a small population of T-cells. By flow cytometry, a kappa-restricted  monoclonal B-cell population that expresses CD19, CD20, CD5, and CD23 comprises 89% of all lymphocytes. Overall, the features are consistent with chronic lymphocytic leukemia/small lymphocytic lymphoma.   01/31/2019 Echocardiogram    1. The left ventricle has normal systolic function, with an ejection fraction of 55-60%. The cavity size was normal. Left ventricular diastolic Doppler parameters are consistent with impaired relaxation. No evidence of left ventricular regional wall motion abnormalities. GLS -20%.  2. The right ventricle has normal systolic function. The cavity was normal. There is no increase in right ventricular wall thickness.  3. The aortic valve is tricuspid. Mild calcification of the aortic valve. No stenosis of the aortic valve.  4. The aortic root is normal in size and structure.  5. 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  6. No evidence of mitral valve stenosis. No significant mitral regurgitation.  7. Normal IVC size. No complete TR doppler jet so unable to estimate PA systolic pressure.   02/03/2019 - 07/24/2019 Chemotherapy   The patient had obinutuzumab and Acalabrutinib for chemotherapy treatment.    05/02/2019 Imaging   1. Interval decrease in size axillary and mediastinal adenopathy. 2. There are a few retroperitoneal lymph nodes which are mildly increased in size. The majority of the retroperitoneal and pelvic lymph nodes are grossly similar when compared to prior exam. 3. Stable scattered pulmonary nodules.   07/22/2019 Imaging   1. Progressive disease within the neck, chest, abdomen, and pelvis, as evidenced by increase in adenopathy. 2. Similar bilateral pulmonary nodules. 3. Extensive right-sided colonic pneumatosis and small volume  extracolonic intraperitoneal gas. Correlate with abdominal symptoms. Of note, pneumatosis has been described in patients on immunotherapy. 4. Age advanced coronary artery atherosclerosis. Recommend assessment of  coronary risk factors and consideration of medical therapy. 5.  Aortic Atherosclerosis (ICD10-I70.0). 6. Similar mild L1 compression deformity. 7. Trace free pelvic fluid, similar. 8. Pulmonary artery enlargement suggests pulmonary arterial hypertension.   08/11/2019 -  Chemotherapy   The patient had duvelisib for chemotherapy treatment.     10/12/2019 Imaging   1. Slight enlargement of a high right paratracheal lymph node. Otherwise, lymph nodes in the axillary regions, abdomen and pelvis have decreased in size in the interval, consistent with treatment response. 2. Interval decrease in size and number of pulmonary nodules, indicative of metastatic disease. 3. There may be mild nonspecific diffuse esophageal wall thickening. 4. Aortic atherosclerosis (ICD10-I70.0). Coronary artery calcification. 5. Enlarged pulmonic trunk, indicative of arterial hypertension.   10/13/2019 -  Hospital Admission   She was admitted for severe dysphagia, dehydration and electrolytes abnormalities     REVIEW OF SYSTEMS:   Constitutional: Denies fevers, chills or abnormal weight loss Eyes: Denies blurriness of vision Ears, nose, mouth, throat, and face: Denies mucositis or sore throat Respiratory: Denies cough, dyspnea or wheezes Cardiovascular: Denies palpitation, chest discomfort or lower extremity swelling Gastrointestinal:  Denies nausea, heartburn or change in bowel habits Skin: Denies abnormal skin rashes Lymphatics: Denies new lymphadenopathy or easy bruising Neurological:Denies numbness, tingling or new weaknesses Behavioral/Psych: Mood is stable, no new changes  All other systems were reviewed with the patient and are negative.  I have reviewed the past medical history, past surgical history, social history and family history with the patient and they are unchanged from previous note.  ALLERGIES:  has No Known Allergies.  MEDICATIONS:  Current Outpatient Medications  Medication Sig Dispense  Refill  . apixaban (ELIQUIS) 5 MG TABS tablet Take 1 tablet (5 mg total) by mouth 2 (two) times daily. 180 tablet 1  . Cholecalciferol (VITAMIN D-3) 1000 units CAPS Take 1,000 Units by mouth daily with breakfast.    . Duvelisib (COPIKTRA) 25 MG CAPS Take 25 mg by mouth 2 (two) times daily. Take with or without food, approximately 12 hours apart. 60 capsule 11  . lidocaine-prilocaine (EMLA) cream Apply 1 application topically as needed. (Patient taking differently: Apply 1 application topically as needed (acess port). ) 30 g 6  . metoprolol succinate (TOPROL-XL) 100 MG 24 hr tablet TAKE 1 AND 1/2 TABLETS BY MOUTH EVERY DAY. TAKE WITH FOOD OR RIGHT AFTER A MEAL 135 tablet 2  . ondansetron (ZOFRAN-ODT) 8 MG disintegrating tablet Take 1 tablet (8 mg total) by mouth every 8 (eight) hours as needed for nausea or vomiting. 20 tablet 0  . pantoprazole (PROTONIX) 40 MG tablet Take 1 tablet (40 mg total) by mouth 2 (two) times daily. 60 tablet 3  . sacubitril-valsartan (ENTRESTO) 49-51 MG Take 1 tablet by mouth 2 (two) times daily. 60 tablet 3  . sucralfate (CARAFATE) 1 g tablet Take 1 tablet (1 g total) by mouth 3 (three) times daily. 90 tablet 1  . sulfamethoxazole-trimethoprim (BACTRIM) 400-80 MG tablet Take 1 tablet by mouth daily. 30 tablet 6  . valGANciclovir (VALCYTE) 450 MG tablet Take 2 tablets (900 mg total) by mouth daily. 60 tablet 6   No current facility-administered medications for this visit.    PHYSICAL EXAMINATION: ECOG PERFORMANCE STATUS: 1 - Symptomatic but completely ambulatory  Vitals:   11/29/19 1037  BP: (!) 171/90  Pulse: (!) 55  Resp: 18  Temp: 97.8 F (36.6 C)  SpO2: 100%   Filed Weights   11/29/19 1037  Weight: 192 lb (87.1 kg)    GENERAL:alert, no distress and comfortable SKIN: skin color, texture, turgor are normal, no rashes or significant lesions EYES: normal, Conjunctiva are pink and non-injected, sclera clear OROPHARYNX:no exudate, no erythema and lips,  buccal mucosa, and tongue normal  NECK: supple, thyroid normal size, non-tender, without nodularity LYMPH:  no palpable lymphadenopathy in the cervical, axillary or inguinal LUNGS: clear to auscultation and percussion with normal breathing effort HEART: regular rate & rhythm and no murmurs and no lower extremity edema ABDOMEN:abdomen soft, non-tender and normal bowel sounds Musculoskeletal:no cyanosis of digits and no clubbing  NEURO: alert & oriented x 3 with fluent speech, no focal motor/sensory deficits  LABORATORY DATA:  I have reviewed the data as listed    Component Value Date/Time   NA 143 11/29/2019 0906   NA 140 10/09/2017 1416   K 3.9 11/29/2019 0906   K 4.4 10/09/2017 1416   CL 109 11/29/2019 0906   CL 105 01/18/2013 0912   CO2 25 11/29/2019 0906   CO2 25 10/09/2017 1416   GLUCOSE 95 11/29/2019 0906   GLUCOSE 117 10/09/2017 1416   GLUCOSE 83 01/18/2013 0912   BUN 10 11/29/2019 0906   BUN 11.1 10/09/2017 1416   CREATININE 0.71 11/29/2019 0906   CREATININE 0.72 11/01/2018 0903   CREATININE 0.8 10/09/2017 1416   CALCIUM 9.0 11/29/2019 0906   CALCIUM 9.6 10/09/2017 1416   PROT 6.2 (L) 11/29/2019 0906   PROT 6.4 10/09/2017 1416   ALBUMIN 3.8 11/29/2019 0906   ALBUMIN 3.4 (L) 10/09/2017 1416   AST 15 11/29/2019 0906   AST 17 11/01/2018 0903   AST 18 10/09/2017 1416   ALT 12 11/29/2019 0906   ALT 15 11/01/2018 0903   ALT 19 10/09/2017 1416   ALKPHOS 57 11/29/2019 0906   ALKPHOS 69 10/09/2017 1416   BILITOT 0.6 11/29/2019 0906   BILITOT 1.3 (H) 11/01/2018 0903   BILITOT 0.57 10/09/2017 1416   GFRNONAA >60 11/29/2019 0906   GFRNONAA >60 11/01/2018 0903   GFRAA >60 11/29/2019 0906   GFRAA >60 11/01/2018 0903    No results found for: SPEP, UPEP  Lab Results  Component Value Date   WBC 2.8 (L) 11/29/2019   NEUTROABS 0.9 (L) 11/29/2019   HGB 11.5 (L) 11/29/2019   HCT 35.6 (L) 11/29/2019   MCV 89.2 11/29/2019   PLT 285 11/29/2019      Chemistry       Component Value Date/Time   NA 143 11/29/2019 0906   NA 140 10/09/2017 1416   K 3.9 11/29/2019 0906   K 4.4 10/09/2017 1416   CL 109 11/29/2019 0906   CL 105 01/18/2013 0912   CO2 25 11/29/2019 0906   CO2 25 10/09/2017 1416   BUN 10 11/29/2019 0906   BUN 11.1 10/09/2017 1416   CREATININE 0.71 11/29/2019 0906   CREATININE 0.72 11/01/2018 0903   CREATININE 0.8 10/09/2017 1416      Component Value Date/Time   CALCIUM 9.0 11/29/2019 0906   CALCIUM 9.6 10/09/2017 1416   ALKPHOS 57 11/29/2019 0906   ALKPHOS 69 10/09/2017 1416   AST 15 11/29/2019 0906   AST 17 11/01/2018 0903   AST 18 10/09/2017 1416   ALT 12 11/29/2019 0906   ALT 15 11/01/2018 0903   ALT 19 10/09/2017 1416   BILITOT 0.6 11/29/2019 0906   BILITOT 1.3 (  H) 11/01/2018 0903   BILITOT 0.57 10/09/2017 1416

## 2019-11-29 NOTE — Telephone Encounter (Signed)
Pt notified of normal K+ per MD review.  RN provided contact number for scheduling upcoming CT scan for April.   No further needs at this time.

## 2019-11-29 NOTE — Telephone Encounter (Signed)
Scheduled 4/5 and 4/6 per 2/23 sch msg. Left pt a voicemail with appt details. Also, mailed a reminder letter and calendar.

## 2019-12-13 ENCOUNTER — Other Ambulatory Visit: Payer: Self-pay | Admitting: Hematology and Oncology

## 2019-12-22 MED FILL — VALGANCICLOVIR 450 MG TAB: 450 | 30 days supply | Qty: 60 | Fill #4

## 2019-12-22 MED FILL — SULFAMETHOXAZOLE-TMP SS TAB: 400-80 | 30 days supply | Qty: 30 | Fill #4

## 2020-01-02 MED FILL — COPIKTRA 25 MG CAPS: 25 | 28 days supply | Qty: 56 | Fill #4

## 2020-01-09 ENCOUNTER — Inpatient Hospital Stay: Payer: BC Managed Care – PPO | Attending: Hematology and Oncology

## 2020-01-09 ENCOUNTER — Ambulatory Visit (HOSPITAL_COMMUNITY)
Admission: RE | Admit: 2020-01-09 | Discharge: 2020-01-09 | Disposition: A | Discharge status: Home / Self Care | Payer: BC Managed Care – PPO | Source: Ambulatory Visit | Attending: Hematology and Oncology | Admitting: Hematology and Oncology

## 2020-01-09 ENCOUNTER — Encounter (HOSPITAL_COMMUNITY): Payer: Self-pay | Admitting: Radiology

## 2020-01-09 ENCOUNTER — Inpatient Hospital Stay: Payer: BC Managed Care – PPO

## 2020-01-09 ENCOUNTER — Other Ambulatory Visit: Payer: Self-pay

## 2020-01-09 DIAGNOSIS — C9110 Chronic lymphocytic leukemia of B-cell type not having achieved remission: Secondary | ICD-10-CM

## 2020-01-09 DIAGNOSIS — Z7901 Long term (current) use of anticoagulants: Secondary | ICD-10-CM | POA: Diagnosis not present

## 2020-01-09 DIAGNOSIS — Z452 Encounter for adjustment and management of vascular access device: Secondary | ICD-10-CM

## 2020-01-09 DIAGNOSIS — Z9221 Personal history of antineoplastic chemotherapy: Secondary | ICD-10-CM | POA: Diagnosis not present

## 2020-01-09 DIAGNOSIS — D61818 Other pancytopenia: Secondary | ICD-10-CM | POA: Insufficient documentation

## 2020-01-09 DIAGNOSIS — Z95828 Presence of other vascular implants and grafts: Secondary | ICD-10-CM

## 2020-01-09 DIAGNOSIS — C969 Malignant neoplasm of lymphoid, hematopoietic and related tissue, unspecified: Secondary | ICD-10-CM | POA: Diagnosis not present

## 2020-01-09 DIAGNOSIS — Z79899 Other long term (current) drug therapy: Secondary | ICD-10-CM | POA: Diagnosis not present

## 2020-01-09 DIAGNOSIS — I428 Other cardiomyopathies: Secondary | ICD-10-CM | POA: Diagnosis not present

## 2020-01-09 DIAGNOSIS — C9112 Chronic lymphocytic leukemia of B-cell type in relapse: Secondary | ICD-10-CM | POA: Diagnosis not present

## 2020-01-09 LAB — COMPREHENSIVE METABOLIC PANEL
ALT: 9 U/L (ref 0–44)
AST: 14 U/L — ABNORMAL LOW (ref 15–41)
Albumin: 3.5 g/dL (ref 3.5–5.0)
Alkaline Phosphatase: 46 U/L (ref 38–126)
Anion gap: 10 (ref 5–15)
BUN: 8 mg/dL (ref 6–20)
CO2: 23 mmol/L (ref 22–32)
Calcium: 8.8 mg/dL — ABNORMAL LOW (ref 8.9–10.3)
Chloride: 109 mmol/L (ref 98–111)
Creatinine, Ser: 0.7 mg/dL (ref 0.44–1.00)
GFR calc Af Amer: >60 mL/min (ref >60)
GFR calc non Af Amer: >60 mL/min (ref >60)
Glucose, Bld: 99 mg/dL (ref 70–99)
Potassium: 3.7 mmol/L (ref 3.5–5.1)
Sodium: 142 mmol/L (ref 135–145)
Total Bilirubin: 0.5 mg/dL (ref 0.3–1.2)
Total Protein: 6 g/dL — ABNORMAL LOW (ref 6.5–8.1)

## 2020-01-09 LAB — CBC WITH DIFFERENTIAL/PLATELET
Abs Immature Granulocytes: 0.09 10*3/uL — ABNORMAL HIGH (ref 0.00–0.07)
Basophils Absolute: 0.1 10*3/uL (ref 0.0–0.1)
Basophils Relative: 4 %
Eosinophils Absolute: 0.2 10*3/uL (ref 0.0–0.5)
Eosinophils Relative: 8 %
HCT: 35.4 % — ABNORMAL LOW (ref 36.0–46.0)
Hemoglobin: 11.5 g/dL — ABNORMAL LOW (ref 12.0–15.0)
Immature Granulocytes: 3 %
Lymphocytes Relative: 51 %
Lymphs Abs: 1.5 10*3/uL (ref 0.7–4.0)
MCH: 28.8 pg (ref 26.0–34.0)
MCHC: 32.5 g/dL (ref 30.0–36.0)
MCV: 88.5 fL (ref 80.0–100.0)
Monocytes Absolute: 0.2 10*3/uL (ref 0.1–1.0)
Monocytes Relative: 7 %
Neutro Abs: 0.8 10*3/uL — ABNORMAL LOW (ref 1.7–7.7)
Neutrophils Relative %: 27 %
Platelets: 321 10*3/uL (ref 150–400)
RBC: 4 MIL/uL (ref 3.87–5.11)
RDW: 12.8 % (ref 11.5–15.5)
WBC: 3 10*3/uL — ABNORMAL LOW (ref 4.0–10.5)
nRBC: 0 % (ref 0.0–0.2)

## 2020-01-09 MED ORDER — HEPARIN SOD (PORK) LOCK FLUSH 100 UNIT/ML IV SOLN
250.0000 [IU] | Freq: Once | INTRAVENOUS | Status: DC
  Filled 2020-01-09: qty 5

## 2020-01-09 MED ORDER — IOHEXOL 300 MG/ML  SOLN
100.0000 mL | Freq: Once | INTRAMUSCULAR | Status: AC | PRN
  Administered 2020-01-09: 100 mL via INTRAVENOUS

## 2020-01-09 MED ORDER — SODIUM CHLORIDE (PF) 0.9 % IJ SOLN
INTRAMUSCULAR | Status: AC
  Filled 2020-01-09: qty 50

## 2020-01-09 MED ORDER — HEPARIN SOD (PORK) LOCK FLUSH 100 UNIT/ML IV SOLN
INTRAVENOUS | Status: AC
  Administered 2020-01-09: 500 [IU]
  Filled 2020-01-09: qty 5

## 2020-01-09 MED ORDER — HEPARIN SOD (PORK) LOCK FLUSH 100 UNIT/ML IV SOLN: 500.0000 [IU] | Freq: Once | INTRAVENOUS | Status: DC

## 2020-01-09 MED ORDER — SODIUM CHLORIDE 0.9% FLUSH
10.0000 mL | Freq: Once | INTRAVENOUS | Status: AC
  Administered 2020-01-09: 10 mL
  Filled 2020-01-09: qty 10

## 2020-01-09 NOTE — Progress Notes (Signed)
Patient left accessed for CT 

## 2020-01-10 ENCOUNTER — Encounter: Payer: Self-pay | Admitting: Hematology and Oncology

## 2020-01-10 ENCOUNTER — Inpatient Hospital Stay: Payer: BC Managed Care – PPO | Admitting: Hematology and Oncology

## 2020-01-10 ENCOUNTER — Telehealth: Payer: Self-pay | Admitting: Hematology and Oncology

## 2020-01-10 ENCOUNTER — Other Ambulatory Visit: Payer: Self-pay

## 2020-01-10 DIAGNOSIS — Z9221 Personal history of antineoplastic chemotherapy: Secondary | ICD-10-CM | POA: Diagnosis not present

## 2020-01-10 DIAGNOSIS — C9110 Chronic lymphocytic leukemia of B-cell type not having achieved remission: Secondary | ICD-10-CM | POA: Diagnosis not present

## 2020-01-10 DIAGNOSIS — D61818 Other pancytopenia: Secondary | ICD-10-CM | POA: Diagnosis not present

## 2020-01-10 DIAGNOSIS — I428 Other cardiomyopathies: Secondary | ICD-10-CM

## 2020-01-10 DIAGNOSIS — Z79899 Other long term (current) drug therapy: Secondary | ICD-10-CM | POA: Diagnosis not present

## 2020-01-10 DIAGNOSIS — Z7901 Long term (current) use of anticoagulants: Secondary | ICD-10-CM | POA: Diagnosis not present

## 2020-01-10 DIAGNOSIS — C9112 Chronic lymphocytic leukemia of B-cell type in relapse: Secondary | ICD-10-CM | POA: Diagnosis not present

## 2020-01-10 NOTE — Telephone Encounter (Signed)
Scheduled appt per 4/6 sch message  - unable to reach pt . Left message with appt date and time   

## 2020-01-10 NOTE — Assessment & Plan Note (Signed)
She have no new side effects from treatment Her chronic pancytopenia is stable Review CT imaging study with the patient which shows stable disease control Even though radiologist commented that some of the subpectoral and axillary lymphadenopathy is likely enlarged, overall, this is considered a stable disease She will continue treatment as scheduled I will see her again in 6 weeks with port flushes blood work and physical exam I plan to repeat CT imaging in 3 months, around July of this year

## 2020-01-10 NOTE — Assessment & Plan Note (Signed)
This is likely due to recent treatment. The patient denies recent history of fevers, cough, chills, diarrhea or dysuria. She is asymptomatic from the leukopenia. I will observe for now.  We discussed neutropenic precaution

## 2020-01-10 NOTE — Progress Notes (Signed)
[facility] OFF[facility] PROGRESS NOTE  Patient Care Team: Janith Lima, MD as PCP - General (Internal Medicine) Constance Haw, MD as PCP - Electrophysiology (Cardiology) Constance Haw, MD as Consulting Physician (Cardiology)  ASSESSMENT & PLAN:  CLL (chronic lymphocytic leukemia) ([place]) She have no new side effects from treatment Her chronic pancytopenia is stable Review CT imaging study with the patient which shows stable disease control Even though radiologist commented that some of the subpectoral and axillary lymphadenopathy is likely enlarged, overall, this is considered a stable disease She will continue treatment as scheduled I will see her again in 6 weeks with port flushes blood work and physical exam I plan to repeat CT imaging in 3 months, around July of this year  Pancytopenia, acquired [facility]) This is likely due to recent treatment. The patient denies recent history of fevers, cough, chills, diarrhea or dysuria. She is asymptomatic from the leukopenia. I will observe for now.  We discussed neutropenic precaution  Nonischemic cardiomyopathy ([place]) She have no signs or symptoms of congestive heart failure She will continue medical management   No orders of the defined types were placed in this encounter.   All questions were answered. The patient knows to call the clinic with any problems, questions or concerns. The total time spent in the appointment was 20 minutes encounter with patients including review of chart and various tests results, discussions about plan of care and coordination of care plan   Heath Lark, MD 01/10/2020 8:39 AM  INTERVAL HISTORY: Please see below for problem oriented charting. She returns for further follow-up She tolerated treatment well No recent infection, fever or chills No recent bleeding or exacerbation of congestive heart failure  SUMMARY OF ONCOLOGIC HISTORY: Oncology History Overview Note  17p positive    CLL (chronic lymphocytic leukemia) ([place])  04/05/2015 Pathology Results   Accession: [id number] flow cytometry confirmed CLL. FISH was positive for p53 mutation   04/24/2015 Imaging   Extensive lymphadenopathy throughout the neck, chest (axilla), abdomen and pelvis, as detailed above, compatible with the reported clinical history of lymphoma. 2. Mild splenomegaly.   05/03/2015 - 08/27/2015 Chemotherapy   She started on Ibrutinib, discontinued prematurely when her prescription ran out   10/10/2015 - 05/29/2017 Chemotherapy   She was restarted back on Ibrutinib   11/13/2016 PET scan   Significant generalized reduction in size of numerous lymph nodes in the neck, chest, abdomen, and pelvis. Previously the activity of these nodes was low-level and in general a similar low-level activity is present today, significantly less than mediastinal blood pool activity, compatible with Deauville 2. 2. Coronary atherosclerosis. Mild cardiomegaly. 3. Mildly prominent endometrium for age without accentuated metabolic activity in the endometrium. Consider pelvic sonography for further characterization.   05/27/2017 PET scan   1. Progressive hypermetabolic adenopathy, primarily involving cervical, axillary, pelvic and inguinal lymph nodes bilaterally, consistent with progressive lymphoma. 2. No solid visceral organ or osseous involvement.   06/16/2017 - 08/25/2017 Chemotherapy   The patient had 3 cycles of Rituximab and Bendamustine   07/15/2017 - 07/19/2017 Hospital Admission   The patient was briefly admitted to the hospital due to infusion reaction to rituximab   09/18/2017 PET scan   1. Continued considerable adenopathy in the neck, chest, abdomen, and pelvis. This is generally stable in size but mildly reduced in activity compared to the prior exam. Current levels of activity primarily Deauville 3 and Deauville 4. No splenomegaly. 2. Diffuse new ground-glass opacities in the lungs with associated  hypermetabolic activity. Some forms of lymphoma infiltration can rarely cause this pattern of diffuse ground-glass opacity and hypermetabolic activity. Differential diagnostic considerations might include atypical pneumonia such as mycoplasma, acute hypersensitivity pneumonitis, or acute eosinophilic pneumonia. Pulmonary hemorrhage seems less likely to cause this degree of accentuated metabolic activity.  3. Aortic Atherosclerosis (ICD10-I70.0). Coronary atherosclerosis.   09/24/2017 -  Chemotherapy   The patient had ventoclax for chemotherapy treatment.  Rituximab is added on 11/18/17 to 04/09/18, x 6 cycles   11/19/2017 PET scan   Overall mild interval decrease in hypermetabolic lymphadenopathy throughout the neck, chest, abdomen, and pelvis. No new or increased lymphadenopathy identified.  While there has been resolution of diffuse hypermetabolic bilateral ground-glass pulmonary opacity since prior study, there is a new 14 mm hypermetabolic pulmonary nodule in the posterior right lower lobe. Time course favors inflammatory or infectious etiology over neoplasm. Recommend continued follow-up by chest CT in 3 months.   02/16/2018 PET scan   1. Adenopathy in the neck, chest, and pelvis is stable to minimally reduced in size, and is moderately reduced in activity, primarily Deauville 2 disease today. There is a right common iliac lymph node qualifying as Deauville 3 disease which is stable in size but reduced in activity. 2. Stable size but reduced activity in a pulmonary nodule in the right lower lobe. If this represents a leukemic lesion then a corresponds to Deauville 4 disease. This lesion was not readily apparent on 09/22/2017 but was reported on the prior chest CT of 11/19/2017. 3. Other imaging findings of potential clinical significance: Mild thyroid goiter. Aortic Atherosclerosis (ICD10-I70.0). Coronary atherosclerosis. Mild cardiomegaly.   04/19/2018 PET scan   1. Interval mild mixed metabolic  changes, as detailed. Persistent mildly hypermetabolic bilateral axillary, mediastinal and bilateral pelvic adenopathy and mildly hypermetabolic right lower lobe pulmonary nodule compatible with lymphoproliferative disorder. Deauville 4 based on the subcarinal node. 2. Aortic Atherosclerosis (ICD10-I70.0).   06/29/2018 Procedure   Successful placement of a right internal jugular approach power injectable Port-A-Cath. The catheter is ready for immediate use.   07/26/2018 PET scan   1. Adenopathy primarily in the chest and pelvis as noted above, with size of the mildly enlarged lymph nodes stable to minimally increased, but with nodal activity generally decreased compared to the prior exam. Uptake in these nodes is primarily Deauville 2. 2. Aortic Atherosclerosis (ICD10-I70.0). Coronary atherosclerosis with mild cardiomegaly.   10/29/2018 PET scan   1. Relatively stable sized lymphadenopathy but interval increase in hypermetabolism when compared to the most recent prior PET-CT, as detailed above. 2. No new disease is identified. 3. Stable bilateral pulmonary nodules.   11/12/2018 - 02/02/2019 Chemotherapy   The patient had riTUXimab (RITUXAN) 800 mg in sodium chloride 0.9 % 170 mL infusion, 375 mg/m2 = 800 mg, Intravenous,  Once, 3 of 4 cycles Administration: 800 mg (11/12/2018), 800 mg (12/10/2018), 800 mg (01/06/2019)  for chemotherapy treatment.    01/11/2019 Imaging   1. Significant increased adenopathy in the chest, abdomen, and pelvis compared to the 10/28/2018 PET-CT. 2. The scattered tiny pulmonary nodules and larger single right lower lobe pulmonary nodule are all stable. 3. Other imaging findings of potential clinical significance: Aortic Atherosclerosis (ICD10-I70.0). Coronary atherosclerosis. Mild cardiomegaly. Mild diffuse thyroid prominence, stable. Mild left foraminal impingement at L4-5.   01/21/2019 Procedure   Successful ultrasound-guided core biopsies of an enlarged right axillary  lymph node.    01/21/2019 Pathology Results   Lymph node, needle/core biopsy, Right Axilla - NON-HODGKIN B-CELL LYMPHOMA CONSISTENT WITH CHRONIC  LYMPHOCYTIC LEUKEMIA/SMALL LYMPHOCYTIC LYMPHOMA - SEE COMMENT Microscopic Comment The biopsies are small core biopsies composed of a monotonous population of lymphocytes which are positive for CD20 without expression of cyclin-D1. The latter is important because it essentially rules out the possibility of mantle cell lymphoma. CD3 highlights a small population of T-cells. By flow cytometry, a kappa-restricted monoclonal B-cell population that expresses CD19, CD20, CD5, and CD23 comprises 89% of all lymphocytes. Overall, the features are consistent with chronic lymphocytic leukemia/small lymphocytic lymphoma.   01/31/2019 Echocardiogram    1. The left ventricle has normal systolic function, with an ejection fraction of 55-60%. The cavity size was normal. Left ventricular diastolic Doppler parameters are consistent with impaired relaxation. No evidence of left ventricular regional wall motion abnormalities. GLS -20%.  2. The right ventricle has normal systolic function. The cavity was normal. There is no increase in right ventricular wall thickness.  3. The aortic valve is tricuspid. Mild calcification of the aortic valve. No stenosis of the aortic valve.  4. The aortic root is normal in size and structure.  5. 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  6. No evidence of mitral valve stenosis. No significant mitral regurgitation.  7. Normal IVC size. No complete TR doppler jet so unable to estimate PA systolic pressure.   02/03/2019 - 07/24/2019 Chemotherapy   The patient had obinutuzumab and Acalabrutinib for chemotherapy treatment.    05/02/2019 Imaging   1. Interval decrease in size axillary and mediastinal adenopathy. 2. There are a few retroperitoneal lymph nodes which are mildly increased in size. The majority of the  retroperitoneal and pelvic lymph nodes are grossly similar when compared to prior exam. 3. Stable scattered pulmonary nodules.   07/22/2019 Imaging   1. Progressive disease within the neck, chest, abdomen, and pelvis, as evidenced by increase in adenopathy. 2. Similar bilateral pulmonary nodules. 3. Extensive right-sided colonic pneumatosis and small volume extracolonic intraperitoneal gas. Correlate with abdominal symptoms. Of note, pneumatosis has been described in patients on immunotherapy. 4. Age advanced coronary artery atherosclerosis. Recommend assessment of coronary risk factors and consideration of medical therapy. 5.  Aortic Atherosclerosis (ICD10-I70.0). 6. Similar mild L1 compression deformity. 7. Trace free pelvic fluid, similar. 8. Pulmonary artery enlargement suggests pulmonary arterial hypertension.   08/11/2019 -  Chemotherapy   The patient had duvelisib for chemotherapy treatment.     10/12/2019 Imaging   1. Slight enlargement of a high right paratracheal lymph node. Otherwise, lymph nodes in the axillary regions, abdomen and pelvis have decreased in size in the interval, consistent with treatment response. 2. Interval decrease in size and number of pulmonary nodules, indicative of metastatic disease. 3. There may be mild nonspecific diffuse esophageal wall thickening. 4. Aortic atherosclerosis (ICD10-I70.0). Coronary artery calcification. 5. Enlarged pulmonic trunk, indicative of arterial hypertension.   10/13/2019 -  Hospital Admission   She was admitted for severe dysphagia, dehydration and electrolytes abnormalities   01/09/2020 Imaging   1. Slight enlargement of bilateral axillary and subpectoral lymph nodes. Otherwise, small to borderline enlarged lymph nodes in the chest, abdomen and pelvis are similar to the prior exam. 2. Right lower lobe nodule is stable. 3. Aortic atherosclerosis (ICD10-I70.0). Coronary artery calcification. 4. Enlarged pulmonic trunk, indicative  of pulmonary arterial hypertension.     REVIEW OF SYSTEMS:   Constitutional: Denies fevers, chills or abnormal weight loss Eyes: Denies blurriness of vision Ears, nose, mouth, throat, and face: Denies mucositis or sore throat Respiratory: Denies cough, dyspnea or wheezes Cardiovascular: Denies  palpitation, chest discomfort or lower extremity swelling Gastrointestinal:  Denies nausea, heartburn or change in bowel habits Skin: Denies abnormal skin rashes Lymphatics: Denies new lymphadenopathy or easy bruising Neurological:Denies numbness, tingling or new weaknesses Behavioral/Psych: Mood is stable, no new changes  All other systems were reviewed with the patient and are negative.  I have reviewed the past medical history, past surgical history, social history and family history with the patient and they are unchanged from previous note.  ALLERGIES:  has No Known Allergies.  MEDICATIONS:  Current Outpatient Medications  Medication Sig Dispense Refill  . apixaban (ELIQUIS) 5 MG TABS tablet Take 1 tablet (5 mg total) by mouth 2 (two) times daily. 180 tablet 1  . Cholecalciferol (VITAMIN D-3) 1000 units CAPS Take 1,000 Units by mouth daily with breakfast.    . Duvelisib (COPIKTRA) 25 MG CAPS Take 25 mg by mouth 2 (two) times daily. Take with or without food, approximately 12 hours apart. 60 capsule 11  . lidocaine-prilocaine (EMLA) cream Apply 1 application topically as needed. (Patient taking differently: Apply 1 application topically as needed (acess port). ) 30 g 6  . metoprolol succinate (TOPROL-XL) 100 MG 24 hr tablet TAKE 1 AND 1/2 TABLETS BY MOUTH EVERY DAY. TAKE WITH FOOD OR RIGHT AFTER A MEAL 135 tablet 2  . ondansetron (ZOFRAN-ODT) 8 MG disintegrating tablet Take 1 tablet (8 mg total) by mouth every 8 (eight) hours as needed for nausea or vomiting. 20 tablet 0  . pantoprazole (PROTONIX) 40 MG tablet Take 1 tablet (40 mg total) by mouth 2 (two) times daily. 60 tablet 3  .  sacubitril-valsartan (ENTRESTO) 49-51 MG Take 1 tablet by mouth 2 (two) times daily. 60 tablet 3  . sucralfate (CARAFATE) 1 g tablet Take 1 tablet (1 g total) by mouth 3 (three) times daily. 90 tablet 1  . sulfamethoxazole-trimethoprim (BACTRIM) 400-80 MG tablet Take 1 tablet by mouth daily. 30 tablet 6  . valGANciclovir (VALCYTE) 450 MG tablet Take 2 tablets (900 mg total) by mouth daily. 60 tablet 6   No current facility-administered medications for this visit.    PHYSICAL EXAMINATION: ECOG PERFORMANCE STATUS: 1 - Symptomatic but completely ambulatory  Vitals:   01/10/20 0824  BP: (!) 160/71  Pulse: (!) 57  Resp: 18  Temp: 97.8 F (36.6 C)  SpO2: 100%   Filed Weights   01/10/20 0824  Weight: 189 lb 9.6 oz (86 kg)    GENERAL:alert, no distress and comfortable NEURO: alert & oriented x 3 with fluent speech, no focal motor/sensory deficits  LABORATORY DATA:  I have reviewed the data as listed    Component Value Date/Time   NA 142 01/09/2020 0856   NA 140 10/09/2017 1416   K 3.7 01/09/2020 0856   K 4.4 10/09/2017 1416   CL 109 01/09/2020 0856   CL 105 01/18/2013 0912   CO2 23 01/09/2020 0856   CO2 25 10/09/2017 1416   GLUCOSE 99 01/09/2020 0856   GLUCOSE 117 10/09/2017 1416   GLUCOSE 83 01/18/2013 0912   BUN 8 01/09/2020 0856   BUN 11.1 10/09/2017 1416   CREATININE 0.70 01/09/2020 0856   CREATININE 0.72 11/01/2018 0903   CREATININE 0.8 10/09/2017 1416   CALCIUM 8.8 (L) 01/09/2020 0856   CALCIUM 9.6 10/09/2017 1416   PROT 6.0 (L) 01/09/2020 0856   PROT 6.4 10/09/2017 1416   ALBUMIN 3.5 01/09/2020 0856   ALBUMIN 3.4 (L) 10/09/2017 1416   AST 14 (L) 01/09/2020 0856   AST 17 11/01/2018  0903   AST 18 10/09/2017 1416   ALT 9 01/09/2020 0856   ALT 15 11/01/2018 0903   ALT 19 10/09/2017 1416   ALKPHOS 46 01/09/2020 0856   ALKPHOS 69 10/09/2017 1416   BILITOT 0.5 01/09/2020 0856   BILITOT 1.3 (H) 11/01/2018 0903   BILITOT 0.57 10/09/2017 1416   GFRNONAA >60  01/09/2020 0856   GFRNONAA >60 11/01/2018 0903   GFRAA >60 01/09/2020 0856   GFRAA >60 11/01/2018 0903    No results found for: SPEP, UPEP  Lab Results  Component Value Date   WBC 3.0 (L) 01/09/2020   NEUTROABS 0.8 (L) 01/09/2020   HGB 11.5 (L) 01/09/2020   HCT 35.4 (L) 01/09/2020   MCV 88.5 01/09/2020   PLT 321 01/09/2020      Chemistry      Component Value Date/Time   NA 142 01/09/2020 0856   NA 140 10/09/2017 1416   K 3.7 01/09/2020 0856   K 4.4 10/09/2017 1416   CL 109 01/09/2020 0856   CL 105 01/18/2013 0912   CO2 23 01/09/2020 0856   CO2 25 10/09/2017 1416   BUN 8 01/09/2020 0856   BUN 11.1 10/09/2017 1416   CREATININE 0.70 01/09/2020 0856   CREATININE 0.72 11/01/2018 0903   CREATININE 0.8 10/09/2017 1416      Component Value Date/Time   CALCIUM 8.8 (L) 01/09/2020 0856   CALCIUM 9.6 10/09/2017 1416   ALKPHOS 46 01/09/2020 0856   ALKPHOS 69 10/09/2017 1416   AST 14 (L) 01/09/2020 0856   AST 17 11/01/2018 0903   AST 18 10/09/2017 1416   ALT 9 01/09/2020 0856   ALT 15 11/01/2018 0903   ALT 19 10/09/2017 1416   BILITOT 0.5 01/09/2020 0856   BILITOT 1.3 (H) 11/01/2018 0903   BILITOT 0.57 10/09/2017 1416       RADIOGRAPHIC STUDIES: I have reviewed multiple imaging studies with the patient I have personally reviewed the radiological images as listed and agreed with the findings in the report. CT CHEST W CONTRAST  Result Date: 01/09/2020 CLINICAL DATA:  Hematologic malignancy, assess treatment response. Ongoing chemotherapy. EXAM: CT CHEST, ABDOMEN, AND PELVIS WITH CONTRAST TECHNIQUE: Multidetector CT imaging of the chest, abdomen and pelvis was performed following the standard protocol during bolus administration of intravenous contrast. CONTRAST:  189m OMNIPAQUE IOHEXOL 300 MG/ML  SOLN COMPARISON:  10/12/2019. FINDINGS: CT CHEST FINDINGS Cardiovascular: Right IJ Port-A-Cath terminates at the SVC RA junction. Atherosclerotic calcification of the aorta and  coronary arteries. Pulmonic trunk and heart are enlarged. No pericardial effusion. Mediastinum/Nodes: Low internal jugular lymph nodes are not enlarged by CT size criteria, as before. No pathologically enlarged mediastinal or hilar lymph nodes. Enlarged axillary and subpectoral lymph nodes are seen bilaterally, measuring up to 1.5 cm on the right, increased from 10 mm on 10/12/2019. Esophagus is unremarkable. Lungs/Pleura: Posteromedial peripheral right lower lobe nodule measures 11 mm (10 x 12 mm, 7/89), similar to 10/12/2019. Lungs are otherwise clear. No pleural fluid. Airway is unremarkable. Musculoskeletal: No worrisome lytic or sclerotic lesions. CT ABDOMEN PELVIS FINDINGS Hepatobiliary: Liver and gallbladder are unremarkable. No biliary ductal dilatation. Pancreas: Negative. Spleen: Negative. Adrenals/Urinary Tract: Adrenal glands are unremarkable. Low-attenuation lesions in the right kidney measure up to 12 mm and are likely cysts although definitive characterization is limited due to size. Ureters are decompressed. Bladder is grossly unremarkable. Stomach/Bowel: Stomach, small bowel, appendix and colon are unremarkable. Vascular/Lymphatic: Atherosclerotic calcification of the aorta without aneurysm. There are unenlarged lymph nodes in the  abdominal peritoneal ligaments, small bowel mesentery and retroperitoneum. Largest lymph node is in the left common iliac station, measuring 10 mm (2/89), similar. Reproductive: Uterus is visualized.  No adnexal mass. Other: No free fluid.  Small bilateral inguinal hernias contain fat. Musculoskeletal: Mild degenerative changes in the spine. No worrisome lytic or sclerotic lesions. Minimal grade 1 anterolisthesis of L4 on L5. Mild compression of the superior endplate of L1, unchanged. IMPRESSION: 1. Slight enlargement of bilateral axillary and subpectoral lymph nodes. Otherwise, small to borderline enlarged lymph nodes in the chest, abdomen and pelvis are similar to the  prior exam. 2. Right lower lobe nodule is stable. 3. Aortic atherosclerosis (ICD10-I70.0). Coronary artery calcification. 4. Enlarged pulmonic trunk, indicative of pulmonary arterial hypertension. Electronically Signed   By: Lorin Picket M.D.   On: 01/09/2020 10:41   CT ABDOMEN PELVIS W CONTRAST  Result Date: 01/09/2020 CLINICAL DATA:  Hematologic malignancy, assess treatment response. Ongoing chemotherapy. EXAM: CT CHEST, ABDOMEN, AND PELVIS WITH CONTRAST TECHNIQUE: Multidetector CT imaging of the chest, abdomen and pelvis was performed following the standard protocol during bolus administration of intravenous contrast. CONTRAST:  177m OMNIPAQUE IOHEXOL 300 MG/ML  SOLN COMPARISON:  10/12/2019. FINDINGS: CT CHEST FINDINGS Cardiovascular: Right IJ Port-A-Cath terminates at the SVC RA junction. Atherosclerotic calcification of the aorta and coronary arteries. Pulmonic trunk and heart are enlarged. No pericardial effusion. Mediastinum/Nodes: Low internal jugular lymph nodes are not enlarged by CT size criteria, as before. No pathologically enlarged mediastinal or hilar lymph nodes. Enlarged axillary and subpectoral lymph nodes are seen bilaterally, measuring up to 1.5 cm on the right, increased from 10 mm on 10/12/2019. Esophagus is unremarkable. Lungs/Pleura: Posteromedial peripheral right lower lobe nodule measures 11 mm (10 x 12 mm, 7/89), similar to 10/12/2019. Lungs are otherwise clear. No pleural fluid. Airway is unremarkable. Musculoskeletal: No worrisome lytic or sclerotic lesions. CT ABDOMEN PELVIS FINDINGS Hepatobiliary: Liver and gallbladder are unremarkable. No biliary ductal dilatation. Pancreas: Negative. Spleen: Negative. Adrenals/Urinary Tract: Adrenal glands are unremarkable. Low-attenuation lesions in the right kidney measure up to 12 mm and are likely cysts although definitive characterization is limited due to size. Ureters are decompressed. Bladder is grossly unremarkable. Stomach/Bowel:  Stomach, small bowel, appendix and colon are unremarkable. Vascular/Lymphatic: Atherosclerotic calcification of the aorta without aneurysm. There are unenlarged lymph nodes in the abdominal peritoneal ligaments, small bowel mesentery and retroperitoneum. Largest lymph node is in the left common iliac station, measuring 10 mm (2/89), similar. Reproductive: Uterus is visualized.  No adnexal mass. Other: No free fluid.  Small bilateral inguinal hernias contain fat. Musculoskeletal: Mild degenerative changes in the spine. No worrisome lytic or sclerotic lesions. Minimal grade 1 anterolisthesis of L4 on L5. Mild compression of the superior endplate of L1, unchanged. IMPRESSION: 1. Slight enlargement of bilateral axillary and subpectoral lymph nodes. Otherwise, small to borderline enlarged lymph nodes in the chest, abdomen and pelvis are similar to the prior exam. 2. Right lower lobe nodule is stable. 3. Aortic atherosclerosis (ICD10-I70.0). Coronary artery calcification. 4. Enlarged pulmonic trunk, indicative of pulmonary arterial hypertension. Electronically Signed   By: MLorin PicketM.D.   On: 01/09/2020 10:41

## 2020-01-10 NOTE — Assessment & Plan Note (Signed)
She have no signs or symptoms of congestive heart failure She will continue medical management 

## 2020-01-12 ENCOUNTER — Other Ambulatory Visit: Payer: Self-pay | Admitting: Hematology and Oncology

## 2020-01-28 MED FILL — VALGANCICLOVIR 450 MG TAB: 450 | 30 days supply | Qty: 60 | Fill #5

## 2020-01-28 MED FILL — SULFAMETHOXAZOLE-TMP SS TAB: 400-80 | 30 days supply | Qty: 30 | Fill #5

## 2020-02-03 MED FILL — COPIKTRA 25 MG CAPS: 25 | 28 days supply | Qty: 56 | Fill #5

## 2020-02-21 ENCOUNTER — Inpatient Hospital Stay: Payer: BC Managed Care – PPO | Attending: Hematology and Oncology

## 2020-02-21 ENCOUNTER — Other Ambulatory Visit: Payer: Self-pay

## 2020-02-21 ENCOUNTER — Inpatient Hospital Stay: Payer: BC Managed Care – PPO | Admitting: Hematology and Oncology

## 2020-02-21 ENCOUNTER — Telehealth: Payer: Self-pay | Admitting: Hematology and Oncology

## 2020-02-21 ENCOUNTER — Inpatient Hospital Stay: Payer: BC Managed Care – PPO

## 2020-02-21 ENCOUNTER — Telehealth: Payer: Self-pay

## 2020-02-21 ENCOUNTER — Encounter: Payer: Self-pay | Admitting: Hematology and Oncology

## 2020-02-21 DIAGNOSIS — Z9221 Personal history of antineoplastic chemotherapy: Secondary | ICD-10-CM | POA: Diagnosis not present

## 2020-02-21 DIAGNOSIS — I428 Other cardiomyopathies: Secondary | ICD-10-CM | POA: Insufficient documentation

## 2020-02-21 DIAGNOSIS — C9110 Chronic lymphocytic leukemia of B-cell type not having achieved remission: Secondary | ICD-10-CM

## 2020-02-21 DIAGNOSIS — D702 Other drug-induced agranulocytosis: Secondary | ICD-10-CM | POA: Diagnosis not present

## 2020-02-21 DIAGNOSIS — Z79899 Other long term (current) drug therapy: Secondary | ICD-10-CM | POA: Insufficient documentation

## 2020-02-21 DIAGNOSIS — C9112 Chronic lymphocytic leukemia of B-cell type in relapse: Secondary | ICD-10-CM | POA: Diagnosis not present

## 2020-02-21 DIAGNOSIS — Z95828 Presence of other vascular implants and grafts: Secondary | ICD-10-CM

## 2020-02-21 DIAGNOSIS — D61818 Other pancytopenia: Secondary | ICD-10-CM | POA: Insufficient documentation

## 2020-02-21 DIAGNOSIS — Z452 Encounter for adjustment and management of vascular access device: Secondary | ICD-10-CM

## 2020-02-21 LAB — COMPREHENSIVE METABOLIC PANEL
ALT: 12 U/L (ref 0–44)
AST: 17 U/L (ref 15–41)
Albumin: 3.8 g/dL (ref 3.5–5.0)
Alkaline Phosphatase: 61 U/L (ref 38–126)
Anion gap: 10 (ref 5–15)
BUN: 11 mg/dL (ref 6–20)
CO2: 24 mmol/L (ref 22–32)
Calcium: 8.9 mg/dL (ref 8.9–10.3)
Chloride: 109 mmol/L (ref 98–111)
Creatinine, Ser: 0.8 mg/dL (ref 0.44–1.00)
GFR calc Af Amer: >60 mL/min (ref >60)
GFR calc non Af Amer: >60 mL/min (ref >60)
Glucose, Bld: 98 mg/dL (ref 70–99)
Potassium: 3.7 mmol/L (ref 3.5–5.1)
Sodium: 143 mmol/L (ref 135–145)
Total Bilirubin: 0.9 mg/dL (ref 0.3–1.2)
Total Protein: 6 g/dL — ABNORMAL LOW (ref 6.5–8.1)

## 2020-02-21 LAB — CBC WITH DIFFERENTIAL/PLATELET
Abs Immature Granulocytes: 0.04 10*3/uL (ref 0.00–0.07)
Basophils Absolute: 0.1 10*3/uL (ref 0.0–0.1)
Basophils Relative: 3 %
Eosinophils Absolute: 0.1 10*3/uL (ref 0.0–0.5)
Eosinophils Relative: 5 %
HCT: 38.5 % (ref 36.0–46.0)
Hemoglobin: 12.4 g/dL (ref 12.0–15.0)
Immature Granulocytes: 2 %
Lymphocytes Relative: 57 %
Lymphs Abs: 1.2 10*3/uL (ref 0.7–4.0)
MCH: 28.4 pg (ref 26.0–34.0)
MCHC: 32.2 g/dL (ref 30.0–36.0)
MCV: 88.3 fL (ref 80.0–100.0)
Monocytes Absolute: 0.1 10*3/uL (ref 0.1–1.0)
Monocytes Relative: 6 %
Neutro Abs: 0.6 10*3/uL — ABNORMAL LOW (ref 1.7–7.7)
Neutrophils Relative %: 27 %
Platelets: 181 10*3/uL (ref 150–400)
RBC: 4.36 MIL/uL (ref 3.87–5.11)
RDW: 13.2 % (ref 11.5–15.5)
WBC: 2.1 10*3/uL — ABNORMAL LOW (ref 4.0–10.5)
nRBC: 0 % (ref 0.0–0.2)

## 2020-02-21 MED ORDER — SODIUM CHLORIDE 0.9% FLUSH
10.0000 mL | Freq: Once | INTRAVENOUS | Status: AC
  Administered 2020-02-21: 10 mL
  Filled 2020-02-21: qty 10

## 2020-02-21 MED ORDER — HEPARIN SOD (PORK) LOCK FLUSH 100 UNIT/ML IV SOLN
500.0000 [IU] | Freq: Once | INTRAVENOUS | Status: AC
  Administered 2020-02-21: 500 [IU]
  Filled 2020-02-21: qty 5

## 2020-02-21 NOTE — Assessment & Plan Note (Signed)
She have no new side effects from treatment Her chronic pancytopenia is stable Review CT imaging study with the patient which shows stable disease control Even though radiologist commented that some of the subpectoral and axillary lymphadenopathy is likely enlarged, overall, this is considered a stable disease She will continue treatment as scheduled I will see her again in 6 weeks with port flushes blood work and physical exam I plan to repeat CT imaging in a few months, around July of this year

## 2020-02-21 NOTE — Assessment & Plan Note (Signed)
She have no signs or symptoms of congestive heart failure She will continue medical management 

## 2020-02-21 NOTE — Telephone Encounter (Signed)
Scheduled appt per 5/18 sch message - unable to reach pt .left message with appt date and time

## 2020-02-21 NOTE — Progress Notes (Signed)
[facility] OFFICE PROGRESS NOTE  Patient Care Team: Janith Lima, MD as PCP - General (Internal Medicine) Constance Haw, MD as PCP - Electrophysiology (Cardiology) Constance Haw, MD as Consulting Physician (Cardiology)  ASSESSMENT & PLAN:  CLL (chronic lymphocytic leukemia) ([place]) She have no new side effects from treatment Her chronic pancytopenia is stable Review CT imaging study with the patient which shows stable disease control Even though radiologist commented that some of the subpectoral and axillary lymphadenopathy is likely enlarged, overall, this is considered a stable disease She will continue treatment as scheduled I will see her again in 6 weeks with port flushes blood work and physical exam I plan to repeat CT imaging in a few months, around July of this year  Drug-induced neutropenia ([place]) This is likely due to recent treatment. The patient denies recent history of fevers, cough, chills, diarrhea or dysuria. She is asymptomatic from the leukopenia. I will observe for now.  We discussed neutropenic precaution  Nonischemic cardiomyopathy ([place]) She have no signs or symptoms of congestive heart failure She will continue medical management   No orders of the defined types were placed in this encounter.   All questions were answered. The patient knows to call the clinic with any problems, questions or concerns. The total time spent in the appointment was 20 minutes encounter with patients including review of chart and various tests results, discussions about plan of care and coordination of care plan   Heath Lark, MD 02/21/2020 7:30 PM  INTERVAL HISTORY: Please see below for problem oriented charting.  She returns for further follow-up She tolerated treatment well No recent infection, fever or chills No recent bleeding or exacerbation of congestive heart failure No recent new lymphadenopathy  SUMMARY OF ONCOLOGIC HISTORY: Oncology  History Overview Note  17p positive   CLL (chronic lymphocytic leukemia) ([place])  04/05/2015 Pathology Results   Accession: [id number] flow cytometry confirmed CLL. FISH was positive for p53 mutation   04/24/2015 Imaging   Extensive lymphadenopathy throughout the neck, chest (axilla), abdomen and pelvis, as detailed above, compatible with the reported clinical history of lymphoma. 2. Mild splenomegaly.   05/03/2015 - 08/27/2015 Chemotherapy   She started on Ibrutinib, discontinued prematurely when her prescription ran out   10/10/2015 - 05/29/2017 Chemotherapy   She was restarted back on Ibrutinib   11/13/2016 PET scan   Significant generalized reduction in size of numerous lymph nodes in the neck, chest, abdomen, and pelvis. Previously the activity of these nodes was low-level and in general a similar low-level activity is present today, significantly less than mediastinal blood pool activity, compatible with Deauville 2. 2. Coronary atherosclerosis. Mild cardiomegaly. 3. Mildly prominent endometrium for age without accentuated metabolic activity in the endometrium. Consider pelvic sonography for further characterization.   05/27/2017 PET scan   1. Progressive hypermetabolic adenopathy, primarily involving cervical, axillary, pelvic and inguinal lymph nodes bilaterally, consistent with progressive lymphoma. 2. No solid visceral organ or osseous involvement.   06/16/2017 - 08/25/2017 Chemotherapy   The patient had 3 cycles of Rituximab and Bendamustine   07/15/2017 - 07/19/2017 Hospital Admission   The patient was briefly admitted to the hospital due to infusion reaction to rituximab   09/18/2017 PET scan   1. Continued considerable adenopathy in the neck, chest, abdomen, and pelvis. This is generally stable in size but mildly reduced in activity compared to the prior exam. Current levels of activity primarily Deauville 3 and Deauville 4. No splenomegaly. 2. Diffuse new ground-glass  opacities in  the lungs with associated hypermetabolic activity. Some forms of lymphoma infiltration can rarely cause this pattern of diffuse ground-glass opacity and hypermetabolic activity. Differential diagnostic considerations might include atypical pneumonia such as mycoplasma, acute hypersensitivity pneumonitis, or acute eosinophilic pneumonia. Pulmonary hemorrhage seems less likely to cause this degree of accentuated metabolic activity.  3. Aortic Atherosclerosis (ICD10-I70.0). Coronary atherosclerosis.   09/24/2017 -  Chemotherapy   The patient had ventoclax for chemotherapy treatment.  Rituximab is added on 11/18/17 to 04/09/18, x 6 cycles   11/19/2017 PET scan   Overall mild interval decrease in hypermetabolic lymphadenopathy throughout the neck, chest, abdomen, and pelvis. No new or increased lymphadenopathy identified.  While there has been resolution of diffuse hypermetabolic bilateral ground-glass pulmonary opacity since prior study, there is a new 14 mm hypermetabolic pulmonary nodule in the posterior right lower lobe. Time course favors inflammatory or infectious etiology over neoplasm. Recommend continued follow-up by chest CT in 3 months.   02/16/2018 PET scan   1. Adenopathy in the neck, chest, and pelvis is stable to minimally reduced in size, and is moderately reduced in activity, primarily Deauville 2 disease today. There is a right common iliac lymph node qualifying as Deauville 3 disease which is stable in size but reduced in activity. 2. Stable size but reduced activity in a pulmonary nodule in the right lower lobe. If this represents a leukemic lesion then a corresponds to Deauville 4 disease. This lesion was not readily apparent on 09/22/2017 but was reported on the prior chest CT of 11/19/2017. 3. Other imaging findings of potential clinical significance: Mild thyroid goiter. Aortic Atherosclerosis (ICD10-I70.0). Coronary atherosclerosis. Mild cardiomegaly.   04/19/2018 PET scan   1.  Interval mild mixed metabolic changes, as detailed. Persistent mildly hypermetabolic bilateral axillary, mediastinal and bilateral pelvic adenopathy and mildly hypermetabolic right lower lobe pulmonary nodule compatible with lymphoproliferative disorder. Deauville 4 based on the subcarinal node. 2. Aortic Atherosclerosis (ICD10-I70.0).   06/29/2018 Procedure   Successful placement of a right internal jugular approach power injectable Port-A-Cath. The catheter is ready for immediate use.   07/26/2018 PET scan   1. Adenopathy primarily in the chest and pelvis as noted above, with size of the mildly enlarged lymph nodes stable to minimally increased, but with nodal activity generally decreased compared to the prior exam. Uptake in these nodes is primarily Deauville 2. 2. Aortic Atherosclerosis (ICD10-I70.0). Coronary atherosclerosis with mild cardiomegaly.   10/29/2018 PET scan   1. Relatively stable sized lymphadenopathy but interval increase in hypermetabolism when compared to the most recent prior PET-CT, as detailed above. 2. No new disease is identified. 3. Stable bilateral pulmonary nodules.   11/12/2018 - 02/02/2019 Chemotherapy   The patient had riTUXimab (RITUXAN) 800 mg in sodium chloride 0.9 % 170 mL infusion, 375 mg/m2 = 800 mg, Intravenous,  Once, 3 of 4 cycles Administration: 800 mg (11/12/2018), 800 mg (12/10/2018), 800 mg (01/06/2019)  for chemotherapy treatment.    01/11/2019 Imaging   1. Significant increased adenopathy in the chest, abdomen, and pelvis compared to the 10/28/2018 PET-CT. 2. The scattered tiny pulmonary nodules and larger single right lower lobe pulmonary nodule are all stable. 3. Other imaging findings of potential clinical significance: Aortic Atherosclerosis (ICD10-I70.0). Coronary atherosclerosis. Mild cardiomegaly. Mild diffuse thyroid prominence, stable. Mild left foraminal impingement at L4-5.   01/21/2019 Procedure   Successful ultrasound-guided core biopsies of  an enlarged right axillary lymph node.    01/21/2019 Pathology Results   Lymph node, needle/core biopsy, Right Axilla -  NON-HODGKIN B-CELL LYMPHOMA CONSISTENT WITH CHRONIC LYMPHOCYTIC LEUKEMIA/SMALL LYMPHOCYTIC LYMPHOMA - SEE COMMENT Microscopic Comment The biopsies are small core biopsies composed of a monotonous population of lymphocytes which are positive for CD20 without expression of cyclin-D1. The latter is important because it essentially rules out the possibility of mantle cell lymphoma. CD3 highlights a small population of T-cells. By flow cytometry, a kappa-restricted monoclonal B-cell population that expresses CD19, CD20, CD5, and CD23 comprises 89% of all lymphocytes. Overall, the features are consistent with chronic lymphocytic leukemia/small lymphocytic lymphoma.   01/31/2019 Echocardiogram    1. The left ventricle has normal systolic function, with an ejection fraction of 55-60%. The cavity size was normal. Left ventricular diastolic Doppler parameters are consistent with impaired relaxation. No evidence of left ventricular regional wall motion abnormalities. GLS -20%.  2. The right ventricle has normal systolic function. The cavity was normal. There is no increase in right ventricular wall thickness.  3. The aortic valve is tricuspid. Mild calcification of the aortic valve. No stenosis of the aortic valve.  4. The aortic root is normal in size and structure.  5. 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  6. No evidence of mitral valve stenosis. No significant mitral regurgitation.  7. Normal IVC size. No complete TR doppler jet so unable to estimate PA systolic pressure.   02/03/2019 - 07/24/2019 Chemotherapy   The patient had obinutuzumab and Acalabrutinib for chemotherapy treatment.    05/02/2019 Imaging   1. Interval decrease in size axillary and mediastinal adenopathy. 2. There are a few retroperitoneal lymph nodes which are mildly increased in size. The  majority of the retroperitoneal and pelvic lymph nodes are grossly similar when compared to prior exam. 3. Stable scattered pulmonary nodules.   07/22/2019 Imaging   1. Progressive disease within the neck, chest, abdomen, and pelvis, as evidenced by increase in adenopathy. 2. Similar bilateral pulmonary nodules. 3. Extensive right-sided colonic pneumatosis and small volume extracolonic intraperitoneal gas. Correlate with abdominal symptoms. Of note, pneumatosis has been described in patients on immunotherapy. 4. Age advanced coronary artery atherosclerosis. Recommend assessment of coronary risk factors and consideration of medical therapy. 5.  Aortic Atherosclerosis (ICD10-I70.0). 6. Similar mild L1 compression deformity. 7. Trace free pelvic fluid, similar. 8. Pulmonary artery enlargement suggests pulmonary arterial hypertension.   08/11/2019 -  Chemotherapy   The patient had duvelisib for chemotherapy treatment.     10/12/2019 Imaging   1. Slight enlargement of a high right paratracheal lymph node. Otherwise, lymph nodes in the axillary regions, abdomen and pelvis have decreased in size in the interval, consistent with treatment response. 2. Interval decrease in size and number of pulmonary nodules, indicative of metastatic disease. 3. There may be mild nonspecific diffuse esophageal wall thickening. 4. Aortic atherosclerosis (ICD10-I70.0). Coronary artery calcification. 5. Enlarged pulmonic trunk, indicative of arterial hypertension.   10/13/2019 -  Hospital Admission   She was admitted for severe dysphagia, dehydration and electrolytes abnormalities   01/09/2020 Imaging   1. Slight enlargement of bilateral axillary and subpectoral lymph nodes. Otherwise, small to borderline enlarged lymph nodes in the chest, abdomen and pelvis are similar to the prior exam. 2. Right lower lobe nodule is stable. 3. Aortic atherosclerosis (ICD10-I70.0). Coronary artery calcification. 4. Enlarged pulmonic  trunk, indicative of pulmonary arterial hypertension.     REVIEW OF SYSTEMS:   Constitutional: Denies fevers, chills or abnormal weight loss Eyes: Denies blurriness of vision Ears, nose, mouth, throat, and face: Denies mucositis or sore throat Respiratory: Denies  cough, dyspnea or wheezes Cardiovascular: Denies palpitation, chest discomfort or lower extremity swelling Gastrointestinal:  Denies nausea, heartburn or change in bowel habits Skin: Denies abnormal skin rashes Lymphatics: Denies new lymphadenopathy or easy bruising Neurological:Denies numbness, tingling or new weaknesses Behavioral/Psych: Mood is stable, no new changes  All other systems were reviewed with the patient and are negative.  I have reviewed the past medical history, past surgical history, social history and family history with the patient and they are unchanged from previous note.  ALLERGIES:  has No Known Allergies.  MEDICATIONS:  Current Outpatient Medications  Medication Sig Dispense Refill  . apixaban (ELIQUIS) 5 MG TABS tablet Take 1 tablet (5 mg total) by mouth 2 (two) times daily. 180 tablet 1  . Cholecalciferol (VITAMIN D-3) 1000 units CAPS Take 1,000 Units by mouth daily with breakfast.    . Duvelisib (COPIKTRA) 25 MG CAPS Take 25 mg by mouth 2 (two) times daily. Take with or without food, approximately 12 hours apart. 60 capsule 11  . lidocaine-prilocaine (EMLA) cream Apply 1 application topically as needed. (Patient taking differently: Apply 1 application topically as needed (acess port). ) 30 g 6  . metoprolol succinate (TOPROL-XL) 100 MG 24 hr tablet TAKE 1 AND 1/2 TABLETS BY MOUTH EVERY DAY. TAKE WITH FOOD OR RIGHT AFTER A MEAL 135 tablet 2  . ondansetron (ZOFRAN-ODT) 8 MG disintegrating tablet Take 1 tablet (8 mg total) by mouth every 8 (eight) hours as needed for nausea or vomiting. 20 tablet 0  . pantoprazole (PROTONIX) 40 MG tablet TAKE 1 TABLET BY MOUTH TWICE A DAY 180 tablet 1  .  sacubitril-valsartan (ENTRESTO) 49-51 MG Take 1 tablet by mouth 2 (two) times daily. 60 tablet 3  . sulfamethoxazole-trimethoprim (BACTRIM) 400-80 MG tablet Take 1 tablet by mouth daily. 30 tablet 6  . valGANciclovir (VALCYTE) 450 MG tablet Take 2 tablets (900 mg total) by mouth daily. 60 tablet 6   No current facility-administered medications for this visit.    PHYSICAL EXAMINATION: ECOG PERFORMANCE STATUS: 0 - Asymptomatic  Vitals:   02/21/20 0918  BP: (!) 144/80  Pulse: 69  Resp: 18  Temp: 98.2 F (36.8 C)  SpO2: 100%   Filed Weights   02/21/20 0918  Weight: 191 lb (86.6 kg)    GENERAL:alert, no distress and comfortable SKIN: skin color, texture, turgor are normal, no rashes or significant lesions EYES: normal, Conjunctiva are pink and non-injected, sclera clear OROPHARYNX:no exudate, no erythema and lips, buccal mucosa, and tongue normal  NECK: supple, thyroid normal size, non-tender, without nodularity LYMPH:  no palpable lymphadenopathy in the cervical, axillary or inguinal LUNGS: clear to auscultation and percussion with normal breathing effort HEART: regular rate & rhythm and no murmurs and no lower extremity edema ABDOMEN:abdomen soft, non-tender and normal bowel sounds Musculoskeletal:no cyanosis of digits and no clubbing  NEURO: alert & oriented x 3 with fluent speech, no focal motor/sensory deficits  LABORATORY DATA:  I have reviewed the data as listed    Component Value Date/Time   NA 143 02/21/2020 0856   NA 140 10/09/2017 1416   K 3.7 02/21/2020 0856   K 4.4 10/09/2017 1416   CL 109 02/21/2020 0856   CL 105 01/18/2013 0912   CO2 24 02/21/2020 0856   CO2 25 10/09/2017 1416   GLUCOSE 98 02/21/2020 0856   GLUCOSE 117 10/09/2017 1416   GLUCOSE 83 01/18/2013 0912   BUN 11 02/21/2020 0856   BUN 11.1 10/09/2017 1416   CREATININE 0.80 02/21/2020  0856   CREATININE 0.72 11/01/2018 0903   CREATININE 0.8 10/09/2017 1416   CALCIUM 8.9 02/21/2020 0856    CALCIUM 9.6 10/09/2017 1416   PROT 6.0 (L) 02/21/2020 0856   PROT 6.4 10/09/2017 1416   ALBUMIN 3.8 02/21/2020 0856   ALBUMIN 3.4 (L) 10/09/2017 1416   AST 17 02/21/2020 0856   AST 17 11/01/2018 0903   AST 18 10/09/2017 1416   ALT 12 02/21/2020 0856   ALT 15 11/01/2018 0903   ALT 19 10/09/2017 1416   ALKPHOS 61 02/21/2020 0856   ALKPHOS 69 10/09/2017 1416   BILITOT 0.9 02/21/2020 0856   BILITOT 1.3 (H) 11/01/2018 0903   BILITOT 0.57 10/09/2017 1416   GFRNONAA >60 02/21/2020 0856   GFRNONAA >60 11/01/2018 0903   GFRAA >60 02/21/2020 0856   GFRAA >60 11/01/2018 0903    No results found for: SPEP, UPEP  Lab Results  Component Value Date   WBC 2.1 (L) 02/21/2020   NEUTROABS 0.6 (L) 02/21/2020   HGB 12.4 02/21/2020   HCT 38.5 02/21/2020   MCV 88.3 02/21/2020   PLT 181 02/21/2020      Chemistry      Component Value Date/Time   NA 143 02/21/2020 0856   NA 140 10/09/2017 1416   K 3.7 02/21/2020 0856   K 4.4 10/09/2017 1416   CL 109 02/21/2020 0856   CL 105 01/18/2013 0912   CO2 24 02/21/2020 0856   CO2 25 10/09/2017 1416   BUN 11 02/21/2020 0856   BUN 11.1 10/09/2017 1416   CREATININE 0.80 02/21/2020 0856   CREATININE 0.72 11/01/2018 0903   CREATININE 0.8 10/09/2017 1416      Component Value Date/Time   CALCIUM 8.9 02/21/2020 0856   CALCIUM 9.6 10/09/2017 1416   ALKPHOS 61 02/21/2020 0856   ALKPHOS 69 10/09/2017 1416   AST 17 02/21/2020 0856   AST 17 11/01/2018 0903   AST 18 10/09/2017 1416   ALT 12 02/21/2020 0856   ALT 15 11/01/2018 0903   ALT 19 10/09/2017 1416   BILITOT 0.9 02/21/2020 0856   BILITOT 1.3 (H) 11/01/2018 0903   BILITOT 0.57 10/09/2017 1416

## 2020-02-21 NOTE — Telephone Encounter (Signed)
Audrey Gross, the resident pharmacist is working with her

## 2020-02-21 NOTE — Assessment & Plan Note (Signed)
This is likely due to recent treatment. The patient denies recent history of fevers, cough, chills, diarrhea or dysuria. She is asymptomatic from the leukopenia. I will observe for now.  We discussed neutropenic precaution

## 2020-02-21 NOTE — Telephone Encounter (Signed)
[facility] has been attempting to reach patient since 01/23/20 to schedule her refill and pickup/delivery of [employer]. They have been unable to reach patient. Patient has an appointment today. Please have patient reach out to specialty pharmacy for refill [phone].  [facility] Phone [phone] Fax [phone] 02/21/2020 7:52 AM

## 2020-03-02 MED FILL — VALGANCICLOVIR 450 MG TAB: 450 | 30 days supply | Qty: 60 | Fill #6

## 2020-03-02 MED FILL — SULFAMETHOXAZOLE-TMP SS TAB: 400-80 | 30 days supply | Qty: 30 | Fill #6

## 2020-03-02 MED FILL — COPIKTRA 25 MG CAPS: 25 | 28 days supply | Qty: 56 | Fill #6

## 2020-03-25 ENCOUNTER — Other Ambulatory Visit: Payer: Self-pay | Admitting: Cardiology

## 2020-03-26 MED ORDER — ENTRESTO 49-51 MG PO TABS: 1.0000 | ORAL_TABLET | Freq: Two times a day (BID) | ORAL | 1 refills | Status: AC

## 2020-03-28 ENCOUNTER — Other Ambulatory Visit: Payer: Self-pay | Admitting: Hematology and Oncology

## 2020-03-28 DIAGNOSIS — C9110 Chronic lymphocytic leukemia of B-cell type not having achieved remission: Secondary | ICD-10-CM

## 2020-04-02 ENCOUNTER — Inpatient Hospital Stay: Payer: BC Managed Care – PPO

## 2020-04-02 ENCOUNTER — Encounter: Payer: Self-pay | Admitting: Hematology and Oncology

## 2020-04-02 ENCOUNTER — Other Ambulatory Visit: Payer: Self-pay

## 2020-04-02 ENCOUNTER — Inpatient Hospital Stay: Payer: BC Managed Care – PPO | Attending: Hematology and Oncology | Admitting: Hematology and Oncology

## 2020-04-02 VITALS — BP 142/86 | HR 64 | Temp 98.4°F | Resp 18 | Ht 67.0 in | Wt 194.0 lb

## 2020-04-02 DIAGNOSIS — D61818 Other pancytopenia: Secondary | ICD-10-CM | POA: Insufficient documentation

## 2020-04-02 DIAGNOSIS — Z79899 Other long term (current) drug therapy: Secondary | ICD-10-CM | POA: Insufficient documentation

## 2020-04-02 DIAGNOSIS — C9110 Chronic lymphocytic leukemia of B-cell type not having achieved remission: Secondary | ICD-10-CM

## 2020-04-02 DIAGNOSIS — C9112 Chronic lymphocytic leukemia of B-cell type in relapse: Secondary | ICD-10-CM | POA: Diagnosis not present

## 2020-04-02 DIAGNOSIS — I428 Other cardiomyopathies: Secondary | ICD-10-CM | POA: Diagnosis not present

## 2020-04-02 DIAGNOSIS — Z452 Encounter for adjustment and management of vascular access device: Secondary | ICD-10-CM

## 2020-04-02 DIAGNOSIS — Z95828 Presence of other vascular implants and grafts: Secondary | ICD-10-CM

## 2020-04-02 DIAGNOSIS — D702 Other drug-induced agranulocytosis: Secondary | ICD-10-CM | POA: Diagnosis not present

## 2020-04-02 LAB — COMPREHENSIVE METABOLIC PANEL
ALT: 18 U/L (ref 0–44)
AST: 22 U/L (ref 15–41)
Albumin: 3.9 g/dL (ref 3.5–5.0)
Alkaline Phosphatase: 60 U/L (ref 38–126)
Anion gap: 12 (ref 5–15)
BUN: 11 mg/dL (ref 6–20)
CO2: 22 mmol/L (ref 22–32)
Calcium: 8.8 mg/dL — ABNORMAL LOW (ref 8.9–10.3)
Chloride: 110 mmol/L (ref 98–111)
Creatinine, Ser: 0.8 mg/dL (ref 0.44–1.00)
GFR calc Af Amer: >60 mL/min (ref >60)
GFR calc non Af Amer: >60 mL/min (ref >60)
Glucose, Bld: 106 mg/dL — ABNORMAL HIGH (ref 70–99)
Potassium: 3.3 mmol/L — ABNORMAL LOW (ref 3.5–5.1)
Sodium: 144 mmol/L (ref 135–145)
Total Bilirubin: 0.8 mg/dL (ref 0.3–1.2)
Total Protein: 6 g/dL — ABNORMAL LOW (ref 6.5–8.1)

## 2020-04-02 LAB — CBC WITH DIFFERENTIAL/PLATELET
Abs Immature Granulocytes: 0.16 10*3/uL — ABNORMAL HIGH (ref 0.00–0.07)
Basophils Absolute: 0.1 10*3/uL (ref 0.0–0.1)
Basophils Relative: 3 %
Eosinophils Absolute: 0.2 10*3/uL (ref 0.0–0.5)
Eosinophils Relative: 6 %
HCT: 36.2 % (ref 36.0–46.0)
Hemoglobin: 12 g/dL (ref 12.0–15.0)
Immature Granulocytes: 6 %
Lymphocytes Relative: 45 %
Lymphs Abs: 1.3 10*3/uL (ref 0.7–4.0)
MCH: 29.2 pg (ref 26.0–34.0)
MCHC: 33.1 g/dL (ref 30.0–36.0)
MCV: 88.1 fL (ref 80.0–100.0)
Monocytes Absolute: 0.4 10*3/uL (ref 0.1–1.0)
Monocytes Relative: 12 %
Neutro Abs: 0.8 10*3/uL — ABNORMAL LOW (ref 1.7–7.7)
Neutrophils Relative %: 28 %
Platelets: 187 10*3/uL (ref 150–400)
RBC: 4.11 MIL/uL (ref 3.87–5.11)
RDW: 12.9 % (ref 11.5–15.5)
WBC: 2.9 10*3/uL — ABNORMAL LOW (ref 4.0–10.5)
nRBC: 0 % (ref 0.0–0.2)

## 2020-04-02 MED ORDER — HEPARIN SOD (PORK) LOCK FLUSH 100 UNIT/ML IV SOLN
500.0000 [IU] | Freq: Once | INTRAVENOUS | Status: AC
  Administered 2020-04-02: 500 [IU]
  Filled 2020-04-02: qty 5

## 2020-04-02 MED ORDER — SODIUM CHLORIDE 0.9% FLUSH
10.0000 mL | Freq: Once | INTRAVENOUS | Status: AC
  Administered 2020-04-02: 10 mL
  Filled 2020-04-02: qty 10

## 2020-04-02 MED FILL — SULFAMETHOXAZOLE-TMP SS TAB: 400-80 | 30 days supply | Qty: 30 | Fill #0

## 2020-04-02 MED FILL — VALGANCICLOVIR 450 MG TAB: 450 | 30 days supply | Qty: 60 | Fill #0

## 2020-04-02 MED FILL — COPIKTRA 25 MG CAPS: 25 | 28 days supply | Qty: 56 | Fill #7

## 2020-04-02 NOTE — Progress Notes (Signed)
[facility] OFFICE PROGRESS NOTE  Patient Care Team: Janith Lima, MD as PCP - General (Internal Medicine) Constance Haw, MD as PCP - Electrophysiology (Cardiology) Constance Haw, MD as Consulting Physician (Cardiology)  ASSESSMENT & PLAN:  CLL (chronic lymphocytic leukemia) ([place]) She have no new side effects from treatment Her chronic pancytopenia is stable Clinically, I did not detect new palpable lymphadenopathy but it is prudent to keep an eye on the lymphadenopathy seen on prior CT imaging I plan to repeat CT scan again next month for further follow-up  Drug-induced neutropenia ([place]) This is likely due to recent treatment. The patient denies recent history of fevers, cough, chills, diarrhea or dysuria. She is asymptomatic from the leukopenia. I will observe for now.  We discussed neutropenic precaution She will continue to take antimicrobial therapy  Nonischemic cardiomyopathy ([place]) She have no signs or symptoms of congestive heart failure She will continue medical management   Orders Placed This Encounter  Procedures  . CT ABDOMEN PELVIS W CONTRAST    Standing Status:   Future    Standing Expiration Date:   04/02/2021    Order Specific Question:   If indicated for the ordered procedure, I authorize the administration of contrast media per Radiology protocol    Answer:   Yes    Order Specific Question:   Preferred imaging location?    Answer:   [facility]    Order Specific Question:   Radiology Contrast Protocol - do NOT remove file path    Answer:   \\[email or web address]    Order Specific Question:   Is patient pregnant?    Answer:   No  . CT CHEST W CONTRAST    Standing Status:   Future    Standing Expiration Date:   04/02/2021    Order Specific Question:   If indicated for the ordered procedure, I authorize the administration of contrast media per Radiology protocol    Answer:   Yes    Order Specific  Question:   Preferred imaging location?    Answer:   [facility]    Order Specific Question:   Radiology Contrast Protocol - do NOT remove file path    Answer:   \\[email or web address]    Order Specific Question:   Is patient pregnant?    Answer:   No    All questions were answered. The patient knows to call the clinic with any problems, questions or concerns. The total time spent in the appointment was 20 minutes encounter with patients including review of chart and various tests results, discussions about plan of care and coordination of care plan   Heath Lark, MD 04/02/2020 9:45 AM  INTERVAL HISTORY: Please see below for problem oriented charting. She returns for further follow-up She tolerated Covid vaccination without difficulties No new lymphadenopathy Her appetite is fair No recent exacerbation of congestive heart failure No recent infection, fever or chills  SUMMARY OF ONCOLOGIC HISTORY: Oncology History Overview Note  17p positive   CLL (chronic lymphocytic leukemia) ([place])  04/05/2015 Pathology Results   Accession: [id number] flow cytometry confirmed CLL. FISH was positive for p53 mutation   04/24/2015 Imaging   Extensive lymphadenopathy throughout the neck, chest (axilla), abdomen and pelvis, as detailed above, compatible with the reported clinical history of lymphoma. 2. Mild splenomegaly.   05/03/2015 - 08/27/2015 Chemotherapy   She started on Ibrutinib, discontinued prematurely when her prescription ran out   10/10/2015 - 05/29/2017 Chemotherapy   She was  restarted back on Ibrutinib   11/13/2016 PET scan   Significant generalized reduction in size of numerous lymph nodes in the neck, chest, abdomen, and pelvis. Previously the activity of these nodes was low-level and in general a similar low-level activity is present today, significantly less than mediastinal blood pool activity, compatible with Deauville 2. 2. Coronary atherosclerosis.  Mild cardiomegaly. 3. Mildly prominent endometrium for age without accentuated metabolic activity in the endometrium. Consider pelvic sonography for further characterization.   05/27/2017 PET scan   1. Progressive hypermetabolic adenopathy, primarily involving cervical, axillary, pelvic and inguinal lymph nodes bilaterally, consistent with progressive lymphoma. 2. No solid visceral organ or osseous involvement.   06/16/2017 - 08/25/2017 Chemotherapy   The patient had 3 cycles of Rituximab and Bendamustine   07/15/2017 - 07/19/2017 Hospital Admission   The patient was briefly admitted to the hospital due to infusion reaction to rituximab   09/18/2017 PET scan   1. Continued considerable adenopathy in the neck, chest, abdomen, and pelvis. This is generally stable in size but mildly reduced in activity compared to the prior exam. Current levels of activity primarily Deauville 3 and Deauville 4. No splenomegaly. 2. Diffuse new ground-glass opacities in the lungs with associated hypermetabolic activity. Some forms of lymphoma infiltration can rarely cause this pattern of diffuse ground-glass opacity and hypermetabolic activity. Differential diagnostic considerations might include atypical pneumonia such as mycoplasma, acute hypersensitivity pneumonitis, or acute eosinophilic pneumonia. Pulmonary hemorrhage seems less likely to cause this degree of accentuated metabolic activity.  3. Aortic Atherosclerosis (ICD10-I70.0). Coronary atherosclerosis.   09/24/2017 -  Chemotherapy   The patient had ventoclax for chemotherapy treatment.  Rituximab is added on 11/18/17 to 04/09/18, x 6 cycles   11/19/2017 PET scan   Overall mild interval decrease in hypermetabolic lymphadenopathy throughout the neck, chest, abdomen, and pelvis. No new or increased lymphadenopathy identified.  While there has been resolution of diffuse hypermetabolic bilateral ground-glass pulmonary opacity since prior study, there is a new 14  mm hypermetabolic pulmonary nodule in the posterior right lower lobe. Time course favors inflammatory or infectious etiology over neoplasm. Recommend continued follow-up by chest CT in 3 months.   02/16/2018 PET scan   1. Adenopathy in the neck, chest, and pelvis is stable to minimally reduced in size, and is moderately reduced in activity, primarily Deauville 2 disease today. There is a right common iliac lymph node qualifying as Deauville 3 disease which is stable in size but reduced in activity. 2. Stable size but reduced activity in a pulmonary nodule in the right lower lobe. If this represents a leukemic lesion then a corresponds to Deauville 4 disease. This lesion was not readily apparent on 09/22/2017 but was reported on the prior chest CT of 11/19/2017. 3. Other imaging findings of potential clinical significance: Mild thyroid goiter. Aortic Atherosclerosis (ICD10-I70.0). Coronary atherosclerosis. Mild cardiomegaly.   04/19/2018 PET scan   1. Interval mild mixed metabolic changes, as detailed. Persistent mildly hypermetabolic bilateral axillary, mediastinal and bilateral pelvic adenopathy and mildly hypermetabolic right lower lobe pulmonary nodule compatible with lymphoproliferative disorder. Deauville 4 based on the subcarinal node. 2. Aortic Atherosclerosis (ICD10-I70.0).   06/29/2018 Procedure   Successful placement of a right internal jugular approach power injectable Port-A-Cath. The catheter is ready for immediate use.   07/26/2018 PET scan   1. Adenopathy primarily in the chest and pelvis as noted above, with size of the mildly enlarged lymph nodes stable to minimally increased, but with nodal activity generally decreased compared to the prior  exam. Uptake in these nodes is primarily Deauville 2. 2. Aortic Atherosclerosis (ICD10-I70.0). Coronary atherosclerosis with mild cardiomegaly.   10/29/2018 PET scan   1. Relatively stable sized lymphadenopathy but interval increase in  hypermetabolism when compared to the most recent prior PET-CT, as detailed above. 2. No new disease is identified. 3. Stable bilateral pulmonary nodules.   11/12/2018 - 02/02/2019 Chemotherapy   The patient had riTUXimab (RITUXAN) 800 mg in sodium chloride 0.9 % 170 mL infusion, 375 mg/m2 = 800 mg, Intravenous,  Once, 3 of 4 cycles Administration: 800 mg (11/12/2018), 800 mg (12/10/2018), 800 mg (01/06/2019)  for chemotherapy treatment.    01/11/2019 Imaging   1. Significant increased adenopathy in the chest, abdomen, and pelvis compared to the 10/28/2018 PET-CT. 2. The scattered tiny pulmonary nodules and larger single right lower lobe pulmonary nodule are all stable. 3. Other imaging findings of potential clinical significance: Aortic Atherosclerosis (ICD10-I70.0). Coronary atherosclerosis. Mild cardiomegaly. Mild diffuse thyroid prominence, stable. Mild left foraminal impingement at L4-5.   01/21/2019 Procedure   Successful ultrasound-guided core biopsies of an enlarged right axillary lymph node.    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 The latter is important because it essentially rules out the possibility of mantle cell lymphoma. CD3 highlights a small population of T-cells. By flow cytometry, a kappa-restricted monoclonal B-cell population that expresses CD19, CD20, CD5, and CD23 comprises 89% of all lymphocytes. Overall, the features are consistent with chronic lymphocytic leukemia/small lymphocytic lymphoma.   01/31/2019 Echocardiogram    1. The left ventricle has normal systolic function, with an ejection fraction of 55-60%. The cavity size was normal. Left ventricular  diastolic Doppler parameters are consistent with impaired relaxation. No evidence of left ventricular regional wall motion abnormalities. GLS -20%.  2. The right ventricle has normal systolic function. The cavity was normal. There is no increase in right ventricular wall thickness.  3. The aortic valve is tricuspid. Mild calcification of the aortic valve. No stenosis of the aortic valve.  4. The aortic root is normal in size and structure.  5. 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  6. No evidence of mitral valve stenosis. No significant mitral regurgitation.  7. Normal IVC size. No complete TR doppler jet so unable to estimate PA systolic pressure.   02/03/2019 - 07/24/2019 Chemotherapy   The patient had obinutuzumab and Acalabrutinib for chemotherapy treatment.    05/02/2019 Imaging   1. Interval decrease in size axillary and mediastinal adenopathy. 2. There are a few retroperitoneal lymph nodes which are mildly increased in size. The majority of the retroperitoneal and pelvic lymph nodes are grossly similar when compared to prior exam. 3. Stable scattered pulmonary nodules.   07/22/2019 Imaging   1. Progressive disease within the neck, chest, abdomen, and pelvis, as evidenced by increase in adenopathy. 2. Similar bilateral pulmonary nodules. 3. Extensive right-sided colonic pneumatosis and small volume extracolonic intraperitoneal gas. Correlate with abdominal symptoms. Of note, pneumatosis has been described in patients on immunotherapy. 4. Age advanced coronary artery atherosclerosis. Recommend assessment of coronary risk factors and consideration of medical therapy. 5.  Aortic Atherosclerosis (ICD10-I70.0). 6. Similar mild L1 compression deformity. 7. Trace free pelvic fluid, similar. 8. Pulmonary artery enlargement suggests pulmonary arterial hypertension.  08/11/2019 -  Chemotherapy   The patient had duvelisib for chemotherapy treatment.     10/12/2019 Imaging    1. Slight enlargement of a high right paratracheal lymph node. Otherwise, lymph nodes in the axillary regions, abdomen and pelvis have decreased in size in the interval, consistent with treatment response. 2. Interval decrease in size and number of pulmonary nodules, indicative of metastatic disease. 3. There may be mild nonspecific diffuse esophageal wall thickening. 4. Aortic atherosclerosis (ICD10-I70.0). Coronary artery calcification. 5. Enlarged pulmonic trunk, indicative of arterial hypertension.   10/13/2019 -  Hospital Admission   She was admitted for severe dysphagia, dehydration and electrolytes abnormalities   01/09/2020 Imaging   1. Slight enlargement of bilateral axillary and subpectoral lymph nodes. Otherwise, small to borderline enlarged lymph nodes in the chest, abdomen and pelvis are similar to the prior exam. 2. Right lower lobe nodule is stable. 3. Aortic atherosclerosis (ICD10-I70.0). Coronary artery calcification. 4. Enlarged pulmonic trunk, indicative of pulmonary arterial hypertension.     REVIEW OF SYSTEMS:   Constitutional: Denies fevers, chills or abnormal weight loss Eyes: Denies blurriness of vision Ears, nose, mouth, throat, and face: Denies mucositis or sore throat Respiratory: Denies cough, dyspnea or wheezes Cardiovascular: Denies palpitation, chest discomfort or lower extremity swelling Gastrointestinal:  Denies nausea, heartburn or change in bowel habits Skin: Denies abnormal skin rashes Lymphatics: Denies new lymphadenopathy or easy bruising Neurological:Denies numbness, tingling or new weaknesses Behavioral/Psych: Mood is stable, no new changes  All other systems were reviewed with the patient and are negative.  I have reviewed the past medical history, past surgical history, social history and family history with the patient and they are unchanged from previous note.  ALLERGIES:  has No Known Allergies.  MEDICATIONS:  Current Outpatient  Medications  Medication Sig Dispense Refill  . apixaban (ELIQUIS) 5 MG TABS tablet Take 1 tablet (5 mg total) by mouth 2 (two) times daily. 180 tablet 1  . Cholecalciferol (VITAMIN D-3) 1000 units CAPS Take 1,000 Units by mouth daily with breakfast.    . Duvelisib (COPIKTRA) 25 MG CAPS Take 25 mg by mouth 2 (two) times daily. Take with or without food, approximately 12 hours apart. 60 capsule 11  . lidocaine-prilocaine (EMLA) cream Apply 1 application topically as needed. (Patient taking differently: Apply 1 application topically as needed (acess port). ) 30 g 6  . metoprolol succinate (TOPROL-XL) 100 MG 24 hr tablet TAKE 1 AND 1/2 TABLETS BY MOUTH EVERY DAY. TAKE WITH FOOD OR RIGHT AFTER A MEAL 135 tablet 2  . ondansetron (ZOFRAN-ODT) 8 MG disintegrating tablet Take 1 tablet (8 mg total) by mouth every 8 (eight) hours as needed for nausea or vomiting. 20 tablet 0  . pantoprazole (PROTONIX) 40 MG tablet TAKE 1 TABLET BY MOUTH TWICE A DAY 180 tablet 1  . sacubitril-valsartan (ENTRESTO) 49-51 MG Take 1 tablet by mouth 2 (two) times daily. 180 tablet 1  . sulfamethoxazole-trimethoprim (BACTRIM) 400-80 MG tablet TAKE 1 TABLET BY MOUTH DAILY. 30 tablet 6  . valGANciclovir (VALCYTE) 450 MG tablet TAKE 2 TABLETS (900 MG TOTAL) BY MOUTH DAILY. 60 tablet 6   No current facility-administered medications for this visit.    PHYSICAL EXAMINATION: ECOG PERFORMANCE STATUS: 1 - Symptomatic but completely ambulatory  Vitals:   04/02/20 0929  BP: (!) 142/86  Pulse: 64  Resp: 18  Temp: 98.4 F (36.9 C)  SpO2: 100%   Filed Weights   04/02/20 0929  Weight: 194 lb (88 kg)    GENERAL:alert,  no distress and comfortable SKIN: skin color, texture, turgor are normal, no rashes or significant lesions EYES: normal, Conjunctiva are pink and non-injected, sclera clear OROPHARYNX:no exudate, no erythema and lips, buccal mucosa, and tongue normal  NECK: supple, thyroid normal size, non-tender, without  nodularity LYMPH:  no palpable lymphadenopathy in the cervical, axillary or inguinal LUNGS: clear to auscultation and percussion with normal breathing effort HEART: regular rate & rhythm and no murmurs and no lower extremity edema ABDOMEN:abdomen soft, non-tender and normal bowel sounds Musculoskeletal:no cyanosis of digits and no clubbing  NEURO: alert & oriented x 3 with fluent speech, no focal motor/sensory deficits  LABORATORY DATA:  I have reviewed the data as listed    Component Value Date/Time   NA 143 02/21/2020 0856   NA 140 10/09/2017 1416   K 3.7 02/21/2020 0856   K 4.4 10/09/2017 1416   CL 109 02/21/2020 0856   CL 105 01/18/2013 0912   CO2 24 02/21/2020 0856   CO2 25 10/09/2017 1416   GLUCOSE 98 02/21/2020 0856   GLUCOSE 117 10/09/2017 1416   GLUCOSE 83 01/18/2013 0912   BUN 11 02/21/2020 0856   BUN 11.1 10/09/2017 1416   CREATININE 0.80 02/21/2020 0856   CREATININE 0.72 11/01/2018 0903   CREATININE 0.8 10/09/2017 1416   CALCIUM 8.9 02/21/2020 0856   CALCIUM 9.6 10/09/2017 1416   PROT 6.0 (L) 02/21/2020 0856   PROT 6.4 10/09/2017 1416   ALBUMIN 3.8 02/21/2020 0856   ALBUMIN 3.4 (L) 10/09/2017 1416   AST 17 02/21/2020 0856   AST 17 11/01/2018 0903   AST 18 10/09/2017 1416   ALT 12 02/21/2020 0856   ALT 15 11/01/2018 0903   ALT 19 10/09/2017 1416   ALKPHOS 61 02/21/2020 0856   ALKPHOS 69 10/09/2017 1416   BILITOT 0.9 02/21/2020 0856   BILITOT 1.3 (H) 11/01/2018 0903   BILITOT 0.57 10/09/2017 1416   GFRNONAA >60 02/21/2020 0856   GFRNONAA >60 11/01/2018 0903   GFRAA >60 02/21/2020 0856   GFRAA >60 11/01/2018 0903    No results found for: SPEP, UPEP  Lab Results  Component Value Date   WBC 2.9 (L) 04/02/2020   NEUTROABS PENDING 04/02/2020   HGB 12.0 04/02/2020   HCT 36.2 04/02/2020   MCV 88.1 04/02/2020   PLT 187 04/02/2020      Chemistry      Component Value Date/Time   NA 143 02/21/2020 0856   NA 140 10/09/2017 1416   K 3.7 02/21/2020 0856    K 4.4 10/09/2017 1416   CL 109 02/21/2020 0856   CL 105 01/18/2013 0912   CO2 24 02/21/2020 0856   CO2 25 10/09/2017 1416   BUN 11 02/21/2020 0856   BUN 11.1 10/09/2017 1416   CREATININE 0.80 02/21/2020 0856   CREATININE 0.72 11/01/2018 0903   CREATININE 0.8 10/09/2017 1416      Component Value Date/Time   CALCIUM 8.9 02/21/2020 0856   CALCIUM 9.6 10/09/2017 1416   ALKPHOS 61 02/21/2020 0856   ALKPHOS 69 10/09/2017 1416   AST 17 02/21/2020 0856   AST 17 11/01/2018 0903   AST 18 10/09/2017 1416   ALT 12 02/21/2020 0856   ALT 15 11/01/2018 0903   ALT 19 10/09/2017 1416   BILITOT 0.9 02/21/2020 0856   BILITOT 1.3 (H) 11/01/2018 0903   BILITOT 0.57 10/09/2017 1416

## 2020-04-02 NOTE — Assessment & Plan Note (Signed)
She have no new side effects from treatment Her chronic pancytopenia is stable Clinically, I did not detect new palpable lymphadenopathy but it is prudent to keep an eye on the lymphadenopathy seen on prior CT imaging I plan to repeat CT scan again next month for further follow-up

## 2020-04-02 NOTE — Assessment & Plan Note (Signed)
This is likely due to recent treatment. The patient denies recent history of fevers, cough, chills, diarrhea or dysuria. She is asymptomatic from the leukopenia. I will observe for now.  We discussed neutropenic precaution She will continue to take antimicrobial therapy

## 2020-04-02 NOTE — Assessment & Plan Note (Signed)
She have no signs or symptoms of congestive heart failure She will continue medical management

## 2020-04-04 ENCOUNTER — Telehealth: Payer: Self-pay | Admitting: Hematology and Oncology

## 2020-04-04 NOTE — Telephone Encounter (Signed)
Scheduled per 6/28 sch msg. Called and left a msg, mailing appt letter and calendar printout

## 2020-04-16 ENCOUNTER — Ambulatory Visit (HOSPITAL_COMMUNITY)
Admission: RE | Admit: 2020-04-16 | Discharge: 2020-04-16 | Disposition: A | Discharge status: Home / Self Care | Payer: BC Managed Care – PPO | Source: Ambulatory Visit | Attending: Hematology and Oncology | Admitting: Hematology and Oncology

## 2020-04-16 ENCOUNTER — Encounter (HOSPITAL_COMMUNITY): Payer: Self-pay

## 2020-04-16 DIAGNOSIS — C9110 Chronic lymphocytic leukemia of B-cell type not having achieved remission: Secondary | ICD-10-CM | POA: Insufficient documentation

## 2020-04-16 MED ORDER — HEPARIN SOD (PORK) LOCK FLUSH 100 UNIT/ML IV SOLN
INTRAVENOUS | Status: AC
  Filled 2020-04-16: qty 5

## 2020-04-16 MED ORDER — HEPARIN SOD (PORK) LOCK FLUSH 100 UNIT/ML IV SOLN
500.0000 [IU] | Freq: Once | INTRAVENOUS | Status: AC
  Administered 2020-04-16: 500 [IU] via INTRAVENOUS

## 2020-04-16 MED ORDER — SODIUM CHLORIDE (PF) 0.9 % IJ SOLN
INTRAMUSCULAR | Status: AC
  Filled 2020-04-16: qty 50

## 2020-04-16 MED ORDER — IOHEXOL 300 MG/ML  SOLN
100.0000 mL | Freq: Once | INTRAMUSCULAR | Status: AC | PRN
  Administered 2020-04-16: 100 mL via INTRAVENOUS

## 2020-04-17 ENCOUNTER — Inpatient Hospital Stay: Payer: BC Managed Care – PPO

## 2020-04-17 ENCOUNTER — Other Ambulatory Visit: Payer: Self-pay

## 2020-04-17 ENCOUNTER — Telehealth: Payer: Self-pay

## 2020-04-17 ENCOUNTER — Inpatient Hospital Stay: Payer: BC Managed Care – PPO | Attending: Hematology and Oncology | Admitting: Hematology and Oncology

## 2020-04-17 VITALS — BP 141/72 | HR 55 | Temp 98.2°F | Resp 18 | Ht 67.0 in | Wt 196.0 lb

## 2020-04-17 DIAGNOSIS — Z7901 Long term (current) use of anticoagulants: Secondary | ICD-10-CM | POA: Diagnosis not present

## 2020-04-17 DIAGNOSIS — Z79899 Other long term (current) drug therapy: Secondary | ICD-10-CM | POA: Insufficient documentation

## 2020-04-17 DIAGNOSIS — C9112 Chronic lymphocytic leukemia of B-cell type in relapse: Secondary | ICD-10-CM | POA: Insufficient documentation

## 2020-04-17 DIAGNOSIS — Z7189 Other specified counseling: Secondary | ICD-10-CM

## 2020-04-17 DIAGNOSIS — C9110 Chronic lymphocytic leukemia of B-cell type not having achieved remission: Secondary | ICD-10-CM

## 2020-04-17 DIAGNOSIS — I2510 Atherosclerotic heart disease of native coronary artery without angina pectoris: Secondary | ICD-10-CM | POA: Insufficient documentation

## 2020-04-17 LAB — COMPREHENSIVE METABOLIC PANEL
ALT: 14 U/L (ref 0–44)
AST: 19 U/L (ref 15–41)
Albumin: 4.1 g/dL (ref 3.5–5.0)
Alkaline Phosphatase: 67 U/L (ref 38–126)
Anion gap: 11 (ref 5–15)
BUN: 13 mg/dL (ref 6–20)
CO2: 25 mmol/L (ref 22–32)
Calcium: 9.4 mg/dL (ref 8.9–10.3)
Chloride: 106 mmol/L (ref 98–111)
Creatinine, Ser: 0.86 mg/dL (ref 0.44–1.00)
GFR calc Af Amer: >60 mL/min (ref >60)
GFR calc non Af Amer: >60 mL/min (ref >60)
Glucose, Bld: 80 mg/dL (ref 70–99)
Potassium: 3.8 mmol/L (ref 3.5–5.1)
Sodium: 142 mmol/L (ref 135–145)
Total Bilirubin: 0.7 mg/dL (ref 0.3–1.2)
Total Protein: 6.7 g/dL (ref 6.5–8.1)

## 2020-04-17 LAB — CBC WITH DIFFERENTIAL/PLATELET
Abs Immature Granulocytes: 0 10*3/uL (ref 0.00–0.07)
Band Neutrophils: 6 %
Basophils Absolute: 0 10*3/uL (ref 0.0–0.1)
Basophils Relative: 0 %
Eosinophils Absolute: 0 10*3/uL (ref 0.0–0.5)
Eosinophils Relative: 1 %
HCT: 39.2 % (ref 36.0–46.0)
Hemoglobin: 12.6 g/dL (ref 12.0–15.0)
Lymphocytes Relative: 75 %
Lymphs Abs: 3.3 10*3/uL (ref 0.7–4.0)
MCH: 28.3 pg (ref 26.0–34.0)
MCHC: 32.1 g/dL (ref 30.0–36.0)
MCV: 87.9 fL (ref 80.0–100.0)
Metamyelocytes Relative: 1 %
Monocytes Absolute: 0.2 10*3/uL (ref 0.1–1.0)
Monocytes Relative: 5 %
Neutro Abs: 0.8 10*3/uL — ABNORMAL LOW (ref 1.7–7.7)
Neutrophils Relative %: 12 %
Platelets: 191 10*3/uL (ref 150–400)
RBC: 4.46 MIL/uL (ref 3.87–5.11)
RDW: 12.9 % (ref 11.5–15.5)
WBC: 4.4 10*3/uL (ref 4.0–10.5)
nRBC: 0 % (ref 0.0–0.2)

## 2020-04-17 NOTE — Progress Notes (Signed)
DISCONTINUE OFF PATHWAY REGIMEN - Lymphoma and CLL   [id number]:Obinutuzumab IV D1,8,15 Cycle 1/D1 Cycles 2-6 + Chlorambucil PO D1,15 Cycles 1-6 q28 Days:   A cycle is every 28 days:     Obinutuzumab      Obinutuzumab      Obinutuzumab      Obinutuzumab      Chlorambucil   **Always confirm dose/schedule in your pharmacy ordering system**  REASON: Disease Progression PRIOR TREATMENT: Off Pathway: Obinutuzumab IV D1,8,15 Cycle 1/D1 Cycles 2-6 + Chlorambucil PO D1,15 Cycles 1-6 q28 Days TREATMENT RESPONSE: Progressive Disease (PD)  START OFF PATHWAY REGIMEN - Lymphoma and CLL   [id number]:Ofatumumab 2,000 mg Weekly then q28 Days:   Weekly then q28 days:     Ofatumumab      Ofatumumab      Ofatumumab   **Always confirm dose/schedule in your pharmacy ordering system**  Patient Characteristics: Chronic Lymphocytic Leukemia (CLL), [facility] [place] [facility], Treating as Specialist or Consulting with Specialist Disease Type: Chronic Lymphocytic Leukemia (CLL) Disease Type: Not Applicable Disease Type: Not Applicable Line of Therapy: [facility] Rai Stage: IV Please indicate whether you are: A specialist Intent of Therapy: Non-Curative / Palliative Intent, Discussed with Patient

## 2020-04-17 NOTE — Assessment & Plan Note (Addendum)
Discussed goals of care Treatment goal is palliative Unfortunately, the patient have exhausted several different treatment options I recommend referral to [facility] for second opinion and treatment options In the meantime, we will proceed with chemotherapy as soon as possible

## 2020-04-17 NOTE — Assessment & Plan Note (Addendum)
I have reviewed CT imaging with the patient Unfortunately, she progressed on recent therapy Overall, she has progressed on Bendamustine, Rituximab, Gazyva, Venetoclax, Ibrutinib, Acalabrutinib and Duvelisib We reviewed the guidelines Unfortunately, the only agent she has not been treated is single agent Ofatumumab I do not anticipate robust response Recommend referral to [facility] for second opinion Currently, she is not symptomatic We will proceed with treatment next week We discussed the risk, benefits, side effects of Ofatumumab and she is in agreement to proceed

## 2020-04-17 NOTE — Telephone Encounter (Signed)
-----   Message from Heath Lark, MD sent at 04/17/2020  9:57 AM EDT ----- Regarding: referral to [facility] for 2nd opinion Please refer to Dr. Alver Sorrow [phone]

## 2020-04-17 NOTE — Progress Notes (Signed)
[place] OFFICE PROGRESS NOTE  Patient Care Team: Janith Lima, MD as PCP - General (Internal Medicine) Constance Haw, MD as PCP - Electrophysiology (Cardiology) Constance Haw, MD as Consulting Physician (Cardiology)  ASSESSMENT & PLAN:  CLL (chronic lymphocytic leukemia) ([place]) I have reviewed CT imaging with the patient Unfortunately, she progressed on recent therapy Overall, she has progressed on Bendamustine, Rituximab, Gazyva, Venetoclax, Ibrutinib, Acalabrutinib and Duvelisib We reviewed the guidelines Unfortunately, the only agent she has not been treated is single agent Ofatumumab I do not anticipate robust response Recommend referral to [facility] for second opinion Currently, she is not symptomatic We will proceed with treatment next week We discussed the risk, benefits, side effects of Ofatumumab and she is in agreement to proceed  Goals of care, counseling/discussion Discussed goals of care Treatment goal is palliative Unfortunately, the patient have exhausted several different treatment options I recommend referral to [facility] for second opinion and treatment options In the meantime, we will proceed with chemotherapy as soon as possible   No orders of the defined types were placed in this encounter.   All questions were answered. The patient knows to call the clinic with any problems, questions or concerns. The total time spent in the appointment was 40 minutes encounter with patients including review of chart and various tests results, discussions about plan of care and coordination of care plan   Heath Lark, MD 04/17/2020 9:55 AM  INTERVAL HISTORY: Please see below for problem oriented charting. She returns for treatment and follow-up She is asymptomatic No side effects from treatment so far No recent infection  SUMMARY OF ONCOLOGIC HISTORY: Oncology History Overview Note   17p positive Progressed on Bendamustine, Rituximab, Gazyva, Venetoclax, Ibrutinib, Acalabrutinib and Duvelisib   CLL (chronic lymphocytic leukemia) ([place])  04/05/2015 Pathology Results   Accession: [id number] flow cytometry confirmed CLL. FISH was positive for p53 mutation   04/24/2015 Imaging   Extensive lymphadenopathy throughout the neck, chest (axilla), abdomen and pelvis, as detailed above, compatible with the reported clinical history of lymphoma. 2. Mild splenomegaly.   05/03/2015 - 08/27/2015 Chemotherapy   She started on Ibrutinib, discontinued prematurely when her prescription ran out   10/10/2015 - 05/29/2017 Chemotherapy   She was restarted back on Ibrutinib   11/13/2016 PET scan   Significant generalized reduction in size of numerous lymph nodes in the neck, chest, abdomen, and pelvis. Previously the activity of these nodes was low-level and in general a similar low-level activity is present today, significantly less than mediastinal blood pool activity, compatible with Deauville 2. 2. Coronary atherosclerosis. Mild cardiomegaly. 3. Mildly prominent endometrium for age without accentuated metabolic activity in the endometrium. Consider pelvic sonography for further characterization.   05/27/2017 PET scan   1. Progressive hypermetabolic adenopathy, primarily involving cervical, axillary, pelvic and inguinal lymph nodes bilaterally, consistent with progressive lymphoma. 2. No solid visceral organ or osseous involvement.   06/16/2017 - 08/25/2017 Chemotherapy   The patient had 3 cycles of Rituximab and Bendamustine   07/15/2017 - 07/19/2017 Hospital Admission   The patient was briefly admitted to the hospital due to infusion reaction to rituximab   09/18/2017 PET scan   1. Continued considerable adenopathy in the neck, chest, abdomen, and pelvis. This is generally stable in size but mildly reduced in activity compared to the prior exam. Current levels of activity primarily Deauville 3  and Deauville 4. No splenomegaly. 2. Diffuse new ground-glass opacities in the  lungs with associated hypermetabolic activity. Some forms of lymphoma infiltration can rarely cause this pattern of diffuse ground-glass opacity and hypermetabolic activity. Differential diagnostic considerations might include atypical pneumonia such as mycoplasma, acute hypersensitivity pneumonitis, or acute eosinophilic pneumonia. Pulmonary hemorrhage seems less likely to cause this degree of accentuated metabolic activity.  3. Aortic Atherosclerosis (ICD10-I70.0). Coronary atherosclerosis.   09/24/2017 -  Chemotherapy   The patient had ventoclax for chemotherapy treatment.  Rituximab is added on 11/18/17 to 04/09/18, x 6 cycles   11/19/2017 PET scan   Overall mild interval decrease in hypermetabolic lymphadenopathy throughout the neck, chest, abdomen, and pelvis. No new or increased lymphadenopathy identified.  While there has been resolution of diffuse hypermetabolic bilateral ground-glass pulmonary opacity since prior study, there is a new 14 mm hypermetabolic pulmonary nodule in the posterior right lower lobe. Time course favors inflammatory or infectious etiology over neoplasm. Recommend continued follow-up by chest CT in 3 months.   02/16/2018 PET scan   1. Adenopathy in the neck, chest, and pelvis is stable to minimally reduced in size, and is moderately reduced in activity, primarily Deauville 2 disease today. There is a right common iliac lymph node qualifying as Deauville 3 disease which is stable in size but reduced in activity. 2. Stable size but reduced activity in a pulmonary nodule in the right lower lobe. If this represents a leukemic lesion then a corresponds to Deauville 4 disease. This lesion was not readily apparent on 09/22/2017 but was reported on the prior chest CT of 11/19/2017. 3. Other imaging findings of potential clinical significance: Mild thyroid goiter. Aortic Atherosclerosis (ICD10-I70.0).  Coronary atherosclerosis. Mild cardiomegaly.   04/19/2018 PET scan   1. Interval mild mixed metabolic changes, as detailed. Persistent mildly hypermetabolic bilateral axillary, mediastinal and bilateral pelvic adenopathy and mildly hypermetabolic right lower lobe pulmonary nodule compatible with lymphoproliferative disorder. Deauville 4 based on the subcarinal node. 2. Aortic Atherosclerosis (ICD10-I70.0).   06/29/2018 Procedure   Successful placement of a right internal jugular approach power injectable Port-A-Cath. The catheter is ready for immediate use.   07/26/2018 PET scan   1. Adenopathy primarily in the chest and pelvis as noted above, with size of the mildly enlarged lymph nodes stable to minimally increased, but with nodal activity generally decreased compared to the prior exam. Uptake in these nodes is primarily Deauville 2. 2. Aortic Atherosclerosis (ICD10-I70.0). Coronary atherosclerosis with mild cardiomegaly.   10/29/2018 PET scan   1. Relatively stable sized lymphadenopathy but interval increase in hypermetabolism when compared to the most recent prior PET-CT, as detailed above. 2. No new disease is identified. 3. Stable bilateral pulmonary nodules.   11/12/2018 - 02/02/2019 Chemotherapy   The patient had riTUXimab (RITUXAN) 800 mg in sodium chloride 0.9 % 170 mL infusion, 375 mg/m2 = 800 mg, Intravenous,  Once, 3 of 4 cycles Administration: 800 mg (11/12/2018), 800 mg (12/10/2018), 800 mg (01/06/2019)  for chemotherapy treatment.    01/11/2019 Imaging   1. Significant increased adenopathy in the chest, abdomen, and pelvis compared to the 10/28/2018 PET-CT. 2. The scattered tiny pulmonary nodules and larger single right lower lobe pulmonary nodule are all stable. 3. Other imaging findings of potential clinical significance: Aortic Atherosclerosis (ICD10-I70.0). Coronary atherosclerosis. Mild cardiomegaly. Mild diffuse thyroid prominence, stable. Mild left foraminal impingement at  L4-5.   01/21/2019 Procedure   Successful ultrasound-guided core biopsies of an enlarged right axillary lymph node.    01/21/2019 Pathology Results   Lymph node, needle/core biopsy, Right Axilla - NON-HODGKIN B-CELL LYMPHOMA  CONSISTENT WITH CHRONIC LYMPHOCYTIC LEUKEMIA/SMALL LYMPHOCYTIC LYMPHOMA - SEE COMMENT Microscopic Comment The biopsies are small core biopsies composed of a monotonous population of lymphocytes which are positive for CD20 without expression of cyclin-D1. The latter is important because it essentially rules out the possibility of mantle cell lymphoma. CD3 highlights a small population of T-cells. By flow cytometry, a kappa-restricted monoclonal B-cell population that expresses CD19, CD20, CD5, and CD23 comprises 89% of all lymphocytes. Overall, the features are consistent with chronic lymphocytic leukemia/small lymphocytic lymphoma.   01/31/2019 Echocardiogram    1. The left ventricle has normal systolic function, with an ejection fraction of 55-60%. The cavity size was normal. Left ventricular diastolic Doppler parameters are consistent with impaired relaxation. No evidence of left ventricular regional wall motion abnormalities. GLS -20%.  2. The right ventricle has normal systolic function. The cavity was normal. There is no increase in right ventricular wall thickness.  3. The aortic valve is tricuspid. Mild calcification of the aortic valve. No stenosis of the aortic valve.  4. The aortic root is normal in size and structure.  5. 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  6. No evidence of mitral valve stenosis. No significant mitral regurgitation.  7. Normal IVC size. No complete TR doppler jet so unable to estimate PA systolic pressure.   02/03/2019 - 07/24/2019 Chemotherapy   The patient had obinutuzumab and Acalabrutinib for chemotherapy treatment.    05/02/2019 Imaging   1. Interval decrease in size axillary and mediastinal adenopathy. 2. There  are a few retroperitoneal lymph nodes which are mildly increased in size. The majority of the retroperitoneal and pelvic lymph nodes are grossly similar when compared to prior exam. 3. Stable scattered pulmonary nodules.   07/22/2019 Imaging   1. Progressive disease within the neck, chest, abdomen, and pelvis, as evidenced by increase in adenopathy. 2. Similar bilateral pulmonary nodules. 3. Extensive right-sided colonic pneumatosis and small volume extracolonic intraperitoneal gas. Correlate with abdominal symptoms. Of note, pneumatosis has been described in patients on immunotherapy. 4. Age advanced coronary artery atherosclerosis. Recommend assessment of coronary risk factors and consideration of medical therapy. 5.  Aortic Atherosclerosis (ICD10-I70.0). 6. Similar mild L1 compression deformity. 7. Trace free pelvic fluid, similar. 8. Pulmonary artery enlargement suggests pulmonary arterial hypertension.   08/11/2019 - 04/17/2020 Chemotherapy   The patient had duvelisib for chemotherapy treatment.     10/12/2019 Imaging   1. Slight enlargement of a high right paratracheal lymph node. Otherwise, lymph nodes in the axillary regions, abdomen and pelvis have decreased in size in the interval, consistent with treatment response. 2. Interval decrease in size and number of pulmonary nodules, indicative of metastatic disease. 3. There may be mild nonspecific diffuse esophageal wall thickening. 4. Aortic atherosclerosis (ICD10-I70.0). Coronary artery calcification. 5. Enlarged pulmonic trunk, indicative of arterial hypertension.   10/13/2019 -  Hospital Admission   She was admitted for severe dysphagia, dehydration and electrolytes abnormalities   01/09/2020 Imaging   1. Slight enlargement of bilateral axillary and subpectoral lymph nodes. Otherwise, small to borderline enlarged lymph nodes in the chest, abdomen and pelvis are similar to the prior exam. 2. Right lower lobe nodule is stable. 3. Aortic  atherosclerosis (ICD10-I70.0). Coronary artery calcification. 4. Enlarged pulmonic trunk, indicative of pulmonary arterial hypertension.   04/16/2020 Imaging   1. Mild enlargement of nodes within the chest, abdomen, and pelvis, as detailed above. Findings consistent with disease progression. 2. Age advanced coronary artery atherosclerosis. Recommend assessment of coronary risk factors and  consideration of medical therapy. 3.  Aortic Atherosclerosis (ICD10-I70.0). 4. Pulmonary artery enlargement suggests pulmonary arterial hypertension. 5. Similar well-circumscribed right lower lobe pulmonary nodule. Favored to be benign. Recommend attention on follow-up.     04/25/2020 -  Chemotherapy   The patient had ofatumumab (ARZERRA) 300 mg in sodium chloride 0.9 % 985 mL chemo infusion, 300 mg, Intravenous,  Once, 0 of 5 cycles  for chemotherapy treatment.      REVIEW OF SYSTEMS:   Constitutional: Denies fevers, chills or abnormal weight loss Eyes: Denies blurriness of vision Ears, nose, mouth, throat, and face: Denies mucositis or sore throat Respiratory: Denies cough, dyspnea or wheezes Cardiovascular: Denies palpitation, chest discomfort or lower extremity swelling Gastrointestinal:  Denies nausea, heartburn or change in bowel habits Skin: Denies abnormal skin rashes Lymphatics: Denies new lymphadenopathy or easy bruising Neurological:Denies numbness, tingling or new weaknesses Behavioral/Psych: Mood is stable, no new changes  All other systems were reviewed with the patient and are negative.  I have reviewed the past medical history, past surgical history, social history and family history with the patient and they are unchanged from previous note.  ALLERGIES:  has No Known Allergies.  MEDICATIONS:  Current Outpatient Medications  Medication Sig Dispense Refill  . apixaban (ELIQUIS) 5 MG TABS tablet Take 1 tablet (5 mg total) by mouth 2 (two) times daily. 180 tablet 1  . Cholecalciferol  (VITAMIN D-3) 1000 units CAPS Take 1,000 Units by mouth daily with breakfast.    . lidocaine-prilocaine (EMLA) cream Apply 1 application topically as needed. (Patient taking differently: Apply 1 application topically as needed (acess port). ) 30 g 6  . metoprolol succinate (TOPROL-XL) 100 MG 24 hr tablet TAKE 1 AND 1/2 TABLETS BY MOUTH EVERY DAY. TAKE WITH FOOD OR RIGHT AFTER A MEAL 135 tablet 2  . ondansetron (ZOFRAN-ODT) 8 MG disintegrating tablet Take 1 tablet (8 mg total) by mouth every 8 (eight) hours as needed for nausea or vomiting. 20 tablet 0  . pantoprazole (PROTONIX) 40 MG tablet TAKE 1 TABLET BY MOUTH TWICE A DAY 180 tablet 1  . sacubitril-valsartan (ENTRESTO) 49-51 MG Take 1 tablet by mouth 2 (two) times daily. 180 tablet 1  . sulfamethoxazole-trimethoprim (BACTRIM) 400-80 MG tablet TAKE 1 TABLET BY MOUTH DAILY. 30 tablet 6  . valGANciclovir (VALCYTE) 450 MG tablet TAKE 2 TABLETS (900 MG TOTAL) BY MOUTH DAILY. 60 tablet 6   No current facility-administered medications for this visit.    PHYSICAL EXAMINATION: ECOG PERFORMANCE STATUS: 1 - Symptomatic but completely ambulatory  Vitals:   04/17/20 0801  BP: (!) 141/72  Pulse: (!) 55  Resp: 18  Temp: 98.2 F (36.8 C)  SpO2: 100%   Filed Weights   04/17/20 0801  Weight: 196 lb (88.9 kg)    GENERAL:alert, no distress and comfortable NEURO: alert & oriented x 3 with fluent speech, no focal motor/sensory deficits  LABORATORY DATA:  I have reviewed the data as listed    Component Value Date/Time   NA 142 04/17/2020 0900   NA 140 10/09/2017 1416   K 3.8 04/17/2020 0900   K 4.4 10/09/2017 1416   CL 106 04/17/2020 0900   CL 105 01/18/2013 0912   CO2 25 04/17/2020 0900   CO2 25 10/09/2017 1416   GLUCOSE 80 04/17/2020 0900   GLUCOSE 117 10/09/2017 1416   GLUCOSE 83 01/18/2013 0912   BUN 13 04/17/2020 0900   BUN 11.1 10/09/2017 1416   CREATININE 0.86 04/17/2020 0900   CREATININE 0.72 11/01/2018  1610   CREATININE 0.8  10/09/2017 1416   CALCIUM 9.4 04/17/2020 0900   CALCIUM 9.6 10/09/2017 1416   PROT 6.7 04/17/2020 0900   PROT 6.4 10/09/2017 1416   ALBUMIN 4.1 04/17/2020 0900   ALBUMIN 3.4 (L) 10/09/2017 1416   AST 19 04/17/2020 0900   AST 17 11/01/2018 0903   AST 18 10/09/2017 1416   ALT 14 04/17/2020 0900   ALT 15 11/01/2018 0903   ALT 19 10/09/2017 1416   ALKPHOS 67 04/17/2020 0900   ALKPHOS 69 10/09/2017 1416   BILITOT 0.7 04/17/2020 0900   BILITOT 1.3 (H) 11/01/2018 0903   BILITOT 0.57 10/09/2017 1416   GFRNONAA >60 04/17/2020 0900   GFRNONAA >60 11/01/2018 0903   GFRAA >60 04/17/2020 0900   GFRAA >60 11/01/2018 0903    No results found for: SPEP, UPEP  Lab Results  Component Value Date   WBC 4.4 04/17/2020   NEUTROABS 0.8 (L) 04/17/2020   HGB 12.6 04/17/2020   HCT 39.2 04/17/2020   MCV 87.9 04/17/2020   PLT 191 04/17/2020      Chemistry      Component Value Date/Time   NA 142 04/17/2020 0900   NA 140 10/09/2017 1416   K 3.8 04/17/2020 0900   K 4.4 10/09/2017 1416   CL 106 04/17/2020 0900   CL 105 01/18/2013 0912   CO2 25 04/17/2020 0900   CO2 25 10/09/2017 1416   BUN 13 04/17/2020 0900   BUN 11.1 10/09/2017 1416   CREATININE 0.86 04/17/2020 0900   CREATININE 0.72 11/01/2018 0903   CREATININE 0.8 10/09/2017 1416      Component Value Date/Time   CALCIUM 9.4 04/17/2020 0900   CALCIUM 9.6 10/09/2017 1416   ALKPHOS 67 04/17/2020 0900   ALKPHOS 69 10/09/2017 1416   AST 19 04/17/2020 0900   AST 17 11/01/2018 0903   AST 18 10/09/2017 1416   ALT 14 04/17/2020 0900   ALT 15 11/01/2018 0903   ALT 19 10/09/2017 1416   BILITOT 0.7 04/17/2020 0900   BILITOT 1.3 (H) 11/01/2018 0903   BILITOT 0.57 10/09/2017 1416       RADIOGRAPHIC STUDIES: I have reviewed multiple imaging studies with the patient I have personally reviewed the radiological images as listed and agreed with the findings in the report. CT CHEST W CONTRAST  Result Date: 04/16/2020 CLINICAL DATA:   Chronic lymphocytic leukemia diagnosed in 2010. Evaluate for treatment response. Ongoing chemotherapy. EXAM: CT CHEST, ABDOMEN, AND PELVIS WITH CONTRAST TECHNIQUE: Multidetector CT imaging of the chest, abdomen and pelvis was performed following the standard protocol during bolus administration of intravenous contrast. CONTRAST:  18m OMNIPAQUE IOHEXOL 300 MG/ML  SOLN COMPARISON:  01/09/2020 FINDINGS: CT CHEST FINDINGS Cardiovascular: Right Port-A-Cath tip at high right atrium. Aortic atherosclerosis. Mild cardiomegaly. Multivessel coronary artery atherosclerosis. Pulmonary artery enlargement, outflow tract 3.4 cm. Mediastinum/Nodes: Bilateral axillary adenopathy. Right axillary index node measures 1.6 x 3.4 cm on 12/02. Compare 1.5 x 2.8 cm on the prior. More caudal right axillary node measures 9 mm on 16/2 versus 6 mm on the prior. Index left axillary node measures 1.6 cm on 12/02 versus 1.4 cm on the prior. Just lateral to this, a left axillary node measures 1.0 cm on 16/2 versus 8 mm on the prior. Left infrahilar node measures 1.2 cm on 25/2, new since the prior. Lungs/Pleura: No pleural fluid. Well-circumscribed right lower lobe pulmonary nodule measures 11 x 10 mm on 79/6 and is similar to 12 x 10 mm on  the prior exam. Musculoskeletal: No acute osseous abnormality. CT ABDOMEN PELVIS FINDINGS Hepatobiliary: Normal liver. Normal gallbladder, without biliary ductal dilatation. Pancreas: Normal, without mass or ductal dilatation. Spleen: Normal in size, without focal abnormality. Adrenals/Urinary Tract: Normal adrenal glands. 9 mm low-density right renal lesion is likely a cyst. Normal left kidney. No hydronephrosis. Normal urinary bladder. Stomach/Bowel: Normal stomach, without wall thickening. Normal colon, appendix, and terminal ileum. Normal small bowel. Vascular/Lymphatic: Aortic atherosclerosis. Left abdominal retroperitoneal 8 mm node on 76/2, enlarged from 5 mm on the prior. Portal caval node measures 1.2  cm on 60/2 versus 1.0 cm on the prior exam (when remeasured). Small bowel mesenteric 9 mm node on 69/2 is enlarged from 5 mm on the prior. Right obturator 1.2 x 2.9 cm node on 103/2 is enlarged from 0.8 x 2.2 cm on the prior exam (when remeasured). Left external iliac node measures 1.2 cm on 100/2 versus 1.0 cm on the prior exam (when remeasured). Reproductive: Uterus positioned eccentric right.  No adnexal mass. Other: No significant free fluid.  Mild pelvic floor laxity. Musculoskeletal: Mild superior endplate compression deformity at L1 is not significantly changed. IMPRESSION: 1. Mild enlargement of nodes within the chest, abdomen, and pelvis, as detailed above. Findings consistent with disease progression. 2. Age advanced coronary artery atherosclerosis. Recommend assessment of coronary risk factors and consideration of medical therapy. 3.  Aortic Atherosclerosis (ICD10-I70.0). 4. Pulmonary artery enlargement suggests pulmonary arterial hypertension. 5. Similar well-circumscribed right lower lobe pulmonary nodule. Favored to be benign. Recommend attention on follow-up. Electronically Signed   By: Abigail Miyamoto M.D.   On: 04/16/2020 09:45   CT ABDOMEN PELVIS W CONTRAST  Result Date: 04/16/2020 CLINICAL DATA:  Chronic lymphocytic leukemia diagnosed in 2010. Evaluate for treatment response. Ongoing chemotherapy. EXAM: CT CHEST, ABDOMEN, AND PELVIS WITH CONTRAST TECHNIQUE: Multidetector CT imaging of the chest, abdomen and pelvis was performed following the standard protocol during bolus administration of intravenous contrast. CONTRAST:  165m OMNIPAQUE IOHEXOL 300 MG/ML  SOLN COMPARISON:  01/09/2020 FINDINGS: CT CHEST FINDINGS Cardiovascular: Right Port-A-Cath tip at high right atrium. Aortic atherosclerosis. Mild cardiomegaly. Multivessel coronary artery atherosclerosis. Pulmonary artery enlargement, outflow tract 3.4 cm. Mediastinum/Nodes: Bilateral axillary adenopathy. Right axillary index node measures 1.6  x 3.4 cm on 12/02. Compare 1.5 x 2.8 cm on the prior. More caudal right axillary node measures 9 mm on 16/2 versus 6 mm on the prior. Index left axillary node measures 1.6 cm on 12/02 versus 1.4 cm on the prior. Just lateral to this, a left axillary node measures 1.0 cm on 16/2 versus 8 mm on the prior. Left infrahilar node measures 1.2 cm on 25/2, new since the prior. Lungs/Pleura: No pleural fluid. Well-circumscribed right lower lobe pulmonary nodule measures 11 x 10 mm on 79/6 and is similar to 12 x 10 mm on the prior exam. Musculoskeletal: No acute osseous abnormality. CT ABDOMEN PELVIS FINDINGS Hepatobiliary: Normal liver. Normal gallbladder, without biliary ductal dilatation. Pancreas: Normal, without mass or ductal dilatation. Spleen: Normal in size, without focal abnormality. Adrenals/Urinary Tract: Normal adrenal glands. 9 mm low-density right renal lesion is likely a cyst. Normal left kidney. No hydronephrosis. Normal urinary bladder. Stomach/Bowel: Normal stomach, without wall thickening. Normal colon, appendix, and terminal ileum. Normal small bowel. Vascular/Lymphatic: Aortic atherosclerosis. Left abdominal retroperitoneal 8 mm node on 76/2, enlarged from 5 mm on the prior. Portal caval node measures 1.2 cm on 60/2 versus 1.0 cm on the prior exam (when remeasured). Small bowel mesenteric 9 mm node on 69/2 is  enlarged from 5 mm on the prior. Right obturator 1.2 x 2.9 cm node on 103/2 is enlarged from 0.8 x 2.2 cm on the prior exam (when remeasured). Left external iliac node measures 1.2 cm on 100/2 versus 1.0 cm on the prior exam (when remeasured). Reproductive: Uterus positioned eccentric right.  No adnexal mass. Other: No significant free fluid.  Mild pelvic floor laxity. Musculoskeletal: Mild superior endplate compression deformity at L1 is not significantly changed. IMPRESSION: 1. Mild enlargement of nodes within the chest, abdomen, and pelvis, as detailed above. Findings consistent with disease  progression. 2. Age advanced coronary artery atherosclerosis. Recommend assessment of coronary risk factors and consideration of medical therapy. 3.  Aortic Atherosclerosis (ICD10-I70.0). 4. Pulmonary artery enlargement suggests pulmonary arterial hypertension. 5. Similar well-circumscribed right lower lobe pulmonary nodule. Favored to be benign. Recommend attention on follow-up. Electronically Signed   By: Abigail Miyamoto M.D.   On: 04/16/2020 09:45

## 2020-04-17 NOTE — Telephone Encounter (Signed)
Called to [facility]  For referral for 2nd Opinion Per Dr Alvy Bimler Pt to see Dr Cassell Clement but Per Sharol Harness at referral intake Dr. Joan Mayans deals with CLL Pt's Dr. Cindra Presume with Pt seeing DR. Bhave. Demographics sheet and Dr. Alvy Bimler Last note Faxed to [phone]. Pt scheduled to see Dr. Joan Mayans on August 2nd at 115 arrival, 130 lab and 2 pm appointment to see Dr Joan Mayans. TC to Pt left message about appointment time.

## 2020-04-19 NOTE — Progress Notes (Signed)
Pharmacist Chemotherapy Monitoring - Initial Assessment    Anticipated start date: 04/25/20  Regimen:  . Are orders appropriate based on the patient's diagnosis, regimen, and cycle? Yes . Does the plan date match the patient's scheduled date? Yes . Is the sequencing of drugs appropriate? Yes . Are the premedications appropriate for the patient's regimen? Yes . Prior Authorization for treatment is: Approved o If applicable, is the correct biosimilar selected based on the patient's insurance? not applicable  Organ Function and Labs: Marland Kitchen Are dose adjustments needed based on the patient's renal function, hepatic function, or hematologic function? Yes . Are appropriate labs ordered prior to the start of patient's treatment? Yes . Other organ system assessment, if indicated: N/A . The following baseline labs, if indicated, have been ordered: ofatumumab: baseline Hepatitis B labs  Dose Assessment: . Are the drug doses appropriate? Yes . Are the following correct: o Drug concentrations Yes o IV fluid compatible with drug Yes o Administration routes Yes o Timing of therapy Yes . If applicable, does the patient have documented access for treatment and/or plans for port-a-cath placement? yes . If applicable, have lifetime cumulative doses been properly documented and assessed? not applicable Lifetime Dose Tracking  No doses have been documented on this patient for the following tracked chemicals: Doxorubicin, Epirubicin, Idarubicin, Daunorubicin, Mitoxantrone, Bleomycin, Oxaliplatin, Carboplatin, Liposomal Doxorubicin  o   Toxicity Monitoring/Prevention: . The patient has the following take home antiemetics prescribed: Ondansetron . The patient has the following take home medications prescribed: antimicrobial prophylaxis . Medication allergies and previous infusion related reactions, if applicable, have been reviewed and addressed. Yes . The patient's current medication list has been assessed  for drug-drug interactions with their chemotherapy regimen. no significant drug-drug interactions were identified on review.  Order Review: . Are the treatment plan orders signed? Yes . Is the patient scheduled to see a provider prior to their treatment? No  I verify that I have reviewed each item in the above checklist and answered each question accordingly.  Philomena Course 04/19/2020 2:34 PM

## 2020-04-23 ENCOUNTER — Other Ambulatory Visit: Payer: Self-pay | Admitting: Hematology and Oncology

## 2020-04-23 ENCOUNTER — Telehealth: Payer: Self-pay | Admitting: Hematology and Oncology

## 2020-04-23 DIAGNOSIS — C9110 Chronic lymphocytic leukemia of B-cell type not having achieved remission: Secondary | ICD-10-CM

## 2020-04-23 MED ORDER — LENALIDOMIDE 10 MG PO CAPS
10.0000 mg | ORAL_CAPSULE | Freq: Every day | ORAL | 0 refills | Status: DC
Start: 2020-04-23 — End: 2020-04-25

## 2020-04-23 NOTE — Telephone Encounter (Signed)
Scheduled appt per 7/19 sch msg - pt is aware of appt added

## 2020-04-23 NOTE — Telephone Encounter (Signed)
Attempting to call the patient again I have counseled her treatment due to unavailability of Ofatumumab I will see her in 2 days to discuss treatment options and starting her on lenalidomide

## 2020-04-23 NOTE — Telephone Encounter (Signed)
I attempted to call the patient today Unfortunately, Ofatumumab is only available in the compassionate use I recommend switching her to lenalidomide but unable to get hold of her I will try to call her again later today

## 2020-04-25 ENCOUNTER — Telehealth: Payer: Self-pay | Admitting: Pharmacist

## 2020-04-25 ENCOUNTER — Telehealth: Payer: Self-pay | Admitting: Hematology and Oncology

## 2020-04-25 ENCOUNTER — Other Ambulatory Visit: Payer: Self-pay

## 2020-04-25 ENCOUNTER — Inpatient Hospital Stay (HOSPITAL_BASED_OUTPATIENT_CLINIC_OR_DEPARTMENT_OTHER): Payer: BC Managed Care – PPO | Admitting: Hematology and Oncology

## 2020-04-25 ENCOUNTER — Encounter: Payer: Self-pay | Admitting: Hematology and Oncology

## 2020-04-25 ENCOUNTER — Inpatient Hospital Stay: Payer: BC Managed Care – PPO

## 2020-04-25 ENCOUNTER — Telehealth: Payer: Self-pay

## 2020-04-25 DIAGNOSIS — Z79899 Other long term (current) drug therapy: Secondary | ICD-10-CM | POA: Diagnosis not present

## 2020-04-25 DIAGNOSIS — C9110 Chronic lymphocytic leukemia of B-cell type not having achieved remission: Secondary | ICD-10-CM

## 2020-04-25 DIAGNOSIS — Z7901 Long term (current) use of anticoagulants: Secondary | ICD-10-CM | POA: Diagnosis not present

## 2020-04-25 DIAGNOSIS — Z7189 Other specified counseling: Secondary | ICD-10-CM | POA: Diagnosis not present

## 2020-04-25 DIAGNOSIS — I2510 Atherosclerotic heart disease of native coronary artery without angina pectoris: Secondary | ICD-10-CM | POA: Diagnosis not present

## 2020-04-25 DIAGNOSIS — C9112 Chronic lymphocytic leukemia of B-cell type in relapse: Secondary | ICD-10-CM | POA: Diagnosis not present

## 2020-04-25 MED ORDER — LENALIDOMIDE 10 MG PO CAPS: 10.0000 mg | ORAL_CAPSULE | Freq: Every day | ORAL | 0 refills | Status: DC

## 2020-04-25 NOTE — Progress Notes (Signed)
[facility] OFFICE PROGRESS NOTE  Patient Care Team: Janith Lima, MD as PCP - General (Internal Medicine) Constance Haw, MD as PCP - Electrophysiology (Cardiology) Constance Haw, MD as Consulting Physician (Cardiology)  ASSESSMENT & PLAN:  CLL (chronic lymphocytic leukemia) [facility]) I shared my concerns about treatment options Unfortunately, Ofatumumab is no longer available We reviewed the guidelines again, options include Revlimid or Zanubrutinib The patient had treatment with ibrutinib and a Acaabrutinib before.  Hence, I am not in favor of doing zanubrutinib  We discussed the risk, benefits, side effects of Revlimid including risk of pancytopenia and thrombotic risk and she is in agreement to proceed We will get her enrolled with [facility] She will start treatment as soon as possible Due to history of pancytopenia, I plan to start her on a modest dose of 10 mg/day without break If she tolerate that well, we can slowly increase to a maximum of 20 to 25 mg/day Her appointment for second opinion at [facility] is pending I will see her as scheduled on August 4 for further follow-up and toxicity review She will continue Eliquis for thrombotic prevention  If I am not able to get Revlimid approved, the last resort would be to prescribe fludarabine-based treatment   Goals of care, counseling/discussion Discussed goals of care Treatment goal is palliative Unfortunately, the patient have exhausted most treatment options I recommend referral to [facility] for second opinion and treatment options In the meantime, we will proceed with chemotherapy as soon as possible   No orders of the defined types were placed in this encounter.   All questions were answered. The patient knows to call the clinic with any problems, questions or concerns. The total time spent in the appointment was 20 minutes encounter with  patients including review of chart and various tests results, discussions about plan of care and coordination of care plan   Heath Lark, MD 04/25/2020 11:09 AM  INTERVAL HISTORY: Please see below for problem oriented charting. She is seen urgently due to unavailability of Ofatumumab Since last time I saw her, she has been feeling well I presented her case at the hematology tumor board yesterday and have discussed with pharmacist recently Ofatumumab is no longer available for use We reviewed the NCCN guidelines Other options include lenalidomide or zanubrutinib  SUMMARY OF ONCOLOGIC HISTORY: Oncology History Overview Note  17p positive Progressed on Bendamustine, Rituximab, Gazyva, Venetoclax, Ibrutinib, Acalabrutinib and Duvelisib   CLL (chronic lymphocytic leukemia) ([facility])  04/05/2015 Pathology Results   Accession: [id number] flow cytometry confirmed CLL. FISH was positive for p53 mutation   04/24/2015 Imaging   Extensive lymphadenopathy throughout the neck, chest (axilla), abdomen and pelvis, as detailed above, compatible with the reported clinical history of lymphoma. 2. Mild splenomegaly.   05/03/2015 - 08/27/2015 Chemotherapy   She started on Ibrutinib, discontinued prematurely when her prescription ran out   10/10/2015 - 05/29/2017 Chemotherapy   She was restarted back on Ibrutinib   11/13/2016 PET scan   Significant generalized reduction in size of numerous lymph nodes in the neck, chest, abdomen, and pelvis. Previously the activity of these nodes was low-level and in general a similar low-level activity is present today, significantly less than mediastinal blood pool activity, compatible with Deauville 2. 2. Coronary atherosclerosis. Mild cardiomegaly. 3. Mildly prominent endometrium for age without accentuated metabolic activity in the endometrium. Consider pelvic sonography for further characterization.   05/27/2017 PET scan   1. Progressive  hypermetabolic adenopathy, primarily  involving cervical, axillary, pelvic and inguinal lymph nodes bilaterally, consistent with progressive lymphoma. 2. No solid visceral organ or osseous involvement.   06/16/2017 - 08/25/2017 Chemotherapy   The patient had 3 cycles of Rituximab and Bendamustine   07/15/2017 - 07/19/2017 Hospital Admission   The patient was briefly admitted to the hospital due to infusion reaction to rituximab   09/18/2017 PET scan   1. Continued considerable adenopathy in the neck, chest, abdomen, and pelvis. This is generally stable in size but mildly reduced in activity compared to the prior exam. Current levels of activity primarily Deauville 3 and Deauville 4. No splenomegaly. 2. Diffuse new ground-glass opacities in the lungs with associated hypermetabolic activity. Some forms of lymphoma infiltration can rarely cause this pattern of diffuse ground-glass opacity and hypermetabolic activity. Differential diagnostic considerations might include atypical pneumonia such as mycoplasma, acute hypersensitivity pneumonitis, or acute eosinophilic pneumonia. Pulmonary hemorrhage seems less likely to cause this degree of accentuated metabolic activity.  3. Aortic Atherosclerosis (ICD10-I70.0). Coronary atherosclerosis.   09/24/2017 -  Chemotherapy   The patient had ventoclax for chemotherapy treatment.  Rituximab is added on 11/18/17 to 04/09/18, x 6 cycles   11/19/2017 PET scan   Overall mild interval decrease in hypermetabolic lymphadenopathy throughout the neck, chest, abdomen, and pelvis. No new or increased lymphadenopathy identified.  While there has been resolution of diffuse hypermetabolic bilateral ground-glass pulmonary opacity since prior study, there is a new 14 mm hypermetabolic pulmonary nodule in the posterior right lower lobe. Time course favors inflammatory or infectious etiology over neoplasm. Recommend continued follow-up by chest CT in 3 months.   02/16/2018 PET scan   1. Adenopathy in the neck,  chest, and pelvis is stable to minimally reduced in size, and is moderately reduced in activity, primarily Deauville 2 disease today. There is a right common iliac lymph node qualifying as Deauville 3 disease which is stable in size but reduced in activity. 2. Stable size but reduced activity in a pulmonary nodule in the right lower lobe. If this represents a leukemic lesion then a corresponds to Deauville 4 disease. This lesion was not readily apparent on 09/22/2017 but was reported on the prior chest CT of 11/19/2017. 3. Other imaging findings of potential clinical significance: Mild thyroid goiter. Aortic Atherosclerosis (ICD10-I70.0). Coronary atherosclerosis. Mild cardiomegaly.   04/19/2018 PET scan   1. Interval mild mixed metabolic changes, as detailed. Persistent mildly hypermetabolic bilateral axillary, mediastinal and bilateral pelvic adenopathy and mildly hypermetabolic right lower lobe pulmonary nodule compatible with lymphoproliferative disorder. Deauville 4 based on the subcarinal node. 2. Aortic Atherosclerosis (ICD10-I70.0).   06/29/2018 Procedure   Successful placement of a right internal jugular approach power injectable Port-A-Cath. The catheter is ready for immediate use.   07/26/2018 PET scan   1. Adenopathy primarily in the chest and pelvis as noted above, with size of the mildly enlarged lymph nodes stable to minimally increased, but with nodal activity generally decreased compared to the prior exam. Uptake in these nodes is primarily Deauville 2. 2. Aortic Atherosclerosis (ICD10-I70.0). Coronary atherosclerosis with mild cardiomegaly.   10/29/2018 PET scan   1. Relatively stable sized lymphadenopathy but interval increase in hypermetabolism when compared to the most recent prior PET-CT, as detailed above. 2. No new disease is identified. 3. Stable bilateral pulmonary nodules.   11/12/2018 - 02/02/2019 Chemotherapy   The patient had riTUXimab (RITUXAN) 800 mg in sodium chloride  0.9 % 170 mL infusion, 375 mg/m2 = 800 mg, Intravenous,  Once,  3 of 4 cycles Administration: 800 mg (11/12/2018), 800 mg (12/10/2018), 800 mg (01/06/2019)  for chemotherapy treatment.    01/11/2019 Imaging   1. Significant increased adenopathy in the chest, abdomen, and pelvis compared to the 10/28/2018 PET-CT. 2. The scattered tiny pulmonary nodules and larger single right lower lobe pulmonary nodule are all stable. 3. Other imaging findings of potential clinical significance: Aortic Atherosclerosis (ICD10-I70.0). Coronary atherosclerosis. Mild cardiomegaly. Mild diffuse thyroid prominence, stable. Mild left foraminal impingement at L4-5.   01/21/2019 Procedure   Successful ultrasound-guided core biopsies of an enlarged right axillary lymph node.    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 The latter is important because it essentially rules out the possibility of mantle cell lymphoma. CD3 highlights a small population of T-cells. By flow cytometry, a kappa-restricted monoclonal B-cell population that expresses CD19, CD20, CD5, and CD23 comprises 89% of all lymphocytes. Overall, the features are consistent with chronic lymphocytic leukemia/small lymphocytic lymphoma.   01/31/2019 Echocardiogram    1. The left ventricle has normal systolic function, with an ejection fraction of 55-60%. The cavity size was normal. Left ventricular diastolic Doppler parameters are consistent with impaired relaxation. No evidence of left ventricular regional wall motion abnormalities. GLS -20%.  2. The right ventricle has normal systolic function. The cavity was normal. There is no increase in right  ventricular wall thickness.  3. The aortic valve is tricuspid. Mild calcification of the aortic valve. No stenosis of the aortic valve.  4. The aortic root is normal in size and structure.  5. 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  6. No evidence of mitral valve stenosis. No significant mitral regurgitation.  7. Normal IVC size. No complete TR doppler jet so unable to estimate PA systolic pressure.   02/03/2019 - 07/24/2019 Chemotherapy   The patient had obinutuzumab and Acalabrutinib for chemotherapy treatment.    05/02/2019 Imaging   1. Interval decrease in size axillary and mediastinal adenopathy. 2. There are a few retroperitoneal lymph nodes which are mildly increased in size. The majority of the retroperitoneal and pelvic lymph nodes are grossly similar when compared to prior exam. 3. Stable scattered pulmonary nodules.   07/22/2019 Imaging   1. Progressive disease within the neck, chest, abdomen, and pelvis, as evidenced by increase in adenopathy. 2. Similar bilateral pulmonary nodules. 3. Extensive right-sided colonic pneumatosis and small volume extracolonic intraperitoneal gas. Correlate with abdominal symptoms. Of note, pneumatosis has been described in patients on immunotherapy. 4. Age advanced coronary artery atherosclerosis. Recommend assessment of coronary risk factors and consideration of medical therapy. 5.  Aortic Atherosclerosis (ICD10-I70.0). 6. Similar mild L1 compression deformity. 7. Trace free pelvic fluid, similar. 8. Pulmonary artery enlargement suggests pulmonary arterial hypertension.   08/11/2019 - 04/17/2020 Chemotherapy   The patient had duvelisib for chemotherapy treatment.     10/12/2019 Imaging   1. Slight enlargement of a high right paratracheal lymph node. Otherwise, lymph nodes in the axillary regions, abdomen and pelvis have decreased in size in the interval, consistent with treatment response. 2. Interval decrease in size and  number of pulmonary nodules, indicative of metastatic disease. 3. There may be mild nonspecific diffuse esophageal wall thickening. 4. Aortic atherosclerosis (ICD10-I70.0). Coronary artery calcification. 5. Enlarged pulmonic trunk, indicative  of arterial hypertension.   10/13/2019 -  Hospital Admission   She was admitted for severe dysphagia, dehydration and electrolytes abnormalities   01/09/2020 Imaging   1. Slight enlargement of bilateral axillary and subpectoral lymph nodes. Otherwise, small to borderline enlarged lymph nodes in the chest, abdomen and pelvis are similar to the prior exam. 2. Right lower lobe nodule is stable. 3. Aortic atherosclerosis (ICD10-I70.0). Coronary artery calcification. 4. Enlarged pulmonic trunk, indicative of pulmonary arterial hypertension.   04/16/2020 Imaging   1. Mild enlargement of nodes within the chest, abdomen, and pelvis, as detailed above. Findings consistent with disease progression. 2. Age advanced coronary artery atherosclerosis. Recommend assessment of coronary risk factors and consideration of medical therapy. 3.  Aortic Atherosclerosis (ICD10-I70.0). 4. Pulmonary artery enlargement suggests pulmonary arterial hypertension. 5. Similar well-circumscribed right lower lobe pulmonary nodule. Favored to be benign. Recommend attention on follow-up.       REVIEW OF SYSTEMS:   Constitutional: Denies fevers, chills or abnormal weight loss Eyes: Denies blurriness of vision Ears, nose, mouth, throat, and face: Denies mucositis or sore throat Respiratory: Denies cough, dyspnea or wheezes Cardiovascular: Denies palpitation, chest discomfort or lower extremity swelling Gastrointestinal:  Denies nausea, heartburn or change in bowel habits Skin: Denies abnormal skin rashes Lymphatics: Denies new lymphadenopathy or easy bruising Neurological:Denies numbness, tingling or new weaknesses Behavioral/Psych: Mood is stable, no new changes  All other systems were  reviewed with the patient and are negative.  I have reviewed the past medical history, past surgical history, social history and family history with the patient and they are unchanged from previous note.  ALLERGIES:  has No Known Allergies.  MEDICATIONS:  Current Outpatient Medications  Medication Sig Dispense Refill  . apixaban (ELIQUIS) 5 MG TABS tablet Take 1 tablet (5 mg total) by mouth 2 (two) times daily. 180 tablet 1  . Cholecalciferol (VITAMIN D-3) 1000 units CAPS Take 1,000 Units by mouth daily with breakfast.    . lenalidomide (REVLIMID) 10 MG capsule Take 1 capsule (10 mg total) by mouth daily. [employer] Auth #      Date Obtained 28 capsule 0  . lidocaine-prilocaine (EMLA) cream Apply 1 application topically as needed. (Patient taking differently: Apply 1 application topically as needed (acess port). ) 30 g 6  . metoprolol succinate (TOPROL-XL) 100 MG 24 hr tablet TAKE 1 AND 1/2 TABLETS BY MOUTH EVERY DAY. TAKE WITH FOOD OR RIGHT AFTER A MEAL 135 tablet 2  . ondansetron (ZOFRAN-ODT) 8 MG disintegrating tablet Take 1 tablet (8 mg total) by mouth every 8 (eight) hours as needed for nausea or vomiting. 20 tablet 0  . pantoprazole (PROTONIX) 40 MG tablet TAKE 1 TABLET BY MOUTH TWICE A DAY 180 tablet 1  . sacubitril-valsartan (ENTRESTO) 49-51 MG Take 1 tablet by mouth 2 (two) times daily. 180 tablet 1  . sulfamethoxazole-trimethoprim (BACTRIM) 400-80 MG tablet TAKE 1 TABLET BY MOUTH DAILY. 30 tablet 6  . valGANciclovir (VALCYTE) 450 MG tablet TAKE 2 TABLETS (900 MG TOTAL) BY MOUTH DAILY. 60 tablet 6   No current facility-administered medications for this visit.    PHYSICAL EXAMINATION: ECOG PERFORMANCE STATUS: 0 - Asymptomatic  Vitals:   04/25/20 0808  BP: (!) 147/90  Pulse: (!) 51  Resp: 18  Temp: 98.3 F (36.8 C)  SpO2: 100%   Filed Weights   04/25/20 0808  Weight: 194 lb 6.4 oz (88.2 kg)    GENERAL:alert, no distress and comfortable NEURO: alert & oriented x 3 with  fluent speech, no  focal motor/sensory deficits  LABORATORY DATA:  I have reviewed the data as listed    Component Value Date/Time   NA 142 04/17/2020 0900   NA 140 10/09/2017 1416   K 3.8 04/17/2020 0900   K 4.4 10/09/2017 1416   CL 106 04/17/2020 0900   CL 105 01/18/2013 0912   CO2 25 04/17/2020 0900   CO2 25 10/09/2017 1416   GLUCOSE 80 04/17/2020 0900   GLUCOSE 117 10/09/2017 1416   GLUCOSE 83 01/18/2013 0912   BUN 13 04/17/2020 0900   BUN 11.1 10/09/2017 1416   CREATININE 0.86 04/17/2020 0900   CREATININE 0.72 11/01/2018 0903   CREATININE 0.8 10/09/2017 1416   CALCIUM 9.4 04/17/2020 0900   CALCIUM 9.6 10/09/2017 1416   PROT 6.7 04/17/2020 0900   PROT 6.4 10/09/2017 1416   ALBUMIN 4.1 04/17/2020 0900   ALBUMIN 3.4 (L) 10/09/2017 1416   AST 19 04/17/2020 0900   AST 17 11/01/2018 0903   AST 18 10/09/2017 1416   ALT 14 04/17/2020 0900   ALT 15 11/01/2018 0903   ALT 19 10/09/2017 1416   ALKPHOS 67 04/17/2020 0900   ALKPHOS 69 10/09/2017 1416   BILITOT 0.7 04/17/2020 0900   BILITOT 1.3 (H) 11/01/2018 0903   BILITOT 0.57 10/09/2017 1416   GFRNONAA >60 04/17/2020 0900   GFRNONAA >60 11/01/2018 0903   GFRAA >60 04/17/2020 0900   GFRAA >60 11/01/2018 0903    No results found for: SPEP, UPEP  Lab Results  Component Value Date   WBC 4.4 04/17/2020   NEUTROABS 0.8 (L) 04/17/2020   HGB 12.6 04/17/2020   HCT 39.2 04/17/2020   MCV 87.9 04/17/2020   PLT 191 04/17/2020      Chemistry      Component Value Date/Time   NA 142 04/17/2020 0900   NA 140 10/09/2017 1416   K 3.8 04/17/2020 0900   K 4.4 10/09/2017 1416   CL 106 04/17/2020 0900   CL 105 01/18/2013 0912   CO2 25 04/17/2020 0900   CO2 25 10/09/2017 1416   BUN 13 04/17/2020 0900   BUN 11.1 10/09/2017 1416   CREATININE 0.86 04/17/2020 0900   CREATININE 0.72 11/01/2018 0903   CREATININE 0.8 10/09/2017 1416      Component Value Date/Time   CALCIUM 9.4 04/17/2020 0900   CALCIUM 9.6 10/09/2017 1416    ALKPHOS 67 04/17/2020 0900   ALKPHOS 69 10/09/2017 1416   AST 19 04/17/2020 0900   AST 17 11/01/2018 0903   AST 18 10/09/2017 1416   ALT 14 04/17/2020 0900   ALT 15 11/01/2018 0903   ALT 19 10/09/2017 1416   BILITOT 0.7 04/17/2020 0900   BILITOT 1.3 (H) 11/01/2018 0903   BILITOT 0.57 10/09/2017 1416

## 2020-04-25 NOTE — Assessment & Plan Note (Addendum)
I shared my concerns about treatment options Unfortunately, Ofatumumab is no longer available We reviewed the guidelines again, options include Revlimid or Zanubrutinib The patient had treatment with ibrutinib and a Acaabrutinib before.  Hence, I am not in favor of doing zanubrutinib  We discussed the risk, benefits, side effects of Revlimid including risk of pancytopenia and thrombotic risk and she is in agreement to proceed We will get her enrolled with [facility] She will start treatment as soon as possible Due to history of pancytopenia, I plan to start her on a modest dose of 10 mg/day without break If she tolerate that well, we can slowly increase to a maximum of 20 to 25 mg/day Her appointment for second opinion at [facility] is pending I will see her as scheduled on August 4 for further follow-up and toxicity review She will continue Eliquis for thrombotic prevention  If I am not able to get Revlimid approved, the last resort would be to prescribe fludarabine-based treatment

## 2020-04-25 NOTE — Telephone Encounter (Signed)
Oral Oncology Patient Advocate Encounter  Received notification from [place] that prior authorization for Revlimid is required.  PA submitted on CoverMyMeds Key [id number] Status is pending  Oral Oncology Clinic will continue to follow.  [place] Patient [place] Phone [phone] Fax [phone] 04/25/2020 10:07 AM

## 2020-04-25 NOTE — Assessment & Plan Note (Signed)
Discussed goals of care Treatment goal is palliative Unfortunately, the patient have exhausted most treatment options I recommend referral to [facility] for second opinion and treatment options In the meantime, we will proceed with chemotherapy as soon as possible

## 2020-04-25 NOTE — Telephone Encounter (Signed)
Per 7/21 los, no changes made to pt schedule  

## 2020-04-25 NOTE — Telephone Encounter (Signed)
Oral Oncology Patient Advocate Encounter  Prior Authorization for Revlimid has been approved.    PA# [id number] Effective dates: 04/25/20 through 04/25/21  Patients co-pay is $200  Oral Oncology Clinic will continue to follow.   [place] Patient [place] Phone [phone] Fax [phone] 04/25/2020 4:15 PM

## 2020-04-25 NOTE — Telephone Encounter (Signed)
Oral Oncology Pharmacist Encounter  Received new prescription for Revlimid (lenalidomide) for the treatment of CLL, planned duration until disease progression or unacceptable drug toxicity.  CMP from 04/17/20 assessed, no relevant lab abnormalities. Prescription dose and frequency assessed. MD plans to dose increase as tolerated.  Current medication list in Epic reviewed, no DDIs with lenalidomide identified.  Prescription has been e-scribed to [place] for benefits analysis and approval.  Oral Oncology Clinic will continue to follow for insurance authorization, copayment issues, initial counseling and start date.  Darl Pikes, PharmD, BCPS, BCOP, CPP Hematology/Oncology Clinical Pharmacist Practitioner [facility] [phone]  04/25/2020 11:01 AM

## 2020-04-26 MED ORDER — LENALIDOMIDE 10 MG PO CAPS: 10.0000 mg | ORAL_CAPSULE | Freq: Every day | ORAL | 0 refills | Status: DC

## 2020-04-26 NOTE — Telephone Encounter (Signed)
Oral Chemotherapy Pharmacist Encounter  Due to insurance restriction the medication could not be filled at [facility]. Prescription has been e-scribed to [facility].  Supportive information was faxed to [facility]. We will continue to follow medication access.   Called and notified patient, provided with new pharmacy number.  Darl Pikes, PharmD, BCPS, [facility] Hematology/Oncology Clinical Pharmacist [facility] [phone]  04/26/2020 1:32 PM

## 2020-04-29 ENCOUNTER — Other Ambulatory Visit: Payer: Self-pay | Admitting: Cardiology

## 2020-04-30 ENCOUNTER — Other Ambulatory Visit: Payer: Self-pay | Admitting: Hematology and Oncology

## 2020-04-30 DIAGNOSIS — C9110 Chronic lymphocytic leukemia of B-cell type not having achieved remission: Secondary | ICD-10-CM

## 2020-04-30 MED ORDER — LENALIDOMIDE 10 MG PO CAPS: 10.0000 mg | ORAL_CAPSULE | Freq: Every day | ORAL | 0 refills | Status: DC

## 2020-04-30 NOTE — Telephone Encounter (Signed)
Prescription refill request for Eliquis received.  Last office visit: 08/30/2019, Camnitz Scr:  0.86, 04/17/2020 Age:  59 y.o. Weight: 88.2 kg   Prescription refill sent.

## 2020-05-07 ENCOUNTER — Other Ambulatory Visit: Payer: Self-pay

## 2020-05-07 ENCOUNTER — Encounter (HOSPITAL_COMMUNITY): Payer: Self-pay

## 2020-05-07 ENCOUNTER — Emergency Department (HOSPITAL_COMMUNITY): Payer: BC Managed Care – PPO

## 2020-05-07 ENCOUNTER — Inpatient Hospital Stay (HOSPITAL_COMMUNITY)
Admission: EM | Admit: 2020-05-07 | Discharge: 2020-05-12 | DRG: 871 | Disposition: A | Discharge status: Home / Self Care | Payer: BC Managed Care – PPO | Attending: Family Medicine | Admitting: Family Medicine

## 2020-05-07 DIAGNOSIS — E872 Acidosis: Secondary | ICD-10-CM | POA: Diagnosis not present

## 2020-05-07 DIAGNOSIS — J1282 Pneumonia due to coronavirus disease 2019: Secondary | ICD-10-CM | POA: Diagnosis not present

## 2020-05-07 DIAGNOSIS — R1310 Dysphagia, unspecified: Secondary | ICD-10-CM | POA: Diagnosis not present

## 2020-05-07 DIAGNOSIS — E669 Obesity, unspecified: Secondary | ICD-10-CM | POA: Diagnosis present

## 2020-05-07 DIAGNOSIS — D649 Anemia, unspecified: Secondary | ICD-10-CM | POA: Diagnosis present

## 2020-05-07 DIAGNOSIS — A0839 Other viral enteritis: Secondary | ICD-10-CM | POA: Diagnosis not present

## 2020-05-07 DIAGNOSIS — A4189 Other specified sepsis: Secondary | ICD-10-CM | POA: Diagnosis not present

## 2020-05-07 DIAGNOSIS — A09 Infectious gastroenteritis and colitis, unspecified: Secondary | ICD-10-CM | POA: Diagnosis not present

## 2020-05-07 DIAGNOSIS — C9110 Chronic lymphocytic leukemia of B-cell type not having achieved remission: Secondary | ICD-10-CM | POA: Diagnosis not present

## 2020-05-07 DIAGNOSIS — I4819 Other persistent atrial fibrillation: Secondary | ICD-10-CM | POA: Diagnosis present

## 2020-05-07 DIAGNOSIS — R062 Wheezing: Secondary | ICD-10-CM | POA: Diagnosis not present

## 2020-05-07 DIAGNOSIS — J80 Acute respiratory distress syndrome: Secondary | ICD-10-CM | POA: Diagnosis not present

## 2020-05-07 DIAGNOSIS — Z8249 Family history of ischemic heart disease and other diseases of the circulatory system: Secondary | ICD-10-CM

## 2020-05-07 DIAGNOSIS — F329 Major depressive disorder, single episode, unspecified: Secondary | ICD-10-CM | POA: Diagnosis not present

## 2020-05-07 DIAGNOSIS — Z79899 Other long term (current) drug therapy: Secondary | ICD-10-CM

## 2020-05-07 DIAGNOSIS — U071 COVID-19: Secondary | ICD-10-CM

## 2020-05-07 DIAGNOSIS — Z7901 Long term (current) use of anticoagulants: Secondary | ICD-10-CM

## 2020-05-07 DIAGNOSIS — R509 Fever, unspecified: Secondary | ICD-10-CM | POA: Diagnosis not present

## 2020-05-07 DIAGNOSIS — I428 Other cardiomyopathies: Secondary | ICD-10-CM | POA: Diagnosis not present

## 2020-05-07 DIAGNOSIS — F32A Depression, unspecified: Secondary | ICD-10-CM | POA: Diagnosis present

## 2020-05-07 DIAGNOSIS — D696 Thrombocytopenia, unspecified: Secondary | ICD-10-CM | POA: Diagnosis not present

## 2020-05-07 DIAGNOSIS — I480 Paroxysmal atrial fibrillation: Secondary | ICD-10-CM | POA: Diagnosis not present

## 2020-05-07 DIAGNOSIS — R739 Hyperglycemia, unspecified: Secondary | ICD-10-CM | POA: Diagnosis not present

## 2020-05-07 DIAGNOSIS — I10 Essential (primary) hypertension: Secondary | ICD-10-CM | POA: Diagnosis not present

## 2020-05-07 DIAGNOSIS — Z6830 Body mass index (BMI) 30.0-30.9, adult: Secondary | ICD-10-CM | POA: Diagnosis not present

## 2020-05-07 DIAGNOSIS — E785 Hyperlipidemia, unspecified: Secondary | ICD-10-CM | POA: Diagnosis present

## 2020-05-07 DIAGNOSIS — D849 Immunodeficiency, unspecified: Secondary | ICD-10-CM | POA: Diagnosis present

## 2020-05-07 DIAGNOSIS — I517 Cardiomegaly: Secondary | ICD-10-CM | POA: Diagnosis not present

## 2020-05-07 DIAGNOSIS — E876 Hypokalemia: Secondary | ICD-10-CM | POA: Diagnosis not present

## 2020-05-07 DIAGNOSIS — J069 Acute upper respiratory infection, unspecified: Secondary | ICD-10-CM

## 2020-05-07 DIAGNOSIS — D709 Neutropenia, unspecified: Secondary | ICD-10-CM | POA: Diagnosis not present

## 2020-05-07 LAB — LIPASE, BLOOD: Lipase: 23 U/L (ref 11–51)

## 2020-05-07 LAB — URINALYSIS, ROUTINE W REFLEX MICROSCOPIC
Bacteria, UA: NONE SEEN
Bilirubin Urine: NEGATIVE
Glucose, UA: NEGATIVE mg/dL
Ketones, ur: 20 mg/dL — AB
Leukocytes,Ua: NEGATIVE
Nitrite: NEGATIVE
Protein, ur: 30 mg/dL — AB
Specific Gravity, Urine: 1.011 (ref 1.005–1.030)
pH: 5 (ref 5.0–8.0)

## 2020-05-07 LAB — C-REACTIVE PROTEIN: CRP: 31.4 mg/dL — ABNORMAL HIGH (ref <1.0)

## 2020-05-07 LAB — CBC
HCT: 37.7 % (ref 36.0–46.0)
Hemoglobin: 12.4 g/dL (ref 12.0–15.0)
MCH: 28.9 pg (ref 26.0–34.0)
MCHC: 32.9 g/dL (ref 30.0–36.0)
MCV: 87.9 fL (ref 80.0–100.0)
Platelets: 154 10*3/uL (ref 150–400)
RBC: 4.29 MIL/uL (ref 3.87–5.11)
RDW: 13.6 % (ref 11.5–15.5)
WBC: 4.5 10*3/uL (ref 4.0–10.5)
nRBC: 0 % (ref 0.0–0.2)

## 2020-05-07 LAB — COMPREHENSIVE METABOLIC PANEL
ALT: 20 U/L (ref 0–44)
AST: 36 U/L (ref 15–41)
Albumin: 4.2 g/dL (ref 3.5–5.0)
Alkaline Phosphatase: 41 U/L (ref 38–126)
Anion gap: 15 (ref 5–15)
BUN: 16 mg/dL (ref 6–20)
CO2: 21 mmol/L — ABNORMAL LOW (ref 22–32)
Calcium: 8.7 mg/dL — ABNORMAL LOW (ref 8.9–10.3)
Chloride: 102 mmol/L (ref 98–111)
Creatinine, Ser: 0.92 mg/dL (ref 0.44–1.00)
GFR calc Af Amer: >60 mL/min (ref >60)
GFR calc non Af Amer: >60 mL/min (ref >60)
Glucose, Bld: 119 mg/dL — ABNORMAL HIGH (ref 70–99)
Potassium: 2.7 mmol/L — CL (ref 3.5–5.1)
Sodium: 138 mmol/L (ref 135–145)
Total Bilirubin: 2.2 mg/dL — ABNORMAL HIGH (ref 0.3–1.2)
Total Protein: 7.3 g/dL (ref 6.5–8.1)

## 2020-05-07 LAB — FIBRINOGEN: Fibrinogen: 726 mg/dL — ABNORMAL HIGH (ref 210–475)

## 2020-05-07 LAB — I-STAT BETA HCG BLOOD, ED (MC, WL, AP ONLY): I-stat hCG, quantitative: <5 m[IU]/mL (ref <5)

## 2020-05-07 LAB — FERRITIN: Ferritin: 2519 ng/mL — ABNORMAL HIGH (ref 11–307)

## 2020-05-07 LAB — POC SARS CORONAVIRUS 2 AG -  ED: SARS Coronavirus 2 Ag: POSITIVE — AB

## 2020-05-07 LAB — PROTIME-INR
INR: 1.7 — ABNORMAL HIGH (ref 0.8–1.2)
Prothrombin Time: 19.5 seconds — ABNORMAL HIGH (ref 11.4–15.2)

## 2020-05-07 LAB — ABO/RH: ABO/RH(D): A POS

## 2020-05-07 LAB — LACTATE DEHYDROGENASE: LDH: 593 U/L — ABNORMAL HIGH (ref 98–192)

## 2020-05-07 LAB — TRIGLYCERIDES: Triglycerides: 132 mg/dL (ref <150)

## 2020-05-07 LAB — D-DIMER, QUANTITATIVE: D-Dimer, Quant: 1.36 ug/mL-FEU — ABNORMAL HIGH (ref 0.00–0.50)

## 2020-05-07 LAB — LACTIC ACID, PLASMA: Lactic Acid, Venous: 1.2 mmol/L (ref 0.5–1.9)

## 2020-05-07 LAB — APTT: aPTT: 50 seconds — ABNORMAL HIGH (ref 24–36)

## 2020-05-07 LAB — PROCALCITONIN: Procalcitonin: 4.49 ng/mL

## 2020-05-07 MED ORDER — LACTATED RINGERS IV BOLUS (SEPSIS): 1000.0000 mL | Freq: Once | INTRAVENOUS | Status: DC

## 2020-05-07 MED ORDER — ACETAMINOPHEN 325 MG PO TABS
650.0000 mg | ORAL_TABLET | Freq: Four times a day (QID) | ORAL | Status: DC | PRN
  Administered 2020-05-08 – 2020-05-09 (×2): 650 mg via ORAL
  Filled 2020-05-07 (×2): qty 2

## 2020-05-07 MED ORDER — AEROCHAMBER Z-STAT PLUS/MEDIUM MISC
1.0000 | Freq: Once | Status: AC
  Administered 2020-05-07: 1
  Filled 2020-05-07: qty 1

## 2020-05-07 MED ORDER — POTASSIUM CHLORIDE 10 MEQ/100ML IV SOLN
10.0000 meq | Freq: Once | INTRAVENOUS | Status: AC
  Administered 2020-05-07: 10 meq via INTRAVENOUS
  Filled 2020-05-07: qty 100

## 2020-05-07 MED ORDER — SODIUM CHLORIDE 0.9 % IV SOLN
100.0000 mg | Freq: Every day | INTRAVENOUS | Status: AC
  Administered 2020-05-08 – 2020-05-11 (×4): 100 mg via INTRAVENOUS
  Filled 2020-05-07 (×5): qty 20

## 2020-05-07 MED ORDER — POTASSIUM CHLORIDE CRYS ER 20 MEQ PO TBCR
30.0000 meq | EXTENDED_RELEASE_TABLET | ORAL | Status: AC
  Administered 2020-05-07 (×2): 30 meq via ORAL
  Filled 2020-05-07 (×2): qty 1

## 2020-05-07 MED ORDER — ALBUTEROL SULFATE HFA 108 (90 BASE) MCG/ACT IN AERS
2.0000 | INHALATION_SPRAY | Freq: Four times a day (QID) | RESPIRATORY_TRACT | Status: DC
  Administered 2020-05-07 – 2020-05-08 (×4): 2 via RESPIRATORY_TRACT
  Filled 2020-05-07 (×4): qty 6.7

## 2020-05-07 MED ORDER — SODIUM CHLORIDE 0.9% FLUSH
3.0000 mL | Freq: Once | INTRAVENOUS | Status: AC
  Administered 2020-05-07: 3 mL via INTRAVENOUS

## 2020-05-07 MED ORDER — ALBUTEROL SULFATE HFA 108 (90 BASE) MCG/ACT IN AERS
4.0000 | INHALATION_SPRAY | Freq: Once | RESPIRATORY_TRACT | Status: AC
  Administered 2020-05-07: 4 via RESPIRATORY_TRACT
  Filled 2020-05-07: qty 6.7

## 2020-05-07 MED ORDER — ONDANSETRON HCL 4 MG PO TABS: 4.0000 mg | ORAL_TABLET | Freq: Four times a day (QID) | ORAL | Status: DC | PRN

## 2020-05-07 MED ORDER — VALGANCICLOVIR HCL 450 MG PO TABS
900.0000 mg | ORAL_TABLET | Freq: Every day | ORAL | Status: DC
  Administered 2020-05-08 – 2020-05-12 (×5): 900 mg via ORAL
  Filled 2020-05-07 (×9): qty 2

## 2020-05-07 MED ORDER — VANCOMYCIN HCL IN DEXTROSE 1-5 GM/200ML-% IV SOLN
1000.0000 mg | Freq: Once | INTRAVENOUS | Status: DC
  Filled 2020-05-07: qty 200

## 2020-05-07 MED ORDER — SODIUM CHLORIDE 0.9 % IV SOLN
2.0000 g | Freq: Three times a day (TID) | INTRAVENOUS | Status: DC
  Administered 2020-05-07 – 2020-05-12 (×13): 2 g via INTRAVENOUS
  Filled 2020-05-07 (×16): qty 2

## 2020-05-07 MED ORDER — ONDANSETRON HCL 4 MG/2ML IJ SOLN: 4.0000 mg | Freq: Four times a day (QID) | INTRAMUSCULAR | Status: DC | PRN

## 2020-05-07 MED ORDER — SODIUM CHLORIDE 0.9 % IV SOLN
100.0000 mg | INTRAVENOUS | Status: AC
  Administered 2020-05-07 (×2): 100 mg via INTRAVENOUS
  Filled 2020-05-07 (×2): qty 20

## 2020-05-07 MED ORDER — DILTIAZEM HCL-DEXTROSE 125-5 MG/125ML-% IV SOLN (PREMIX)
5.0000 mg/h | INTRAVENOUS | Status: DC
  Administered 2020-05-07: 5 mg/h via INTRAVENOUS
  Filled 2020-05-07: qty 125

## 2020-05-07 MED ORDER — SODIUM CHLORIDE 0.9 % IV SOLN
2.0000 g | Freq: Once | INTRAVENOUS | Status: AC
  Administered 2020-05-07: 2 g via INTRAVENOUS
  Filled 2020-05-07: qty 2

## 2020-05-07 MED ORDER — METOPROLOL SUCCINATE ER 50 MG PO TB24
150.0000 mg | ORAL_TABLET | Freq: Every day | ORAL | Status: DC
  Administered 2020-05-08 – 2020-05-12 (×5): 150 mg via ORAL
  Filled 2020-05-07 (×5): qty 1

## 2020-05-07 MED ORDER — ACETAMINOPHEN 325 MG PO TABS
650.0000 mg | ORAL_TABLET | Freq: Once | ORAL | Status: AC | PRN
  Administered 2020-05-07: 650 mg via ORAL
  Filled 2020-05-07: qty 2

## 2020-05-07 MED ORDER — VANCOMYCIN HCL IN DEXTROSE 1-5 GM/200ML-% IV SOLN
1000.0000 mg | Freq: Two times a day (BID) | INTRAVENOUS | Status: DC
  Administered 2020-05-07 – 2020-05-09 (×4): 1000 mg via INTRAVENOUS
  Filled 2020-05-07 (×6): qty 200

## 2020-05-07 MED ORDER — VITAMIN D 25 MCG (1000 UNIT) PO TABS
1000.0000 [IU] | ORAL_TABLET | Freq: Every day | ORAL | Status: DC
  Administered 2020-05-08 – 2020-05-12 (×5): 1000 [IU] via ORAL
  Filled 2020-05-07 (×5): qty 1

## 2020-05-07 MED ORDER — VANCOMYCIN HCL 1750 MG/350ML IV SOLN
1750.0000 mg | Freq: Once | INTRAVENOUS | Status: AC
  Administered 2020-05-07: 1750 mg via INTRAVENOUS
  Filled 2020-05-07: qty 350

## 2020-05-07 MED ORDER — DILTIAZEM LOAD VIA INFUSION
15.0000 mg | Freq: Once | INTRAVENOUS | Status: AC
  Administered 2020-05-07: 15 mg via INTRAVENOUS
  Filled 2020-05-07: qty 15

## 2020-05-07 MED ORDER — DEXAMETHASONE SODIUM PHOSPHATE 10 MG/ML IJ SOLN
10.0000 mg | Freq: Once | INTRAMUSCULAR | Status: AC
  Administered 2020-05-07: 10 mg via INTRAVENOUS
  Filled 2020-05-07: qty 1

## 2020-05-07 MED ORDER — SACUBITRIL-VALSARTAN 49-51 MG PO TABS
1.0000 | ORAL_TABLET | Freq: Two times a day (BID) | ORAL | Status: DC
  Administered 2020-05-07 – 2020-05-12 (×11): 1 via ORAL
  Filled 2020-05-07 (×13): qty 1

## 2020-05-07 MED ORDER — APIXABAN 5 MG PO TABS
5.0000 mg | ORAL_TABLET | Freq: Two times a day (BID) | ORAL | Status: DC
  Administered 2020-05-07 – 2020-05-12 (×10): 5 mg via ORAL
  Filled 2020-05-07 (×12): qty 1

## 2020-05-07 MED ORDER — PANTOPRAZOLE SODIUM 40 MG PO TBEC
40.0000 mg | DELAYED_RELEASE_TABLET | Freq: Two times a day (BID) | ORAL | Status: DC
  Administered 2020-05-07 – 2020-05-12 (×11): 40 mg via ORAL
  Filled 2020-05-07 (×11): qty 1

## 2020-05-07 MED ORDER — LACTATED RINGERS IV SOLN: INTRAVENOUS | Status: AC

## 2020-05-07 MED ORDER — SULFAMETHOXAZOLE-TRIMETHOPRIM 400-80 MG PO TABS
1.0000 | ORAL_TABLET | Freq: Every day | ORAL | Status: DC
  Administered 2020-05-08 – 2020-05-12 (×5): 1 via ORAL
  Filled 2020-05-07 (×7): qty 1

## 2020-05-07 MED ORDER — SENNOSIDES-DOCUSATE SODIUM 8.6-50 MG PO TABS
1.0000 | ORAL_TABLET | Freq: Every evening | ORAL | Status: DC | PRN
  Filled 2020-05-07: qty 1

## 2020-05-07 MED ORDER — ZINC SULFATE 220 (50 ZN) MG PO CAPS
220.0000 mg | ORAL_CAPSULE | Freq: Every day | ORAL | Status: DC
  Administered 2020-05-07 – 2020-05-12 (×6): 220 mg via ORAL
  Filled 2020-05-07 (×6): qty 1

## 2020-05-07 MED ORDER — DEXAMETHASONE SODIUM PHOSPHATE 10 MG/ML IJ SOLN
6.0000 mg | INTRAMUSCULAR | Status: DC
  Administered 2020-05-08: 6 mg via INTRAVENOUS
  Filled 2020-05-07 (×2): qty 1

## 2020-05-07 MED ORDER — ASCORBIC ACID 500 MG PO TABS
500.0000 mg | ORAL_TABLET | Freq: Every day | ORAL | Status: DC
  Administered 2020-05-07 – 2020-05-12 (×6): 500 mg via ORAL
  Filled 2020-05-07 (×6): qty 1

## 2020-05-07 NOTE — ED Triage Notes (Addendum)
Patient c/o fever, diarrhea, body aches, nausea x 3 days. Patient is a cancer patient and is taking a chemo pill. Patient has had a Covid vaccine.  Patient stated during triage that her daughter went to [place] and when she returned she was Covid +

## 2020-05-07 NOTE — ED Notes (Signed)
Patients heart rate elevated to 152, EKG taken and documented. Cardizem restarted.

## 2020-05-07 NOTE — ED Notes (Signed)
Ardith Dark messaged regarding pt's HR 130's-150's in afib RVR. Diltizem drip titrated to 15mg /hr, will reassess in 1hr.

## 2020-05-07 NOTE — Progress Notes (Signed)
A consult was received from an ED provider for vancomycin and cefepime per pharmacy dosing.  The patient's profile has been reviewed for ht/wt/allergies/indication/available labs.    A one time order has been placed for Cefepime 2 g IV once + vancomycin 1750 mg IV one.    Further antibiotics/pharmacy consults should be ordered by admitting physician if indicated.                       Thank you, Lenis Noon, PharmD 05/07/2020  9:26 AM

## 2020-05-07 NOTE — Progress Notes (Signed)
Pharmacy Antibiotic Note  Audrey Gross is a 59 y.o. female with a h/o CLL on Revlimid admitted on 05/07/2020 with COVID PNA.  Pharmacy has been consulted for cefepime and vancomycin dosing for immunocompromised state.  Plan: Cefepime 2 g iv q 8 hours.   Vancomycin 1750 mg iv once followed by 1000 mg iv q 12 hours.   F/U renal function, culture results, clinical course Levels as indicated  Height: 5\' 7"  (170.2 cm) Weight: 88.2 kg (194 lb 6.4 oz) IBW/kg (Calculated) : 61.6  Temp (24hrs), Avg:100 F (37.8 C), Min:98.5 F (36.9 C), Max:101.3 F (38.5 C)  Recent Labs  Lab 05/07/20 0926 05/07/20 0927  WBC  --  4.5  CREATININE  --  0.92  LATICACIDVEN 1.2  --     Estimated Creatinine Clearance: 75 mL/min (by C-G formula based on SCr of 0.92 mg/dL).    No Known Allergies  Antimicrobials this admission: 8/2 cefepime >>  8/2 vancomycin >>   8/2 remdesivir >>  Dose adjustments this admission:  Microbiology results: 8/2 BCx: sent 8/2 UCx: sent   8/2 COVID Ag: positive  Thank you for allowing pharmacy to be a part of this patient's care.  Napoleon Form 05/07/2020 12:59 PM

## 2020-05-07 NOTE — ED Notes (Signed)
POC COIVD was positive, PA notified.

## 2020-05-07 NOTE — ED Notes (Signed)
Patient took her home medications that she brought with her with applesauce.

## 2020-05-07 NOTE — H&P (Signed)
History and Physical    Audrey Gross WPY:[id number] DOB: Sep 18, 1961 DOA: 05/07/2020  PCP: Janith Lima, MD   Patient coming from: Home  Chief Complaint  Patient presents with  . Fever  . Generalized Body Aches  . Cancer patient  . Diarrhea      HPI: Audrey Gross is a 59 y.o. 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 fib on Eliquis, dyslipidemia, hypertension, obesity, history of NICM , MR comes to the ED for admission of nausea vomiting cough for 3 days patient reports symptoms started and has been gradually worsening.  She complains of feeling warm but did not record temperature at home, complains of nonbloody diarrhea, body aches fatigue cough and also shortness of breath.  Covid vaccine in July 17 second dose, reports her daughter is unvaccinated and returned home from [place] recently and tested positive for COVID-19.  ED Course: Febrile up to 101.3, respiration 22-36, saturating up to 93 to 95% on room air, blood pressure stable heart rate in 50s to 105.  Routine blood work showed hypokalemia 2.7, bicarb 21, bilirubin 2.2, CBC stable, LDH 593 lactic acid 1.2, procalcitonin D-dimer 1.3, procalcitonin and and CRP pending. Initially patient received vancomycin and cefepime, later on Covid Ag came back positive and remdesivir has been ordered.  Admission has been requested for further management. CXR shows 'Left lower lobe opacity concerning for pneumonia" Currently of appears comfortable on room air.  Not in acute distress but appears anxious.  Review of Systems: All systems were reviewed and were negative except as mentioned in HPI above. Negative for focal weakness Negative for chest pain Negative for abdomen pain  Past Medical History:  Diagnosis Date  . Atrial fibrillation ([place])   . Cystic breast   . Depression   . Diffuse cystic mastopathy   . Dyslipidemia (high LDL; low HDL)   . Hypertension   . Iron deficiency anemia, unspecified   .  Lymphadenopathy of head and neck 04/05/2015  . Medical non-compliance   . Mitral regurgitation 02/2017   moderate to severe  . Persistent atrial fibrillation with rapid ventricular response ([place]) 07/15/2017    Past Surgical History:  Procedure Laterality Date  . BIOPSY  10/17/2019   Procedure: BIOPSY;  Surgeon: Jerene Bears, MD;  Location: [facility] ENDOSCOPY;  Service: Endoscopy;;  . CARDIOVERSION N/A 04/02/2017   Procedure: CARDIOVERSION;  Surgeon: Dorothy Spark, MD;  Location: [facility] ENDOSCOPY;  Service: Cardiovascular;  Laterality: N/A;  . CARDIOVERSION N/A 08/19/2017   Procedure: CARDIOVERSION;  Surgeon: Larey Dresser, MD;  Location: [facility] ENDOSCOPY;  Service: Cardiovascular;  Laterality: N/A;  . ESOPHAGOGASTRODUODENOSCOPY (EGD) WITH PROPOFOL N/A 10/17/2019   Procedure: ESOPHAGOGASTRODUODENOSCOPY (EGD) WITH PROPOFOL;  Surgeon: Jerene Bears, MD;  Location: [facility] ENDOSCOPY;  Service: Endoscopy;  Laterality: N/A;  . IR IMAGING GUIDED PORT INSERTION  06/29/2018  . TUBAL LIGATION       reports that she has never smoked. She has never used smokeless tobacco. She reports that she does not drink alcohol and does not use drugs.  No Known Allergies  Family History  Problem Relation Age of Onset  . Sudden death Mother   . Kidney disease Father        H/O HD and kidney transplant  . Stomach cancer Maternal Aunt   . Stomach cancer Paternal Grandmother   . Heart disease Brother   . Heart disease Maternal Grandmother      Prior to Admission medications   Medication  Sig Start Date End Date Taking? Authorizing Provider  Cholecalciferol (VITAMIN D-3) 1000 units CAPS Take 1,000 Units by mouth daily with breakfast.    [provider]  ELIQUIS 5 MG TABS tablet TAKE 1 TABLET BY MOUTH TWICE A DAY 04/30/20   Camnitz, Ocie Doyne, MD  lenalidomide (REVLIMID) 10 MG capsule Take 1 capsule (10 mg total) by mouth daily. 04/30/20   Heath Lark, MD  lidocaine-prilocaine (EMLA) cream Apply 1 application  topically as needed. Patient taking differently: Apply 1 application topically as needed (acess port).  06/04/18   Heath Lark, MD  metoprolol succinate (TOPROL-XL) 100 MG 24 hr tablet TAKE 1 AND 1/2 TABLETS BY MOUTH EVERY DAY. TAKE WITH FOOD OR RIGHT AFTER A MEAL 08/05/19   Camnitz, Ocie Doyne, MD  ondansetron (ZOFRAN-ODT) 8 MG disintegrating tablet Take 1 tablet (8 mg total) by mouth every 8 (eight) hours as needed for nausea or vomiting. 10/07/19   Jaynee Eagles, PA-C  pantoprazole (PROTONIX) 40 MG tablet TAKE 1 TABLET BY MOUTH TWICE A DAY 01/12/20   Heath Lark, MD  sacubitril-valsartan (ENTRESTO) 49-51 MG Take 1 tablet by mouth 2 (two) times daily. 03/26/20   Camnitz, Ocie Doyne, MD  sulfamethoxazole-trimethoprim (BACTRIM) 400-80 MG tablet TAKE 1 TABLET BY MOUTH DAILY. 03/28/20   Heath Lark, MD  valGANciclovir (VALCYTE) 450 MG tablet TAKE 2 TABLETS (900 MG TOTAL) BY MOUTH DAILY. 03/28/20   Heath Lark, MD    Physical Exam: Vitals:   05/07/20 1100 05/07/20 1115 05/07/20 1200 05/07/20 1230  BP: 134/88  (!) 145/92 (!) 157/92  Pulse: (!) 104  87 93  Resp: (!) 33  (!) 28 (!) 26  Temp:  99.5 F (37.5 C)    TempSrc:  Oral    SpO2: 97%  95% 97%  Weight:      Height:        General exam: AAOx3, anxious,weak appearing. HEENT:Oral mucosa moist, Ear/Nose WNL grossly, dentition normal. Respiratory system: bilaterally basal crackles present, no use of accessory muscle Cardiovascular system: S1 & S2 +, No JVD,. Gastrointestinal system: Abdomen soft, NT,ND, BS+ Nervous System:Alert, awake, moving extremities and grossly nonfocal Extremities: No edema, distal peripheral pulses palpable.  Skin: No rashes,no icterus. MSK: Normal muscle bulk,tone, power    Labs on Admission: I have personally reviewed following labs and imaging studies  CBC: Recent Labs  Lab 05/07/20 0927  WBC 4.5  HGB 12.4  HCT 37.7  MCV 87.9  PLT 160   Basic Metabolic Panel: Recent Labs  Lab 05/07/20 0927  NA 138  K  2.7*  CL 102  CO2 21*  GLUCOSE 119*  BUN 16  CREATININE 0.92  CALCIUM 8.7*   GFR: Estimated Creatinine Clearance: 75 mL/min (by C-G formula based on SCr of 0.92 mg/dL). Liver Function Tests: Recent Labs  Lab 05/07/20 0927  AST 36  ALT 20  ALKPHOS 41  BILITOT 2.2*  PROT 7.3  ALBUMIN 4.2   Recent Labs  Lab 05/07/20 0927  LIPASE 23   No results for input(s): AMMONIA in the last 168 hours. Coagulation Profile: Recent Labs  Lab 05/07/20 0926  INR 1.7*   Cardiac Enzymes: No results for input(s): CKTOTAL, CKMB, CKMBINDEX, TROPONINI in the last 168 hours. BNP (last 3 results) No results for input(s): PROBNP in the last 8760 hours. HbA1C: No results for input(s): HGBA1C in the last 72 hours. CBG: No results for input(s): GLUCAP in the last 168 hours. Lipid Profile: Recent Labs    05/07/20 0926  TRIG 132  Thyroid Function Tests: No results for input(s): TSH, T4TOTAL, FREET4, T3FREE, THYROIDAB in the last 72 hours. Anemia Panel: Recent Labs    05/07/20 1026  FERRITIN 2,519*   Urine analysis:    Component Value Date/Time   COLORURINE YELLOW 10/16/2019 1034   APPEARANCEUR HAZY (A) 10/16/2019 1034   LABSPEC 1.012 10/16/2019 1034   PHURINE 5.0 10/16/2019 [place] 10/16/2019 1034   GLUCOSEU NEGATIVE 07/26/2013 0934   HGBUR NEGATIVE 10/16/2019 1034   BILIRUBINUR NEGATIVE 10/16/2019 1034   KETONESUR 20 (A) 10/16/2019 1034   PROTEINUR NEGATIVE 10/16/2019 1034   UROBILINOGEN 0.2 07/26/2013 0934   NITRITE NEGATIVE 10/16/2019 1034   LEUKOCYTESUR TRACE (A) 10/16/2019 1034    Radiological Exams on Admission: DG Chest Portable 1 View  Result Date: 05/07/2020 CLINICAL DATA:  Fever EXAM: PORTABLE CHEST 1 VIEW COMPARISON:  10/02/2017 FINDINGS: Right Port-A-Cath is in place with the tip at the cavoatrial junction. Mild cardiomegaly. Left lower lobe airspace opacity. Right lung clear. No effusions or acute bony abnormality. IMPRESSION: Left lower lobe  opacity concerning for pneumonia. Cardiomegaly. Electronically Signed   By: Rolm Baptise M.D.   On: 05/07/2020 09:20     Assessment/Plan  Pneumonia due to COVID-19 virus: We will admit the patient continue on remdesivir, Decadron, borderline hypoxic at 92% not in acute distress.  Continue inhaler, airborne precautions, gentle hydration.  Procalcitonin elevated at 4.4, in the setting of CLL immunocompromised state patient has been on Bactrim and antiviral medication at home.  We will keep on empiric vancomycin cefepime for now due to concern for immunocompromise status and secondary bacterial infection and de-escalate antibiotics as able. Trend procalcitonin level.  Monitor inflammatory markers.  Hypokalemia we will replete po and iv. monitor labs.  CLL recently started on chemo Revlimid - low-dose 10 mg, follows Dr Alvy Bimler.  Notiied Dr Alvy Bimler and advised to hold chemo for now. Need op follow up in future to resume  Essential hypertension: Blood pressure stable.  On metoprolol for A. fib.  NICM/MR: On metoprolol 150 mg, and Entresto at home.  Continue home meds with holding parameters.  YBW:LSLHTDS on Eliquis/metoprolol.  She went into rapid atrial fibrillation with heart rate in 150s EKG reviewed.  Ordered Cardizem bolus 15 mg x 1 and infusion with conversion to NSR-she was able to take her oral metoprolol earlier- hopefully we can stop Cardizem drip soon.  Body mass index is 30.45 kg/m.   The treatment plan and use of medications and known side effects were discussed with patient/family, they were clearly explained that there is no proven definitive treatment for COVID-19 infection, any medications used here are based on published clinical articles/anecdotal data which are not peer-reviewed or randomized control trials.  Complete risks and long-term side effects are unknown, however in the best clinical judgment they seem to be of some clinical benefit rather than medical risks.   Patient/family agree with the treatment plan and want to receive the given medications.  Severity of Illness: * I certify that at the point of admission it is my clinical judgment that the patient will require inpatient hospital care spanning beyond 2 midnights from the point of admission due to high intensity of service, high risk for further deterioration and high frequency of surveillance required due to Covid pneumonia.*    DVT prophylaxis:  apixaban (ELIQUIS) tablet 5 mg   Code Status:   Code Status: Full Code Family Communication: Admission, patients condition and plan of care including tests being ordered have been  discussed with the patient who indicate understanding and agree with the plan and Code Status.  Consults called:  Notified Dr Alvy Bimler and advised to hold her PO chemo  Antonieta Pert MD [facility]  If 7PM-7AM, please contact night-coverage [email or web address]  05/07/2020, 1:01 PM

## 2020-05-07 NOTE — ED Notes (Signed)
Pt stated that she is to weak to ambulate at this time.  Will try again in 30 minutes.

## 2020-05-07 NOTE — ED Provider Notes (Signed)
Audrey Gross Provider Note   CSN: [id number] Arrival date & time: 05/07/20  0800     History Chief Complaint  Patient presents with   Fever   Generalized Body Aches   Cancer patient   Diarrhea    Audrey Gross is a 59 y.o. 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 fib on Eliquis, dyslipidemia, hypertension, obesity.  Patient presents today for fever, body aches, diarrhea, nausea without vomiting and nonproductive cough onset 3 days ago.  Patient reports symptoms gradually worsening since onset no medications prior to arrival.  Patient reports feeling warm at home meds not measured temperature.  Reports nonbloody diarrhea.  Describes body aches as a diffuse aching sensation moderate nonradiating no clear aggravating or alleviating factors.  She reports generalized fatigue worsened with movement and activity no associated chest pain.  She reports with coughing and activity she feels out of breath.  Patient reports that she has received her second [place] on April 21, 2020.  She reports her daughter who is unvaccinated returned home from [place] recently and tested positive for COVID-19.  Patient believes that exposure has led to her symptoms today.  Denies headache, neck stiffness, sore throat, hemoptysis, chest pain, extremity swelling/color change, abdominal pain, vomiting, dysuria/hematuria or any additional concerns.  HPI     Past Medical History:  Diagnosis Date   Atrial fibrillation ([place])    Cystic breast    Depression    Diffuse cystic mastopathy    Dyslipidemia (high LDL; low HDL)    Hypertension    Iron deficiency anemia, unspecified    Lymphadenopathy of head and neck 04/05/2015   Medical non-compliance    Mitral regurgitation 02/2017   moderate to severe   Persistent atrial fibrillation with rapid ventricular response ([place]) 07/15/2017    Patient Active Problem List   Diagnosis Date Noted   Pneumonia due  to COVID-19 virus 05/07/2020   Physical debility 10/27/2019   Acute esophagitis    Gastritis without bleeding    Duodenal nodule    Dehydration 10/13/2019   Dysphagia 10/03/2019   Close exposure to COVID-19 virus 01/28/2019   Drug-induced bradycardia 12/10/2018   Numbness and tingling in left hand 07/07/2018   Pancytopenia, acquired ([place]) 06/05/2018   Drug-induced neutropenia ([place]) 11/25/2017   PICC (peripherally inserted central catheter) flush 10/30/2017   PICC (peripherally inserted central catheter) in place 10/30/2017   Closed wedge compression fracture of first lumbar vertebra ([place]) 10/25/2017   Acute midline low back pain with sciatica 10/22/2017   Back injury, initial encounter 10/22/2017   Acute bilateral thoracic back pain 10/22/2017   Port-A-Cath in place 40/98/1191   Chronic systolic heart failure ([place]) 06/27/2017   Goals of care, counseling/discussion 05/29/2017   Nonischemic cardiomyopathy ([place]) 04/07/2017   Chronic anticoagulation 03/23/2017   Mitral regurgitation 02/28/2017   Obesity 08/12/2016   Hypertriglyceridemia without hypercholesterolemia 07/12/2016   Screening for cervical cancer 06/11/2016   PAF (paroxysmal atrial fibrillation) ([place]) 01/01/2016   Hypokalemia 02/28/2014   Abnormal mammogram of right breast 02/28/2014   Dyslipidemia (high LDL; low HDL) 07/22/2012   Osteopenia 06/21/2012   Routine general medical examination at a health care facility 06/21/2012   Essential hypertension 06/21/2012   CLL (chronic lymphocytic leukemia) ([place]) 01/05/2012   Iron deficiency anemia 01/05/2012   Depression 01/05/2012   Benign carcinoid tumors of the appendix, large intestine, 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    Past Surgical History:  Procedure Laterality Date   BIOPSY  10/17/2019  Procedure: BIOPSY;  Surgeon: Jerene Bears, MD;  Location: [facility] ENDOS[facility];  Service: Endoscopy;;   CARDIOVERSION N/A 04/02/2017   Procedure: CARDIOVERSION;   Surgeon: Dorothy Spark, MD;  Location: [facility] ENDOSCOPY;  Service: Cardiovascular;  Laterality: N/A;   CARDIOVERSION N/A 08/19/2017   Procedure: CARDIOVERSION;  Surgeon: Larey Dresser, MD;  Location[facility] ENDOSCOPY;  Service: Cardiovascular;  Laterality: N/A;   ESOPHAGOGASTRODUODENOSCOPY (EGD) WITH PROPOFOL N/A 10/17/2019   Procedure: ESOPHAGOGASTRODUODENOSCOPY (EGD) WITH PROPOFOL;  Surgeon: Jerene Bears, MD;  Location: [facility] ENDOSCOPY;  Service: Endoscopy;  Laterality: N/A;   IR IMAGING GUIDED PORT INSERTION  06/29/2018   TUBAL LIGATION       OB History    Gravida  6   Para  6   Term  6   Preterm      AB      Living  5     SAB      TAB      Ectopic      Multiple      Live Births  5           Family History  Problem Relation Age of Onset   Sudden death Mother    Kidney disease Father        H/O HD and kidney transplant   Stomach cancer Maternal Aunt    Stomach cancer Paternal Grandmother    Heart disease Brother    Heart disease Maternal Grandmother     Social History   Tobacco Use   Smoking status: Never Smoker   Smokeless tobacco: Never Used  Vaping Use   Vaping Use: Never used  Substance Use Topics   Alcohol use: No   Drug use: No    Home Medications Prior to Admission medications   Medication Sig Start Date End Date Taking? Authorizing Provider  Cholecalciferol (VITAMIN D-3) 1000 units CAPS Take 1,000 Units by mouth daily with breakfast.    [provider]  ELIQUIS 5 MG TABS tablet TAKE 1 TABLET BY MOUTH TWICE A DAY 04/30/20   Camnitz, Ocie Doyne, MD  lenalidomide (REVLIMID) 10 MG capsule Take 1 capsule (10 mg total) by mouth daily. 04/30/20   Heath Lark, MD  lidocaine-prilocaine (EMLA) cream Apply 1 application topically as needed. Patient taking differently: Apply 1 application topically as needed (acess port).  06/04/18   Heath Lark, MD  metoprolol succinate (TOPROL-XL) 100 MG 24 hr tablet TAKE 1 AND 1/2 TABLETS BY  MOUTH EVERY DAY. TAKE WITH FOOD OR RIGHT AFTER A MEAL 08/05/19   Camnitz, Ocie Doyne, MD  ondansetron (ZOFRAN-ODT) 8 MG disintegrating tablet Take 1 tablet (8 mg total) by mouth every 8 (eight) hours as needed for nausea or vomiting. 10/07/19   Jaynee Eagles, PA-C  pantoprazole (PROTONIX) 40 MG tablet TAKE 1 TABLET BY MOUTH TWICE A DAY 01/12/20   Heath Lark, MD  sacubitril-valsartan (ENTRESTO) 49-51 MG Take 1 tablet by mouth 2 (two) times daily. 03/26/20   Camnitz, Ocie Doyne, MD  sulfamethoxazole-trimethoprim (BACTRIM) 400-80 MG tablet TAKE 1 TABLET BY MOUTH DAILY. 03/28/20   Heath Lark, MD  valGANciclovir (VALCYTE) 450 MG tablet TAKE 2 TABLETS (900 MG TOTAL) BY MOUTH DAILY. 03/28/20   Heath Lark, MD    Allergies    Patient has no known allergies.  Review of Systems   Review of Systems Ten systems are reviewed and are negative for acute change except as noted in the HPI  Physical Exam Updated Vital Signs BP (!) 153/80  Pulse 53    Temp (!) 101.3 F (38.5 C) (Oral)    Resp (!) 30    Ht 5\' 7"  (1.702 m)    Wt 88.2 kg    LMP 02/03/2017 (Approximate)    SpO2 93%    BMI 30.45 kg/m   Physical Exam Constitutional:      General: She is not in acute distress.    Appearance: Normal appearance. She is well-developed. She is not ill-appearing or diaphoretic.  HENT:     Head: Normocephalic and atraumatic.     Mouth/Throat:     Mouth: Mucous membranes are moist.     Pharynx: Oropharynx is clear.  Eyes:     General: Vision grossly intact. Gaze aligned appropriately.     Pupils: Pupils are equal, round, and reactive to light.  Neck:     Trachea: Trachea and phonation normal.  Cardiovascular:     Rate and Rhythm: Normal rate and regular rhythm.     Pulses: Normal pulses.     Heart sounds: Normal heart sounds.  Pulmonary:     Effort: Pulmonary effort is normal. No respiratory distress.     Breath sounds: Wheezing present.  Abdominal:     General: There is no distension.     Palpations: Abdomen  is soft.     Tenderness: There is no abdominal tenderness. There is no guarding or rebound.  Musculoskeletal:        General: Normal range of motion.     Cervical back: Normal range of motion.  Skin:    General: Skin is warm and dry.  Neurological:     Mental Status: She is alert.     GCS: GCS eye subscore is 4. GCS verbal subscore is 5. GCS motor subscore is 6.     Comments: Speech is clear and goal oriented, follows commands Major Cranial nerves without deficit, no facial droop Moves extremities without ataxia, coordination intact  Psychiatric:        Behavior: Behavior normal.     ED Results / Procedures / Treatments   Labs (all labs ordered are listed, but only abnormal results are displayed) Labs Reviewed  COMPREHENSIVE METABOLIC PANEL - Abnormal; Notable for the following components:      Result Value   Potassium 2.7 (*)    CO2 21 (*)    Glucose, Bld 119 (*)    Calcium 8.7 (*)    Total Bilirubin 2.2 (*)    All other components within normal limits  PROTIME-INR - Abnormal; Notable for the following components:   Prothrombin Time 19.5 (*)    INR 1.7 (*)    All other components within normal limits  APTT - Abnormal; Notable for the following components:   aPTT 50 (*)    All other components within normal limits  D-DIMER, QUANTITATIVE (NOT AT [facility]) - Abnormal; Notable for the following components:   D-Dimer, Quant 1.36 (*)    All other components within normal limits  LACTATE DEHYDROGENASE - Abnormal; Notable for the following components:   LDH 593 (*)    All other components within normal limits  FIBRINOGEN - Abnormal; Notable for the following components:   Fibrinogen 726 (*)    All other components within normal limits  POC SARS CORONAVIRUS 2 AG -  ED - Abnormal; Notable for the following components:   SARS Coronavirus 2 Ag POSITIVE (*)    All other components within normal limits  CULTURE, BLOOD (ROUTINE X 2)  CULTURE, BLOOD (ROUTINE X 2)  URINE CULTURE    LIPASE, BLOOD  CBC  LACTIC ACID, PLASMA  TRIGLYCERIDES  URINALYSIS, ROUTINE W REFLEX MICROSCOPIC  LACTIC ACID, PLASMA  PROCALCITONIN  FERRITIN  C-REACTIVE PROTEIN  I-STAT BETA HCG BLOOD, ED ([facility], [facility], [facility] ONLY)    EKG EKG Interpretation  Date/Time:  Monday May 07 2020 09:25:55 EDT Ventricular Rate:  87 PR Interval:    QRS Duration: 100 QT Interval:  351 QTC Calculation: 423 R Axis:   -18 Text Interpretation: Sinus rhythm RAE, consider biatrial enlargement Abnormal R-wave progression, early transition LVH with secondary repolarization abnormality Baseline wander in lead(s) V5 No significant change since last tracing Confirmed by Calvert Cantor [phone]) on 05/07/2020 9:30:33 AM   Radiology DG Chest Portable 1 View  Result Date: 05/07/2020 CLINICAL DATA:  Fever EXAM: PORTABLE CHEST 1 VIEW COMPARISON:  10/02/2017 FINDINGS: Right Port-A-Cath is in place with the tip at the cavoatrial junction. Mild cardiomegaly. Left lower lobe airspace opacity. Right lung clear. No effusions or acute bony abnormality. IMPRESSION: Left lower lobe opacity concerning for pneumonia. Cardiomegaly. Electronically Signed   By: Rolm Baptise M.D.   On: 05/07/2020 09:20    Procedures Procedures (including critical care time)  Medications Ordered in ED Medications  sodium chloride flush (NS) 0.9 % injection 3 mL (has no administration in time range)  lactated ringers infusion ( Intravenous New Bag/Given 05/07/20 0941)  vancomycin (VANCOREADY) IVPB 1750 mg/350 mL (1,750 mg Intravenous New Bag/Given 05/07/20 1025)  potassium chloride 10 mEq in 100 mL IVPB (has no administration in time range)  dexamethasone (DECADRON) injection 10 mg (has no administration in time range)  acetaminophen (TYLENOL) tablet 650 mg (650 mg Oral Given 05/07/20 0828)  ceFEPIme (MAXIPIME) 2 g in sodium chloride 0.9 % 100 mL IVPB (0 g Intravenous Stopped 05/07/20 1025)  albuterol (VENTOLIN HFA) 108 (90 Base) MCG/ACT inhaler 4 puff (4 puffs  Inhalation Given 05/07/20 1028)  aerochamber Z-Stat Plus/medium 1 each (1 each Other Given 05/07/20 1033)    ED Course  I have reviewed the triage vital signs and the nursing notes.  Pertinent labs & imaging results that were available during my care of the patient were reviewed by me and considered in my medical decision making (see chart for details).  Clinical Course as of May 08 1107  Mon May 07, 2020  0857 Patient not in room   [BM]    Clinical Course User Index [BM] Gari Crown   MDM Rules/Calculators/A&P                         Additional history obtained from: 1. Nursing notes from this visit. 2. Review of electronic medical records. ------------------- 59 year old female history as above presents today for 3-day history of symptoms concerning for COVID-19 viral infection.  She did receive her second dose of the Pfizer vaccine around 2 weeks ago but given immunocompromised status on chemotherapy and known positive contact, her daughter, concern for COVID-19 viral infection is high on initial evaluation.  She is febrile tachycardic and tachypneic on additional valuation as well code sepsis was initiated with broad-spectrum antibiotics cefepime and vancomycin ordered.  However given patient not hypotensive and suspicion for COVID-19 viral infection again high the 3 L of fluid bolus was withheld initially.  Lactated Ringer's infusion was ordered though.  Discussed with Dr. Karle Starch who agrees.  Patient with minimal wheezing bilaterally on exam albuterol ordered for suspected reactive airway. --------------- I ordered, reviewed and interpreted labs which  include: Covid test positive. CBC within normal limits no leukocytosis or anemia. Lipase within normal limits. Pregnancy test negative. CMP shows hypokalemia at 2.7, otherwise CMP unremarkable.  CXR:  IMPRESSION:  Left lower lobe opacity concerning for pneumonia.    Cardiomegaly.   EKG: Sinus rhythm RAE, consider  biatrial enlargement Abnormal R-wave progression, early transition LVH with secondary repolarization abnormality Baseline wander in lead(s) V5 No significant change since last tracing Confirmed by Calvert Cantor [id number]) on 05/07/2020 9:30:33 AM - Following positive Covid test the inflammatory markers were ordered.  Patient was reevaluated resting comfortably no acute distress minimally tachycardic at 101 bpm, no hypoxia on room air.  Attempted ambulation on pulse oximetry however patient reports to fatigued at this time.  Plan of care is consult to hospitalist service for admission.  Potassium repletion begun in the ER. Discussed case with Dr. Karle Starch who agrees with admission. - Discussed case with Dr. Antonieta Pert, patient accepted for hospitalist admission, remdesivir and decadron ordered.   Audrey Gross was evaluated in Emergency Department on 05/07/2020 for the symptoms described in the history of present illness. She was evaluated in the context of the global COVID-19 pandemic, which necessitated consideration that the patient might be at risk for infection with the SARS-CoV-2 virus that causes COVID-19. Institutional protocols and algorithms that pertain to the evaluation of patients at risk for COVID-19 are in a state of rapid change based on information released by regulatory bodies including the CDC and federal and state organizations. These policies and algorithms were followed during the patient's care in the ED.  Note: Portions of this report may have been transcribed using voice recognition software. Every effort was made to ensure accuracy; however, inadvertent computerized transcription errors may still be present. Final Clinical Impression(s) / ED Diagnoses Final diagnoses:  [id number] virus infection  Hypokalemia    Rx / DC Orders ED Discharge Orders    None       Gari Crown 05/07/20 1118    Truddie Hidden, MD 05/07/20 1423

## 2020-05-08 ENCOUNTER — Other Ambulatory Visit: Payer: Self-pay

## 2020-05-08 DIAGNOSIS — F329 Major depressive disorder, single episode, unspecified: Secondary | ICD-10-CM

## 2020-05-08 DIAGNOSIS — A0839 Other viral enteritis: Secondary | ICD-10-CM

## 2020-05-08 DIAGNOSIS — I428 Other cardiomyopathies: Secondary | ICD-10-CM

## 2020-05-08 DIAGNOSIS — I10 Essential (primary) hypertension: Secondary | ICD-10-CM

## 2020-05-08 DIAGNOSIS — E785 Hyperlipidemia, unspecified: Secondary | ICD-10-CM

## 2020-05-08 DIAGNOSIS — I480 Paroxysmal atrial fibrillation: Secondary | ICD-10-CM

## 2020-05-08 DIAGNOSIS — E876 Hypokalemia: Secondary | ICD-10-CM

## 2020-05-08 LAB — COMPREHENSIVE METABOLIC PANEL
ALT: 22 U/L (ref 0–44)
AST: 27 U/L (ref 15–41)
Albumin: 3.5 g/dL (ref 3.5–5.0)
Alkaline Phosphatase: 35 U/L — ABNORMAL LOW (ref 38–126)
Anion gap: 10 (ref 5–15)
BUN: 17 mg/dL (ref 6–20)
CO2: 21 mmol/L — ABNORMAL LOW (ref 22–32)
Calcium: 9.1 mg/dL (ref 8.9–10.3)
Chloride: 109 mmol/L (ref 98–111)
Creatinine, Ser: 0.7 mg/dL (ref 0.44–1.00)
GFR calc Af Amer: >60 mL/min (ref >60)
GFR calc non Af Amer: >60 mL/min (ref >60)
Glucose, Bld: 189 mg/dL — ABNORMAL HIGH (ref 70–99)
Potassium: 3.4 mmol/L — ABNORMAL LOW (ref 3.5–5.1)
Sodium: 140 mmol/L (ref 135–145)
Total Bilirubin: 0.9 mg/dL (ref 0.3–1.2)
Total Protein: 6.3 g/dL — ABNORMAL LOW (ref 6.5–8.1)

## 2020-05-08 LAB — CBC WITH DIFFERENTIAL/PLATELET
Abs Immature Granulocytes: 0.1 10*3/uL — ABNORMAL HIGH (ref 0.00–0.07)
Basophils Absolute: 0 10*3/uL (ref 0.0–0.1)
Basophils Relative: 0 %
Eosinophils Absolute: 0 10*3/uL (ref 0.0–0.5)
Eosinophils Relative: 0 %
HCT: 34.7 % — ABNORMAL LOW (ref 36.0–46.0)
Hemoglobin: 11.5 g/dL — ABNORMAL LOW (ref 12.0–15.0)
Lymphocytes Relative: 75 %
Lymphs Abs: 3 10*3/uL (ref 0.7–4.0)
MCH: 29 pg (ref 26.0–34.0)
MCHC: 33.1 g/dL (ref 30.0–36.0)
MCV: 87.4 fL (ref 80.0–100.0)
Monocytes Absolute: 0.1 10*3/uL (ref 0.1–1.0)
Monocytes Relative: 3 %
Myelocytes: 2 %
Neutro Abs: 0.8 10*3/uL — ABNORMAL LOW (ref 1.7–7.7)
Neutrophils Relative %: 20 %
Platelets: 146 10*3/uL — ABNORMAL LOW (ref 150–400)
RBC: 3.97 MIL/uL (ref 3.87–5.11)
RDW: 13.7 % (ref 11.5–15.5)
WBC: 4 10*3/uL (ref 4.0–10.5)
nRBC: 0 % (ref 0.0–0.2)

## 2020-05-08 LAB — C-REACTIVE PROTEIN: CRP: 27.1 mg/dL — ABNORMAL HIGH (ref <1.0)

## 2020-05-08 LAB — D-DIMER, QUANTITATIVE: D-Dimer, Quant: 0.89 ug/mL-FEU — ABNORMAL HIGH (ref 0.00–0.50)

## 2020-05-08 LAB — URINE CULTURE: Culture: NO GROWTH

## 2020-05-08 LAB — PROCALCITONIN: Procalcitonin: 3.26 ng/mL

## 2020-05-08 LAB — FERRITIN: Ferritin: 2778 ng/mL — ABNORMAL HIGH (ref 11–307)

## 2020-05-08 MED ORDER — CHLORHEXIDINE GLUCONATE CLOTH 2 % EX PADS
6.0000 | MEDICATED_PAD | Freq: Every day | CUTANEOUS | Status: DC
  Administered 2020-05-08 – 2020-05-11 (×4): 6 via TOPICAL

## 2020-05-08 MED ORDER — IPRATROPIUM-ALBUTEROL 20-100 MCG/ACT IN AERS
1.0000 | INHALATION_SPRAY | Freq: Four times a day (QID) | RESPIRATORY_TRACT | Status: DC
  Administered 2020-05-08 – 2020-05-12 (×14): 1 via RESPIRATORY_TRACT
  Filled 2020-05-08: qty 4

## 2020-05-08 MED ORDER — GUAIFENESIN-DM 100-10 MG/5ML PO SYRP
5.0000 mL | ORAL_SOLUTION | ORAL | Status: DC | PRN
  Administered 2020-05-09 – 2020-05-11 (×5): 5 mL via ORAL
  Filled 2020-05-08 (×5): qty 10

## 2020-05-08 MED ORDER — ALBUTEROL SULFATE HFA 108 (90 BASE) MCG/ACT IN AERS
2.0000 | INHALATION_SPRAY | RESPIRATORY_TRACT | Status: DC | PRN
  Administered 2020-05-08: 2 via RESPIRATORY_TRACT

## 2020-05-08 MED ORDER — POTASSIUM CHLORIDE CRYS ER 20 MEQ PO TBCR
40.0000 meq | EXTENDED_RELEASE_TABLET | Freq: Two times a day (BID) | ORAL | Status: AC
  Administered 2020-05-08 (×2): 40 meq via ORAL
  Filled 2020-05-08 (×2): qty 2

## 2020-05-08 NOTE — Progress Notes (Signed)
PROGRESS NOTE    Audrey Gross  KGY:[id number] DOB: 1961/05/27 DOA: 05/07/2020 PCP: Janith Lima, MD  Brief Narrative:  HPI per Dr. Antonieta Pert on 05/07/20 Audrey Gross is a 59 y.o. 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 fib on Eliquis, dyslipidemia, hypertension, obesity, history of NICM , MR comes to the ED for admission of nausea vomiting cough for 3 days patient reports symptoms started and has been gradually worsening.  She complains of feeling warm but did not record temperature at home, complains of nonbloody diarrhea, body aches fatigue cough and also shortness of breath.  Covid vaccine in July 17 second dose, reports her daughter is unvaccinated and returned home from [place] recently and tested positive for COVID-19.  ED Course: Febrile up to 101.3, respiration 22-36, saturating up to 93 to 95% on room air, blood pressure stable heart rate in 50s to 105.  Routine blood work showed hypokalemia 2.7, bicarb 21, bilirubin 2.2, CBC stable, LDH 593 lactic acid 1.2, procalcitonin D-dimer 1.3, procalcitonin and and CRP pending. Initially patient received vancomycin and cefepime, later on Covid Ag came back positive and remdesivir has been ordered.  Admission has been requested for further management. CXR shows 'Left lower lobe opacity concerning for pneumonia" Currently of appears comfortable on room air.  Not in acute distress but appears anxious.  **Interim History  Inflammatory Markers are elevated and now slightly trending down. Will continue Remdesivir. Will monitor for Hypoxia.   Assessment & Plan:   Principal Problem:   Pneumonia due to COVID-19 virus Active Problems:   Depression   Essential hypertension   Dyslipidemia (high LDL; low HDL)   Hypokalemia   PAF (paroxysmal atrial fibrillation) (HCC)   Nonischemic cardiomyopathy (HCC)  Viral Sepsis, poA COVID-19 Pneumonia  -SARS-CoV-2 PCR positive 04/29/20 and was reportedly diagnosed with Mononucleosis  on 04/24/20 -Patient met criteria for sepsis on admission given SIRS criteria of a respiratory rate > 20, HR >90 & Fever >100.4 and a Pneumonia on Admission -Initial CXR showed "Right Port-A-Cath is in place with the tip at the cavoatrial junction. Mild cardiomegaly. Left lower lobe airspace opacity. Right lung clear. No effusions or acute bony abnormality.." -Sepsis Physiology is improved -Inflammatory Marker Trend: Recent Labs    05/07/20 0926 05/07/20 1026 05/08/20 0504  DDIMER 1.36*  --  0.89*  FERRITIN  --  2,519* 2,778*  LDH 593*  --   --   CRP  --  31.4* 27.1*  -TG was 132  Lab Results  Component Value Date   SARSCOV2NAA NEGATIVE 10/13/2019   SARSCOV2NAA NOT DETECTED 10/07/2019  -Fibrinogen was 726 -ESR was 23 has now normalized to 15 -PCT was 4.49 and now 3.26 with concern for superimposed bacterial infection will start antibiotics and is on IV Cefepime and IV Vancomycin; U/A is unremarkable and Urine Cx pending  -Check MRSA PCR -SpO2: 98 %; Currently not requiring any Supplemental O2 via [place] -Continue Remdesivir x5 days 05/07/20 -> -Steroids x10 days; Currently getting IV Decadron 6 mg Daily; Received IV Decadron 6 mg x1 on admission and will change to po in the AM  -CRP was grossly elevated at 31.4 -> 27.1 -Continue Airborne, contact precautions for 21 days from positive testing (since patient is admitted for COVID) -Contine Monitor inflammatory markers and LFTs.  -Encourage OOB, IS, FV -C/w Acetaminophen for Fever  -Enoxaparin 92m q24h -Blood Cx x2 Showed NGTD at 1 Day -Added Combivent 2 puff IH q6h and Albuterol 1 puff IH  q4hprn -Continue to monitor temperature curve and continue with acetaminophen for mild fever -Antitussives with p.o. Robitussin-DM and Tussionex; Will add Benzonatate 100 mg po TIDprn as well  -Continue with zinc sulfate 220 mg p.o. daily along with vitamin C 500 mg p.o. daily -Prone if able  Hypokalemia  -Patient's K+ was 2.7 and improved to  3.4 -Continue to Replete and will give po 40 mEQ BID x2 -In the setting of Diarrhea from COVID -Continue to Monitor and Replete as Necessary; Check Mag Level in the AM -Repeat CMP in the AM   CLL  -Recently started on chemo Revlimid low-dose 10 mg, follows Dr Alvy Bimler.  -Notiied Dr Alvy Bimler and advised to hold chemo for now.  -C/w Bactrim 400-80 1 tab po Daily and Valganciclovir 900 mg po Daily  -Need op follow up in future to resume  Essential Hypertension -Blood pressure stable and last reading was 129/93.   -On Metoprolol XL for A. Fib. -C/w Sacubitril-Valsartan 49-51 mg po 1 tab BID  NICM/MR -On metoprolol 150 mg, and Entresto at home.   -Continue home meds with holding parameters. -Will Consider repeating ECHO and Consulting Cards  Metabolic Acidosis -Mild  -Patient's CO2 is 21, AG is 10, Chloride is 109 -Continue to Monitor and Trend -Repeat CMP in the AM   Normocytic Anemia -Hgb/Hct went from 12.4/37.7 -> 11.5/34.7 -Check Anemia Panel in the AM -Continue to Monitor for S/Sx; Currently no overt bleeding noted -Repeat CBC   Thrombocytopenia -Patient's Platelet Count went from 154 -> 146 -In the see -Continue to Monitor for S/Sx of Bleeding; No overt bleeding noted  -Repeat CBC in the AM  PAF with RVR -Patient on Apixaban 5 mg po BID and Metoprolol XL 150 mg po Daily with Breakfast -She went into rapid atrial fibrillation with heart rate in 150s EKG reviewed.   -Ordered Cardizem bolus 15 mg x 1 and infusion with conversion to NSR yesterday but went back into A Fib with RVR -She was able to take her oral metoprolol earlier- hopefully we can stop Cardizem drip soon but has intermittent tachycardia and now on 15 mg/hr on Cardizem gtt -May need Cardiology consultation if not improving   Obesity -Estimated body mass index is 30.45 kg/m as calculated from the following:   Height as of this encounter: '5\' 7"'  (1.702 m).   Weight as of this encounter: 88.2 kg. -Weight  Loss and Dietary Counseling given   DVT prophylaxis: Anticoagulated with Eliquis  Code Status: FULL CODE  Family Communication: No family present at bedside  Disposition Plan: Pending further clinical improvement back to baseline improvement in her symptoms  Status is: Inpatient  Remains inpatient appropriate because:Persistent severe electrolyte disturbances, Ongoing diagnostic testing needed not appropriate for outpatient work up, Unsafe d/c plan, IV treatments appropriate due to intensity of illness or inability to take PO and Inpatient level of care appropriate due to severity of illness   Dispo: The patient is from: Home              Anticipated d/c is to: TBD              Anticipated d/c date is: 3 days              Patient currently is not medically stable to d/c.  Consultants:   None   Procedures: None  Antimicrobials:  Anti-infectives (From admission, onward)   Start     Dose/Rate Route Frequency Ordered Stop   05/08/20 1000  remdesivir 100 mg  in sodium chloride 0.9 % 100 mL IVPB     Discontinue    "Followed by" Linked Group Details   100 mg 200 mL/hr over 30 Minutes Intravenous Daily 05/07/20 1123 05/12/20 0959   05/07/20 2230  vancomycin (VANCOCIN) IVPB 1000 mg/200 mL premix     Discontinue     1,000 mg 200 mL/hr over 60 Minutes Intravenous Every 12 hours 05/07/20 1306     05/07/20 1800  ceFEPIme (MAXIPIME) 2 g in sodium chloride 0.9 % 100 mL IVPB     Discontinue     2 g 200 mL/hr over 30 Minutes Intravenous Every 8 hours 05/07/20 1306     05/07/20 1400  sulfamethoxazole-trimethoprim (BACTRIM) 400-80 MG per tablet 1 tablet     Discontinue     1 tablet Oral Daily 05/07/20 1257     05/07/20 1400  valGANciclovir (VALCYTE) 450 MG tablet TABS 900 mg     Discontinue     900 mg Oral Daily 05/07/20 1257     05/07/20 1200  remdesivir 100 mg in sodium chloride 0.9 % 100 mL IVPB       "Followed by" Linked Group Details   100 mg 200 mL/hr over 30 Minutes Intravenous Every  30 min 05/07/20 1123 05/07/20 1420   05/07/20 0945  vancomycin (VANCOREADY) IVPB 1750 mg/350 mL        1,750 mg 175 mL/hr over 120 Minutes Intravenous  Once 05/07/20 0926 05/07/20 1245   05/07/20 0915  vancomycin (VANCOCIN) IVPB 1000 mg/200 mL premix  Status:  Discontinued        1,000 mg 200 mL/hr over 60 Minutes Intravenous  Once 05/07/20 0910 05/07/20 0926   05/07/20 0915  ceFEPIme (MAXIPIME) 2 g in sodium chloride 0.9 % 100 mL IVPB        2 g 200 mL/hr over 30 Minutes Intravenous  Once 05/07/20 0910 05/07/20 1025     Subjective: Seen and examined at bedside still not feeling well and states that she has some shortness of breath but was not actually hypoxic.  Continues to have some diarrhea.  No nausea or vomiting.  Denies any lightheadedness or dizziness.  Continues to not feel well though.  No other concerns or complaints this time.  Objective: Vitals:   05/08/20 0400 05/08/20 0500 05/08/20 0600 05/08/20 0646  BP: 138/85 139/82 124/83 127/90  Pulse: 70 71 69 76  Resp: (!) 29 (!) 27 (!) 26 17  Temp:    98 F (36.7 C)  TempSrc:    Oral  SpO2: 99% 95% 95% 96%  Weight:      Height:        Intake/Output Summary (Last 24 hours) at 05/08/2020 0831 Last data filed at 05/08/2020 0960 Gross per 24 hour  Intake 2714.86 ml  Output --  Net 2714.86 ml   Filed Weights   05/07/20 0813  Weight: 88.2 kg   Examination: Physical Exam:  Constitutional: WN/WD obese African-American female currently in, NAD and appears calm and comfortable Eyes: PERRL, lids and conjunctivae normal, sclerae anicteric  ENMT: External Ears, Nose appear normal. Grossly normal hearing.  Neck: Appears normal, supple, no cervical masses, normal ROM, no appreciable thyromegaly; no JVD Respiratory: Diminished to auscultation bilaterally with coarse breath sounds and some crackles.  No appreciable wheezing, rales, rhonchi.  Has a normal respiratory effort and is not wearing supplemental oxygen nasal  cannula Cardiovascular: Regularly irregular slightly tachycardic, no murmurs / rubs / gallops. S1 and S2 auscultated.  Trace extremity edema  Abdomen: Soft, non-tender, distended secondary by habitus. Bowel sounds positive.  GU: Deferred. Musculoskeletal: No clubbing / cyanosis of digits/nails. No joint deformity upper and lower extremities.  Skin: No rashes, lesions, ulcers on limited skin evaluation. No induration; Warm and dry.  Neurologic: CN 2-12 grossly intact with no focal deficits. Romberg sign and cerebellar reflexes not assessed.  Psychiatric: Normal judgment and insight. Alert and oriented x 3. Normal mood and appropriate affect.   Data Reviewed: I have personally reviewed following labs and imaging studies  CBC: Recent Labs  Lab 05/07/20 0927 05/08/20 0504  WBC 4.5 4.0  NEUTROABS  --  0.8*  HGB 12.4 11.5*  HCT 37.7 34.7*  MCV 87.9 87.4  PLT 154 676*   Basic Metabolic Panel: Recent Labs  Lab 05/07/20 0927 05/08/20 0504  NA 138 140  K 2.7* 3.4*  CL 102 109  CO2 21* 21*  GLUCOSE 119* 189*  BUN 16 17  CREATININE 0.92 0.70  CALCIUM 8.7* 9.1   GFR: Estimated Creatinine Clearance: 86.3 mL/min (by C-G formula based on SCr of 0.7 mg/dL). Liver Function Tests: Recent Labs  Lab 05/07/20 0927 05/08/20 0504  AST 36 27  ALT 20 22  ALKPHOS 41 35*  BILITOT 2.2* 0.9  PROT 7.3 6.3*  ALBUMIN 4.2 3.5   Recent Labs  Lab 05/07/20 0927  LIPASE 23   No results for input(s): AMMONIA in the last 168 hours. Coagulation Profile: Recent Labs  Lab 05/07/20 0926  INR 1.7*   Cardiac Enzymes: No results for input(s): CKTOTAL, CKMB, CKMBINDEX, TROPONINI in the last 168 hours. BNP (last 3 results) No results for input(s): PROBNP in the last 8760 hours. HbA1C: No results for input(s): HGBA1C in the last 72 hours. CBG: No results for input(s): GLUCAP in the last 168 hours. Lipid Profile: Recent Labs    05/07/20 0926  TRIG 132   Thyroid Function Tests: No results  for input(s): TSH, T4TOTAL, FREET4, T3FREE, THYROIDAB in the last 72 hours. Anemia Panel: Recent Labs    05/07/20 1026 05/08/20 0504  FERRITIN 2,519* 2,778*   Sepsis Labs: Recent Labs  Lab 05/07/20 0926 05/08/20 0504  PROCALCITON 4.49 3.26  LATICACIDVEN 1.2  --     No results found for this or any previous visit (from the past 240 hour(s)).   RN Pressure Injury Documentation:     Estimated body mass index is 30.45 kg/m as calculated from the following:   Height as of this encounter: '5\' 7"'  (1.702 m).   Weight as of this encounter: 88.2 kg.  Malnutrition Type:      Malnutrition Characteristics:      Nutrition Interventions:    Radiology Studies: DG Chest Portable 1 View  Result Date: 05/07/2020 CLINICAL DATA:  Fever EXAM: PORTABLE CHEST 1 VIEW COMPARISON:  10/02/2017 FINDINGS: Right Port-A-Cath is in place with the tip at the cavoatrial junction. Mild cardiomegaly. Left lower lobe airspace opacity. Right lung clear. No effusions or acute bony abnormality. IMPRESSION: Left lower lobe opacity concerning for pneumonia. Cardiomegaly. Electronically Signed   By: Rolm Baptise M.D.   On: 05/07/2020 09:20   Scheduled Meds:  albuterol  2 puff Inhalation Q6H   apixaban  5 mg Oral BID   vitamin C  500 mg Oral Daily   cholecalciferol  1,000 Units Oral Q breakfast   dexamethasone (DECADRON) injection  6 mg Intravenous Q24H   metoprolol succinate  150 mg Oral Q breakfast   pantoprazole  40 mg Oral BID   sacubitril-valsartan  1 tablet Oral BID   sulfamethoxazole-trimethoprim  1 tablet Oral Daily   valGANciclovir  900 mg Oral Daily   zinc sulfate  220 mg Oral Daily   Continuous Infusions:  ceFEPime (MAXIPIME) IV Stopped (05/08/20 0537)   diltiazem (CARDIZEM) infusion Stopped (05/08/20 0445)   remdesivir 100 mg in NS 100 mL     vancomycin Stopped (05/07/20 2325)    LOS: 1 day   Kerney Elbe, DO [facility] PAGER is on AMION  If 7PM-7AM,  please contact night-coverage [email or web address]

## 2020-05-09 ENCOUNTER — Ambulatory Visit: Payer: BC Managed Care – PPO | Admitting: Hematology and Oncology

## 2020-05-09 ENCOUNTER — Other Ambulatory Visit: Payer: BC Managed Care – PPO

## 2020-05-09 LAB — CBC WITH DIFFERENTIAL/PLATELET
Abs Immature Granulocytes: 0.04 10*3/uL (ref 0.00–0.07)
Basophils Absolute: 0.1 10*3/uL (ref 0.0–0.1)
Basophils Relative: 1 %
Eosinophils Absolute: 0.1 10*3/uL (ref 0.0–0.5)
Eosinophils Relative: 2 %
HCT: 40.3 % (ref 36.0–46.0)
Hemoglobin: 13.3 g/dL (ref 12.0–15.0)
Immature Granulocytes: 1 %
Lymphocytes Relative: 29 %
Lymphs Abs: 2.4 10*3/uL (ref 0.7–4.0)
MCH: 28.5 pg (ref 26.0–34.0)
MCHC: 33 g/dL (ref 30.0–36.0)
MCV: 86.5 fL (ref 80.0–100.0)
Monocytes Absolute: 5 10*3/uL — ABNORMAL HIGH (ref 0.1–1.0)
Monocytes Relative: 58 %
Neutro Abs: 0.7 10*3/uL — ABNORMAL LOW (ref 1.7–7.7)
Neutrophils Relative %: 9 %
Platelets: 212 10*3/uL (ref 150–400)
RBC: 4.66 MIL/uL (ref 3.87–5.11)
RDW: 14.4 % (ref 11.5–15.5)
WBC: 8.3 10*3/uL (ref 4.0–10.5)
nRBC: 0 % (ref 0.0–0.2)

## 2020-05-09 LAB — COMPREHENSIVE METABOLIC PANEL
ALT: 25 U/L (ref 0–44)
AST: 29 U/L (ref 15–41)
Albumin: 4 g/dL (ref 3.5–5.0)
Alkaline Phosphatase: 40 U/L (ref 38–126)
Anion gap: 12 (ref 5–15)
BUN: 17 mg/dL (ref 6–20)
CO2: 21 mmol/L — ABNORMAL LOW (ref 22–32)
Calcium: 9 mg/dL (ref 8.9–10.3)
Chloride: 108 mmol/L (ref 98–111)
Creatinine, Ser: 0.87 mg/dL (ref 0.44–1.00)
GFR calc Af Amer: >60 mL/min (ref >60)
GFR calc non Af Amer: >60 mL/min (ref >60)
Glucose, Bld: 101 mg/dL — ABNORMAL HIGH (ref 70–99)
Potassium: 2.9 mmol/L — ABNORMAL LOW (ref 3.5–5.1)
Sodium: 141 mmol/L (ref 135–145)
Total Bilirubin: 0.9 mg/dL (ref 0.3–1.2)
Total Protein: 7.1 g/dL (ref 6.5–8.1)

## 2020-05-09 LAB — MAGNESIUM: Magnesium: 1.8 mg/dL (ref 1.7–2.4)

## 2020-05-09 LAB — RETICULOCYTES
Immature Retic Fract: 6.1 % (ref 2.3–15.9)
RBC.: 4.62 MIL/uL (ref 3.87–5.11)
Retic Count, Absolute: 32.8 10*3/uL (ref 19.0–186.0)
Retic Ct Pct: 0.7 % (ref 0.4–3.1)

## 2020-05-09 LAB — FIBRINOGEN: Fibrinogen: 681 mg/dL — ABNORMAL HIGH (ref 210–475)

## 2020-05-09 LAB — SEDIMENTATION RATE: Sed Rate: 53 mm/hr — ABNORMAL HIGH (ref 0–22)

## 2020-05-09 LAB — IRON AND TIBC
Iron: 24 ug/dL — ABNORMAL LOW (ref 28–170)
Saturation Ratios: 9 % — ABNORMAL LOW (ref 10.4–31.8)
TIBC: 253 ug/dL (ref 250–450)
UIBC: 229 ug/dL

## 2020-05-09 LAB — PROCALCITONIN: Procalcitonin: 1.79 ng/mL

## 2020-05-09 LAB — C-REACTIVE PROTEIN: CRP: 16.5 mg/dL — ABNORMAL HIGH (ref <1.0)

## 2020-05-09 LAB — PHOSPHORUS: Phosphorus: 2.4 mg/dL — ABNORMAL LOW (ref 2.5–4.6)

## 2020-05-09 LAB — D-DIMER, QUANTITATIVE: D-Dimer, Quant: 1 ug/mL-FEU — ABNORMAL HIGH (ref 0.00–0.50)

## 2020-05-09 LAB — VITAMIN B12: Vitamin B-12: 3525 pg/mL — ABNORMAL HIGH (ref 180–914)

## 2020-05-09 LAB — LACTATE DEHYDROGENASE: LDH: 506 U/L — ABNORMAL HIGH (ref 98–192)

## 2020-05-09 LAB — FOLATE: Folate: 7.7 ng/mL (ref >5.9)

## 2020-05-09 LAB — FERRITIN: Ferritin: 3736 ng/mL — ABNORMAL HIGH (ref 11–307)

## 2020-05-09 MED ORDER — POTASSIUM CHLORIDE CRYS ER 20 MEQ PO TBCR
40.0000 meq | EXTENDED_RELEASE_TABLET | Freq: Once | ORAL | Status: AC
  Administered 2020-05-09: 40 meq via ORAL
  Filled 2020-05-09: qty 2

## 2020-05-09 MED ORDER — DILTIAZEM HCL ER 60 MG PO CP12
60.0000 mg | ORAL_CAPSULE | Freq: Two times a day (BID) | ORAL | Status: DC
  Administered 2020-05-09 – 2020-05-12 (×7): 60 mg via ORAL
  Filled 2020-05-09 (×7): qty 1

## 2020-05-09 MED ORDER — SODIUM CHLORIDE 0.9% FLUSH: 10.0000 mL | Freq: Two times a day (BID) | INTRAVENOUS | Status: DC

## 2020-05-09 MED ORDER — SODIUM CHLORIDE 0.9% FLUSH: 10.0000 mL | INTRAVENOUS | Status: DC | PRN

## 2020-05-09 MED ORDER — ALTEPLASE 2 MG IJ SOLR
2.0000 mg | Freq: Once | INTRAMUSCULAR | Status: AC
  Administered 2020-05-09: 2 mg
  Filled 2020-05-09: qty 2

## 2020-05-09 MED ORDER — METHYLPREDNISOLONE SODIUM SUCC 125 MG IJ SOLR
60.0000 mg | Freq: Two times a day (BID) | INTRAMUSCULAR | Status: DC
  Administered 2020-05-09 – 2020-05-12 (×6): 60 mg via INTRAVENOUS
  Filled 2020-05-09 (×7): qty 2

## 2020-05-09 MED ORDER — MAGNESIUM SULFATE 2 GM/50ML IV SOLN
2.0000 g | Freq: Once | INTRAVENOUS | Status: AC
  Administered 2020-05-09: 2 g via INTRAVENOUS
  Filled 2020-05-09: qty 50

## 2020-05-09 MED ORDER — STERILE WATER FOR INJECTION IJ SOLN
INTRAMUSCULAR | Status: AC
  Filled 2020-05-09: qty 10

## 2020-05-09 MED ORDER — HYDROCOD POLST-CPM POLST ER 10-8 MG/5ML PO SUER
5.0000 mL | Freq: Two times a day (BID) | ORAL | Status: DC | PRN
  Administered 2020-05-09 – 2020-05-11 (×3): 5 mL via ORAL
  Filled 2020-05-09 (×3): qty 5

## 2020-05-09 NOTE — Progress Notes (Signed)
PROGRESS NOTE  Audrey Gross  EFE:[id number] DOB: 08/16/1961 DOA: 05/07/2020 PCP: Audrey Lima, MD  Outpatient Specialists: Oncology, Dr. Alvy Gross Brief Narrative: Audrey Gross is a 59 y.o. 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 She lives with her daughter who recently returned from [place] with covid and SARS-CoV-2 antigen testing was confirmed to be positive. She was febrile to 101.53F, tachypneic with SpO2 nadir 93%, developed AFib with RVR. CRP noted to be grossly elevated at 31.4, and CXR demonstrated opacity at left base.   Vancomycin, cefepime initially given, then changed to remdesivir for covid-19 pneumonia. Diltiazem infusion started with improvement in heart rate.  Assessment & Plan: Principal Problem:   Pneumonia due to COVID-19 virus Active Problems:   Depression   Essential hypertension   Dyslipidemia (high LDL; low HDL)   Hypokalemia   PAF (paroxysmal atrial fibrillation) (HCC)   Nonischemic cardiomyopathy (HCC)  Sepsis and acute respiratory distress due to covid-19 and superimposed bacterial LLL pneumonia: SARS-CoV-2 Ag positive on 8/2. Note history of pulmonary leukostasis 2018 which is not suggested by her current clinical scenario. - Clinical status is worsening, though inflammatory markers improving. Pt remains at very high risk of deterioration with protracted tachypnea and immunocompromised status. - Continue remdesivir x5 days.  - Restart cefepime given neutropenia and concern for HCAP. Continue bactrim (PTA medication).  - Steroids x10 days. CRP severely elevated, though level of hypoxemia is modest. PCT also elevated though trending downward. - Due to chemotherapy, not a candidate for tocilizumab. Due to admission for covid, not candidate for  mAb.  - Vitamin C, zinc - Encourage OOB, IS, FV, and awake proning if able - Tylenol and antitussives prn - Continue airborne, contact precautions for 21 days from positive testing. - Check CBC w/diff, CMP, CRP daily - Maintain euvolemia/net negative. - Wheezing, no Dx COPD, etc. Continue combivent and prn albuterol. Continue steroids as above.   Port dysfunction:  - Per IV team. Imaging showed in good position.  CLL:  - Holding therapy per oncology, Dr. Alvy Gross. - Continue PTA bactrim, valganciclovir.   PAF with RVR, NICM:  - Metoprolol 150mg  daily, also on entresto. LVEF wnl in April 2021. Start low dose diltiazem q12h and monitor. Anticipate rate control to be complicated by steroids, breathing treatments.   Obesity: Estimated body mass index is 30.45 kg/m as calculated from the following:   Height as of this encounter: 5\' 7"  (1.702 m).   Weight as of this encounter: 88.2 kg.  Hypokalemia:  - Continue supplementation in setting of AFib.  DVT prophylaxis: Eliquis Code Status: Full Family Communication: None at bedside Disposition Plan:  Status is: Inpatient  Remains inpatient appropriate because:Inpatient level of care appropriate due to severity of illness   Dispo: The patient is from: Home              Anticipated d/c is to: Home              Anticipated d/c date is: 3 days              Patient currently is not medically stable to d/c.  Consultants:   None  Procedures:   None  Antimicrobials:  Vancomycin, cefepime 8/2  Remdesivir 8/2 >>   Subjective: Sick Friday night w/fever, constitutional, short of breath Mon  AM, worse today than at admission, starting to wheeze. No hx COPD/bronchitis/asthma. Combivent given in the room. No chest pain.  Objective: Vitals:   05/08/20 0646 05/08/20 1149 05/08/20 2234 05/09/20 0558  BP: 127/90 (!) 129/93 (!) 158/74 (!) 162/89  Pulse: 76 68 74 82  Resp: 17 (!) 22 20 16   Temp: 98 F (36.7 C) 98 F (36.7 C) (!) 97.5  F (36.4 C) 99.8 F (37.7 C)  TempSrc: Oral Oral Oral Oral  SpO2: 96% 98% 100% 98%  Weight:      Height:        Intake/Output Summary (Last 24 hours) at 05/09/2020 1138 Last data filed at 05/09/2020 0537 Gross per 24 hour  Intake 860.02 ml  Output --  Net 860.02 ml   Filed Weights   05/07/20 0813  Weight: 88.2 kg    Gen: 59 y.o. female in no distress Pulm: Tachypneic at rest with no inspiratory crackles, but diffuse expiratory rhonchi/wheezes.  CV: Regular rate and rhythm. No murmur, rub, or gallop. No JVD, no pitting pedal edema. GI: Abdomen soft, non-tender, non-distended, with normoactive bowel sounds. No organomegaly or masses felt. Ext: Warm, no deformities Skin: No rashes, lesions or ulcers Neuro: Alert and oriented. No focal neurological deficits. Psych: Judgement and insight appear normal. Mood & affect appropriate.   Data Reviewed: I have personally reviewed following labs and imaging studies  CBC: Recent Labs  Lab 05/07/20 0927 05/08/20 0504  WBC 4.5 4.0  NEUTROABS  --  0.8*  HGB 12.4 11.5*  HCT 37.7 34.7*  MCV 87.9 87.4  PLT 154 644*   Basic Metabolic Panel: Recent Labs  Lab 05/07/20 0927 05/08/20 0504  NA 138 140  K 2.7* 3.4*  CL 102 109  CO2 21* 21*  GLUCOSE 119* 189*  BUN 16 17  CREATININE 0.92 0.70  CALCIUM 8.7* 9.1   GFR: Estimated Creatinine Clearance: 86.3 mL/min (by C-G formula based on SCr of 0.7 mg/dL). Liver Function Tests: Recent Labs  Lab 05/07/20 0927 05/08/20 0504  AST 36 27  ALT 20 22  ALKPHOS 41 35*  BILITOT 2.2* 0.9  PROT 7.3 6.3*  ALBUMIN 4.2 3.5   Recent Labs  Lab 05/07/20 0927  LIPASE 23   No results for input(s): AMMONIA in the last 168 hours. Coagulation Profile: Recent Labs  Lab 05/07/20 0926  INR 1.7*   Cardiac Enzymes: No results for input(s): CKTOTAL, CKMB, CKMBINDEX, TROPONINI in the last 168 hours. BNP (last 3 results) No results for input(s): PROBNP in the last 8760 hours. HbA1C: No results  for input(s): HGBA1C in the last 72 hours. CBG: No results for input(s): GLUCAP in the last 168 hours. Lipid Profile: Recent Labs    05/07/20 0926  TRIG 132   Thyroid Function Tests: No results for input(s): TSH, T4TOTAL, FREET4, T3FREE, THYROIDAB in the last 72 hours. Anemia Panel: Recent Labs    05/07/20 1026 05/08/20 0504  FERRITIN 2,519* 2,778*   Urine analysis:    Component Value Date/Time   COLORURINE YELLOW 05/07/2020 [place] 05/07/2020 1420   LABSPEC 1.011 05/07/2020 1420   PHURINE 5.0 05/07/2020 1420   GLUCOSEU NEGATIVE 05/07/2020 1420   GLUCOSEU NEGATIVE 07/26/2013 0934   HGBUR SMALL (A) 05/07/2020 1420   BILIRUBINUR NEGATIVE 05/07/2020 1420   KETONESUR 20 (A) 05/07/2020 1420   PROTEINUR 30 (A) 05/07/2020 1420   UROBILINOGEN 0.2 07/26/2013 0934   NITRITE NEGATIVE 05/07/2020 1420   LEUKOCYTESUR NEGATIVE 05/07/2020 1420   Recent Results (from  the past 240 hour(s))  Blood culture (routine x 2)     Status: None (Preliminary result)   Collection Time: 05/07/20  9:27 AM   Specimen: BLOOD  Result Value Ref Range Status   Specimen Description   Final    BLOOD PORTA CATH Performed at [facility] [address] [place], [place] [address]    Special Requests   Final    BOTTLES DRAWN AEROBIC AND ANAEROBIC Blood Culture adequate volume Performed at [facility] [address] [place], [place] [address]    Culture   Final    NO GROWTH 2 DAYS Performed at [facility] [address], [place], [place] [address]    Report Status PENDING  Incomplete  Urine culture     Status: None   Collection Time: 05/07/20  2:20 PM   Specimen: In/Out Cath Urine  Result Value Ref Range Status   Specimen Description   Final    IN/OUT CATH URINE Performed at [facility] [address], [place], [place] [address]    Special Requests   Final    NONE Performed at [facility],  [place] [address] [place], [place] [address]    Culture   Final    NO GROWTH Performed at [facility], [place] [address] [place],  [address]    Report Status 05/08/2020 FINAL  Final      Radiology Studies: No results found.  Scheduled Meds: . apixaban  5 mg Oral BID  . vitamin C  500 mg Oral Daily  . Chlorhexidine Gluconate Cloth  6 each Topical Daily  . cholecalciferol  1,000 Units Oral Q breakfast  . dexamethasone (DECADRON) injection  6 mg Intravenous Q24H  . Ipratropium-Albuterol  1 puff Inhalation Q6H  . metoprolol succinate  150 mg Oral Q breakfast  . pantoprazole  40 mg Oral BID  . sacubitril-valsartan  1 tablet Oral BID  . sodium chloride flush  10-40 mL Intracatheter Q12H  . sulfamethoxazole-trimethoprim  1 tablet Oral Daily  . valGANciclovir  900 mg Oral Daily  . zinc sulfate  220 mg Oral Daily   Continuous Infusions: . ceFEPime (MAXIPIME) IV 2 g (05/09/20 0537)  . diltiazem (CARDIZEM) infusion Stopped (05/08/20 0445)  . remdesivir 100 mg in NS 100 mL 100 mg (05/08/20 1106)  . vancomycin Stopped (05/08/20 2330)     LOS: 2 days   Time spent: 35 minutes.  Patrecia Pour, MD [facility] [email or web address] 05/09/2020, 11:38 AM

## 2020-05-09 NOTE — Plan of Care (Signed)

## 2020-05-09 NOTE — Progress Notes (Signed)
Attempted to remove TPA a second time from previous dose and medication did not help declot port. Port a cath was re accessed and blood return came sluggish, but also presented with saline or bubbles. TPA will be re instilled another 2 hours per patient's request. I advised patient to allow me to place a PIV so that she would not get to far behind on her medications and the patient refused at this time. She stated she wanted to try the TPA again and if the TPA does not work this go around then we will place a piv. Lab work has been pending since 800am when lab notified IV team of blood draw. Alvester Chou from lab was notified around 830am that the port was not pulling back blood. After TPA is removed for the third time I will try to pull lab work off of port per patient request. MD Alfredia Ferguson sent a secure chat to be made aware of the port a cath issues. RN caring for the patient also made aware.

## 2020-05-09 NOTE — Progress Notes (Signed)
RN Charlann Lange is going to notify IV RN when TPA arrives on the floor for placement in port

## 2020-05-10 ENCOUNTER — Ambulatory Visit: Payer: BC Managed Care – PPO

## 2020-05-10 LAB — COMPREHENSIVE METABOLIC PANEL
ALT: 24 U/L (ref 0–44)
AST: 22 U/L (ref 15–41)
Albumin: 3.4 g/dL — ABNORMAL LOW (ref 3.5–5.0)
Alkaline Phosphatase: 33 U/L — ABNORMAL LOW (ref 38–126)
Anion gap: 7 (ref 5–15)
BUN: 17 mg/dL (ref 6–20)
CO2: 21 mmol/L — ABNORMAL LOW (ref 22–32)
Calcium: 8.4 mg/dL — ABNORMAL LOW (ref 8.9–10.3)
Chloride: 108 mmol/L (ref 98–111)
Creatinine, Ser: 0.68 mg/dL (ref 0.44–1.00)
GFR calc Af Amer: >60 mL/min (ref >60)
GFR calc non Af Amer: >60 mL/min (ref >60)
Glucose, Bld: 247 mg/dL — ABNORMAL HIGH (ref 70–99)
Potassium: 3.7 mmol/L (ref 3.5–5.1)
Sodium: 136 mmol/L (ref 135–145)
Total Bilirubin: 1 mg/dL (ref 0.3–1.2)
Total Protein: 6.3 g/dL — ABNORMAL LOW (ref 6.5–8.1)

## 2020-05-10 LAB — C-REACTIVE PROTEIN: CRP: 20.6 mg/dL — ABNORMAL HIGH (ref <1.0)

## 2020-05-10 LAB — GLUCOSE, CAPILLARY
Glucose-Capillary: 144 mg/dL — ABNORMAL HIGH (ref 70–99)
Glucose-Capillary: 168 mg/dL — ABNORMAL HIGH (ref 70–99)

## 2020-05-10 LAB — MAGNESIUM: Magnesium: 2.6 mg/dL — ABNORMAL HIGH (ref 1.7–2.4)

## 2020-05-10 LAB — D-DIMER, QUANTITATIVE: D-Dimer, Quant: 0.64 ug/mL-FEU — ABNORMAL HIGH (ref 0.00–0.50)

## 2020-05-10 MED ORDER — INSULIN ASPART 100 UNIT/ML ~~LOC~~ SOLN: 0.0000 [IU] | Freq: Every day | SUBCUTANEOUS | Status: DC

## 2020-05-10 MED ORDER — INSULIN ASPART 100 UNIT/ML ~~LOC~~ SOLN
0.0000 [IU] | Freq: Three times a day (TID) | SUBCUTANEOUS | Status: DC
  Administered 2020-05-10: 1 [IU] via SUBCUTANEOUS
  Administered 2020-05-11: 2 [IU] via SUBCUTANEOUS
  Administered 2020-05-11 (×2): 1 [IU] via SUBCUTANEOUS
  Administered 2020-05-12 (×2): 2 [IU] via SUBCUTANEOUS

## 2020-05-10 NOTE — Progress Notes (Addendum)
PROGRESS NOTE  Audrey Gross  ZOX:[id number] DOB: March 24, 1961 DOA: 05/07/2020 PCP: Janith Lima, MD  Outpatient Specialists: Oncology, Dr. Alvy Bimler Brief Narrative: Audrey Gross is a 59 y.o. 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 She lives with her daughter who recently returned from [place] with covid and SARS-CoV-2 antigen testing was confirmed to be positive. She was febrile to 101.62F, tachypneic with SpO2 nadir 93%, developed AFib with RVR. CRP noted to be grossly elevated at 31.4, and CXR demonstrated opacity at left base.   Vancomycin, cefepime initially given, then changed to remdesivir for covid-19 pneumonia. Diltiazem infusion started with improvement in heart rate.  Assessment & Plan: Principal Problem:   Pneumonia due to COVID-19 virus Active Problems:   Depression   Essential hypertension   Dyslipidemia (high LDL; low HDL)   Hypokalemia   PAF (paroxysmal atrial fibrillation) (HCC)   Nonischemic cardiomyopathy (HCC)  Sepsis and acute respiratory distress due to covid-19 and superimposed bacterial LLL pneumonia: SARS-CoV-2 Ag positive on 8/2. Note history of pulmonary leukostasis 2018 which is not suggested by her current clinical scenario. - Inflammatory markers continue to be markedly elevated, up from yesterday, though fortunately not growing hypoxemic at this time. With infiltrates, and the patient being earlier in disease course and immunocompromised status, she remains at very high risk of deterioration. She remains tachypneic and dyspneic. - Continue remdesivir x5 days. - Restarted cefepime given neutropenia and concern for HCAP. Continue bactrim (PTA medication).  - Steroids x10 days. CRP severely elevated, though level of hypoxemia is modest. PCT also  elevated though trending downward. - Due to chemotherapy, not a candidate for tocilizumab. Due to admission for covid, not candidate for mAb.  - Continue airborne, contact precautions for 21 days from positive testing. - Wheezing, no Dx COPD, etc. Continue combivent and prn albuterol. Continue steroids as above. This is improving but may warrant PFTs after discharge. - CXR in AM.  Port dysfunction: Resolved. Imaging showed in good position.  CLL:  - Holding therapy per oncology, Dr. Alvy Bimler. - Continue PTA bactrim, valganciclovir.   Neutropenia:  - Will touch base with heme/onc regarding need for granix, etc.  PAF with RVR, NICM, HTN:  - Metoprolol 150mg  daily, also on entresto. LVEF wnl in April 2021. Started low dose diltiazem q12h with improvement in rate control, will hold if bradycardic. - Consider additional agent if BP rises in setting of steroids.  Obesity: Estimated body mass index is 30.45 kg/m as calculated from the following:   Height as of this encounter: 5\' 7"  (1.702 m).   Weight as of this encounter: 88.2 kg.  Hypokalemia:  - Continue supplementation in setting of AFib.  Hyperglycemia:  - Check HbA1c. Start SSI.  DVT prophylaxis: Eliquis Code Status: Full Family Communication: None at bedside; spoke with husband by phone Disposition Plan:  Status is: Inpatient  Remains inpatient appropriate because:Inpatient level of care appropriate due to severity of illness. Note inflammatory marker rising with fever in setting of neutropenia. Dispo: The patient is from: Home              Anticipated d/c is to: Home              Anticipated d/c date is: 2 days  Patient currently is not medically stable to d/c.  Consultants:   None  Procedures:   None  Antimicrobials:  Vancomycin, cefepime 8/2  Remdesivir 8/2 - 8/6   Subjective: Had fever again last night, feels weak with poor appetite. Still intermittently wheezing. Worried about high BP, 142/86  this PM. Having significant cough and dyspnea. No chest pain or leg swelling reported.  Objective: Vitals:   05/09/20 2052 05/10/20 0139 05/10/20 0453 05/10/20 1159  BP: (!) 156/81 109/70 123/77 (!) 142/86  Pulse: 77 (!) 49 62 64  Resp: 20 (!) 25 20 20   Temp: (!) 101.8 F (38.8 C) 98.6 F (37 C) 98 F (36.7 C) 98.4 F (36.9 C)  TempSrc: Oral Oral Oral Oral  SpO2: 96% 97% 98% 98%  Weight:      Height:        Intake/Output Summary (Last 24 hours) at 05/10/2020 1402 Last data filed at 05/10/2020 0615 Gross per 24 hour  Intake 500 ml  Output --  Net 500 ml   Filed Weights   05/07/20 0813  Weight: 88.2 kg   Gen: 59 y.o. female in no distress Pulm: Tachypneic, expiratory wheezes improved from prior. No crackles on inspiration. CV: Regular rate and rhythm. No murmur, rub, or gallop. No JVD, no dependent edema. GI: Abdomen soft, non-tender, non-distended, with normoactive bowel sounds.  Ext: Warm, no deformities Skin: No rashes, lesions or ulcers on visualized skin. Neuro: Alert and oriented. No focal neurological deficits. Psych: Judgement and insight appear fair. Mood euthymic & affect congruent. Behavior is appropriate.    Data Reviewed: I have personally reviewed following labs and imaging studies  CBC: Recent Labs  Lab 05/07/20 0927 05/08/20 0504 05/09/20 1432  WBC 4.5 4.0 8.3  NEUTROABS  --  0.8* 0.7*  HGB 12.4 11.5* 13.3  HCT 37.7 34.7* 40.3  MCV 87.9 87.4 86.5  PLT 154 146* 812   Basic Metabolic Panel: Recent Labs  Lab 05/07/20 0927 05/08/20 0504 05/09/20 1432 05/10/20 0440  NA 138 140 141 136  K 2.7* 3.4* 2.9* 3.7  CL 102 109 108 108  CO2 21* 21* 21* 21*  GLUCOSE 119* 189* 101* 247*  BUN 16 17 17 17   CREATININE 0.92 0.70 0.87 0.68  CALCIUM 8.7* 9.1 9.0 8.4*  MG  --   --  1.8 2.6*  PHOS  --   --  2.4*  --    GFR: Estimated Creatinine Clearance: 86.3 mL/min (by C-G formula based on SCr of 0.68 mg/dL). Liver Function Tests: Recent Labs  Lab  05/07/20 0927 05/08/20 0504 05/09/20 1432 05/10/20 0440  AST 36 27 29 22   ALT 20 22 25 24   ALKPHOS 41 35* 40 33*  BILITOT 2.2* 0.9 0.9 1.0  PROT 7.3 6.3* 7.1 6.3*  ALBUMIN 4.2 3.5 4.0 3.4*   Recent Labs  Lab 05/07/20 0927  LIPASE 23   No results for input(s): AMMONIA in the last 168 hours. Coagulation Profile: Recent Labs  Lab 05/07/20 0926  INR 1.7*   Cardiac Enzymes: No results for input(s): CKTOTAL, CKMB, CKMBINDEX, TROPONINI in the last 168 hours. BNP (last 3 results) No results for input(s): PROBNP in the last 8760 hours. HbA1C: No results for input(s): HGBA1C in the last 72 hours. CBG: No results for input(s): GLUCAP in the last 168 hours. Lipid Profile: No results for input(s): CHOL, HDL, LDLCALC, TRIG, CHOLHDL, LDLDIRECT in the last 72 hours. Thyroid Function Tests: No results for input(s): TSH, T4TOTAL, FREET4, T3FREE, THYROIDAB in the last  72 hours. Anemia Panel: Recent Labs    05/08/20 0504 05/09/20 1432  VITAMINB12  --  3,525*  FOLATE  --  7.7  FERRITIN 2,778* 3,736*  TIBC  --  253  IRON  --  24*  RETICCTPCT  --  0.7   Urine analysis:    Component Value Date/Time   COLORURINE YELLOW 05/07/2020 1420   APPEARANCEUR CLEAR 05/07/2020 1420   LABSPEC 1.011 05/07/2020 1420   PHURINE 5.0 05/07/2020 1420   GLUCOSEU NEGATIVE 05/07/2020 1420   GLUCOSEU NEGATIVE 07/26/2013 0934   HGBUR SMALL (A) 05/07/2020 1420   BILIRUBINUR NEGATIVE 05/07/2020 1420   KETONESUR 20 (A) 05/07/2020 1420   PROTEINUR 30 (A) 05/07/2020 1420   UROBILINOGEN 0.2 07/26/2013 0934   NITRITE NEGATIVE 05/07/2020 1420   LEUKOCYTESUR NEGATIVE 05/07/2020 1420   Recent Results (from the past 240 hour(s))  Blood culture (routine x 2)     Status: None (Preliminary result)   Collection Time: 05/07/20  9:27 AM   Specimen: BLOOD  Result Value Ref Range Status   Specimen Description   Final    BLOOD PORTA CATH Performed at [facility], [place] [address],  [place], [place] [address]    Special Requests   Final    BOTTLES DRAWN AEROBIC AND ANAEROBIC Blood Culture adequate volume Performed at [facility] [address], [place], [place] [address]    Culture   Final    NO GROWTH 2 DAYS Performed at [facility] [address] [place], [place] [address]    Report Status PENDING  Incomplete  Urine culture     Status: None   Collection Time: 05/07/20  2:20 PM   Specimen: In/Out Cath Urine  Result Value Ref Range Status   Specimen Description   Final    IN/OUT CATH URINE Performed at [facility] [address] [place], [place] [address]    Special Requests   Final    NONE Performed at [facility], [place] [address], [place], [place] [address]    Culture   Final    NO GROWTH Performed at [facility], [place] [address] [place], [place] [address]    Report Status 05/08/2020 FINAL  Final      Radiology Studies: No results found.  Scheduled Meds: . apixaban  5 mg Oral BID  . vitamin C  500 mg Oral Daily  . Chlorhexidine Gluconate Cloth  6 each Topical Daily  . cholecalciferol  1,000 Units Oral Q breakfast  . diltiazem  60 mg Oral Q12H  . Ipratropium-Albuterol  1 puff Inhalation Q6H  . methylPREDNISolone (SOLU-MEDROL) injection  60 mg Intravenous BID  . metoprolol succinate  150 mg Oral Q breakfast  . pantoprazole  40 mg Oral BID  . sacubitril-valsartan  1 tablet Oral BID  . sodium chloride flush  10-40 mL Intracatheter Q12H  . sulfamethoxazole-trimethoprim  1 tablet Oral Daily  . valGANciclovir  900 mg Oral Daily  . zinc sulfate  220 mg Oral Daily   Continuous Infusions: . ceFEPime (MAXIPIME) IV 2 g (05/10/20 0515)  . remdesivir 100 mg in NS 100 mL 100 mg (05/10/20 1023)     LOS: 3 days   Time spent: 25 minutes.  Patrecia Pour, MD [facility] [email or web address] 05/10/2020, 2:02 PM

## 2020-05-10 NOTE — Progress Notes (Signed)
Pharmacy Antibiotic Note  Audrey Gross is a 59 y.o. female with a h/o CLL on Revlimid admitted on 05/07/2020 with COVID PNA.  Pharmacy has been consulted for cefepime and vancomycin dosing for immunocompromised state.  Vancomycin stopped today  Plan: Continue Cefepime 2 g iv q 8 hours.   F/U renal function, culture results, clinical course   Height: 5\' 7"  (170.2 cm) Weight: 88.2 kg (194 lb 6.4 oz) IBW/kg (Calculated) : 61.6  Temp (24hrs), Avg:99.7 F (37.6 C), Min:98 F (36.7 C), Max:101.8 F (38.8 C)  Recent Labs  Lab 05/07/20 0926 05/07/20 0927 05/08/20 0504 05/09/20 1432 05/10/20 0440  WBC  --  4.5 4.0 8.3  --   CREATININE  --  0.92 0.70 0.87 0.68  LATICACIDVEN 1.2  --   --   --   --     Estimated Creatinine Clearance: 86.3 mL/min (by C-G formula based on SCr of 0.68 mg/dL).    No Known Allergies  Antimicrobials this admission: 8/2 cefepime >>  8/2 vancomycin >> 8/5  8/2 remdesivir >>  Dose adjustments this admission:  Microbiology results: 8/2 BCx: ngtd 8/2 UCx: ngf  8/2 COVID Ag: positive  Thank you for allowing pharmacy to be a part of this patient's care.  Dolly Rias RPh 05/10/2020, 10:33 AM

## 2020-05-10 NOTE — Plan of Care (Signed)

## 2020-05-11 ENCOUNTER — Inpatient Hospital Stay (HOSPITAL_COMMUNITY): Payer: BC Managed Care – PPO

## 2020-05-11 LAB — PROCALCITONIN: Procalcitonin: 0.72 ng/mL

## 2020-05-11 LAB — GLUCOSE, CAPILLARY
Glucose-Capillary: 147 mg/dL — ABNORMAL HIGH (ref 70–99)
Glucose-Capillary: 137 mg/dL — ABNORMAL HIGH (ref 70–99)
Glucose-Capillary: 166 mg/dL — ABNORMAL HIGH (ref 70–99)
Glucose-Capillary: 163 mg/dL — ABNORMAL HIGH (ref 70–99)

## 2020-05-11 LAB — D-DIMER, QUANTITATIVE: D-Dimer, Quant: 0.48 ug/mL-FEU (ref 0.00–0.50)

## 2020-05-11 LAB — COMPREHENSIVE METABOLIC PANEL
ALT: 19 U/L (ref 0–44)
AST: 16 U/L (ref 15–41)
Albumin: 3 g/dL — ABNORMAL LOW (ref 3.5–5.0)
Alkaline Phosphatase: 31 U/L — ABNORMAL LOW (ref 38–126)
Anion gap: 9 (ref 5–15)
BUN: 25 mg/dL — ABNORMAL HIGH (ref 6–20)
CO2: 21 mmol/L — ABNORMAL LOW (ref 22–32)
Calcium: 8.5 mg/dL — ABNORMAL LOW (ref 8.9–10.3)
Chloride: 112 mmol/L — ABNORMAL HIGH (ref 98–111)
Creatinine, Ser: 0.66 mg/dL (ref 0.44–1.00)
GFR calc Af Amer: >60 mL/min (ref >60)
GFR calc non Af Amer: >60 mL/min (ref >60)
Glucose, Bld: 171 mg/dL — ABNORMAL HIGH (ref 70–99)
Potassium: 3.4 mmol/L — ABNORMAL LOW (ref 3.5–5.1)
Sodium: 142 mmol/L (ref 135–145)
Total Bilirubin: 0.6 mg/dL (ref 0.3–1.2)
Total Protein: 5.8 g/dL — ABNORMAL LOW (ref 6.5–8.1)

## 2020-05-11 LAB — HEMOGLOBIN A1C
Hgb A1c MFr Bld: 5.7 % — ABNORMAL HIGH (ref 4.8–5.6)
Mean Plasma Glucose: 116.89 mg/dL

## 2020-05-11 LAB — C-REACTIVE PROTEIN: CRP: 16.4 mg/dL — ABNORMAL HIGH (ref <1.0)

## 2020-05-11 MED ORDER — POTASSIUM CHLORIDE CRYS ER 20 MEQ PO TBCR
40.0000 meq | EXTENDED_RELEASE_TABLET | Freq: Once | ORAL | Status: AC
  Administered 2020-05-11: 40 meq via ORAL
  Filled 2020-05-11: qty 2

## 2020-05-11 MED ORDER — FLUCONAZOLE 150 MG PO TABS
150.0000 mg | ORAL_TABLET | Freq: Every day | ORAL | Status: DC
  Administered 2020-05-11 – 2020-05-12 (×2): 150 mg via ORAL
  Filled 2020-05-11 (×2): qty 1

## 2020-05-11 NOTE — Plan of Care (Signed)

## 2020-05-11 NOTE — Progress Notes (Signed)
PROGRESS NOTE  Audrey Gross  IFO:[id number] DOB: 1960/11/24 DOA: 05/07/2020 PCP: Janith Lima, MD  Outpatient Specialists: Oncology, Dr. Alvy Bimler Brief Narrative: Audrey Gross is a 59 y.o. 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 She lives with her daughter who recently returned from [place] with covid and SARS-CoV-2 antigen testing was confirmed to be positive. She was febrile to 101.48F, tachypneic with SpO2 nadir 93%, developed AFib with RVR. CRP noted to be grossly elevated at 31.4, and CXR demonstrated opacity at left base.   Vancomycin, cefepime initially given, then remdesivir added for covid-19 pneumonia. Diltiazem infusion started with improvement in heart rate. Heart rate control has sustained improvement.   Inflammatory markers have remained grossly elevated and follow up CXR reveals developing bilateral infiltrates. Steroids have been continued.   Assessment & Plan: Principal Problem:   Pneumonia due to COVID-19 virus Active Problems:   Depression   Essential hypertension   Dyslipidemia (high LDL; low HDL)   Hypokalemia   PAF (paroxysmal atrial fibrillation) (HCC)   Nonischemic cardiomyopathy (HCC)  Sepsis and acute respiratory distress due to covid-19 pneumonia and superimposed bacterial LLL pneumonia: SARS-CoV-2 Ag positive on 8/2. Note history of pulmonary leukostasis 2018 which is not suggested by her current clinical scenario. - Inflammatory markers continue to be markedly elevated, though seem to be turning downward. CXR indicates increased interstitial infiltrates consistent with covid-19 pneumonia in addition to previously known infiltrate at left base based on my personal review.  - Completed remdesivir (8/2 - 8/6) - Restarted cefepime given  neutropenia and concern for HCAP. Continue bactrim (PTA medication). Last febrile on 8/4 PM, blood culture NGTD. - Steroids x10 days.  - Due to chemotherapy, not a candidate for tocilizumab. Due to admission for covid, not candidate for mAb.  - Continue airborne, contact precautions for 21 days from positive testing. - Wheezing, no Dx COPD, etc. Continue combivent and prn albuterol. Continue steroids as above. This is improving but may warrant PFTs after discharge.  Port dysfunction: Resolved. Imaging showed in good position.  CLL:  - Holding therapy per oncology, Dr. Alvy Bimler. - Continue PTA bactrim, valganciclovir.   Neutropenia:  - Discussed with pt's oncologist, Dr. Alvy Bimler. No indication for further treatment/GCSF at this time.  Odynophagia:  - Treat empirically with diflucan in setting of immunocompromise and antibiotics.  PAF with RVR, NICM, HTN:  - Metoprolol 150mg  daily, also on entresto. LVEF wnl in April 2021. Started low dose diltiazem q12h with improvement in rate control, will hold if bradycardic. - Consider additional agent if BP rises in setting of steroids.  Obesity: Estimated body mass index is 30.45 kg/m as calculated from the following:   Height as of this encounter: 5\' 7"  (1.702 m).   Weight as of this encounter: 88.2 kg.  Hypokalemia:  - Supplement again in setting of AFib.  Hyperglycemia: HbA1c 5.7%.  - At inpatient goal currently.  DVT prophylaxis: Eliquis Code Status: Full Family Communication: None at bedside; spoke with husband by phone 8/5. Disposition Plan:  Status is: Inpatient  Remains inpatient appropriate because:Inpatient level of care appropriate due to severity of illness. New progressive CXR infiltrates on CXR this AM in immunocompromised patient.  Dispo: The patient is from: Home              Anticipated d/c  is to: Home              Anticipated d/c date is: 1 day if afebrile x24 hrs, no development of hypoxemia, and sustained decrease in  inflammatory markers.              Patient currently is not medically stable to d/c.  Consultants:   None  Procedures:   None  Antimicrobials:  Vancomycin, cefepime 8/2  Remdesivir 8/2 - 8/6   Subjective: No fevers, chills, feels weak and tired, but able to ambulate with dyspnea. No chest pain. Having odynophagia, no dysphagia.  Objective: Vitals:   05/10/20 1159 05/10/20 2038 05/11/20 0614 05/11/20 1154  BP: (!) 142/86 (!) 142/77 (!) 142/88 126/86  Pulse: 64 61 (!) 58 (!) 58  Resp: 20 18 17    Temp: 98.4 F (36.9 C) 97.7 F (36.5 C) 98.3 F (36.8 C) 98 F (36.7 C)  TempSrc: Oral Oral Oral Oral  SpO2: 98% 98% 98% 97%  Weight:      Height:        Intake/Output Summary (Last 24 hours) at 05/11/2020 1637 Last data filed at 05/10/2020 2205 Gross per 24 hour  Intake 395 ml  Output --  Net 395 ml   Filed Weights   05/07/20 0813  Weight: 88.2 kg   Gen: 59 y.o. female in no distress Pulm: Nonlabored at rest, tachypneic just with sitting up for exam. L > R base crackles.  CV: Regular rate and rhythm. No murmur, rub, or gallop. No JVD, no dependent edema. GI: Abdomen soft, non-tender, non-distended, with normoactive bowel sounds.  Ext: Warm, no deformities Skin: No rashes, lesions or ulcers on visualized skin. Port site c/d/i. Neuro: Alert and oriented. No focal neurological deficits. Psych: Judgement and insight appear fair. Mood euthymic & affect congruent. Behavior is appropriate.    Data Reviewed: I have personally reviewed following labs and imaging studies  CBC: Recent Labs  Lab 05/07/20 0927 05/08/20 0504 05/09/20 1432  WBC 4.5 4.0 8.3  NEUTROABS  --  0.8* 0.7*  HGB 12.4 11.5* 13.3  HCT 37.7 34.7* 40.3  MCV 87.9 87.4 86.5  PLT 154 146* 371   Basic Metabolic Panel: Recent Labs  Lab 05/07/20 0927 05/08/20 0504 05/09/20 1432 05/10/20 0440 05/11/20 0345  NA 138 140 141 136 142  K 2.7* 3.4* 2.9* 3.7 3.4*  CL 102 109 108 108 112*  CO2 21* 21* 21*  21* 21*  GLUCOSE 119* 189* 101* 247* 171*  BUN 16 17 17 17  25*  CREATININE 0.92 0.70 0.87 0.68 0.66  CALCIUM 8.7* 9.1 9.0 8.4* 8.5*  MG  --   --  1.8 2.6*  --   PHOS  --   --  2.4*  --   --    GFR: Estimated Creatinine Clearance: 86.3 mL/min (by C-G formula based on SCr of 0.66 mg/dL). Liver Function Tests: Recent Labs  Lab 05/07/20 0927 05/08/20 0504 05/09/20 1432 05/10/20 0440 05/11/20 0345  AST 36 27 29 22 16   ALT 20 22 25 24 19   ALKPHOS 41 35* 40 33* 31*  BILITOT 2.2* 0.9 0.9 1.0 0.6  PROT 7.3 6.3* 7.1 6.3* 5.8*  ALBUMIN 4.2 3.5 4.0 3.4* 3.0*   Recent Labs  Lab 05/07/20 0927  LIPASE 23   No results for input(s): AMMONIA in the last 168 hours. Coagulation Profile: Recent Labs  Lab 05/07/20 0926  INR 1.7*   Cardiac Enzymes: No results for input(s): CKTOTAL, CKMB, CKMBINDEX, TROPONINI in the last  168 hours. BNP (last 3 results) No results for input(s): PROBNP in the last 8760 hours. HbA1C: Recent Labs    05/11/20 0345  HGBA1C 5.7*   CBG: Recent Labs  Lab 05/10/20 1627 05/10/20 2105 05/11/20 0935 05/11/20 1151  GLUCAP 144* 168* 147* 137*   Lipid Profile: No results for input(s): CHOL, HDL, LDLCALC, TRIG, CHOLHDL, LDLDIRECT in the last 72 hours. Thyroid Function Tests: No results for input(s): TSH, T4TOTAL, FREET4, T3FREE, THYROIDAB in the last 72 hours. Anemia Panel: Recent Labs    05/09/20 1432  VITAMINB12 3,525*  FOLATE 7.7  FERRITIN 3,736*  TIBC 253  IRON 24*  RETICCTPCT 0.7   Urine analysis:    Component Value Date/Time   COLORURINE YELLOW 05/07/2020 1420   APPEARANCEUR CLEAR 05/07/2020 1420   LABSPEC 1.011 05/07/2020 1420   PHURINE 5.0 05/07/2020 1420   GLUCOSEU NEGATIVE 05/07/2020 1420   GLUCOSEU NEGATIVE 07/26/2013 0934   HGBUR SMALL (A) 05/07/2020 1420   BILIRUBINUR NEGATIVE 05/07/2020 1420   KETONESUR 20 (A) 05/07/2020 1420   PROTEINUR 30 (A) 05/07/2020 1420   UROBILINOGEN 0.2 07/26/2013 0934   NITRITE NEGATIVE 05/07/2020  1420   LEUKOCYTESUR NEGATIVE 05/07/2020 1420   Recent Results (from the past 240 hour(s))  Blood culture (routine x 2)     Status: None (Preliminary result)   Collection Time: 05/07/20  9:27 AM   Specimen: BLOOD  Result Value Ref Range Status   Specimen Description   Final    BLOOD PORTA CATH Performed at [facility], [place] [address] [place], [place] [address]    Special Requests   Final    BOTTLES DRAWN AEROBIC AND ANAEROBIC Blood Culture adequate volume Performed at [facility] [address] [place], [place] [address]    Culture   Final    NO GROWTH 4 DAYS Performed at [facility], [place] [address], [place], [place] [address]    Report Status PENDING  Incomplete  Urine culture     Status: None   Collection Time: 05/07/20  2:20 PM   Specimen: In/Out Cath Urine  Result Value Ref Range Status   Specimen Description   Final    IN/OUT CATH URINE Performed at [facility] [address], [place], [place] [address]    Special Requests   Final    NONE Performed at [facility], [place] [address] [place], [place] [address]    Culture   Final    NO GROWTH Performed at [facility], [place] [address], [place], [place] [address]    Report Status 05/08/2020 FINAL  Final      Radiology Studies: DG CHEST PORT 1 VIEW  Result Date: 05/11/2020 CLINICAL DATA:  COVID.  Respiratory disease. EXAM: PORTABLE CHEST 1 VIEW COMPARISON:  05/07/2020. FINDINGS: PowerPort catheter stable position. Mediastinum hilar structures normal. Stable cardiomegaly. Diffuse bilateral interstitial prominence consistent interstitial edema and/or pneumonitis. Findings have progressed from prior exam. No pleural effusion or pneumothorax. IMPRESSION: 1.  PowerPort catheter stable position. 2.  Stable cardiomegaly. 3. Progressive diffuse bilateral pulmonary interstitial prominence suggesting pneumonitis in this known COVID positive  patient. Electronically Signed   By: Marcello Moores  Register   On: 05/11/2020 06:51    Scheduled Meds: . apixaban  5 mg Oral BID  . vitamin C  500 mg Oral Daily  . Chlorhexidine Gluconate Cloth  6 each Topical Daily  . cholecalciferol  1,000 Units Oral Q breakfast  . diltiazem  60 mg  Oral Q12H  . insulin aspart  0-5 Units Subcutaneous QHS  . insulin aspart  0-9 Units Subcutaneous TID WC  . Ipratropium-Albuterol  1 puff Inhalation Q6H  . methylPREDNISolone (SOLU-MEDROL) injection  60 mg Intravenous BID  . metoprolol succinate  150 mg Oral Q breakfast  . pantoprazole  40 mg Oral BID  . sacubitril-valsartan  1 tablet Oral BID  . sodium chloride flush  10-40 mL Intracatheter Q12H  . sulfamethoxazole-trimethoprim  1 tablet Oral Daily  . valGANciclovir  900 mg Oral Daily  . zinc sulfate  220 mg Oral Daily   Continuous Infusions: . ceFEPime (MAXIPIME) IV 2 g (05/11/20 1351)     LOS: 4 days   Time spent: 25 minutes.  Patrecia Pour, MD [facility] [email or web address] 05/11/2020, 4:37 PM

## 2020-05-12 LAB — CULTURE, BLOOD (ROUTINE X 2)
Culture: NO GROWTH
Special Requests: ADEQUATE

## 2020-05-12 LAB — D-DIMER, QUANTITATIVE: D-Dimer, Quant: 0.35 ug/mL-FEU (ref 0.00–0.50)

## 2020-05-12 LAB — COMPREHENSIVE METABOLIC PANEL
ALT: 18 U/L (ref 0–44)
AST: 14 U/L — ABNORMAL LOW (ref 15–41)
Albumin: 2.8 g/dL — ABNORMAL LOW (ref 3.5–5.0)
Alkaline Phosphatase: 28 U/L — ABNORMAL LOW (ref 38–126)
Anion gap: 10 (ref 5–15)
BUN: 20 mg/dL (ref 6–20)
CO2: 21 mmol/L — ABNORMAL LOW (ref 22–32)
Calcium: 8.2 mg/dL — ABNORMAL LOW (ref 8.9–10.3)
Chloride: 111 mmol/L (ref 98–111)
Creatinine, Ser: 0.67 mg/dL (ref 0.44–1.00)
GFR calc Af Amer: >60 mL/min (ref >60)
GFR calc non Af Amer: >60 mL/min (ref >60)
Glucose, Bld: 164 mg/dL — ABNORMAL HIGH (ref 70–99)
Potassium: 3.3 mmol/L — ABNORMAL LOW (ref 3.5–5.1)
Sodium: 142 mmol/L (ref 135–145)
Total Bilirubin: 0.5 mg/dL (ref 0.3–1.2)
Total Protein: 5.6 g/dL — ABNORMAL LOW (ref 6.5–8.1)

## 2020-05-12 LAB — C-REACTIVE PROTEIN: CRP: 7.8 mg/dL — ABNORMAL HIGH (ref <1.0)

## 2020-05-12 LAB — GLUCOSE, CAPILLARY: Glucose-Capillary: 153 mg/dL — ABNORMAL HIGH (ref 70–99)

## 2020-05-12 LAB — PROCALCITONIN: Procalcitonin: 0.15 ng/mL

## 2020-05-12 MED ORDER — HEPARIN SOD (PORK) LOCK FLUSH 100 UNIT/ML IV SOLN
500.0000 [IU] | INTRAVENOUS | Status: AC | PRN
  Administered 2020-05-12: 500 [IU]
  Filled 2020-05-12: qty 5

## 2020-05-12 MED ORDER — LENALIDOMIDE 10 MG PO CAPS: 10.0000 mg | ORAL_CAPSULE | Freq: Every day | ORAL | 0 refills | Status: AC

## 2020-05-12 MED ORDER — DEXAMETHASONE 6 MG PO TABS
6.0000 mg | ORAL_TABLET | Freq: Every day | ORAL | 0 refills | Status: AC
Start: 2020-05-12 — End: 2020-05-17

## 2020-05-12 MED ORDER — FLUCONAZOLE 150 MG PO TABS: 150.0000 mg | ORAL_TABLET | Freq: Every day | ORAL | 0 refills | Status: AC

## 2020-05-12 NOTE — Discharge Summary (Signed)
Physician Discharge Summary  Audrey Gross ZOX:[id number] DOB: 02/03/1961 DOA: 05/07/2020  PCP: Janith Lima, MD  Admit date: 05/07/2020 Discharge date: 05/12/2020  Admitted From: Home Disposition: Home   Recommendations for Outpatient Follow-up:  1. Follow up with PCP in 1-2 weeks by telehealth or in person after 21-day isolation period ends (8/24).  2. Continue management of HTN/NICM per PCP and/or cardiology. 3. Consider repeat BMP to monitor hypokalemia. 4. Follow up with oncology to inform decision on restarting revlimid.  Home Health: None Equipment/Devices: None Discharge Condition: Stable CODE STATUS: Full Diet recommendation: Heart healthy  Brief/Interim Summary: Audrey Gross is a 59 y.o. female with a history of CLL on chemotherapy, AFib on eliquis, obesity, NICM, MR, HTN, HLD, and covid vaccination (2nd dose 04/21/2020) who developed constitutional symptoms and fever on 7/30, progressing to dyspnea on 8/2 prompting presentation to ED. She lives with her daughter who recently returned from [place] with covid. In the ED, SARS-CoV-2 antigen testing was confirmed to be positive. She was febrile to 101.36F, tachypneic with SpO2 nadir 93%, developed AFib with RVR. CRP noted to be grossly elevated at 31.4, and CXR demonstrated opacity at left base.   Vancomycin, cefepime initially given, then remdesivir added for covid-19 pneumonia. Diltiazem infusion started with improvement in heart rate. Heart rate control has sustained improvement since discontinuation.   Inflammatory markers remained grossly elevated. In the setting of CLL and active chemotherapy, the patient was observed closely for signs or symptoms or deterioration throughout the course of remdesivir therapy. Fortunately, inflammatory markers have demonstrated sustained diminution and she remains without hypoxemia and has had resolution of fevers for 3 days.   Discharge Diagnoses:  Principal Problem:   Pneumonia due to  COVID-19 virus Active Problems:   Depression   Essential hypertension   Dyslipidemia (high LDL; low HDL)   Hypokalemia   PAF (paroxysmal atrial fibrillation) (HCC)   Nonischemic cardiomyopathy (HCC)  Sepsis and acute respiratory distress due to covid-19 pneumonia and superimposed bacterial LLL pneumonia: SARS-CoV-2 Ag positive on 8/2. Has hx pulmonary leukostasis 2018.  - Inflammatory markers improving significantly (CRP 31 >> 7; PCT 4.49 >> 0.15), continues to have no hypoxemia.   - Completed remdesivir (8/2 - 8/6) - Completed course of antibiotics (cefepime), and will continue bactrim. Last febrile on 8/4 PM, blood culture NGTD. - Will continue steroids x10 days.  - Due to chemotherapy, not a candidate for tocilizumab. Due to admission for covid, not candidate for mAb.  - Continue airborne, contact precautions for 21 days from positive testing.  Wheezing: Resolved. No hx COPD. - Consider PFTs after discharge.  Port dysfunction: Resolved. Imaging showed in good position.  CLL:  - Holding therapy per oncology, Dr. Alvy Bimler. - Continue PTA bactrim, valganciclovir.   Neutropenia:  - Discussed with pt's oncologist, Dr. Alvy Bimler. No indication for further treatment/GCSF at this time.  Odynophagia:  - Treat empirically with diflucan in setting of immunocompromise and antibiotics.  PAF with RVR, NICM, HTN:  - Metoprolol 150mg  daily, also on entresto. LVEF wnl in April 2021. Started low dose diltiazem q12h with mild bradycardia. Will not continue at discharge.  Hypokalemia:  - Supplemented. Suggest recheck at follow up.  Hyperglycemia: HbA1c 5.7%.  - At inpatient goal, PCP follow up recommended.  Obesity: Body mass index is 30.45 kg/m.   Discharge Instructions Discharge Instructions    Diet - low sodium heart healthy   Complete by: As directed    Discharge instructions   Complete by: As directed  You are being discharged from the hospital after treatment for covid-19  infection. You are felt to be stable enough to no longer require inpatient monitoring, testing, and treatment, though you will need to follow the recommendations below: - Continue taking decadron (steroid) for 5 more days - Continue taking diflucan (antifungal) to treat possible esophagitis for 14 more days which may help the pain when swallowing.  - Follow up with your PCP, cardiologist, and oncologist for continued care. Do not take revlimid until you follow up with Dr. Alvy Bimler. Otherwise, continue taking medications as you were before this admission. - Per CDC guidelines, you will need to remain in isolation for 21 days from your first positive covid test. - Follow up with your doctor in the next week via telehealth or seek medical attention right away if your symptoms get WORSE.   Directions for you at home:  Wear a facemask You should wear a facemask that covers your nose and mouth when you are in the same room with other people and when you visit a healthcare provider. People who live with or visit you should also wear a facemask while they are in the same room with you.  Separate yourself from other people in your home As much as possible, you should stay in a different room from other people in your home. Also, you should use a separate bathroom, if available.  Avoid sharing household items You should not share dishes, drinking glasses, cups, eating utensils, towels, bedding, or other items with other people in your home. After using these items, you should wash them thoroughly with soap and water.  Cover your coughs and sneezes Cover your mouth and nose with a tissue when you cough or sneeze, or you can cough or sneeze into your sleeve. Throw used tissues in a lined trash can, and immediately wash your hands with soap and water for at least 20 seconds or use an alcohol-based hand rub.  Wash your [facility] your hands often and thoroughly with soap and water for at least 20  seconds. You can use an alcohol-based hand sanitizer if soap and water are not available and if your hands are not visibly dirty. Avoid touching your eyes, nose, and mouth with unwashed hands.  Directions for those who live with, or provide care at home for you:  Limit the number of people who have contact with the patient If possible, have only one caregiver for the patient. Other household members should stay in another home or place of residence. If this is not possible, they should stay in another room, or be separated from the patient as much as possible. Use a separate bathroom, if available. Restrict visitors who do not have an essential need to be in the home.  Ensure good ventilation Make sure that shared spaces in the home have good air flow, such as from an air conditioner or an opened window, weather permitting.  Wash your hands often Wash your hands often and thoroughly with soap and water for at least 20 seconds. You can use an alcohol based hand sanitizer if soap and water are not available and if your hands are not visibly dirty. Avoid touching your eyes, nose, and mouth with unwashed hands. Use disposable paper towels to dry your hands. If not available, use dedicated cloth towels and replace them when they become wet.  Wear a facemask and gloves Wear a disposable facemask at all times in the room and gloves when you touch or have  contact with the patient's blood, body fluids, and/or secretions or excretions, such as sweat, saliva, sputum, nasal mucus, vomit, urine, or feces.  Ensure the mask fits over your nose and mouth tightly, and do not touch it during use. Throw out disposable facemasks and gloves after using them. Do not reuse. Wash your hands immediately after removing your facemask and gloves. If your personal clothing becomes contaminated, carefully remove clothing and launder. Wash your hands after handling contaminated clothing. Place all used disposable  facemasks, gloves, and other waste in a lined container before disposing them with other household waste. Remove gloves and wash your hands immediately after handling these items.  Do not share dishes, glasses, or other household items with the patient Avoid sharing household items. You should not share dishes, drinking glasses, cups, eating utensils, towels, bedding, or other items with a patient who is confirmed to have, or being evaluated for, COVID-19 infection. After the person uses these items, you should wash them thoroughly with soap and water.  Wash laundry thoroughly Immediately remove and wash clothes or bedding that have blood, body fluids, and/or secretions or excretions, such as sweat, saliva, sputum, nasal mucus, vomit, urine, or feces, on them. Wear gloves when handling laundry from the patient. Read and follow directions on labels of laundry or clothing items and detergent. In general, wash and dry with the warmest temperatures recommended on the label.  Clean all areas the individual has used often Clean all touchable surfaces, such as counters, tabletops, doorknobs, bathroom fixtures, toilets, phones, keyboards, tablets, and bedside tables, every day. Also, clean any surfaces that may have blood, body fluids, and/or secretions or excretions on them. Wear gloves when cleaning surfaces the patient has come in contact with. Use a diluted bleach solution (e.g., dilute bleach with 1 part bleach and 10 parts water) or a household disinfectant with a label that says EPA-registered for coronaviruses. To make a bleach solution at home, add 1 tablespoon of bleach to 1 quart (4 cups) of water. For a larger supply, add  cup of bleach to 1 gallon (16 cups) of water. Read labels of cleaning products and follow recommendations provided on product labels. Labels contain instructions for safe and effective use of the cleaning product including precautions you should take when applying the product,  such as wearing gloves or eye protection and making sure you have good ventilation during use of the product. Remove gloves and wash hands immediately after cleaning.  Monitor yourself for signs and symptoms of illness Caregivers and household members are considered close contacts, should monitor their health, and will be asked to limit movement outside of the home to the extent possible. Follow the monitoring steps for close contacts listed on the symptom monitoring form.  If you have additional questions, contact your local health department or call the epidemiologist on call at [phone] (available 24/7). This guidance is subject to change. For the most up-to-date guidance from [facility], please refer to their website: [email or web address]   MyChart COVID-19 home monitoring program   Complete by: May 12, 2020    Is the patient willing to use the [facility] for home monitoring?: Yes     Allergies as of 05/12/2020   No Known Allergies     Medication List    TAKE these medications   dexamethasone 6 MG tablet Commonly known as: Decadron Take 1 tablet (6 mg total) by mouth daily for 5 days.   Eliquis 5 MG Tabs tablet Generic drug: apixaban TAKE 1  TABLET BY MOUTH TWICE A DAY What changed: how much to take   Entresto 49-51 MG Generic drug: sacubitril-valsartan Take 1 tablet by mouth 2 (two) times daily.   fluconazole 150 MG tablet Commonly known as: DIFLUCAN Take 1 tablet (150 mg total) by mouth daily. Start taking on: May 13, 2020   lenalidomide 10 MG capsule Commonly known as: REVLIMID Take 1 capsule (10 mg total) by mouth daily. hold this medication until oncology follow up What changed: additional instructions   lidocaine-prilocaine cream Commonly known as: EMLA Apply 1 application topically as needed. What changed: reasons to take this   metoprolol succinate 100 MG 24 hr tablet Commonly known as:  TOPROL-XL TAKE 1 AND 1/2 TABLETS BY MOUTH EVERY DAY. TAKE WITH FOOD OR RIGHT AFTER A MEAL What changed:   how much to take  how to take this  when to take this  additional instructions   ondansetron 8 MG disintegrating tablet Commonly known as: ZOFRAN-ODT Take 1 tablet (8 mg total) by mouth every 8 (eight) hours as needed for nausea or vomiting.   pantoprazole 40 MG tablet Commonly known as: PROTONIX TAKE 1 TABLET BY MOUTH TWICE A DAY   sulfamethoxazole-trimethoprim 400-80 MG tablet Commonly known as: BACTRIM TAKE 1 TABLET BY MOUTH DAILY.   valGANciclovir 450 MG tablet Commonly known as: VALCYTE TAKE 2 TABLETS (900 MG TOTAL) BY MOUTH DAILY.   Vitamin D-3 25 MCG (1000 UT) Caps Take 1,000 Units by mouth daily with breakfast.       Follow-up Information    Janith Lima, MD. Schedule an appointment as soon as possible for a visit in 1 week(s).   Specialty: Internal Medicine Contact information: [place] [address] [phone]        Heath Lark, MD. Schedule an appointment as soon as possible for a visit in 1 week(s).   Specialty: Hematology and Oncology Contact information: [place] [id number] [phone]              No Known Allergies  Consultations:  Oncology, Dr. Alvy Bimler by phone.  Procedures/Studies: CT CHEST W CONTRAST  Result Date: 04/16/2020 CLINICAL DATA:  Chronic lymphocytic leukemia diagnosed in 2010. Evaluate for treatment response. Ongoing chemotherapy. EXAM: CT CHEST, ABDOMEN, AND PELVIS WITH CONTRAST TECHNIQUE: Multidetector CT imaging of the chest, abdomen and pelvis was performed following the standard protocol during bolus administration of intravenous contrast. CONTRAST:  157mL OMNIPAQUE IOHEXOL 300 MG/ML  SOLN COMPARISON:  01/09/2020 FINDINGS: CT CHEST FINDINGS Cardiovascular: Right Port-A-Cath tip at high right atrium. Aortic atherosclerosis. Mild cardiomegaly. Multivessel  coronary artery atherosclerosis. Pulmonary artery enlargement, outflow tract 3.4 cm. Mediastinum/Nodes: Bilateral axillary adenopathy. Right axillary index node measures 1.6 x 3.4 cm on 12/02. Compare 1.5 x 2.8 cm on the prior. More caudal right axillary node measures 9 mm on 16/2 versus 6 mm on the prior. Index left axillary node measures 1.6 cm on 12/02 versus 1.4 cm on the prior. Just lateral to this, a left axillary node measures 1.0 cm on 16/2 versus 8 mm on the prior. Left infrahilar node measures 1.2 cm on 25/2, new since the prior. Lungs/Pleura: No pleural fluid. Well-circumscribed right lower lobe pulmonary nodule measures 11 x 10 mm on 79/6 and is similar to 12 x 10 mm on the prior exam. Musculoskeletal: No acute osseous abnormality. CT ABDOMEN PELVIS FINDINGS Hepatobiliary: Normal liver. Normal gallbladder, without biliary ductal dilatation. Pancreas: Normal, without mass or ductal dilatation. Spleen: Normal in size, without  focal abnormality. Adrenals/Urinary Tract: Normal adrenal glands. 9 mm low-density right renal lesion is likely a cyst. Normal left kidney. No hydronephrosis. Normal urinary bladder. Stomach/Bowel: Normal stomach, without wall thickening. Normal colon, appendix, and terminal ileum. Normal small bowel. Vascular/Lymphatic: Aortic atherosclerosis. Left abdominal retroperitoneal 8 mm node on 76/2, enlarged from 5 mm on the prior. Portal caval node measures 1.2 cm on 60/2 versus 1.0 cm on the prior exam (when remeasured). Small bowel mesenteric 9 mm node on 69/2 is enlarged from 5 mm on the prior. Right obturator 1.2 x 2.9 cm node on 103/2 is enlarged from 0.8 x 2.2 cm on the prior exam (when remeasured). Left external iliac node measures 1.2 cm on 100/2 versus 1.0 cm on the prior exam (when remeasured). Reproductive: Uterus positioned eccentric right.  No adnexal mass. Other: No significant free fluid.  Mild pelvic floor laxity. Musculoskeletal: Mild superior endplate compression  deformity at L1 is not significantly changed. IMPRESSION: 1. Mild enlargement of nodes within the chest, abdomen, and pelvis, as detailed above. Findings consistent with disease progression. 2. Age advanced coronary artery atherosclerosis. Recommend assessment of coronary risk factors and consideration of medical therapy. 3.  Aortic Atherosclerosis (ICD10-I70.0). 4. Pulmonary artery enlargement suggests pulmonary arterial hypertension. 5. Similar well-circumscribed right lower lobe pulmonary nodule. Favored to be benign. Recommend attention on follow-up. Electronically Signed   By: Abigail Miyamoto M.D.   On: 04/16/2020 09:45   CT ABDOMEN PELVIS W CONTRAST  Result Date: 04/16/2020 CLINICAL DATA:  Chronic lymphocytic leukemia diagnosed in 2010. Evaluate for treatment response. Ongoing chemotherapy. EXAM: CT CHEST, ABDOMEN, AND PELVIS WITH CONTRAST TECHNIQUE: Multidetector CT imaging of the chest, abdomen and pelvis was performed following the standard protocol during bolus administration of intravenous contrast. CONTRAST:  191mL OMNIPAQUE IOHEXOL 300 MG/ML  SOLN COMPARISON:  01/09/2020 FINDINGS: CT CHEST FINDINGS Cardiovascular: Right Port-A-Cath tip at high right atrium. Aortic atherosclerosis. Mild cardiomegaly. Multivessel coronary artery atherosclerosis. Pulmonary artery enlargement, outflow tract 3.4 cm. Mediastinum/Nodes: Bilateral axillary adenopathy. Right axillary index node measures 1.6 x 3.4 cm on 12/02. Compare 1.5 x 2.8 cm on the prior. More caudal right axillary node measures 9 mm on 16/2 versus 6 mm on the prior. Index left axillary node measures 1.6 cm on 12/02 versus 1.4 cm on the prior. Just lateral to this, a left axillary node measures 1.0 cm on 16/2 versus 8 mm on the prior. Left infrahilar node measures 1.2 cm on 25/2, new since the prior. Lungs/Pleura: No pleural fluid. Well-circumscribed right lower lobe pulmonary nodule measures 11 x 10 mm on 79/6 and is similar to 12 x 10 mm on the prior  exam. Musculoskeletal: No acute osseous abnormality. CT ABDOMEN PELVIS FINDINGS Hepatobiliary: Normal liver. Normal gallbladder, without biliary ductal dilatation. Pancreas: Normal, without mass or ductal dilatation. Spleen: Normal in size, without focal abnormality. Adrenals/Urinary Tract: Normal adrenal glands. 9 mm low-density right renal lesion is likely a cyst. Normal left kidney. No hydronephrosis. Normal urinary bladder. Stomach/Bowel: Normal stomach, without wall thickening. Normal colon, appendix, and terminal ileum. Normal small bowel. Vascular/Lymphatic: Aortic atherosclerosis. Left abdominal retroperitoneal 8 mm node on 76/2, enlarged from 5 mm on the prior. Portal caval node measures 1.2 cm on 60/2 versus 1.0 cm on the prior exam (when remeasured). Small bowel mesenteric 9 mm node on 69/2 is enlarged from 5 mm on the prior. Right obturator 1.2 x 2.9 cm node on 103/2 is enlarged from 0.8 x 2.2 cm on the prior exam (when remeasured). Left external iliac node  measures 1.2 cm on 100/2 versus 1.0 cm on the prior exam (when remeasured). Reproductive: Uterus positioned eccentric right.  No adnexal mass. Other: No significant free fluid.  Mild pelvic floor laxity. Musculoskeletal: Mild superior endplate compression deformity at L1 is not significantly changed. IMPRESSION: 1. Mild enlargement of nodes within the chest, abdomen, and pelvis, as detailed above. Findings consistent with disease progression. 2. Age advanced coronary artery atherosclerosis. Recommend assessment of coronary risk factors and consideration of medical therapy. 3.  Aortic Atherosclerosis (ICD10-I70.0). 4. Pulmonary artery enlargement suggests pulmonary arterial hypertension. 5. Similar well-circumscribed right lower lobe pulmonary nodule. Favored to be benign. Recommend attention on follow-up. Electronically Signed   By: Abigail Miyamoto M.D.   On: 04/16/2020 09:45   DG CHEST PORT 1 VIEW  Result Date: 05/11/2020 CLINICAL DATA:  COVID.   Respiratory disease. EXAM: PORTABLE CHEST 1 VIEW COMPARISON:  05/07/2020. FINDINGS: PowerPort catheter stable position. Mediastinum hilar structures normal. Stable cardiomegaly. Diffuse bilateral interstitial prominence consistent interstitial edema and/or pneumonitis. Findings have progressed from prior exam. No pleural effusion or pneumothorax. IMPRESSION: 1.  PowerPort catheter stable position. 2.  Stable cardiomegaly. 3. Progressive diffuse bilateral pulmonary interstitial prominence suggesting pneumonitis in this known COVID positive patient. Electronically Signed   By: Marcello Moores  Register   On: 05/11/2020 06:51   DG Chest Portable 1 View  Result Date: 05/07/2020 CLINICAL DATA:  Fever EXAM: PORTABLE CHEST 1 VIEW COMPARISON:  10/02/2017 FINDINGS: Right Port-A-Cath is in place with the tip at the cavoatrial junction. Mild cardiomegaly. Left lower lobe airspace opacity. Right lung clear. No effusions or acute bony abnormality. IMPRESSION: Left lower lobe opacity concerning for pneumonia. Cardiomegaly. Electronically Signed   By: Rolm Baptise M.D.   On: 05/07/2020 09:20   Subjective: Feels less ill. Coughing but not short of breath. No chest pain or leg swelling noted. No fevers. Eating ok, getting up in room and to bathroom independently. Some pain swallowing previously reported.   Discharge Exam: Vitals:   05/12/20 0528 05/12/20 0959  BP: (!) 142/80 (!) 145/89  Pulse: (!) 50 62  Resp: 20   Temp: 98.2 F (36.8 C)   SpO2: 99%    General: Pt is alert, awake, not in acute distress Cardiovascular: RRR, S1/S2 +, no rubs, no gallops Respiratory: CTA bilaterally, no wheezing, no rhonchi Abdominal: Soft, NT, ND, bowel sounds + Extremities: No edema, no cyanosis  Labs: Basic Metabolic Panel: Recent Labs  Lab 05/08/20 0504 05/09/20 1432 05/10/20 0440 05/11/20 0345 05/12/20 0445  NA 140 141 136 142 142  K 3.4* 2.9* 3.7 3.4* 3.3*  CL 109 108 108 112* 111  CO2 21* 21* 21* 21* 21*  GLUCOSE 189*  101* 247* 171* 164*  BUN 17 17 17  25* 20  CREATININE 0.70 0.87 0.68 0.66 0.67  CALCIUM 9.1 9.0 8.4* 8.5* 8.2*  MG  --  1.8 2.6*  --   --   PHOS  --  2.4*  --   --   --    Liver Function Tests: Recent Labs  Lab 05/08/20 0504 05/09/20 1432 05/10/20 0440 05/11/20 0345 05/12/20 0445  AST 27 29 22 16  14*  ALT 22 25 24 19 18   ALKPHOS 35* 40 33* 31* 28*  BILITOT 0.9 0.9 1.0 0.6 0.5  PROT 6.3* 7.1 6.3* 5.8* 5.6*  ALBUMIN 3.5 4.0 3.4* 3.0* 2.8*   Recent Labs  Lab 05/07/20 0927  LIPASE 23   No results for input(s): AMMONIA in the last 168 hours. CBC: Recent Labs  Lab 05/07/20 0927 05/08/20 0504 05/09/20 1432  WBC 4.5 4.0 8.3  NEUTROABS  --  0.8* 0.7*  HGB 12.4 11.5* 13.3  HCT 37.7 34.7* 40.3  MCV 87.9 87.4 86.5  PLT 154 146* 212   CBG: Recent Labs  Lab 05/11/20 0935 05/11/20 1151 05/11/20 1644 05/11/20 2124 05/12/20 0811  GLUCAP 147* 137* 166* 163* 153*   D-Dimer Recent Labs    05/11/20 0345 05/12/20 0445  DDIMER 0.48 0.35   Hgb A1c Recent Labs    05/11/20 0345  HGBA1C 5.7*   Anemia work up Recent Labs    05/09/20 1432  VITAMINB12 3,525*  FOLATE 7.7  FERRITIN 3,736*  TIBC 253  IRON 24*  RETICCTPCT 0.7   Microbiology Recent Results (from the past 240 hour(s))  Blood culture (routine x 2)     Status: None   Collection Time: 05/07/20  9:27 AM   Specimen: BLOOD  Result Value Ref Range Status   Specimen Description   Final    BLOOD PORTA CATH Performed at [facility], [place] [address], [place], [place] [address]    Special Requests   Final    BOTTLES DRAWN AEROBIC AND ANAEROBIC Blood Culture adequate volume Performed at [facility] [address], [place], [place] [address]    Culture   Final    NO GROWTH 5 DAYS Performed at [facility], [place] [address] [place], [place] [address]    Report Status 05/12/2020 FINAL  Final  Urine culture     Status: None   Collection Time: 05/07/20  2:20 PM    Specimen: In/Out Cath Urine  Result Value Ref Range Status   Specimen Description   Final    IN/OUT CATH URINE Performed at [facility] [address], [place], [place] [address]    Special Requests   Final    NONE Performed at [facility], [place] [address] [place], [place] [address]    Culture   Final    NO GROWTH Performed at [facility], [place] [address] [place],  [address]    Report Status 05/08/2020 FINAL  Final    Time coordinating discharge: Approximately 40 minutes  Patrecia Pour, MD  [facility] 05/12/2020, 11:00 AM

## 2020-05-14 ENCOUNTER — Telehealth: Payer: Self-pay

## 2020-05-14 NOTE — Telephone Encounter (Signed)
Called and given below message. She verbalized understanding. Scheduled appt for lab at 8:45 and see Dr. Alvy Bimler at [phone]. Appt scheduled. She is aware of the appt times.

## 2020-05-14 NOTE — Telephone Encounter (Signed)
-----   Message from Heath Lark, MD sent at 05/14/2020  6:55 AM EDT ----- Regarding: return appt She tested positive 8/2, so earliest can see me is 8/23 Can you call and schedule labs and see me 20 mins the week of 8/23? Tell her do not start revlimid until after her appt

## 2020-05-20 ENCOUNTER — Emergency Department (HOSPITAL_COMMUNITY): Payer: BC Managed Care – PPO

## 2020-05-20 ENCOUNTER — Encounter (HOSPITAL_COMMUNITY): Payer: Self-pay | Admitting: Obstetrics and Gynecology

## 2020-05-20 ENCOUNTER — Inpatient Hospital Stay (HOSPITAL_COMMUNITY)
Admission: EM | Admit: 2020-05-20 | Discharge: 2020-07-06 | DRG: 207 | Disposition: E | Payer: BC Managed Care – PPO | Attending: Pulmonary Disease | Admitting: Pulmonary Disease

## 2020-05-20 ENCOUNTER — Other Ambulatory Visit: Payer: Self-pay

## 2020-05-20 DIAGNOSIS — Z79899 Other long term (current) drug therapy: Secondary | ICD-10-CM

## 2020-05-20 DIAGNOSIS — D6489 Other specified anemias: Secondary | ICD-10-CM | POA: Diagnosis not present

## 2020-05-20 DIAGNOSIS — Z4682 Encounter for fitting and adjustment of non-vascular catheter: Secondary | ICD-10-CM | POA: Diagnosis not present

## 2020-05-20 DIAGNOSIS — G9341 Metabolic encephalopathy: Secondary | ICD-10-CM | POA: Diagnosis not present

## 2020-05-20 DIAGNOSIS — J1282 Pneumonia due to coronavirus disease 2019: Secondary | ICD-10-CM | POA: Diagnosis present

## 2020-05-20 DIAGNOSIS — Z66 Do not resuscitate: Secondary | ICD-10-CM | POA: Diagnosis not present

## 2020-05-20 DIAGNOSIS — R7989 Other specified abnormal findings of blood chemistry: Secondary | ICD-10-CM | POA: Diagnosis not present

## 2020-05-20 DIAGNOSIS — R9431 Abnormal electrocardiogram [ECG] [EKG]: Secondary | ICD-10-CM | POA: Diagnosis present

## 2020-05-20 DIAGNOSIS — E872 Acidosis: Secondary | ICD-10-CM | POA: Diagnosis present

## 2020-05-20 DIAGNOSIS — I340 Nonrheumatic mitral (valve) insufficiency: Secondary | ICD-10-CM | POA: Diagnosis present

## 2020-05-20 DIAGNOSIS — J80 Acute respiratory distress syndrome: Secondary | ICD-10-CM | POA: Diagnosis not present

## 2020-05-20 DIAGNOSIS — J9600 Acute respiratory failure, unspecified whether with hypoxia or hypercapnia: Secondary | ICD-10-CM

## 2020-05-20 DIAGNOSIS — I428 Other cardiomyopathies: Secondary | ICD-10-CM | POA: Diagnosis present

## 2020-05-20 DIAGNOSIS — T380X5A Adverse effect of glucocorticoids and synthetic analogues, initial encounter: Secondary | ICD-10-CM | POA: Diagnosis not present

## 2020-05-20 DIAGNOSIS — E876 Hypokalemia: Secondary | ICD-10-CM | POA: Diagnosis present

## 2020-05-20 DIAGNOSIS — R0902 Hypoxemia: Secondary | ICD-10-CM

## 2020-05-20 DIAGNOSIS — Y92239 Unspecified place in hospital as the place of occurrence of the external cause: Secondary | ICD-10-CM | POA: Diagnosis not present

## 2020-05-20 DIAGNOSIS — R0602 Shortness of breath: Secondary | ICD-10-CM

## 2020-05-20 DIAGNOSIS — F419 Anxiety disorder, unspecified: Secondary | ICD-10-CM | POA: Diagnosis not present

## 2020-05-20 DIAGNOSIS — J9601 Acute respiratory failure with hypoxia: Secondary | ICD-10-CM | POA: Diagnosis not present

## 2020-05-20 DIAGNOSIS — E878 Other disorders of electrolyte and fluid balance, not elsewhere classified: Secondary | ICD-10-CM | POA: Diagnosis present

## 2020-05-20 DIAGNOSIS — Z841 Family history of disorders of kidney and ureter: Secondary | ICD-10-CM

## 2020-05-20 DIAGNOSIS — I5022 Chronic systolic (congestive) heart failure: Secondary | ICD-10-CM | POA: Diagnosis not present

## 2020-05-20 DIAGNOSIS — A4189 Other specified sepsis: Secondary | ICD-10-CM | POA: Diagnosis not present

## 2020-05-20 DIAGNOSIS — Z6830 Body mass index (BMI) 30.0-30.9, adult: Secondary | ICD-10-CM | POA: Diagnosis not present

## 2020-05-20 DIAGNOSIS — Z4659 Encounter for fitting and adjustment of other gastrointestinal appliance and device: Secondary | ICD-10-CM

## 2020-05-20 DIAGNOSIS — J9690 Respiratory failure, unspecified, unspecified whether with hypoxia or hypercapnia: Secondary | ICD-10-CM | POA: Diagnosis not present

## 2020-05-20 DIAGNOSIS — R579 Shock, unspecified: Secondary | ICD-10-CM | POA: Diagnosis not present

## 2020-05-20 DIAGNOSIS — Z7901 Long term (current) use of anticoagulants: Secondary | ICD-10-CM

## 2020-05-20 DIAGNOSIS — R05 Cough: Secondary | ICD-10-CM | POA: Diagnosis not present

## 2020-05-20 DIAGNOSIS — J159 Unspecified bacterial pneumonia: Secondary | ICD-10-CM | POA: Diagnosis present

## 2020-05-20 DIAGNOSIS — J189 Pneumonia, unspecified organism: Secondary | ICD-10-CM | POA: Diagnosis not present

## 2020-05-20 DIAGNOSIS — B342 Coronavirus infection, unspecified: Secondary | ICD-10-CM | POA: Diagnosis not present

## 2020-05-20 DIAGNOSIS — D61818 Other pancytopenia: Secondary | ICD-10-CM | POA: Diagnosis not present

## 2020-05-20 DIAGNOSIS — R739 Hyperglycemia, unspecified: Secondary | ICD-10-CM | POA: Diagnosis not present

## 2020-05-20 DIAGNOSIS — D696 Thrombocytopenia, unspecified: Secondary | ICD-10-CM | POA: Diagnosis not present

## 2020-05-20 DIAGNOSIS — R7303 Prediabetes: Secondary | ICD-10-CM | POA: Diagnosis present

## 2020-05-20 DIAGNOSIS — Z800 Family history of malignant neoplasm of digestive organs: Secondary | ICD-10-CM

## 2020-05-20 DIAGNOSIS — N179 Acute kidney failure, unspecified: Secondary | ICD-10-CM | POA: Diagnosis not present

## 2020-05-20 DIAGNOSIS — E875 Hyperkalemia: Secondary | ICD-10-CM | POA: Diagnosis not present

## 2020-05-20 DIAGNOSIS — Z452 Encounter for adjustment and management of vascular access device: Secondary | ICD-10-CM

## 2020-05-20 DIAGNOSIS — J9602 Acute respiratory failure with hypercapnia: Secondary | ICD-10-CM | POA: Diagnosis not present

## 2020-05-20 DIAGNOSIS — J90 Pleural effusion, not elsewhere classified: Secondary | ICD-10-CM | POA: Diagnosis not present

## 2020-05-20 DIAGNOSIS — I471 Supraventricular tachycardia: Secondary | ICD-10-CM | POA: Diagnosis not present

## 2020-05-20 DIAGNOSIS — U071 COVID-19: Principal | ICD-10-CM | POA: Diagnosis present

## 2020-05-20 DIAGNOSIS — F329 Major depressive disorder, single episode, unspecified: Secondary | ICD-10-CM | POA: Diagnosis present

## 2020-05-20 DIAGNOSIS — C9110 Chronic lymphocytic leukemia of B-cell type not having achieved remission: Secondary | ICD-10-CM | POA: Diagnosis not present

## 2020-05-20 DIAGNOSIS — I110 Hypertensive heart disease with heart failure: Secondary | ICD-10-CM | POA: Diagnosis present

## 2020-05-20 DIAGNOSIS — D630 Anemia in neoplastic disease: Secondary | ICD-10-CM | POA: Diagnosis present

## 2020-05-20 DIAGNOSIS — I480 Paroxysmal atrial fibrillation: Secondary | ICD-10-CM

## 2020-05-20 DIAGNOSIS — I4819 Other persistent atrial fibrillation: Secondary | ICD-10-CM | POA: Diagnosis present

## 2020-05-20 DIAGNOSIS — Z8249 Family history of ischemic heart disease and other diseases of the circulatory system: Secondary | ICD-10-CM

## 2020-05-20 DIAGNOSIS — E785 Hyperlipidemia, unspecified: Secondary | ICD-10-CM | POA: Diagnosis present

## 2020-05-20 DIAGNOSIS — F410 Panic disorder [episodic paroxysmal anxiety] without agoraphobia: Secondary | ICD-10-CM | POA: Diagnosis present

## 2020-05-20 DIAGNOSIS — R001 Bradycardia, unspecified: Secondary | ICD-10-CM | POA: Diagnosis not present

## 2020-05-20 DIAGNOSIS — R339 Retention of urine, unspecified: Secondary | ICD-10-CM | POA: Diagnosis not present

## 2020-05-20 DIAGNOSIS — E669 Obesity, unspecified: Secondary | ICD-10-CM | POA: Diagnosis present

## 2020-05-20 DIAGNOSIS — J982 Interstitial emphysema: Secondary | ICD-10-CM

## 2020-05-20 DIAGNOSIS — R7401 Elevation of levels of liver transaminase levels: Secondary | ICD-10-CM | POA: Diagnosis present

## 2020-05-20 DIAGNOSIS — B3781 Candidal esophagitis: Secondary | ICD-10-CM | POA: Diagnosis present

## 2020-05-20 LAB — COMPREHENSIVE METABOLIC PANEL
ALT: 59 U/L — ABNORMAL HIGH (ref 0–44)
AST: 54 U/L — ABNORMAL HIGH (ref 15–41)
Albumin: 2.6 g/dL — ABNORMAL LOW (ref 3.5–5.0)
Alkaline Phosphatase: 46 U/L (ref 38–126)
Anion gap: 11 (ref 5–15)
BUN: 15 mg/dL (ref 6–20)
CO2: 22 mmol/L (ref 22–32)
Calcium: 8.2 mg/dL — ABNORMAL LOW (ref 8.9–10.3)
Chloride: 106 mmol/L (ref 98–111)
Creatinine, Ser: 0.75 mg/dL (ref 0.44–1.00)
GFR calc Af Amer: >60 mL/min (ref >60)
GFR calc non Af Amer: >60 mL/min (ref >60)
Glucose, Bld: 129 mg/dL — ABNORMAL HIGH (ref 70–99)
Potassium: 3.8 mmol/L (ref 3.5–5.1)
Sodium: 139 mmol/L (ref 135–145)
Total Bilirubin: 0.9 mg/dL (ref 0.3–1.2)
Total Protein: 6.3 g/dL — ABNORMAL LOW (ref 6.5–8.1)

## 2020-05-20 LAB — CBC WITH DIFFERENTIAL/PLATELET
Abs Immature Granulocytes: 0.3 10*3/uL — ABNORMAL HIGH (ref 0.00–0.07)
Basophils Absolute: 0.1 10*3/uL (ref 0.0–0.1)
Basophils Relative: 1 %
Eosinophils Absolute: 0 10*3/uL (ref 0.0–0.5)
Eosinophils Relative: 0 %
HCT: 35.2 % — ABNORMAL LOW (ref 36.0–46.0)
Hemoglobin: 11.5 g/dL — ABNORMAL LOW (ref 12.0–15.0)
Immature Granulocytes: 3 %
Lymphocytes Relative: 43 %
Lymphs Abs: 4.8 10*3/uL — ABNORMAL HIGH (ref 0.7–4.0)
MCH: 28.4 pg (ref 26.0–34.0)
MCHC: 32.7 g/dL (ref 30.0–36.0)
MCV: 86.9 fL (ref 80.0–100.0)
Monocytes Absolute: 3.5 10*3/uL — ABNORMAL HIGH (ref 0.1–1.0)
Monocytes Relative: 33 %
Neutro Abs: 2.1 10*3/uL (ref 1.7–7.7)
Neutrophils Relative %: 20 %
Platelets: 237 10*3/uL (ref 150–400)
RBC: 4.05 MIL/uL (ref 3.87–5.11)
RDW: 14.9 % (ref 11.5–15.5)
WBC Morphology: ABNORMAL
WBC: 10.8 10*3/uL — ABNORMAL HIGH (ref 4.0–10.5)
nRBC: 0.2 % (ref 0.0–0.2)

## 2020-05-20 LAB — LACTATE DEHYDROGENASE: LDH: 506 U/L — ABNORMAL HIGH (ref 98–192)

## 2020-05-20 LAB — STREP PNEUMONIAE URINARY ANTIGEN: Strep Pneumo Urinary Antigen: NEGATIVE

## 2020-05-20 LAB — LACTIC ACID, PLASMA
Lactic Acid, Venous: 2 mmol/L (ref 0.5–1.9)
Lactic Acid, Venous: 1 mmol/L (ref 0.5–1.9)

## 2020-05-20 LAB — TROPONIN I (HIGH SENSITIVITY)
Troponin I (High Sensitivity): 22 ng/L — ABNORMAL HIGH (ref <18)
Troponin I (High Sensitivity): 20 ng/L — ABNORMAL HIGH (ref <18)

## 2020-05-20 LAB — PROTIME-INR
INR: 1.7 — ABNORMAL HIGH (ref 0.8–1.2)
Prothrombin Time: 19.7 seconds — ABNORMAL HIGH (ref 11.4–15.2)

## 2020-05-20 LAB — D-DIMER, QUANTITATIVE: D-Dimer, Quant: 1.88 ug/mL-FEU — ABNORMAL HIGH (ref 0.00–0.50)

## 2020-05-20 LAB — C-REACTIVE PROTEIN: CRP: 18.3 mg/dL — ABNORMAL HIGH (ref <1.0)

## 2020-05-20 LAB — FERRITIN: Ferritin: 3361 ng/mL — ABNORMAL HIGH (ref 11–307)

## 2020-05-20 LAB — TRIGLYCERIDES: Triglycerides: 139 mg/dL (ref <150)

## 2020-05-20 LAB — PROCALCITONIN: Procalcitonin: 0.41 ng/mL

## 2020-05-20 LAB — HCG, QUANTITATIVE, PREGNANCY: hCG, Beta Chain, Quant, S: 2 m[IU]/mL (ref <5)

## 2020-05-20 LAB — FIBRINOGEN: Fibrinogen: 1052 mg/dL — ABNORMAL HIGH (ref 210–475)

## 2020-05-20 MED ORDER — FLUCONAZOLE 150 MG PO TABS
150.0000 mg | ORAL_TABLET | Freq: Every day | ORAL | Status: AC
  Administered 2020-05-21 – 2020-05-26 (×6): 150 mg via ORAL
  Filled 2020-05-20 (×7): qty 1

## 2020-05-20 MED ORDER — SODIUM CHLORIDE 0.9% FLUSH: 3.0000 mL | Freq: Two times a day (BID) | INTRAVENOUS | Status: DC

## 2020-05-20 MED ORDER — PANTOPRAZOLE SODIUM 40 MG PO TBEC
40.0000 mg | DELAYED_RELEASE_TABLET | Freq: Two times a day (BID) | ORAL | Status: DC
  Administered 2020-05-21 – 2020-06-03 (×28): 40 mg via ORAL
  Filled 2020-05-20 (×29): qty 1

## 2020-05-20 MED ORDER — ONDANSETRON HCL 4 MG/2ML IJ SOLN: 4.0000 mg | Freq: Four times a day (QID) | INTRAMUSCULAR | Status: DC | PRN

## 2020-05-20 MED ORDER — SODIUM CHLORIDE 0.9 % IV SOLN
2.0000 g | Freq: Once | INTRAVENOUS | Status: AC
  Administered 2020-05-20: 2 g via INTRAVENOUS
  Filled 2020-05-20: qty 2

## 2020-05-20 MED ORDER — ENOXAPARIN SODIUM 40 MG/0.4ML ~~LOC~~ SOLN: 40.0000 mg | SUBCUTANEOUS | Status: DC

## 2020-05-20 MED ORDER — METOPROLOL TARTRATE 5 MG/5ML IV SOLN: 5.0000 mg | Freq: Four times a day (QID) | INTRAVENOUS | Status: DC | PRN

## 2020-05-20 MED ORDER — SODIUM CHLORIDE 0.9 % IV SOLN
200.0000 mg | Freq: Once | INTRAVENOUS | Status: AC
  Administered 2020-05-20: 200 mg via INTRAVENOUS
  Filled 2020-05-20: qty 200

## 2020-05-20 MED ORDER — HYDRALAZINE HCL 20 MG/ML IJ SOLN
10.0000 mg | Freq: Four times a day (QID) | INTRAMUSCULAR | Status: DC | PRN
  Administered 2020-05-21: 10 mg via INTRAVENOUS
  Filled 2020-05-20: qty 1

## 2020-05-20 MED ORDER — ACETAMINOPHEN 325 MG PO TABS
650.0000 mg | ORAL_TABLET | Freq: Four times a day (QID) | ORAL | Status: DC | PRN
  Administered 2020-05-21 – 2020-05-27 (×2): 650 mg via ORAL
  Filled 2020-05-20 (×2): qty 2

## 2020-05-20 MED ORDER — ONDANSETRON HCL 4 MG PO TABS: 4.0000 mg | ORAL_TABLET | Freq: Four times a day (QID) | ORAL | Status: DC | PRN

## 2020-05-20 MED ORDER — SODIUM CHLORIDE 0.9 % IV SOLN
100.0000 mg | Freq: Every day | INTRAVENOUS | Status: AC
  Administered 2020-05-21 – 2020-05-24 (×4): 100 mg via INTRAVENOUS
  Filled 2020-05-20 (×4): qty 20

## 2020-05-20 MED ORDER — SODIUM CHLORIDE 0.9% FLUSH: 3.0000 mL | INTRAVENOUS | Status: DC | PRN

## 2020-05-20 MED ORDER — SODIUM CHLORIDE 0.9 % IV SOLN
500.0000 mg | INTRAVENOUS | Status: AC
  Administered 2020-05-20 – 2020-05-24 (×5): 500 mg via INTRAVENOUS
  Filled 2020-05-20 (×5): qty 500

## 2020-05-20 MED ORDER — VANCOMYCIN HCL 1500 MG/300ML IV SOLN
1500.0000 mg | Freq: Once | INTRAVENOUS | Status: AC
  Administered 2020-05-20: 1500 mg via INTRAVENOUS
  Filled 2020-05-20: qty 300

## 2020-05-20 MED ORDER — SODIUM CHLORIDE 0.9 % IV SOLN
250.0000 mL | INTRAVENOUS | Status: DC | PRN
  Administered 2020-06-06: 250 mL via INTRAVENOUS
  Administered 2020-06-09: 10 mL via INTRAVENOUS

## 2020-05-20 MED ORDER — METOPROLOL TARTRATE 5 MG/5ML IV SOLN
5.0000 mg | Freq: Once | INTRAVENOUS | Status: AC
  Administered 2020-05-20: 5 mg via INTRAVENOUS
  Filled 2020-05-20: qty 5

## 2020-05-20 MED ORDER — VANCOMYCIN HCL IN DEXTROSE 1-5 GM/200ML-% IV SOLN
1000.0000 mg | Freq: Three times a day (TID) | INTRAVENOUS | Status: DC
  Administered 2020-05-20 – 2020-05-21 (×2): 1000 mg via INTRAVENOUS
  Filled 2020-05-20 (×2): qty 200

## 2020-05-20 MED ORDER — DEXAMETHASONE SODIUM PHOSPHATE 10 MG/ML IJ SOLN
10.0000 mg | Freq: Once | INTRAMUSCULAR | Status: AC
  Administered 2020-05-20: 10 mg via INTRAVENOUS
  Filled 2020-05-20: qty 1

## 2020-05-20 MED ORDER — VALGANCICLOVIR HCL 450 MG PO TABS
900.0000 mg | ORAL_TABLET | Freq: Every day | ORAL | Status: DC
  Administered 2020-05-21 – 2020-06-12 (×22): 900 mg via ORAL
  Filled 2020-05-20 (×24): qty 2

## 2020-05-20 MED ORDER — POLYETHYLENE GLYCOL 3350 17 G PO PACK
17.0000 g | PACK | Freq: Every day | ORAL | Status: DC | PRN
  Administered 2020-05-30: 17 g via ORAL
  Filled 2020-05-20 (×2): qty 1

## 2020-05-20 MED ORDER — APIXABAN 5 MG PO TABS
5.0000 mg | ORAL_TABLET | Freq: Two times a day (BID) | ORAL | Status: DC
  Administered 2020-05-21 – 2020-05-23 (×5): 5 mg via ORAL
  Filled 2020-05-20 (×6): qty 1

## 2020-05-20 MED ORDER — METOPROLOL SUCCINATE ER 25 MG PO TB24
150.0000 mg | ORAL_TABLET | Freq: Every day | ORAL | Status: DC
  Administered 2020-05-21 – 2020-05-29 (×9): 150 mg via ORAL
  Filled 2020-05-20 (×7): qty 6
  Filled 2020-05-20: qty 3
  Filled 2020-05-20 (×2): qty 6

## 2020-05-20 MED ORDER — SODIUM CHLORIDE 0.9 % IV SOLN
2.0000 g | INTRAVENOUS | Status: DC
  Administered 2020-05-20: 2 g via INTRAVENOUS
  Filled 2020-05-20: qty 20

## 2020-05-20 MED ORDER — SACUBITRIL-VALSARTAN 49-51 MG PO TABS
1.0000 | ORAL_TABLET | Freq: Two times a day (BID) | ORAL | Status: DC
  Administered 2020-05-21 – 2020-06-03 (×28): 1 via ORAL
  Filled 2020-05-20 (×30): qty 1

## 2020-05-20 NOTE — H&P (Signed)
History and Physical  Audrey Gross DSK:[id number] DOB: 1961-08-19 DOA: 06/04/2020  PCP: Janith Lima, MD Patient coming from: Home  I have personally briefly reviewed patient's old medical records in [place]   Chief Complaint: sob  HPI: Audrey Gross is a 59 y.o. 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 with her last treatment about 2 weeks prior to admission, paroxysmal atrial fibrillation on Eliquis, obesity history of nonischemic cardiomyopathy with an EF of 35% back in 2019 repeated 2D echo that showed an improvement in ejection fraction to 50%, MR, she has not been vaccinated recently discharged from the hospital on 05/12/2020 and treated for COVID-19 infection she went home off oxygen comes in again for shortness of breath that started about 5 days prior to admission, she relates being short of breath just with getting dressed this morning, positive for fever, taking Tylenol at home, with an ongoing productive cough.  In the ED: Currently hypoxic satting 76% placed on 15 L nonrebreather and remain less than 90% so she had to be placed on a nonrebreather nasal cannula with improvement in her saturations around 90 with a white count of 10.8 lymphocytic predominant, lactic acidosis, imaging showed worsening infiltrates.    Review of Systems: All systems reviewed and apart from history of presenting illness, are negative.  Past Medical History:  Diagnosis Date  . Atrial fibrillation ([place])   . Cystic breast   . Depression   . Diffuse cystic mastopathy   . Dyslipidemia (high LDL; low HDL)   . Hypertension   . Iron deficiency anemia, unspecified   . Lymphadenopathy of head and neck 04/05/2015  . Medical non-compliance   . Mitral regurgitation 02/2017   moderate to severe  . Persistent atrial fibrillation with rapid ventricular response ([place]) 07/15/2017   Past Surgical History:  Procedure Laterality Date  . BIOPSY  10/17/2019   Procedure: BIOPSY;  Surgeon:  Jerene Bears, MD;  Location: [facility];  Service: Endoscopy;;  . CARDIOVERSION N/A 04/02/2017   Procedure: CARDIOVERSION;  Surgeon: Dorothy Spark, MD;  Location: [facility] ENDOSCOPY;  Service: Cardiovascular;  Laterality: N/A;  . CARDIOVERSION N/A 08/19/2017   Procedure: CARDIOVERSION;  Surgeon: Larey Dresser, MD;  Location: [facility] ENDOSCOPY;  Service: Cardiovascular;  Laterality: N/A;  . ESOPHAGOGASTRODUODENOSCOPY (EGD) WITH PROPOFOL N/A 10/17/2019   Procedure: ESOPHAGOGASTRODUODENOSCOPY (EGD) WITH PROPOFOL;  Surgeon: Jerene Bears, MD;  Location: [facility] ENDOSCOPY;  Service: Endoscopy;  Laterality: N/A;  . IR IMAGING GUIDED PORT INSERTION  06/29/2018  . TUBAL LIGATION     Social History:  reports that she has never smoked. She has never used smokeless tobacco. She reports that she does not drink alcohol and does not use drugs.   No Known Allergies  Family History  Problem Relation Age of Onset  . Sudden death Mother   . Kidney disease Father        H/O HD and kidney transplant  . Stomach cancer Maternal Aunt   . Stomach cancer Paternal Grandmother   . Heart disease Brother   . Heart disease Maternal Grandmother     Prior to Admission medications   Medication Sig Start Date End Date Taking? Authorizing Provider  Cholecalciferol (VITAMIN D-3) 1000 units CAPS Take 1,000 Units by mouth daily with breakfast.    [provider]  ELIQUIS 5 MG TABS tablet TAKE 1 TABLET BY MOUTH TWICE A DAY Patient taking differently: Take 5 mg by mouth 2 (two) times daily.  04/30/20  Camnitz, Will Hassell Done, MD  fluconazole (DIFLUCAN) 150 MG tablet Take 1 tablet (150 mg total) by mouth daily. 05/13/20   Patrecia Pour, MD  lenalidomide (REVLIMID) 10 MG capsule Take 1 capsule (10 mg total) by mouth daily. hold this medication until oncology follow up 05/12/20   Patrecia Pour, MD  lidocaine-prilocaine (EMLA) cream Apply 1 application topically as needed. Patient taking differently: Apply 1 application topically  as needed (acess port).  06/04/18   Heath Lark, MD  metoprolol succinate (TOPROL-XL) 100 MG 24 hr tablet TAKE 1 AND 1/2 TABLETS BY MOUTH EVERY DAY. TAKE WITH FOOD OR RIGHT AFTER A MEAL Patient taking differently: Take 150 mg by mouth daily.  08/05/19   Camnitz, Will Hassell Done, MD  ondansetron (ZOFRAN-ODT) 8 MG disintegrating tablet Take 1 tablet (8 mg total) by mouth every 8 (eight) hours as needed for nausea or vomiting. 10/07/19   Jaynee Eagles, PA-C  pantoprazole (PROTONIX) 40 MG tablet TAKE 1 TABLET BY MOUTH TWICE A DAY Patient taking differently: Take 40 mg by mouth 2 (two) times daily.  01/12/20   Heath Lark, MD  sacubitril-valsartan (ENTRESTO) 49-51 MG Take 1 tablet by mouth 2 (two) times daily. 03/26/20   Camnitz, Ocie Doyne, MD  sulfamethoxazole-trimethoprim (BACTRIM) 400-80 MG tablet TAKE 1 TABLET BY MOUTH DAILY. 03/28/20   Heath Lark, MD  valGANciclovir (VALCYTE) 450 MG tablet TAKE 2 TABLETS (900 MG TOTAL) BY MOUTH DAILY. 03/28/20   Heath Lark, MD   Physical Exam: Vitals:   05/08/2020 0944 06/03/2020 1003 06/03/2020 1051 05/06/2020 1052  BP: (!) 142/89  (!) 165/100   Pulse: 100  87   Resp: (!) 28  (!) 27   Temp: 98.2 F (36.8 C)  98.1 F (36.7 C)   TempSrc: Oral  Oral   SpO2: (S) (!) 79%  (!) 87%   Weight:  88 kg  88.5 kg  Height:  5\' 7"  (1.702 m)  5\' 7"  (1.702 m)     General exam: Moderately built and nourished patient, lying comfortably supine on the gurney in no obvious distress.  Head, eyes and ENT: Nontraumatic and normocephalic. Pupils equally reacting to light and accommodation. Oral mucosa moist.  Neck: Supple. No JVD, carotid bruit or thyromegaly.  Lymphatics: No lymphadenopathy.  Respiratory system: Good air movement with diffuse crackles bilaterally.  Cardiovascular system: S1 and S2 heard, RRR. No JVD, murmurs, gallops, clicks or pedal edema.  Gastrointestinal system: Abdomen is nondistended, soft and nontender. Normal bowel sounds heard. No organomegaly or masses  appreciated.  Extremities: Symmetric 5 x 5 power. Peripheral pulses symmetrically felt.   Skin: No rashes or acute findings.  Musculoskeletal system: Negative exam.  Psychiatry: Pleasant and cooperative.   Labs on Admission:  Basic Metabolic Panel: Recent Labs  Lab 06/03/2020 1014  NA 139  K 3.8  CL 106  CO2 22  GLUCOSE 129*  BUN 15  CREATININE 0.75  CALCIUM 8.2*   Liver Function Tests: Recent Labs  Lab 05/27/2020 1014  AST 54*  ALT 59*  ALKPHOS 46  BILITOT 0.9  PROT 6.3*  ALBUMIN 2.6*   No results for input(s): LIPASE, AMYLASE in the last 168 hours. No results for input(s): AMMONIA in the last 168 hours. CBC: Recent Labs  Lab 05/30/2020 1014  WBC 10.8*  NEUTROABS 2.1  HGB 11.5*  HCT 35.2*  MCV 86.9  PLT 237   Cardiac Enzymes: No results for input(s): CKTOTAL, CKMB, CKMBINDEX, TROPONINI in the last 168 hours.  BNP (last 3 results) No  results for input(s): PROBNP in the last 8760 hours. CBG: No results for input(s): GLUCAP in the last 168 hours.  Radiological Exams on Admission: DG Chest Portable 1 View  Result Date: 05/31/2020 CLINICAL DATA:  COVID positive, cough EXAM: PORTABLE CHEST 1 VIEW COMPARISON:  05/11/2020 FINDINGS: Right chest wall port catheter is unchanged. There are increased bilateral opacities primarily involving mid and lower lungs. No significant pleural effusion. Normal heart size. IMPRESSION: Progression of pneumonia since 05/11/2020. Electronically Signed   By: Macy Mis M.D.   On: 05/09/2020 11:22    EKG: Independently reviewed.  Sinus rhythm heart rate 95 100, normal axis LVH T wave inversions in the inferior lateral leads  Assessment/Plan Acute respiratory failure with hypoxia secondary to pneumonia due to COVID-19: She was not discharged on oxygen at home she was, she was discharged on 5 additional days of steroids.  She has completed a 10-day course of steroids. We will start her on IV remdesivir check a procalcitonin place  her in stepdown and she will require high flow nasal cannula. Culture data has been sent by the ED. Monitor strict I's and O's and daily weights out of bed to chair prone every 16 hours a day.  If not prone out of bed to chair. I have explained to her that she has a very poor prognosis and she seems to understand she would like to continue to be full code. We will go ahead and check a procalcitonin, and will start her empirically on IV vancomycin and cefepime if procalcitonin is low yield antibiotics can be discontinued. She is not septic at the time of admission.  Paroxysmal atrial fibrillation: Currently in sinus rhythm she relates she feels palpitations we will continue her metoprolol and Eliquis. We will give her metoprolol IV 5 mg as needed for heart rate greater than 100.  CLL (chronic lymphocytic leukemia) ([place]) Noted will have to hold chemotherapy.  Nonischemic cardiomyopathy (H[facility])/  Chronic systolic heart failure ([place]) Cannot find a reason is probably medication induced either way we will continue metoprolol and Entresto. We will try to keep her blood pressure on the low side, use hydralazine IV as needed  Transaminitis: Likely due to COVID-19, currently on IV remdesivir.   DVT Prophylaxis: lovenxo Code Status: full  Family Communication: none  Disposition Plan: inpatient/SDU       It is my clinical opinion that admission to INPATIENT is reasonable and necessary in this 59 y.o. female past medical history of CLL she received chemotherapy about 2 weeks ago nonischemic cardiomyopathy, recently discharged from the hospital for COVID-19 pneumonia without oxygen she comes in with high oxygen requirements requiring 15 L of high flow nasal cannula plus nonrebreather and still satting 90%, will admitted to the stepdown unit started on IV remdesivir short course of steroids, will check a procalcitonin, start empirically on antibiotics  Given the aforementioned, the predictability of  an adverse outcome is felt to be significant. I expect that the patient will require at least 2 midnights in the hospital to treat this condition.  Charlynne Cousins MD [facility]   05/27/2020, 11:51 AM

## 2020-05-20 NOTE — ED Notes (Signed)
Purewick placed on pt at 50 mmHg.

## 2020-05-20 NOTE — ED Notes (Signed)
Attempted to medicate and provide oral intake for this pt, but pt desaturated to low 80s when RN removed NRB. NRB was placed back on pt and pts o2 is now in low 90s.

## 2020-05-20 NOTE — ED Provider Notes (Signed)
[facility] DEPT Provider Note   CSN: [id number] Arrival date & time: 05/29/2020  5701     History Chief Complaint  Patient presents with  . COVID Positive    Jakeisha B Matin is a 59 y.o. female.  HPI  Patient is a 59 year old female with a past medical history pertinent for CLL on chemo but currently pause because of her Covid infection, A. fib on Eliquis with no missed doses, dyslipidemia, HTN, obesity, history of an ICM, MR.  Patient presents to the emergency department for ongoing shortness of breath, weakness, fatigue.  She states that she was admitted to the hospital for 5 days last week and discharged because of improved symptoms states that her symptoms have progressively worsened again.  She states that she feels very short of breath and generalized weakness.  She denies any focal weakness.  States she has not had a high fever that she did have last time.  She is taking Tylenol once daily in the evenings.   Patient states that she has not missed any doses of her Eliquis.      Past Medical History:  Diagnosis Date  . Atrial fibrillation ([place])   . Cystic breast   . Depression   . Diffuse cystic mastopathy   . Dyslipidemia (high LDL; low HDL)   . Hypertension   . Iron deficiency anemia, unspecified   . Lymphadenopathy of head and neck 04/05/2015  . Medical non-compliance   . Mitral regurgitation 02/2017   moderate to severe  . Persistent atrial fibrillation with rapid ventricular response ([place]) 07/15/2017    Patient Active Problem List   Diagnosis Date Noted  . Pneumonia due to COVID-19 virus 05/07/2020  . Physical debility 10/27/2019  . Acute esophagitis   . Gastritis without bleeding   . Duodenal nodule   . Dehydration 10/13/2019  . Dysphagia 10/03/2019  . Close exposure to COVID-19 virus 01/28/2019  . Drug-induced bradycardia 12/10/2018  . Numbness and tingling in left hand 07/07/2018  . Pancytopenia, acquired ([place]) 06/05/2018  .  Drug-induced neutropenia ([place]) 11/25/2017  . PICC (peripherally inserted central catheter) flush 10/30/2017  . PICC (peripherally inserted central catheter) in place 10/30/2017  . Closed wedge compression fracture of first lumbar vertebra ([place]) 10/25/2017  . Acute midline low back pain with sciatica 10/22/2017  . Back injury, initial encounter 10/22/2017  . Acute bilateral thoracic back pain 10/22/2017  . Port-A-Cath in place 10/16/2017  . Acute respiratory failure with hypoxia ([place]) 09/22/2017  . Chronic systolic heart failure ([place]) 06/27/2017  . Goals of care, counseling/discussion 05/29/2017  . Nonischemic cardiomyopathy ([place]) 04/07/2017  . Chronic anticoagulation 03/23/2017  . Mitral regurgitation 02/28/2017  . Obesity 08/12/2016  . Hypertriglyceridemia without hypercholesterolemia 07/12/2016  . Screening for cervical cancer 06/11/2016  . PAF (paroxysmal atrial fibrillation) ([place]) 01/01/2016  . Hypokalemia 02/28/2014  . Abnormal mammogram of right breast 02/28/2014  . Dyslipidemia (high LDL; low HDL) 07/22/2012  . Osteopenia 06/21/2012  . Routine general medical examination at a health care facility 06/21/2012  . Essential hypertension 06/21/2012  . CLL (chronic lymphocytic leukemia) ([place]) 01/05/2012  . Iron deficiency anemia 01/05/2012  . Depression 01/05/2012  . 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 BIOPSY  10/17/2019   Procedure: BIOPSY;  Surgeon: Jerene Bears, MD;  Location: [facility];  Service: Endoscopy;;  . CARDIOVERSION N/A 04/02/2017   Procedure: CARDIOVERSION;  Surgeon: Ena Dawley  H, MD;  Location: [facility]COPY;  Service: Cardiovascular;  Laterality: N/A;  . CARDIOVERSION N/A 08/19/2017   Procedure: CARDIOVERSION;  Surgeon: Larey Dresser, MD;  Location: [facility] ENDOSCOPY;  Service: Cardiovascular;  Laterality: N/A;  . ESOPHAGOGASTRODUODENOSCOPY (EGD) WITH  PROPOFOL N/A 10/17/2019   Procedure: ESOPHAGOGASTRODUODENOSCOPY (EGD) WITH PROPOFOL;  Surgeon: Jerene Bears, MD;  Location: [facility] ENDOSCOPY;  Service: Endoscopy;  Laterality: N/A;  . IR IMAGING GUIDED PORT INSERTION  06/29/2018  . TUBAL LIGATION       OB History    Gravida  6   Para  6   Term  6   Preterm      AB      Living  5     SAB      TAB      Ectopic      Multiple      Live Births  5           Family History  Problem Relation Age of Onset  . Sudden death Mother   . Kidney disease Father        H/O HD and kidney transplant  . Stomach cancer Maternal Aunt   . Stomach cancer Paternal Grandmother   . Heart disease Brother   . Heart disease Maternal Grandmother     Social History   Tobacco Use  . Smoking status: Never Smoker  . Smokeless tobacco: Never Used  Vaping Use  . Vaping Use: Never used  Substance Use Topics  . Alcohol use: No  . Drug use: No    Home Medications Prior to Admission medications   Medication Sig Start Date End Date Taking? Authorizing Provider  acetaminophen (TYLENOL) 325 MG tablet Take 325-650 mg by mouth every 6 (six) hours as needed for mild pain, moderate pain or fever.    Yes [provider]  Cholecalciferol (VITAMIN D-3) 1000 units CAPS Take 1,000 Units by mouth daily with breakfast.   Yes [provider]  ELIQUIS 5 MG TABS tablet TAKE 1 TABLET BY MOUTH TWICE A DAY Patient taking differently: Take 5 mg by mouth 2 (two) times daily.  04/30/20  Yes Camnitz, Will Hassell Done, MD  fluconazole (DIFLUCAN) 150 MG tablet Take 1 tablet (150 mg total) by mouth daily. 05/13/20  Yes Patrecia Pour, MD  lidocaine-prilocaine (EMLA) cream Apply 1 application topically as needed. Patient taking differently: Apply 1 application topically daily as needed (access port).  06/04/18  Yes Gorsuch, Ni, MD  metoprolol succinate (TOPROL-XL) 100 MG 24 hr tablet TAKE 1 AND 1/2 TABLETS BY MOUTH EVERY DAY. TAKE WITH FOOD OR RIGHT AFTER A  MEAL Patient taking differently: Take 150 mg by mouth daily.  08/05/19  Yes Camnitz, Will Hassell Done, MD  ondansetron (ZOFRAN-ODT) 8 MG disintegrating tablet Take 1 tablet (8 mg total) by mouth every 8 (eight) hours as needed for nausea or vomiting. 10/07/19  Yes Jaynee Eagles, PA-C  pantoprazole (PROTONIX) 40 MG tablet TAKE 1 TABLET BY MOUTH TWICE A DAY Patient taking differently: Take 40 mg by mouth 2 (two) times daily.  01/12/20  Yes Gorsuch, Ni, MD  sacubitril-valsartan (ENTRESTO) 49-51 MG Take 1 tablet by mouth 2 (two) times daily. 03/26/20  Yes Camnitz, Will Hassell Done, MD  sulfamethoxazole-trimethoprim (BACTRIM) 400-80 MG tablet TAKE 1 TABLET BY MOUTH DAILY. 03/28/20  Yes Gorsuch, Ni, MD  valGANciclovir (VALCYTE) 450 MG tablet TAKE 2 TABLETS (900 MG TOTAL) BY MOUTH DAILY. 03/28/20  Yes Gorsuch, Ni, MD  lenalidomide (REVLIMID) 10 MG capsule Take 1  capsule (10 mg total) by mouth daily. hold this medication until oncology follow up Patient not taking: Reported on 05/13/2020 05/12/20   Patrecia Pour, MD    Allergies    Patient has no known allergies.  Review of Systems   Review of Systems  Constitutional: Positive for activity change, appetite change, chills, fatigue and fever.  HENT: Positive for congestion.   Eyes: Negative for pain.  Respiratory: Positive for cough and shortness of breath.   Cardiovascular: Positive for chest pain. Negative for leg swelling.  Gastrointestinal: Negative for abdominal pain and vomiting.  Genitourinary: Negative for dysuria.  Musculoskeletal: Negative for myalgias.  Skin: Negative for rash.  Neurological: Negative for dizziness and headaches.    Physical Exam Updated Vital Signs BP (!) 160/129 (BP Location: Right Arm)   Pulse (!) 59   Temp (!) 97.4 F (36.3 C) (Oral)   Resp (!) 27   Ht 5\' 7"  (1.702 m)   Wt 88.5 kg   LMP 02/03/2017 (Approximate)   SpO2 98%   BMI 30.54 kg/m   Physical Exam Vitals and nursing note reviewed.  Constitutional:      General:  She is not in acute distress.    Appearance: She is obese. She is ill-appearing.     Comments: Patient is acutely tachypneic, increased work of breathing.  Otherwise pleasant 59 year old female in no significant pain.  Laying in bed with oxygen of in place via NRB  HENT:     Head: Normocephalic and atraumatic.     Nose: Nose normal.     Mouth/Throat:     Mouth: Mucous membranes are moist.  Eyes:     General: No scleral icterus. Cardiovascular:     Rate and Rhythm: Normal rate and regular rhythm.     Pulses: Normal pulses.     Heart sounds: Normal heart sounds.  Pulmonary:     Effort: Respiratory distress present.     Breath sounds: No wheezing.     Comments: Diffuse crackles worse in bilateral bases.  Increased work of breathing, tachypneic and hypoxic.  On 15 L NRB Abdominal:     Palpations: Abdomen is soft.     Tenderness: There is no abdominal tenderness. There is no guarding or rebound.  Musculoskeletal:     Cervical back: Normal range of motion.     Right lower leg: No edema.     Left lower leg: No edema.  Skin:    General: Skin is warm and dry.     Capillary Refill: Capillary refill takes less than 2 seconds.  Neurological:     Mental Status: She is alert. Mental status is at baseline.  Psychiatric:        Mood and Affect: Mood normal.        Behavior: Behavior normal.     ED Results / Procedures / Treatments   Labs (all labs ordered are listed, but only abnormal results are displayed) Labs Reviewed  COMPREHENSIVE METABOLIC PANEL - Abnormal; Notable for the following components:      Result Value   Glucose, Bld 129 (*)    Calcium 8.2 (*)    Total Protein 6.3 (*)    Albumin 2.6 (*)    AST 54 (*)    ALT 59 (*)    All other components within normal limits  LACTIC ACID, PLASMA - Abnormal; Notable for the following components:   Lactic Acid, Venous 2.0 (*)    All other components within normal limits  CBC WITH DIFFERENTIAL/PLATELET -  Abnormal; Notable for the  following components:   WBC 10.8 (*)    Hemoglobin 11.5 (*)    HCT 35.2 (*)    Lymphs Abs 4.8 (*)    Monocytes Absolute 3.5 (*)    Abs Immature Granulocytes 0.30 (*)    All other components within normal limits  PROTIME-INR - Abnormal; Notable for the following components:   Prothrombin Time 19.7 (*)    INR 1.7 (*)    All other components within normal limits  D-DIMER, QUANTITATIVE (NOT AT [facility]) - Abnormal; Notable for the following components:   D-Dimer, Quant 1.88 (*)    All other components within normal limits  LACTATE DEHYDROGENASE - Abnormal; Notable for the following components:   LDH 506 (*)    All other components within normal limits  FERRITIN - Abnormal; Notable for the following components:   Ferritin 3,361 (*)    All other components within normal limits  FIBRINOGEN - Abnormal; Notable for the following components:   Fibrinogen 1,052 (*)    All other components within normal limits  C-REACTIVE PROTEIN - Abnormal; Notable for the following components:   CRP 18.3 (*)    All other components within normal limits  CBC WITH DIFFERENTIAL/PLATELET - Abnormal; Notable for the following components:   Hemoglobin 11.3 (*)    HCT 35.2 (*)    Lymphs Abs 4.1 (*)    Monocytes Absolute 2.9 (*)    Abs Immature Granulocytes 0.22 (*)    All other components within normal limits  COMPREHENSIVE METABOLIC PANEL - Abnormal; Notable for the following components:   Potassium 3.4 (*)    CO2 21 (*)    Glucose, Bld 155 (*)    Calcium 8.6 (*)    Total Protein 6.2 (*)    Albumin 2.7 (*)    ALT 48 (*)    All other components within normal limits  C-REACTIVE PROTEIN - Abnormal; Notable for the following components:   CRP 25.8 (*)    All other components within normal limits  D-DIMER, QUANTITATIVE (NOT AT [facility]) - Abnormal; Notable for the following components:   D-Dimer, Quant 1.81 (*)    All other components within normal limits  GLUCOSE, CAPILLARY - Abnormal; Notable for the following  components:   Glucose-Capillary 146 (*)    All other components within normal limits  GLUCOSE, CAPILLARY - Abnormal; Notable for the following components:   Glucose-Capillary 141 (*)    All other components within normal limits  GLUCOSE, CAPILLARY - Abnormal; Notable for the following components:   Glucose-Capillary 143 (*)    All other components within normal limits  GLUCOSE, CAPILLARY - Abnormal; Notable for the following components:   Glucose-Capillary 269 (*)    All other components within normal limits  TROPONIN I (HIGH SENSITIVITY) - Abnormal; Notable for the following components:   Troponin I (High Sensitivity) 22 (*)    All other components within normal limits  TROPONIN I (HIGH SENSITIVITY) - Abnormal; Notable for the following components:   Troponin I (High Sensitivity) 20 (*)    All other components within normal limits  CULTURE, BLOOD (ROUTINE X 2)  MRSA PCR SCREENING  CULTURE, BLOOD (ROUTINE X 2)  EXPECTORATED SPUTUM ASSESSMENT W REFEX TO RESP CULTURE  PATHOLOGIST SMEAR REVIEW  PROCALCITONIN  TRIGLYCERIDES  HCG, QUANTITATIVE, PREGNANCY  LEGIONELLA PNEUMOPHILA SEROGP 1 UR AG  STREP PNEUMONIAE URINARY ANTIGEN  LACTIC ACID, PLASMA  BRAIN NATRIURETIC PEPTIDE  PROCALCITONIN  URINALYSIS, ROUTINE W REFLEX MICROSCOPIC  HIV ANTIBODY (ROUTINE TESTING  W REFLEX)  TROPONIN I (HIGH SENSITIVITY)  TROPONIN I (HIGH SENSITIVITY)    EKG EKG Interpretation  Date/Time:  Sunday May 20 2020 10:02:38 EDT Ventricular Rate:  92 PR Interval:    QRS Duration: 88 QT Interval:  496 QTC Calculation: 614 R Axis:   -30 Text Interpretation: Sinus rhythm LAE, consider biatrial enlargement Left ventricular hypertrophy Repol abnrm suggests ischemia, inferior leads Anterior ST elevation, probably due to LVH Prolonged QT interval When compared with ECG of EARLIER SAME DATE No significant change was found Confirmed by Glick, David ([id number]) on 05/19/2020 11:17:23 PM   Radiology DG Chest  Portable 1 View  Result Date: 05/13/2020 CLINICAL DATA:  COVID positive, cough EXAM: PORTABLE CHEST 1 VIEW COMPARISON:  05/11/2020 FINDINGS: Right chest wall port catheter is unchanged. There are increased bilateral opacities primarily involving mid and lower lungs. No significant pleural effusion. Normal heart size. IMPRESSION: Progression of pneumonia since 05/11/2020. Electronically Signed   By: Praneil  Patel M.D.   On: 05/09/2020 11:22   ECHOCARDIOGRAM COMPLETE  Result Date: 05/21/2020    ECHOCARDIOGRAM REPORT   Patient Name:   Tesslyn B Weekly Date of Exam: 05/21/2020 Medical Rec #:  [id number]     Height:       67.0 in Accession #:    [id number]    Weight:       195.0 lb Date of Birth:  01/19/1961     BSA:          2.001 m Patient Age:    59 years      BP:           160/129 mmHg Patient Gender: F             HR:           58  bpm. Exam Location:  Inpatient Procedure: 2D Echo Indications:    CHF 428  History:        Patient has prior history of Echocardiogram examinations, most                 recent 01/31/2019. Arrythmias:Atrial Fibrillation; Risk                 Factors:Hypertension and Dyslipidemia. Hx of cardioversion.                 covid +.  Sonographer:    Mikki Santee RDCS (AE) Referring Phys: [phone] A CALDWELL POWELL JR  Sonographer Comments: Image acquisition challenging due to respiratory motion. patient on Bi-Pap during exam IMPRESSIONS  1. Left ventricular ejection fraction, by estimation, is 60 to 65%. The left ventricle has normal function. The left ventricle has no regional wall motion abnormalities. There is mild left ventricular hypertrophy. Left ventricular diastolic parameters were normal.  2. Right ventricular systolic function is normal. The right ventricular size is normal.  3. The mitral valve is normal in structure. Trivial mitral valve regurgitation. No evidence of mitral stenosis.  4. The aortic valve is tricuspid. Aortic valve regurgitation is trivial. Mild to moderate aortic  valve sclerosis/calcification is present, without any evidence of aortic stenosis.  5. The inferior vena cava is normal in size with greater than 50% respiratory variability, suggesting right atrial pressure of 3 mmHg. FINDINGS  Left Ventricle: Left ventricular ejection fraction, by estimation, is 60 to 65%. The left ventricle has normal function. The left ventricle has no regional wall motion abnormalities. The left ventricular internal cavity size was normal in size. There is  mild left ventricular hypertrophy. Left  ventricular diastolic parameters were normal. Right Ventricle: The right ventricular size is normal. No increase in right ventricular wall thickness. Right ventricular systolic function is normal. Left Atrium: Left atrial size was normal in size. Right Atrium: Right atrial size was normal in size. Pericardium: There is no evidence of pericardial effusion. Mitral Valve: The mitral valve is normal in structure. There is mild thickening of the mitral valve leaflet(s). There is mild calcification of the mitral valve leaflet(s). Normal mobility of the mitral valve leaflets. Mild mitral annular calcification. Trivial mitral valve regurgitation. No evidence of mitral valve stenosis. Tricuspid Valve: The tricuspid valve is normal in structure. Tricuspid valve regurgitation is trivial. No evidence of tricuspid stenosis. Aortic Valve: The aortic valve is tricuspid. Aortic valve regurgitation is trivial. Mild to moderate aortic valve sclerosis/calcification is present, without any evidence of aortic stenosis. Pulmonic Valve: The pulmonic valve was not well visualized. Pulmonic valve regurgitation is trivial. No evidence of pulmonic stenosis. Aorta: The aortic root is normal in size and structure. Venous: The inferior vena cava is normal in size with greater than 50% respiratory variability, suggesting right atrial pressure of 3 mmHg. IAS/Shunts: No atrial level shunt detected by color flow Doppler.  LEFT  VENTRICLE PLAX 2D LVIDd:         4.60 cm  Diastology LVIDs:         2.90 cm  LV e' medial:   7.07 cm/s LV PW:         1.10 cm  LV E/e' medial: 10.8 LV IVS:        1.10 cm LVOT diam:     2.20 cm LV SV:         63 LV SV Index:   32 LVOT Area:     3.80 cm  RIGHT VENTRICLE RV S prime:     14.50 cm/s TAPSE (M-mode): 2.1 cm LEFT ATRIUM           Index       RIGHT ATRIUM           Index LA diam:      3.10 cm 1.55 cm/m  RA Area:     13.70 cm LA Vol (A4C): 33.7 ml 16.84 ml/m RA Volume:   26.40 ml  13.20 ml/m  AORTIC VALVE LVOT Vmax:   91.70 cm/s LVOT Vmean:  59.100 cm/s LVOT VTI:    0.167 m  AORTA Ao Root diam: 3.00 cm MITRAL VALVE MV Area (PHT): 2.87 cm    SHUNTS MV Decel Time: 264 msec    Systemic VTI:  0.17 m MV E velocity: 76.60 cm/s  Systemic Diam: 2.20 cm MV A velocity: 72.70 cm/s MV E/A ratio:  1.05 Jenkins Rouge MD Electronically signed by Jenkins Rouge MD Signature Date/Time: 05/21/2020/4:01:06 PM    Final    VAS [place] LOWER EXTREMITY VENOUS (DVT)  Result Date: 05/21/2020  Lower Venous DVTStudy Indications: Elevated Ddimer.  Risk Factors: COVID 19 positive. Comparison Study: No prior studies. Performing Technologist: Oliver Hum RVT  Examination Guidelines: A complete evaluation includes B-mode imaging, spectral Doppler, color Doppler, and power Doppler as needed of all accessible portions of each vessel. Bilateral testing is considered an integral part of a complete examination. Limited examinations for reoccurring indications may be performed as noted. The reflux portion of the exam is performed with the patient in reverse Trendelenburg.  +---------+---------------+---------+-----------+----------+--------------+ RIGHT    CompressibilityPhasicitySpontaneityPropertiesThrombus Aging +---------+---------------+---------+-----------+----------+--------------+ CFV      Full  Yes      Yes                                  +---------+---------------+---------+-----------+----------+--------------+ SFJ      Full                                                        +---------+---------------+---------+-----------+----------+--------------+ FV Prox  Full                                                        +---------+---------------+---------+-----------+----------+--------------+ FV Mid   Full                                                        +---------+---------------+---------+-----------+----------+--------------+ FV DistalFull                                                        +---------+---------------+---------+-----------+----------+--------------+ PFV      Full                                                        +---------+---------------+---------+-----------+----------+--------------+ POP      Full           Yes      Yes                                 +---------+---------------+---------+-----------+----------+--------------+ PTV      Full                                                        +---------+---------------+---------+-----------+----------+--------------+ PERO     Full                                                        +---------+---------------+---------+-----------+----------+--------------+   +---------+---------------+---------+-----------+----------+--------------+ LEFT     CompressibilityPhasicitySpontaneityPropertiesThrombus Aging +---------+---------------+---------+-----------+----------+--------------+ CFV      Full           Yes      Yes                                 +---------+---------------+---------+-----------+----------+--------------+ SFJ  Full                                                        +---------+---------------+---------+-----------+----------+--------------+ FV Prox  Full                                                         +---------+---------------+---------+-----------+----------+--------------+ FV Mid   Full                                                        +---------+---------------+---------+-----------+----------+--------------+ FV DistalFull                                                        +---------+---------------+---------+-----------+----------+--------------+ PFV      Full                                                        +---------+---------------+---------+-----------+----------+--------------+ POP      Full           Yes      Yes                                 +---------+---------------+---------+-----------+----------+--------------+ PTV      Full                                                        +---------+---------------+---------+-----------+----------+--------------+ PERO     Full                                                        +---------+---------------+---------+-----------+----------+--------------+     Summary: RIGHT: - There is no evidence of deep vein thrombosis in the lower extremity.  - No cystic structure found in the popliteal fossa.  LEFT: - There is no evidence of deep vein thrombosis in the lower extremity.  - No cystic structure found in the popliteal fossa.  *See table(s) above for measurements and observations. Electronically signed by Monica Martinez MD on 05/21/2020 at 3:41:30 PM.    Final     Procedures .Critical Care Performed by: Tedd Sias, PA Authorized by: Tedd Sias, PA   Critical care provider statement:    Critical care time (minutes):  45   Critical care was necessary to  treat or prevent imminent or life-threatening deterioration of the following conditions:  Respiratory failure   Critical care was time spent personally by me on the following activities:  Discussions with consultants, evaluation of patient's response to treatment, examination of patient, ordering and performing treatments and  interventions, ordering and review of laboratory studies, ordering and review of radiographic studies, pulse oximetry, re-evaluation of patient's condition, obtaining history from patient or surrogate and review of old charts   (including critical care time)  Medications Ordered in ED Medications  apixaban (ELIQUIS) tablet 5 mg (5 mg Oral Given 05/21/20 0950)  fluconazole (DIFLUCAN) tablet 150 mg (150 mg Oral Given 05/21/20 0951)  metoprolol succinate (TOPROL-XL) 24 hr tablet 150 mg (150 mg Oral Given 05/21/20 0950)  pantoprazole (PROTONIX) EC tablet 40 mg (40 mg Oral Given 05/21/20 0949)  sacubitril-valsartan (ENTRESTO) 49-51 mg per tablet (1 tablet Oral Given 05/21/20 0950)  valGANciclovir (VALCYTE) 450 MG tablet TABS 900 mg (900 mg Oral Given 05/21/20 0950)  0.9 %  sodium chloride infusion (has no administration in time range)  remdesivir 200 mg in sodium chloride 0.9% 250 mL IVPB (0 mg Intravenous Stopped 05/29/2020 1614)    Followed by  remdesivir 100 mg in sodium chloride 0.9 % 100 mL IVPB (0 mg Intravenous Stopped 05/21/20 1232)  acetaminophen (TYLENOL) tablet 650 mg (650 mg Oral Given 05/21/20 0404)  polyethylene glycol (MIRALAX / GLYCOLAX) packet 17 g (has no administration in time range)  ondansetron (ZOFRAN) tablet 4 mg (has no administration in time range)    Or  ondansetron (ZOFRAN) injection 4 mg (has no administration in time range)  azithromycin (ZITHROMAX) 500 mg in sodium chloride 0.9 % 250 mL IVPB (0 mg Intravenous Stopped 05/21/20 1435)  guaiFENesin-dextromethorphan (ROBITUSSIN DM) 100-10 MG/5ML syrup 5 mL (5 mLs Oral Given 05/21/20 1538)  sodium chloride flush (NS) 0.9 % injection 10-40 mL (10 mLs Intracatheter Given 05/21/20 0952)  sodium chloride flush (NS) 0.9 % injection 10-40 mL (has no administration in time range)  Chlorhexidine Gluconate Cloth 2 % PADS 6 each (0 each Topical Duplicate 2/42/68 3419)  lip balm (CARMEX) ointment (has no administration in time range)  ceFEPIme  (MAXIPIME) 2 g in sodium chloride 0.9 % 100 mL IVPB (2 g Intravenous New Bag/Given 05/21/20 1538)  methylPREDNISolone sodium succinate (SOLU-MEDROL) 125 mg/2 mL injection 45 mg (45 mg Intravenous Given 05/21/20 0951)  oxyCODONE (Oxy IR/ROXICODONE) immediate release tablet 5 mg (5 mg Oral Given 05/21/20 0949)  diclofenac Sodium (VOLTAREN) 1 % topical gel 2 g (2 g Topical Given 05/21/20 1336)  insulin aspart (novoLOG) injection 0-9 Units (5 Units Subcutaneous Given 05/21/20 1539)  MEDLINE mouth rinse (15 mLs Mouth Rinse Given 05/21/20 1204)  feeding supplement (ENSURE ENLIVE) (ENSURE ENLIVE) liquid 237 mL (237 mLs Oral Given 05/21/20 1214)  sulfamethoxazole-trimethoprim (BACTRIM) 400-80 MG per tablet 1 tablet (1 tablet Oral Given 05/21/20 1335)  albuterol (VENTOLIN HFA) 108 (90 Base) MCG/ACT inhaler 2 puff (has no administration in time range)  Ipratropium-Albuterol (COMBIVENT) respimat 1 puff (1 puff Inhalation Given 05/21/20 1543)  dexamethasone (DECADRON) injection 10 mg (10 mg Intravenous Given 05/17/2020 1210)  metoprolol tartrate (LOPRESSOR) injection 5 mg (5 mg Intravenous Given 06/03/2020 1210)  ceFEPIme (MAXIPIME) 2 g in sodium chloride 0.9 % 100 mL IVPB (0 g Intravenous Stopped 05/30/2020 1350)  vancomycin (VANCOREADY) IVPB 1500 mg/300 mL (0 mg Intravenous Stopped 05/08/2020 1812)  morphine 4 MG/ML injection 3 mg (3 mg Intravenous Given 05/21/20 0404)  ALPRAZolam (XANAX) tablet 0.25 mg (0.25 mg  Oral Given 05/21/20 0551)  potassium chloride SA (KLOR-CON) CR tablet 40 mEq (40 mEq Oral Given 05/21/20 3614)    ED Course  I have reviewed the triage vital signs and the nursing notes.  Pertinent labs & imaging results that were available during my care of the patient were reviewed by me and considered in my medical decision making (see chart for details).  Patient 59 year old female presented today with worsening shortness of breath she was recently hospitalized for COVID-19 had full course of remdesivir and had  Decadron was discharged home on continued steroids but no oxygen.  Has had worsening symptoms.  Fourth and to be hypoxic tachycardic and tachypneic on arrival to emergency department.  She was initially hypoxic down to 79% on room air and was placed on nonrebreather which improved oxygen to low 90s and is now on nonrebreather and nasal cannula with oxygen in the mid 90s.  Her respiratory status is improving significantly however she is very oxygen dependent and when her oxygen is removed for her did bedside to drink water she desats quickly.  Her shortness of breath is a predominant symptom brought her in today.  Chest x-ray shows worsening infiltrates with patchy bilateral opacities.  I agree with radiology read.  CBC with mild leukocytosis no significant anemia.  Her acute phase reactants are abnormal and elevated consistent with COVID-19.  Pertinent normalities on CMP.  Clinical Course as of May 21 1629  Sun May 20, 2020  1109 Discussed with Lattie Haw of respiratory therapy.  Recommends not using heated high flow nasal cannula as this makes patient unable to be transported.  Respiratory to assess at bedside.   [WF]    Clinical Course User Index [WF] Tedd Sias, [place]   I discussed this case with my attending physician who cosigned this note including patient's presenting symptoms, physical exam, and planned diagnostics and interventions. Attending physician stated agreement with plan or made changes to plan which were implemented.   Patient noted to hospitalist Dr. Olevia Bowens for acute hypoxic respiratory failure.  This is secondary to COVID-19.  Per Dr. Olevia Bowens review and history patient has not actually been vaccinated for Covid.  MDM Rules/Calculators/A&P                          MAREA REASNER was evaluated in Emergency Department on 05/21/2020 for the symptoms described in the history of present illness. She was evaluated in the context of the global COVID-19 pandemic, which necessitated  consideration that the patient might be at risk for infection with the SARS-CoV-2 virus that causes COVID-19. Institutional protocols and algorithms that pertain to the evaluation of patients at risk for COVID-19 are in a state of rapid change based on information released by regulatory bodies including the CDC and federal and state organizations. These policies and algorithms were followed during the patient's care in the ED.  Final Clinical Impression(s) / ED Diagnoses Final diagnoses:  [id number]-[id number]  Hypoxia    Rx / DC Orders ED Discharge Orders    None       Tedd Sias, [place] 05/21/20 1634    Charlesetta Shanks, MD 06/06/20 0740

## 2020-05-20 NOTE — ED Notes (Signed)
Date and time results received: 05/11/2020 10:51 AM  (use smartphrase ".now" to insert current time)  Test: Lactic Acid  Critical Value: 2.0  Name of Provider Notified: Johnney Killian, MD   Orders Received? Or Actions Taken?: Orders Received - See Orders for details

## 2020-05-20 NOTE — ED Triage Notes (Signed)
Patient reports to the ER as COVID+. Patient reports she is a cancer patient and is taking oral chemo. Patient was recently hospitalized for same. Patient was reported to be 56% on RA when EMS arrived. Patient is currently on NRB 15L, SPO2 92% at the highest on NRB.

## 2020-05-20 NOTE — ED Notes (Signed)
Pt requested food. RN attempted to reposition and remove NRB to allow pt to eat. pts o2 decreased to mid-80s. RN replaced NRB and told pt she could not eat at this time due to her o2 level. Pt now back on 15L NRB and 15L high flow [place] and o2 sat is 90%. Respiratory aware and states this is ok.

## 2020-05-20 NOTE — Progress Notes (Signed)
Pharmacy Antibiotic Note  Audrey Gross is a 59 y.o. 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  Pharmacy is consulted to start vancomycin for PNA  - 8/15 CXR: Progression of pneumonia since 05/11/2020 - afeb, wbc 10.8, scr 0.75 (crcl~86)   Plan: - vancomycin 1500 mg IV x1, then 1000 mg IV q8h for goal trough 15-20 - Repeat course of Remdesivir 200 mg IV x1, then 100 mg daily x4 days per Dr. Venetia Constable - azithromycin and ceftriaxone per MD  ________________________________________  Height: 5\' 7"  (170.2 cm) Weight: 88.5 kg (195 lb) IBW/kg (Calculated) : 61.6  Temp (24hrs), Avg:98.2 F (36.8 C), Min:98.1 F (36.7 C), Max:98.2 F (36.8 C)  Recent Labs  Lab 06/01/2020 1014  WBC 10.8*  CREATININE 0.75  LATICACIDVEN 2.0*    Estimated Creatinine Clearance: 86.5 mL/min (by C-G formula based on SCr of 0.75 mg/dL).    No Known Allergies  Antimicrobials this admission:  8/2 remdesivir>>8/6 8/15 repeat course of remdesivir per Dr. Venetia Constable 8/15 vanc>>  Microbiology results:  8/2 COVID+ 8/15 BCx x2:  8/15 UCx:   Thank you for allowing pharmacy to be a part of this patient's care.  Lynelle Doctor 05/25/2020 12:25 PM

## 2020-05-21 ENCOUNTER — Inpatient Hospital Stay (HOSPITAL_COMMUNITY): Payer: BC Managed Care – PPO

## 2020-05-21 ENCOUNTER — Telehealth: Payer: BC Managed Care – PPO | Admitting: Family

## 2020-05-21 DIAGNOSIS — I5022 Chronic systolic (congestive) heart failure: Secondary | ICD-10-CM

## 2020-05-21 DIAGNOSIS — R7989 Other specified abnormal findings of blood chemistry: Secondary | ICD-10-CM

## 2020-05-21 LAB — CBC WITH DIFFERENTIAL/PLATELET
Abs Immature Granulocytes: 0.22 10*3/uL — ABNORMAL HIGH (ref 0.00–0.07)
Basophils Absolute: 0.1 10*3/uL (ref 0.0–0.1)
Basophils Relative: 1 %
Eosinophils Absolute: 0 10*3/uL (ref 0.0–0.5)
Eosinophils Relative: 0 %
HCT: 35.2 % — ABNORMAL LOW (ref 36.0–46.0)
Hemoglobin: 11.3 g/dL — ABNORMAL LOW (ref 12.0–15.0)
Immature Granulocytes: 2 %
Lymphocytes Relative: 42 %
Lymphs Abs: 4.1 10*3/uL — ABNORMAL HIGH (ref 0.7–4.0)
MCH: 28.2 pg (ref 26.0–34.0)
MCHC: 32.1 g/dL (ref 30.0–36.0)
MCV: 87.8 fL (ref 80.0–100.0)
Monocytes Absolute: 2.9 10*3/uL — ABNORMAL HIGH (ref 0.1–1.0)
Monocytes Relative: 30 %
Neutro Abs: 2.5 10*3/uL (ref 1.7–7.7)
Neutrophils Relative %: 25 %
Platelets: 213 10*3/uL (ref 150–400)
RBC: 4.01 MIL/uL (ref 3.87–5.11)
RDW: 14.9 % (ref 11.5–15.5)
WBC: 9.6 10*3/uL (ref 4.0–10.5)
nRBC: 0 % (ref 0.0–0.2)

## 2020-05-21 LAB — COMPREHENSIVE METABOLIC PANEL
ALT: 48 U/L — ABNORMAL HIGH (ref 0–44)
AST: 35 U/L (ref 15–41)
Albumin: 2.7 g/dL — ABNORMAL LOW (ref 3.5–5.0)
Alkaline Phosphatase: 43 U/L (ref 38–126)
Anion gap: 15 (ref 5–15)
BUN: 17 mg/dL (ref 6–20)
CO2: 21 mmol/L — ABNORMAL LOW (ref 22–32)
Calcium: 8.6 mg/dL — ABNORMAL LOW (ref 8.9–10.3)
Chloride: 108 mmol/L (ref 98–111)
Creatinine, Ser: 0.62 mg/dL (ref 0.44–1.00)
GFR calc Af Amer: >60 mL/min (ref >60)
GFR calc non Af Amer: >60 mL/min (ref >60)
Glucose, Bld: 155 mg/dL — ABNORMAL HIGH (ref 70–99)
Potassium: 3.4 mmol/L — ABNORMAL LOW (ref 3.5–5.1)
Sodium: 144 mmol/L (ref 135–145)
Total Bilirubin: 1.2 mg/dL (ref 0.3–1.2)
Total Protein: 6.2 g/dL — ABNORMAL LOW (ref 6.5–8.1)

## 2020-05-21 LAB — GLUCOSE, CAPILLARY
Glucose-Capillary: 141 mg/dL — ABNORMAL HIGH (ref 70–99)
Glucose-Capillary: 146 mg/dL — ABNORMAL HIGH (ref 70–99)
Glucose-Capillary: 143 mg/dL — ABNORMAL HIGH (ref 70–99)
Glucose-Capillary: 269 mg/dL — ABNORMAL HIGH (ref 70–99)
Glucose-Capillary: 147 mg/dL — ABNORMAL HIGH (ref 70–99)

## 2020-05-21 LAB — ECHOCARDIOGRAM COMPLETE
Area-P 1/2: 2.87 cm2
Height: 67 in
S' Lateral: 2.9 cm
Weight: 3120 oz

## 2020-05-21 LAB — PROCALCITONIN: Procalcitonin: 0.87 ng/mL

## 2020-05-21 LAB — LEGIONELLA PNEUMOPHILA SEROGP 1 UR AG: L. pneumophila Serogp 1 Ur Ag: NEGATIVE

## 2020-05-21 LAB — TROPONIN I (HIGH SENSITIVITY)
Troponin I (High Sensitivity): 9 ng/L (ref <18)
Troponin I (High Sensitivity): 8 ng/L (ref <18)

## 2020-05-21 LAB — HIV ANTIBODY (ROUTINE TESTING W REFLEX): HIV Screen 4th Generation wRfx: NONREACTIVE

## 2020-05-21 LAB — D-DIMER, QUANTITATIVE: D-Dimer, Quant: 1.81 ug/mL-FEU — ABNORMAL HIGH (ref 0.00–0.50)

## 2020-05-21 LAB — BRAIN NATRIURETIC PEPTIDE: B Natriuretic Peptide: 85.8 pg/mL (ref 0.0–100.0)

## 2020-05-21 LAB — MRSA PCR SCREENING: MRSA by PCR: NEGATIVE

## 2020-05-21 LAB — C-REACTIVE PROTEIN: CRP: 25.8 mg/dL — ABNORMAL HIGH (ref <1.0)

## 2020-05-21 LAB — PATHOLOGIST SMEAR REVIEW

## 2020-05-21 MED ORDER — LIP MEDEX EX OINT
TOPICAL_OINTMENT | CUTANEOUS | Status: DC | PRN
  Filled 2020-05-21: qty 7

## 2020-05-21 MED ORDER — SODIUM CHLORIDE 0.9% FLUSH: 10.0000 mL | INTRAVENOUS | Status: DC | PRN

## 2020-05-21 MED ORDER — ORAL CARE MOUTH RINSE
15.0000 mL | Freq: Two times a day (BID) | OROMUCOSAL | Status: DC
  Administered 2020-05-21 – 2020-06-04 (×26): 15 mL via OROMUCOSAL

## 2020-05-21 MED ORDER — INSULIN ASPART 100 UNIT/ML ~~LOC~~ SOLN
0.0000 [IU] | Freq: Three times a day (TID) | SUBCUTANEOUS | Status: DC
  Administered 2020-05-21: 5 [IU] via SUBCUTANEOUS
  Administered 2020-05-21: 1 [IU] via SUBCUTANEOUS
  Administered 2020-05-22: 3 [IU] via SUBCUTANEOUS
  Administered 2020-05-22: 2 [IU] via SUBCUTANEOUS
  Administered 2020-05-22: 3 [IU] via SUBCUTANEOUS
  Administered 2020-05-23: 1 [IU] via SUBCUTANEOUS
  Administered 2020-05-23: 2 [IU] via SUBCUTANEOUS
  Administered 2020-05-23: 3 [IU] via SUBCUTANEOUS
  Administered 2020-05-24: 1 [IU] via SUBCUTANEOUS
  Administered 2020-05-24: 3 [IU] via SUBCUTANEOUS
  Administered 2020-05-25: 1 [IU] via SUBCUTANEOUS
  Administered 2020-05-25 (×2): 2 [IU] via SUBCUTANEOUS
  Administered 2020-05-26: 1 [IU] via SUBCUTANEOUS
  Administered 2020-05-26 (×2): 2 [IU] via SUBCUTANEOUS
  Administered 2020-05-27: 1 [IU] via SUBCUTANEOUS
  Administered 2020-05-27: 5 [IU] via SUBCUTANEOUS
  Administered 2020-05-27 – 2020-05-29 (×3): 2 [IU] via SUBCUTANEOUS
  Administered 2020-05-29: 1 [IU] via SUBCUTANEOUS
  Administered 2020-05-29 – 2020-05-30 (×2): 2 [IU] via SUBCUTANEOUS
  Administered 2020-05-30: 1 [IU] via SUBCUTANEOUS
  Administered 2020-05-31: 3 [IU] via SUBCUTANEOUS
  Administered 2020-05-31 – 2020-06-03 (×4): 2 [IU] via SUBCUTANEOUS
  Administered 2020-06-04: 3 [IU] via SUBCUTANEOUS
  Administered 2020-06-05 – 2020-06-06 (×6): 2 [IU] via SUBCUTANEOUS
  Administered 2020-06-07 – 2020-06-08 (×3): 1 [IU] via SUBCUTANEOUS
  Administered 2020-06-08 – 2020-06-10 (×5): 2 [IU] via SUBCUTANEOUS
  Administered 2020-06-10: 3 [IU] via SUBCUTANEOUS
  Administered 2020-06-10 – 2020-06-11 (×2): 2 [IU] via SUBCUTANEOUS
  Administered 2020-06-11 – 2020-06-12 (×2): 1 [IU] via SUBCUTANEOUS
  Administered 2020-06-12 – 2020-06-13 (×2): 2 [IU] via SUBCUTANEOUS

## 2020-05-21 MED ORDER — POTASSIUM CHLORIDE CRYS ER 20 MEQ PO TBCR
40.0000 meq | EXTENDED_RELEASE_TABLET | Freq: Once | ORAL | Status: AC
  Administered 2020-05-21: 40 meq via ORAL
  Filled 2020-05-21: qty 2

## 2020-05-21 MED ORDER — HYDRALAZINE HCL 20 MG/ML IJ SOLN
5.0000 mg | Freq: Four times a day (QID) | INTRAMUSCULAR | Status: DC | PRN
  Administered 2020-05-21 – 2020-05-22 (×3): 5 mg via INTRAVENOUS
  Filled 2020-05-21 (×3): qty 1

## 2020-05-21 MED ORDER — GUAIFENESIN-DM 100-10 MG/5ML PO SYRP
5.0000 mL | ORAL_SOLUTION | ORAL | Status: DC | PRN
  Administered 2020-05-21 – 2020-06-04 (×9): 5 mL via ORAL
  Filled 2020-05-21 (×9): qty 10

## 2020-05-21 MED ORDER — SODIUM CHLORIDE 0.9% FLUSH
10.0000 mL | Freq: Two times a day (BID) | INTRAVENOUS | Status: DC
  Administered 2020-05-21 – 2020-05-22 (×3): 10 mL
  Administered 2020-05-22: 20 mL
  Administered 2020-05-23 – 2020-05-24 (×3): 10 mL
  Administered 2020-05-24: 20 mL
  Administered 2020-05-25 – 2020-06-04 (×21): 10 mL
  Administered 2020-06-04: 40 mL
  Administered 2020-06-05 – 2020-06-06 (×3): 10 mL
  Administered 2020-06-06: 20 mL
  Administered 2020-06-07: 10 mL
  Administered 2020-06-07: 40 mL
  Administered 2020-06-08 – 2020-06-13 (×11): 10 mL

## 2020-05-21 MED ORDER — METHYLPREDNISOLONE SODIUM SUCC 125 MG IJ SOLR
45.0000 mg | Freq: Two times a day (BID) | INTRAMUSCULAR | Status: DC
  Administered 2020-05-21 – 2020-05-29 (×17): 45 mg via INTRAVENOUS
  Filled 2020-05-21 (×17): qty 2

## 2020-05-21 MED ORDER — SODIUM CHLORIDE 0.9 % IV SOLN
2.0000 g | Freq: Three times a day (TID) | INTRAVENOUS | Status: AC
  Administered 2020-05-21 – 2020-05-26 (×16): 2 g via INTRAVENOUS
  Filled 2020-05-21 (×16): qty 2

## 2020-05-21 MED ORDER — MORPHINE SULFATE (PF) 4 MG/ML IV SOLN
3.0000 mg | Freq: Once | INTRAVENOUS | Status: AC
  Administered 2020-05-21: 3 mg via INTRAVENOUS
  Filled 2020-05-21: qty 1

## 2020-05-21 MED ORDER — IPRATROPIUM-ALBUTEROL 20-100 MCG/ACT IN AERS
1.0000 | INHALATION_SPRAY | Freq: Four times a day (QID) | RESPIRATORY_TRACT | Status: DC
  Administered 2020-05-21 – 2020-06-04 (×50): 1 via RESPIRATORY_TRACT
  Filled 2020-05-21: qty 4

## 2020-05-21 MED ORDER — CHLORHEXIDINE GLUCONATE CLOTH 2 % EX PADS
6.0000 | MEDICATED_PAD | Freq: Every day | CUTANEOUS | Status: DC
  Administered 2020-05-21 – 2020-06-13 (×22): 6 via TOPICAL

## 2020-05-21 MED ORDER — CHLORHEXIDINE GLUCONATE CLOTH 2 % EX PADS
6.0000 | MEDICATED_PAD | Freq: Every day | CUTANEOUS | Status: DC
  Administered 2020-05-21: 6 via TOPICAL

## 2020-05-21 MED ORDER — ALPRAZOLAM 0.25 MG PO TABS
0.2500 mg | ORAL_TABLET | Freq: Two times a day (BID) | ORAL | Status: DC | PRN
  Administered 2020-05-21 – 2020-06-01 (×9): 0.25 mg via ORAL
  Filled 2020-05-21 (×10): qty 1

## 2020-05-21 MED ORDER — SULFAMETHOXAZOLE-TRIMETHOPRIM 400-80 MG PO TABS
1.0000 | ORAL_TABLET | Freq: Every day | ORAL | Status: DC
  Administered 2020-05-21 – 2020-06-05 (×15): 1 via ORAL
  Filled 2020-05-21 (×17): qty 1

## 2020-05-21 MED ORDER — OXYCODONE HCL 5 MG PO TABS
5.0000 mg | ORAL_TABLET | ORAL | Status: DC | PRN
  Administered 2020-05-21 – 2020-05-22 (×4): 5 mg via ORAL
  Filled 2020-05-21 (×4): qty 1

## 2020-05-21 MED ORDER — ENSURE ENLIVE PO LIQD
237.0000 mL | Freq: Two times a day (BID) | ORAL | Status: DC
  Administered 2020-05-21 – 2020-06-02 (×21): 237 mL via ORAL

## 2020-05-21 MED ORDER — DICLOFENAC SODIUM 1 % EX GEL
2.0000 g | Freq: Four times a day (QID) | CUTANEOUS | Status: DC
  Administered 2020-05-21 – 2020-06-03 (×46): 2 g via TOPICAL
  Filled 2020-05-21: qty 100

## 2020-05-21 MED ORDER — ALBUTEROL SULFATE HFA 108 (90 BASE) MCG/ACT IN AERS: 2.0000 | INHALATION_SPRAY | RESPIRATORY_TRACT | Status: DC | PRN

## 2020-05-21 MED ORDER — ALPRAZOLAM 0.25 MG PO TABS
0.2500 mg | ORAL_TABLET | Freq: Once | ORAL | Status: AC
  Administered 2020-05-21: 0.25 mg via ORAL
  Filled 2020-05-21: qty 1

## 2020-05-21 NOTE — Progress Notes (Signed)
Pharmacy Antibiotic Note  Audrey Gross is a 59 y.o. female admitted on 05/16/2020 with pneumonia.  PMH of CLL and recent hospitalization (8/2-8/7) for COVID-19, CXR with progression of pneumonia on 8/15.  Pharmacy has been consulted for Cefepime dosing.   Plan: Cefepime 2g IV q8h Follow up renal function, culture results, and clinical course.   Height: 5\' 7"  (170.2 cm) Weight: 88.5 kg (195 lb) IBW/kg (Calculated) : 61.6  Temp (24hrs), Avg:98.2 F (36.8 C), Min:98.1 F (36.7 C), Max:98.8 F (37.1 C)  Recent Labs  Lab 05/15/2020 1014 05/30/2020 1523 05/21/20 0545  WBC 10.8*  --  9.6  CREATININE 0.75  --  0.62  LATICACIDVEN 2.0* 1.0  --     Estimated Creatinine Clearance: 86.5 mL/min (by C-G formula based on SCr of 0.62 mg/dL).    No Known Allergies  Thank you for allowing pharmacy to be a part of this patient's care.   Gretta Arab PharmD, BCPS Clinical Pharmacist [facility] main pharmacy [phone] 05/21/2020 8:31 AM

## 2020-05-21 NOTE — TOC Initial Note (Signed)
Transition of Care [facility]) - Initial/Assessment Note    Patient Details  Name: Audrey Gross MRN: [id number] Date of Birth: [birth year]  [facility] CM/SW Contact:    Leeroy Cha, RN Phone Number: 05/21/2020, 10:41 AM  Clinical Narrative:                 Vaccinated patient with unvaccinated covid + daughter that waqs out of the [place] and returned home. HFNRBM at 40%, iv solu medrol, iv zithromax, maxipimke, redmesivir through [id number], iv vancomycin. Following for progression and toc needs.  Expected Discharge Plan: Home/Self Care Barriers to Discharge: Continued Medical Work up   Patient Goals and CMS Choice Patient states their goals for this hospitalization and ongoing recovery are:: to get over this CMS Medicare.gov Compare Post Acute Care list provided to:: Patient    Expected Discharge Plan and Services Expected Discharge Plan: Home/Self Care   Discharge Planning Services: CM Consult   Living arrangements for the past 2 months: Single Family Home                                      Prior Living Arrangements/Services Living arrangements for the past 2 months: Single Family Home Lives with:: Spouse Patient language and need for interpreter reviewed:: Yes Do you feel safe going back to the place where you live?: Yes      Need for Family Participation in Patient Care: Yes (Comment) Care giver support system in place?: Yes (comment)   Criminal Activity/Legal Involvement Pertinent to Current Situation/Hospitalization: No - Comment as needed  Activities of Daily Living      Permission Sought/Granted                  Emotional Assessment Appearance:: Appears stated age Attitude/Demeanor/Rapport: Engaged Affect (typically observed): Calm Orientation: : Oriented to Place, Oriented to Self, Oriented to  Time, Oriented to Situation Alcohol / Substance Use: Not Applicable Psych Involvement: No (comment)  Admission diagnosis:   Pneumonia due to COVID-19 virus [U07.1, J12.82] Patient Active Problem List   Diagnosis Date Noted  . Pneumonia due to COVID-19 virus 05/07/2020  . Physical debility 10/27/2019  . Acute esophagitis   . Gastritis without bleeding   . Duodenal nodule   . Dehydration 10/13/2019  . Dysphagia 10/03/2019  . Close exposure to COVID-19 virus 01/28/2019  . Drug-induced bradycardia 12/10/2018  . Numbness and tingling in left hand 07/07/2018  . Pancytopenia, acquired ([place]) 06/05/2018  . Drug-induced neutropenia ([place]) 11/25/2017  . PICC (peripherally inserted central catheter) flush 10/30/2017  . PICC (peripherally inserted central catheter) in place 10/30/2017  . Closed wedge compression fracture of first lumbar vertebra ([place]) 10/25/2017  . Acute midline low back pain with sciatica 10/22/2017  . Back injury, initial encounter 10/22/2017  . Acute bilateral thoracic back pain 10/22/2017  . Port-A-Cath in place 10/16/2017  . Acute respiratory failure with hypoxia ([place]) 09/22/2017  . Chronic systolic heart failure ([place]) 06/27/2017  . Goals of care, counseling/discussion 05/29/2017  . Nonischemic cardiomyopathy ([place]) 04/07/2017  . Chronic anticoagulation 03/23/2017  . Mitral regurgitation 02/28/2017  . Obesity 08/12/2016  . Hypertriglyceridemia without hypercholesterolemia 07/12/2016  . Screening for cervical cancer 06/11/2016  . PAF (paroxysmal atrial fibrillation) ([place]) 01/01/2016  . Hypokalemia 02/28/2014  . Abnormal mammogram of right breast 02/28/2014  . Dyslipidemia (high LDL; low HDL) 07/22/2012  . Osteopenia 06/21/2012  . Routine general medical examination at  a health care facility 06/21/2012  . Essential hypertension 06/21/2012  . CLL (chronic lymphocytic leukemia) ([place]) 01/05/2012  . Iron deficiency anemia 01/05/2012  . Depression 01/05/2012  . 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

## 2020-05-21 NOTE — Progress Notes (Signed)
Audrey Gross is a 59 y.o. female with the following history as recorded in EpicCare:  Patient Active Problem List   Diagnosis Date Noted  . Pneumonia due to COVID-19 virus 05/07/2020  . Physical debility 10/27/2019  . Acute esophagitis   . Gastritis without bleeding   . Duodenal nodule   . Dehydration 10/13/2019  . Dysphagia 10/03/2019  . Close exposure to COVID-19 virus 01/28/2019  . Drug-induced bradycardia 12/10/2018  . Numbness and tingling in left hand 07/07/2018  . Pancytopenia, acquired ([place]) 06/05/2018  . Drug-induced neutropenia ([place]) 11/25/2017  . PICC (peripherally inserted central catheter) flush 10/30/2017  . PICC (peripherally inserted central catheter) in place 10/30/2017  . Closed wedge compression fracture of first lumbar vertebra ([place]) 10/25/2017  . Acute midline low back pain with sciatica 10/22/2017  . Back injury, initial encounter 10/22/2017  . Acute bilateral thoracic back pain 10/22/2017  . Port-A-Cath in place 10/16/2017  . Acute respiratory failure with hypoxia ([place]) 09/22/2017  . Chronic systolic heart failure ([place]) 06/27/2017  . Goals of care, counseling/discussion 05/29/2017  . Nonischemic cardiomyopathy ([place]) 04/07/2017  . Chronic anticoagulation 03/23/2017  . Mitral regurgitation 02/28/2017  . Obesity 08/12/2016  . Hypertriglyceridemia without hypercholesterolemia 07/12/2016  . Screening for cervical cancer 06/11/2016  . PAF (paroxysmal atrial fibrillation) ([place]) 01/01/2016  . Hypokalemia 02/28/2014  . Abnormal mammogram of right breast 02/28/2014  . Dyslipidemia (high LDL; low HDL) 07/22/2012  . Osteopenia 06/21/2012  . Routine general medical examination at a health care facility 06/21/2012  . Essential hypertension 06/21/2012  . CLL (chronic lymphocytic leukemia) ([place]) 01/05/2012  . Iron deficiency anemia 01/05/2012  . Depression 01/05/2012  . 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   No current outpatient medications on file.   Facility-Administered Medications Ordered in Other Visits  Medication Dose Route Frequency Provider Last Rate Last Admin  . 0.9 %  sodium chloride infusion  250 mL Intravenous PRN Charlynne Cousins, MD      . acetaminophen (TYLENOL) tablet 650 mg  650 mg Oral Q6H PRN Charlynne Cousins, MD   650 mg at 05/21/20 0404  . apixaban (ELIQUIS) tablet 5 mg  5 mg Oral BID Charlynne Cousins, MD   5 mg at 05/21/20 0950  . azithromycin (ZITHROMAX) 500 mg in sodium chloride 0.9 % 250 mL IVPB  500 mg Intravenous Q24H Charlynne Cousins, MD 250 mL/hr at 05/21/20 1335 500 mg at 05/21/20 1335  . ceFEPIme (MAXIPIME) 2 g in sodium chloride 0.9 % 100 mL IVPB  2 g Intravenous Q8H Shade, Haze Justin, RPH   Stopped at 05/21/20 0949  . Chlorhexidine Gluconate Cloth 2 % PADS 6 each  6 each Topical Daily Charlynne Cousins, MD      . diclofenac Sodium (VOLTAREN) 1 % topical gel 2 g  2 g Topical QID Elodia Florence., MD   2 g at 05/21/20 [phone]  . feeding supplement (ENSURE ENLIVE) (ENSURE ENLIVE) liquid 237 mL  237 mL Oral BID BM Elodia Florence., MD   237 mL at 05/21/20 1214  . fluconazole (DIFLUCAN) tablet 150 mg  150 mg Oral Daily Elodia Florence., MD   150 mg at 05/21/20 0951  . guaiFENesin-dextromethorphan (ROBITUSSIN DM) 100-10 MG/5ML syrup 5 mL  5 mL Oral Q4H PRN Charlynne Cousins, MD   5 mL at 05/21/20 0954  . insulin aspart (novoLOG) injection  0-9 Units  0-9 Units Subcutaneous TID WC Elodia Florence., MD   1 Units at 05/21/20 1203  . lip balm (CARMEX) ointment   Topical PRN Elodia Florence., MD      . MEDLINE mouth rinse  15 mL Mouth Rinse BID Elodia Florence., MD   15 mL at 05/21/20 1204  . methylPREDNISolone sodium succinate (SOLU-MEDROL) 125 mg/2 mL injection 45 mg  45 mg Intravenous Q12H Elodia Florence., MD   45 mg at 05/21/20 0951  . metoprolol succinate  (TOPROL-XL) 24 hr tablet 150 mg  150 mg Oral Daily Charlynne Cousins, MD   150 mg at 05/21/20 0950  . ondansetron (ZOFRAN) tablet 4 mg  4 mg Oral Q6H PRN Charlynne Cousins, MD       Or  . ondansetron [facility]) injection 4 mg  4 mg Intravenous Q6H PRN Charlynne Cousins, MD      . oxyCODONE (Oxy IR/ROXICODONE) immediate release tablet 5 mg  5 mg Oral Q4H PRN Elodia Florence., MD   5 mg at 05/21/20 0949  . pantoprazole (PROTONIX) EC tablet 40 mg  40 mg Oral BID Charlynne Cousins, MD   40 mg at 05/21/20 0949  . polyethylene glycol (MIRALAX / GLYCOLAX) packet 17 g  17 g Oral Daily PRN Charlynne Cousins, MD      . remdesivir 100 mg in sodium chloride 0.9 % 100 mL IVPB  100 mg Intravenous Daily Charlynne Cousins, MD   Stopped at 05/21/20 1232  . sacubitril-valsartan (ENTRESTO) 49-51 mg per tablet  1 tablet Oral BID Charlynne Cousins, MD   1 tablet at 05/21/20 0950  . sodium chloride flush (NS) 0.9 % injection 10-40 mL  10-40 mL Intracatheter Q12H Charlynne Cousins, MD   10 mL at 05/21/20 0952  . sodium chloride flush (NS) 0.9 % injection 10-40 mL  10-40 mL Intracatheter PRN Charlynne Cousins, MD      . sulfamethoxazole-trimethoprim (BACTRIM) 400-80 MG per tablet 1 tablet  1 tablet Oral Daily Elodia Florence., MD      . valGANciclovir (VALCYTE) 450 MG tablet TABS 900 mg  900 mg Oral Daily Charlynne Cousins, MD   900 mg at 05/21/20 7782    Allergies: Patient has no known allergies.  Past Medical History:  Diagnosis Date  . Atrial fibrillation ([place])   . Cystic breast   . Depression   . Diffuse cystic mastopathy   . Dyslipidemia (high LDL; low HDL)   . Hypertension   . Iron deficiency anemia, unspecified   . Lymphadenopathy of head and neck 04/05/2015  . Medical non-compliance   . Mitral regurgitation 02/2017   moderate to severe  . Persistent atrial fibrillation with rapid ventricular response ([place]) 07/15/2017    Past Surgical History:  Procedure  Laterality Date  . BIOPSY  10/17/2019   Procedure: BIOPSY;  Surgeon: Jerene Bears, MD;  Location: [facility] ENDOSCOPY;  Service: Endoscopy;;  . CARDIOVERSION N/A 04/02/2017   Procedure: CARDIOVERSION;  Surgeon: Dorothy Spark, MD;  Location: [facility] ENDOSCOPY;  Service: Cardiovascular;  Laterality: N/A;  . CARDIOVERSION N/A 08/19/2017   Procedure: CARDIOVERSION;  Surgeon: Larey Dresser, MD;  Location: [facility] ENDOSCOPY;  Service: Cardiovascular;  Laterality: N/A;  . ESOPHAGOGASTRODUODENOSCOPY (EGD) WITH PROPOFOL N/A 10/17/2019   Procedure: ESOPHAGOGASTRODUODENOSCOPY (EGD) WITH PROPOFOL;  Surgeon: Jerene Bears, MD;  Location: [facility] ENDOSCOPY;  Service: Endoscopy;  Laterality: N/A;  . IR IMAGING  GUIDED PORT INSERTION  06/29/2018  . TUBAL LIGATION      Family History  Problem Relation Age of Onset  . Sudden death Mother   . Kidney disease Father        H/O HD and kidney transplant  . Stomach cancer Maternal Aunt   . Stomach cancer Paternal Grandmother   . Heart disease Brother   . Heart disease Maternal Grandmother     Social History   Tobacco Use  . Smoking status: Never Smoker  . Smokeless tobacco: Never Used  Substance Use Topics  . Alcohol use: No    Patient is in hospital; OV was cancelled;

## 2020-05-21 NOTE — Progress Notes (Signed)
Central Telemetry monitoring notified this RN twice of patient having PVCs frequently. Patient with no symptoms currently, HR 70's. Notified MD.

## 2020-05-21 NOTE — Progress Notes (Signed)
  Echocardiogram 2D Echocardiogram has been performed.  Audrey Gross 05/21/2020, 3:37 PM

## 2020-05-21 NOTE — Progress Notes (Signed)
Bilateral lower extremity venous duplex has been completed. Preliminary results can be found in CV Proc through chart review.   05/21/20 11:12 AM Audrey Gross RVT

## 2020-05-21 NOTE — Progress Notes (Addendum)
PROGRESS NOTE    [name]  WRU:[id number] DOB: 05/31/1961 DOA: 05/10/2020 PCP: Janith Lima, MD   Chief Complaint  Patient presents with  . COVID Positive    Brief Narrative:  Audrey Gross is Audrey Gross 59 y.o. 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 with her last treatment about 2 weeks prior to admission, paroxysmal atrial fibrillation on Eliquis, obesity history of nonischemic cardiomyopathy with an EF of 35% back in 2019 repeated 2D echo that showed an improvement in ejection fraction to 50%, MR, she has not been vaccinated recently discharged from the hospital on 05/12/2020 and treated for COVID-19 infection she went home off oxygen comes in again for shortness of breath that started about 5 days prior to admission, she relates being short of breath just with getting dressed this morning, positive for fever, taking Tylenol at home, with an ongoing productive cough.  In the ED: Currently hypoxic satting 76% placed on 15 L nonrebreather and remain less than 90% so she had to be placed on Branae Crail nonrebreather nasal cannula with improvement in her saturations around 90 with Rilynn Habel white count of 10.8 lymphocytic predominant, lactic acidosis, imaging showed worsening infiltrates.  Assessment & Plan:   Active Problems:   CLL (chronic lymphocytic leukemia) (HCC)   PAF (paroxysmal atrial fibrillation) (HCC)   Nonischemic cardiomyopathy (HCC)   Chronic systolic heart failure (HCC)   Acute respiratory failure with hypoxia (HCC)   Pneumonia due to COVID-19 virus  Acute respiratory failure with hypoxia secondary to pneumonia due to COVID-19: Unvaccinated Tested positive on 8/2 S/p steroids/remdesivir course during last hospital admission (completed 10 days of steroids) Requiring 40 L HHFNC + NRB Restart remdesivir Will trial steroids again Procal up, continue antibiotics with cefepime, azithro.  Ok to d/c vanc with negative MRSA pcr. Strict I/O, daily weights CXR with progression of  pneumonia Blood cx pending (only 1 collected) Follow urine legionella, negative strep pneumonia.  Follow sputum cx.  Negative MRSA pcr. OOB, prone as able, IS, flutter   Elevated Troponin:  Mildly elevated, suspect this is 2/2 COVID above, low suspicion for ACS EKG yesterday with wandering baseline, poor quality Repeat EKG and troponins today  Paroxysmal atrial fibrillation: Currently in sinus rhythm she relates she feels palpitations we will continue her metoprolol and Eliquis.  Odynophagia: looks like she was being treated empirically for 14 days for candidal esophagitis.  Will continue here.  CLL (chronic lymphocytic leukemia) ([place]) Noted will have to hold chemotherapy. Continue bactrim and valganciclovir  Nonischemic cardiomyopathy (HCC)  Chronic systolic heart failure (HCC) Continue metoprolol, entresto Repeat echo   Transaminitis: Likely due to COVID-19, currently on IV remdesivir. Mild, will continue to monitor  Hypokalemia: Replace, follow   Prediabetes:  SSI, follow on steroids  DVT prophylaxis: eliquis Code Status: full  Family Communication: none at bedside Disposition:   Status is: Inpatient  Remains inpatient appropriate because:Inpatient level of care appropriate due to severity of illness   Dispo: The patient is from: Home              Anticipated d/c is to: Home              Anticipated d/c date is: > 3 days              Patient currently is not medically stable to d/c.   Consultants:   none  Procedures:   none  Antimicrobials:  Anti-infectives (From admission, onward)   Start     Dose/Rate Route  Frequency Ordered Stop   05/21/20 1000  remdesivir 100 mg in sodium chloride 0.9 % 100 mL IVPB     Discontinue    "Followed by" Linked Group Details   100 mg 200 mL/hr over 30 Minutes Intravenous Daily 05/13/2020 1333 05/25/20 0959   05/21/20 0900  ceFEPIme (MAXIPIME) 2 g in sodium chloride 0.9 % 100 mL IVPB     Discontinue     2 g 200 mL/hr  over 30 Minutes Intravenous Every 8 hours 05/21/20 0832     05/17/2020 2200  valGANciclovir (VALCYTE) 450 MG tablet TABS 900 mg     Discontinue     900 mg Oral Daily 05/14/2020 1421     06/04/2020 2100  vancomycin (VANCOCIN) IVPB 1000 mg/200 mL premix  Status:  Discontinued        1,000 mg 200 mL/hr over 60 Minutes Intravenous Every 8 hours 05/18/2020 1239 05/21/20 1055   05/17/2020 1430  fluconazole (DIFLUCAN) tablet 150 mg     Discontinue     150 mg Oral Daily 05/19/2020 1421     05/26/2020 1430  cefTRIAXone (ROCEPHIN) 2 g in sodium chloride 0.9 % 100 mL IVPB  Status:  Discontinued        2 g 200 mL/hr over 30 Minutes Intravenous Every 24 hours 05/24/2020 1421 05/21/20 0815   05/27/2020 1400  azithromycin (ZITHROMAX) 500 mg in sodium chloride 0.9 % 250 mL IVPB     Discontinue     500 mg 250 mL/hr over 60 Minutes Intravenous Every 24 hours 06/04/2020 1337 05/25/20 1359   05/16/2020 1333  remdesivir 200 mg in sodium chloride 0.9% 250 mL IVPB       "Followed by" Linked Group Details   200 mg 580 mL/hr over 30 Minutes Intravenous Once 05/15/2020 1333 05/19/2020 1614   05/21/2020 1245  vancomycin (VANCOREADY) IVPB 1500 mg/300 mL        1,500 mg 150 mL/hr over 120 Minutes Intravenous  Once 05/07/2020 1239 06/03/2020 1812   06/01/2020 1215  ceFEPIme (MAXIPIME) 2 g in sodium chloride 0.9 % 100 mL IVPB        2 g 200 mL/hr over 30 Minutes Intravenous  Once 05/24/2020 1213 05/06/2020 1350     Subjective: C/o back pain/neck pain on R side Breathing feels about the same  Objective: Vitals:   05/21/20 0823 05/21/20 0830 05/21/20 0900 05/21/20 1000  BP:    (!) 164/96  Pulse: 71  71 81  Resp: (!) 30  20 (!) 28  Temp:  97.8 F (36.6 C)    TempSrc:  Axillary    SpO2: 98%  95% 93%  Weight:      Height:        Intake/Output Summary (Last 24 hours) at 05/21/2020 1128 Last data filed at 05/21/2020 0800 Gross per 24 hour  Intake 699.47 ml  Output --  Net 699.47 ml   Filed Weights   05/31/2020 1003 05/18/2020 1052  Weight: 88  kg 88.5 kg    Examination:  General exam: Appears calm and comfortable  Respiratory system: no adventitious lung sounds appreciated, appears comfortable on heated high flow Cardiovascular system: S1 & S2 heard, RRR.  Gastrointestinal system: Abdomen is nondistended, soft and nontender. Central nervous system: Alert and oriented. No focal neurological deficits. Extremities: no LEE Skin: No rashes, lesions or ulcers Psychiatry: Judgement and insight appear normal. Mood & affect appropriate.     Data Reviewed: I have personally reviewed following labs and imaging studies  CBC: Recent  Labs  Lab 06/04/2020 1014 05/21/20 0545  WBC 10.8* 9.6  NEUTROABS 2.1 2.5  HGB 11.5* 11.3*  HCT 35.2* 35.2*  MCV 86.9 87.8  PLT 237 376    Basic Metabolic Panel: Recent Labs  Lab 05/23/2020 1014 05/21/20 0545  NA 139 144  K 3.8 3.4*  CL 106 108  CO2 22 21*  GLUCOSE 129* 155*  BUN 15 17  CREATININE 0.75 0.62  CALCIUM 8.2* 8.6*    GFR: Estimated Creatinine Clearance: 86.5 mL/min (by C-G formula based on SCr of 0.62 mg/dL).  Liver Function Tests: Recent Labs  Lab 05/28/2020 1014 05/21/20 0545  AST 54* 35  ALT 59* 48*  ALKPHOS 46 43  BILITOT 0.9 1.2  PROT 6.3* 6.2*  ALBUMIN 2.6* 2.7*    CBG: Recent Labs  Lab 05/21/20 0326 05/21/20 0835  GLUCAP 141* 146*     Recent Results (from the past 240 hour(s))  Culture, blood (Routine x 2)     Status: None (Preliminary result)   Collection Time: 05/13/2020 10:14 AM   Specimen: BLOOD  Result Value Ref Range Status   Specimen Description   Final    BLOOD PORTA CATH Performed at [facility], [place] [address], [place], [place] [address]    Special Requests   Final    BOTTLES DRAWN AEROBIC AND ANAEROBIC Blood Culture adequate volume Performed at [facility] [address] [place], [place] [address]    Culture   Final    NO GROWTH < 24 HOURS Performed at [facility] [address] [place], [place] [address]    Report Status PENDING  Incomplete  MRSA PCR Screening     Status: None   Collection Time: 05/21/20  4:32 AM   Specimen: Nasal Mucosa; Nasopharyngeal  Result Value Ref Range Status   MRSA by PCR NEGATIVE NEGATIVE Final    Comment:        The GeneXpert MRSA Assay (FDA approved for NASAL specimens only), is one component of Konica Stankowski comprehensive MRSA colonization surveillance program. It is not intended to diagnose MRSA infection nor to guide or monitor treatment for MRSA infections. Performed at [facility], [place] [address] [place], [place] [address]          Radiology Studies: DG Chest Portable 1 View  Result Date: 06/03/2020 CLINICAL DATA:  COVID positive, cough EXAM: PORTABLE CHEST 1 VIEW COMPARISON:  05/11/2020 FINDINGS: Right chest wall port catheter is unchanged. There are increased bilateral opacities primarily involving mid and lower lungs. No significant pleural effusion. Normal heart size. IMPRESSION: Progression of pneumonia since 05/11/2020. Electronically Signed   By: Macy Mis M.D.   On: 05/26/2020 11:22        Scheduled Meds: . apixaban  5 mg Oral BID  . Chlorhexidine Gluconate Cloth  6 each Topical Daily  . diclofenac Sodium  2 g Topical QID  . feeding supplement (ENSURE ENLIVE)  237 mL Oral BID BM  . fluconazole  150 mg Oral Daily  . insulin aspart  0-9 Units Subcutaneous TID WC  . mouth rinse  15 mL Mouth Rinse BID  . methylPREDNISolone (SOLU-MEDROL) injection  45 mg Intravenous Q12H  . metoprolol succinate  150 mg Oral Daily  . pantoprazole  40 mg Oral BID  . sacubitril-valsartan  1 tablet Oral BID  . sodium chloride flush  10-40 mL Intracatheter Q12H  . valGANciclovir  900 mg Oral Daily   Continuous Infusions: . sodium chloride    .  azithromycin Stopped (06/02/2020 1811)  . ceFEPime (MAXIPIME) IV Stopped (05/21/20 0949)  . remdesivir 100 mg in NS 100 mL       LOS: 1 day    Time spent: over 30  min 40 min critical care time with AHRF 2/2 COVID pneumonia, requiring hhfnc    Fayrene Helper, MD [facility]   To contact the attending provider between 7A-7P or the covering provider during after hours 7P-7A, please log into the web site [email or web address] and access using universal [place] password for that web site. If you do not have the password, please call the hospital operator.  05/21/2020, 11:28 AM

## 2020-05-21 NOTE — Progress Notes (Signed)
Pt received COVID vaccine, 2nd dose received April 21, 2020. However, pt daughter (unvaccinated) returned from [place] testing positive for [place] 19.  Verified by previous admission note (05/07/20)

## 2020-05-22 ENCOUNTER — Inpatient Hospital Stay (HOSPITAL_COMMUNITY): Payer: BC Managed Care – PPO

## 2020-05-22 DIAGNOSIS — U071 COVID-19: Principal | ICD-10-CM

## 2020-05-22 DIAGNOSIS — J9601 Acute respiratory failure with hypoxia: Secondary | ICD-10-CM

## 2020-05-22 LAB — BRAIN NATRIURETIC PEPTIDE: B Natriuretic Peptide: 126.2 pg/mL — ABNORMAL HIGH (ref 0.0–100.0)

## 2020-05-22 LAB — COMPREHENSIVE METABOLIC PANEL
ALT: 39 U/L (ref 0–44)
AST: 25 U/L (ref 15–41)
Albumin: 2.5 g/dL — ABNORMAL LOW (ref 3.5–5.0)
Alkaline Phosphatase: 49 U/L (ref 38–126)
Anion gap: 11 (ref 5–15)
BUN: 23 mg/dL — ABNORMAL HIGH (ref 6–20)
CO2: 18 mmol/L — ABNORMAL LOW (ref 22–32)
Calcium: 8.5 mg/dL — ABNORMAL LOW (ref 8.9–10.3)
Chloride: 113 mmol/L — ABNORMAL HIGH (ref 98–111)
Creatinine, Ser: 0.64 mg/dL (ref 0.44–1.00)
GFR calc Af Amer: >60 mL/min (ref >60)
GFR calc non Af Amer: >60 mL/min (ref >60)
Glucose, Bld: 283 mg/dL — ABNORMAL HIGH (ref 70–99)
Potassium: 4.2 mmol/L (ref 3.5–5.1)
Sodium: 142 mmol/L (ref 135–145)
Total Bilirubin: 0.8 mg/dL (ref 0.3–1.2)
Total Protein: 6.1 g/dL — ABNORMAL LOW (ref 6.5–8.1)

## 2020-05-22 LAB — CBC WITH DIFFERENTIAL/PLATELET
Abs Immature Granulocytes: 0.24 10*3/uL — ABNORMAL HIGH (ref 0.00–0.07)
Basophils Absolute: 0.1 10*3/uL (ref 0.0–0.1)
Basophils Relative: 1 %
Eosinophils Absolute: 0 10*3/uL (ref 0.0–0.5)
Eosinophils Relative: 0 %
HCT: 36 % (ref 36.0–46.0)
Hemoglobin: 11.6 g/dL — ABNORMAL LOW (ref 12.0–15.0)
Immature Granulocytes: 2 %
Lymphocytes Relative: 48 %
Lymphs Abs: 6 10*3/uL — ABNORMAL HIGH (ref 0.7–4.0)
MCH: 28.5 pg (ref 26.0–34.0)
MCHC: 32.2 g/dL (ref 30.0–36.0)
MCV: 88.5 fL (ref 80.0–100.0)
Monocytes Absolute: 3.2 10*3/uL — ABNORMAL HIGH (ref 0.1–1.0)
Monocytes Relative: 26 %
Neutro Abs: 2.9 10*3/uL (ref 1.7–7.7)
Neutrophils Relative %: 23 %
Platelets: 240 10*3/uL (ref 150–400)
RBC: 4.07 MIL/uL (ref 3.87–5.11)
RDW: 14.7 % (ref 11.5–15.5)
WBC Morphology: ABNORMAL
WBC: 12.4 10*3/uL — ABNORMAL HIGH (ref 4.0–10.5)
nRBC: 0 % (ref 0.0–0.2)

## 2020-05-22 LAB — BLOOD GAS, ARTERIAL
Acid-base deficit: 1.6 mmol/L (ref 0.0–2.0)
Bicarbonate: 19.1 mmol/L — ABNORMAL LOW (ref 20.0–28.0)
FIO2: 100
O2 Saturation: 91.6 %
Patient temperature: 98.6
pCO2 arterial: 22.7 mmHg — ABNORMAL LOW (ref 32.0–48.0)
pH, Arterial: 7.535 — ABNORMAL HIGH (ref 7.350–7.450)
pO2, Arterial: 64.3 mmHg — ABNORMAL LOW (ref 83.0–108.0)

## 2020-05-22 LAB — GLUCOSE, CAPILLARY
Glucose-Capillary: 235 mg/dL — ABNORMAL HIGH (ref 70–99)
Glucose-Capillary: 166 mg/dL — ABNORMAL HIGH (ref 70–99)
Glucose-Capillary: 215 mg/dL — ABNORMAL HIGH (ref 70–99)
Glucose-Capillary: 162 mg/dL — ABNORMAL HIGH (ref 70–99)

## 2020-05-22 LAB — TROPONIN I (HIGH SENSITIVITY)
Troponin I (High Sensitivity): 9 ng/L (ref <18)
Troponin I (High Sensitivity): 9 ng/L (ref <18)

## 2020-05-22 LAB — MAGNESIUM: Magnesium: 2.2 mg/dL (ref 1.7–2.4)

## 2020-05-22 LAB — D-DIMER, QUANTITATIVE: D-Dimer, Quant: 3.31 ug/mL-FEU — ABNORMAL HIGH (ref 0.00–0.50)

## 2020-05-22 LAB — PROCALCITONIN: Procalcitonin: 0.57 ng/mL

## 2020-05-22 LAB — C-REACTIVE PROTEIN: CRP: 16 mg/dL — ABNORMAL HIGH (ref <1.0)

## 2020-05-22 MED ORDER — FUROSEMIDE 10 MG/ML IJ SOLN
40.0000 mg | Freq: Once | INTRAMUSCULAR | Status: AC
  Administered 2020-05-22: 40 mg via INTRAVENOUS
  Filled 2020-05-22: qty 4

## 2020-05-22 MED ORDER — INSULIN DETEMIR 100 UNIT/ML ~~LOC~~ SOLN
5.0000 [IU] | Freq: Two times a day (BID) | SUBCUTANEOUS | Status: DC
  Administered 2020-05-22 – 2020-06-13 (×45): 5 [IU] via SUBCUTANEOUS
  Filled 2020-05-22 (×45): qty 0.05

## 2020-05-22 MED ORDER — METOPROLOL TARTRATE 5 MG/5ML IV SOLN: 5.0000 mg | INTRAVENOUS | Status: DC | PRN

## 2020-05-22 MED ORDER — ADULT MULTIVITAMIN W/MINERALS CH
1.0000 | ORAL_TABLET | Freq: Every day | ORAL | Status: DC
  Administered 2020-05-22 – 2020-06-03 (×13): 1 via ORAL
  Filled 2020-05-22 (×13): qty 1

## 2020-05-22 MED ORDER — MORPHINE SULFATE (PF) 2 MG/ML IV SOLN
1.0000 mg | INTRAVENOUS | Status: DC | PRN
  Administered 2020-05-22: 1 mg via INTRAVENOUS
  Filled 2020-05-22: qty 1

## 2020-05-22 MED ORDER — MORPHINE SULFATE (PF) 2 MG/ML IV SOLN: 1.0000 mg | Freq: Once | INTRAVENOUS | Status: DC | PRN

## 2020-05-22 MED ORDER — BARICITINIB 2 MG PO TABS
4.0000 mg | ORAL_TABLET | Freq: Every day | ORAL | Status: AC
  Administered 2020-05-22 – 2020-06-03 (×13): 4 mg via ORAL
  Filled 2020-05-22 (×13): qty 2

## 2020-05-22 NOTE — Progress Notes (Addendum)
PROGRESS NOTE    Audrey Gross  NOT:[id number] DOB: Dec 08, 1960 DOA: 05/26/2020 PCP: Janith Lima, MD   Chief Complaint  Patient presents with  . COVID Positive    Brief Narrative:  Audrey Gross is Audrey Gross 59 y.o. 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 with her last treatment about 2 weeks prior to admission, paroxysmal atrial fibrillation on Eliquis, obesity history of nonischemic cardiomyopathy with an EF of 35% back in 2019 repeated 2D echo that showed an improvement in ejection fraction to 50%, MR, she has not been vaccinated recently discharged from the hospital on 05/12/2020 and treated for COVID-19 infection she went home off oxygen comes in again for shortness of breath that started about 5 days prior to admission.  She was readmitted with acute hypoxic respiratory failure after recent discharge after treatment for COVID infection.   Assessment & Plan:   Active Problems:   CLL (chronic lymphocytic leukemia) (HCC)   PAF (paroxysmal atrial fibrillation) (HCC)   Nonischemic cardiomyopathy (HCC)   Chronic systolic heart failure (HCC)   Acute respiratory failure with hypoxia (HCC)   Pneumonia due to COVID-19 virus  Acute respiratory failure with hypoxia secondary to pneumonia due to COVID-19: Unvaccinated Tested positive on 8/2 S/p steroids/remdesivir course during last hospital admission (completed 10 days of steroids) Requiring 40 L HHFNC + NRB -> tachypnea noted, but satting well - will continue to monitor closely - low threshold for PCCM Restart remdesivir Will trial steroids again Procal up (0.41 -> 0.87 -> 0.57), continue antibiotics with cefepime, azithro.  Ok to d/c vanc with negative MRSA pcr. Inflammatory markers notable for downtrending CRP, rising d dimer Elevated BNP - lasix as tolerated to keep even to net negative Strict I/O, daily weights CXR with progression of pneumonia Blood cx NGTD x 2 (only 1 collected) Follow urine legionella, negative strep  pneumonia.  Follow sputum cx.  Negative MRSA pcr. OOB, prone as able, IS, flutter   COVID-19 Labs  Recent Labs    05/26/2020 1110 05/21/20 0545 05/22/20 0525 05/22/20 0526  DDIMER 1.88* 1.81* 3.31*  --   FERRITIN 3,361*  --   --   --   LDH 506*  --   --   --   CRP 18.3* 25.8*  --  16.0*    Lab Results  Component Value Date   SARSCOV2NAA NEGATIVE 10/13/2019   SARSCOV2NAA NOT DETECTED 10/07/2019    Elevated Troponin:  Mildly elevated, suspect this is 2/2 COVID above, low suspicion for ACS EKG from admission with wandering baseline, poor quality EKG 8/16 with RAD, diffuse T wave inversions -> troponins normalized Repeat EKG 8/17 Echo with EF 60-65%, no RWMA (see report)  NAGMA: hyperchloremic, will continue to monitor   Elevated D dimer: 2/2 COVID 19 infection.  She's already on eliquis.  Had LE [place] yesterday negative for DVT.  Consider repeat evaluation if worsening.   Paroxysmal atrial fibrillation: Currently in sinus rhythm she relates she feels palpitations we will continue her metoprolol and Eliquis.  Poor PO intake: appreciate RD recs, consider NGT placement and tube feeds  Odynophagia: looks like she was being treated empirically for 14 days for candidal esophagitis.  Will continue here.  CLL (chronic lymphocytic leukemia) ([place]) Noted will have to hold chemotherapy. Continue bactrim and valganciclovir  Nonischemic cardiomyopathy (HCC)  Chronic systolic heart failure (HCC) Continue metoprolol, entresto Repeat echo   Transaminitis: Likely due to COVID-19, currently on IV remdesivir. Mild, will continue to monitor  Hypokalemia: Replace, follow  Prediabetes:  SSI, add basal insulin Continue to monitor  DVT prophylaxis: eliquis Code Status: full  Family Communication: none at bedside Disposition:   Status is: Inpatient  Remains inpatient appropriate because:Inpatient level of care appropriate due to severity of illness   Dispo: The patient is  from: Home              Anticipated d/c is to: Home              Anticipated d/c date is: > 3 days              Patient currently is not medically stable to d/c.   Consultants:   none  Procedures:   none  Antimicrobials:  Anti-infectives (From admission, onward)   Start     Dose/Rate Route Frequency Ordered Stop   05/21/20 1300  sulfamethoxazole-trimethoprim (BACTRIM) 400-80 MG per tablet 1 tablet     Discontinue     1 tablet Oral Daily 05/21/20 1147     05/21/20 1000  remdesivir 100 mg in sodium chloride 0.9 % 100 mL IVPB     Discontinue    "Followed by" Linked Group Details   100 mg 200 mL/hr over 30 Minutes Intravenous Daily 05/30/2020 1333 05/25/20 0959   05/21/20 0900  ceFEPIme (MAXIPIME) 2 g in sodium chloride 0.9 % 100 mL IVPB     Discontinue     2 g 200 mL/hr over 30 Minutes Intravenous Every 8 hours 05/21/20 0832     05/13/2020 2200  valGANciclovir (VALCYTE) 450 MG tablet TABS 900 mg     Discontinue     900 mg Oral Daily 06/04/2020 1421     05/16/2020 2100  vancomycin (VANCOCIN) IVPB 1000 mg/200 mL premix  Status:  Discontinued        1,000 mg 200 mL/hr over 60 Minutes Intravenous Every 8 hours 05/28/2020 1239 05/21/20 1055   05/25/2020 1430  fluconazole (DIFLUCAN) tablet 150 mg     Discontinue     150 mg Oral Daily 05/30/2020 1421 05/26/20 2359   05/09/2020 1430  cefTRIAXone (ROCEPHIN) 2 g in sodium chloride 0.9 % 100 mL IVPB  Status:  Discontinued        2 g 200 mL/hr over 30 Minutes Intravenous Every 24 hours 05/16/2020 1421 05/21/20 0815   06/02/2020 1400  azithromycin (ZITHROMAX) 500 mg in sodium chloride 0.9 % 250 mL IVPB     Discontinue     500 mg 250 mL/hr over 60 Minutes Intravenous Every 24 hours 05/12/2020 1337 05/25/20 1359   05/19/2020 1333  remdesivir 200 mg in sodium chloride 0.9% 250 mL IVPB       "Followed by" Linked Group Details   200 mg 580 mL/hr over 30 Minutes Intravenous Once 06/01/2020 1333 05/14/2020 1614   05/24/2020 1245  vancomycin (VANCOREADY) IVPB 1500 mg/300 mL         1,500 mg 150 mL/hr over 120 Minutes Intravenous  Once 05/21/2020 1239 05/28/2020 1812   05/11/2020 1215  ceFEPIme (MAXIPIME) 2 g in sodium chloride 0.9 % 100 mL IVPB        2 g 200 mL/hr over 30 Minutes Intravenous  Once 06/05/2020 1213 05/17/2020 1350     Subjective: C/o persistent SOB  Objective: Vitals:   05/22/20 1300 05/22/20 1400 05/22/20 1422 05/22/20 1500  BP: (!) 162/97 (!) 147/70  (!) 160/92  Pulse: 80 84  66  Resp: (!) 45 (!) 39  (!) 44  Temp:      TempSrc:  SpO2: (!) 89% 97% 94% 93%  Weight:      Height:        Intake/Output Summary (Last 24 hours) at 05/22/2020 1557 Last data filed at 05/22/2020 1419 Gross per 24 hour  Intake 950.3 ml  Output 800 ml  Net 150.3 ml   Filed Weights   05/28/2020 1003 05/08/2020 1052 05/22/20 0539  Weight: 88 kg 88.5 kg 77.7 kg    Examination:  General: No acute distress. Cardiovascular: RRR Lungs: tachypneic, no apparent adventitious lung sounds with isolation scope Abdomen: Soft, nontender, nondistended Neurological: Alert and oriented 3. Moves all extremities 4. Cranial nerves II through XII grossly intact. Skin: Warm and dry. No rashes or lesions. Extremities: No clubbing or cyanosis. No edema   Data Reviewed: I have personally reviewed following labs and imaging studies  CBC: Recent Labs  Lab 05/18/2020 1014 05/21/20 0545 05/22/20 0525  WBC 10.8* 9.6 12.4*  NEUTROABS 2.1 2.5 2.9  HGB 11.5* 11.3* 11.6*  HCT 35.2* 35.2* 36.0  MCV 86.9 87.8 88.5  PLT 237 213 212    Basic Metabolic Panel: Recent Labs  Lab 05/27/2020 1014 05/21/20 0545 05/22/20 0525  NA 139 144 142  K 3.8 3.4* 4.2  CL 106 108 113*  CO2 22 21* 18*  GLUCOSE 129* 155* 283*  BUN 15 17 23*  CREATININE 0.75 0.62 0.64  CALCIUM 8.2* 8.6* 8.5*    GFR: Estimated Creatinine Clearance: 81.3 mL/min (by C-G formula based on SCr of 0.64 mg/dL).  Liver Function Tests: Recent Labs  Lab 06/02/2020 1014 05/21/20 0545 05/22/20 0525  AST 54* 35 25  ALT  59* 48* 39  ALKPHOS 46 43 49  BILITOT 0.9 1.2 0.8  PROT 6.3* 6.2* 6.1*  ALBUMIN 2.6* 2.7* 2.5*    CBG: Recent Labs  Lab 05/21/20 1201 05/21/20 1535 05/21/20 2109 05/22/20 0814 05/22/20 1154  GLUCAP 143* 269* 147* 235* 166*     Recent Results (from the past 240 hour(s))  Culture, blood (Routine x 2)     Status: None (Preliminary result)   Collection Time: 05/13/2020 10:14 AM   Specimen: BLOOD  Result Value Ref Range Status   Specimen Description   Final    BLOOD PORTA CATH Performed at [facility], [place] [address] [place], [place] [address]    Special Requests   Final    BOTTLES DRAWN AEROBIC AND ANAEROBIC Blood Culture adequate volume Performed at [place] [address], [place], [place] [address]    Culture   Final    NO GROWTH 2 DAYS Performed at [facility] [address], [place], [place] [address]    Report Status PENDING  Incomplete  MRSA PCR Screening     Status: None   Collection Time: 05/21/20  4:32 AM   Specimen: Nasal Mucosa; Nasopharyngeal  Result Value Ref Range Status   MRSA by PCR NEGATIVE NEGATIVE Final    Comment:        The GeneXpert MRSA Assay (FDA approved for NASAL specimens only), is one component of Danijela Vessey comprehensive MRSA colonization surveillance program. It is not intended to diagnose MRSA infection nor to guide or monitor treatment for MRSA infections. Performed at [facility], [place] [address], [place], [place] [address]          Radiology Studies: ECHOCARDIOGRAM COMPLETE  Result Date: 05/21/2020    ECHOCARDIOGRAM REPORT   Patient Name:   Audrey Gross Date of Exam: 05/21/2020 Medical Rec #:  [id number]     Height:       67.0 in Accession #:    [id number]    Weight:       195.0 lb Date of Birth:  1960-10-12     BSA:          2.001 m Patient Age:    59 years      BP:           160/129 mmHg Patient Gender: F             HR:           58 bpm. Exam Location:   Inpatient Procedure: 2D Echo Indications:    CHF 428  History:        Patient has prior history of Echocardiogram examinations, most                 recent 01/31/2019. Arrythmias:Atrial Fibrillation; Risk                 Factors:Hypertension and Dyslipidemia. Hx of cardioversion.                 covid +.  Sonographer:    Mikki Santee RDCS (AE) Referring Phys: [phone] Rochanda Harpham CALDWELL POWELL JR  Sonographer Comments: Image acquisition challenging due to respiratory motion. patient on Bi-Pap during exam IMPRESSIONS  1. Left ventricular ejection fraction, by estimation, is 60 to 65%. The left ventricle has normal function. The left ventricle has no regional wall motion abnormalities. There is mild left ventricular hypertrophy. Left ventricular diastolic parameters were normal.  2. Right ventricular systolic function is normal. The right ventricular size is normal.  3. The mitral valve is normal in structure. Trivial mitral valve regurgitation. No evidence of mitral stenosis.  4. The aortic valve is tricuspid. Aortic valve regurgitation is trivial. Mild to moderate aortic valve sclerosis/calcification is present, without any evidence of aortic stenosis.  5. The inferior vena cava is normal in size with greater than 50% respiratory variability, suggesting right atrial pressure of 3 mmHg. FINDINGS  Left Ventricle: Left ventricular ejection fraction, by estimation, is 60 to 65%. The left ventricle has normal function. The left ventricle has no regional wall motion abnormalities. The left ventricular internal cavity size was normal in size. There is  mild left ventricular hypertrophy. Left ventricular diastolic parameters were normal. Right Ventricle: The right ventricular size is normal. No increase in right ventricular wall thickness. Right ventricular systolic function is normal. Left Atrium: Left atrial size was normal in size. Right Atrium: Right atrial size was normal in size. Pericardium: There is no evidence of  pericardial effusion. Mitral Valve: The mitral valve is normal in structure. There is mild thickening of the mitral valve leaflet(s). There is mild calcification of the mitral valve leaflet(s). Normal mobility of the mitral valve leaflets. Mild mitral annular calcification. Trivial mitral valve regurgitation. No evidence of mitral valve stenosis. Tricuspid Valve: The tricuspid valve is normal in structure. Tricuspid valve regurgitation is trivial. No evidence of tricuspid stenosis. Aortic Valve: The aortic valve is tricuspid. Aortic valve regurgitation is trivial. Mild to moderate aortic valve sclerosis/calcification is present, without any evidence of aortic stenosis. Pulmonic Valve: The pulmonic valve was not well visualized. Pulmonic valve regurgitation is trivial. No evidence of pulmonic stenosis. Aorta: The aortic root is normal in size and structure. Venous: The inferior vena cava is normal in size with greater than 50% respiratory variability, suggesting right atrial pressure of 3 mmHg. IAS/Shunts: No atrial level shunt detected  by color flow Doppler.  LEFT VENTRICLE PLAX 2D LVIDd:         4.60 cm  Diastology LVIDs:         2.90 cm  LV e' medial:   7.07 cm/s LV PW:         1.10 cm  LV E/e' medial: 10.8 LV IVS:        1.10 cm LVOT diam:     2.20 cm LV SV:         63 LV SV Index:   32 LVOT Area:     3.80 cm  RIGHT VENTRICLE RV S prime:     14.50 cm/s TAPSE (M-mode): 2.1 cm LEFT ATRIUM           Index       RIGHT ATRIUM           Index LA diam:      3.10 cm 1.55 cm/m  RA Area:     13.70 cm LA Vol (A4C): 33.7 ml 16.84 ml/m RA Volume:   26.40 ml  13.20 ml/m  AORTIC VALVE LVOT Vmax:   91.70 cm/s LVOT Vmean:  59.100 cm/s LVOT VTI:    0.167 m  AORTA Ao Root diam: 3.00 cm MITRAL VALVE MV Area (PHT): 2.87 cm    SHUNTS MV Decel Time: 264 msec    Systemic VTI:  0.17 m MV E velocity: 76.60 cm/s  Systemic Diam: 2.20 cm MV Kazandra Forstrom velocity: 72.70 cm/s MV E/Juanpablo Ciresi ratio:  1.05 Jenkins Rouge MD Electronically signed by Jenkins Rouge  MD Signature Date/Time: 05/21/2020/4:01:06 PM    Final    VAS [place] LOWER EXTREMITY VENOUS (DVT)  Result Date: 05/21/2020  Lower Venous DVTStudy Indications: Elevated Ddimer.  Risk Factors: COVID 19 positive. Comparison Study: No prior studies. Performing Technologist: Oliver Hum RVT  Examination Guidelines: Makyah Lavigne complete evaluation includes B-mode imaging, spectral Doppler, color Doppler, and power Doppler as needed of all accessible portions of each vessel. Bilateral testing is considered an integral part of Tajuanna Burnett complete examination. Limited examinations for reoccurring indications may be performed as noted. The reflux portion of the exam is performed with the patient in reverse Trendelenburg.  +---------+---------------+---------+-----------+----------+--------------+ RIGHT    CompressibilityPhasicitySpontaneityPropertiesThrombus Aging +---------+---------------+---------+-----------+----------+--------------+ CFV      Full           Yes      Yes                                 +---------+---------------+---------+-----------+----------+--------------+ SFJ      Full                                                        +---------+---------------+---------+-----------+----------+--------------+ FV Prox  Full                                                        +---------+---------------+---------+-----------+----------+--------------+ FV Mid   Full                                                        +---------+---------------+---------+-----------+----------+--------------+  FV DistalFull                                                        +---------+---------------+---------+-----------+----------+--------------+ PFV      Full                                                        +---------+---------------+---------+-----------+----------+--------------+ POP      Full           Yes      Yes                                  +---------+---------------+---------+-----------+----------+--------------+ PTV      Full                                                        +---------+---------------+---------+-----------+----------+--------------+ PERO     Full                                                        +---------+---------------+---------+-----------+----------+--------------+   +---------+---------------+---------+-----------+----------+--------------+ LEFT     CompressibilityPhasicitySpontaneityPropertiesThrombus Aging +---------+---------------+---------+-----------+----------+--------------+ CFV      Full           Yes      Yes                                 +---------+---------------+---------+-----------+----------+--------------+ SFJ      Full                                                        +---------+---------------+---------+-----------+----------+--------------+ FV Prox  Full                                                        +---------+---------------+---------+-----------+----------+--------------+ FV Mid   Full                                                        +---------+---------------+---------+-----------+----------+--------------+ FV DistalFull                                                        +---------+---------------+---------+-----------+----------+--------------+  PFV      Full                                                        +---------+---------------+---------+-----------+----------+--------------+ POP      Full           Yes      Yes                                 +---------+---------------+---------+-----------+----------+--------------+ PTV      Full                                                        +---------+---------------+---------+-----------+----------+--------------+ PERO     Full                                                         +---------+---------------+---------+-----------+----------+--------------+     Summary: RIGHT: - There is no evidence of deep vein thrombosis in the lower extremity.  - No cystic structure found in the popliteal fossa.  LEFT: - There is no evidence of deep vein thrombosis in the lower extremity.  - No cystic structure found in the popliteal fossa.  *See table(s) above for measurements and observations. Electronically signed by Monica Martinez MD on 05/21/2020 at 3:41:30 PM.    Final         Scheduled Meds: . apixaban  5 mg Oral BID  . Chlorhexidine Gluconate Cloth  6 each Topical Daily  . diclofenac Sodium  2 g Topical QID  . feeding supplement (ENSURE ENLIVE)  237 mL Oral BID BM  . fluconazole  150 mg Oral Daily  . insulin aspart  0-9 Units Subcutaneous TID WC  . insulin detemir  5 Units Subcutaneous BID  . Ipratropium-Albuterol  1 puff Inhalation QID  . mouth rinse  15 mL Mouth Rinse BID  . methylPREDNISolone (SOLU-MEDROL) injection  45 mg Intravenous Q12H  . metoprolol succinate  150 mg Oral Daily  . multivitamin with minerals  1 tablet Oral Daily  . pantoprazole  40 mg Oral BID  . sacubitril-valsartan  1 tablet Oral BID  . sodium chloride flush  10-40 mL Intracatheter Q12H  . sulfamethoxazole-trimethoprim  1 tablet Oral Daily  . valGANciclovir  900 mg Oral Daily   Continuous Infusions: . sodium chloride    . azithromycin Stopped (05/22/20 1418)  . ceFEPime (MAXIPIME) IV Stopped (05/22/20 0906)  . remdesivir 100 mg in NS 100 mL Stopped (05/22/20 1043)     LOS: 2 days    Time spent: over 30 min 40 min critical care time with AHRF requiring HHFNC  Fayrene Helper, MD [facility]   To contact the attending provider between 7A-7P or the covering provider during after hours 7P-7A, please log into the web site [email or web address] and access using universal [place] password for that web site. If you do not have the password, please call the hospital  operator.  05/22/2020, 3:57 PM

## 2020-05-22 NOTE — Consult Note (Addendum)
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 Was admitted through 8/7 and discharged to home, but was re-admitted 8/15 with worsening respiratory distress. She remained dyspneic despite 40L HFNC and PCCM was consulted 8/17.  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 Vaccinated She lives with her daughter who returned home from [place] towards the end of July and had COVID at that time. The patient developed symptoms on 7/30. She was admitted 8/1 and had a positive COVID test on 8/2. Fortunately she did not become hypoxic and remained hospitalized through the duration of remdesivir dosing. She was discharged to home on steroids 8/5.   Then on 8/15 she again presented to [facility] ED with complaints of dyspnea. O2 sats found to be 76% and she was placed on NRB and nasal cannula to reach sats > 90%. Oxygen requirements continued to increase requiring HFNC 40L with ongoing dyspnea. Remdesivir and steroids were restarted. Lasix trial. PCCM consulted for this reason 8/17.   She appears anxious and reports that she is having a "bad breathing day."She had previously reported midsternal chest pain to the hospitalist however this has improved but will recur with deep breaths. Her O2 saturations  Range from 92-98% on FIO2 100% on 40L HHFNC and NRB. She is tachypeniec up to the 19s or 50s but with distraction will slow down to upper 20s.   Past Medical History   has a past medical history of Atrial fibrillation ([place]), Cystic breast, Depression, Diffuse cystic mastopathy, Dyslipidemia (high LDL; low HDL), Hypertension, Iron deficiency anemia, unspecified, Lymphadenopathy of  head and neck (04/05/2015), Medical non-compliance, Mitral regurgitation (02/2017), and Persistent atrial fibrillation with rapid ventricular response ([place]) (07/15/2017).   Significant Hospital Events   8/1 >8/5 admit for COVID 19 8/15 re-admitted with worsening hypoxia 8/17 PCCM consult.   Consults:  PCCM  Procedures:    Significant Diagnostic Tests:    Micro Data:    Antimicrobials:  Vancomycin 8/15 > 8/17 Azithromycin 8/15> Cefepime 8/15 > Bactrim 8/15 >  Interim history/subjective:    Objective   Blood pressure (!) 143/77, pulse (!) 103, temperature 98.2 F (36.8 C), temperature source Axillary, resp. rate (!) 40, height 5\' 7"  (1.702 m), weight 77.7 kg, last menstrual period 02/03/2017, SpO2 (!) 89 %.    FiO2 (%):  [80 %-100 %] 100 %   Intake/Output Summary (Last 24 hours) at 05/22/2020 1939 Last data filed at 05/22/2020 1800 Gross per 24 hour  Intake 649.95 ml  Output 2300 ml  Net -1650.05 ml   Filed Weights   05/13/2020 1003 05/28/2020 1052 05/22/20 0539  Weight: 88 kg 88.5 kg 77.7 kg    Physical Exam: General: Well-appearing, tachypneic but not in acute distress HENT: [place], AT, OP clear, MMM, HHFNC and NRB in place Eyes: EOMI, no scleral icterus Respiratory: Diminished bibasilar breath sounds, no wheezing Cardiovascular: RRR, -M/R/G, no JVD GI: BS+, soft, nontender Extremities:-Edema,-tenderness Neuro: AAO x4, CNII-XII grossly intact Psych: Anxious mood, normal affect GU: Purewick in place  CXR 8/17 reviewed and interpreted personally by me with increased bibasilar opacification, no effusion or edema. Mild progression of pneumonia  Resolved Hospital  Problem list     Assessment & Plan:   Acute hypoxemic respiratory failure secondary to COVID-19 pneumonia - Continue HFNC 40LPM, increase as needed for WOB and O2 sats > 85% - Trial BiPAP as needed for work of breathing if increase flow does not help - Continue solumedrol to 1mg /kg daily - Agree with  diuresis with goal for net even - Oncology has given the OK to use Baricitinib if desired. Would recommend.  - Low threshold for intubation should she develop alteration in mental status, clear signs of respiratory distress, or sustained O2 sats < 85% - Self-proning as tolerated  - Pulmonary hygiene including bronchodilator and flutter valve. AVOID manual percussion. - Mobilize as tolerated - Trend CRP, d-dimer and ferritin  Anxiety Likely contributing to her respiratory distress - PRN anxiolytics per primary  Chest pain, suspect non-cardiac in origin EKG reviewed with TWI, no significant ST changes. Initial troponin negative. Recent echo reviewed with normal EF, no WMA or valvular abnormalities - Trend troponin  Sepsis secondary to COVID-19 - Continue broad spectrum antibiotics for CAP for co-comitant bacterial pneumonia - F/u blood cultures   Paroxysmal Atrial fibrillation Currently in NSR - Telemetry - On apixaban  Pulmonary available as needed. No indication for intubation at this point. Continue to provide supportive care with increased O2 flow and BiPAP. Please re-consult via consult pager on amion.  Best practice:  Diet: regular Pain/Anxiety/Delirium protocol (if indicated): NA VAP protocol (if indicated): NA DVT prophylaxis: Eliquis (AF) GI prophylaxis: NA Glucose control: Per primary Mobility: BR Code Status: FULL Family Communication: patient updated bedside Disposition: Remain in ICU  Labs   CBC: Recent Labs  Lab 05/14/2020 1014 05/21/20 0545 05/22/20 0525  WBC 10.8* 9.6 12.4*  NEUTROABS 2.1 2.5 2.9  HGB 11.5* 11.3* 11.6*  HCT 35.2* 35.2* 36.0  MCV 86.9 87.8 88.5  PLT 237 213 976    Basic Metabolic Panel: Recent Labs  Lab 05/31/2020 1014 05/21/20 0545 05/22/20 0525  NA 139 144 142  K 3.8 3.4* 4.2  CL 106 108 113*  CO2 22 21* 18*  GLUCOSE 129* 155* 283*  BUN 15 17 23*  CREATININE 0.75 0.62 0.64  CALCIUM 8.2* 8.6* 8.5*   GFR: Estimated  Creatinine Clearance: 81.3 mL/min (by C-G formula based on SCr of 0.64 mg/dL). Recent Labs  Lab 05/15/2020 1014 05/22/2020 1110 05/26/2020 1523 05/21/20 0545 05/22/20 0525  PROCALCITON  --  0.41  --  0.87 0.57  WBC 10.8*  --   --  9.6 12.4*  LATICACIDVEN 2.0*  --  1.0  --   --     Liver Function Tests: Recent Labs  Lab 05/08/2020 1014 05/21/20 0545 05/22/20 0525  AST 54* 35 25  ALT 59* 48* 39  ALKPHOS 46 43 49  BILITOT 0.9 1.2 0.8  PROT 6.3* 6.2* 6.1*  ALBUMIN 2.6* 2.7* 2.5*   No results for input(s): LIPASE, AMYLASE in the last 168 hours. No results for input(s): AMMONIA in the last 168 hours.  ABG    Component Value Date/Time   TCO2 22 03/25/2012 1833     Coagulation Profile: Recent Labs  Lab 05/16/2020 1014  INR 1.7*    Cardiac Enzymes: No results for input(s): CKTOTAL, CKMB, CKMBINDEX, TROPONINI in the last 168 hours.  HbA1C: Hgb A1c MFr Bld  Date/Time Value Ref Range Status  05/11/2020 03:45 AM 5.7 (H) 4.8 - 5.6 % Final    Comment:    (NOTE) Pre diabetes:  5.7%-6.4%  Diabetes:              >6.4%  Glycemic control for   <7.0% adults with diabetes     CBG: Recent Labs  Lab 05/21/20 1535 05/21/20 2109 05/22/20 0814 05/22/20 1154 05/22/20 1635  GLUCAP 269* 147* 235* 166* 215*    Review of Systems:   Shortness of breath, chest pain  Past Medical History  She,  has a past medical history of Atrial fibrillation ([place]), Cystic breast, Depression, Diffuse cystic mastopathy, Dyslipidemia (high LDL; low HDL), Hypertension, Iron deficiency anemia, unspecified, Lymphadenopathy of head and neck (04/05/2015), Medical non-compliance, Mitral regurgitation (02/2017), and Persistent atrial fibrillation with rapid ventricular response ([place]) (07/15/2017).   Surgical History    Past Surgical History:  Procedure Laterality Date  . BIOPSY  10/17/2019   Procedure: BIOPSY;  Surgeon: Jerene Bears, MD;  Location: [facility] END[facility];  Service: Endoscopy;;  .  CARDIOVERSION N/A 04/02/2017   Procedure: CARDIOVERSION;  Surgeon: Dorothy Spark, MD;  Location: [facility] ENDOSCOPY;  Service: Cardiovascular;  Laterality: N/A;  . CARDIOVERSION N/A 08/19/2017   Procedure: CARDIOVERSION;  Surgeon: Larey Dresser, MD;  Location[facility] ENDOSCOPY;  Service: Cardiovascular;  Laterality: N/A;  . ESOPHAGOGASTRODUODENOSCOPY (EGD) WITH PROPOFOL N/A 10/17/2019   Procedure: ESOPHAGOGASTRODUODENOSCOPY (EGD) WITH PROPOFOL;  Surgeon: Jerene Bears, MD;  Location: [facility] ENDOSCOPY;  Service: Endoscopy;  Laterality: N/A;  . IR IMAGING GUIDED PORT INSERTION  06/29/2018  . TUBAL LIGATION       Social History   reports that she has never smoked. She has never used smokeless tobacco. She reports that she does not drink alcohol and does not use drugs.   Family History   Her family history includes Heart disease in her brother and maternal grandmother; Kidney disease in her father; Stomach cancer in her maternal aunt and paternal grandmother; Sudden death in her mother.   Allergies No Known Allergies   Home Medications  Prior to Admission medications   Medication Sig Start Date End Date Taking? Authorizing Provider  acetaminophen (TYLENOL) 325 MG tablet Take 325-650 mg by mouth every 6 (six) hours as needed for mild pain, moderate pain or fever.    Yes [provider]  Cholecalciferol (VITAMIN D-3) 1000 units CAPS Take 1,000 Units by mouth daily with breakfast.   Yes [provider]  ELIQUIS 5 MG TABS tablet TAKE 1 TABLET BY MOUTH TWICE A DAY Patient taking differently: Take 5 mg by mouth 2 (two) times daily.  04/30/20  Yes Camnitz, Will Hassell Done, MD  fluconazole (DIFLUCAN) 150 MG tablet Take 1 tablet (150 mg total) by mouth daily. 05/13/20  Yes Patrecia Pour, MD  lidocaine-prilocaine (EMLA) cream Apply 1 application topically as needed. Patient taking differently: Apply 1 application topically daily as needed (access port).  06/04/18  Yes Gorsuch, Ni, MD  metoprolol  succinate (TOPROL-XL) 100 MG 24 hr tablet TAKE 1 AND 1/2 TABLETS BY MOUTH EVERY DAY. TAKE WITH FOOD OR RIGHT AFTER A MEAL Patient taking differently: Take 150 mg by mouth daily.  08/05/19  Yes Camnitz, Will Hassell Done, MD  ondansetron (ZOFRAN-ODT) 8 MG disintegrating tablet Take 1 tablet (8 mg total) by mouth every 8 (eight) hours as needed for nausea or vomiting. 10/07/19  Yes Jaynee Eagles, PA-C  pantoprazole (PROTONIX) 40 MG tablet TAKE 1 TABLET BY MOUTH TWICE A DAY Patient taking differently: Take 40 mg by mouth 2 (two) times daily.  01/12/20  Yes Heath Lark, MD  sacubitril-valsartan (ENTRESTO) 49-51 MG Take 1  tablet by mouth 2 (two) times daily. 03/26/20  Yes Camnitz, Will Hassell Done, MD  sulfamethoxazole-trimethoprim (BACTRIM) 400-80 MG tablet TAKE 1 TABLET BY MOUTH DAILY. 03/28/20  Yes Gorsuch, Ni, MD  valGANciclovir (VALCYTE) 450 MG tablet TAKE 2 TABLETS (900 MG TOTAL) BY MOUTH DAILY. 03/28/20  Yes Gorsuch, Ni, MD  lenalidomide (REVLIMID) 10 MG capsule Take 1 capsule (10 mg total) by mouth daily. hold this medication until oncology follow up Patient not taking: Reported on 05/19/2020 05/12/20   Patrecia Pour, MD         The patient is critically ill with multiple organ systems failure and requires high complexity decision making for assessment and support, frequent evaluation and titration of therapies, application of advanced monitoring technologies and extensive interpretation of multiple databases.  Independent Critical Care Time: 94 Minutes.   Rodman Pickle, M.D. [facility] Pulmonary/Critical Care Medicine 05/22/2020 8:56 PM   Please see Amion for pager number to reach on-call Pulmonary and Critical Care Team.

## 2020-05-22 NOTE — Progress Notes (Signed)
Text paged Dr. Florene Glen per Ander Purpura RN request, pt looking more in resp distress, complaining of mid- sternal pain. Lauren RN currently at patient's bedside monitoring. Awaiting call back from MD.

## 2020-05-22 NOTE — Progress Notes (Signed)
Initial Nutrition Assessment  RD working remotely.  DOCUMENTATION CODES:   Not applicable  INTERVENTION:  - will order Hormel Shake with breakfast and dinner meals, each supplement provides 500 kcal and 22 grams protein. - will order 1 tablet multivitamin with minerals/day. - will complete NFPE when able.  - if PO intakes remain poor, highly recommend small bore NGT placement and initiation of TF given likely malnutrition and catabolic illness d/t COVID and malignancy.  NUTRITION DIAGNOSIS:   Increased nutrient needs related to acute illness, catabolic illness as evidenced by estimated needs.  GOAL:   Patient will meet greater than or equal to 90% of their needs  MONITOR:   PO intake, Supplement acceptance, Labs, Weight trends  REASON FOR ASSESSMENT:   Malnutrition Screening Tool  ASSESSMENT:   59 y.o. 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 She was discharged from the hospital on 8/7 after treatment for COVID-19. She has not been vacinnated. Patient presented to the ED with SOB, fever, and ongoing productive cough.  Patient ate 10% of dinner last night. Weight today documented as 171 lb and weight on 7/21 was 194 lb. This indicates 23 lb weight loss (12% body weight) in the past 1 month; significant for time frame. Will monitor weight trends closely.  Per notes: - acute respiratory failure with hypoxia 2/2 COVID-19 PNA - odynophagia--s/p 14 days of treatment for candidal esophagitis - CLL with chemo currently on hold   Labs reviewed; CBG: 235 mg/dl, Cl: 113 mmol/l, BUN: 23 mg/dl. Medications reviewed; sliding scale novolog, 5 units levemir BID, 45 mg solu-medrol BID, 40 mg oral protonix BID, 100 mg IV remdesivir x1 dose/day (8/16-8/19).    NUTRITION - FOCUSED PHYSICAL EXAM:  unable to complete at this time.   Diet Order:   Diet Order            Diet regular  Room service appropriate? Yes; Fluid consistency: Thin  Diet effective now                 EDUCATION NEEDS:   No education needs have been identified at this time  Skin:  Skin Assessment: Reviewed RN Assessment  Last BM:  8/16  Height:   Ht Readings from Last 1 Encounters:  05/23/2020 5\' 7"  (1.702 m)    Weight:   Wt Readings from Last 1 Encounters:  05/22/20 77.7 kg    Estimated Nutritional Needs:  Kcal:  1925-2150 kcal Protein:  100-115 grams Fluid:  >/= 2.1 L/day     Jarome Matin, MS, RD, LDN, CNSC Inpatient Clinical Dietitian RD pager # available in AMION  After hours/weekend pager # available in [facility]

## 2020-05-22 NOTE — Progress Notes (Signed)
Called to bedside for pt with more resp distress  Pt c/o SOB, pain "all over".  Notes substernal chest discomfort.    Vitals:   05/22/20 1737 05/22/20 1800  BP:  (!) 164/88  Pulse:  95  Resp:  (!) 45  Temp: 98.2 F (36.8 C)   SpO2:  92%  Satting in 90's on 40 L HHFNC and NRB  Appears uncomfortable Shallow breaths, no clear adventitious lung sounds - RR in 60's Tachycardic, regular - mild discomfort with palpation of chest S/nt/nd No LEE  Audrey Gross Acute hypoxic resp failure 2/2 covid pneumonia  Resp Distress: Continue steroids, remdesivir, abx Discussed risk/benefits of baricitinib (infection, malignancy, vte - discussed with husband as pt became too anxious to discuss - agreeable to use) - discussed with Dr. Alvy Bimler given recent chemotherapy, who was ok with use in setting of her AHRF Follow ABG, CXR.   Discussed with PCCM who will come by to see her, will trial increase flow rate  Chest Discomfort Follow troponins, CXR, EKG Suspect related to above Morphine prn

## 2020-05-23 LAB — COMPREHENSIVE METABOLIC PANEL
ALT: 29 U/L (ref 0–44)
AST: 20 U/L (ref 15–41)
Albumin: 2.4 g/dL — ABNORMAL LOW (ref 3.5–5.0)
Alkaline Phosphatase: 51 U/L (ref 38–126)
Anion gap: 14 (ref 5–15)
BUN: 28 mg/dL — ABNORMAL HIGH (ref 6–20)
CO2: 20 mmol/L — ABNORMAL LOW (ref 22–32)
Calcium: 8.5 mg/dL — ABNORMAL LOW (ref 8.9–10.3)
Chloride: 108 mmol/L (ref 98–111)
Creatinine, Ser: 0.63 mg/dL (ref 0.44–1.00)
GFR calc Af Amer: >60 mL/min (ref >60)
GFR calc non Af Amer: >60 mL/min (ref >60)
Glucose, Bld: 232 mg/dL — ABNORMAL HIGH (ref 70–99)
Potassium: 3.6 mmol/L (ref 3.5–5.1)
Sodium: 142 mmol/L (ref 135–145)
Total Bilirubin: 0.7 mg/dL (ref 0.3–1.2)
Total Protein: 5.9 g/dL — ABNORMAL LOW (ref 6.5–8.1)

## 2020-05-23 LAB — CBC WITH DIFFERENTIAL/PLATELET
Abs Immature Granulocytes: 0.14 10*3/uL — ABNORMAL HIGH (ref 0.00–0.07)
Basophils Absolute: 0.1 10*3/uL (ref 0.0–0.1)
Basophils Relative: 1 %
Eosinophils Absolute: 0 10*3/uL (ref 0.0–0.5)
Eosinophils Relative: 0 %
HCT: 34.9 % — ABNORMAL LOW (ref 36.0–46.0)
Hemoglobin: 11.2 g/dL — ABNORMAL LOW (ref 12.0–15.0)
Immature Granulocytes: 1 %
Lymphocytes Relative: 53 %
Lymphs Abs: 5.2 10*3/uL — ABNORMAL HIGH (ref 0.7–4.0)
MCH: 28.2 pg (ref 26.0–34.0)
MCHC: 32.1 g/dL (ref 30.0–36.0)
MCV: 87.9 fL (ref 80.0–100.0)
Monocytes Absolute: 2.8 10*3/uL — ABNORMAL HIGH (ref 0.1–1.0)
Monocytes Relative: 29 %
Neutro Abs: 1.6 10*3/uL — ABNORMAL LOW (ref 1.7–7.7)
Neutrophils Relative %: 16 %
Platelets: 189 10*3/uL (ref 150–400)
RBC: 3.97 MIL/uL (ref 3.87–5.11)
RDW: 14.7 % (ref 11.5–15.5)
WBC: 9.8 10*3/uL (ref 4.0–10.5)
nRBC: 0 % (ref 0.0–0.2)

## 2020-05-23 LAB — GLUCOSE, CAPILLARY
Glucose-Capillary: 139 mg/dL — ABNORMAL HIGH (ref 70–99)
Glucose-Capillary: 160 mg/dL — ABNORMAL HIGH (ref 70–99)
Glucose-Capillary: 238 mg/dL — ABNORMAL HIGH (ref 70–99)
Glucose-Capillary: 215 mg/dL — ABNORMAL HIGH (ref 70–99)

## 2020-05-23 LAB — D-DIMER, QUANTITATIVE: D-Dimer, Quant: 6.56 ug/mL-FEU — ABNORMAL HIGH (ref 0.00–0.50)

## 2020-05-23 LAB — C-REACTIVE PROTEIN: CRP: 10.4 mg/dL — ABNORMAL HIGH (ref <1.0)

## 2020-05-23 LAB — PROCALCITONIN: Procalcitonin: 0.45 ng/mL

## 2020-05-23 MED ORDER — POTASSIUM CHLORIDE CRYS ER 20 MEQ PO TBCR
40.0000 meq | EXTENDED_RELEASE_TABLET | Freq: Once | ORAL | Status: AC
  Administered 2020-05-23: 40 meq via ORAL
  Filled 2020-05-23: qty 2

## 2020-05-23 MED ORDER — LORAZEPAM 2 MG/ML IJ SOLN
0.5000 mg | Freq: Once | INTRAMUSCULAR | Status: AC
  Administered 2020-05-23: 0.5 mg via INTRAVENOUS
  Filled 2020-05-23: qty 1

## 2020-05-23 MED ORDER — FUROSEMIDE 10 MG/ML IJ SOLN
40.0000 mg | Freq: Once | INTRAMUSCULAR | Status: AC
  Administered 2020-05-23: 40 mg via INTRAVENOUS
  Filled 2020-05-23: qty 4

## 2020-05-23 MED ORDER — SALINE SPRAY 0.65 % NA SOLN
1.0000 | NASAL | Status: DC | PRN
  Administered 2020-05-24 – 2020-06-04 (×6): 1 via NASAL
  Filled 2020-05-23: qty 44

## 2020-05-23 MED ORDER — ENOXAPARIN SODIUM 80 MG/0.8ML ~~LOC~~ SOLN
75.0000 mg | Freq: Two times a day (BID) | SUBCUTANEOUS | Status: DC
  Administered 2020-05-23 – 2020-06-05 (×27): 75 mg via SUBCUTANEOUS
  Filled 2020-05-23 (×29): qty 0.75

## 2020-05-23 NOTE — Progress Notes (Signed)
ANTICOAGULATION CONSULT NOTE - Initial Consult  Pharmacy Consult for Lovenox Indication: atrial fibrillation  No Known Allergies  Patient Measurements: Height: 5\' 7"  (170.2 cm) Weight: 76.6 kg (168 lb 14 oz) IBW/kg (Calculated) : 61.6 Heparin Dosing Weight:   Vital Signs: Temp: 98.9 F (37.2 C) (08/18 0800) Temp Source: Axillary (08/18 0800) BP: 146/86 (08/18 1200) Pulse Rate: 63 (08/18 1200)  Labs: Recent Labs    05/21/20 0545 05/21/20 0545 05/21/20 1214 05/21/20 1344 05/22/20 0525 05/22/20 1915 05/22/20 2052 05/23/20 0349  HGB 11.3*   < >  --   --  11.6*  --   --  11.2*  HCT 35.2*  --   --   --  36.0  --   --  34.9*  PLT 213  --   --   --  240  --   --  189  CREATININE 0.62  --   --   --  0.64  --   --  0.63  TROPONINIHS  --   --    < > 8  --  9 9  --    < > = values in this interval not displayed.    Estimated Creatinine Clearance: 80.8 mL/min (by C-G formula based on SCr of 0.63 mg/dL).   Medical History: Past Medical History:  Diagnosis Date  . Atrial fibrillation ([place])   . Cystic breast   . Depression   . Diffuse cystic mastopathy   . Dyslipidemia (high LDL; low HDL)   . Hypertension   . Iron deficiency anemia, unspecified   . Lymphadenopathy of head and neck 04/05/2015  . Medical non-compliance   . Mitral regurgitation 02/2017   moderate to severe  . Persistent atrial fibrillation with rapid ventricular response ([place]) 07/15/2017    Medications:  Scheduled:  . baricitinib  4 mg Oral Daily  . Chlorhexidine Gluconate Cloth  6 each Topical Daily  . diclofenac Sodium  2 g Topical QID  . enoxaparin (LOVENOX) injection  75 mg Subcutaneous Q12H  . feeding supplement (ENSURE ENLIVE)  237 mL Oral BID BM  . fluconazole  150 mg Oral Daily  . insulin aspart  0-9 Units Subcutaneous TID WC  . insulin detemir  5 Units Subcutaneous BID  . Ipratropium-Albuterol  1 puff Inhalation QID  . mouth rinse  15 mL Mouth Rinse BID  . methylPREDNISolone (SOLU-MEDROL)  injection  45 mg Intravenous Q12H  . metoprolol succinate  150 mg Oral Daily  . multivitamin with minerals  1 tablet Oral Daily  . pantoprazole  40 mg Oral BID  . sacubitril-valsartan  1 tablet Oral BID  . sodium chloride flush  10-40 mL Intracatheter Q12H  . sulfamethoxazole-trimethoprim  1 tablet Oral Daily  . valGANciclovir  900 mg Oral Daily   Infusions:  . sodium chloride    . azithromycin 500 mg (05/23/20 1309)  . ceFEPime (MAXIPIME) IV Stopped (05/23/20 6301)  . remdesivir 100 mg in NS 100 mL Stopped (05/23/20 1044)    Assessment: 9 yoF re-admitted on 8/15 with COVID-19 pneumonia.  PMH includes Afib on Eliquis prior to admission.  Pharmacy is now consulted to dose Lovenox. 8/16 LE Dopplers: negative for DVT Most recent Eliquis dose on 8/18 at 10:15. SCr 0.6 CBC: Hgb 11.2, Plt 189 D-dimer significantly increased up to 6.56  Goal of Therapy:  Anti-Xa level 0.6-1 units/ml 4hrs after LMWH dose given Monitor platelets by anticoagulation protocol: Yes   Plan:  Lovenox 1 mg/kg [place] q12h - first dose on 8/18  at 2200 Monitor renal function, CBC, D-dimer, imaging, weight, s/s bleeding/thrombosis.  Gretta Arab PharmD, BCPS Clinical Pharmacist [facility] main pharmacy [phone] 05/23/2020 1:32 PM

## 2020-05-23 NOTE — Progress Notes (Addendum)
PROGRESS NOTE    Audrey Gross  TMH:[id number] DOB: [birth year] DOA: 05/23/2020 PCP: Janith Lima, MD   Chief Complaint  Patient presents with  . COVID Positive    Brief Narrative:  Audrey Gross is a 59 y.o. 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 with her last treatment about 2 weeks prior to admission, paroxysmal atrial fibrillation on Eliquis, obesity history of nonischemic cardiomyopathy with an EF of 35% back in 2019 repeated 2D echo that showed an improvement in ejection fraction to 50%, MR, she has not been vaccinated recently discharged from the hospital on 05/12/2020 and treated for COVID-19 infection she went home off oxygen comes in again for shortness of breath that started about 5 days prior to admission.  She was readmitted with acute hypoxic respiratory failure after recent discharge after treatment for COVID infection.   Assessment & Plan:   Acute respiratory failure with hypoxia secondary to pneumonia due to COVID-19: Unvaccinated Tested positive on 8/2 S/p steroids/remdesivir course during last hospital admission (completed 10 days of steroids) Patient continues to require heated high flow oxygen at 40 L/min, 100% FiO2. Seen by pulmonology yesterday.  They recommend continuing current level of care.  Patient was started back on Remdesivir for another 5-day course.  Patient also started back on Solu-Medrol. Due to severity of her illness patient was started on baricitinib. She was noted to have mildly elevated procalcitonin level and was started on broad-spectrum antibiotics with vancomycin cefepime and azithromycin.  Vancomycin was discontinued due to negative MRSA PCR. Plan to continue antibacterials for 5 days. Due to elevated BNP patient was also given diuretics.  Keep in negative fluid balance. Incentive spirometry, mobilization as much as possible.  Prone positioning as much as possible. D-dimer noted to be 6.56 this morning.  Patient however is already  anticoagulated with Eliquis.  She had lower extremity ultrasound recently which was negative for DVT.  Continue to trend D-dimers.  An argument could be made for changing her from Eliquis to full dose Lovenox. CRP noted to be elevated but improving.  COVID-19 Labs  Recent Labs    05/21/20 0545 05/22/20 0525 05/22/20 0526 05/23/20 0349  DDIMER 1.81* 3.31*  --  6.56*  CRP 25.8*  --  16.0* 10.4*    Elevated Troponin:  Elevated likely secondary to Covid.  Low suspicion for ACS.  EKG from admission showed a baseline wander and was poor quality.  Troponins are now normal.  Echocardiogram shows normal EF with no wall motion abnormalities.  Do not anticipate any further work-up at this time.  Normal anion gap metabolic acidosis  Stable.  Continue to monitor.    Paroxysmal atrial fibrillation: Continue metoprolol.  Anticoagulated.  Poor PO intake Encourage oral intake.  Mobilize.    Odynophagia Looks like she was being treated empirically for 14 days for candidal esophagitis.  Will continue here.  CLL (chronic lymphocytic leukemia) Noted will have to hold chemotherapy. Continue bactrim and valganciclovir  Nonischemic cardiomyopathy (HCC)  Chronic systolic heart failure (HCC) Continue metoprolol, entresto.  Echocardiogram shows normal systolic function.  Transaminitis: Likely due to COVID-19.  Now normal.    Hypokalemia Replete.  Hyperglycemia Likely due to steroids.  HbA1c 5.7.  DVT prophylaxis: eliquis Code Status: full  Family Communication: No family at bedside Disposition: Hopefully return home when improved.  Status is: Inpatient  Remains inpatient appropriate because:IV treatments appropriate due to intensity of illness or inability to take PO and Inpatient level of care appropriate due to  severity of illness   Dispo: The patient is from: Home              Anticipated d/c is to: Home              Anticipated d/c date is: > 3 days              Patient  currently is not medically stable to d/c.     Consultants:   PCCM  Procedures:   none  Antimicrobials:  Anti-infectives (From admission, onward)   Start     Dose/Rate Route Frequency Ordered Stop   05/21/20 1300  sulfamethoxazole-trimethoprim (BACTRIM) 400-80 MG per tablet 1 tablet     Discontinue     1 tablet Oral Daily 05/21/20 1147     05/21/20 1000  remdesivir 100 mg in sodium chloride 0.9 % 100 mL IVPB     Discontinue    "Followed by" Linked Group Details   100 mg 200 mL/hr over 30 Minutes Intravenous Daily 05/27/2020 1333 05/25/20 0959   05/21/20 0900  ceFEPIme (MAXIPIME) 2 g in sodium chloride 0.9 % 100 mL IVPB     Discontinue     2 g 200 mL/hr over 30 Minutes Intravenous Every 8 hours 05/21/20 0832     05/08/2020 2200  valGANciclovir (VALCYTE) 450 MG tablet TABS 900 mg     Discontinue     900 mg Oral Daily 06/04/2020 1421     05/13/2020 2100  vancomycin (VANCOCIN) IVPB 1000 mg/200 mL premix  Status:  Discontinued        1,000 mg 200 mL/hr over 60 Minutes Intravenous Every 8 hours 05/13/2020 1239 05/21/20 1055   06/05/2020 1430  fluconazole (DIFLUCAN) tablet 150 mg     Discontinue     150 mg Oral Daily 05/30/2020 1421 05/26/20 2359   05/13/2020 1430  cefTRIAXone (ROCEPHIN) 2 g in sodium chloride 0.9 % 100 mL IVPB  Status:  Discontinued        2 g 200 mL/hr over 30 Minutes Intravenous Every 24 hours 05/26/2020 1421 05/21/20 0815   05/24/2020 1400  azithromycin (ZITHROMAX) 500 mg in sodium chloride 0.9 % 250 mL IVPB     Discontinue     500 mg 250 mL/hr over 60 Minutes Intravenous Every 24 hours 05/11/2020 1337 05/25/20 1359   05/18/2020 1333  remdesivir 200 mg in sodium chloride 0.9% 250 mL IVPB       "Followed by" Linked Group Details   200 mg 580 mL/hr over 30 Minutes Intravenous Once 05/16/2020 1333 05/16/2020 1614   06/03/2020 1245  vancomycin (VANCOREADY) IVPB 1500 mg/300 mL        1,500 mg 150 mL/hr over 120 Minutes Intravenous  Once 05/15/2020 1239 05/13/2020 1812   05/19/2020 1215  ceFEPIme  (MAXIPIME) 2 g in sodium chloride 0.9 % 100 mL IVPB        2 g 200 mL/hr over 30 Minutes Intravenous  Once 05/11/2020 1213 05/31/2020 1350     Subjective: Patient complains of difficulty breathing.  No chest pain.  No nausea vomiting.  Objective: Vitals:   05/23/20 0815 05/23/20 0900 05/23/20 1000 05/23/20 1100  BP:  (!) 145/81 (!) 141/86 (!) 147/81  Pulse:  76 77 72  Resp:  (!) 40 (!) 36 (!) 38  Temp:      TempSrc:      SpO2: 98% 96% 94% 99%  Weight:      Height:        Intake/Output Summary (Last  24 hours) at 05/23/2020 1233 Last data filed at 05/23/2020 1051 Gross per 24 hour  Intake 886.54 ml  Output 2000 ml  Net -1113.46 ml   Filed Weights   05/22/20 0539 05/23/20 0009 05/23/20 0439  Weight: 77.7 kg 76.6 kg 76.6 kg    Examination:  General appearance: Awake alert.  In no distress Resp: Tachypneic at rest.  Some use of accessory muscles noted.  Coarse breath sounds with crackles at the bases.  No wheezing or rhonchi. Cardio: S1-S2 is normal regular.  No S3-S4.  No rubs murmurs or bruit GI: Abdomen is soft.  Nontender nondistended.  Bowel sounds are present normal.  No masses organomegaly Extremities: No edema.  Full range of motion of lower extremities. Neurologic:  No focal neurological deficits.     Data Reviewed: I have personally reviewed following labs and imaging studies  CBC: Recent Labs  Lab 06/01/2020 1014 05/21/20 0545 05/22/20 0525 05/23/20 0349  WBC 10.8* 9.6 12.4* 9.8  NEUTROABS 2.1 2.5 2.9 1.6*  HGB 11.5* 11.3* 11.6* 11.2*  HCT 35.2* 35.2* 36.0 34.9*  MCV 86.9 87.8 88.5 87.9  PLT 237 213 240 081    Basic Metabolic Panel: Recent Labs  Lab 05/16/2020 1014 05/21/20 0545 05/22/20 0525 05/22/20 1915 05/23/20 0349  NA 139 144 142  --  142  K 3.8 3.4* 4.2  --  3.6  CL 106 108 113*  --  108  CO2 22 21* 18*  --  20*  GLUCOSE 129* 155* 283*  --  232*  BUN 15 17 23*  --  28*  CREATININE 0.75 0.62 0.64  --  0.63  CALCIUM 8.2* 8.6* 8.5*  --  8.5*   MG  --   --   --  2.2  --     GFR: Estimated Creatinine Clearance: 80.8 mL/min (by C-G formula based on SCr of 0.63 mg/dL).  Liver Function Tests: Recent Labs  Lab 05/21/2020 1014 05/21/20 0545 05/22/20 0525 05/23/20 0349  AST 54* 35 25 20  ALT 59* 48* 39 29  ALKPHOS 46 43 49 51  BILITOT 0.9 1.2 0.8 0.7  PROT 6.3* 6.2* 6.1* 5.9*  ALBUMIN 2.6* 2.7* 2.5* 2.4*    CBG: Recent Labs  Lab 05/22/20 1154 05/22/20 1635 05/22/20 2202 05/23/20 0828 05/23/20 1217  GLUCAP 166* 215* 162* 139* 160*     Recent Results (from the past 240 hour(s))  Culture, blood (Routine x 2)     Status: None (Preliminary result)   Collection Time: 05/25/2020 10:14 AM   Specimen: BLOOD  Result Value Ref Range Status   Specimen Description   Final    BLOOD PORTA CATH Performed at [facility], [place] [address], [place], [place] [address]    Special Requests   Final    BOTTLES DRAWN AEROBIC AND ANAEROBIC Blood Culture adequate volume Performed at [place] [address], [place], [place] [address]    Culture   Final    NO GROWTH 3 DAYS Performed at [facility], [place] [address] [place], [place] [address]    Report Status PENDING  Incomplete  MRSA PCR Screening     Status: None   Collection Time: 05/21/20  4:32 AM   Specimen: Nasal Mucosa; Nasopharyngeal  Result Value Ref Range Status   MRSA by PCR NEGATIVE NEGATIVE Final    Comment:        The GeneXpert MRSA Assay (FDA approved for NASAL specimens only), is one component of  a comprehensive MRSA colonization surveillance program. It is not intended to diagnose MRSA infection nor to guide or monitor treatment for MRSA infections. Performed at [facility], [place] [address] [place], [place] [address]          Radiology Studies: DG CHEST PORT 1 VIEW  Result Date: 05/22/2020 CLINICAL DATA:  COVID positive with increasing shortness of breath. EXAM: PORTABLE CHEST 1 VIEW  COMPARISON:  Radiograph 05/13/2020.  CT 04/16/2020 FINDINGS: Right chest port remains in place. Patchy bilateral heterogeneous airspace opacities with mild progression over the past 2 days. No pneumothorax or pleural effusion. Stable heart size and mediastinal contours. Stable osseous structures. IMPRESSION: Patchy bilateral heterogeneous airspace opacities with mild progression over the past 2 days, pattern consistent with COVID pneumonia. Electronically Signed   By: Keith Rake M.D.   On: 05/22/2020 20:10   ECHOCARDIOGRAM COMPLETE  Result Date: 05/21/2020    ECHOCARDIOGRAM REPORT   Patient Name:   BENTLEIGH WAREN Date of Exam: 05/21/2020 Medical Rec #:  [id number]     Height:       67.0 in Accession #:    [id number]    Weight:       195.0 lb Date of Birth:  Jul 28, 1961     BSA:          2.001 m Patient Age:    27 years      BP:           160/129 mmHg Patient Gender: F             HR:           58 bpm. Exam Location:  Inpatient Procedure: 2D Echo Indications:    CHF 428  History:        Patient has prior history of Echocardiogram examinations, most                 recent 01/31/2019. Arrythmias:Atrial Fibrillation; Risk                 Factors:Hypertension and Dyslipidemia. Hx of cardioversion.                 covid +.  Sonographer:    Mikki Santee RDCS (AE) Referring Phys: [phone] A CALDWELL POWELL JR  Sonographer Comments: Image acquisition challenging due to respiratory motion. patient on Bi-Pap during exam IMPRESSIONS  1. Left ventricular ejection fraction, by estimation, is 60 to 65%. The left ventricle has normal function. The left ventricle has no regional wall motion abnormalities. There is mild left ventricular hypertrophy. Left ventricular diastolic parameters were normal.  2. Right ventricular systolic function is normal. The right ventricular size is normal.  3. The mitral valve is normal in structure. Trivial mitral valve regurgitation. No evidence of mitral stenosis.  4. The aortic valve is  tricuspid. Aortic valve regurgitation is trivial. Mild to moderate aortic valve sclerosis/calcification is present, without any evidence of aortic stenosis.  5. The inferior vena cava is normal in size with greater than 50% respiratory variability, suggesting right atrial pressure of 3 mmHg. FINDINGS  Left Ventricle: Left ventricular ejection fraction, by estimation, is 60 to 65%. The left ventricle has normal function. The left ventricle has no regional wall motion abnormalities. The left ventricular internal cavity size was normal in size. There is  mild left ventricular hypertrophy. Left ventricular diastolic parameters were normal. Right Ventricle: The right ventricular size is normal. No increase in right ventricular wall thickness. Right ventricular systolic function is normal. Left  Atrium: Left atrial size was normal in size. Right Atrium: Right atrial size was normal in size. Pericardium: There is no evidence of pericardial effusion. Mitral Valve: The mitral valve is normal in structure. There is mild thickening of the mitral valve leaflet(s). There is mild calcification of the mitral valve leaflet(s). Normal mobility of the mitral valve leaflets. Mild mitral annular calcification. Trivial mitral valve regurgitation. No evidence of mitral valve stenosis. Tricuspid Valve: The tricuspid valve is normal in structure. Tricuspid valve regurgitation is trivial. No evidence of tricuspid stenosis. Aortic Valve: The aortic valve is tricuspid. Aortic valve regurgitation is trivial. Mild to moderate aortic valve sclerosis/calcification is present, without any evidence of aortic stenosis. Pulmonic Valve: The pulmonic valve was not well visualized. Pulmonic valve regurgitation is trivial. No evidence of pulmonic stenosis. Aorta: The aortic root is normal in size and structure. Venous: The inferior vena cava is normal in size with greater than 50% respiratory variability, suggesting right atrial pressure of 3 mmHg.  IAS/Shunts: No atrial level shunt detected by color flow Doppler.  LEFT VENTRICLE PLAX 2D LVIDd:         4.60 cm  Diastology LVIDs:         2.90 cm  LV e' medial:   7.07 cm/s LV PW:         1.10 cm  LV E/e' medial: 10.8 LV IVS:        1.10 cm LVOT diam:     2.20 cm LV SV:         63 LV SV Index:   32 LVOT Area:     3.80 cm  RIGHT VENTRICLE RV S prime:     14.50 cm/s TAPSE (M-mode): 2.1 cm LEFT ATRIUM           Index       RIGHT ATRIUM           Index LA diam:      3.10 cm 1.55 cm/m  RA Area:     13.70 cm LA Vol (A4C): 33.7 ml 16.84 ml/m RA Volume:   26.40 ml  13.20 ml/m  AORTIC VALVE LVOT Vmax:   91.70 cm/s LVOT Vmean:  59.100 cm/s LVOT VTI:    0.167 m  AORTA Ao Root diam: 3.00 cm MITRAL VALVE MV Area (PHT): 2.87 cm    SHUNTS MV Decel Time: 264 msec    Systemic VTI:  0.17 m MV E velocity: 76.60 cm/s  Systemic Diam: 2.20 cm MV A velocity: 72.70 cm/s MV E/A ratio:  1.05 Audrey Rouge MD Electronically signed by Audrey Rouge MD Signature Date/Time: 05/21/2020/4:01:06 PM    Final      Scheduled Meds: . apixaban  5 mg Oral BID  . baricitinib  4 mg Oral Daily  . Chlorhexidine Gluconate Cloth  6 each Topical Daily  . diclofenac Sodium  2 g Topical QID  . feeding supplement (ENSURE ENLIVE)  237 mL Oral BID BM  . fluconazole  150 mg Oral Daily  . insulin aspart  0-9 Units Subcutaneous TID WC  . insulin detemir  5 Units Subcutaneous BID  . Ipratropium-Albuterol  1 puff Inhalation QID  . mouth rinse  15 mL Mouth Rinse BID  . methylPREDNISolone (SOLU-MEDROL) injection  45 mg Intravenous Q12H  . metoprolol succinate  150 mg Oral Daily  . multivitamin with minerals  1 tablet Oral Daily  . pantoprazole  40 mg Oral BID  . sacubitril-valsartan  1 tablet Oral BID  . sodium chloride flush  10-40 mL Intracatheter Q12H  . sulfamethoxazole-trimethoprim  1 tablet Oral Daily  . valGANciclovir  900 mg Oral Daily   Continuous Infusions: . sodium chloride    . azithromycin Stopped (05/22/20 1418)  . ceFEPime  (MAXIPIME) IV Stopped (05/23/20 7902)  . remdesivir 100 mg in NS 100 mL Stopped (05/23/20 1044)     LOS: 3 days     Bonnielee Haff, MD [facility]   To contact the attending provider between 7A-7P or the covering provider during after hours 7P-7A, please log into the web site [email or web address] and access using universal [place] password for that web site. If you do not have the password, please call the hospital operator.  05/23/2020, 12:33 PM

## 2020-05-24 LAB — CBC WITH DIFFERENTIAL/PLATELET
Abs Immature Granulocytes: 0.22 10*3/uL — ABNORMAL HIGH (ref 0.00–0.07)
Basophils Absolute: 0 10*3/uL (ref 0.0–0.1)
Basophils Relative: 1 %
Eosinophils Absolute: 0 10*3/uL (ref 0.0–0.5)
Eosinophils Relative: 0 %
HCT: 33.9 % — ABNORMAL LOW (ref 36.0–46.0)
Hemoglobin: 10.8 g/dL — ABNORMAL LOW (ref 12.0–15.0)
Immature Granulocytes: 3 %
Lymphocytes Relative: 47 %
Lymphs Abs: 3.5 10*3/uL (ref 0.7–4.0)
MCH: 28.1 pg (ref 26.0–34.0)
MCHC: 31.9 g/dL (ref 30.0–36.0)
MCV: 88.1 fL (ref 80.0–100.0)
Monocytes Absolute: 2.5 10*3/uL — ABNORMAL HIGH (ref 0.1–1.0)
Monocytes Relative: 34 %
Neutro Abs: 1.1 10*3/uL — ABNORMAL LOW (ref 1.7–7.7)
Neutrophils Relative %: 15 %
Platelets: 153 10*3/uL (ref 150–400)
RBC: 3.85 MIL/uL — ABNORMAL LOW (ref 3.87–5.11)
RDW: 14.8 % (ref 11.5–15.5)
WBC: 7.4 10*3/uL (ref 4.0–10.5)
nRBC: 0 % (ref 0.0–0.2)

## 2020-05-24 LAB — GLUCOSE, CAPILLARY
Glucose-Capillary: 112 mg/dL — ABNORMAL HIGH (ref 70–99)
Glucose-Capillary: 139 mg/dL — ABNORMAL HIGH (ref 70–99)
Glucose-Capillary: 213 mg/dL — ABNORMAL HIGH (ref 70–99)
Glucose-Capillary: 227 mg/dL — ABNORMAL HIGH (ref 70–99)

## 2020-05-24 LAB — COMPREHENSIVE METABOLIC PANEL
ALT: 29 U/L (ref 0–44)
AST: 22 U/L (ref 15–41)
Albumin: 2.4 g/dL — ABNORMAL LOW (ref 3.5–5.0)
Alkaline Phosphatase: 61 U/L (ref 38–126)
Anion gap: 10 (ref 5–15)
BUN: 31 mg/dL — ABNORMAL HIGH (ref 6–20)
CO2: 21 mmol/L — ABNORMAL LOW (ref 22–32)
Calcium: 8.4 mg/dL — ABNORMAL LOW (ref 8.9–10.3)
Chloride: 111 mmol/L (ref 98–111)
Creatinine, Ser: 0.62 mg/dL (ref 0.44–1.00)
GFR calc Af Amer: >60 mL/min (ref >60)
GFR calc non Af Amer: >60 mL/min (ref >60)
Glucose, Bld: 154 mg/dL — ABNORMAL HIGH (ref 70–99)
Potassium: 4.4 mmol/L (ref 3.5–5.1)
Sodium: 142 mmol/L (ref 135–145)
Total Bilirubin: 0.7 mg/dL (ref 0.3–1.2)
Total Protein: 5.6 g/dL — ABNORMAL LOW (ref 6.5–8.1)

## 2020-05-24 LAB — D-DIMER, QUANTITATIVE: D-Dimer, Quant: 17.13 ug/mL-FEU — ABNORMAL HIGH (ref 0.00–0.50)

## 2020-05-24 LAB — C-REACTIVE PROTEIN: CRP: 4.5 mg/dL — ABNORMAL HIGH (ref <1.0)

## 2020-05-24 LAB — PROCALCITONIN: Procalcitonin: 0.19 ng/mL

## 2020-05-24 MED ORDER — FUROSEMIDE 10 MG/ML IJ SOLN
40.0000 mg | Freq: Once | INTRAMUSCULAR | Status: AC
  Administered 2020-05-24: 40 mg via INTRAVENOUS
  Filled 2020-05-24: qty 4

## 2020-05-24 NOTE — Progress Notes (Signed)
Pharmacy Antibiotic Note  Audrey Gross is a 59 y.o. female admitted on 05/16/2020 with pneumonia.  PMH of CLL and recent hospitalization (8/2-8/7) for COVID-19, CXR with progression of pneumonia on 8/15.  Pharmacy has been consulted for Cefepime dosing.  Plan: Azithromycin per MD Continue Cefepime 2g IV q8h Follow up renal function, culture results, and clinical course.   Height: 5\' 7"  (170.2 cm) Weight: 78.6 kg (173 lb 4.5 oz) IBW/kg (Calculated) : 61.6  Temp (24hrs), Avg:97.4 F (36.3 C), Min:97 F (36.1 C), Max:98.9 F (37.2 C)  Recent Labs  Lab 05/25/2020 1014 05/16/2020 1523 05/21/20 0545 05/22/20 0525 05/23/20 0349 05/24/20 0300  WBC 10.8*  --  9.6 12.4* 9.8 7.4  CREATININE 0.75  --  0.62 0.64 0.63 0.62  LATICACIDVEN 2.0* 1.0  --   --   --   --     Estimated Creatinine Clearance: 81.8 mL/min (by C-G formula based on SCr of 0.62 mg/dL).    No Known Allergies  Antimicrobials this admission:  8/2 remdesivir>>8/6 8/15 remdesivir >> 8/19  8/15 cefepime x1, 8/16 cefepime >> (8/21) 8/15 vanc>>8/16 8/15 CTX>>8/16 8/15 azithro>> (8/19)  Dose adjustments this admission:   Microbiology results:  8/2 COVID+ 8/15 BCx x2: ngtd 8/16 MRSA PCR: neg  Thank you for allowing pharmacy to be a part of this patient's care.   Gretta Arab PharmD, BCPS Clinical Pharmacist [facility] main pharmacy [phone] 05/24/2020 7:22 AM

## 2020-05-24 NOTE — Progress Notes (Signed)
During Epic downtime, patient was very confused and pulling off oxygen, central line dressing, gown, sheets, and telemetry wires. This happened several times and patient only oriented to herself and the city & state. Patient stated she thought she was at the dentist. Orders obtained for non-violent bilateral wrist restraints as patient unable to follow commands to not continue pulling off oxygen tubing. Her oxygen saturations on room air was near 60%. Prior to onset of confusion, patient was given Ativan for anxiety. Will likely need to avoid this drug in future as it seemed to elicit a paradoxical effect.

## 2020-05-24 NOTE — Progress Notes (Signed)
PROGRESS NOTE    [name]  VQQ:[id number] DOB: 01/30/1961 DOA: 05/09/2020 PCP: Janith Lima, MD   Chief Complaint  Patient presents with   COVID Positive    Brief Narrative:  Audrey Gross is a 59 y.o. 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 with her last treatment about 2 weeks prior to admission, paroxysmal atrial fibrillation on Eliquis, obesity history of nonischemic cardiomyopathy with an EF of 35% back in 2019 repeated 2D echo that showed an improvement in ejection fraction to 50%, MR, she has not been vaccinated recently discharged from the hospital on 05/12/2020 and treated for COVID-19 infection she went home off oxygen comes in again for shortness of breath that started about 5 days prior to admission.  She was readmitted with acute hypoxic respiratory failure after recent discharge after treatment for COVID infection.   Assessment & Plan:   Acute respiratory failure with hypoxia secondary to pneumonia due to COVID-19: Unvaccinated Tested positive on 8/2 S/p steroids/remdesivir course during last hospital admission. Patient continues to require heated high flow along with nonrebreather. She had to be increased to 50 L/min yesterday. She also got confused and agitated overnight which was thought to be secondary to Ativan. We will discontinue this medication. She was started back on another course of Remdesivir. Today will be the last dose. She is also on steroids. She was also started on baricitinib and antibacterial agents. Vancomycin was discontinued. Remains on cefepime and azithromycin. Plan for 5-day course. Procalcitonin improved to 0.19. Due to elevated BNP patient was also given diuretics. Keep her in negative fluid balance. Give another dose of furosemide today. Continue with incentive spirometry and mobilization as much as possible. Patient also noted to have elevated D-dimer. Has gone up significantly to 17.13. Recent lower extremity Doppler study was  negative for DVT. She is too tenuous to go for CT scan at this time. However patient was already on anticoagulation with Eliquis. Was changed over to Lovenox yesterday. Continue for now. CRP is noted to be better today compared to yesterday. Prognosis is guarded at this time.  COVID-19 Labs  Recent Labs    05/22/20 0525 05/22/20 0526 05/23/20 0349 05/24/20 0300  DDIMER 3.31*  --  6.56* 17.13*  CRP  --  16.0* 10.4* 4.5*    Elevated Troponin:  Elevated likely secondary to Covid.  Low suspicion for ACS.  EKG from admission showed a baseline wander and was poor quality.  Troponins are now normal.  Echocardiogram shows normal EF with no wall motion abnormalities.  Do not anticipate any further cardiac work-up at this time.  Normal anion gap metabolic acidosis  Stable.  Continue to monitor.    Paroxysmal atrial fibrillation: Continue metoprolol.  Anticoagulated. Heart rate is reasonably well controlled.  Normocytic anemia Mild drop in hemoglobin is likely dilutional. No evidence of overt bleeding.  Poor PO intake Encourage oral intake.  Mobilize.    Odynophagia Looks like she was being treated empirically for 14 days for candidal esophagitis.  Will continue here.  CLL (chronic lymphocytic leukemia) Chemotherapy on hold. Continue bactrim and valganciclovir  Nonischemic cardiomyopathy (HCC)   Chronic systolic heart failure (HCC) Continue metoprolol, entresto.  Echocardiogram shows normal systolic function.  Transaminitis: Likely due to COVID-19.  Now normal.    Hypokalemia Replete.  Hyperglycemia Likely due to steroids.  HbA1c 5.7.  DVT prophylaxis: eliquis Code Status: full  Family Communication: Discussed with her husband yesterday. We will do so again today. Disposition: Hopefully return home  when improved.  Status is: Inpatient  Remains inpatient appropriate because:IV treatments appropriate due to intensity of illness or inability to take PO and Inpatient level  of care appropriate due to severity of illness   Dispo:  Patient From: Home  Planned Disposition: To be determined  Expected discharge date: 05/28/20  Medically stable for discharge: No      Consultants:   PCCM  Procedures:   none  Antimicrobials:  Anti-infectives (From admission, onward)   Start     Dose/Rate Route Frequency Ordered Stop   05/21/20 1300  sulfamethoxazole-trimethoprim (BACTRIM) 400-80 MG per tablet 1 tablet        1 tablet Oral Daily 05/21/20 1147     05/21/20 1000  remdesivir 100 mg in sodium chloride 0.9 % 100 mL IVPB       "Followed by" Linked Group Details   100 mg 200 mL/hr over 30 Minutes Intravenous Daily 05/08/2020 1333 05/24/20 1039   05/21/20 0900  ceFEPIme (MAXIPIME) 2 g in sodium chloride 0.9 % 100 mL IVPB        2 g 200 mL/hr over 30 Minutes Intravenous Every 8 hours 05/21/20 0832 05/26/20 1559   05/29/2020 2200  valGANciclovir (VALCYTE) 450 MG tablet TABS 900 mg        900 mg Oral Daily 06/01/2020 1421     06/02/2020 2100  vancomycin (VANCOCIN) IVPB 1000 mg/200 mL premix  Status:  Discontinued        1,000 mg 200 mL/hr over 60 Minutes Intravenous Every 8 hours 05/19/2020 1239 05/21/20 1055   05/19/2020 1430  fluconazole (DIFLUCAN) tablet 150 mg        150 mg Oral Daily 06/05/2020 1421 05/26/20 2359   05/22/2020 1430  cefTRIAXone (ROCEPHIN) 2 g in sodium chloride 0.9 % 100 mL IVPB  Status:  Discontinued        2 g 200 mL/hr over 30 Minutes Intravenous Every 24 hours 05/21/2020 1421 05/21/20 0815   05/15/2020 1400  azithromycin (ZITHROMAX) 500 mg in sodium chloride 0.9 % 250 mL IVPB        500 mg 250 mL/hr over 60 Minutes Intravenous Every 24 hours 05/26/2020 1337 05/25/20 1359   05/09/2020 1333  remdesivir 200 mg in sodium chloride 0.9% 250 mL IVPB       "Followed by" Linked Group Details   200 mg 580 mL/hr over 30 Minutes Intravenous Once 05/18/2020 1333 05/11/2020 1614   05/27/2020 1245  vancomycin (VANCOREADY) IVPB 1500 mg/300 mL        1,500 mg 150 mL/hr over  120 Minutes Intravenous  Once 05/27/2020 1239 05/27/2020 1812   05/31/2020 1215  ceFEPIme (MAXIPIME) 2 g in sodium chloride 0.9 % 100 mL IVPB        2 g 200 mL/hr over 30 Minutes Intravenous  Once 06/04/2020 1213 06/05/2020 1350     Subjective: Overnight events noted. More calmer this morning. Answering questions appropriately. Continues to have shortness of breath. Is noted to be distracted at times.  Objective: Vitals:   05/24/20 1000 05/24/20 1100 05/24/20 1135 05/24/20 1200  BP: 118/73 113/68  123/69  Pulse: 63 73  68  Resp: (!) 31 (!) 33  (!) 32  Temp:   (!) 97.2 F (36.2 C)   TempSrc:   Axillary   SpO2: 97% 96%  98%  Weight:      Height:        Intake/Output Summary (Last 24 hours) at 05/24/2020 1228 Last data filed at 05/24/2020 1042 Gross  per 24 hour  Intake 1124.04 ml  Output 1939 ml  Net -814.96 ml   Filed Weights   05/23/20 0009 05/23/20 0439 05/24/20 0352  Weight: 76.6 kg 76.6 kg 78.6 kg    Examination:  General appearance: Awake alert.  In no distress. Distracted Resp: Mildly tachypneic. No use of accessory muscles today. Coarse breath sounds with crackles in the bases. No wheezing or rhonchi.  Cardio: S1-S2 is normal regular.  No S3-S4.  No rubs murmurs or bruit GI: Abdomen is soft.  Nontender nondistended.  Bowel sounds are present normal.  No masses organomegaly Extremities: No edema. Noted to be moving all her extremities Neurologic: Alert. Distracted. Oriented to year month place. No obvious focal deficits noted.     Data Reviewed: I have personally reviewed following labs and imaging studies  CBC: Recent Labs  Lab 05/27/2020 1014 05/21/20 0545 05/22/20 0525 05/23/20 0349 05/24/20 0300  WBC 10.8* 9.6 12.4* 9.8 7.4  NEUTROABS 2.1 2.5 2.9 1.6* 1.1*  HGB 11.5* 11.3* 11.6* 11.2* 10.8*  HCT 35.2* 35.2* 36.0 34.9* 33.9*  MCV 86.9 87.8 88.5 87.9 88.1  PLT 237 213 240 189 761    Basic Metabolic Panel: Recent Labs  Lab 05/22/2020 1014 05/21/20 0545  05/22/20 0525 05/22/20 1915 05/23/20 0349 05/24/20 0300  NA 139 144 142  --  142 142  K 3.8 3.4* 4.2  --  3.6 4.4  CL 106 108 113*  --  108 111  CO2 22 21* 18*  --  20* 21*  GLUCOSE 129* 155* 283*  --  232* 154*  BUN 15 17 23*  --  28* 31*  CREATININE 0.75 0.62 0.64  --  0.63 0.62  CALCIUM 8.2* 8.6* 8.5*  --  8.5* 8.4*  MG  --   --   --  2.2  --   --     GFR: Estimated Creatinine Clearance: 81.8 mL/min (by C-G formula based on SCr of 0.62 mg/dL).  Liver Function Tests: Recent Labs  Lab 05/12/2020 1014 05/21/20 0545 05/22/20 0525 05/23/20 0349 05/24/20 0300  AST 54* 35 25 20 22   ALT 59* 48* 39 29 29  ALKPHOS 46 43 49 51 61  BILITOT 0.9 1.2 0.8 0.7 0.7  PROT 6.3* 6.2* 6.1* 5.9* 5.6*  ALBUMIN 2.6* 2.7* 2.5* 2.4* 2.4*    CBG: Recent Labs  Lab 05/23/20 1217 05/23/20 1618 05/23/20 2143 05/24/20 0849 05/24/20 1113  GLUCAP 160* 238* 215* 112* 139*     Recent Results (from the past 240 hour(s))  Culture, blood (Routine x 2)     Status: None (Preliminary result)   Collection Time: 05/09/2020 10:14 AM   Specimen: BLOOD  Result Value Ref Range Status   Specimen Description   Final    BLOOD PORTA CATH Performed at [facility], [place] [address], [place], [place] [address]    Special Requests   Final    BOTTLES DRAWN AEROBIC AND ANAEROBIC Blood Culture adequate volume Performed at [place] [address] [place], [place] [address]    Culture   Final    NO GROWTH 4 DAYS Performed at [facility], [place] [address] [place], [place] [address]    Report Status PENDING  Incomplete  MRSA PCR Screening     Status: None   Collection Time: 05/21/20  4:32 AM   Specimen: Nasal Mucosa; Nasopharyngeal  Result Value Ref Range Status   MRSA by PCR NEGATIVE NEGATIVE Final    Comment:  The GeneXpert MRSA Assay (FDA approved for NASAL specimens only), is one component of a comprehensive MRSA colonization surveillance program.  It is not intended to diagnose MRSA infection nor to guide or monitor treatment for MRSA infections. Performed at [facility], [place] [address] [place], [place] [address]          Radiology Studies: DG CHEST PORT 1 VIEW  Result Date: 05/22/2020 CLINICAL DATA:  COVID positive with increasing shortness of breath. EXAM: PORTABLE CHEST 1 VIEW COMPARISON:  Radiograph 05/27/2020.  CT 04/16/2020 FINDINGS: Right chest port remains in place. Patchy bilateral heterogeneous airspace opacities with mild progression over the past 2 days. No pneumothorax or pleural effusion. Stable heart size and mediastinal contours. Stable osseous structures. IMPRESSION: Patchy bilateral heterogeneous airspace opacities with mild progression over the past 2 days, pattern consistent with COVID pneumonia. Electronically Signed   By: Keith Rake M.D.   On: 05/22/2020 20:10     Scheduled Meds:  baricitinib  4 mg Oral Daily   Chlorhexidine Gluconate Cloth  6 each Topical Daily   diclofenac Sodium  2 g Topical QID   enoxaparin (LOVENOX) injection  75 mg Subcutaneous Q12H   feeding supplement (ENSURE ENLIVE)  237 mL Oral BID BM   fluconazole  150 mg Oral Daily   insulin aspart  0-9 Units Subcutaneous TID WC   insulin detemir  5 Units Subcutaneous BID   Ipratropium-Albuterol  1 puff Inhalation QID   mouth rinse  15 mL Mouth Rinse BID   methylPREDNISolone (SOLU-MEDROL) injection  45 mg Intravenous Q12H   metoprolol succinate  150 mg Oral Daily   multivitamin with minerals  1 tablet Oral Daily   pantoprazole  40 mg Oral BID   sacubitril-valsartan  1 tablet Oral BID   sodium chloride flush  10-40 mL Intracatheter Q12H   sulfamethoxazole-trimethoprim  1 tablet Oral Daily   valGANciclovir  900 mg Oral Daily   Continuous Infusions:  sodium chloride     azithromycin Stopped (05/23/20 1412)   ceFEPime (MAXIPIME) IV Stopped (05/24/20 0921)     LOS: 4 days     Bonnielee Haff, MD [facility]   To contact the attending provider between 7A-7P or the covering provider during after hours 7P-7A, please log into the web site [email or web address] and access using universal [place] password for that web site. If you do not have the password, please call the hospital operator.  05/24/2020, 12:28 PM

## 2020-05-25 LAB — CBC WITH DIFFERENTIAL/PLATELET
Abs Immature Granulocytes: 0.14 10*3/uL — ABNORMAL HIGH (ref 0.00–0.07)
Basophils Absolute: 0.1 10*3/uL (ref 0.0–0.1)
Basophils Relative: 1 %
Eosinophils Absolute: 0 10*3/uL (ref 0.0–0.5)
Eosinophils Relative: 0 %
HCT: 33.6 % — ABNORMAL LOW (ref 36.0–46.0)
Hemoglobin: 11 g/dL — ABNORMAL LOW (ref 12.0–15.0)
Immature Granulocytes: 2 %
Lymphocytes Relative: 58 %
Lymphs Abs: 4.8 10*3/uL — ABNORMAL HIGH (ref 0.7–4.0)
MCH: 28.7 pg (ref 26.0–34.0)
MCHC: 32.7 g/dL (ref 30.0–36.0)
MCV: 87.7 fL (ref 80.0–100.0)
Monocytes Absolute: 2.2 10*3/uL — ABNORMAL HIGH (ref 0.1–1.0)
Monocytes Relative: 27 %
Neutro Abs: 1 10*3/uL — ABNORMAL LOW (ref 1.7–7.7)
Neutrophils Relative %: 12 %
Platelets: 144 10*3/uL — ABNORMAL LOW (ref 150–400)
RBC: 3.83 MIL/uL — ABNORMAL LOW (ref 3.87–5.11)
RDW: 14.6 % (ref 11.5–15.5)
WBC: 8.2 10*3/uL (ref 4.0–10.5)
nRBC: 0 % (ref 0.0–0.2)

## 2020-05-25 LAB — COMPREHENSIVE METABOLIC PANEL
ALT: 30 U/L (ref 0–44)
AST: 22 U/L (ref 15–41)
Albumin: 2.5 g/dL — ABNORMAL LOW (ref 3.5–5.0)
Alkaline Phosphatase: 80 U/L (ref 38–126)
Anion gap: 11 (ref 5–15)
BUN: 37 mg/dL — ABNORMAL HIGH (ref 6–20)
CO2: 23 mmol/L (ref 22–32)
Calcium: 8.7 mg/dL — ABNORMAL LOW (ref 8.9–10.3)
Chloride: 111 mmol/L (ref 98–111)
Creatinine, Ser: 0.64 mg/dL (ref 0.44–1.00)
GFR calc Af Amer: >60 mL/min (ref >60)
GFR calc non Af Amer: >60 mL/min (ref >60)
Glucose, Bld: 170 mg/dL — ABNORMAL HIGH (ref 70–99)
Potassium: 4.3 mmol/L (ref 3.5–5.1)
Sodium: 145 mmol/L (ref 135–145)
Total Bilirubin: 0.9 mg/dL (ref 0.3–1.2)
Total Protein: 5.5 g/dL — ABNORMAL LOW (ref 6.5–8.1)

## 2020-05-25 LAB — CULTURE, BLOOD (ROUTINE X 2)
Culture: NO GROWTH
Special Requests: ADEQUATE

## 2020-05-25 LAB — GLUCOSE, CAPILLARY
Glucose-Capillary: 173 mg/dL — ABNORMAL HIGH (ref 70–99)
Glucose-Capillary: 200 mg/dL — ABNORMAL HIGH (ref 70–99)
Glucose-Capillary: 149 mg/dL — ABNORMAL HIGH (ref 70–99)
Glucose-Capillary: 169 mg/dL — ABNORMAL HIGH (ref 70–99)

## 2020-05-25 LAB — D-DIMER, QUANTITATIVE: D-Dimer, Quant: 8.73 ug/mL-FEU — ABNORMAL HIGH (ref 0.00–0.50)

## 2020-05-25 LAB — C-REACTIVE PROTEIN: CRP: 1.6 mg/dL — ABNORMAL HIGH (ref <1.0)

## 2020-05-25 MED ORDER — FUROSEMIDE 10 MG/ML IJ SOLN
40.0000 mg | Freq: Once | INTRAMUSCULAR | Status: AC
  Administered 2020-05-25: 40 mg via INTRAVENOUS
  Filled 2020-05-25: qty 4

## 2020-05-25 NOTE — TOC Progression Note (Signed)
Transition of Care [facility]) - Progression Note    Patient Details  Name: Audrey Gross MRN: [id number] Date of Birth: 1961/06/09  Transition of [facility]) CM/SW Contact  Leeroy Cha, RN Phone Number: 05/25/2020, 9:23 AM  Clinical Narrative:    hfnrbm at 40l/min in patient with covid and unvaccinated, hx of cll and recent chemo, treated for covid in July and returns with dyspnea and fatique. CRP and D.Dimer remain elevated, port a cath bld culture x3d neg.iv solu medrol and iv maxipime. Following for progression and toc needs plan is to return to home at this time,   Expected Discharge Plan: Home/Self Care Barriers to Discharge: Continued Medical Work up  Expected Discharge Plan and Services Expected Discharge Plan: Home/Self Care   Discharge Planning Services: CM Consult   Living arrangements for the past 2 months: Single Family Home                                       Social Determinants of Health (SDOH) Interventions    Readmission Risk Interventions No flowsheet data found.

## 2020-05-25 NOTE — Progress Notes (Signed)
This RN encouraged patient to get up to recliner chair for a while and provided education on the importance of changing position for the lungs with covid. Patient hesitantly agreed. Patient asked to use bedside commode first and this RN with another RN assisted up, with minimal assist. The patient became very restless and tachypneac, desating to 77% but recovering quickly above 90%. Patient sat on bedside commode for 40 minutes with RN beside. Patient then refused to get to chair and returned to bed with assist of two. Will continue to encourage mobility.

## 2020-05-25 NOTE — Plan of Care (Signed)
Patient refuses to mobilize with this RN or transfer to recliner. Will continue to encourage.

## 2020-05-25 NOTE — Care Plan (Deleted)
Patient refuses to mobilize with this RN or transfer to recliner. Will continue to encourage.

## 2020-05-25 NOTE — Progress Notes (Signed)
PROGRESS NOTE    Audrey Gross  ZOX:[id number] DOB: June 25, 1961 DOA: 06/01/2020 PCP: Janith Lima, MD   Chief Complaint  Patient presents with  . COVID Positive    Brief Narrative:  Audrey Gross is a 59 y.o. 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 with her last treatment about 2 weeks prior to admission, paroxysmal atrial fibrillation on Eliquis, obesity history of nonischemic cardiomyopathy with an EF of 35% back in 2019 repeated 2D echo that showed an improvement in ejection fraction to 50%, MR, Recently discharged from the hospital on 05/12/2020 and treated for COVID-19 infection she went home off oxygen comes in again for shortness of breath that started about 5 days prior to admission.  She was readmitted with acute hypoxic respiratory failure after recent discharge after treatment for COVID infection.   Assessment & Plan:   Acute respiratory failure with hypoxia secondary to pneumonia due to COVID-19: Apparently vaccinated against COVID-19 in July. Tested positive on 8/2 S/p steroids/remdesivir course during last hospital admission. She had to be rehospitalized due to acute respiratory failure with hypoxia.  She was started back on another course of Remdesivir.  She has completed 5 days.  She remains on steroids.  She is also on baricitinib and antibacterials. She continues to require high amounts of oxygen.  Currently on heated high flow at 40 L/min.  Per nursing staff she is not requiring nonrebreather as much as the last few days. Remains poorly motivated to do any physical activity.  Her appetite is also been very poor.  Patient was encouraged today to eat her meals. Procalcitonin had improved to 0.19.  Plan is for 5 days of antibacterials. Patient also getting diuretics to keep her head negative fluid balance. Continue with incentive spirometry and mobilization as much as possible. Patient also noted to have elevated D-dimer.  D-dimer peaked at 17.13.  Noted to be  8.73 today.  Recent lower extremity Doppler study was negative for DVT. She is too tenuous to go for CT scan at this time. However patient was already on anticoagulation with Eliquis.  Patient was changed over to therapeutic Lovenox.  Continue for now.   CRP has improved to 1.6. Prognosis remains guarded.  COVID-19 Labs  Recent Labs    05/23/20 0349 05/24/20 0300 05/25/20 0506  DDIMER 6.56* 17.13* 8.73*  CRP 10.4* 4.5* 1.6*    Elevated Troponin:  Elevated likely secondary to Covid.  Low suspicion for ACS.  EKG from admission showed a baseline wander and was poor quality.  Troponins are now normal.  Echocardiogram shows normal EF with no wall motion abnormalities.  Do not anticipate any further cardiac work-up at this time.  Normal anion gap metabolic acidosis  Appears to have resolved.  Continue to monitor.  Paroxysmal atrial fibrillation: Continue metoprolol.  Anticoagulated. Heart rate is reasonably well controlled.  Normocytic anemia Mild drop in hemoglobin is likely dilutional. No evidence of overt bleeding.  Poor PO intake Encourage oral intake.  Mobilize.    Odynophagia Looks like she was being treated empirically with fluconazole for candidal esophagitis.  Being continued.  CLL (chronic lymphocytic leukemia) Chemotherapy on hold. Continue bactrim and valganciclovir  Nonischemic cardiomyopathy (HCC)  Chronic systolic heart failure (HCC) Continue metoprolol, entresto.  Echocardiogram shows normal systolic function.  Transaminitis: Likely due to COVID-19.  Now normal.    Hypokalemia Replete.  Hyperglycemia Likely due to steroids.  HbA1c 5.7.  DVT prophylaxis: Full dose Lovenox Code Status: Full code Family Communication: Husband being  updated daily Disposition: Hopefully return home when improved.  Status is: Inpatient  Remains inpatient appropriate because:IV treatments appropriate due to intensity of illness or inability to take PO and Inpatient level  of care appropriate due to severity of illness   Dispo:  Patient From: Home  Planned Disposition: To be determined  Expected discharge date: 05/28/20  Medically stable for discharge: No      Consultants:   PCCM  Procedures:   none  Antimicrobials:  Anti-infectives (From admission, onward)   Start     Dose/Rate Route Frequency Ordered Stop   05/21/20 1300  sulfamethoxazole-trimethoprim (BACTRIM) 400-80 MG per tablet 1 tablet        1 tablet Oral Daily 05/21/20 1147     05/21/20 1000  remdesivir 100 mg in sodium chloride 0.9 % 100 mL IVPB       "Followed by" Linked Group Details   100 mg 200 mL/hr over 30 Minutes Intravenous Daily 05/10/2020 1333 05/24/20 1039   05/21/20 0900  ceFEPIme (MAXIPIME) 2 g in sodium chloride 0.9 % 100 mL IVPB        2 g 200 mL/hr over 30 Minutes Intravenous Every 8 hours 05/21/20 0832 05/26/20 1559   05/14/2020 2200  valGANciclovir (VALCYTE) 450 MG tablet TABS 900 mg        900 mg Oral Daily 05/14/2020 1421     05/28/2020 2100  vancomycin (VANCOCIN) IVPB 1000 mg/200 mL premix  Status:  Discontinued        1,000 mg 200 mL/hr over 60 Minutes Intravenous Every 8 hours 06/02/2020 1239 05/21/20 1055   06/04/2020 1430  fluconazole (DIFLUCAN) tablet 150 mg        150 mg Oral Daily 05/26/2020 1421 05/26/20 2359   05/17/2020 1430  cefTRIAXone (ROCEPHIN) 2 g in sodium chloride 0.9 % 100 mL IVPB  Status:  Discontinued        2 g 200 mL/hr over 30 Minutes Intravenous Every 24 hours 05/17/2020 1421 05/21/20 0815   05/31/2020 1400  azithromycin (ZITHROMAX) 500 mg in sodium chloride 0.9 % 250 mL IVPB        500 mg 250 mL/hr over 60 Minutes Intravenous Every 24 hours 05/17/2020 1337 05/24/20 1515   05/25/2020 1333  remdesivir 200 mg in sodium chloride 0.9% 250 mL IVPB       "Followed by" Linked Group Details   200 mg 580 mL/hr over 30 Minutes Intravenous Once 05/17/2020 1333 05/12/2020 1614   05/30/2020 1245  vancomycin (VANCOREADY) IVPB 1500 mg/300 mL        1,500 mg 150 mL/hr over  120 Minutes Intravenous  Once 05/15/2020 1239 05/16/2020 1812   05/24/2020 1215  ceFEPIme (MAXIPIME) 2 g in sodium chloride 0.9 % 100 mL IVPB        2 g 200 mL/hr over 30 Minutes Intravenous  Once 05/06/2020 1213 05/16/2020 1350     Subjective: Patient noted to be distracted again this morning but following commands.  Told her the importance of nutrition.  She was encouraged to eat her meals.  Continues to have some difficulty breathing.  Denies any chest pain.  Objective: Vitals:   05/25/20 0800 05/25/20 0827 05/25/20 0900 05/25/20 1030  BP:   104/61   Pulse:   74 66  Resp:   (!) 35 (!) 35  Temp:  98.2 F (36.8 C)    TempSrc:  Oral    SpO2: 95%  100% 95%  Weight:      Height:  Intake/Output Summary (Last 24 hours) at 05/25/2020 1152 Last data filed at 05/25/2020 1000 Gross per 24 hour  Intake 826.83 ml  Output 1550 ml  Net -723.17 ml   Filed Weights   05/23/20 0439 05/24/20 0352 05/25/20 0429  Weight: 76.6 kg 78.6 kg 77.5 kg    Examination:  General appearance: Awake alert.  In no distress.  Noted to be distracted Resp: Mildly tachypneic at rest.  No use of accessory muscles.  Coarse breath sounds with crackles at the bases.  No wheezing or rhonchi. Cardio: S1-S2 is normal regular.  No S3-S4.  No rubs murmurs or bruit GI: Abdomen is soft.  Nontender nondistended.  Bowel sounds are present normal.  No masses organomegaly Extremities: No edema.  Moving all her extremities Neurologic:   No focal neurological deficits.     Data Reviewed: I have personally reviewed following labs and imaging studies  CBC: Recent Labs  Lab 05/21/20 0545 05/22/20 0525 05/23/20 0349 05/24/20 0300 05/25/20 0506  WBC 9.6 12.4* 9.8 7.4 8.2  NEUTROABS 2.5 2.9 1.6* 1.1* 1.0*  HGB 11.3* 11.6* 11.2* 10.8* 11.0*  HCT 35.2* 36.0 34.9* 33.9* 33.6*  MCV 87.8 88.5 87.9 88.1 87.7  PLT 213 240 189 153 144*    Basic Metabolic Panel: Recent Labs  Lab 05/21/20 0545 05/22/20 0525 05/22/20 1915  05/23/20 0349 05/24/20 0300 05/25/20 0506  NA 144 142  --  142 142 145  K 3.4* 4.2  --  3.6 4.4 4.3  CL 108 113*  --  108 111 111  CO2 21* 18*  --  20* 21* 23  GLUCOSE 155* 283*  --  232* 154* 170*  BUN 17 23*  --  28* 31* 37*  CREATININE 0.62 0.64  --  0.63 0.62 0.64  CALCIUM 8.6* 8.5*  --  8.5* 8.4* 8.7*  MG  --   --  2.2  --   --   --     GFR: Estimated Creatinine Clearance: 81.3 mL/min (by C-G formula based on SCr of 0.64 mg/dL).  Liver Function Tests: Recent Labs  Lab 05/21/20 0545 05/22/20 0525 05/23/20 0349 05/24/20 0300 05/25/20 0506  AST 35 25 20 22 22   ALT 48* 39 29 29 30   ALKPHOS 43 49 51 61 80  BILITOT 1.2 0.8 0.7 0.7 0.9  PROT 6.2* 6.1* 5.9* 5.6* 5.5*  ALBUMIN 2.7* 2.5* 2.4* 2.4* 2.5*    CBG: Recent Labs  Lab 05/24/20 0849 05/24/20 1113 05/24/20 1628 05/24/20 2153 05/25/20 0806  GLUCAP 112* 139* 213* 227* 173*     Recent Results (from the past 240 hour(s))  Culture, blood (Routine x 2)     Status: None   Collection Time: 05/26/2020 10:14 AM   Specimen: BLOOD  Result Value Ref Range Status   Specimen Description   Final    BLOOD PORTA CATH Performed at [facility] [address], [place], [place] [address]    Special Requests   Final    BOTTLES DRAWN AEROBIC AND ANAEROBIC Blood Culture adequate volume Performed at [facility] [address] [place], [place] [address]    Culture   Final    NO GROWTH 5 DAYS Performed at [facility], [place] [address] [place], [place] [address]    Report Status 05/25/2020 FINAL  Final  MRSA PCR Screening     Status: None   Collection Time: 05/21/20  4:32 AM   Specimen: Nasal Mucosa; Nasopharyngeal  Result Value Ref Range  Status   MRSA by PCR NEGATIVE NEGATIVE Final    Comment:        The GeneXpert MRSA Assay (FDA approved for NASAL specimens only), is one component of a comprehensive MRSA colonization surveillance program. It is not intended to diagnose  MRSA infection nor to guide or monitor treatment for MRSA infections. Performed at [facility], [place] [address], [place], [place] [address]          Radiology Studies: No results found.   Scheduled Meds: . baricitinib  4 mg Oral Daily  . Chlorhexidine Gluconate Cloth  6 each Topical Daily  . diclofenac Sodium  2 g Topical QID  . enoxaparin (LOVENOX) injection  75 mg Subcutaneous Q12H  . feeding supplement (ENSURE ENLIVE)  237 mL Oral BID BM  . fluconazole  150 mg Oral Daily  . insulin aspart  0-9 Units Subcutaneous TID WC  . insulin detemir  5 Units Subcutaneous BID  . Ipratropium-Albuterol  1 puff Inhalation QID  . mouth rinse  15 mL Mouth Rinse BID  . methylPREDNISolone (SOLU-MEDROL) injection  45 mg Intravenous Q12H  . metoprolol succinate  150 mg Oral Daily  . multivitamin with minerals  1 tablet Oral Daily  . pantoprazole  40 mg Oral BID  . sacubitril-valsartan  1 tablet Oral BID  . sodium chloride flush  10-40 mL Intracatheter Q12H  . sulfamethoxazole-trimethoprim  1 tablet Oral Daily  . valGANciclovir  900 mg Oral Daily   Continuous Infusions: . sodium chloride    . ceFEPime (MAXIPIME) IV Stopped (05/25/20 0930)     LOS: 5 days     Bonnielee Haff, MD [facility]   To contact the attending provider between 7A-7P or the covering provider during after hours 7P-7A, please log into the web site [email or web address] and access using universal [place] password for that web site. If you do not have the password, please call the hospital operator.  05/25/2020, 11:52 AM

## 2020-05-26 LAB — COMPREHENSIVE METABOLIC PANEL
ALT: 23 U/L (ref 0–44)
AST: 18 U/L (ref 15–41)
Albumin: 3.2 g/dL — ABNORMAL LOW (ref 3.5–5.0)
Alkaline Phosphatase: 86 U/L (ref 38–126)
Anion gap: 13 (ref 5–15)
BUN: 51 mg/dL — ABNORMAL HIGH (ref 6–20)
CO2: 28 mmol/L (ref 22–32)
Calcium: 9.5 mg/dL (ref 8.9–10.3)
Chloride: 98 mmol/L (ref 98–111)
Creatinine, Ser: 1.05 mg/dL — ABNORMAL HIGH (ref 0.44–1.00)
GFR calc Af Amer: >60 mL/min (ref >60)
GFR calc non Af Amer: 58 mL/min — ABNORMAL LOW (ref >60)
Glucose, Bld: 87 mg/dL (ref 70–99)
Potassium: 4.5 mmol/L (ref 3.5–5.1)
Sodium: 139 mmol/L (ref 135–145)
Total Bilirubin: 0.4 mg/dL (ref 0.3–1.2)
Total Protein: 7.3 g/dL (ref 6.5–8.1)

## 2020-05-26 LAB — GLUCOSE, CAPILLARY
Glucose-Capillary: 173 mg/dL — ABNORMAL HIGH (ref 70–99)
Glucose-Capillary: 148 mg/dL — ABNORMAL HIGH (ref 70–99)
Glucose-Capillary: 155 mg/dL — ABNORMAL HIGH (ref 70–99)
Glucose-Capillary: 162 mg/dL — ABNORMAL HIGH (ref 70–99)

## 2020-05-26 LAB — C-REACTIVE PROTEIN: CRP: 2.8 mg/dL — ABNORMAL HIGH (ref <1.0)

## 2020-05-26 LAB — D-DIMER, QUANTITATIVE: D-Dimer, Quant: 3.28 ug/mL-FEU — ABNORMAL HIGH (ref 0.00–0.50)

## 2020-05-26 NOTE — Progress Notes (Signed)
PT Cancellation Note  Patient Details Name: Audrey Gross MRN: [id number] DOB: 1961-08-19   Cancelled Treatment:     PT order received but eval deferred on advice of RN - pt desat into 70s with min exertion.  Will follow.   [name] 05/26/2020, 4:00 PM

## 2020-05-26 NOTE — Progress Notes (Signed)
Pt refused bedpan, pt repeatedly states she is going to use BSC. Assist x2 to get pt to [facility], with difficult transition. Pt respiration rates increased, desat to 70s, FiO2 increased to 90% to recover with non-rebreather in plcae. Pt then placed back in bed. Will continue to monitor.

## 2020-05-26 NOTE — Progress Notes (Signed)
PROGRESS NOTE    Audrey Gross  ZYS:[id number] DOB: [birth year] DOA: 05/30/2020 PCP: Janith Lima, MD   Chief Complaint  Patient presents with  . COVID Positive    Brief Narrative:  Audrey Gross is a 59 y.o. 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 with her last treatment about 2 weeks prior to admission, paroxysmal atrial fibrillation on Eliquis, obesity history of nonischemic cardiomyopathy with an EF of 35% back in 2019 repeated 2D echo that showed an improvement in ejection fraction to 50%, MR, Recently discharged from the hospital on 05/12/2020 and treated for COVID-19 infection she went home off oxygen comes in again for shortness of breath that started about 5 days prior to admission.  She was readmitted with acute hypoxic respiratory failure after recent discharge after treatment for COVID infection.   Assessment & Plan:   Acute respiratory failure with hypoxia secondary to pneumonia due to COVID-19: Apparently vaccinated against COVID-19 in July. Tested positive on 8/2 S/p steroids/remdesivir course during last hospital admission. She had to be rehospitalized due to acute respiratory failure with hypoxia.  She was started back on another course of Remdesivir.  She has completed 5 days.  She remains on steroids.  She is also on baricitinib and antibacterials. Over the past 24 to 48 hours patient's oxygen requirements have improved.  She is down to 30 L of heated high flow.  Not requiring nonrebreather. Inflammatory markers have improved.  CRP is down to 2.8.  D-dimer down to 3.28. Patient encouraged to use incentive spirometer and mobilize as much as possible.  Patient also encouraged to improve her oral intake. For elevated D-dimer she did undergo lower extremity Doppler study which was negative for DVT.  She was on Eliquis for history of atrial fibrillation.  She was changed over to therapeutic Lovenox.  Continue for now. Due to elevated procalcitonin she was also on  antibacterial agents.  She has completed a 5-day course. She was also given furosemide.  Noted to have elevated BUN and creatinine today.  We will hold off on giving her more doses for now.  Recheck labs tomorrow.  She is on Entresto and Bactrim which may have to be held if creatinine continues to rise. Patient seems to be showing some signs of improvement.  This is encouraging.   COVID-19 Labs  Recent Labs    05/24/20 0300 05/25/20 0506 05/26/20 0500  DDIMER 17.13* 8.73* 3.28*  CRP 4.5* 1.6* 2.8*    Elevated Troponin:  Elevated likely secondary to Covid.  Low suspicion for ACS.  EKG from admission showed a baseline wander and was poor quality.  Troponins are now normal.  Echocardiogram shows normal EF with no wall motion abnormalities.  Do not anticipate any further cardiac work-up at this time.  Normal anion gap metabolic acidosis  Appears to have resolved.  Continue to monitor.  Paroxysmal atrial fibrillation: Continue metoprolol.  Anticoagulated. Heart rate is reasonably well controlled.  Normocytic anemia Mild drop in hemoglobin is likely dilutional. No evidence of overt bleeding.  Poor PO intake Encourage oral intake.  Mobilize.    Odynophagia Looks like she was being treated empirically with fluconazole for candidal esophagitis.    CLL (chronic lymphocytic leukemia) Chemotherapy on hold. Continue bactrim and valganciclovir  Nonischemic cardiomyopathy (HCC)  Chronic systolic heart failure (HCC) Continue metoprolol, entresto.  Echocardiogram shows normal systolic function.  Transaminitis: Likely due to COVID-19.  Now normal.    Hypokalemia Repleted.  Hyperglycemia Likely due to steroids.  HbA1c 5.7.  CBGs are reasonably well controlled.  DVT prophylaxis: Full dose Lovenox Code Status: Full code Family Communication: Husband being updated daily Disposition: Hopefully return home when improved.  PT and OT evaluation.  Status is: Inpatient  Remains  inpatient appropriate because:IV treatments appropriate due to intensity of illness or inability to take PO and Inpatient level of care appropriate due to severity of illness   Dispo:  Patient From: Home  Planned Disposition: To be determined  Expected discharge date: 05/28/20  Medically stable for discharge: No      Consultants:   PCCM  Procedures:   none  Antimicrobials:  Anti-infectives (From admission, onward)   Start     Dose/Rate Route Frequency Ordered Stop   05/21/20 1300  sulfamethoxazole-trimethoprim (BACTRIM) 400-80 MG per tablet 1 tablet        1 tablet Oral Daily 05/21/20 1147     05/21/20 1000  remdesivir 100 mg in sodium chloride 0.9 % 100 mL IVPB       "Followed by" Linked Group Details   100 mg 200 mL/hr over 30 Minutes Intravenous Daily 06/05/2020 1333 05/24/20 1039   05/21/20 0900  ceFEPIme (MAXIPIME) 2 g in sodium chloride 0.9 % 100 mL IVPB        2 g 200 mL/hr over 30 Minutes Intravenous Every 8 hours 05/21/20 0832 05/26/20 0922   05/19/2020 2200  valGANciclovir (VALCYTE) 450 MG tablet TABS 900 mg        900 mg Oral Daily 05/23/2020 1421     05/27/2020 2100  vancomycin (VANCOCIN) IVPB 1000 mg/200 mL premix  Status:  Discontinued        1,000 mg 200 mL/hr over 60 Minutes Intravenous Every 8 hours 05/15/2020 1239 05/21/20 1055   05/08/2020 1430  fluconazole (DIFLUCAN) tablet 150 mg        150 mg Oral Daily 06/03/2020 1421 05/26/20 2359   05/15/2020 1430  cefTRIAXone (ROCEPHIN) 2 g in sodium chloride 0.9 % 100 mL IVPB  Status:  Discontinued        2 g 200 mL/hr over 30 Minutes Intravenous Every 24 hours 05/31/2020 1421 05/21/20 0815   05/21/2020 1400  azithromycin (ZITHROMAX) 500 mg in sodium chloride 0.9 % 250 mL IVPB        500 mg 250 mL/hr over 60 Minutes Intravenous Every 24 hours 05/07/2020 1337 05/24/20 1515   06/04/2020 1333  remdesivir 200 mg in sodium chloride 0.9% 250 mL IVPB       "Followed by" Linked Group Details   200 mg 580 mL/hr over 30 Minutes Intravenous  Once 05/31/2020 1333 05/28/2020 1614   06/03/2020 1245  vancomycin (VANCOREADY) IVPB 1500 mg/300 mL        1,500 mg 150 mL/hr over 120 Minutes Intravenous  Once 05/06/2020 1239 05/12/2020 1812   05/09/2020 1215  ceFEPIme (MAXIPIME) 2 g in sodium chloride 0.9 % 100 mL IVPB        2 g 200 mL/hr over 30 Minutes Intravenous  Once 05/09/2020 1213 05/27/2020 1350     Subjective: Not very communicative.  Distracted as before.  Denies any chest pain.  Shortness of breath seems to be improving.  No nausea vomiting.  Objective: Vitals:   05/26/20 0729 05/26/20 0730 05/26/20 0800 05/26/20 0820  BP:   128/73   Pulse:   64   Resp:   (!) 32   Temp:    97.6 F (36.4 C)  TempSrc:    Oral  SpO2: (!) 86% Marland Kitchen)  84% 92%   Weight:      Height:        Intake/Output Summary (Last 24 hours) at 05/26/2020 1144 Last data filed at 05/26/2020 1100 Gross per 24 hour  Intake 377.43 ml  Output 1550 ml  Net -1172.57 ml   Filed Weights   05/24/20 0352 05/25/20 0429 05/26/20 0453  Weight: 78.6 kg 77.5 kg 77.1 kg    Examination:  General appearance: Awake alert.  In no distress.  Distracted Resp: Less tachypneic compared to the last few days.  Improved air entry bilaterally.  Crackles bilateral bases.  No wheezing or rhonchi.  Cardio: S1-S2 is normal regular.  No S3-S4.  No rubs murmurs or bruit GI: Abdomen is soft.  Nontender nondistended.  Bowel sounds are present normal.  No masses organomegaly Extremities: No edema.  Moving all her extremities. Neurologic:  No focal neurological deficits.      Data Reviewed: I have personally reviewed following labs and imaging studies  CBC: Recent Labs  Lab 05/21/20 0545 05/22/20 0525 05/23/20 0349 05/24/20 0300 05/25/20 0506  WBC 9.6 12.4* 9.8 7.4 8.2  NEUTROABS 2.5 2.9 1.6* 1.1* 1.0*  HGB 11.3* 11.6* 11.2* 10.8* 11.0*  HCT 35.2* 36.0 34.9* 33.9* 33.6*  MCV 87.8 88.5 87.9 88.1 87.7  PLT 213 240 189 153 144*    Basic Metabolic Panel: Recent Labs  Lab  05/22/20 0525 05/22/20 1915 05/23/20 0349 05/24/20 0300 05/25/20 0506 05/26/20 0500  NA 142  --  142 142 145 139  K 4.2  --  3.6 4.4 4.3 4.5  CL 113*  --  108 111 111 98  CO2 18*  --  20* 21* 23 28  GLUCOSE 283*  --  232* 154* 170* 87  BUN 23*  --  28* 31* 37* 51*  CREATININE 0.64  --  0.63 0.62 0.64 1.05*  CALCIUM 8.5*  --  8.5* 8.4* 8.7* 9.5  MG  --  2.2  --   --   --   --     GFR: Estimated Creatinine Clearance: 61.7 mL/min (A) (by C-G formula based on SCr of 1.05 mg/dL (H)).  Liver Function Tests: Recent Labs  Lab 05/22/20 0525 05/23/20 0349 05/24/20 0300 05/25/20 0506 05/26/20 0500  AST 25 20 22 22 18   ALT 39 29 29 30 23   ALKPHOS 49 51 61 80 86  BILITOT 0.8 0.7 0.7 0.9 0.4  PROT 6.1* 5.9* 5.6* 5.5* 7.3  ALBUMIN 2.5* 2.4* 2.4* 2.5* 3.2*    CBG: Recent Labs  Lab 05/25/20 0806 05/25/20 1231 05/25/20 1745 05/25/20 2143 05/26/20 0746  GLUCAP 173* 200* 149* 169* 173*     Recent Results (from the past 240 hour(s))  Culture, blood (Routine x 2)     Status: None   Collection Time: 05/22/2020 10:14 AM   Specimen: BLOOD  Result Value Ref Range Status   Specimen Description   Final    BLOOD PORTA CATH Performed at [facility] [address], [place], [place] [address]    Special Requests   Final    BOTTLES DRAWN AEROBIC AND ANAEROBIC Blood Culture adequate volume Performed at [facility] [address], [place], [place] [address]    Culture   Final    NO GROWTH 5 DAYS Performed at [facility], [place] [address], [place], [place] [address]    Report Status 05/25/2020 FINAL  Final  MRSA PCR Screening     Status: None   Collection  Time: 05/21/20  4:32 AM   Specimen: Nasal Mucosa; Nasopharyngeal  Result Value Ref Range Status   MRSA by PCR NEGATIVE NEGATIVE Final    Comment:        The GeneXpert MRSA Assay (FDA approved for NASAL specimens only), is one component of a comprehensive MRSA  colonization surveillance program. It is not intended to diagnose MRSA infection nor to guide or monitor treatment for MRSA infections. Performed at [facility], [place] [address], [place], [place] [address]          Radiology Studies: No results found.   Scheduled Meds: . baricitinib  4 mg Oral Daily  . Chlorhexidine Gluconate Cloth  6 each Topical Daily  . diclofenac Sodium  2 g Topical QID  . enoxaparin (LOVENOX) injection  75 mg Subcutaneous Q12H  . feeding supplement (ENSURE ENLIVE)  237 mL Oral BID BM  . fluconazole  150 mg Oral Daily  . insulin aspart  0-9 Units Subcutaneous TID WC  . insulin detemir  5 Units Subcutaneous BID  . Ipratropium-Albuterol  1 puff Inhalation QID  . mouth rinse  15 mL Mouth Rinse BID  . methylPREDNISolone (SOLU-MEDROL) injection  45 mg Intravenous Q12H  . metoprolol succinate  150 mg Oral Daily  . multivitamin with minerals  1 tablet Oral Daily  . pantoprazole  40 mg Oral BID  . sacubitril-valsartan  1 tablet Oral BID  . sodium chloride flush  10-40 mL Intracatheter Q12H  . sulfamethoxazole-trimethoprim  1 tablet Oral Daily  . valGANciclovir  900 mg Oral Daily   Continuous Infusions: . sodium chloride       LOS: 6 days     Bonnielee Haff, MD [facility]   To contact the attending provider between 7A-7P or the covering provider during after hours 7P-7A, please log into the web site [email or web address] and access using universal [place] password for that web site. If you do not have the password, please call the hospital operator.  05/26/2020, 11:44 AM

## 2020-05-27 LAB — GLUCOSE, CAPILLARY
Glucose-Capillary: 126 mg/dL — ABNORMAL HIGH (ref 70–99)
Glucose-Capillary: 156 mg/dL — ABNORMAL HIGH (ref 70–99)
Glucose-Capillary: 291 mg/dL — ABNORMAL HIGH (ref 70–99)
Glucose-Capillary: 152 mg/dL — ABNORMAL HIGH (ref 70–99)

## 2020-05-27 LAB — COMPREHENSIVE METABOLIC PANEL
ALT: 41 U/L (ref 0–44)
AST: 38 U/L (ref 15–41)
Albumin: 2.6 g/dL — ABNORMAL LOW (ref 3.5–5.0)
Alkaline Phosphatase: 98 U/L (ref 38–126)
Anion gap: 12 (ref 5–15)
BUN: 36 mg/dL — ABNORMAL HIGH (ref 6–20)
CO2: 20 mmol/L — ABNORMAL LOW (ref 22–32)
Calcium: 8.8 mg/dL — ABNORMAL LOW (ref 8.9–10.3)
Chloride: 112 mmol/L — ABNORMAL HIGH (ref 98–111)
Creatinine, Ser: 0.74 mg/dL (ref 0.44–1.00)
GFR calc Af Amer: >60 mL/min (ref >60)
GFR calc non Af Amer: >60 mL/min (ref >60)
Glucose, Bld: 272 mg/dL — ABNORMAL HIGH (ref 70–99)
Potassium: 4.3 mmol/L (ref 3.5–5.1)
Sodium: 144 mmol/L (ref 135–145)
Total Bilirubin: 0.8 mg/dL (ref 0.3–1.2)
Total Protein: 5.4 g/dL — ABNORMAL LOW (ref 6.5–8.1)

## 2020-05-27 LAB — D-DIMER, QUANTITATIVE: D-Dimer, Quant: 2.84 ug/mL-FEU — ABNORMAL HIGH (ref 0.00–0.50)

## 2020-05-27 LAB — C-REACTIVE PROTEIN: CRP: 1.6 mg/dL — ABNORMAL HIGH (ref <1.0)

## 2020-05-27 MED ORDER — FUROSEMIDE 10 MG/ML IJ SOLN
60.0000 mg | Freq: Once | INTRAMUSCULAR | Status: AC
  Administered 2020-05-27: 60 mg via INTRAVENOUS
  Filled 2020-05-27: qty 6

## 2020-05-27 NOTE — Progress Notes (Signed)
PT Cancellation Note  Patient Details Name: Audrey Gross MRN: [id number] DOB: [birth year]   Cancelled Treatment:     PT eval deferred at request of RN - pt desating with min repositioning in bed.  Will follow.   Chrishawn Kring 05/27/2020, 12:50 PM

## 2020-05-27 NOTE — Progress Notes (Signed)
PROGRESS NOTE    Audrey Gross  GLO:[id number] DOB: Feb 09, 1961 DOA: 05/07/2020 PCP: Janith Lima, MD   Chief Complaint  Patient presents with  . COVID Positive    Brief Narrative:  Audrey Gross is a 59 y.o. 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 with her last treatment about 2 weeks prior to admission, paroxysmal atrial fibrillation on Eliquis, obesity history of nonischemic cardiomyopathy with an EF of 35% back in 2019 repeated 2D echo that showed an improvement in ejection fraction to 50%, MR, Recently discharged from the hospital on 05/12/2020 and treated for COVID-19 infection she went home off oxygen comes in again for shortness of breath that started about 5 days prior to admission.  She was readmitted with acute hypoxic respiratory failure after recent discharge after treatment for COVID infection.   Assessment & Plan:   Acute respiratory failure with hypoxia secondary to pneumonia due to COVID-19: Apparently vaccinated against COVID-19 in July. Tested positive on 8/2 S/p steroids/remdesivir course during last hospital admission. She had to be rehospitalized due to acute respiratory failure with hypoxia.  She was started back on another course of Remdesivir.  She has completed 5 days.  She remains on steroids.  She was also placed on baricitinib.  Due to elevated procalcitonin she was also on antibacterial agents.  She has completed a 5-day course. Patient's respiratory status seems to be gradually improving.  It looks like she was turned down to 25 L on the heated high flow yesterday.  This morning patient was found to be without her oxygen on saturating in the 60s.  She was noted to be very anxious and panicking.  She was placed back on her oxygen along with a nonrebreather.  Heated high flow was turned up to 35 L.  She stabilized within a few minutes saturating in the early 90s.  Nursing staff was notified. Continue to wean down oxygen as tolerated.  Maintain sats  between 85-90 as long as no increased work of breathing is noted. Her inflammatory markers had improved.  D-dimer was also noted to be elevated for which he underwent lower extremity Doppler study which was negative for DVT.  She was on Eliquis for history of atrial fibrillation but was changed over to therapeutic Lovenox which will be continued. Patient was given furosemide which seems to be helping.  Furosemide was held yesterday due to increase in creatinine.  Creatinine is better today.  We will give her additional dose today.  Continue to monitor labs daily.  She is on Entresto and Bactrim which may have to be held if creatinine continues to rise.  Elevated Troponin: Elevated likely secondary to Covid.  Low suspicion for ACS.  EKG from admission showed a baseline wander and was poor quality.  Troponins are now normal.  Echocardiogram shows normal EF with no wall motion abnormalities.  Do not anticipate any further cardiac work-up at this time.  Normal anion gap metabolic acidosis  Appears to have resolved.  Continue to monitor.  Paroxysmal atrial fibrillation: Continue metoprolol.  Anticoagulated. Heart rate is reasonably well controlled.  Normocytic anemia Mild drop in hemoglobin is likely dilutional. No evidence of overt bleeding.  Recheck labs tomorrow.  Poor PO intake Encourage oral intake.  Mobilize.    Odynophagia Looks like she was being treated empirically with fluconazole for candidal esophagitis.    CLL (chronic lymphocytic leukemia) Chemotherapy on hold. Continue bactrim and valganciclovir  Nonischemic cardiomyopathy  Chronic systolic heart failure  Continue metoprolol,  entresto.  Echocardiogram shows normal systolic function.  Transaminitis: Likely due to COVID-19.  Now normal.    Hypokalemia Repleted.  Hyperglycemia Likely due to steroids.  HbA1c 5.7.  CBGs are reasonably well controlled.  DVT prophylaxis: Full dose Lovenox Code Status: Full code Family  Communication: Husband being updated daily Disposition: Hopefully return home when improved.  PT and OT evaluation.  Status is: Inpatient  Remains inpatient appropriate because:IV treatments appropriate due to intensity of illness or inability to take PO and Inpatient level of care appropriate due to severity of illness   Dispo:  Patient From: Home  Planned Disposition: To be determined  Expected discharge date: 05/30/20  Medically stable for discharge: No      Consultants:   PCCM  Procedures:   none  Antimicrobials:  Anti-infectives (From admission, onward)   Start     Dose/Rate Route Frequency Ordered Stop   05/21/20 1300  sulfamethoxazole-trimethoprim (BACTRIM) 400-80 MG per tablet 1 tablet        1 tablet Oral Daily 05/21/20 1147     05/21/20 1000  remdesivir 100 mg in sodium chloride 0.9 % 100 mL IVPB       "Followed by" Linked Group Details   100 mg 200 mL/hr over 30 Minutes Intravenous Daily 05/18/2020 1333 05/24/20 1039   05/21/20 0900  ceFEPIme (MAXIPIME) 2 g in sodium chloride 0.9 % 100 mL IVPB        2 g 200 mL/hr over 30 Minutes Intravenous Every 8 hours 05/21/20 0832 05/26/20 0922   05/30/2020 2200  valGANciclovir (VALCYTE) 450 MG tablet TABS 900 mg        900 mg Oral Daily 05/09/2020 1421     05/12/2020 2100  vancomycin (VANCOCIN) IVPB 1000 mg/200 mL premix  Status:  Discontinued        1,000 mg 200 mL/hr over 60 Minutes Intravenous Every 8 hours 05/10/2020 1239 05/21/20 1055   06/03/2020 1430  fluconazole (DIFLUCAN) tablet 150 mg        150 mg Oral Daily 05/30/2020 1421 05/26/20 2359   05/21/2020 1430  cefTRIAXone (ROCEPHIN) 2 g in sodium chloride 0.9 % 100 mL IVPB  Status:  Discontinued        2 g 200 mL/hr over 30 Minutes Intravenous Every 24 hours 05/15/2020 1421 05/21/20 0815   05/14/2020 1400  azithromycin (ZITHROMAX) 500 mg in sodium chloride 0.9 % 250 mL IVPB        500 mg 250 mL/hr over 60 Minutes Intravenous Every 24 hours 05/13/2020 1337 05/24/20 1515   05/16/2020  1333  remdesivir 200 mg in sodium chloride 0.9% 250 mL IVPB       "Followed by" Linked Group Details   200 mg 580 mL/hr over 30 Minutes Intravenous Once 05/23/2020 1333 05/08/2020 1614   05/18/2020 1245  vancomycin (VANCOREADY) IVPB 1500 mg/300 mL        1,500 mg 150 mL/hr over 120 Minutes Intravenous  Once 05/29/2020 1239 06/02/2020 1812   06/05/2020 1215  ceFEPIme (MAXIPIME) 2 g in sodium chloride 0.9 % 100 mL IVPB        2 g 200 mL/hr over 30 Minutes Intravenous  Once 06/05/2020 1213 06/03/2020 1350     Subjective: Patient noted to be extremely anxious this morning.  She was noted to have low oxygen saturations.  After she was stabilized she improved.  Denies any chest pain.  Objective: Vitals:   05/27/20 0400 05/27/20 0407 05/27/20 0438 05/27/20 0740  BP:  140/87  Pulse:  79    Resp:  (!) 35    Temp: 98.2 F (36.8 C)     TempSrc: Axillary     SpO2:  93%  (!) 85%  Weight:   76.3 kg   Height:        Intake/Output Summary (Last 24 hours) at 05/27/2020 1113 Last data filed at 05/27/2020 0500 Gross per 24 hour  Intake 10 ml  Output 950 ml  Net -940 ml   Filed Weights   05/25/20 0429 05/26/20 0453 05/27/20 0438  Weight: 77.5 kg 77.1 kg 76.3 kg    Examination:  General appearance: Awake alert.  Anxious. Resp: Quite tachypneic when she was hypoxic.  Respiratory effort improved after oxygen saturations increased into the 90s.  Few crackles bilateral bases.  No wheezing or rhonchi. Cardio: S1-S2 is normal regular.  No S3-S4.  No rubs murmurs or bruit GI: Abdomen is soft.  Nontender nondistended.  Bowel sounds are present normal.  No masses organomegaly Extremities: No edema.  Moving all extremities Neurologic:  No focal neurological deficits.      Data Reviewed: I have personally reviewed following labs and imaging studies  CBC: Recent Labs  Lab 05/21/20 0545 05/22/20 0525 05/23/20 0349 05/24/20 0300 05/25/20 0506  WBC 9.6 12.4* 9.8 7.4 8.2  NEUTROABS 2.5 2.9 1.6* 1.1* 1.0*    HGB 11.3* 11.6* 11.2* 10.8* 11.0*  HCT 35.2* 36.0 34.9* 33.9* 33.6*  MCV 87.8 88.5 87.9 88.1 87.7  PLT 213 240 189 153 144*    Basic Metabolic Panel: Recent Labs  Lab 05/22/20 0525 05/22/20 1915 05/23/20 0349 05/24/20 0300 05/25/20 0506 05/26/20 0500 05/27/20 0445  NA   < >  --  142 142 145 139 144  K   < >  --  3.6 4.4 4.3 4.5 4.3  CL   < >  --  108 111 111 98 112*  CO2   < >  --  20* 21* 23 28 20*  GLUCOSE   < >  --  232* 154* 170* 87 272*  BUN   < >  --  28* 31* 37* 51* 36*  CREATININE   < >  --  0.63 0.62 0.64 1.05* 0.74  CALCIUM   < >  --  8.5* 8.4* 8.7* 9.5 8.8*  MG  --  2.2  --   --   --   --   --    < > = values in this interval not displayed.    GFR: Estimated Creatinine Clearance: 80.7 mL/min (by C-G formula based on SCr of 0.74 mg/dL).  Liver Function Tests: Recent Labs  Lab 05/23/20 0349 05/24/20 0300 05/25/20 0506 05/26/20 0500 05/27/20 0445  AST 20 22 22 18  38  ALT 29 29 30 23  41  ALKPHOS 51 61 80 86 98  BILITOT 0.7 0.7 0.9 0.4 0.8  PROT 5.9* 5.6* 5.5* 7.3 5.4*  ALBUMIN 2.4* 2.4* 2.5* 3.2* 2.6*    CBG: Recent Labs  Lab 05/26/20 0746 05/26/20 1217 05/26/20 1703 05/26/20 2107 05/27/20 0750  GLUCAP 173* 148* 155* 162* 126*     Recent Results (from the past 240 hour(s))  Culture, blood (Routine x 2)     Status: None   Collection Time: 06/01/2020 10:14 AM   Specimen: BLOOD  Result Value Ref Range Status   Specimen Description   Final    BLOOD PORTA CATH Performed at [facility] [address], [place], [place] [address]    Special  Requests   Final    BOTTLES DRAWN AEROBIC AND ANAEROBIC Blood Culture adequate volume Performed at [facility] [address] [place], [place] [address]    Culture   Final    NO GROWTH 5 DAYS Performed at [facility], [place] [address] [place], [place] [address]    Report Status 05/25/2020 FINAL  Final  MRSA PCR Screening     Status: None   Collection Time:  05/21/20  4:32 AM   Specimen: Nasal Mucosa; Nasopharyngeal  Result Value Ref Range Status   MRSA by PCR NEGATIVE NEGATIVE Final    Comment:        The GeneXpert MRSA Assay (FDA approved for NASAL specimens only), is one component of a comprehensive MRSA colonization surveillance program. It is not intended to diagnose MRSA infection nor to guide or monitor treatment for MRSA infections. Performed at [facility], [place] [address], [place], [place] [address]          Radiology Studies: No results found.   Scheduled Meds: . baricitinib  4 mg Oral Daily  . Chlorhexidine Gluconate Cloth  6 each Topical Daily  . diclofenac Sodium  2 g Topical QID  . enoxaparin (LOVENOX) injection  75 mg Subcutaneous Q12H  . feeding supplement (ENSURE ENLIVE)  237 mL Oral BID BM  . insulin aspart  0-9 Units Subcutaneous TID WC  . insulin detemir  5 Units Subcutaneous BID  . Ipratropium-Albuterol  1 puff Inhalation QID  . mouth rinse  15 mL Mouth Rinse BID  . methylPREDNISolone (SOLU-MEDROL) injection  45 mg Intravenous Q12H  . metoprolol succinate  150 mg Oral Daily  . multivitamin with minerals  1 tablet Oral Daily  . pantoprazole  40 mg Oral BID  . sacubitril-valsartan  1 tablet Oral BID  . sodium chloride flush  10-40 mL Intracatheter Q12H  . sulfamethoxazole-trimethoprim  1 tablet Oral Daily  . valGANciclovir  900 mg Oral Daily   Continuous Infusions: . sodium chloride       LOS: 7 days     Bonnielee Haff, MD [facility]   To contact the attending provider between 7A-7P or the covering provider during after hours 7P-7A, please log into the web site [email or web address] and access using universal [place] password for that web site. If you do not have the password, please call the hospital operator.  05/27/2020, 11:13 AM

## 2020-05-27 NOTE — Progress Notes (Signed)
OT Cancellation Note  Patient Details Name: Audrey Gross MRN: [id number] DOB: January 07, 1961   Cancelled Treatment:    Reason Eval/Treat Not Completed: Patient not medically ready. Will continue to follow.  [name] 05/27/2020, 12:39 PM  Nestor Lewandowsky, OTR/L Acute Rehabilitation Services Pager: [phone] Office: [phone]

## 2020-05-28 ENCOUNTER — Other Ambulatory Visit: Payer: BC Managed Care – PPO

## 2020-05-28 ENCOUNTER — Inpatient Hospital Stay (HOSPITAL_COMMUNITY): Payer: BC Managed Care – PPO

## 2020-05-28 ENCOUNTER — Ambulatory Visit: Payer: BC Managed Care – PPO | Admitting: Hematology and Oncology

## 2020-05-28 DIAGNOSIS — F419 Anxiety disorder, unspecified: Secondary | ICD-10-CM

## 2020-05-28 LAB — CBC
HCT: 37.3 % (ref 36.0–46.0)
Hemoglobin: 11.7 g/dL — ABNORMAL LOW (ref 12.0–15.0)
MCH: 28.1 pg (ref 26.0–34.0)
MCHC: 31.4 g/dL (ref 30.0–36.0)
MCV: 89.4 fL (ref 80.0–100.0)
Platelets: 130 10*3/uL — ABNORMAL LOW (ref 150–400)
RBC: 4.17 MIL/uL (ref 3.87–5.11)
RDW: 14.9 % (ref 11.5–15.5)
WBC: 10.7 10*3/uL — ABNORMAL HIGH (ref 4.0–10.5)
nRBC: 0 % (ref 0.0–0.2)

## 2020-05-28 LAB — COMPREHENSIVE METABOLIC PANEL
ALT: 38 U/L (ref 0–44)
AST: 26 U/L (ref 15–41)
Albumin: 3 g/dL — ABNORMAL LOW (ref 3.5–5.0)
Alkaline Phosphatase: 94 U/L (ref 38–126)
Anion gap: 11 (ref 5–15)
BUN: 40 mg/dL — ABNORMAL HIGH (ref 6–20)
CO2: 24 mmol/L (ref 22–32)
Calcium: 9.2 mg/dL (ref 8.9–10.3)
Chloride: 110 mmol/L (ref 98–111)
Creatinine, Ser: 0.69 mg/dL (ref 0.44–1.00)
GFR calc Af Amer: >60 mL/min (ref >60)
GFR calc non Af Amer: >60 mL/min (ref >60)
Glucose, Bld: 147 mg/dL — ABNORMAL HIGH (ref 70–99)
Potassium: 4.3 mmol/L (ref 3.5–5.1)
Sodium: 145 mmol/L (ref 135–145)
Total Bilirubin: 1 mg/dL (ref 0.3–1.2)
Total Protein: 5.8 g/dL — ABNORMAL LOW (ref 6.5–8.1)

## 2020-05-28 LAB — GLUCOSE, CAPILLARY
Glucose-Capillary: 117 mg/dL — ABNORMAL HIGH (ref 70–99)
Glucose-Capillary: 79 mg/dL (ref 70–99)
Glucose-Capillary: 166 mg/dL — ABNORMAL HIGH (ref 70–99)
Glucose-Capillary: 172 mg/dL — ABNORMAL HIGH (ref 70–99)

## 2020-05-28 LAB — D-DIMER, QUANTITATIVE: D-Dimer, Quant: 1.88 ug/mL-FEU — ABNORMAL HIGH (ref 0.00–0.50)

## 2020-05-28 LAB — C-REACTIVE PROTEIN: CRP: 2.1 mg/dL — ABNORMAL HIGH (ref <1.0)

## 2020-05-28 MED ORDER — CLONAZEPAM 0.5 MG PO TABS
0.5000 mg | ORAL_TABLET | Freq: Two times a day (BID) | ORAL | Status: DC
  Administered 2020-05-28 – 2020-06-03 (×14): 0.5 mg via ORAL
  Filled 2020-05-28 (×14): qty 1

## 2020-05-28 MED ORDER — FUROSEMIDE 10 MG/ML IJ SOLN: 40.0000 mg | Freq: Once | INTRAMUSCULAR | Status: DC

## 2020-05-28 MED ORDER — FUROSEMIDE 10 MG/ML IJ SOLN
60.0000 mg | Freq: Once | INTRAMUSCULAR | Status: AC
  Administered 2020-05-28: 60 mg via INTRAVENOUS
  Filled 2020-05-28: qty 6

## 2020-05-28 NOTE — TOC Progression Note (Signed)
Transition of Care [facility]) - Progression Note    Patient Details  Name: Audrey Gross MRN: [id number] Date of Birth: October 16, 1960  Transition of Care [facility]) CM/SW Contact  Leeroy Cha, RN Phone Number: 05/28/2020, 10:31 AM  Clinical Narrative:    hfnc at 30l/min, iv solu medo,po bactrim, po valcyte, d.dimer and crp levels are still elevated.'following for progression and toc needs, may snf placement due to condition.   Expected Discharge Plan: Home/Self Care Barriers to Discharge: Continued Medical Work up  Expected Discharge Plan and Services Expected Discharge Plan: Home/Self Care   Discharge Planning Services: CM Consult   Living arrangements for the past 2 months: Single Family Home                                       Social Determinants of Health (SDOH) Interventions    Readmission Risk Interventions No flowsheet data found.

## 2020-05-28 NOTE — Plan of Care (Signed)
  Problem: Health Behavior/Discharge Planning: Goal: Ability to manage health-related needs will improve Outcome: Progressing   Problem: Clinical Measurements: Goal: Ability to maintain clinical measurements within normal limits will improve Outcome: Progressing Goal: Will remain free from infection Outcome: Progressing Goal: Diagnostic test results will improve Outcome: Progressing Goal: Respiratory complications will improve Outcome: Progressing Goal: Cardiovascular complication will be avoided Outcome: Progressing   Problem: Activity: Goal: Risk for activity intolerance will decrease Outcome: Progressing   Problem: Nutrition: Goal: Adequate nutrition will be maintained Outcome: Progressing   Problem: Coping: Goal: Level of anxiety will decrease Outcome: Progressing   Problem: Elimination: Goal: Will not experience complications related to bowel motility Outcome: Progressing Goal: Will not experience complications related to urinary retention Outcome: Progressing   Problem: Pain Managment: Goal: General experience of comfort will improve Outcome: Progressing   Problem: Safety: Goal: Ability to remain free from injury will improve Outcome: Progressing   Problem: Skin Integrity: Goal: Risk for impaired skin integrity will decrease Outcome: Progressing   Problem: Coping: Goal: Psychosocial and spiritual needs will be supported Outcome: Progressing   Problem: Respiratory: Goal: Will maintain a patent airway Outcome: Progressing Goal: Complications related to the disease process, condition or treatment will be avoided or minimized Outcome: Progressing

## 2020-05-28 NOTE — Progress Notes (Signed)
Physical Therapy Evaluation Patient Details Name: Audrey Gross MRN: [id number] DOB: 12-Dec-1960 Today's Date: 05/28/2020   History of Present Illness  Audrey Gross is a 59 y.o. 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 with her last treatment about 2 weeks prior to admission, paroxysmal atrial fibrillation on Eliquis, obesity history of nonischemic cardiomyopathy with an EF of 35% back in 2019 repeated 2D echo that showed an improvement in ejection fraction to 50%, MR, Recently discharged from the hospital on 05/12/2020 and treated for COVID-19 infection, home off oxygen comes in 05/30/2020 with respiratory distress.  Clinical Impression  Evaluation limited due to patient's Dyspnea, anxiousness. Patient on NRB and 85% FiO2, 70 L. Patient sat on bed edge, very anxious. Pleth  For SPO2 not clear consistent waveform, intermittently from 70's to 100%( finger sensor). Patient began to return self back to bed. Patient will benefit from progressive activity to improve respiratory status as tolerated. Pt admitted with above diagnosis.  Pt currently with functional limitations due to the deficits listed below (see PT Problem List). Pt will benefit from skilled PT to increase their independence and safety with mobility to allow discharge to the venue listed below.       Follow Up Recommendations Home health PT    Equipment Recommendations   (tba)    Recommendations for Other Services       Precautions / Restrictions        Mobility  Bed Mobility Overal bed mobility: Needs Assistance Bed Mobility: Supine to Sit;Sit to Supine     Supine to sit: Min assist Sit to supine: Min guard   General bed mobility comments: with encouragement, patient assisted to sitting  on bed edge. Patient remained on Partial NRB and HHFNC. patient sat  for ~ 8 minutes, encouraged to slow breathing.  Transfers                 General transfer comment: TBA  Ambulation/Gait                 Stairs            Wheelchair Mobility    Modified Rankin (Stroke Patients Only)       Balance Overall balance assessment: Needs assistance Sitting-balance support: Feet unsupported;No upper extremity supported Sitting balance-Leahy Scale: Fair         Standing balance comment: NT                             Pertinent Vitals/Pain Pain Assessment: No/denies pain    Home Living                        Prior Function                 Hand Dominance        Extremity/Trunk Assessment        Lower Extremity Assessment Lower Extremity Assessment: Generalized weakness    Cervical / Trunk Assessment Cervical / Trunk Assessment: Normal  Communication      Cognition Arousal/Alertness: Awake/alert Behavior During Therapy: Anxious Overall Cognitive Status: No family/caregiver present to determine baseline cognitive functioning                                 General Comments: pt limited in answers, very anxious , haS nrb AND HHFNC  General Comments      Exercises     Assessment/Plan    PT Assessment Patient needs continued PT services  PT Problem List Decreased activity tolerance;Cardiopulmonary status limiting activity;Decreased mobility;Decreased safety awareness;Decreased knowledge of use of DME       PT Treatment Interventions DME instruction;Gait training;Functional mobility training;Therapeutic activities;Therapeutic exercise;Patient/family education    PT Goals (Current goals can be found in the Care Plan section)  Acute Rehab PT Goals Patient Stated Goal: none stated PT Goal Formulation: With patient Time For Goal Achievement: 06/11/20 Potential to Achieve Goals: Fair    Frequency Min 3X/week   Barriers to discharge        Co-evaluation PT/OT/SLP Co-Evaluation/Treatment: Yes Reason for Co-Treatment: Complexity of the patient's impairments (multi-system involvement);For patient/therapist  safety;To address functional/ADL transfers PT goals addressed during session: Mobility/safety with mobility OT goals addressed during session: ADL's and self-care       AM-PAC PT "6 Clicks" Mobility  Outcome Measure Help needed turning from your back to your side while in a flat bed without using bedrails?: A Little Help needed moving from lying on your back to sitting on the side of a flat bed without using bedrails?: A Little Help needed moving to and from a bed to a chair (including a wheelchair)?: A Lot Help needed standing up from a chair using your arms (e.g., wheelchair or bedside chair)?: A Lot Help needed to walk in hospital room?: Total Help needed climbing 3-5 steps with a railing? : Total 6 Click Score: 12    End of Session Equipment Utilized During Treatment: Oxygen Activity Tolerance: Treatment limited secondary to medical complications (Comment) Patient left: in bed;with call bell/phone within reach Nurse Communication: Mobility status PT Visit Diagnosis: Difficulty in walking, not elsewhere classified (R26.2)    Time: 4097-3532 PT Time Calculation (min) (ACUTE ONLY): 31 min   Charges:   PT Evaluation $PT Eval Moderate Complexity: [place] Pager [phone] Office [phone]  [name] 05/28/2020, 1:15 PM

## 2020-05-28 NOTE — Progress Notes (Signed)
[facility] for Lovenox Indication: atrial fibrillation  No Known Allergies  Patient Measurements: Height: 5\' 7"  (170.2 cm) Weight: 76.3 kg (168 lb 3.4 oz) IBW/kg (Calculated) : 61.6 Heparin Dosing Weight:   Vital Signs: Temp: 97.5 F (36.4 C) (08/23 0400) Temp Source: Axillary (08/23 0400) BP: 119/83 (08/23 0800) Pulse Rate: 59 (08/23 0800)  Labs: Recent Labs    05/26/20 0500 05/27/20 0445 05/28/20 0500  HGB  --   --  11.7*  HCT  --   --  37.3  PLT  --   --  130*  CREATININE 1.05* 0.74 0.69    Estimated Creatinine Clearance: 80.7 mL/min (by C-G formula based on SCr of 0.69 mg/dL).   Medical History: Past Medical History:  Diagnosis Date  . Atrial fibrillation ([place])   . Cystic breast   . Depression   . Diffuse cystic mastopathy   . Dyslipidemia (high LDL; low HDL)   . Hypertension   . Iron deficiency anemia, unspecified   . Lymphadenopathy of head and neck 04/05/2015  . Medical non-compliance   . Mitral regurgitation 02/2017   moderate to severe  . Persistent atrial fibrillation with rapid ventricular response ([place]) 07/15/2017    Medications:  Scheduled:  . baricitinib  4 mg Oral Daily  . Chlorhexidine Gluconate Cloth  6 each Topical Daily  . diclofenac Sodium  2 g Topical QID  . enoxaparin (LOVENOX) injection  75 mg Subcutaneous Q12H  . feeding supplement (ENSURE ENLIVE)  237 mL Oral BID BM  . insulin aspart  0-9 Units Subcutaneous TID WC  . insulin detemir  5 Units Subcutaneous BID  . Ipratropium-Albuterol  1 puff Inhalation QID  . mouth rinse  15 mL Mouth Rinse BID  . methylPREDNISolone (SOLU-MEDROL) injection  45 mg Intravenous Q12H  . metoprolol succinate  150 mg Oral Daily  . multivitamin with minerals  1 tablet Oral Daily  . pantoprazole  40 mg Oral BID  . sacubitril-valsartan  1 tablet Oral BID  . sodium chloride flush  10-40 mL Intracatheter Q12H  . sulfamethoxazole-trimethoprim  1 tablet Oral Daily  .  valGANciclovir  900 mg Oral Daily   Infusions:  . sodium chloride      Assessment: 50 yoF re-admitted on 8/15 with COVID-19 pneumonia.  PMH includes Afib on Eliquis prior to admission.  Pharmacy is now consulted to dose Lovenox. 8/16 LE Dopplers: negative for DVT Most recent Eliquis dose on 8/18 at 10:15.  05/28/2020 SCr 0.69 CBC: Hgb 11.7, Plt 139 D-dimer down to 1.88 No bleeding noted  Goal of Therapy:  Anti-Xa level 0.6-1 units/ml 4hrs after LMWH dose given Monitor platelets by anticoagulation protocol: Yes   Plan:  Continue Lovenox 1 mg/kg [place] q12h  Monitor renal function, CBC, D-dimer, imaging, weight, s/s bleeding/thrombosis.  Dolly Rias RPh 05/28/2020, 11:00 AM

## 2020-05-28 NOTE — Progress Notes (Signed)
Occupational Therapy Evaluation  Patient with functional deficits listed below impacting safety and independence with self care. Patient min A and max encouragement with bed mobility, once seated EOB patient holding onto non rebreather mask with shallow rapid breathing. Provide max encouragement and educate patient on pursed lip breathing strategies with patient moaning throughout eventually stating "I can't." Patient tolerate sitting EOB for ~54min before initiating lying herself back into bed. Patient with decreased strength, activity tolerance, balance, safety awareness. Patient unable to provide details regarding home set up / available help at discharge, pending improvement with mobility patient may be able to D/C with [facility], otherwise may require rehab to maximize strength and activity tolerance needed for participation with self care. Will continue to follow.    05/28/20 1400  OT Visit Information  Last OT Received On 05/28/20  Assistance Needed +2  PT/OT/SLP Co-Evaluation/Treatment Yes  Reason for Co-Treatment Complexity of the patient's impairments (multi-system involvement);For patient/therapist safety;To address functional/ADL transfers;Necessary to address cognition/behavior during functional activity  OT goals addressed during session ADL's and self-care  History of Present Illness Audrey Gross is a 59 y.o. 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 with her last treatment about 2 weeks prior to admission, paroxysmal atrial fibrillation on Eliquis, obesity history of nonischemic cardiomyopathy with an EF of 35% back in 2019 repeated 2D echo that showed an improvement in ejection fraction to 50%, MR, Recently discharged from the hospital on 05/12/2020 and treated for COVID-19 infection, home off oxygen comes in 05/10/2020 with respiratory distress.  Precautions  Precautions Fall  Precaution Comments anxious with mobility  Restrictions  Weight Bearing Restrictions No  Home Living   Family/patient expects to be discharged to: Private residence  Living Arrangements Spouse/significant other;Children  Additional Comments patient very vague with answers, is anxious throughout evaluation.   Prior Function  Comments limited verbalization throughout evaluation, moaning/anxious  Communication  Communication Expressive difficulties  Pain Assessment  Pain Assessment Faces  Faces Pain Scale 0  Pain Intervention(s) Monitored during session (patient moaning when asked if anything hurts states no)  Cognition  Arousal/Alertness Awake/alert  Behavior During Therapy Anxious  Overall Cognitive Status No family/caregiver present to determine baseline cognitive functioning  General Comments pt with limited answers, very anxious, moaning/yelling out while seated EOB  Upper Extremity Assessment  Upper Extremity Assessment Generalized weakness  Lower Extremity Assessment  Lower Extremity Assessment Defer to PT evaluation  Cervical / Trunk Assessment  Cervical / Trunk Assessment Normal  ADL  Overall ADL's  Needs assistance/impaired  Grooming Sitting;Minimal assistance  Upper Body Bathing Minimal assistance;Sitting  Lower Body Bathing Maximal assistance;Sitting/lateral leans  Upper Body Dressing  Minimal assistance;Sitting  Lower Body Dressing Maximal assistance;Sitting/lateral leans  Lower Body Dressing Details (indicate cue type and reason) limited activity tolerance, highly anxious  Toilet Transfer Details (indicate cue type and reason) did not attempt this session as patient very anxious at EOB, MD present state sitting EOB "good first step"  Toileting- Clothing Manipulation and Hygiene Maximal assistance;Sitting/lateral lean;Bed level  Functional mobility during ADLs Minimal assistance;+2 for physical assistance;+2 for safety/equipment (bed mobility)  General ADL Comments patient requiring increased assistance with self care due to anxiety, decreased activity tolerance, poor  carry over of pursed lip breathing techniques  Bed Mobility  Overal bed mobility Needs Assistance  Bed Mobility Supine to Sit;Sit to Supine  Supine to sit Min assist  Sit to supine Min guard  General bed mobility comments provided patient encouragement Min A to sitting bed edge. Patient remained  on Partial NRB and HHFNC. patient sat  for ~ 8 minutes, encouraged PLB techniques/slow breathing.  Transfers  General transfer comment deferred due to highly anxious  Balance  Overall balance assessment Needs assistance  Sitting-balance support Feet unsupported;No upper extremity supported  Sitting balance-Leahy Scale Fair  General Comments  General comments (skin integrity, edema, etc.) lowest O2 reading observed 83% on 30L O2 85% FIO2 non rebreather and HHFNC, difficulty maintaining good pleth wave due to pt tremulous/anxiously moving UEs  OT - End of Session  Equipment Utilized During Treatment Oxygen  Activity Tolerance Other (comment) (patient limited by anxiety)  Patient left in bed;with call bell/phone within reach  Nurse Communication Mobility status  OT Assessment  OT Recommendation/Assessment Patient needs continued OT Services  OT Visit Diagnosis Other abnormalities of gait and mobility (R26.89);Muscle weakness (generalized) (M62.81)  OT Problem List Decreased strength;Decreased activity tolerance;Impaired balance (sitting and/or standing);Decreased safety awareness;Cardiopulmonary status limiting activity  OT Plan  OT Frequency (ACUTE ONLY) Min 2X/week  OT Treatment/Interventions (ACUTE ONLY) Self-care/ADL training;Therapeutic exercise;Energy conservation;DME and/or AE instruction;Therapeutic activities;Patient/family education;Balance training  AM-PAC OT "6 Clicks" Daily Activity Outcome Measure (Version 2)  Help from another person eating meals? 3  Help from another person taking care of personal grooming? 3  Help from another person toileting, which includes using toliet, bedpan,  or urinal? 1  Help from another person bathing (including washing, rinsing, drying)? 2  Help from another person to put on and taking off regular upper body clothing? 3  Help from another person to put on and taking off regular lower body clothing? 2  6 Click Score 14  OT Recommendation  Follow Up Recommendations Home health OT;SNF;Supervision/Assistance - 24 hour (HH vs SNF pending patient progress)  OT Equipment Other (comment) (TBD)  Individuals Consulted  Consulted and Agree with Results and Recommendations Patient  Acute Rehab OT Goals  Patient Stated Goal none stated  OT Goal Formulation Patient unable to participate in goal setting (very anxious)  Time For Goal Achievement 06/11/20  Potential to Achieve Goals Good  OT Time Calculation  OT Start Time (ACUTE ONLY) 0850  OT Stop Time (ACUTE ONLY) 5284  OT Time Calculation (min) 28 min  OT General Charges  $OT Visit 1 Visit  OT Evaluation  $OT Eval Moderate Complexity 1 Mod  Written Expression  Dominant Hand  (did not specify)   Delbert Phenix OT OT pager: [phone]

## 2020-05-28 NOTE — Progress Notes (Signed)
PROGRESS NOTE    Audrey Gross  MVH:[id number] DOB: 1960/12/13 DOA: 05/19/2020 PCP: Janith Lima, MD   Chief Complaint  Patient presents with  . COVID Positive    Brief Narrative:  Audrey Gross is a 59 y.o. 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 with her last treatment about 2 weeks prior to admission, paroxysmal atrial fibrillation on Eliquis, obesity history of nonischemic cardiomyopathy with an EF of 35% back in 2019 repeated 2D echo that showed an improvement in ejection fraction to 50%, MR, Recently discharged from the hospital on 05/12/2020 and treated for COVID-19 infection she went home off oxygen comes in again for shortness of breath that started about 5 days prior to admission.  She was readmitted with acute hypoxic respiratory failure after recent discharge after treatment for COVID infection.   Assessment & Plan:   Acute respiratory failure with hypoxia secondary to pneumonia due to COVID-19: Apparently vaccinated against COVID-19 in July. Tested positive on 8/2 S/p steroids/remdesivir course during last hospital admission. She had to be rehospitalized due to acute respiratory failure with hypoxia.  She was started back on another course of Remdesivir.  She has completed 5 days.  She remains on steroids.  She was also placed on baricitinib.  Due to elevated procalcitonin she was also on antibacterial agents.  She has completed a 5-day course. Patient's respiratory status is stable.  She tends to get very very anxious and has not been cooperating with nursing attempts to mobilize.  She tends to panic whenever they try to set her up.  At the same time she does tend to get very hypoxic even with minimal exertion. Patient remains on heated high flow at 30 L/min along with nonrebreather at times.  Saturating mainly in the 90s. Continue to wean down oxygen as tolerated.  We will place her on anxiolytic medications. Her inflammatory markers had improved. D-dimer was  noted to be elevated.  Lower extremity Doppler studies were negative for DVT.  She was already on Eliquis for history of A. fib.  She was switched over to Lovenox which is being continued. Patient has been given furosemide the last few days.  Will repeat another dose today.  Chest x-ray done today does show worsening opacities but this is compared to x-ray that was done on 8/17.  Elevated Troponin: Elevated likely secondary to Covid.  Low suspicion for ACS.  EKG from admission showed a baseline wander and was poor quality.  Troponins are now normal.  Echocardiogram shows normal EF with no wall motion abnormalities.  Do not anticipate any further cardiac work-up at this time.  Normal anion gap metabolic acidosis  Appears to have resolved.  Continue to monitor.  Paroxysmal atrial fibrillation: Continue metoprolol.  Anticoagulated. Heart rate is reasonably well controlled.  Severe anxiety We will start her on scheduled Klonopin.  Normocytic anemia Mild drop in hemoglobin is likely dilutional. No evidence of overt bleeding.  Hemoglobin is stable.  Platelet count noted to be low but stable.  Poor PO intake Encourage oral intake.  Mobilize.    Odynophagia Looks like she was being treated empirically with fluconazole for candidal esophagitis.    CLL (chronic lymphocytic leukemia) Chemotherapy on hold. Continue bactrim and valganciclovir  Nonischemic cardiomyopathy  Chronic systolic heart failure  Continue metoprolol, entresto.  Echocardiogram shows normal systolic function.  Transaminitis: Likely due to COVID-19.  Now normal.    Hypokalemia Repleted.  Hyperglycemia Likely due to steroids.  HbA1c 5.7.  CBGs are  reasonably well controlled.  DVT prophylaxis: Full dose Lovenox Code Status: Full code Family Communication: Husband being updated daily Disposition: Hopefully return home when improved.  PT and OT evaluation.  Status is: Inpatient  Remains inpatient appropriate  because:IV treatments appropriate due to intensity of illness or inability to take PO and Inpatient level of care appropriate due to severity of illness   Dispo:  Patient From: Home  Planned Disposition: To be determined  Expected discharge date: 05/30/20  Medically stable for discharge: No      Consultants:   PCCM  Procedures:   none  Antimicrobials:  Anti-infectives (From admission, onward)   Start     Dose/Rate Route Frequency Ordered Stop   05/21/20 1300  sulfamethoxazole-trimethoprim (BACTRIM) 400-80 MG per tablet 1 tablet        1 tablet Oral Daily 05/21/20 1147     05/21/20 1000  remdesivir 100 mg in sodium chloride 0.9 % 100 mL IVPB       "Followed by" Linked Group Details   100 mg 200 mL/hr over 30 Minutes Intravenous Daily 05/27/2020 1333 05/24/20 1039   05/21/20 0900  ceFEPIme (MAXIPIME) 2 g in sodium chloride 0.9 % 100 mL IVPB        2 g 200 mL/hr over 30 Minutes Intravenous Every 8 hours 05/21/20 0832 05/26/20 0922   05/13/2020 2200  valGANciclovir (VALCYTE) 450 MG tablet TABS 900 mg        900 mg Oral Daily 05/16/2020 1421     06/02/2020 2100  vancomycin (VANCOCIN) IVPB 1000 mg/200 mL premix  Status:  Discontinued        1,000 mg 200 mL/hr over 60 Minutes Intravenous Every 8 hours 05/06/2020 1239 05/21/20 1055   05/22/2020 1430  fluconazole (DIFLUCAN) tablet 150 mg        150 mg Oral Daily 05/22/2020 1421 05/26/20 2359   05/19/2020 1430  cefTRIAXone (ROCEPHIN) 2 g in sodium chloride 0.9 % 100 mL IVPB  Status:  Discontinued        2 g 200 mL/hr over 30 Minutes Intravenous Every 24 hours 05/30/2020 1421 05/21/20 0815   05/06/2020 1400  azithromycin (ZITHROMAX) 500 mg in sodium chloride 0.9 % 250 mL IVPB        500 mg 250 mL/hr over 60 Minutes Intravenous Every 24 hours 05/30/2020 1337 05/24/20 1515   06/03/2020 1333  remdesivir 200 mg in sodium chloride 0.9% 250 mL IVPB       "Followed by" Linked Group Details   200 mg 580 mL/hr over 30 Minutes Intravenous Once 05/08/2020 1333  05/25/2020 1614   05/19/2020 1245  vancomycin (VANCOREADY) IVPB 1500 mg/300 mL        1,500 mg 150 mL/hr over 120 Minutes Intravenous  Once 05/07/2020 1239 05/18/2020 1812   05/07/2020 1215  ceFEPIme (MAXIPIME) 2 g in sodium chloride 0.9 % 100 mL IVPB        2 g 200 mL/hr over 30 Minutes Intravenous  Once 05/30/2020 1213 05/19/2020 1350     Subjective: Patient noted to be working with PT but very very anxious.  Refuses to get on the recliner.  Panicking.  Does not answer any questions.  Objective: Vitals:   05/28/20 0600 05/28/20 0724 05/28/20 0800 05/28/20 0923  BP: 129/70  119/83   Pulse: 66 65 (!) 59   Resp: (!) 39 (!) 26 (!) 26   Temp:      TempSrc:      SpO2: 90% 95% 95% 94%  Weight:  Height:        Intake/Output Summary (Last 24 hours) at 05/28/2020 1145 Last data filed at 05/28/2020 1055 Gross per 24 hour  Intake 340 ml  Output 200 ml  Net 140 ml   Filed Weights   05/25/20 0429 05/26/20 0453 05/27/20 0438  Weight: 77.5 kg 77.1 kg 76.3 kg    Examination:  General appearance: Awake.  Very very anxious. Resp: Hyperventilating.  Noted to have good air entry bilaterally.  Few crackles at the bases. Cardio: S1-S2 is normal regular.  No S3-S4.  No rubs murmurs or bruit GI: Abdomen is soft.  Nontender nondistended.  Bowel sounds are present normal.  No masses organomegaly Extremities: No edema.  Full range of motion of lower extremities. Neurologic:   No focal neurological deficits.       Data Reviewed: I have personally reviewed following labs and imaging studies  CBC: Recent Labs  Lab 05/22/20 0525 05/23/20 0349 05/24/20 0300 05/25/20 0506 05/28/20 0500  WBC 12.4* 9.8 7.4 8.2 10.7*  NEUTROABS 2.9 1.6* 1.1* 1.0*  --   HGB 11.6* 11.2* 10.8* 11.0* 11.7*  HCT 36.0 34.9* 33.9* 33.6* 37.3  MCV 88.5 87.9 88.1 87.7 89.4  PLT 240 189 153 144* 130*    Basic Metabolic Panel: Recent Labs  Lab 05/22/20 1915 05/23/20 0349 05/24/20 0300 05/25/20 0506 05/26/20 0500  05/27/20 0445 05/28/20 0500  NA  --    < > 142 145 139 144 145  K  --    < > 4.4 4.3 4.5 4.3 4.3  CL  --    < > 111 111 98 112* 110  CO2  --    < > 21* 23 28 20* 24  GLUCOSE  --    < > 154* 170* 87 272* 147*  BUN  --    < > 31* 37* 51* 36* 40*  CREATININE  --    < > 0.62 0.64 1.05* 0.74 0.69  CALCIUM  --    < > 8.4* 8.7* 9.5 8.8* 9.2  MG 2.2  --   --   --   --   --   --    < > = values in this interval not displayed.    GFR: Estimated Creatinine Clearance: 80.7 mL/min (by C-G formula based on SCr of 0.69 mg/dL).  Liver Function Tests: Recent Labs  Lab 05/24/20 0300 05/25/20 0506 05/26/20 0500 05/27/20 0445 05/28/20 0500  AST 22 22 18  38 26  ALT 29 30 23  41 38  ALKPHOS 61 80 86 98 94  BILITOT 0.7 0.9 0.4 0.8 1.0  PROT 5.6* 5.5* 7.3 5.4* 5.8*  ALBUMIN 2.4* 2.5* 3.2* 2.6* 3.0*    CBG: Recent Labs  Lab 05/26/20 2107 05/27/20 0750 05/27/20 1146 05/27/20 1710 05/27/20 2118  GLUCAP 162* 126* 156* 291* 152*     Recent Results (from the past 240 hour(s))  Culture, blood (Routine x 2)     Status: None   Collection Time: 05/08/2020 10:14 AM   Specimen: BLOOD  Result Value Ref Range Status   Specimen Description   Final    BLOOD PORTA CATH Performed at [facility] [address], [place], [place] [address]    Special Requests   Final    BOTTLES DRAWN AEROBIC AND ANAEROBIC Blood Culture adequate volume Performed at [facility] [address] [place],  [address]    Culture   Final    NO GROWTH 5 DAYS Performed at [facility], [place] [address], [place], [place] [address]    Report Status 05/25/2020 FINAL  Final  MRSA PCR Screening     Status: None   Collection Time: 05/21/20  4:32 AM   Specimen: Nasal Mucosa; Nasopharyngeal  Result Value Ref Range Status   MRSA by PCR NEGATIVE NEGATIVE Final    Comment:        The GeneXpert MRSA Assay (FDA approved for NASAL specimens only), is one component of  a comprehensive MRSA colonization surveillance program. It is not intended to diagnose MRSA infection nor to guide or monitor treatment for MRSA infections. Performed at [facility], [place] [address], [place], [place] [address]          Radiology Studies: [facility] Chest Port 1 View  Result Date: 05/28/2020 CLINICAL DATA:  Pneumonia due to COVID 19 virus. EXAM: PORTABLE CHEST 1 VIEW COMPARISON:  One-view chest x-ray 05/22/2020 FINDINGS: Right IJ Port-A-Cath is stable. Heart size is exaggerated by low lung volumes. Increasing interstitial and airspace opacities are present bilaterally. IMPRESSION: Increasing interstitial and airspace opacities bilaterally consistent with worsening multifocal pneumonia. Electronically Signed   By: San Morelle M.D.   On: 05/28/2020 06:23     Scheduled Meds: . baricitinib  4 mg Oral Daily  . Chlorhexidine Gluconate Cloth  6 each Topical Daily  . diclofenac Sodium  2 g Topical QID  . enoxaparin (LOVENOX) injection  75 mg Subcutaneous Q12H  . feeding supplement (ENSURE ENLIVE)  237 mL Oral BID BM  . insulin aspart  0-9 Units Subcutaneous TID WC  . insulin detemir  5 Units Subcutaneous BID  . Ipratropium-Albuterol  1 puff Inhalation QID  . mouth rinse  15 mL Mouth Rinse BID  . methylPREDNISolone (SOLU-MEDROL) injection  45 mg Intravenous Q12H  . metoprolol succinate  150 mg Oral Daily  . multivitamin with minerals  1 tablet Oral Daily  . pantoprazole  40 mg Oral BID  . sacubitril-valsartan  1 tablet Oral BID  . sodium chloride flush  10-40 mL Intracatheter Q12H  . sulfamethoxazole-trimethoprim  1 tablet Oral Daily  . valGANciclovir  900 mg Oral Daily   Continuous Infusions: . sodium chloride       LOS: 8 days     Bonnielee Haff, MD [facility]   To contact the attending provider between 7A-7P or the covering provider during after hours 7P-7A, please log into the web site [email or web address] and access using universal  [place] password for that web site. If you do not have the password, please call the hospital operator.  05/28/2020, 11:45 AM

## 2020-05-29 LAB — GLUCOSE, CAPILLARY
Glucose-Capillary: 145 mg/dL — ABNORMAL HIGH (ref 70–99)
Glucose-Capillary: 190 mg/dL — ABNORMAL HIGH (ref 70–99)
Glucose-Capillary: 182 mg/dL — ABNORMAL HIGH (ref 70–99)
Glucose-Capillary: 111 mg/dL — ABNORMAL HIGH (ref 70–99)

## 2020-05-29 LAB — BASIC METABOLIC PANEL
Anion gap: 13 (ref 5–15)
BUN: 48 mg/dL — ABNORMAL HIGH (ref 6–20)
CO2: 23 mmol/L (ref 22–32)
Calcium: 9.1 mg/dL (ref 8.9–10.3)
Chloride: 103 mmol/L (ref 98–111)
Creatinine, Ser: 0.82 mg/dL (ref 0.44–1.00)
GFR calc Af Amer: >60 mL/min (ref >60)
GFR calc non Af Amer: >60 mL/min (ref >60)
Glucose, Bld: 173 mg/dL — ABNORMAL HIGH (ref 70–99)
Potassium: 4.2 mmol/L (ref 3.5–5.1)
Sodium: 139 mmol/L (ref 135–145)

## 2020-05-29 MED ORDER — METOPROLOL SUCCINATE ER 25 MG PO TB24
50.0000 mg | ORAL_TABLET | Freq: Every day | ORAL | Status: DC
  Administered 2020-05-30 – 2020-06-03 (×5): 50 mg via ORAL
  Filled 2020-05-29 (×5): qty 2

## 2020-05-29 MED ORDER — METHYLPREDNISOLONE SODIUM SUCC 125 MG IJ SOLR
45.0000 mg | Freq: Every day | INTRAMUSCULAR | Status: DC
  Administered 2020-05-30 – 2020-05-31 (×2): 45 mg via INTRAVENOUS
  Filled 2020-05-29: qty 2

## 2020-05-29 MED ORDER — PROSOURCE PLUS PO LIQD
30.0000 mL | Freq: Two times a day (BID) | ORAL | Status: DC
  Administered 2020-05-29 – 2020-06-12 (×24): 30 mL via ORAL
  Filled 2020-05-29 (×23): qty 30

## 2020-05-29 NOTE — Progress Notes (Addendum)
Nutrition Follow-up  RD working remotely.  DOCUMENTATION CODES:   Not applicable  INTERVENTION:  - continue Hormel Shake BID, each supplement provides 500 kcal and 22 grams protein. - will 30 mL Prosource Plus BID, each supplement provides 100 kcal and 15 grams of protein. - will complete NFPE when feasible.  - recommend small bore NGT placement and initiation of TF for patient who continues with very poor PO intakes--MD feels patient would not be open to this intervention at this time.    NUTRITION DIAGNOSIS:   Increased nutrient needs related to acute illness, catabolic illness as evidenced by estimated needs -ongoing  GOAL:   Patient will meet greater than or equal to 90% of their needs -unmet   MONITOR:   PO intake, Supplement acceptance, Labs, Weight trends  ASSESSMENT:   59 y.o. 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 She was discharged from the hospital on 8/7 after treatment for COVID-19. She has not been vacinnated. Patient presented to the ED with SOB, fever, and ongoing productive cough.  Per flow sheet documentation, patient recently consumed the following at meals: 8/20- 0% of breakfast and 0% of lunch 8/21- 50% of breakfast (175 kcal, 3 grams protein) 8/23- 50% of breakfast and 50% of lunch (total of 420 kcal, 14 grams protein) 8/24- 50% of breakfast and 0% of lunch (total of 205 kcal, 3 grams protein)  Weight has been stable over the past week. Non-pitting edema to BLE documented in the flow sheet.   Per notes: - [id number] PNA--vaccinated in July; positive on 8/2 - 30L HFNC with PRN NRB  - persistent poor PO intake - odynophagia--resolved? - CLL   Labs reviewed; CBGs: 145 and 190 mg/dl, BUN: 48 mg/dl. Medications reviewed; sliding scale novolog, 5 units levemir BID, 45 mg solu-medrol BID, 1 tablet multivitamin with minerals/day, 40 mg protonix BID.     Diet Order:   Diet Order            Diet regular Room service appropriate? Yes; Fluid consistency: Thin  Diet effective now                 EDUCATION NEEDS:   No education needs have been identified at this time  Skin:  Skin Assessment: Reviewed RN Assessment  Last BM:  8/22  Height:   Ht Readings from Last 1 Encounters:  05/30/2020 5\' 7"  (1.702 m)    Weight:   Wt Readings from Last 1 Encounters:  05/27/20 76.3 kg     Estimated Nutritional Needs:  Kcal:  1925-2150 kcal Protein:  100-115 grams Fluid:  >/= 2.1 L/day      Jarome Matin, MS, RD, LDN, CNSC Inpatient Clinical Dietitian RD pager # available in AMION  After hours/weekend pager # available in [facility]

## 2020-05-29 NOTE — Plan of Care (Signed)
  Problem: Health Behavior/Discharge Planning: Goal: Ability to manage health-related needs will improve Outcome: Progressing   Problem: Clinical Measurements: Goal: Ability to maintain clinical measurements within normal limits will improve Outcome: Progressing Goal: Will remain free from infection Outcome: Progressing Goal: Diagnostic test results will improve Outcome: Progressing Goal: Respiratory complications will improve Outcome: Progressing Goal: Cardiovascular complication will be avoided Outcome: Progressing   Problem: Activity: Goal: Risk for activity intolerance will decrease Outcome: Progressing   Problem: Nutrition: Goal: Adequate nutrition will be maintained Outcome: Progressing   Problem: Coping: Goal: Level of anxiety will decrease Outcome: Progressing   Problem: Elimination: Goal: Will not experience complications related to bowel motility Outcome: Progressing Goal: Will not experience complications related to urinary retention Outcome: Progressing   Problem: Pain Managment: Goal: General experience of comfort will improve Outcome: Progressing   Problem: Safety: Goal: Ability to remain free from injury will improve Outcome: Progressing   Problem: Skin Integrity: Goal: Risk for impaired skin integrity will decrease Outcome: Progressing   Problem: Coping: Goal: Psychosocial and spiritual needs will be supported Outcome: Progressing   Problem: Respiratory: Goal: Will maintain a patent airway Outcome: Progressing Goal: Complications related to the disease process, condition or treatment will be avoided or minimized Outcome: Progressing

## 2020-05-29 NOTE — Progress Notes (Signed)
°   05/29/20 1100  Clinical Encounter Type  Visited With Patient;Family  Visit Type Initial;Psychological support;Spiritual support;Critical Care  Referral From Nurse  Consult/Referral To Chaplain  Spiritual Encounters  Spiritual Needs Emotional;Other (Comment) (Spiritual Care Conversation/Support)  Stress Factors  Patient Stress Factors Health changes;Major life changes  Family Stress Factors Health changes;Major life changes   I spoke with Audrey Gross, Audrey Gross's husband, to provide spiritual support. Audrey Gross stated that he has had anxiety over his wife's condition and that he fears the worst when he gets calls from the hospital. I was able to ease some of his anxieties.  Audrey Gross stated that he has been happy with the communication between the hospital and himself. He feels supported and in the loop about his wife's healthcare.    I attempted to visit with [name], but she was unavailable at that time. I will follow-up.   Please, contact Spiritual Care for further assistance.   Chaplain Shanon Ace M.Div., [facility]

## 2020-05-29 NOTE — Progress Notes (Addendum)
PROGRESS NOTE    Audrey Gross  OXB:[id number] DOB: 1961-03-29 DOA: 05/22/2020 PCP: Janith Lima, MD   Chief Complaint  Patient presents with  . COVID Positive    Brief Narrative:  Audrey Gross is a 59 y.o. 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 with her last treatment about 2 weeks prior to admission, paroxysmal atrial fibrillation on Eliquis, obesity history of nonischemic cardiomyopathy with an EF of 35% back in 2019 repeated 2D echo that showed an improvement in ejection fraction to 50%, MR, Recently discharged from the hospital on 05/12/2020 and treated for COVID-19 infection she went home off oxygen comes in again for shortness of breath that started about 5 days prior to admission.  She was readmitted with acute hypoxic respiratory failure after recent discharge after treatment for COVID infection.   Assessment & Plan:   Acute respiratory failure with hypoxia secondary to pneumonia due to COVID-19: Apparently vaccinated against COVID-19 in July.  Got only 1 dose though. Tested positive on 8/2 S/p steroids/remdesivir course during last hospital admission. She had to be rehospitalized due to acute respiratory failure with hypoxia.  She was started back on another course of Remdesivir.  She has completed 5 days.  She remains on steroids.  She was also placed on baricitinib.  Due to elevated procalcitonin she was also on antibacterial agents.  She has completed a 5-day course.  Patient's respiratory status seems to be stable.  The main issue is severe anxiety and occasional panic attacks.  Patient was started on Klonopin yesterday with some improvement.  Continue to wean down oxygen.  She is currently on 25 L heated high flow.  She also prefers to have the nonrebreather at her side all the time.  Hold off on Lasix today since she has borderline low blood pressure. Her inflammatory markers have improved.  D-dimer was noted to be elevated. Lower extremity Doppler studies were  negative for DVT.  She was already on Eliquis for history of A. fib.  She was switched over to Lovenox which is being continued. Chest x-ray done yesterday showed worsening opacities but this is compared to x-ray that was done on 8/17.  Clinically she has been improving slowly.  Elevated Troponin: Elevated likely secondary to Covid.  Low suspicion for ACS.  EKG from admission showed a baseline wander and was poor quality.  Troponins are now normal.  Echocardiogram shows normal EF with no wall motion abnormalities.  Do not anticipate any further cardiac work-up at this time.  Normal anion gap metabolic acidosis  Appears to have resolved.  Continue to monitor.  Paroxysmal atrial fibrillation: Continue metoprolol.  Will decrease the dose due to low blood pressures noted this morning.  Patient was switched over from Eliquis to Lovenox due to significantly elevated D-dimer.  Could consider transitioning back to Eliquis in the next few days depending on her clinical progress.  Severe anxiety Patient started on Klonopin with some improvement in anxiety.  Continue for now.  Normocytic anemia Mild drop in hemoglobin is likely dilutional. No evidence of overt bleeding.  Hemoglobin is stable.  Platelet count noted to be low but stable.  Poor PO intake Encourage oral intake.  Mobilize.    Odynophagia Looks like she was being treated empirically with fluconazole for candidal esophagitis.    CLL (chronic lymphocytic leukemia) Chemotherapy on hold. Continue bactrim and valganciclovir  Nonischemic cardiomyopathy  Chronic systolic heart failure  Echocardiogram shows normal systolic function.  Dose of metoprolol to be  decreased due to low blood pressure.  Continue Entresto for now but will need to decrease dose or stop if blood pressure remains low.  Transaminitis: Likely due to COVID-19.  Now normal.    Hypokalemia Repleted.  Hyperglycemia Likely due to steroids.  HbA1c 5.7.  CBGs are  reasonably well controlled.  DVT prophylaxis: Full dose Lovenox Code Status: Full code Family Communication: Husband being updated daily Disposition: Hopefully return home when improved.  PT and OT evaluation.  Status is: Inpatient  Remains inpatient appropriate because:IV treatments appropriate due to intensity of illness or inability to take PO and Inpatient level of care appropriate due to severity of illness   Dispo:  Patient From: Home  Planned Disposition: To be determined  Expected discharge date: 05/30/20  Medically stable for discharge: No      Consultants:   PCCM  Procedures:   none  Antimicrobials:  Anti-infectives (From admission, onward)   Start     Dose/Rate Route Frequency Ordered Stop   05/21/20 1300  sulfamethoxazole-trimethoprim (BACTRIM) 400-80 MG per tablet 1 tablet        1 tablet Oral Daily 05/21/20 1147     05/21/20 1000  remdesivir 100 mg in sodium chloride 0.9 % 100 mL IVPB       "Followed by" Linked Group Details   100 mg 200 mL/hr over 30 Minutes Intravenous Daily 05/25/2020 1333 05/24/20 1039   05/21/20 0900  ceFEPIme (MAXIPIME) 2 g in sodium chloride 0.9 % 100 mL IVPB        2 g 200 mL/hr over 30 Minutes Intravenous Every 8 hours 05/21/20 0832 05/26/20 0922   05/23/2020 2200  valGANciclovir (VALCYTE) 450 MG tablet TABS 900 mg        900 mg Oral Daily 05/19/2020 1421     05/15/2020 2100  vancomycin (VANCOCIN) IVPB 1000 mg/200 mL premix  Status:  Discontinued        1,000 mg 200 mL/hr over 60 Minutes Intravenous Every 8 hours 05/18/2020 1239 05/21/20 1055   05/18/2020 1430  fluconazole (DIFLUCAN) tablet 150 mg        150 mg Oral Daily 05/08/2020 1421 05/26/20 2359   05/22/2020 1430  cefTRIAXone (ROCEPHIN) 2 g in sodium chloride 0.9 % 100 mL IVPB  Status:  Discontinued        2 g 200 mL/hr over 30 Minutes Intravenous Every 24 hours 05/11/2020 1421 05/21/20 0815   05/22/2020 1400  azithromycin (ZITHROMAX) 500 mg in sodium chloride 0.9 % 250 mL IVPB        500  mg 250 mL/hr over 60 Minutes Intravenous Every 24 hours 05/23/2020 1337 05/24/20 1515   05/26/2020 1333  remdesivir 200 mg in sodium chloride 0.9% 250 mL IVPB       "Followed by" Linked Group Details   200 mg 580 mL/hr over 30 Minutes Intravenous Once 05/27/2020 1333 06/03/2020 1614   05/19/2020 1245  vancomycin (VANCOREADY) IVPB 1500 mg/300 mL        1,500 mg 150 mL/hr over 120 Minutes Intravenous  Once 05/17/2020 1239 05/24/2020 1812   05/11/2020 1215  ceFEPIme (MAXIPIME) 2 g in sodium chloride 0.9 % 100 mL IVPB        2 g 200 mL/hr over 30 Minutes Intravenous  Once 05/13/2020 1213 05/12/2020 1350     Subjective: Told her that mobility is necessary for her recovery.  She was told that she should be sitting up in the chair.  She otherwise denies any complaints.  Shortness of  breath seems to be improving.  Objective: Vitals:   05/29/20 1000 05/29/20 1140 05/29/20 1200 05/29/20 1202  BP: (!) 113/47  124/83   Pulse: 96  (!) 34 72  Resp: (!) 42  20 (!) 30  Temp:  (!) 97.5 F (36.4 C)    TempSrc:  Axillary    SpO2: 97%  (!) 85% 95%  Weight:      Height:        Intake/Output Summary (Last 24 hours) at 05/29/2020 1308 Last data filed at 05/29/2020 1200 Gross per 24 hour  Intake 690 ml  Output 1375 ml  Net -685 ml   Filed Weights   05/25/20 0429 05/26/20 0453 05/27/20 0438  Weight: 77.5 kg 77.1 kg 76.3 kg    Examination:  General appearance: Awake alert.  Noted to be anxious.  In no distress. Resp: Noted to be hyperventilating at times.  Improved air entry bilaterally.  Few crackles at the bases. Cardio: S1-S2 is normal regular.  No S3-S4.  No rubs murmurs or bruit GI: Abdomen is soft.  Nontender nondistended.  Bowel sounds are present normal.  No masses organomegaly Extremities: No edema.  Full range of motion of lower extremities. Neurologic:  No focal neurological deficits.      Data Reviewed: I have personally reviewed following labs and imaging studies  CBC: Recent Labs  Lab  05/23/20 0349 05/24/20 0300 05/25/20 0506 05/28/20 0500  WBC 9.8 7.4 8.2 10.7*  NEUTROABS 1.6* 1.1* 1.0*  --   HGB 11.2* 10.8* 11.0* 11.7*  HCT 34.9* 33.9* 33.6* 37.3  MCV 87.9 88.1 87.7 89.4  PLT 189 153 144* 130*    Basic Metabolic Panel: Recent Labs  Lab 05/22/20 1915 05/23/20 0349 05/25/20 0506 05/26/20 0500 05/27/20 0445 05/28/20 0500 05/29/20 0540  NA  --    < > 145 139 144 145 139  K  --    < > 4.3 4.5 4.3 4.3 4.2  CL  --    < > 111 98 112* 110 103  CO2  --    < > 23 28 20* 24 23  GLUCOSE  --    < > 170* 87 272* 147* 173*  BUN  --    < > 37* 51* 36* 40* 48*  CREATININE  --    < > 0.64 1.05* 0.74 0.69 0.82  CALCIUM  --    < > 8.7* 9.5 8.8* 9.2 9.1  MG 2.2  --   --   --   --   --   --    < > = values in this interval not displayed.    GFR: Estimated Creatinine Clearance: 78.7 mL/min (by C-G formula based on SCr of 0.82 mg/dL).  Liver Function Tests: Recent Labs  Lab 05/24/20 0300 05/25/20 0506 05/26/20 0500 05/27/20 0445 05/28/20 0500  AST 22 22 18  38 26  ALT 29 30 23  41 38  ALKPHOS 61 80 86 98 94  BILITOT 0.7 0.9 0.4 0.8 1.0  PROT 5.6* 5.5* 7.3 5.4* 5.8*  ALBUMIN 2.4* 2.5* 3.2* 2.6* 3.0*    CBG: Recent Labs  Lab 05/28/20 1202 05/28/20 1647 05/28/20 2124 05/29/20 0854 05/29/20 1129  GLUCAP 79 166* 172* 145* 190*     Recent Results (from the past 240 hour(s))  Culture, blood (Routine x 2)     Status: None   Collection Time: 05/08/2020 10:14 AM   Specimen: BLOOD  Result Value Ref Range Status   Specimen Description   Final  BLOOD PORTA CATH Performed at [facility], [place] [address], [place], [place] [address]    Special Requests   Final    BOTTLES DRAWN AEROBIC AND ANAEROBIC Blood Culture adequate volume Performed at [facility] [address] [place], [place] [address]    Culture   Final    NO GROWTH 5 DAYS Performed at [facility], [place] [address] [place], [place] [address]     Report Status 05/25/2020 FINAL  Final  MRSA PCR Screening     Status: None   Collection Time: 05/21/20  4:32 AM   Specimen: Nasal Mucosa; Nasopharyngeal  Result Value Ref Range Status   MRSA by PCR NEGATIVE NEGATIVE Final    Comment:        The GeneXpert MRSA Assay (FDA approved for NASAL specimens only), is one component of a comprehensive MRSA colonization surveillance program. It is not intended to diagnose MRSA infection nor to guide or monitor treatment for MRSA infections. Performed at [facility], [place] [address] [place], [place] [address]          Radiology Studies: [facility] Chest Port 1 View  Result Date: 05/28/2020 CLINICAL DATA:  Pneumonia due to COVID 19 virus. EXAM: PORTABLE CHEST 1 VIEW COMPARISON:  One-view chest x-ray 05/22/2020 FINDINGS: Right IJ Port-A-Cath is stable. Heart size is exaggerated by low lung volumes. Increasing interstitial and airspace opacities are present bilaterally. IMPRESSION: Increasing interstitial and airspace opacities bilaterally consistent with worsening multifocal pneumonia. Electronically Signed   By: San Morelle M.D.   On: 05/28/2020 06:23     Scheduled Meds: . baricitinib  4 mg Oral Daily  . Chlorhexidine Gluconate Cloth  6 each Topical Daily  . clonazePAM  0.5 mg Oral BID  . diclofenac Sodium  2 g Topical QID  . enoxaparin (LOVENOX) injection  75 mg Subcutaneous Q12H  . feeding supplement (ENSURE ENLIVE)  237 mL Oral BID BM  . insulin aspart  0-9 Units Subcutaneous TID WC  . insulin detemir  5 Units Subcutaneous BID  . Ipratropium-Albuterol  1 puff Inhalation QID  . mouth rinse  15 mL Mouth Rinse BID  . methylPREDNISolone (SOLU-MEDROL) injection  45 mg Intravenous Q12H  . metoprolol succinate  150 mg Oral Daily  . multivitamin with minerals  1 tablet Oral Daily  . pantoprazole  40 mg Oral BID  . sacubitril-valsartan  1 tablet Oral BID  . sodium chloride flush  10-40 mL Intracatheter Q12H  .  sulfamethoxazole-trimethoprim  1 tablet Oral Daily  . valGANciclovir  900 mg Oral Daily   Continuous Infusions: . sodium chloride       LOS: 9 days     Bonnielee Haff, MD [facility]   To contact the attending provider between 7A-7P or the covering provider during after hours 7P-7A, please log into the web site [email or web address] and access using universal Octa password for that web site. If you do not have the password, please call the hospital operator.  05/29/2020, 1:08 PM

## 2020-05-30 LAB — BASIC METABOLIC PANEL
Anion gap: 14 (ref 5–15)
BUN: 61 mg/dL — ABNORMAL HIGH (ref 6–20)
CO2: 22 mmol/L (ref 22–32)
Calcium: 9.1 mg/dL (ref 8.9–10.3)
Chloride: 103 mmol/L (ref 98–111)
Creatinine, Ser: 0.96 mg/dL (ref 0.44–1.00)
GFR calc Af Amer: >60 mL/min (ref >60)
GFR calc non Af Amer: >60 mL/min (ref >60)
Glucose, Bld: 105 mg/dL — ABNORMAL HIGH (ref 70–99)
Potassium: 4.3 mmol/L (ref 3.5–5.1)
Sodium: 139 mmol/L (ref 135–145)

## 2020-05-30 LAB — GLUCOSE, CAPILLARY
Glucose-Capillary: 114 mg/dL — ABNORMAL HIGH (ref 70–99)
Glucose-Capillary: 196 mg/dL — ABNORMAL HIGH (ref 70–99)
Glucose-Capillary: 132 mg/dL — ABNORMAL HIGH (ref 70–99)
Glucose-Capillary: 119 mg/dL — ABNORMAL HIGH (ref 70–99)

## 2020-05-30 NOTE — Progress Notes (Signed)
Occupational Therapy Progress Note   Patient with limited interaction with therapy, requiring max encouragement to open eyes/increase alertness. Total A x2 for supine to sit, patient becomes anxious saying "stop" and "help" provide max encouragement and reassurance, try to have patient elaborate what is wrong but does not respond. Patient require increased time to calm down with initial mod to max A assist for sitting balance due to posterior lean. Patient progress to close supervision- Min G however keeps head in flexed posture and eyes closed with very minimal interaction with therapists. Patient sat EOB ~10 minutes before initiating laying back onto bed with mod A to lift LEs onto bed. Patient require max encouragement to perform AAROM B UE, attempt to have patient drink from cup/engage with therapists to increase alertness with mild improvement in keeping eyes open at end of session.    05/30/20 1400  OT Visit Information  Last OT Received On 05/30/20  Assistance Needed +2  PT/OT/SLP Co-Evaluation/Treatment Yes  Reason for Co-Treatment Complexity of the patient's impairments (multi-system involvement);Necessary to address cognition/behavior during functional activity;For patient/therapist safety;To address functional/ADL transfers  OT goals addressed during session ADL's and self-care  History of Present Illness Audrey Gross is a 59 y.o. 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 with her last treatment about 2 weeks prior to admission, paroxysmal atrial fibrillation on Eliquis, obesity history of nonischemic cardiomyopathy with an EF of 35% back in 2019 repeated 2D echo that showed an improvement in ejection fraction to 50%, MR, Recently discharged from the hospital on 05/12/2020 and treated for COVID-19 infection, home off oxygen comes in 06/01/2020 with respiratory distress.  Precautions  Precautions Fall  Precaution Comments anxious with mobility  Pain Assessment  Pain Assessment Faces   Faces Pain Scale 0  Cognition  Arousal/Alertness Lethargic;Suspect due to medications  Behavior During Therapy Anxious;Restless  Overall Cognitive Status No family/caregiver present to determine baseline cognitive functioning  General Comments patient medicated for anxiety prior to treatment, requires max cues to open her eyes alertness improved after mobility and return to semi-supine. patient minimally verbalizing with therapy, did state shes in [place] [facility] however when trying to ask about family, who Louie Casa is patient states "I don't know" or shakes her head no. With sitting up at EOB patient initially very anxious yelling "stop" and "help" patient require ~3-4 minutes to calm however still with head in flexed posture and eyes closed   Upper Extremity Assessment  Upper Extremity Assessment Generalized weakness  ADL  Overall ADL's  Needs assistance/impaired  Eating/Feeding Details (indicate cue type and reason) multiple attempts made to have patient drink water, would hold onto cup for few seconds then let go with no initiation to bring hand to mouth   Bed Mobility  Overal bed mobility Needs Assistance  Bed Mobility Supine to Sit;Sit to Supine  Supine to sit Total assist;+2 for physical assistance;+2 for safety/equipment  Sit to supine Mod assist  General bed mobility comments max encouragement however patient does not initiate sitting up requiring total A x2 for trunk and LE management to EOB. patient progress to close supervision in sitting. initiates laying herself back onto bed requiring mod A for LE management to supine  Balance  Overall balance assessment Needs assistance  Sitting-balance support Feet unsupported  Sitting balance-Leahy Scale Poor  Sitting balance - Comments initially requiring moderate assistance due to posterior lean, high anxiety did progress to very close supervision - Min G for sitting balance  Restrictions  Weight  Bearing Restrictions No   Transfers  General transfer comment deferred  General Comments  General comments (skin integrity, edema, etc.) lowest O2 sat observed low 80s on 80 FIO2 35L O2 on partial non rebreather (used intermittently by patient for recovery) and HHFNC, RR ranging from low 20s to 44. when prompted patient to slow down her breathing she shakes her head no   Exercises  Exercises General Upper Extremity  General Exercises - Upper Extremity  Shoulder Flexion AAROM;Both;5 reps (semi-supine)  OT - End of Session  Equipment Utilized During Treatment Oxygen  Activity Tolerance Patient limited by lethargy (anxiety)  Patient left in bed;with call bell/phone within reach;with bed alarm set  Nurse Communication Mobility status  OT Assessment/Plan  OT Plan Discharge plan needs to be updated  OT Visit Diagnosis Other abnormalities of gait and mobility (R26.89);Muscle weakness (generalized) (M62.81)  OT Frequency (ACUTE ONLY) Min 2X/week  Follow Up Recommendations SNF;Supervision/Assistance - 24 hour  OT Equipment Other (comment) (defer to next venue)  AM-PAC OT "6 Clicks" Daily Activity Outcome Measure (Version 2)  Help from another person eating meals? 2  Help from another person taking care of personal grooming? 1  Help from another person toileting, which includes using toliet, bedpan, or urinal? 1  Help from another person bathing (including washing, rinsing, drying)? 1  Help from another person to put on and taking off regular upper body clothing? 1  Help from another person to put on and taking off regular lower body clothing? 1  6 Click Score 7  OT Goal Progression  Progress towards OT goals Not progressing toward goals - comment;Goals drowngraded-see care plan (decreased strength, cognition)  Acute Rehab OT Goals  Patient Stated Goal none stated  OT Goal Formulation Patient unable to participate in goal setting (very anxiou)  Time For Goal Achievement 06/11/20  Potential to Achieve Goals Good   ADL Goals  Pt Will Perform Grooming sitting;with supervision  Pt Will Perform Upper Body Bathing sitting;with supervision  Pt Will Transfer to Toilet bedside commode;ambulating;with min assist  Pt Will Perform Toileting - Clothing Manipulation and hygiene sit to/from stand;sitting/lateral leans;with min assist  Pt/caregiver will Perform Home Exercise Program Increased strength;Both right and left upper extremity;With theraband;With written HEP provided;Independently  Additional ADL Goal #1 Patient will verbalize 3 energy conservation strategies to implement during self care tasks in order to maximize safety and independence with daily routine.  OT Time Calculation  OT Start Time (ACUTE ONLY) 1345  OT Stop Time (ACUTE ONLY) 1415  OT Time Calculation (min) 30 min  OT General Charges  $OT Visit 1 Visit  OT Treatments  $Therapeutic Activity 8-22 mins   Delbert Phenix OT OT pager: [phone]

## 2020-05-30 NOTE — Progress Notes (Signed)
PROGRESS NOTE  Audrey Gross  DOB: Oct 01, 1961  PCP: Janith Lima, MD KNL:[id number]  DOA: 05/21/2020  LOS: 10 days   Chief Complaint  Patient presents with  . COVID Positive   Brief narrative: Audrey Gross is a 59 y.o. female with PMH of CLL on chemotherapy with her last treatment about 2 weeks prior to admission, paroxysmal atrial fibrillation on Eliquis, obesity, history of nonischemic cardiomyopathy. Patient was recently admitted 8/2-8/7 for Covid pneumonia.  She did not require oxygen at discharge.  Patient presented to the ED again on 05/12/2020 with progressively worsening shortness of breath for 5 days.  In the ED, she was hypoxic and required 15 L via nonrebreather to maintain oxygen saturation over 90%. Chest x-ray showed progression of bilateral lower lungs opacities. She was admitted for worsening respiratory failure.  Subjective: Patient was seen and examined this morning.  Middle-aged African-American female.  Partially awake.  Not in distress. Remains on 30 L/min of high flow oxygen by nasal cannula.  Assessment/Plan: Progression of COVID pneumonia Acute respiratory failure with hypoxia  -Presented with worsening shortness of breath -recently hospitalized and treated 8/2-8/7 for Covid pneumonia with IV steroids, IV remdesivir. -Chest imaging: On admission showed worsening of bilateral infiltrates. -She was given a repeat 5-day course of IV remdesivir, completed on 8/20.  Also remains on baricitinib since 8/17.  He completed 5-day course of antibiotics because of elevated procalcitonin. -She remains on Solu-Medrol IV 45 mg daily. -Supportive care: Vitamin C, Zinc, inhalers, Tylenol, Antitussives (benzonatate/ Mucinex/Tussionex)  -Protonix while on steroids. -Despite aggressive treatment, patient still remains on heated high flow oxygen, currently at 35 L/min.  Last chest x-ray from 8/23 showed increasing interstitial and airspace opacities consistent with worsening  multifocal pneumonia. -IV Lasix given intermittently, limited by blood pressure. -Oxygen - SpO2: 95 % O2 Flow Rate (L/min): 35 L/min FiO2 (%): 80 % -Continue airborne/contact isolation precautions. -WBC, lactic acid, procalcitonin and inflammatory markers trend as below.  Recent Labs  Lab 05/24/20 0300 05/25/20 0506 05/26/20 0500 05/27/20 0445 05/28/20 0500  WBC 7.4 8.2  --   --  10.7*  PROCALCITON 0.19  --   --   --   --   DDIMER 17.13* 8.73* 3.28* 2.84* 1.88*  CRP 4.5* 1.6* 2.8* 1.6* 2.1*  ALT 29 30 23  41 38   The treatment plan and use of medications and known side effects were discussed with patient/family. Some of the medications used are based on case reports/anecdotal data.  All other medications being used in the management of COVID-19 based on limited study data.  Complete risks and long-term side effects are unknown, however in the best clinical judgment they seem to be of some benefit.  Patient wanted to proceed with treatment options provided.  Paroxysmal atrial fibrillation: -Continue metoprolol and subcu Lovenox.  If clinically improves, will switch back to oral Eliquis.  Elevated Troponin: Elevated likely secondary to Covid.  Low suspicion for ACS.  EKG from admission showed a baseline wander and was poor quality.  Troponins are now normal.  Echocardiogram shows normal EF with no wall motion abnormalities.  Do not anticipate any further cardiac work-up at this time.  Normal anion gap metabolic acidosis  Appears to have resolved.  Continue to monitor.  Severe anxiety Patient started on Klonopin with some improvement in anxiety.  Continue for now.  Odynophagia Looks like she was being treated empirically with fluconazole for candidal esophagitis.    CLL (chronic lymphocytic leukemia) Chemotherapy on hold. Continue  bactrim and valganciclovir.  Nonischemic cardiomyopathy Chronic systolic heart failure  -Chart review showed EF of 35% back in 2019.  Repeat  echo showed improvement in EF to 50%.  -Continue metoprolol, Entresto. -Continue to monitor blood pressure, renal function.   Hyperglycemia Likely due to steroids.  HbA1c 5.7.  CBGs are reasonably well controlled.  Mobility: Needs PT evaluation once oxygenation improves Code Status:   Code Status: Full Code  Nutritional status: Body mass index is 26.35 kg/m. Nutrition Problem: Increased nutrient needs Etiology: acute illness, catabolic illness Signs/Symptoms: estimated needs Diet Order            Diet regular Room service appropriate? Yes; Fluid consistency: Thin  Diet effective now                 DVT prophylaxis: Lovenox subcu   Antimicrobials:  On Bactrim and valganciclovir per oncology Fluid: None  Consultants: None Family Communication:  None at bedside  Status is: Inpatient  Remains inpatient appropriate because:Ongoing diagnostic testing needed not appropriate for outpatient work up and IV treatments appropriate due to intensity of illness or inability to take PO   Dispo:  Patient From: Home  Planned Disposition: To be determined  Expected discharge date: 06/01/20  Medically stable for discharge: No   Infusions:  . sodium chloride      Scheduled Meds: . (feeding supplement) PROSource Plus  30 mL Oral BID BM  . baricitinib  4 mg Oral Daily  . Chlorhexidine Gluconate Cloth  6 each Topical Daily  . clonazePAM  0.5 mg Oral BID  . diclofenac Sodium  2 g Topical QID  . enoxaparin (LOVENOX) injection  75 mg Subcutaneous Q12H  . feeding supplement (ENSURE ENLIVE)  237 mL Oral BID BM  . insulin aspart  0-9 Units Subcutaneous TID WC  . insulin detemir  5 Units Subcutaneous BID  . Ipratropium-Albuterol  1 puff Inhalation QID  . mouth rinse  15 mL Mouth Rinse BID  . methylPREDNISolone (SOLU-MEDROL) injection  45 mg Intravenous Daily  . metoprolol succinate  50 mg Oral Daily  . multivitamin with minerals  1 tablet Oral Daily  . pantoprazole  40 mg Oral BID    . sacubitril-valsartan  1 tablet Oral BID  . sodium chloride flush  10-40 mL Intracatheter Q12H  . sulfamethoxazole-trimethoprim  1 tablet Oral Daily  . valGANciclovir  900 mg Oral Daily    Antimicrobials: Anti-infectives (From admission, onward)   Start     Dose/Rate Route Frequency Ordered Stop   05/21/20 1300  sulfamethoxazole-trimethoprim (BACTRIM) 400-80 MG per tablet 1 tablet        1 tablet Oral Daily 05/21/20 1147     05/21/20 1000  remdesivir 100 mg in sodium chloride 0.9 % 100 mL IVPB       "Followed by" Linked Group Details   100 mg 200 mL/hr over 30 Minutes Intravenous Daily 05/27/2020 1333 05/24/20 1039   05/21/20 0900  ceFEPIme (MAXIPIME) 2 g in sodium chloride 0.9 % 100 mL IVPB        2 g 200 mL/hr over 30 Minutes Intravenous Every 8 hours 05/21/20 0832 05/26/20 0922   05/21/2020 2200  valGANciclovir (VALCYTE) 450 MG tablet TABS 900 mg        900 mg Oral Daily 05/26/2020 1421     05/10/2020 2100  vancomycin (VANCOCIN) IVPB 1000 mg/200 mL premix  Status:  Discontinued        1,000 mg 200 mL/hr over 60 Minutes Intravenous Every 8  hours 05/22/2020 1239 05/21/20 1055   05/30/2020 1430  fluconazole (DIFLUCAN) tablet 150 mg        150 mg Oral Daily 05/06/2020 1421 05/26/20 2359   05/25/2020 1430  cefTRIAXone (ROCEPHIN) 2 g in sodium chloride 0.9 % 100 mL IVPB  Status:  Discontinued        2 g 200 mL/hr over 30 Minutes Intravenous Every 24 hours 05/14/2020 1421 05/21/20 0815   05/28/2020 1400  azithromycin (ZITHROMAX) 500 mg in sodium chloride 0.9 % 250 mL IVPB        500 mg 250 mL/hr over 60 Minutes Intravenous Every 24 hours 05/25/2020 1337 05/24/20 1515   05/07/2020 1333  remdesivir 200 mg in sodium chloride 0.9% 250 mL IVPB       "Followed by" Linked Group Details   200 mg 580 mL/hr over 30 Minutes Intravenous Once 05/15/2020 1333 05/08/2020 1614   06/05/2020 1245  vancomycin (VANCOREADY) IVPB 1500 mg/300 mL        1,500 mg 150 mL/hr over 120 Minutes Intravenous  Once 05/14/2020 1239 06/02/2020 1812    05/25/2020 1215  ceFEPIme (MAXIPIME) 2 g in sodium chloride 0.9 % 100 mL IVPB        2 g 200 mL/hr over 30 Minutes Intravenous  Once 05/19/2020 1213 05/11/2020 1350      PRN meds: sodium chloride, acetaminophen, albuterol, ALPRAZolam, guaiFENesin-dextromethorphan, hydrALAZINE, lip balm, morphine injection, ondansetron **OR** ondansetron (ZOFRAN) IV, oxyCODONE, polyethylene glycol, sodium chloride, sodium chloride flush   Objective: Vitals:   05/30/20 1516 05/30/20 1543  BP:    Pulse:  78  Resp:  (!) 31  Temp: 97.7 F (36.5 C)   SpO2:  95%    Intake/Output Summary (Last 24 hours) at 05/30/2020 1736 Last data filed at 05/30/2020 1450 Gross per 24 hour  Intake 560 ml  Output 600 ml  Net -40 ml   Filed Weights   05/25/20 0429 05/26/20 0453 05/27/20 0438  Weight: 77.5 kg 77.1 kg 76.3 kg   Weight change:  Body mass index is 26.35 kg/m.   Physical Exam: General exam: Appears calm and comfortable.  Not in physical distress Skin: No rashes, lesions or ulcers. HEENT: Atraumatic, normocephalic, supple neck, no obvious bleeding Lungs: Clear to auscultation bilaterally CVS: Regular rate and rhythm, no murmur GI/Abd soft, nontender, nondistended, bowel sound present CNS: Half awake.  Able to follow commands, falls back asleep quick Psychiatry: Depressed look Extremities: No pedal edema, no calf tenderness  Data Review: I have personally reviewed the laboratory data and studies available.  Recent Labs  Lab 05/24/20 0300 05/25/20 0506 05/28/20 0500  WBC 7.4 8.2 10.7*  NEUTROABS 1.1* 1.0*  --   HGB 10.8* 11.0* 11.7*  HCT 33.9* 33.6* 37.3  MCV 88.1 87.7 89.4  PLT 153 144* 130*   Recent Labs  Lab 05/26/20 0500 05/27/20 0445 05/28/20 0500 05/29/20 0540 05/30/20 0600  NA 139 144 145 139 139  K 4.5 4.3 4.3 4.2 4.3  CL 98 112* 110 103 103  CO2 28 20* 24 23 22   GLUCOSE 87 272* 147* 173* 105*  BUN 51* 36* 40* 48* 61*  CREATININE 1.05* 0.74 0.69 0.82 0.96  CALCIUM 9.5 8.8*  9.2 9.1 9.1   Lab Results  Component Value Date   HGBA1C 5.7 (H) 05/11/2020       Component Value Date/Time   CHOL 226 (H) 08/31/2019 0000   TRIG 139 05/14/2020 1110   HDL 65 08/31/2019 0000   CHOLHDL 5.5 07/16/2017 0103  VLDL 20 07/16/2017 0103   LDLCALC 140 (H) 08/31/2019 0000   LDLDIRECT 144.5 07/26/2013 0934   LABVLDL 21 08/31/2019 0000   Signed, Terrilee Croak, MD [facility] Pager: [phone] (Secure Chat preferred). 05/30/2020

## 2020-05-31 LAB — BASIC METABOLIC PANEL
Anion gap: 14 (ref 5–15)
BUN: 72 mg/dL — ABNORMAL HIGH (ref 6–20)
CO2: 22 mmol/L (ref 22–32)
Calcium: 9 mg/dL (ref 8.9–10.3)
Chloride: 104 mmol/L (ref 98–111)
Creatinine, Ser: 1.16 mg/dL — ABNORMAL HIGH (ref 0.44–1.00)
GFR calc Af Amer: 60 mL/min — ABNORMAL LOW (ref >60)
GFR calc non Af Amer: 51 mL/min — ABNORMAL LOW (ref >60)
Glucose, Bld: 139 mg/dL — ABNORMAL HIGH (ref 70–99)
Potassium: 4.3 mmol/L (ref 3.5–5.1)
Sodium: 140 mmol/L (ref 135–145)

## 2020-05-31 LAB — CBC
HCT: 36.2 % (ref 36.0–46.0)
Hemoglobin: 11.4 g/dL — ABNORMAL LOW (ref 12.0–15.0)
MCH: 28.2 pg (ref 26.0–34.0)
MCHC: 31.5 g/dL (ref 30.0–36.0)
MCV: 89.6 fL (ref 80.0–100.0)
Platelets: 141 10*3/uL — ABNORMAL LOW (ref 150–400)
RBC: 4.04 MIL/uL (ref 3.87–5.11)
RDW: 14.9 % (ref 11.5–15.5)
WBC: 10.9 10*3/uL — ABNORMAL HIGH (ref 4.0–10.5)
nRBC: 0 % (ref 0.0–0.2)

## 2020-05-31 LAB — GLUCOSE, CAPILLARY
Glucose-Capillary: 95 mg/dL (ref 70–99)
Glucose-Capillary: 209 mg/dL — ABNORMAL HIGH (ref 70–99)
Glucose-Capillary: 168 mg/dL — ABNORMAL HIGH (ref 70–99)
Glucose-Capillary: 131 mg/dL — ABNORMAL HIGH (ref 70–99)

## 2020-05-31 LAB — C-REACTIVE PROTEIN: CRP: 0.8 mg/dL (ref <1.0)

## 2020-05-31 MED ORDER — METHYLPREDNISOLONE SODIUM SUCC 40 MG IJ SOLR
40.0000 mg | Freq: Two times a day (BID) | INTRAMUSCULAR | Status: DC
  Administered 2020-05-31 – 2020-06-13 (×26): 40 mg via INTRAVENOUS
  Filled 2020-05-31 (×26): qty 1

## 2020-05-31 NOTE — Progress Notes (Signed)
[place] for Lovenox Indication: atrial fibrillation  No Known Allergies  Patient Measurements: Height: 5\' 7"  (170.2 cm) Weight: 76.3 kg (168 lb 3.4 oz) IBW/kg (Calculated) : 61.6  Vital Signs: Temp: 98.3 F (36.8 C) (08/26 0800) Temp Source: Oral (08/26 0800) BP: 105/64 (08/26 0800) Pulse Rate: 76 (08/26 0800)  Labs: Recent Labs    05/29/20 0540 05/30/20 0600 05/31/20 0500  HGB  --   --  11.4*  HCT  --   --  36.2  PLT  --   --  141*  CREATININE 0.82 0.96 1.16*    Estimated Creatinine Clearance: 55.6 mL/min (A) (by C-G formula based on SCr of 1.16 mg/dL (H)).   Medical History: Past Medical History:  Diagnosis Date  . Atrial fibrillation ([place])   . Cystic breast   . Depression   . Diffuse cystic mastopathy   . Dyslipidemia (high LDL; low HDL)   . Hypertension   . Iron deficiency anemia, unspecified   . Lymphadenopathy of head and neck 04/05/2015  . Medical non-compliance   . Mitral regurgitation 02/2017   moderate to severe  . Persistent atrial fibrillation with rapid ventricular response ([place]) 07/15/2017    Medications:  Scheduled:  . (feeding supplement) PROSource Plus  30 mL Oral BID BM  . baricitinib  4 mg Oral Daily  . Chlorhexidine Gluconate Cloth  6 each Topical Daily  . clonazePAM  0.5 mg Oral BID  . diclofenac Sodium  2 g Topical QID  . enoxaparin (LOVENOX) injection  75 mg Subcutaneous Q12H  . feeding supplement (ENSURE ENLIVE)  237 mL Oral BID BM  . insulin aspart  0-9 Units Subcutaneous TID WC  . insulin detemir  5 Units Subcutaneous BID  . Ipratropium-Albuterol  1 puff Inhalation QID  . mouth rinse  15 mL Mouth Rinse BID  . methylPREDNISolone (SOLU-MEDROL) injection  45 mg Intravenous Daily  . metoprolol succinate  50 mg Oral Daily  . multivitamin with minerals  1 tablet Oral Daily  . pantoprazole  40 mg Oral BID  . sacubitril-valsartan  1 tablet Oral BID  . sodium chloride flush  10-40 mL  Intracatheter Q12H  . sulfamethoxazole-trimethoprim  1 tablet Oral Daily  . valGANciclovir  900 mg Oral Daily   Infusions:  . sodium chloride      Assessment: 23 yoF re-admitted on 8/15 with COVID-19 pneumonia.  PMH includes Afib on Eliquis prior to admission.  Pharmacy is now consulted to dose Lovenox. 8/16 LE Dopplers: negative for DVT  05/31/2020 SCr 1.16 slightly increased, CrCl 55 ml/min CBC: Hgb 11.4, Plt 141 D-dimer down to 1.88 (8/23 No bleeding noted  Goal of Therapy:  Anti-Xa level 0.6-1 units/ml 4hrs after LMWH dose given Monitor platelets by anticoagulation protocol: Yes   Plan:  Continue Lovenox 1 mg/kg [place] q12h  Monitor renal function, CBC, s/s bleeding/thrombosis, transition back to eliquis  Peggyann Juba, PharmD, BCPS Pharmacy: [phone] 05/31/2020, 9:44 AM

## 2020-05-31 NOTE — TOC Progression Note (Signed)
Transition of Care [facility]) - Progression Note    Patient Details  Name: Audrey Gross MRN: [id number] Date of Birth: Feb 07, 1961  [facility]) CM/SW Contact  Leeroy Cha, RN Phone Number: 05/31/2020, 10:08 AM  Clinical Narrative:    Remains on hfnrb mask at 30l/min, iv sol,u medrol, crp-0.8, bun 72 creat 1.16 wbc 10.9 Following for progression and toc needs may be snf placement depending on progress.   Expected Discharge Plan: Home/Self Care Barriers to Discharge: Continued Medical Work up  Expected Discharge Plan and Services Expected Discharge Plan: Home/Self Care   Discharge Planning Services: CM Consult   Living arrangements for the past 2 months: Single Family Home                                       Social Determinants of Health (SDOH) Interventions    Readmission Risk Interventions No flowsheet data found.

## 2020-05-31 NOTE — Progress Notes (Signed)
O2 saturation in the low 80s on monitor. Upon entering pt room, pt noted to not have her nasal canula on. Reapplied and educated on importance of wearing it continuously. Saturation at 94% prior to leaving bedside.

## 2020-05-31 NOTE — Progress Notes (Signed)
Physical Therapy Treatment- Late entry for 05/30/20  Patient Details Name: Audrey Gross MRN: [id number] DOB: Jul 24, 1961 Today's Date: 05/31/2020    History of Present Illness Audrey Gross is a 59 y.o. 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 with her last treatment about 2 weeks prior to admission, paroxysmal atrial fibrillation on Eliquis, obesity history of nonischemic cardiomyopathy with an EF of 35% back in 2019 repeated 2D echo that showed an improvement in ejection fraction to 50%, MR, Recently discharged from the hospital on 05/12/2020 and treated for COVID-19 infection, home off oxygen comes in 05/23/2020 with respiratory distress.    PT Comments    The patient presents with decreased interaction, increased anxiousness with any mobility, states:I can't breathe." Patient frequently repositions NRB. SPO2 resting 93 on 35L and Fion 70% and intermittent NRB. Patietn keeps eyes closed.  Encouraged patient to open her phone to show pictures of family, patient did not perform, would take phone in hand though. States she does not know who Louie Casa is(her spouse). Patient is  Limited in progressive mobility. Continue as patient tolerates.   Follow Up Recommendations  SNF;Home health PT (depends on progress)     Equipment Recommendations  None recommended by PT    Recommendations for Other Services       Precautions / Restrictions Precautions Precautions: Fall Precaution Comments: anxious with mobility    Mobility  Bed Mobility Overal bed mobility: Needs Assistance Bed Mobility: Supine to Sit;Sit to Supine     Supine to sit: Total assist;+2 for physical assistance;+2 for safety/equipment Sit to supine: Mod assist   General bed mobility comments: max encouragement however patient does not initiate sitting up requiring total A x2 for trunk and LE management to EOB. patient progress to close supervision in sitting. initiates laying herself back onto bed requiring mod A for LE  management to supine  Transfers                 General transfer comment: deferred  Ambulation/Gait                 Stairs             Wheelchair Mobility    Modified Rankin (Stroke Patients Only)       Balance Overall balance assessment: Needs assistance Sitting-balance support: Feet unsupported Sitting balance-Leahy Scale: Poor Sitting balance - Comments: initially requiring moderate assistance due to posterior lean, high anxiety did progress to very close supervision - Min G for sitting balance                                    Cognition Arousal/Alertness: Lethargic;Suspect due to medications Behavior During Therapy: Anxious;Restless Overall Cognitive Status: No family/caregiver present to determine baseline cognitive functioning                                 General Comments: patient medicated for anxiety prior to treatment, requires max cues to open her eyes alertness improved after mobility and return to semi-supine. patient minimally verbalizing with therapy, did state shes in [place] and [facility] however when trying to ask about family, who Louie Casa is patient states "I don't know" or shakes her head no. With sitting up at EOB patient initially very anxious yelling "stop" and "help" patient require ~3-4 minutes to calm however still with head in  flexed posture and eyes closed       Exercises      General Comments        Pertinent Vitals/Pain Faces Pain Scale: No hurt    Home Living                      Prior Function            PT Goals (current goals can now be found in the care plan section) Progress towards PT goals: Progressing toward goals    Frequency    Min 3X/week      PT Plan Discharge plan needs to be updated    Co-evaluation PT/OT/SLP Co-Evaluation/Treatment: Yes Reason for Co-Treatment: Complexity of the patient's impairments (multi-system involvement);For  patient/therapist safety PT goals addressed during session: Mobility/safety with mobility OT goals addressed during session: ADL's and self-care      AM-PAC PT "6 Clicks" Mobility   Outcome Measure  Help needed turning from your back to your side while in a flat bed without using bedrails?: A Lot Help needed moving from lying on your back to sitting on the side of a flat bed without using bedrails?: A Lot Help needed moving to and from a bed to a chair (including a wheelchair)?: Total Help needed standing up from a chair using your arms (e.g., wheelchair or bedside chair)?: Total Help needed to walk in hospital room?: Total Help needed climbing 3-5 steps with a railing? : Total 6 Click Score: 8    End of Session Equipment Utilized During Treatment: Oxygen Activity Tolerance: Treatment limited secondary to medical complications (Comment) Patient left: in bed;with call bell/phone within reach Nurse Communication: Mobility status PT Visit Diagnosis: Difficulty in walking, not elsewhere classified (R26.2)     Time: 1340-1420 PT Time Calculation (min) (ACUTE ONLY): 40 min  Charges:    TA [place] Pager [phone] Office [phone]    [name] 05/31/2020, 6:27 AM

## 2020-05-31 NOTE — Progress Notes (Signed)
Pt restless c/o SOB. Upon assessment pt had removed nasal canula and was only wearing only non rebreather. Placed pt in upright position and reapplied [place]. Pt refuses to maintain upright position. Educated on risks and benefits.

## 2020-05-31 NOTE — Progress Notes (Signed)
PROGRESS NOTE  Audrey Gross  DOB: 1961/09/28  PCP: Janith Lima, MD TKW:[id number]  DOA: 05/21/2020  LOS: 11 days   Chief Complaint  Patient presents with  . COVID Positive   Brief narrative: Audrey Gross is a 59 y.o. female with PMH of CLL on chemotherapy with her last treatment about 2 weeks prior to admission, paroxysmal atrial fibrillation on Eliquis, obesity, history of nonischemic cardiomyopathy. Patient was recently admitted 8/2-8/7 for Covid pneumonia.  She did not require oxygen at discharge.  Patient presented to the ED again on 05/19/2020 with progressively worsening shortness of breath for 5 days.  In the ED, she was hypoxic and required 15 L via nonrebreather to maintain oxygen saturation over 90%. Chest x-ray showed progression of bilateral lower lungs opacities. She was admitted for worsening respiratory failure.  Subjective: Patient was seen and examined this morning.   Patient remains on 30 L/min of high flow oxygen by nasal cannula. Labs this am with Cr up to 1.16.  CRP down.  BP runnning low in 90s and 100s.   Assessment/Plan: Progression of COVID pneumonia Acute respiratory failure with hypoxia  -Presented with worsening shortness of breath -recently hospitalized and treated 8/2-8/7 for Covid pneumonia with IV steroids, IV remdesivir. -Chest imaging: On admission showed worsening of bilateral infiltrates. -She was given a repeat 5-day course of IV remdesivir, completed on 8/20. She completed 5-day course of antibiotics because of elevated procalcitonin. She remains on baricitinib since 8/17. -She remains on Solu-Medrol IV 45 mg daily. -Supportive care: Vitamin C, Zinc, inhalers, Tylenol, Antitussives (benzonatate/ Mucinex/Tussionex)  -Protonix while on steroids. -Despite aggressive treatment, patient still remains on heated high flow oxygen, currently at 35 L/min.  Last chest x-ray from 8/23 showed increasing interstitial and airspace opacities consistent  with worsening multifocal pneumonia.  I would increase the dose of Solu-Medrol to 40 mg twice daily. -IV Lasix given intermittently, limited by blood pressure and renal function.  Blood pressures remains low, can give IV Lasix today. -Oxygen - SpO2: 95 % O2 Flow Rate (L/min): 35 L/min FiO2 (%): 90 % -Continue airborne/contact isolation precautions. -WBC and inflammatory markers trend as below.  Recent Labs  Lab 05/25/20 0506 05/26/20 0500 05/27/20 0445 05/28/20 0500 05/31/20 0500  WBC 8.2  --   --  10.7* 10.9*  DDIMER 8.73* 3.28* 2.84* 1.88*  --   CRP 1.6* 2.8* 1.6* 2.1* 0.8  ALT 30 23 41 38  --    The treatment plan and use of medications and known side effects were discussed with patient/family. Some of the medications used are based on case reports/anecdotal data.  All other medications being used in the management of COVID-19 based on limited study data.  Complete risks and long-term side effects are unknown, however in the best clinical judgment they seem to be of some benefit.  Patient wanted to proceed with treatment options provided.  Paroxysmal atrial fibrillation: -Continue metoprolol and subcu Lovenox. If clinically improves, will switch back to oral Eliquis.  Elevated Troponin: -Elevated likely secondary to Covid. Low suspicion for ACS. EKG from admission showed a baseline wander and was poor quality. Troponins are now normal.  -Echocardiogram shows normal EF with no wall motion abnormalities. Do not anticipate any further cardiac work-up at this time.  Normal anion gap metabolic acidosis  Appears to have resolved.  Continue to monitor.  Severe anxiety Patient started on Klonopin with some improvement in anxiety.  Continue for now.  Odynophagia Looks like she was being treated  empirically with fluconazole for candidal esophagitis.    CLL (chronic lymphocytic leukemia) Chemotherapy on hold. Continue bactrim and valganciclovir.  Nonischemic  cardiomyopathy Chronic systolic heart failure  -Chart review showed EF of 35% back in 2019.  Repeat echo showed improvement in EF to 50%.  -Continue metoprolol, Entresto. -Continue to monitor blood pressure, renal function.   Hyperglycemia Likely due to steroids.  HbA1c 5.7.  Blood glucose elevated to 209 this morning.  Will probably be higher with increased dose of steroids.  May have to start on Levemir tomorrow morning.  Mobility: Needs PT evaluation once oxygenation improves Code Status:   Code Status: Full Code  Nutritional status: Body mass index is 26.35 kg/m. Nutrition Problem: Increased nutrient needs Etiology: acute illness, catabolic illness Signs/Symptoms: estimated needs Diet Order            Diet regular Room service appropriate? Yes; Fluid consistency: Thin  Diet effective now                 DVT prophylaxis: Lovenox subcu   Antimicrobials:  On Bactrim and valganciclovir per oncology Fluid: None  Consultants: None Family Communication:  None at bedside  Status is: Inpatient  Remains inpatient appropriate because:Ongoing diagnostic testing needed not appropriate for outpatient work up and IV treatments appropriate due to intensity of illness or inability to take PO   Dispo:  Patient From: Home  Planned Disposition: To be determined  Expected discharge date: 06/01/20  Medically stable for discharge: No   Infusions:  . sodium chloride      Scheduled Meds: . (feeding supplement) PROSource Plus  30 mL Oral BID BM  . baricitinib  4 mg Oral Daily  . Chlorhexidine Gluconate Cloth  6 each Topical Daily  . clonazePAM  0.5 mg Oral BID  . diclofenac Sodium  2 g Topical QID  . enoxaparin (LOVENOX) injection  75 mg Subcutaneous Q12H  . feeding supplement (ENSURE ENLIVE)  237 mL Oral BID BM  . insulin aspart  0-9 Units Subcutaneous TID WC  . insulin detemir  5 Units Subcutaneous BID  . Ipratropium-Albuterol  1 puff Inhalation QID  . mouth rinse  15 mL  Mouth Rinse BID  . methylPREDNISolone (SOLU-MEDROL) injection  45 mg Intravenous Daily  . metoprolol succinate  50 mg Oral Daily  . multivitamin with minerals  1 tablet Oral Daily  . pantoprazole  40 mg Oral BID  . sacubitril-valsartan  1 tablet Oral BID  . sodium chloride flush  10-40 mL Intracatheter Q12H  . sulfamethoxazole-trimethoprim  1 tablet Oral Daily  . valGANciclovir  900 mg Oral Daily    Antimicrobials: Anti-infectives (From admission, onward)   Start     Dose/Rate Route Frequency Ordered Stop   05/21/20 1300  sulfamethoxazole-trimethoprim (BACTRIM) 400-80 MG per tablet 1 tablet        1 tablet Oral Daily 05/21/20 1147     05/21/20 1000  remdesivir 100 mg in sodium chloride 0.9 % 100 mL IVPB       "Followed by" Linked Group Details   100 mg 200 mL/hr over 30 Minutes Intravenous Daily 05/17/2020 1333 05/24/20 1039   05/21/20 0900  ceFEPIme (MAXIPIME) 2 g in sodium chloride 0.9 % 100 mL IVPB        2 g 200 mL/hr over 30 Minutes Intravenous Every 8 hours 05/21/20 0832 05/26/20 0922   05/23/2020 2200  valGANciclovir (VALCYTE) 450 MG tablet TABS 900 mg        900 mg Oral Daily  05/07/2020 1421     05/17/2020 2100  vancomycin (VANCOCIN) IVPB 1000 mg/200 mL premix  Status:  Discontinued        1,000 mg 200 mL/hr over 60 Minutes Intravenous Every 8 hours 05/26/2020 1239 05/21/20 1055   05/08/2020 1430  fluconazole (DIFLUCAN) tablet 150 mg        150 mg Oral Daily 05/13/2020 1421 05/26/20 2359   05/18/2020 1430  cefTRIAXone (ROCEPHIN) 2 g in sodium chloride 0.9 % 100 mL IVPB  Status:  Discontinued        2 g 200 mL/hr over 30 Minutes Intravenous Every 24 hours 06/01/2020 1421 05/21/20 0815   05/19/2020 1400  azithromycin (ZITHROMAX) 500 mg in sodium chloride 0.9 % 250 mL IVPB        500 mg 250 mL/hr over 60 Minutes Intravenous Every 24 hours 05/08/2020 1337 05/24/20 1515   05/18/2020 1333  remdesivir 200 mg in sodium chloride 0.9% 250 mL IVPB       "Followed by" Linked Group Details   200 mg 580  mL/hr over 30 Minutes Intravenous Once 05/14/2020 1333 05/19/2020 1614   05/13/2020 1245  vancomycin (VANCOREADY) IVPB 1500 mg/300 mL        1,500 mg 150 mL/hr over 120 Minutes Intravenous  Once 05/30/2020 1239 05/18/2020 1812   05/23/2020 1215  ceFEPIme (MAXIPIME) 2 g in sodium chloride 0.9 % 100 mL IVPB        2 g 200 mL/hr over 30 Minutes Intravenous  Once 05/17/2020 1213 06/05/2020 1350      PRN meds: sodium chloride, acetaminophen, albuterol, ALPRAZolam, guaiFENesin-dextromethorphan, hydrALAZINE, lip balm, morphine injection, ondansetron **OR** ondansetron (ZOFRAN) IV, oxyCODONE, polyethylene glycol, sodium chloride, sodium chloride flush   Objective: Vitals:   05/31/20 1343 05/31/20 1345  BP:    Pulse: 86 89  Resp: (!) 30 (!) 38  Temp:    SpO2: 95% 95%    Intake/Output Summary (Last 24 hours) at 05/31/2020 1449 Last data filed at 05/31/2020 0600 Gross per 24 hour  Intake 357 ml  Output 325 ml  Net 32 ml   Filed Weights   05/25/20 0429 05/26/20 0453 05/27/20 0438  Weight: 77.5 kg 77.1 kg 76.3 kg   Weight change:  Body mass index is 26.35 kg/m.   Physical Exam: General exam: Appears calm and comfortable.  Mild to moderate respite distress  skin: No rashes, lesions or ulcers. HEENT: Atraumatic, normocephalic, supple neck, no obvious bleeding Lungs: Clear to auscultation bilaterally CVS: Regular rate and rhythm, no murmur GI/Abd soft, nontender, nondistended, bowel sound present CNS: Half awake.  Able to follow commands, falls back asleep quick Psychiatry: Depressed look Extremities: No pedal edema, no calf tenderness  Data Review: I have personally reviewed the laboratory data and studies available.  Recent Labs  Lab 05/25/20 0506 05/28/20 0500 05/31/20 0500  WBC 8.2 10.7* 10.9*  NEUTROABS 1.0*  --   --   HGB 11.0* 11.7* 11.4*  HCT 33.6* 37.3 36.2  MCV 87.7 89.4 89.6  PLT 144* 130* 141*   Recent Labs  Lab 05/27/20 0445 05/28/20 0500 05/29/20 0540 05/30/20 0600  05/31/20 0500  NA 144 145 139 139 140  K 4.3 4.3 4.2 4.3 4.3  CL 112* 110 103 103 104  CO2 20* 24 23 22 22   GLUCOSE 272* 147* 173* 105* 139*  BUN 36* 40* 48* 61* 72*  CREATININE 0.74 0.69 0.82 0.96 1.16*  CALCIUM 8.8* 9.2 9.1 9.1 9.0   Lab Results  Component Value Date  HGBA1C 5.7 (H) 05/11/2020       Component Value Date/Time   CHOL 226 (H) 08/31/2019 0000   TRIG 139 05/07/2020 1110   HDL 65 08/31/2019 0000   CHOLHDL 5.5 07/16/2017 0103   VLDL 20 07/16/2017 0103   LDLCALC 140 (H) 08/31/2019 0000   LDLDIRECT 144.5 07/26/2013 0934   LABVLDL 21 08/31/2019 0000   Signed, Terrilee Croak, MD [facility] Pager: [phone] (Secure Chat preferred). 05/31/2020

## 2020-05-31 NOTE — Progress Notes (Signed)
OT Cancellation Note  Patient Details Name: Audrey Gross MRN: [id number] DOB: March 12, 1961   Cancelled Treatment:    Reason Eval/Treat Not Completed: Medical issues which prohibited therapy. RN requests therapy hold. Patient requiring increased oxygenation needs, increased workload of breathing and restlessness. Will f/u as able.   Estela Vinal L Miko Sirico 05/31/2020, 12:14 PM

## 2020-06-01 ENCOUNTER — Other Ambulatory Visit: Payer: Self-pay | Admitting: Cardiology

## 2020-06-01 LAB — BASIC METABOLIC PANEL
Anion gap: 11 (ref 5–15)
BUN: 68 mg/dL — ABNORMAL HIGH (ref 6–20)
CO2: 23 mmol/L (ref 22–32)
Calcium: 9 mg/dL (ref 8.9–10.3)
Chloride: 107 mmol/L (ref 98–111)
Creatinine, Ser: 0.96 mg/dL (ref 0.44–1.00)
GFR calc Af Amer: >60 mL/min (ref >60)
GFR calc non Af Amer: >60 mL/min (ref >60)
Glucose, Bld: 137 mg/dL — ABNORMAL HIGH (ref 70–99)
Potassium: 4.8 mmol/L (ref 3.5–5.1)
Sodium: 141 mmol/L (ref 135–145)

## 2020-06-01 LAB — GLUCOSE, CAPILLARY
Glucose-Capillary: 101 mg/dL — ABNORMAL HIGH (ref 70–99)
Glucose-Capillary: 161 mg/dL — ABNORMAL HIGH (ref 70–99)
Glucose-Capillary: 174 mg/dL — ABNORMAL HIGH (ref 70–99)
Glucose-Capillary: 153 mg/dL — ABNORMAL HIGH (ref 70–99)

## 2020-06-01 LAB — C-REACTIVE PROTEIN: CRP: <0.5 mg/dL (ref <1.0)

## 2020-06-01 MED ORDER — ALPRAZOLAM 0.5 MG PO TABS
0.5000 mg | ORAL_TABLET | Freq: Three times a day (TID) | ORAL | Status: DC | PRN
  Filled 2020-06-01: qty 1

## 2020-06-01 NOTE — Progress Notes (Signed)
Physical Therapy Treatment Patient Details Name: Audrey Gross MRN: [id number] DOB: 08-15-1961 Today's Date: 06/01/2020    History of Present Illness Audrey Gross is a 59 y.o. 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 with her last treatment about 2 weeks prior to admission, paroxysmal atrial fibrillation on Eliquis, obesity history of nonischemic cardiomyopathy with an EF of 35% back in 2019 repeated 2D echo that showed an improvement in ejection fraction to 50%, MR, Recently discharged from the hospital on 05/12/2020 and treated for COVID-19 infection, home off oxygen comes in 05/30/2020 with respiratory distress.    PT Comments    Pt very limited this day by tachypnea to 51 breaths/min, SpO2 80-84% with mobility, and severe dyspnea with any and all mobility. Pt tolerated seconds only of sitting EOB before uncontrolled descent to R sidelying on bed, but then immediately stating "bring the recliner over, I am getting in the chair". PT educated pt on current mobility level and limitations in mobility progression secondary to respiratory status, pt understands but is frustrated. PT to continue to progress mobility as able.     Follow Up Recommendations  SNF;Home health PT (depends on progress)     Equipment Recommendations  None recommended by PT    Recommendations for Other Services       Precautions / Restrictions Precautions Precautions: Fall Precaution Comments: anxious with mobility Restrictions Weight Bearing Restrictions: No    Mobility  Bed Mobility Overal bed mobility: Needs Assistance Bed Mobility: Supine to Sit;Sit to Supine;Rolling Rolling: Mod assist   Supine to sit: Max assist;HOB elevated Sit to supine: Max assist;HOB elevated   General bed mobility comments: max assist for LE and trunk management, righting posture once EOB. RR to 51 breaths/min and SpO2 to 80% on 30% Salter HFNC. Moved to sitting x2, with extended supine rests in between trials. Mod assist  for rolling to R for bed pan placement.  Transfers                 General transfer comment: deferred  Ambulation/Gait                 Stairs             Wheelchair Mobility    Modified Rankin (Stroke Patients Only)       Balance Overall balance assessment: Needs assistance Sitting-balance support: Feet unsupported Sitting balance-Leahy Scale: Poor Sitting balance - Comments: tolerates ~10 seconds upright before R lateral leaning from fatigue and DOE 4/4                                    Cognition Arousal/Alertness: Awake/alert Behavior During Therapy: Flat affect Overall Cognitive Status: No family/caregiver present to determine baseline cognitive functioning                                 General Comments: pt answers many questions monosyllabically, appears uninterested responding "okay, doesn't matter" to multiple questiosn related to mobility. Anxiety with mobility, with significant tachypnea with EOB sitting.      Exercises      General Comments General comments (skin integrity, edema, etc.): 30LO2 via salter HFNC, on NWB. SpO2 81-87%      Pertinent Vitals/Pain Pain Assessment: No/denies pain Pain Intervention(s): Monitored during session    Home Living  Prior Function            PT Goals (current goals can now be found in the care plan section) Acute Rehab PT Goals Patient Stated Goal: none stated PT Goal Formulation: With patient Time For Goal Achievement: 06/11/20 Potential to Achieve Goals: Fair Progress towards PT goals: Progressing toward goals    Frequency    Min 3X/week      PT Plan Discharge plan needs to be updated    Co-evaluation              AM-PAC PT "6 Clicks" Mobility   Outcome Measure  Help needed turning from your back to your side while in a flat bed without using bedrails?: A Lot Help needed moving from lying on your back to sitting  on the side of a flat bed without using bedrails?: Total Help needed moving to and from a bed to a chair (including a wheelchair)?: Total Help needed standing up from a chair using your arms (e.g., wheelchair or bedside chair)?: Total Help needed to walk in hospital room?: Total Help needed climbing 3-5 steps with a railing? : Total 6 Click Score: 7    End of Session Equipment Utilized During Treatment: Oxygen Activity Tolerance: Treatment limited secondary to medical complications (Comment) (anxiety, tachypnea) Patient left: in bed;with call bell/phone within reach;with bed alarm set Nurse Communication: Mobility status;Other (comment) (on bed pan) PT Visit Diagnosis: Difficulty in walking, not elsewhere classified (R26.2)     Time: 9935-7017 PT Time Calculation (min) (ACUTE ONLY): 25 min  Charges:  $Therapeutic Activity: 23-37 mins                     Devun Anna E, PT Acute Rehabilitation Services Pager [phone]  Office [phone]     Milliana Reddoch D Elonda Husky 06/01/2020, 6:15 PM

## 2020-06-01 NOTE — Progress Notes (Addendum)
PROGRESS NOTE  Audrey Gross  DOB: [birth year]  PCP: Janith Lima, MD BTD:[id number]  DOA: 05/08/2020  LOS: 12 days   Chief Complaint  Patient presents with  . COVID Positive   Brief narrative: Audrey Gross is a 58 y.o. female with PMH of CLL on chemotherapy with her last treatment about 2 weeks prior to admission, paroxysmal atrial fibrillation on Eliquis, obesity, history of nonischemic cardiomyopathy. Patient was recently admitted 8/2-8/7 for Covid pneumonia.  She did not require oxygen at discharge.  Patient presented to the ED again on 06/02/2020 with progressively worsening shortness of breath for 5 days.  In the ED, she was hypoxic and required 15 L via nonrebreather to maintain oxygen saturation over 90%. Chest x-ray showed progression of bilateral lower lungs opacities. She was admitted for worsening respiratory failure.  Subjective: Patient was seen and examined this morning.   Patient remains on 30 L/min of high flow oxygen by nasal cannula. Seems to have anxiety is a big issue.  At bedside, I asked her to take slow deep breathing which improved her oxygen saturation. Blood pressure 100s.  Assessment/Plan: Progression of COVID pneumonia Acute respiratory failure with hypoxia  -Presented with worsening shortness of breath -recently hospitalized and treated 8/2-8/7 for Covid pneumonia with IV steroids, IV remdesivir. -Chest imaging: On admission showed worsening of bilateral infiltrates. -She was given a repeat 5-day course of IV remdesivir, completed on 8/20. She completed 5-day course of antibiotics because of elevated procalcitonin. She remains on baricitinib since 8/17. -8/26, I increased her steroid dosing to Solu-Medrol IV 40 mg twice daily -Supportive care: Vitamin C, Zinc, inhalers, Tylenol, Antitussives (benzonatate/ Mucinex/Tussionex)  -Protonix while on steroids.  -Remains on 30 L/min of oxygen.  Unable to give more Lasix because of low blood  pressure. -Oxygen - SpO2: 93 % O2 Flow Rate (L/min): 30 L/min FiO2 (%): 70 % -Continue airborne/contact isolation precautions. -WBC and inflammatory markers trend as below.  Recent Labs  Lab 05/26/20 0500 05/27/20 0445 05/28/20 0500 05/31/20 0500 06/01/20 0349  WBC  --   --  10.7* 10.9*  --   DDIMER 3.28* 2.84* 1.88*  --   --   CRP 2.8* 1.6* 2.1* 0.8 <0.5  ALT 23 41 38  --   --    The treatment plan and use of medications and known side effects were discussed with patient/family. Some of the medications used are based on case reports/anecdotal data.  All other medications being used in the management of COVID-19 based on limited study data.  Complete risks and long-term side effects are unknown, however in the best clinical judgment they seem to be of some benefit.  Patient wanted to proceed with treatment options provided.  Paroxysmal atrial fibrillation: -Continue metoprolol and subcu Lovenox. If clinically improves, will switch back to oral Eliquis.  Elevated Troponin: -Elevated likely secondary to Covid. Low suspicion for ACS. Troponins are now normal.  -Echocardiogram shows normal EF with no wall motion abnormalities. Do not anticipate any further cardiac work-up at this time.  Normal anion gap metabolic acidosis  -Appears to have resolved. Continue to monitor.  Severe anxiety -Patient started on Klonopin with some improvement in anxiety. Continue for now. -I would also add Xanax 0.5 mg 3 times daily as needed.  Odynophagia Looks like she was being treated empirically with fluconazole for candidal esophagitis.    CLL (chronic lymphocytic leukemia) Chemotherapy on hold. Continue bactrim and valganciclovir.  Nonischemic cardiomyopathy Chronic systolic heart failure  -Chart review showed  EF of 35% back in 2019.  Repeat echo showed improvement in EF to 50%.  -Continue metoprolol, Entresto. -Continue to monitor blood pressure, renal function.    Hyperglycemia -No history of diabetes.  A1c 5.7.   -Blood sugar running high because of steroids.  Currently on Levemir 5 units twice daily as well as sliding scale insulin.   Mobility: Needs PT evaluation once oxygenation improves Code Status:   Code Status: Full Code  Nutritional status: Body mass index is 26.38 kg/m. Nutrition Problem: Increased nutrient needs Etiology: acute illness, catabolic illness Signs/Symptoms: estimated needs Diet Order            Diet regular Room service appropriate? Yes; Fluid consistency: Thin  Diet effective now                 DVT prophylaxis: Lovenox subcu   Antimicrobials:  On Bactrim and valganciclovir per oncology Fluid: None  Consultants: None Family Communication:  None at bedside  Status is: Inpatient  Remains inpatient appropriate because:Ongoing diagnostic testing needed not appropriate for outpatient work up and IV treatments appropriate due to intensity of illness or inability to take PO   Dispo:  Patient From: Home  Planned Disposition: To be determined  Expected discharge date: 06/01/20  Medically stable for discharge: No   Infusions:  . sodium chloride      Scheduled Meds: . (feeding supplement) PROSource Plus  30 mL Oral BID BM  . baricitinib  4 mg Oral Daily  . Chlorhexidine Gluconate Cloth  6 each Topical Daily  . clonazePAM  0.5 mg Oral BID  . diclofenac Sodium  2 g Topical QID  . enoxaparin (LOVENOX) injection  75 mg Subcutaneous Q12H  . feeding supplement (ENSURE ENLIVE)  237 mL Oral BID BM  . insulin aspart  0-9 Units Subcutaneous TID WC  . insulin detemir  5 Units Subcutaneous BID  . Ipratropium-Albuterol  1 puff Inhalation QID  . mouth rinse  15 mL Mouth Rinse BID  . methylPREDNISolone (SOLU-MEDROL) injection  40 mg Intravenous Q12H  . metoprolol succinate  50 mg Oral Daily  . multivitamin with minerals  1 tablet Oral Daily  . pantoprazole  40 mg Oral BID  . sacubitril-valsartan  1 tablet Oral  BID  . sodium chloride flush  10-40 mL Intracatheter Q12H  . sulfamethoxazole-trimethoprim  1 tablet Oral Daily  . valGANciclovir  900 mg Oral Daily    Antimicrobials: Anti-infectives (From admission, onward)   Start     Dose/Rate Route Frequency Ordered Stop   05/21/20 1300  sulfamethoxazole-trimethoprim (BACTRIM) 400-80 MG per tablet 1 tablet        1 tablet Oral Daily 05/21/20 1147     05/21/20 1000  remdesivir 100 mg in sodium chloride 0.9 % 100 mL IVPB       "Followed by" Linked Group Details   100 mg 200 mL/hr over 30 Minutes Intravenous Daily 05/26/2020 1333 05/24/20 1039   05/21/20 0900  ceFEPIme (MAXIPIME) 2 g in sodium chloride 0.9 % 100 mL IVPB        2 g 200 mL/hr over 30 Minutes Intravenous Every 8 hours 05/21/20 0832 05/26/20 0922   06/03/2020 2200  valGANciclovir (VALCYTE) 450 MG tablet TABS 900 mg        900 mg Oral Daily 05/11/2020 1421     05/16/2020 2100  vancomycin (VANCOCIN) IVPB 1000 mg/200 mL premix  Status:  Discontinued        1,000 mg 200 mL/hr over 60 Minutes  Intravenous Every 8 hours 05/15/2020 1239 05/21/20 1055   05/31/2020 1430  fluconazole (DIFLUCAN) tablet 150 mg        150 mg Oral Daily 06/04/2020 1421 05/26/20 2359   05/06/2020 1430  cefTRIAXone (ROCEPHIN) 2 g in sodium chloride 0.9 % 100 mL IVPB  Status:  Discontinued        2 g 200 mL/hr over 30 Minutes Intravenous Every 24 hours 05/08/2020 1421 05/21/20 0815   05/24/2020 1400  azithromycin (ZITHROMAX) 500 mg in sodium chloride 0.9 % 250 mL IVPB        500 mg 250 mL/hr over 60 Minutes Intravenous Every 24 hours 05/23/2020 1337 05/24/20 1515   05/19/2020 1333  remdesivir 200 mg in sodium chloride 0.9% 250 mL IVPB       "Followed by" Linked Group Details   200 mg 580 mL/hr over 30 Minutes Intravenous Once 06/05/2020 1333 05/08/2020 1614   05/12/2020 1245  vancomycin (VANCOREADY) IVPB 1500 mg/300 mL        1,500 mg 150 mL/hr over 120 Minutes Intravenous  Once 05/27/2020 1239 06/01/2020 1812   05/17/2020 1215  ceFEPIme (MAXIPIME) 2  g in sodium chloride 0.9 % 100 mL IVPB        2 g 200 mL/hr over 30 Minutes Intravenous  Once 05/24/2020 1213 06/02/2020 1350      PRN meds: sodium chloride, acetaminophen, albuterol, ALPRAZolam, guaiFENesin-dextromethorphan, hydrALAZINE, lip balm, morphine injection, ondansetron **OR** ondansetron (ZOFRAN) IV, oxyCODONE, polyethylene glycol, sodium chloride, sodium chloride flush   Objective: Vitals:   06/01/20 1226 06/01/20 1419  BP:    Pulse:    Resp:    Temp: 97.7 F (36.5 C)   SpO2:  93%    Intake/Output Summary (Last 24 hours) at 06/01/2020 1420 Last data filed at 05/31/2020 2200 Gross per 24 hour  Intake 340 ml  Output 350 ml  Net -10 ml   Filed Weights   05/26/20 0453 05/27/20 0438 06/01/20 0403  Weight: 77.1 kg 76.3 kg 76.4 kg   Weight change:  Body mass index is 26.38 kg/m.   Physical Exam: General exam: Appears calm and comfortable.  Mild to moderate respiratory distress  skin: No rashes, lesions or ulcers. HEENT: Atraumatic, normocephalic, supple neck, no obvious bleeding Lungs: Tachypneic.  Clear to auscultation bilaterally CVS: Regular rate and rhythm, no murmur GI/Abd soft, nontender, nondistended, bowel sound present CNS: Half awake.  Able to follow commands, falls back asleep quick Psychiatry: Anxious, tachypneic Extremities: No pedal edema, no calf tenderness  Data Review: I have personally reviewed the laboratory data and studies available.  Recent Labs  Lab 05/28/20 0500 05/31/20 0500  WBC 10.7* 10.9*  HGB 11.7* 11.4*  HCT 37.3 36.2  MCV 89.4 89.6  PLT 130* 141*   Recent Labs  Lab 05/28/20 0500 05/29/20 0540 05/30/20 0600 05/31/20 0500 06/01/20 0349  NA 145 139 139 140 141  K 4.3 4.2 4.3 4.3 4.8  CL 110 103 103 104 107  CO2 24 23 22 22 23   GLUCOSE 147* 173* 105* 139* 137*  BUN 40* 48* 61* 72* 68*  CREATININE 0.69 0.82 0.96 1.16* 0.96  CALCIUM 9.2 9.1 9.1 9.0 9.0   Lab Results  Component Value Date   HGBA1C 5.7 (H) 05/11/2020        Component Value Date/Time   CHOL 226 (H) 08/31/2019 0000   TRIG 139 06/04/2020 1110   HDL 65 08/31/2019 0000   CHOLHDL 5.5 07/16/2017 0103   VLDL 20 07/16/2017 0103  LDLCALC 140 (H) 08/31/2019 0000   LDLDIRECT 144.5 07/26/2013 0934   LABVLDL 21 08/31/2019 0000   Signed, Terrilee Croak, MD [facility] Pager: [phone] (Secure Chat preferred). 06/01/2020

## 2020-06-02 LAB — GLUCOSE, CAPILLARY
Glucose-Capillary: 97 mg/dL (ref 70–99)
Glucose-Capillary: 108 mg/dL — ABNORMAL HIGH (ref 70–99)
Glucose-Capillary: 91 mg/dL (ref 70–99)
Glucose-Capillary: 143 mg/dL — ABNORMAL HIGH (ref 70–99)

## 2020-06-02 LAB — BASIC METABOLIC PANEL
Anion gap: 9 (ref 5–15)
BUN: 63 mg/dL — ABNORMAL HIGH (ref 6–20)
CO2: 23 mmol/L (ref 22–32)
Calcium: 9 mg/dL (ref 8.9–10.3)
Chloride: 106 mmol/L (ref 98–111)
Creatinine, Ser: 0.96 mg/dL (ref 0.44–1.00)
GFR calc Af Amer: >60 mL/min (ref >60)
GFR calc non Af Amer: >60 mL/min (ref >60)
Glucose, Bld: 118 mg/dL — ABNORMAL HIGH (ref 70–99)
Potassium: 4.5 mmol/L (ref 3.5–5.1)
Sodium: 138 mmol/L (ref 135–145)

## 2020-06-02 LAB — BLOOD GAS, ARTERIAL
Acid-base deficit: 1.7 mmol/L (ref 0.0–2.0)
Bicarbonate: 21.1 mmol/L (ref 20.0–28.0)
FIO2: 30
O2 Saturation: 94.6 %
Patient temperature: 98.6
pCO2 arterial: 30.9 mmHg — ABNORMAL LOW (ref 32.0–48.0)
pH, Arterial: 7.45 (ref 7.350–7.450)
pO2, Arterial: 77.9 mmHg — ABNORMAL LOW (ref 83.0–108.0)

## 2020-06-02 LAB — C-REACTIVE PROTEIN: CRP: 0.6 mg/dL (ref <1.0)

## 2020-06-02 MED ORDER — ALPRAZOLAM 1 MG PO TABS
1.0000 mg | ORAL_TABLET | Freq: Three times a day (TID) | ORAL | Status: DC | PRN
  Administered 2020-06-02 – 2020-06-03 (×2): 1 mg via ORAL
  Filled 2020-06-02 (×2): qty 1

## 2020-06-02 MED ORDER — SODIUM CHLORIDE 0.9 % IV SOLN: INTRAVENOUS | Status: DC

## 2020-06-02 NOTE — Progress Notes (Signed)
Pt SpO2 in 30s outside of room. Upon entering room, pt found with HHFNC off face with both hands covering mouth with eyes closed. SpO2 down to 25%. RN x2 and Respiratoy in room. RN removed hands from mouth, HHFNC and NRB mask placed on patient. SpO2 increased to 97%. Pt able to answer name, DOB, and place. Pt states she doesn't know how the [place] came off her face. Pt more lethargic than before and speaks with eyes closed. Pt denies removing oxygen on purpose and denies thoughts of harming herself. Pt stated she is tired, mentally and physically. When asked when the last time she spoke to her family, she stated last week. RN encouraged pt to communicate with family, but pt states she is just too tired. Charge RN and Dr. Pietro Cassis notified. ABG ordered. Will continue to monitor.

## 2020-06-02 NOTE — Progress Notes (Signed)
OT Cancellation Note  Patient Details Name: Audrey Gross MRN: [id number] DOB: [birth year]   Cancelled Treatment:    Reason Eval/Treat Not Completed: Medical issues which prohibited therapy. Patient lethargic and potentially going onto Bipap pending ABG. Will hold for now and f/u as able.   Vaughn Beaumier L Perl Folmar 06/02/2020, 2:28 PM

## 2020-06-02 NOTE — Progress Notes (Signed)
PROGRESS NOTE  Audrey Gross  DOB: [birth year]  PCP: Janith Lima, MD HUD:[id number]  DOA: 05/19/2020  LOS: 13 days   Chief Complaint  Patient presents with  . COVID Positive   Brief narrative: Audrey Gross is a 59 y.o. female with PMH of CLL on chemotherapy with her last treatment about 2 weeks prior to admission, paroxysmal atrial fibrillation on Eliquis, obesity, history of nonischemic cardiomyopathy. Patient was recently admitted 8/2-8/7 for Covid pneumonia.  She did not require oxygen at discharge.  Patient presented to the ED again on 06/05/2020 with progressively worsening shortness of breath for 5 days.  In the ED, she was hypoxic and required 15 L via nonrebreather to maintain oxygen saturation over 90%. Chest x-ray showed progression of bilateral lower lungs opacities. She was admitted for worsening respiratory failure.  Subjective: Patient was seen and examined this morning.   Anxiety seems to be under big issue.  She is on 30 L oxygen by nasal cannula, maintaining oxygen saturation 100% and is breathing fast. We discussed about her anxiety.  She states that her oxygen saturation runs 99-100 and she used scared to lie down. Blood pressure running low in 90s. CRP low.  Assessment/Plan: Progression of COVID pneumonia Acute respiratory failure with hypoxia  -Presented with worsening shortness of breath -recently hospitalized and treated 8/2-8/7 for Covid pneumonia with IV steroids, IV remdesivir. -Chest imaging: On admission showed worsening of bilateral infiltrates. -She was given a repeat 5-day course of IV remdesivir, completed on 8/20. She completed 5-day course of antibiotics because of elevated procalcitonin. She remains on baricitinib since 8/17. -Currently on Solu-Medrol IV 40 mg twice daily.  Continue the same -Supportive care: Vitamin C, Zinc, inhalers, Tylenol, Antitussives (benzonatate/ Mucinex/Tussionex)  -Protonix while on steroids.  -Remains on 30 L/min  of oxygen.  We will continue report of weaning down oxygen.  I increased her dose of Xanax today to improve anxiety which will help to wean down oxygen. -Oxygen - SpO2: 93 % O2 Flow Rate (L/min): 30 L/min FiO2 (%): 90 % -Continue airborne/contact isolation precautions. -WBC and inflammatory markers trend as below.  Recent Labs  Lab 05/27/20 0445 05/28/20 0500 05/31/20 0500 06/01/20 0349 06/02/20 0500  WBC  --  10.7* 10.9*  --   --   DDIMER 2.84* 1.88*  --   --   --   CRP 1.6* 2.1* 0.8 <0.5 0.6  ALT 41 38  --   --   --    The treatment plan and use of medications and known side effects were discussed with patient/family. Some of the medications used are based on case reports/anecdotal data.  All other medications being used in the management of COVID-19 based on limited study data.  Complete risks and long-term side effects are unknown, however in the best clinical judgment they seem to be of some benefit.  Patient wanted to proceed with treatment options provided.  Severe anxiety -Severe anxiety has been a reason of for inability to wean down oxygen. -Continue Klonopin. I would increase the dose of Xanax to 1 mg 3 times daily as needed.  Paroxysmal atrial fibrillation: -Continue metoprolol and subcu Lovenox. If clinically improves, will switch back to oral Eliquis.  Elevated Troponin: -Elevated likely secondary to Covid. Low suspicion for ACS. Troponins are now normal.  -Echocardiogram shows normal EF with no wall motion abnormalities. Do not anticipate any further cardiac work-up at this time.  Normal anion gap metabolic acidosis  -Appears to have resolved. Continue to  monitor.  Odynophagia Looks like she was being treated empirically with fluconazole for candidal esophagitis.    CLL (chronic lymphocytic leukemia) Chemotherapy on hold. Continue bactrim and valganciclovir.  Nonischemic cardiomyopathy Chronic systolic heart failure  -Chart review showed EF of 35% back  in 2019.  Repeat echo showed improvement in EF to 50%.  -Continue metoprolol, Entresto. -Continue to monitor blood pressure, renal function.   Hyperglycemia -No history of diabetes.  A1c 5.7.   -Blood sugar running high because of steroids.  Currently on Levemir 5 units twice daily as well as sliding scale insulin.   Mobility: Needs PT evaluation once oxygenation improves Code Status:   Code Status: Full Code  Nutritional status: Body mass index is 25.83 kg/m. Nutrition Problem: Increased nutrient needs Etiology: acute illness, catabolic illness Signs/Symptoms: estimated needs Diet Order            Diet regular Room service appropriate? Yes; Fluid consistency: Thin  Diet effective now                 DVT prophylaxis: Lovenox subcu   Antimicrobials:  On Bactrim and valganciclovir per oncology Fluid: None  Consultants: None Family Communication:  None at bedside  Status is: Inpatient  Remains inpatient appropriate because:Ongoing diagnostic testing needed not appropriate for outpatient work up and IV treatments appropriate due to intensity of illness or inability to take PO   Dispo:  Patient From: Home  Planned Disposition: To be determined  Expected discharge date: To be determined  medically stable for discharge: No   Infusions:  . sodium chloride      Scheduled Meds: . (feeding supplement) PROSource Plus  30 mL Oral BID BM  . baricitinib  4 mg Oral Daily  . Chlorhexidine Gluconate Cloth  6 each Topical Daily  . clonazePAM  0.5 mg Oral BID  . diclofenac Sodium  2 g Topical QID  . enoxaparin (LOVENOX) injection  75 mg Subcutaneous Q12H  . feeding supplement (ENSURE ENLIVE)  237 mL Oral BID BM  . insulin aspart  0-9 Units Subcutaneous TID WC  . insulin detemir  5 Units Subcutaneous BID  . Ipratropium-Albuterol  1 puff Inhalation QID  . mouth rinse  15 mL Mouth Rinse BID  . methylPREDNISolone (SOLU-MEDROL) injection  40 mg Intravenous Q12H  . metoprolol  succinate  50 mg Oral Daily  . multivitamin with minerals  1 tablet Oral Daily  . pantoprazole  40 mg Oral BID  . sacubitril-valsartan  1 tablet Oral BID  . sodium chloride flush  10-40 mL Intracatheter Q12H  . sulfamethoxazole-trimethoprim  1 tablet Oral Daily  . valGANciclovir  900 mg Oral Daily    Antimicrobials: Anti-infectives (From admission, onward)   Start     Dose/Rate Route Frequency Ordered Stop   05/21/20 1300  sulfamethoxazole-trimethoprim (BACTRIM) 400-80 MG per tablet 1 tablet        1 tablet Oral Daily 05/21/20 1147     05/21/20 1000  remdesivir 100 mg in sodium chloride 0.9 % 100 mL IVPB       "Followed by" Linked Group Details   100 mg 200 mL/hr over 30 Minutes Intravenous Daily 05/06/2020 1333 05/24/20 1039   05/21/20 0900  ceFEPIme (MAXIPIME) 2 g in sodium chloride 0.9 % 100 mL IVPB        2 g 200 mL/hr over 30 Minutes Intravenous Every 8 hours 05/21/20 0832 05/26/20 0922   05/10/2020 2200  valGANciclovir (VALCYTE) 450 MG tablet TABS 900 mg  900 mg Oral Daily 05/31/2020 1421     05/30/2020 2100  vancomycin (VANCOCIN) IVPB 1000 mg/200 mL premix  Status:  Discontinued        1,000 mg 200 mL/hr over 60 Minutes Intravenous Every 8 hours 06/03/2020 1239 05/21/20 1055   05/29/2020 1430  fluconazole (DIFLUCAN) tablet 150 mg        150 mg Oral Daily 05/31/2020 1421 05/26/20 2359   05/26/2020 1430  cefTRIAXone (ROCEPHIN) 2 g in sodium chloride 0.9 % 100 mL IVPB  Status:  Discontinued        2 g 200 mL/hr over 30 Minutes Intravenous Every 24 hours 05/24/2020 1421 05/21/20 0815   06/03/2020 1400  azithromycin (ZITHROMAX) 500 mg in sodium chloride 0.9 % 250 mL IVPB        500 mg 250 mL/hr over 60 Minutes Intravenous Every 24 hours 05/19/2020 1337 05/24/20 1515   05/22/2020 1333  remdesivir 200 mg in sodium chloride 0.9% 250 mL IVPB       "Followed by" Linked Group Details   200 mg 580 mL/hr over 30 Minutes Intravenous Once 05/19/2020 1333 05/30/2020 1614   05/08/2020 1245  vancomycin  (VANCOREADY) IVPB 1500 mg/300 mL        1,500 mg 150 mL/hr over 120 Minutes Intravenous  Once 05/22/2020 1239 05/08/2020 1812   05/08/2020 1215  ceFEPIme (MAXIPIME) 2 g in sodium chloride 0.9 % 100 mL IVPB        2 g 200 mL/hr over 30 Minutes Intravenous  Once 05/28/2020 1213 06/03/2020 1350      PRN meds: sodium chloride, acetaminophen, albuterol, ALPRAZolam, guaiFENesin-dextromethorphan, hydrALAZINE, lip balm, morphine injection, ondansetron **OR** ondansetron (ZOFRAN) IV, oxyCODONE, polyethylene glycol, sodium chloride, sodium chloride flush   Objective: Vitals:   06/02/20 1249 06/02/20 1256  BP: 123/80   Pulse: (!) 112   Resp: (!) 23   Temp:  98.2 F (36.8 C)  SpO2: 93%     Intake/Output Summary (Last 24 hours) at 06/02/2020 1359 Last data filed at 06/02/2020 0600 Gross per 24 hour  Intake 480 ml  Output 500 ml  Net -20 ml   Filed Weights   05/27/20 0438 06/01/20 0403 06/02/20 0446  Weight: 76.3 kg 76.4 kg 74.8 kg   Weight change: -1.6 kg Body mass index is 25.83 kg/m.   Physical Exam: General exam: Anxious, shallow breathing.  Not in physical distress skin: No rashes, lesions or ulcers. HEENT: Atraumatic, normocephalic, supple neck, no obvious bleeding Lungs: Tachypneic.  Clear to auscultation bilaterally CVS: Regular rate and rhythm, no murmur GI/Abd soft, nontender, nondistended, bowel sound present CNS: Half awake.  Able to follow commands, falls back asleep quick Psychiatry: Anxious, tachypneic Extremities: No pedal edema, no calf tenderness  Data Review: I have personally reviewed the laboratory data and studies available.  Recent Labs  Lab 05/28/20 0500 05/31/20 0500  WBC 10.7* 10.9*  HGB 11.7* 11.4*  HCT 37.3 36.2  MCV 89.4 89.6  PLT 130* 141*   Recent Labs  Lab 05/29/20 0540 05/30/20 0600 05/31/20 0500 06/01/20 0349 06/02/20 0500  NA 139 139 140 141 138  K 4.2 4.3 4.3 4.8 4.5  CL 103 103 104 107 106  CO2 23 22 22 23 23   GLUCOSE 173* 105* 139*  137* 118*  BUN 48* 61* 72* 68* 63*  CREATININE 0.82 0.96 1.16* 0.96 0.96  CALCIUM 9.1 9.1 9.0 9.0 9.0   Lab Results  Component Value Date   HGBA1C 5.7 (H) 05/11/2020  Component Value Date/Time   CHOL 226 (H) 08/31/2019 0000   TRIG 139 05/06/2020 1110   HDL 65 08/31/2019 0000   CHOLHDL 5.5 07/16/2017 0103   VLDL 20 07/16/2017 0103   LDLCALC 140 (H) 08/31/2019 0000   LDLDIRECT 144.5 07/26/2013 0934   LABVLDL 21 08/31/2019 0000   Signed, Terrilee Croak, MD [facility] Pager: [phone] (Secure Chat preferred). 06/02/2020

## 2020-06-03 LAB — GLUCOSE, CAPILLARY
Glucose-Capillary: 191 mg/dL — ABNORMAL HIGH (ref 70–99)
Glucose-Capillary: 93 mg/dL (ref 70–99)
Glucose-Capillary: 97 mg/dL (ref 70–99)
Glucose-Capillary: 93 mg/dL (ref 70–99)

## 2020-06-03 LAB — CBC
HCT: 32.6 % — ABNORMAL LOW (ref 36.0–46.0)
Hemoglobin: 10.2 g/dL — ABNORMAL LOW (ref 12.0–15.0)
MCH: 28.4 pg (ref 26.0–34.0)
MCHC: 31.3 g/dL (ref 30.0–36.0)
MCV: 90.8 fL (ref 80.0–100.0)
Platelets: 108 10*3/uL — ABNORMAL LOW (ref 150–400)
RBC: 3.59 MIL/uL — ABNORMAL LOW (ref 3.87–5.11)
RDW: 14.7 % (ref 11.5–15.5)
WBC: 10.3 10*3/uL (ref 4.0–10.5)
nRBC: 0 % (ref 0.0–0.2)

## 2020-06-03 LAB — BLOOD GAS, ARTERIAL
Acid-base deficit: 3.2 mmol/L — ABNORMAL HIGH (ref 0.0–2.0)
Bicarbonate: 20.3 mmol/L (ref 20.0–28.0)
FIO2: 100
O2 Saturation: 91.7 %
Patient temperature: 96.2
pCO2 arterial: 30.9 mmHg — ABNORMAL LOW (ref 32.0–48.0)
pH, Arterial: 7.426 (ref 7.350–7.450)
pO2, Arterial: 64.7 mmHg — ABNORMAL LOW (ref 83.0–108.0)

## 2020-06-03 NOTE — Progress Notes (Signed)
Occupational Therapy Treatment Patient Details Name: Audrey Gross MRN: [id number] DOB: 1961-06-13 Today's Date: 06/03/2020    History of present illness Audrey Gross is a 59 y.o. 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 with her last treatment about 2 weeks prior to admission, paroxysmal atrial fibrillation on Eliquis, obesity history of nonischemic cardiomyopathy with an EF of 35% back in 2019 repeated 2D echo that showed an improvement in ejection fraction to 50%, MR, Recently discharged from the hospital on 05/12/2020 and treated for COVID-19 infection, home off oxygen comes in 05/28/2020 with respiratory distress.   OT comments  Audrey Gross presents today supine in bed on 15+ partial NRB mask with o2 sats fluctuating in the high 90s. Treatment focused heavily on education and instruction on improving breathing techniques to reduce tachypnea - with almost constant cues to breathe through nose and slow breathing down. Patient's RR between 22-47 throughout treatment. Patient transferred to side of bed with max assist, sat edge of bed approx 3 minutes. 2 out of 3 minutes patient leaning against therapist to maintain balance and tolerate activity. Patient returned to supine with total assist. Patient performed arm exercises x 10 each arm. Patient motivated to work on her legs as well. Patient performed heel slides with active assist using a sheet. Patient used upper extremities to pull sheet to assist with flexing hip and knee and therapist provided min assist to facilitate. Therapist provided moderate resistance on extension. Patient performed 2 sets of 5 on each leg with frequent rest breaks and verbal cues for breathing. Patient motivated and tolerated well with sats maintaining 92% and higher throughout activity. Continued to have elevated RR despite cueing. Continue to recommend short term rehab at [facility].   Follow Up Recommendations  SNF    Equipment Recommendations  Other (comment)     Recommendations for Other Services      Precautions / Restrictions Precautions Precautions: Fall Precaution Comments: Anxious, Poor breathing technique, Reliant on NRB Restrictions Weight Bearing Restrictions: No       Mobility Bed Mobility Overal bed mobility: Needs Assistance Bed Mobility: Supine to Sit;Sit to Supine     Supine to sit: Max assist;HOB elevated Sit to supine: Total assist   General bed mobility comments: Patient max assist to advance BLEs to edge of bed, trunk lift off and negotiation, use of pad to pivot patient's hip to edge of bed.  Transfers                      Balance Overall balance assessment: Needs assistance Sitting-balance support: No upper extremity supported;Feet unsupported Sitting balance-Leahy Scale: Poor Sitting balance - Comments: Mod assis to maintain edge of balance. Did not attempt to use arms to prop. Required leaning on therapist x 2 minutes to tolerate.                                   ADL either performed or assessed with clinical judgement   ADL                                               Vision   Vision Assessment?: No apparent visual deficits   Perception     Praxis      Cognition Arousal/Alertness: Awake/alert Behavior During Therapy: Anxious  Overall Cognitive Status: Within Functional Limits for tasks assessed                                          Exercises Other Exercises Other Exercises: Overhead Reaching with active assit x 10 each arm Other Exercises: Active Assist Heel Slides with sheet and therapist providing assist. 5 reps x 2 sets each leg   Shoulder Instructions       General Comments      Pertinent Vitals/ Pain       Pain Assessment: No/denies pain  Home Living                                          Prior Functioning/Environment              Frequency  Min 2X/week        Progress Toward  Goals  OT Goals(current goals can now be found in the care plan section)  Progress towards OT goals: Progressing toward goals  Acute Rehab OT Goals Patient Stated Goal: I want to work on my legs OT Goal Formulation: With patient Time For Goal Achievement: 06/11/20 Potential to Achieve Goals: [place] Discharge plan remains appropriate    Co-evaluation          OT goals addressed during session: Strengthening/ROM      AM-PAC OT "6 Clicks" Daily Activity     Outcome Measure   Help from another person eating meals?: A Lot Help from another person taking care of personal grooming?: Total Help from another person toileting, which includes using toliet, bedpan, or urinal?: Total Help from another person bathing (including washing, rinsing, drying)?: Total Help from another person to put on and taking off regular upper body clothing?: Total Help from another person to put on and taking off regular lower body clothing?: Total 6 Click Score: 7    End of Session Equipment Utilized During Treatment: Oxygen  OT Visit Diagnosis: Other abnormalities of gait and mobility (R26.89);Muscle weakness (generalized) (M62.81)   Activity Tolerance Patient limited by fatigue   Patient Left in bed;with call bell/phone within reach;with bed alarm set   Nurse Communication  (okay to see per RN)        Time: 9371-6967 OT Time Calculation (min): 28 min  Charges: OT General Charges $OT Visit: 1 Visit OT Treatments $Therapeutic Activity: 8-22 mins $Therapeutic Exercise: 8-22 mins  Derl Barrow, OTR/L [place]  Office [phone] Pager: [place] 06/03/2020, 12:17 PM

## 2020-06-03 NOTE — Progress Notes (Signed)
[place] for Lovenox Indication: atrial fibrillation  No Known Allergies  Patient Measurements: Height: 5\' 7"  (170.2 cm) Weight: 74.7 kg (164 lb 10.9 oz) IBW/kg (Calculated) : 61.6  Vital Signs: Temp: 97.6 F (36.4 C) (08/29 0856) Temp Source: Oral (08/29 0856) BP: 132/78 (08/29 1012) Pulse Rate: 76 (08/29 1012)  Labs: Recent Labs    06/01/20 0349 06/02/20 0500  CREATININE 0.96 0.96    Estimated Creatinine Clearance: 66.5 mL/min (by C-G formula based on SCr of 0.96 mg/dL).  Medications:  Scheduled:  . (feeding supplement) PROSource Plus  30 mL Oral BID BM  . baricitinib  4 mg Oral Daily  . Chlorhexidine Gluconate Cloth  6 each Topical Daily  . clonazePAM  0.5 mg Oral BID  . diclofenac Sodium  2 g Topical QID  . enoxaparin (LOVENOX) injection  75 mg Subcutaneous Q12H  . feeding supplement (ENSURE ENLIVE)  237 mL Oral BID BM  . insulin aspart  0-9 Units Subcutaneous TID WC  . insulin detemir  5 Units Subcutaneous BID  . Ipratropium-Albuterol  1 puff Inhalation QID  . mouth rinse  15 mL Mouth Rinse BID  . methylPREDNISolone (SOLU-MEDROL) injection  40 mg Intravenous Q12H  . metoprolol succinate  50 mg Oral Daily  . multivitamin with minerals  1 tablet Oral Daily  . pantoprazole  40 mg Oral BID  . sacubitril-valsartan  1 tablet Oral BID  . sodium chloride flush  10-40 mL Intracatheter Q12H  . sulfamethoxazole-trimethoprim  1 tablet Oral Daily  . valGANciclovir  900 mg Oral Daily   Infusions:  . sodium chloride    . sodium chloride 75 mL/hr at 06/03/20 6433    Assessment: 35 yoF re-admitted on 8/15 with COVID-19 pneumonia.  PMH includes Afib on Eliquis prior to admission.  Pharmacy is now consulted to dose Lovenox. 8/16 LE Dopplers: negative for DVT  06/03/2020 SCr decreased to 0.96, CrCl 65 ml/min CBC: Hgb down to 10.2, Plt down to 108 No bleeding noted  Goal of Therapy:  Anti-Xa level 0.6-1 units/ml 4hrs after LMWH  dose given Monitor platelets by anticoagulation protocol: Yes   Plan:  Continue Lovenox 1 mg/kg [place] q12h  Monitor renal function, CBC, s/s bleeding/thrombosis, transition back to [place] PharmD, BCPS Clinical Pharmacist [facility] main pharmacy [phone] 06/03/2020 10:48 AM

## 2020-06-03 NOTE — Progress Notes (Signed)
Notified by charge patient oxygen level in 40's. Enter room patient had pulled off Heated High Flow nasal cannula (HFNC) and NRB mask. Reapplied Heated HFNC and NRB @ 100%. Patient complaining of her Heart racing. EKG obtained. Respiratory @ bedside and Heated HFNC  increased to 70% and 30 liters and NRB @ 100% and ABG obtained. Oxygen saturation improved to 96% and  Dr. Pietro Cassis notified. Will continue to monitor patient and update MD as needed.

## 2020-06-03 NOTE — Progress Notes (Signed)
Patient rectal temp 94.0 F. Patient placed on Bair hugger applied and Dr. Pietro Cassis notified.

## 2020-06-03 NOTE — Progress Notes (Signed)
PROGRESS NOTE  Audrey Gross  DOB: 18-Nov-1960  PCP: Janith Lima, MD WEX:[id number]  DOA: 05/15/2020  LOS: 14 days   Chief Complaint  Patient presents with  . COVID Positive   Brief narrative: Audrey Gross is a 59 y.o. female with PMH of CLL on chemotherapy with her last treatment about 2 weeks prior to admission, paroxysmal atrial fibrillation on Eliquis, obesity, history of nonischemic cardiomyopathy. Patient was recently admitted 8/2-8/7 for Covid pneumonia.  She did not require oxygen at discharge.  Patient presented to the ED again on 05/09/2020 with progressively worsening shortness of breath for 5 days.  In the ED, she was hypoxic and required 15 L via nonrebreather to maintain oxygen saturation over 90%. Chest x-ray showed progression of bilateral lower lungs opacities. She was admitted for worsening respiratory failure.  Subjective: Patient was seen and examined this morning.   Lying in bed. Hyperventilating. Patient at least agreed this morning to wean down from heated high flow oxygen to nonrebreather mask.  Oxygen saturation was at 99%.  Assessment/Plan: Progression of COVID pneumonia Acute respiratory failure with hypoxia  -Presented with worsening shortness of breath -recently hospitalized and treated 8/2-8/7 for Covid pneumonia with IV steroids, IV remdesivir. -Chest imaging: On admission showed worsening of bilateral infiltrates.  -Treatment : She was given a repeat 5-day course of IV remdesivir, completed on 8/20. She completed 5-day course of antibiotics because of elevated procalcitonin. She remains on baricitinib since 8/17, a maximum of 14 days Currently on Solu-Medrol IV 40 mg twice daily.   -Oxygen - SpO2: 95 % O2 Flow Rate (L/min): 25 L/min (15 lpm with PRB mask) FiO2 (%): 60 %   Progression: I think patient would do okay on low-flow oxygen if she agrees to be weaned down.  I have explained to her that she does not need to target 99% O2 sat.  Her CRP  level is normal. -Continue IV steroids. Protonix while on steroids.  Xanax and Klonopin to control anxiety. -Repeat chest x-ray, labs including lactic acid tomorrow. -Supportive care: Vitamin C, Zinc, inhalers, Tylenol, Antitussives (benzonatate/ Mucinex/Tussionex)  -Continue airborne/contact isolation precautions. -WBC and inflammatory markers trend as below.  Recent Labs  Lab 05/28/20 0500 05/31/20 0500 06/01/20 0349 06/02/20 0500 06/03/20 1043  WBC 10.7* 10.9*  --   --  10.3  DDIMER 1.88*  --   --   --   --   CRP 2.1* 0.8 <0.5 0.6  --   ALT 38  --   --   --   --    The treatment plan and use of medications and known side effects were discussed with patient/family. Some of the medications used are based on case reports/anecdotal data.  All other medications being used in the management of COVID-19 based on limited study data.  Complete risks and long-term side effects are unknown, however in the best clinical judgment they seem to be of some benefit.  Patient wanted to proceed with treatment options provided.  Severe anxiety -Severe anxiety has been a reason of for inability to wean down oxygen. -Continue Klonopin and Xanax.    Paroxysmal atrial fibrillation: -Continue metoprolol and subcu Lovenox. If clinically improves, will switch back to oral Eliquis.  Elevated Troponin: -Elevated likely secondary to Covid. Low suspicion for ACS. Troponins are now normal.  -Echocardiogram shows normal EF with no wall motion abnormalities. Do not anticipate any further cardiac work-up at this time.  Normal anion gap metabolic acidosis  -Appears to have resolved.  Continue to monitor.  Odynophagia Looks like she was being treated empirically with fluconazole for candidal esophagitis.    CLL (chronic lymphocytic leukemia) Chemotherapy on hold. Continue bactrim and valganciclovir.  Nonischemic cardiomyopathy Chronic systolic heart failure  -Chart review showed EF of 35% back in 2019.  Repeat echo showed improvement in EF to 50%.  -Continue metoprolol, Entresto. -Currently patient is clinically dry.  BUN/creatinine ratio is significantly up at 63/0.96.  However has normal blood pressure and renal function.  Continue to monitor.  Hyperglycemia -No history of diabetes.  A1c 5.7.   -Blood sugar running high because of steroids.  Currently on Levemir 5 units twice daily as well as sliding scale insulin.   Mobility: Needs PT evaluation once oxygenation improves Code Status:   Code Status: Full Code  Nutritional status: Body mass index is 25.79 kg/m. Nutrition Problem: Increased nutrient needs Etiology: acute illness, catabolic illness Signs/Symptoms: estimated needs Diet Order            Diet regular Room service appropriate? Yes; Fluid consistency: Thin  Diet effective now                 DVT prophylaxis: Lovenox subcu   Antimicrobials:  On Bactrim and valganciclovir per oncology Fluid: None  Consultants: None Family Communication:  None at bedside  Status is: Inpatient  Remains inpatient appropriate because:Ongoing diagnostic testing needed not appropriate for outpatient work up and IV treatments appropriate due to intensity of illness or inability to take PO   Dispo:  Patient From: Home  Planned Disposition: To be determined  Expected discharge date: To be determined  medically stable for discharge: No   Infusions:  . sodium chloride    . sodium chloride 75 mL/hr at 06/03/20 0839    Scheduled Meds: . (feeding supplement) PROSource Plus  30 mL Oral BID BM  . baricitinib  4 mg Oral Daily  . Chlorhexidine Gluconate Cloth  6 each Topical Daily  . clonazePAM  0.5 mg Oral BID  . diclofenac Sodium  2 g Topical QID  . enoxaparin (LOVENOX) injection  75 mg Subcutaneous Q12H  . feeding supplement (ENSURE ENLIVE)  237 mL Oral BID BM  . insulin aspart  0-9 Units Subcutaneous TID WC  . insulin detemir  5 Units Subcutaneous BID  .  Ipratropium-Albuterol  1 puff Inhalation QID  . mouth rinse  15 mL Mouth Rinse BID  . methylPREDNISolone (SOLU-MEDROL) injection  40 mg Intravenous Q12H  . metoprolol succinate  50 mg Oral Daily  . multivitamin with minerals  1 tablet Oral Daily  . pantoprazole  40 mg Oral BID  . sacubitril-valsartan  1 tablet Oral BID  . sodium chloride flush  10-40 mL Intracatheter Q12H  . sulfamethoxazole-trimethoprim  1 tablet Oral Daily  . valGANciclovir  900 mg Oral Daily    Antimicrobials: Anti-infectives (From admission, onward)   Start     Dose/Rate Route Frequency Ordered Stop   05/21/20 1300  sulfamethoxazole-trimethoprim (BACTRIM) 400-80 MG per tablet 1 tablet        1 tablet Oral Daily 05/21/20 1147     05/21/20 1000  remdesivir 100 mg in sodium chloride 0.9 % 100 mL IVPB       "Followed by" Linked Group Details   100 mg 200 mL/hr over 30 Minutes Intravenous Daily 05/21/2020 1333 05/24/20 1039   05/21/20 0900  ceFEPIme (MAXIPIME) 2 g in sodium chloride 0.9 % 100 mL IVPB        2 g 200  mL/hr over 30 Minutes Intravenous Every 8 hours 05/21/20 0832 05/26/20 0922   05/09/2020 2200  valGANciclovir (VALCYTE) 450 MG tablet TABS 900 mg        900 mg Oral Daily 05/12/2020 1421     05/14/2020 2100  vancomycin (VANCOCIN) IVPB 1000 mg/200 mL premix  Status:  Discontinued        1,000 mg 200 mL/hr over 60 Minutes Intravenous Every 8 hours 05/23/2020 1239 05/21/20 1055   05/17/2020 1430  fluconazole (DIFLUCAN) tablet 150 mg        150 mg Oral Daily 05/16/2020 1421 05/26/20 2359   05/25/2020 1430  cefTRIAXone (ROCEPHIN) 2 g in sodium chloride 0.9 % 100 mL IVPB  Status:  Discontinued        2 g 200 mL/hr over 30 Minutes Intravenous Every 24 hours 05/13/2020 1421 05/21/20 0815   06/01/2020 1400  azithromycin (ZITHROMAX) 500 mg in sodium chloride 0.9 % 250 mL IVPB        500 mg 250 mL/hr over 60 Minutes Intravenous Every 24 hours 05/25/2020 1337 05/24/20 1515   05/28/2020 1333  remdesivir 200 mg in sodium chloride 0.9% 250  mL IVPB       "Followed by" Linked Group Details   200 mg 580 mL/hr over 30 Minutes Intravenous Once 05/14/2020 1333 05/21/2020 1614   05/19/2020 1245  vancomycin (VANCOREADY) IVPB 1500 mg/300 mL        1,500 mg 150 mL/hr over 120 Minutes Intravenous  Once 05/06/2020 1239 05/19/2020 1812   05/26/2020 1215  ceFEPIme (MAXIPIME) 2 g in sodium chloride 0.9 % 100 mL IVPB        2 g 200 mL/hr over 30 Minutes Intravenous  Once 06/05/2020 1213 05/30/2020 1350      PRN meds: sodium chloride, acetaminophen, albuterol, ALPRAZolam, guaiFENesin-dextromethorphan, hydrALAZINE, lip balm, morphine injection, ondansetron **OR** ondansetron (ZOFRAN) IV, oxyCODONE, polyethylene glycol, sodium chloride, sodium chloride flush   Objective: Vitals:   06/03/20 1228 06/03/20 1255  BP:    Pulse: 71   Resp: (!) 34   Temp: (!) 94 F (34.4 C) (!) 96.2 F (35.7 C)  SpO2: 95%     Intake/Output Summary (Last 24 hours) at 06/03/2020 1418 Last data filed at 06/03/2020 1254 Gross per 24 hour  Intake 653.16 ml  Output 975 ml  Net -321.84 ml   Filed Weights   06/01/20 0403 06/02/20 0446 06/03/20 0500  Weight: 76.4 kg 74.8 kg 74.7 kg   Weight change: -0.1 kg Body mass index is 25.79 kg/m.   Physical Exam: General exam: Anxious, shallow breathing.  Not in physical distress skin: No rashes, lesions or ulcers. HEENT: Atraumatic, normocephalic, supple neck, no obvious bleeding Lungs: Tachypneic.  Clear to auscultation bilaterally CVS: Regular rate and rhythm, no murmur GI/Abd soft, nontender, nondistended, bowel sound present CNS: Half awake.  Able to follow commands, falls back asleep quick Psychiatry: Anxious, tachypneic Extremities: No pedal edema, no calf tenderness  Data Review: I have personally reviewed the laboratory data and studies available.  Recent Labs  Lab 05/28/20 0500 05/31/20 0500 06/03/20 1043  WBC 10.7* 10.9* 10.3  HGB 11.7* 11.4* 10.2*  HCT 37.3 36.2 32.6*  MCV 89.4 89.6 90.8  PLT 130* 141*  108*   Recent Labs  Lab 05/29/20 0540 05/30/20 0600 05/31/20 0500 06/01/20 0349 06/02/20 0500  NA 139 139 140 141 138  K 4.2 4.3 4.3 4.8 4.5  CL 103 103 104 107 106  CO2 23 22 22 23 23   GLUCOSE 173*  105* 139* 137* 118*  BUN 48* 61* 72* 68* 63*  CREATININE 0.82 0.96 1.16* 0.96 0.96  CALCIUM 9.1 9.1 9.0 9.0 9.0   Lab Results  Component Value Date   HGBA1C 5.7 (H) 05/11/2020       Component Value Date/Time   CHOL 226 (H) 08/31/2019 0000   TRIG 139 05/18/2020 1110   HDL 65 08/31/2019 0000   CHOLHDL 5.5 07/16/2017 0103   VLDL 20 07/16/2017 0103   LDLCALC 140 (H) 08/31/2019 0000   LDLDIRECT 144.5 07/26/2013 0934   LABVLDL 21 08/31/2019 0000   Signed, Terrilee Croak, MD [facility] Pager: [phone] (Secure Chat preferred). 06/03/2020

## 2020-06-04 ENCOUNTER — Inpatient Hospital Stay (HOSPITAL_COMMUNITY): Payer: BC Managed Care – PPO

## 2020-06-04 LAB — CBC WITH DIFFERENTIAL/PLATELET
Abs Immature Granulocytes: 0.15 10*3/uL — ABNORMAL HIGH (ref 0.00–0.07)
Basophils Absolute: 0.1 10*3/uL (ref 0.0–0.1)
Basophils Relative: 1 %
Eosinophils Absolute: 0 10*3/uL (ref 0.0–0.5)
Eosinophils Relative: 0 %
HCT: 30.3 % — ABNORMAL LOW (ref 36.0–46.0)
Hemoglobin: 9.5 g/dL — ABNORMAL LOW (ref 12.0–15.0)
Immature Granulocytes: 1 %
Lymphocytes Relative: 64 %
Lymphs Abs: 6.8 10*3/uL — ABNORMAL HIGH (ref 0.7–4.0)
MCH: 28.7 pg (ref 26.0–34.0)
MCHC: 31.4 g/dL (ref 30.0–36.0)
MCV: 91.5 fL (ref 80.0–100.0)
Monocytes Absolute: 1.8 10*3/uL — ABNORMAL HIGH (ref 0.1–1.0)
Monocytes Relative: 16 %
Neutro Abs: 2 10*3/uL (ref 1.7–7.7)
Neutrophils Relative %: 18 %
Platelets: 98 10*3/uL — ABNORMAL LOW (ref 150–400)
RBC: 3.31 MIL/uL — ABNORMAL LOW (ref 3.87–5.11)
RDW: 14.9 % (ref 11.5–15.5)
WBC: 10.7 10*3/uL — ABNORMAL HIGH (ref 4.0–10.5)
nRBC: 0 % (ref 0.0–0.2)

## 2020-06-04 LAB — BASIC METABOLIC PANEL
Anion gap: 7 (ref 5–15)
BUN: 32 mg/dL — ABNORMAL HIGH (ref 6–20)
CO2: 20 mmol/L — ABNORMAL LOW (ref 22–32)
Calcium: 8.4 mg/dL — ABNORMAL LOW (ref 8.9–10.3)
Chloride: 112 mmol/L — ABNORMAL HIGH (ref 98–111)
Creatinine, Ser: 0.64 mg/dL (ref 0.44–1.00)
GFR calc Af Amer: >60 mL/min (ref >60)
GFR calc non Af Amer: >60 mL/min (ref >60)
Glucose, Bld: 104 mg/dL — ABNORMAL HIGH (ref 70–99)
Potassium: 4.7 mmol/L (ref 3.5–5.1)
Sodium: 139 mmol/L (ref 135–145)

## 2020-06-04 LAB — BLOOD GAS, ARTERIAL
Acid-base deficit: 6 mmol/L — ABNORMAL HIGH (ref 0.0–2.0)
Bicarbonate: 20.6 mmol/L (ref 20.0–28.0)
Drawn by: 441261
FIO2: 100
MECHVT: 370 mL
O2 Saturation: 98.2 %
PEEP: 8 cmH2O
Patient temperature: 98.6
RATE: 30 resp/min
pCO2 arterial: 48.2 mmHg — ABNORMAL HIGH (ref 32.0–48.0)
pH, Arterial: 7.253 — ABNORMAL LOW (ref 7.350–7.450)
pO2, Arterial: 145 mmHg — ABNORMAL HIGH (ref 83.0–108.0)

## 2020-06-04 LAB — PHOSPHORUS
Phosphorus: 2.4 mg/dL — ABNORMAL LOW (ref 2.5–4.6)
Phosphorus: 2.8 mg/dL (ref 2.5–4.6)

## 2020-06-04 LAB — GLUCOSE, CAPILLARY
Glucose-Capillary: 105 mg/dL — ABNORMAL HIGH (ref 70–99)
Glucose-Capillary: 125 mg/dL — ABNORMAL HIGH (ref 70–99)
Glucose-Capillary: 219 mg/dL — ABNORMAL HIGH (ref 70–99)
Glucose-Capillary: 86 mg/dL (ref 70–99)
Glucose-Capillary: 88 mg/dL (ref 70–99)

## 2020-06-04 LAB — LACTIC ACID, PLASMA: Lactic Acid, Venous: 1.2 mmol/L (ref 0.5–1.9)

## 2020-06-04 LAB — MAGNESIUM
Magnesium: 2.4 mg/dL (ref 1.7–2.4)
Magnesium: 2 mg/dL (ref 1.7–2.4)

## 2020-06-04 MED ORDER — CHLORHEXIDINE GLUCONATE 0.12% ORAL RINSE (MEDLINE KIT)
15.0000 mL | Freq: Two times a day (BID) | OROMUCOSAL | Status: DC
  Administered 2020-06-04 – 2020-06-13 (×19): 15 mL via OROMUCOSAL

## 2020-06-04 MED ORDER — FENTANYL CITRATE (PF) 100 MCG/2ML IJ SOLN: 50.0000 ug | Freq: Once | INTRAMUSCULAR | Status: DC

## 2020-06-04 MED ORDER — PHENYLEPHRINE 40 MCG/ML (10ML) SYRINGE FOR IV PUSH (FOR BLOOD PRESSURE SUPPORT)
PREFILLED_SYRINGE | INTRAVENOUS | Status: AC
  Filled 2020-06-04: qty 10

## 2020-06-04 MED ORDER — MIDAZOLAM HCL 2 MG/2ML IJ SOLN
1.0000 mg | INTRAMUSCULAR | Status: DC | PRN
  Administered 2020-06-05 – 2020-06-12 (×6): 2 mg via INTRAVENOUS
  Filled 2020-06-04 (×8): qty 2

## 2020-06-04 MED ORDER — PROPOFOL 1000 MG/100ML IV EMUL
0.0000 ug/kg/min | INTRAVENOUS | Status: DC
  Administered 2020-06-04: 5 ug/kg/min via INTRAVENOUS
  Administered 2020-06-04: 50 ug/kg/min via INTRAVENOUS
  Administered 2020-06-04: 35 ug/kg/min via INTRAVENOUS
  Administered 2020-06-05 (×2): 30 ug/kg/min via INTRAVENOUS
  Administered 2020-06-05 (×3): 50 ug/kg/min via INTRAVENOUS
  Administered 2020-06-06 (×2): 30 ug/kg/min via INTRAVENOUS
  Filled 2020-06-04 (×11): qty 100

## 2020-06-04 MED ORDER — PANTOPRAZOLE SODIUM 40 MG IV SOLR
40.0000 mg | Freq: Two times a day (BID) | INTRAVENOUS | Status: AC
  Administered 2020-06-04 – 2020-06-06 (×5): 40 mg via INTRAVENOUS
  Filled 2020-06-04 (×5): qty 40

## 2020-06-04 MED ORDER — VITAL HIGH PROTEIN PO LIQD
1000.0000 mL | ORAL | Status: DC
  Administered 2020-06-04 – 2020-06-05 (×2): 1000 mL

## 2020-06-04 MED ORDER — SODIUM CHLORIDE 0.9 % IV SOLN
2.0000 g | Freq: Three times a day (TID) | INTRAVENOUS | Status: AC
  Administered 2020-06-04 – 2020-06-08 (×14): 2 g via INTRAVENOUS
  Filled 2020-06-04 (×14): qty 2

## 2020-06-04 MED ORDER — VECURONIUM BROMIDE 10 MG IV SOLR
0.1000 mg/kg | INTRAVENOUS | Status: DC | PRN
  Administered 2020-06-05: 7.6 mg via INTRAVENOUS

## 2020-06-04 MED ORDER — PHENYLEPHRINE CONCENTRATED 100MG/250ML (0.4 MG/ML) INFUSION SIMPLE
0.0000 ug/min | INTRAVENOUS | Status: DC
  Administered 2020-06-04 – 2020-06-05 (×2): 150 ug/min via INTRAVENOUS
  Administered 2020-06-06: 20 ug/min via INTRAVENOUS
  Administered 2020-06-07: 70 ug/min via INTRAVENOUS
  Filled 2020-06-04 (×4): qty 250

## 2020-06-04 MED ORDER — PROSOURCE TF PO LIQD
45.0000 mL | Freq: Two times a day (BID) | ORAL | Status: DC
  Administered 2020-06-04 – 2020-06-05 (×2): 45 mL
  Filled 2020-06-04 (×2): qty 45

## 2020-06-04 MED ORDER — MIDAZOLAM HCL 2 MG/2ML IJ SOLN
INTRAMUSCULAR | Status: AC
  Administered 2020-06-04: 2 mg
  Filled 2020-06-04: qty 4

## 2020-06-04 MED ORDER — FENTANYL CITRATE (PF) 100 MCG/2ML IJ SOLN
INTRAMUSCULAR | Status: AC
  Administered 2020-06-04: 100 ug
  Filled 2020-06-04: qty 2

## 2020-06-04 MED ORDER — ETOMIDATE 2 MG/ML IV SOLN
INTRAVENOUS | Status: AC
  Administered 2020-06-04: 20 mg
  Filled 2020-06-04: qty 20

## 2020-06-04 MED ORDER — PHENYLEPHRINE HCL-NACL 10-0.9 MG/250ML-% IV SOLN
0.0000 ug/min | INTRAVENOUS | Status: DC
  Administered 2020-06-04 (×2): 60 ug/min via INTRAVENOUS
  Administered 2020-06-04: 200 ug/min via INTRAVENOUS
  Administered 2020-06-04: 90 ug/min via INTRAVENOUS
  Administered 2020-06-04: 20 ug/min via INTRAVENOUS
  Filled 2020-06-04 (×5): qty 250

## 2020-06-04 MED ORDER — FENTANYL BOLUS VIA INFUSION
50.0000 ug | INTRAVENOUS | Status: DC | PRN
  Administered 2020-06-04 – 2020-06-13 (×19): 50 ug via INTRAVENOUS
  Filled 2020-06-04: qty 50

## 2020-06-04 MED ORDER — VANCOMYCIN HCL IN DEXTROSE 1-5 GM/200ML-% IV SOLN
1000.0000 mg | Freq: Two times a day (BID) | INTRAVENOUS | Status: DC
  Administered 2020-06-05 – 2020-06-07 (×5): 1000 mg via INTRAVENOUS
  Filled 2020-06-04 (×6): qty 200

## 2020-06-04 MED ORDER — DEXMEDETOMIDINE HCL IN NACL 200 MCG/50ML IV SOLN
0.4000 ug/kg/h | INTRAVENOUS | Status: DC
  Administered 2020-06-04: 0.4 ug/kg/h via INTRAVENOUS
  Filled 2020-06-04: qty 50

## 2020-06-04 MED ORDER — MIDAZOLAM HCL 2 MG/2ML IJ SOLN
INTRAMUSCULAR | Status: AC
  Administered 2020-06-04: 2 mg via INTRAVENOUS
  Filled 2020-06-04: qty 2

## 2020-06-04 MED ORDER — VECURONIUM BROMIDE 10 MG IV SOLR
INTRAVENOUS | Status: AC
  Administered 2020-06-04: 7.6 mg via INTRAVENOUS
  Filled 2020-06-04: qty 10

## 2020-06-04 MED ORDER — ROCURONIUM BROMIDE 10 MG/ML (PF) SYRINGE
PREFILLED_SYRINGE | INTRAVENOUS | Status: AC
  Administered 2020-06-04: 60 mg
  Filled 2020-06-04: qty 10

## 2020-06-04 MED ORDER — CLONAZEPAM 0.5 MG PO TABS
0.5000 mg | ORAL_TABLET | Freq: Two times a day (BID) | ORAL | Status: DC
  Administered 2020-06-04 – 2020-06-05 (×4): 0.5 mg
  Filled 2020-06-04 (×5): qty 1

## 2020-06-04 MED ORDER — VANCOMYCIN HCL 1500 MG/300ML IV SOLN
1500.0000 mg | Freq: Once | INTRAVENOUS | Status: AC
  Administered 2020-06-04: 1500 mg via INTRAVENOUS
  Filled 2020-06-04: qty 300

## 2020-06-04 MED ORDER — ORAL CARE MOUTH RINSE
15.0000 mL | OROMUCOSAL | Status: DC
  Administered 2020-06-04 – 2020-06-13 (×88): 15 mL via OROMUCOSAL

## 2020-06-04 MED ORDER — POLYETHYLENE GLYCOL 3350 17 G PO PACK
17.0000 g | PACK | Freq: Every day | ORAL | Status: DC
  Administered 2020-06-04 – 2020-06-09 (×6): 17 g
  Filled 2020-06-04 (×7): qty 1

## 2020-06-04 MED ORDER — FUROSEMIDE 10 MG/ML IJ SOLN
40.0000 mg | Freq: Once | INTRAMUSCULAR | Status: AC
  Administered 2020-06-04: 40 mg via INTRAVENOUS
  Filled 2020-06-04: qty 4

## 2020-06-04 MED ORDER — DOCUSATE SODIUM 50 MG/5ML PO LIQD
100.0000 mg | Freq: Two times a day (BID) | ORAL | Status: DC
  Administered 2020-06-04 – 2020-06-09 (×11): 100 mg
  Filled 2020-06-04 (×12): qty 10

## 2020-06-04 MED ORDER — FENTANYL 2500MCG IN NS 250ML (10MCG/ML) PREMIX INFUSION
50.0000 ug/h | INTRAVENOUS | Status: DC
  Administered 2020-06-04: 50 ug/h via INTRAVENOUS
  Administered 2020-06-05: 200 ug/h via INTRAVENOUS
  Administered 2020-06-05: 250 ug/h via INTRAVENOUS
  Administered 2020-06-05: 350 ug/h via INTRAVENOUS
  Administered 2020-06-06 (×2): 200 ug/h via INTRAVENOUS
  Administered 2020-06-07: 300 ug/h via INTRAVENOUS
  Administered 2020-06-07 – 2020-06-08 (×4): 350 ug/h via INTRAVENOUS
  Administered 2020-06-08 – 2020-06-09 (×2): 300 ug/h via INTRAVENOUS
  Administered 2020-06-09 – 2020-06-10 (×2): 200 ug/h via INTRAVENOUS
  Administered 2020-06-10: 250 ug/h via INTRAVENOUS
  Administered 2020-06-10: 200 ug/h via INTRAVENOUS
  Administered 2020-06-11 (×2): 250 ug/h via INTRAVENOUS
  Administered 2020-06-12 (×2): 300 ug/h via INTRAVENOUS
  Administered 2020-06-12: 350 ug/h via INTRAVENOUS
  Administered 2020-06-13: 400 ug/h via INTRAVENOUS
  Administered 2020-06-13: 350 ug/h via INTRAVENOUS
  Filled 2020-06-04 (×24): qty 250

## 2020-06-04 NOTE — Progress Notes (Signed)
[place] Progress Note Patient Name: Audrey Gross DOB: 1961/02/02 MRN: [id number]   Date of Service  06/04/2020  HPI/Events of Note  Patient with ventilator dyssynchrony and desaturation.  eICU Interventions  PRN Versed and Vecuronium orders added to the [facility].        Frederik Pear 06/04/2020, 10:38 PM

## 2020-06-04 NOTE — Progress Notes (Signed)
PROGRESS NOTE  Audrey Gross  DOB: 06-19-1961  PCP: Janith Lima, MD WCB:[id number]  DOA: 05/21/2020  LOS: 15 days   Chief Complaint  Patient presents with  . COVID Positive   Brief narrative: Audrey Gross is a 59 y.o. female with PMH of CLL on chemotherapy with her last treatment about 2 weeks prior to admission, paroxysmal atrial fibrillation on Eliquis, obesity, history of nonischemic cardiomyopathy. Patient was recently admitted 8/2-8/7 for Covid pneumonia.  She did not require oxygen at discharge.  Patient presented to the ED again on 05/14/2020 with progressively worsening shortness of breath for 5 days.  In the ED, she was hypoxic and required 15 L via nonrebreather to maintain oxygen saturation over 90%. Chest x-ray showed progression of bilateral lower lungs opacities. She was admitted for worsening respiratory failure.  Subjective: Patient was seen and examined this morning.   Tachypneic.  Requiring higher level oxygen this morning. Earlier, patient was removing her mask and dropping down O2 sat to 60s. Patient seen together with critical care team. Chest x-ray from this morning reviewed which showed minimal pneumomediastinum as well. A long discussion was done with patient.  She wanted to continue aggressive care including intubation. Patient was intubated later this morning.  Assessment/Plan: Progression of COVID pneumonia Acute respiratory failure with hypoxia  -Presented with worsening shortness of breath -recently hospitalized and treated 8/2-8/7 for Covid pneumonia with IV steroids, IV remdesivir. -Chest imaging: On admission showed worsening of bilateral infiltrates.  -Treatment : She was given a repeat 5-day course of IV remdesivir, completed on 8/20. She completed 5-day course of antibiotics because of elevated procalcitonin. She remains on baricitinib since 8/17, a maximum of 14 days Currently on Solu-Medrol IV 40 mg twice daily.   -Oxygen - SpO2: 93 % O2  Flow Rate (L/min): 50 L/min FiO2 (%): 90 %   Progression: Patient is not improving. Her inflammatory markers have improved but she still requiring high flow oxygen.  Seen together with critical care.  Patient was intubated this morning. -Critical care team to take over. -WBC and inflammatory markers trend as below.  Recent Labs  Lab 05/31/20 0500 06/01/20 0349 06/02/20 0500 06/03/20 1043 06/04/20 0500  WBC 10.9*  --   --  10.3 10.7*  LATICACIDVEN  --   --   --   --  1.2  CRP 0.8 <0.5 0.6  --   --    The treatment plan and use of medications and known side effects were discussed with patient/family. Some of the medications used are based on case reports/anecdotal data.  All other medications being used in the management of COVID-19 based on limited study data.  Complete risks and long-term side effects are unknown, however in the best clinical judgment they seem to be of some benefit.  Patient wanted to proceed with treatment options provided.  Pneumomediastinum -Air trapped in the mediastinum secondary to high flow oxygen. -Critical care to follow.  Severe anxiety -Severe anxiety has been a reason of for inability to wean down oxygen. -Continue Klonopin and Xanax.    Paroxysmal atrial fibrillation: -Continue metoprolol and subcu Lovenox. If clinically improves, will switch back to oral Eliquis.  Elevated Troponin: -Elevated likely secondary to Covid. Low suspicion for ACS. Troponins are now normal.  -Echocardiogram shows normal EF with no wall motion abnormalities. Do not anticipate any further cardiac work-up at this time.  Normal anion gap metabolic acidosis  -Appears to have resolved. Continue to monitor.  Odynophagia Looks like she  was being treated empirically with fluconazole for candidal esophagitis.    CLL (chronic lymphocytic leukemia) Chemotherapy on hold. Continue bactrim and valganciclovir.  Nonischemic cardiomyopathy Chronic systolic heart failure    -Chart review showed EF of 35% back in 2019. Repeat echo showed improvement in EF to 50%.  -Continue metoprolol, Entresto. -Currently patient is clinically dry. BUN/creatinine ratio is significantly up at 63/0.96.  However has normal blood pressure and renal function.  Continue to monitor.  Hyperglycemia -No history of diabetes.  A1c 5.7.   -Blood sugar running high because of steroids.  Currently on Levemir 5 units twice daily as well as sliding scale insulin.   Mobility: Needs PT evaluation once oxygenation improves Code Status:   Code Status: Full Code  Nutritional status: Body mass index is 26.07 kg/m. Nutrition Problem: Increased nutrient needs Etiology: acute illness, catabolic illness Signs/Symptoms: estimated needs Diet Order    None      DVT prophylaxis: Lovenox subcu   Antimicrobials:  On Bactrim and valganciclovir per oncology Fluid: None  Consultants: None Family Communication:  I called and updated patient's husband about patient's condition and her wish to be intubated.  He said he would respect her wishes.  Status is: Inpatient  Remains inpatient appropriate because:Ongoing diagnostic testing needed not appropriate for outpatient work up and IV treatments appropriate due to intensity of illness or inability to take PO   Dispo:  Patient From: Home  Planned Disposition: To be determined  Expected discharge date: To be determined  medically stable for discharge: No   Infusions:  . sodium chloride    . ceFEPime (MAXIPIME) IV 2 g (06/04/20 1436)  . fentaNYL infusion INTRAVENOUS 50 mcg/hr (06/04/20 1100)  . phenylephrine (NEO-SYNEPHRINE) Adult infusion 60 mcg/min (06/04/20 1437)  . propofol (DIPRIVAN) infusion 5 mcg/kg/min (06/04/20 1124)  . [START ON 06/05/2020] vancomycin    . vancomycin      Scheduled Meds: . (feeding supplement) PROSource Plus  30 mL Oral BID BM  . baricitinib  4 mg Oral Daily  . chlorhexidine gluconate (MEDLINE KIT)  15 mL Mouth  Rinse BID  . Chlorhexidine Gluconate Cloth  6 each Topical Daily  . clonazePAM  0.5 mg Per Tube BID  . docusate  100 mg Per Tube BID  . enoxaparin (LOVENOX) injection  75 mg Subcutaneous Q12H  . feeding supplement (PROSource TF)  45 mL Per Tube BID  . feeding supplement (VITAL HIGH PROTEIN)  1,000 mL Per Tube Q24H  . fentaNYL (SUBLIMAZE) injection  50 mcg Intravenous Once  . furosemide  40 mg Intravenous Once  . insulin aspart  0-9 Units Subcutaneous TID WC  . insulin detemir  5 Units Subcutaneous BID  . mouth rinse  15 mL Mouth Rinse 10 times per day  . methylPREDNISolone (SOLU-MEDROL) injection  40 mg Intravenous Q12H  . pantoprazole (PROTONIX) IV  40 mg Intravenous Q12H  . polyethylene glycol  17 g Per Tube Daily  . sodium chloride flush  10-40 mL Intracatheter Q12H  . sulfamethoxazole-trimethoprim  1 tablet Oral Daily  . valGANciclovir  900 mg Oral Daily    Antimicrobials: Anti-infectives (From admission, onward)   Start     Dose/Rate Route Frequency Ordered Stop   06/05/20 0600  vancomycin (VANCOCIN) IVPB 1000 mg/200 mL premix        1,000 mg 200 mL/hr over 60 Minutes Intravenous Every 12 hours 06/04/20 1220     06/04/20 1330  vancomycin (VANCOREADY) IVPB 1500 mg/300 mL  1,500 mg 150 mL/hr over 120 Minutes Intravenous  Once 06/04/20 1220     06/04/20 1300  ceFEPIme (MAXIPIME) 2 g in sodium chloride 0.9 % 100 mL IVPB        2 g 200 mL/hr over 30 Minutes Intravenous Every 8 hours 06/04/20 1220     05/21/20 1300  sulfamethoxazole-trimethoprim (BACTRIM) 400-80 MG per tablet 1 tablet        1 tablet Oral Daily 05/21/20 1147     05/21/20 1000  remdesivir 100 mg in sodium chloride 0.9 % 100 mL IVPB       "Followed by" Linked Group Details   100 mg 200 mL/hr over 30 Minutes Intravenous Daily 05/12/2020 1333 05/24/20 1039   05/21/20 0900  ceFEPIme (MAXIPIME) 2 g in sodium chloride 0.9 % 100 mL IVPB        2 g 200 mL/hr over 30 Minutes Intravenous Every 8 hours 05/21/20 0832  05/26/20 0922   05/16/2020 2200  valGANciclovir (VALCYTE) 450 MG tablet TABS 900 mg        900 mg Oral Daily 05/17/2020 1421     05/28/2020 2100  vancomycin (VANCOCIN) IVPB 1000 mg/200 mL premix  Status:  Discontinued        1,000 mg 200 mL/hr over 60 Minutes Intravenous Every 8 hours 05/31/2020 1239 05/21/20 1055   05/15/2020 1430  fluconazole (DIFLUCAN) tablet 150 mg        150 mg Oral Daily 05/09/2020 1421 05/26/20 2359   05/10/2020 1430  cefTRIAXone (ROCEPHIN) 2 g in sodium chloride 0.9 % 100 mL IVPB  Status:  Discontinued        2 g 200 mL/hr over 30 Minutes Intravenous Every 24 hours 05/31/2020 1421 05/21/20 0815   05/06/2020 1400  azithromycin (ZITHROMAX) 500 mg in sodium chloride 0.9 % 250 mL IVPB        500 mg 250 mL/hr over 60 Minutes Intravenous Every 24 hours 05/15/2020 1337 05/24/20 1515   06/03/2020 1333  remdesivir 200 mg in sodium chloride 0.9% 250 mL IVPB       "Followed by" Linked Group Details   200 mg 580 mL/hr over 30 Minutes Intravenous Once 06/01/2020 1333 05/12/2020 1614   05/13/2020 1245  vancomycin (VANCOREADY) IVPB 1500 mg/300 mL        1,500 mg 150 mL/hr over 120 Minutes Intravenous  Once 06/05/2020 1239 06/02/2020 1812   05/23/2020 1215  ceFEPIme (MAXIPIME) 2 g in sodium chloride 0.9 % 100 mL IVPB        2 g 200 mL/hr over 30 Minutes Intravenous  Once 05/12/2020 1213 05/10/2020 1350      PRN meds: sodium chloride, acetaminophen, albuterol, fentaNYL, lip balm, [DISCONTINUED] ondansetron **OR** ondansetron (ZOFRAN) IV, sodium chloride, sodium chloride flush   Objective: Vitals:   06/04/20 1207 06/04/20 1535  BP:  (!) 142/51  Pulse:  (!) 57  Resp:  (!) 33  Temp:    SpO2: 92% 93%    Intake/Output Summary (Last 24 hours) at 06/04/2020 1548 Last data filed at 06/04/2020 0945 Gross per 24 hour  Intake 2144.74 ml  Output 551 ml  Net 1593.74 ml   Filed Weights   06/02/20 0446 06/03/20 0500 06/04/20 0500  Weight: 74.8 kg 74.7 kg 75.5 kg   Weight change: 0.8 kg Body mass index is 26.07  kg/m.   Physical Exam: General exam: Anxious, shallow breathing.  Tachypneic skin: No rashes, lesions or ulcers. HEENT: Atraumatic, normocephalic, supple neck, no obvious bleeding Lungs: Tachypneic.  Clear  to auscultation bilaterally CVS: Regular rate and rhythm, no murmur GI/Abd soft, nontender, nondistended, bowel sound present CNS: Half awake.  Able to follow commands, falls back asleep quick Psychiatry: Anxious, tachypneic Extremities: No pedal edema, no calf tenderness  Data Review: I have personally reviewed the laboratory data and studies available.  Recent Labs  Lab 05/31/20 0500 06/03/20 1043 06/04/20 0500  WBC 10.9* 10.3 10.7*  NEUTROABS  --   --  2.0  HGB 11.4* 10.2* 9.5*  HCT 36.2 32.6* 30.3*  MCV 89.6 90.8 91.5  PLT 141* 108* 98*   Recent Labs  Lab 05/30/20 0600 05/31/20 0500 06/01/20 0349 06/02/20 0500 06/04/20 0500  NA 139 140 141 138 139  K 4.3 4.3 4.8 4.5 4.7  CL 103 104 107 106 112*  CO2 '22 22 23 23 ' 20*  GLUCOSE 105* 139* 137* 118* 104*  BUN 61* 72* 68* 63* 32*  CREATININE 0.96 1.16* 0.96 0.96 0.64  CALCIUM 9.1 9.0 9.0 9.0 8.4*  MG  --   --   --   --  2.4  PHOS  --   --   --   --  2.4*   Lab Results  Component Value Date   HGBA1C 5.7 (H) 05/11/2020       Component Value Date/Time   CHOL 226 (H) 08/31/2019 0000   TRIG 139 05/10/2020 1110   HDL 65 08/31/2019 0000   CHOLHDL 5.5 07/16/2017 0103   VLDL 20 07/16/2017 0103   LDLCALC 140 (H) 08/31/2019 0000   LDLDIRECT 144.5 07/26/2013 0934   LABVLDL 21 08/31/2019 0000   Signed, Terrilee Croak, MD [facility] Pager: [phone] (Secure Chat preferred). 06/04/2020

## 2020-06-04 NOTE — Progress Notes (Signed)
Patient found in room without heated HFNC and without NRBM. Patient not responding to name, sternal rubbed and received minimal response. Patient placed back on oxygen (increased from 25L 60% to 55L and 70%) and MD made aware. Pulse ox placed back on reading mid 60%. RRT called to bedside. Patient started to respond shortly after and was able to state name and date of birth. Patient sating low 80%. MD updated. Will continue to monitor.

## 2020-06-04 NOTE — Procedures (Signed)
Intubation Procedure Note  Audrey Gross  [id number]  Dec 18, 1960  Date:06/04/20  Time:4:11 PM   Provider Performing:Pete E Kary Kos    Procedure: Intubation (11941)  Indication(s) Respiratory Failure  Consent Risks of the procedure as well as the alternatives and risks of each were explained to the patient and/or caregiver.  Consent for the procedure was obtained and is signed in the bedside chart   Anesthesia Etomidate, Versed, Fentanyl and Rocuronium   Time Out Verified patient identification, verified procedure, site/side was marked, verified correct patient position, special equipment/implants available, medications/allergies/relevant history reviewed, required imaging and test results available.   Sterile Technique Usual hand hygeine, masks, and gloves were used   Procedure Description Patient positioned in bed supine.  Sedation given as noted above.  Patient was intubated with endotracheal tube using Glidescope.  View was Grade 3 only epiglottis .  Number of attempts was 1.  Colorimetric CO2 detector was consistent with tracheal placement.   Complications/Tolerance None; patient tolerated the procedure well. Chest X-ray is ordered to verify placement.   EBL Minimal   Specimen(s) None  Erick Colace ACNP-BC [place] Pager # [phone] OR # [phone] if no answer

## 2020-06-04 NOTE — Progress Notes (Signed)
Pharmacy Antibiotic Note  Audrey Gross is a 59 y.o. female with CCL on chemo, recent COVID-19, admitted on 05/21/2020 with respiratory distress requiring intubation.  Pharmacy has been consulted for vancomycin and cefepime dosing for empiric PNA coverage.  Plan: Vancomycin 1500mg  IV x 1, then 1g IV q12h Check vancomycin levels at steady state Cefepime 2g IV q8h Follow up renal function & cultures  Height: 5\' 7"  (170.2 cm) Weight: 75.5 kg (166 lb 7.2 oz) IBW/kg (Calculated) : 61.6  Temp (24hrs), Avg:97.2 F (36.2 C), Min:94 F (34.4 C), Max:98 F (36.7 C)  Recent Labs  Lab 05/30/20 0600 05/31/20 0500 06/01/20 0349 06/02/20 0500 06/03/20 1043 06/04/20 0500  WBC  --  10.9*  --   --  10.3 10.7*  CREATININE 0.96 1.16* 0.96 0.96  --  0.64  LATICACIDVEN  --   --   --   --   --  1.2    Estimated Creatinine Clearance: 80.3 mL/min (by C-G formula based on SCr of 0.64 mg/dL).    No Known Allergies  Antimicrobials this admission:  8/2 remdesivir>>8/6 8/15 remdesivir>>8/19 (second course)  8/15 cefepime x1, 8/16 cefepime >> 8/21 8/15 vanc>>8/16 8/15 CTX>>8/16 8/15 azithro>> 8/19 8/15 Diflucan >> 8/21 (Fluconazole/Bactrim/Valcyte ppx.)  Dose adjustments this admission:   Microbiology results:  8/2 COVID+ 8/15 BCx x2: ngf 8/16 MRSA PCR: neg  Thank you for allowing pharmacy to be a part of this patient's care.  Peggyann Juba, PharmD, BCPS Pharmacy: [phone] 06/04/2020 12:20 PM

## 2020-06-04 NOTE — Progress Notes (Signed)
PT Cancellation Note  Patient Details Name: Audrey Gross MRN: [id number] DOB: 1961/05/06   Cancelled Treatment:    Reason Eval/Treat Not Completed: Medical issues which prohibited therapy, respiratory issues ongoing.    [name] 06/04/2020, 9:51 AM [place] Pager [phone] Office [phone]

## 2020-06-04 NOTE — Progress Notes (Signed)
NAME:  Audrey Gross, MRN:  [id number], DOB:  [birth year], LOS: 17 ADMISSION DATE:  06/05/2020, CONSULTATION DATE:  8/17 REFERRING MD:  Dr Florene Glen, CHIEF COMPLAINT:  COVID pneumonia   Brief History   59 year old female with CLL on chemotherapy who developed COVID symptoms on 7/30. Was admitted through 8/7 and discharged to home, but was re-admitted 8/15 with worsening respiratory distress. She remained dyspneic despite 40L HFNC and PCCM was consulted 8/17.  Past Medical History   has a past medical history of Atrial fibrillation ([place]), Cystic breast, Depression, Diffuse cystic mastopathy, Dyslipidemia (high LDL; low HDL), Hypertension, Iron deficiency anemia, unspecified, Lymphadenopathy of head and neck (04/05/2015), Medical non-compliance, Mitral regurgitation (02/2017), and Persistent atrial fibrillation with rapid ventricular response ([place]) (07/15/2017).   Significant Hospital Events   8/1 >8/5 admit for COVID 19 8/15 re-admitted with worsening hypoxia 8/17 PCCM consult.  Course from 8/17 to 8/30: elevated trop I-->these normalized. NAGMA resolved.  She was given a repeat 5-day course of IV remdesivir, completed on 8/20. She completed 5-day course of antibiotics because of elevated procalcitonin. Treated for esophagitis. She remains on baricitinib since 8/17, a maximum of 14 days Currently on Solu-Medrol IV 40 mg twice daily. On 8/30 developed sig worsening resp failure. RR 60s. sats 60s. Marked work of breathing. New lucency mediastinum. Intubated. ABX resumed.  Consults:  PCCM  Procedures:  oett 8/30>>>  Significant Diagnostic Tests:    Micro Data:  resp culture 8/30>>>  Antimicrobials:  Vancomycin 8/15 > 8/17 Azithromycin 8/15> Cefepime 8/15 > Bactrim 8/15 > 8/30 vanc>>> Cefepime 8/30>>>  Interim history/subjective:  Now intubated.   Objective   Blood pressure 139/83, pulse (Abnormal) 109, temperature (Abnormal) 97.5 F (36.4 C), temperature source Axillary, resp. rate  (Abnormal) 36, height 5\' 7"  (1.702 m), weight 75.5 kg, last menstrual period 02/03/2017, SpO2 (Abnormal) 82 %.    FiO2 (%):  [60 %-80 %] 80 %   Intake/Output Summary (Last 24 hours) at 06/04/2020 0827 Last data filed at 06/04/2020 0600 Gross per 24 hour  Intake 1862.65 ml  Output 1051 ml  Net 811.65 ml   Filed Weights   06/02/20 0446 06/03/20 0500 06/04/20 0500  Weight: 74.8 kg 74.7 kg 75.5 kg    Physical Exam: General: This is a 59 year old black female she is currently sedated but prior to this was in acute respiratory distress in spite of attempted none invasive ventilation HEENT normocephalic atraumatic mucous membranes are moist neck veins flat now orally intubated Pulmonary: dec bases Currently on full ventilatory support PEEP 8/FiO2 100% Plateau pressure currently 27 Cardiac tachycardic rhythm no murmur rub or gallop Abdomen soft nontender no organomegaly Extremities are warm dry with brisk capillary refill Neuro awake very anxious prior to intubation now heavily sedated with RASS of -4 GU clear yellow  Resolved Hospital Problem list     Assessment & Plan:   Acute hypoxemic respiratory failure secondary to COVID-19 pneumonia, ARDS, and 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 No w ett in place Plan ARDS protocol, starting at 6 cc/kg predicted body weight Excepting hypercarbia Plateau pressure goal less than 30 driving pressure goal less than 15 PAD protocol RASS goal -3 to -4 currently we may need to add neuromuscular blockade depending on ventilator compliance IV Lasix as blood pressure, BUN and creatinine allow Continue systemic steroids VAP  bundle Will start empiric abx given clinical decline   Mild pneumomediastinum  Plan Trend cxr Try to limit PEEP   Drug related Hypotension    Plan Neo gtt for MAP > 65  Anxiety Plan Cont clonazepam  PAD protocol   Paroxysmal Atrial fibrillation Plan Tele   Hyperglycemia Plan ssi   H/o CLL Plan Holding chemo Cont bactrim and valganciclovir   Best practice:  Diet: regular Pain/Anxiety/Delirium protocol (if indicated): NA VAP protocol (if indicated): NA DVT prophylaxis: Eliquis (AF) GI prophylaxis: NA Glucose control: Per primary Mobility: BR Code Status: FULL Family Communication: patient updated bedside Disposition: Remain in ICU  My cct 40 minutes  Erick Colace ACNP-BC [place] Pager # [phone] OR # [phone] if no answer

## 2020-06-04 NOTE — TOC Progression Note (Addendum)
Transition of Care [facility]) - Progression Note    Patient Details  Name: EUSTOLIA DRENNEN MRN: [id number] Date of Birth: 1961-03-13  [facility] CM/SW Contact  Leeroy Cha, RN Phone Number: 06/04/2020, 11:13 AM  Clinical Narrative:    Intubated this am due to increased dyspena,iv solumedrol, fentanyl,propofol. Following for progression.   Expected Discharge Plan: Home/Self Care Barriers to Discharge: Continued Medical Work up  Expected Discharge Plan and Services Expected Discharge Plan: Home/Self Care   Discharge Planning Services: CM Consult   Living arrangements for the past 2 months: Single Family Home                                       Social Determinants of Health (SDOH) Interventions    Readmission Risk Interventions No flowsheet data found.

## 2020-06-05 ENCOUNTER — Inpatient Hospital Stay (HOSPITAL_COMMUNITY): Payer: BC Managed Care – PPO

## 2020-06-05 LAB — COMPREHENSIVE METABOLIC PANEL
ALT: 83 U/L — ABNORMAL HIGH (ref 0–44)
AST: 56 U/L — ABNORMAL HIGH (ref 15–41)
Albumin: 2.7 g/dL — ABNORMAL LOW (ref 3.5–5.0)
Alkaline Phosphatase: 58 U/L (ref 38–126)
Anion gap: 10 (ref 5–15)
BUN: 41 mg/dL — ABNORMAL HIGH (ref 6–20)
CO2: 18 mmol/L — ABNORMAL LOW (ref 22–32)
Calcium: 8.3 mg/dL — ABNORMAL LOW (ref 8.9–10.3)
Chloride: 112 mmol/L — ABNORMAL HIGH (ref 98–111)
Creatinine, Ser: 1.09 mg/dL — ABNORMAL HIGH (ref 0.44–1.00)
GFR calc Af Amer: >60 mL/min (ref >60)
GFR calc non Af Amer: 56 mL/min — ABNORMAL LOW (ref >60)
Glucose, Bld: 173 mg/dL — ABNORMAL HIGH (ref 70–99)
Potassium: 4.6 mmol/L (ref 3.5–5.1)
Sodium: 140 mmol/L (ref 135–145)
Total Bilirubin: 0.8 mg/dL (ref 0.3–1.2)
Total Protein: 5.2 g/dL — ABNORMAL LOW (ref 6.5–8.1)

## 2020-06-05 LAB — BLOOD GAS, ARTERIAL
Acid-base deficit: 9.3 mmol/L — ABNORMAL HIGH (ref 0.0–2.0)
Acid-base deficit: 8.6 mmol/L — ABNORMAL HIGH (ref 0.0–2.0)
Bicarbonate: 20.1 mmol/L (ref 20.0–28.0)
Bicarbonate: 19.6 mmol/L — ABNORMAL LOW (ref 20.0–28.0)
Drawn by: 441261
FIO2: 50
MECHVT: 310 mL
O2 Saturation: 84.2 %
O2 Saturation: 92.7 %
PEEP: 8 cmH2O
Patient temperature: 36.7
Patient temperature: 37
RATE: 35 resp/min
pCO2 arterial: 65.1 mmHg (ref 32.0–48.0)
pCO2 arterial: 57.7 mmHg — ABNORMAL HIGH (ref 32.0–48.0)
pH, Arterial: 7.115 — CL (ref 7.350–7.450)
pH, Arterial: 7.158 — CL (ref 7.350–7.450)
pO2, Arterial: 64 mmHg — ABNORMAL LOW (ref 83.0–108.0)
pO2, Arterial: 78 mmHg — ABNORMAL LOW (ref 83.0–108.0)

## 2020-06-05 LAB — GLUCOSE, CAPILLARY
Glucose-Capillary: 162 mg/dL — ABNORMAL HIGH (ref 70–99)
Glucose-Capillary: 174 mg/dL — ABNORMAL HIGH (ref 70–99)
Glucose-Capillary: 151 mg/dL — ABNORMAL HIGH (ref 70–99)
Glucose-Capillary: 224 mg/dL — ABNORMAL HIGH (ref 70–99)
Glucose-Capillary: 204 mg/dL — ABNORMAL HIGH (ref 70–99)

## 2020-06-05 LAB — BASIC METABOLIC PANEL
Anion gap: 9 (ref 5–15)
BUN: 39 mg/dL — ABNORMAL HIGH (ref 6–20)
CO2: 21 mmol/L — ABNORMAL LOW (ref 22–32)
Calcium: 8.3 mg/dL — ABNORMAL LOW (ref 8.9–10.3)
Chloride: 112 mmol/L — ABNORMAL HIGH (ref 98–111)
Creatinine, Ser: 1.1 mg/dL — ABNORMAL HIGH (ref 0.44–1.00)
GFR calc Af Amer: >60 mL/min (ref >60)
GFR calc non Af Amer: 55 mL/min — ABNORMAL LOW (ref >60)
Glucose, Bld: 181 mg/dL — ABNORMAL HIGH (ref 70–99)
Potassium: 4.1 mmol/L (ref 3.5–5.1)
Sodium: 142 mmol/L (ref 135–145)

## 2020-06-05 LAB — MAGNESIUM
Magnesium: 2.1 mg/dL (ref 1.7–2.4)
Magnesium: 2 mg/dL (ref 1.7–2.4)
Magnesium: 2 mg/dL (ref 1.7–2.4)

## 2020-06-05 LAB — PHOSPHORUS
Phosphorus: 4.5 mg/dL (ref 2.5–4.6)
Phosphorus: 4.9 mg/dL — ABNORMAL HIGH (ref 2.5–4.6)

## 2020-06-05 LAB — TRIGLYCERIDES: Triglycerides: 342 mg/dL — ABNORMAL HIGH (ref <150)

## 2020-06-05 MED ORDER — FUROSEMIDE 10 MG/ML IJ SOLN
40.0000 mg | Freq: Once | INTRAMUSCULAR | Status: AC
  Administered 2020-06-05: 40 mg via INTRAVENOUS
  Filled 2020-06-05: qty 4

## 2020-06-05 MED ORDER — VECURONIUM BROMIDE 10 MG IV SOLR
0.1000 mg/kg | INTRAVENOUS | Status: DC | PRN
  Administered 2020-06-05: 6.7 mg via INTRAVENOUS
  Filled 2020-06-05 (×3): qty 10

## 2020-06-05 MED ORDER — FREE WATER
100.0000 mL | Freq: Three times a day (TID) | Status: DC
  Administered 2020-06-05 – 2020-06-13 (×23): 100 mL

## 2020-06-05 MED ORDER — VECURONIUM BROMIDE 10 MG IV SOLR
INTRAVENOUS | Status: AC
  Filled 2020-06-05: qty 10

## 2020-06-05 MED ORDER — VITAL HIGH PROTEIN PO LIQD
1000.0000 mL | ORAL | Status: AC
  Administered 2020-06-06 – 2020-06-12 (×10): 1000 mL

## 2020-06-05 MED ORDER — VECURONIUM BROMIDE 10 MG IV SOLR
0.1000 mg/kg | INTRAVENOUS | Status: DC | PRN
  Administered 2020-06-06: 6.7 mg via INTRAVENOUS

## 2020-06-05 MED ORDER — SODIUM BICARBONATE 8.4 % IV SOLN
INTRAVENOUS | Status: DC
  Filled 2020-06-05 (×2): qty 850
  Filled 2020-06-05 (×3): qty 150

## 2020-06-05 MED ORDER — VECURONIUM BROMIDE 10 MG IV SOLR
0.1000 mg/kg | INTRAVENOUS | Status: DC | PRN
  Administered 2020-06-05 (×3): 6.7 mg via INTRAVENOUS

## 2020-06-05 MED ORDER — VECURONIUM BROMIDE 10 MG IV SOLR
INTRAVENOUS | Status: AC
  Administered 2020-06-05: 6.7 mg via INTRAVENOUS
  Filled 2020-06-05: qty 10

## 2020-06-05 MED ORDER — ARTIFICIAL TEARS OPHTHALMIC OINT
1.0000 "application " | TOPICAL_OINTMENT | Freq: Three times a day (TID) | OPHTHALMIC | Status: DC
  Administered 2020-06-05 – 2020-06-10 (×13): 1 via OPHTHALMIC
  Filled 2020-06-05: qty 3.5

## 2020-06-05 NOTE — Progress Notes (Signed)
Nutrition Follow-up  DOCUMENTATION CODES:   Not applicable  INTERVENTION:  - will adjust TF regimen: Vital High Protein @ 60 ml/hr with 100 ml free water TID. - at goal rate, this regimen + kcal from current propofol rate will provide 2039 kcal, 126 grams protein, and 1504 ml free water.  - Monitor magnesium, potassium, and phosphorus daily for at least 3 days, MD to replete as needed, as pt is at risk for refeeding syndrome given poor PO intakes x2 weeks.    NUTRITION DIAGNOSIS:   Increased nutrient needs related to  (COVID-19 infection) as evidenced by estimated needs. -ongoing  GOAL:   Patient will meet greater than or equal to 90% of their needs -to be met with TF regimen  MONITOR:   Vent status, TF tolerance, Labs, Weight trends  REASON FOR ASSESSMENT:   Ventilator, Consult Enteral/tube feeding initiation and management  ASSESSMENT:   59 y.o. 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 She was discharged from the hospital on 8/7 after treatment for COVID-19. She has not been vacinnated. Patient presented to the ED with SOB, fever, and ongoing productive cough.  Patient was intubated yesterday at ~1610 and small bore NGT placed in L nare (gastric). Patient is currently receiving TF per protocol: Vital High Protein @ 40 ml/hr with 45 ml Prosource TF BID. This regimen is providing 1040 kcal, 106 grams protein, and 802 ml free water.   Prior to intubation, she was eating 0-50% of meals x1 week. Weight significantly down since admission on 8/15. Used today's weight of 66.8 kg to re-estimate nutrition needs, but will continue to monitor weight trends.  Patient was discussed in rounds this AM. She was tolerating TF at that time. Patient being proned by staff at this time; will complete NFPE at follow-up.     Patient is currently intubated on ventilator support MV: 12.2  L/min Temp (24hrs), Avg:98 F (36.7 C), Min:97.3 F (36.3 C), Max:98.6 F (37 C) Propofol: 22.7 ml/hr (599 kcal)  Labs reviewed; CBGs: 162, 174, 151 mg/dl, Cl: 112 mmol/l, BUN: 41 mg/dl, creatinine: 1.09 mg/dl, Ca: 8.3 mg/dl, LFTs elevated. Medications reviewed; 100 mg colace BID, 40 mg IV lasix x1 dose 8/31, sliding scale novolog, 5 units levemir BID, 40 mg solu-medrol BID, 40 mg IV protonix BID, 17 g miralax/day. Drips; fentanyl @ 350 mcg/hr, propofol @ 50 mcg/kg/min.     NUTRITION - FOCUSED PHYSICAL EXAM:  unable to complete at this time.   Diet Order:   Diet Order    None      EDUCATION NEEDS:   No education needs have been identified at this time  Skin:  Skin Assessment: Reviewed RN Assessment  Last BM:  8/30  Height:   Ht Readings from Last 1 Encounters:  06/04/20 '5\' 7"'  (1.702 m)    Weight:   Wt Readings from Last 1 Encounters:  06/05/20 66.8 kg    Estimated Nutritional Needs:  Kcal:  2005 kcal Protein:  113-134 grams (1.7-2 grams/kg) Fluid:  >/= 2.1 L/day     Jarome Matin, MS, RD, LDN, CNSC Inpatient Clinical Dietitian RD pager # available in [facility]  After hours/weekend pager # available in [facility]

## 2020-06-05 NOTE — Progress Notes (Signed)
CRITICAL VALUE ALERT  Critical Value: pH 7.158  Date & Time Notied: 06/05/2020 1543  Provider Notified: Yes   Orders Received/Actions taken: Going to start a bicarb drip

## 2020-06-05 NOTE — Progress Notes (Signed)
Pts. head repositioned along with RN from L to R facing along with L arm up/R arm down, pt. tolerated well with no complications, E.T. tube tape remains intact/dried.

## 2020-06-05 NOTE — Progress Notes (Signed)
PT Cancellation Note  Patient Details Name: Audrey Gross MRN: [id number] DOB: 10-06-1961   Cancelled Treatment:    Reason Eval/Treat Not Completed: Medical issues which prohibited therapy, sedated and intubated. Will follow along.   [name] 06/05/2020, 6:49 AM  [place] Pager [phone] Office [phone]

## 2020-06-05 NOTE — Progress Notes (Signed)
Patient proned @ 1300. ABG due 2 hours post-prone - 1500.

## 2020-06-05 NOTE — Progress Notes (Signed)
CRITICAL VALUE ALERT  Critical Value: pH 7.115, PCO2 65.1  Date & Time Notied: 06/05/2020 1020  Provider Notified: Yes  Orders Received/Actions taken: Pt proned

## 2020-06-05 NOTE — Progress Notes (Signed)
[facility] Progress Note Patient Name: PRISCA GEARING DOB: 1961-09-21 MRN: [id number]   Date of Service  06/05/2020  HPI/Events of Note  Urinary retention with > 999 ml of fluid in the bladder on bladder scanning.  eICU Interventions  Foley  Catheter ordered.        Kerry Kass Israel Werts 06/05/2020, 6:03 AM

## 2020-06-05 NOTE — Progress Notes (Signed)
At 1239 today this patient went into SVT. The rate got up to 248 and blood pressure dropped to 59/42. Nurse Practitioner Laurey Arrow made aware. Was about to cardiovert when patient flipped back into sinus rhythm. Blood pressure went back up to 139/57. STAT basic metabolic panel and Magnesium were collected after. Will continue to monitor.

## 2020-06-05 NOTE — Progress Notes (Signed)
ANTICOAGULATION CONSULT NOTE - Follow Up Consult  Pharmacy Consult for lovenox Indication: hx atrial fibrillation (homeEliquis on hold)  No Known Allergies  Patient Measurements: Height: 5\' 7"  (170.2 cm) Weight: 66.8 kg (147 lb 4.3 oz) IBW/kg (Calculated) : 61.6 Heparin Dosing Weight:   Vital Signs: Temp: 97.3 F (36.3 C) (08/31 0700) Temp Source: Axillary (08/31 0402) BP: 95/45 (08/31 0700) Pulse Rate: 77 (08/31 0700)  Labs: Recent Labs    06/03/20 1043 06/04/20 0500  HGB 10.2* 9.5*  HCT 32.6* 30.3*  PLT 108* 98*  CREATININE  --  0.64    Estimated Creatinine Clearance: 73.6 mL/min (by C-G formula based on SCr of 0.64 mg/dL).   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  Pharmacy was consulted on 8/18 to transition patient to LMWH.  - 8/16 LE Dopplers: negative for DVT  Today, 06/05/2020: - cbc on 8/30: hgb down slightly to 9.5; plts down 98 - no bleeding documented - scr 0.64 (crcl~73) - pt was intubated on 8/30  Goal of Therapy:  Anti-Xa level 0.6-1 units/ml 4hrs after LMWH dose given Monitor platelets by anticoagulation protocol: Yes   Plan:  - continue lovenox 75 mg SQ q12h for now - cbc q72 hrs - monitor cbc and  for s/s bleeding  Aila Terra P 06/05/2020,8:10 AM

## 2020-06-05 NOTE — Progress Notes (Signed)
[facility] Progress Note Patient Name: Audrey Gross DOB: [birth year] MRN: [id number]   Date of Service  06/05/2020  HPI/Events of Note  Patient had a brief episode of narrow complex tachycardia with rate up to 170 with spontaneous  Termination, no hemodynamic compromise, It's not clear what the rhythm was.  eICU Interventions  Plan is to review electrolytes and monitor patient closely overnight.        Frederik Pear 06/05/2020, 9:58 PM

## 2020-06-05 NOTE — Progress Notes (Addendum)
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 Was admitted through 8/7 and discharged to home, but was re-admitted 8/15 with worsening respiratory distress. She remained dyspneic despite 40L HFNC and PCCM was consulted 8/17.  Past Medical History   has a past medical history of Atrial fibrillation ([place]), Cystic breast, Depression, Diffuse cystic mastopathy, Dyslipidemia (high LDL; low HDL), Hypertension, Iron deficiency anemia, unspecified, Lymphadenopathy of head and neck (04/05/2015), Medical non-compliance, Mitral regurgitation (02/2017), and Persistent atrial fibrillation with rapid ventricular response ([place]) (07/15/2017).   Significant Hospital Events   8/1 >8/5 admit for COVID 19 8/15 re-admitted with worsening hypoxia 8/17 PCCM consult.  Course from 8/17 to 8/30: elevated trop I-->these normalized. NAGMA resolved.  She was given a repeat 5-day course of IV remdesivir, completed on 8/20. She completed 5-day course of antibiotics because of elevated procalcitonin. Treated for esophagitis. She remains on baricitinib since 8/17, a maximum of 14 days Currently on Solu-Medrol IV 40 mg twice daily. On 8/30 developed sig worsening resp failure. RR 60s. sats 60s. Marked work of breathing. New lucency mediastinum. Intubated. ABX resumed.  IV Lasix 8/31: Very poor lung compliance, but tidal volume reduced to 5 cc/kg predicted body weight.  Had to go back to 6cc/kg d/t sig hypercarbia. Stable pneumomediastinum.  Diffuse bilateral airspace disease persists.  Pulse oximetry remaining above 85% on PEEP 8/FiO2 50%.  Requiring as needed neuromuscular blockade Consults:  PCCM  Procedures:  oett 8/30>>>  Significant Diagnostic Tests:    Micro Data:  resp culture  8/30>>>  Antimicrobials:  Vancomycin 8/15 > 8/17 Azithromycin 8/15> Cefepime 8/15 > Bactrim 8/15 > 8/30 vanc>>> Cefepime 8/30>>>  Interim history/subjective:  Sedated  Objective   Blood pressure (Abnormal) 108/50, pulse 94, temperature 98.1 F (36.7 C), resp. rate (Abnormal) 35, height 5\' 7"  (1.702 m), weight 66.8 kg, last menstrual period 02/03/2017, SpO2 (Abnormal) 87 %.    Vent Mode: PRVC FiO2 (%):  [50 %-100 %] 50 % Set Rate:  [30 bmp-35 bmp] 35 bmp Vt Set:  [310 mL-370 mL] 310 mL PEEP:  [8 cmH20] 8 cmH20 Plateau Pressure:  [22 cmH20-32 cmH20] 32 cmH20   Intake/Output Summary (Last 24 hours) at 06/05/2020 0935 Last data filed at 06/05/2020 0756 Gross per 24 hour  Intake 3593.47 ml  Output 1000 ml  Net 2593.47 ml   Filed Weights   06/03/20 0500 06/04/20 0500 06/05/20 0453  Weight: 74.7 kg 75.5 kg 66.8 kg    Physical Exam: General: 59 year old black female currently heavily sedated on mechanical ventilator HEENT normocephalic atraumatic no jugular venous distention appreciated mucous membranes moist sclera nonicteric Pulmonary: Diminished bilaterally with some scattered rhonchi.  Currently on 50% FiO2, PEEP 8.  Plateau pressure 30, with current driving pressure at 18.  This is on 5 mL/kg predicted body weight tidal volume Cardiac mildly tachycardic regular rhythm Abdomen soft nontender tolerating tube feeds GU clear yellow urine Neuro: Currently sedated but appears comfortable.  Had no focal deficits previously Extremities warm dry  Resolved Hospital Problem list   Drug-related hypotension Assessment & Plan:   Acute hypoxemic respiratory failure secondary to COVID-19 pneumonia, ARDS, and likely severe fibrotic disease at this point +/- element of volume excess Portable chest x-ray personally reviewed endotracheal tube in IV port in  satisfactory position.  Bilateral diffuse air space disease persists, stable pneumomediastinum without any significant increase in  volume She has significantly reduced ventilator compliance Plan Continue ARDS protocol, have tried to reduced tidal volume to 5 cc/kg predicted body weight But became significantly hypercarbic w/ ph down 7.11 Goal plateau pressure less than 30, driving pressure less than 15 if able PAD protocol RASS goal - 4 IV Lasix As needed neuromuscular blockade for ventilator synchrony Repeating arterial blood gas, if P/F ratio less than 150 will initiate proning Currently excepting saturations greater than 85% Continue current systemic steroid dosing Day #2 cefepime and vancomycin, awaiting cultures A.m. chest x-ray VAP bundle  Mild pneumomediastinum  Plan Trying to limit PEEP As needed chest x-ray  Anxiety, now with acute metabolic encephalopathy Plan Continuing clonazepam scheduled Continuing fentanyl and propofol drip, however triglycerides are climbing, will continue propofol for now however if they increase again in the morning on 9/1 we will need to transition to Versed  Paroxysmal Atrial fibrillation Plan Continue telemetry monitoring  Hyperglycemia Plan Sliding scale insulin goal glucose 1 40-1 80  H/o CLL Plan Holding chemo, continuing bactrim and valganciclovir  F/u w/ ONC  Best practice:  Diet: regular Pain/Anxiety/Delirium protocol (if indicated): NA VAP protocol (if indicated): NA DVT prophylaxis: Eliquis (AF) GI prophylaxis: NA Glucose control: Per primary Mobility: BR Code Status: FULL Family Communication: patient updated bedside Disposition: Remain in ICU My critical care time is 35 minutes  Erick Colace ACNP-BC [place] Pager # [phone] OR # [phone] if no answer

## 2020-06-06 ENCOUNTER — Inpatient Hospital Stay (HOSPITAL_COMMUNITY): Payer: BC Managed Care – PPO

## 2020-06-06 DIAGNOSIS — R0902 Hypoxemia: Secondary | ICD-10-CM

## 2020-06-06 LAB — GLUCOSE, CAPILLARY
Glucose-Capillary: 157 mg/dL — ABNORMAL HIGH (ref 70–99)
Glucose-Capillary: 168 mg/dL — ABNORMAL HIGH (ref 70–99)
Glucose-Capillary: 179 mg/dL — ABNORMAL HIGH (ref 70–99)
Glucose-Capillary: 131 mg/dL — ABNORMAL HIGH (ref 70–99)

## 2020-06-06 LAB — COMPREHENSIVE METABOLIC PANEL
ALT: 132 U/L — ABNORMAL HIGH (ref 0–44)
AST: 77 U/L — ABNORMAL HIGH (ref 15–41)
Albumin: 2.3 g/dL — ABNORMAL LOW (ref 3.5–5.0)
Alkaline Phosphatase: 53 U/L (ref 38–126)
Anion gap: 9 (ref 5–15)
BUN: 41 mg/dL — ABNORMAL HIGH (ref 6–20)
CO2: 24 mmol/L (ref 22–32)
Calcium: 8.1 mg/dL — ABNORMAL LOW (ref 8.9–10.3)
Chloride: 109 mmol/L (ref 98–111)
Creatinine, Ser: 0.99 mg/dL (ref 0.44–1.00)
GFR calc Af Amer: >60 mL/min (ref >60)
GFR calc non Af Amer: >60 mL/min (ref >60)
Glucose, Bld: 189 mg/dL — ABNORMAL HIGH (ref 70–99)
Potassium: 3.9 mmol/L (ref 3.5–5.1)
Sodium: 142 mmol/L (ref 135–145)
Total Bilirubin: 0.5 mg/dL (ref 0.3–1.2)
Total Protein: 5.1 g/dL — ABNORMAL LOW (ref 6.5–8.1)

## 2020-06-06 LAB — CBC
HCT: 30.2 % — ABNORMAL LOW (ref 36.0–46.0)
Hemoglobin: 9 g/dL — ABNORMAL LOW (ref 12.0–15.0)
MCH: 28.9 pg (ref 26.0–34.0)
MCHC: 29.8 g/dL — ABNORMAL LOW (ref 30.0–36.0)
MCV: 97.1 fL (ref 80.0–100.0)
Platelets: 73 10*3/uL — ABNORMAL LOW (ref 150–400)
RBC: 3.11 MIL/uL — ABNORMAL LOW (ref 3.87–5.11)
RDW: 15 % (ref 11.5–15.5)
WBC: 6.5 10*3/uL (ref 4.0–10.5)
nRBC: 0 % (ref 0.0–0.2)

## 2020-06-06 LAB — TRIGLYCERIDES: Triglycerides: 252 mg/dL — ABNORMAL HIGH (ref <150)

## 2020-06-06 LAB — BLOOD GAS, ARTERIAL
Acid-base deficit: 2 mmol/L (ref 0.0–2.0)
Bicarbonate: 25.6 mmol/L (ref 20.0–28.0)
Drawn by: 29503
FIO2: 50
Hi Frequency JET Vent Rate: 8
MECHVT: 370 mL
O2 Saturation: 91.5 %
Patient temperature: 98.6
RATE: 30 resp/min
pCO2 arterial: 63.9 mmHg — ABNORMAL HIGH (ref 32.0–48.0)
pH, Arterial: 7.226 — ABNORMAL LOW (ref 7.350–7.450)
pO2, Arterial: 65 mmHg — ABNORMAL LOW (ref 83.0–108.0)

## 2020-06-06 MED ORDER — METOPROLOL TARTRATE 25 MG/10 ML ORAL SUSPENSION
12.5000 mg | Freq: Two times a day (BID) | ORAL | Status: DC
  Administered 2020-06-06: 12.5 mg via ORAL
  Filled 2020-06-06: qty 5

## 2020-06-06 MED ORDER — VECURONIUM BROMIDE 10 MG IV SOLR
INTRAVENOUS | Status: AC
  Administered 2020-06-06: 6.7 mg via INTRAVENOUS
  Filled 2020-06-06: qty 10

## 2020-06-06 MED ORDER — SULFAMETHOXAZOLE-TRIMETHOPRIM 200-40 MG/5ML PO SUSP
10.0000 mL | Freq: Every day | ORAL | Status: DC
  Administered 2020-06-06 – 2020-06-10 (×5): 10 mL
  Filled 2020-06-06 (×7): qty 10

## 2020-06-06 MED ORDER — METOPROLOL TARTRATE 25 MG/10 ML ORAL SUSPENSION
12.5000 mg | Freq: Two times a day (BID) | ORAL | Status: DC
  Administered 2020-06-06 – 2020-06-12 (×10): 12.5 mg
  Filled 2020-06-06 (×14): qty 5

## 2020-06-06 MED ORDER — VECURONIUM BROMIDE 10 MG IV SOLR
INTRAVENOUS | Status: AC
  Filled 2020-06-06: qty 10

## 2020-06-06 MED ORDER — PANTOPRAZOLE SODIUM 40 MG PO PACK
40.0000 mg | PACK | Freq: Two times a day (BID) | ORAL | Status: DC
  Administered 2020-06-06 – 2020-06-13 (×14): 40 mg
  Filled 2020-06-06 (×14): qty 20

## 2020-06-06 MED ORDER — POTASSIUM CHLORIDE 20 MEQ/15ML (10%) PO SOLN
40.0000 meq | Freq: Once | ORAL | Status: AC
  Administered 2020-06-06: 40 meq
  Filled 2020-06-06: qty 30

## 2020-06-06 MED ORDER — ENOXAPARIN SODIUM 80 MG/0.8ML ~~LOC~~ SOLN
1.0000 mg/kg | Freq: Two times a day (BID) | SUBCUTANEOUS | Status: DC
  Administered 2020-06-06 – 2020-06-08 (×6): 65 mg via SUBCUTANEOUS
  Filled 2020-06-06 (×7): qty 0.65

## 2020-06-06 MED ORDER — AMIODARONE LOAD VIA INFUSION
150.0000 mg | Freq: Once | INTRAVENOUS | Status: AC
  Administered 2020-06-06: 150 mg via INTRAVENOUS
  Filled 2020-06-06: qty 83.34

## 2020-06-06 MED ORDER — AMIODARONE HCL IN DEXTROSE 360-4.14 MG/200ML-% IV SOLN
30.0000 mg/h | INTRAVENOUS | Status: DC
  Administered 2020-06-06 – 2020-06-11 (×10): 30 mg/h via INTRAVENOUS
  Filled 2020-06-06: qty 200
  Filled 2020-06-06: qty 400
  Filled 2020-06-06 (×8): qty 200

## 2020-06-06 MED ORDER — FUROSEMIDE 10 MG/ML IJ SOLN
40.0000 mg | Freq: Once | INTRAMUSCULAR | Status: AC
  Administered 2020-06-06: 40 mg via INTRAVENOUS
  Filled 2020-06-06: qty 4

## 2020-06-06 MED ORDER — AMIODARONE IV BOLUS ONLY 150 MG/100ML
INTRAVENOUS | Status: AC
  Administered 2020-06-06: 150 mg
  Filled 2020-06-06: qty 100

## 2020-06-06 MED ORDER — VECURONIUM BOLUS VIA INFUSION
0.0800 mg/kg | Freq: Once | INTRAVENOUS | Status: AC
  Administered 2020-06-06: 5.3 mg via INTRAVENOUS
  Filled 2020-06-06: qty 6

## 2020-06-06 MED ORDER — PROPOFOL 1000 MG/100ML IV EMUL
25.0000 ug/kg/min | INTRAVENOUS | Status: DC
  Administered 2020-06-06 (×2): 35 ug/kg/min via INTRAVENOUS
  Administered 2020-06-07: 70 ug/kg/min via INTRAVENOUS
  Administered 2020-06-07: 65 ug/kg/min via INTRAVENOUS
  Administered 2020-06-07 (×2): 70 ug/kg/min via INTRAVENOUS
  Administered 2020-06-07: 65 ug/kg/min via INTRAVENOUS
  Administered 2020-06-08: 60 ug/kg/min via INTRAVENOUS
  Administered 2020-06-08: 70 ug/kg/min via INTRAVENOUS
  Administered 2020-06-08: 60 ug/kg/min via INTRAVENOUS
  Administered 2020-06-08: 70 ug/kg/min via INTRAVENOUS
  Administered 2020-06-08: 60 ug/kg/min via INTRAVENOUS
  Administered 2020-06-08: 70 ug/kg/min via INTRAVENOUS
  Administered 2020-06-08: 60 ug/kg/min via INTRAVENOUS
  Administered 2020-06-09 (×2): 50 ug/kg/min via INTRAVENOUS
  Administered 2020-06-09: 60 ug/kg/min via INTRAVENOUS
  Administered 2020-06-09 (×3): 50 ug/kg/min via INTRAVENOUS
  Administered 2020-06-10 (×3): 45 ug/kg/min via INTRAVENOUS
  Administered 2020-06-10: 60 ug/kg/min via INTRAVENOUS
  Administered 2020-06-10 – 2020-06-11 (×2): 50 ug/kg/min via INTRAVENOUS
  Administered 2020-06-11: 45.018 ug/kg/min via INTRAVENOUS
  Administered 2020-06-11 (×4): 45 ug/kg/min via INTRAVENOUS
  Administered 2020-06-12: 55 ug/kg/min via INTRAVENOUS
  Administered 2020-06-12 (×4): 45 ug/kg/min via INTRAVENOUS
  Filled 2020-06-06 (×39): qty 100

## 2020-06-06 MED ORDER — ARTIFICIAL TEARS OPHTHALMIC OINT
1.0000 "application " | TOPICAL_OINTMENT | Freq: Three times a day (TID) | OPHTHALMIC | Status: DC
  Administered 2020-06-08 – 2020-06-13 (×14): 1 via OPHTHALMIC
  Filled 2020-06-06: qty 3.5

## 2020-06-06 MED ORDER — AMIODARONE HCL IN DEXTROSE 360-4.14 MG/200ML-% IV SOLN
60.0000 mg/h | INTRAVENOUS | Status: AC
  Administered 2020-06-06 (×2): 60 mg/h via INTRAVENOUS
  Filled 2020-06-06 (×2): qty 200

## 2020-06-06 MED ORDER — CLONAZEPAM 1 MG PO TABS
1.0000 mg | ORAL_TABLET | Freq: Two times a day (BID) | ORAL | Status: DC
  Administered 2020-06-06 – 2020-06-07 (×3): 1 mg
  Filled 2020-06-06 (×3): qty 1

## 2020-06-06 MED ORDER — VECURONIUM BROMIDE 10 MG IV SOLR
0.0000 ug/kg/min | INTRAVENOUS | Status: DC
  Administered 2020-06-06: 1 ug/kg/min via INTRAVENOUS
  Administered 2020-06-07: 0.7 ug/kg/min via INTRAVENOUS
  Filled 2020-06-06 (×2): qty 100

## 2020-06-06 NOTE — Progress Notes (Signed)
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 Was admitted through 8/7 and discharged to home, but was re-admitted 8/15 with worsening respiratory distress. She remained dyspneic despite 40L HFNC and PCCM was consulted 8/17.  Past Medical History   has a past medical history of Atrial fibrillation ([place]), Cystic breast, Depression, Diffuse cystic mastopathy, Dyslipidemia (high LDL; low HDL), Hypertension, Iron deficiency anemia, unspecified, Lymphadenopathy of head and neck (04/05/2015), Medical non-compliance, Mitral regurgitation (02/2017), and Persistent atrial fibrillation with rapid ventricular response ([place]) (07/15/2017).   Significant Hospital Events   8/1 >8/5 admit for COVID 19 8/15 re-admitted with worsening hypoxia 8/17 PCCM consult.  Course from 8/17 to 8/30: elevated trop I-->these normalized. NAGMA resolved.  She was given a repeat 5-day course of IV remdesivir, completed on 8/20. She completed 5-day course of antibiotics because of elevated procalcitonin. Treated for esophagitis. She remains on baricitinib since 8/17, a maximum of 14 days Currently on Solu-Medrol IV 40 mg twice daily. On 8/30 developed sig worsening resp failure. RR 60s. sats 60s. Marked work of breathing. New lucency mediastinum. Intubated. ABX resumed.  IV Lasix 8/31: Very poor lung compliance, but tidal volume reduced to 5 cc/kg predicted body weight.  Had to go back to 6cc/kg d/t sig hypercarbia. Stable pneumomediastinum.  Diffuse bilateral airspace disease persists.  Pulse oximetry remaining above 85% on PEEP 8/FiO2 50%.  Requiring as needed neuromuscular blockade; attempting prone positioning. Added bicarb gtt in afternoon to compensate for respiratory acidosis and keep Ph > 7.2 Had  episode of SVT w/ HR 248. Converted on own just as cardioversion pads being put in place.  9/1: two more episodes of narrow complex tachycardia over night. Back in supine position.  Consults:  PCCM  Procedures:  oett 8/30>>>  Significant Diagnostic Tests:    Micro Data:  resp culture 8/30>>>  Antimicrobials:  Vancomycin 8/15 > 8/17 Azithromycin 8/15> off Cefepime 8/15 > off Bactrim 8/15 > 8/30 vanc>>> Cefepime 8/30>>>  Interim history/subjective:  Heavily sedated  Objective   Blood pressure (Abnormal) 112/55, pulse 98, temperature 98 F (36.7 C), temperature source Rectal, resp. rate (Abnormal) 30, height 5\' 7"  (1.702 m), weight 65.9 kg, last menstrual period 02/03/2017, SpO2 97 %.    Vent Mode: PRVC FiO2 (%):  [50 %-60 %] 60 % Set Rate:  [30 bmp-35 bmp] 30 bmp Vt Set:  [310 mL-370 mL] 370 mL PEEP:  [8 cmH20] 8 cmH20 Plateau Pressure:  [24 cmH20-36 cmH20] 24 cmH20   Intake/Output Summary (Last 24 hours) at 06/06/2020 0810 Last data filed at 06/06/2020 0654 Gross per 24 hour  Intake 2747.15 ml  Output 3955 ml  Net -1207.85 ml   Filed Weights   06/04/20 0500 06/05/20 0453 06/06/20 0500  Weight: 75.5 kg 66.8 kg 65.9 kg    Physical Exam: General 59 year old black female currently on full ventilatory support she is heavily sedated currently HEENT normocephalic atraumatic sclera nonicteric no JVD mucous membranes moist orally intubated.  Does have palpable subcutaneous air tracking up primarily the right anterior lateral neck this is increased in comparison to exam on 8/31 Pulmonary: Diminished bilaterally.  Currently on 6 cc/kg predicted body weight mechanical ventilation, plateau pressure 34, driving pressureIs 26.  This is on FiO2 of 50% and PEEP of  8 Cardiac: Regular rate and rhythm without murmur rub or gallop Abdomen soft nontender no organomegaly Extremities are warm and dry with brisk capillary refill strong pulses Neuro heavily sedated currently GU clear yellow  urine  Resolved Hospital Problem list   Drug-related hypotension Assessment & Plan:   Acute hypoxemic respiratory failure secondary to COVID-19 pneumonia, ARDS, and likely severe fibrotic disease at this point +/- element of volume excess pcxr personally reviewed: ett and port in good position. Worsening pneumomediastinum w/ [place] air. L>R airspace disease persists.  Arterial blood gas reviewed currently acceptable Plan Cont ards protocol  Cont 34ml/kg PBW ventilation. Unfortunately can't go below this as limited by respiratory acidosis  PAD protocol RASS goal -4 Ph goal > 7.2 (using bicarb gtt to compensate)-->get ABG now  PPlat goal < 30/driving pressure goal < 15 -->have not been able to achieve this IV lasix as BUN/cr and BP allow  PRN NMB  Cont prone protocol as P/F ratio <150 Day 3 cefepime and vanc as we await cultures.  Cont current steroid dosing  Repeat cxr later this am; then again in am   Mild pneumomediastinum -->looks worse  Plan Limiting PEEP Repeating cxr in am May need bilateral chest tubes if gets much worse   Anxiety, now with acute metabolic encephalopathy Plan Clonazepam scheduled Cont fent gtt and propofol gtt  Am triglycerides  Paroxysmal Atrial fibrillation-->and symptomatic SVT Plan PRN lopressor Cont tele Ensure K > 4 and Mg > 2 Cardioversion if needed  Hyperglycemia Plan Cont ssi and basal dosing   H/o CLL Plan Holding chemo Cont bactrim and valganciclovir F/u with onc if survives  Anemia of critical illness Plan Trend cbc  Best practice:  Diet: regular Pain/Anxiety/Delirium protocol (if indicated): NA VAP protocol (if indicated): NA DVT prophylaxis: Eliquis (AF) GI prophylaxis: NA Glucose control: Per primary Mobility: BR Code Status: FULL Family Communication: patient updated bedside Disposition: Remain in ICU My critical care time is  34 minutes   Erick Colace ACNP-BC [place] Pager # [phone]  OR # [phone] if no answer

## 2020-06-06 NOTE — Progress Notes (Signed)
Pt turned back to supine without incident.  Pt tolerated well.  ETT remains secure at 21cm lip.

## 2020-06-06 NOTE — TOC Progression Note (Signed)
Transition of Care [facility]) - Progression Note    Patient Details  Name: Audrey Gross MRN: [id number] Date of Birth: 01/15/1961  [facility] CM/SW Contact  Leeroy Cha, RN Phone Number: 06/06/2020, 9:55 AM  Clinical Narrative:    Continues on the vent, iv solu medrol, iv merrem, iv nahco3 drip. Being proned. Following for rpogression and toc needs/possible ltach candidate.  Expected Discharge Plan: Home/Self Care Barriers to Discharge: Continued Medical Work up  Expected Discharge Plan and Services Expected Discharge Plan: Home/Self Care   Discharge Planning Services: CM Consult   Living arrangements for the past 2 months: Single Family Home                                       Social Determinants of Health (SDOH) Interventions    Readmission Risk Interventions No flowsheet data found.

## 2020-06-06 NOTE — Progress Notes (Signed)
Patient went into SVT. HR to 170s. MD made aware and to bedside. New orders received. Will continue to monitor.

## 2020-06-06 NOTE — Progress Notes (Signed)
Updated patients spouse about patient's instability  Heart rate irregularity Pneumomediastinum with the risk of decompensation leading to chest tube requirement  Addition of paralytics  Overall prognosis is worsening  He is not able to make any decisions regarding treatment escalation/limitation  I did make him aware that her overall prognosis is very poor and she is unlikely to recover from ongoing illness  We will continue ongoing care at present

## 2020-06-06 NOTE — Progress Notes (Signed)
OT Cancellation Note  Patient Details Name: Audrey Gross MRN: [id number] DOB: 09-Jun-1961   Cancelled Treatment:    Reason Eval/Treat Not Completed: Medical issues which prohibited therapy, patient intubated and sedated. Will discontinue acute OT services at this time, please re-order when able to participate. Thank you.  Delbert Phenix OT OT pager: [phone]  Rosemary Holms 06/06/2020, 2:04 PM

## 2020-06-06 NOTE — Progress Notes (Signed)
[place] Progress Note Patient Name: Audrey Gross DOB: October 23, 1960 MRN: [id number]   Date of Service  06/06/2020  HPI/Events of Note  Patient with episodic narrow complex tachycardia in response to stimulation, sinus tachycardia vs atrial tachycardia.  eICU Interventions  Lopressor 12.5 mg po bid ordered.        Kerry Kass Luciana Cammarata 06/06/2020, 5:17 AM

## 2020-06-06 NOTE — Progress Notes (Signed)
Patient went into SVT again. HR 160s. MD made aware. New orders received. Will continue to monitor.

## 2020-06-06 DEATH — deceased

## 2020-06-07 ENCOUNTER — Inpatient Hospital Stay (HOSPITAL_COMMUNITY): Payer: BC Managed Care – PPO

## 2020-06-07 LAB — BLOOD GAS, ARTERIAL
Acid-Base Excess: 6.3 mmol/L — ABNORMAL HIGH (ref 0.0–2.0)
Bicarbonate: 31.9 mmol/L — ABNORMAL HIGH (ref 20.0–28.0)
Drawn by: 295031
FIO2: 70
MECHVT: 370 mL
O2 Saturation: 95.4 %
PEEP: 8 cmH2O
Patient temperature: 98.6
RATE: 32 resp/min
pCO2 arterial: 56.8 mmHg — ABNORMAL HIGH (ref 32.0–48.0)
pH, Arterial: 7.368 (ref 7.350–7.450)
pO2, Arterial: 77.8 mmHg — ABNORMAL LOW (ref 83.0–108.0)

## 2020-06-07 LAB — CULTURE, RESPIRATORY W GRAM STAIN: Culture: NORMAL

## 2020-06-07 LAB — COMPREHENSIVE METABOLIC PANEL
ALT: 167 U/L — ABNORMAL HIGH (ref 0–44)
AST: 91 U/L — ABNORMAL HIGH (ref 15–41)
Albumin: 2 g/dL — ABNORMAL LOW (ref 3.5–5.0)
Alkaline Phosphatase: 49 U/L (ref 38–126)
Anion gap: 9 (ref 5–15)
BUN: 47 mg/dL — ABNORMAL HIGH (ref 6–20)
CO2: 28 mmol/L (ref 22–32)
Calcium: 7.8 mg/dL — ABNORMAL LOW (ref 8.9–10.3)
Chloride: 100 mmol/L (ref 98–111)
Creatinine, Ser: 0.95 mg/dL (ref 0.44–1.00)
GFR calc Af Amer: >60 mL/min (ref >60)
GFR calc non Af Amer: >60 mL/min (ref >60)
Glucose, Bld: 172 mg/dL — ABNORMAL HIGH (ref 70–99)
Potassium: 4.2 mmol/L (ref 3.5–5.1)
Sodium: 137 mmol/L (ref 135–145)
Total Bilirubin: 0.6 mg/dL (ref 0.3–1.2)
Total Protein: 4.7 g/dL — ABNORMAL LOW (ref 6.5–8.1)

## 2020-06-07 LAB — GLUCOSE, CAPILLARY
Glucose-Capillary: 124 mg/dL — ABNORMAL HIGH (ref 70–99)
Glucose-Capillary: 105 mg/dL — ABNORMAL HIGH (ref 70–99)
Glucose-Capillary: 142 mg/dL — ABNORMAL HIGH (ref 70–99)
Glucose-Capillary: 168 mg/dL — ABNORMAL HIGH (ref 70–99)

## 2020-06-07 LAB — TRIGLYCERIDES: Triglycerides: 265 mg/dL — ABNORMAL HIGH (ref <150)

## 2020-06-07 MED ORDER — LACTATED RINGERS IV BOLUS
250.0000 mL | Freq: Once | INTRAVENOUS | Status: AC
  Administered 2020-06-07: 250 mL via INTRAVENOUS

## 2020-06-07 MED ORDER — FUROSEMIDE 10 MG/ML IJ SOLN
40.0000 mg | Freq: Once | INTRAMUSCULAR | Status: AC
  Administered 2020-06-07: 40 mg via INTRAVENOUS
  Filled 2020-06-07: qty 4

## 2020-06-07 MED ORDER — LIDOCAINE HCL URETHRAL/MUCOSAL 2 % EX GEL
1.0000 "application " | Freq: Once | CUTANEOUS | Status: DC
  Filled 2020-06-07: qty 5

## 2020-06-07 MED ORDER — POTASSIUM CHLORIDE 20 MEQ/15ML (10%) PO SOLN
40.0000 meq | Freq: Once | ORAL | Status: AC
  Administered 2020-06-07: 40 meq
  Filled 2020-06-07: qty 30

## 2020-06-07 NOTE — Progress Notes (Signed)
Physical Therapy Discharge Patient Details Name: Audrey Gross MRN: [id number] DOB: 1961-05-23 Today's Date: 06/07/2020 Time:  -     Patient discharged from PT services secondary to medical decline  Please see latest therapy progress note for current level of functioning and progress toward goals.     GP     [name] 06/07/2020, 6:46 AM [place] Pager [phone] Office [phone]

## 2020-06-07 NOTE — Progress Notes (Signed)
Pt proned at 2145 with 3 RN's and RT at bedside, no complications.

## 2020-06-07 NOTE — Progress Notes (Signed)
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 Was admitted through 8/7 and discharged to home, but was re-admitted 8/15 with worsening respiratory distress. She remained dyspneic despite 40L HFNC and PCCM was consulted 8/17.  Past Medical History   has a past medical history of Atrial fibrillation ([place]), Cystic breast, Depression, Diffuse cystic mastopathy, Dyslipidemia (high LDL; low HDL), Hypertension, Iron deficiency anemia, unspecified, Lymphadenopathy of head and neck (04/05/2015), Medical non-compliance, Mitral regurgitation (02/2017), and Persistent atrial fibrillation with rapid ventricular response ([place]) (07/15/2017).   Significant Hospital Events   8/1 >8/5 admit for COVID 19 8/15 re-admitted with worsening hypoxia 8/17 PCCM consult.  Course from 8/17 to 8/30: elevated trop I-->these normalized. NAGMA resolved.  She was given a repeat 5-day course of IV remdesivir, completed on 8/20. She completed 5-day course of antibiotics because of elevated procalcitonin. Treated for esophagitis. She remains on baricitinib since 8/17, a maximum of 14 days Currently on Solu-Medrol IV 40 mg twice daily. On 8/30 developed sig worsening resp failure. RR 60s. sats 60s. Marked work of breathing. New lucency mediastinum. Intubated. ABX resumed.  IV Lasix 8/31: Very poor lung compliance, but tidal volume reduced to 5 cc/kg predicted body weight.  Had to go back to 6cc/kg d/t sig hypercarbia. Stable pneumomediastinum.  Diffuse bilateral airspace disease persists.  Pulse oximetry remaining above 85% on PEEP 8/FiO2 50%.  Requiring as needed neuromuscular blockade; attempting prone positioning. Added bicarb gtt in afternoon to compensate for respiratory acidosis and keep Ph > 7.2 Had  episode of SVT w/ HR 248. Converted on own just as cardioversion pads being put in place.  9/1: two more episodes of narrow complex tachycardia over night. Back in supine position.  Has moderate to large pneumomediastinum this was evaluated by CT scan.  There was no pneumothorax.  Tidal volume changed to 5 cc/kg predicted body weight with limit PEEP again at 8, trying to decrease risk of pneumomediastinum worsening.  Also added neuromuscular blockade.  Was placed on amiodarone infusion for recurrent SVT.  [name] 9/2: Not much change, currently in the prone position.  Stopping vancomycin as MRSA cultures all negative Consults:  PCCM  Procedures:  oett 8/30>>> Right internal jugular vein triple-lumen catheter placed 9/1 Significant Diagnostic Tests:    Micro Data:  resp culture 8/30>>>  Antimicrobials:  Vancomycin 8/15 > 8/17 Azithromycin 8/15> off Cefepime 8/15 > off Bactrim 8/15 > 8/30 vanc>>> 9/2 Cefepime 8/30>>>  Interim history/subjective:  Now heavily sedated and paralyzed, now in prone position.  Objective   Blood pressure (Abnormal) 123/48, pulse 80, temperature 99.7 F (37.6 C), temperature source Bladder, resp. rate (Abnormal) 32, height 5\' 7"  (1.702 m), weight 65.9 kg, last menstrual period 02/03/2017, SpO2 94 %.    Vent Mode: PRVC FiO2 (%):  [50 %-100 %] 70 % Set Rate:  [30 bmp-32 bmp] 32 bmp Vt Set:  [370 mL] 370 mL PEEP:  [8 cmH20] 8 cmH20 Plateau Pressure:  [28 cmH20-33 cmH20] 29 cmH20   Intake/Output Summary (Last 24 hours) at 06/07/2020 0848 Last data filed at 06/07/2020 0800 Gross per 24 hour  Intake 3278.32 ml  Output 2125 ml  Net 1153.32 ml   Filed Weights   06/04/20 0500 06/05/20 0453  06/06/20 0500  Weight: 75.5 kg 66.8 kg 65.9 kg    Physical Exam: General this is a 59 year old black female she is currently heavily sedated, also on neuromuscular blockade infusion and in the prone position  HEENT normocephalic  atraumatic orally intubated some facial edema today  Pulmonary: Some scattered rhonchi equal chest rise bilaterally.  Current tidal volume 5 cc/kg predicted body weight, PEEP limited at 8, FiO2 70%.  Current plateau pressure 29, And driving pressure 21 Cardiac regular rate and rhythm  Abdomen soft nontender tolerating tube feeds  Extremities dependent edema pulses palpable  Neuro heavily sedated and on neuromuscular blockade  GU clear yellow urine    Resolved Hospital Problem list   Drug-related hypotension Assessment & Plan:   Acute hypoxemic respiratory failure secondary to COVID-19 pneumonia, ARDS, and likely severe fibrotic disease at this point +/- element of volume excess Portable chest x-ray personally reviewed tubes and lines in satisfactory position, continues to have significant pneumomediastinum, diffuse bilateral airspace disease persists.  Really no significant change when comparing most previous film Currently pulse oximetry 94% Plan Continuing low tidal volume ventilation  PAD protocol RASS goal negative 4 Continue heavy sedation and neuromuscular blockade  pH goal greater than 7.2, will continue bicarbonate to compensate for decreased minute ventilation  Plateau pressure goal less than 30, driving pressure goal less than 15  Continue daily assessment for diuresis as long as blood pressure, BUN and creatinine allow  Continue proning protocol as long as P/F ratio less than 150  Day #4 cefepime and vancomycin, no MRSA identified so we will discontinue vancomycin  No change in current steroids  Repeat chest x-ray and arterial blood gas later today    Large pneumomediastinum -->looks about the same when comparing exam on 9/1 Plan Limit PEEP and tidal volume Continue to monitor chest x-ray, if develops pneumothorax will need chest tube   Anxiety, now with acute metabolic encephalopathy Plan Currently on high dose sedating drips we will continue clonazepam via tube as  well Repeating a.m. triglycerides  Paroxysmal Atrial fibrillation-->and symptomatic SVT Plan Continue amiodarone Ensure potassium greater than 4 and magnesium greater than 2 Continue telemetry monitoring   Hyperglycemia Plan Continue sliding scale insulin and current basal dosing  H/o CLL Plan Holding chemo  Continuing Bactrim and valganciclovir  Will need to follow-up with oncology if she survives this however I am increasingly concerned that she will not   Anemia of critical illness Plan Continue to trend CBC Transfuse for hemoglobin less than 7    Best practice:  Diet: regular Pain/Anxiety/Delirium protocol (if indicated): NA VAP protocol (if indicated): NA DVT prophylaxis: Eliquis (AF) GI prophylaxis: NA Glucose control: Per primary Mobility: BR Code Status: FULL Family Communication: patient updated bedside Disposition: Remain in ICU My critical care time is   Critical care consult 34 minutes   Erick Colace ACNP-BC [place] Pager # [phone] OR # [phone] if no answer

## 2020-06-07 NOTE — Progress Notes (Signed)
Patient went into A. Fib RVR around 12pm. Patient on amiodarone drip at the time. MD made aware, new orders received for 280mL LR bolus. Around 1230pm, patient converted back to NSR. Shortly after patient began having ectopy. She then became bradycardic (HR 40s), new verbal orders received to stop amiodarone. Orders carried out. Will continue to monitor.

## 2020-06-07 NOTE — Progress Notes (Signed)
Patient's husband called to update on patient's status. Due to nature of disease progression and low chance of survival, patient's husband and daughter are allowed to visit patient. MD and department's assistant director aware.

## 2020-06-07 NOTE — Progress Notes (Signed)
Pharmacy Antibiotic Note  Audrey Gross is a 59 y.o. female with CCL on chemo, recent COVID-19, admitted on 06/05/2020 with respiratory distress requiring intubation.  Pharmacy has been consulted for vancomycin and cefepime dosing for empiric PNA coverage.  06/07/2020  Remains intubated sedated, now paralyzed w/ vecuronium WBC WNL SCr WNL Tm 100.4  Plan: Continue Cefepime 2g IV q8h Vancomycin stopped by CCM today Follow up renal function & cultures  Height: 5\' 7"  (170.2 cm) Weight: 65.9 kg (145 lb 4.5 oz) IBW/kg (Calculated) : 61.6  Temp (24hrs), Avg:98.6 F (37 C), Min:96.4 F (35.8 C), Max:100.4 F (38 C)  Recent Labs  Lab 06/02/20 0500 06/03/20 1043 06/04/20 0500 06/05/20 0443 06/05/20 1330 06/06/20 0418 06/07/20 0243  WBC  --  10.3 10.7*  --   --  6.5  --   CREATININE   < >  --  0.64 1.09* 1.10* 0.99 0.95  LATICACIDVEN  --   --  1.2  --   --   --   --    < > = values in this interval not displayed.    Estimated Creatinine Clearance: 62 mL/min (by C-G formula based on SCr of 0.95 mg/dL).    No Known Allergies   Antimicrobials this admission:  8/2 remdesivir>>8/6 8/15 remdesivir>>8/19 (Dr. Venetia Constable wants repeat course despite being informed of no additional benefit and cost to pt is $9000) 8/15 cefepime x1, 8/16 cefepime >> 8/21,  8/30>> 8/15 vanc>>8/16  8/30>>9/2 8/15 CTX>>8/16 8/15 azithro>> 8/19 8/15 Diflucan >> 8/21 Bactrim/Valcyte ppx as PTA since on chemo for CLL>> Baricitinib started 8/17 - 8/29.  Dose adjustments this admission:   Microbiology results:  8/2 COVID+ 8/15 BCx x2: ngF 8/16 MRSA PCR: neg 8/30 Trach asp: normal flora F  Thank you for allowing pharmacy to be a part of this patient's care. Eudelia Bunch, Pharm.D 06/07/2020 9:15 AM

## 2020-06-07 NOTE — Progress Notes (Signed)
Patient placed in supine position. No complications. MD at the bedside.

## 2020-06-07 NOTE — Progress Notes (Signed)
Spoke w/ husband via phone.  Clinically not improved at all.  Oxygen requirements have not improve, ventilator requirements still high, has large pneumomediastinum.  This is not something this patient will survive.  I shared this with her husband.  He understands that should she suffer a cardiac arrest we will not provide ACLS.  I have suggested he and his family consider finding a way to say goodbye.  At this point the plan will be to continue aggressive care however we will not add or escalate care.  If she suffers a cardiac arrest we will not perform CPR Plan  continue current therapy as outlined, no further escalation DNR if arrests  Erick Colace ACNP-BC [place] Pager # [phone] OR # [phone] if no answer

## 2020-06-08 ENCOUNTER — Inpatient Hospital Stay (HOSPITAL_COMMUNITY): Payer: BC Managed Care – PPO

## 2020-06-08 DIAGNOSIS — R0902 Hypoxemia: Secondary | ICD-10-CM

## 2020-06-08 LAB — GLUCOSE, CAPILLARY
Glucose-Capillary: 147 mg/dL — ABNORMAL HIGH (ref 70–99)
Glucose-Capillary: 195 mg/dL — ABNORMAL HIGH (ref 70–99)
Glucose-Capillary: 161 mg/dL — ABNORMAL HIGH (ref 70–99)
Glucose-Capillary: 197 mg/dL — ABNORMAL HIGH (ref 70–99)
Glucose-Capillary: 165 mg/dL — ABNORMAL HIGH (ref 70–99)
Glucose-Capillary: 179 mg/dL — ABNORMAL HIGH (ref 70–99)

## 2020-06-08 LAB — BASIC METABOLIC PANEL
Anion gap: 12 (ref 5–15)
BUN: 51 mg/dL — ABNORMAL HIGH (ref 6–20)
CO2: 28 mmol/L (ref 22–32)
Calcium: 8.1 mg/dL — ABNORMAL LOW (ref 8.9–10.3)
Chloride: 101 mmol/L (ref 98–111)
Creatinine, Ser: 0.96 mg/dL (ref 0.44–1.00)
GFR calc Af Amer: >60 mL/min (ref >60)
GFR calc non Af Amer: >60 mL/min (ref >60)
Glucose, Bld: 194 mg/dL — ABNORMAL HIGH (ref 70–99)
Potassium: 4.5 mmol/L (ref 3.5–5.1)
Sodium: 141 mmol/L (ref 135–145)

## 2020-06-08 LAB — CBC
HCT: 26.4 % — ABNORMAL LOW (ref 36.0–46.0)
Hemoglobin: 8.2 g/dL — ABNORMAL LOW (ref 12.0–15.0)
MCH: 28.8 pg (ref 26.0–34.0)
MCHC: 31.1 g/dL (ref 30.0–36.0)
MCV: 92.6 fL (ref 80.0–100.0)
Platelets: 43 10*3/uL — ABNORMAL LOW (ref 150–400)
RBC: 2.85 MIL/uL — ABNORMAL LOW (ref 3.87–5.11)
RDW: 15.3 % (ref 11.5–15.5)
WBC: 5.2 10*3/uL (ref 4.0–10.5)
nRBC: 0 % (ref 0.0–0.2)

## 2020-06-08 LAB — TRIGLYCERIDES: Triglycerides: 325 mg/dL — ABNORMAL HIGH (ref <150)

## 2020-06-08 LAB — BLOOD GAS, ARTERIAL
Acid-Base Excess: 4.3 mmol/L — ABNORMAL HIGH (ref 0.0–2.0)
Bicarbonate: 30.5 mmol/L — ABNORMAL HIGH (ref 20.0–28.0)
FIO2: 70
O2 Saturation: 87.9 %
Patient temperature: 98.6
pCO2 arterial: 58.8 mmHg — ABNORMAL HIGH (ref 32.0–48.0)
pH, Arterial: 7.336 — ABNORMAL LOW (ref 7.350–7.450)
pO2, Arterial: 60 mmHg — ABNORMAL LOW (ref 83.0–108.0)

## 2020-06-08 MED ORDER — "THROMBI-PAD 3""X3"" EX PADS"
1.0000 | MEDICATED_PAD | Freq: Once | CUTANEOUS | Status: AC
  Administered 2020-06-08: 1 via TOPICAL
  Filled 2020-06-08: qty 1

## 2020-06-08 MED ORDER — HYDRALAZINE HCL 20 MG/ML IJ SOLN
10.0000 mg | INTRAMUSCULAR | Status: DC | PRN
  Administered 2020-06-08: 10 mg via INTRAVENOUS
  Filled 2020-06-08: qty 1

## 2020-06-08 MED ORDER — FUROSEMIDE 10 MG/ML IJ SOLN
40.0000 mg | Freq: Once | INTRAMUSCULAR | Status: AC
  Administered 2020-06-08: 40 mg via INTRAVENOUS
  Filled 2020-06-08: qty 4

## 2020-06-08 MED ORDER — CLONAZEPAM 1 MG PO TABS
2.0000 mg | ORAL_TABLET | Freq: Two times a day (BID) | ORAL | Status: DC
  Administered 2020-06-08 – 2020-06-09 (×3): 2 mg
  Filled 2020-06-08 (×3): qty 2

## 2020-06-08 MED ORDER — NICARDIPINE HCL IN NACL 20-0.86 MG/200ML-% IV SOLN
3.0000 mg/h | INTRAVENOUS | Status: DC
  Administered 2020-06-08: 5 mg/h via INTRAVENOUS
  Administered 2020-06-09: 10 mg/h via INTRAVENOUS
  Administered 2020-06-10 (×4): 5 mg/h via INTRAVENOUS
  Filled 2020-06-08 (×7): qty 200

## 2020-06-08 NOTE — TOC Progression Note (Signed)
Transition of Care [facility]) - Progression Note    Patient Details  Name: Audrey Gross MRN: [id number] Date of Birth: [birth year]  Transition of Care [facility]) CM/SW Contact  Leeroy Cha, RN Phone Number: 06/08/2020, 10:01 AM  Clinical Narrative:    Remains on the vent at 70%fi02 desats to 87% with exertion in bed, iv solu-medrol, iv aminodarone, iv maxipime, iv sedation. follwing for progression and toc needs possible ltach transition once stable.   Expected Discharge Plan: Home/Self Care Barriers to Discharge: Continued Medical Work up  Expected Discharge Plan and Services Expected Discharge Plan: Home/Self Care   Discharge Planning Services: CM Consult   Living arrangements for the past 2 months: Single Family Home                                       Social Determinants of Health (SDOH) Interventions    Readmission Risk Interventions No flowsheet data found.

## 2020-06-08 NOTE — Progress Notes (Signed)
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 Was admitted through 8/7 and discharged to home, but was re-admitted 8/15 with worsening respiratory distress. She remained dyspneic despite 40L HFNC and PCCM was consulted 8/17.  Past Medical History   has a past medical history of Atrial fibrillation ([place]), Cystic breast, Depression, Diffuse cystic mastopathy, Dyslipidemia (high LDL; low HDL), Hypertension, Iron deficiency anemia, unspecified, Lymphadenopathy of head and neck (04/05/2015), Medical non-compliance, Mitral regurgitation (02/2017), and Persistent atrial fibrillation with rapid ventricular response ([place]) (07/15/2017).   Significant Hospital Events   8/1 >8/5 admit for COVID 19 8/15 re-admitted with worsening hypoxia 8/17 PCCM consult.  Course from 8/17 to 8/30: elevated trop I-->these normalized. NAGMA resolved.  She was given a repeat 5-day course of IV remdesivir, completed on 8/20. She completed 5-day course of antibiotics because of elevated procalcitonin. Treated for esophagitis. She remains on baricitinib since 8/17, a maximum of 14 days Currently on Solu-Medrol IV 40 mg twice daily. On 8/30 developed sig worsening resp failure. RR 60s. sats 60s. Marked work of breathing. New lucency mediastinum. Intubated. ABX resumed.  IV Lasix 8/31: Very poor lung compliance, but tidal volume reduced to 5 cc/kg predicted body weight.  Had to go back to 6cc/kg d/t sig hypercarbia. Stable pneumomediastinum.  Diffuse bilateral airspace disease persists.  Pulse oximetry remaining above 85% on PEEP 8/FiO2 50%.  Requiring as needed neuromuscular blockade; attempting prone positioning. Added bicarb gtt in afternoon to compensate for respiratory acidosis and keep Ph > 7.2 Had  episode of SVT w/ HR 248. Converted on own just as cardioversion pads being put in place.  9/1: two more episodes of narrow complex tachycardia over night. Back in supine position.  Has moderate to large pneumomediastinum this was evaluated by CT scan.  There was no pneumothorax.  Tidal volume changed to 5 cc/kg predicted body weight with limit PEEP again at 8, trying to decrease risk of pneumomediastinum worsening.  Also added neuromuscular blockade.  Was placed on amiodarone infusion for recurrent SVT.  [name] 9/2: Not much change, currently in the prone position.  Stopping vancomycin as MRSA cultures all negative Made her DNR based on little to add if arrests 9/3 off pressors, pneumomediastinum a little better. Vent setting: ## Stopping abx as nothing grew in cultures.  Stopping neuromuscular blockade. Consults:  PCCM  Procedures:  oett 8/30>>> Right internal jugular vein triple-lumen catheter placed 9/1 Significant Diagnostic Tests:    Micro Data:  resp culture 8/30>>>neg  Antimicrobials:  Vancomycin 8/15 > 8/17 Azithromycin 8/15> off Cefepime 8/15 > off Bactrim 8/15 > 8/30 vanc>>> 9/2 Cefepime 8/30>>>9/3  Interim history/subjective:    Objective   Blood pressure (Abnormal) 178/65, pulse (Abnormal) 55, temperature 98.6 F (37 C), resp. rate (Abnormal) 32, height 5\' 7"  (1.702 m), weight 65.9 kg, last menstrual period 02/03/2017, SpO2 93 %.    Vent Mode: PRVC FiO2 (%):  [70 %] 70 % Set Rate:  [32 bmp] 32 bmp Vt Set:  [370 mL] 370 mL PEEP:  [8 cmH20] 8 cmH20 Plateau Pressure:  [29 cmH20-35 cmH20] 34 cmH20   Intake/Output Summary (Last 24 hours) at 06/08/2020 0750 Last data filed at 06/08/2020 0513 Gross per 24 hour  Intake 2770.53  ml  Output 2410 ml  Net 360.53 ml   Filed Weights   06/04/20 0500 06/05/20 0453 06/06/20 0500  Weight: 75.5 kg 66.8 kg 65.9 kg    Physical Exam: General this is a 59 year old black female she is  currently sedated, and also on neuromuscular blockade she remains on full ventilatory support HEENT: Orally intubated no JVD does have bloody discharge under the right IJ triple-lumen catheter dressing Pulmonary: Coarse bilateral, currently remains on full ventilator support.  PEEP 8, FiO2 70%, saturations on this are 93%, current plateau pressure 30, driving pressure remains above 15 Cardiac: Regular rate and rhythm has bursts of SVT and also intermittent bradycardia now Abdomen: Soft not tender no organomegaly positive bowel sounds Neuro: Heavily sedated and on neuromuscular blockade GU: Clear yellow urine  Resolved Hospital Problem list   Drug-related hypotension Assessment & Plan:   Acute hypoxemic respiratory failure secondary to COVID-19 pneumonia, ARDS, and likely severe fibrotic disease at this point +/- element of volume excess -abg reviewed and gas exchange acceptable.  PCXR: Still has diffuse bilateral airspace disease. Pneumomediastinum much improved.  -vent setting: Still on 70%/FiO2 Plan Cont full vent support w/ low Vt ventilation PAD protocol. RASS goal -4; but will see if we can stop NMB PH goal >7.2-->can reduce bicarb gtt PPlat goal <30/driving pressure goal < 15-->we have not been able to achieve these goals thus far Cont daily diuresis  Cont prone protocol as long as P/F ratio is < 150 Today is day 5 Cefepime, has not grown anything in cultures-->DC after today  No change in steroid dosing  Am cxr   Large pneumomediastinum -->has significantly improved.  Plan Limiting PEEP and VT Serial CXRs.   Anxiety, now with acute metabolic encephalopathy Plan Cont IV sedation Cont Clonazepam via tube Will repeat Triglycerides again in am.If they rise again we will need to change the propofol to versed  Paroxysmal Atrial fibrillation-->and symptomatic SVT Plan Tele amio gtt K > 4/Mg >2  HTN  Plan Add PRN hydralazine  Hyperglycemia Plan ssi w/ basal dosing.  Goal 140-180  H/o CLL Plan Chemo on hold Cont bactrim and valagciclovir.   Anemia of critical illness Plan Trending cbc Not sure that she is a candidate for transfusion even if she were to be more anemic given the unlikelihood she will survive   Worsening thrombocytopenia Plan Cont to monitor. ? Drug related.  Send HIT panel  Best practice:  Diet: regular Pain/Anxiety/Delirium protocol (if indicated): NA VAP protocol (if indicated): NA DVT prophylaxis: Eliquis (AF) GI prophylaxis: NA Glucose control: Per primary Mobility: BR Code Status: FULL-->DNR if arrests  Family Communication: husband updated via phone daily Disposition: Remain in ICU My critical care time is   cct 34 min  Erick Colace ACNP-BC [place] Pager # [phone] OR # [phone] if no answer

## 2020-06-08 NOTE — Progress Notes (Signed)
Pt started becoming more hypertensive overnight. Neo turned off and sedation has been turned up within BIS monitor parameters. Metoprolol had been held at beginning of shift d/t pt being bradycardic and hypotensive, spoke with e-link and agreed to give the metoprolol that had been held.

## 2020-06-09 ENCOUNTER — Inpatient Hospital Stay (HOSPITAL_COMMUNITY): Payer: BC Managed Care – PPO

## 2020-06-09 LAB — COMPREHENSIVE METABOLIC PANEL
ALT: 279 U/L — ABNORMAL HIGH (ref 0–44)
AST: 149 U/L — ABNORMAL HIGH (ref 15–41)
Albumin: 2.1 g/dL — ABNORMAL LOW (ref 3.5–5.0)
Alkaline Phosphatase: 71 U/L (ref 38–126)
Anion gap: 13 (ref 5–15)
BUN: 68 mg/dL — ABNORMAL HIGH (ref 6–20)
CO2: 28 mmol/L (ref 22–32)
Calcium: 7.8 mg/dL — ABNORMAL LOW (ref 8.9–10.3)
Chloride: 98 mmol/L (ref 98–111)
Creatinine, Ser: 1.06 mg/dL — ABNORMAL HIGH (ref 0.44–1.00)
GFR calc Af Amer: >60 mL/min (ref >60)
GFR calc non Af Amer: 57 mL/min — ABNORMAL LOW (ref >60)
Glucose, Bld: 249 mg/dL — ABNORMAL HIGH (ref 70–99)
Potassium: 3.8 mmol/L (ref 3.5–5.1)
Sodium: 139 mmol/L (ref 135–145)
Total Bilirubin: 0.5 mg/dL (ref 0.3–1.2)
Total Protein: 4.7 g/dL — ABNORMAL LOW (ref 6.5–8.1)

## 2020-06-09 LAB — GLUCOSE, CAPILLARY
Glucose-Capillary: 238 mg/dL — ABNORMAL HIGH (ref 70–99)
Glucose-Capillary: 161 mg/dL — ABNORMAL HIGH (ref 70–99)
Glucose-Capillary: 102 mg/dL — ABNORMAL HIGH (ref 70–99)
Glucose-Capillary: 152 mg/dL — ABNORMAL HIGH (ref 70–99)
Glucose-Capillary: 141 mg/dL — ABNORMAL HIGH (ref 70–99)

## 2020-06-09 LAB — CBC
HCT: 24.3 % — ABNORMAL LOW (ref 36.0–46.0)
Hemoglobin: 7.7 g/dL — ABNORMAL LOW (ref 12.0–15.0)
MCH: 29.3 pg (ref 26.0–34.0)
MCHC: 31.7 g/dL (ref 30.0–36.0)
MCV: 92.4 fL (ref 80.0–100.0)
Platelets: 36 10*3/uL — ABNORMAL LOW (ref 150–400)
RBC: 2.63 MIL/uL — ABNORMAL LOW (ref 3.87–5.11)
RDW: 16 % — ABNORMAL HIGH (ref 11.5–15.5)
WBC: 7.2 10*3/uL (ref 4.0–10.5)
nRBC: 0.4 % — ABNORMAL HIGH (ref 0.0–0.2)

## 2020-06-09 LAB — TRIGLYCERIDES: Triglycerides: 266 mg/dL — ABNORMAL HIGH (ref <150)

## 2020-06-09 LAB — MAGNESIUM: Magnesium: 1.5 mg/dL — ABNORMAL LOW (ref 1.7–2.4)

## 2020-06-09 MED ORDER — MIDAZOLAM HCL 2 MG/2ML IJ SOLN
2.0000 mg | Freq: Once | INTRAMUSCULAR | Status: AC
  Administered 2020-06-09: 2 mg via INTRAVENOUS

## 2020-06-09 MED ORDER — CLONAZEPAM 1 MG PO TABS
1.0000 mg | ORAL_TABLET | Freq: Two times a day (BID) | ORAL | Status: DC
  Administered 2020-06-09 – 2020-06-13 (×8): 1 mg
  Filled 2020-06-09 (×8): qty 1

## 2020-06-09 MED ORDER — DIGOXIN 0.25 MG/ML IJ SOLN
0.5000 mg | Freq: Once | INTRAMUSCULAR | Status: AC
  Administered 2020-06-09: 0.5 mg via INTRAVENOUS
  Filled 2020-06-09 (×2): qty 2

## 2020-06-09 MED ORDER — AMIODARONE IV BOLUS ONLY 150 MG/100ML
150.0000 mg | Freq: Once | INTRAVENOUS | Status: AC
  Administered 2020-06-09: 150 mg via INTRAVENOUS
  Filled 2020-06-09: qty 100

## 2020-06-09 NOTE — Progress Notes (Signed)
[place] Progress Note Patient Name: Audrey Gross DOB: 04-10-1961 MRN: [id number]   Date of Service  06/09/2020  HPI/Events of Note  AFIB with RVR - Ventricular rate = 140.  Currently on an Amiodarone IV infusion.   eICU Interventions  Plan:  1. Amiodarone 10 mg IV over 10 minutes now. 2. Please send AM labs now.      Intervention Category Major Interventions: Arrhythmia - evaluation and management  Shiryl Ruddy [place] 06/09/2020, 3:55 AM

## 2020-06-09 NOTE — Progress Notes (Signed)
Patient's NG feeding tube clogged, unable to flush at all. Verbal order from CCM MD to remove current NG tube and replace with OG. Will confirm with xray image.

## 2020-06-09 NOTE — Progress Notes (Signed)
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 Was admitted through 8/7 and discharged to home, but was re-admitted 8/15 with worsening respiratory distress. She remained dyspneic despite 40L HFNC and PCCM was consulted 8/17.  Past Medical History   has a past medical history of Atrial fibrillation ([place]), Cystic breast, Depression, Diffuse cystic mastopathy, Dyslipidemia (high LDL; low HDL), Hypertension, Iron deficiency anemia, unspecified, Lymphadenopathy of head and neck (04/05/2015), Medical non-compliance, Mitral regurgitation (02/2017), and Persistent atrial fibrillation with rapid ventricular response ([place]) (07/15/2017).  Significant Hospital Events   8/1 >8/5 admit for COVID 19 8/15 re-admitted with worsening hypoxia 8/17 PCCM consult.  Course from 8/17 to 8/30: elevated trop I-->these normalized. NAGMA resolved.  She was given a repeat 5-day course of IV remdesivir, completed on 8/20. She completed 5-day course of antibiotics because of elevated procalcitonin. Treated for esophagitis. She remains on baricitinib since 8/17, a maximum of 14 days Currently on Solu-Medrol IV 40 mg twice daily. On 8/30 developed sig worsening resp failure. RR 60s. sats 60s. Marked work of breathing. New lucency mediastinum. Intubated. ABX resumed.  IV Lasix 8/31: Very poor lung compliance, but tidal volume reduced to 5 cc/kg predicted body weight.  Had to go back to 6cc/kg d/t sig hypercarbia. Stable pneumomediastinum.  Diffuse bilateral airspace disease persists.  Pulse oximetry remaining above 85% on PEEP 8/FiO2 50%.  Requiring as needed neuromuscular blockade; attempting prone positioning. Added bicarb gtt in afternoon to compensate for respiratory acidosis and keep Ph > 7.2 Had  episode of SVT w/ HR 248. Converted on own just as cardioversion pads being put in place.  9/1: two more episodes of narrow complex tachycardia over night. Back in supine position.  Has moderate to large pneumomediastinum this was evaluated by CT scan.  There was no pneumothorax.  Tidal volume changed to 5 cc/kg predicted body weight with limit PEEP again at 8, trying to decrease risk of pneumomediastinum worsening.  Also added neuromuscular blockade.  Was placed on amiodarone infusion for recurrent SVT.  [name] 9/2: Not much change, currently in the prone position.  Stopping vancomycin as MRSA cultures all negative Made her DNR based on little to add if arrests 9/3 off pressors, pneumomediastinum a little better. Vent setting: ## Stopping abx as nothing grew in cultures.  Stopping neuromuscular blockade. 9/4-No overnight events Consults:  PCCM  Procedures:  oett 8/30>>> Right internal jugular vein triple-lumen catheter placed 9/1 Significant Diagnostic Tests:    Micro Data:  resp culture 8/30>>>neg  Antimicrobials:  Vancomycin 8/15 > 8/17 Azithromycin 8/15> off Cefepime 8/15 > off Bactrim 8/15 > 8/30 vanc>>> 9/2 Cefepime 8/30>>>9/3  Interim history/subjective:  No overnight events No signs of improvement  Objective   Blood pressure (!) 137/95, pulse (!) 137, temperature 98.4 F (36.9 C), resp. rate 18, height 5\' 7"  (1.702 m), weight 79.6 kg, last menstrual period 02/03/2017, SpO2 90 %.    Vent Mode: PRVC FiO2 (%):  [70 %-100 %] 100 % Set Rate:  [32 bmp] 32 bmp Vt Set:  [370 mL] 370 mL PEEP:  [8 cmH20] 8 cmH20 Plateau Pressure:  [14 cmH20-26 cmH20] 24 cmH20   Intake/Output Summary (Last 24 hours) at 06/09/2020 1425 Last data filed at 06/09/2020  1400 Gross per 24 hour  Intake 3689.77 ml  Output 3750 ml  Net -60.23 ml   Filed Weights   06/06/20 0500 06/08/20 0500 06/09/20 0628  Weight: 65.9 kg 76.6 kg 79.6 kg    Physical  Exam: General: Chronically ill-appearing, sedate. HEENT: ET tube in place  pulmonary: coarse breath sounds bilaterally  cardiac: S1-S2 appreciated Abdomen: Soft, bowel sounds appreciated Neuro: Sedated, not following commands GU: Clear yellow urine  Resolved Hospital Problem list   Drug-related hypotension Assessment & Plan:   Acute hypoxemic respiratory failure secondary to COVID-19 pneumonia, ARDS, fibrotic disease -Continue mechanical ventilation per ARDS protocol Target TVol 6-8cc/kgIBW Target Plateau Pressure < 30cm H20 Target driving pressure less than 15 cm of water Target PaO2 55-65: titrate PEEP/FiO2 per protocol As long as PaO2 to FiO2 ratio is less than 1:150 position in prone position for 16 hours a day Ventilator associated pneumonia prevention protocol Not proning secondary to significant hemodynamic compromise  Chest x-ray unchanged  Pneumomediastinum -Improving -Continue to monitor chest x-ray changes  Metabolic encephalopathy -Sedation as needed -Clonazepam via tube-decrease to 1 mg twice daily  Paroxysmal atrial fibrillation -Amiodarone gtt. -Telemetry monitoring -Correct electrolytes -Cardene  Hypertension -As needed hydralazine  History of CLL -Chemotherapy on hold -On Bactrim, valganciclovir   Anemia of critical illness Plan -Continue to trend  Worsening thrombocytopenia Plan Cont to monitor. ? Drug related.  Follow HIT panel Hold Lovenox  Best practice:  Diet: Tube feeds Pain/Anxiety/Delirium protocol (if indicated): Propofol, fentanyl VAP protocol (if indicated): In place  DVT prophylaxis: GI prophylaxis: NA Glucose control:SSI Mobility: BR Code Status: FULL-->DNR if arrests  Family Communication: husband updated via phone daily Disposition: Remain in ICU   The patient is critically ill with multiple organ systems failure and requires high complexity decision making for assessment and support, frequent evaluation and  titration of therapies, application of advanced monitoring technologies and extensive interpretation of multiple databases. Critical Care Time devoted to patient care services described in this note independent of APP/resident time (if applicable)  is 40 minutes.   Sherrilyn Rist MD [facility] Personal pager: [phone] If unanswered, please page CCM On-call: [phone]

## 2020-06-10 ENCOUNTER — Inpatient Hospital Stay (HOSPITAL_COMMUNITY): Payer: BC Managed Care – PPO

## 2020-06-10 LAB — CBC WITH DIFFERENTIAL/PLATELET
Abs Immature Granulocytes: 0.02 10*3/uL (ref 0.00–0.07)
Basophils Absolute: 0 10*3/uL (ref 0.0–0.1)
Basophils Relative: 0 %
Eosinophils Absolute: 0 10*3/uL (ref 0.0–0.5)
Eosinophils Relative: 0 %
HCT: 22.9 % — ABNORMAL LOW (ref 36.0–46.0)
Hemoglobin: 7 g/dL — ABNORMAL LOW (ref 12.0–15.0)
Immature Granulocytes: 0 %
Lymphocytes Relative: 67 %
Lymphs Abs: 4.6 10*3/uL — ABNORMAL HIGH (ref 0.7–4.0)
MCH: 28.9 pg (ref 26.0–34.0)
MCHC: 30.6 g/dL (ref 30.0–36.0)
MCV: 94.6 fL (ref 80.0–100.0)
Monocytes Absolute: 2 10*3/uL — ABNORMAL HIGH (ref 0.1–1.0)
Monocytes Relative: 29 %
Neutro Abs: 0.3 10*3/uL — ABNORMAL LOW (ref 1.7–7.7)
Neutrophils Relative %: 4 %
Platelets: 26 10*3/uL — CL (ref 150–400)
RBC: 2.42 MIL/uL — ABNORMAL LOW (ref 3.87–5.11)
RDW: 16.4 % — ABNORMAL HIGH (ref 11.5–15.5)
WBC: 6.9 10*3/uL (ref 4.0–10.5)
nRBC: 1 % — ABNORMAL HIGH (ref 0.0–0.2)

## 2020-06-10 LAB — BASIC METABOLIC PANEL
Anion gap: 14 (ref 5–15)
BUN: 92 mg/dL — ABNORMAL HIGH (ref 6–20)
CO2: 26 mmol/L (ref 22–32)
Calcium: 8.2 mg/dL — ABNORMAL LOW (ref 8.9–10.3)
Chloride: 97 mmol/L — ABNORMAL LOW (ref 98–111)
Creatinine, Ser: 1.59 mg/dL — ABNORMAL HIGH (ref 0.44–1.00)
GFR calc Af Amer: 41 mL/min — ABNORMAL LOW (ref >60)
GFR calc non Af Amer: 35 mL/min — ABNORMAL LOW (ref >60)
Glucose, Bld: 238 mg/dL — ABNORMAL HIGH (ref 70–99)
Potassium: 4.5 mmol/L (ref 3.5–5.1)
Sodium: 137 mmol/L (ref 135–145)

## 2020-06-10 LAB — GLUCOSE, CAPILLARY
Glucose-Capillary: 175 mg/dL — ABNORMAL HIGH (ref 70–99)
Glucose-Capillary: 188 mg/dL — ABNORMAL HIGH (ref 70–99)
Glucose-Capillary: 211 mg/dL — ABNORMAL HIGH (ref 70–99)
Glucose-Capillary: 119 mg/dL — ABNORMAL HIGH (ref 70–99)

## 2020-06-10 LAB — TRIGLYCERIDES: Triglycerides: 269 mg/dL — ABNORMAL HIGH (ref <150)

## 2020-06-10 LAB — HEPARIN INDUCED PLATELET AB (HIT ANTIBODY): Heparin Induced Plt Ab: 0.061 OD (ref 0.000–0.400)

## 2020-06-10 MED ORDER — VECURONIUM BOLUS VIA INFUSION: 0.0800 mg/kg | Freq: Once | INTRAVENOUS | Status: DC

## 2020-06-10 MED ORDER — METOPROLOL TARTRATE 5 MG/5ML IV SOLN
5.0000 mg | Freq: Once | INTRAVENOUS | Status: AC
  Administered 2020-06-10: 5 mg via INTRAVENOUS
  Filled 2020-06-10: qty 5

## 2020-06-10 MED ORDER — SODIUM CHLORIDE 0.45 % IV SOLN: INTRAVENOUS | Status: DC

## 2020-06-10 MED ORDER — SODIUM CHLORIDE 0.45 % IV SOLN: INTRAVENOUS | Status: AC

## 2020-06-10 MED ORDER — VECURONIUM BROMIDE 10 MG IV SOLR
0.0800 mg/kg | Freq: Once | INTRAVENOUS | Status: AC
  Administered 2020-06-10: 6.4 mg via INTRAVENOUS
  Filled 2020-06-10: qty 10

## 2020-06-10 MED ORDER — DIGOXIN 0.25 MG/ML IJ SOLN
0.2500 mg | Freq: Once | INTRAMUSCULAR | Status: AC
  Administered 2020-06-10: 0.25 mg via INTRAVENOUS
  Filled 2020-06-10: qty 2

## 2020-06-10 MED ORDER — VECURONIUM BROMIDE 10 MG IV SOLR: 0.0800 mg/kg | Freq: Once | INTRAVENOUS | Status: AC

## 2020-06-10 MED ORDER — VECURONIUM BROMIDE 10 MG IV SOLR
INTRAVENOUS | Status: AC
  Administered 2020-06-10: 6.4 mg via INTRAVENOUS
  Filled 2020-06-10: qty 10

## 2020-06-10 MED ORDER — MIDAZOLAM HCL 2 MG/2ML IJ SOLN
2.0000 mg | Freq: Once | INTRAMUSCULAR | Status: AC
  Administered 2020-06-10: 2 mg via INTRAVENOUS
  Filled 2020-06-10: qty 2

## 2020-06-10 NOTE — Progress Notes (Addendum)
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 Was admitted through 8/7 and discharged to home, but was re-admitted 8/15 with worsening respiratory distress. She remained dyspneic despite 40L HFNC and PCCM was consulted 8/17.  Past Medical History   has a past medical history of Atrial fibrillation ([place]), Cystic breast, Depression, Diffuse cystic mastopathy, Dyslipidemia (high LDL; low HDL), Hypertension, Iron deficiency anemia, unspecified, Lymphadenopathy of head and neck (04/05/2015), Medical non-compliance, Mitral regurgitation (02/2017), and Persistent atrial fibrillation with rapid ventricular response ([place]) (07/15/2017).  Significant Hospital Events   8/1 >8/5 admit for COVID 19 8/15 re-admitted with worsening hypoxia 8/17 PCCM consult.  Course from 8/17 to 8/30: elevated trop I-->these normalized. NAGMA resolved.  She was given a repeat 5-day course of IV remdesivir, completed on 8/20. She completed 5-day course of antibiotics because of elevated procalcitonin. Treated for esophagitis. She remains on baricitinib since 8/17, a maximum of 14 days Currently on Solu-Medrol IV 40 mg twice daily. On 8/30 developed sig worsening resp failure. RR 60s. sats 60s. Marked work of breathing. New lucency mediastinum. Intubated. ABX resumed.  IV Lasix 8/31: Very poor lung compliance, but tidal volume reduced to 5 cc/kg predicted body weight.  Had to go back to 6cc/kg d/t sig hypercarbia. Stable pneumomediastinum.  Diffuse bilateral airspace disease persists.  Pulse oximetry remaining above 85% on PEEP 8/FiO2 50%.  Requiring as needed neuromuscular blockade; attempting prone positioning. Added bicarb gtt in afternoon to compensate for respiratory acidosis and keep Ph > 7.2 Had  episode of SVT w/ HR 248. Converted on own just as cardioversion pads being put in place.  9/1: two more episodes of narrow complex tachycardia over night. Back in supine position.  Has moderate to large pneumomediastinum this was evaluated by CT scan.  There was no pneumothorax.  Tidal volume changed to 5 cc/kg predicted body weight with limit PEEP again at 8, trying to decrease risk of pneumomediastinum worsening.  Also added neuromuscular blockade.  Was placed on amiodarone infusion for recurrent SVT.  [name] 9/2: Not much change, currently in the prone position.  Stopping vancomycin as MRSA cultures all negative Made her DNR based on little to add if arrests 9/3 off pressors, pneumomediastinum a little better. Vent setting: ## Stopping abx as nothing grew in cultures.  Stopping neuromuscular blockade. 9/5-ventilator dyssynchrony requiring dose of paralytic Consults:  PCCM  Procedures:  oett 8/30>>> Right internal jugular vein triple-lumen catheter placed 9/1 Significant Diagnostic Tests:    Micro Data:  resp culture 8/30>>>neg  Antimicrobials:  Vancomycin 8/15 > 8/17 Azithromycin 8/15> off Cefepime 8/15 > off Bactrim 8/15 > 8/30 vanc>>> 9/2 Cefepime 8/30>>>9/3  Interim history/subjective:  Ventilator dyssynchrony requiring paralytic No signs of improvement  Objective   Blood pressure (!) 137/42, pulse (!) 58, temperature (!) 95.2 F (35.1 C), temperature source Bladder, resp. rate (!) 35, height 5\' 7"  (1.702 m), weight 80 kg, last menstrual period 02/03/2017, SpO2 94 %.    Vent Mode: PRVC FiO2 (%):  [90 %-100 %] 90 % Set Rate:  [32 bmp] 32 bmp Vt Set:  [370 mL] 370 mL PEEP:  [8 cmH20-10 cmH20] 8 cmH20 Plateau Pressure:  [25 cmH20-30 cmH20] 27 cmH20   Intake/Output Summary (Last  24 hours) at 06/10/2020 1136 Last data filed at 06/09/2020 2200 Gross per 24 hour  Intake 970.67 ml  Output 640 ml  Net 330.67 ml   Filed Weights    06/08/20 0500 06/09/20 0628 06/10/20 0538  Weight: 76.6 kg 79.6 kg 80 kg    Physical Exam: General: Chronically ill-appearing, sedate HEENT: ET tube in place pulmonary: Coarse breath sounds, decreased at the bases cardiac: S1-S2 appreciated, no murmur Abdomen: Soft, bowel sounds appreciated Neuro: Sedate  Resolved Hospital Problem list   Drug-related hypotension Assessment & Plan:   Acute hypoxic respiratory failure secondary to COVID-19 pneumonia, ARDS, fibrotic disease Continue mechanical ventilation per ARDS protocol Target TVol 6-8cc/kgIBW Target Plateau Pressure < 30cm H20 Target driving pressure less than 15 cm of water Target PaO2 55-65: titrate PEEP/FiO2 per protocol As long as PaO2 to FiO2 ratio is less than 1:150 position in prone position for 16 hours a day Ventilator associated pneumonia prevention protocol No proning secondary to hemodynamic instability  Pneumomediastinum -improved on x-ray  Metabolic encephalopathy -Sedation as needed -Clonazepam per tube  Paroxysmal atrial fibrillation -On amiodarone -Replete electrolytes  Hypertension -As needed hydralazine  Acute kidney injury -Monitor closely. -Hydrate  History of CLL -Chemotherapy on hold -On Bactrim, valganciclovir  Anemia of critical illness Plan -Continue to trend  Worsening thrombocytopenia Plan Cont to monitor. ? Drug related.  Acute illness related Follow HIT panel Hold Lovenox  Hold Bactrim with increasing BUN/creatinine  Best practice:  Diet: Tube feeds Pain/Anxiety/Delirium protocol (if indicated): Propofol, fentanyl VAP protocol (if indicated): In place  DVT prophylaxis: GI prophylaxis: NA Glucose control:SSI Mobility: BR Code Status: FULL-->DNR if arrests  Family Communication: husband updated via phone daily Disposition: Remain in ICU  The patient is critically ill with multiple organ systems failure and requires high complexity decision making for assessment and  support, frequent evaluation and titration of therapies, application of advanced monitoring technologies and extensive interpretation of multiple databases. Critical Care Time devoted to patient care services described in this note independent of APP/resident time (if applicable)  is 35 minutes.   Sherrilyn Rist MD [facility] Personal pager: [phone] If unanswered, please page CCM On-call: [phone]

## 2020-06-10 NOTE — Progress Notes (Signed)
[facility] Progress Note Patient Name: Audrey Gross DOB: 05-Aug-1961 MRN: [id number]   Date of Service  06/10/2020  HPI/Events of Note  Ventilator dyssynchrony with desaturation.  eICU Interventions  Bedside RN instructed to increase Propofol and fentanyl infusion rates, Versed 2 mg iv x 1, vecuronium 6 mg iv x 1.        [name] [name] 06/10/2020, 1:01 AM

## 2020-06-11 LAB — BASIC METABOLIC PANEL
Anion gap: 15 (ref 5–15)
BUN: 106 mg/dL — ABNORMAL HIGH (ref 6–20)
CO2: 24 mmol/L (ref 22–32)
Calcium: 8.3 mg/dL — ABNORMAL LOW (ref 8.9–10.3)
Chloride: 94 mmol/L — ABNORMAL LOW (ref 98–111)
Creatinine, Ser: 2.08 mg/dL — ABNORMAL HIGH (ref 0.44–1.00)
GFR calc Af Amer: 29 mL/min — ABNORMAL LOW (ref >60)
GFR calc non Af Amer: 25 mL/min — ABNORMAL LOW (ref >60)
Glucose, Bld: 141 mg/dL — ABNORMAL HIGH (ref 70–99)
Potassium: 5.2 mmol/L — ABNORMAL HIGH (ref 3.5–5.1)
Sodium: 133 mmol/L — ABNORMAL LOW (ref 135–145)

## 2020-06-11 LAB — GLUCOSE, CAPILLARY
Glucose-Capillary: 127 mg/dL — ABNORMAL HIGH (ref 70–99)
Glucose-Capillary: 172 mg/dL — ABNORMAL HIGH (ref 70–99)
Glucose-Capillary: 146 mg/dL — ABNORMAL HIGH (ref 70–99)
Glucose-Capillary: 117 mg/dL — ABNORMAL HIGH (ref 70–99)
Glucose-Capillary: 148 mg/dL — ABNORMAL HIGH (ref 70–99)
Glucose-Capillary: 186 mg/dL — ABNORMAL HIGH (ref 70–99)

## 2020-06-11 LAB — CBC WITH DIFFERENTIAL/PLATELET
Abs Immature Granulocytes: 0 10*3/uL (ref 0.00–0.07)
Basophils Absolute: 0 10*3/uL (ref 0.0–0.1)
Basophils Relative: 0 %
Eosinophils Absolute: 0 10*3/uL (ref 0.0–0.5)
Eosinophils Relative: 0 %
HCT: 23.9 % — ABNORMAL LOW (ref 36.0–46.0)
Hemoglobin: 7.3 g/dL — ABNORMAL LOW (ref 12.0–15.0)
Lymphocytes Relative: 96 %
Lymphs Abs: 11.6 10*3/uL — ABNORMAL HIGH (ref 0.7–4.0)
MCH: 29 pg (ref 26.0–34.0)
MCHC: 30.5 g/dL (ref 30.0–36.0)
MCV: 94.8 fL (ref 80.0–100.0)
Monocytes Absolute: 0.2 10*3/uL (ref 0.1–1.0)
Monocytes Relative: 2 %
Neutro Abs: 0.2 10*3/uL — ABNORMAL LOW (ref 1.7–7.7)
Neutrophils Relative %: 2 %
Platelets: 23 10*3/uL — CL (ref 150–400)
RBC: 2.52 MIL/uL — ABNORMAL LOW (ref 3.87–5.11)
RDW: 16.8 % — ABNORMAL HIGH (ref 11.5–15.5)
WBC Morphology: ABNORMAL
WBC: 12.1 10*3/uL — ABNORMAL HIGH (ref 4.0–10.5)
nRBC: 2.4 % — ABNORMAL HIGH (ref 0.0–0.2)

## 2020-06-11 LAB — TRIGLYCERIDES: Triglycerides: 283 mg/dL — ABNORMAL HIGH (ref <150)

## 2020-06-11 MED ORDER — VECURONIUM BROMIDE 10 MG IV SOLR
INTRAVENOUS | Status: AC
  Administered 2020-06-11: 6.4 mg via INTRAVENOUS
  Filled 2020-06-11: qty 10

## 2020-06-11 MED ORDER — VECURONIUM BROMIDE 10 MG IV SOLR: 0.0800 mg/kg | Freq: Once | INTRAVENOUS | Status: AC

## 2020-06-11 MED ORDER — MIDAZOLAM HCL 2 MG/2ML IJ SOLN
2.0000 mg | Freq: Once | INTRAMUSCULAR | Status: AC
  Administered 2020-06-11: 2 mg via INTRAVENOUS
  Filled 2020-06-11: qty 2

## 2020-06-11 NOTE — Progress Notes (Signed)
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 Was admitted through 8/7 and discharged to home, but was re-admitted 8/15 with worsening respiratory distress. She remained dyspneic despite 40L HFNC and PCCM was consulted 8/17.  Past Medical History   has a past medical history of Atrial fibrillation ([place]), Cystic breast, Depression, Diffuse cystic mastopathy, Dyslipidemia (high LDL; low HDL), Hypertension, Iron deficiency anemia, unspecified, Lymphadenopathy of head and neck (04/05/2015), Medical non-compliance, Mitral regurgitation (02/2017), and Persistent atrial fibrillation with rapid ventricular response ([place]) (07/15/2017).  Significant Hospital Events   8/1 >8/5 admit for COVID 19 8/15 re-admitted with worsening hypoxia 8/17 PCCM consult.  Course from 8/17 to 8/30: elevated trop I-->these normalized. NAGMA resolved.  She was given a repeat 5-day course of IV remdesivir, completed on 8/20. She completed 5-day course of antibiotics because of elevated procalcitonin. Treated for esophagitis. She remains on baricitinib since 8/17, a maximum of 14 days Currently on Solu-Medrol IV 40 mg twice daily. On 8/30 developed sig worsening resp failure. RR 60s. sats 60s. Marked work of breathing. New lucency mediastinum. Intubated. ABX resumed.  IV Lasix 8/31: Very poor lung compliance, but tidal volume reduced to 5 cc/kg predicted body weight.  Had to go back to 6cc/kg d/t sig hypercarbia. Stable pneumomediastinum.  Diffuse bilateral airspace disease persists.  Pulse oximetry remaining above 85% on PEEP 8/FiO2 50%.  Requiring as needed neuromuscular blockade; attempting prone positioning. Added bicarb gtt in afternoon to compensate for respiratory acidosis and keep Ph > 7.2 Had  episode of SVT w/ HR 248. Converted on own just as cardioversion pads being put in place.  9/1: two more episodes of narrow complex tachycardia over night. Back in supine position.  Has moderate to large pneumomediastinum this was evaluated by CT scan.  There was no pneumothorax.  Tidal volume changed to 5 cc/kg predicted body weight with limit PEEP again at 8, trying to decrease risk of pneumomediastinum worsening.  Also added neuromuscular blockade.  Was placed on amiodarone infusion for recurrent SVT.  [name] 9/2: Not much change, currently in the prone position.  Stopping vancomycin as MRSA cultures all negative Made her DNR based on little to add if arrests 9/3 off pressors, pneumomediastinum a little better. Vent setting: ## Stopping abx as nothing grew in cultures.  Stopping neuromuscular blockade. 9/5-ventilator dyssynchrony requiring dose of paralytic  Consults:  PCCM  Procedures:  oett 8/30>>> Right internal jugular vein triple-lumen catheter placed 9/1  Significant Diagnostic Tests:    Micro Data:  resp culture 8/30>>>neg  Antimicrobials:  Vancomycin 8/15 > 8/17 Azithromycin 8/15> off Cefepime 8/15 > off Bactrim 8/15 > 8/30 vanc>>> 9/2 Cefepime 8/30>>>9/3  Interim history/subjective:   Critically ill mechanical life support.  Objective   Blood pressure (!) 168/62, pulse 80, temperature 99.3 F (37.4 C), resp. rate (!) 32, height 5\' 7"  (1.702 m), weight 80 kg, last menstrual period 02/03/2017, SpO2 (!) 83 %.    Vent Mode: PRVC FiO2 (%):  [90 %-100 %] 100 % Set Rate:  [32 bmp] 32 bmp Vt Set:  [370 mL] 370 mL PEEP:  [8 cmH20-10 cmH20] 10 cmH20 Plateau Pressure:  [27 cmH20-34 cmH20] 29 cmH20   Intake/Output Summary (Last 24 hours) at 06/11/2020  1157 Last data filed at 06/11/2020 0205 Gross per 24 hour  Intake 2768 ml  Output 750 ml  Net 2018 ml   Filed Weights   06/08/20 0500 06/09/20 0628 06/10/20 0538  Weight: 76.6 kg  79.6 kg 80 kg    Physical Exam: General: Female, intubated, obese on mechanical life support HEENT: Endotracheal tube in place pulmonary: Bilateral mechanically ventilated breath sounds cardiac: Regular rhythm, S1-S2 Abdomen: Soft nondistended nondistended Neuro: Sedated on mechanical life support  Resolved Hospital Problem list   Drug-related hypotension  Assessment & Plan:   Acute hypoxic respiratory failure secondary to COVID-19 pneumonia, ARDS, fibrotic disease Continue mechanical ventilation per ARDS protocol Target TVol 6-8cc/kgIBW Target Plateau Pressure < 30cm H20 Target driving pressure less than 15 cm of water Target PaO2 55-65: titrate PEEP/FiO2 per protocol As long as PaO2 to FiO2 ratio is less than 1:150 position in prone position for 16 hours a day Check CVP daily if CVL in place Target CVP less than 4, diurese as necessary Ventilator associated pneumonia prevention protocol  Pneumomediastinum -Stable on x-ray, improved  Metabolic encephalopathy -Related to critical illness and sedation. -Continue to observe  Paroxysmal atrial fibrillation -Repeat electrolytes -Continue on amiodarone  Hypertension -As needed hydralazine  Acute kidney injury -Follow urine output -Follow BMP  History of CLL -Chemotherapy on hold -Ppx Bactrim plus valganciclovir - holding bactrim with increasing  Anemia of critical illness Plan -Conservative transfusion threshold for hemoglobin less than 7.  Worsening thrombocytopenia Plan Cont to monitor. ? Drug related.  Acute illness related To factorial etiology, potentially ICU related Follow HIT panel -Lovenox on hold Continue to follow   Best practice:  Diet: Tube feeds Pain/Anxiety/Delirium protocol (if indicated): Propofol, fentanyl VAP protocol (if indicated): In place  DVT prophylaxis: GI prophylaxis: NA Glucose control:SSI Mobility: BR Code Status: DNR if arrests  Family Communication: team will update  family  Disposition: ICU  This patient is critically ill with multiple organ system failure; which, requires frequent high complexity decision making, assessment, support, evaluation, and titration of therapies. This was completed through the application of advanced monitoring technologies and extensive interpretation of multiple databases. During this encounter critical care time was devoted to patient care services described in this note for 32 minutes.  Garner Nash, DO [facility] 06/11/2020 8:57 AM

## 2020-06-11 NOTE — Progress Notes (Signed)
[facility] Progress Note Patient Name: Audrey Gross DOB: [birth year] MRN: [id number]   Date of Service  06/11/2020  HPI/Events of Note  K+ 5.2, Na+ 133, Platelets 23 K  eICU Interventions  Continue to monitor  Platelets and K+ closely, no intervention indicated  At this time.        Kerry Kass Tabb Croghan 06/11/2020, 4:43 AM

## 2020-06-11 NOTE — Progress Notes (Signed)
[facility] Progress Note Patient Name: Audrey Gross DOB: 01-26-1961 MRN: [id number]   Date of Service  06/11/2020  HPI/Events of Note  Ventilator dyssynchrony with desaturation on Propofol 45 mcg + fentanyl infusion.  eICU Interventions  Versed 2 mg iv x 1 + Vecuronium 6 mg iv x 1        Brayden Brodhead U Lake[place]ra [place] 06/11/2020, 6:30 AM

## 2020-06-11 NOTE — Progress Notes (Signed)
[facility] Progress Note Patient Name: Audrey Gross DOB: 06/07/1961 MRN: [id number]   Date of Service  06/11/2020  HPI/Events of Note  Pt keep desaturating  in the low 80's earlier when rounding pt slight dysysnchrounous with the vent, instructed RN to titrate sedation and give PRN >.had adose of versed 2319 2mg  and was given fentanyl bolus >>pt appears comfortable and synchrony with the vent>> sats remain in the low 80's>>what goal parameters you want for pt sats>>last x-ray was 9/5 and blood gas was 9/3  Camera: 370/12/32/100%, PIP 33. UOP decreasing.  DNR as per RN/notes. Sats 82%. Art line; MAP 68.  S/p A fib. Now in sinus.   Received vecuronium 6 AM.  Pneumo mediastinum  Cr > 2.    eICU Interventions  Get ABG and CxR.   Discussed with lauren, RN.      Intervention Category Intermediate Interventions: Respiratory distress - evaluation and management  Elmer Sow 06/11/2020, 11:45 PM

## 2020-06-11 NOTE — Progress Notes (Signed)
Patient has had less than 100 mLs of clear, yellow urine this shift from foley. Bladder scan performed, and showed 0 mLs in bladder. Dr. Valeta Harms notified by this RN; no new orders at this time. RN will continue to carefully monitor.

## 2020-06-11 NOTE — TOC Progression Note (Signed)
Transition of Care [facility]) - Progression Note    Patient Details  Name: Audrey Gross MRN: [id number] Date of Birth: [birth year]  [facility] CM/SW Contact  Leeroy Cha, RN Phone Number: 06/11/2020, 9:41 AM  Clinical Narrative:     [facility] Hospital Events   8/1 >8/5 admit for COVID 19 8/15 re-admitted with worsening hypoxia 8/17 PCCM consult.  Course from 8/17 to 8/30: elevated trop I-->these normalized. NAGMA resolved.  She was given a repeat 5-day course of IV remdesivir, completed on 8/20. She completed 5-day course of antibiotics because of elevated procalcitonin. Treated for esophagitis. She remains on baricitinib since 8/17,a maximum of 14 daysCurrently on Solu-Medrol IV 40 mg twice daily. On 8/30 developed sig worsening resp failure. RR 60s. sats 60s. Marked work of breathing. New lucency mediastinum. Intubated. ABX resumed.  IV Lasix 8/31: Very poor lung compliance, but tidal volume reduced to 5 cc/kg predicted body weight.  Had to go back to 6cc/kg d/t sig hypercarbia. Stable pneumomediastinum.  Diffuse bilateral airspace disease persists.  Pulse oximetry remaining above 85% on PEEP 8/FiO2 50%.  Requiring as needed neuromuscular blockade; attempting prone positioning. Added bicarb gtt in afternoon to compensate for respiratory acidosis and keep Ph > 7.2 Had episode of SVT w/ HR 248. Converted on own just as cardioversion pads being put in place.  9/1: two more episodes of narrow complex tachycardia over night. Back in supine position.  Has moderate to large pneumomediastinum this was evaluated by CT scan.  There was no pneumothorax.  Tidal volume changed to 5 cc/kg predicted body weight with limit PEEP again at 8, trying to decrease risk of pneumomediastinum worsening.  Also added neuromuscular blockade.  Was placed on amiodarone infusion for recurrent SVT.  [name] 9/2: Not much change, currently in the prone position.   Stopping vancomycin as MRSA cultures all negative Made her DNR based on little to add if arrests 9/3 off pressors, pneumomediastinum a little better. Vent setting: ## Stopping abx as nothing grew in cultures.  Stopping neuromuscular blockade. 9/5-ventilator dyssynchrony requiring dose of paralytic Possible ltach candidate when cardiac stable.   Expected Discharge Plan: Home/Self Care Barriers to Discharge: Continued Medical Work up  Expected Discharge Plan and Services Expected Discharge Plan: Home/Self Care   Discharge Planning Services: CM Consult   Living arrangements for the past 2 months: Single Family Home                                       Social Determinants of Health (SDOH) Interventions    Readmission Risk Interventions No flowsheet data found.

## 2020-06-11 NOTE — Progress Notes (Signed)
Pt SpO2 staying below 85%.  PEEP increased to 12 cm H2O.

## 2020-06-12 ENCOUNTER — Inpatient Hospital Stay (HOSPITAL_COMMUNITY): Payer: BC Managed Care – PPO

## 2020-06-12 DIAGNOSIS — J9600 Acute respiratory failure, unspecified whether with hypoxia or hypercapnia: Secondary | ICD-10-CM

## 2020-06-12 LAB — CBC WITH DIFFERENTIAL/PLATELET
Abs Immature Granulocytes: 0 10*3/uL (ref 0.00–0.07)
Basophils Absolute: 0.1 10*3/uL (ref 0.0–0.1)
Basophils Relative: 0 %
Eosinophils Absolute: 0 10*3/uL (ref 0.0–0.5)
Eosinophils Relative: 0 %
HCT: 18.4 % — ABNORMAL LOW (ref 36.0–46.0)
Hemoglobin: 5.9 g/dL — CL (ref 12.0–15.0)
Immature Granulocytes: 0 %
Lymphocytes Relative: 69 %
Lymphs Abs: 9.8 10*3/uL — ABNORMAL HIGH (ref 0.7–4.0)
MCH: 29.9 pg (ref 26.0–34.0)
MCHC: 32.1 g/dL (ref 30.0–36.0)
MCV: 93.4 fL (ref 80.0–100.0)
Monocytes Absolute: 4.2 10*3/uL — ABNORMAL HIGH (ref 0.1–1.0)
Monocytes Relative: 29 %
Neutro Abs: 0.3 10*3/uL — ABNORMAL LOW (ref 1.7–7.7)
Neutrophils Relative %: 2 %
Platelets: 15 10*3/uL — CL (ref 150–400)
RBC: 1.97 MIL/uL — ABNORMAL LOW (ref 3.87–5.11)
RDW: 17.2 % — ABNORMAL HIGH (ref 11.5–15.5)
WBC: 14.4 10*3/uL — ABNORMAL HIGH (ref 4.0–10.5)
nRBC: 3.3 % — ABNORMAL HIGH (ref 0.0–0.2)

## 2020-06-12 LAB — BASIC METABOLIC PANEL
Anion gap: 20 — ABNORMAL HIGH (ref 5–15)
BUN: 136 mg/dL — ABNORMAL HIGH (ref 6–20)
CO2: 23 mmol/L (ref 22–32)
Calcium: 7.6 mg/dL — ABNORMAL LOW (ref 8.9–10.3)
Chloride: 90 mmol/L — ABNORMAL LOW (ref 98–111)
Creatinine, Ser: 2.89 mg/dL — ABNORMAL HIGH (ref 0.44–1.00)
GFR calc Af Amer: 20 mL/min — ABNORMAL LOW (ref >60)
GFR calc non Af Amer: 17 mL/min — ABNORMAL LOW (ref >60)
Glucose, Bld: 205 mg/dL — ABNORMAL HIGH (ref 70–99)
Potassium: 5.8 mmol/L — ABNORMAL HIGH (ref 3.5–5.1)
Sodium: 133 mmol/L — ABNORMAL LOW (ref 135–145)

## 2020-06-12 LAB — BLOOD GAS, ARTERIAL
Acid-base deficit: 6 mmol/L — ABNORMAL HIGH (ref 0.0–2.0)
Bicarbonate: 21.7 mmol/L (ref 20.0–28.0)
FIO2: 1
O2 Saturation: 91.5 %
Patient temperature: 98.6
pCO2 arterial: 62.5 mmHg — ABNORMAL HIGH (ref 32.0–48.0)
pH, Arterial: 7.166 — CL (ref 7.350–7.450)
pO2, Arterial: 76.4 mmHg — ABNORMAL LOW (ref 83.0–108.0)

## 2020-06-12 LAB — GLUCOSE, CAPILLARY
Glucose-Capillary: 162 mg/dL — ABNORMAL HIGH (ref 70–99)
Glucose-Capillary: 174 mg/dL — ABNORMAL HIGH (ref 70–99)
Glucose-Capillary: 129 mg/dL — ABNORMAL HIGH (ref 70–99)
Glucose-Capillary: 116 mg/dL — ABNORMAL HIGH (ref 70–99)
Glucose-Capillary: 117 mg/dL — ABNORMAL HIGH (ref 70–99)

## 2020-06-12 LAB — PREPARE RBC (CROSSMATCH)

## 2020-06-12 LAB — HEMOGLOBIN AND HEMATOCRIT, BLOOD
HCT: 25.9 % — ABNORMAL LOW (ref 36.0–46.0)
Hemoglobin: 8.5 g/dL — ABNORMAL LOW (ref 12.0–15.0)

## 2020-06-12 LAB — APTT: aPTT: 36 seconds (ref 24–36)

## 2020-06-12 LAB — FIBRINOGEN: Fibrinogen: 730 mg/dL — ABNORMAL HIGH (ref 210–475)

## 2020-06-12 LAB — D-DIMER, QUANTITATIVE: D-Dimer, Quant: 2.25 ug/mL-FEU — ABNORMAL HIGH (ref 0.00–0.50)

## 2020-06-12 LAB — TRIGLYCERIDES: Triglycerides: 236 mg/dL — ABNORMAL HIGH (ref <150)

## 2020-06-12 MED ORDER — SODIUM CHLORIDE 0.9% IV SOLUTION: Freq: Once | INTRAVENOUS | Status: AC

## 2020-06-12 MED ORDER — NOREPINEPHRINE 4 MG/250ML-% IV SOLN
0.0000 ug/min | INTRAVENOUS | Status: DC
  Administered 2020-06-12: 2 ug/min via INTRAVENOUS
  Administered 2020-06-13: 5 ug/min via INTRAVENOUS
  Filled 2020-06-12 (×3): qty 250

## 2020-06-12 MED ORDER — SODIUM BICARBONATE 8.4 % IV SOLN
50.0000 meq | Freq: Once | INTRAVENOUS | Status: AC
  Administered 2020-06-12: 50 meq via INTRAVENOUS
  Filled 2020-06-12: qty 50

## 2020-06-12 MED ORDER — VITAL AF 1.2 CAL PO LIQD
1000.0000 mL | ORAL | Status: DC
  Administered 2020-06-12: 1000 mL

## 2020-06-12 MED ORDER — VALGANCICLOVIR HCL 450 MG PO TABS: 450.0000 mg | ORAL_TABLET | ORAL | Status: DC

## 2020-06-12 MED ORDER — FUROSEMIDE 10 MG/ML IJ SOLN
40.0000 mg | Freq: Once | INTRAMUSCULAR | Status: AC
  Administered 2020-06-12: 40 mg via INTRAVENOUS
  Filled 2020-06-12: qty 4

## 2020-06-12 NOTE — Progress Notes (Signed)
[facility] Progress Note Patient Name: Audrey Gross DOB: [birth year] MRN: [id number]   Date of Service  06/12/2020  HPI/Events of Note  please review am labs  HGB- 5.9 platelets -15 K-5.8 >>per note by Marni Griffon NP  9/2, will cont aggressive care  however will not add or escalate care.  if suffer cardiac arrest will not perform CPR   eICU Interventions  - 2 PRBC transfusion - follow Platelets, if < 10 K need transfusion. No melena. - follow K level at 5 AM.  Follow fibrinogen level. - HIT neg on 2 nd. - d dimer ok.  Poor prognosis.   Discussed with bed side RN.      Intervention Category Intermediate Interventions: Diagnostic test evaluation;Other:  Elmer Sow 06/12/2020, 3:58 AM

## 2020-06-12 NOTE — Progress Notes (Signed)
[facility] Progress Note Patient Name: Audrey Gross DOB: [birth year] MRN: [id number]   Date of Service  06/12/2020  HPI/Events of Note  ABG: type 2 failure, mainly hypercarbic. Could have metabolic component from renal failure.  CxR: Stable air space densities.  Discussed with bed side RN. Twice prone in past did not help.  sats 84%. 100% fio2, rate > 32 already. PEEP 12. No pneumothorax.    eICU Interventions  - ordered Lasix 40 mg IV/bicarb 50 once. - follow BMP/CBC in AM, ordered - poor prognosis. DNR - not on VTE Lovenox or heparin due to low platelet count of 24. - consider  d dimer. If elevated could consider argatroban?.      Intervention Category Intermediate Interventions: Diagnostic test evaluation  Elmer Sow 06/12/2020, 1:25 AM

## 2020-06-12 NOTE — Progress Notes (Signed)
Patient's UOP has been very minimal today, totaling 20 mLs this shift. CCM aware.   Patient's O2 has also remained low on 100% FiO2; staying between 80-83%.   BP also trending down even with decreased sedation; BP currently 101/41 (55) per A-line, and 131/55 (79) on LLE.   Heart rhythm continues to be very irritable, with frequent PVCs.

## 2020-06-12 NOTE — Progress Notes (Signed)
At 1220, Pt suddenly became very hypertensive, with O2 desaturation to 79% on 100% FiO2. This RN gave 50 mcg Fentanyl bolus and 2 mg of versed, per PRN orders. SBP decreased to 120's, and O2 sat increased to 85%. This RN will continue to carefully monitor pt.

## 2020-06-12 NOTE — Progress Notes (Signed)
Spoke w/ husband. I have nothing else to add.  At this point I would recommend that we consider transitioning to comfort. I do not think she will survive. I think if this is going to be the case it might be more dignified for this to occur w/ family present. He is going to reach out to his family and we will revisit this daily.   Erick Colace ACNP-BC [place] Pager # [phone] OR # [phone] if no answer

## 2020-06-12 NOTE — Progress Notes (Signed)
Pt oxygen saturation in low 80's, boluses of Fentanyl x2 given, versed 2mg  push given as well.  Pt synchronous with the vent however still low 80's. E-link notified, MD orders to check ABG and chest x-ray.

## 2020-06-12 NOTE — Progress Notes (Signed)
Late entry for 14:30 Patient with increased BP and asynchrony with ventilator. Fentanyl bolus x 2 with little response, with interim rate increase to 350 mcg/hr. Versed prn 2 mg dose administered due to continued BP elevation following propofol increase to 55 mcg/kg/min. Cindy S. Brigitte Pulse BSN, RN, [place] 06/12/2020 3:30 PM

## 2020-06-12 NOTE — Progress Notes (Addendum)
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 Was admitted through 8/7 and discharged to home, but was re-admitted 8/15 with worsening respiratory distress. She remained dyspneic despite 40L HFNC and PCCM was consulted 8/17.  Past Medical History   has a past medical history of Atrial fibrillation ([place]), Cystic breast, Depression, Diffuse cystic mastopathy, Dyslipidemia (high LDL; low HDL), Hypertension, Iron deficiency anemia, unspecified, Lymphadenopathy of head and neck (04/05/2015), Medical non-compliance, Mitral regurgitation (02/2017), and Persistent atrial fibrillation with rapid ventricular response ([place]) (07/15/2017).  Significant Hospital Events   8/1 >8/5 admit for COVID 19 8/15 re-admitted with worsening hypoxia 8/17 PCCM consult.  Course from 8/17 to 8/30: elevated trop I-->these normalized. NAGMA resolved.  She was given a repeat 5-day course of IV remdesivir, completed on 8/20. She completed 5-day course of antibiotics because of elevated procalcitonin. Treated for esophagitis. She remains on baricitinib since 8/17, a maximum of 14 days Currently on Solu-Medrol IV 40 mg twice daily. On 8/30 developed sig worsening resp failure. RR 60s. sats 60s. Marked work of breathing. New lucency mediastinum. Intubated. ABX resumed.  IV Lasix 8/31: Very poor lung compliance, but tidal volume reduced to 5 cc/kg predicted body weight.  Had to go back to 6cc/kg d/t sig hypercarbia. Stable pneumomediastinum.  Diffuse bilateral airspace disease persists.  Pulse oximetry remaining above 85% on PEEP 8/FiO2 50%.  Requiring as needed neuromuscular blockade; attempting prone positioning. Added bicarb gtt in afternoon to compensate for respiratory acidosis and keep Ph > 7.2 Had  episode of SVT w/ HR 248. Converted on own just as cardioversion pads being put in place.  9/1: two more episodes of narrow complex tachycardia over night. Back in supine position.  Has moderate to large pneumomediastinum this was evaluated by CT scan.  There was no pneumothorax.  Tidal volume changed to 5 cc/kg predicted body weight with limit PEEP again at 8, trying to decrease risk of pneumomediastinum worsening.  Also added neuromuscular blockade.  Was placed on amiodarone infusion for recurrent SVT.  [name] 9/2: Not much change, currently in the prone position.  Stopping vancomycin as MRSA cultures all negative Made her DNR based on little to add if arrests 9/3 off pressors, pneumomediastinum a little better. Vent setting: ## Stopping abx as nothing grew in cultures.  Stopping neuromuscular blockade. 9/5-ventilator dyssynchrony requiring dose of paralytic 9/7: Worsening hypercarbia.  Increased oxygen requirements.  Hemoglobin down to 5.9 getting blood.  Continues to require high FiO2 and PEEP.Renal function worse. Consults:  PCCM  Procedures:  oett 8/30>>> Right internal jugular vein triple-lumen catheter placed 9/1  Significant Diagnostic Tests:    Micro Data:  resp culture 8/30>>>neg  Antimicrobials:  Vancomycin 8/15 > 8/17 Azithromycin 8/15> off Cefepime 8/15 > off Bactrim 8/15 > 8/30 vanc>>> 9/2 Cefepime 8/30>>>9/3  Interim history/subjective:   Requiring transfusion Objective   Blood pressure (Abnormal) 151/55, pulse 71, temperature (Abnormal) 97.5 F (36.4 C), resp. rate (Abnormal) 32, height 5\' 7"  (1.702 m), weight 80 kg, last menstrual period 02/03/2017, SpO2 (Abnormal) 85 %.    Vent Mode: PRVC FiO2 (%):  [100 %] 100 % Set Rate:  [3 bmp-32 bmp] 3 bmp Vt Set:  [370 mL] 370 mL PEEP:  [10 [id number]  [place] Pressure:  [27 cmH20-36 cmH20] 34 cmH20   Intake/Output Summary (Last 24 hours) at 06/12/2020 0811 Last  data filed at 06/12/2020 0600 Gross per 24 hour  Intake 4214.16 ml  Output 93 ml  Net 4121.16 ml   Filed Weights   06/08/20 0500 06/09/20 0628 06/10/20 0538  Weight: 76.6 kg 79.6 kg 80 kg    Physical Exam: General critically ill 59 year old female still on full support HENT NCAT the right IJ CVL still has old blood at insertion site. Orally intubated pulm equal chest rise decreased bilaterally. Pplat 30 Card rrr  abd not tender tol TF gu cl yellow but UOP decreased Neuro sedated.  Resolved Hospital Problem list   Drug-related hypotension  Assessment & Plan:   Acute hypoxic respiratory failure secondary to COVID-19 pneumonia, ARDS, fibrotic disease Continue mechanical ventilation per ARDS protocol Portable chest x-ray personally reviewed: Endotracheal tube is below the thoracic vent not, right port and right IJ triple-lumen catheter in satisfactory position diffuse bilateral airspace disease persists pneumomediastinum stable -Current ventilator requirements remain high. PEEP 12/fio2 100% Plan Continue full ventilatory support with target tidal volume 6 to 8 cc/kg predicted body weight Plateau pressure goal less than 30, driving pressure goal less than 15, this is been difficult to achieve Target PaO2 goal 55 to 65%, Continuing prone positioning for 16 hours a day as long as PE/F ratio less than 150 Continue diuresis as long as BUN/creatinine allow VAP bundle PAD protocol A.m. chest x-ray   Pneumomediastinum -Stable on x-ray Plan Continue mechanical ventilation Serial chest x-rays  Metabolic encephalopathy: Has underlying history of significant anxiety Plan Continue supportive care PAD protocol, RASS goal -2 to -3   Paroxysmal atrial fibrillation Plan Keep potassium greater than 4 and magnesium greater than 2  Hypertension Plan As needed hydralazine  Acute kidney injury: Serum creatinines continues to climb.  She is not a candidate for dialysis  given her  multiple comorbidities, progressive critical illness, and poor prognosis. Plan Continuing supportive care Treat hyperkalemia Will update family  History of CLL Plan  chemotherapy on hold prophylactic  Bactrim and valganciclovir, with Bactrim on hold  Anemia of critical illness: Hemoglobin down to 5.9 reflecting more than a gram drop in 24 hours.  No active evidence of bleeding however no melena no bleeding from insertion sites. Plan Getting 2 units of blood Follow-up CBC Holding anticoagulation PPI twice daily  Worsening thrombocytopenia Etiology is not certain.  Likely multifactorial: Drug, acute illness.  HIT negative Plan Holding low molecular weight heparin  Serial CBC  Transfuse for platelets less than 10    Best practice:  Diet: Tube feeds Pain/Anxiety/Delirium protocol (if indicated): Propofol, fentanyl VAP protocol (if indicated): In place  DVT prophylaxis: GI prophylaxis: NA Glucose control:SSI Mobility: BR Code Status: DNR if arrests  Family Communication: team will update family  Disposition: ICU  cct 34 min  Erick Colace ACNP-BC [place] Pager # [phone] OR # [phone] if no answer

## 2020-06-12 NOTE — Progress Notes (Signed)
Dr. Vaughan Browner notified by this RN of SBP sustaining in the 90s. (Pt remained hypertensive all day yesterday and this AM) Orders received for levophed gtt. This RN will continue to carefully monitor pt.

## 2020-06-12 NOTE — Progress Notes (Addendum)
Nutrition Follow-up  DOCUMENTATION CODES:   Not applicable  INTERVENTION:  - will adjust TF regimen: Vital AF 1.2 @ 60 ml/hr with 45 ml Prosource TF BID and 100 ml free water TID.  - this regimen + kcal from current propofol rate will provide 2278 kcal, 130 grams protein, and 1468 ml free water.  - will monitor GOC.  NUTRITION DIAGNOSIS:   Increased nutrient needs related to  (COVID-19 infection) as evidenced by estimated needs. -ongoing  GOAL:   Patient will meet greater than or equal to 90% of their needs -met with TF regimen  MONITOR:   Vent status, TF tolerance, Labs, Weight trends  ASSESSMENT:   59 y.o. 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 She was discharged from the hospital on 8/7 after treatment for COVID-19. She has not been vacinnated. Patient presented to the ED with SOB, fever, and ongoing productive cough.  Patient remains intubated since 8/30. Small bore NGT was placed on 8/30 and removed 9/4; replaced with OGT on 9/4. She is currently ordered Vital High Protein @ 60 ml/hr with 45 ml Prosource TF BID and 100 ml free water TID. This regimen is providing 1520 kcal, 148 grams protein, and 1504 ml free water.   She was last weighed on 9/5 at which time she weighed 80 kg/176 lb. Weight has fluctuated throughout admission. Mild edema to all extremities noted in flow sheet.   Used weight on 9/3 (76.6 kg) to re-estimate needs based on ASPEN guidelines for COVID-19 positive patients.   Patient was discussed in rounds this AM. Notes also reviewed. Patient noted to not be a candidate for dialysis.   Patient is currently intubated on ventilator support MV: 11.1 L/min Temp (24hrs), Avg:98 F (36.7 C), Min:96.6 F (35.9 C), Max:100.2 F (37.9 C) Propofol: 17.79 ml/hr (470 kcal)  Labs reviewed; CBGs: 162, 174, and 129 mg/dl, Na: 133 mmol/l, K: 5.8 mmol/l, Cl: 90  mmol/l, BUN: 136 mg/dl, creatinine: 2.89 mg/dl, Ca: 7.6 mg/dl, GFR: 20 ml/min.  Medications reviewed; 100 mg colace BID, 40 mg IV lasix x1 dose today, sliding scale novolog, 5 units levemir BID, 40 mg solu-medrol BID, 17 g miralax/day.  IVF; 1/2-NS @ 75 ml/hr  Drips; fentanyl @ 300 mcg/hr, propofol @ 45 mcg/kg/min    NUTRITION - FOCUSED PHYSICAL EXAM:  did not complete at this time.   Diet Order:   Diet Order    None      EDUCATION NEEDS:   No education needs have been identified at this time  Skin:  Skin Assessment: Reviewed RN Assessment  Last BM:  9/7  Height:   Ht Readings from Last 1 Encounters:  06/09/20 _0  (1.702 m)    Weight:   Wt Readings from Last 1 Encounters:  06/10/20 80 kg     Estimated Nutritional Needs:  Kcal:  2300 kcal Protein:  113-134 grams (1.7-2 grams/kg) Fluid:  >/= 2.1 L/day     Jarome Matin, MS, RD, LDN, CNSC Inpatient Clinical Dietitian RD pager # available in [facility]  After hours/weekend pager # available in [facility]

## 2020-06-13 DIAGNOSIS — J9602 Acute respiratory failure with hypercapnia: Secondary | ICD-10-CM

## 2020-06-13 LAB — CBC WITH DIFFERENTIAL/PLATELET
Abs Immature Granulocytes: 0.01 10*3/uL (ref 0.00–0.07)
Basophils Absolute: 0.1 10*3/uL (ref 0.0–0.1)
Basophils Relative: 1 %
Eosinophils Absolute: 0 10*3/uL (ref 0.0–0.5)
Eosinophils Relative: 0 %
HCT: 23.8 % — ABNORMAL LOW (ref 36.0–46.0)
Hemoglobin: 7.8 g/dL — ABNORMAL LOW (ref 12.0–15.0)
Immature Granulocytes: 0 %
Lymphocytes Relative: 71 %
Lymphs Abs: 10.2 10*3/uL — ABNORMAL HIGH (ref 0.7–4.0)
MCH: 29.8 pg (ref 26.0–34.0)
MCHC: 32.8 g/dL (ref 30.0–36.0)
MCV: 90.8 fL (ref 80.0–100.0)
Monocytes Absolute: 3.7 10*3/uL — ABNORMAL HIGH (ref 0.1–1.0)
Monocytes Relative: 26 %
Neutro Abs: 0.2 10*3/uL — ABNORMAL LOW (ref 1.7–7.7)
Neutrophils Relative %: 2 %
Platelets: 10 10*3/uL — CL (ref 150–400)
RBC: 2.62 MIL/uL — ABNORMAL LOW (ref 3.87–5.11)
RDW: 16.1 % — ABNORMAL HIGH (ref 11.5–15.5)
WBC: 14.2 10*3/uL — ABNORMAL HIGH (ref 4.0–10.5)
nRBC: 4.4 % — ABNORMAL HIGH (ref 0.0–0.2)

## 2020-06-13 LAB — BPAM RBC
Blood Product Expiration Date: 202110012359
Blood Product Expiration Date: 202110032359
ISSUE DATE / TIME: 202109070635
ISSUE DATE / TIME: 202109070906
Unit Type and Rh: 6200
Unit Type and Rh: 6200

## 2020-06-13 LAB — BASIC METABOLIC PANEL
Anion gap: 17 — ABNORMAL HIGH (ref 5–15)
BUN: 149 mg/dL — ABNORMAL HIGH (ref 6–20)
CO2: 21 mmol/L — ABNORMAL LOW (ref 22–32)
Calcium: 7.2 mg/dL — ABNORMAL LOW (ref 8.9–10.3)
Chloride: 89 mmol/L — ABNORMAL LOW (ref 98–111)
Creatinine, Ser: 3.2 mg/dL — ABNORMAL HIGH (ref 0.44–1.00)
GFR calc Af Amer: 18 mL/min — ABNORMAL LOW (ref >60)
GFR calc non Af Amer: 15 mL/min — ABNORMAL LOW (ref >60)
Glucose, Bld: 130 mg/dL — ABNORMAL HIGH (ref 70–99)
Potassium: 6.7 mmol/L (ref 3.5–5.1)
Sodium: 127 mmol/L — ABNORMAL LOW (ref 135–145)

## 2020-06-13 LAB — NA AND K (SODIUM & POTASSIUM), RAND UR
Potassium Urine: 23 mmol/L
Sodium, Ur: 89 mmol/L

## 2020-06-13 LAB — TYPE AND SCREEN
ABO/RH(D): A POS
Antibody Screen: NEGATIVE
Unit division: 0
Unit division: 0

## 2020-06-13 LAB — GLUCOSE, CAPILLARY: Glucose-Capillary: 199 mg/dL — ABNORMAL HIGH (ref 70–99)

## 2020-06-13 LAB — POTASSIUM
Potassium: 6.3 mmol/L (ref 3.5–5.1)
Potassium: 6.3 mmol/L (ref 3.5–5.1)

## 2020-06-13 LAB — TRIGLYCERIDES: Triglycerides: 265 mg/dL — ABNORMAL HIGH (ref <150)

## 2020-06-13 MED ORDER — INSULIN ASPART 100 UNIT/ML IV SOLN
5.0000 [IU] | Freq: Once | INTRAVENOUS | Status: AC
  Administered 2020-06-13: 5 [IU] via INTRAVENOUS

## 2020-06-13 MED ORDER — CALCIUM GLUCONATE-NACL 1-0.675 GM/50ML-% IV SOLN
1.0000 g | Freq: Once | INTRAVENOUS | Status: AC
  Administered 2020-06-13: 1000 mg via INTRAVENOUS
  Filled 2020-06-13: qty 50

## 2020-06-13 MED ORDER — SODIUM ZIRCONIUM CYCLOSILICATE 5 G PO PACK
5.0000 g | PACK | Freq: Once | ORAL | Status: DC
  Filled 2020-06-13: qty 1

## 2020-06-13 MED ORDER — CALCIUM GLUCONATE 10 % IV SOLN
1.0000 g | Freq: Once | INTRAVENOUS | Status: DC
  Filled 2020-06-13: qty 10

## 2020-06-13 MED ORDER — DEXTROSE 50 % IV SOLN
1.0000 | Freq: Once | INTRAVENOUS | Status: AC
  Administered 2020-06-13: 50 mL via INTRAVENOUS
  Filled 2020-06-13: qty 50

## 2020-06-13 MED ORDER — ALBUTEROL SULFATE (2.5 MG/3ML) 0.083% IN NEBU
10.0000 mg | INHALATION_SOLUTION | Freq: Once | RESPIRATORY_TRACT | Status: AC
  Administered 2020-06-13: 10 mg via RESPIRATORY_TRACT
  Filled 2020-06-13: qty 12

## 2020-06-13 MED ORDER — SODIUM BICARBONATE 8.4 % IV SOLN
50.0000 meq | Freq: Once | INTRAVENOUS | Status: AC
  Administered 2020-06-13: 50 meq via INTRAVENOUS
  Filled 2020-06-13: qty 50

## 2020-07-06 NOTE — Progress Notes (Signed)
CRITICAL VALUE ALERT  Critical Value:  k 6.3  Date & Time Notied:  2020-06-22 0630  Provider Notified: e-link  Orders Received/Actions taken: none at this time, RN will continue to monitor

## 2020-07-06 NOTE — Progress Notes (Signed)
CRITICAL VALUE ALERT  Critical Value:  k 6.7 plt 10 Date & Time Notied:  12-Jul-2020 0415 Provider Notified: e-link Orders Received/Actions taken: IV insulin, dextrose, bicarb & albuterol

## 2020-07-06 NOTE — Progress Notes (Signed)
Patient passed and vent turned to standby @ 1052. RN at bedside with Charge RN at this time.

## 2020-07-06 NOTE — TOC Progression Note (Signed)
Transition of Care [facility]) - Progression Note    Patient Details  Name: SHONNIE POUDRIER MRN: [id number] Date of Birth: 02/20/1961  [facility]) CM/SW Contact  Leeroy Cha, RN Phone Number: July 11, 2020, 9:09 AM  Clinical Narrative:    Remains on the vent at 100% fi02,iv cardene and aminodarone, iv sedation and pressors, k 6.3, hgb 7.8, wbc 14.2. Patient is transitioning to comfort care is not expected to survive plan is to wait for family to come so that decision can be made. Pt is a DRN.   Expected Discharge Plan: Home/Self Care Barriers to Discharge: Continued Medical Work up  Expected Discharge Plan and Services Expected Discharge Plan: Home/Self Care   Discharge Planning Services: CM Consult   Living arrangements for the past 2 months: Single Family Home                                       Social Determinants of Health (SDOH) Interventions    Readmission Risk Interventions No flowsheet data found.

## 2020-07-06 NOTE — Progress Notes (Signed)
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 Was admitted through 8/7 and discharged to home, but was re-admitted 8/15 with worsening respiratory distress. She remained dyspneic despite 40L HFNC and PCCM was consulted 8/17.  Past Medical History   has a past medical history of Atrial fibrillation ([place]), Cystic breast, Depression, Diffuse cystic mastopathy, Dyslipidemia (high LDL; low HDL), Hypertension, Iron deficiency anemia, unspecified, Lymphadenopathy of head and neck (04/05/2015), Medical non-compliance, Mitral regurgitation (02/2017), and Persistent atrial fibrillation with rapid ventricular response ([place]) (07/15/2017).  Significant Hospital Events   8/1 >8/5 admit for COVID 19 8/15 re-admitted with worsening hypoxia 8/17 PCCM consult.  Course from 8/17 to 8/30: elevated trop I-->these normalized. NAGMA resolved.  She was given a repeat 5-day course of IV remdesivir, completed on 8/20. She completed 5-day course of antibiotics because of elevated procalcitonin. Treated for esophagitis. She remains on baricitinib since 8/17, a maximum of 14 days Currently on Solu-Medrol IV 40 mg twice daily. On 8/30 developed sig worsening resp failure. RR 60s. sats 60s. Marked work of breathing. New lucency mediastinum. Intubated. ABX resumed.  IV Lasix 8/31: Very poor lung compliance, but tidal volume reduced to 5 cc/kg predicted body weight.  Had to go back to 6cc/kg d/t sig hypercarbia. Stable pneumomediastinum.  Diffuse bilateral airspace disease persists.  Pulse oximetry remaining above 85% on PEEP 8/FiO2 50%.  Requiring as needed neuromuscular blockade; attempting prone positioning. Added bicarb gtt in afternoon to compensate for respiratory acidosis and keep Ph > 7.2 Had  episode of SVT w/ HR 248. Converted on own just as cardioversion pads being put in place.  9/1: two more episodes of narrow complex tachycardia over night. Back in supine position.  Has moderate to large pneumomediastinum this was evaluated by CT scan.  There was no pneumothorax.  Tidal volume changed to 5 cc/kg predicted body weight with limit PEEP again at 8, trying to decrease risk of pneumomediastinum worsening.  Also added neuromuscular blockade.  Was placed on amiodarone infusion for recurrent SVT.  [name] 9/2: Not much change, currently in the prone position.  Stopping vancomycin as MRSA cultures all negative Made her DNR based on little to add if arrests 9/3 off pressors, pneumomediastinum a little better. Vent setting: ## Stopping abx as nothing grew in cultures.  Stopping neuromuscular blockade. 9/5-ventilator dyssynchrony requiring dose of paralytic 9/7: Worsening hypercarbia.  Increased oxygen requirements.  Hemoglobin down to 5.9 getting blood.  Continues to require high FiO2 and PEEP.Renal function worse. Renal function worse, pressor requirements escalated, hyperkalemic.  Appears to be actively dying Consults:  PCCM  Procedures:  oett 8/30>>> Right internal jugular vein triple-lumen catheter placed 9/1  Significant Diagnostic Tests:    Micro Data:  resp culture 8/30>>>neg  Antimicrobials:  Vancomycin 8/15 > 8/17 Azithromycin 8/15> off Cefepime 8/15 > off Bactrim 8/15 > 8/30 vanc>>> 9/2 Cefepime 8/30>>>9/3  Interim history/subjective:   Requiring transfusion Objective   Blood pressure (Abnormal) 80/33, pulse 64, temperature (Abnormal) 96.6 F (35.9 C), temperature source Bladder, resp. rate (Abnormal) 32, height 5\' 7"  (1.702 m), weight 95 kg, last menstrual period 02/03/2017, SpO2 96 %.    Vent Mode: PRVC FiO2 (%):  [100 %] 100 % Set Rate:  [32  bmp] 32 bmp Vt Set:  [370 mL] 370 mL PEEP:  [12 cmH20-14 cmH20] 14  cmH20 Plateau Pressure:  [34 cmH20-41 cmH20] 41 cmH20   Intake/Output Summary (Last 24 hours) at 06/29/20 1006 Last data filed at 06-29-2020 1004 Gross per 24 hour  Intake 4128.51 ml  Output 67 ml  Net 4061.51 ml   Filed Weights   06/09/20 0628 06/10/20 0538 2020-06-29 0500  Weight: 79.6 kg 80 kg 95 kg    Physical Exam: General this is a 59 year old black female who remains on high ventilatory, PEEP and FiO2 support.  She remains heavily sedated. HEENT normocephalic atraumatic orally intubated no JVD the right internal jugular vein triple-lumen catheter dressing is intact there is old bloody drainage Pulmonary: Diminished bilaterally.  Current plateau pressure 29, remains on PEEP 12 FiO2 100%. Cardiac: Regular rate and rhythm Abdomen: Soft nontender tolerating tube feeds Extremities: Warm dry brisk cap refill Neuro: Heavily sedated GU: Minimal urine output. Resolved Hospital Problem list   Drug-related hypotension  Assessment & Plan:   Acute hypoxic respiratory failure secondary to COVID-19 pneumonia, ARDS, fibrotic disease Continue mechanical ventilation per ARDS protocol No change in ventilatory mechanics or oxygen requirements.  Pulse oximetry 84% on maximum settings Plan Continue low tidal volume ventilation  Plateau pressure goal less than 30, driving pressure goal less than 15, these have been difficult to achieve  Target PaO2 55 to 65%  No longer using prone positioning  Continue to push diuresis as long as BUN/creatinine and blood pressure will allow, this is currently on hold  VAP bundle PAD protocol  A.m. chest x-ray  Have encouraged family to consider transition to comfort   Pneumomediastinum -Stable on x-ray Plan Intermittent chest x-ray  Undifferentiated shock with progressive MODS Plan Continue current norepinephrine. No further titration currently at 27 mics per minute Avoid antihypertensives Continue telemetry monitoring  Metabolic encephalopathy:  Has underlying history of significant anxiety Plan Continuing supportive care RASS goal -3  Acute kidney injury: Serum creatinines continues to climb.  She is not a candidate for dialysis  given her multiple comorbidities, progressive critical illness, and poor prognosis. ->cont to worsen  Plan Supportive care   Hyperkalemia Plan [place]   Paroxysmal atrial fibrillation Plan Potassium goal greater than 4 magnesium goal greater than 2, currently she is actually hyperkalemic from her renal failure   History of CLL Plan  Discontinue prophylactic care   Anemia of critical illness: Hemoglobin down to 5.9 reflecting more than a gram drop in 24 hours.  No active evidence of bleeding however no melena no bleeding from insertion sites.  She received 2 units of blood yesterday on 9/7 Plan Intermittent CBCs Given death is imminent further transfusions would be futile care  Worsening thrombocytopenia Etiology is not certain.  Likely multifactorial: Drug, acute illness.  HIT negative Plan Continue to hold low molecular weight heparin Not a candidate for transfusion    Best practice:  Diet: Tube feeds Pain/Anxiety/Delirium protocol (if indicated): Propofol, fentanyl VAP protocol (if indicated): In place  DVT prophylaxis: GI prophylaxis: NA Glucose control:SSI Mobility: BR Code Status: DNR if arrests  Family Communication: team will update family  Disposition: Remains critically ill, appears to be actively dying.  She is on multiple organ failure, requiring escalating pressor requirements.  Renal function continues to deteriorate.  Further escalation would be futile.  Family has been told multiple times that she will not survive.  We will notify her husband once again and encouraged them to try to come see her,  I doubt she will make it through the day  Erick Colace ACNP-BC [place] Pager # [phone] OR # [phone] if no answer

## 2020-07-06 NOTE — Death Summary Note (Signed)
DEATH SUMMARY   Patient Details  Name: Audrey Gross MRN: [id number] DOB: 07/26/1961  Admission/Discharge Information   Admit Date:  Jun 07, 2020  Date of Death: Date of Death: 2020/07/01  Time of Death: Time of Death: 01/14/51  Length of Stay: 15-Jan-2023  Referring Physician: Janith Lima, MD   Reason(s) for Hospitalization  Acute hypoxemic respiratory failure due to ARDS in setting of COVID pneumonia  Diagnoses  Preliminary cause of death: Shock due to refractory hypoxemia due to severe ARDS due to COVID 19 pneumonia complicated by acute renal failure Secondary Diagnoses (including complications and co-morbidities):  Active Problems:   CLL (chronic lymphocytic leukemia) (HCC)   PAF (paroxysmal atrial fibrillation) (HCC)   Nonischemic cardiomyopathy (HCC)   Chronic systolic heart failure (HCC)   Acute respiratory failure ([place])   COVID-19   Hypoxia   Brief Hospital Course (including significant findings, care, treatment, and services provided and events leading to death)  Audrey Gross is a 59 y.o. year old female who has PMH CLL on chemotherapy who tested positive for Covid on 05/04/2020 admitted to the hospital and subsequent discharged 05/12/2020 was readmitted 2020-06-07 with worsening ARDS due to Covid pneumonia.  Despite aggressive care and oxygen patient continued to decline and was subsequently intubated 8/17.  Developed worsening hypoxemia over the course of the admission and was intermittently proned.  She had stable pneumomediastinum.  Given her clinical worsening she was made DNR on 9/2.  She continued to require paralytics and her dead space worsened with worsening hypercarbia.  She was persistently hypotensive requiring pressors.  Her renal function continued to worsen despite aggressive measures.  Ultimately, she passed on full support the morning of 07/01/20.    Pertinent Labs and Studies  Significant Diagnostic Studies DG Abd 1 View  Result Date: 06/09/2020 CLINICAL DATA:  OG  tube placement. EXAM: ABDOMEN - 1 VIEW COMPARISON:  CT, 01/09/2020 FINDINGS: Orogastric tube passes well below the diaphragm, tip in the mid to distal stomach. Normal bowel gas pattern. Hazy opacities are noted at the lung bases. IMPRESSION: Well-positioned orogastric tube. Electronically Signed   By: Lajean Manes M.D.   On: 06/09/2020 12:30   CT CHEST WO CONTRAST  Result Date: 06/06/2020 CLINICAL DATA:  Respiratory failure, COVID pneumonia, ARDS, probable fibrosis, history of CLL EXAM: CT CHEST WITHOUT CONTRAST TECHNIQUE: Multidetector CT imaging of the chest was performed following the standard protocol without IV contrast. COMPARISON:  CT chest, 04/16/2020 FINDINGS: Cardiovascular: Right chest port catheter. Three-vessel coronary artery calcifications. Normal heart size. Enlargement of the main pulmonary artery measuring up to 4.3 cm. Trace pericardial effusion. Mediastinum/Nodes: Severe, extensive pneumomediastinum. No enlarged mediastinal, hilar, or axillary lymph nodes. Endotracheal intubation. Thyroid gland, trachea, and esophagus demonstrate no significant findings. Lungs/Pleura: There is diffuse ground-glass airspace opacity throughout the lungs with some areas of irregular consolidation, particularly at the lung bases, with some suggestion of subpleural sparing (series 5, image 82). There is mild, tubular bronchiectasis, predominantly seen throughout the lower lungs. A previously noted right lower lobe pulmonary nodule is obscured by airspace disease. Upper Abdomen: No acute abnormality. Musculoskeletal: No chest wall mass or suspicious bone lesions identified. Subcutaneous emphysema about the included lower neck. IMPRESSION: 1. Endotracheal intubation with severe, extensive pneumomediastinum. 2. There is diffuse ground-glass airspace opacity throughout the lungs with some areas of irregular consolidation, particularly at the lung bases, with some suggestion of subpleural sparing. Findings are  generally consistent with subacute multifocal COVID airspace disease, ARDS, and an organizing pneumonia pattern of developing  post infectious pulmonary fibrosis. 3. A previously noted right lower lobe pulmonary nodule is obscured by airspace disease. 4. Enlargement of the main pulmonary artery, as can be seen in pulmonary hypertension. 5. Coronary artery disease. Electronically Signed   By: Eddie Candle M.D.   On: 06/06/2020 13:27   DG CHEST PORT 1 VIEW  Result Date: 06/12/2020 CLINICAL DATA:  Hypoxemia.  COVID EXAM: PORTABLE CHEST 1 VIEW COMPARISON:  06/10/2020 FINDINGS: Endotracheal tube with tip measuring 7 cm above the carina. Right central venous catheter with tip over the mid SVC region. Right central venous pouch port catheter with tip over the right atrium. Enteric tube tip is off the field of view below the left hemidiaphragm. Cardiac enlargement. Perihilar and basilar airspace opacities compatible with multifocal pneumonia or edema. No effusions. No significant change since prior study. IMPRESSION: Bilateral perihilar and basilar airspace disease compatible with multifocal pneumonia or edema. No significant change. Electronically Signed   By: Lucienne Capers M.D.   On: 06/12/2020 00:51   DG Chest Port 1 View  Result Date: 06/10/2020 CLINICAL DATA:  Respiratory failure, COVID EXAM: PORTABLE CHEST 1 VIEW COMPARISON:  06/09/2020 FINDINGS: Endotracheal tube terminates 5.5 cm above the carina. Multifocal patchy opacities, lower lobe predominant. No definite pleural effusions. No pneumothorax. Cardiomegaly. Right chest port terminates the cavoatrial junction. Additional right IJ venous catheter terminates at the cavoatrial junction. Enteric tube courses into the stomach. IMPRESSION: Endotracheal tube terminates 5.5 cm above the carina. Additional support apparatus as above. Multifocal pneumonia in this patient with known COVID. Electronically Signed   By: Julian Hy M.D.   On: 06/10/2020 04:42    DG Chest Port 1 View  Result Date: 06/09/2020 CLINICAL DATA:  Respiratory failure EXAM: PORTABLE CHEST 1 VIEW COMPARISON:  06/08/2020 FINDINGS: Unchanged appearance of support apparatus. Persistent hazy opacities throughout both lungs. Mild cardiomegaly is unchanged. IMPRESSION: 1. Unchanged bilateral airspace opacities. 2. Unchanged support apparatus. Electronically Signed   By: Ulyses Jarred M.D.   On: 06/09/2020 05:21   DG Chest Port 1 View  Result Date: 06/08/2020 CLINICAL DATA:  Respiratory failure.  COVID. EXAM: PORTABLE CHEST 1 VIEW COMPARISON:  06/07/2020. FINDINGS: Endotracheal tube, feeding tube, right IJ line, Port-A-Cath in stable position. Pneumomediastinum again noted, improved from prior exam. Heart size stable. Low lung volumes. Diffuse bilateral pulmonary infiltrates are again noted without interim change. No pleural effusion or pneumothorax. Bilateral supraclavicular subcutaneous emphysema again noted. IMPRESSION: 1.  Lines and tubes in stable position. 2. Pneumomediastinum again noted, improved from prior exam. Bilateral supraclavicular subcutaneous emphysema again noted. No pneumothorax. 3. Low lung volumes. Diffuse bilateral pulmonary infiltrates again noted without interim change. Electronically Signed   By: Marcello Moores  Register   On: 06/08/2020 06:46   DG Chest Port 1 View  Result Date: 06/07/2020 CLINICAL DATA:  COVID positive. EXAM: PORTABLE CHEST 1 VIEW COMPARISON:  06/06/2020.  CT 06/06/2020. FINDINGS: Endotracheal tube, feeding tube, right IJ line, Port-A-Cath in stable position. A catheter is noted over the right chest. Severe pneumomediastinum again noted. Stable cardiomegaly. Diffuse severe bilateral pulmonary infiltrates are again noted. No pleural effusion or pneumothorax. Subcutaneous emphysema again noted bilaterally over the supraclavicular regions. IMPRESSION: 1. Endotracheal tube, feeding tube, right IJ line, Port-A-Cath in stable position. A catheter is noted over the  right chest. 2. Severe pneumomediastinum again noted. Subcutaneous emphysema again noted over the supraclavicular regions bilaterally. No pneumothorax noted. 3. Diffuse severe bilateral pulmonary infiltrates again noted without interim change. 4.  Stable cardiomegaly. Electronically Signed  ByMarcello Moores  Register   On: 06/07/2020 06:01   DG CHEST PORT 1 VIEW  Result Date: 06/06/2020 CLINICAL DATA:  59 year old with COVID-19 pneumonia and ventilator dependent respiratory failure. Bedside central venous catheter placement. EXAM: PORTABLE CHEST 1 VIEW 5:57 p.m.: COMPARISON:  Portable chest x-ray earlier same day at 11:05 a.m. and previously. FINDINGS: The new RIGHT jugular central venous catheter tip projects over the cavoatrial junction. No evidence of pneumothorax or mediastinal hematoma. The RIGHT jugular Port-A-Cath tip also projects at or near the cavoatrial junction. The endotracheal to tip is in satisfactory position projecting approximately 7 cm above carina. Feeding tube courses below the diaphragm into the stomach. Cardiac silhouette enlarged, unchanged. Patchy ground-glass airspace opacities throughout both lungs are unchanged since earlier today. No new pulmonary parenchymal abnormalities. Stable subcutaneous emphysema in both sides of the neck. IMPRESSION: 1. New RIGHT jugular central venous catheter tip projects over the cavoatrial junction. No acute complicating features. 2. Remaining support apparatus satisfactory. 3. Stable patchy ground-glass airspace opacities throughout both lungs, indicating COVID-19 pneumonia and/or ARDS. No new abnormalities. 4. Stable subcutaneous emphysema in both sides of the neck. Electronically Signed   By: Evangeline Dakin M.D.   On: 06/06/2020 18:18   DG Chest Port 1 View  Result Date: 06/06/2020 CLINICAL DATA:  Ventilator support.  Coronavirus infection. EXAM: PORTABLE CHEST 1 VIEW COMPARISON:  06/06/2020 FINDINGS: Endotracheal tube tip is 5 cm above the carina. Soft  feeding tube enters the abdomen. Power port on the right has its tip at the SVC RA junction. Widespread hazy and patchy pulmonary infiltrates persist without appreciable change. Pneumomediastinum as seen previously. Subcutaneous emphysema in the supraclavicular regions. Question if there is a small pneumothorax at the left apex. This is not definite. IMPRESSION: No change in diffuse hazy and patchy pulmonary infiltrates. Pneumomediastinum as seen previously. Question if there is a small pneumothorax at the left apex. This is not definite. Lines and tubes well positioned. Electronically Signed   By: Nelson Chimes M.D.   On: 06/06/2020 11:31   DG Chest Port 1 View  Result Date: 06/06/2020 CLINICAL DATA:  Respiratory failure.  COVID positive. EXAM: PORTABLE CHEST 1 VIEW COMPARISON:  Multiple recent previous exams. FINDINGS: 0529 hours. Endotracheal tube tip is 4.5 cm above the base of the carina. A feeding tube passes into the stomach although the distal tip position is not included on the film. Right Port-A-Cath remains in place. Interval progression of diffuse airspace disease in the left lung with similar interstitial and patchy airspace opacity on the right. Pneumomediastinum again noted with evidence of subcutaneous emphysema in the supraclavicular regions bilaterally. Bones are diffusely demineralized. Telemetry leads overlie the chest. IMPRESSION: 1. Interval progression of diffuse airspace disease in the left lung. 2. Persistent pneumomediastinum and subcutaneous emphysema in the supraclavicular regions bilaterally. Electronically Signed   By: Misty Stanley M.D.   On: 06/06/2020 07:53   DG Chest Port 1 View  Result Date: 06/05/2020 CLINICAL DATA:  Respiratory failure.  COVID. EXAM: PORTABLE CHEST 1 VIEW COMPARISON:  06/04/2020. FINDINGS: Endotracheal tube, feeding tube, PowerPort catheter stable position. Stable pneumomediastinum. Stable cardiomegaly. Diffuse bilateral pulmonary infiltrates/edema.  Bibasilar atelectasis again. No pleural effusion or pneumothorax. IMPRESSION: 1. Lines and tubes in stable position. Stable pneumomediastinum. No pneumothorax. 2.  Stable cardiomegaly. 3. Diffuse bilateral pulmonary infiltrates/edema again noted without interim change. Bibasilar atelectasis again noted. Electronically Signed   By: [place]   On: 06/05/2020 06:02   DG Chest Port 1 View  Result Date:  06/04/2020 CLINICAL DATA:  06/04/2020 EXAM: PORTABLE CHEST 1 VIEW COMPARISON:  06/04/2020 FINDINGS: Similar appearance of diffuse bilateral hazy airspace opacities. Similar cardiopericardial silhouette enlargement. Similar lucency along the left mediastinum and left cardiac silhouette, suggestive of pneumomediastinum. Similar positioning of a right Port-A-Cath. Interval intubation with the endotracheal tube tip approximately 4 cm above the carina. Resulting improved lung volumes. Gastric tube placement, which courses below the diaphragm in outside the field of view. IMPRESSION: 1. Interval intubation with the endotracheal tube tip approximately 4 cm above the carina. 2. Gastric tube placement, which courses below the diaphragm in outside the field of view. 3. Similar appearance of diffuse bilateral hazy airspace opacities and findings consistent with pneumomediastinum. Electronically Signed   By: Margaretha Sheffield MD   On: 06/04/2020 10:56   DG Chest Port 1 View  Result Date: 06/04/2020 CLINICAL DATA:  Acute respiratory failure. EXAM: PORTABLE CHEST 1 VIEW COMPARISON:  Earlier same day FINDINGS: 0902 hours. Similar appearance of the diffuse bilateral hazy airspace opacity. The cardio pericardial silhouette is enlarged. Stable appearance of apparent pneumomediastinum. Right Port-A-Cath remains in place. The visualized bony structures of the thorax show now acute abnormality. IMPRESSION: Similar appearance of the chest with diffuse bilateral hazy airspace opacity and findings consistent with  pneumomediastinum. Electronically Signed   By: Misty Stanley M.D.   On: 06/04/2020 10:03   DG CHEST PORT 1 VIEW  Result Date: 06/04/2020 CLINICAL DATA:  COVID-19 pneumonia. EXAM: PORTABLE CHEST 1 VIEW COMPARISON:  05/28/2020 FINDINGS: 0550 hours. The cardio pericardial silhouette is enlarged. Diffuse bilateral hazy airspace disease persists, similar to prior. Linear lucencies overlying the mediastinum raise the question of small volume pneumomediastinum. Right Port-A-Cath remains in place. The visualized bony structures of the thorax show now acute abnormality. Telemetry leads overlie the chest. IMPRESSION: 1. Persistent diffuse bilateral hazy airspace disease, not substantially changed. 2. Linear lucencies overlying the mediastinum suggest small volume pneumomediastinum. Electronically Signed   By: Misty Stanley M.D.   On: 06/04/2020 07:34   DG Chest Port 1 View  Result Date: 05/28/2020 CLINICAL DATA:  Pneumonia due to COVID 19 virus. EXAM: PORTABLE CHEST 1 VIEW COMPARISON:  One-view chest x-ray 05/22/2020 FINDINGS: Right IJ Port-A-Cath is stable. Heart size is exaggerated by low lung volumes. Increasing interstitial and airspace opacities are present bilaterally. IMPRESSION: Increasing interstitial and airspace opacities bilaterally consistent with worsening multifocal pneumonia. Electronically Signed   By: San Morelle M.D.   On: 05/28/2020 06:23   DG CHEST PORT 1 VIEW  Result Date: 05/22/2020 CLINICAL DATA:  COVID positive with increasing shortness of breath. EXAM: PORTABLE CHEST 1 VIEW COMPARISON:  Radiograph 05/21/2020.  CT 04/16/2020 FINDINGS: Right chest port remains in place. Patchy bilateral heterogeneous airspace opacities with mild progression over the past 2 days. No pneumothorax or pleural effusion. Stable heart size and mediastinal contours. Stable osseous structures. IMPRESSION: Patchy bilateral heterogeneous airspace opacities with mild progression over the past 2 days, pattern  consistent with COVID pneumonia. Electronically Signed   By: Keith Rake M.D.   On: 05/22/2020 20:10   DG Chest Portable 1 View  Result Date: 06/05/2020 CLINICAL DATA:  COVID positive, cough EXAM: PORTABLE CHEST 1 VIEW COMPARISON:  05/11/2020 FINDINGS: Right chest wall port catheter is unchanged. There are increased bilateral opacities primarily involving mid and lower lungs. No significant pleural effusion. Normal heart size. IMPRESSION: Progression of pneumonia since 05/11/2020. Electronically Signed   By: Macy Mis M.D.   On: 05/29/2020 11:22   ECHOCARDIOGRAM COMPLETE  Result Date:  05/21/2020    ECHOCARDIOGRAM REPORT   Patient Name:   VALERIE CONES Date of Exam: 05/21/2020 Medical Rec #:  [id number]     Height:       67.0 in Accession #:    [id number]    Weight:       195.0 lb Date of Birth:  [birth year]     BSA:          2.001 m Patient Age:    20 years      BP:           160/129 mmHg Patient Gender: F             HR:           58 bpm. Exam Location:  Inpatient Procedure: 2D Echo Indications:    CHF 428  History:        Patient has prior history of Echocardiogram examinations, most                 recent 01/31/2019. Arrythmias:Atrial Fibrillation; Risk                 Factors:Hypertension and Dyslipidemia. Hx of cardioversion.                 covid +.  Sonographer:    Mikki Santee RDCS (AE) Referring Phys: [phone] A CALDWELL POWELL JR  Sonographer Comments: Image acquisition challenging due to respiratory motion. patient on Bi-Pap during exam IMPRESSIONS  1. Left ventricular ejection fraction, by estimation, is 60 to 65%. The left ventricle has normal function. The left ventricle has no regional wall motion abnormalities. There is mild left ventricular hypertrophy. Left ventricular diastolic parameters were normal.  2. Right ventricular systolic function is normal. The right ventricular size is normal.  3. The mitral valve is normal in structure. Trivial mitral valve regurgitation. No evidence  of mitral stenosis.  4. The aortic valve is tricuspid. Aortic valve regurgitation is trivial. Mild to moderate aortic valve sclerosis/calcification is present, without any evidence of aortic stenosis.  5. The inferior vena cava is normal in size with greater than 50% respiratory variability, suggesting right atrial pressure of 3 mmHg. FINDINGS  Left Ventricle: Left ventricular ejection fraction, by estimation, is 60 to 65%. The left ventricle has normal function. The left ventricle has no regional wall motion abnormalities. The left ventricular internal cavity size was normal in size. There is  mild left ventricular hypertrophy. Left ventricular diastolic parameters were normal. Right Ventricle: The right ventricular size is normal. No increase in right ventricular wall thickness. Right ventricular systolic function is normal. Left Atrium: Left atrial size was normal in size. Right Atrium: Right atrial size was normal in size. Pericardium: There is no evidence of pericardial effusion. Mitral Valve: The mitral valve is normal in structure. There is mild thickening of the mitral valve leaflet(s). There is mild calcification of the mitral valve leaflet(s). Normal mobility of the mitral valve leaflets. Mild mitral annular calcification. Trivial mitral valve regurgitation. No evidence of mitral valve stenosis. Tricuspid Valve: The tricuspid valve is normal in structure. Tricuspid valve regurgitation is trivial. No evidence of tricuspid stenosis. Aortic Valve: The aortic valve is tricuspid. Aortic valve regurgitation is trivial. Mild to moderate aortic valve sclerosis/calcification is present, without any evidence of aortic stenosis. Pulmonic Valve: The pulmonic valve was not well visualized. Pulmonic valve regurgitation is trivial. No evidence of pulmonic stenosis. Aorta: The aortic root is normal in size and structure. Venous: The inferior vena cava is  normal in size with greater than 50% respiratory variability,  suggesting right atrial pressure of 3 mmHg. IAS/Shunts: No atrial level shunt detected by color flow Doppler.  LEFT VENTRICLE PLAX 2D LVIDd:         4.60 cm  Diastology LVIDs:         2.90 cm  LV e' medial:   7.07 cm/s LV PW:         1.10 cm  LV E/e' medial: 10.8 LV IVS:        1.10 cm LVOT diam:     2.20 cm LV SV:         63 LV SV Index:   32 LVOT Area:     3.80 cm  RIGHT VENTRICLE RV S prime:     14.50 cm/s TAPSE (M-mode): 2.1 cm LEFT ATRIUM           Index       RIGHT ATRIUM           Index LA diam:      3.10 cm 1.55 cm/m  RA Area:     13.70 cm LA Vol (A4C): 33.7 ml 16.84 ml/m RA Volume:   26.40 ml  13.20 ml/m  AORTIC VALVE LVOT Vmax:   91.70 cm/s LVOT Vmean:  59.100 cm/s LVOT VTI:    0.167 m  AORTA Ao Root diam: 3.00 cm MITRAL VALVE MV Area (PHT): 2.87 cm    SHUNTS MV Decel Time: 264 msec    Systemic VTI:  0.17 m MV E velocity: 76.60 cm/s  Systemic Diam: 2.20 cm MV A velocity: 72.70 cm/s MV E/A ratio:  1.05 Jenkins Rouge MD Electronically signed by Jenkins Rouge MD Signature Date/Time: 05/21/2020/4:01:06 PM    Final    VAS [place] LOWER EXTREMITY VENOUS (DVT)  Result Date: 05/21/2020  Lower Venous DVTStudy Indications: Elevated Ddimer.  Risk Factors: COVID 19 positive. Comparison Study: No prior studies. Performing Technologist: Oliver Hum RVT  Examination Guidelines: A complete evaluation includes B-mode imaging, spectral Doppler, color Doppler, and power Doppler as needed of all accessible portions of each vessel. Bilateral testing is considered an integral part of a complete examination. Limited examinations for reoccurring indications may be performed as noted. The reflux portion of the exam is performed with the patient in reverse Trendelenburg.  +---------+---------------+---------+-----------+----------+--------------+ RIGHT    CompressibilityPhasicitySpontaneityPropertiesThrombus Aging +---------+---------------+---------+-----------+----------+--------------+ CFV      Full           Yes       Yes                                 +---------+---------------+---------+-----------+----------+--------------+ SFJ      Full                                                        +---------+---------------+---------+-----------+----------+--------------+ FV Prox  Full                                                        +---------+---------------+---------+-----------+----------+--------------+ FV Mid   Full                                                        +---------+---------------+---------+-----------+----------+--------------+  FV DistalFull                                                        +---------+---------------+---------+-----------+----------+--------------+ PFV      Full                                                        +---------+---------------+---------+-----------+----------+--------------+ POP      Full           Yes      Yes                                 +---------+---------------+---------+-----------+----------+--------------+ PTV      Full                                                        +---------+---------------+---------+-----------+----------+--------------+ PERO     Full                                                        +---------+---------------+---------+-----------+----------+--------------+   +---------+---------------+---------+-----------+----------+--------------+ LEFT     CompressibilityPhasicitySpontaneityPropertiesThrombus Aging +---------+---------------+---------+-----------+----------+--------------+ CFV      Full           Yes      Yes                                 +---------+---------------+---------+-----------+----------+--------------+ SFJ      Full                                                        +---------+---------------+---------+-----------+----------+--------------+ FV Prox  Full                                                         +---------+---------------+---------+-----------+----------+--------------+ FV Mid   Full                                                        +---------+---------------+---------+-----------+----------+--------------+ FV DistalFull                                                        +---------+---------------+---------+-----------+----------+--------------+   PFV      Full                                                        +---------+---------------+---------+-----------+----------+--------------+ POP      Full           Yes      Yes                                 +---------+---------------+---------+-----------+----------+--------------+ PTV      Full                                                        +---------+---------------+---------+-----------+----------+--------------+ PERO     Full                                                        +---------+---------------+---------+-----------+----------+--------------+     Summary: RIGHT: - There is no evidence of deep vein thrombosis in the lower extremity.  - No cystic structure found in the popliteal fossa.  LEFT: - There is no evidence of deep vein thrombosis in the lower extremity.  - No cystic structure found in the popliteal fossa.  *See table(s) above for measurements and observations. Electronically signed by Monica Martinez MD on 05/21/2020 at 3:41:30 PM.    Final     Microbiology Recent Results (from the past 240 hour(s))  Culture, respiratory (non-expectorated)     Status: None   Collection Time: 06/04/20  3:18 PM   Specimen: Tracheal Aspirate; Respiratory  Result Value Ref Range Status   Specimen Description   Final    TRACHEAL ASPIRATE Performed at [facility] [address] [place], [place] [address]    Special Requests   Final    NONE Performed at [facility], [place] [address] [place], [place] [address]    Gram Stain   Final    RARE WBC  PRESENT, PREDOMINANTLY MONONUCLEAR NO ORGANISMS SEEN    Culture   Final    Consistent with normal respiratory flora. Performed at [facility], [place] [address], [place], [place] [address]    Report Status 06/07/2020 FINAL  Final    Lab Basic Metabolic Panel: Recent Labs  Lab 06/09/20 0435 06/09/20 0435 06/10/20 0453 06/10/20 0453 06/11/20 0230 06/12/20 0230 06/22/2020 0324 June 22, 2020 0512 June 22, 2020 0835  NA 139  --  137  --  133* 133* 127*  --   --   K 3.8   < > 4.5   < > 5.2* 5.8* 6.7* 6.3* 6.3*  CL 98  --  97*  --  94* 90* 89*  --   --   CO2 28  --  26  --  24 23 21*  --   --   GLUCOSE 249*  --  238*  --  141* 205* 130*  --   --   BUN 68*  --  92*  --  106* 136* 149*  --   --   CREATININE 1.06*  --  1.59*  --  2.08* 2.89* 3.20*  --   --   CALCIUM 7.8*  --  8.2*  --  8.3* 7.6* 7.2*  --   --   MG 1.5*  --   --   --   --   --   --   --   --    < > = values in this interval not displayed.   Liver Function Tests: Recent Labs  Lab 06/07/20 0243 06/09/20 0435  AST 91* 149*  ALT 167* 279*  ALKPHOS 49 71  BILITOT 0.6 0.5  PROT 4.7* 4.7*  ALBUMIN 2.0* 2.1*   No results for input(s): LIPASE, AMYLASE in the last 168 hours. No results for input(s): AMMONIA in the last 168 hours. CBC: Recent Labs  Lab 06/09/20 0435 06/09/20 0435 06/10/20 0453 06/11/20 0230 06/12/20 0230 06/12/20 1430 06-20-2020 0324  WBC 7.2  --  6.9 12.1* 14.4*  --  14.2*  NEUTROABS  --   --  0.3* 0.2* 0.3*  --  0.2*  HGB 7.7*   < > 7.0* 7.3* 5.9* 8.5* 7.8*  HCT 24.3*   < > 22.9* 23.9* 18.4* 25.9* 23.8*  MCV 92.4  --  94.6 94.8 93.4  --  90.8  PLT 36*  --  26* 23* 15*  --  10*   < > = values in this interval not displayed.   Cardiac Enzymes: No results for input(s): CKTOTAL, CKMB, CKMBINDEX, TROPONINI in the last 168 hours. Sepsis Labs: Recent Labs  Lab 06/10/20 0453 06/11/20 0230 06/12/20 0230 2020/06/20 0324  WBC 6.9 12.1* 14.4* 14.2*    Procedures/Operations  Intubation Central  line Arterial line   Bonna Gains Consandra Laske 06-20-2020, 2:22 PM

## 2020-07-06 NOTE — Progress Notes (Signed)
[facility] Progress Note Patient Name: Audrey Gross DOB: 03/10/1961 MRN: [id number]   Date of Service  July 05, 2020  HPI/Events of Note  Notified of patient going into afib RVR then bradycardia. Albuterol, D50/insulin and bicarb for hyperk already given. HR now 70s  eICU Interventions  Ordered calcium gluconate to stabilize membrane Repeat K pending     Intervention Category Major Interventions: Arrhythmia - evaluation and management  Judd Lien July 05, 2020, 5:36 AM

## 2020-07-06 NOTE — Progress Notes (Signed)
175 mL of fentanyl wasted into sink with Claudean Kinds, RN

## 2020-07-06 NOTE — Progress Notes (Signed)
[facility] Progress Note Patient Name: Audrey Gross DOB: 1961/04/12 MRN: [id number]   Date of Service  07-02-20  HPI/Events of Note  Notified of K 6.7 and platelets 10 No sign of active bleed, H/H 7.8/23.8 Noted ongoing discussion of goal so care  eICU Interventions  Hyperkalemia protocol ordered Would hold off on transfusion for now as with possible transition to comfort care. If with active bleed will give platelets     Intervention Category Major Interventions: Electrolyte abnormality - evaluation and management  Judd Lien Jul 02, 2020, 4:38 AM

## 2020-07-06 DEATH — deceased

## 2020-08-22 ENCOUNTER — Ambulatory Visit: Payer: BC Managed Care – PPO | Admitting: Obstetrics and Gynecology
